# Patient Record
Sex: Female | Born: 1935
Health system: Southern US, Community
[De-identification: ages and names within clinical notes are randomized; demographics above are authoritative.]

## PROBLEM LIST (undated history)

## (undated) DIAGNOSIS — K589 Irritable bowel syndrome without diarrhea: Secondary | ICD-10-CM

## (undated) DIAGNOSIS — N3281 Overactive bladder: Secondary | ICD-10-CM

## (undated) DIAGNOSIS — E785 Hyperlipidemia, unspecified: Secondary | ICD-10-CM

## (undated) DIAGNOSIS — J449 Chronic obstructive pulmonary disease, unspecified: Secondary | ICD-10-CM

## (undated) DIAGNOSIS — M5440 Lumbago with sciatica, unspecified side: Secondary | ICD-10-CM

## (undated) DIAGNOSIS — J189 Pneumonia, unspecified organism: Secondary | ICD-10-CM

## (undated) DIAGNOSIS — F419 Anxiety disorder, unspecified: Secondary | ICD-10-CM

## (undated) DIAGNOSIS — J8410 Pulmonary fibrosis, unspecified: Secondary | ICD-10-CM

## (undated) DIAGNOSIS — K219 Gastro-esophageal reflux disease without esophagitis: Secondary | ICD-10-CM

## (undated) DIAGNOSIS — I5032 Chronic diastolic (congestive) heart failure: Secondary | ICD-10-CM

## (undated) DIAGNOSIS — I509 Heart failure, unspecified: Secondary | ICD-10-CM

## (undated) HISTORY — DX: Overactive bladder: N32.81

## (undated) HISTORY — DX: Hyperlipidemia, unspecified: E78.5

## (undated) HISTORY — DX: Chronic diastolic (congestive) heart failure: I50.32

## (undated) HISTORY — DX: Lumbago with sciatica, unspecified side: M54.40

## (undated) HISTORY — DX: Chronic obstructive pulmonary disease, unspecified: J44.9

---
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
## 1999-01-05 DIAGNOSIS — Z8701 Personal history of pneumonia (recurrent): Secondary | ICD-10-CM | POA: Insufficient documentation

## 1999-01-05 DIAGNOSIS — E782 Mixed hyperlipidemia: Secondary | ICD-10-CM | POA: Insufficient documentation

## 2003-03-22 ENCOUNTER — Emergency Department (HOSPITAL_COMMUNITY): Admission: EM | Admit: 2003-03-22 | Discharge: 2003-03-23 | Payer: Self-pay | Admitting: Emergency Medicine

## 2003-03-28 ENCOUNTER — Ambulatory Visit (HOSPITAL_COMMUNITY): Admission: RE | Admit: 2003-03-28 | Discharge: 2003-03-28 | Payer: Self-pay | Admitting: Urology

## 2003-07-09 ENCOUNTER — Ambulatory Visit (HOSPITAL_COMMUNITY): Admission: RE | Admit: 2003-07-09 | Discharge: 2003-07-09 | Payer: Self-pay | Admitting: Urology

## 2004-07-18 ENCOUNTER — Emergency Department (HOSPITAL_COMMUNITY): Admission: EM | Admit: 2004-07-18 | Discharge: 2004-07-19 | Payer: Self-pay | Admitting: *Deleted

## 2006-01-12 ENCOUNTER — Emergency Department (HOSPITAL_COMMUNITY): Admission: EM | Admit: 2006-01-12 | Discharge: 2006-01-12 | Payer: Self-pay | Admitting: Emergency Medicine

## 2007-09-15 ENCOUNTER — Emergency Department (HOSPITAL_COMMUNITY): Admission: EM | Admit: 2007-09-15 | Discharge: 2007-09-15 | Payer: Self-pay | Admitting: Emergency Medicine

## 2008-02-12 ENCOUNTER — Ambulatory Visit (HOSPITAL_COMMUNITY): Admission: RE | Admit: 2008-02-12 | Discharge: 2008-02-12 | Payer: Self-pay | Admitting: Urology

## 2008-07-14 ENCOUNTER — Inpatient Hospital Stay (HOSPITAL_COMMUNITY): Admission: EM | Admit: 2008-07-14 | Discharge: 2008-07-16 | Payer: Self-pay | Admitting: Emergency Medicine

## 2008-07-14 ENCOUNTER — Ambulatory Visit: Payer: Self-pay | Admitting: Cardiology

## 2008-07-15 ENCOUNTER — Encounter (INDEPENDENT_AMBULATORY_CARE_PROVIDER_SITE_OTHER): Payer: Self-pay | Admitting: Internal Medicine

## 2008-08-22 ENCOUNTER — Ambulatory Visit (HOSPITAL_COMMUNITY): Admission: RE | Admit: 2008-08-22 | Discharge: 2008-08-22 | Payer: Self-pay | Admitting: Urology

## 2008-09-02 ENCOUNTER — Ambulatory Visit (HOSPITAL_COMMUNITY): Admission: RE | Admit: 2008-09-02 | Discharge: 2008-09-02 | Payer: Self-pay | Admitting: Pulmonary Disease

## 2008-12-02 ENCOUNTER — Ambulatory Visit (HOSPITAL_COMMUNITY): Admission: RE | Admit: 2008-12-02 | Discharge: 2008-12-02 | Payer: Self-pay | Admitting: Pulmonary Disease

## 2009-02-28 ENCOUNTER — Emergency Department (HOSPITAL_COMMUNITY): Admission: EM | Admit: 2009-02-28 | Discharge: 2009-02-28 | Payer: Self-pay | Admitting: Emergency Medicine

## 2009-03-23 ENCOUNTER — Emergency Department (HOSPITAL_COMMUNITY): Admission: EM | Admit: 2009-03-23 | Discharge: 2009-03-23 | Payer: Self-pay | Admitting: Emergency Medicine

## 2009-04-07 ENCOUNTER — Ambulatory Visit (HOSPITAL_COMMUNITY): Admission: RE | Admit: 2009-04-07 | Discharge: 2009-04-07 | Payer: Self-pay | Admitting: Pulmonary Disease

## 2009-04-12 ENCOUNTER — Inpatient Hospital Stay (HOSPITAL_COMMUNITY): Admission: EM | Admit: 2009-04-12 | Discharge: 2009-04-14 | Payer: Self-pay | Admitting: Emergency Medicine

## 2009-05-28 ENCOUNTER — Emergency Department (HOSPITAL_COMMUNITY): Admission: EM | Admit: 2009-05-28 | Discharge: 2009-05-28 | Payer: Self-pay | Admitting: Emergency Medicine

## 2009-09-26 ENCOUNTER — Emergency Department (HOSPITAL_COMMUNITY): Admission: EM | Admit: 2009-09-26 | Discharge: 2009-09-26 | Payer: Self-pay | Admitting: Emergency Medicine

## 2009-10-04 HISTORY — PX: COLONOSCOPY: SHX174

## 2009-10-09 ENCOUNTER — Ambulatory Visit: Payer: Self-pay | Admitting: Internal Medicine

## 2009-10-09 ENCOUNTER — Encounter (INDEPENDENT_AMBULATORY_CARE_PROVIDER_SITE_OTHER): Payer: Self-pay

## 2009-10-09 DIAGNOSIS — J40 Bronchitis, not specified as acute or chronic: Secondary | ICD-10-CM | POA: Insufficient documentation

## 2009-10-09 DIAGNOSIS — E785 Hyperlipidemia, unspecified: Secondary | ICD-10-CM

## 2009-10-09 DIAGNOSIS — M810 Age-related osteoporosis without current pathological fracture: Secondary | ICD-10-CM | POA: Insufficient documentation

## 2009-10-14 ENCOUNTER — Encounter: Payer: Self-pay | Admitting: Internal Medicine

## 2009-10-16 DIAGNOSIS — R1032 Left lower quadrant pain: Secondary | ICD-10-CM | POA: Insufficient documentation

## 2009-11-03 ENCOUNTER — Ambulatory Visit: Payer: Self-pay | Admitting: Internal Medicine

## 2009-11-03 ENCOUNTER — Ambulatory Visit (HOSPITAL_COMMUNITY): Admission: RE | Admit: 2009-11-03 | Discharge: 2009-11-03 | Payer: Self-pay | Admitting: Internal Medicine

## 2009-11-08 ENCOUNTER — Encounter: Payer: Self-pay | Admitting: Internal Medicine

## 2009-12-08 ENCOUNTER — Ambulatory Visit (HOSPITAL_COMMUNITY)
Admission: RE | Admit: 2009-12-08 | Discharge: 2009-12-08 | Payer: Self-pay | Source: Home / Self Care | Admitting: Pulmonary Disease

## 2010-01-25 ENCOUNTER — Encounter: Payer: Self-pay | Admitting: Emergency Medicine

## 2010-02-03 NOTE — Assessment & Plan Note (Signed)
Summary: ABD PAIN,HEPATITIS,CIRRHOSIS/SS   Visit Type:  Initial Consult Primary Care Eldredge Veldhuizen:  hawkins  Chief Complaint:  abd pain.  History of Present Illness: 76 y/o female with recent  bout of L sided abdomional pain. Seen in ED . Fairly extensive evaluation including CT. Nothing to explain symptpms found. Diverticulosis on CT. Asymmetrical thickening of cecum on CT. Hx of colonic polyps.  She did have renal cysts and a large hiatal hernia.  Now back to baseline. Personal hx of colonic polyps and a markedd positive family history of CRC in 3 first degree relatives. Last TCS 2007 - polyps - Was to have a repeat in 2010 but apparently not done.  Preventive Screening-Counseling & Management  Alcohol-Tobacco     Smoking Status: never  Current Problems (verified): 1)  Hyperlipidemia  (ICD-272.4) 2)  Osteoporosis  (ICD-733.00) 3)  Bronchitis  (ICD-490)  Current Medications (verified): 1)  Prednisone 5 Mg Tabs (Prednisone) .... 2 Once Daily 2)  Tramadol Hcl 50 Mg Tabs (Tramadol Hcl) .... As Needed 3)  Cetirizine Hcl 10 Mg Tabs (Cetirizine Hcl) .... Once Daily 4)  Simvastatin 40 Mg Tabs (Simvastatin) .... At Bedtime  Allergies (verified): 1)  ! Sulfa 2)  ! Codeine  Past History:  Past Medical History: osteoporosis bronchitis hyperlipidemia  Past Surgical History: none  Family History: Father: deceased Mother: deceased Siblings: 1 brother, 3 sisters No FH of Colon Cancer:  Social History: Marital Status: Married Children: 3 Occupation: retired Patient has never smoked.  Alcohol Use - no Smoking Status:  never  Physical Exam  General:  Well developed, well nourished, no acute distress. Lungs:  Clear throughout to auscultation. Heart:  Regular rate and rhythm; no murmurs, rubs,  or bruits. Abdomen:  nondistended positive bowel sounds soft nontender without appreciable mass or organomegaly Rectal:  deferred until time of colonoscopy  Impression &  Recommendations: Impression:  Recent acute, apparently self-limitiing illness as described above. no evidence for diverticulitis on recent CT. She's now back to baseline. Abnormality in cecum on CT. Personal history of colonic adenoma and srikijg positive family history of CRC.  Recommendations:  Diagnostic colonoscopy in the near future. Discuss the risks, benefits, limitations, alternatives and imponderables. Her questions were answered. She is agreeable.  Further recommendations to follow.  Appended Document: Orders Update    Clinical Lists Changes  Problems: Added new problem of ABDOMINAL PAIN, LEFT LOWER QUADRANT (ICD-789.04) Orders: Added new Service order of New Patient Level IV ([address]) - Signed

## 2010-02-03 NOTE — Letter (Signed)
Summary: TRIAGE ORDER  TRIAGE ORDER   Imported By: Ave Filter 10/14/2009 16:24:16  _____________________________________________________________________  External Attachment:    Type:   Image     Comment:   External Document

## 2010-02-03 NOTE — Miscellaneous (Signed)
Summary: ct, cmp, chest xray from [facility]  Clinical Lists Changes CT Abd/Pelvis W CM - STATUS: Final  IMAGE                                     Perform Date: 23Sep11 13:14  Ordered ByDeretha Emory MD , SCOTT          Ordered Date: 23Sep11 09:06  Facility: [facility]                               Department: CT  Service Report Text  [facility] Accession Number: [id number]      Clinical Data: Left sided abdominal pain    CT ABDOMEN AND PELVIS WITH CONTRAST    Technique:  Multidetector CT imaging of the abdomen and pelvis was   performed following the standard protocol during bolus   administration of intravenous contrast.    Contrast: 100 ml of Omnipaque-300    Comparison: 04/12/2009    Findings: Increased reticular markings are seen in the lingula and   right middle lobes.  The lung bases are otherwise clear.  The heart   is normal in size.    There is a 3 mm hypoattenuating lesion in the right hepatic lobe   which is too small to characterize although, likely benign.  This   gallbladder, spleen, adrenal glands and pancreas are within normal   limits.  The kidneys enhance symmetrically and there are several   sub centimeter hypoattenuating lesions bilaterally.  The largest is   located in the lower pole right kidney measuring 7 mm and   compatible with a simple cyst.  The remainder too small to   characterize.  Again seen is a large hiatal hernia.  There is no   small bowel obstruction. The appendix is normal.  There is mild   asymmetric bowel wall thickening seen in the cecum at the base of   the appendix of uncertain significance. There is diverticulosis of   the sigmoid colon without diverticulitis.    The uterus and ovaries are within normal limits.  The bladder is   unremarkable.  No free fluid is seen in the abdomen or pelvis.   There is no lymphadenopathy.  The aorta major branch vessels are   patent.    The bones are osteopenic.  No aggressive lytic or blastic bone   lesions are  seen.    IMPRESSION:    1.  No acute findings in the abdomen or pelvis.  There is   diverticulosis of the sigmoid colon without radiographic evidence   of diverticulitis.   2.  Low attenuation lesions in the kidneys, bilaterally the largest   of which are compatible with simple cyst in the smallest are too   small to characterize.   3.  Large hiatal hernia.   4.  Asymmetric thickening of the cecum at the base of the appendix.   No inflammatory changes are seen in the appendix.  Correlation   should be made with colonoscopy as this may relate to peristalsis   but underlying malignancy cannot be excluded.    Read By:  Henrene Pastor,  M.D.   Released By:  Henrene Pastor,  M.D.  Additional Information  HL7 RESULT STATUS : F  External image : [id number]  External IF Update Timestamp : 2009-09-26:13:27:30.000000    DG  Chest 2 View - STATUS: Final  IMAGE                                     Perform Date: 23Sep11 11:19  Ordered ByDeretha Emory MD , SCOTT          Ordered Date: 23Sep11 09:06  Facility: [facility]                               Department: DG  Service Report Text  [facility] Accession Number: [id number]      Clinical Data: Left-sided chest pain.    CHEST - 2 VIEW    Comparison: 04/12/2009    Findings: Trachea is midline.  Heart size normal.  Large hiatal   hernia.  Biapical pleural parenchymal scarring.  Mild chronic   changes are seen in the lungs.  No air space consolidation or   pleural fluid.    IMPRESSION:    1.  No acute findings.   2.  Large hiatal hernia.    Read By:  Reyes Ivan.,  M.D.   Released By:  Reyes Ivan.,  M.D.  Additional Information  HL7 RESULT STATUS : F  External image : [id number]  External IF Update Timestamp : 2009-09-26:11:24:04.000000     L-CMP (Comprehensive Metabolic Pnl-CMET) - STATUS: Final                                            Perform Date: 23Sep11 09:05  Ordered ByDeretha Emory MD , SCOTT           Ordered Date: 23Sep11 09:06                                       Last Updated Date: 23Sep11 09:42  Facility: [facility]                               Department: GENL  Accession #: [id number]                    USN:       [id number]  Findings  Result Name                              Result     Abnl   Normal Range     Units      Perf. Loc.  Sodium (NA)                              140               135-145          mEq/L  Potassium (K)                            3.6               3.5-5.1          mEq/L  Chloride  104               96-112           mEq/L  CO2                                           28                19-32            mEq/L  Glucose                                     93                70-99            mg/dL  BUN                                           8                 6-23             mg/dL  Creatinine                                   0.69              0.4-1.2          mg/dL  GFR, Est Non African American            >60               >60              mL/min  GFR, Est African American                >60               >60              mL/min    Oversized comment, see footnote  1  Bilirubin, Total                            0.4               0.3-1.2          mg/dL  Alkaline Phosphatase                  67                39-117           U/L  SGOT (AST)                               30                0-37             U/L  SGPT (ALT)  19                0-35             U/L  Total  Protein                             6.7               6.0-8.3          g/dL  Albumin-Blood                           4.2               3.5-5.2          g/dL  Calcium                                    9.3               8.4-10.5         mg/dL  Footnotes  1. The eGFR has been calculated     using the MDRD equation.     This calculation has not been     validated in all clinical     situations.     eGFR's  persistently     <60 mL/min signify     possible Chronic Kidney Disease.  Additional Information  HL7 RESULT STATUS : F  External IF Update Timestamp : 2009-09-26:09:38:00.000000

## 2010-02-03 NOTE — Letter (Signed)
Summary: Patient Notice, Colon Biopsy Results  [facility]  [address]   [place], [place] [address]   Phone: [phone]  Fax: [phone]       November 08, 2009   [name] [address] [place], [place]  [address] [birth year]    Dear Ms. Ganus,  I am pleased to inform you that the biopsies taken during your recent colonoscopy did not show any evidence of cancer upon pathologic examination.  Additional information/recommendations:  No further action is needed at this time.  Please follow-up with your primary care physician for your other healthcare needs.  You should have a repeat colonoscopy examination  in 2 years.  Please call us if you are having persistent problems or have questions about your condition that have not been fully answered at this time.  Sincerely,    R. Roetta Sessions MD, FACP [facility] Ph: [phone]    Fax: [phone]   Appended Document: Patient Notice, Colon Biopsy Results letter mailed to pt  Appended Document: Patient Notice, Colon Biopsy Results reminder in computer

## 2010-03-19 LAB — COMPREHENSIVE METABOLIC PANEL
ALT: 19 U/L (ref 0–35)
AST: 30 U/L (ref 0–37)
CO2: 28 mEq/L (ref 19–32)
Calcium: 9.3 mg/dL (ref 8.4–10.5)
GFR calc Af Amer: >60 mL/min (ref >60)
GFR calc non Af Amer: >60 mL/min (ref >60)
Sodium: 140 mEq/L (ref 135–145)
Total Protein: 6.7 g/dL (ref 6.0–8.3)

## 2010-03-19 LAB — POCT CARDIAC MARKERS: Troponin i, poc: <0.05 ng/mL (ref 0.00–0.09)

## 2010-03-23 LAB — COMPREHENSIVE METABOLIC PANEL
ALT: 16 U/L (ref 0–35)
Alkaline Phosphatase: 104 U/L (ref 39–117)
CO2: 29 mEq/L (ref 19–32)
GFR calc non Af Amer: >60 mL/min (ref >60)
Glucose, Bld: 100 mg/dL — ABNORMAL HIGH (ref 70–99)
Potassium: 3.3 mEq/L — ABNORMAL LOW (ref 3.5–5.1)
Sodium: 138 mEq/L (ref 135–145)

## 2010-03-23 LAB — CBC
Hemoglobin: 13 g/dL (ref 12.0–15.0)
RBC: 4.41 MIL/uL (ref 3.87–5.11)
WBC: 9 10*3/uL (ref 4.0–10.5)

## 2010-03-23 LAB — URINALYSIS, ROUTINE W REFLEX MICROSCOPIC
Nitrite: NEGATIVE
Specific Gravity, Urine: 1.005 (ref 1.005–1.030)
pH: 6.5 (ref 5.0–8.0)

## 2010-03-23 LAB — DIFFERENTIAL
Basophils Relative: 1 % (ref 0–1)
Eosinophils Absolute: 0.1 10*3/uL (ref 0.0–0.7)
Eosinophils Relative: 1 % (ref 0–5)
Monocytes Relative: 13 % — ABNORMAL HIGH (ref 3–12)
Neutrophils Relative %: 61 % (ref 43–77)

## 2010-03-23 LAB — POCT CARDIAC MARKERS

## 2010-03-23 LAB — LIPASE, BLOOD: Lipase: 36 U/L (ref 11–59)

## 2010-03-25 LAB — CBC
HCT: 33.8 % — ABNORMAL LOW (ref 36.0–46.0)
HCT: 36.3 % (ref 36.0–46.0)
Hemoglobin: 12.6 g/dL (ref 12.0–15.0)
MCHC: 34.3 g/dL (ref 30.0–36.0)
MCV: 85.1 fL (ref 78.0–100.0)
MCV: 86.1 fL (ref 78.0–100.0)
Platelets: 173 10*3/uL (ref 150–400)
Platelets: 206 10*3/uL (ref 150–400)
RBC: 3.93 MIL/uL (ref 3.87–5.11)
RDW: 13.4 % (ref 11.5–15.5)
WBC: 13.6 10*3/uL — ABNORMAL HIGH (ref 4.0–10.5)
WBC: 14.4 10*3/uL — ABNORMAL HIGH (ref 4.0–10.5)

## 2010-03-25 LAB — DIFFERENTIAL
Basophils Absolute: 0 10*3/uL (ref 0.0–0.1)
Basophils Absolute: 0 10*3/uL (ref 0.0–0.1)
Basophils Absolute: 0 10*3/uL (ref 0.0–0.1)
Basophils Relative: 0 % (ref 0–1)
Basophils Relative: 0 % (ref 0–1)
Basophils Relative: 0 % (ref 0–1)
Eosinophils Absolute: 0 10*3/uL (ref 0.0–0.7)
Lymphocytes Relative: 14 % (ref 12–46)
Lymphocytes Relative: 8 % — ABNORMAL LOW (ref 12–46)
Monocytes Absolute: 0.8 10*3/uL (ref 0.1–1.0)
Monocytes Relative: 6 % (ref 3–12)
Neutro Abs: 12.3 10*3/uL — ABNORMAL HIGH (ref 1.7–7.7)
Neutro Abs: 9.3 10*3/uL — ABNORMAL HIGH (ref 1.7–7.7)
Neutrophils Relative %: 77 % (ref 43–77)
Neutrophils Relative %: 86 % — ABNORMAL HIGH (ref 43–77)
Neutrophils Relative %: 90 % — ABNORMAL HIGH (ref 43–77)

## 2010-03-25 LAB — URINE CULTURE

## 2010-03-25 LAB — URINALYSIS, ROUTINE W REFLEX MICROSCOPIC
Glucose, UA: NEGATIVE mg/dL
Specific Gravity, Urine: 1.015 (ref 1.005–1.030)

## 2010-03-25 LAB — COMPREHENSIVE METABOLIC PANEL
Albumin: 3.7 g/dL (ref 3.5–5.2)
Alkaline Phosphatase: 120 U/L — ABNORMAL HIGH (ref 39–117)
BUN: 14 mg/dL (ref 6–23)
CO2: 26 mEq/L (ref 19–32)
Chloride: 102 mEq/L (ref 96–112)
Creatinine, Ser: 0.79 mg/dL (ref 0.4–1.2)
GFR calc non Af Amer: >60 mL/min (ref >60)
Glucose, Bld: 143 mg/dL — ABNORMAL HIGH (ref 70–99)
Total Bilirubin: 0.6 mg/dL (ref 0.3–1.2)

## 2010-03-25 LAB — URINE MICROSCOPIC-ADD ON

## 2010-03-25 LAB — PROTIME-INR
INR: 1.03 (ref 0.00–1.49)
Prothrombin Time: 13.4 seconds (ref 11.6–15.2)

## 2010-03-25 LAB — APTT: aPTT: 24 seconds (ref 24–37)

## 2010-03-25 LAB — LIPASE, BLOOD: Lipase: 50 U/L (ref 11–59)

## 2010-03-25 LAB — LACTIC ACID, PLASMA: Lactic Acid, Venous: 2.6 mmol/L — ABNORMAL HIGH (ref 0.5–2.2)

## 2010-03-29 LAB — GLUCOSE, CAPILLARY: Glucose-Capillary: 123 mg/dL — ABNORMAL HIGH (ref 70–99)

## 2010-04-12 LAB — POCT CARDIAC MARKERS: Myoglobin, poc: 45.2 ng/mL (ref 12–200)

## 2010-04-12 LAB — CBC
HCT: 35.6 % — ABNORMAL LOW (ref 36.0–46.0)
MCHC: 34.6 g/dL (ref 30.0–36.0)
MCHC: 34.8 g/dL (ref 30.0–36.0)
MCV: 85.7 fL (ref 78.0–100.0)
Platelets: 234 10*3/uL (ref 150–400)
RBC: 4.29 MIL/uL (ref 3.87–5.11)
RDW: 13.8 % (ref 11.5–15.5)
RDW: 13.9 % (ref 11.5–15.5)
RDW: 14 % (ref 11.5–15.5)
WBC: 5.5 10*3/uL (ref 4.0–10.5)

## 2010-04-12 LAB — BASIC METABOLIC PANEL
BUN: 7 mg/dL (ref 6–23)
BUN: 7 mg/dL (ref 6–23)
CO2: 28 mEq/L (ref 19–32)
Calcium: 9.1 mg/dL (ref 8.4–10.5)
Chloride: 103 mEq/L (ref 96–112)
Creatinine, Ser: 0.63 mg/dL (ref 0.4–1.2)
Creatinine, Ser: 0.82 mg/dL (ref 0.4–1.2)
GFR calc Af Amer: >60 mL/min (ref >60)
GFR calc non Af Amer: >60 mL/min (ref >60)
Glucose, Bld: 115 mg/dL — ABNORMAL HIGH (ref 70–99)
Potassium: 4.1 mEq/L (ref 3.5–5.1)

## 2010-04-12 LAB — BLOOD GAS, ARTERIAL
FIO2: 0.21 %
O2 Saturation: 88.8 %
Patient temperature: 37

## 2010-04-12 LAB — GLUCOSE, CAPILLARY

## 2010-04-12 LAB — URINALYSIS, ROUTINE W REFLEX MICROSCOPIC
Ketones, ur: NEGATIVE mg/dL
Leukocytes, UA: NEGATIVE
Nitrite: POSITIVE — AB
Protein, ur: NEGATIVE mg/dL
Urobilinogen, UA: 0.2 mg/dL (ref 0.0–1.0)

## 2010-04-12 LAB — BRAIN NATRIURETIC PEPTIDE: Pro B Natriuretic peptide (BNP): <30 pg/mL (ref 0.0–100.0)

## 2010-04-12 LAB — DIFFERENTIAL
Basophils Absolute: 0 10*3/uL (ref 0.0–0.1)
Basophils Relative: 0 % (ref 0–1)
Eosinophils Absolute: 0.2 10*3/uL (ref 0.0–0.7)
Eosinophils Relative: 0 % (ref 0–5)
Eosinophils Relative: 2 % (ref 0–5)
Lymphocytes Relative: 12 % (ref 12–46)
Lymphocytes Relative: 16 % (ref 12–46)
Lymphs Abs: 0.7 10*3/uL (ref 0.7–4.0)
Lymphs Abs: 1.3 10*3/uL (ref 0.7–4.0)
Monocytes Absolute: 0.1 10*3/uL (ref 0.1–1.0)
Monocytes Relative: 1 % — ABNORMAL LOW (ref 3–12)
Monocytes Relative: 12 % (ref 3–12)
Neutro Abs: 4.7 10*3/uL (ref 1.7–7.7)
Neutro Abs: 5.4 10*3/uL (ref 1.7–7.7)
Neutrophils Relative %: 68 % (ref 43–77)

## 2010-04-12 LAB — VITAMIN B12: Vitamin B-12: 226 pg/mL (ref 211–911)

## 2010-04-12 LAB — C-REACTIVE PROTEIN: CRP: 1 mg/dL — ABNORMAL HIGH (ref <0.6)

## 2010-04-12 LAB — CULTURE, BLOOD (ROUTINE X 2)
Culture: NO GROWTH
Culture: NO GROWTH
Report Status: 7162010

## 2010-04-12 LAB — URINE CULTURE: Colony Count: >=100000

## 2010-04-12 LAB — COMPREHENSIVE METABOLIC PANEL
ALT: 12 U/L (ref 0–35)
AST: 25 U/L (ref 0–37)
Calcium: 8.7 mg/dL (ref 8.4–10.5)
GFR calc Af Amer: >60 mL/min (ref >60)
Glucose, Bld: 95 mg/dL (ref 70–99)
Sodium: 139 mEq/L (ref 135–145)
Total Protein: 6 g/dL (ref 6.0–8.3)

## 2010-04-12 LAB — IRON AND TIBC
Saturation Ratios: 11 % — ABNORMAL LOW (ref 20–55)
UIBC: 269 ug/dL

## 2010-04-12 LAB — RETICULOCYTES
RBC.: 4.13 MIL/uL (ref 3.87–5.11)
Retic Ct Pct: 0.8 % (ref 0.4–3.1)

## 2010-04-12 LAB — FOLATE: Folate: 8.9 ng/mL

## 2010-04-12 LAB — SEDIMENTATION RATE: Sed Rate: 36 mm/hr — ABNORMAL HIGH (ref 0–22)

## 2010-04-12 LAB — FERRITIN: Ferritin: 70 ng/mL (ref 10–291)

## 2010-05-19 NOTE — Group Therapy Note (Signed)
[name], [name]                 ACCOUNT NO.:  [id number]   MEDICAL RECORD NO.:  [id number]          PATIENT TYPE:  INP   LOCATION:  A315                          FACILITY:  [facility]   PHYSICIAN:  Osvaldo Shipper, MD     DATE OF BIRTH:  02-11-1934   DATE OF PROCEDURE:  07/15/2008  DATE OF DISCHARGE:                                 PROGRESS NOTE   SUBJECTIVE:  The patient is feeling better.  She still has a dry cough.  No chest pain, no nausea or vomiting.  Shortness of breath is improving.   OBJECTIVE:  Temperature 98.2, blood pressure 98/60, heart rate 87,  respiratory rate 20, saturation 95% on 2 liters, 88% on room air at  rest.  GENERAL:  This is a thin white female in no distress, pleasant,  talkative.  HEENT:  No pallor, no icterus.  Mucous membranes moist.  NECK:  Soft and supple.  No thyromegaly is appreciated.  No  lymphadenopathy is noted.  LUNGS:  Coarse crackles bilaterally half-way up the lung fields.  No  wheezing is appreciated.  CARDIOVASCULAR:  S1 and S2 is normal, regular.  No murmurs.  No rubs, no  bruits.  No pedal edema.  ABDOMEN:  Soft, nontender, nondistended.  Bowel sounds present.  No  masses or organomegaly are appreciated.   LABORATORY DATA:  Her white count is normal, hemoglobin 11.4, MCV 86,  platelet count is normal.  ESR is 36.  LFTs are normal.  Albumin is 2.8.  ABG done on room air yesterday showed a pH of 7.41, PCO2 is 36, PO2 is  55, bicarb is 23, saturation 88%.   IMAGING STUDIES:  CT of the chest is pending.  Pulmonary function tests  results are pending.   ASSESSMENT AND PLAN:  1. Shortness of breath secondary to pulmonary fibrosis/interstitial      lung disease.  The etiology for this is likely the nitrofurantoin      that she has been taking for the past 2 months.  She has been seen      by Dr. Juanetta Gosling.  She has been started on Solu-Medrol by Dr.      Juanetta Gosling.  A CT chest is pending.  Pulmonary function tests have      been done, but the  results are pending.  Room air sats at rest and      with ambulation will be checked, as it is quite likely that this      patient will need home O2.  2. History of bronchial asthma.  Continue with Flovent and Singulair      and nebulizer treatment every 6 hours.  3. She is on deep venous thrombosis prophylaxis.  4. Urinary tract infection.  Treat with Levaquin.  5. Presumed pneumonia.  Treat with Levaquin.  6. Cardiomegaly.  Echocardiogram is pending.   So we will await the results of all of these tests.  We will continue  with current treatment strategy.  I am hoping this patient will be able  to go home in the next day or two.  Osvaldo Shipper, MD  Electronically Signed     GK/MEDQ  D:  07/15/2008  T:  07/15/2008  Job:  [id number]   cc:   Ramon Dredge L. Juanetta Gosling, M.D.  Fax: [phone]   [facility]

## 2010-05-19 NOTE — Consult Note (Signed)
NAMEKINSLIE, HOVE                 ACCOUNT NO.:  [id number]   MEDICAL RECORD NO.:  [id number]          PATIENT TYPE:  INP   LOCATION:  A315                          [facility]ILITY[facility]  [facility]   PHYSICIAN:  Edward L. Juanetta Gosling, M.D.DATE OF BIRTH:  1934-09-08   DATE OF CONSULTATION:  DATE OF DISCHARGE:                                 CONSULTATION   Ms. [name] is admitted with a history of asthma.  She has become much  more short of breath in the last 2-3 days and eventually came to the  emergency room.  When she was seen in the emergency room, she was noted  to have abnormalities on her chest x-ray that are new from a previous  chest x-ray.  She had been started on nitrofurantoin about 2 months ago.  At home, she takes Zyrtec 10 mg daily.  She has been taking  nitrofurantoin, Singulair 10 mg daily, Flovent 110 twice daily, and then  Ventolin as needed.   PAST MEDICAL HISTORY:  She has had asthma, and that is, basically, her  only medical problem.   SOCIAL HISTORY:  She does not use alcohol or tobacco or illicit drugs.  She lives at home with family.   REVIEW OF SYSTEMS:  Except as mentioned, she has had some cough.   FAMILY HISTORY:  Not positive for any lung disease as far as she knows.   PHYSICAL EXAMINATION:  GENERAL:  She is a well-developed, well-nourished  female who does not appear to be in any acute distress.  VITAL SIGNS:  Temp is 98.2, pulse 87, respirations 24, blood pressure  94/65.  O2 sat is 95% on 2 L.  CHEST:  Clearer.  HEART:  Regular.  ABDOMEN:  Soft.  EXTREMITIES:  No edema.   LABORATORY DATA:  Comp metabolic profile shows albumin of 2.8, white  count 7,900.  Hemoglobin is 11.4, platelets 187.  Blood gas on room air,  pH 7.41, pCO2 of 36, and pO2 of 55.   Chest x-ray shows underlying interstitial lung disease, which is  different than an x-ray in 2008.   ASSESSMENT:  I think that Dr. 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  I agree with  plans for CT of the  chest.  He and I discussed steroids, and I think we should go ahead and  do that.     Edward L. Juanetta Gosling, M.D.  Electronically Signed    ELH/MEDQ  D:  07/15/2008  T:  07/16/2008  Job:  [id number]

## 2010-05-19 NOTE — Group Therapy Note (Signed)
NAMEZYRA, PARRILLO                 ACCOUNT NO.:  [id number]   MEDICAL RECORD NO.:  [id number]          PATIENT TYPE:  INP   LOCATION:  A315                          [facility]ACILITY:  [facility]   PHYSICIAN:  Edward L. Juanetta Gosling, M.D.DATE OF BIRTH:  [birth year]   DATE OF PROCEDURE:  DATE OF DISCHARGE:  07/16/2008                                 PROGRESS NOTE   A patient of the hospitalist.  Ms. Talamante has been admitted with what  appears to be Macrodantin lung injury.  She is on steroids.  I have  discussed all that with Dr. Rito Ehrlich, the hospitalist attending, and we  know that there is not a great deal of evidence about how well that  works, but it is available and she is early in the course of this, we  think, with being on the Macrodantin for about 2 months, so our hope is  that she will have a reversible situation.  Dr. Rito Ehrlich plans to send  her home today, and I have discussed that with him and my plan then  would be to have him put her on 40 mg of prednisone daily for about 2  weeks, then 30 for about 2 weeks, and then return and follow up in my  office if that is okay with her, at which point we will do another chest  x-ray and see if things look better.      Edward L. Juanetta Gosling, M.D.  Electronically Signed     ELH/MEDQ  D:  07/16/2008  T:  07/17/2008  Job:  [id number]

## 2010-05-19 NOTE — H&P (Signed)
NAMEMINDEL, FRISCIA                 ACCOUNT NO.:  [id number]   MEDICAL RECORD NO.:  [id number]          PATIENT TYPE:  INP   LOCATION:  A315                          [facility]   PHYSICIAN:  Osvaldo Shipper, MD     DATE OF BIRTH:  06-Jun-1934   DATE OF ADMISSION:  07/14/2008  DATE OF DISCHARGE:  LH                              HISTORY & PHYSICAL   PRIMARY CARE PHYSICIAN:  Dr. Shelva Majestic in [facility] [place]  [place].   She has never seen a pulmonologist before.   ADMISSION DIAGNOSES:  1. Hypoxia.  2. Likely interstitial lung disease/pulmonary fibrosis due to      nitrofurantoin.  3. Possible pneumonia.  4. Urinary tract infection.  5. History of bronchial asthma.   CHIEF COMPLAINT:  Shortness of breath since yesterday.   HISTORY OF PRESENT ILLNESS:  The patient is a 76 year old Caucasian  female who has a past medical history of bronchial asthma who says she  has been short of breath for many, many years, but over the last couple  of days she has been feeling more short of breath, especially with  exertion, although the shortness of breath does persist even at rest.  She has a dry cough.  Denies any chest pain, fever, or chills.  No  nausea or vomiting.  No sick contacts.  No recent travel.  No leg  swelling.  She says that the only new medication was the nitrofurantoin  that she started approximately 2 months ago.   HOME MEDICATIONS:  1. Cetirizine 10 mg daily.  2. Nitrofurantoin 100 mg at bedtime.  3. Singulair 10 mg daily.  4. Flovent 110 mcg twice daily.  5. Ventolin 2 puffs inhaled as needed.   ALLERGIES:  SULFA and CODEINE.   PAST MEDICAL HISTORY:  1. Bronchial asthma for many years.  2. Otherwise denies any history of hypertension, diabetes, heart      disease, or stroke.   PAST SURGICAL HISTORY:  Denies any surgeries in the past.   SOCIAL HISTORY:  Lives in [place] with her husband.  Denies alcohol  use.  Denies illicit  drug use.  Denies smoking.  Independent with daily  activities.   FAMILY HISTORY:  Unremarkable per the patient.   REVIEW OF SYSTEMS:  GENERAL:  Positive for weakness, malaise.  HEENT:  Unremarkable.  CARDIOVASCULAR:  Unremarkable.  RESPIRATORY:  As in HPI.  GI:  Unremarkable.  GU:  Unremarkable.  NEUROLOGIC:  Unremarkable.  PSYCHIATRIC:  Unremarkable.  MUSCULOSKELETAL:  Unremarkable.  Other  systems are unremarkable.   PHYSICAL EXAMINATION:  VITAL SIGNS:  Temperature 97.7, blood pressure  initially was 129/75 dropping to 98/53 with heart rate maintaining in  the 80s.  Respiratory rate 20.  Saturation 99% on 2 liters and 84% on  room air.  GENERAL:  Thin white female in no distress.  HEENT:  Head is normocephalic and atraumatic.  No pallor or icterus.  Oral mucous membranes are moist.  Poor oral dentition is noted.  Multiple teeth are missing.  NECK:  Soft and supple.  No  thyromegaly is appreciated.  No  lymphadenopathy is noted.  LUNGS:  Coarse crackles bilaterally halfway up the lung fields.  No  wheezing is present.  CARDIOVASCULAR:  S1, S2 normal, regular.  No murmur is appreciated.  No  S3, S4.  No rubs and no bruits.  No edema.  Peripheral pulses are  palpable.  ABDOMEN:  Soft, nontender, nondistended.  Bowel sounds are present.  No  masses or organomegaly appreciated.  GU:  Deferred.  RECTAL:  Deferred.  MUSCULOSKELETAL:  Unremarkable.  NEUROLOGIC:  She is alert and oriented x3.  No focal neurological  deficits are present.   LABORATORY DATA:  CBC unremarkable.  BMET unremarkable.  Cardiac enzymes  negative.  BNP less than 30.  UA shows positive nitrite, many bacteria,  3-6 WBCs.   Chest x-ray was read as showing cardiomegaly with underlying chronic  interstitial changes.  Bibasilar interstitial opacities are noted, left  greater than right, and a degree of basilar pneumonia could not be  excluded.  Interestingly she had a chest x-ray in January 2009 in which  the  lungs were clear.   EKG shows sinus rhythm at a rate of 83 with a normal axis.  Intervals  appear to be in the normal range.  No Q-waves are noted.  No ST or T-  wave changes of concern are noted.   ASSESSMENT:  This is a 76 year old Caucasian female who presents with  shortness of breath for a few days duration who is found to have  interstitial changes on her chest x-ray, especially in the bases.  There  is a possibility of pneumonia also.  However, with a normal white count  I think that possibility is quite low.  She also has a urinary tract  infection.  I think most of her shortness of breath is secondary to  interstitial changes in her lungs, and this is probably caused by the  nitrofurantoin that she had been taking for the past 2 months for  urinary issues.   PLAN:  1. Dyspnea is likely due to pulmonary fibrosis/interstitial lung      disease secondary to nitrofurantoin.  We will admit the patient,      put her on nebulizer treatments, continue with inhaled steroids.  I      think we need to go ahead and get a CT chest without and with      contrast.  Check arterial blood gases, do a pulmonary function      test, and consult Dr. Juanetta Gosling to take a look at her.  She may      benefit from steroids; however, I will defer to him on this issue.      She is stable at this time.  2. Presumed pneumonia.  Will treat with Levaquin which will also treat      the urinary tract infection.  3. History of bronchial asthma.  Continue with Singulair.  4. Cardiomegaly.  We will also check an echocardiogram.  Her beta-      natriuretic peptide, however, was normal.  5. Deep venous thrombosis prophylaxis will be initiated.   Further management decisions will depend on the results of further  testing and the patient's response to treatment.      Osvaldo Shipper, MD  Electronically Signed     GK/MEDQ  D:  07/14/2008  T:  07/14/2008  Job:  [id number]   cc:   Dr. Juanetta Gosling   Dr. Claris Che  [facility]

## 2010-05-19 NOTE — Discharge Summary (Signed)
[name], [name]                 ACCOUNT NO.:  [id number]   MEDICAL RECORD NO.:  [id number]          PATIENT TYPE:  INP   LOCATION:  A315                          FACILITY:  [facility]   PHYSICIAN:  Osvaldo Shipper, MD     DATE OF BIRTH:  1934-11-13   DATE OF ADMISSION:  07/14/2008  DATE OF DISCHARGE:  07/13/2010LH                               DISCHARGE SUMMARY   PRIMARY CARE PHYSICIAN:  Minerva Fester, MD, [facility]   HISTORY/PHYSICAL:  Please review H and P dictated at the time of  admission for details regarding the patient's presenting illness.   DISCHARGE DIAGNOSES:  1. Pulmonary fibrosis likely secondary to nitrofurantoin.  2. Hypoxia, requiring home oxygen.  3. Urinary tract infection with Klebsiella sensitive to Levaquin.  4. History of bronchial asthma.   HOSPITAL COURSE:  1. Briefly, this is a 76 year old Caucasian female who apparently has      issues with chronic UTIs and was started on nitrofurantoin about 2      months ago.  She presented to the hospital because of progressively      worsening shortness of breath.  She was found to be extremely      hypoxic in the ED especially with exertion and her sats would drop      to 83.  The patient was admitted for further evaluation.  X-ray was      read as showing chronic interstitial changes.  However, an x-ray      done in 2008 did not show any kind of interstitial lung disease.      It was felt that she had developed nitrofurantoin-induced pulmonary      toxicity.  So we admitted the patient to the hospital.  We started      her on IV steroids and consulted Dr. Juanetta Gosling who agreed with the      assessment.  A CT chest was done which showed patchy ground-glass      opacities favoring the lower lobes and peripherally with      interspersed interstitial thickening.  Mild mediastinal adenopathy      was noted which was thought to be nonspecific, moderate size hiatal      hernia was seen and distal esophageal wall  thickening was noted.      The patient was put on O2 as well.  She has been feeling better.      She was given nebulizer treatments as well.  ABG was checked which      showed a pH of 7.41, PCO2 was normal, PO2 was 55, saturation 88%      this was on room air while sitting.  ESR was 36.  Rest of the labs      were pretty unremarkable.  BNP was less than 30.  C. reactive      protein was 1.0.  2. Urinary tract infection.  She was found to have positive nitrite      and many bacteria in her urine.  UA was done which showed      Klebsiella greater than 100,000  colonies sensitive to Levaquin and      she will be prescribed this for 5 more days.  3. She otherwise has bronchial asthma for which she can continue with      Singulair, Flovent and Ventolin inhaler as needed.   PHYSICAL EXAMINATION:  GENERAL:  On the day of discharge, the patient is  feeling better.  She is still short of breath with ambulation.  Sats  yesterday when they were checked were 86% on room air.  VITAL SIGNS:  Otherwise all stable.  LUNGS:  Reveal crackles in the lower part of the lungs, but they sound  better compared to a couple of days ago.  No wheezing is present.  CARDIOVASCULAR:  S1 and S2 normal, regular.  No murmurs appreciated.  No  S3-S4, no rubs, no bruits.  ABDOMEN:  Soft, nontender, nondistended.  Bowel sounds were present.  No  masses or organomegaly is appreciated.  EXTREMITIES:  No pedal edema is noted.   OTHER PERTINENT STUDIES:  1. Include an echocardiogram which showed a normal EF 60-65% with no      wall motion abnormalities.  Mild focal hypertrophy of the basilar      septum was noted.  2. She also had pulmonary function tests which has not been officially      read yet, but preliminarily it does show evidence for severe      obstructive disease and also there was a restrictive pattern noted.  3. Other studies include an anemia panel which is pending at this      time.   DISCHARGE  MEDICATIONS:  1. Levaquin 500 mg daily for 5 days.  2. Prednisone 60 mg daily for 2 weeks; followed by 40 mg daily for 2      weeks and then 20 mg daily until seen by Dr. Juanetta Gosling.  3. Continue with cetirizine 10 mg daily; Singulair 10 mg daily;      Flovent inhaled twice daily; Ventolin inhaler as needed.  4. She needs to discontinue nitrofurantoin, I have told her not to      take it ever again.   Home oxygen will be arranged.   FOLLOW UP:  1. Follow up with Dr. Juanetta Gosling 4 weeks.  2. PMD within a week.  3. She also is followed by Dr. Kristian Covey for unclear reasons and she      will continue to do that.   DIET:  As before.   PHYSICAL ACTIVITY:  Increase activity slowly.  Home health PT will be  arranged.  Home O2 will be arranged.   DIAGNOSTICS:  CT chest and chest x-ray as described above.  Other  studies include 2-D echo as described above.   CONSULTATIONS:  Edward L. Juanetta Gosling, M.D.   Total time of this encounter 35 minutes.      Osvaldo Shipper, MD  Electronically Signed    GK/MEDQ  D:  07/16/2008  T:  07/16/2008  Job:  [id number]   cc:   Ramon Dredge L. Juanetta Gosling, M.D.  Fax: [phone]   [facility]

## 2010-05-22 NOTE — Procedures (Signed)
[name], [name]                 ACCOUNT NO.:  [id number]   MEDICAL RECORD NO.:  [id number]          PATIENT TYPE:  INP   LOCATION:  A315                          FACILITY:  [facility]   PHYSICIAN:  Edward L. Juanetta Gosling, M.D.DATE OF BIRTH:  [birth year]   DATE OF PROCEDURE:  DATE OF DISCHARGE:  07/16/2008                            PULMONARY FUNCTION TEST   1. Spirometry shows a severe ventilatory defect with airflow      obstruction.  2. Lung volumes show a separate restrictive change.  3. There is no significant bronchodilator improvement.      Edward L. Juanetta Gosling, M.D.  Electronically Signed     ELH/MEDQ  D:  07/18/2008  T:  07/18/2008  Job:  [id number]   cc:   Osvaldo Shipper, MD

## 2011-01-19 ENCOUNTER — Emergency Department (HOSPITAL_COMMUNITY)
Admission: EM | Admit: 2011-01-19 | Discharge: 2011-01-19 | Disposition: A | Discharge status: Home / Self Care | Payer: Medicare Other | Attending: Emergency Medicine | Admitting: Emergency Medicine

## 2011-01-19 ENCOUNTER — Encounter (HOSPITAL_COMMUNITY): Payer: Self-pay | Admitting: *Deleted

## 2011-01-19 ENCOUNTER — Emergency Department (HOSPITAL_COMMUNITY): Payer: Medicare Other

## 2011-01-19 DIAGNOSIS — J984 Other disorders of lung: Secondary | ICD-10-CM | POA: Insufficient documentation

## 2011-01-19 DIAGNOSIS — K449 Diaphragmatic hernia without obstruction or gangrene: Secondary | ICD-10-CM | POA: Diagnosis not present

## 2011-01-19 DIAGNOSIS — K5289 Other specified noninfective gastroenteritis and colitis: Secondary | ICD-10-CM | POA: Diagnosis not present

## 2011-01-19 DIAGNOSIS — R197 Diarrhea, unspecified: Secondary | ICD-10-CM | POA: Diagnosis not present

## 2011-01-19 DIAGNOSIS — K529 Noninfective gastroenteritis and colitis, unspecified: Secondary | ICD-10-CM

## 2011-01-19 LAB — COMPREHENSIVE METABOLIC PANEL
Albumin: 4 g/dL (ref 3.5–5.2)
BUN: 8 mg/dL (ref 6–23)
CO2: 32 mEq/L (ref 19–32)
Calcium: 10 mg/dL (ref 8.4–10.5)
Chloride: 102 mEq/L (ref 96–112)
Creatinine, Ser: 0.64 mg/dL (ref 0.50–1.10)
GFR calc non Af Amer: 86 mL/min — ABNORMAL LOW (ref >90)
Total Bilirubin: 0.4 mg/dL (ref 0.3–1.2)

## 2011-01-19 LAB — DIFFERENTIAL
Basophils Relative: 0 % (ref 0–1)
Eosinophils Relative: 0 % (ref 0–5)
Monocytes Absolute: 1.1 10*3/uL — ABNORMAL HIGH (ref 0.1–1.0)
Monocytes Relative: 14 % — ABNORMAL HIGH (ref 3–12)
Neutro Abs: 5.2 10*3/uL (ref 1.7–7.7)

## 2011-01-19 LAB — CBC
HCT: 35.7 % — ABNORMAL LOW (ref 36.0–46.0)
Hemoglobin: 12 g/dL (ref 12.0–15.0)
MCHC: 33.6 g/dL (ref 30.0–36.0)
MCV: 85.6 fL (ref 78.0–100.0)

## 2011-01-19 MED ORDER — KETOROLAC TROMETHAMINE 30 MG/ML IJ SOLN
15.0000 mg | Freq: Once | INTRAMUSCULAR | Status: AC
  Administered 2011-01-19: 15 mg via INTRAVENOUS
  Filled 2011-01-19: qty 1

## 2011-01-19 MED ORDER — ONDANSETRON 4 MG PO TBDP: 4.0000 mg | ORAL_TABLET | Freq: Three times a day (TID) | ORAL | Status: AC | PRN

## 2011-01-19 MED ORDER — ONDANSETRON HCL 4 MG/2ML IJ SOLN
4.0000 mg | Freq: Once | INTRAMUSCULAR | Status: AC
  Administered 2011-01-19: 4 mg via INTRAVENOUS
  Filled 2011-01-19: qty 2

## 2011-01-19 MED ORDER — SODIUM CHLORIDE 0.9 % IV BOLUS (SEPSIS)
1000.0000 mL | Freq: Once | INTRAVENOUS | Status: AC
  Administered 2011-01-19: 1000 mL via INTRAVENOUS

## 2011-01-19 MED ORDER — OXYCODONE-ACETAMINOPHEN 5-325 MG PO TABS
1.0000 | ORAL_TABLET | Freq: Once | ORAL | Status: AC
  Administered 2011-01-19: 1 via ORAL
  Filled 2011-01-19: qty 1

## 2011-01-19 NOTE — ED Notes (Signed)
C/o n/v/d, right-sided HA pain onset yesterday. A&ox4; answers questions appropriately.

## 2011-01-19 NOTE — ED Notes (Signed)
Left in c/o family for transport home.  A&ox4; in no distress.

## 2011-01-19 NOTE — ED Notes (Signed)
C/o chills, HA and N/V since yesterday, admits to recent sick contacts as well

## 2011-01-19 NOTE — ED Notes (Signed)
Patient transported to X-ray 

## 2011-01-19 NOTE — ED Provider Notes (Signed)
This chart was scribed for Benny Lennert, MD by Williemae Natter. The patient was seen in room [facility]09[facility]09 at 12:47 PM.  CSN: [id number]  Arrival date & time 01/19/11  1216   First MD Initiated Contact with Patient 01/19/11 1241      Chief Complaint  Patient presents with  . Nausea  . Chills  . Emesis    (Consider location/radiation/quality/duration/timing/severity/associated sxs/prior treatment) Patient is a 77 y.o. female presenting with vomiting. The history is provided by the patient. No language interpreter was used.  Emesis  This is a new problem. The current episode started yesterday. The problem has not changed since onset.The emesis has an appearance of stomach contents. There has been no fever. Pertinent negatives include no abdominal pain, no cough, no diarrhea and no headaches. Risk factors include ill contacts.   Pt has associated diarrhea, feels sick to stomach, has chills and is vomiting up clear liquid. Pt states that she has a throbbing pain in one side of her face. PCP-Dr. Juanetta Gosling No past medical history on file.  No past surgical history on file.  No family history on file.  History  Substance Use Topics  . Smoking status: Never Smoker   . Smokeless tobacco: Not on file  . Alcohol Use: No    OB History    Grav Para Term Preterm Abortions TAB SAB Ect Mult Living                  Review of Systems  Constitutional: Negative for fatigue.  HENT: Negative for congestion, sinus pressure and ear discharge.   Eyes: Negative for discharge.  Respiratory: Negative for cough.   Cardiovascular: Negative for chest pain.  Gastrointestinal: Positive for vomiting. Negative for abdominal pain and diarrhea.  Genitourinary: Negative for frequency and hematuria.  Musculoskeletal: Negative for back pain.  Skin: Negative for rash.  Neurological: Negative for seizures and headaches.  Hematological: Negative.   Psychiatric/Behavioral: Negative for hallucinations.     Allergies  Sulfonamide derivatives and Codeine  Home Medications   Current Outpatient Rx  Name Route Sig Dispense Refill  . AZITHROMYCIN 250 MG PO TABS Oral Take 250-500 mg by mouth daily. Take 2 tablets by mouth on day 1 then take one tablet by mouth for days 2-5    . CETIRIZINE HCL 10 MG PO TABS Oral Take 10 mg by mouth daily.    Marland Kitchen HYDROCODONE-ACETAMINOPHEN 5-500 MG PO TABS Oral Take 1 tablet by mouth every 6 (six) hours as needed. Pain    . SIMVASTATIN 40 MG PO TABS Oral Take 40 mg by mouth every evening.    Marland Kitchen ONDANSETRON 4 MG PO TBDP Oral Take 1 tablet (4 mg total) by mouth every 8 (eight) hours as needed for nausea. 20 tablet 0    BP 132/90  Pulse 82  Temp(Src) 97.5 F (36.4 C) (Oral)  Resp 20  Ht 5\' 9"  (1.753 m)  Wt 134 lb (60.782 kg)  BMI 19.79 kg/m2  SpO2 100%  Physical Exam  Nursing note and vitals reviewed. Constitutional: She is oriented to person, place, and time. She appears well-developed.  HENT:  Head: Normocephalic and atraumatic.       Dry mucous membranes  Eyes: Conjunctivae and EOM are normal. No scleral icterus.  Neck: Neck supple. No thyromegaly present.  Cardiovascular: Normal rate and regular rhythm.  Exam reveals no gallop and no friction rub.   No murmur heard. Pulmonary/Chest: No stridor. She has no wheezes. She has no rales. She exhibits  no tenderness.  Abdominal: She exhibits no distension. There is tenderness. There is no rebound.       Minimally tender in left upper quadrant  Musculoskeletal: Normal range of motion. She exhibits no edema.  Lymphadenopathy:    She has no cervical adenopathy.  Neurological: She is oriented to person, place, and time. Coordination normal.  Skin: No rash noted. No erythema.  Psychiatric: She has a normal mood and affect. Her behavior is normal.    ED Course  Procedures (including critical care time)  Labs Reviewed  CBC - Abnormal; Notable for the following:    HCT 35.7 (*)    All other components  within normal limits  DIFFERENTIAL - Abnormal; Notable for the following:    Monocytes Relative 14 (*)    Monocytes Absolute 1.1 (*)    All other components within normal limits  COMPREHENSIVE METABOLIC PANEL - Abnormal; Notable for the following:    Glucose, Bld 114 (*)    GFR calc non Af Amer 86 (*)    All other components within normal limits   Dg Abd Acute W/chest  01/19/2011  *RADIOLOGY REPORT*  Clinical Data: Diarrhea  ACUTE ABDOMEN SERIES (ABDOMEN 2 VIEW & CHEST 1 VIEW)  Comparison: Abdominal radiographs 05/28/2009, chest radiograph 12/08/2009  Findings: Upper-normal size of cardiac silhouette. Tortuous aorta. Moderate sized hiatal hernia. Mediastinal contours and pulmonary vascularity normal. Ovoid nodular density right upper lobe, 7 x 5 mm, not seen on the two most recent previous exams. Bronchitic changes without infiltrate or effusion. No pneumothorax. Bones severely demineralized.  Distended bladder. Pelvic phleboliths. Nonobstructive bowel gas pattern. No bowel dilatation, bowel wall thickening, or free intraperitoneal air. No urinary tract calcification.  IMPRESSION: Moderate sized hiatal hernia. Bronchitic changes. Questionable right upper lobe nodule 7 x 5 mm; recommend CT chest to exclude developing pulmonary nodule. No acute abdominal findings.  Original Report Authenticated By: Lollie Marrow, M.D.     1. Gastroenteritis     Pt improved with tx.  She stated dr. Juanetta Gosling is following her lung lesions  MDM    The chart was scribed for me under my direct supervision.  I personally performed the history, physical, and medical decision making and all procedures in the evaluation of this patient.Benny Lennert, MD 01/19/11 [phone]

## 2011-02-17 ENCOUNTER — Ambulatory Visit (HOSPITAL_COMMUNITY)
Admission: RE | Admit: 2011-02-17 | Discharge: 2011-02-17 | Disposition: A | Discharge status: Home / Self Care | Payer: Medicare Other | Source: Ambulatory Visit | Attending: Pulmonary Disease | Admitting: Pulmonary Disease

## 2011-02-17 ENCOUNTER — Other Ambulatory Visit (HOSPITAL_COMMUNITY): Payer: Self-pay | Admitting: Pulmonary Disease

## 2011-02-17 DIAGNOSIS — R911 Solitary pulmonary nodule: Secondary | ICD-10-CM | POA: Diagnosis not present

## 2011-02-17 DIAGNOSIS — J4489 Other specified chronic obstructive pulmonary disease: Secondary | ICD-10-CM | POA: Insufficient documentation

## 2011-02-17 DIAGNOSIS — K449 Diaphragmatic hernia without obstruction or gangrene: Secondary | ICD-10-CM | POA: Diagnosis not present

## 2011-02-17 DIAGNOSIS — J984 Other disorders of lung: Secondary | ICD-10-CM | POA: Diagnosis not present

## 2011-02-17 DIAGNOSIS — J449 Chronic obstructive pulmonary disease, unspecified: Secondary | ICD-10-CM | POA: Diagnosis not present

## 2011-02-17 DIAGNOSIS — G4700 Insomnia, unspecified: Secondary | ICD-10-CM | POA: Diagnosis not present

## 2011-03-04 DIAGNOSIS — M76899 Other specified enthesopathies of unspecified lower limb, excluding foot: Secondary | ICD-10-CM | POA: Diagnosis not present

## 2011-03-04 DIAGNOSIS — M545 Low back pain: Secondary | ICD-10-CM | POA: Diagnosis not present

## 2011-03-05 ENCOUNTER — Other Ambulatory Visit: Payer: Self-pay

## 2011-03-05 ENCOUNTER — Emergency Department (HOSPITAL_COMMUNITY): Payer: Medicare Other

## 2011-03-05 ENCOUNTER — Emergency Department (HOSPITAL_COMMUNITY)
Admission: EM | Admit: 2011-03-05 | Discharge: 2011-03-05 | Disposition: A | Discharge status: Home Health | Payer: Medicare Other | Attending: Emergency Medicine | Admitting: Emergency Medicine

## 2011-03-05 ENCOUNTER — Encounter (HOSPITAL_COMMUNITY): Payer: Self-pay | Admitting: Emergency Medicine

## 2011-03-05 DIAGNOSIS — Z79899 Other long term (current) drug therapy: Secondary | ICD-10-CM | POA: Diagnosis not present

## 2011-03-05 DIAGNOSIS — M810 Age-related osteoporosis without current pathological fracture: Secondary | ICD-10-CM | POA: Insufficient documentation

## 2011-03-05 DIAGNOSIS — K219 Gastro-esophageal reflux disease without esophagitis: Secondary | ICD-10-CM | POA: Insufficient documentation

## 2011-03-05 DIAGNOSIS — M545 Low back pain, unspecified: Secondary | ICD-10-CM | POA: Diagnosis not present

## 2011-03-05 DIAGNOSIS — N390 Urinary tract infection, site not specified: Secondary | ICD-10-CM | POA: Diagnosis not present

## 2011-03-05 DIAGNOSIS — J984 Other disorders of lung: Secondary | ICD-10-CM | POA: Diagnosis not present

## 2011-03-05 DIAGNOSIS — R1011 Right upper quadrant pain: Secondary | ICD-10-CM | POA: Diagnosis not present

## 2011-03-05 DIAGNOSIS — E7800 Pure hypercholesterolemia, unspecified: Secondary | ICD-10-CM | POA: Diagnosis not present

## 2011-03-05 DIAGNOSIS — J449 Chronic obstructive pulmonary disease, unspecified: Secondary | ICD-10-CM | POA: Diagnosis not present

## 2011-03-05 LAB — COMPREHENSIVE METABOLIC PANEL
ALT: 21 U/L (ref 0–35)
Alkaline Phosphatase: 94 U/L (ref 39–117)
CO2: 29 mEq/L (ref 19–32)
Chloride: 101 mEq/L (ref 96–112)
GFR calc Af Amer: >90 mL/min (ref >90)
GFR calc non Af Amer: 83 mL/min — ABNORMAL LOW (ref >90)
Glucose, Bld: 99 mg/dL (ref 70–99)
Potassium: 3.7 mEq/L (ref 3.5–5.1)
Sodium: 139 mEq/L (ref 135–145)

## 2011-03-05 LAB — DIFFERENTIAL
Lymphocytes Relative: 32 % (ref 12–46)
Lymphs Abs: 1.8 10*3/uL (ref 0.7–4.0)
Neutrophils Relative %: 52 % (ref 43–77)

## 2011-03-05 LAB — URINALYSIS, ROUTINE W REFLEX MICROSCOPIC
Bilirubin Urine: NEGATIVE
Hgb urine dipstick: NEGATIVE
Specific Gravity, Urine: 1.01 (ref 1.005–1.030)
Urobilinogen, UA: 0.2 mg/dL (ref 0.0–1.0)

## 2011-03-05 LAB — URINE MICROSCOPIC-ADD ON

## 2011-03-05 LAB — CBC
Platelets: 188 10*3/uL (ref 150–400)
RBC: 4.19 MIL/uL (ref 3.87–5.11)
WBC: 5.7 10*3/uL (ref 4.0–10.5)

## 2011-03-05 MED ORDER — CEFUROXIME AXETIL 500 MG PO TABS: 500.0000 mg | ORAL_TABLET | Freq: Two times a day (BID) | ORAL | Status: AC

## 2011-03-05 MED ORDER — CEFUROXIME AXETIL 250 MG PO TABS
500.0000 mg | ORAL_TABLET | Freq: Once | ORAL | Status: AC
  Administered 2011-03-05: 500 mg via ORAL
  Filled 2011-03-05: qty 1
  Filled 2011-03-05: qty 2

## 2011-03-05 MED ORDER — SODIUM CHLORIDE 0.9 % IV SOLN
Freq: Once | INTRAVENOUS | Status: AC
  Administered 2011-03-05: 1000 mL via INTRAVENOUS

## 2011-03-05 NOTE — ED Notes (Signed)
Patient transported to X-ray 

## 2011-03-05 NOTE — ED Provider Notes (Signed)
History     CSN: [id number]  Arrival date & time 03/05/11  4098   First MD Initiated Contact with Patient 03/05/11 [phone]      Chief Complaint  Patient presents with  . Abdominal Pain    ruq    (Consider location/radiation/quality/duration/timing/severity/associated sxs/prior treatment) HPI Comments: Pt denies fever.  She describes "SHARP" intermittent RUQ abdominal pain with radiation straight thru to back in R CVA region.  Pain no affected by eating or drinking.  It seems somewhat worse with sitting position.  No PT.  No cough or SOB.  H/o one kidney stone in past.  No urinary sxs.  She called dr. Juanetta Gosling office but "they are booked up".  No abdominal surgeries in past.  Patient is a 77 y.o. female presenting with abdominal pain. The history is provided by the patient and a friend. No language interpreter was used.  Abdominal Pain The primary symptoms of the illness include abdominal pain. The primary symptoms of the illness do not include fever, shortness of breath, nausea, vomiting, diarrhea, hematemesis, hematochezia, dysuria, vaginal discharge or vaginal bleeding. Episode onset: 5 days ago. The onset of the illness was gradual. The problem has not changed since onset. The patient states that she believes she is currently not pregnant. The patient has not had a change in bowel habit. Risk factors for an acute abdominal problem include being elderly. Symptoms associated with the illness do not include chills, anorexia, diaphoresis, heartburn, constipation, urgency, hematuria or frequency. Significant associated medical issues include GERD. Significant associated medical issues do not include inflammatory bowel disease, diabetes, sickle cell disease, gallstones, liver disease, substance abuse, diverticulitis, HIV or cardiac disease.    Past Medical History  Diagnosis Date  . High cholesterol   . Osteoporosis   . Acid reflux     History reviewed. No pertinent past surgical  history.  History reviewed. No pertinent family history.  History  Substance Use Topics  . Smoking status: Never Smoker   . Smokeless tobacco: Not on file  . Alcohol Use: No    OB History    Grav Para Term Preterm Abortions TAB SAB Ect Mult Living                  Review of Systems  Constitutional: Negative for fever, chills, diaphoresis, appetite change and unexpected weight change.  Respiratory: Negative for cough, chest tightness, shortness of breath, wheezing and stridor.   Cardiovascular: Negative for chest pain, palpitations and leg swelling.  Gastrointestinal: Positive for abdominal pain. Negative for heartburn, nausea, vomiting, diarrhea, constipation, hematochezia, abdominal distention, anal bleeding, rectal pain, anorexia and hematemesis.  Genitourinary: Negative for dysuria, urgency, frequency, hematuria, decreased urine volume, vaginal bleeding, vaginal discharge, vaginal pain and pelvic pain.  All other systems reviewed and are negative.    Allergies  Codeine; Levaquin; and Sulfonamide derivatives  Home Medications   Current Outpatient Rx  Name Route Sig Dispense Refill  . CETIRIZINE HCL 10 MG PO TABS Oral Take 10 mg by mouth daily.    Marland Kitchen HYDROCODONE-ACETAMINOPHEN 5-500 MG PO TABS Oral Take 1 tablet by mouth every 6 (six) hours as needed. For Pain    . IBUPROFEN 200 MG PO TABS Oral Take 400 mg by mouth every 6 (six) hours as needed. For pain    . PANTOPRAZOLE SODIUM 40 MG PO TBEC Oral Take 40 mg by mouth daily.    Marland Kitchen SIMVASTATIN 40 MG PO TABS Oral Take 40 mg by mouth every evening.    Marland Kitchen  TRAMADOL HCL 50 MG PO TABS Oral Take 50 mg by mouth every 6 (six) hours as needed. For pain    . AZITHROMYCIN 250 MG PO TABS Oral Take 250-500 mg by mouth daily. Take 2 tablets by mouth on day 1 then take one tablet by mouth for days 2-5    . CEFUROXIME AXETIL 500 MG PO TABS Oral Take 1 tablet (500 mg total) by mouth 2 (two) times daily. 10 tablet 0    BP 124/98  Pulse 74  Temp  98.3 F (36.8 C)  Resp 18  Ht 5\' 5"  (1.651 m)  Wt 144 lb (65.318 kg)  BMI 23.96 kg/m2  SpO2 100%  Physical Exam  Nursing note and vitals reviewed. Constitutional: She is oriented to person, place, and time. She appears well-developed and well-nourished. She is cooperative.  Non-toxic appearance. She does not have a sickly appearance. She does not appear ill. No distress.  HENT:  Head: Normocephalic and atraumatic.  Nose: Nose normal.  Mouth/Throat: Oropharynx is clear and moist. No oropharyngeal exudate.  Eyes: EOM are normal.  Neck: Normal range of motion. No JVD present.  Cardiovascular: Normal rate, regular rhythm, S1 normal, S2 normal and normal heart sounds.   No extrasystoles are present. PMI is not displaced.  Exam reveals no gallop and no friction rub.   No murmur heard. Pulmonary/Chest: Effort normal and breath sounds normal. No stridor. No respiratory distress. She has no wheezes. She has no rales.  Abdominal: Soft. Bowel sounds are normal. She exhibits no distension, no pulsatile liver, no abdominal bruit, no ascites and no mass. There is no hepatosplenomegaly. There is tenderness in the left lower quadrant. There is no rebound, no guarding, no CVA tenderness and negative Murphy's sign.    Musculoskeletal: Normal range of motion. She exhibits no tenderness.       Lumbar back: She exhibits pain. She exhibits normal range of motion, no tenderness, no bony tenderness, no swelling, no edema, no deformity, no laceration, no spasm and normal pulse.       Back:  Lymphadenopathy:    She has no cervical adenopathy.  Neurological: She is alert and oriented to person, place, and time. No cranial nerve deficit. Coordination normal.  Skin: Skin is warm and dry. She is not diaphoretic.  Psychiatric: She has a normal mood and affect. Judgment normal.    ED Course  Procedures (including critical care time)  Labs Reviewed  DIFFERENTIAL - Abnormal; Notable for the following:     Monocytes Relative 13 (*)    All other components within normal limits  COMPREHENSIVE METABOLIC PANEL - Abnormal; Notable for the following:    GFR calc non Af Amer 83 (*)    All other components within normal limits  URINALYSIS, ROUTINE W REFLEX MICROSCOPIC - Abnormal; Notable for the following:    Leukocytes, UA SMALL (*)    All other components within normal limits  URINE MICROSCOPIC-ADD ON - Abnormal; Notable for the following:    Bacteria, UA MANY (*)    All other components within normal limits  CBC  LIPASE, BLOOD  Results for [name], [name] (MRN [id number]) as of 03/05/2011 13:48  Ref. Range 03/05/2011 09:20 03/05/2011 09:45  Sodium Latest Range: 135-145 mEq/L  139  Potassium Latest Range: 3.5-5.1 mEq/L  3.7  Chloride Latest Range: 96-112 mEq/L  101  CO2 Latest Range: 19-32 mEq/L  29  BUN Latest Range: 6-23 mg/dL  13  Creat Latest Range: 0.50-1.10 mg/dL  1.91  Calcium Latest  Range: 8.4-10.5 mg/dL  9.8  GFR calc non Af Amer Latest Range: >90 mL/min  83 (L)  GFR calc Af Amer Latest Range: >90 mL/min  >90  Glucose Latest Range: 70-99 mg/dL  99  Alkaline Phosphatase Latest Range: 39-117 U/L  94  Albumin Latest Range: 3.5-5.2 g/dL  3.8  Lipase Latest Range: 11-59 U/L  34  AST Latest Range: 0-37 U/L  28  ALT Latest Range: 0-35 U/L  21  Total Protein Latest Range: 6.0-8.3 g/dL  6.9  Total Bilirubin Latest Range: 0.3-1.2 mg/dL  0.3  WBC Latest Range: 4.0-10.5 K/uL  5.7  RBC Latest Range: 3.87-5.11 MIL/uL  4.19  HGB Latest Range: 12.0-15.0 g/dL  45.4  HCT Latest Range: 36.0-46.0 %  36.4  MCV Latest Range: 78.0-100.0 fL  86.9  MCH Latest Range: 26.0-34.0 pg  28.6  MCHC Latest Range: 30.0-36.0 g/dL  09.8  RDW Latest Range: 11.5-15.5 %  13.0  Platelets Latest Range: 150-400 K/uL  188  Neutrophils Relative Latest Range: 43-77 %  52  Lymphocytes Relative Latest Range: 12-46 %  32  Monocytes Relative Latest Range: 3-12 %  13 (H)  Eosinophils Relative Latest Range: 0-5 %  3  Basophils  Relative Latest Range: 0-1 %  1  Neutrophils Absolute Latest Range: 1.7-7.7 K/uL  3.0  Lymphocytes Absolute Latest Range: 0.7-4.0 K/uL  1.8  Monocytes Absolute Latest Range: 0.1-1.0 K/uL  0.7  Eosinophils Absolute Latest Range: 0.0-0.7 K/uL  0.2  Basophils Absolute Latest Range: 0.0-0.1 K/uL  0.0  Color, Urine Latest Range: YELLOW  YELLOW   APPearance Latest Range: CLEAR  CLEAR   Specific Gravity, Urine Latest Range: 1.005-1.030  1.010   pH Latest Range: 5.0-8.0  6.0   Glucose, UA Latest Range: NEGATIVE mg/dL NEGATIVE   Bilirubin Urine Latest Range: NEGATIVE  NEGATIVE   Ketones, ur Latest Range: NEGATIVE mg/dL NEGATIVE   Protein Latest Range: NEGATIVE mg/dL NEGATIVE   Urobilinogen, UA Latest Range: 0.0-1.0 mg/dL 0.2   Nitrite Latest Range: NEGATIVE  NEGATIVE   Leukocytes, UA Latest Range: NEGATIVE  SMALL (A)   Hgb urine dipstick Latest Range: NEGATIVE  NEGATIVE   WBC, UA Latest Range: <3 WBC/hpf TOO NUMEROUS TO COUNT   Bacteria, UA Latest Range: RARE  MANY (A)    No results found.  Date: 03/05/2011  Rate: 81  Rhythm: normal sinus rhythm  QRS Axis: normal  Intervals: normal  ST/T Wave abnormalities: normal  Conduction Disutrbances:none  Narrative Interpretation:   Old EKG Reviewed: unchanged    1. UTI (urinary tract infection)       MDM  Discussed work up and lab results with pt.  Spoke with dr. Lynelle Doctor who recommended ceftin for OP tx and f/u with PCP.  Gallbladder ultrasound was cancelled. Also discussed ceftin dosing with the [facility] pharmacist.        Worthy Rancher, PA 03/05/11 1218  Worthy Rancher, [place] 03/07/11 [phone]

## 2011-03-05 NOTE — ED Provider Notes (Signed)
Pt with ruq pain for 5 days.  No vomiting or diarrhea.  On exam at this time, no ttp ruq. No rebound or guarding. Will check labs.  Doubt cardiac pulmonary etiology.  Suspect gastritis over  possible biliary etiology.  Medical screening examination/treatment/procedure(s) were conducted as a shared visit with non-physician practitioner(s) and myself.  I personally evaluated the patient during the encounter   Celene Kras, MD 03/05/11 1622

## 2011-03-05 NOTE — ED Notes (Signed)
Pt c/o ruq pain radiating into side/back and belching x 5 days. Denies n/v/d/constipation. nad noted.

## 2011-03-05 NOTE — Discharge Instructions (Signed)
Urinary Tract Infection Infections of the urinary tract can start in several places. A bladder infection (cystitis), a kidney infection (pyelonephritis), and a prostate infection (prostatitis) are different types of urinary tract infections (UTIs). They usually get better if treated with medicines (antibiotics) that kill germs. Take all the medicine until it is gone. You or your child may feel better in a few days, but TAKE ALL MEDICINE or the infection may not respond and may become more difficult to treat. HOME CARE INSTRUCTIONS   Drink enough water and fluids to keep the urine clear or pale yellow. Cranberry juice is especially recommended, in addition to large amounts of water.   Avoid caffeine, tea, and carbonated beverages. They tend to irritate the bladder.   Alcohol may irritate the prostate.   Only take over-the-counter or prescription medicines for pain, discomfort, or fever as directed by your caregiver.  To prevent further infections:  Empty the bladder often. Avoid holding urine for long periods of time.   After a bowel movement, women should cleanse from front to back. Use each tissue only once.   Empty the bladder before and after sexual intercourse.  FINDING OUT THE RESULTS OF YOUR TEST Not all test results are available during your visit. If your or your child's test results are not back during the visit, make an appointment with your caregiver to find out the results. Do not assume everything is normal if you have not heard from your caregiver or the medical facility. It is important for you to follow up on all test results. SEEK MEDICAL CARE IF:   There is back pain.   Your baby is older than 3 months with a rectal temperature of 100.5 F (38.1 C) or higher for more than 1 day.   Your or your child's problems (symptoms) are no better in 3 days. Return sooner if you or your child is getting worse.  SEEK IMMEDIATE MEDICAL CARE IF:   There is severe back pain or lower  abdominal pain.   You or your child develops chills.   You have a fever.   Your baby is older than 3 months with a rectal temperature of 102 F (38.9 C) or higher.   Your baby is 17 months old or younger with a rectal temperature of 100.4 F (38 C) or higher.   There is nausea or vomiting.   There is continued burning or discomfort with urination.  MAKE SURE YOU:   Understand these instructions.   Will watch your condition.   Will get help right away if you are not doing well or get worse.  Document Released: 09/30/2004 Document Revised: 09/02/2010 Document Reviewed: 05/05/2006 [facility] Patient Information 2012 [place], [place].    Take the antibiotic as directed until gone.  Drink plenty of fluids.  Call your MD and make an appt for follow up in the next few days.

## 2011-03-07 NOTE — ED Provider Notes (Signed)
Medical screening examination/treatment/procedure(s) were performed by non-physician practitioner and as supervising physician I was immediately available for consultation/collaboration.    Zarion Oliff R Aloysious Vangieson, MD 03/07/11 1102 

## 2011-03-09 ENCOUNTER — Other Ambulatory Visit (HOSPITAL_COMMUNITY): Payer: Self-pay | Admitting: Internal Medicine

## 2011-03-09 DIAGNOSIS — N390 Urinary tract infection, site not specified: Secondary | ICD-10-CM | POA: Diagnosis not present

## 2011-03-11 ENCOUNTER — Ambulatory Visit (HOSPITAL_COMMUNITY)
Admission: RE | Admit: 2011-03-11 | Discharge: 2011-03-11 | Disposition: A | Discharge status: Home / Self Care | Payer: Medicare Other | Source: Ambulatory Visit | Attending: Internal Medicine | Admitting: Internal Medicine

## 2011-03-11 DIAGNOSIS — R9389 Abnormal findings on diagnostic imaging of other specified body structures: Secondary | ICD-10-CM | POA: Diagnosis not present

## 2011-03-11 DIAGNOSIS — N281 Cyst of kidney, acquired: Secondary | ICD-10-CM | POA: Diagnosis not present

## 2011-03-11 DIAGNOSIS — R109 Unspecified abdominal pain: Secondary | ICD-10-CM | POA: Diagnosis not present

## 2011-03-11 DIAGNOSIS — Q619 Cystic kidney disease, unspecified: Secondary | ICD-10-CM | POA: Diagnosis not present

## 2011-03-15 DIAGNOSIS — N390 Urinary tract infection, site not specified: Secondary | ICD-10-CM | POA: Diagnosis not present

## 2011-03-15 DIAGNOSIS — J449 Chronic obstructive pulmonary disease, unspecified: Secondary | ICD-10-CM | POA: Diagnosis not present

## 2011-03-21 ENCOUNTER — Encounter (HOSPITAL_COMMUNITY): Payer: Self-pay | Admitting: *Deleted

## 2011-03-21 ENCOUNTER — Emergency Department (HOSPITAL_COMMUNITY)
Admission: EM | Admit: 2011-03-21 | Discharge: 2011-03-21 | Disposition: A | Discharge status: Home / Self Care | Payer: Medicare Other | Attending: Emergency Medicine | Admitting: Emergency Medicine

## 2011-03-21 DIAGNOSIS — R10816 Epigastric abdominal tenderness: Secondary | ICD-10-CM | POA: Diagnosis not present

## 2011-03-21 DIAGNOSIS — K219 Gastro-esophageal reflux disease without esophagitis: Secondary | ICD-10-CM | POA: Insufficient documentation

## 2011-03-21 DIAGNOSIS — R109 Unspecified abdominal pain: Secondary | ICD-10-CM | POA: Diagnosis not present

## 2011-03-21 DIAGNOSIS — L723 Sebaceous cyst: Secondary | ICD-10-CM | POA: Diagnosis not present

## 2011-03-21 DIAGNOSIS — E7800 Pure hypercholesterolemia, unspecified: Secondary | ICD-10-CM | POA: Diagnosis not present

## 2011-03-21 LAB — COMPREHENSIVE METABOLIC PANEL
Alkaline Phosphatase: 81 U/L (ref 39–117)
BUN: 9 mg/dL (ref 6–23)
Chloride: 103 mEq/L (ref 96–112)
Creatinine, Ser: 0.74 mg/dL (ref 0.50–1.10)
GFR calc Af Amer: >90 mL/min (ref >90)
GFR calc non Af Amer: 82 mL/min — ABNORMAL LOW (ref >90)
Glucose, Bld: 82 mg/dL (ref 70–99)
Potassium: 4.1 mEq/L (ref 3.5–5.1)
Total Bilirubin: 0.3 mg/dL (ref 0.3–1.2)

## 2011-03-21 LAB — CBC
HCT: 36.3 % (ref 36.0–46.0)
Hemoglobin: 12.2 g/dL (ref 12.0–15.0)
MCH: 28.6 pg (ref 26.0–34.0)
MCHC: 33.6 g/dL (ref 30.0–36.0)
MCV: 85 fL (ref 78.0–100.0)
RBC: 4.27 MIL/uL (ref 3.87–5.11)

## 2011-03-21 LAB — LIPASE, BLOOD: Lipase: 23 U/L (ref 11–59)

## 2011-03-21 LAB — DIFFERENTIAL
Lymphs Abs: 1.6 10*3/uL (ref 0.7–4.0)
Monocytes Absolute: 0.9 10*3/uL (ref 0.1–1.0)
Monocytes Relative: 19 % — ABNORMAL HIGH (ref 3–12)
Neutro Abs: 2 10*3/uL (ref 1.7–7.7)
Neutrophils Relative %: 44 % (ref 43–77)

## 2011-03-21 MED ORDER — GI COCKTAIL ~~LOC~~
30.0000 mL | Freq: Once | ORAL | Status: AC
  Administered 2011-03-21: 30 mL via ORAL
  Filled 2011-03-21: qty 30

## 2011-03-21 NOTE — ED Notes (Signed)
Family at bedside. Patient is waiting on discharge paperwork.

## 2011-03-21 NOTE — ED Notes (Signed)
Pt states she has been having epigastric pain described as burning for aprox a week. Pt states she has had pain similar to this before and treatet by pcp. Pt states she was seen last Monday by pcp with no relief.

## 2011-03-21 NOTE — ED Notes (Signed)
Pt c/o pain at iv site. Iv removed per pt request.

## 2011-03-21 NOTE — Discharge Instructions (Signed)
Increase protonix to two times a day. ; follow up with your md in one week.

## 2011-03-21 NOTE — ED Provider Notes (Signed)
This chart was scribed for Benny Lennert, MD by Wallis Mart. The patient was seen in room APA07/APA07 and the patient's care was started at 10:31 AM.   CSN: [id number]  Arrival date & time 03/21/11  0956   First MD Initiated Contact with Patient 03/21/11 1021      Chief Complaint  Patient presents with  . Abdominal Pain    (Consider location/radiation/quality/duration/timing/severity/associated sxs/prior treatment) Patient is a 77 y.o. female presenting with abdominal pain.  Abdominal Pain The primary symptoms of the illness include abdominal pain. The primary symptoms of the illness do not include fever, fatigue, nausea, vomiting or diarrhea. The current episode started more than 2 days ago. The onset of the illness was sudden. The problem has been gradually worsening.  Risk factors for an acute abdominal problem include being elderly. Symptoms associated with the illness do not include hematuria, frequency or back pain.   [name] is a 77 y.o. female who presents to the Emergency Department complaining of sudden onset, persistence of constant, gradually worsening, moderate epigastric, non-radiating abdominal pain onset one week ago. Pt describes pain as burning and has had similar sx before. Pt was seen in ER 2 weeks ago and no diagnosis was made. Pt was given an ultrasound last week  by PCP w/ nothing found. There are no other associated symptoms and no other alleviating or aggravating factors. Pt w/ h/o acid reflux.  PCP: Juanetta Gosling Past Medical History  Diagnosis Date  . High cholesterol   . Osteoporosis   . Acid reflux     History reviewed. No pertinent past surgical history.  History reviewed. No pertinent family history.  History  Substance Use Topics  . Smoking status: Never Smoker   . Smokeless tobacco: Not on file  . Alcohol Use: No    OB History    Grav Para Term Preterm Abortions TAB SAB Ect Mult Living                  Review of Systems    Constitutional: Negative for fever and fatigue.  HENT: Negative for congestion, sinus pressure and ear discharge.   Eyes: Negative for discharge and itching.  Respiratory: Negative for cough and wheezing.   Cardiovascular: Negative for chest pain and palpitations.  Gastrointestinal: Positive for abdominal pain. Negative for nausea, vomiting and diarrhea.  Genitourinary: Negative for frequency and hematuria.  Musculoskeletal: Negative for myalgias and back pain.  Skin: Negative for pallor and rash.  Neurological: Negative for seizures and headaches.  Hematological: Negative.   Psychiatric/Behavioral: Negative for hallucinations.    Allergies  Codeine; Levaquin; and Sulfonamide derivatives  Home Medications   Current Outpatient Rx  Name Route Sig Dispense Refill  . AZITHROMYCIN 250 MG PO TABS Oral Take 250-500 mg by mouth daily. Take 2 tablets by mouth on day 1 then take one tablet by mouth for days 2-5    . CETIRIZINE HCL 10 MG PO TABS Oral Take 10 mg by mouth daily.    Marland Kitchen HYDROCODONE-ACETAMINOPHEN 5-500 MG PO TABS Oral Take 1 tablet by mouth every 6 (six) hours as needed. For Pain    . IBUPROFEN 200 MG PO TABS Oral Take 400 mg by mouth every 6 (six) hours as needed. For pain    . PANTOPRAZOLE SODIUM 40 MG PO TBEC Oral Take 40 mg by mouth daily.    Marland Kitchen SIMVASTATIN 40 MG PO TABS Oral Take 40 mg by mouth every evening.    Marland Kitchen TRAMADOL HCL 50 MG  PO TABS Oral Take 50 mg by mouth every 6 (six) hours as needed. For pain      BP 147/89  Pulse 73  Temp(Src) 97.9 F (36.6 C) (Oral)  Resp 16  Ht 5\' 3"  (1.6 m)  Wt 140 lb (63.504 kg)  BMI 24.80 kg/m2  SpO2 100%  Physical Exam  Constitutional: She is oriented to person, place, and time. She appears well-developed.  HENT:  Head: Normocephalic and atraumatic.       Large cyst to left lateral posterior neck that is nontender  Eyes: Conjunctivae and EOM are normal. No scleral icterus.  Neck: Neck supple. No thyromegaly present.   Cardiovascular: Normal rate and regular rhythm.  Exam reveals no gallop and no friction rub.   No murmur heard. Pulmonary/Chest: No stridor. She has no wheezes. She has no rales. She exhibits no tenderness.  Abdominal: She exhibits no distension. There is tenderness. There is no rebound.       Epigastric tenderness  Musculoskeletal: Normal range of motion. She exhibits no edema.  Lymphadenopathy:    She has no cervical adenopathy.  Neurological: She is oriented to person, place, and time. Coordination normal.  Skin: No rash noted. No erythema.  Psychiatric: She has a normal mood and affect. Her behavior is normal.    ED Course  Procedures (including critical care time) DIAGNOSTIC STUDIES: Oxygen Saturation is 100% on room air, normal by my interpretation.    COORDINATION OF CARE:  1:45 PM: EDP at bedside, All results reviewed and discussed with pt, questions answered, pt agreeable with plan. Pt to be discharged.    Labs Reviewed  DIFFERENTIAL - Abnormal; Notable for the following:    Monocytes Relative 19 (*)    All other components within normal limits  COMPREHENSIVE METABOLIC PANEL - Abnormal; Notable for the following:    GFR calc non Af Amer 82 (*)    All other components within normal limits  CBC  LIPASE, BLOOD   No results found.   No diagnosis found.  Pt improved with protonix  MDM    The chart was scribed for me under my direct supervision.  I personally performed the history, physical, and medical decision making and all procedures in the evaluation of this patient.Benny Lennert, MD 03/21/11 1350

## 2011-03-24 ENCOUNTER — Encounter: Payer: Self-pay | Admitting: Gastroenterology

## 2011-03-24 ENCOUNTER — Ambulatory Visit (INDEPENDENT_AMBULATORY_CARE_PROVIDER_SITE_OTHER): Payer: Medicare Other | Admitting: Gastroenterology

## 2011-03-24 VITALS — BP 134/81 | HR 75 | Temp 97.6°F | Ht 68.0 in | Wt 140.2 lb

## 2011-03-24 DIAGNOSIS — D369 Benign neoplasm, unspecified site: Secondary | ICD-10-CM | POA: Diagnosis not present

## 2011-03-24 DIAGNOSIS — R1011 Right upper quadrant pain: Secondary | ICD-10-CM

## 2011-03-24 NOTE — Patient Instructions (Signed)
Continue taking Nexium.  We have set you up for an upper endoscopy with Dr. Jena Gauss in the near future. Depending on these results, we may need to proceed with further tests. We will know more after this procedure.

## 2011-03-24 NOTE — Progress Notes (Signed)
Referring Provider: Fredirick Maudlin, MD Primary Care Physician:  Fredirick Maudlin, MD, MD Primary Gastroenterologist: Dr. Jena Gauss   Chief Complaint  Patient presents with  . Abdominal Pain    HPI:   [name] presents today at the request of Dr. Juanetta Gosling due to RUQ abdominal pain. She is known to [place] previously, undergoing a colonoscopy in Oct 2011 with multiple tubulovillous adenomas. Due for surveillance Oct 2013.  She notes 2-3 week hx of RUQ pain, wrapping around to back. Denies epigastric pain. Described as burning, not worsened by eating/drinking. Notes intermittent, no N/V. Sitting makes worse. +belching, +reflux worsening.   Was on Protonix, then switched to Nexium. No dysphagia. No black tarry stools. Takes Motrin prn, once per day.  No fever or chills. No loss of appetite or unexplained wt loss. + early satiety.   She has been to the ED several times in March, diagnosed with UTI at one point. [place] of abdomen essentially benign, labs ok. No evidence of pancreatitis via labs. No CT on file.   [place] 03/09/11:  IMPRESSION:  CBD WNL, liver WNL Pancreas obscured.  Benign-appearing cyst in the right kidney.  Increased renal cortical echogenicity compatible with medical renal  parenchymal disease.  03/2011 labs CBC normal CMP normal Lipase 23, normal   Past Medical History  Diagnosis Date  . High cholesterol   . Osteoporosis   . Acid reflux     Past Surgical History  Procedure Date  . Colonoscopy 10/2009    sigmoid diverticula, multiple tubulovillous adneomas, needs surveillance Oct 2013    Current Outpatient Prescriptions  Medication Sig Dispense Refill  . ALPRAZolam (XANAX) 0.5 MG tablet Take 0.5 mg by mouth at bedtime as needed.       . cetirizine (ZYRTEC) 10 MG tablet Take 10 mg by mouth daily.      Marland Kitchen esomeprazole (NEXIUM) 40 MG capsule Take 40 mg by mouth daily before breakfast.      . HYDROcodone-acetaminophen (VICODIN) 5-500 MG per tablet Take 1 tablet by mouth  every 6 (six) hours as needed. For Pain      . simvastatin (ZOCOR) 40 MG tablet Take 40 mg by mouth every evening.      . traMADol (ULTRAM) 50 MG tablet Take 50 mg by mouth every 6 (six) hours as needed. For pain      . zolpidem (AMBIEN) 10 MG tablet Take 5 mg by mouth at bedtime as needed.       Marland Kitchen azithromycin (ZITHROMAX) 250 MG tablet Take 250-500 mg by mouth daily. Take 2 tablets by mouth on day 1 then take one tablet by mouth for days 2-5      . pantoprazole (PROTONIX) 40 MG tablet Take 40 mg by mouth daily.        Allergies as of 03/24/2011 - Review Complete 03/24/2011  Allergen Reaction Noted  . Codeine Shortness Of Breath and Rash   . Levaquin Anaphylaxis 03/05/2011  . Sulfonamide derivatives Hives and Shortness Of Breath     Family History  Problem Relation Age of Onset  . Colon cancer      pt unsure but thinks no    History   Social History  . Marital Status: Married    Spouse Name: N/A    Number of Children: N/A  . Years of Education: N/A   Social History Main Topics  . Smoking status: Never Smoker   . Smokeless tobacco: None  . Alcohol Use: No  . Drug Use: No  .  Sexually Active: None   Other Topics Concern  . None   Social History Narrative  . None    Review of Systems: Gen: Denies fever, chills, anorexia. Denies fatigue, weakness, weight loss.  CV: Denies chest pain, palpitations, syncope, peripheral edema, and claudication. Resp: Denies dyspnea at rest, cough, wheezing, coughing up blood, and pleurisy. GI: Denies vomiting blood, jaundice, and fecal incontinence.   Denies dysphagia or odynophagia. Derm: Denies rash, itching, dry skin Psych: Denies depression, anxiety, memory loss, confusion. No homicidal or suicidal ideation.  Heme: Denies bruising, bleeding, and enlarged lymph nodes.  Physical Exam: BP 134/81  Pulse 75  Temp(Src) 97.6 F (36.4 C) (Temporal)  Ht 5\' 8"  (1.727 m)  Wt 140 lb 3.2 oz (63.594 kg)  BMI 21.32 kg/m2 General:   Alert and  oriented. No distress noted. Pleasant and cooperative.  Head:  Normocephalic and atraumatic. Eyes:  Conjuctiva clear without scleral icterus. Mouth:  Oral mucosa pink and moist. Poor dentition, multiple teeth absent.  Neck:  Supple, without mass or thyromegaly. Left posterior neck with fatty lipoma, golf-ball sized, chronic.  Heart:  S1, S2 present without murmurs, rubs, or gallops. Regular rate and rhythm. Abdomen:  +BS, soft, non-tender and non-distended. No rebound or guarding. No HSM or masses noted. Msk:  Symmetrical without gross deformities. Normal posture. Extremities:  Without edema. Neurologic:  Alert and  oriented x4;  grossly normal neurologically. Skin:  Intact without significant lesions or rashes. Cervical Nodes:  No significant cervical adenopathy. Psych:  Alert and cooperative. Normal mood and affect.

## 2011-03-25 ENCOUNTER — Encounter: Payer: Self-pay | Admitting: Gastroenterology

## 2011-03-25 DIAGNOSIS — D369 Benign neoplasm, unspecified site: Secondary | ICD-10-CM | POA: Insufficient documentation

## 2011-03-25 DIAGNOSIS — R1011 Right upper quadrant pain: Secondary | ICD-10-CM | POA: Insufficient documentation

## 2011-03-25 NOTE — Progress Notes (Signed)
Faxed to PCP

## 2011-03-25 NOTE — Assessment & Plan Note (Signed)
Hx of multiple tubulovillous adenomas on TCS Oct 2011, due for surveillance in Oct 2013.

## 2011-03-25 NOTE — Assessment & Plan Note (Signed)
77 year old female with 2-3 week hx of RUQ abdominal pain, wrapping around to back, prompting several visits to ED without findings for etiology. [place] of abdomen as outlined above, no clinical evidence of pancreatitis, no bump in LFTs or anemia. Does report use of Motrin daily prn, + early satiety, worsening reflux and belching despite PPI daily. Needs evaluation with EGD to assess for possible gastritis, PUD, underlying malignancy less likely. Would consider CT abd/pelvis if EGD benign for further evaluation of pancreas/liver, etc.   Proceed with upper endoscopy in the near future with Dr. Jena Gauss. The risks, benefits, and alternatives have been discussed in detail with patient. They have stated understanding and desire to proceed.

## 2011-03-29 ENCOUNTER — Ambulatory Visit: Payer: Medicare Other | Admitting: Gastroenterology

## 2011-04-15 ENCOUNTER — Telehealth: Payer: Self-pay | Admitting: Gastroenterology

## 2011-04-15 MED ORDER — SODIUM CHLORIDE 0.45 % IV SOLN
Freq: Once | INTRAVENOUS | Status: AC
  Administered 2011-04-16: 09:00:00 via INTRAVENOUS

## 2011-04-15 NOTE — Telephone Encounter (Signed)
Called and informed pt of new arrival time of 9:00- and procedure time of 10- She is ok with this

## 2011-04-16 ENCOUNTER — Ambulatory Visit (HOSPITAL_COMMUNITY)
Admission: RE | Admit: 2011-04-16 | Discharge: 2011-04-16 | Disposition: A | Discharge status: Home Health | Payer: Medicare Other | Source: Ambulatory Visit | Attending: Internal Medicine | Admitting: Internal Medicine

## 2011-04-16 ENCOUNTER — Encounter (HOSPITAL_COMMUNITY): Payer: Self-pay | Admitting: *Deleted

## 2011-04-16 ENCOUNTER — Other Ambulatory Visit: Payer: Self-pay | Admitting: Gastroenterology

## 2011-04-16 ENCOUNTER — Encounter (HOSPITAL_COMMUNITY)
Admission: RE | Disposition: A | Discharge status: Home Health | Payer: Self-pay | Source: Ambulatory Visit | Attending: Internal Medicine

## 2011-04-16 DIAGNOSIS — K222 Esophageal obstruction: Secondary | ICD-10-CM | POA: Diagnosis not present

## 2011-04-16 DIAGNOSIS — K259 Gastric ulcer, unspecified as acute or chronic, without hemorrhage or perforation: Secondary | ICD-10-CM | POA: Diagnosis not present

## 2011-04-16 DIAGNOSIS — R933 Abnormal findings on diagnostic imaging of other parts of digestive tract: Secondary | ICD-10-CM | POA: Diagnosis not present

## 2011-04-16 DIAGNOSIS — K449 Diaphragmatic hernia without obstruction or gangrene: Secondary | ICD-10-CM | POA: Diagnosis not present

## 2011-04-16 DIAGNOSIS — R1013 Epigastric pain: Secondary | ICD-10-CM | POA: Insufficient documentation

## 2011-04-16 DIAGNOSIS — K2960 Other gastritis without bleeding: Secondary | ICD-10-CM | POA: Diagnosis not present

## 2011-04-16 DIAGNOSIS — K2990 Gastroduodenitis, unspecified, without bleeding: Secondary | ICD-10-CM | POA: Diagnosis not present

## 2011-04-16 DIAGNOSIS — R109 Unspecified abdominal pain: Secondary | ICD-10-CM

## 2011-04-16 DIAGNOSIS — D131 Benign neoplasm of stomach: Secondary | ICD-10-CM

## 2011-04-16 DIAGNOSIS — R1011 Right upper quadrant pain: Secondary | ICD-10-CM

## 2011-04-16 DIAGNOSIS — K2970 Gastritis, unspecified, without bleeding: Secondary | ICD-10-CM | POA: Diagnosis not present

## 2011-04-16 HISTORY — DX: Anxiety disorder, unspecified: F41.9

## 2011-04-16 HISTORY — PX: ESOPHAGOGASTRODUODENOSCOPY: SHX5428

## 2011-04-16 SURGERY — EGD (ESOPHAGOGASTRODUODENOSCOPY)
Anesthesia: Moderate Sedation

## 2011-04-16 MED ORDER — MEPERIDINE HCL 100 MG/ML IJ SOLN
INTRAMUSCULAR | Status: DC | PRN
  Administered 2011-04-16 (×2): 25 mg via INTRAVENOUS

## 2011-04-16 MED ORDER — MIDAZOLAM HCL 5 MG/5ML IJ SOLN
INTRAMUSCULAR | Status: AC
  Filled 2011-04-16: qty 10

## 2011-04-16 MED ORDER — MIDAZOLAM HCL 5 MG/5ML IJ SOLN
INTRAMUSCULAR | Status: DC | PRN
  Administered 2011-04-16: 2 mg via INTRAVENOUS
  Administered 2011-04-16 (×2): 1 mg via INTRAVENOUS

## 2011-04-16 MED ORDER — STERILE WATER FOR IRRIGATION IR SOLN
Status: DC | PRN
  Administered 2011-04-16: 10:00:00

## 2011-04-16 MED ORDER — BUTAMBEN-TETRACAINE-BENZOCAINE 2-2-14 % EX AERO
INHALATION_SPRAY | CUTANEOUS | Status: DC | PRN
  Administered 2011-04-16: 1 via TOPICAL

## 2011-04-16 MED ORDER — MEPERIDINE HCL 100 MG/ML IJ SOLN
INTRAMUSCULAR | Status: AC
  Filled 2011-04-16: qty 2

## 2011-04-16 NOTE — Op Note (Signed)
[facility] [address] [place], [place]  [address]  ENDOSCOPY PROCEDURE REPORT  PATIENT:  Lacey, [name]  MR#:  [id number] BIRTHDATE:  03-Aug-1934, 76 yrs. old  GENDER:  female  ENDOSCOPIST:  R. Roetta Sessions, MD Caleen Essex Referred by:  Kari Baars, M.D.  PROCEDURE DATE:  04/16/2011 PROCEDURE:  EGD with gastric and duodenal biopsy  INDICATIONS:   epigastric and right upper quadrant pain. Refractory "heartburn"  INFORMED CONSENT:   The risks, benefits, limitations, alternatives and imponderables have been discussed.  The potential for biopsy, esophogeal dilation, etc. have also been reviewed.  Questions have been answered.  All parties agreeable.  Please see the history and physical in the medical record for more information.  MEDICATIONS: Versed 4 mg IV and Demerol 50 mg IV in divided doses. Cetacaine spray.  DESCRIPTION OF PROCEDURE:   The [id number] ([id number]) and [id number] ([id number]) endoscope was introduced through the mouth and advanced to the second portion of the duodenum without difficulty or limitations.  The mucosal surfaces were surveyed very carefully during advancement of the scope and upon withdrawal.  Retroflexion view of the proximal stomach and esophagogastric junction was performed.  <<PROCEDUREIMAGES>>  FINDINGS:  Noncritical Schatzki's ring; otherwise normal esophagus. Stomach with slight amount of retained gastric contents. Large     hiatal hernia. Mild antral erosions. No ulcer or infiltrating process.  Patent pylorus. Prominent lymphoid appearing     aggregates in the duodenal bulb;   otherwise the D1 and D2 appeared unremarkable.  THERAPEUTIC / DIAGNOSTIC MANEUVERS PERFORMED:   Biopsies of the gastric mucosa and duodenal bulb taken for histologic study  COMPLICATIONS:   None  IMPRESSION:  Noncritical Schatzki's ring (not manipulated because no dysphagia). Normal esophagus otherwise;   large hilar hernia. Gastric polyp  - status post  biopsy. Gastric erosions -  status post biopsy. Abnormal bulb - status post biopsy  RECOMMENDATIONS:   Continue Nexium 40 mg orally daily. Followup on pathology. CT of the abdomen and pelvis with IV and oral contrast to evaluate other causes of abdominal pain. Gallbladder grossly normal on ultrasound. May ultimately come to a HIDA. ______________________________ R. Roetta Sessions, MD Caleen Essex  CC:  n. eSIGNED:   R. Roetta Sessions at 04/16/2011 10:51 AM  [name], [phone]

## 2011-04-16 NOTE — Interval H&P Note (Signed)
History and Physical Interval Note:  04/16/2011 9:45 AM  [name]  has presented today for surgery, with the diagnosis of RUQ pain and GERD  The various methods of treatment have been discussed with the patient and family. After consideration of risks, benefits and other options for treatment, the patient has consented to  Procedure(s) (LRB): ESOPHAGOGASTRODUODENOSCOPY (EGD) (N/A) as a surgical intervention .  The patients' history has been reviewed, patient examined, no change in status, stable for surgery.  I have reviewed the patients' chart and labs.  Questions were answered to the patient's satisfaction.     [name]

## 2011-04-16 NOTE — Discharge Instructions (Signed)
EGD Discharge instructions Please read the instructions outlined below and refer to this sheet in the next few weeks. These discharge instructions provide you with general information on caring for yourself after you leave the hospital. Your doctor may also give you specific instructions. While your treatment has been planned according to the most current medical practices available, unavoidable complications occasionally occur. If you have any problems or questions after discharge, please call your doctor. ACTIVITY  You may resume your regular activity but move at a slower pace for the next 24 hours.   Take frequent rest periods for the next 24 hours.   Walking will help expel (get rid of) the air and reduce the bloated feeling in your abdomen.   No driving for 24 hours (because of the anesthesia (medicine) used during the test).   You may shower.   Do not sign any important legal documents or operate any machinery for 24 hours (because of the anesthesia used during the test).  NUTRITION  Drink plenty of fluids.   You may resume your normal diet.   Begin with a light meal and progress to your normal diet.   Avoid alcoholic beverages for 24 hours or as instructed by your caregiver.  MEDICATIONS  You may resume your normal medications unless your caregiver tells you otherwise.  WHAT YOU CAN EXPECT TODAY  You may experience abdominal discomfort such as a feeling of fullness or "gas" pains.  FOLLOW-UP  Your doctor will discuss the results of your test with you.  SEEK IMMEDIATE MEDICAL ATTENTION IF ANY OF THE FOLLOWING OCCUR:  Excessive nausea (feeling sick to your stomach) and/or vomiting.   Severe abdominal pain and distention (swelling).   Trouble swallowing.   Temperature over 101 F (37.8 C).   Rectal bleeding or vomiting of blood.    CT scan of the abdomen and pelvis with IV and oral contrast to further evaluate abdominal pain. Negative EGD and gallbladder  ultrasound  Further recommendations to follow pending review of pathology report.

## 2011-04-16 NOTE — H&P (View-Only) (Signed)
Referring Provider: Fredirick Maudlin, MD Primary Care Physician:  Fredirick Maudlin, MD, MD Primary Gastroenterologist: Dr. Jena Gauss   Chief Complaint  Patient presents with  . Abdominal Pain    HPI:   Ms. Osoria presents today at the request of Dr. Juanetta Gosling due to RUQ abdominal pain. She is known to [place] previously, undergoing a colonoscopy in Oct 2011 with multiple tubulovillous adenomas. Due for surveillance Oct 2013.  She notes 2-3 week hx of RUQ pain, wrapping around to back. Denies epigastric pain. Described as burning, not worsened by eating/drinking. Notes intermittent, no N/V. Sitting makes worse. +belching, +reflux worsening.   Was on Protonix, then switched to Nexium. No dysphagia. No black tarry stools. Takes Motrin prn, once per day.  No fever or chills. No loss of appetite or unexplained wt loss. + early satiety.   She has been to the ED several times in March, diagnosed with UTI at one point. [place] of abdomen essentially benign, labs ok. No evidence of pancreatitis via labs. No CT on file.   [place] 03/09/11:  IMPRESSION:  CBD WNL, liver WNL Pancreas obscured.  Benign-appearing cyst in the right kidney.  Increased renal cortical echogenicity compatible with medical renal  parenchymal disease.  03/2011 labs CBC normal CMP normal Lipase 23, normal   Past Medical History  Diagnosis Date  . High cholesterol   . Osteoporosis   . Acid reflux     Past Surgical History  Procedure Date  . Colonoscopy 10/2009    sigmoid diverticula, multiple tubulovillous adneomas, needs surveillance Oct 2013    Current Outpatient Prescriptions  Medication Sig Dispense Refill  . ALPRAZolam (XANAX) 0.5 MG tablet Take 0.5 mg by mouth at bedtime as needed.       . cetirizine (ZYRTEC) 10 MG tablet Take 10 mg by mouth daily.      Marland Kitchen esomeprazole (NEXIUM) 40 MG capsule Take 40 mg by mouth daily before breakfast.      . HYDROcodone-acetaminophen (VICODIN) 5-500 MG per tablet Take 1 tablet by mouth  every 6 (six) hours as needed. For Pain      . simvastatin (ZOCOR) 40 MG tablet Take 40 mg by mouth every evening.      . traMADol (ULTRAM) 50 MG tablet Take 50 mg by mouth every 6 (six) hours as needed. For pain      . zolpidem (AMBIEN) 10 MG tablet Take 5 mg by mouth at bedtime as needed.       Marland Kitchen azithromycin (ZITHROMAX) 250 MG tablet Take 250-500 mg by mouth daily. Take 2 tablets by mouth on day 1 then take one tablet by mouth for days 2-5      . pantoprazole (PROTONIX) 40 MG tablet Take 40 mg by mouth daily.        Allergies as of 03/24/2011 - Review Complete 03/24/2011  Allergen Reaction Noted  . Codeine Shortness Of Breath and Rash   . Levaquin Anaphylaxis 03/05/2011  . Sulfonamide derivatives Hives and Shortness Of Breath     Family History  Problem Relation Age of Onset  . Colon cancer      pt unsure but thinks no    History   Social History  . Marital Status: Married    Spouse Name: N/A    Number of Children: N/A  . Years of Education: N/A   Social History Main Topics  . Smoking status: Never Smoker   . Smokeless tobacco: None  . Alcohol Use: No  . Drug Use: No  .  Sexually Active: None   Other Topics Concern  . None   Social History Narrative  . None    Review of Systems: Gen: Denies fever, chills, anorexia. Denies fatigue, weakness, weight loss.  CV: Denies chest pain, palpitations, syncope, peripheral edema, and claudication. Resp: Denies dyspnea at rest, cough, wheezing, coughing up blood, and pleurisy. GI: Denies vomiting blood, jaundice, and fecal incontinence.   Denies dysphagia or odynophagia. Derm: Denies rash, itching, dry skin Psych: Denies depression, anxiety, memory loss, confusion. No homicidal or suicidal ideation.  Heme: Denies bruising, bleeding, and enlarged lymph nodes.  Physical Exam: BP 134/81  Pulse 75  Temp(Src) 97.6 F (36.4 C) (Temporal)  Ht 5\' 8"  (1.727 m)  Wt 140 lb 3.2 oz (63.594 kg)  BMI 21.32 kg/m2 General:   Alert and  oriented. No distress noted. Pleasant and cooperative.  Head:  Normocephalic and atraumatic. Eyes:  Conjuctiva clear without scleral icterus. Mouth:  Oral mucosa pink and moist. Poor dentition, multiple teeth absent.  Neck:  Supple, without mass or thyromegaly. Left posterior neck with fatty lipoma, golf-ball sized, chronic.  Heart:  S1, S2 present without murmurs, rubs, or gallops. Regular rate and rhythm. Abdomen:  +BS, soft, non-tender and non-distended. No rebound or guarding. No HSM or masses noted. Msk:  Symmetrical without gross deformities. Normal posture. Extremities:  Without edema. Neurologic:  Alert and  oriented x4;  grossly normal neurologically. Skin:  Intact without significant lesions or rashes. Cervical Nodes:  No significant cervical adenopathy. Psych:  Alert and cooperative. Normal mood and affect.

## 2011-04-19 ENCOUNTER — Telehealth: Payer: Self-pay | Admitting: General Practice

## 2011-04-19 NOTE — Telephone Encounter (Signed)
Per Dr. Jena Gauss pt needs to have ct scan of the abd and pelvis with cm.  Pt is scheduled for 04/21/2011@ 3:00pm.  Pt was instructed to pick up contrast before Wednesday.     Creatinine was 0.74 on 03/21/2011

## 2011-04-21 ENCOUNTER — Encounter (HOSPITAL_COMMUNITY): Payer: Self-pay | Admitting: Internal Medicine

## 2011-04-21 ENCOUNTER — Ambulatory Visit (HOSPITAL_COMMUNITY)
Admission: RE | Admit: 2011-04-21 | Discharge: 2011-04-21 | Disposition: A | Discharge status: Home / Self Care | Payer: Medicare Other | Source: Ambulatory Visit | Attending: Internal Medicine | Admitting: Internal Medicine

## 2011-04-21 DIAGNOSIS — K449 Diaphragmatic hernia without obstruction or gangrene: Secondary | ICD-10-CM | POA: Diagnosis not present

## 2011-04-21 DIAGNOSIS — K5730 Diverticulosis of large intestine without perforation or abscess without bleeding: Secondary | ICD-10-CM | POA: Diagnosis not present

## 2011-04-21 DIAGNOSIS — R109 Unspecified abdominal pain: Secondary | ICD-10-CM | POA: Diagnosis not present

## 2011-04-21 MED ORDER — IOHEXOL 300 MG/ML  SOLN
100.0000 mL | Freq: Once | INTRAMUSCULAR | Status: AC | PRN
  Administered 2011-04-21: 100 mL via INTRAVENOUS

## 2011-04-22 ENCOUNTER — Telehealth: Payer: Self-pay | Admitting: Internal Medicine

## 2011-04-22 NOTE — Telephone Encounter (Signed)
Please call patient when her results are ready.

## 2011-04-22 NOTE — Telephone Encounter (Signed)
Routed to RMR 

## 2011-04-23 ENCOUNTER — Encounter: Payer: Self-pay | Admitting: Internal Medicine

## 2011-04-26 ENCOUNTER — Other Ambulatory Visit: Payer: Self-pay | Admitting: Gastroenterology

## 2011-04-26 ENCOUNTER — Encounter: Payer: Self-pay | Admitting: Internal Medicine

## 2011-04-26 NOTE — Progress Notes (Signed)
Quick Note:  Pt aware, ok to set up Hida scan.  Crystal, please schedule. ______

## 2011-04-27 ENCOUNTER — Other Ambulatory Visit: Payer: Self-pay | Admitting: Internal Medicine

## 2011-04-27 ENCOUNTER — Ambulatory Visit: Payer: Medicare Other | Admitting: Internal Medicine

## 2011-04-28 ENCOUNTER — Encounter (HOSPITAL_COMMUNITY): Payer: Self-pay

## 2011-04-28 ENCOUNTER — Emergency Department (HOSPITAL_COMMUNITY)
Admission: EM | Admit: 2011-04-28 | Discharge: 2011-04-28 | Disposition: A | Discharge status: Home / Self Care | Payer: Medicare Other | Attending: Emergency Medicine | Admitting: Emergency Medicine

## 2011-04-28 ENCOUNTER — Telehealth (INDEPENDENT_AMBULATORY_CARE_PROVIDER_SITE_OTHER): Payer: Self-pay | Admitting: Internal Medicine

## 2011-04-28 ENCOUNTER — Encounter (HOSPITAL_COMMUNITY): Payer: Self-pay | Admitting: Emergency Medicine

## 2011-04-28 ENCOUNTER — Encounter (HOSPITAL_COMMUNITY)
Admission: RE | Admit: 2011-04-28 | Discharge: 2011-04-28 | Disposition: A | Discharge status: Home / Self Care | Payer: Medicare Other | Source: Ambulatory Visit | Attending: Internal Medicine | Admitting: Internal Medicine

## 2011-04-28 DIAGNOSIS — R112 Nausea with vomiting, unspecified: Secondary | ICD-10-CM | POA: Insufficient documentation

## 2011-04-28 DIAGNOSIS — R197 Diarrhea, unspecified: Secondary | ICD-10-CM | POA: Diagnosis not present

## 2011-04-28 DIAGNOSIS — R109 Unspecified abdominal pain: Secondary | ICD-10-CM | POA: Insufficient documentation

## 2011-04-28 DIAGNOSIS — M810 Age-related osteoporosis without current pathological fracture: Secondary | ICD-10-CM | POA: Insufficient documentation

## 2011-04-28 DIAGNOSIS — E7800 Pure hypercholesterolemia, unspecified: Secondary | ICD-10-CM | POA: Diagnosis not present

## 2011-04-28 DIAGNOSIS — K921 Melena: Secondary | ICD-10-CM | POA: Diagnosis not present

## 2011-04-28 DIAGNOSIS — K5730 Diverticulosis of large intestine without perforation or abscess without bleeding: Secondary | ICD-10-CM | POA: Diagnosis not present

## 2011-04-28 DIAGNOSIS — K219 Gastro-esophageal reflux disease without esophagitis: Secondary | ICD-10-CM | POA: Diagnosis not present

## 2011-04-28 DIAGNOSIS — K449 Diaphragmatic hernia without obstruction or gangrene: Secondary | ICD-10-CM | POA: Insufficient documentation

## 2011-04-28 MED ORDER — ONDANSETRON HCL 4 MG/2ML IJ SOLN
4.0000 mg | Freq: Once | INTRAMUSCULAR | Status: AC
  Administered 2011-04-28: 4 mg via INTRAVENOUS
  Filled 2011-04-28: qty 2

## 2011-04-28 MED ORDER — SODIUM CHLORIDE 0.9 % IV BOLUS (SEPSIS)
1000.0000 mL | Freq: Once | INTRAVENOUS | Status: AC
  Administered 2011-04-28: 1000 mL via INTRAVENOUS

## 2011-04-28 MED ORDER — SINCALIDE 5 MCG IJ SOLR
INTRAMUSCULAR | Status: AC
  Administered 2011-04-28: 1.27 ug
  Filled 2011-04-28: qty 5

## 2011-04-28 MED ORDER — TECHNETIUM TC 99M MEBROFENIN IV KIT
5.0000 | PACK | Freq: Once | INTRAVENOUS | Status: AC | PRN
  Administered 2011-04-28: 5.5 via INTRAVENOUS

## 2011-04-28 MED ORDER — PANTOPRAZOLE SODIUM 40 MG IV SOLR
40.0000 mg | Freq: Once | INTRAVENOUS | Status: AC
  Administered 2011-04-28: 40 mg via INTRAVENOUS
  Filled 2011-04-28: qty 40

## 2011-04-28 NOTE — ED Provider Notes (Signed)
History     CSN: [id number]  Arrival date & time 04/28/11  4098   First MD Initiated Contact with Patient 04/28/11 1945      Chief Complaint  Patient presents with  . Nausea  . Emesis  . Diarrhea    (Consider location/radiation/quality/duration/timing/severity/associated sxs/prior treatment) HPI [name] is a 77 y.o. female with the history of recent multiple visits for abdominal pain who presents to the Emergency Department complaining of nausea, vomiting, and diarrhea which had blood in it. She is being worked up by Dr. Jena Gauss, GI for recurrent abdominal pain.she has had abdominal ultrasounds done on 3 01/07/2011 as well as 03/09/2011 both of which were normal. She has had a CT of the abdomen done on 04/16/2011 without any acute findings. Today she had a nuclear medicine gallbladder emptying study done which was also normal. She has antibiotics at home and took them after her vomiting episode tonight she continued to feel nauseated. She has had diarrhea off and on for 3 weeks. This is not the first time she has seen blood in the stool with diarrhea.  PCP Dr. Juanetta Gosling GI Dr. Jena Gauss  Past Medical History  Diagnosis Date  . High cholesterol   . Osteoporosis   . Acid reflux   . Anxiety     Past Surgical History  Procedure Date  . Colonoscopy 10/2009    sigmoid diverticula, multiple tubulovillous adneomas, needs surveillance Oct 2013  . Esophagogastroduodenoscopy 04/16/2011    Procedure: ESOPHAGOGASTRODUODENOSCOPY (EGD);  Surgeon: Corbin Ade, MD;  Location: [facility];  Service: Endoscopy;  Laterality: N/A;  9:30    Family History  Problem Relation Age of Onset  . Colon cancer Neg Hx     History  Substance Use Topics  . Smoking status: Never Smoker   . Smokeless tobacco: Not on file  . Alcohol Use: No    OB History    Grav Para Term Preterm Abortions TAB SAB Ect Mult Living                  Review of Systems  Constitutional: Negative for fever.       10  Systems reviewed and are negative for acute change except as noted in the HPI.  HENT: Negative for congestion.   Eyes: Negative for discharge and redness.  Respiratory: Negative for cough and shortness of breath.   Cardiovascular: Negative for chest pain.  Gastrointestinal: Positive for nausea, vomiting, abdominal pain, diarrhea and blood in stool.  Musculoskeletal: Negative for back pain.  Skin: Negative for rash.  Neurological: Negative for syncope, numbness and headaches.  Psychiatric/Behavioral:       No behavior change.    Allergies  Codeine; Levaquin; and Sulfonamide derivatives  Home Medications   Current Outpatient Rx  Name Route Sig Dispense Refill  . ALPRAZOLAM 0.5 MG PO TABS Oral Take 0.5 mg by mouth at bedtime as needed.     Marland Kitchen CETIRIZINE HCL 10 MG PO TABS Oral Take 10 mg by mouth daily.    Marland Kitchen DIAZEPAM 2 MG PO TABS Oral Take 2 mg by mouth Once daily as needed. For anxiety    . ESOMEPRAZOLE MAGNESIUM 40 MG PO CPDR Oral Take 40 mg by mouth daily before breakfast.    . HYDROCODONE-ACETAMINOPHEN 5-500 MG PO TABS Oral Take 1 tablet by mouth every 6 (six) hours as needed. For Pain    . PROMETHAZINE HCL 25 MG PO TABS Oral Take 25 mg by mouth Once daily as needed.  For nausea    . SIMVASTATIN 40 MG PO TABS Oral Take 40 mg by mouth every evening.    Marland Kitchen TEMAZEPAM 15 MG PO CAPS Oral Take 15 mg by mouth at bedtime.    . TRAMADOL HCL 50 MG PO TABS Oral Take 50 mg by mouth every 6 (six) hours as needed. For pain    . ZOLPIDEM TARTRATE 10 MG PO TABS Oral Take 5 mg by mouth at bedtime as needed.       BP 132/85  Pulse 87  Temp(Src) 97.9 F (36.6 C) (Oral)  Resp 18  Ht 5\' 7"  (1.702 m)  Wt 140 lb (63.504 kg)  BMI 21.93 kg/m2  SpO2 99%  Physical Exam  Nursing note and vitals reviewed. Constitutional:       Awake, alert, nontoxic appearance.  HENT:  Head: Atraumatic.  Eyes: Right eye exhibits no discharge. Left eye exhibits no discharge.  Neck: Neck supple.  Cardiovascular:  Normal rate, normal heart sounds and intact distal pulses.   Pulmonary/Chest: Effort normal. She exhibits no tenderness.  Abdominal: Soft. Bowel sounds are normal. She exhibits no distension. There is no tenderness. There is no rebound and no guarding.  Musculoskeletal: She exhibits no tenderness.       Baseline ROM, no obvious new focal weakness.  Neurological:       Mental status and motor strength appears baseline for patient and situation.  Skin: No rash noted.  Psychiatric: She has a normal mood and affect.    ED Course  Procedures (including critical care time) .ris Ct Abdomen Pelvis W Contrast  04/21/2011  *RADIOLOGY REPORT*  Clinical Data: Abdominal pain.  CT ABDOMEN AND PELVIS WITH CONTRAST  Technique:  Multidetector CT imaging of the abdomen and pelvis was performed following the standard protocol during bolus administration of intravenous contrast.  Contrast: OMNIPAQUE IOHEXOL 300 MG/ML  SOLN  Comparison: 09/26/2009  Findings: A large hiatal hernia is again demonstrated.  No evidence of gastric volvulus. A tiny sub-centimeter hepatic cyst is again seen but no liver masses are identified.  Gallbladder is unremarkable.  The spleen, pancreas, and adrenal glands are normal in appearance.  A few tiny bilateral renal cysts remains stable as well as mild chronic right renal pelvicaliectasis.  No soft tissue masses or lymphadenopathy identified within the abdomen or pelvis. Uterus and adnexa are unremarkable.  Diverticulosis is again seen involving the descending and sigmoid portions of the colon, however there is no evidence of diverticulitis.  No other inflammatory process or abnormal fluid collections are seen within the abdomen or pelvis.  No evidence of bowel wall thickening or dilatation.  IMPRESSION:  1.  No acute findings. 2.  Large hiatal hernia. 3.  Diverticulosis.  No radiographic evidence of diverticulitis.  Original Report Authenticated By: Danae Orleans, M.D.   Nm Hepato  W/eject Fract  04/28/2011  *RADIOLOGY REPORT*  Clinical Data:  Abdominal pain.  NUCLEAR MEDICINE HEPATOBILIARY IMAGING WITH GALLBLADDER EF  Technique:  Sequential images of the abdomen were obtained out to 60 minutes following intravenous administration of radiopharmaceutical.  After slow intravenous infusion of 1.27 micrograms Cholecystokinin, gallbladder ejection fraction was determined.  Radiopharmaceutical:  5.5 mCi Tc-25m Choletec  Comparison:  CT scan 04/21/2011.  Findings: There is symmetric uptake in the liver and prompt excretion into the biliary tree which is visualized by 10 minutes. The gallbladder is visualized at .  Activity is noted in the small bowel at if the minutes.  The patient received a protocoled infusion  of CCK and a gallbladder ejection fraction was estimated at 49%.  Normal is greater than 30%.  The patient did not experience symptoms during CCK infusion.  IMPRESSION:  Normal biliary patency study and gallbladder ejection fraction.  Original Report Authenticated By: P. Loralie Champagne, M.D.     MDM  Patient here with nausea, vomiting, diarrhea with blood in the stool after having a nuclear medicine hepatobiliary imaging study. Given IV fluids, antiemetics, PPI. Able to take by mouth fluids here in the emergency room. Vital signs are stable, patient ambulated in the hallway without difficulty, and has had no nausea, vomiting, or diarrhea since arrival. She has followup with her gastroenterologist, Dr.Rourk.  Pt feels improved after observation and/or treatment in ED.Pt stable in ED with no significant deterioration in condition.The patient appears reasonably screened and/or stabilized for discharge and I doubt any other medical condition or other [facility] requiring further screening, evaluation, or treatment in the ED at this time prior to discharge.  MDM Reviewed: previous chart, nursing note and vitals Reviewed previous: labs, x-ray, ultrasound and CT  scan           Nicoletta Dress. Colon Branch, MD 04/28/11 2117

## 2011-04-28 NOTE — ED Notes (Signed)
Pt had a procedure on her gallbladder today and now has N/V/D with blood in stool

## 2011-04-28 NOTE — Telephone Encounter (Signed)
[name] daughter Ms. [name] called stating that her mother was vomiting and also passing blood per rectum. She was also complaining of dizziness and lightheadedness on standing up. She had a HIDA scan earlier today which was normal. I recommended the patient be taken to the emergency room for evaluation

## 2011-04-28 NOTE — Discharge Instructions (Signed)
Continue your home medicines. Use a clear liquid diet for the next 8-10 hours then a BRAT diet to help with diarrhea. Followup with Dr. Jena Gauss.    Clear Liquid Diet The clear liquid dietconsists of foods that are liquid or will become liquid at room temperature.You should be able to see through the liquid and beverages. Examples of foods allowed on a clear liquid diet include fruit juice, broth or bouillon, gelatin, or frozen ice pops. The purpose of this diet is to provide necessary fluid, electrolytes such as sodium and potassium, and energy to keep the body functioning during times when you are not able to consume a regular diet.A clear liquid diet should not be continued for long periods of time as it is not nutritionally adequate.  REASONS FOR USING A CLEAR LIQUID DIET  In sudden onset (acute) conditions for a patient before or after surgery.   As the first step in oral feeding.   For fluid and electrolyte replacement in diarrheal diseases.   As a diet before certain medical tests are performed.  ADEQUACY The clear liquid diet is adequate only in ascorbic acid, according to the Recommended Dietary Allowances of the [employer]. CHOOSING FOODS Breads and Starches  Allowed:  None are allowed.   Avoid: All are avoided.  Vegetables  Allowed:  Strained tomato or vegetable juice.   Avoid: Any others.  Fruit  Allowed:  Strained fruit juices and fruit drinks. Include 1 serving of citrus or vitamin C-enriched fruit juice daily.   Avoid: Any others.  Meat and Meat Substitutes  Allowed:  None are allowed.   Avoid: All are avoided.  Milk  Allowed:  None are allowed.   Avoid: All are avoided.  Soups and Combination Foods  Allowed:  Clear bouillon, broth, or strained broth-based soups.   Avoid: Any others.  Desserts and Sweets  Allowed:  Sugar, honey. High protein gelatin. Flavored gelatin, ices, or frozen ice pops that do not contain milk.   Avoid: Any  others.  Fats and Oils  Allowed:  None are allowed.   Avoid: All are avoided.  Beverages  Allowed: Cereal beverages, coffee (regular or decaffeinated), tea, or soda at the discretion of your caregiver.   Avoid: Any others.  Condiments  Allowed:  Iodized salt.   Avoid: Any others, including pepper.  Supplements  Allowed:  Liquid nutrition beverages.   Avoid: Any others that contain lactose or fiber.  SAMPLE MEAL PLAN Breakfast  4 oz (120 mL) strained orange juice.    to 1 cup (125 to 250 mL) gelatin (plain or fortified).   1 cup (250 mL) beverage (coffee or tea).   Sugar, if desired.  Midmorning Snack   cup (125 mL) gelatin (plain or fortified).  Lunch  1 cup (250 mL) broth or consomm.   4 oz (120 mL) strained grapefruit juice.    cup (125 mL) gelatin (plain or fortified).   1 cup (250 mL) beverage (coffee or tea).   Sugar, if desired.  Midafternoon Snack   cup (125 mL) fruit ice.    cup (125 mL) strained fruit juice.  Dinner  1 cup (250 mL) broth or consomm.    cup (125 mL) cranberry juice.    cup (125 mL) flavored gelatin (plain or fortified).   1 cup (250 mL) beverage (coffee or tea).   Sugar, if desired.  Evening Snack  4 oz (120 mL) strained apple juice (vitamin C-fortified).    cup (125 mL) flavored gelatin (plain  or fortified).  Document Released: 12/21/2004 Document Revised: 12/10/2010 Document Reviewed: 03/20/2010 [facility] Patient Information 2012 [place], [place].   B.R.A.T. Diet Your doctor has recommended the B.R.A.T. diet for you or your child until the condition improves. This is often used to help control diarrhea and vomiting symptoms. If you or your child can tolerate clear liquids, you may have:  Bananas.   Rice.   Applesauce.   Toast (and other simple starches such as crackers, potatoes, noodles).  Be sure to avoid dairy products, meats, and fatty foods until symptoms are better. Fruit juices such as apple,  grape, and prune juice can make diarrhea worse. Avoid these. Continue this diet for 2 days or as instructed by your caregiver. Document Released: 12/21/2004 Document Revised: 12/10/2010 Document Reviewed: 06/09/2006 [facility] Patient Information 2012 [place], [place].

## 2011-04-29 ENCOUNTER — Encounter: Payer: Self-pay | Admitting: Urgent Care

## 2011-04-29 ENCOUNTER — Telehealth: Payer: Self-pay | Admitting: Internal Medicine

## 2011-04-29 ENCOUNTER — Encounter: Payer: Self-pay | Admitting: Internal Medicine

## 2011-04-29 ENCOUNTER — Ambulatory Visit (INDEPENDENT_AMBULATORY_CARE_PROVIDER_SITE_OTHER): Payer: Medicare Other | Admitting: Urgent Care

## 2011-04-29 VITALS — BP 125/85 | HR 85 | Temp 99.1°F | Ht 64.0 in | Wt 139.0 lb

## 2011-04-29 DIAGNOSIS — R1011 Right upper quadrant pain: Secondary | ICD-10-CM

## 2011-04-29 DIAGNOSIS — D369 Benign neoplasm, unspecified site: Secondary | ICD-10-CM

## 2011-04-29 DIAGNOSIS — R110 Nausea: Secondary | ICD-10-CM

## 2011-04-29 DIAGNOSIS — K649 Unspecified hemorrhoids: Secondary | ICD-10-CM

## 2011-04-29 DIAGNOSIS — R197 Diarrhea, unspecified: Secondary | ICD-10-CM | POA: Diagnosis not present

## 2011-04-29 DIAGNOSIS — K921 Melena: Secondary | ICD-10-CM

## 2011-04-29 MED ORDER — HYDROCORTISONE ACETATE 25 MG RE SUPP: 25.0000 mg | Freq: Two times a day (BID) | RECTAL | Status: DC

## 2011-04-29 NOTE — Patient Instructions (Addendum)
Go get your labs now Begin Anusol suppositories twice daily for 10 days If you continue to have diarrhea and bleeding you will need to call me back  Recent CT of abdomen and pelvis with w/ IV contrast showed large hiatal hernia & diverticulosis.  B.R.A.T. Diet Your doctor has recommended the B.R.A.T. diet for you or your child until the condition improves. This is often used to help control diarrhea and vomiting symptoms. If you or your child can tolerate clear liquids, you may have:  Bananas.   Rice.   Applesauce.   Toast (and other simple starches such as crackers, potatoes, noodles).  Be sure to avoid dairy products, meats, and fatty foods until symptoms are better. Fruit juices such as apple, grape, and prune juice can make diarrhea worse. Avoid these. Continue this diet for 2 days or as instructed by your caregiver. Document Released: 12/21/2004 Document Revised: 12/10/2010 Document Reviewed: 06/09/2006 [facility] Patient Information 2012 [place], [place].

## 2011-04-29 NOTE — Telephone Encounter (Signed)
Pt aware, please schedule asap appt.

## 2011-04-29 NOTE — Telephone Encounter (Signed)
Pt called to let us know that she had her gallbladder test done yesterday and now she is vomiting, has diarrhea and is wiping blood with she goes to the bathroom. Please advise and call patient at [phone]

## 2011-04-29 NOTE — Telephone Encounter (Signed)
Pt is aware of OV to come in today at 1pm to see KJ. Pt agreed

## 2011-04-29 NOTE — Telephone Encounter (Signed)
HIDA scan okay; needs office visit with extender between now and the first of next week

## 2011-04-29 NOTE — Progress Notes (Signed)
Primary Care Physician:  Fredirick Maudlin, MD, MD Primary Gastroenterologist: Dr. Jena Gauss   Chief Complaint  Patient presents with  . Diarrhea  . Rectal Bleeding  . Emesis    HPI: [name] is a 77 y.o. female  with hx RUQ pain.  She had a HIDA scan yesterday which was normal, however about an hour after arriving home, she began to have severe nausea and right upper quadrant pain. She denies any pain immediately after injection. She is having one large loose bloody stool. She describes it as "like a period". She doesn't she still felt sick with nausea all night long. History of GERD, was previously on Protonix & then switched to Nexium. No dysphagia. Denies any recent NSAIDs. Denies fever or chills.  Recent EGD 04/16/11 ulcerated gastric antral mucosa with foveolar hyperplasia on gastric biopsies, gastric polyp, noncritical Schatzki's ring.  Previous workup includes colonoscopy October 2001 with multiple tubulovillous adenomas. Due October 2013 for surveillance. Recent CT of abdomen and pelvis with w/ IV contrast showed large hiatal hernia & diverticulosis. [place] 03/09/11: Benign-appearing cyst in the right kidney. Increased renal cortical echogenicity compatible with medical renal parenchymal disease.  Past Medical History  Diagnosis Date  . High cholesterol   . Osteoporosis   . Acid reflux   . Anxiety     Past Surgical History  Procedure Date  . Colonoscopy 10/2009    sigmoid diverticula, multiple tubulovillous adneomas, needs surveillance Oct 2013  . Esophagogastroduodenoscopy 04/16/2011    Procedure: ESOPHAGOGASTRODUODENOSCOPY (EGD);  Surgeon: Corbin Ade, MD;  Location: [facility];  Service: Endoscopy;  Laterality: N/A;  9:30    Current Outpatient Prescriptions  Medication Sig Dispense Refill  . ALPRAZolam (XANAX) 0.5 MG tablet Take 0.5 mg by mouth at bedtime as needed.       . cetirizine (ZYRTEC) 10 MG tablet Take 10 mg by mouth daily.      . diazepam (VALIUM) 2 MG tablet  Take 2 mg by mouth Once daily as needed. For anxiety      . esomeprazole (NEXIUM) 40 MG capsule Take 40 mg by mouth daily before breakfast.      . HYDROcodone-acetaminophen (VICODIN) 5-500 MG per tablet Take 1 tablet by mouth every 6 (six) hours as needed. For Pain      . promethazine (PHENERGAN) 25 MG tablet Take 25 mg by mouth Once daily as needed. For nausea      . simvastatin (ZOCOR) 40 MG tablet Take 40 mg by mouth every evening.      . temazepam (RESTORIL) 15 MG capsule Take 15 mg by mouth at bedtime.      . traMADol (ULTRAM) 50 MG tablet Take 50 mg by mouth every 6 (six) hours as needed. For pain      . zolpidem (AMBIEN) 10 MG tablet Take 5 mg by mouth at bedtime as needed.       . hydrocortisone (ANUSOL-HC) 25 MG suppository Place 1 suppository (25 mg total) rectally 2 (two) times daily.  12 suppository  1    Allergies as of 04/29/2011 - Review Complete 04/29/2011  Allergen Reaction Noted  . Codeine Shortness Of Breath and Rash   . Levaquin Anaphylaxis 03/05/2011  . Sulfonamide derivatives Hives and Shortness Of Breath   Review of Systems: Gen: Not sleeping due to symptoms CV: Denies chest pain, angina, palpitations, syncope, orthopnea, PND, peripheral edema, and claudication. Resp: Denies dyspnea at rest, dyspnea with exercise, cough, sputum, wheezing, coughing up blood, and pleurisy. GI: Denies  vomiting blood, jaundice, and fecal incontinence.  . Derm: Denies rash, itching, dry skin, hives, moles, warts, or unhealing ulcers.  Psych: Denies depression, anxiety, memory loss, suicidal ideation, hallucinations, paranoia, and confusion. Heme: Denies bruising, bleeding, and enlarged lymph nodes.   Physical Exam: BP 125/85  Pulse 85  Temp(Src) 99.1 F (37.3 C) (Temporal)  Ht 5\' 4"  (1.626 m)  Wt 139 lb (63.05 kg)  BMI 23.86 kg/m2 General:   Alert,  Well-developed, well-nourished, pleasant and cooperative in NAD. Accompanied by granddaughter. Head:  Normocephalic and  atraumatic. Eyes:  Sclera clear, no icterus.   Conjunctiva pink. Mouth:  No deformity or lesions.  Oropharynx pink & moist. Neck:  Supple; no masses or thyromegaly. Heart:  Regular rate and rhythm; no murmurs, clicks, rubs,  or gallops. Abdomen:  Soft, nontender and nondistended. No masses, hepatosplenomegaly or hernias noted. Normal bowel sounds, without guarding, and without rebound.   Rectal: Small external hemorrhoid at 6:00. Stigmata of recent bleed. No internal masses palpated positive tenderness. Msk:  Symmetrical without gross deformities. Normal posture. Pulses:  Normal pulses noted. Extremities:  Without clubbing or edema. Neurologic:  Alert and  oriented x4;  grossly normal neurologically. Skin:  Intact without significant lesions or rashes. Cervical Nodes:  No significant cervical adenopathy. Psych:  Alert and cooperative. Normal mood and affect.

## 2011-04-30 ENCOUNTER — Telehealth: Payer: Self-pay | Admitting: Urgent Care

## 2011-04-30 ENCOUNTER — Encounter: Payer: Self-pay | Admitting: Urgent Care

## 2011-04-30 DIAGNOSIS — K921 Melena: Secondary | ICD-10-CM | POA: Insufficient documentation

## 2011-04-30 DIAGNOSIS — R110 Nausea: Secondary | ICD-10-CM | POA: Insufficient documentation

## 2011-04-30 LAB — CBC WITH DIFFERENTIAL/PLATELET
Basophils Absolute: 0 10*3/uL (ref 0.0–0.1)
Eosinophils Absolute: 0.1 10*3/uL (ref 0.0–0.7)
Eosinophils Relative: 1 % (ref 0–5)
HCT: 40.1 % (ref 36.0–46.0)
Lymphocytes Relative: 21 % (ref 12–46)
MCH: 27.8 pg (ref 26.0–34.0)
MCV: 87 fL (ref 78.0–100.0)
Monocytes Absolute: 0.9 10*3/uL (ref 0.1–1.0)
RDW: 13.1 % (ref 11.5–15.5)
WBC: 8.1 10*3/uL (ref 4.0–10.5)

## 2011-04-30 LAB — HEPATIC FUNCTION PANEL
ALT: 16 U/L (ref 0–35)
Albumin: 4.9 g/dL (ref 3.5–5.2)
Alkaline Phosphatase: 96 U/L (ref 39–117)
Indirect Bilirubin: 0.3 mg/dL (ref 0.0–0.9)
Total Protein: 6.9 g/dL (ref 6.0–8.3)

## 2011-04-30 MED ORDER — ONDANSETRON HCL 4 MG PO TABS: 4.0000 mg | ORAL_TABLET | Freq: Three times a day (TID) | ORAL | Status: DC | PRN

## 2011-04-30 NOTE — Telephone Encounter (Signed)
Spoke w/ pt No vomiting. Persistent nausea.  No headache or dizziness. On nexium 40mg  BID Will send Zofran to [facility]. Refuses to set up colonoscopy until October for rectal bleeding/hx adenomatous polyps Offered GES for nausea, but pt would like to hold off for now. Will call if she changes her mind.

## 2011-04-30 NOTE — Assessment & Plan Note (Signed)
Large volume bright red hematochezia most likely secondary to external hemorrhoid. Cannot rule out diverticular bleed or ischemia.  Also, given history of multiple adenomatous polyps, I offered colonoscopy now instead of waiting until October when she is due. She prefers to wait.  Check CBC. Anusol HC suppositories twice a day for 10 days

## 2011-04-30 NOTE — Progress Notes (Signed)
Faxed to PCP

## 2011-04-30 NOTE — Progress Notes (Signed)
Quick Note:  Please let pt know her blood count & liver tests are normal. How is she feeling today? ZO:[id number] L, MD   ______

## 2011-04-30 NOTE — Assessment & Plan Note (Addendum)
Persistent right upper quadrant pain.  Gastric erosions/ulceration on EGD.  Recent HIDA normal. Recent CT scan normal.  Chronic nausea.  Increase Nexium to 40 mg twice a day Check LFTs

## 2011-04-30 NOTE — Assessment & Plan Note (Signed)
Clear liquid diet, advance to brat as tolerated

## 2011-05-18 DIAGNOSIS — M545 Low back pain: Secondary | ICD-10-CM | POA: Diagnosis not present

## 2011-05-18 DIAGNOSIS — Z79899 Other long term (current) drug therapy: Secondary | ICD-10-CM | POA: Diagnosis not present

## 2011-05-20 DIAGNOSIS — J449 Chronic obstructive pulmonary disease, unspecified: Secondary | ICD-10-CM | POA: Diagnosis not present

## 2011-05-20 DIAGNOSIS — R1084 Generalized abdominal pain: Secondary | ICD-10-CM | POA: Diagnosis not present

## 2011-06-08 DIAGNOSIS — N390 Urinary tract infection, site not specified: Secondary | ICD-10-CM | POA: Diagnosis not present

## 2011-08-10 DIAGNOSIS — M545 Low back pain: Secondary | ICD-10-CM | POA: Diagnosis not present

## 2011-09-13 DIAGNOSIS — J449 Chronic obstructive pulmonary disease, unspecified: Secondary | ICD-10-CM | POA: Diagnosis not present

## 2011-09-13 DIAGNOSIS — F411 Generalized anxiety disorder: Secondary | ICD-10-CM | POA: Diagnosis not present

## 2011-09-13 DIAGNOSIS — F329 Major depressive disorder, single episode, unspecified: Secondary | ICD-10-CM | POA: Diagnosis not present

## 2011-10-11 DIAGNOSIS — R21 Rash and other nonspecific skin eruption: Secondary | ICD-10-CM | POA: Diagnosis not present

## 2011-10-11 DIAGNOSIS — N390 Urinary tract infection, site not specified: Secondary | ICD-10-CM | POA: Diagnosis not present

## 2011-10-11 DIAGNOSIS — Z23 Encounter for immunization: Secondary | ICD-10-CM | POA: Diagnosis not present

## 2011-10-11 DIAGNOSIS — J449 Chronic obstructive pulmonary disease, unspecified: Secondary | ICD-10-CM | POA: Diagnosis not present

## 2011-10-11 DIAGNOSIS — J8410 Pulmonary fibrosis, unspecified: Secondary | ICD-10-CM | POA: Diagnosis not present

## 2011-10-19 ENCOUNTER — Encounter: Payer: Self-pay | Admitting: Internal Medicine

## 2011-12-08 DIAGNOSIS — J449 Chronic obstructive pulmonary disease, unspecified: Secondary | ICD-10-CM | POA: Diagnosis not present

## 2011-12-08 DIAGNOSIS — G4700 Insomnia, unspecified: Secondary | ICD-10-CM | POA: Diagnosis not present

## 2011-12-08 DIAGNOSIS — F411 Generalized anxiety disorder: Secondary | ICD-10-CM | POA: Diagnosis not present

## 2011-12-08 DIAGNOSIS — N318 Other neuromuscular dysfunction of bladder: Secondary | ICD-10-CM | POA: Diagnosis not present

## 2012-03-01 ENCOUNTER — Emergency Department (HOSPITAL_COMMUNITY): Payer: Medicare Other

## 2012-03-01 ENCOUNTER — Encounter (HOSPITAL_COMMUNITY): Payer: Self-pay | Admitting: Emergency Medicine

## 2012-03-01 ENCOUNTER — Emergency Department (HOSPITAL_COMMUNITY)
Admission: EM | Admit: 2012-03-01 | Discharge: 2012-03-01 | Disposition: A | Discharge status: Home / Self Care | Payer: Medicare Other | Attending: Emergency Medicine | Admitting: Emergency Medicine

## 2012-03-01 DIAGNOSIS — E7800 Pure hypercholesterolemia, unspecified: Secondary | ICD-10-CM | POA: Insufficient documentation

## 2012-03-01 DIAGNOSIS — R1084 Generalized abdominal pain: Secondary | ICD-10-CM | POA: Diagnosis not present

## 2012-03-01 DIAGNOSIS — F411 Generalized anxiety disorder: Secondary | ICD-10-CM | POA: Insufficient documentation

## 2012-03-01 DIAGNOSIS — Z8739 Personal history of other diseases of the musculoskeletal system and connective tissue: Secondary | ICD-10-CM | POA: Diagnosis not present

## 2012-03-01 DIAGNOSIS — Z79899 Other long term (current) drug therapy: Secondary | ICD-10-CM | POA: Insufficient documentation

## 2012-03-01 DIAGNOSIS — N390 Urinary tract infection, site not specified: Secondary | ICD-10-CM | POA: Diagnosis not present

## 2012-03-01 DIAGNOSIS — K219 Gastro-esophageal reflux disease without esophagitis: Secondary | ICD-10-CM | POA: Insufficient documentation

## 2012-03-01 DIAGNOSIS — R1012 Left upper quadrant pain: Secondary | ICD-10-CM | POA: Diagnosis not present

## 2012-03-01 DIAGNOSIS — R1013 Epigastric pain: Secondary | ICD-10-CM | POA: Diagnosis not present

## 2012-03-01 LAB — COMPREHENSIVE METABOLIC PANEL
Albumin: 3.8 g/dL (ref 3.5–5.2)
BUN: 8 mg/dL (ref 6–23)
Creatinine, Ser: 0.72 mg/dL (ref 0.50–1.10)
Potassium: 3.6 mEq/L (ref 3.5–5.1)
Total Protein: 7 g/dL (ref 6.0–8.3)

## 2012-03-01 LAB — URINE MICROSCOPIC-ADD ON

## 2012-03-01 LAB — URINALYSIS, ROUTINE W REFLEX MICROSCOPIC
Bilirubin Urine: NEGATIVE
Hgb urine dipstick: NEGATIVE
Nitrite: NEGATIVE
Specific Gravity, Urine: 1.015 (ref 1.005–1.030)
pH: 6 (ref 5.0–8.0)

## 2012-03-01 LAB — CBC WITH DIFFERENTIAL/PLATELET
Eosinophils Relative: 1 % (ref 0–5)
HCT: 35.2 % — ABNORMAL LOW (ref 36.0–46.0)
Lymphocytes Relative: 19 % (ref 12–46)
Lymphs Abs: 1.2 10*3/uL (ref 0.7–4.0)
MCV: 86.7 fL (ref 78.0–100.0)
Monocytes Absolute: 1 10*3/uL (ref 0.1–1.0)
Monocytes Relative: 16 % — ABNORMAL HIGH (ref 3–12)
RBC: 4.06 MIL/uL (ref 3.87–5.11)
WBC: 6.3 10*3/uL (ref 4.0–10.5)

## 2012-03-01 MED ORDER — DEXTROSE 5 % IV SOLN
1.0000 g | Freq: Once | INTRAVENOUS | Status: AC
  Administered 2012-03-01: 1 g via INTRAVENOUS
  Filled 2012-03-01: qty 10

## 2012-03-01 MED ORDER — PANTOPRAZOLE SODIUM 40 MG IV SOLR
40.0000 mg | Freq: Once | INTRAVENOUS | Status: AC
  Administered 2012-03-01: 40 mg via INTRAVENOUS
  Filled 2012-03-01: qty 40

## 2012-03-01 MED ORDER — NITROFURANTOIN MONOHYD MACRO 100 MG PO CAPS: 100.0000 mg | ORAL_CAPSULE | Freq: Two times a day (BID) | ORAL | Status: DC

## 2012-03-01 MED ORDER — SODIUM CHLORIDE 0.9 % IV BOLUS (SEPSIS)
1000.0000 mL | Freq: Once | INTRAVENOUS | Status: AC
  Administered 2012-03-01: 1000 mL via INTRAVENOUS

## 2012-03-01 MED ORDER — ONDANSETRON HCL 4 MG/2ML IJ SOLN
4.0000 mg | Freq: Once | INTRAMUSCULAR | Status: AC
  Administered 2012-03-01: 4 mg via INTRAVENOUS
  Filled 2012-03-01: qty 2

## 2012-03-01 MED ORDER — FAMOTIDINE 20 MG PO TABS: 20.0000 mg | ORAL_TABLET | Freq: Two times a day (BID) | ORAL | Status: DC

## 2012-03-01 MED ORDER — FENTANYL CITRATE 0.05 MG/ML IJ SOLN
50.0000 ug | Freq: Once | INTRAMUSCULAR | Status: AC
  Administered 2012-03-01: 50 ug via INTRAVENOUS
  Filled 2012-03-01: qty 2

## 2012-03-01 NOTE — ED Provider Notes (Signed)
History     CSN: [id number]  Arrival date & time 03/01/12  1916   First MD Initiated Contact with Patient 03/01/12 1924      Chief Complaint  Patient presents with  . Abdominal Pain    (Consider location/radiation/quality/duration/timing/severity/associated sxs/prior treatment) HPI....epigastric pain with chills since last evening. No substernal chest pain, dyspnea, dysuria.  Symptoms are moderate. Nothing makes symptoms better or worse. She has been eating and urinating. She is ambulatory.  Past Medical History  Diagnosis Date  . High cholesterol   . Osteoporosis   . Acid reflux   . Anxiety     Past Surgical History  Procedure Laterality Date  . Colonoscopy  10/2009    sigmoid diverticula, multiple tubulovillous adneomas, needs surveillance Oct 2013  . Esophagogastroduodenoscopy  04/16/2011    Procedure: ESOPHAGOGASTRODUODENOSCOPY (EGD);  Surgeon: Corbin Ade, MD;  Location: [facility];  Service: Endoscopy;  Laterality: N/A;  9:30    Family History  Problem Relation Age of Onset  . Colon cancer Neg Hx     History  Substance Use Topics  . Smoking status: Never Smoker   . Smokeless tobacco: Not on file  . Alcohol Use: No    OB History   Grav Para Term Preterm Abortions TAB SAB Ect Mult Living                  Review of Systems  All other systems reviewed and are negative.    Allergies  Codeine; Levofloxacin; and Sulfonamide derivatives  Home Medications   Current Outpatient Rx  Name  Route  Sig  Dispense  Refill  . ALPRAZolam (XANAX) 0.5 MG tablet   Oral   Take 0.5 mg by mouth at bedtime as needed.          . cetirizine (ZYRTEC) 10 MG tablet   Oral   Take 10 mg by mouth daily.         . diazepam (VALIUM) 2 MG tablet   Oral   Take 2 mg by mouth Once daily as needed for anxiety. For anxiety         . esomeprazole (NEXIUM) 40 MG capsule   Oral   Take 40 mg by mouth daily before breakfast.         . HYDROcodone-acetaminophen  (VICODIN) 5-500 MG per tablet   Oral   Take 1 tablet by mouth every 6 (six) hours as needed. For Pain         . promethazine (PHENERGAN) 25 MG tablet   Oral   Take 25 mg by mouth Once daily as needed. For nausea         . simvastatin (ZOCOR) 40 MG tablet   Oral   Take 40 mg by mouth every evening.         . traMADol (ULTRAM) 50 MG tablet   Oral   Take 50 mg by mouth every 6 (six) hours as needed. For pain         . zolpidem (AMBIEN) 10 MG tablet   Oral   Take 5 mg by mouth at bedtime as needed.          . famotidine (PEPCID) 20 MG tablet   Oral   Take 1 tablet (20 mg total) by mouth 2 (two) times daily.   14 tablet   0   . nitrofurantoin, macrocrystal-monohydrate, (MACROBID) 100 MG capsule   Oral   Take 1 capsule (100 mg total) by mouth 2 (two) times daily.  X 7 days   14 capsule   0     BP 113/87  Pulse 79  Temp(Src) 97.5 F (36.4 C) (Oral)  Resp 18  Ht 5\' 5"  (1.651 m)  Wt 115 lb (52.164 kg)  BMI 19.14 kg/m2  SpO2 99%  Physical Exam  Nursing note and vitals reviewed. Constitutional: She is oriented to person, place, and time. She appears well-developed and well-nourished.  HENT:  Head: Normocephalic and atraumatic.  Eyes: Conjunctivae and EOM are normal. Pupils are equal, round, and reactive to light.  Neck: Normal range of motion. Neck supple.  Cardiovascular: Normal rate, regular rhythm and normal heart sounds.   Pulmonary/Chest: Effort normal and breath sounds normal.  Abdominal:  Slight epigastric tenderness  Musculoskeletal: Normal range of motion.  Neurological: She is alert and oriented to person, place, and time.  Skin: Skin is warm and dry.  Psychiatric: She has a normal mood and affect.    ED Course  Procedures (including critical care time)  Labs Reviewed  COMPREHENSIVE METABOLIC PANEL - Abnormal; Notable for the following:    GFR calc non Af Amer 82 (*)    All other components within normal limits  CBC WITH DIFFERENTIAL -  Abnormal; Notable for the following:    Hemoglobin 11.6 (*)    HCT 35.2 (*)    Monocytes Relative 16 (*)    All other components within normal limits  URINALYSIS, ROUTINE W REFLEX MICROSCOPIC - Abnormal; Notable for the following:    APPearance CLOUDY (*)    Leukocytes, UA LARGE (*)    All other components within normal limits  URINE MICROSCOPIC-ADD ON - Abnormal; Notable for the following:    Squamous Epithelial / LPF FEW (*)    Bacteria, UA MANY (*)    All other components within normal limits  URINE CULTURE  LIPASE, BLOOD   Dg Abd Acute W/chest  03/01/2012  *RADIOLOGY REPORT*  Clinical Data: Left upper abdominal pain.  ACUTE ABDOMEN SERIES (ABDOMEN 2 VIEW & CHEST 1 VIEW)  Comparison: Chest x-ray 03/05/2011.  Abdominal images 01/19/2011.  Findings: Large hiatal hernia.  Mild hyperinflation of the lungs. No confluent opacities or effusions.  Heart is normal size.  Moderate stool within the right side of the colon.  Nonobstructive bowel gas pattern.  No organomegaly.  No suspicious calcification. No acute bony abnormality.  IMPRESSION: No evidence of bowel obstruction or free air.  Moderate stool burden in the right colon.  Large hiatal hernia.  COPD.   Original Report Authenticated By: Charlett Nose, M.D.      1. Urinary tract infection       MDM  Patient's clinical presentation is nontoxic. She is alert and oriented x3. Workup reveals a significant urinary tract infection. IV Rocephin given in emergency department. Urine culture. Discharge home on Macrobid and Pepcid for epigastric discomfort        Donnetta Hutching, MD 03/03/12 1416

## 2012-03-01 NOTE — ED Notes (Signed)
Pt c/o upper left gi pain with nausea and denies any vomiting or diarrhea. Pt states the pain is a burning pain.

## 2012-03-03 DIAGNOSIS — R112 Nausea with vomiting, unspecified: Secondary | ICD-10-CM | POA: Diagnosis not present

## 2012-03-03 DIAGNOSIS — J449 Chronic obstructive pulmonary disease, unspecified: Secondary | ICD-10-CM | POA: Diagnosis not present

## 2012-03-03 DIAGNOSIS — N390 Urinary tract infection, site not specified: Secondary | ICD-10-CM | POA: Diagnosis not present

## 2012-03-03 DIAGNOSIS — F411 Generalized anxiety disorder: Secondary | ICD-10-CM | POA: Diagnosis not present

## 2012-03-04 LAB — URINE CULTURE: Colony Count: >=100000

## 2012-03-05 ENCOUNTER — Telehealth (HOSPITAL_COMMUNITY): Payer: Self-pay | Admitting: Emergency Medicine

## 2012-03-05 NOTE — ED Notes (Signed)
+  Urine. Patient treated with Macrobid. Sensitive to same. Per protocol MD. °

## 2012-03-05 NOTE — ED Notes (Signed)
Patient has +Urine culture. Checking to see if appropriately treated. °

## 2012-03-16 DIAGNOSIS — N390 Urinary tract infection, site not specified: Secondary | ICD-10-CM | POA: Diagnosis not present

## 2012-05-12 DIAGNOSIS — J449 Chronic obstructive pulmonary disease, unspecified: Secondary | ICD-10-CM | POA: Diagnosis not present

## 2012-05-12 DIAGNOSIS — N390 Urinary tract infection, site not specified: Secondary | ICD-10-CM | POA: Diagnosis not present

## 2012-05-12 DIAGNOSIS — F411 Generalized anxiety disorder: Secondary | ICD-10-CM | POA: Diagnosis not present

## 2012-09-25 ENCOUNTER — Encounter: Payer: Self-pay | Admitting: Pulmonary Disease

## 2012-09-25 DIAGNOSIS — R634 Abnormal weight loss: Secondary | ICD-10-CM | POA: Diagnosis not present

## 2012-09-25 DIAGNOSIS — Z23 Encounter for immunization: Secondary | ICD-10-CM | POA: Diagnosis not present

## 2012-09-25 DIAGNOSIS — N318 Other neuromuscular dysfunction of bladder: Secondary | ICD-10-CM | POA: Diagnosis not present

## 2012-09-25 DIAGNOSIS — J449 Chronic obstructive pulmonary disease, unspecified: Secondary | ICD-10-CM | POA: Diagnosis not present

## 2012-09-25 DIAGNOSIS — F411 Generalized anxiety disorder: Secondary | ICD-10-CM | POA: Diagnosis not present

## 2012-09-25 DIAGNOSIS — E785 Hyperlipidemia, unspecified: Secondary | ICD-10-CM | POA: Diagnosis not present

## 2012-09-25 DIAGNOSIS — J4489 Other specified chronic obstructive pulmonary disease: Secondary | ICD-10-CM | POA: Diagnosis not present

## 2012-09-28 DIAGNOSIS — E876 Hypokalemia: Secondary | ICD-10-CM | POA: Diagnosis not present

## 2012-09-28 DIAGNOSIS — R635 Abnormal weight gain: Secondary | ICD-10-CM | POA: Diagnosis not present

## 2012-09-28 DIAGNOSIS — Z79899 Other long term (current) drug therapy: Secondary | ICD-10-CM | POA: Diagnosis not present

## 2012-12-21 DIAGNOSIS — J449 Chronic obstructive pulmonary disease, unspecified: Secondary | ICD-10-CM | POA: Diagnosis not present

## 2012-12-21 DIAGNOSIS — N318 Other neuromuscular dysfunction of bladder: Secondary | ICD-10-CM | POA: Diagnosis not present

## 2012-12-27 DIAGNOSIS — N318 Other neuromuscular dysfunction of bladder: Secondary | ICD-10-CM | POA: Diagnosis not present

## 2012-12-27 DIAGNOSIS — J449 Chronic obstructive pulmonary disease, unspecified: Secondary | ICD-10-CM | POA: Diagnosis not present

## 2012-12-27 DIAGNOSIS — R109 Unspecified abdominal pain: Secondary | ICD-10-CM | POA: Diagnosis not present

## 2013-03-22 DIAGNOSIS — N318 Other neuromuscular dysfunction of bladder: Secondary | ICD-10-CM | POA: Diagnosis not present

## 2013-03-22 DIAGNOSIS — F411 Generalized anxiety disorder: Secondary | ICD-10-CM | POA: Diagnosis not present

## 2013-03-22 DIAGNOSIS — K2100 Gastro-esophageal reflux disease with esophagitis, without bleeding: Secondary | ICD-10-CM | POA: Diagnosis not present

## 2013-03-22 DIAGNOSIS — J8410 Pulmonary fibrosis, unspecified: Secondary | ICD-10-CM | POA: Diagnosis not present

## 2013-06-07 DIAGNOSIS — J449 Chronic obstructive pulmonary disease, unspecified: Secondary | ICD-10-CM | POA: Diagnosis not present

## 2013-06-07 DIAGNOSIS — Z79899 Other long term (current) drug therapy: Secondary | ICD-10-CM | POA: Diagnosis not present

## 2013-06-07 DIAGNOSIS — K223 Perforation of esophagus: Secondary | ICD-10-CM | POA: Diagnosis not present

## 2013-06-07 DIAGNOSIS — E785 Hyperlipidemia, unspecified: Secondary | ICD-10-CM | POA: Diagnosis not present

## 2013-06-07 DIAGNOSIS — Z5181 Encounter for therapeutic drug level monitoring: Secondary | ICD-10-CM | POA: Diagnosis not present

## 2013-06-11 ENCOUNTER — Encounter: Payer: Self-pay | Admitting: Pulmonary Disease

## 2013-06-25 DIAGNOSIS — K2100 Gastro-esophageal reflux disease with esophagitis, without bleeding: Secondary | ICD-10-CM | POA: Diagnosis not present

## 2013-06-25 DIAGNOSIS — M545 Low back pain, unspecified: Secondary | ICD-10-CM | POA: Diagnosis not present

## 2013-06-25 DIAGNOSIS — J8410 Pulmonary fibrosis, unspecified: Secondary | ICD-10-CM | POA: Diagnosis not present

## 2013-06-25 DIAGNOSIS — J449 Chronic obstructive pulmonary disease, unspecified: Secondary | ICD-10-CM | POA: Diagnosis not present

## 2013-07-04 DIAGNOSIS — K2100 Gastro-esophageal reflux disease with esophagitis, without bleeding: Secondary | ICD-10-CM | POA: Diagnosis not present

## 2013-07-04 DIAGNOSIS — Z5181 Encounter for therapeutic drug level monitoring: Secondary | ICD-10-CM | POA: Diagnosis not present

## 2013-07-04 DIAGNOSIS — E785 Hyperlipidemia, unspecified: Secondary | ICD-10-CM | POA: Diagnosis not present

## 2013-07-04 DIAGNOSIS — J449 Chronic obstructive pulmonary disease, unspecified: Secondary | ICD-10-CM | POA: Diagnosis not present

## 2013-07-09 ENCOUNTER — Encounter: Payer: Self-pay | Admitting: Pulmonary Disease

## 2013-10-07 ENCOUNTER — Emergency Department (HOSPITAL_COMMUNITY)
Admission: EM | Admit: 2013-10-07 | Discharge: 2013-10-07 | Disposition: A | Discharge status: Home / Self Care | Payer: Medicare Other | Attending: Emergency Medicine | Admitting: Emergency Medicine

## 2013-10-07 ENCOUNTER — Encounter (HOSPITAL_COMMUNITY): Payer: Self-pay | Admitting: Emergency Medicine

## 2013-10-07 DIAGNOSIS — F419 Anxiety disorder, unspecified: Secondary | ICD-10-CM | POA: Diagnosis not present

## 2013-10-07 DIAGNOSIS — H109 Unspecified conjunctivitis: Secondary | ICD-10-CM | POA: Diagnosis not present

## 2013-10-07 DIAGNOSIS — E780 Pure hypercholesterolemia: Secondary | ICD-10-CM | POA: Diagnosis not present

## 2013-10-07 DIAGNOSIS — K219 Gastro-esophageal reflux disease without esophagitis: Secondary | ICD-10-CM | POA: Diagnosis not present

## 2013-10-07 DIAGNOSIS — J449 Chronic obstructive pulmonary disease, unspecified: Secondary | ICD-10-CM | POA: Diagnosis not present

## 2013-10-07 DIAGNOSIS — L299 Pruritus, unspecified: Secondary | ICD-10-CM | POA: Diagnosis not present

## 2013-10-07 DIAGNOSIS — Z79899 Other long term (current) drug therapy: Secondary | ICD-10-CM | POA: Insufficient documentation

## 2013-10-07 DIAGNOSIS — H5712 Ocular pain, left eye: Secondary | ICD-10-CM | POA: Diagnosis present

## 2013-10-07 HISTORY — DX: Chronic obstructive pulmonary disease, unspecified: J44.9

## 2013-10-07 MED ORDER — ERYTHROMYCIN 5 MG/GM OP OINT
TOPICAL_OINTMENT | OPHTHALMIC | Status: AC
  Administered 2013-10-07: 07:00:00
  Filled 2013-10-07: qty 3.5

## 2013-10-07 MED ORDER — FLUORESCEIN SODIUM 1 MG OP STRP
ORAL_STRIP | OPHTHALMIC | Status: AC
  Administered 2013-10-07: 07:00:00
  Filled 2013-10-07: qty 1

## 2013-10-07 MED ORDER — TETRACAINE HCL 0.5 % OP SOLN
OPHTHALMIC | Status: AC
Start: 2013-10-07 — End: 2013-10-07
  Administered 2013-10-07: 07:00:00
  Filled 2013-10-07: qty 2

## 2013-10-07 NOTE — Discharge Instructions (Signed)

## 2013-10-07 NOTE — ED Notes (Signed)
Pt c/o pain, burning, and itching to both eyes,  Also has noted drainage from eyes worse at night

## 2013-10-07 NOTE — ED Provider Notes (Signed)
CSN: [id number]     Arrival date & time 10/07/13  0617 History   First MD Initiated Contact with Patient 10/07/13 [phone]     Chief Complaint  Patient presents with  . Eye Pain      Patient is a 79 y.o. female presenting with eye pain. The history is provided by the patient.  Eye Pain This is a new problem. The current episode started 2 days ago. The problem occurs daily. The problem has been gradually worsening. Nothing relieves the symptoms.  pt reports mild left eye pain, drainage and itching No trauma to eye No change in vision is reported She does not wear glasses or contacts lenses She denies any foreign body in her eye  Past Medical History  Diagnosis Date  . High cholesterol   . Osteoporosis   . Acid reflux   . Anxiety   . COPD (chronic obstructive pulmonary disease)    Past Surgical History  Procedure Laterality Date  . Colonoscopy  10/2009    sigmoid diverticula, multiple tubulovillous adneomas, needs surveillance Oct 2013  . Esophagogastroduodenoscopy  04/16/2011    Procedure: ESOPHAGOGASTRODUODENOSCOPY (EGD);  Surgeon: Daneil Dolin, MD;  Location: [facility];  Service: Endoscopy;  Laterality: N/A;  9:30   Family History  Problem Relation Age of Onset  . Colon cancer Neg Hx    History  Substance Use Topics  . Smoking status: Never Smoker   . Smokeless tobacco: Not on file  . Alcohol Use: No   OB History   Grav Para Term Preterm Abortions TAB SAB Ect Mult Living                 Review of Systems  Constitutional: Negative for fever.  Eyes: Positive for pain, discharge, redness and itching.      Allergies  Codeine; Levofloxacin; and Sulfonamide derivatives  Home Medications   Prior to Admission medications   Medication Sig Start Date End Date Taking? Authorizing Provider  ALPRAZolam Duanne Moron) 0.5 MG tablet Take 0.5 mg by mouth at bedtime as needed.  03/17/11  Yes Historical Provider, MD  cetirizine (ZYRTEC) 10 MG tablet Take 10 mg by mouth daily.    Yes Historical Provider, MD  esomeprazole (NEXIUM) 40 MG capsule Take 40 mg by mouth daily before breakfast.   Yes Historical Provider, MD  HYDROcodone-acetaminophen (VICODIN) 5-500 MG per tablet Take 1 tablet by mouth every 6 (six) hours as needed. For Pain   Yes Historical Provider, MD  promethazine (PHENERGAN) 25 MG tablet Take 25 mg by mouth Once daily as needed. For nausea 01/19/11  Yes Historical Provider, MD  simvastatin (ZOCOR) 40 MG tablet Take 40 mg by mouth every evening.   Yes Historical Provider, MD  traMADol (ULTRAM) 50 MG tablet Take 50 mg by mouth every 6 (six) hours as needed. For pain   Yes Historical Provider, MD  zolpidem (AMBIEN) 10 MG tablet Take 5 mg by mouth at bedtime as needed.  03/10/11  Yes Historical Provider, MD   BP 141/93  Pulse 77  Temp(Src) 98.5 F (36.9 C) (Oral)  Resp 16  SpO2 99% Physical Exam CONSTITUTIONAL: Well developed/well nourished HEAD: Normocephalic/atraumatic EYES: EOMI/PERRL. OS - mild conjunctival erythema.  No foreign body noted.  No corneal abrasion.  No corneal hazing.  No proptosis.  There is no periorbital edema.  No significant discharge noted ENMT: Mucous membranes moist, poor dentition NECK: supple no meningeal signs ABDOMEN: soft, nontender, no rebound or guarding NEURO: Pt is awake/alert, moves all  extremitiesx4 SKIN: warm, color normal PSYCH: no abnormalities of mood noted  ED Course  Procedures suspect mild conjunctivitis She is well appearing, no distress, denies any visual disturbance (though I advised her to f/u for eye checkup due to visual acuity)  MDM   Final diagnoses:  Conjunctivitis of left eye    Nursing notes including past medical history and social history reviewed and considered in documentation     Sharyon Cable, MD 10/07/13 [phone]

## 2013-10-08 DIAGNOSIS — M5440 Lumbago with sciatica, unspecified side: Secondary | ICD-10-CM | POA: Diagnosis not present

## 2013-10-08 DIAGNOSIS — Z23 Encounter for immunization: Secondary | ICD-10-CM | POA: Diagnosis not present

## 2013-10-08 DIAGNOSIS — J8410 Pulmonary fibrosis, unspecified: Secondary | ICD-10-CM | POA: Diagnosis not present

## 2013-10-08 DIAGNOSIS — J449 Chronic obstructive pulmonary disease, unspecified: Secondary | ICD-10-CM | POA: Diagnosis not present

## 2013-10-08 DIAGNOSIS — H1032 Unspecified acute conjunctivitis, left eye: Secondary | ICD-10-CM | POA: Diagnosis not present

## 2013-11-12 DIAGNOSIS — J449 Chronic obstructive pulmonary disease, unspecified: Secondary | ICD-10-CM | POA: Diagnosis not present

## 2013-11-12 DIAGNOSIS — I10 Essential (primary) hypertension: Secondary | ICD-10-CM | POA: Diagnosis not present

## 2013-11-12 DIAGNOSIS — J209 Acute bronchitis, unspecified: Secondary | ICD-10-CM | POA: Diagnosis not present

## 2013-11-15 ENCOUNTER — Other Ambulatory Visit (HOSPITAL_COMMUNITY): Payer: Self-pay | Admitting: Pulmonary Disease

## 2013-11-15 ENCOUNTER — Ambulatory Visit (HOSPITAL_COMMUNITY)
Admission: RE | Admit: 2013-11-15 | Discharge: 2013-11-15 | Disposition: A | Discharge status: Home / Self Care | Payer: Medicare Other | Source: Ambulatory Visit | Attending: Pulmonary Disease | Admitting: Pulmonary Disease

## 2013-11-15 DIAGNOSIS — R0602 Shortness of breath: Secondary | ICD-10-CM

## 2013-11-15 DIAGNOSIS — J449 Chronic obstructive pulmonary disease, unspecified: Secondary | ICD-10-CM | POA: Diagnosis not present

## 2013-11-15 DIAGNOSIS — R05 Cough: Secondary | ICD-10-CM | POA: Diagnosis present

## 2014-01-08 DIAGNOSIS — J8410 Pulmonary fibrosis, unspecified: Secondary | ICD-10-CM | POA: Diagnosis not present

## 2014-01-08 DIAGNOSIS — M5440 Lumbago with sciatica, unspecified side: Secondary | ICD-10-CM | POA: Diagnosis not present

## 2014-01-08 DIAGNOSIS — K219 Gastro-esophageal reflux disease without esophagitis: Secondary | ICD-10-CM | POA: Diagnosis not present

## 2014-01-08 DIAGNOSIS — J449 Chronic obstructive pulmonary disease, unspecified: Secondary | ICD-10-CM | POA: Diagnosis not present

## 2014-03-14 ENCOUNTER — Encounter: Payer: Self-pay | Admitting: Internal Medicine

## 2014-04-04 ENCOUNTER — Ambulatory Visit (INDEPENDENT_AMBULATORY_CARE_PROVIDER_SITE_OTHER): Payer: Medicare Other | Admitting: Gastroenterology

## 2014-04-04 ENCOUNTER — Other Ambulatory Visit: Payer: Self-pay

## 2014-04-04 ENCOUNTER — Encounter: Payer: Self-pay | Admitting: Gastroenterology

## 2014-04-04 VITALS — BP 146/88 | HR 79 | Temp 97.0°F | Ht 63.0 in | Wt 121.4 lb

## 2014-04-04 DIAGNOSIS — D369 Benign neoplasm, unspecified site: Secondary | ICD-10-CM | POA: Diagnosis not present

## 2014-04-04 DIAGNOSIS — R1013 Epigastric pain: Secondary | ICD-10-CM | POA: Diagnosis not present

## 2014-04-04 MED ORDER — OMEPRAZOLE 20 MG PO CPDR: 20.0000 mg | DELAYED_RELEASE_CAPSULE | Freq: Every day | ORAL | Status: DC

## 2014-04-04 NOTE — Patient Instructions (Signed)
Your insurance doesn't cover Protonix, so I have placed you on Prilosec once each morning, 30 minutes before breakfast. Stop Nexium.  We have scheduled you for an upper endoscopy with Dr. Gala Romney as soon as possible. I would like to see you back in about 6-8 weeks.   It is very important that you have a colonoscopy as soon as you can. You had pre-cancerous polyps during the last exam, and we need to make sure that we reevaluate your colon.

## 2014-04-04 NOTE — Progress Notes (Signed)
Primary Care Physician:  Alonza Bogus, MD Primary Gastroenterologist:  Dr. Gala Romney   Chief Complaint  Patient presents with  . Gastrophageal Reflux    HPI:   [name] is a 80 y.o. female presenting today at the request of Dr. Luan Pulling secondary to uncontrolled GERD. Last EGD in 2013 with non-critical Schatzki's ring, non-manipulated, large hiatal hernia, gastric polyp and erosions with path showing ulcerated gastric antral mucosa with foveolar hyperplasia, stomach polyp with foveolar hyperplasia and xanthoma. Colonoscopy last in 2011 with multiple tubulovillous adenomas. Overdue for surveillance. SHE IS NOT INTERESTED IN A COLONOSCOPY AT THIS TIME DESPITE THE RISKS AND BENEFITS DISCUSSED IN DETAIL.   Notes epigastric discomfort/burning. Intermittent. Sometimes flares with food, sometimes not. Nexium daily. No other medications tried. No dysphagia. Feels nauseated but no vomiting. Phenergan prn. Associated weight loss. 139 in April 2013, now 121. Not much of an appetite. "stressed". Some days diarrhea, some days normal. No rectal bleeding. Gallbladder remains in situ. No NSAIDs or aspirin powders. Only tylenol products. Pain for about a month.   Past Medical History  Diagnosis Date  . High cholesterol   . Osteoporosis   . Acid reflux   . Anxiety   . COPD (chronic obstructive pulmonary disease)     Past Surgical History  Procedure Laterality Date  . Colonoscopy  10/2009    sigmoid diverticula, multiple tubulovillous adneomas, needs surveillance Oct 2013  . Esophagogastroduodenoscopy  04/16/2011    Dr. Gala Romney: Noncritical Schatzkis ring( not manipulated because no dysphagia). Normal esophagus otherwise  large hiatal hernia. Gastric polyp-status post biopsy. Gastric erosions-staus post biopsy. Abnormal bulb-status post biopsy. benign small bowel, stomach biopsy with ulcerated gastric antral mucosa with foveolar hyperplasia and surface erosion, polyp with inflamed gastric antral  mucosa     Current Outpatient Prescriptions  Medication Sig Dispense Refill  . ALPRAZolam (XANAX) 0.5 MG tablet Take 0.5 mg by mouth at bedtime as needed.     . cetirizine (ZYRTEC) 10 MG tablet Take 10 mg by mouth daily.    Marland Kitchen esomeprazole (NEXIUM) 40 MG capsule Take 40 mg by mouth daily before breakfast.    . HYDROcodone-acetaminophen (VICODIN) 5-500 MG per tablet Take 1 tablet by mouth every 6 (six) hours as needed. For Pain    . promethazine (PHENERGAN) 25 MG tablet Take 25 mg by mouth Once daily as needed. For nausea    . traMADol (ULTRAM) 50 MG tablet Take 50 mg by mouth every 6 (six) hours as needed. For pain    . zolpidem (AMBIEN) 10 MG tablet Take 5 mg by mouth at bedtime as needed.     Marland Kitchen omeprazole (PRILOSEC) 20 MG capsule Take 1 capsule (20 mg total) by mouth daily. Take 30 minutes before breakfast daily. 30 capsule 3   No current facility-administered medications for this visit.    Allergies as of 04/04/2014 - Review Complete 04/04/2014  Allergen Reaction Noted  . Codeine Shortness Of Breath and Rash   . Levofloxacin Anaphylaxis 03/05/2011  . Sulfonamide derivatives Hives and Shortness Of Breath     Family History  Problem Relation Age of Onset  . Colon cancer Neg Hx     History   Social History  . Marital Status: Married    Spouse Name: N/A  . Number of Children: N/A  . Years of Education: N/A   Occupational History  . Not on file.   Social History Main Topics  . Smoking status: Never Smoker   . Smokeless tobacco:  Not on file  . Alcohol Use: No  . Drug Use: No  . Sexual Activity: Not on file   Other Topics Concern  . Not on file   Social History Narrative    Review of Systems: Gen: Denies any fever, chills, fatigue, weight loss, lack of appetite.  CV: Denies chest pain, heart palpitations, peripheral edema, syncope.  Resp: +DOE GI: see HPI GU : Denies urinary burning, urinary frequency, urinary hesitancy MS: Denies joint pain, muscle weakness,  cramps, or limitation of movement.  Derm: Denies rash, itching, dry skin Psych: +stress Heme: Denies bruising, bleeding, and enlarged lymph nodes.  Physical Exam: BP 146/88 mmHg  Pulse 79  Temp(Src) 97 F (36.1 C)  Ht 5\' 3"  (1.6 m)  Wt 121 lb 6.4 oz (55.067 kg)  BMI 21.51 kg/m2 General:   Alert and oriented. Pleasant and cooperative. Well-nourished and well-developed.  Head:  Normocephalic and atraumatic. Eyes:  Without icterus, sclera clear and conjunctiva pink.  Ears:  Normal auditory acuity. Nose:  No deformity, discharge,  or lesions. Mouth:  No deformity or lesions, poor dentition.  Neck:  Large lipoma left side of neck.  Lungs:  Clear to auscultation bilaterally. No wheezes, rales, or rhonchi. No distress.  Heart:  S1, S2 present without murmurs appreciated.  Abdomen:  +BS, soft, non-tender and non-distended. No HSM noted. No guarding or rebound. No masses appreciated.  Rectal:  Deferred  Msk:  kyphosis Extremities:  Without  edema. Neurologic:  Alert and  oriented x4;  grossly normal neurologically. Skin:  Intact without significant lesions or rashes. Psych:  Alert and cooperative. Normal mood and affect.

## 2014-04-09 DIAGNOSIS — F419 Anxiety disorder, unspecified: Secondary | ICD-10-CM | POA: Diagnosis not present

## 2014-04-09 DIAGNOSIS — J449 Chronic obstructive pulmonary disease, unspecified: Secondary | ICD-10-CM | POA: Diagnosis not present

## 2014-04-09 DIAGNOSIS — M545 Low back pain: Secondary | ICD-10-CM | POA: Diagnosis not present

## 2014-04-09 DIAGNOSIS — K210 Gastro-esophageal reflux disease with esophagitis: Secondary | ICD-10-CM | POA: Diagnosis not present

## 2014-04-12 ENCOUNTER — Encounter: Payer: Self-pay | Admitting: Gastroenterology

## 2014-04-12 NOTE — Assessment & Plan Note (Signed)
Tubulovillous adenomas in 2011, overdue for surveillance. No concerning lower GI symptoms. Discussed in detail the need for surveillance now, but the patient is stating she is unable to undergo this currently. I discussed the possibility of occult malignancy and need for surveillance as soon as possible. She would like to hold off on this for a few months.   Return in 6-8 weeks to discuss.

## 2014-04-12 NOTE — Assessment & Plan Note (Signed)
80 year old female with intermittent epigastric discomfort, nausea, and decreased appetite for approximately one month, with gallbladder remaining in situ. Last EGD 2013 as described above. No improvement on Nexium daily. Will trial Prilosec once daily, proceed with EGD to assess for gastritis, PUD. Consider updated US abdomen +/- HIDA as indicated.   Proceed with upper endoscopy in the near future with Dr. Gala Romney. The risks, benefits, and alternatives have been discussed in detail with patient. They have stated understanding and desire to proceed.  6-8 weeks return

## 2014-04-15 NOTE — Progress Notes (Signed)
cc'ed to pcp °

## 2014-04-29 ENCOUNTER — Other Ambulatory Visit: Payer: Self-pay

## 2014-04-29 ENCOUNTER — Ambulatory Visit (HOSPITAL_COMMUNITY)
Admission: RE | Admit: 2014-04-29 | Discharge: 2014-04-29 | Disposition: A | Discharge status: Home / Self Care | Payer: Medicare Other | Source: Ambulatory Visit | Attending: Internal Medicine | Admitting: Internal Medicine

## 2014-04-29 ENCOUNTER — Encounter (HOSPITAL_COMMUNITY): Payer: Self-pay | Admitting: *Deleted

## 2014-04-29 ENCOUNTER — Encounter (HOSPITAL_COMMUNITY)
Admission: RE | Disposition: A | Discharge status: Home / Self Care | Payer: Self-pay | Source: Ambulatory Visit | Attending: Internal Medicine

## 2014-04-29 DIAGNOSIS — Z9889 Other specified postprocedural states: Secondary | ICD-10-CM | POA: Insufficient documentation

## 2014-04-29 DIAGNOSIS — J449 Chronic obstructive pulmonary disease, unspecified: Secondary | ICD-10-CM | POA: Diagnosis not present

## 2014-04-29 DIAGNOSIS — Z79891 Long term (current) use of opiate analgesic: Secondary | ICD-10-CM | POA: Insufficient documentation

## 2014-04-29 DIAGNOSIS — K219 Gastro-esophageal reflux disease without esophagitis: Secondary | ICD-10-CM | POA: Insufficient documentation

## 2014-04-29 DIAGNOSIS — Z79899 Other long term (current) drug therapy: Secondary | ICD-10-CM | POA: Diagnosis not present

## 2014-04-29 DIAGNOSIS — F419 Anxiety disorder, unspecified: Secondary | ICD-10-CM | POA: Insufficient documentation

## 2014-04-29 DIAGNOSIS — K3189 Other diseases of stomach and duodenum: Secondary | ICD-10-CM

## 2014-04-29 DIAGNOSIS — K449 Diaphragmatic hernia without obstruction or gangrene: Secondary | ICD-10-CM | POA: Insufficient documentation

## 2014-04-29 DIAGNOSIS — R1013 Epigastric pain: Secondary | ICD-10-CM

## 2014-04-29 DIAGNOSIS — K222 Esophageal obstruction: Secondary | ICD-10-CM | POA: Insufficient documentation

## 2014-04-29 DIAGNOSIS — M810 Age-related osteoporosis without current pathological fracture: Secondary | ICD-10-CM | POA: Insufficient documentation

## 2014-04-29 HISTORY — PX: ESOPHAGOGASTRODUODENOSCOPY: SHX5428

## 2014-04-29 SURGERY — EGD (ESOPHAGOGASTRODUODENOSCOPY)
Anesthesia: Moderate Sedation

## 2014-04-29 MED ORDER — LIDOCAINE VISCOUS 2 % MT SOLN
OROMUCOSAL | Status: AC
  Filled 2014-04-29: qty 15

## 2014-04-29 MED ORDER — MEPERIDINE HCL 100 MG/ML IJ SOLN
INTRAMUSCULAR | Status: AC
  Filled 2014-04-29: qty 2

## 2014-04-29 MED ORDER — MEPERIDINE HCL 100 MG/ML IJ SOLN
INTRAMUSCULAR | Status: DC | PRN
  Administered 2014-04-29: 50 mg via INTRAVENOUS
  Administered 2014-04-29: 25 mg via INTRAVENOUS

## 2014-04-29 MED ORDER — ONDANSETRON HCL 4 MG/2ML IJ SOLN
INTRAMUSCULAR | Status: AC
  Filled 2014-04-29: qty 2

## 2014-04-29 MED ORDER — MIDAZOLAM HCL 5 MG/5ML IJ SOLN
INTRAMUSCULAR | Status: DC | PRN
  Administered 2014-04-29: 2 mg via INTRAVENOUS
  Administered 2014-04-29: 1 mg via INTRAVENOUS

## 2014-04-29 MED ORDER — MIDAZOLAM HCL 5 MG/5ML IJ SOLN
INTRAMUSCULAR | Status: AC
  Filled 2014-04-29: qty 10

## 2014-04-29 MED ORDER — LIDOCAINE VISCOUS 2 % MT SOLN
OROMUCOSAL | Status: DC | PRN
Start: 2014-04-29 — End: 2014-04-29
  Administered 2014-04-29: 3 mL via OROMUCOSAL

## 2014-04-29 MED ORDER — STERILE WATER FOR IRRIGATION IR SOLN
Status: DC | PRN
  Administered 2014-04-29: 10:00:00

## 2014-04-29 MED ORDER — ONDANSETRON HCL 4 MG/2ML IJ SOLN
INTRAMUSCULAR | Status: DC | PRN
  Administered 2014-04-29: 4 mg via INTRAVENOUS

## 2014-04-29 MED ORDER — SODIUM CHLORIDE 0.9 % IV SOLN
INTRAVENOUS | Status: DC
  Administered 2014-04-29: 10:00:00 via INTRAVENOUS

## 2014-04-29 NOTE — Op Note (Signed)
[facility] [address] [place], [address]   ENDOSCOPY PROCEDURE REPORT  PATIENT: Lacey, [name]  MR#: [id number] BIRTHDATE: 05/01/1934 , 64  yrs. old GENDER: female ENDOSCOPIST: R.  Garfield Cornea, MD FACP FACG REFERRED BY:  [name], M.D. PROCEDURE DATE:  2014-05-17 PROCEDURE:  EGD, diagnostic INDICATIONS:  Dyspepsia?"recent symptoms completely resolved with switching her PPI from Nexium to Prilosec 20 mg daily.Marland Kitchen MEDICATIONS: Versed 3 mg IV and Demerol 75 mg IV in divided doses. Xylocaine gel orally.  Zofran 4 mg IV. ASA CLASS:      Class II  CONSENT: The risks, benefits, limitations, alternatives and imponderables have been discussed.  The potential for biopsy, esophogeal dilation, etc. have also been reviewed.  Questions have been answered.  All parties agreeable.  Please see the history and physical in the medical record for more information.  DESCRIPTION OF PROCEDURE: After the risks benefits and alternatives of the procedure were thoroughly explained, informed consent was obtained.  The EG-[id number] ([id number]) endoscope was introduced through the mouth and advanced to the second portion of the duodenum , limited by Without limitations. The instrument was slowly withdrawn as the mucosa was fully examined.    Normal-appearing, patent tubular esophagus aside from a noncritical Schatzki's ring.  Gastric cavity with quite a bit of retained food which precluded complete examination of all the gastric mucosa.  6 cm hiatal hernia.  Patent pylorus.  Normal first and second portion of the duodenum.  Gastric mucosa that was seen appeared normal. Retroflexed views revealed as previously described.     The scope was then withdrawn from the patient and the procedure completed (last intake of solid food reported to be 16 hours prior to this examination)  COMPLICATIONS: There were no immediate complications.  ENDOSCOPIC IMPRESSION: Noncritical Schatzki's ring.  Large hiatal hernia. Retained gastric contents.  RECOMMENDATIONS: Proceed with a solid-phase gastric emptying study. Continue Prilosec 20 mg daily. Office visit with [place] in 6 weeks.  REPEAT EXAM:  eSigned:  R. Garfield Cornea, MD Rosalita Chessman [facility] 05-17-2014 10:49 AM    CC:  CPT CODES: ICD CODES:  The ICD and CPT codes recommended by this software are interpretations from the data that the clinical staff has captured with the software.  The verification of the translation of this report to the ICD and CPT codes and modifiers is the sole responsibility of the health care institution and practicing physician where this report was generated.  [employer]. will not be held responsible for the validity of the ICD and CPT codes included on this report.  AMA assumes no liability for data contained or not contained herein. CPT is a Designer, television/film set of the [employer].

## 2014-04-29 NOTE — Discharge Instructions (Signed)
EGD Discharge instructions Please read the instructions outlined below and refer to this sheet in the next few weeks. These discharge instructions provide you with general information on caring for yourself after you leave the hospital. Your doctor may also give you specific instructions. While your treatment has been planned according to the most current medical practices available, unavoidable complications occasionally occur. If you have any problems or questions after discharge, please call your doctor. ACTIVITY  You may resume your regular activity but move at a slower pace for the next 24 hours.   Take frequent rest periods for the next 24 hours.   Walking will help expel (get rid of) the air and reduce the bloated feeling in your abdomen.   No driving for 24 hours (because of the anesthesia (medicine) used during the test).   You may shower.   Do not sign any important legal documents or operate any machinery for 24 hours (because of the anesthesia used during the test).  NUTRITION  Drink plenty of fluids.   You may resume your normal diet.   Begin with a light meal and progress to your normal diet.   Avoid alcoholic beverages for 24 hours or as instructed by your caregiver.  MEDICATIONS  You may resume your normal medications unless your caregiver tells you otherwise.  WHAT YOU CAN EXPECT TODAY  You may experience abdominal discomfort such as a feeling of fullness or gas pains.  FOLLOW-UP  Your doctor will discuss the results of your test with you.  SEEK IMMEDIATE MEDICAL ATTENTION IF ANY OF THE FOLLOWING OCCUR:  Excessive nausea (feeling sick to your stomach) and/or vomiting.   Severe abdominal pain and distention (swelling).   Trouble swallowing.   Temperature over 101 F (37.8 C).   Rectal bleeding or vomiting of blood.     Schedule solid-phase gastric emptying study-retained gastric contents at EGD  Continue Prilosec 20 mg daily  Office visit with [place]  in 6 weeks

## 2014-04-29 NOTE — Interval H&P Note (Signed)
History and Physical Interval Note:  04/29/2014 10:19 AM  [name]  has presented today for surgery, with the diagnosis of DYSPEPSIA  The various methods of treatment have been discussed with the patient and family. After consideration of risks, benefits and other options for treatment, the patient has consented to  Procedure(s) with comments: ESOPHAGOGASTRODUODENOSCOPY (EGD) (N/A) - 1030  as a surgical intervention .  The patient's history has been reviewed, patient examined, no change in status, stable for surgery.  I have reviewed the patient's chart and labs.  Questions were answered to the patient's satisfaction.     [name]   Patient states upper GI symptoms have totally resolved since being switched from Nexium to omeprazole.   No dysphagia. Diagnostic EGD today per plan.The risks, benefits, limitations, alternatives and imponderables have been reviewed with the patient. Potential for esophageal dilation, biopsy, etc. have also been reviewed.  Questions have been answered. All parties agreeable.

## 2014-04-29 NOTE — H&P (View-Only) (Signed)
Primary Care Physician:  Alonza Bogus, MD Primary Gastroenterologist:  Dr. Gala Romney   Chief Complaint  Patient presents with  . Gastrophageal Reflux    HPI:   [name] is a 80 y.o. female presenting today at the request of Dr. Luan Pulling secondary to uncontrolled GERD. Last EGD in 2013 with non-critical Schatzki's ring, non-manipulated, large hiatal hernia, gastric polyp and erosions with path showing ulcerated gastric antral mucosa with foveolar hyperplasia, stomach polyp with foveolar hyperplasia and xanthoma. Colonoscopy last in 2011 with multiple tubulovillous adenomas. Overdue for surveillance. SHE IS NOT INTERESTED IN A COLONOSCOPY AT THIS TIME DESPITE THE RISKS AND BENEFITS DISCUSSED IN DETAIL.   Notes epigastric discomfort/burning. Intermittent. Sometimes flares with food, sometimes not. Nexium daily. No other medications tried. No dysphagia. Feels nauseated but no vomiting. Phenergan prn. Associated weight loss. 139 in April 2013, now 121. Not much of an appetite. "stressed". Some days diarrhea, some days normal. No rectal bleeding. Gallbladder remains in situ. No NSAIDs or aspirin powders. Only tylenol products. Pain for about a month.   Past Medical History  Diagnosis Date  . High cholesterol   . Osteoporosis   . Acid reflux   . Anxiety   . COPD (chronic obstructive pulmonary disease)     Past Surgical History  Procedure Laterality Date  . Colonoscopy  10/2009    sigmoid diverticula, multiple tubulovillous adneomas, needs surveillance Oct 2013  . Esophagogastroduodenoscopy  04/16/2011    Dr. Gala Romney: Noncritical Schatzkis ring( not manipulated because no dysphagia). Normal esophagus otherwise  large hiatal hernia. Gastric polyp-status post biopsy. Gastric erosions-staus post biopsy. Abnormal bulb-status post biopsy. benign small bowel, stomach biopsy with ulcerated gastric antral mucosa with foveolar hyperplasia and surface erosion, polyp with inflamed gastric antral  mucosa     Current Outpatient Prescriptions  Medication Sig Dispense Refill  . ALPRAZolam (XANAX) 0.5 MG tablet Take 0.5 mg by mouth at bedtime as needed.     . cetirizine (ZYRTEC) 10 MG tablet Take 10 mg by mouth daily.    Marland Kitchen esomeprazole (NEXIUM) 40 MG capsule Take 40 mg by mouth daily before breakfast.    . HYDROcodone-acetaminophen (VICODIN) 5-500 MG per tablet Take 1 tablet by mouth every 6 (six) hours as needed. For Pain    . promethazine (PHENERGAN) 25 MG tablet Take 25 mg by mouth Once daily as needed. For nausea    . traMADol (ULTRAM) 50 MG tablet Take 50 mg by mouth every 6 (six) hours as needed. For pain    . zolpidem (AMBIEN) 10 MG tablet Take 5 mg by mouth at bedtime as needed.     Marland Kitchen omeprazole (PRILOSEC) 20 MG capsule Take 1 capsule (20 mg total) by mouth daily. Take 30 minutes before breakfast daily. 30 capsule 3   No current facility-administered medications for this visit.    Allergies as of 04/04/2014 - Review Complete 04/04/2014  Allergen Reaction Noted  . Codeine Shortness Of Breath and Rash   . Levofloxacin Anaphylaxis 03/05/2011  . Sulfonamide derivatives Hives and Shortness Of Breath     Family History  Problem Relation Age of Onset  . Colon cancer Neg Hx     History   Social History  . Marital Status: Married    Spouse Name: N/A  . Number of Children: N/A  . Years of Education: N/A   Occupational History  . Not on file.   Social History Main Topics  . Smoking status: Never Smoker   . Smokeless tobacco:  Not on file  . Alcohol Use: No  . Drug Use: No  . Sexual Activity: Not on file   Other Topics Concern  . Not on file   Social History Narrative    Review of Systems: Gen: Denies any fever, chills, fatigue, weight loss, lack of appetite.  CV: Denies chest pain, heart palpitations, peripheral edema, syncope.  Resp: +DOE GI: see HPI GU : Denies urinary burning, urinary frequency, urinary hesitancy MS: Denies joint pain, muscle weakness,  cramps, or limitation of movement.  Derm: Denies rash, itching, dry skin Psych: +stress Heme: Denies bruising, bleeding, and enlarged lymph nodes.  Physical Exam: BP 146/88 mmHg  Pulse 79  Temp(Src) 97 F (36.1 C)  Ht 5\' 3"  (1.6 m)  Wt 121 lb 6.4 oz (55.067 kg)  BMI 21.51 kg/m2 General:   Alert and oriented. Pleasant and cooperative. Well-nourished and well-developed.  Head:  Normocephalic and atraumatic. Eyes:  Without icterus, sclera clear and conjunctiva pink.  Ears:  Normal auditory acuity. Nose:  No deformity, discharge,  or lesions. Mouth:  No deformity or lesions, poor dentition.  Neck:  Large lipoma left side of neck.  Lungs:  Clear to auscultation bilaterally. No wheezes, rales, or rhonchi. No distress.  Heart:  S1, S2 present without murmurs appreciated.  Abdomen:  +BS, soft, non-tender and non-distended. No HSM noted. No guarding or rebound. No masses appreciated.  Rectal:  Deferred  Msk:  kyphosis Extremities:  Without  edema. Neurologic:  Alert and  oriented x4;  grossly normal neurologically. Skin:  Intact without significant lesions or rashes. Psych:  Alert and cooperative. Normal mood and affect.

## 2014-04-30 ENCOUNTER — Encounter (HOSPITAL_COMMUNITY): Payer: Self-pay | Admitting: Internal Medicine

## 2014-05-03 ENCOUNTER — Encounter (HOSPITAL_COMMUNITY)
Admission: RE | Admit: 2014-05-03 | Discharge: 2014-05-03 | Disposition: A | Discharge status: Home / Self Care | Payer: Medicare Other | Source: Ambulatory Visit | Attending: Internal Medicine | Admitting: Internal Medicine

## 2014-05-03 ENCOUNTER — Encounter (HOSPITAL_COMMUNITY): Payer: Self-pay

## 2014-05-03 DIAGNOSIS — K30 Functional dyspepsia: Secondary | ICD-10-CM | POA: Insufficient documentation

## 2014-05-03 DIAGNOSIS — K449 Diaphragmatic hernia without obstruction or gangrene: Secondary | ICD-10-CM | POA: Diagnosis not present

## 2014-05-03 DIAGNOSIS — K3189 Other diseases of stomach and duodenum: Secondary | ICD-10-CM

## 2014-05-03 MED ORDER — TECHNETIUM TC 99M SULFUR COLLOID
2.0000 | Freq: Once | INTRAVENOUS | Status: AC | PRN
  Administered 2014-05-03: 2 via ORAL

## 2014-05-06 ENCOUNTER — Other Ambulatory Visit: Payer: Self-pay | Admitting: Internal Medicine

## 2014-05-06 ENCOUNTER — Telehealth: Payer: Self-pay | Admitting: Internal Medicine

## 2014-05-06 MED ORDER — OMEPRAZOLE 20 MG PO CPDR: 20.0000 mg | DELAYED_RELEASE_CAPSULE | Freq: Two times a day (BID) | ORAL | Status: DC

## 2014-05-06 NOTE — Telephone Encounter (Signed)
[phone]  IS AN ALTERNATE NUMBER TO CALL PATIENT REGARDING RESULTS

## 2014-05-06 NOTE — Telephone Encounter (Signed)
Dr.Rourk, Do you have GES results?

## 2014-05-06 NOTE — Telephone Encounter (Signed)
See GES results. Went over results and discharge information with the pt.

## 2014-05-06 NOTE — Telephone Encounter (Signed)
Pt called asking if her procedure and lab results were available yet. I told her probably not this soon, but I would let nurse be aware that she had called. Pt said the day of her procedure her sister was with her and she doesn't remember much of what RMR told her and would like for nurse to call and tell her what her discharge instructions were. [phone]

## 2014-05-07 ENCOUNTER — Encounter: Payer: Self-pay | Admitting: Internal Medicine

## 2014-05-07 NOTE — Progress Notes (Signed)
APPOINTMENT MADE AND LETTER SENT °

## 2014-05-12 ENCOUNTER — Encounter (HOSPITAL_COMMUNITY): Payer: Self-pay | Admitting: Emergency Medicine

## 2014-05-12 ENCOUNTER — Emergency Department (HOSPITAL_COMMUNITY)
Admission: EM | Admit: 2014-05-12 | Discharge: 2014-05-12 | Disposition: A | Discharge status: Home / Self Care | Payer: Medicare Other | Attending: Emergency Medicine | Admitting: Emergency Medicine

## 2014-05-12 ENCOUNTER — Emergency Department (HOSPITAL_COMMUNITY): Payer: Medicare Other

## 2014-05-12 DIAGNOSIS — Z79899 Other long term (current) drug therapy: Secondary | ICD-10-CM | POA: Diagnosis not present

## 2014-05-12 DIAGNOSIS — M8448XA Pathological fracture, other site, initial encounter for fracture: Secondary | ICD-10-CM | POA: Diagnosis not present

## 2014-05-12 DIAGNOSIS — S22000A Wedge compression fracture of unspecified thoracic vertebra, initial encounter for closed fracture: Secondary | ICD-10-CM | POA: Diagnosis not present

## 2014-05-12 DIAGNOSIS — K219 Gastro-esophageal reflux disease without esophagitis: Secondary | ICD-10-CM | POA: Diagnosis not present

## 2014-05-12 DIAGNOSIS — R0789 Other chest pain: Secondary | ICD-10-CM | POA: Diagnosis not present

## 2014-05-12 DIAGNOSIS — R0602 Shortness of breath: Secondary | ICD-10-CM | POA: Insufficient documentation

## 2014-05-12 DIAGNOSIS — M5414 Radiculopathy, thoracic region: Secondary | ICD-10-CM | POA: Insufficient documentation

## 2014-05-12 DIAGNOSIS — M546 Pain in thoracic spine: Secondary | ICD-10-CM | POA: Diagnosis not present

## 2014-05-12 DIAGNOSIS — R079 Chest pain, unspecified: Secondary | ICD-10-CM | POA: Insufficient documentation

## 2014-05-12 DIAGNOSIS — M4854XA Collapsed vertebra, not elsewhere classified, thoracic region, initial encounter for fracture: Secondary | ICD-10-CM

## 2014-05-12 DIAGNOSIS — R0781 Pleurodynia: Secondary | ICD-10-CM | POA: Diagnosis present

## 2014-05-12 HISTORY — DX: Gastro-esophageal reflux disease without esophagitis: K21.9

## 2014-05-12 HISTORY — DX: Irritable bowel syndrome, unspecified: K58.9

## 2014-05-12 MED ORDER — HYDROCODONE-ACETAMINOPHEN 5-325 MG PO TABS: 1.0000 | ORAL_TABLET | Freq: Four times a day (QID) | ORAL | Status: DC | PRN

## 2014-05-12 MED ORDER — HYDROCODONE-ACETAMINOPHEN 5-325 MG PO TABS
1.0000 | ORAL_TABLET | Freq: Once | ORAL | Status: AC
  Administered 2014-05-12: 1 via ORAL
  Filled 2014-05-12: qty 1

## 2014-05-12 NOTE — ED Provider Notes (Signed)
CSN: [id number]     Arrival date & time 05/12/14  7741 History   First MD Initiated Contact with Patient 05/12/14 [phone]     Chief Complaint  Patient presents with  . Rib pain      (Consider location/radiation/quality/duration/timing/severity/associated sxs/prior Treatment) The history is provided by the patient.   [name] is a 80 y.o. female with a history of severe osteoporis, GERD and IBS presenting with an approximate 2 week history of right sided back pain radiating around to her right anterior chest beneath her breast.  Her pain started in her midline thoracic back but is now more persistent around the right rib cage. The pain is sharp, intermittent and worsened with movement and deep inspiration.  She endorses shortness of breath the past several days, but only when the pain is severe.  She was placed on a z pack by her pcp, finished one week ago, for uri sx including nasal congestion, rhinorrhea and nonproductive cough which has improved.  Denies fevers, rash, palpitations. She does endorse nausea which is a chronic sx associated with IBS and does not feel is related to todays complaint.  She denies abdominal pain or vomiting.  Of note, patient has recently been placed on an antiemetic and a medicine for IBS which she cannot name, prescribed by Dr. Gala Romney after undergoing a recent upper endoscopy.  She also takes tramadol q 6 hours for prn pain which is no longer relieving her orthopedic pain.  Denies any other medicines.  Has taken hydrocodone in the past with no adverse sx.   Past Medical History  Diagnosis Date  . Osteoporosis   . IBS (irritable bowel syndrome)   . GERD (gastroesophageal reflux disease)    History reviewed. No pertinent past surgical history. Family History  Problem Relation Age of Onset  . Diabetes Mother    History  Substance Use Topics  . Smoking status: Never Smoker   . Smokeless tobacco: Current User    Types: Snuff  . Alcohol Use: No   OB History     Gravida Para Term Preterm AB TAB SAB Ectopic Multiple Living   3 3 3       3      Review of Systems  Constitutional: Negative.  Negative for fever and chills.  HENT: Negative.  Negative for congestion and sore throat.   Eyes: Negative.   Respiratory: Positive for shortness of breath. Negative for chest tightness.   Cardiovascular: Positive for chest pain. Negative for palpitations.  Gastrointestinal: Positive for nausea. Negative for vomiting and abdominal pain.  Genitourinary: Negative.   Musculoskeletal: Negative for joint swelling, arthralgias and neck pain.  Skin: Negative.  Negative for rash and wound.  Neurological: Negative for dizziness, weakness, light-headedness, numbness and headaches.  Psychiatric/Behavioral: Negative.       Allergies  Codeine and Sulfa antibiotics  Home Medications   Prior to Admission medications   Medication Sig Start Date End Date Taking? Authorizing Provider  ALPRAZolam Duanne Moron) 1 MG tablet Take 1 tablet by mouth 3 (three) times daily. 04/24/14  Yes Historical Provider, MD  loratadine (CLARITIN) 10 MG tablet Take 10 mg by mouth daily.   Yes Historical Provider, MD  omeprazole (PRILOSEC) 20 MG capsule Take 1 capsule by mouth 2 (two) times daily. 05/06/14  Yes Historical Provider, MD  traMADol (ULTRAM) 50 MG tablet Take 2 tablets by mouth 4 (four) times daily as needed for moderate pain.  04/22/14  Yes Historical Provider, MD  zolpidem (AMBIEN) 10 MG tablet  Take 10 mg by mouth at bedtime as needed for sleep.   Yes Historical Provider, MD  azithromycin (ZITHROMAX) 250 MG tablet Take 250 tablets by mouth daily. 05/07/14   Historical Provider, MD  HYDROcodone-acetaminophen (NORCO/VICODIN) 5-325 MG per tablet Take 1 tablet by mouth every 6 (six) hours as needed for moderate pain. 05/12/14   Evalee Jefferson, PA-C   BP 108/85 mmHg  Pulse 68  Temp(Src) 98 F (36.7 C) (Oral)  Resp 16  Ht 5\' 4"  (1.626 m)  Wt 120 lb (54.432 kg)  BMI 20.59 kg/m2  SpO2 99% Physical  Exam  Constitutional: She appears well-developed.  Pulmonary/Chest: No respiratory distress. She has rales in the left lower field.  Musculoskeletal:       Thoracic back: She exhibits deformity.  Severe thoracic kyphosis.  TTP right lateral mid rib cage, no palpable deformity, no edema.    ED Course  Procedures (including critical care time) Labs Review Labs Reviewed - No data to display  Imaging Review Dg Ribs Unilateral W/chest Right  05/12/2014   CLINICAL DATA:  Pain for 3 1/2 weeks, no injury, pain started in mid t-spine area moved to right lateral, hx of osteoporosis  EXAM: RIGHT RIBS AND CHEST - 3+ VIEW  COMPARISON:  None.  FINDINGS: No rib fracture or rib lesion. Cardiac silhouette is mildly enlarged. Aorta is tortuous. No mediastinal or hilar masses. Probable small hiatal hernia. No lung consolidation or edema. No pleural effusion or pneumothorax.  Bony structures are diffusely demineralized.  IMPRESSION: 1. No rib fracture or rib lesion. 2. No acute cardiopulmonary disease.   Electronically Signed   By: Lajean Manes M.D.   On: 05/12/2014 09:17   Dg Thoracic Spine 2 View  05/12/2014   CLINICAL DATA:  Pain for 3 1/2 weeks, no injury, pain started in mid t-spine area moved to right lateral, hx of osteoporosis  EXAM: THORACIC SPINE - 2 VIEW  COMPARISON:  None.  FINDINGS: Compression fracture deformities at 3 contiguous levels in the mid thoracic spine approximately T5-T7. Exaggerated thoracic kyphosis.  IMPRESSION: 1. Mid thoracic compression fracture deformities, age indeterminate.   Electronically Signed   By: Lucrezia Europe M.D.   On: 05/12/2014 09:18     EKG Interpretation None      MDM   Final diagnoses:  Compression fracture of thoracic vertebra, non-traumatic, initial encounter  Radiculopathy of thoracic region    Patients labs and/or radiological studies were reviewed and considered during the medical decision making and disposition process.  Results were also discussed with  patient. Pt was also seen by Dr. Jeneen Rinks during this visit.  Hydrocodone prescribed - to take in place of tramadol.  F/u with pcp for recheck and consideration for kyphoplasty if good candidate if sx not improving.    Evalee Jefferson, PA-C 05/12/14 [place], MD 05/20/14 [phone]

## 2014-05-12 NOTE — ED Notes (Signed)
Patient c/o right rib pain that radiates under right breast x2 weeks. Patient states "I have osteoporosis and family concerned I cracked a rib."  Denies any known injury or coughing. Per patient sharp stabbing pain with deep breath. Denies any cardiac hx.

## 2014-05-12 NOTE — Discharge Instructions (Signed)
Call your doctor for a recheck within the next 2 weeks if your pain is not improving.  You may be a candidate for kyphoplasty which can help relieve the pain of collapses vertebrae and you should discuss this with Dr. Luan Pulling if your symptoms persist.  Do not drive within 4 hours of taking hydrocodone as this medication will make you drowsy.   Vertebral Fracture You have a fracture of one or more vertebra. These are the bony parts that form the spine. Minor vertebral fractures happen when people fall. Osteoporosis is associated with many of these fractures. Hospital care may not be necessary for minor compression fractures that are stable. However, multiple fractures of the spine or unstable injuries can cause severe pain and even damage the spinal cord. A spinal cord injury may cause paralysis, numbness, or loss of normal bowel and bladder control.  Normally there is pain and stiffness in the back for 3 to 6 weeks after a vertebral fracture. Bed rest for several days, pain medicine, and a slow return to activity is often the only treatment that is needed depending on the location of the fracture. Neck and back braces may be helpful in reducing pain and increasing mobility. When your pain allows, you should begin walking or swimming to help maintain your endurance. Exercises to improve motion and to strengthen the back may also be useful after the initial pain improves. Treatment for osteoporosis may be essential for full recovery. This will help reduce your risk of vertebral fractures with a future fall. During the first few days after a spine fracture you may feel nauseated or vomit. If this is severe, hospital care with IV fluids will be needed.  Arrange for follow-up care as recommended to assure proper long-term care and prevention of further spine injury.  SEEK IMMEDIATE MEDICAL CARE IF:  You have increasing pain, vomiting, or are unable to move around at all.  You develop numbness, tingling,  weakness, or paralysis of any part of your body.  You develop a loss of normal bowel or bladder control.  You have difficulty breathing, cough, fever, chest or abdominal pain. MAKE SURE YOU:   Understand these instructions.  Will watch your condition.  Will get help right away if you are not doing well or get worse. Document Released: 01/29/2004 Document Revised: 03/15/2011 Document Reviewed: 08/13/2008 [facility] Patient Information 2015 [place], [place]. This information is not intended to replace advice given to you by your health care provider. Make sure you discuss any questions you have with your health care provider.

## 2014-05-12 NOTE — ED Provider Notes (Signed)
Pt seen and evaluated.  DW Evalee Jefferson PA.  Pt reports 3 weeks of symptoms. With midthoracic back pain radiating to her right axilla, down to the anterior chest at the xiphoid. No shortness of breath, no fever, not hypoxemic. Pain worse with activity prolonged standing etc. X-ray show midthoracic compression fractures. Long discussion with patient and her daughter. Plan will be rest, avoiding activity and heavy lifting of the upper extremities, pain control. I did discuss with them the possibility of kyphoplasty if symptoms do not resolve most conservative treatment. I asked him to follow-up with her primary care physician regarding this.  Tanna Furry, MD 05/12/14 1000

## 2014-05-12 NOTE — ED Notes (Signed)
Patient with no complaints at this time. Respirations even and unlabored. Skin warm/dry. Discharge instructions reviewed with patient at this time. Patient given opportunity to voice concerns/ask questions. Patient discharged at this time and left Emergency Department with steady gait.   

## 2014-05-13 ENCOUNTER — Telehealth: Payer: Self-pay

## 2014-05-13 NOTE — Telephone Encounter (Signed)
Pt is calling because her medication BID is not working. She is wanting to know what she can do to help with the heartburn.

## 2014-05-14 MED ORDER — PANTOPRAZOLE SODIUM 40 MG PO TBEC: 40.0000 mg | DELAYED_RELEASE_TABLET | Freq: Two times a day (BID) | ORAL | Status: DC

## 2014-05-14 NOTE — Telephone Encounter (Signed)
I changed to Protonix BID.

## 2014-05-14 NOTE — Telephone Encounter (Signed)
Pt is aware.  

## 2014-05-15 NOTE — Telephone Encounter (Signed)
Received a fax from the pharmacy asking for a PA for protonix. PA was completed. Insurance sent a fax stating it has been denied. Do you want to do an appeal?

## 2014-05-16 ENCOUNTER — Telehealth: Payer: Self-pay | Admitting: Internal Medicine

## 2014-05-16 NOTE — Telephone Encounter (Signed)
We are working on an appeal.

## 2014-05-16 NOTE — Telephone Encounter (Signed)
We will need an appeal letter.

## 2014-05-16 NOTE — Telephone Encounter (Signed)
Patient insurance will not pay for her reflux medication   Please call [phone]

## 2014-05-16 NOTE — Telephone Encounter (Signed)
Yes. Need an appeal.

## 2014-05-17 NOTE — Telephone Encounter (Signed)
Pt is aware that we are working on an appeal.

## 2014-05-17 NOTE — Telephone Encounter (Signed)
Pt is aware that we are working on the appeal for her medication.

## 2014-05-21 NOTE — Telephone Encounter (Signed)
Vicente Males, are you doing an appeal letter for her?

## 2014-05-24 ENCOUNTER — Encounter: Payer: Self-pay | Admitting: Gastroenterology

## 2014-05-24 NOTE — Telephone Encounter (Signed)
Letter completed.

## 2014-05-27 ENCOUNTER — Encounter (HOSPITAL_COMMUNITY): Payer: Self-pay

## 2014-05-27 NOTE — Telephone Encounter (Signed)
Letter has been faxed to the [employer] appeals dept.

## 2014-05-30 ENCOUNTER — Ambulatory Visit (INDEPENDENT_AMBULATORY_CARE_PROVIDER_SITE_OTHER): Payer: Medicare Other | Admitting: Gastroenterology

## 2014-05-30 ENCOUNTER — Encounter: Payer: Self-pay | Admitting: Gastroenterology

## 2014-05-30 VITALS — BP 137/88 | HR 75 | Temp 97.3°F | Ht 62.0 in | Wt 118.0 lb

## 2014-05-30 DIAGNOSIS — D369 Benign neoplasm, unspecified site: Secondary | ICD-10-CM

## 2014-05-30 DIAGNOSIS — R1013 Epigastric pain: Secondary | ICD-10-CM

## 2014-05-30 MED ORDER — PANTOPRAZOLE SODIUM 40 MG PO TBEC: 40.0000 mg | DELAYED_RELEASE_TABLET | Freq: Two times a day (BID) | ORAL | Status: DC

## 2014-05-30 NOTE — Patient Instructions (Signed)
Stop Prilosec. Pick up the Protonix to take twice a day. This is at your pharmacy. Take it 30 minutes before breakfast and dinner.   Please call me in 2 weeks with how you are doing. IF you aren't better, we need to do a CT scan.   I would like to see you back in 2 months.

## 2014-05-30 NOTE — Progress Notes (Signed)
Referring Provider: [name], MD Primary Care Physician:  Alonza Bogus, MD  Primary GI: Dr. Gala Romney   Chief Complaint  Patient presents with  . Follow-up    HPI:   [name] is a 80 y.o. female presenting today with a history of dyspepsia and tubulovillous adenomas in 2011, needing surveillance but would like to hold off currently despite discussion of risks and benefits. Here in follow-up after EGD.   EGD April 2016. Dr. Gala Romney: Noncritical Schatzki's ring large hiatal hernia. Retained gastric contents. GES noted with slight delay in gastric emptying. Prilosec BID had not helped, so Protonix was started BID. We have had to appeal this. Now has been approved through Feb 2017.   Weight loss noted. 2013 weighed in 140s. 2014 was 115-120s. Appetite not good. Nausea rare, once in awhile, improved. Some upper abdominal discomfort, intermittent, not related to eating. No dysphagia. No appetite. When she goes to a family member's house she eats better.   Past Medical History  Diagnosis Date  . High cholesterol   . Osteoporosis   . Acid reflux   . Anxiety   . COPD (chronic obstructive pulmonary disease)   . Osteoporosis   . IBS (irritable bowel syndrome)   . GERD (gastroesophageal reflux disease)     Past Surgical History  Procedure Laterality Date  . Colonoscopy  10/2009    sigmoid diverticula, multiple tubulovillous adneomas, needs surveillance Oct 2013  . Esophagogastroduodenoscopy  04/16/2011    Dr. Gala Romney: Noncritical Schatzkis ring( not manipulated because no dysphagia). Normal esophagus otherwise  large hiatal hernia. Gastric polyp-status post biopsy. Gastric erosions-staus post biopsy. Abnormal bulb-status post biopsy. benign small bowel, stomach biopsy with ulcerated gastric antral mucosa with foveolar hyperplasia and surface erosion, polyp with inflamed gastric antral mucosa   . Esophagogastroduodenoscopy N/A 04/29/2014    Dr. Gala Romney: Noncritical Schatzki's ring  large hiatal hernia. Retained gastric contents.GES with slight delay    Current Outpatient Prescriptions  Medication Sig Dispense Refill  . ALPRAZolam (XANAX) 1 MG tablet Take 1 mg by mouth 2 (two) times daily as needed for anxiety.     . ALPRAZolam (XANAX) 1 MG tablet Take 1 tablet by mouth 3 (three) times daily.    Marland Kitchen azithromycin (ZITHROMAX) 250 MG tablet Take 250 tablets by mouth daily.    . cetirizine (ZYRTEC) 10 MG tablet Take 10 mg by mouth daily.    Marland Kitchen HYDROcodone-acetaminophen (NORCO/VICODIN) 5-325 MG per tablet Take 1 tablet by mouth every 6 (six) hours as needed for moderate pain. 20 tablet 0  . HYDROcodone-acetaminophen (VICODIN) 5-500 MG per tablet Take 1 tablet by mouth every 6 (six) hours as needed. For Pain    . loratadine (CLARITIN) 10 MG tablet Take 10 mg by mouth daily.    Marland Kitchen omeprazole (PRILOSEC) 20 MG capsule Take 1 capsule (20 mg total) by mouth daily. Take 30 minutes before breakfast daily. 30 capsule 3  . omeprazole (PRILOSEC) 20 MG capsule Take 1 capsule (20 mg total) by mouth 2 (two) times daily before a meal. 60 capsule 3  . omeprazole (PRILOSEC) 20 MG capsule Take 1 capsule by mouth 2 (two) times daily.    . ondansetron (ZOFRAN) 4 MG tablet Take 4 mg by mouth 4 (four) times daily as needed for nausea or vomiting.    . pantoprazole (PROTONIX) 40 MG tablet Take 1 tablet (40 mg total) by mouth 2 (two) times daily before a meal. 60 tablet 3  . traMADol (ULTRAM) 50 MG  tablet Take 50 mg by mouth every 6 (six) hours as needed. For pain    . traMADol (ULTRAM) 50 MG tablet Take 2 tablets by mouth 4 (four) times daily as needed for moderate pain.     Marland Kitchen zolpidem (AMBIEN) 10 MG tablet Take 5 mg by mouth at bedtime as needed.     . zolpidem (AMBIEN) 10 MG tablet Take 10 mg by mouth at bedtime as needed for sleep.     No current facility-administered medications for this visit.    Allergies as of 05/30/2014 - Review Complete 05/30/2014  Allergen Reaction Noted  . Codeine  Shortness Of Breath and Rash   . Codeine Anaphylaxis 05/12/2014  . Levofloxacin Anaphylaxis 03/05/2011  . Sulfonamide derivatives Hives and Shortness Of Breath   . Sulfa antibiotics Hives 05/12/2014    Family History  Problem Relation Age of Onset  . Colon cancer Neg Hx   . Stroke Mother   . Heart disease Father   . Diabetes Mother     History   Social History  . Marital Status: Married    Spouse Name: N/A  . Number of Children: N/A  . Years of Education: N/A   Social History Main Topics  . Smoking status: Never Smoker   . Smokeless tobacco: Current User    Types: Snuff  . Alcohol Use: No  . Drug Use: No  . Sexual Activity: No   Other Topics Concern  . None   Social History Narrative   ** Merged History Encounter **        Review of Systems: As mentioned in HPI  Physical Exam: BP 137/88 mmHg  Pulse 75  Temp(Src) 97.3 F (36.3 C)  Ht 5\' 2"  (1.575 m)  Wt 118 lb (53.524 kg)  BMI 21.58 kg/m2 General:   Alert and oriented. No distress noted. Pleasant and cooperative.  Head:  Normocephalic and atraumatic. Abdomen:  +BS, soft, non-tender and non-distended. No rebound or guarding. No HSM or masses noted. Msk: kyphosis Extremities:  Without edema. Neurologic:  Alert and  oriented x4;  grossly normal neurologically. Psych:  Alert and cooperative. Normal mood and affect.

## 2014-05-31 ENCOUNTER — Other Ambulatory Visit (HOSPITAL_COMMUNITY): Payer: Self-pay | Admitting: Pulmonary Disease

## 2014-05-31 DIAGNOSIS — M4850XD Collapsed vertebra, not elsewhere classified, site unspecified, subsequent encounter for fracture with routine healing: Principal | ICD-10-CM

## 2014-05-31 DIAGNOSIS — Z780 Asymptomatic menopausal state: Secondary | ICD-10-CM

## 2014-05-31 DIAGNOSIS — M5440 Lumbago with sciatica, unspecified side: Secondary | ICD-10-CM

## 2014-05-31 DIAGNOSIS — M818 Other osteoporosis without current pathological fracture: Secondary | ICD-10-CM | POA: Diagnosis not present

## 2014-05-31 DIAGNOSIS — J449 Chronic obstructive pulmonary disease, unspecified: Secondary | ICD-10-CM | POA: Diagnosis not present

## 2014-05-31 DIAGNOSIS — IMO0001 Reserved for inherently not codable concepts without codable children: Secondary | ICD-10-CM

## 2014-05-31 DIAGNOSIS — K219 Gastro-esophageal reflux disease without esophagitis: Secondary | ICD-10-CM | POA: Diagnosis not present

## 2014-06-07 ENCOUNTER — Ambulatory Visit (HOSPITAL_COMMUNITY)
Admission: RE | Admit: 2014-06-07 | Discharge: 2014-06-07 | Disposition: A | Discharge status: Home / Self Care | Payer: Medicare Other | Source: Ambulatory Visit | Attending: Pulmonary Disease | Admitting: Pulmonary Disease

## 2014-06-07 DIAGNOSIS — M5440 Lumbago with sciatica, unspecified side: Secondary | ICD-10-CM | POA: Insufficient documentation

## 2014-06-07 DIAGNOSIS — Z780 Asymptomatic menopausal state: Secondary | ICD-10-CM | POA: Insufficient documentation

## 2014-06-07 DIAGNOSIS — IMO0001 Reserved for inherently not codable concepts without codable children: Secondary | ICD-10-CM

## 2014-06-07 DIAGNOSIS — M810 Age-related osteoporosis without current pathological fracture: Secondary | ICD-10-CM | POA: Insufficient documentation

## 2014-06-07 DIAGNOSIS — M4850XD Collapsed vertebra, not elsewhere classified, site unspecified, subsequent encounter for fracture with routine healing: Secondary | ICD-10-CM | POA: Insufficient documentation

## 2014-06-07 DIAGNOSIS — M8588 Other specified disorders of bone density and structure, other site: Secondary | ICD-10-CM | POA: Diagnosis not present

## 2014-06-07 LAB — HM DEXA SCAN

## 2014-06-10 ENCOUNTER — Encounter: Payer: Self-pay | Admitting: Gastroenterology

## 2014-06-10 NOTE — Assessment & Plan Note (Signed)
80 year old female with negative EGD in 2016, delayed stomach emptying on GES, no improvement on Prilosec BID. Fortunately, we have been able to appeal Protonix, which is now covered BID for the next year. Weight loss of around 20 pounds since 2013 noted. Appetite is fair at home, but she eats well when others prepare for her or going to a family member's home. Upper abdominal discomfort is intermittent without relation to eating. I would like to trial Protonix BID for 14 days and proceed with CTA if no improvement. Weight loss is concerning but likely multifactorial. Gallbladder remains in situ but this does not clinically appear to be a biliary etiology.

## 2014-06-10 NOTE — Assessment & Plan Note (Signed)
Tubulovillous adenomas in 2011. Due for surveillance now. Patient is declining. Return in 2 months to discuss again. She feels she may be in a better position to undergo a colonoscopy at that time (multiple family members in her home currently).

## 2014-06-12 ENCOUNTER — Telehealth: Payer: Self-pay

## 2014-06-12 NOTE — Progress Notes (Signed)
CC'ED TO PCP 

## 2014-06-12 NOTE — Telephone Encounter (Signed)
States that the protonix is helping

## 2014-06-12 NOTE — Telephone Encounter (Signed)
Tried to call pt for PR on how she is doing since her OV on 05/30/2014 with Laban Emperor, NP.  Many rings and no answer.

## 2014-06-12 NOTE — Telephone Encounter (Signed)
Routing to Laban Emperor, NP.

## 2014-06-12 NOTE — Telephone Encounter (Signed)
Pt is aware.  

## 2014-06-12 NOTE — Telephone Encounter (Signed)
Great! Keep appt for July. If recurrent abdominal pain, needs CTA.

## 2014-06-27 DIAGNOSIS — M818 Other osteoporosis without current pathological fracture: Secondary | ICD-10-CM | POA: Diagnosis not present

## 2014-07-08 ENCOUNTER — Encounter (HOSPITAL_COMMUNITY): Payer: Self-pay | Admitting: Emergency Medicine

## 2014-07-08 ENCOUNTER — Emergency Department (HOSPITAL_COMMUNITY): Payer: Medicare Other

## 2014-07-08 ENCOUNTER — Emergency Department (HOSPITAL_COMMUNITY)
Admission: EM | Admit: 2014-07-08 | Discharge: 2014-07-08 | Disposition: A | Discharge status: Home / Self Care | Payer: Medicare Other | Attending: Emergency Medicine | Admitting: Emergency Medicine

## 2014-07-08 DIAGNOSIS — F419 Anxiety disorder, unspecified: Secondary | ICD-10-CM | POA: Diagnosis not present

## 2014-07-08 DIAGNOSIS — Y998 Other external cause status: Secondary | ICD-10-CM | POA: Diagnosis not present

## 2014-07-08 DIAGNOSIS — Y92009 Unspecified place in unspecified non-institutional (private) residence as the place of occurrence of the external cause: Secondary | ICD-10-CM | POA: Diagnosis not present

## 2014-07-08 DIAGNOSIS — S4991XA Unspecified injury of right shoulder and upper arm, initial encounter: Secondary | ICD-10-CM | POA: Diagnosis present

## 2014-07-08 DIAGNOSIS — J449 Chronic obstructive pulmonary disease, unspecified: Secondary | ICD-10-CM | POA: Diagnosis not present

## 2014-07-08 DIAGNOSIS — S42211A Unspecified displaced fracture of surgical neck of right humerus, initial encounter for closed fracture: Secondary | ICD-10-CM | POA: Diagnosis not present

## 2014-07-08 DIAGNOSIS — S42201A Unspecified fracture of upper end of right humerus, initial encounter for closed fracture: Secondary | ICD-10-CM

## 2014-07-08 DIAGNOSIS — Z792 Long term (current) use of antibiotics: Secondary | ICD-10-CM | POA: Diagnosis not present

## 2014-07-08 DIAGNOSIS — S42251A Displaced fracture of greater tuberosity of right humerus, initial encounter for closed fracture: Secondary | ICD-10-CM | POA: Diagnosis not present

## 2014-07-08 DIAGNOSIS — K219 Gastro-esophageal reflux disease without esophagitis: Secondary | ICD-10-CM | POA: Diagnosis not present

## 2014-07-08 DIAGNOSIS — S42291A Other displaced fracture of upper end of right humerus, initial encounter for closed fracture: Secondary | ICD-10-CM | POA: Insufficient documentation

## 2014-07-08 DIAGNOSIS — Z8639 Personal history of other endocrine, nutritional and metabolic disease: Secondary | ICD-10-CM | POA: Insufficient documentation

## 2014-07-08 DIAGNOSIS — W010XXA Fall on same level from slipping, tripping and stumbling without subsequent striking against object, initial encounter: Secondary | ICD-10-CM | POA: Insufficient documentation

## 2014-07-08 DIAGNOSIS — M810 Age-related osteoporosis without current pathological fracture: Secondary | ICD-10-CM | POA: Diagnosis not present

## 2014-07-08 DIAGNOSIS — Z79899 Other long term (current) drug therapy: Secondary | ICD-10-CM | POA: Insufficient documentation

## 2014-07-08 DIAGNOSIS — Y9301 Activity, walking, marching and hiking: Secondary | ICD-10-CM | POA: Insufficient documentation

## 2014-07-08 MED ORDER — MORPHINE SULFATE 4 MG/ML IJ SOLN
4.0000 mg | Freq: Once | INTRAMUSCULAR | Status: AC
  Administered 2014-07-08: 4 mg via INTRAVENOUS
  Filled 2014-07-08: qty 1

## 2014-07-08 MED ORDER — HYDROCODONE-ACETAMINOPHEN 5-325 MG PO TABS: 1.0000 | ORAL_TABLET | Freq: Four times a day (QID) | ORAL | Status: DC | PRN

## 2014-07-08 MED ORDER — ONDANSETRON HCL 4 MG/2ML IJ SOLN
4.0000 mg | Freq: Once | INTRAMUSCULAR | Status: AC
  Administered 2014-07-08: 4 mg via INTRAVENOUS
  Filled 2014-07-08: qty 2

## 2014-07-08 NOTE — Discharge Instructions (Signed)
Wear arm sling as applied until followed up by orthopedics.  Hydrocodone as prescribed as needed for pain.  Apply ice for 20 minutes every 2 hours while awake for the next 2 days  Follow-up with Dr. Aline Brochure this week for a recheck. His contact information has been provided in this discharge summary. Please call tomorrow to arrange this appointment for later this week.   Humerus Fracture, Treated with Immobilization The humerus is the large bone in your upper arm. You have a broken (fractured) humerus. These fractures are easily diagnosed with X-rays. TREATMENT  Simple fractures which will heal without disability are treated with simple immobilization. Immobilization means you will wear a cast, splint, or sling. You have a fracture which will do well with immobilization. The fracture will heal well simply by being held in a good position until it is stable enough to begin range of motion exercises. Do not take part in activities which would further injure your arm.  HOME CARE INSTRUCTIONS   Put ice on the injured area.  Put ice in a plastic bag.  Place a towel between your skin and the bag.  Leave the ice on for 15-20 minutes, 03-04 times a day.  If you have a cast:  Do not scratch the skin under the cast using sharp or pointed objects.  Check the skin around the cast every day. You may put lotion on any red or sore areas.  Keep your cast dry and clean.  If you have a splint:  Wear the splint as directed.  Keep your splint dry and clean.  You may loosen the elastic around the splint if your fingers become numb, tingle, or turn cold or blue.  If you have a sling:  Wear the sling as directed.  Do not put pressure on any part of your cast or splint until it is fully hardened.  Your cast or splint can be protected during bathing with a plastic bag. Do not lower the cast or splint into water.  Only take over-the-counter or prescription medicines for pain, discomfort, or  fever as directed by your caregiver.  Do range of motion exercises as instructed by your caregiver.  Follow up as directed by your caregiver. This is very important in order to avoid permanent injury or disability and chronic pain. SEEK IMMEDIATE MEDICAL CARE IF:   Your skin or nails in the injured arm turn blue or gray.  Your arm feels cold or numb.  You develop severe pain in the injured arm.  You are having problems with the medicines you were given. MAKE SURE YOU:   Understand these instructions.  Will watch your condition.  Will get help right away if you are not doing well or get worse. Document Released: 03/29/2000 Document Revised: 03/15/2011 Document Reviewed: 02/04/2010 [facility] Patient Information 2015 [place], [place]. This information is not intended to replace advice given to you by your health care provider. Make sure you discuss any questions you have with your health care provider.

## 2014-07-08 NOTE — ED Provider Notes (Signed)
CSN: [id number]     Arrival date & time 07/08/14  3903 History  This chart was scribed for Lacey Speak, MD by Evelene Croon, ED Scribe. This patient was seen in room APA09/APA09 and the patient's care was started 10:19 AM.    Chief Complaint  Patient presents with  . Fall    Patient is a 80 y.o. female presenting with fall. The history is provided by the patient. No language interpreter was used.  Fall This is a new problem. The current episode started 1 to 2 hours ago. Pertinent negatives include no chest pain, no abdominal pain, no headaches and no shortness of breath.    HPI Comments:  [name] is a 80 y.o. female who presents to the Emergency Department s/p fall this am complaining of constant 8/10  right shoulder pain following the incident. Pt states she slipped in the wet grass this morning and is unsure how she landed, no LOC. No CP. SOB, dizziness prior to fall. No alleviating factors noted.   Past Medical History  Diagnosis Date  . High cholesterol   . Osteoporosis   . Acid reflux   . Anxiety   . COPD (chronic obstructive pulmonary disease)   . Osteoporosis   . IBS (irritable bowel syndrome)   . GERD (gastroesophageal reflux disease)    Past Surgical History  Procedure Laterality Date  . Colonoscopy  10/2009    sigmoid diverticula, multiple tubulovillous adneomas, needs surveillance Oct 2013  . Esophagogastroduodenoscopy  04/16/2011    Dr. Gala Romney: Noncritical Schatzkis ring( not manipulated because no dysphagia). Normal esophagus otherwise  large hiatal hernia. Gastric polyp-status post biopsy. Gastric erosions-staus post biopsy. Abnormal bulb-status post biopsy. benign small bowel, stomach biopsy with ulcerated gastric antral mucosa with foveolar hyperplasia and surface erosion, polyp with inflamed gastric antral mucosa   . Esophagogastroduodenoscopy N/A 04/29/2014    Dr. Gala Romney: Noncritical Schatzki's ring large hiatal hernia. Retained gastric contents.GES with slight  delay   Family History  Problem Relation Age of Onset  . Colon cancer Neg Hx   . Stroke Mother   . Heart disease Father   . Diabetes Mother    History  Substance Use Topics  . Smoking status: Never Smoker   . Smokeless tobacco: Current User    Types: Snuff  . Alcohol Use: No   OB History    Gravida Para Term Preterm AB TAB SAB Ectopic Multiple Living   3 3 3  0 0 0 0 0       Review of Systems  Respiratory: Negative for shortness of breath.   Cardiovascular: Negative for chest pain.  Gastrointestinal: Negative for abdominal pain.  Musculoskeletal: Positive for myalgias and arthralgias.  Neurological: Negative for headaches.  All other systems reviewed and are negative.     Allergies  Codeine; Codeine; Levofloxacin; Sulfonamide derivatives; and Sulfa antibiotics  Home Medications   Prior to Admission medications   Medication Sig Start Date End Date Taking? Authorizing Provider  ALPRAZolam Duanne Moron) 1 MG tablet Take 1 mg by mouth 2 (two) times daily as needed for anxiety.  03/27/14  Yes Historical Provider, MD  cetirizine (ZYRTEC) 10 MG tablet Take 10 mg by mouth daily.   Yes Historical Provider, MD  ciprofloxacin (CIPRO) 250 MG tablet Take 250 mg by mouth 2 (two) times daily. Started 06/25/14 will take last tablet today 06/25/14  Yes Historical Provider, MD  HYDROcodone-acetaminophen (NORCO/VICODIN) 5-325 MG per tablet Take 1 tablet by mouth every 6 (six) hours as needed for  moderate pain. 05/12/14  Yes Evalee Jefferson, PA-C  omeprazole (PRILOSEC) 20 MG capsule Take 1 capsule (20 mg total) by mouth 2 (two) times daily before a meal. 05/06/14  Yes Daneil Dolin, MD  zolpidem (AMBIEN) 10 MG tablet Take 10 mg by mouth at bedtime as needed for sleep.   Yes Historical Provider, MD  omeprazole (PRILOSEC) 20 MG capsule Take 1 capsule (20 mg total) by mouth daily. Take 30 minutes before breakfast daily. Patient not taking: Reported on 07/08/2014 04/04/14   Orvil Feil, NP  ondansetron (ZOFRAN) 4  MG tablet Take 4 mg by mouth 4 (four) times daily as needed for nausea or vomiting.    Historical Provider, MD  pantoprazole (PROTONIX) 40 MG tablet Take 1 tablet (40 mg total) by mouth 2 (two) times daily before a meal. Patient not taking: Reported on 07/08/2014 05/30/14   Orvil Feil, NP  PROLIA 60 MG/ML SOLN injection 60 mg every 6 (six) months. 06/26/14   Historical Provider, MD  traMADol (ULTRAM) 50 MG tablet Take 50 mg by mouth every 6 (six) hours as needed. For pain    Historical Provider, MD  VOLTAREN 1 % GEL Apply 1 g topically daily as needed. pain 07/01/14   Historical Provider, MD   BP 140/110 mmHg  Pulse 84  Temp(Src) 97.6 F (36.4 C) (Oral)  Resp 22  Ht 5\' 4"  (1.626 m)  Wt 117 lb (53.071 kg)  BMI 20.07 kg/m2  SpO2 100% Physical Exam  Constitutional: She is oriented to person, place, and time. She appears well-developed and well-nourished. No distress.  HENT:  Head: Normocephalic and atraumatic.  Eyes: EOM are normal.  Neck: Normal range of motion.  Cardiovascular: Normal rate, regular rhythm and normal heart sounds.   Pulmonary/Chest: Effort normal and breath sounds normal.  Abdominal: Soft. She exhibits no distension. There is no tenderness.  Musculoskeletal: She exhibits tenderness.  TTP over the lateral right shoulder. Pain with any ROM of the RUE Ulnar and radial pulses are easily palpable Able to flex/extend all fingers. Sensation intact to entire right hand  Neurological: She is alert and oriented to person, place, and time.  Skin: Skin is warm and dry.  Psychiatric: She has a normal mood and affect. Judgment normal.  Nursing note and vitals reviewed.   ED Course  Procedures   DIAGNOSTIC STUDIES:  Oxygen Saturation is 100% on RA, Normal by my interpretation.    COORDINATION OF CARE:  10:22 AM Pt updated with XR results. Will place pt in sling and discharge with ortho referral.  Discussed treatment plan with pt at bedside and pt agreed to plan.  Labs  Review Labs Reviewed - No data to display  Imaging Review Dg Shoulder Right  07/08/2014   CLINICAL DATA:  Fall at home today.  Initial encounter.  EXAM: RIGHT SHOULDER - 2+ VIEW  COMPARISON:  None.  FINDINGS: Frontal and axillary views of the RIGHT shoulder is submitted for interpretation. There is inferior subluxation of the RIGHT shoulder. Nondisplaced fracture of the proximal humerus is present through the posterior head and extending into the surgical neck. The anatomy is distorted on the frontal projection by internal rotation. Projection distorts the scapular Y-view. Given the abnormalities on the projection, a noncontrast CT could be considered for further assessment.  IMPRESSION: Nondisplaced proximal RIGHT humerus fracture and inferior subluxation of the RIGHT humeral head.   Electronically Signed   By: Dereck Ligas M.D.   On: 07/08/2014 10:18  EKG Interpretation None      MDM   Final diagnoses:  None    Patient presents after a fall from slipping on wet grass. She is complaining of right shoulder pain. X-ray shows a proximal humerus fracture with question of posterior subluxation of the humeral head. This was followed up by CT which reveals anatomic alignment of the glenohumeral joint. She will be placed in a arm sling, prescribed pain medication, advised to ice and rest, and follow-up with Dr. Aline Brochure from orthopedics within the next week.  I personally performed the services described in this documentation, which was scribed in my presence. The recorded information has been reviewed and is accurate.      Lacey Speak, MD 07/08/14 [phone]

## 2014-07-08 NOTE — ED Notes (Signed)
Dr. Delo at bedside. 

## 2014-07-08 NOTE — ED Notes (Signed)
Patient with no complaints at this time. Respirations even and unlabored. Skin warm/dry. Discharge instructions reviewed with patient at this time. Patient given opportunity to voice concerns/ask questions. IV removed per policy and band-aid applied to site. Patient discharged at this time and left Emergency Department with steady gait.  

## 2014-07-08 NOTE — ED Notes (Signed)
Fall today at home. Pt was walking in wet grass in bedroom shoes and fell, hitting right shoulder/arm in ground. C/o right shoulder pain. CMS intact.

## 2014-07-09 ENCOUNTER — Telehealth: Payer: Self-pay | Admitting: Orthopedic Surgery

## 2014-07-09 NOTE — Telephone Encounter (Signed)
Patients daughter Kalman Shan is calling to schedule an [facility] ER F/U from 06/08/14 appt for RT shoulder Fx, please advise scheduling information, thanks

## 2014-07-09 NOTE — Telephone Encounter (Signed)
NOTED patient scheduled

## 2014-07-09 NOTE — Telephone Encounter (Signed)
No surgery needed   3 pm this Wednesday

## 2014-07-10 ENCOUNTER — Encounter: Payer: Self-pay | Admitting: Orthopedic Surgery

## 2014-07-10 ENCOUNTER — Ambulatory Visit (INDEPENDENT_AMBULATORY_CARE_PROVIDER_SITE_OTHER): Payer: Medicare Other | Admitting: Orthopedic Surgery

## 2014-07-10 VITALS — BP 125/76 | Ht 64.0 in | Wt 117.0 lb

## 2014-07-10 DIAGNOSIS — S42201A Unspecified fracture of upper end of right humerus, initial encounter for closed fracture: Secondary | ICD-10-CM | POA: Diagnosis not present

## 2014-07-10 MED ORDER — HYDROCODONE-ACETAMINOPHEN 10-325 MG PO TABS
1.0000 | ORAL_TABLET | ORAL | Status: DC | PRN
Start: 2014-07-10 — End: 2014-09-03

## 2014-07-10 NOTE — Patient Instructions (Signed)
Sling x 2 weeks

## 2014-07-10 NOTE — Progress Notes (Signed)
Patient ID: [name], female   DOB: [birth year], 80 y.o.   MRN: [id number]  Chief Complaint  Patient presents with  . Shoulder Injury    er follow up right proximal humerus fx, DOI 06/08/14     Data Reviewed X-rays done at the hospital show a nondisplaced right proximal humerus fracture. A CAT scan was done to confirm no dislocation. Pseudosubluxation as noted can occur on x-ray the proximal humerus fracture  Diagnosis Encounter Diagnosis  Name Primary?  . Proximal humeral fracture, right, closed, initial encounter Yes      Management Sling 2 weeks then x-ray. If no displacement start occupational therapy/physical therapy. Patient prefers at home as she does not drive    [name] is a 80 y.o. female.   HPI 80 years old fell on July 4. Pain is located over the right proximal humerus. She describes a dull ache moderate severity 2 days. Denies numbness or tingling reports no wrist hand dysfunction and reports that her other extremities are not hurting or and have not been injured Review of Systems See hpi  Past Medical History  Diagnosis Date  . High cholesterol   . Osteoporosis   . Acid reflux   . Anxiety   . COPD (chronic obstructive pulmonary disease)   . Osteoporosis   . IBS (irritable bowel syndrome)   . GERD (gastroesophageal reflux disease)     Past Surgical History  Procedure Laterality Date  . Colonoscopy  10/2009    sigmoid diverticula, multiple tubulovillous adneomas, needs surveillance Oct 2013  . Esophagogastroduodenoscopy  04/16/2011    Dr. Gala Romney: Noncritical Schatzkis ring( not manipulated because no dysphagia). Normal esophagus otherwise  large hiatal hernia. Gastric polyp-status post biopsy. Gastric erosions-staus post biopsy. Abnormal bulb-status post biopsy. benign small bowel, stomach biopsy with ulcerated gastric antral mucosa with foveolar hyperplasia and surface erosion, polyp with inflamed gastric antral mucosa   . Esophagogastroduodenoscopy  N/A 04/29/2014    Dr. Gala Romney: Noncritical Schatzki's ring large hiatal hernia. Retained gastric contents.GES with slight delay    Family History  Problem Relation Age of Onset  . Colon cancer Neg Hx   . Stroke Mother   . Heart disease Father   . Diabetes Mother     Social History History  Substance Use Topics  . Smoking status: Never Smoker   . Smokeless tobacco: Current User    Types: Snuff  . Alcohol Use: No    Allergies  Allergen Reactions  . Codeine Shortness Of Breath and Rash  . Codeine Anaphylaxis  . Levofloxacin Anaphylaxis    Patient was admitted to hospital with lung problems due to this medication  . Sulfonamide Derivatives Hives and Shortness Of Breath  . Sulfa Antibiotics Hives    Current Outpatient Prescriptions  Medication Sig Dispense Refill  . ALPRAZolam (XANAX) 1 MG tablet Take 1 mg by mouth 2 (two) times daily as needed for anxiety.     . Loratadine (CLARITIN PO) Take by mouth.    . traMADol (ULTRAM) 50 MG tablet Take 50 mg by mouth every 6 (six) hours as needed. For pain    . cetirizine (ZYRTEC) 10 MG tablet Take 10 mg by mouth daily.    . ciprofloxacin (CIPRO) 250 MG tablet Take 250 mg by mouth 2 (two) times daily. Started 06/25/14 will take last tablet today    . HYDROcodone-acetaminophen (NORCO) 10-325 MG per tablet Take 1 tablet by mouth every 4 (four) hours as needed. 84 tablet 0  . omeprazole (PRILOSEC)  20 MG capsule Take 1 capsule (20 mg total) by mouth daily. Take 30 minutes before breakfast daily. (Patient not taking: Reported on 07/08/2014) 30 capsule 3  . omeprazole (PRILOSEC) 20 MG capsule Take 1 capsule (20 mg total) by mouth 2 (two) times daily before a meal. 60 capsule 3  . ondansetron (ZOFRAN) 4 MG tablet Take 4 mg by mouth 4 (four) times daily as needed for nausea or vomiting.    . pantoprazole (PROTONIX) 40 MG tablet Take 1 tablet (40 mg total) by mouth 2 (two) times daily before a meal. (Patient not taking: Reported on 07/08/2014) 60 tablet 3   . PROLIA 60 MG/ML SOLN injection 60 mg every 6 (six) months.    . VOLTAREN 1 % GEL Apply 1 g topically daily as needed. pain    . zolpidem (AMBIEN) 10 MG tablet Take 10 mg by mouth at bedtime as needed for sleep.     No current facility-administered medications for this visit.       Physical Exam Blood pressure 125/76, height 5\' 4"  (1.626 m), weight 117 lb (53.071 kg). Physical Exam The patient is well developed well nourished and well groomed. Orientation to person place and time is normal  Mood is pleasant. Ambulatory status she is ambulatory does not require assistive device. She has a large mass over the inferior angle of her jaw which she says has been there since she was a child and is of no consequence or related to this injury  Right shoulder is tender in the proximal aspect she has no active motion there and we did not attempt passive motion but elbow wrist and hand range of motion are normal. We declined any stability testing muscle tone was normal she had normal skin sensation proximally and distally. She had normal pulses and color and perfusion. And the skin was ecchymotic but intact

## 2014-07-11 DIAGNOSIS — S62101A Fracture of unspecified carpal bone, right wrist, initial encounter for closed fracture: Secondary | ICD-10-CM | POA: Diagnosis not present

## 2014-07-11 DIAGNOSIS — J449 Chronic obstructive pulmonary disease, unspecified: Secondary | ICD-10-CM | POA: Diagnosis not present

## 2014-07-11 DIAGNOSIS — F419 Anxiety disorder, unspecified: Secondary | ICD-10-CM | POA: Diagnosis not present

## 2014-07-22 ENCOUNTER — Ambulatory Visit: Payer: Self-pay | Admitting: Gastroenterology

## 2014-07-25 ENCOUNTER — Encounter: Payer: Self-pay | Admitting: Orthopedic Surgery

## 2014-07-25 ENCOUNTER — Ambulatory Visit (INDEPENDENT_AMBULATORY_CARE_PROVIDER_SITE_OTHER): Payer: Medicare Other

## 2014-07-25 ENCOUNTER — Ambulatory Visit (INDEPENDENT_AMBULATORY_CARE_PROVIDER_SITE_OTHER): Payer: Self-pay | Admitting: Orthopedic Surgery

## 2014-07-25 VITALS — BP 119/76 | Ht 64.0 in | Wt 117.0 lb

## 2014-07-25 DIAGNOSIS — S42201D Unspecified fracture of upper end of right humerus, subsequent encounter for fracture with routine healing: Secondary | ICD-10-CM

## 2014-07-25 DIAGNOSIS — S42201S Unspecified fracture of upper end of right humerus, sequela: Secondary | ICD-10-CM

## 2014-07-25 MED ORDER — HYDROCODONE-ACETAMINOPHEN 7.5-325 MG PO TABS: 1.0000 | ORAL_TABLET | Freq: Four times a day (QID) | ORAL | Status: DC | PRN

## 2014-07-25 NOTE — Progress Notes (Signed)
Patient ID: [name], female   DOB: 1934-06-07, 80 y.o.   MRN: [id number]  Patient ID: [name], female   DOB: 06/03/1934, 80 y.o.   MRN: [id number]  Follow up visit  Chief Complaint  Patient presents with  . Follow-up    2 week follow up + xray right proximal humerus fracture, DOI 07/08/14    BP 119/76 mmHg  Ht 5\' 4"  (1.626 m)  Wt 117 lb (53.071 kg)  BMI 20.07 kg/m2  Encounter Diagnosis  Name Primary?  . Proximal humerus fracture, right, with routine healing, subsequent encounter Yes    Two-week follow-up status post fracture proximal humerus today's x-ray shows no change in position of the fracture  The patient is neurovascularly intact on exam.  We can start physical therapy/occupational therapy if done at the hospital outpatient facility  We will try to get home therapy because the patient cannot drive.  She has minimal family support as her family works during the day  Follow-up 6 weeks  Refill Norco for pain  Meds ordered this encounter  Medications  . HYDROcodone-acetaminophen (NORCO) 7.5-325 MG per tablet    Sig: Take 1 tablet by mouth every 6 (six) hours as needed for moderate pain.    Dispense:  56 tablet    Refill:  0

## 2014-07-26 NOTE — Addendum Note (Signed)
Addended by: Christia Reading on: 07/26/2014 08:48 AM   Modules accepted: Orders

## 2014-07-29 DIAGNOSIS — M159 Polyosteoarthritis, unspecified: Secondary | ICD-10-CM | POA: Diagnosis not present

## 2014-07-29 DIAGNOSIS — S42201D Unspecified fracture of upper end of right humerus, subsequent encounter for fracture with routine healing: Secondary | ICD-10-CM | POA: Diagnosis not present

## 2014-07-29 DIAGNOSIS — M818 Other osteoporosis without current pathological fracture: Secondary | ICD-10-CM | POA: Diagnosis not present

## 2014-07-29 DIAGNOSIS — K219 Gastro-esophageal reflux disease without esophagitis: Secondary | ICD-10-CM | POA: Diagnosis not present

## 2014-07-29 DIAGNOSIS — Z9181 History of falling: Secondary | ICD-10-CM | POA: Diagnosis not present

## 2014-08-02 DIAGNOSIS — M818 Other osteoporosis without current pathological fracture: Secondary | ICD-10-CM | POA: Diagnosis not present

## 2014-08-02 DIAGNOSIS — K219 Gastro-esophageal reflux disease without esophagitis: Secondary | ICD-10-CM | POA: Diagnosis not present

## 2014-08-02 DIAGNOSIS — S42201D Unspecified fracture of upper end of right humerus, subsequent encounter for fracture with routine healing: Secondary | ICD-10-CM | POA: Diagnosis not present

## 2014-08-02 DIAGNOSIS — M159 Polyosteoarthritis, unspecified: Secondary | ICD-10-CM | POA: Diagnosis not present

## 2014-08-02 DIAGNOSIS — Z9181 History of falling: Secondary | ICD-10-CM | POA: Diagnosis not present

## 2014-08-05 DIAGNOSIS — M818 Other osteoporosis without current pathological fracture: Secondary | ICD-10-CM | POA: Diagnosis not present

## 2014-08-05 DIAGNOSIS — Z9181 History of falling: Secondary | ICD-10-CM | POA: Diagnosis not present

## 2014-08-05 DIAGNOSIS — K219 Gastro-esophageal reflux disease without esophagitis: Secondary | ICD-10-CM | POA: Diagnosis not present

## 2014-08-05 DIAGNOSIS — S42201D Unspecified fracture of upper end of right humerus, subsequent encounter for fracture with routine healing: Secondary | ICD-10-CM | POA: Diagnosis not present

## 2014-08-05 DIAGNOSIS — M159 Polyosteoarthritis, unspecified: Secondary | ICD-10-CM | POA: Diagnosis not present

## 2014-08-06 ENCOUNTER — Telehealth: Payer: Self-pay | Admitting: Orthopedic Surgery

## 2014-08-06 NOTE — Telephone Encounter (Signed)
Received physician order in fax from physical therapist at [facility]; please advise regarding question per home therapist - main [phone].  Patient stopped in office today with daughter to ask if she is to be seen prior to her regularly scheduled appointment on 09/19/14 - mentioning an area on her arm that therapist had a question about.  Also states that the therapist who had seen her yesterday, 08/05/14, will be out for the next several days, and another therapist will be doing the home visits.  Please advise.

## 2014-08-08 DIAGNOSIS — Z9181 History of falling: Secondary | ICD-10-CM | POA: Diagnosis not present

## 2014-08-08 DIAGNOSIS — M818 Other osteoporosis without current pathological fracture: Secondary | ICD-10-CM | POA: Diagnosis not present

## 2014-08-08 DIAGNOSIS — M159 Polyosteoarthritis, unspecified: Secondary | ICD-10-CM | POA: Diagnosis not present

## 2014-08-08 DIAGNOSIS — K219 Gastro-esophageal reflux disease without esophagitis: Secondary | ICD-10-CM | POA: Diagnosis not present

## 2014-08-08 DIAGNOSIS — S42201D Unspecified fracture of upper end of right humerus, subsequent encounter for fracture with routine healing: Secondary | ICD-10-CM | POA: Diagnosis not present

## 2014-08-12 DIAGNOSIS — S42201D Unspecified fracture of upper end of right humerus, subsequent encounter for fracture with routine healing: Secondary | ICD-10-CM | POA: Diagnosis not present

## 2014-08-12 DIAGNOSIS — K219 Gastro-esophageal reflux disease without esophagitis: Secondary | ICD-10-CM | POA: Diagnosis not present

## 2014-08-12 DIAGNOSIS — M159 Polyosteoarthritis, unspecified: Secondary | ICD-10-CM | POA: Diagnosis not present

## 2014-08-12 DIAGNOSIS — M818 Other osteoporosis without current pathological fracture: Secondary | ICD-10-CM | POA: Diagnosis not present

## 2014-08-12 DIAGNOSIS — Z9181 History of falling: Secondary | ICD-10-CM | POA: Diagnosis not present

## 2014-08-14 ENCOUNTER — Telehealth: Payer: Self-pay | Admitting: Orthopedic Surgery

## 2014-08-14 DIAGNOSIS — M818 Other osteoporosis without current pathological fracture: Secondary | ICD-10-CM | POA: Diagnosis not present

## 2014-08-14 DIAGNOSIS — K219 Gastro-esophageal reflux disease without esophagitis: Secondary | ICD-10-CM | POA: Diagnosis not present

## 2014-08-14 DIAGNOSIS — S42201D Unspecified fracture of upper end of right humerus, subsequent encounter for fracture with routine healing: Secondary | ICD-10-CM | POA: Diagnosis not present

## 2014-08-14 DIAGNOSIS — M159 Polyosteoarthritis, unspecified: Secondary | ICD-10-CM | POA: Diagnosis not present

## 2014-08-14 DIAGNOSIS — Z9181 History of falling: Secondary | ICD-10-CM | POA: Diagnosis not present

## 2014-08-14 NOTE — Telephone Encounter (Signed)
Patient states has been doing home care therapy; and said has run out of her pain medication - requests refill on: HYDROcodone-acetaminophen (NORCO) 7.5-325 MG per tablet [[id number]]  Please advise.  Her ph# is [phone]

## 2014-08-15 ENCOUNTER — Other Ambulatory Visit: Payer: Self-pay | Admitting: *Deleted

## 2014-08-15 MED ORDER — HYDROCODONE-ACETAMINOPHEN 5-325 MG PO TABS: 1.0000 | ORAL_TABLET | Freq: Four times a day (QID) | ORAL | Status: DC | PRN

## 2014-08-15 NOTE — Telephone Encounter (Signed)
norco 5 q 6 prn # [phone]

## 2014-08-15 NOTE — Telephone Encounter (Signed)
Prescription available, patient aware  

## 2014-08-16 DIAGNOSIS — M818 Other osteoporosis without current pathological fracture: Secondary | ICD-10-CM | POA: Diagnosis not present

## 2014-08-16 DIAGNOSIS — M159 Polyosteoarthritis, unspecified: Secondary | ICD-10-CM | POA: Diagnosis not present

## 2014-08-16 DIAGNOSIS — S42201D Unspecified fracture of upper end of right humerus, subsequent encounter for fracture with routine healing: Secondary | ICD-10-CM | POA: Diagnosis not present

## 2014-08-16 DIAGNOSIS — Z9181 History of falling: Secondary | ICD-10-CM | POA: Diagnosis not present

## 2014-08-16 DIAGNOSIS — K219 Gastro-esophageal reflux disease without esophagitis: Secondary | ICD-10-CM | POA: Diagnosis not present

## 2014-08-19 DIAGNOSIS — Z9181 History of falling: Secondary | ICD-10-CM | POA: Diagnosis not present

## 2014-08-19 DIAGNOSIS — M818 Other osteoporosis without current pathological fracture: Secondary | ICD-10-CM | POA: Diagnosis not present

## 2014-08-19 DIAGNOSIS — S42201D Unspecified fracture of upper end of right humerus, subsequent encounter for fracture with routine healing: Secondary | ICD-10-CM | POA: Diagnosis not present

## 2014-08-19 DIAGNOSIS — M159 Polyosteoarthritis, unspecified: Secondary | ICD-10-CM | POA: Diagnosis not present

## 2014-08-19 DIAGNOSIS — K219 Gastro-esophageal reflux disease without esophagitis: Secondary | ICD-10-CM | POA: Diagnosis not present

## 2014-08-19 NOTE — Telephone Encounter (Signed)
Patient picked up Rx

## 2014-08-19 NOTE — Telephone Encounter (Signed)
Dr. Aline Brochure, please call Jonell (pharmacisit) at [facility] [phone], she has questions regarding Ms. Scheier' Rx for Hydrocodone-acetaminophen(NORCO) 7.5-325MG  PER TABLET, She is also getting #240 Tramadol from Dr. Luan Pulling that she gets filled at [facility]. Please advise?

## 2014-08-29 ENCOUNTER — Emergency Department (HOSPITAL_COMMUNITY): Payer: Medicare Other

## 2014-08-29 ENCOUNTER — Encounter (HOSPITAL_COMMUNITY): Payer: Self-pay | Admitting: Emergency Medicine

## 2014-08-29 ENCOUNTER — Emergency Department (HOSPITAL_COMMUNITY)
Admission: EM | Admit: 2014-08-29 | Discharge: 2014-08-29 | Disposition: A | Discharge status: Home / Self Care | Payer: Medicare Other | Attending: Emergency Medicine | Admitting: Emergency Medicine

## 2014-08-29 DIAGNOSIS — Y9289 Other specified places as the place of occurrence of the external cause: Secondary | ICD-10-CM | POA: Insufficient documentation

## 2014-08-29 DIAGNOSIS — W19XXXA Unspecified fall, initial encounter: Secondary | ICD-10-CM

## 2014-08-29 DIAGNOSIS — Z791 Long term (current) use of non-steroidal anti-inflammatories (NSAID): Secondary | ICD-10-CM | POA: Insufficient documentation

## 2014-08-29 DIAGNOSIS — Y998 Other external cause status: Secondary | ICD-10-CM | POA: Insufficient documentation

## 2014-08-29 DIAGNOSIS — M810 Age-related osteoporosis without current pathological fracture: Secondary | ICD-10-CM | POA: Insufficient documentation

## 2014-08-29 DIAGNOSIS — Z87828 Personal history of other (healed) physical injury and trauma: Secondary | ICD-10-CM | POA: Insufficient documentation

## 2014-08-29 DIAGNOSIS — Z8639 Personal history of other endocrine, nutritional and metabolic disease: Secondary | ICD-10-CM | POA: Diagnosis not present

## 2014-08-29 DIAGNOSIS — S52502A Unspecified fracture of the lower end of left radius, initial encounter for closed fracture: Secondary | ICD-10-CM | POA: Diagnosis not present

## 2014-08-29 DIAGNOSIS — S6992XA Unspecified injury of left wrist, hand and finger(s), initial encounter: Secondary | ICD-10-CM | POA: Diagnosis present

## 2014-08-29 DIAGNOSIS — H018 Other specified inflammations of eyelid: Secondary | ICD-10-CM | POA: Insufficient documentation

## 2014-08-29 DIAGNOSIS — J449 Chronic obstructive pulmonary disease, unspecified: Secondary | ICD-10-CM | POA: Diagnosis not present

## 2014-08-29 DIAGNOSIS — Z79899 Other long term (current) drug therapy: Secondary | ICD-10-CM | POA: Insufficient documentation

## 2014-08-29 DIAGNOSIS — F419 Anxiety disorder, unspecified: Secondary | ICD-10-CM | POA: Insufficient documentation

## 2014-08-29 DIAGNOSIS — K219 Gastro-esophageal reflux disease without esophagitis: Secondary | ICD-10-CM | POA: Diagnosis not present

## 2014-08-29 DIAGNOSIS — Y9389 Activity, other specified: Secondary | ICD-10-CM | POA: Diagnosis not present

## 2014-08-29 DIAGNOSIS — W1839XA Other fall on same level, initial encounter: Secondary | ICD-10-CM | POA: Diagnosis not present

## 2014-08-29 DIAGNOSIS — S52592A Other fractures of lower end of left radius, initial encounter for closed fracture: Secondary | ICD-10-CM | POA: Diagnosis not present

## 2014-08-29 DIAGNOSIS — S42291A Other displaced fracture of upper end of right humerus, initial encounter for closed fracture: Secondary | ICD-10-CM | POA: Diagnosis not present

## 2014-08-29 DIAGNOSIS — S42211A Unspecified displaced fracture of surgical neck of right humerus, initial encounter for closed fracture: Secondary | ICD-10-CM | POA: Diagnosis not present

## 2014-08-29 MED ORDER — BUPIVACAINE HCL (PF) 0.5 % IJ SOLN
INTRAMUSCULAR | Status: AC
  Filled 2014-08-29: qty 30

## 2014-08-29 MED ORDER — LIDOCAINE HCL (PF) 1 % IJ SOLN
INTRAMUSCULAR | Status: AC
  Filled 2014-08-29: qty 10

## 2014-08-29 NOTE — Discharge Instructions (Signed)
Radial Fracture You have a broken bone (fracture) of the forearm. This is the part of your arm between the elbow and your wrist. Your forearm is made up of two bones. These are the radius and ulna. Your fracture is in the radial shaft. This is the bone in your forearm located on the thumb side. A cast or splint is used to protect and keep your injured bone from moving. The cast or splint will be on generally for about 5 to 6 weeks, with individual variations. HOME CARE INSTRUCTIONS   Keep the injured part elevated while sitting or lying down. Keep the injury above the level of your heart (the center of the chest). This will decrease swelling and pain.  Apply ice to the injury for 15-20 minutes, 03-04 times per day while awake, for 2 days. Put the ice in a plastic bag and place a towel between the bag of ice and your cast or splint.  Move your fingers to avoid stiffness and minimize swelling.  If you have a plaster or fiberglass cast:  Do not try to scratch the skin under the cast using sharp or pointed objects.  Check the skin around the cast every day. You may put lotion on any red or sore areas.  Keep your cast dry and clean.  If you have a plaster splint:  Wear the splint as directed.  You may loosen the elastic around the splint if your fingers become numb, tingle, or turn cold or blue.  Do not put pressure on any part of your cast or splint. It may break. Rest your cast only on a pillow for the first 24 hours until it is fully hardened.  Your cast or splint can be protected during bathing with a plastic bag. Do not lower the cast or splint into water.  Only take over-the-counter or prescription medicines for pain, discomfort, or fever as directed by your caregiver. SEEK IMMEDIATE MEDICAL CARE IF:   Your cast gets damaged or breaks.  You have more severe pain or swelling than you did before getting the cast.  You have severe pain when stretching your fingers.  There is a bad  smell, new stains and/or pus-like (purulent) drainage coming from under the cast.  Your fingers or hand turn pale or blue and become cold or your loose feeling. Document Released: 06/03/2005 Document Revised: 03/15/2011 Document Reviewed: 08/30/2005 [facility] Patient Information 2015 [place], [place]. This information is not intended to replace advice given to you by your health care provider. Make sure you discuss any questions you have with your health care provider.

## 2014-08-29 NOTE — ED Notes (Signed)
[facility] [address] this am.  Injury to left wrist.  Rates pain 4/10.  Abrasion to lateral right eye.

## 2014-08-29 NOTE — ED Provider Notes (Signed)
CSN: [id number]     Arrival date & time 08/29/14  0932 History  This chart was scribe for No att. providers found by Judithann Sauger, ED Scribe. The patient was seen in room APA12/APA12 and the patient's care was started at 9:54 AM.      Chief Complaint  Patient presents with  . Wrist Pain    left  . Fall   The history is provided by the patient. No language interpreter was used.   HPI Comments: [name] is a 80 y.o. female who presents to the Emergency Department status post fall that occurred this am. She reports associated left wrist pain and an abrasion to the side of the right eye. She states that she had a previous injury to the right shoulder and adds that she has had discomfort since the fall. No alleviating factors noted.   Past Medical History  Diagnosis Date  . High cholesterol   . Osteoporosis   . Acid reflux   . Anxiety   . COPD (chronic obstructive pulmonary disease)   . Osteoporosis   . IBS (irritable bowel syndrome)   . GERD (gastroesophageal reflux disease)    Past Surgical History  Procedure Laterality Date  . Colonoscopy  10/2009    sigmoid diverticula, multiple tubulovillous adneomas, needs surveillance Oct 2013  . Esophagogastroduodenoscopy  04/16/2011    Dr. Gala Romney: Noncritical Schatzkis ring( not manipulated because no dysphagia). Normal esophagus otherwise  large hiatal hernia. Gastric polyp-status post biopsy. Gastric erosions-staus post biopsy. Abnormal bulb-status post biopsy. benign small bowel, stomach biopsy with ulcerated gastric antral mucosa with foveolar hyperplasia and surface erosion, polyp with inflamed gastric antral mucosa   . Esophagogastroduodenoscopy N/A 04/29/2014    Dr. Gala Romney: Noncritical Schatzki's ring large hiatal hernia. Retained gastric contents.GES with slight delay   Family History  Problem Relation Age of Onset  . Colon cancer Neg Hx   . Stroke Mother   . Heart disease Father   . Diabetes Mother    Social History   Substance Use Topics  . Smoking status: Never Smoker   . Smokeless tobacco: Current User    Types: Snuff  . Alcohol Use: No   OB History    Gravida Para Term Preterm AB TAB SAB Ectopic Multiple Living   3 3 3  0 0 0 0 0       Review of Systems  Constitutional: Negative for fever.  Musculoskeletal: Positive for arthralgias.  Skin: Positive for wound.  Neurological: Negative for dizziness, light-headedness, numbness and headaches.      Allergies  Codeine; Codeine; Levofloxacin; Sulfonamide derivatives; and Sulfa antibiotics  Home Medications   Prior to Admission medications   Medication Sig Start Date End Date Taking? Authorizing Provider  ALPRAZolam Duanne Moron) 1 MG tablet Take 1 mg by mouth 2 (two) times daily as needed for anxiety.  03/27/14  Yes Historical Provider, MD  cetirizine (ZYRTEC) 10 MG tablet Take 10 mg by mouth daily.   Yes Historical Provider, MD  HYDROcodone-acetaminophen (NORCO/VICODIN) 5-325 MG per tablet Take 1 tablet by mouth every 6 (six) hours as needed for moderate pain. 08/15/14  Yes Carole Civil, MD  omeprazole (PRILOSEC) 20 MG capsule Take 1 capsule (20 mg total) by mouth 2 (two) times daily before a meal. 05/06/14  Yes Daneil Dolin, MD  PROLIA 60 MG/ML SOLN injection 60 mg every 6 (six) months. 06/26/14  Yes Historical Provider, MD  traMADol (ULTRAM) 50 MG tablet Take 50 mg by mouth every 6 (six)  hours as needed. For pain   Yes Historical Provider, MD  VOLTAREN 1 % GEL Apply 1 g topically daily as needed. pain 07/01/14  Yes Historical Provider, MD  zolpidem (AMBIEN) 10 MG tablet Take 10 mg by mouth at bedtime as needed for sleep.   Yes Historical Provider, MD  HYDROcodone-acetaminophen (NORCO) 10-325 MG per tablet Take 1 tablet by mouth every 4 (four) hours as needed. Patient not taking: Reported on 08/29/2014 07/10/14   Carole Civil, MD  HYDROcodone-acetaminophen [facility]) 7.5-325 MG per tablet Take 1 tablet by mouth every 6 (six) hours as needed for  moderate pain. Patient not taking: Reported on 08/29/2014 07/25/14   Carole Civil, MD  omeprazole (PRILOSEC) 20 MG capsule Take 1 capsule (20 mg total) by mouth daily. Take 30 minutes before breakfast daily. Patient not taking: Reported on 07/08/2014 04/04/14   Orvil Feil, NP  pantoprazole (PROTONIX) 40 MG tablet Take 1 tablet (40 mg total) by mouth 2 (two) times daily before a meal. Patient not taking: Reported on 07/08/2014 05/30/14   Orvil Feil, NP   BP 152/110 mmHg  Pulse 76  Temp(Src) 98 F (36.7 C) (Oral)  Resp 20  Ht 5\' 3"  (1.6 m)  Wt 115 lb (52.164 kg)  BMI 20.38 kg/m2  SpO2 100% Physical Exam  Constitutional: She is oriented to person, place, and time. She appears well-developed and well-nourished. No distress.  HENT:  Head: Normocephalic and atraumatic.  Eyes: Conjunctivae and EOM are normal.  Neck: Neck supple. No tracheal deviation present.  Cardiovascular: Normal rate.   Pulmonary/Chest: Effort normal and breath sounds normal. No respiratory distress. She exhibits no tenderness.  Abdominal: There is no tenderness. There is no rebound and no guarding.  Musculoskeletal: Normal range of motion. She exhibits tenderness.  Chest poor dentition Tenderness over right proximal humerus posteriorly, some deformities, no ecchymosis. Some deformities and tenderness over left wrist.   Good capillary refill and radial pulse  Neurological: She is alert and oriented to person, place, and time.  Skin: Skin is warm and dry.  88mm ulcer on right peri-orbital area No underlining bony tenderness No scalp tenderness Skin intact  Psychiatric: She has a normal mood and affect. Her behavior is normal.  Nursing note and vitals reviewed.   ED Course  ORTHOPEDIC INJURY TREATMENT Date/Time: 08/29/2014 12:00 PM Performed by: Davonna Belling Authorized by: Davonna Belling Consent: Verbal consent obtained. Written consent not obtained. Risks and benefits: risks, benefits and alternatives  were discussed Consent given by: patient Patient understanding: patient states understanding of the procedure being performed Imaging studies: imaging studies available Required items: required blood products, implants, devices, and special equipment available Patient identity confirmed: verbally with patient Time out: Immediately prior to procedure a "time out" was called to verify the correct patient, procedure, equipment, support staff and site/side marked as required. Injury location: wrist Location details: left wrist Injury type: fracture Fracture type: distal radius Pre-procedure neurovascular assessment: neurovascularly intact Pre-procedure distal perfusion: normal Pre-procedure neurological function: normal Pre-procedure range of motion: reduced Local anesthesia used: yes Anesthesia: hematoma block Local anesthetic: lidocaine 1% without epinephrine and bupivacaine 0.5% with epinephrine Anesthetic total: 3 ml Patient sedated: no Manipulation performed: yes Skeletal traction used: yes Reduction successful: yes X-ray confirmed reduction: yes Immobilization: splint Splint type: sugar tong Post-procedure neurovascular assessment: post-procedure neurovascularly intact Post-procedure distal perfusion: normal Post-procedure neurological function: normal Post-procedure range of motion comment: splinted Patient tolerance: Patient tolerated the procedure well with no immediate complications   (including  critical care time) DIAGNOSTIC STUDIES: Oxygen Saturation is 100% on RA, normal by my interpretation.    COORDINATION OF CARE: 9:56 AM- Pt advised of plan for treatment and pt agrees.    Labs Review Labs Reviewed - No data to display  Imaging Review Dg Shoulder Right  08/29/2014   CLINICAL DATA:  Fall today with acute right shoulder injury and pain. Known fracture of the right humerus on 07/08/2014.  EXAM: RIGHT SHOULDER - 2+ VIEW  COMPARISON:  07/08/2014 CT and 07/25/2014  radiograph.  FINDINGS: A mildly comminuted fracture of the right humeral head and neck appears unchanged since recent studies. There is no evidence of subluxation or dislocation.  No new fracture is identified.  IMPRESSION: Unchanged appearance of the right shoulder with mildly comminuted fracture of the right humeral head and neck. No new abnormalities identified.   Electronically Signed   By: Margarette Canada M.D.   On: 08/29/2014 10:28   Dg Wrist Complete Left  08/29/2014   CLINICAL DATA:  Left wrist fracture-post reduction.  EXAM: LEFT WRIST - COMPLETE 3+ VIEW  COMPARISON:  08/29/2014  FINDINGS: A plaster cast is now noted limiting fine detail.  A comminuted intra-articular distal radial fracture is noted in near-anatomic alignment and position.  There is no evidence subluxation or dislocation.  IMPRESSION: Distal radial fracture, now in near anatomic alignment and position.   Electronically Signed   By: Margarette Canada M.D.   On: 08/29/2014 11:42   Dg Wrist Complete Left  08/29/2014   CLINICAL DATA:  Left wrist pain since a fall this morning. Initial encounter.  EXAM: LEFT WRIST - COMPLETE 3+ VIEW  COMPARISON:  None.  FINDINGS: The patient has a transverse fracture through the metaphysis of the distal radius. There is approximately 0.7 cm dorsal displacement and dorsal angulation of approximately 40 degrees. The fracture does not definitely reach the articular surface. No other fracture is identified. Ulnar positive variance secondary to the fracture is noted. There is soft tissue swelling about the wrist.  IMPRESSION: Mildly impacted and dorsally angulated fracture of the distal radial metaphysis with associated soft tissue swelling.   Electronically Signed   By: Inge Rise M.D.   On: 08/29/2014 10:24   I have personally reviewed and evaluated these images and lab results as part of my medical decision-making.   EKG Interpretation None      MDM   Final diagnoses:  Distal radius fracture, left,  closed, initial encounter    Patient with fall. Wrist fracture on left. Reduced by myself and splinted. Improved alignment. Will have follow-up with orthopedic surgery. Also has pain in her right shoulder with the recent fracture. X-ray stable. Patient states she is fine to manage at home with the help that she has that.  I personally performed the services described in this documentation, which was scribed in my presence. The recorded information has been reviewed and is accurate.    Davonna Belling, MD 08/29/14 [phone]

## 2014-08-29 NOTE — ED Notes (Signed)
Following triage, pt c/o discomfort to right shoulder from previous injury (July 4th 2016).

## 2014-09-03 ENCOUNTER — Ambulatory Visit: Payer: Medicare Other | Admitting: Orthopedic Surgery

## 2014-09-03 ENCOUNTER — Ambulatory Visit (INDEPENDENT_AMBULATORY_CARE_PROVIDER_SITE_OTHER): Payer: Medicare Other | Admitting: Orthopedic Surgery

## 2014-09-03 ENCOUNTER — Ambulatory Visit (INDEPENDENT_AMBULATORY_CARE_PROVIDER_SITE_OTHER): Payer: Medicare Other

## 2014-09-03 VITALS — BP 180/115 | Ht 63.0 in | Wt 115.0 lb

## 2014-09-03 DIAGNOSIS — S62102D Fracture of unspecified carpal bone, left wrist, subsequent encounter for fracture with routine healing: Secondary | ICD-10-CM | POA: Diagnosis not present

## 2014-09-03 DIAGNOSIS — S62102A Fracture of unspecified carpal bone, left wrist, initial encounter for closed fracture: Secondary | ICD-10-CM

## 2014-09-03 MED ORDER — HYDROCODONE-ACETAMINOPHEN 10-325 MG PO TABS: 1.0000 | ORAL_TABLET | ORAL | Status: DC | PRN

## 2014-09-03 NOTE — Progress Notes (Signed)
Patient ID: [name], female   DOB: 05-05-1934, 80 y.o.   MRN: [id number] Chief Complaint  Patient presents with  . Wrist Injury    er follow up, left wrist fracture, DOI 08/29/14    History this is a 80 year old female show proximal humerus fracture we are in process of treating her she still on a sling she fell again on the 25th injured her left wrist. She had closed reduction with good result. Comes in approximately a week later for evaluation and management. She complains of left wrist discomfort pain no elbow pain or shoulder pain on the left mild discomfort on the right from the other fracture which was also x-ray. There was no change in the fracture on the right.  Pain in her left wrist is mild dull ache.  System review as described no new symptoms  Past Medical History  Diagnosis Date  . High cholesterol   . Osteoporosis   . Acid reflux   . Anxiety   . COPD (chronic obstructive pulmonary disease)   . Osteoporosis   . IBS (irritable bowel syndrome)   . GERD (gastroesophageal reflux disease)    BP 180/115 mmHg  Ht 5\' 3"  (1.6 m)  Wt 115 lb (52.164 kg)  BMI 20.38 kg/m2 Appearance is normal frame is small oriented 3 mood is normal gait is normal  Left wrist has no deformity neurovascular exam is intact elbow and shoulder range of motion is normal and those joints are stable motor exam muscle tone normal left upper extremity skin intact pulses good sensation normal lymph nodes negative  X-ray was repeated fracture is maintained alignment patient placed in a short arm well molded cast to come back in a week for repeat x-ray  No orders of the defined types were placed in this encounter.   Norco 10 mg every 4 #42

## 2014-09-05 ENCOUNTER — Ambulatory Visit: Payer: Medicare Other | Admitting: Orthopedic Surgery

## 2014-09-12 ENCOUNTER — Ambulatory Visit (INDEPENDENT_AMBULATORY_CARE_PROVIDER_SITE_OTHER): Payer: Medicare Other

## 2014-09-12 ENCOUNTER — Ambulatory Visit (INDEPENDENT_AMBULATORY_CARE_PROVIDER_SITE_OTHER): Payer: Medicare Other | Admitting: Orthopedic Surgery

## 2014-09-12 ENCOUNTER — Encounter: Payer: Self-pay | Admitting: Orthopedic Surgery

## 2014-09-12 VITALS — BP 119/71 | Ht 63.0 in | Wt 115.0 lb

## 2014-09-12 DIAGNOSIS — M4850XD Collapsed vertebra, not elsewhere classified, site unspecified, subsequent encounter for fracture with routine healing: Secondary | ICD-10-CM | POA: Diagnosis not present

## 2014-09-12 DIAGNOSIS — J449 Chronic obstructive pulmonary disease, unspecified: Secondary | ICD-10-CM | POA: Diagnosis not present

## 2014-09-12 DIAGNOSIS — S62102D Fracture of unspecified carpal bone, left wrist, subsequent encounter for fracture with routine healing: Secondary | ICD-10-CM

## 2014-09-12 DIAGNOSIS — F419 Anxiety disorder, unspecified: Secondary | ICD-10-CM | POA: Diagnosis not present

## 2014-09-12 DIAGNOSIS — S42201D Unspecified fracture of upper end of right humerus, subsequent encounter for fracture with routine healing: Secondary | ICD-10-CM

## 2014-09-12 DIAGNOSIS — Z23 Encounter for immunization: Secondary | ICD-10-CM | POA: Diagnosis not present

## 2014-09-12 NOTE — Progress Notes (Signed)
Patient ID: KEON BENSCOTER, female   DOB: [birth year], 80 y.o.   MRN: [id number]  Follow up visit  Chief Complaint  Patient presents with  . Follow-up    1 week follow up + xray left wrist frature, DOI 08/29/14    BP 119/71 mmHg  Ht 5\' 3"  (1.6 m)  Wt 115 lb (52.164 kg)  BMI 20.38 kg/m2  Encounter Diagnoses  Name Primary?  . Wrist fracture, left, with routine healing, subsequent encounter Yes  . Proximal humerus fracture, right, with routine healing, subsequent encounter     He comes in for reevaluation of her left wrist fracture which was placed in a cast date of injury August 25. No problems cast intact fitting well. X-rays show maintenance of fracture position recommend continue cast x-ray out of plaster 1 week  In the interim of her proximal humerus fracture she of course has fractured his left wrist. She is asking whether or not she can come out of her right shoulder sling. She did reinjure the shoulder with a contusion when she fell and injured the wrist. She says she got 3 visits of physical therapy and should've gotten 6. Although she has no tenderness in the right shoulder she has some bruising and ecchymosis over the anterior deltoid and her elevation is only 100. She would benefit from further physical therapy.  We will order home therapy as she does not have a ride

## 2014-09-12 NOTE — Patient Instructions (Signed)
WE WILL REORDER HOME HEALTH PHYSICAL THERAPY

## 2014-09-13 ENCOUNTER — Other Ambulatory Visit: Payer: Self-pay | Admitting: *Deleted

## 2014-09-13 NOTE — Addendum Note (Signed)
Addended by: Baldomero Lamy B on: 09/13/2014 09:51 AM   Modules accepted: Orders

## 2014-09-19 ENCOUNTER — Ambulatory Visit (INDEPENDENT_AMBULATORY_CARE_PROVIDER_SITE_OTHER): Payer: Medicare Other

## 2014-09-19 ENCOUNTER — Ambulatory Visit (INDEPENDENT_AMBULATORY_CARE_PROVIDER_SITE_OTHER): Payer: Medicare Other | Admitting: Orthopedic Surgery

## 2014-09-19 VITALS — BP 116/81 | Ht 63.0 in | Wt 115.0 lb

## 2014-09-19 DIAGNOSIS — S42201D Unspecified fracture of upper end of right humerus, subsequent encounter for fracture with routine healing: Secondary | ICD-10-CM | POA: Diagnosis not present

## 2014-09-19 DIAGNOSIS — S62102D Fracture of unspecified carpal bone, left wrist, subsequent encounter for fracture with routine healing: Secondary | ICD-10-CM

## 2014-09-19 DIAGNOSIS — S62102A Fracture of unspecified carpal bone, left wrist, initial encounter for closed fracture: Secondary | ICD-10-CM

## 2014-09-19 MED ORDER — HYDROCODONE-ACETAMINOPHEN 10-325 MG PO TABS: 1.0000 | ORAL_TABLET | Freq: Four times a day (QID) | ORAL | Status: DC | PRN

## 2014-09-19 NOTE — Progress Notes (Signed)
Patient ID: [name], female   DOB: 1934/11/07, 80 y.o.   MRN: [id number]  Follow up visit  Chief Complaint  Patient presents with  . Follow-up    8 week follow up Right proximal humurus fx, DOI 07/08/14    BP 116/81 mmHg  Ht 5\' 3"  (1.6 m)  Wt 115 lb (52.164 kg)  BMI 20.38 kg/m2  Encounter Diagnoses  Name Primary?  . Left wrist fracture, with routine healing, subsequent encounter Yes  . Left wrist fracture, closed, initial encounter   Right shoulder proximal humerus fracture  Right proximal humerus fracture 8 week follow-up from the last visit initial injury July 4. She finished whatever therapy she had we tried to reorder it still having gotten that yet complains of shoulder pain. Her passive range of motion is actually very good she has some pain on forward elevation after 120 of flexion she probably has some mild bursitis and impingement  Recommend subacromial injection right shoulder     Procedure note the subacromial injection shoulder RIGHT  Verbal consent was obtained to inject the  RIGHT   Shoulder  Timeout was completed to confirm the injection site is a subacromial space of the  RIGHT  shoulder   Medication used Depo-Medrol 40 mg and lidocaine 1% 3 cc  Anesthesia was provided by ethyl chloride  The injection was performed in the RIGHT  posterior subacromial space. After pinning the skin with alcohol and anesthetized the skin with ethyl chloride the subacromial space was injected using a 20-gauge needle. There were no complications  Sterile dressing was applied.  She is also following up for her left wrist fracture this is a three-week in plaster x-ray  That x-ray I have reviewed and detailed the following: Comminuted fracture stable configuration mild displacement and impaction  Recommend x-ray 3 weeks out of plaster

## 2014-09-21 DIAGNOSIS — S62002D Unspecified fracture of navicular [scaphoid] bone of left wrist, subsequent encounter for fracture with routine healing: Secondary | ICD-10-CM | POA: Diagnosis not present

## 2014-09-21 DIAGNOSIS — S42201D Unspecified fracture of upper end of right humerus, subsequent encounter for fracture with routine healing: Secondary | ICD-10-CM | POA: Diagnosis not present

## 2014-09-21 DIAGNOSIS — M130 Polyarthritis, unspecified: Secondary | ICD-10-CM | POA: Diagnosis not present

## 2014-09-21 DIAGNOSIS — Z9181 History of falling: Secondary | ICD-10-CM | POA: Diagnosis not present

## 2014-09-25 DIAGNOSIS — S42201D Unspecified fracture of upper end of right humerus, subsequent encounter for fracture with routine healing: Secondary | ICD-10-CM | POA: Diagnosis not present

## 2014-09-25 DIAGNOSIS — S62002D Unspecified fracture of navicular [scaphoid] bone of left wrist, subsequent encounter for fracture with routine healing: Secondary | ICD-10-CM | POA: Diagnosis not present

## 2014-09-25 DIAGNOSIS — Z9181 History of falling: Secondary | ICD-10-CM | POA: Diagnosis not present

## 2014-09-25 DIAGNOSIS — M130 Polyarthritis, unspecified: Secondary | ICD-10-CM | POA: Diagnosis not present

## 2014-09-26 DIAGNOSIS — Z9181 History of falling: Secondary | ICD-10-CM | POA: Diagnosis not present

## 2014-09-26 DIAGNOSIS — S42201D Unspecified fracture of upper end of right humerus, subsequent encounter for fracture with routine healing: Secondary | ICD-10-CM | POA: Diagnosis not present

## 2014-09-26 DIAGNOSIS — M130 Polyarthritis, unspecified: Secondary | ICD-10-CM | POA: Diagnosis not present

## 2014-09-26 DIAGNOSIS — S62002D Unspecified fracture of navicular [scaphoid] bone of left wrist, subsequent encounter for fracture with routine healing: Secondary | ICD-10-CM | POA: Diagnosis not present

## 2014-09-30 ENCOUNTER — Telehealth: Payer: Self-pay | Admitting: Orthopedic Surgery

## 2014-09-30 DIAGNOSIS — M130 Polyarthritis, unspecified: Secondary | ICD-10-CM | POA: Diagnosis not present

## 2014-09-30 DIAGNOSIS — S62002D Unspecified fracture of navicular [scaphoid] bone of left wrist, subsequent encounter for fracture with routine healing: Secondary | ICD-10-CM | POA: Diagnosis not present

## 2014-09-30 DIAGNOSIS — Z9181 History of falling: Secondary | ICD-10-CM | POA: Diagnosis not present

## 2014-09-30 DIAGNOSIS — S42201D Unspecified fracture of upper end of right humerus, subsequent encounter for fracture with routine healing: Secondary | ICD-10-CM | POA: Diagnosis not present

## 2014-09-30 NOTE — Telephone Encounter (Signed)
Call/voice message received from Anna, physical therapist for [facility] inquiring about specific information as patient's type of wrist fracture.  Please advise - his ph# is [phone].

## 2014-10-01 NOTE — Telephone Encounter (Signed)
Returned call, no answer, left vm advising that therapy ordered on proximal humerus fracture only

## 2014-10-01 NOTE — Telephone Encounter (Signed)
Relayed; physical therapist Suezanne Jacquet also requests copy of order (including page w/ICD-10 diagnosis information) to be faxed to Attention: Fanny Skates, RN, [facility]Fax [phone]. Done.

## 2014-10-03 DIAGNOSIS — S42201D Unspecified fracture of upper end of right humerus, subsequent encounter for fracture with routine healing: Secondary | ICD-10-CM | POA: Diagnosis not present

## 2014-10-03 DIAGNOSIS — M130 Polyarthritis, unspecified: Secondary | ICD-10-CM | POA: Diagnosis not present

## 2014-10-03 DIAGNOSIS — Z9181 History of falling: Secondary | ICD-10-CM | POA: Diagnosis not present

## 2014-10-03 DIAGNOSIS — S62002D Unspecified fracture of navicular [scaphoid] bone of left wrist, subsequent encounter for fracture with routine healing: Secondary | ICD-10-CM | POA: Diagnosis not present

## 2014-10-08 DIAGNOSIS — Z9181 History of falling: Secondary | ICD-10-CM | POA: Diagnosis not present

## 2014-10-08 DIAGNOSIS — S62002D Unspecified fracture of navicular [scaphoid] bone of left wrist, subsequent encounter for fracture with routine healing: Secondary | ICD-10-CM | POA: Diagnosis not present

## 2014-10-08 DIAGNOSIS — M130 Polyarthritis, unspecified: Secondary | ICD-10-CM | POA: Diagnosis not present

## 2014-10-08 DIAGNOSIS — S42201D Unspecified fracture of upper end of right humerus, subsequent encounter for fracture with routine healing: Secondary | ICD-10-CM | POA: Diagnosis not present

## 2014-10-10 ENCOUNTER — Ambulatory Visit (INDEPENDENT_AMBULATORY_CARE_PROVIDER_SITE_OTHER): Payer: Medicare Other

## 2014-10-10 ENCOUNTER — Ambulatory Visit (INDEPENDENT_AMBULATORY_CARE_PROVIDER_SITE_OTHER): Payer: Medicare Other | Admitting: Orthopedic Surgery

## 2014-10-10 ENCOUNTER — Encounter: Payer: Self-pay | Admitting: Orthopedic Surgery

## 2014-10-10 VITALS — BP 94/72 | Ht 63.0 in | Wt 115.0 lb

## 2014-10-10 DIAGNOSIS — S62102D Fracture of unspecified carpal bone, left wrist, subsequent encounter for fracture with routine healing: Secondary | ICD-10-CM

## 2014-10-10 MED ORDER — HYDROCODONE-ACETAMINOPHEN 7.5-325 MG PO TABS: 1.0000 | ORAL_TABLET | Freq: Three times a day (TID) | ORAL | Status: DC | PRN

## 2014-10-10 NOTE — Progress Notes (Signed)
Patient ID: [name], female   DOB: 1934/04/06, 80 y.o.   MRN: [id number]  Follow up visit  Chief Complaint  Patient presents with  . Follow-up    3 week recheck on left wrist fracture with xray, DOI 8.25.2016    BP 94/72 mmHg  Ht 5\' 3"  (1.6 m)  Wt 115 lb (52.164 kg)  BMI 20.38 kg/m2  Encounter Diagnosis  Name Primary?  . Wrist fracture, left, with routine healing, subsequent encounter Yes    6 weeks post injury to the wrist on the left (July injury was for the shoulder on the right)  She's here for x-ray of her left wrist she also is a proximal humerus fracture which is being treated with physical therapy and she got a subacromial injection on her last visit  Mild tenderness over the distal radius so I'll splinted for 6 weeks  Meds ordered this encounter  Medications  . HYDROcodone-acetaminophen (NORCO) 7.5-325 MG tablet    Sig: Take 1 tablet by mouth every 8 (eight) hours as needed for moderate pain.    Dispense:  60 tablet    Refill:  0    Today's films we see what is apparently a healed distal radius fracture with slight volar tilt slight shortening  Recommend bracing and follow-up in 6 weeks. No repeat x-rays needed unless symptomatic

## 2014-10-11 DIAGNOSIS — S42201D Unspecified fracture of upper end of right humerus, subsequent encounter for fracture with routine healing: Secondary | ICD-10-CM | POA: Diagnosis not present

## 2014-10-11 DIAGNOSIS — Z9181 History of falling: Secondary | ICD-10-CM | POA: Diagnosis not present

## 2014-10-11 DIAGNOSIS — M130 Polyarthritis, unspecified: Secondary | ICD-10-CM | POA: Diagnosis not present

## 2014-10-11 DIAGNOSIS — S62002D Unspecified fracture of navicular [scaphoid] bone of left wrist, subsequent encounter for fracture with routine healing: Secondary | ICD-10-CM | POA: Diagnosis not present

## 2014-10-14 DIAGNOSIS — S42201D Unspecified fracture of upper end of right humerus, subsequent encounter for fracture with routine healing: Secondary | ICD-10-CM | POA: Diagnosis not present

## 2014-10-14 DIAGNOSIS — M130 Polyarthritis, unspecified: Secondary | ICD-10-CM | POA: Diagnosis not present

## 2014-10-14 DIAGNOSIS — Z9181 History of falling: Secondary | ICD-10-CM | POA: Diagnosis not present

## 2014-10-14 DIAGNOSIS — S62002D Unspecified fracture of navicular [scaphoid] bone of left wrist, subsequent encounter for fracture with routine healing: Secondary | ICD-10-CM | POA: Diagnosis not present

## 2014-10-15 DIAGNOSIS — B370 Candidal stomatitis: Secondary | ICD-10-CM | POA: Diagnosis not present

## 2014-10-15 DIAGNOSIS — M810 Age-related osteoporosis without current pathological fracture: Secondary | ICD-10-CM | POA: Diagnosis not present

## 2014-10-15 DIAGNOSIS — J449 Chronic obstructive pulmonary disease, unspecified: Secondary | ICD-10-CM | POA: Diagnosis not present

## 2014-10-15 DIAGNOSIS — J8410 Pulmonary fibrosis, unspecified: Secondary | ICD-10-CM | POA: Diagnosis not present

## 2014-10-16 DIAGNOSIS — S42201D Unspecified fracture of upper end of right humerus, subsequent encounter for fracture with routine healing: Secondary | ICD-10-CM | POA: Diagnosis not present

## 2014-10-16 DIAGNOSIS — Z9181 History of falling: Secondary | ICD-10-CM | POA: Diagnosis not present

## 2014-10-16 DIAGNOSIS — S62002D Unspecified fracture of navicular [scaphoid] bone of left wrist, subsequent encounter for fracture with routine healing: Secondary | ICD-10-CM | POA: Diagnosis not present

## 2014-10-16 DIAGNOSIS — M130 Polyarthritis, unspecified: Secondary | ICD-10-CM | POA: Diagnosis not present

## 2014-10-28 ENCOUNTER — Encounter: Payer: Self-pay | Admitting: Orthopedic Surgery

## 2014-10-28 ENCOUNTER — Ambulatory Visit (INDEPENDENT_AMBULATORY_CARE_PROVIDER_SITE_OTHER): Payer: Medicare Other | Admitting: Orthopedic Surgery

## 2014-10-28 VITALS — BP 161/93 | Ht 63.0 in | Wt 115.0 lb

## 2014-10-28 DIAGNOSIS — M75101 Unspecified rotator cuff tear or rupture of right shoulder, not specified as traumatic: Secondary | ICD-10-CM

## 2014-10-28 NOTE — Patient Instructions (Signed)
Joint Injection  Care After  Refer to this sheet in the next few days. These instructions provide you with information on caring for yourself after you have had a joint injection. Your caregiver also may give you more specific instructions. Your treatment has been planned according to current medical practices, but problems sometimes occur. Call your caregiver if you have any problems or questions after your procedure.  After any type of joint injection, it is not uncommon to experience:  · Soreness, swelling, or bruising around the injection site.  · Mild numbness, tingling, or weakness around the injection site caused by the numbing medicine used before or with the injection.  It also is possible to experience the following effects associated with the specific agent after injection:  · Iodine-based contrast agents:  ¨ Allergic reaction (itching, hives, widespread redness, and swelling beyond the injection site).  · Corticosteroids (These effects are rare.):  ¨ Allergic reaction.  ¨ Increased blood sugar levels (If you have diabetes and you notice that your blood sugar levels have increased, notify your caregiver).  ¨ Increased blood pressure levels.  ¨ Mood swings.  · Hyaluronic acid in the use of viscosupplementation.  ¨ Temporary heat or redness.  ¨ Temporary rash and itching.  ¨ Increased fluid accumulation in the injected joint.  These effects all should resolve within a day after your procedure.   HOME CARE INSTRUCTIONS  · Limit yourself to light activity the day of your procedure. Avoid lifting heavy objects, bending, stooping, or twisting.  · Take prescription or over-the-counter pain medication as directed by your caregiver.  · You may apply ice to your injection site to reduce pain and swelling the day of your procedure. Ice may be applied 03-04 times:  ¨ Put ice in a plastic bag.  ¨ Place a towel between your skin and the bag.  ¨ Leave the ice on for no longer than 15-20 minutes each time.  SEEK  IMMEDIATE MEDICAL CARE IF:   · Pain and swelling get worse rather than better or extend beyond the injection site.  · Numbness does not go away.  · Blood or fluid continues to leak from the injection site.  · You have chest pain.  · You have swelling of your face or tongue.  · You have trouble breathing or you become dizzy.  · You develop a fever, chills, or severe tenderness at the injection site that last longer than 1 day.  MAKE SURE YOU:  · Understand these instructions.  · Watch your condition.  · Get help right away if you are not doing well or if you get worse.  Document Released: 09/03/2010 Document Revised: 03/15/2011 Document Reviewed: 09/03/2010  Exit[employer]® Patient Information ©2015 [employer]. This information is not intended to replace advice given to you by your health care provider. Make sure you discuss any questions you have with your health care provider.

## 2014-10-28 NOTE — Progress Notes (Signed)
Separate problem in the fracture care.  Pain right shoulder  Patient proximal humerus fracture right treated with closed immobilization and therapy.  Presents with right shoulder pain status post one injection  Patient has decreased range of motion stiffness and pain in the right shoulder over the anterior humerus and anterior glenohumeral joint  Review of systems denies numbness or tingling. Still has some discomfort left wrist.  BP 161/93 mmHg  Ht 5\' 3"  (1.6 m)  Wt 115 lb (52.164 kg)  BMI 20.38 kg/m2 She is awake alert and oriented 3 she has major deformity in her thoracic spine chronic she has a large mass over the left cervical area which is also chronic and she says his already been worked up.  She has decreased range of motion and decreased strength in her left shoulder primarily rotator cuff supraspinatus. No instability neurovascular exam intact she has tenderness in the. Acromial region especially over the anterior shoulder joint and rotator interval.  I do not see or palpate any axillary or supraclavicular lymph nodes  Impression rotator cuff syndrome post proximal humerus fracture  Subacromial injection may repeat in 4 weeks if needed patient will call if still having pain  Already taking hydrocodone at night and tramadol during the day.  Procedure note the subacromial injection shoulder left   Verbal consent was obtained to inject the  Left   Shoulder  Timeout was completed to confirm the injection site is a subacromial space of the  left  shoulder  Medication used Depo-Medrol 40 mg and lidocaine 1% 3 cc  Anesthesia was provided by ethyl chloride  The injection was performed in the left  posterior subacromial space. After pinning the skin with alcohol and anesthetized the skin with ethyl chloride the subacromial space was injected using a 20-gauge needle. There were no complications  Sterile dressing was applied.

## 2014-11-21 ENCOUNTER — Ambulatory Visit: Payer: Medicare Other | Admitting: Orthopedic Surgery

## 2014-11-25 ENCOUNTER — Ambulatory Visit (INDEPENDENT_AMBULATORY_CARE_PROVIDER_SITE_OTHER): Payer: Medicare Other | Admitting: Orthopedic Surgery

## 2014-11-25 VITALS — BP 165/101 | Ht 63.0 in | Wt 115.0 lb

## 2014-11-25 DIAGNOSIS — S62102D Fracture of unspecified carpal bone, left wrist, subsequent encounter for fracture with routine healing: Secondary | ICD-10-CM

## 2014-11-25 DIAGNOSIS — M75101 Unspecified rotator cuff tear or rupture of right shoulder, not specified as traumatic: Secondary | ICD-10-CM | POA: Diagnosis not present

## 2014-11-25 NOTE — Patient Instructions (Addendum)
May remove wrist brace

## 2014-11-25 NOTE — Progress Notes (Signed)
Patient ID: [name], female   DOB: 1934-02-05, 80 y.o.   MRN: [id number]  Follow up visit  Chief Complaint  Patient presents with  . Follow-up    6 week recheck on left wrist fracture, DOI 08-29-14.    BP 165/101 mmHg  Ht 5\' 3"  (1.6 m)  Wt 115 lb (52.164 kg)  BMI 20.38 kg/m2  Left wrist is doing very well brace has been removed by me today range of motion no pain or tenderness  The patient complains of continued ongoing pain in the right shoulder. She also had a right proximal humerus fracture which we treated her for with closed treatment in physical therapy.

## 2014-11-25 NOTE — Progress Notes (Addendum)
Right shoulder pain within the postoperative post fracture care. Of the left wrist  Patient complains of pain in the right shoulder after for right proximal humerus fracture in physical therapy. She requests injection  June 2016 date of shoulder fracture  Right shoulder subacromial space injected   Procedure note the subacromial injection shoulder RIGHT  Verbal consent was obtained to inject the  RIGHT   Shoulder  Timeout was completed to confirm the injection site is a subacromial space of the  RIGHT  shoulder   Medication used Depo-Medrol 40 mg and lidocaine 1% 3 cc  Anesthesia was provided by ethyl chloride  The injection was performed in the RIGHT  posterior subacromial space. After pinning the skin with alcohol and anesthetized the skin with ethyl chloride the subacromial space was injected using a 20-gauge needle. There were no complications  Sterile dressing was applied.

## 2014-12-12 DIAGNOSIS — K219 Gastro-esophageal reflux disease without esophagitis: Secondary | ICD-10-CM | POA: Diagnosis not present

## 2014-12-12 DIAGNOSIS — M545 Low back pain: Secondary | ICD-10-CM | POA: Diagnosis not present

## 2014-12-12 DIAGNOSIS — J449 Chronic obstructive pulmonary disease, unspecified: Secondary | ICD-10-CM | POA: Diagnosis not present

## 2014-12-12 DIAGNOSIS — M810 Age-related osteoporosis without current pathological fracture: Secondary | ICD-10-CM | POA: Diagnosis not present

## 2015-01-14 ENCOUNTER — Telehealth: Payer: Self-pay | Admitting: *Deleted

## 2015-01-14 NOTE — Telephone Encounter (Signed)
Routing to Dr Harrison 

## 2015-01-14 NOTE — Telephone Encounter (Signed)
Patient called stating her left shoulder is really hurting her, and she would like something for it. Please advise [phone]. Patient uses [facility] in [place].

## 2015-01-14 NOTE — Telephone Encounter (Signed)
Use advil or tylenol   advil 800 mg tid or tylenol 500mg  2 tabs q 8h   Try this for 3 weeks before calling back

## 2015-01-15 NOTE — Telephone Encounter (Signed)
Patient called and I made her aware of Dr. Ruthe Mannan instructions

## 2015-03-13 DIAGNOSIS — J449 Chronic obstructive pulmonary disease, unspecified: Secondary | ICD-10-CM | POA: Diagnosis not present

## 2015-03-13 DIAGNOSIS — J8410 Pulmonary fibrosis, unspecified: Secondary | ICD-10-CM | POA: Diagnosis not present

## 2015-03-13 DIAGNOSIS — E46 Unspecified protein-calorie malnutrition: Secondary | ICD-10-CM | POA: Diagnosis not present

## 2015-03-13 DIAGNOSIS — M5440 Lumbago with sciatica, unspecified side: Secondary | ICD-10-CM | POA: Diagnosis not present

## 2015-06-13 DIAGNOSIS — N3281 Overactive bladder: Secondary | ICD-10-CM | POA: Diagnosis not present

## 2015-06-13 DIAGNOSIS — J8410 Pulmonary fibrosis, unspecified: Secondary | ICD-10-CM | POA: Diagnosis not present

## 2015-06-13 DIAGNOSIS — M5440 Lumbago with sciatica, unspecified side: Secondary | ICD-10-CM | POA: Diagnosis not present

## 2015-06-13 DIAGNOSIS — J449 Chronic obstructive pulmonary disease, unspecified: Secondary | ICD-10-CM | POA: Diagnosis not present

## 2015-07-23 DIAGNOSIS — J309 Allergic rhinitis, unspecified: Secondary | ICD-10-CM | POA: Diagnosis not present

## 2015-08-06 ENCOUNTER — Other Ambulatory Visit: Payer: Self-pay | Admitting: Gastroenterology

## 2015-10-15 DIAGNOSIS — E46 Unspecified protein-calorie malnutrition: Secondary | ICD-10-CM | POA: Diagnosis not present

## 2015-10-15 DIAGNOSIS — M545 Low back pain: Secondary | ICD-10-CM | POA: Diagnosis not present

## 2015-10-15 DIAGNOSIS — J449 Chronic obstructive pulmonary disease, unspecified: Secondary | ICD-10-CM | POA: Diagnosis not present

## 2015-10-15 DIAGNOSIS — Z23 Encounter for immunization: Secondary | ICD-10-CM | POA: Diagnosis not present

## 2015-10-15 DIAGNOSIS — M328 Other forms of systemic lupus erythematosus: Secondary | ICD-10-CM | POA: Diagnosis not present

## 2015-10-28 DIAGNOSIS — M818 Other osteoporosis without current pathological fracture: Secondary | ICD-10-CM | POA: Diagnosis not present

## 2015-12-25 DIAGNOSIS — F419 Anxiety disorder, unspecified: Secondary | ICD-10-CM | POA: Diagnosis not present

## 2015-12-25 DIAGNOSIS — M545 Low back pain: Secondary | ICD-10-CM | POA: Diagnosis not present

## 2015-12-25 DIAGNOSIS — E46 Unspecified protein-calorie malnutrition: Secondary | ICD-10-CM | POA: Diagnosis not present

## 2015-12-25 DIAGNOSIS — J449 Chronic obstructive pulmonary disease, unspecified: Secondary | ICD-10-CM | POA: Diagnosis not present

## 2016-02-05 DIAGNOSIS — J8410 Pulmonary fibrosis, unspecified: Secondary | ICD-10-CM | POA: Diagnosis not present

## 2016-02-05 DIAGNOSIS — M545 Low back pain: Secondary | ICD-10-CM | POA: Diagnosis not present

## 2016-02-05 DIAGNOSIS — E46 Unspecified protein-calorie malnutrition: Secondary | ICD-10-CM | POA: Diagnosis not present

## 2016-02-05 DIAGNOSIS — K210 Gastro-esophageal reflux disease with esophagitis: Secondary | ICD-10-CM | POA: Diagnosis not present

## 2016-02-07 ENCOUNTER — Other Ambulatory Visit: Payer: Self-pay | Admitting: Gastroenterology

## 2016-06-03 DIAGNOSIS — F419 Anxiety disorder, unspecified: Secondary | ICD-10-CM | POA: Diagnosis not present

## 2016-06-03 DIAGNOSIS — J449 Chronic obstructive pulmonary disease, unspecified: Secondary | ICD-10-CM | POA: Diagnosis not present

## 2016-06-03 DIAGNOSIS — J8410 Pulmonary fibrosis, unspecified: Secondary | ICD-10-CM | POA: Diagnosis not present

## 2016-06-03 DIAGNOSIS — E46 Unspecified protein-calorie malnutrition: Secondary | ICD-10-CM | POA: Diagnosis not present

## 2016-10-04 DIAGNOSIS — Z23 Encounter for immunization: Secondary | ICD-10-CM | POA: Diagnosis not present

## 2016-11-02 DIAGNOSIS — M545 Low back pain: Secondary | ICD-10-CM | POA: Diagnosis not present

## 2016-11-02 DIAGNOSIS — J449 Chronic obstructive pulmonary disease, unspecified: Secondary | ICD-10-CM | POA: Diagnosis not present

## 2016-11-02 DIAGNOSIS — K210 Gastro-esophageal reflux disease with esophagitis: Secondary | ICD-10-CM | POA: Diagnosis not present

## 2016-11-02 DIAGNOSIS — F419 Anxiety disorder, unspecified: Secondary | ICD-10-CM | POA: Diagnosis not present

## 2016-11-18 ENCOUNTER — Other Ambulatory Visit (HOSPITAL_COMMUNITY): Payer: Self-pay | Admitting: Pulmonary Disease

## 2016-11-18 ENCOUNTER — Ambulatory Visit (HOSPITAL_COMMUNITY)
Admission: RE | Admit: 2016-11-18 | Discharge: 2016-11-18 | Disposition: A | Discharge status: Home / Self Care | Payer: Medicare Other | Source: Ambulatory Visit | Attending: Pulmonary Disease | Admitting: Pulmonary Disease

## 2016-11-18 DIAGNOSIS — M545 Low back pain: Secondary | ICD-10-CM | POA: Insufficient documentation

## 2016-12-22 ENCOUNTER — Other Ambulatory Visit: Payer: Self-pay | Admitting: Gastroenterology

## 2016-12-22 NOTE — Telephone Encounter (Signed)
Noted  

## 2016-12-22 NOTE — Telephone Encounter (Signed)
Patient last seen in 05/2014.   She needs ov for future refills. Refilled for 60 days only.

## 2017-03-11 ENCOUNTER — Ambulatory Visit (HOSPITAL_COMMUNITY)
Admission: RE | Admit: 2017-03-11 | Discharge: 2017-03-11 | Disposition: A | Discharge status: Home / Self Care | Payer: Medicare Other | Source: Ambulatory Visit | Attending: Pulmonary Disease | Admitting: Pulmonary Disease

## 2017-03-11 ENCOUNTER — Other Ambulatory Visit (HOSPITAL_COMMUNITY): Payer: Self-pay | Admitting: Pulmonary Disease

## 2017-03-11 DIAGNOSIS — M25521 Pain in right elbow: Secondary | ICD-10-CM | POA: Insufficient documentation

## 2017-03-11 DIAGNOSIS — M549 Dorsalgia, unspecified: Secondary | ICD-10-CM

## 2017-03-11 DIAGNOSIS — J449 Chronic obstructive pulmonary disease, unspecified: Secondary | ICD-10-CM | POA: Diagnosis not present

## 2017-03-11 DIAGNOSIS — E46 Unspecified protein-calorie malnutrition: Secondary | ICD-10-CM | POA: Diagnosis not present

## 2017-03-11 DIAGNOSIS — M4854XA Collapsed vertebra, not elsewhere classified, thoracic region, initial encounter for fracture: Secondary | ICD-10-CM | POA: Diagnosis not present

## 2017-03-11 DIAGNOSIS — M545 Low back pain: Secondary | ICD-10-CM | POA: Diagnosis not present

## 2017-03-11 DIAGNOSIS — S22060A Wedge compression fracture of T7-T8 vertebra, initial encounter for closed fracture: Secondary | ICD-10-CM | POA: Diagnosis not present

## 2017-03-17 ENCOUNTER — Encounter: Payer: Self-pay | Admitting: Pulmonary Disease

## 2017-03-17 DIAGNOSIS — Z79891 Long term (current) use of opiate analgesic: Secondary | ICD-10-CM | POA: Diagnosis not present

## 2017-03-29 ENCOUNTER — Other Ambulatory Visit: Payer: Self-pay | Admitting: Gastroenterology

## 2017-04-13 DIAGNOSIS — M549 Dorsalgia, unspecified: Secondary | ICD-10-CM | POA: Diagnosis not present

## 2017-04-13 DIAGNOSIS — J449 Chronic obstructive pulmonary disease, unspecified: Secondary | ICD-10-CM | POA: Diagnosis not present

## 2017-04-13 DIAGNOSIS — R079 Chest pain, unspecified: Secondary | ICD-10-CM | POA: Diagnosis not present

## 2017-04-13 DIAGNOSIS — M4850XD Collapsed vertebra, not elsewhere classified, site unspecified, subsequent encounter for fracture with routine healing: Secondary | ICD-10-CM | POA: Diagnosis not present

## 2017-04-15 ENCOUNTER — Other Ambulatory Visit (HOSPITAL_COMMUNITY): Payer: Self-pay | Admitting: Pulmonary Disease

## 2017-04-15 ENCOUNTER — Ambulatory Visit (HOSPITAL_COMMUNITY)
Admission: RE | Admit: 2017-04-15 | Discharge: 2017-04-15 | Disposition: A | Discharge status: Home / Self Care | Payer: Medicare Other | Source: Ambulatory Visit | Attending: Pulmonary Disease | Admitting: Pulmonary Disease

## 2017-04-15 DIAGNOSIS — Q2546 Tortuous aortic arch: Secondary | ICD-10-CM | POA: Diagnosis not present

## 2017-04-15 DIAGNOSIS — R079 Chest pain, unspecified: Secondary | ICD-10-CM

## 2017-05-16 ENCOUNTER — Encounter: Payer: Self-pay | Admitting: Pulmonary Disease

## 2017-05-16 DIAGNOSIS — K219 Gastro-esophageal reflux disease without esophagitis: Secondary | ICD-10-CM | POA: Diagnosis not present

## 2017-05-16 DIAGNOSIS — E46 Unspecified protein-calorie malnutrition: Secondary | ICD-10-CM | POA: Diagnosis not present

## 2017-05-16 DIAGNOSIS — J449 Chronic obstructive pulmonary disease, unspecified: Secondary | ICD-10-CM | POA: Diagnosis not present

## 2017-05-16 DIAGNOSIS — M4850XD Collapsed vertebra, not elsewhere classified, site unspecified, subsequent encounter for fracture with routine healing: Secondary | ICD-10-CM | POA: Diagnosis not present

## 2017-05-16 LAB — BASIC METABOLIC PANEL
BUN: 9 (ref 4–21)
Creatinine: 0.8 (ref <=1.1)
Glucose: 109
Sodium: 137 (ref 137–147)

## 2017-05-16 LAB — TSH: TSH: 3.26 (ref <=5.90)

## 2017-05-16 LAB — LIPID PANEL
Cholesterol: 261 — AB (ref 0–200)
HDL: 90 — AB (ref 35–70)
LDL Cholesterol: 150
Triglycerides: 101 (ref 40–160)

## 2017-05-20 ENCOUNTER — Encounter: Payer: Self-pay | Admitting: Cardiology

## 2017-06-24 DIAGNOSIS — R079 Chest pain, unspecified: Secondary | ICD-10-CM | POA: Diagnosis not present

## 2017-06-24 DIAGNOSIS — J449 Chronic obstructive pulmonary disease, unspecified: Secondary | ICD-10-CM | POA: Diagnosis not present

## 2017-06-24 DIAGNOSIS — M5440 Lumbago with sciatica, unspecified side: Secondary | ICD-10-CM | POA: Diagnosis not present

## 2017-06-24 DIAGNOSIS — M810 Age-related osteoporosis without current pathological fracture: Secondary | ICD-10-CM | POA: Diagnosis not present

## 2017-07-01 ENCOUNTER — Encounter

## 2017-07-01 ENCOUNTER — Encounter: Payer: Self-pay | Admitting: *Deleted

## 2017-07-01 ENCOUNTER — Ambulatory Visit: Payer: Medicare Other | Admitting: Cardiovascular Disease

## 2017-08-08 ENCOUNTER — Other Ambulatory Visit (HOSPITAL_COMMUNITY): Payer: Self-pay | Admitting: Pulmonary Disease

## 2017-08-08 DIAGNOSIS — R609 Edema, unspecified: Secondary | ICD-10-CM

## 2017-08-08 DIAGNOSIS — F419 Anxiety disorder, unspecified: Secondary | ICD-10-CM | POA: Diagnosis not present

## 2017-08-08 DIAGNOSIS — J449 Chronic obstructive pulmonary disease, unspecified: Secondary | ICD-10-CM | POA: Diagnosis not present

## 2017-08-08 DIAGNOSIS — M5440 Lumbago with sciatica, unspecified side: Secondary | ICD-10-CM | POA: Diagnosis not present

## 2017-08-10 DIAGNOSIS — H2513 Age-related nuclear cataract, bilateral: Secondary | ICD-10-CM | POA: Diagnosis not present

## 2017-08-11 ENCOUNTER — Ambulatory Visit (HOSPITAL_COMMUNITY)
Admission: RE | Admit: 2017-08-11 | Discharge: 2017-08-11 | Disposition: A | Discharge status: Home / Self Care | Payer: Medicare Other | Source: Ambulatory Visit | Attending: Pulmonary Disease | Admitting: Pulmonary Disease

## 2017-08-11 DIAGNOSIS — E785 Hyperlipidemia, unspecified: Secondary | ICD-10-CM | POA: Insufficient documentation

## 2017-08-11 DIAGNOSIS — R609 Edema, unspecified: Secondary | ICD-10-CM | POA: Diagnosis not present

## 2017-08-11 DIAGNOSIS — I371 Nonrheumatic pulmonary valve insufficiency: Secondary | ICD-10-CM | POA: Insufficient documentation

## 2017-08-11 NOTE — Progress Notes (Signed)
*  PRELIMINARY RESULTS* Echocardiogram 2D Echocardiogram has been performed.  Samuel Germany 08/11/2017, 11:09 AM

## 2017-08-19 ENCOUNTER — Ambulatory Visit: Payer: Medicare Other | Admitting: Cardiology

## 2017-08-22 ENCOUNTER — Encounter: Payer: Self-pay | Admitting: Cardiology

## 2017-08-22 NOTE — Progress Notes (Signed)
Cardiology Office Note  Date: 08/23/2017   ID: Lacey [name] [name], DOB 1934/11/19, MRN [id number]  PCP: Lacey [name] Du, MD  Consulting Cardiologist: Lacey [name] Lesches, MD   Chief Complaint  [name] presents with  . Shortness of Breath    History of Present Illness: Lacey [name] [name] is an 83 y.o. female referred for cardiology consultation by Dr. Luan [name] for Lacey [name] evaluation of shortness of breath.  She is here with her daughter who works as a Lacey [name] [name].  Over Lacey [name] last year Lacey [name] [name] has experienced intermittently more pronounced dyspnea on exertion and sometimes swelling in her feet and lower legs.  Her husband passed away about 1 year ago, this has also impacted her significantly.  She does not report any chest pain with exertion or palpitations, some days she says that she has "good days" and is not significantly limited by shortness of breath.  She also mentions that nebulizer treatments have been helpful.  She had a recent echocardiogram obtained by Dr. Luan [name] which demonstrated LVEF 60 to 65%, PASP 32 mmHg.  I personally reviewed her ECG today which shows a sinus rhythm with R' in lead V1 and V2.  Today we discussed Lacey [name] results of her recent ECG and echocardiogram, both reassuring.  We also went over other testing possibilities for investigation of potential underlying ischemic heart disease.  After talking with Lacey [name] [name] and her daughter in detail, plan is to continue with observation for now.   Past Medical History:  Diagnosis Date  . Anxiety   . COPD (chronic obstructive pulmonary disease) ([place])   . GERD (gastroesophageal reflux disease)   . Hyperlipidemia   . IBS (irritable bowel syndrome)   . Osteoporosis     Past Surgical History:  Procedure Laterality Date  . COLONOSCOPY  10/2009   sigmoid diverticula, multiple tubulovillous adneomas, needs surveillance Oct 2013  . ESOPHAGOGASTRODUODENOSCOPY  04/16/2011   Dr. Gala Romney: Noncritical Schatzkis ring( not manipulated because no  dysphagia). Normal esophagus otherwise  large hiatal hernia. Gastric polyp-status post biopsy. Gastric erosions-staus post biopsy. Abnormal bulb-status post biopsy. benign small bowel, stomach biopsy with ulcerated gastric antral mucosa with foveolar hyperplasia and surface erosion, polyp with inflamed gastric antral mucosa   . ESOPHAGOGASTRODUODENOSCOPY N/A 04/29/2014   Dr. Gala Romney: Noncritical Schatzki's ring large hiatal hernia. Retained gastric contents.GES with slight delay    Current Outpatient Medications  Medication Sig Dispense Refill  . albuterol (PROVENTIL) (2.5 MG/3ML) 0.083% nebulizer solution Take 2.5 mg by nebulization every 6 (six) hours as needed for wheezing or shortness of breath.    . Albuterol Sulfate (PROAIR HFA IN) Inhale 2 puffs into Lacey [name] lungs every 4 (four) hours as needed (4-6 hours).    . ALPRAZolam (XANAX) 1 MG tablet Take 1 mg by mouth 3 (three) times daily as needed for anxiety.     . cetirizine (ZYRTEC) 10 MG tablet Take 10 mg by mouth daily.    . furosemide (LASIX) 40 MG tablet Take 40 mg by mouth daily.    . pantoprazole (PROTONIX) 40 MG tablet TAKE (1) TABLET BY MOUTH TWICE A DAY WITH MEALS (BREAKFAST AND SUPPER) 60 tablet 3  . potassium chloride (K-DUR) 10 MEQ tablet Take 10 mEq by mouth daily.    . VOLTAREN 1 % GEL Apply 1 g topically daily as needed. pain    . zolpidem (AMBIEN) 10 MG tablet Take 10 mg by mouth at bedtime as needed for sleep.     No current facility-administered medications for this visit.  Allergies:  Codeine; Codeine; Levofloxacin; Sulfonamide derivatives; and Sulfa antibiotics   Social History: Lacey [name] [name]  reports that she has never smoked. Her smokeless tobacco use includes snuff. She reports that she does not drink alcohol or use drugs.   Family History: Lacey [name] [name]'s family history includes Diabetes in her mother; Heart disease in her father; Stroke in her mother.   ROS:  Please see Lacey [name] history of present illness. Otherwise, complete  review of systems is positive for hearing loss, arthritic stiffness in her hands.  All other systems are reviewed and negative.   Physical Exam: VS:  BP 121/82   Pulse 94   Ht 5\' 3"  (1.6 m)   Wt 113 lb 6.4 oz (51.4 kg)   SpO2 97%   BMI 20.09 kg/m , BMI Body mass index is 20.09 kg/m.  Wt Readings from Last 3 Encounters:  08/23/17 113 lb 6.4 oz (51.4 kg)  11/25/14 115 lb (52.2 kg)  10/28/14 115 lb (52.2 kg)    General: Frail appearing elderly woman, no distress. HEENT: Conjunctiva and lids normal, oropharynx clear with poor dentition. Neck: Supple, no elevated JVP or carotid bruits, no thyromegaly. Lungs: Decreased breath sounds without wheezing, nonlabored breathing at rest. Cardiac: Regular rate and rhythm, no S3 or significant systolic murmur. Abdomen: Soft, nontender, bowel sounds present. Extremities: Trace ankle edema, distal pulses 2+. Skin: Warm and dry. Musculoskeletal: No kyphosis. Neuropsychiatric: Alert and oriented x3, affect grossly appropriate.  ECG: I personally reviewed Lacey [name] tracing from 05/28/2009 which showed sinus rhythm.  Recent Labwork:  May 2019: Cholesterol 261, HDL 90, triglycerides 101, LDL 150, BUN 9, creatinine 0.79, potassium 4.9, AST 18, ALT 10, TSH 3.26, hemoglobin 12.3, platelets 212  Other Studies Reviewed Today:  Echocardiogram 08/11/2017: Study Conclusions  - Left ventricle: Proximal septal thickening with sigmoid septum.   Lacey [name] cavity size was normal. Wall thickness was normal. Systolic   function was normal. Lacey [name] estimated ejection fraction was in Lacey [name]   range of 60% to 65%. Lacey [name] study is not technically sufficient to   allow evaluation of LV diastolic function. - Pulmonary arteries: PA peak pressure: 32 mm Hg (S).  Chest x-ray 04/15/2017: FINDINGS: There is no edema or consolidation. Lacey [name] heart size and pulmonary vascularity are normal. Aorta is tortuous but stable. There is degenerative change in Lacey [name] thoracic spine. There is anterior  wedging of several midthoracic vertebral bodies with increase in kyphosis. There is old trauma involving Lacey [name] lateral right clavicle.  IMPRESSION: No edema or consolidation. Stable aortic tortuosity. Heart size within normal limits.  Assessment and Plan:  1.  Dyspnea on exertion, intermittent ankle and lower leg swelling.  LVEF is normal at 60 to 65%.  This has been going on to varying degrees over Lacey [name] last year.  She does not report any exertional chest pain, and states that some days she does quite well in terms of her breathing. She also mentions improvement in symptoms with nebulizer treatments.  Suspect that symptoms are multifactorial particularly in light of COPD.  She could certainly have underlying ischemic heart disease, and we did discuss Lacey [name] possibility of further noninvasive evaluation such as a Lexiscan Myoview to obtain further information.  After discussion today with Lacey [name] [name] and her daughter, plan is to hold off and continue with observation for now unless symptoms progress.  2.  COPD with history of hypoxic respiratory failure.  Continues to follow with Dr. Luan [name].  3.  Mixed hyperlipidemia by history.   Current medicines were reviewed with Lacey [name]  [name] today.   Orders Placed This Encounter  Procedures  . EKG 12-Lead    Disposition: Follow-up in 6 months in Lacey [name] [place] office.  Signed, Satira Sark, MD, [facility] 08/23/2017 1:51 PM    [facility], [place], [place] [address] Phone: [phone]; Fax: [phone]

## 2017-08-23 ENCOUNTER — Encounter: Payer: Self-pay | Admitting: Cardiology

## 2017-08-23 ENCOUNTER — Ambulatory Visit (INDEPENDENT_AMBULATORY_CARE_PROVIDER_SITE_OTHER): Payer: Medicare Other | Admitting: Cardiology

## 2017-08-23 VITALS — BP 121/82 | HR 94 | Ht 63.0 in | Wt 113.4 lb

## 2017-08-23 DIAGNOSIS — R0602 Shortness of breath: Secondary | ICD-10-CM | POA: Diagnosis not present

## 2017-08-23 DIAGNOSIS — J449 Chronic obstructive pulmonary disease, unspecified: Secondary | ICD-10-CM

## 2017-08-23 DIAGNOSIS — E782 Mixed hyperlipidemia: Secondary | ICD-10-CM | POA: Diagnosis not present

## 2017-08-23 NOTE — Patient Instructions (Signed)

## 2017-09-21 DIAGNOSIS — M545 Low back pain: Secondary | ICD-10-CM | POA: Diagnosis not present

## 2017-09-21 DIAGNOSIS — Z23 Encounter for immunization: Secondary | ICD-10-CM | POA: Diagnosis not present

## 2017-09-21 DIAGNOSIS — J449 Chronic obstructive pulmonary disease, unspecified: Secondary | ICD-10-CM | POA: Diagnosis not present

## 2017-09-21 DIAGNOSIS — F419 Anxiety disorder, unspecified: Secondary | ICD-10-CM | POA: Diagnosis not present

## 2017-09-21 DIAGNOSIS — M79643 Pain in unspecified hand: Secondary | ICD-10-CM | POA: Diagnosis not present

## 2017-10-03 ENCOUNTER — Other Ambulatory Visit: Payer: Self-pay | Admitting: Gastroenterology

## 2017-10-07 ENCOUNTER — Ambulatory Visit: Payer: Medicare Other | Admitting: Cardiology

## 2017-12-21 DIAGNOSIS — F419 Anxiety disorder, unspecified: Secondary | ICD-10-CM | POA: Diagnosis not present

## 2017-12-21 DIAGNOSIS — J449 Chronic obstructive pulmonary disease, unspecified: Secondary | ICD-10-CM | POA: Diagnosis not present

## 2017-12-21 DIAGNOSIS — M545 Low back pain: Secondary | ICD-10-CM | POA: Diagnosis not present

## 2017-12-21 DIAGNOSIS — R079 Chest pain, unspecified: Secondary | ICD-10-CM | POA: Diagnosis not present

## 2018-02-17 ENCOUNTER — Other Ambulatory Visit: Payer: Self-pay

## 2018-02-17 ENCOUNTER — Emergency Department (HOSPITAL_COMMUNITY)
Admission: EM | Admit: 2018-02-17 | Discharge: 2018-02-17 | Disposition: A | Discharge status: Home / Self Care | Payer: Medicare Other | Attending: Emergency Medicine | Admitting: Emergency Medicine

## 2018-02-17 ENCOUNTER — Emergency Department (HOSPITAL_COMMUNITY): Payer: Medicare Other

## 2018-02-17 ENCOUNTER — Encounter (HOSPITAL_COMMUNITY): Payer: Self-pay | Admitting: Emergency Medicine

## 2018-02-17 DIAGNOSIS — Z79899 Other long term (current) drug therapy: Secondary | ICD-10-CM | POA: Insufficient documentation

## 2018-02-17 DIAGNOSIS — R918 Other nonspecific abnormal finding of lung field: Secondary | ICD-10-CM | POA: Diagnosis not present

## 2018-02-17 DIAGNOSIS — R0609 Other forms of dyspnea: Secondary | ICD-10-CM | POA: Diagnosis not present

## 2018-02-17 DIAGNOSIS — J80 Acute respiratory distress syndrome: Secondary | ICD-10-CM | POA: Diagnosis not present

## 2018-02-17 DIAGNOSIS — J441 Chronic obstructive pulmonary disease with (acute) exacerbation: Secondary | ICD-10-CM | POA: Insufficient documentation

## 2018-02-17 DIAGNOSIS — R0602 Shortness of breath: Secondary | ICD-10-CM | POA: Diagnosis not present

## 2018-02-17 LAB — CBC WITH DIFFERENTIAL/PLATELET
Abs Immature Granulocytes: 0.02 10*3/uL (ref 0.00–0.07)
BASOS PCT: 0 %
Basophils Absolute: 0 10*3/uL (ref 0.0–0.1)
Eosinophils Absolute: 0.2 10*3/uL (ref 0.0–0.5)
Eosinophils Relative: 2 %
HCT: 37.6 % (ref 36.0–46.0)
Hemoglobin: 11.2 g/dL — ABNORMAL LOW (ref 12.0–15.0)
Immature Granulocytes: 0 %
Lymphocytes Relative: 10 %
Lymphs Abs: 1 10*3/uL (ref 0.7–4.0)
MCH: 26.2 pg (ref 26.0–34.0)
MCHC: 29.8 g/dL — AB (ref 30.0–36.0)
MCV: 88.1 fL (ref 80.0–100.0)
MONO ABS: 0.6 10*3/uL (ref 0.1–1.0)
Monocytes Relative: 6 %
Neutro Abs: 8.1 10*3/uL — ABNORMAL HIGH (ref 1.7–7.7)
Neutrophils Relative %: 82 %
Platelets: 293 10*3/uL (ref 150–400)
RBC: 4.27 MIL/uL (ref 3.87–5.11)
RDW: 13.8 % (ref 11.5–15.5)
WBC: 9.5 10*3/uL (ref 4.0–10.5)
nRBC: 0 % (ref 0.0–0.2)

## 2018-02-17 LAB — COMPREHENSIVE METABOLIC PANEL
ALT: 14 U/L (ref 0–44)
AST: 20 U/L (ref 15–41)
Albumin: 3.6 g/dL (ref 3.5–5.0)
Alkaline Phosphatase: 78 U/L (ref 38–126)
Anion gap: 9 (ref 5–15)
BUN: 12 mg/dL (ref 8–23)
CO2: 24 mmol/L (ref 22–32)
Calcium: 8.9 mg/dL (ref 8.9–10.3)
Chloride: 105 mmol/L (ref 98–111)
Creatinine, Ser: 0.77 mg/dL (ref 0.44–1.00)
GFR calc Af Amer: >60 mL/min (ref >60)
Glucose, Bld: 121 mg/dL — ABNORMAL HIGH (ref 70–99)
Potassium: 4.4 mmol/L (ref 3.5–5.1)
Sodium: 138 mmol/L (ref 135–145)
Total Bilirubin: 0.5 mg/dL (ref 0.3–1.2)
Total Protein: 7.3 g/dL (ref 6.5–8.1)

## 2018-02-17 LAB — TROPONIN I
Troponin I: <0.03 ng/mL (ref <0.03)
Troponin I: <0.03 ng/mL (ref <0.03)

## 2018-02-17 LAB — D-DIMER, QUANTITATIVE: D-Dimer, Quant: 4.05 ug/mL-FEU — ABNORMAL HIGH (ref 0.00–0.50)

## 2018-02-17 MED ORDER — PREDNISONE 10 MG PO TABS: 20.0000 mg | ORAL_TABLET | Freq: Every day | ORAL | 0 refills | Status: DC

## 2018-02-17 MED ORDER — IPRATROPIUM BROMIDE HFA 17 MCG/ACT IN AERS: 2.0000 | INHALATION_SPRAY | RESPIRATORY_TRACT | 12 refills | Status: DC | PRN

## 2018-02-17 MED ORDER — IOPAMIDOL (ISOVUE-370) INJECTION 76%
100.0000 mL | Freq: Once | INTRAVENOUS | Status: AC | PRN
  Administered 2018-02-17: 100 mL via INTRAVENOUS

## 2018-02-17 MED ORDER — PREDNISONE 50 MG PO TABS
60.0000 mg | ORAL_TABLET | Freq: Once | ORAL | Status: AC
  Administered 2018-02-17: 60 mg via ORAL
  Filled 2018-02-17: qty 1

## 2018-02-17 NOTE — ED Notes (Signed)
Patient ambulated with no complaints of shortness of breath. Patient sats on room air were 98 to 100 percent.

## 2018-02-17 NOTE — Discharge Instructions (Addendum)
Follow-up with Dr. Luan Pulling next week

## 2018-02-17 NOTE — ED Triage Notes (Signed)
Comes in by EMS, SOB x 4 days, talking to Dr Luan Pulling over the phone for treatment, called in antibiotic and steroids. States she can not even walk in the kitchen without being SOB. Lives with son.

## 2018-02-17 NOTE — ED Notes (Signed)
Patient transported to CT 

## 2018-02-17 NOTE — ED Notes (Signed)
Patient given scrub pants for discharge due to her clothes are wet.

## 2018-02-17 NOTE — ED Notes (Signed)
Purewick placed on pt. 

## 2018-02-17 NOTE — ED Provider Notes (Signed)
[facility] EMERGENCY DEPARTMENT Provider Note   CSN: [id number] Arrival date & time: 02/17/18  1627     History   Chief Complaint Chief Complaint  Patient presents with  . Shortness of Breath    HPI [name] is a 84 y.o. female.  Patient brought in for shortness of breath when she exerts herself.  She has COPD  The history is provided by the patient. No language interpreter was used.  Shortness of Breath  Severity:  Moderate Onset quality:  Sudden Timing:  Constant Progression:  Worsening Chronicity:  Recurrent Context: activity   Relieved by:  Nothing Worsened by:  Nothing Ineffective treatments:  None tried Associated symptoms: no abdominal pain, no chest pain, no cough, no headaches and no rash     Past Medical History:  Diagnosis Date  . Anxiety   . COPD (chronic obstructive pulmonary disease) ([place])   . GERD (gastroesophageal reflux disease)   . Hyperlipidemia   . IBS (irritable bowel syndrome)   . Osteoporosis     Patient Active Problem List   Diagnosis Date Noted  . Hiatal hernia   . Dyspepsia 04/04/2014  . Nausea 04/30/2011  . Hematochezia 04/30/2011  . RUQ pain 03/25/2011  . Adenomatous polyps 03/25/2011  . ABDOMINAL PAIN, LEFT LOWER QUADRANT 10/16/2009  . HYPERLIPIDEMIA 10/09/2009  . BRONCHITIS 10/09/2009  . OSTEOPOROSIS 10/09/2009    Past Surgical History:  Procedure Laterality Date  . COLONOSCOPY  10/2009   sigmoid diverticula, multiple tubulovillous adneomas, needs surveillance Oct 2013  . ESOPHAGOGASTRODUODENOSCOPY  04/16/2011   Dr. Gala Romney: Noncritical Schatzkis ring( not manipulated because no dysphagia). Normal esophagus otherwise  large hiatal hernia. Gastric polyp-status post biopsy. Gastric erosions-staus post biopsy. Abnormal bulb-status post biopsy. benign small bowel, stomach biopsy with ulcerated gastric antral mucosa with foveolar hyperplasia and surface erosion, polyp with inflamed gastric antral mucosa   .  ESOPHAGOGASTRODUODENOSCOPY N/A 04/29/2014   Dr. Gala Romney: Noncritical Schatzki's ring large hiatal hernia. Retained gastric contents.GES with slight delay     OB History    Gravida  3   Para  3   Term  3   Preterm  0   AB  0   Living        SAB  0   TAB  0   Ectopic  0   Multiple      Live Births               Home Medications    Prior to Admission medications   Medication Sig Start Date End Date Taking? Authorizing Provider  albuterol (PROVENTIL) (2.5 MG/3ML) 0.083% nebulizer solution Take 2.5 mg by nebulization every 6 (six) hours as needed for wheezing or shortness of breath.   Yes [provider]  Albuterol Sulfate (PROAIR HFA IN) Inhale 2 puffs into the lungs every 4 (four) hours as needed (4-6 hours).   Yes [provider]  ALPRAZolam Duanne Moron) 1 MG tablet Take 1 mg by mouth 3 (three) times daily as needed for anxiety.  03/27/14  Yes [provider]  cetirizine (ZYRTEC) 10 MG tablet Take 10 mg by mouth daily.   Yes [provider]  furosemide (LASIX) 40 MG tablet Take 40 mg by mouth daily.   Yes [provider]  pantoprazole (PROTONIX) 40 MG tablet TAKE (1) TABLET BY MOUTH TWICE A DAY WITH MEALS (BREAKFAST AND SUPPER) 10/04/17  Yes Annitta Needs, NP  VOLTAREN 1 % GEL Apply 1 g topically daily as needed. pain 07/01/14  Yes [provider]  zolpidem (AMBIEN) 10 MG tablet Take 10 mg by mouth at bedtime as needed for sleep.   Yes [provider]  ipratropium (ATROVENT HFA) 17 MCG/ACT inhaler Inhale 2 puffs into the lungs every 4 (four) hours as needed for wheezing. 02/17/18   Milton Ferguson, MD  potassium chloride (K-DUR) 10 MEQ tablet Take 10 mEq by mouth daily.    [provider]  predniSONE (DELTASONE) 10 MG tablet Take 2 tablets (20 mg total) by mouth daily. 02/17/18   Milton Ferguson, MD    Family History Family History  Problem Relation Age of Onset  . Stroke Mother   . Diabetes Mother   .  Heart disease Father   . Colon cancer Neg Hx     Social History Social History   Tobacco Use  . Smoking status: Never Smoker  . Smokeless tobacco: Current User    Types: Snuff  Substance Use Topics  . Alcohol use: No  . Drug use: No     Allergies   Codeine; Codeine; Levofloxacin; Sulfonamide derivatives; and Sulfa antibiotics   Review of Systems Review of Systems  Constitutional: Negative for appetite change and fatigue.  HENT: Negative for congestion, ear discharge and sinus pressure.   Eyes: Negative for discharge.  Respiratory: Positive for shortness of breath. Negative for cough.   Cardiovascular: Negative for chest pain.  Gastrointestinal: Negative for abdominal pain and diarrhea.  Genitourinary: Negative for frequency and hematuria.  Musculoskeletal: Negative for back pain.  Skin: Negative for rash.  Neurological: Negative for seizures and headaches.  Psychiatric/Behavioral: Negative for hallucinations.     Physical Exam Updated Vital Signs BP 136/87   Pulse 77   Temp 97.9 F (36.6 C) (Oral)   Resp 19   Ht 5\' 5"  (1.651 m)   Wt 51.3 kg   SpO2 100%   BMI 18.80 kg/m   Physical Exam Vitals signs and nursing note reviewed.  Constitutional:      Appearance: She is well-developed.  HENT:     Head: Normocephalic.     Nose: Nose normal.  Eyes:     General: No scleral icterus.    Conjunctiva/sclera: Conjunctivae normal.  Neck:     Musculoskeletal: Neck supple.     Thyroid: No thyromegaly.  Cardiovascular:     Rate and Rhythm: Normal rate and regular rhythm.     Heart sounds: No murmur. No friction rub. No gallop.   Pulmonary:     Breath sounds: No stridor. No wheezing or rales.  Chest:     Chest wall: No tenderness.  Abdominal:     General: There is no distension.     Tenderness: There is no abdominal tenderness. There is no rebound.  Musculoskeletal: Normal range of motion.  Lymphadenopathy:     Cervical: No cervical adenopathy.  Skin:     Findings: No erythema or rash.  Neurological:     Mental Status: She is alert and oriented to person, place, and time.     Motor: No abnormal muscle tone.     Coordination: Coordination normal.  Psychiatric:        Behavior: Behavior normal.      ED Treatments / Results  Labs (all labs ordered are listed, but only abnormal results are displayed) Labs Reviewed  CBC WITH DIFFERENTIAL/PLATELET - Abnormal; Notable for the following components:      Result Value   Hemoglobin 11.2 (*)    MCHC 29.8 (*)    Neutro Abs 8.1 (*)  All other components within normal limits  COMPREHENSIVE METABOLIC PANEL - Abnormal; Notable for the following components:   Glucose, Bld 121 (*)    All other components within normal limits  D-DIMER, QUANTITATIVE (NOT AT [facility]) - Abnormal; Notable for the following components:   D-Dimer, Quant 4.05 (*)    All other components within normal limits  TROPONIN I  TROPONIN I    EKG EKG Interpretation  Date/Time:  Friday February 17 2018 16:33:03 EST Ventricular Rate:  74 PR Interval:    QRS Duration: 100 QT Interval:  414 QTC Calculation: 460 R Axis:   70 Text Interpretation:  Sinus rhythm Abnormal R-wave progression, early transition Baseline wander in lead(s) V3 Confirmed by Milton Ferguson [phone]) on 02/17/2018 8:15:46 PM   Radiology Dg Chest 2 View  Result Date: 02/17/2018 CLINICAL DATA:  Dyspnea x4 days with nonproductive cough, worse with exertion. EXAM: CHEST - 2 VIEW COMPARISON:  04/15/2017 FINDINGS: Stable cardiomegaly with tortuous atherosclerotic aorta. Moderate-sized hiatal hernia superimposed upon the cardiac silhouette is redemonstrated. Emphysematous hyperinflation of the lungs with attenuated pulmonary vasculature is again noted. Slightly more confluent opacities in the right mid lung may represent areas of atelectasis and/or pneumonia. Superimposed costochondral calcifications may also account for some of this appearance. No significant  effusion. No pneumothorax. Healed fracture deformity of the distal right clavicle. Osteoarthritis of the acromioclavicular and glenohumeral joints. Severe kyphosis of the lower thoracic spine attributable to multilevel disc space narrowing and compression fractures. IMPRESSION: COPD.  Tortuous atherosclerotic aorta. Right mid lung opacities are felt secondary to overlapping costochondral calcifications. Minimal areas of pneumonia and/or atelectasis are among some differential possibilities. Stable midthoracic kyphosis. Electronically Signed   By: Ashley Royalty M.D.   On: 02/17/2018 18:08   Ct Angio Chest Pe W And/or Wo Contrast  Result Date: 02/17/2018 CLINICAL DATA:  Increased dyspnea with exertion EXAM: CT ANGIOGRAPHY CHEST WITH CONTRAST TECHNIQUE: Multidetector CT imaging of the chest was performed using the standard protocol during bolus administration of intravenous contrast. Multiplanar CT image reconstructions and MIPs were obtained to evaluate the vascular anatomy. CONTRAST:  136mL ISOVUE-370 IOPAMIDOL (ISOVUE-370) INJECTION 76% COMPARISON:  Same day chest radiograph FINDINGS: Cardiovascular: Normal heart size without pericardial effusion. Conventional branch pattern of the great vessels without aneurysm or dissection. Nonaneurysmal ectatic thoracic aorta. No acute pulmonary embolus. Coronary arteriosclerosis is noted. Mediastinum/Nodes: No enlarged mediastinal, hilar, or axillary lymph nodes. Thyroid gland, trachea, and esophagus demonstrate no significant findings. Moderate-sized hiatal hernia is seen. Lungs/Pleura: Subpleural fibrosis is noted within the right middle lobe, lingula and both lower lobes. No effusion or pneumothorax. No dominant mass or pulmonary consolidations. Biapical pleuroparenchymal scarring is noted. Upper Abdomen: No acute abnormality. Parapelvic renal cysts are suggested of the included left kidney. Musculoskeletal: Midthoracic kyphosis attributable to multilevel anterior  compression fractures. Aggressive or suspicious osseous abnormalities. Review of the MIP images confirms the above findings. IMPRESSION: 1. No acute pulmonary embolus, aortic aneurysm or dissection. 2. Moderate-sized hiatal hernia. 3. Subpleural fibrosis in the right middle lobe, lingula and both lower lobes. 4. Kyphosis of the midthoracic spine attributable to chronic anterior compression fractures. Aortic Atherosclerosis (ICD10-I70.0). Electronically Signed   By: Ashley Royalty M.D.   On: 02/17/2018 21:13    Procedures Procedures (including critical care time)  Medications Ordered in ED Medications  predniSONE (DELTASONE) tablet 60 mg (has no administration in time range)  iopamidol (ISOVUE-370) 76 % injection 100 mL (100 mLs Intravenous Contrast Given 02/17/18 2045)     Initial Impression / Assessment  and Plan / ED Course  I have reviewed the triage vital signs and the nursing notes.  Pertinent labs & imaging results that were available during my care of the patient were reviewed by me and considered in my medical decision making (see chart for details).     With severe COPD.  She has no wheezing and no shortness of breath in the emergency department.  She is already taking albuterol 4-6 times a day and is on prednisone and Levaquin.  We will add Atrovent to her medicines and continue the prednisone.  She is to follow-up with her pulmonologist next week Final Clinical Impressions(s) / ED Diagnoses   Final diagnoses:  COPD exacerbation [facility])    ED Discharge Orders         Ordered    predniSONE (DELTASONE) 10 MG tablet  Daily     02/17/18 2231    ipratropium (ATROVENT HFA) 17 MCG/ACT inhaler  Every 4 hours PRN     02/17/18 2231           Milton Ferguson, MD 02/17/18 2232

## 2018-02-20 DIAGNOSIS — K210 Gastro-esophageal reflux disease with esophagitis: Secondary | ICD-10-CM | POA: Diagnosis not present

## 2018-02-20 DIAGNOSIS — M545 Low back pain: Secondary | ICD-10-CM | POA: Diagnosis not present

## 2018-02-20 DIAGNOSIS — J441 Chronic obstructive pulmonary disease with (acute) exacerbation: Secondary | ICD-10-CM | POA: Diagnosis not present

## 2018-02-20 DIAGNOSIS — F419 Anxiety disorder, unspecified: Secondary | ICD-10-CM | POA: Diagnosis not present

## 2018-03-22 DIAGNOSIS — J441 Chronic obstructive pulmonary disease with (acute) exacerbation: Secondary | ICD-10-CM | POA: Diagnosis not present

## 2018-03-22 DIAGNOSIS — M545 Low back pain: Secondary | ICD-10-CM | POA: Diagnosis not present

## 2018-03-22 DIAGNOSIS — E46 Unspecified protein-calorie malnutrition: Secondary | ICD-10-CM | POA: Diagnosis not present

## 2018-03-22 DIAGNOSIS — M810 Age-related osteoporosis without current pathological fracture: Secondary | ICD-10-CM | POA: Diagnosis not present

## 2018-04-03 ENCOUNTER — Emergency Department (HOSPITAL_COMMUNITY)
Admission: EM | Admit: 2018-04-03 | Discharge: 2018-04-03 | Disposition: A | Discharge status: Home / Self Care | Payer: Medicare Other | Source: Home / Self Care | Attending: Emergency Medicine | Admitting: Emergency Medicine

## 2018-04-03 ENCOUNTER — Encounter (HOSPITAL_COMMUNITY): Payer: Self-pay

## 2018-04-03 ENCOUNTER — Emergency Department (HOSPITAL_COMMUNITY): Payer: Medicare Other

## 2018-04-03 ENCOUNTER — Other Ambulatory Visit: Payer: Self-pay

## 2018-04-03 DIAGNOSIS — F17290 Nicotine dependence, other tobacco product, uncomplicated: Secondary | ICD-10-CM | POA: Insufficient documentation

## 2018-04-03 DIAGNOSIS — Z79899 Other long term (current) drug therapy: Secondary | ICD-10-CM | POA: Insufficient documentation

## 2018-04-03 DIAGNOSIS — Z01818 Encounter for other preprocedural examination: Secondary | ICD-10-CM | POA: Diagnosis not present

## 2018-04-03 DIAGNOSIS — R339 Retention of urine, unspecified: Secondary | ICD-10-CM | POA: Diagnosis not present

## 2018-04-03 DIAGNOSIS — K449 Diaphragmatic hernia without obstruction or gangrene: Secondary | ICD-10-CM | POA: Diagnosis not present

## 2018-04-03 DIAGNOSIS — R140 Abdominal distension (gaseous): Secondary | ICD-10-CM | POA: Diagnosis not present

## 2018-04-03 DIAGNOSIS — M25551 Pain in right hip: Secondary | ICD-10-CM | POA: Diagnosis not present

## 2018-04-03 DIAGNOSIS — R2241 Localized swelling, mass and lump, right lower limb: Secondary | ICD-10-CM

## 2018-04-03 DIAGNOSIS — E785 Hyperlipidemia, unspecified: Secondary | ICD-10-CM | POA: Diagnosis not present

## 2018-04-03 DIAGNOSIS — J449 Chronic obstructive pulmonary disease, unspecified: Secondary | ICD-10-CM

## 2018-04-03 DIAGNOSIS — M810 Age-related osteoporosis without current pathological fracture: Secondary | ICD-10-CM | POA: Diagnosis not present

## 2018-04-03 DIAGNOSIS — R410 Disorientation, unspecified: Secondary | ICD-10-CM | POA: Diagnosis not present

## 2018-04-03 DIAGNOSIS — K219 Gastro-esophageal reflux disease without esophagitis: Secondary | ICD-10-CM | POA: Diagnosis not present

## 2018-04-03 DIAGNOSIS — R338 Other retention of urine: Secondary | ICD-10-CM

## 2018-04-03 DIAGNOSIS — S72011A Unspecified intracapsular fracture of right femur, initial encounter for closed fracture: Secondary | ICD-10-CM | POA: Diagnosis not present

## 2018-04-03 DIAGNOSIS — F419 Anxiety disorder, unspecified: Secondary | ICD-10-CM | POA: Diagnosis not present

## 2018-04-03 DIAGNOSIS — M4126 Other idiopathic scoliosis, lumbar region: Secondary | ICD-10-CM | POA: Diagnosis not present

## 2018-04-03 DIAGNOSIS — S72044A Nondisplaced fracture of base of neck of right femur, initial encounter for closed fracture: Secondary | ICD-10-CM | POA: Diagnosis not present

## 2018-04-03 DIAGNOSIS — S72144A Nondisplaced intertrochanteric fracture of right femur, initial encounter for closed fracture: Secondary | ICD-10-CM | POA: Diagnosis not present

## 2018-04-03 LAB — URINALYSIS, ROUTINE W REFLEX MICROSCOPIC
Bacteria, UA: NONE SEEN
Bilirubin Urine: NEGATIVE
Glucose, UA: NEGATIVE mg/dL
Hgb urine dipstick: NEGATIVE
Ketones, ur: NEGATIVE mg/dL
Nitrite: NEGATIVE
Protein, ur: NEGATIVE mg/dL
Specific Gravity, Urine: 1.013 (ref 1.005–1.030)
pH: 5 (ref 5.0–8.0)

## 2018-04-03 LAB — COMPREHENSIVE METABOLIC PANEL
ALT: 9 U/L (ref 0–44)
AST: 17 U/L (ref 15–41)
Albumin: 3.2 g/dL — ABNORMAL LOW (ref 3.5–5.0)
Alkaline Phosphatase: 91 U/L (ref 38–126)
Anion gap: 9 (ref 5–15)
BUN: 22 mg/dL (ref 8–23)
CO2: 29 mmol/L (ref 22–32)
CREATININE: 0.8 mg/dL (ref 0.44–1.00)
Calcium: 9 mg/dL (ref 8.9–10.3)
Chloride: 94 mmol/L — ABNORMAL LOW (ref 98–111)
GFR calc Af Amer: >60 mL/min (ref >60)
GFR calc non Af Amer: >60 mL/min (ref >60)
Glucose, Bld: 114 mg/dL — ABNORMAL HIGH (ref 70–99)
Potassium: 4.3 mmol/L (ref 3.5–5.1)
Sodium: 132 mmol/L — ABNORMAL LOW (ref 135–145)
TOTAL PROTEIN: 7.1 g/dL (ref 6.5–8.1)
Total Bilirubin: 0.7 mg/dL (ref 0.3–1.2)

## 2018-04-03 LAB — CBC WITH DIFFERENTIAL/PLATELET
Abs Immature Granulocytes: 0.05 10*3/uL (ref 0.00–0.07)
Basophils Absolute: 0.1 10*3/uL (ref 0.0–0.1)
Basophils Relative: 1 %
EOS PCT: 2 %
Eosinophils Absolute: 0.2 10*3/uL (ref 0.0–0.5)
HCT: 33.9 % — ABNORMAL LOW (ref 36.0–46.0)
Hemoglobin: 10.5 g/dL — ABNORMAL LOW (ref 12.0–15.0)
Immature Granulocytes: 1 %
Lymphocytes Relative: 7 %
Lymphs Abs: 0.7 10*3/uL (ref 0.7–4.0)
MCH: 26.8 pg (ref 26.0–34.0)
MCHC: 31 g/dL (ref 30.0–36.0)
MCV: 86.5 fL (ref 80.0–100.0)
Monocytes Absolute: 1.3 10*3/uL — ABNORMAL HIGH (ref 0.1–1.0)
Monocytes Relative: 13 %
Neutro Abs: 7.9 10*3/uL — ABNORMAL HIGH (ref 1.7–7.7)
Neutrophils Relative %: 76 %
PLATELETS: 416 10*3/uL — AB (ref 150–400)
RBC: 3.92 MIL/uL (ref 3.87–5.11)
RDW: 13.6 % (ref 11.5–15.5)
WBC: 10.2 10*3/uL (ref 4.0–10.5)
nRBC: 0 % (ref 0.0–0.2)

## 2018-04-03 LAB — LACTIC ACID, PLASMA
Lactic Acid, Venous: 1.1 mmol/L (ref 0.5–1.9)
Lactic Acid, Venous: 0.9 mmol/L (ref 0.5–1.9)

## 2018-04-03 MED ORDER — SODIUM CHLORIDE 0.9 % IV BOLUS
500.0000 mL | Freq: Once | INTRAVENOUS | Status: AC
  Administered 2018-04-03: 500 mL via INTRAVENOUS

## 2018-04-03 MED ORDER — IOHEXOL 300 MG/ML  SOLN
75.0000 mL | Freq: Once | INTRAMUSCULAR | Status: AC | PRN
  Administered 2018-04-03: 75 mL via INTRAVENOUS

## 2018-04-03 MED ORDER — IOHEXOL 300 MG/ML  SOLN
30.0000 mL | Freq: Once | INTRAMUSCULAR | Status: AC | PRN
  Administered 2018-04-03: 30 mL via ORAL

## 2018-04-03 NOTE — Discharge Instructions (Addendum)
Continue her usual medications.  Be careful of the Foley catheter, and watch for problems.

## 2018-04-03 NOTE — ED Triage Notes (Addendum)
Sent by Dr Luan Pulling officePts daughter reports that mothers lasix was increased to 40 mg last Wednesday due to increase fluid retention. Pt has not been voiding well. Confused and frequently falling. Dr Luan Pulling decreased nerve med dosage to xanax and [place] as well

## 2018-04-03 NOTE — ED Notes (Signed)
Patient transported to CT 

## 2018-04-03 NOTE — ED Provider Notes (Signed)
[facility] EMERGENCY DEPARTMENT Provider Note   CSN: [id number] Arrival date & time: 04/03/18  4098    History   Chief Complaint Chief Complaint  Patient presents with   Urinary Retention   Altered Mental Status    HPI [name] is a 84 y.o. female.     HPI   Patient here for evaluation of possible urosepsis, sent from her 89 office.  I spoke with Dr. Luan Pulling, about her, prior to the patient's arrival.  Patient was recently placed on increased dose of diuretic without improvement in urinary output.  Today the doctor is concerned that she might have an infection, causing her discomfort.  The patient is unable to give much history.  She does recall seeing the doctor this morning but cannot give additional history.  Level 5 caveat-poor historian  Past Medical History:  Diagnosis Date   Anxiety    COPD (chronic obstructive pulmonary disease) ([place])    GERD (gastroesophageal reflux disease)    Hyperlipidemia    IBS (irritable bowel syndrome)    Osteoporosis     Patient Active Problem List   Diagnosis Date Noted   Hiatal hernia    Dyspepsia 04/04/2014   Nausea 04/30/2011   Hematochezia 04/30/2011   RUQ pain 03/25/2011   Adenomatous polyps 03/25/2011   ABDOMINAL PAIN, LEFT LOWER QUADRANT 10/16/2009   HYPERLIPIDEMIA 10/09/2009   BRONCHITIS 10/09/2009   OSTEOPOROSIS 10/09/2009    Past Surgical History:  Procedure Laterality Date   COLONOSCOPY  10/2009   sigmoid diverticula, multiple tubulovillous adneomas, needs surveillance Oct 2013   ESOPHAGOGASTRODUODENOSCOPY  04/16/2011   Dr. Gala Romney: Noncritical Schatzkis ring( not manipulated because no dysphagia). Normal esophagus otherwise  large hiatal hernia. Gastric polyp-status post biopsy. Gastric erosions-staus post biopsy. Abnormal bulb-status post biopsy. benign small bowel, stomach biopsy with ulcerated gastric antral mucosa with foveolar hyperplasia and surface erosion, polyp with inflamed  gastric antral mucosa    ESOPHAGOGASTRODUODENOSCOPY N/A 04/29/2014   Dr. Gala Romney: Noncritical Schatzki's ring large hiatal hernia. Retained gastric contents.GES with slight delay     OB History    Gravida  3   Para  3   Term  3   Preterm  0   AB  0   Living        SAB  0   TAB  0   Ectopic  0   Multiple      Live Births               Home Medications    Prior to Admission medications   Medication Sig Start Date End Date Taking? Authorizing Provider  albuterol (PROVENTIL) (2.5 MG/3ML) 0.083% nebulizer solution Take 2.5 mg by nebulization every 6 (six) hours as needed for wheezing or shortness of breath.   Yes [provider]  Albuterol Sulfate (PROAIR HFA IN) Inhale 2 puffs into the lungs every 4 (four) hours as needed (4-6 hours).   Yes [provider]  ALPRAZolam Duanne Moron) 1 MG tablet Take 0.5 mg by mouth 3 (three) times daily as needed for anxiety.  03/27/14  Yes [provider]  cetirizine (ZYRTEC) 10 MG tablet Take 10 mg by mouth daily.   Yes [provider]  furosemide (LASIX) 40 MG tablet Take 40 mg by mouth daily.   Yes [provider]  pantoprazole (PROTONIX) 40 MG tablet TAKE (1) TABLET BY MOUTH TWICE A DAY WITH MEALS (BREAKFAST AND SUPPER) Patient taking differently: Take 40 mg by mouth daily as needed.  10/04/17  Yes Annitta Needs, NP  potassium chloride (K-DUR) 10 MEQ tablet Take 10 mEq by mouth daily.   Yes [provider]  traMADol (ULTRAM) 50 MG tablet Take 100 mg by mouth every 4 (four) hours as needed.   Yes [provider]  umeclidinium-vilanterol (ANORO ELLIPTA) 62.5-25 MCG/INH AEPB Inhale 1 puff into the lungs daily.   Yes [provider]  VOLTAREN 1 % GEL Apply 1 g topically daily as needed. pain 07/01/14  Yes [provider]  zolpidem (AMBIEN) 10 MG tablet Take 5 mg by mouth at bedtime as needed for sleep.    Yes [provider]  ipratropium (ATROVENT HFA) 17  MCG/ACT inhaler Inhale 2 puffs into the lungs every 4 (four) hours as needed for wheezing. Patient not taking: Reported on 04/03/2018 02/17/18   Milton Ferguson, MD  predniSONE (DELTASONE) 10 MG tablet Take 2 tablets (20 mg total) by mouth daily. Patient not taking: Reported on 04/03/2018 02/17/18   Milton Ferguson, MD    Family History Family History  Problem Relation Age of Onset   Stroke Mother    Diabetes Mother    Heart disease Father    Colon cancer Neg Hx     Social History Social History   Tobacco Use   Smoking status: Never Smoker   Smokeless tobacco: Current User    Types: Snuff  Substance Use Topics   Alcohol use: No   Drug use: No     Allergies   Codeine; Codeine; Levofloxacin; Sulfonamide derivatives; and Sulfa antibiotics   Review of Systems Review of Systems  Unable to perform ROS: Mental status change     Physical Exam Updated Vital Signs BP 114/79    Pulse 85    Temp (!) 97.3 F (36.3 C) (Rectal)    Resp 14    Wt 50.8 kg    SpO2 96%    BMI 18.64 kg/m   Physical Exam Vitals signs and nursing note reviewed.  Constitutional:      General: She is not in acute distress.    Appearance: She is well-developed. She is ill-appearing. She is not toxic-appearing or diaphoretic.  HENT:     Head: Normocephalic and atraumatic.     Right Ear: External ear normal.     Left Ear: External ear normal.     Nose: Nose normal. No congestion or rhinorrhea.     Mouth/Throat:     Mouth: Mucous membranes are dry.  Eyes:     Conjunctiva/sclera: Conjunctivae normal.     Pupils: Pupils are equal, round, and reactive to light.  Neck:     Musculoskeletal: Normal range of motion and neck supple.     Trachea: Phonation normal.  Cardiovascular:     Rate and Rhythm: Normal rate and regular rhythm.     Heart sounds: Normal heart sounds.  Pulmonary:     Effort: Pulmonary effort is normal.     Breath sounds: Normal breath sounds.  Abdominal:     General: There is no  distension.     Palpations: Abdomen is soft.     Tenderness: There is no abdominal tenderness.     Hernia: No hernia is present.  Musculoskeletal:     Comments: Unilateral right lower leg swelling, mild with slight tenderness.  Nonspecific appearance.  Neurovascular intact distally in both feet.  Skin:    General: Skin is warm and dry.     Coloration: Skin is not jaundiced or pale.  Neurological:  Mental Status: She is alert and oriented to person, place, and time.     Cranial Nerves: No cranial nerve deficit.     Sensory: No sensory deficit.     Motor: No abnormal muscle tone.     Coordination: Coordination normal.  Psychiatric:        Mood and Affect: Mood normal.        Behavior: Behavior normal.        Thought Content: Thought content normal.        Judgment: Judgment normal.      ED Treatments / Results  Labs (all labs ordered are listed, but only abnormal results are displayed) Labs Reviewed  COMPREHENSIVE METABOLIC PANEL - Abnormal; Notable for the following components:      Result Value   Sodium 132 (*)    Chloride 94 (*)    Glucose, Bld 114 (*)    Albumin 3.2 (*)    All other components within normal limits  CBC WITH DIFFERENTIAL/PLATELET - Abnormal; Notable for the following components:   Hemoglobin 10.5 (*)    HCT 33.9 (*)    Platelets 416 (*)    Neutro Abs 7.9 (*)    Monocytes Absolute 1.3 (*)    All other components within normal limits  URINALYSIS, ROUTINE W REFLEX MICROSCOPIC - Abnormal; Notable for the following components:   Leukocytes,Ua TRACE (*)    All other components within normal limits  CULTURE, BLOOD (ROUTINE X 2)  CULTURE, BLOOD (ROUTINE X 2)  LACTIC ACID, PLASMA  LACTIC ACID, PLASMA    EKG None  Radiology Ct Head Wo Contrast  Result Date: 04/03/2018 CLINICAL DATA:  Confusion, acute, unexplained. EXAM: CT HEAD WITHOUT CONTRAST TECHNIQUE: Contiguous axial images were obtained from the base of the skull through the vertex without  intravenous contrast. COMPARISON:  None. FINDINGS: Brain: Advanced atrophy and diffuse white matter disease is present. No acute cortical infarct is present. Basal ganglia are intact. Brainstem and cerebellum are within normal limits. The ventricles are of proportionate to the degree of atrophy. No significant extraaxial fluid collection is present. Vascular: Atherosclerotic changes are present within the cavernous internal carotid arteries. There is no asymmetric hyperdense vessel. Skull: Calvarium is intact. No focal lytic or blastic lesions are present. Sinuses/Orbits: The paranasal sinuses and mastoid air cells are clear. The globes and orbits are within normal limits. IMPRESSION: 1. No acute intracranial abnormality. 2. Extensive white matter disease. This likely reflects the sequela of chronic microvascular ischemia. Electronically Signed   By: San Morelle M.D.   On: 04/03/2018 16:50   Ct Abdomen Pelvis W Contrast  Result Date: 04/03/2018 CLINICAL DATA:  Abdominal infection. EXAM: CT ABDOMEN AND PELVIS WITH CONTRAST TECHNIQUE: Multidetector CT imaging of the abdomen and pelvis was performed using the standard protocol following bolus administration of intravenous contrast. CONTRAST:  13mL OMNIPAQUE IOHEXOL 300 MG/ML SOLN orally, 65mL OMNIPAQUE IOHEXOL 300 MG/ML SOLN intravenously. COMPARISON:  CT scan of April 21, 2011. FINDINGS: Lower chest: Large sliding-type hiatal hernia is noted. Visualized lung bases are unremarkable. Hepatobiliary: No focal liver abnormality is seen. No gallstones, gallbladder wall thickening, or biliary dilatation. Pancreas: Unremarkable. No pancreatic ductal dilatation or surrounding inflammatory changes. Spleen: Normal in size without focal abnormality. Adrenals/Urinary Tract: Adrenal glands appear normal. Nonobstructive left renal calculus is noted. No hydronephrosis or renal obstruction is noted. Foley catheter is noted in urinary bladder. Stomach/Bowel: There is no  evidence of bowel obstruction or inflammation. The appendix is unremarkable. Vascular/Lymphatic: Aortic atherosclerosis. No enlarged abdominal  or pelvic lymph nodes. Reproductive: Uterus and bilateral adnexa are unremarkable. Other: No abdominal wall hernia or abnormality. No abdominopelvic ascites. Musculoskeletal: No acute or significant osseous findings. IMPRESSION: Large sliding-type hiatal hernia. Nonobstructive left renal calculus. No hydronephrosis or renal obstruction is noted. No other acute abnormality seen in the abdomen or pelvis. Aortic Atherosclerosis (ICD10-I70.0). Electronically Signed   By: Marijo Conception, M.D.   On: 04/03/2018 14:13    Procedures Procedures (including critical care time)  Medications Ordered in ED Medications  sodium chloride 0.9 % bolus 500 mL ( Intravenous Stopped 04/03/18 1205)  iohexol (OMNIPAQUE) 300 MG/ML solution 30 mL (30 mLs Oral Contrast Given 04/03/18 1111)  iohexol (OMNIPAQUE) 300 MG/ML solution 75 mL (75 mLs Intravenous Contrast Given 04/03/18 1330)     Initial Impression / Assessment and Plan / ED Course  I have reviewed the triage vital signs and the nursing notes.  Pertinent labs & imaging results that were available during my care of the patient were reviewed by me and considered in my medical decision making (see chart for details).  Clinical Course as of Apr 02 1716  Mon Apr 03, 2018  1049 Bladder scan indicates urinary retention with greater than 300 cc of urine in the urinary bladder.  Therefore, a CT of the abdomen pelvis is ordered to evaluate for causes of bladder outlet obstruction.   [EW]  1550 Normal  Lactic acid, plasma [EW]  1550 Normal except sodium low, chloride low, glucose high, albumin low  Comprehensive metabolic panel(!) [EW]  4825 Normal  Urinalysis, Routine w reflex microscopic(!) [EW]  1550 Normal except hemoglobin low, platelets elevated  CBC with Differential(!) [EW]  1550 Normal  Urinalysis, Routine w reflex  microscopic(!) [EW]  1551 CT Abdomen Pelvis W Contrast [EW]  1551 No acute intra-abdominal abnormalities, images reviewed by me  CT Abdomen Pelvis W Contrast [EW]  1558 Case results discussed with patient's daughter, [name].  Her phone number is [phone].  She reports that the patient has been confused for 2 weeks and worsening over the last 2 days.  She is worried that the patient has had a stroke.  She wants imaging done and a CT of the head was ordered.  [name] is also concerned about a skin lesion on the back of her mother's neck.  It has been there for a long time and the patient tries to "hide it."   [EW]  1694 Patient has a small mole on the left lower neck region, which appears fairly normal and does not have signs concerning for cancer.   [EW]  1716 No CVA or mass, images reviewed by me  CT Head Wo Contrast [EW]    Clinical Course User Index [EW] Daleen Bo, MD        Patient Vitals for the past 24 hrs:  BP Temp Temp src Pulse Resp SpO2 Weight  04/03/18 1600 114/79 -- -- 85 14 96 % --  04/03/18 1545 -- -- -- 89 16 99 % --  04/03/18 1530 124/81 -- -- 85 11 94 % --  04/03/18 1445 -- -- -- 84 10 95 % --  04/03/18 1430 119/79 -- -- 82 13 96 % --  04/03/18 1415 -- -- -- 82 13 96 % --  04/03/18 1400 116/82 -- -- 83 16 96 % --  04/03/18 1330 117/82 -- -- 83 15 96 % --  04/03/18 1300 119/82 -- -- 86 12 94 % --  04/03/18 1230 132/82 -- -- 85 13  94 % --  04/03/18 1200 112/78 -- -- 82 17 94 % --  04/03/18 1136 -- (!) 97.3 F (36.3 C) Rectal -- -- -- --  04/03/18 1130 109/79 -- -- 84 14 97 % --  04/03/18 1124 -- -- -- 82 11 95 % --  04/03/18 1100 128/77 -- -- 85 15 99 % --  04/03/18 1012 120/79 -- -- -- -- -- --  04/03/18 1010 -- (!) 97.5 F (36.4 C) Oral 92 20 94 % --  04/03/18 1005 -- -- -- -- -- -- 50.8 kg    3:52 PM Reevaluation with update and discussion. After initial assessment and treatment, an updated evaluation reveals comfortable. She wants to go home.  Findings discussed with daughter [name]). She is a CNA, and can help with with the foley.  All questions answered. Daleen Bo   Medical Decision Making: Urinary retention, cause not clear.  Screening with CT imaging done without findings of abnormality.  Suspect infection, UTI, metabolic instability impending vascular collapse.  CRITICAL CARE-no Performed by: Daleen Bo  Nursing Notes Reviewed/ Care Coordinated Applicable Imaging Reviewed Interpretation of Laboratory Data incorporated into ED treatment  The patient appears reasonably screened and/or stabilized for discharge and I doubt any other medical condition or other [facility] requiring further screening, evaluation, or treatment in the ED at this time prior to discharge.  Plan: Home Medications-continue usual; Home Treatments-catheter care at home; return here if the recommended treatment, does not improve the symptoms; Recommended follow up-urology follow-up 1 week for catheter removal and voiding trial   Final Clinical Impressions(s) / ED Diagnoses   Final diagnoses:  Acute urinary retention    ED Discharge Orders    None       Daleen Bo, MD 04/03/18 1729

## 2018-04-03 NOTE — ED Notes (Signed)
Pt returned from CT °

## 2018-04-05 ENCOUNTER — Emergency Department (HOSPITAL_COMMUNITY): Payer: Medicare Other

## 2018-04-05 ENCOUNTER — Other Ambulatory Visit: Payer: Self-pay

## 2018-04-05 ENCOUNTER — Inpatient Hospital Stay (HOSPITAL_COMMUNITY)
Admission: EM | Admit: 2018-04-05 | Discharge: 2018-04-09 | DRG: 482 | Disposition: A | Discharge status: Home Health | Payer: Medicare Other | Attending: Pulmonary Disease | Admitting: Pulmonary Disease

## 2018-04-05 ENCOUNTER — Inpatient Hospital Stay (HOSPITAL_COMMUNITY): Payer: Medicare Other

## 2018-04-05 ENCOUNTER — Encounter (HOSPITAL_COMMUNITY): Payer: Self-pay | Admitting: Emergency Medicine

## 2018-04-05 DIAGNOSIS — J449 Chronic obstructive pulmonary disease, unspecified: Secondary | ICD-10-CM | POA: Diagnosis not present

## 2018-04-05 DIAGNOSIS — S72144A Nondisplaced intertrochanteric fracture of right femur, initial encounter for closed fracture: Secondary | ICD-10-CM | POA: Diagnosis not present

## 2018-04-05 DIAGNOSIS — M810 Age-related osteoporosis without current pathological fracture: Secondary | ICD-10-CM | POA: Diagnosis present

## 2018-04-05 DIAGNOSIS — Y92012 Bathroom of single-family (private) house as the place of occurrence of the external cause: Secondary | ICD-10-CM

## 2018-04-05 DIAGNOSIS — W010XXA Fall on same level from slipping, tripping and stumbling without subsequent striking against object, initial encounter: Secondary | ICD-10-CM | POA: Diagnosis present

## 2018-04-05 DIAGNOSIS — K5909 Other constipation: Secondary | ICD-10-CM

## 2018-04-05 DIAGNOSIS — M25551 Pain in right hip: Secondary | ICD-10-CM | POA: Diagnosis not present

## 2018-04-05 DIAGNOSIS — R52 Pain, unspecified: Secondary | ICD-10-CM

## 2018-04-05 DIAGNOSIS — K219 Gastro-esophageal reflux disease without esophagitis: Secondary | ICD-10-CM | POA: Diagnosis present

## 2018-04-05 DIAGNOSIS — S72011A Unspecified intracapsular fracture of right femur, initial encounter for closed fracture: Secondary | ICD-10-CM | POA: Diagnosis not present

## 2018-04-05 DIAGNOSIS — F419 Anxiety disorder, unspecified: Secondary | ICD-10-CM | POA: Diagnosis present

## 2018-04-05 DIAGNOSIS — F17220 Nicotine dependence, chewing tobacco, uncomplicated: Secondary | ICD-10-CM | POA: Diagnosis present

## 2018-04-05 DIAGNOSIS — Z9181 History of falling: Secondary | ICD-10-CM | POA: Diagnosis not present

## 2018-04-05 DIAGNOSIS — M84459A Pathological fracture, hip, unspecified, initial encounter for fracture: Secondary | ICD-10-CM | POA: Diagnosis not present

## 2018-04-05 DIAGNOSIS — K5900 Constipation, unspecified: Secondary | ICD-10-CM | POA: Diagnosis not present

## 2018-04-05 DIAGNOSIS — R404 Transient alteration of awareness: Secondary | ICD-10-CM | POA: Diagnosis not present

## 2018-04-05 DIAGNOSIS — S72001D Fracture of unspecified part of neck of right femur, subsequent encounter for closed fracture with routine healing: Secondary | ICD-10-CM | POA: Diagnosis not present

## 2018-04-05 DIAGNOSIS — W19XXXA Unspecified fall, initial encounter: Secondary | ICD-10-CM | POA: Diagnosis not present

## 2018-04-05 DIAGNOSIS — T148XXA Other injury of unspecified body region, initial encounter: Secondary | ICD-10-CM

## 2018-04-05 DIAGNOSIS — S72044A Nondisplaced fracture of base of neck of right femur, initial encounter for closed fracture: Secondary | ICD-10-CM | POA: Diagnosis not present

## 2018-04-05 DIAGNOSIS — S72001A Fracture of unspecified part of neck of right femur, initial encounter for closed fracture: Secondary | ICD-10-CM | POA: Diagnosis not present

## 2018-04-05 DIAGNOSIS — Z791 Long term (current) use of non-steroidal anti-inflammatories (NSAID): Secondary | ICD-10-CM | POA: Diagnosis not present

## 2018-04-05 DIAGNOSIS — M80051D Age-related osteoporosis with current pathological fracture, right femur, subsequent encounter for fracture with routine healing: Secondary | ICD-10-CM | POA: Diagnosis not present

## 2018-04-05 DIAGNOSIS — G8929 Other chronic pain: Secondary | ICD-10-CM | POA: Diagnosis present

## 2018-04-05 DIAGNOSIS — R339 Retention of urine, unspecified: Secondary | ICD-10-CM | POA: Diagnosis present

## 2018-04-05 DIAGNOSIS — K449 Diaphragmatic hernia without obstruction or gangrene: Secondary | ICD-10-CM | POA: Diagnosis not present

## 2018-04-05 DIAGNOSIS — Z79899 Other long term (current) drug therapy: Secondary | ICD-10-CM | POA: Diagnosis not present

## 2018-04-05 DIAGNOSIS — M4126 Other idiopathic scoliosis, lumbar region: Secondary | ICD-10-CM | POA: Diagnosis not present

## 2018-04-05 DIAGNOSIS — Z01818 Encounter for other preprocedural examination: Secondary | ICD-10-CM

## 2018-04-05 DIAGNOSIS — E785 Hyperlipidemia, unspecified: Secondary | ICD-10-CM | POA: Diagnosis not present

## 2018-04-05 DIAGNOSIS — J441 Chronic obstructive pulmonary disease with (acute) exacerbation: Secondary | ICD-10-CM | POA: Diagnosis present

## 2018-04-05 DIAGNOSIS — R1013 Epigastric pain: Secondary | ICD-10-CM | POA: Diagnosis present

## 2018-04-05 DIAGNOSIS — R531 Weakness: Secondary | ICD-10-CM | POA: Diagnosis not present

## 2018-04-05 DIAGNOSIS — Z466 Encounter for fitting and adjustment of urinary device: Secondary | ICD-10-CM | POA: Diagnosis not present

## 2018-04-05 DIAGNOSIS — S72091A Other fracture of head and neck of right femur, initial encounter for closed fracture: Secondary | ICD-10-CM | POA: Diagnosis not present

## 2018-04-05 LAB — CBC WITH DIFFERENTIAL/PLATELET
Abs Immature Granulocytes: 0.08 10*3/uL — ABNORMAL HIGH (ref 0.00–0.07)
Basophils Absolute: 0.1 10*3/uL (ref 0.0–0.1)
Basophils Relative: 1 %
Eosinophils Absolute: 0.1 10*3/uL (ref 0.0–0.5)
Eosinophils Relative: 1 %
HCT: 36.6 % (ref 36.0–46.0)
Hemoglobin: 11.5 g/dL — ABNORMAL LOW (ref 12.0–15.0)
Immature Granulocytes: 1 %
Lymphocytes Relative: 6 %
Lymphs Abs: 0.8 10*3/uL (ref 0.7–4.0)
MCH: 26.7 pg (ref 26.0–34.0)
MCHC: 31.4 g/dL (ref 30.0–36.0)
MCV: 85.1 fL (ref 80.0–100.0)
Monocytes Absolute: 1.3 10*3/uL — ABNORMAL HIGH (ref 0.1–1.0)
Monocytes Relative: 10 %
Neutro Abs: 10.8 10*3/uL — ABNORMAL HIGH (ref 1.7–7.7)
Neutrophils Relative %: 81 %
Platelets: 427 10*3/uL — ABNORMAL HIGH (ref 150–400)
RBC: 4.3 MIL/uL (ref 3.87–5.11)
RDW: 13.9 % (ref 11.5–15.5)
WBC: 13.2 10*3/uL — ABNORMAL HIGH (ref 4.0–10.5)
nRBC: 0 % (ref 0.0–0.2)

## 2018-04-05 LAB — BASIC METABOLIC PANEL
Anion gap: 12 (ref 5–15)
BUN: 16 mg/dL (ref 8–23)
CO2: 30 mmol/L (ref 22–32)
Calcium: 8.8 mg/dL — ABNORMAL LOW (ref 8.9–10.3)
Chloride: 91 mmol/L — ABNORMAL LOW (ref 98–111)
Creatinine, Ser: 0.83 mg/dL (ref 0.44–1.00)
GFR calc Af Amer: >60 mL/min (ref >60)
GFR calc non Af Amer: >60 mL/min (ref >60)
Glucose, Bld: 117 mg/dL — ABNORMAL HIGH (ref 70–99)
Potassium: 4.3 mmol/L (ref 3.5–5.1)
Sodium: 133 mmol/L — ABNORMAL LOW (ref 135–145)

## 2018-04-05 MED ORDER — ZOLPIDEM TARTRATE 5 MG PO TABS: 5.0000 mg | ORAL_TABLET | Freq: Every evening | ORAL | Status: DC | PRN

## 2018-04-05 MED ORDER — IPRATROPIUM-ALBUTEROL 0.5-2.5 (3) MG/3ML IN SOLN
3.0000 mL | Freq: Four times a day (QID) | RESPIRATORY_TRACT | Status: DC
  Administered 2018-04-06 – 2018-04-08 (×9): 3 mL via RESPIRATORY_TRACT
  Filled 2018-04-05 (×11): qty 3

## 2018-04-05 MED ORDER — METHOCARBAMOL 500 MG PO TABS
500.0000 mg | ORAL_TABLET | Freq: Four times a day (QID) | ORAL | Status: DC | PRN
  Administered 2018-04-06 – 2018-04-08 (×2): 500 mg via ORAL
  Filled 2018-04-05 (×2): qty 1

## 2018-04-05 MED ORDER — ALPRAZOLAM 0.5 MG PO TABS
0.5000 mg | ORAL_TABLET | Freq: Three times a day (TID) | ORAL | Status: DC | PRN
  Administered 2018-04-06 – 2018-04-08 (×3): 0.5 mg via ORAL
  Filled 2018-04-05 (×3): qty 1

## 2018-04-05 MED ORDER — CALCIUM CARBONATE-VITAMIN D 500-200 MG-UNIT PO TABS
1.0000 | ORAL_TABLET | Freq: Three times a day (TID) | ORAL | Status: DC
  Administered 2018-04-05 – 2018-04-09 (×10): 1 via ORAL
  Filled 2018-04-05 (×11): qty 1

## 2018-04-05 MED ORDER — PANTOPRAZOLE SODIUM 40 MG PO TBEC
40.0000 mg | DELAYED_RELEASE_TABLET | Freq: Every day | ORAL | Status: DC
  Administered 2018-04-06 – 2018-04-09 (×4): 40 mg via ORAL
  Filled 2018-04-05 (×4): qty 1

## 2018-04-05 MED ORDER — DOCUSATE SODIUM 100 MG PO CAPS
100.0000 mg | ORAL_CAPSULE | Freq: Two times a day (BID) | ORAL | Status: DC
  Administered 2018-04-05: 100 mg via ORAL
  Filled 2018-04-05 (×2): qty 1

## 2018-04-05 MED ORDER — UMECLIDINIUM-VILANTEROL 62.5-25 MCG/INH IN AEPB
1.0000 | INHALATION_SPRAY | Freq: Every day | RESPIRATORY_TRACT | Status: DC
  Administered 2018-04-07 – 2018-04-08 (×2): 1 via RESPIRATORY_TRACT
  Filled 2018-04-05: qty 14

## 2018-04-05 MED ORDER — ALBUTEROL SULFATE (2.5 MG/3ML) 0.083% IN NEBU: 2.5000 mg | INHALATION_SOLUTION | Freq: Four times a day (QID) | RESPIRATORY_TRACT | Status: DC | PRN

## 2018-04-05 MED ORDER — FENTANYL CITRATE (PF) 100 MCG/2ML IJ SOLN
50.0000 ug | Freq: Once | INTRAMUSCULAR | Status: AC
  Administered 2018-04-05: 13:00:00 50 ug via INTRAMUSCULAR
  Filled 2018-04-05: qty 2

## 2018-04-05 MED ORDER — CHLORHEXIDINE GLUCONATE 4 % EX LIQD
60.0000 mL | Freq: Once | CUTANEOUS | Status: AC
  Administered 2018-04-06: 4 via TOPICAL
  Filled 2018-04-05: qty 60

## 2018-04-05 MED ORDER — IPRATROPIUM-ALBUTEROL 0.5-2.5 (3) MG/3ML IN SOLN
3.0000 mL | Freq: Four times a day (QID) | RESPIRATORY_TRACT | Status: DC
  Administered 2018-04-05: 3 mL via RESPIRATORY_TRACT
  Filled 2018-04-05: qty 3

## 2018-04-05 MED ORDER — METHOCARBAMOL 1000 MG/10ML IJ SOLN
500.0000 mg | Freq: Four times a day (QID) | INTRAVENOUS | Status: DC | PRN
  Filled 2018-04-05: qty 5

## 2018-04-05 MED ORDER — HYDROMORPHONE HCL 1 MG/ML IJ SOLN
0.5000 mg | Freq: Once | INTRAMUSCULAR | Status: AC
  Administered 2018-04-05: 0.5 mg via INTRAVENOUS
  Filled 2018-04-05: qty 1

## 2018-04-05 MED ORDER — HYDROMORPHONE HCL 1 MG/ML IJ SOLN
0.2500 mg | INTRAMUSCULAR | Status: DC | PRN
  Administered 2018-04-05 – 2018-04-06 (×2): 0.5 mg via INTRAVENOUS
  Filled 2018-04-05 (×2): qty 0.5

## 2018-04-05 NOTE — ED Triage Notes (Signed)
Pt fell at home, denies hitting her head. C/o of left hip pain.

## 2018-04-05 NOTE — ED Provider Notes (Signed)
[facility] EMERGENCY DEPARTMENT Provider Note   CSN: [id number] Arrival date & time: 04/05/18  1245    History   Chief Complaint Chief Complaint  Patient presents with  . Fall    HPI [name] is a 84 y.o. female.     HPI   83yF with R hip pain. Had mechanical last night when ambulating from the bathroom. Severe and persistent R hip pain since then. Doesn't think hit head. No LOC. No acute HA, neck or back pain. No blood thinners. Just recently seen in the ED for urinary retention and has foley in place.   Past Medical History:  Diagnosis Date  . Anxiety   . COPD (chronic obstructive pulmonary disease) ([place])   . GERD (gastroesophageal reflux disease)   . Hyperlipidemia   . IBS (irritable bowel syndrome)   . Osteoporosis     Patient Active Problem List   Diagnosis Date Noted  . Hiatal hernia   . Dyspepsia 04/04/2014  . Nausea 04/30/2011  . Hematochezia 04/30/2011  . RUQ pain 03/25/2011  . Adenomatous polyps 03/25/2011  . ABDOMINAL PAIN, LEFT LOWER QUADRANT 10/16/2009  . HYPERLIPIDEMIA 10/09/2009  . BRONCHITIS 10/09/2009  . OSTEOPOROSIS 10/09/2009    Past Surgical History:  Procedure Laterality Date  . COLONOSCOPY  10/2009   sigmoid diverticula, multiple tubulovillous adneomas, needs surveillance Oct 2013  . ESOPHAGOGASTRODUODENOSCOPY  04/16/2011   Dr. Gala Romney: Noncritical Schatzkis ring( not manipulated because no dysphagia). Normal esophagus otherwise  large hiatal hernia. Gastric polyp-status post biopsy. Gastric erosions-staus post biopsy. Abnormal bulb-status post biopsy. benign small bowel, stomach biopsy with ulcerated gastric antral mucosa with foveolar hyperplasia and surface erosion, polyp with inflamed gastric antral mucosa   . ESOPHAGOGASTRODUODENOSCOPY N/A 04/29/2014   Dr. Gala Romney: Noncritical Schatzki's ring large hiatal hernia. Retained gastric contents.GES with slight delay     OB History    Gravida  3   Para  3   Term  3   Preterm  0   AB  0   Living        SAB  0   TAB  0   Ectopic  0   Multiple      Live Births               Home Medications    Prior to Admission medications   Medication Sig Start Date End Date Taking? Authorizing Provider  albuterol (PROVENTIL) (2.5 MG/3ML) 0.083% nebulizer solution Take 2.5 mg by nebulization every 6 (six) hours as needed for wheezing or shortness of breath.   Yes [provider]  Albuterol Sulfate (PROAIR HFA IN) Inhale 2 puffs into the lungs every 4 (four) hours as needed (4-6 hours).   Yes [provider]  ALPRAZolam Duanne Moron) 1 MG tablet Take 1 mg by mouth 3 (three) times daily as needed for anxiety.  03/27/14  Yes [provider]  cetirizine (ZYRTEC) 10 MG tablet Take 10 mg by mouth daily.   Yes [provider]  furosemide (LASIX) 40 MG tablet Take 40 mg by mouth daily.   Yes [provider]  pantoprazole (PROTONIX) 40 MG tablet TAKE (1) TABLET BY MOUTH TWICE A DAY WITH MEALS (BREAKFAST AND SUPPER) Patient taking differently: Take 40 mg by mouth daily as needed.  10/04/17  Yes Annitta Needs, NP  potassium chloride (K-DUR) 10 MEQ tablet Take 10 mEq by mouth daily.   Yes [provider]  traMADol (ULTRAM) 50 MG tablet Take 100 mg by mouth every  4 (four) hours as needed.   Yes [provider]  umeclidinium-vilanterol (ANORO ELLIPTA) 62.5-25 MCG/INH AEPB Inhale 1 puff into the lungs daily.   Yes [provider]  VOLTAREN 1 % GEL Apply 1 g topically daily as needed. pain 07/01/14  Yes [provider]  zolpidem (AMBIEN) 10 MG tablet Take 5 mg by mouth at bedtime as needed for sleep.    Yes [provider]  ipratropium (ATROVENT HFA) 17 MCG/ACT inhaler Inhale 2 puffs into the lungs every 4 (four) hours as needed for wheezing. Patient not taking: Reported on 04/03/2018 02/17/18   Milton Ferguson, MD  predniSONE (DELTASONE) 10 MG tablet Take 2 tablets (20 mg total) by mouth daily. Patient not  taking: Reported on 04/03/2018 02/17/18   Milton Ferguson, MD    Family History Family History  Problem Relation Age of Onset  . Stroke Mother   . Diabetes Mother   . Heart disease Father   . Colon cancer Neg Hx     Social History Social History   Tobacco Use  . Smoking status: Never Smoker  . Smokeless tobacco: Current User    Types: Snuff  Substance Use Topics  . Alcohol use: No  . Drug use: No     Allergies   Codeine; Codeine; Levofloxacin; Sulfonamide derivatives; and Sulfa antibiotics   Review of Systems Review of Systems  All systems reviewed and negative, other than as noted in HPI.  Physical Exam Updated Vital Signs BP 124/82   Pulse 87   Temp 97.8 F (36.6 C) (Oral)   Resp 12   Ht 5\' 2"  (1.575 m)   Wt 50.8 kg   SpO2 91%   BMI 20.49 kg/m   Physical Exam Vitals signs and nursing note reviewed.  Constitutional:      General: She is not in acute distress.    Appearance: She is well-developed.     Comments: Frail/chronically ill appearing. Non-toxic.   HENT:     Head: Normocephalic and atraumatic.  Eyes:     General:        Right eye: No discharge.        Left eye: No discharge.     Conjunctiva/sclera: Conjunctivae normal.  Neck:     Musculoskeletal: Neck supple.  Cardiovascular:     Rate and Rhythm: Normal rate and regular rhythm.     Heart sounds: Normal heart sounds. No murmur. No friction rub. No gallop.   Pulmonary:     Effort: Pulmonary effort is normal. No respiratory distress.     Breath sounds: Normal breath sounds.  Abdominal:     General: There is no distension.     Palpations: Abdomen is soft.     Tenderness: There is no abdominal tenderness.  Musculoskeletal:        General: Tenderness present.     Comments: RLE externally rotated. Perhaps mildly shortened. Severe pain with attempted ROM of R hip. Closed injury. NVI distally. No midline spinal tenderness.   Skin:    General: Skin is warm and dry.  Neurological:     Mental  Status: She is alert.  Psychiatric:        Behavior: Behavior normal.        Thought Content: Thought content normal.      ED Treatments / Results  Labs (all labs ordered are listed, but only abnormal results are displayed) Labs Reviewed  CBC WITH DIFFERENTIAL/PLATELET - Abnormal; Notable for the following components:      Result  Value   WBC 13.2 (*)    Hemoglobin 11.5 (*)    Platelets 427 (*)    Neutro Abs 10.8 (*)    Monocytes Absolute 1.3 (*)    Abs Immature Granulocytes 0.08 (*)    All other components within normal limits  BASIC METABOLIC PANEL - Abnormal; Notable for the following components:   Sodium 133 (*)    Chloride 91 (*)    Glucose, Bld 117 (*)    Calcium 8.8 (*)    All other components within normal limits    EKG None  Radiology Dg Lumbar Spine Complete  Result Date: 04/05/2018 CLINICAL DATA:  84 year old female with right hip and lower back pain after falling at home earlier today EXAM: LUMBAR SPINE - COMPLETE 4+ VIEW COMPARISON:  Prior CT scan of the abdomen and pelvis 04/03/2018 FINDINGS: Rotary and dextroconvex scoliosis centered at L3. The bones are diffusely osteopenic in appearance. Radiographs are extremely limited secondary to low bone density. Visualized bowel gas pattern is unremarkable. IMPRESSION: The bones are significantly demineralized which severely limits the diagnostic accuracy of this examination, particularly in light of underlying scoliosis. If there is persistent clinical concern for lumbar spinal fracture, recommend further evaluation with MRI. Rotary and dextroconvex scoliosis centered at L3. Electronically Signed   By: Jacqulynn Cadet M.D.   On: 04/05/2018 14:04   Ct Head Wo Contrast  Result Date: 04/03/2018 CLINICAL DATA:  Confusion, acute, unexplained. EXAM: CT HEAD WITHOUT CONTRAST TECHNIQUE: Contiguous axial images were obtained from the base of the skull through the vertex without intravenous contrast. COMPARISON:  None. FINDINGS:  Brain: Advanced atrophy and diffuse white matter disease is present. No acute cortical infarct is present. Basal ganglia are intact. Brainstem and cerebellum are within normal limits. The ventricles are of proportionate to the degree of atrophy. No significant extraaxial fluid collection is present. Vascular: Atherosclerotic changes are present within the cavernous internal carotid arteries. There is no asymmetric hyperdense vessel. Skull: Calvarium is intact. No focal lytic or blastic lesions are present. Sinuses/Orbits: The paranasal sinuses and mastoid air cells are clear. The globes and orbits are within normal limits. IMPRESSION: 1. No acute intracranial abnormality. 2. Extensive white matter disease. This likely reflects the sequela of chronic microvascular ischemia. Electronically Signed   By: San Morelle M.D.   On: 04/03/2018 16:50   Dg Chest Port 1 View  Result Date: 04/05/2018 CLINICAL DATA:  Preop for hip surgery. EXAM: PORTABLE CHEST 1 VIEW COMPARISON:  02/17/2018 FINDINGS: Cardiac silhouette is normal in size. Moderate hiatal hernia. No mediastinal or hilar masses. Prominent bronchovascular and interstitial markings bilaterally similar to prior exams. Additional left lung base opacity is most likely atelectasis. No convincing pneumonia or pulmonary edema. No visualized pleural effusion and no pneumothorax. Skeletal structures are demineralized but grossly intact IMPRESSION: No acute cardiopulmonary disease. Electronically Signed   By: Lajean Manes M.D.   On: 04/05/2018 14:28   Dg Hip Unilat W Or Wo Pelvis 2-3 Views Right  Result Date: 04/05/2018 CLINICAL DATA:  Pt states she fell at home onset today and is c/o right hip pain and lower back pain. Pt denies any surgery to lower back or right hip. EXAM: DG HIP (WITH OR WITHOUT PELVIS) 2-3V RIGHT COMPARISON:  None. FINDINGS: There is a nondisplaced, non comminuted subcapital fracture of the right femoral neck with mild valgus angulation. No  other fractures.  No bone lesions. Hip joints, SI joints and pubic symphysis are normally spaced and aligned. Skeletal structures are demineralized.  Mild subcutaneous soft tissue edema lateral to the proximal right femur. IMPRESSION: 1. Nondisplaced, non comminuted subcapital fracture of the right femoral neck with mild valgus angulation. Electronically Signed   By: Lajean Manes M.D.   On: 04/05/2018 14:02    Procedures Procedures (including critical care time)  Medications Ordered in ED Medications  fentaNYL (SUBLIMAZE) injection 50 mcg (50 mcg Intramuscular Given 04/05/18 1322)  HYDROmorphone (DILAUDID) injection 0.5 mg (0.5 mg Intravenous Given 04/05/18 1435)     Initial Impression / Assessment and Plan / ED Course  I have reviewed the triage vital signs and the nursing notes.  Pertinent labs & imaging results that were available during my care of the patient were reviewed by me and considered in my medical decision making (see chart for details).        83yF with R hip pain. Mechanical fall. Discussed With Dr Aline Brochure, orthopedic surgery. Pre-op testing obtained. Discussed with medical service for admission. [name] updated via phone.   Final Clinical Impressions(s) / ED Diagnoses   Final diagnoses:  Closed nondisplaced intertrochanteric fracture of right femur, initial encounter [facility])    ED Discharge Orders    None       Virgel Manifold, MD 04/07/18 1204

## 2018-04-05 NOTE — H&P (Signed)
TRH H&P   Patient Demographics:    [name], is a 84 y.o. female  MRN: [id number]   DOB - 1934/11/01  Admit Date - 04/05/2018  Outpatient Primary MD for the patient is [name], MD  Referring MD/NP/PA: Dr Wilson Singer  Outpatient Specialists: PCP: Dr Luan Pulling  Patient coming from: home  Chief Complaint  Patient presents with   Fall      HPI:    [name]  is a 84 y.o. female, has medical history of COPD, osteoporosis, GERD, patient presents to ED status post fall with right hip pain, patient reports she was going from her bedroom to the bathroom overnight, reports she did trip, fall in the right hip, denies any head trauma, she denies any dizziness, lightheadedness, loss of consciousness preceding this fall, patient was in ED 1 day before secondary to urinary retention, where she had Foley catheter inserted, patient reports history of COPD, but she denies any dyspnea, chest pain. - in ED hip x-ray significant for right hip fracture, CT head with no acute findings, she had UA was negative yesterday during ED visit, her chest x-ray with no acute findings, ED consulted orthopedic, who requested hospitalist to admit to evaluate for surgical repair.   Review of systems:    In addition to the HPI above, No Fever-chills, No Headache, No changes with Vision or hearing, No problems swallowing food or Liquids, No Chest pain, Cough or Shortness of Breath, No Abdominal pain, No Nausea or Vommitting, Bowel movements are regular, No Blood in stool or Urine, No dysuria, No new skin rashes or bruises, No new joints pains-aches,  No new weakness, tingling, numbness in any extremity, No recent weight gain or loss, No polyuria, polydypsia or polyphagia, No significant Mental Stressors.  A full 10 point Review of Systems was done, except as stated above, all other Review of Systems were  negative.   With Past History of the following :    Past Medical History:  Diagnosis Date   Anxiety    COPD (chronic obstructive pulmonary disease) ([place])    GERD (gastroesophageal reflux disease)    Hyperlipidemia    IBS (irritable bowel syndrome)    Osteoporosis       Past Surgical History:  Procedure Laterality Date   COLONOSCOPY  10/2009   sigmoid diverticula, multiple tubulovillous adneomas, needs surveillance Oct 2013   ESOPHAGOGASTRODUODENOSCOPY  04/16/2011   Dr. Gala Romney: Noncritical Schatzkis ring( not manipulated because no dysphagia). Normal esophagus otherwise  large hiatal hernia. Gastric polyp-status post biopsy. Gastric erosions-staus post biopsy. Abnormal bulb-status post biopsy. benign small bowel, stomach biopsy with ulcerated gastric antral mucosa with foveolar hyperplasia and surface erosion, polyp with inflamed gastric antral mucosa    ESOPHAGOGASTRODUODENOSCOPY N/A 04/29/2014   Dr. Gala Romney: Noncritical Schatzki's ring large hiatal hernia. Retained gastric contents.GES with slight delay      Social History:  Social History   Tobacco Use   Smoking status: Never Smoker   Smokeless tobacco: Current User    Types: Snuff  Substance Use Topics   Alcohol use: No     Lives - AT HOME     Family History :     Family History  Problem Relation Age of Onset   Stroke Mother    Diabetes Mother    Heart disease Father    Colon cancer Neg Hx      Home Medications:   Prior to Admission medications   Medication Sig Start Date End Date Taking? Authorizing Provider  albuterol (PROVENTIL) (2.5 MG/3ML) 0.083% nebulizer solution Take 2.5 mg by nebulization every 6 (six) hours as needed for wheezing or shortness of breath.   Yes [provider]  Albuterol Sulfate (PROAIR HFA IN) Inhale 2 puffs into the lungs every 4 (four) hours as needed (4-6 hours).   Yes [provider]  ALPRAZolam Duanne Moron) 1 MG tablet Take 1 mg by mouth 3  (three) times daily as needed for anxiety.  03/27/14  Yes [provider]  cetirizine (ZYRTEC) 10 MG tablet Take 10 mg by mouth daily.   Yes [provider]  furosemide (LASIX) 40 MG tablet Take 40 mg by mouth daily.   Yes [provider]  pantoprazole (PROTONIX) 40 MG tablet TAKE (1) TABLET BY MOUTH TWICE A DAY WITH MEALS (BREAKFAST AND SUPPER) Patient taking differently: Take 40 mg by mouth daily as needed.  10/04/17  Yes Annitta Needs, NP  potassium chloride (K-DUR) 10 MEQ tablet Take 10 mEq by mouth daily.   Yes [provider]  traMADol (ULTRAM) 50 MG tablet Take 100 mg by mouth every 4 (four) hours as needed.   Yes [provider]  umeclidinium-vilanterol (ANORO ELLIPTA) 62.5-25 MCG/INH AEPB Inhale 1 puff into the lungs daily.   Yes [provider]  VOLTAREN 1 % GEL Apply 1 g topically daily as needed. pain 07/01/14  Yes [provider]  zolpidem (AMBIEN) 10 MG tablet Take 5 mg by mouth at bedtime as needed for sleep.    Yes [provider]  ipratropium (ATROVENT HFA) 17 MCG/ACT inhaler Inhale 2 puffs into the lungs every 4 (four) hours as needed for wheezing. Patient not taking: Reported on 04/03/2018 02/17/18   Milton Ferguson, MD  predniSONE (DELTASONE) 10 MG tablet Take 2 tablets (20 mg total) by mouth daily. Patient not taking: Reported on 04/03/2018 02/17/18   Milton Ferguson, MD     Allergies:     Allergies  Allergen Reactions   Codeine Shortness Of Breath and Rash   Codeine Anaphylaxis   Levofloxacin Anaphylaxis    Patient was admitted to hospital with lung problems due to this medication   Sulfonamide Derivatives Hives and Shortness Of Breath   Sulfa Antibiotics Hives     Physical Exam:   Vitals  Blood pressure 124/82, pulse 87, temperature 97.8 F (36.6 C), temperature source Oral, resp. rate 12, height 5\' 2"  (1.575 m), weight 50.8 kg, SpO2 91 %.   1. General frail elderly female laying in bed in  no apparent distress  2. Normal affect and insight, Not Suicidal or Homicidal, Awake Alert, Oriented X 2.  3. No F.N deficits, ALL C.Nerves Intact, Strength 5/5 all 4 extremities, Sensation intact all 4 extremities, Plantars down going.  4. Ears and Eyes appear Normal, Conjunctivae clear, PERRLA. Moist Oral Mucosa.  5. Supple Neck, No JVD, No cervical lymphadenopathy appriciated, No Carotid Bruits.  6. Symmetrical Chest wall movement, Good air movement bilaterally, CTAB.  7. RRR, No Gallops, Rubs or Murmurs, No Parasternal Heave.  8. Positive Bowel Sounds, Abdomen Soft, No tenderness, No organomegaly appriciated,No rebound -guarding or rigidity.  9.  No Cyanosis, Normal Skin Turgor, No Skin Rash or Bruise.  10. Good muscle tone,  joints appear normal , no effusions, Normal ROM.  11. No Palpable Lymph Nodes in Neck or Axillae     Data Review:    CBC Recent Labs  Lab 04/03/18 1130 04/05/18 1439  WBC 10.2 13.2*  HGB 10.5* 11.5*  HCT 33.9* 36.6  PLT 416* 427*  MCV 86.5 85.1  MCH 26.8 26.7  MCHC 31.0 31.4  RDW 13.6 13.9  LYMPHSABS 0.7 0.8  MONOABS 1.3* 1.3*  EOSABS 0.2 0.1  BASOSABS 0.1 0.1   ------------------------------------------------------------------------------------------------------------------  Chemistries  Recent Labs  Lab 04/03/18 1130 04/05/18 1439  NA 132* 133*  K 4.3 4.3  CL 94* 91*  CO2 29 30  GLUCOSE 114* 117*  BUN 22 16  CREATININE 0.80 0.83  CALCIUM 9.0 8.8*  AST 17  --   ALT 9  --   ALKPHOS 91  --   BILITOT 0.7  --    ------------------------------------------------------------------------------------------------------------------ estimated creatinine clearance is 42 mL/min (by C-G formula based on SCr of 0.83 mg/dL). ------------------------------------------------------------------------------------------------------------------ No results for input(s): TSH, T4TOTAL, T3FREE, THYROIDAB in the last 72 hours.  Invalid input(s):  FREET3  Coagulation profile No results for input(s): INR, PROTIME in the last 168 hours. ------------------------------------------------------------------------------------------------------------------- No results for input(s): DDIMER in the last 72 hours. -------------------------------------------------------------------------------------------------------------------  Cardiac Enzymes No results for input(s): CKMB, TROPONINI, MYOGLOBIN in the last 168 hours.  Invalid input(s): CK ------------------------------------------------------------------------------------------------------------------ No results found for: BNP   ---------------------------------------------------------------------------------------------------------------  Urinalysis    Component Value Date/Time   COLORURINE YELLOW 04/03/2018 1107   APPEARANCEUR CLEAR 04/03/2018 1107   LABSPEC 1.013 04/03/2018 1107   PHURINE 5.0 04/03/2018 1107   GLUCOSEU NEGATIVE 04/03/2018 1107   HGBUR NEGATIVE 04/03/2018 1107   BILIRUBINUR NEGATIVE 04/03/2018 1107   KETONESUR NEGATIVE 04/03/2018 1107   PROTEINUR NEGATIVE 04/03/2018 1107   UROBILINOGEN 0.2 03/01/2012 2011   NITRITE NEGATIVE 04/03/2018 1107   LEUKOCYTESUR TRACE (A) 04/03/2018 1107    ----------------------------------------------------------------------------------------------------------------   Imaging Results:    Dg Lumbar Spine Complete  Result Date: 04/05/2018 CLINICAL DATA:  84 year old female with right hip and lower back pain after falling at home earlier today EXAM: LUMBAR SPINE - COMPLETE 4+ VIEW COMPARISON:  Prior CT scan of the abdomen and pelvis 04/03/2018 FINDINGS: Rotary and dextroconvex scoliosis centered at L3. The bones are diffusely osteopenic in appearance. Radiographs are extremely limited secondary to low bone density. Visualized bowel gas pattern is unremarkable. IMPRESSION: The bones are significantly demineralized which severely limits the  diagnostic accuracy of this examination, particularly in light of underlying scoliosis. If there is persistent clinical concern for lumbar spinal fracture, recommend further evaluation with MRI. Rotary and dextroconvex scoliosis centered at L3. Electronically Signed   By: Jacqulynn Cadet M.D.   On: 04/05/2018 14:04   Ct Head Wo Contrast  Result Date: 04/03/2018 CLINICAL DATA:  Confusion, acute, unexplained. EXAM: CT HEAD WITHOUT CONTRAST TECHNIQUE: Contiguous axial images were obtained from the base of the skull through the vertex without intravenous contrast. COMPARISON:  None. FINDINGS: Brain: Advanced atrophy and diffuse white matter disease is present. No acute cortical infarct is present. Basal ganglia are intact. Brainstem and cerebellum are within normal limits. The ventricles are of proportionate  to the degree of atrophy. No significant extraaxial fluid collection is present. Vascular: Atherosclerotic changes are present within the cavernous internal carotid arteries. There is no asymmetric hyperdense vessel. Skull: Calvarium is intact. No focal lytic or blastic lesions are present. Sinuses/Orbits: The paranasal sinuses and mastoid air cells are clear. The globes and orbits are within normal limits. IMPRESSION: 1. No acute intracranial abnormality. 2. Extensive white matter disease. This likely reflects the sequela of chronic microvascular ischemia. Electronically Signed   By: San Morelle M.D.   On: 04/03/2018 16:50   Dg Chest Port 1 View  Result Date: 04/05/2018 CLINICAL DATA:  Preop for hip surgery. EXAM: PORTABLE CHEST 1 VIEW COMPARISON:  02/17/2018 FINDINGS: Cardiac silhouette is normal in size. Moderate hiatal hernia. No mediastinal or hilar masses. Prominent bronchovascular and interstitial markings bilaterally similar to prior exams. Additional left lung base opacity is most likely atelectasis. No convincing pneumonia or pulmonary edema. No visualized pleural effusion and no  pneumothorax. Skeletal structures are demineralized but grossly intact IMPRESSION: No acute cardiopulmonary disease. Electronically Signed   By: Lajean Manes M.D.   On: 04/05/2018 14:28   Dg Hip Unilat W Or Wo Pelvis 2-3 Views Right  Result Date: 04/05/2018 CLINICAL DATA:  Pt states she fell at home onset today and is c/o right hip pain and lower back pain. Pt denies any surgery to lower back or right hip. EXAM: DG HIP (WITH OR WITHOUT PELVIS) 2-3V RIGHT COMPARISON:  None. FINDINGS: There is a nondisplaced, non comminuted subcapital fracture of the right femoral neck with mild valgus angulation. No other fractures.  No bone lesions. Hip joints, SI joints and pubic symphysis are normally spaced and aligned. Skeletal structures are demineralized. Mild subcutaneous soft tissue edema lateral to the proximal right femur. IMPRESSION: 1. Nondisplaced, non comminuted subcapital fracture of the right femoral neck with mild valgus angulation. Electronically Signed   By: Lajean Manes M.D.   On: 04/05/2018 14:02    My personal review of EKG: Rhythm NSR, Rate  90 /min, QTc 453 , no Acute ST changes   Assessment & Plan:    Active Problems:   Osteoporosis   Dyspepsia   Closed right hip fracture (HCC)   GERD (gastroesophageal reflux disease)   COPD (chronic obstructive pulmonary disease) (HCC)   Right Hip fracture: -Patient reports mechanical fall, denies any loss of consciousness, dizziness or lightheadedness with her fall, -She sustained right hip fracture, await further recommendation from orthopedic, no acute findings on EKG, no evidence of volume overload, no further work-up indicated before surgery. -Patient with known history of COPD, but no active wheezing, will encourage use incentive spirometry and flutter valve perioperatively, will continue with the scheduled duo nebs, will try to ambulate early after surgery, -Given her allergy will keep on PRN Dilaudid for pain, will keep on SCD for DVT  prophylaxis, will start on Lovenox after surgery.  COPD -Continue with home meds including Brio Ellipta, PRN albuterol, and scheduled duo nebs, will encourage to use incentive spirometry  Osteoporosis -As evident by her history, and imaging, will start on calcium with vitamin D  Urinary retention -Noted through ED visit yesterday, continue with Foley catheter, voiding trial during hospital stay  GERD -Continue with PPI  Anxiety -Continue with PRN Xanax  Patient appears to be on Lasix, which I will hold currently given 2D echo August 2019, with a preserved EF, and she does appear to be euvolemic today  DVT Prophylaxis  AM Labs Ordered, also please review Full  Orders  Family Communication: Admission, patients condition and plan of care including tests being ordered have been discussed with the patient and daughter  who indicate understanding and agree with the plan and Code Status.  Code Status Full  Likely DC to  SNF  Condition GUARDED    Consults called:  Ortho by ED  Admission status: Inpatient  Time spent in minutes : 55 minutes   Phillips Climes M.D on 04/05/2018 at 4:34 PM  Between 7am to 7pm - Pager - [phone]. After 7pm go to [email or web address] - password [facility]  [facility] - Office  [phone]

## 2018-04-05 NOTE — Progress Notes (Signed)
Applied o2 @ 2lpm cann after treatment due to decreased spo2 before treatment of 85-87% on room air

## 2018-04-05 NOTE — H&P (View-Only) (Signed)
HOSPITAL CONSULT (HIP FRACTURE )    Patient ID: [name], female   DOB: [birth year], 84 y.o.   MRN: [id number]  New patient  Requested by: Dr Lum Keas  Reason for: pain right hip  Chief Complaint  Patient presents with  . Fall     [name] is a 84 y.o. female.    HPI  I fell last night  Pain  Location right hip Duration12 hrs  Severity 10 Quality sharp Modified by worse with movement   Review of Systems (all) Review of Systems  Unable to perform ROS: Acuity of condition    Past Medical History:  Diagnosis Date  . Anxiety   . COPD (chronic obstructive pulmonary disease) ([place])   . GERD (gastroesophageal reflux disease)   . Hyperlipidemia   . IBS (irritable bowel syndrome)   . Osteoporosis     Past Surgical History:  Procedure Laterality Date  . COLONOSCOPY  10/2009   sigmoid diverticula, multiple tubulovillous adneomas, needs surveillance Oct 2013  . ESOPHAGOGASTRODUODENOSCOPY  04/16/2011   Dr. Gala Romney: Noncritical Schatzkis ring( not manipulated because no dysphagia). Normal esophagus otherwise  large hiatal hernia. Gastric polyp-status post biopsy. Gastric erosions-staus post biopsy. Abnormal bulb-status post biopsy. benign small bowel, stomach biopsy with ulcerated gastric antral mucosa with foveolar hyperplasia and surface erosion, polyp with inflamed gastric antral mucosa   . ESOPHAGOGASTRODUODENOSCOPY N/A 04/29/2014   Dr. Gala Romney: Noncritical Schatzki's ring large hiatal hernia. Retained gastric contents.GES with slight delay    Family History  Problem Relation Age of Onset  . Stroke Mother   . Diabetes Mother   . Heart disease Father   . Colon cancer Neg Hx    Social History Social History   Tobacco Use  . Smoking status: Never Smoker  . Smokeless tobacco: Current User    Types: Snuff  Substance Use Topics  . Alcohol use: No  . Drug use: No     Allergies  Allergen Reactions  . Codeine Shortness Of Breath and Rash  . Codeine Anaphylaxis   . Levofloxacin Anaphylaxis    Patient was admitted to hospital with lung problems due to this medication  . Sulfonamide Derivatives Hives and Shortness Of Breath  . Sulfa Antibiotics Hives    Current Facility-Administered Medications  Medication Dose Route Frequency Provider Last Rate Last Dose  . albuterol (PROVENTIL) (2.5 MG/3ML) 0.083% nebulizer solution 2.5 mg  2.5 mg Nebulization Q6H PRN Elgergawy, Silver Huguenin, MD      . ALPRAZolam Duanne Moron) tablet 0.5 mg  0.5 mg Oral TID PRN Elgergawy, Silver Huguenin, MD      . calcium-vitamin D (OSCAL WITH D) 500-200 MG-UNIT per tablet 1 tablet  1 tablet Oral TID Elgergawy, Silver Huguenin, MD      . docusate sodium (COLACE) capsule 100 mg  100 mg Oral BID Elgergawy, Silver Huguenin, MD      . HYDROmorphone (DILAUDID) injection 0.25-0.5 mg  0.25-0.5 mg Intravenous Q4H PRN Elgergawy, Silver Huguenin, MD      . ipratropium-albuterol (DUONEB) 0.5-2.5 (3) MG/3ML nebulizer solution 3 mL  3 mL Nebulization Q6H Elgergawy, Silver Huguenin, MD   3 mL at 04/05/18 1947  . methocarbamol (ROBAXIN) tablet 500 mg  500 mg Oral Q6H PRN Elgergawy, Silver Huguenin, MD       Or  . methocarbamol (ROBAXIN) 500 mg in dextrose 5 % 50 mL IVPB  500 mg Intravenous Q6H PRN Elgergawy, Silver Huguenin, MD      . pantoprazole (PROTONIX) EC tablet 40 mg  40 mg Oral Daily Elgergawy, Silver Huguenin, MD      . Derrill Memo ON 04/06/2018] umeclidinium-vilanterol (ANORO ELLIPTA) 62.5-25 MCG/INH 1 puff  1 puff Inhalation Daily Elgergawy, Silver Huguenin, MD      . zolpidem (AMBIEN) tablet 5 mg  5 mg Oral QHS PRN Elgergawy, Silver Huguenin, MD         Physical Exam(=30) BP 116/85 (BP Location: Left Arm)   Pulse (!) 104   Temp 98.1 F (36.7 C) (Oral)   Resp 20   Ht 5\' 3"  (1.6 m)   Wt 49.7 kg   SpO2 98%   BMI 19.41 kg/m   Gen. Appearance small frame  Peripheral vascular system normal no edema  Lymph nodes none in the groin  Gait can not walk   Left Upper extremity  Inspection revealed no malalignment or asymmetry  Assessment of range of motion: Full  range of motion was recorded  Assessment of stability: Elbow wrist and hand and shoulder were stable  Assessment of muscle strength and tone revealed grade 5 muscle strength and normal muscle tone  Skin was normal without rash lesion or ulceration  Right upper extremity  Inspection revealed no malalignment or asymmetry  Assessment of range of motion: Full range of motion was recorded  Assessment of stability: Elbow wrist and hand and shoulder were stable  Assessment of muscle strength and tone revealed grade 5 muscle strength and normal muscle tone  Skin was normal without rash lesion or ulceration  Right Lower extremity  Inspection revealed no malalignment or asymmetry  Assessment of range of motion: pain with  range of motion was recorded  Assessment of stability: Ankle, knee  were stable  Assessment of muscle strength and tone revealed grade 5 muscle strength foot and ankle and normal muscle tone entire leg   Skin was normal without rash lesion or ulceration  Left lower extremity  Inspection revealed no malalignment or asymmetry  Assessment of range of motion: Full range of motion was recorded  Assessment of stability: Ankle, knee and hip were stable  Assessment of muscle strength and tone revealed grade 5 muscle strength and normal muscle tone  Skin was normal without rash lesion or ulceration   Coordination was tested by finger-to-nose nose and was normal Deep tendon reflexes were 2+ in the upper extremities  Examination of sensation by touch was normal  Mental status  Oriented to  person and place normal, time disoriented (medicated)   Mood and affect normal without depression anxiety or agitation  Dx:   Data Reviewed  I reviewed the following images and the reports and my independent interpretation is subcapital fracture right hip    Assessment  Right hip garden I/II right hip fracture   Plan  OTIF RIGHT HIP FRACTURE CANNULATED SCREWS    Carole Civil MD

## 2018-04-05 NOTE — Consult Note (Signed)
HOSPITAL CONSULT (HIP FRACTURE )    Patient ID: [name], female   DOB: Dec 27, 1934, 84 y.o.   MRN: [id number]  New patient  Requested by: Dr Lum Keas  Reason for: pain right hip  Chief Complaint  Patient presents with  . Fall     [name] is a 84 y.o. female.    HPI  I fell last night  Pain  Location right hip Duration12 hrs  Severity 10 Quality sharp Modified by worse with movement   Review of Systems (all) Review of Systems  Unable to perform ROS: Acuity of condition    Past Medical History:  Diagnosis Date  . Anxiety   . COPD (chronic obstructive pulmonary disease) ([place])   . GERD (gastroesophageal reflux disease)   . Hyperlipidemia   . IBS (irritable bowel syndrome)   . Osteoporosis     Past Surgical History:  Procedure Laterality Date  . COLONOSCOPY  10/2009   sigmoid diverticula, multiple tubulovillous adneomas, needs surveillance Oct 2013  . ESOPHAGOGASTRODUODENOSCOPY  04/16/2011   Dr. Gala Romney: Noncritical Schatzkis ring( not manipulated because no dysphagia). Normal esophagus otherwise  large hiatal hernia. Gastric polyp-status post biopsy. Gastric erosions-staus post biopsy. Abnormal bulb-status post biopsy. benign small bowel, stomach biopsy with ulcerated gastric antral mucosa with foveolar hyperplasia and surface erosion, polyp with inflamed gastric antral mucosa   . ESOPHAGOGASTRODUODENOSCOPY N/A 04/29/2014   Dr. Gala Romney: Noncritical Schatzki's ring large hiatal hernia. Retained gastric contents.GES with slight delay    Family History  Problem Relation Age of Onset  . Stroke Mother   . Diabetes Mother   . Heart disease Father   . Colon cancer Neg Hx    Social History Social History   Tobacco Use  . Smoking status: Never Smoker  . Smokeless tobacco: Current User    Types: Snuff  Substance Use Topics  . Alcohol use: No  . Drug use: No     Allergies  Allergen Reactions  . Codeine Shortness Of Breath and Rash  . Codeine Anaphylaxis   . Levofloxacin Anaphylaxis    Patient was admitted to hospital with lung problems due to this medication  . Sulfonamide Derivatives Hives and Shortness Of Breath  . Sulfa Antibiotics Hives    Current Facility-Administered Medications  Medication Dose Route Frequency Provider Last Rate Last Dose  . albuterol (PROVENTIL) (2.5 MG/3ML) 0.083% nebulizer solution 2.5 mg  2.5 mg Nebulization Q6H PRN Elgergawy, Silver Huguenin, MD      . ALPRAZolam Duanne Moron) tablet 0.5 mg  0.5 mg Oral TID PRN Elgergawy, Silver Huguenin, MD      . calcium-vitamin D (OSCAL WITH D) 500-200 MG-UNIT per tablet 1 tablet  1 tablet Oral TID Elgergawy, Silver Huguenin, MD      . docusate sodium (COLACE) capsule 100 mg  100 mg Oral BID Elgergawy, Silver Huguenin, MD      . HYDROmorphone (DILAUDID) injection 0.25-0.5 mg  0.25-0.5 mg Intravenous Q4H PRN Elgergawy, Silver Huguenin, MD      . ipratropium-albuterol (DUONEB) 0.5-2.5 (3) MG/3ML nebulizer solution 3 mL  3 mL Nebulization Q6H Elgergawy, Silver Huguenin, MD   3 mL at 04/05/18 1947  . methocarbamol (ROBAXIN) tablet 500 mg  500 mg Oral Q6H PRN Elgergawy, Silver Huguenin, MD       Or  . methocarbamol (ROBAXIN) 500 mg in dextrose 5 % 50 mL IVPB  500 mg Intravenous Q6H PRN Elgergawy, Silver Huguenin, MD      . pantoprazole (PROTONIX) EC tablet 40 mg  40 mg Oral Daily Elgergawy, Silver Huguenin, MD      . Derrill Memo ON 04/06/2018] umeclidinium-vilanterol (ANORO ELLIPTA) 62.5-25 MCG/INH 1 puff  1 puff Inhalation Daily Elgergawy, Silver Huguenin, MD      . zolpidem (AMBIEN) tablet 5 mg  5 mg Oral QHS PRN Elgergawy, Silver Huguenin, MD         Physical Exam(=30) BP 116/85 (BP Location: Left Arm)   Pulse (!) 104   Temp 98.1 F (36.7 C) (Oral)   Resp 20   Ht 5\' 3"  (1.6 m)   Wt 49.7 kg   SpO2 98%   BMI 19.41 kg/m   Gen. Appearance small frame  Peripheral vascular system normal no edema  Lymph nodes none in the groin  Gait can not walk   Left Upper extremity  Inspection revealed no malalignment or asymmetry  Assessment of range of motion: Full  range of motion was recorded  Assessment of stability: Elbow wrist and hand and shoulder were stable  Assessment of muscle strength and tone revealed grade 5 muscle strength and normal muscle tone  Skin was normal without rash lesion or ulceration  Right upper extremity  Inspection revealed no malalignment or asymmetry  Assessment of range of motion: Full range of motion was recorded  Assessment of stability: Elbow wrist and hand and shoulder were stable  Assessment of muscle strength and tone revealed grade 5 muscle strength and normal muscle tone  Skin was normal without rash lesion or ulceration  Right Lower extremity  Inspection revealed no malalignment or asymmetry  Assessment of range of motion: pain with  range of motion was recorded  Assessment of stability: Ankle, knee  were stable  Assessment of muscle strength and tone revealed grade 5 muscle strength foot and ankle and normal muscle tone entire leg   Skin was normal without rash lesion or ulceration  Left lower extremity  Inspection revealed no malalignment or asymmetry  Assessment of range of motion: Full range of motion was recorded  Assessment of stability: Ankle, knee and hip were stable  Assessment of muscle strength and tone revealed grade 5 muscle strength and normal muscle tone  Skin was normal without rash lesion or ulceration   Coordination was tested by finger-to-nose nose and was normal Deep tendon reflexes were 2+ in the upper extremities  Examination of sensation by touch was normal  Mental status  Oriented to  person and place normal, time disoriented (medicated)   Mood and affect normal without depression anxiety or agitation  Dx:   Data Reviewed  I reviewed the following images and the reports and my independent interpretation is subcapital fracture right hip    Assessment  Right hip garden I/II right hip fracture   Plan  OTIF RIGHT HIP FRACTURE CANNULATED SCREWS    Carole Civil MD

## 2018-04-05 NOTE — ED Notes (Signed)
ED TO INPATIENT HANDOFF REPORT  ED Nurse Name and Phone #: Jamey Ripa [phone]  S Name/Age/Gender [name] 84 y.o. female Room/Bed: APA09/APA09  Code Status   Code Status: Full Code  Home/SNF/Other Home Patient oriented to: self Is this baseline? Yes   Triage Complete: Triage complete  Chief Complaint hip pain  Triage Note Pt fell at home, denies hitting her head. C/o of left hip pain.    Allergies Allergies  Allergen Reactions  . Codeine Shortness Of Breath and Rash  . Codeine Anaphylaxis  . Levofloxacin Anaphylaxis    Patient was admitted to hospital with lung problems due to this medication  . Sulfonamide Derivatives Hives and Shortness Of Breath  . Sulfa Antibiotics Hives    Level of Care/Admitting Diagnosis ED Disposition    ED Disposition Condition [facility] Area: [facility] [[id number]]  Level of Care: Med-Surg [16]  Diagnosis: Closed right hip fracture [facility]) [[id number]]  Admitting Physician: Manfred Shirts  Attending Physician: Waldron Labs, DAWOOD S [[id number]]  Estimated length of stay: 3 - 4 days  Certification:: I certify this patient will need inpatient services for at least 2 midnights  PT Class (Do Not Modify): Inpatient [101]  PT Acc Code (Do Not Modify): Private [1]       B Medical/Surgery History Past Medical History:  Diagnosis Date  . Anxiety   . COPD (chronic obstructive pulmonary disease) ([place])   . GERD (gastroesophageal reflux disease)   . Hyperlipidemia   . IBS (irritable bowel syndrome)   . Osteoporosis    Past Surgical History:  Procedure Laterality Date  . COLONOSCOPY  10/2009   sigmoid diverticula, multiple tubulovillous adneomas, needs surveillance Oct 2013  . ESOPHAGOGASTRODUODENOSCOPY  04/16/2011   Dr. Gala Romney: Noncritical Schatzkis ring( not manipulated because no dysphagia). Normal esophagus otherwise  large hiatal hernia. Gastric polyp-status post biopsy. Gastric erosions-staus post biopsy.  Abnormal bulb-status post biopsy. benign small bowel, stomach biopsy with ulcerated gastric antral mucosa with foveolar hyperplasia and surface erosion, polyp with inflamed gastric antral mucosa   . ESOPHAGOGASTRODUODENOSCOPY N/A 04/29/2014   Dr. Gala Romney: Noncritical Schatzki's ring large hiatal hernia. Retained gastric contents.GES with slight delay     A IV Location/Drains/Wounds Patient Lines/Drains/Airways Status   Active Line/Drains/Airways    Name:   Placement date:   Placement time:   Site:   Days:   Peripheral IV 04/05/18 Right Forearm   04/05/18    1434    Forearm   less than 1   Urethral Catheter Mosie Lukes NT Latex 14 Fr.   04/03/18    1107    Latex   2          Intake/Output Last 24 hours No intake or output data in the 24 hours ending 04/05/18 1648  Labs/Imaging Results for orders placed or performed during the hospital encounter of 04/05/18 (from the past 48 hour(s))  CBC with Differential     Status: Abnormal   Collection Time: 04/05/18  2:39 PM  Result Value Ref Range   WBC 13.2 (H) 4.0 - 10.5 K/uL   RBC 4.30 3.87 - 5.11 MIL/uL   Hemoglobin 11.5 (L) 12.0 - 15.0 g/dL   HCT 36.6 36.0 - 46.0 %   MCV 85.1 80.0 - 100.0 fL   MCH 26.7 26.0 - 34.0 pg   MCHC 31.4 30.0 - 36.0 g/dL   RDW 13.9 11.5 - 15.5 %   Platelets 427 (H) 150 - 400 K/uL   nRBC  0.0 0.0 - 0.2 %   Neutrophils Relative % 81 %   Neutro Abs 10.8 (H) 1.7 - 7.7 K/uL   Lymphocytes Relative 6 %   Lymphs Abs 0.8 0.7 - 4.0 K/uL   Monocytes Relative 10 %   Monocytes Absolute 1.3 (H) 0.1 - 1.0 K/uL   Eosinophils Relative 1 %   Eosinophils Absolute 0.1 0.0 - 0.5 K/uL   Basophils Relative 1 %   Basophils Absolute 0.1 0.0 - 0.1 K/uL   Immature Granulocytes 1 %   Abs Immature Granulocytes 0.08 (H) 0.00 - 0.07 K/uL    Comment: Performed at [facility], [address], [place], [place] [address]  Basic metabolic panel     Status: Abnormal   Collection Time: 04/05/18  2:39 PM  Result Value Ref Range   Sodium 133  (L) 135 - 145 mmol/L   Potassium 4.3 3.5 - 5.1 mmol/L   Chloride 91 (L) 98 - 111 mmol/L   CO2 30 22 - 32 mmol/L   Glucose, Bld 117 (H) 70 - 99 mg/dL   BUN 16 8 - 23 mg/dL   Creatinine, Ser 0.83 0.44 - 1.00 mg/dL   Calcium 8.8 (L) 8.9 - 10.3 mg/dL   GFR calc non Af Amer >60 >60 mL/min   GFR calc Af Amer >60 >60 mL/min   Anion gap 12 5 - 15    Comment: Performed at [facility], [name], [place], [place] [address]   Dg Lumbar Spine Complete  Result Date: 04/05/2018 CLINICAL DATA:  84 year old female with right hip and lower back pain after falling at home earlier today EXAM: LUMBAR SPINE - COMPLETE 4+ VIEW COMPARISON:  Prior CT scan of the abdomen and pelvis 04/03/2018 FINDINGS: Rotary and dextroconvex scoliosis centered at L3. The bones are diffusely osteopenic in appearance. Radiographs are extremely limited secondary to low bone density. Visualized bowel gas pattern is unremarkable. IMPRESSION: The bones are significantly demineralized which severely limits the diagnostic accuracy of this examination, particularly in light of underlying scoliosis. If there is persistent clinical concern for lumbar spinal fracture, recommend further evaluation with MRI. Rotary and dextroconvex scoliosis centered at L3. Electronically Signed   By: Jacqulynn Cadet M.D.   On: 04/05/2018 14:04   Dg Chest Port 1 View  Result Date: 04/05/2018 CLINICAL DATA:  Preop for hip surgery. EXAM: PORTABLE CHEST 1 VIEW COMPARISON:  02/17/2018 FINDINGS: Cardiac silhouette is normal in size. Moderate hiatal hernia. No mediastinal or hilar masses. Prominent bronchovascular and interstitial markings bilaterally similar to prior exams. Additional left lung base opacity is most likely atelectasis. No convincing pneumonia or pulmonary edema. No visualized pleural effusion and no pneumothorax. Skeletal structures are demineralized but grossly intact IMPRESSION: No acute cardiopulmonary disease. Electronically Signed   By: Lajean Manes M.D.   On: 04/05/2018 14:28   Dg Hip Unilat W Or Wo Pelvis 2-3 Views Right  Result Date: 04/05/2018 CLINICAL DATA:  Pt states she fell at home onset today and is c/o right hip pain and lower back pain. Pt denies any surgery to lower back or right hip. EXAM: DG HIP (WITH OR WITHOUT PELVIS) 2-3V RIGHT COMPARISON:  None. FINDINGS: There is a nondisplaced, non comminuted subcapital fracture of the right femoral neck with mild valgus angulation. No other fractures.  No bone lesions. Hip joints, SI joints and pubic symphysis are normally spaced and aligned. Skeletal structures are demineralized. Mild subcutaneous soft tissue edema lateral to the proximal right femur. IMPRESSION: 1. Nondisplaced, non comminuted  subcapital fracture of the right femoral neck with mild valgus angulation. Electronically Signed   By: Lajean Manes M.D.   On: 04/05/2018 14:02    Pending Labs Unresulted Labs (From admission, onward)    Start     Ordered   04/06/18 0500  CBC  Tomorrow morning,   R     04/05/18 1618   04/06/18 7939  Basic metabolic panel  Tomorrow morning,   R     04/05/18 1618          Vitals/Pain Today's Vitals   04/05/18 1400 04/05/18 1430 04/05/18 1500 04/05/18 1530  BP: 120/78 130/84 113/76 124/82  Pulse: 87     Resp:   16 12  Temp:      TempSrc:      SpO2: 91%     Weight:      Height:      PainSc:        Isolation Precautions No active isolations  Medications Medications  albuterol (PROVENTIL) (2.5 MG/3ML) 0.083% nebulizer solution 2.5 mg (has no administration in time range)  ALPRAZolam (XANAX) tablet 0.5 mg (has no administration in time range)  pantoprazole (PROTONIX) EC tablet 40 mg (has no administration in time range)  umeclidinium-vilanterol (ANORO ELLIPTA) 62.5-25 MCG/INH 1 puff (has no administration in time range)  zolpidem (AMBIEN) tablet 5 mg (has no administration in time range)  ipratropium-albuterol (DUONEB) 0.5-2.5 (3) MG/3ML nebulizer solution 3 mL (has no  administration in time range)  calcium-vitamin D (OSCAL WITH D) 500-200 MG-UNIT per tablet 1 tablet (has no administration in time range)  fentaNYL (SUBLIMAZE) injection 50 mcg (50 mcg Intramuscular Given 04/05/18 1322)  HYDROmorphone (DILAUDID) injection 0.5 mg (0.5 mg Intravenous Given 04/05/18 1435)    Mobility non-ambulatory High fall risk   Focused Assessments    R Recommendations: See Admitting Provider Note  Report given to:   Additional Notes:

## 2018-04-05 NOTE — Progress Notes (Signed)
Patient ID: [name], female   DOB: Jan 10, 1934, 84 y.o.   MRN: [id number] Chief Complaint  Patient presents with  . Fall   Right hip  CBC Latest Ref Rng & Units 04/05/2018 04/03/2018 02/17/2018  WBC 4.0 - 10.5 K/uL 13.2(H) 10.2 9.5  Hemoglobin 12.0 - 15.0 g/dL 11.5(L) 10.5(L) 11.2(L)  Hematocrit 36.0 - 46.0 % 36.6 33.9(L) 37.6  Platelets 150 - 400 K/uL 427(H) 416(H) 293    BMP Latest Ref Rng & Units 04/03/2018 02/17/2018 03/01/2012  Glucose 70 - 99 mg/dL 114(H) 121(H) 84  BUN 8 - 23 mg/dL 22 12 8   Creatinine 0.44 - 1.00 mg/dL 0.80 0.77 0.72  Sodium 135 - 145 mmol/L 132(L) 138 137  Potassium 3.5 - 5.1 mmol/L 4.3 4.4 3.6  Chloride 98 - 111 mmol/L 94(L) 105 100  CO2 22 - 32 mmol/L 29 24 30   Calcium 8.9 - 10.3 mg/dL 9.0 8.9 9.4    Right hip

## 2018-04-06 ENCOUNTER — Inpatient Hospital Stay (HOSPITAL_COMMUNITY): Payer: Medicare Other

## 2018-04-06 ENCOUNTER — Inpatient Hospital Stay (HOSPITAL_COMMUNITY): Payer: Medicare Other | Admitting: Anesthesiology

## 2018-04-06 ENCOUNTER — Encounter (HOSPITAL_COMMUNITY)
Admission: EM | Disposition: A | Discharge status: Home Health | Payer: Self-pay | Source: Home / Self Care | Attending: Pulmonary Disease

## 2018-04-06 ENCOUNTER — Encounter (HOSPITAL_COMMUNITY): Payer: Self-pay | Admitting: *Deleted

## 2018-04-06 HISTORY — PX: HIP PINNING,CANNULATED: SHX1758

## 2018-04-06 LAB — CBC
HCT: 31 % — ABNORMAL LOW (ref 36.0–46.0)
Hemoglobin: 9.7 g/dL — ABNORMAL LOW (ref 12.0–15.0)
MCH: 26.7 pg (ref 26.0–34.0)
MCHC: 31.3 g/dL (ref 30.0–36.0)
MCV: 85.4 fL (ref 80.0–100.0)
Platelets: 382 10*3/uL (ref 150–400)
RBC: 3.63 MIL/uL — ABNORMAL LOW (ref 3.87–5.11)
RDW: 13.9 % (ref 11.5–15.5)
WBC: 13.6 10*3/uL — ABNORMAL HIGH (ref 4.0–10.5)
nRBC: 0 % (ref 0.0–0.2)

## 2018-04-06 LAB — BASIC METABOLIC PANEL
Anion gap: 12 (ref 5–15)
BUN: 18 mg/dL (ref 8–23)
CO2: 29 mmol/L (ref 22–32)
Calcium: 8.5 mg/dL — ABNORMAL LOW (ref 8.9–10.3)
Chloride: 94 mmol/L — ABNORMAL LOW (ref 98–111)
Creatinine, Ser: 0.85 mg/dL (ref 0.44–1.00)
GFR calc Af Amer: >60 mL/min (ref >60)
GFR calc non Af Amer: >60 mL/min (ref >60)
Glucose, Bld: 109 mg/dL — ABNORMAL HIGH (ref 70–99)
Potassium: 4.1 mmol/L (ref 3.5–5.1)
Sodium: 135 mmol/L (ref 135–145)

## 2018-04-06 LAB — SURGICAL PCR SCREEN
MRSA, PCR: NEGATIVE
Staphylococcus aureus: NEGATIVE

## 2018-04-06 SURGERY — FIXATION, FEMUR, NECK, PERCUTANEOUS, USING SCREW
Anesthesia: General | Site: Hip | Laterality: Right

## 2018-04-06 MED ORDER — LACTATED RINGERS IV SOLN
INTRAVENOUS | Status: DC | PRN
  Administered 2018-04-06: 08:00:00 via INTRAVENOUS

## 2018-04-06 MED ORDER — BUPIVACAINE-EPINEPHRINE (PF) 0.5% -1:200000 IJ SOLN
INTRAMUSCULAR | Status: AC
  Filled 2018-04-06: qty 60

## 2018-04-06 MED ORDER — SODIUM CHLORIDE 0.9 % IV SOLN
INTRAVENOUS | Status: DC
  Administered 2018-04-06: 11:00:00 via INTRAVENOUS

## 2018-04-06 MED ORDER — CEFAZOLIN SODIUM-DEXTROSE 2-4 GM/100ML-% IV SOLN
2.0000 g | INTRAVENOUS | Status: AC
  Administered 2018-04-06: 08:00:00 2 g via INTRAVENOUS

## 2018-04-06 MED ORDER — PHENOL 1.4 % MT LIQD: 1.0000 | OROMUCOSAL | Status: DC | PRN

## 2018-04-06 MED ORDER — TRAMADOL HCL 50 MG PO TABS
50.0000 mg | ORAL_TABLET | Freq: Once | ORAL | Status: AC
  Administered 2018-04-06: 10:00:00 50 mg via ORAL

## 2018-04-06 MED ORDER — GLYCOPYRROLATE PF 0.2 MG/ML IJ SOSY
PREFILLED_SYRINGE | INTRAMUSCULAR | Status: AC
  Filled 2018-04-06: qty 1

## 2018-04-06 MED ORDER — ACETAMINOPHEN 10 MG/ML IV SOLN
750.0000 mg | Freq: Once | INTRAVENOUS | Status: AC
  Administered 2018-04-06: 10:00:00 750 mg via INTRAVENOUS

## 2018-04-06 MED ORDER — ACETAMINOPHEN 10 MG/ML IV SOLN
750.0000 mg | Freq: Four times a day (QID) | INTRAVENOUS | Status: AC
  Administered 2018-04-06 – 2018-04-07 (×3): 750 mg via INTRAVENOUS
  Filled 2018-04-06 (×3): qty 75

## 2018-04-06 MED ORDER — PROPOFOL 10 MG/ML IV BOLUS
INTRAVENOUS | Status: AC
  Filled 2018-04-06: qty 40

## 2018-04-06 MED ORDER — FENTANYL CITRATE (PF) 100 MCG/2ML IJ SOLN
INTRAMUSCULAR | Status: AC
  Filled 2018-04-06: qty 2

## 2018-04-06 MED ORDER — LACTATED RINGERS IV SOLN: INTRAVENOUS | Status: DC

## 2018-04-06 MED ORDER — LIDOCAINE 2% (20 MG/ML) 5 ML SYRINGE
INTRAMUSCULAR | Status: AC
  Filled 2018-04-06: qty 5

## 2018-04-06 MED ORDER — 0.9 % SODIUM CHLORIDE (POUR BTL) OPTIME
TOPICAL | Status: DC | PRN
  Administered 2018-04-06: 1000 mL

## 2018-04-06 MED ORDER — ONDANSETRON HCL 4 MG/2ML IJ SOLN: 4.0000 mg | Freq: Four times a day (QID) | INTRAMUSCULAR | Status: DC | PRN

## 2018-04-06 MED ORDER — SUGAMMADEX SODIUM 200 MG/2ML IV SOLN
INTRAVENOUS | Status: DC | PRN
  Administered 2018-04-06: 99.4 mg via INTRAVENOUS

## 2018-04-06 MED ORDER — ONDANSETRON HCL 4 MG/2ML IJ SOLN
INTRAMUSCULAR | Status: DC | PRN
  Administered 2018-04-06: 4 mg via INTRAVENOUS

## 2018-04-06 MED ORDER — CEFAZOLIN SODIUM-DEXTROSE 2-4 GM/100ML-% IV SOLN
2.0000 g | Freq: Four times a day (QID) | INTRAVENOUS | Status: AC
  Administered 2018-04-06 (×2): 2 g via INTRAVENOUS
  Filled 2018-04-06 (×2): qty 100

## 2018-04-06 MED ORDER — HYDROCODONE-ACETAMINOPHEN 7.5-325 MG PO TABS: 1.0000 | ORAL_TABLET | Freq: Once | ORAL | Status: DC | PRN

## 2018-04-06 MED ORDER — ROCURONIUM BROMIDE 50 MG/5ML IV SOSY
PREFILLED_SYRINGE | INTRAVENOUS | Status: DC | PRN
  Administered 2018-04-06: 30 mg via INTRAVENOUS

## 2018-04-06 MED ORDER — ROCURONIUM BROMIDE 10 MG/ML (PF) SYRINGE
PREFILLED_SYRINGE | INTRAVENOUS | Status: AC
  Filled 2018-04-06: qty 10

## 2018-04-06 MED ORDER — ACETAMINOPHEN 10 MG/ML IV SOLN: 1000.0000 mg | Freq: Four times a day (QID) | INTRAVENOUS | Status: DC

## 2018-04-06 MED ORDER — ASPIRIN EC 325 MG PO TBEC
325.0000 mg | DELAYED_RELEASE_TABLET | Freq: Every day | ORAL | Status: DC
  Administered 2018-04-07 – 2018-04-09 (×3): 325 mg via ORAL
  Filled 2018-04-06 (×3): qty 1

## 2018-04-06 MED ORDER — ENSURE ENLIVE PO LIQD
237.0000 mL | Freq: Two times a day (BID) | ORAL | Status: DC
  Administered 2018-04-06 – 2018-04-09 (×5): 237 mL via ORAL

## 2018-04-06 MED ORDER — TRAMADOL HCL 50 MG PO TABS
ORAL_TABLET | ORAL | Status: AC
  Filled 2018-04-06: qty 1

## 2018-04-06 MED ORDER — BUPIVACAINE-EPINEPHRINE (PF) 0.5% -1:200000 IJ SOLN
INTRAMUSCULAR | Status: DC | PRN
  Administered 2018-04-06: 30 mL

## 2018-04-06 MED ORDER — PHENYLEPHRINE HCL 10 MG/ML IJ SOLN
INTRAMUSCULAR | Status: DC | PRN
  Administered 2018-04-06 (×2): 80 ug via INTRAVENOUS

## 2018-04-06 MED ORDER — CEFAZOLIN SODIUM-DEXTROSE 2-4 GM/100ML-% IV SOLN
INTRAVENOUS | Status: AC
  Filled 2018-04-06: qty 100

## 2018-04-06 MED ORDER — PROMETHAZINE HCL 25 MG/ML IJ SOLN: 6.2500 mg | INTRAMUSCULAR | Status: DC | PRN

## 2018-04-06 MED ORDER — SUCCINYLCHOLINE CHLORIDE 20 MG/ML IJ SOLN
INTRAMUSCULAR | Status: DC | PRN
  Administered 2018-04-06: 100 mg via INTRAVENOUS

## 2018-04-06 MED ORDER — DOCUSATE SODIUM 100 MG PO CAPS
100.0000 mg | ORAL_CAPSULE | Freq: Two times a day (BID) | ORAL | Status: DC
  Administered 2018-04-06 – 2018-04-09 (×7): 100 mg via ORAL
  Filled 2018-04-06 (×6): qty 1

## 2018-04-06 MED ORDER — ALUM & MAG HYDROXIDE-SIMETH 200-200-20 MG/5ML PO SUSP: 30.0000 mL | ORAL | Status: DC | PRN

## 2018-04-06 MED ORDER — SUCCINYLCHOLINE CHLORIDE 200 MG/10ML IV SOSY
PREFILLED_SYRINGE | INTRAVENOUS | Status: AC
  Filled 2018-04-06: qty 10

## 2018-04-06 MED ORDER — METOCLOPRAMIDE HCL 5 MG/ML IJ SOLN: 5.0000 mg | Freq: Three times a day (TID) | INTRAMUSCULAR | Status: DC | PRN

## 2018-04-06 MED ORDER — PROPOFOL 10 MG/ML IV BOLUS
INTRAVENOUS | Status: DC | PRN
  Administered 2018-04-06: 60 mg via INTRAVENOUS

## 2018-04-06 MED ORDER — ONDANSETRON HCL 4 MG PO TABS
4.0000 mg | ORAL_TABLET | Freq: Four times a day (QID) | ORAL | Status: DC | PRN
  Filled 2018-04-06: qty 1

## 2018-04-06 MED ORDER — FENTANYL CITRATE (PF) 100 MCG/2ML IJ SOLN
INTRAMUSCULAR | Status: DC | PRN
  Administered 2018-04-06 (×2): 25 ug via INTRAVENOUS
  Administered 2018-04-06: 50 ug via INTRAVENOUS

## 2018-04-06 MED ORDER — TRAMADOL HCL 50 MG PO TABS
50.0000 mg | ORAL_TABLET | Freq: Four times a day (QID) | ORAL | Status: DC
  Administered 2018-04-06 – 2018-04-08 (×7): 50 mg via ORAL
  Filled 2018-04-06 (×7): qty 1

## 2018-04-06 MED ORDER — METOCLOPRAMIDE HCL 5 MG PO TABS
5.0000 mg | ORAL_TABLET | Freq: Three times a day (TID) | ORAL | Status: DC | PRN
  Filled 2018-04-06: qty 2

## 2018-04-06 MED ORDER — MENTHOL 3 MG MT LOZG: 1.0000 | LOZENGE | OROMUCOSAL | Status: DC | PRN

## 2018-04-06 MED ORDER — POVIDONE-IODINE 10 % EX SWAB: 2.0000 "application " | Freq: Once | CUTANEOUS | Status: DC

## 2018-04-06 MED ORDER — ACETAMINOPHEN 10 MG/ML IV SOLN
INTRAVENOUS | Status: AC
  Filled 2018-04-06: qty 100

## 2018-04-06 MED ORDER — PHENYLEPHRINE 40 MCG/ML (10ML) SYRINGE FOR IV PUSH (FOR BLOOD PRESSURE SUPPORT)
PREFILLED_SYRINGE | INTRAVENOUS | Status: AC
  Filled 2018-04-06: qty 10

## 2018-04-06 SURGICAL SUPPLY — 46 items
APL PRP STRL LF DISP 70% ISPRP (MISCELLANEOUS) ×1
BIT DRILL 5 ACE CANN QC (BIT) ×2 IMPLANT
BLADE HEX COATED 2.75 (ELECTRODE) ×3 IMPLANT
BNDG GAUZE ELAST 4 BULKY (GAUZE/BANDAGES/DRESSINGS) ×3 IMPLANT
CHLORAPREP W/TINT 26 (MISCELLANEOUS) ×2 IMPLANT
CLOTH BEACON ORANGE TIMEOUT ST (SAFETY) ×3 IMPLANT
COVER LIGHT HANDLE STERIS (MISCELLANEOUS) ×8 IMPLANT
COVER WAND RF STERILE (DRAPES) ×2 IMPLANT
DECANTER SPIKE VIAL GLASS SM (MISCELLANEOUS) ×4 IMPLANT
DRESSING MEPILEX BORDER 6X8 (GAUZE/BANDAGES/DRESSINGS) ×1 IMPLANT
DRSG MEPILEX BORDER 6X8 (GAUZE/BANDAGES/DRESSINGS) ×3
GLOVE BIOGEL PI IND STRL 7.0 (GLOVE) ×1 IMPLANT
GLOVE BIOGEL PI INDICATOR 7.0 (GLOVE) ×6
GLOVE SKINSENSE NS SZ8.0 LF (GLOVE) ×4
GLOVE SKINSENSE STRL SZ8.0 LF (GLOVE) ×1 IMPLANT
GLOVE SS N UNI LF 8.5 STRL (GLOVE) ×3 IMPLANT
GOWN STRL REUS W/TWL LRG LVL3 (GOWN DISPOSABLE) ×8 IMPLANT
GOWN STRL REUS W/TWL XL LVL3 (GOWN DISPOSABLE) ×3 IMPLANT
INST SET MAJOR BONE (KITS) ×3 IMPLANT
KIT BLADEGUARD II DBL (SET/KITS/TRAYS/PACK) ×3 IMPLANT
KIT TURNOVER KIT A (KITS) ×3 IMPLANT
MANIFOLD NEPTUNE II (INSTRUMENTS) ×3 IMPLANT
MARKER SKIN DUAL TIP RULER LAB (MISCELLANEOUS) ×3 IMPLANT
NDL HYPO 21X1.5 SAFETY (NEEDLE) ×1 IMPLANT
NDL SPNL 18GX3.5 QUINCKE PK (NEEDLE) ×1 IMPLANT
NEEDLE HYPO 21X1.5 SAFETY (NEEDLE) ×3 IMPLANT
NEEDLE SPNL 18GX3.5 QUINCKE PK (NEEDLE) ×3 IMPLANT
NS IRRIG 1000ML POUR BTL (IV SOLUTION) ×3 IMPLANT
PACK BASIC III (CUSTOM PROCEDURE TRAY) ×3
PACK SRG BSC III STRL LF ECLPS (CUSTOM PROCEDURE TRAY) ×1 IMPLANT
PAD ABD 5X9 TENDERSORB (GAUZE/BANDAGES/DRESSINGS) ×3 IMPLANT
PENCIL HANDSWITCHING (ELECTRODE) ×3 IMPLANT
PIN THREADED GUIDE ACE (PIN) ×9 IMPLANT
SCREW CANN 6.5 75MM (Screw) ×6 IMPLANT
SCREW CANN 6.5 85MM (Screw) ×3 IMPLANT
SCREW CANN LG 6.5 FLT 75X22 (Screw) IMPLANT
SCREW CANN LG 6.5 FLT 85X22 (Screw) IMPLANT
SET BASIN LINEN APH (SET/KITS/TRAYS/PACK) ×3 IMPLANT
SPONGE LAP 18X18 RF (DISPOSABLE) ×3 IMPLANT
STAPLER VISISTAT 35W (STAPLE) ×3 IMPLANT
SUT MON AB 0 CT1 (SUTURE) ×4 IMPLANT
SYR 30ML LL (SYRINGE) ×3 IMPLANT
SYR BULB IRRIGATION 50ML (SYRINGE) ×6 IMPLANT
TOWEL OR 17X26 4PK STRL BLUE (TOWEL DISPOSABLE) ×3 IMPLANT
WASHER ACECAN 6.5 (Washer) ×6 IMPLANT
YANKAUER SUCT BULB TIP 10FT TU (MISCELLANEOUS) ×3 IMPLANT

## 2018-04-06 NOTE — Transfer of Care (Signed)
Immediate Anesthesia Transfer of Care Note  Patient: [name]  Procedure(s) Performed: CANNULATED HIP PINNING (Right Hip)  Patient Location: PACU  Anesthesia Type:General  Level of Consciousness: awake, oriented and patient cooperative  Airway & Oxygen Therapy: Patient Spontanous Breathing and Patient connected to face mask oxygen  Post-op Assessment: Report given to RN and Post -op Vital signs reviewed and stable  Post vital signs: Reviewed and stable  Last Vitals:  Vitals Value Taken Time  BP 124/72 04/06/2018  9:45 AM  Temp 36.9 C 04/06/2018  9:41 AM  Pulse 91 04/06/2018  9:48 AM  Resp 11 04/06/2018  9:48 AM  SpO2 100 % 04/06/2018  9:48 AM  Vitals shown include unvalidated device data.  Last Pain:  Vitals:   04/06/18 0712  TempSrc: Oral  PainSc: 7       Patients Stated Pain Goal: 8 (76/72/09 4709)  Complications: No apparent anesthesia complications

## 2018-04-06 NOTE — Anesthesia Procedure Notes (Signed)
Procedure Name: Intubation Date/Time: 04/06/2018 8:13 AM Performed by: Andree Elk, Eain Mullendore A, CRNA Pre-anesthesia Checklist: Patient identified, Patient being monitored, Timeout performed, Emergency Drugs available and Suction available Patient Re-evaluated:Patient Re-evaluated prior to induction Oxygen Delivery Method: Circle system utilized Preoxygenation: Pre-oxygenation with 100% oxygen Induction Type: IV induction Ventilation: Mask ventilation without difficulty Laryngoscope Size: Glidescope and 3 Grade View: Grade I Tube type: Oral Tube size: 7.0 mm Number of attempts: 1 Airway Equipment and Method: Stylet Placement Confirmation: ETT inserted through vocal cords under direct vision,  positive ETCO2 and breath sounds checked- equal and bilateral Secured at: 21 cm Tube secured with: Tape Dental Injury: Teeth and Oropharynx as per pre-operative assessment

## 2018-04-06 NOTE — Progress Notes (Signed)
Patient being transported to OR for right hip surgery by short stay pre op nurse.

## 2018-04-06 NOTE — Anesthesia Postprocedure Evaluation (Signed)
Anesthesia Post Note  Patient: [name]  Procedure(s) Performed: CANNULATED HIP PINNING (Right Hip)  Patient location during evaluation: PACU Anesthesia Type: General Level of consciousness: awake and alert Pain management: pain level controlled Vital Signs Assessment: post-procedure vital signs reviewed and stable Respiratory status: spontaneous breathing Cardiovascular status: stable Postop Assessment: no apparent nausea or vomiting Anesthetic complications: no     Last Vitals:  Vitals:   04/06/18 0945 04/06/18 1000  BP: 124/72 123/73  Pulse: 94 90  Resp: 16 13  Temp:    SpO2: 100% 100%    Last Pain:  Vitals:   04/06/18 0941  TempSrc:   PainSc: 6                  Latish Toutant A

## 2018-04-06 NOTE — NC FL2 (Signed)
[place] LEVEL OF CARE SCREENING TOOL     IDENTIFICATION  Patient Name: [name] Birthdate: 18-Dec-1934 Sex: female Admission Date (Current Location): 04/05/2018  [facility] and [place] Number:  [employer] and Address:  [facility] [address], [place]      Provider Number: [id number]  Attending Physician Name and Address:  [name], MD  Relative Name and Phone Number:  Rebecka Apley [phone]    Current Level of Care: Hospital Recommended Level of Care: [place] Prior Approval Number:    Date Approved/Denied:   PASRR Number:    Discharge Plan: SNF    Current Diagnoses: Patient Active Problem List   Diagnosis Date Noted  . Closed right hip fracture ([place]) 04/05/2018  . GERD (gastroesophageal reflux disease) 04/05/2018  . COPD (chronic obstructive pulmonary disease) ([place]) 04/05/2018  . Hiatal hernia   . Dyspepsia 04/04/2014  . Hematochezia 04/30/2011  . RUQ pain 03/25/2011  . Adenomatous polyps 03/25/2011  . ABDOMINAL PAIN, LEFT LOWER QUADRANT 10/16/2009  . HYPERLIPIDEMIA 10/09/2009  . BRONCHITIS 10/09/2009  . Osteoporosis 10/09/2009    Orientation RESPIRATION BLADDER Height & Weight     Self, Time, Situation, Place  Normal Continent Weight: 49.7 kg Height:  5\' 3"  (160 cm)  BEHAVIORAL SYMPTOMS/MOOD NEUROLOGICAL BOWEL NUTRITION STATUS      Continent Diet(regular diet)  AMBULATORY STATUS COMMUNICATION OF NEEDS Skin   Limited Assist Verbally Surgical wounds, Bruising(surgical scar to Right hip; bruising to arms and legs bilaterally)                       Personal Care Assistance Level of Assistance  Bathing, Feeding, Dressing Bathing Assistance: Limited assistance Feeding assistance: Independent Dressing Assistance: Maximum assistance     Functional Limitations Info  Sight, Hearing, Speech Sight Info: Adequate Hearing Info: Adequate Speech Info: Adequate    SPECIAL  CARE FACTORS FREQUENCY  PT (By licensed PT)     PT Frequency: 5 days/week              Contractures Contractures Info: Not present    Additional Factors Info  Code Status, Allergies, Psychotropic Code Status Info: full Allergies Info: codeine, levofloxacin, sulfa and sulfa dirivative Psychotropic Info: xanax         Current Medications (04/06/2018):  This is the current hospital active medication list Current Facility-Administered Medications  Medication Dose Route Frequency Provider Last Rate Last Dose  . 0.9 %  sodium chloride infusion   Intravenous Continuous Carole Civil, MD 50 mL/hr at 04/06/18 1119    . acetaminophen (OFIRMEV) IV 750 mg  750 mg Intravenous Q6H [name], MD      . albuterol (PROVENTIL) (2.5 MG/3ML) 0.083% nebulizer solution 2.5 mg  2.5 mg Nebulization Q6H PRN Carole Civil, MD      . ALPRAZolam Duanne Moron) tablet 0.5 mg  0.5 mg Oral TID PRN Carole Civil, MD      . alum & mag hydroxide-simeth (MAALOX/MYLANTA) 200-200-20 MG/5ML suspension 30 mL  30 mL Oral Q4H PRN Carole Civil, MD      . Derrill Memo ON 04/07/2018] aspirin EC tablet 325 mg  325 mg Oral Q breakfast Carole Civil, MD      . calcium-vitamin D (OSCAL WITH D) 500-200 MG-UNIT per tablet 1 tablet  1 tablet Oral TID Carole Civil, MD   1 tablet at 04/05/18 2227  . ceFAZolin (ANCEF) IVPB 2g/100 mL premix  2 g Intravenous Q6H Carole Civil, MD 200 mL/hr at 04/06/18 1122 2 g at 04/06/18 1122  . docusate sodium (COLACE) capsule 100 mg  100 mg Oral BID Carole Civil, MD   100 mg at 04/06/18 1119  . feeding supplement (ENSURE ENLIVE) (ENSURE ENLIVE) liquid 237 mL  237 mL Oral BID BM [name], MD      . ipratropium-albuterol (DUONEB) 0.5-2.5 (3) MG/3ML nebulizer solution 3 mL  3 mL Nebulization QID Carole Civil, MD      . menthol-cetylpyridinium (CEPACOL) lozenge 3 mg  1 lozenge Oral PRN Carole Civil, MD       Or  . phenol (CHLORASEPTIC) mouth  spray 1 spray  1 spray Mouth/Throat PRN Carole Civil, MD      . methocarbamol (ROBAXIN) tablet 500 mg  500 mg Oral Q6H PRN Carole Civil, MD   500 mg at 04/06/18 1118   Or  . methocarbamol (ROBAXIN) 500 mg in dextrose 5 % 50 mL IVPB  500 mg Intravenous Q6H PRN Carole Civil, MD      . metoCLOPramide (REGLAN) tablet 5-10 mg  5-10 mg Oral Q8H PRN Carole Civil, MD       Or  . metoCLOPramide (REGLAN) injection 5-10 mg  5-10 mg Intravenous Q8H PRN Carole Civil, MD      . ondansetron [facility]) tablet 4 mg  4 mg Oral Q6H PRN Carole Civil, MD       Or  . ondansetron [facility]) injection 4 mg  4 mg Intravenous Q6H PRN Carole Civil, MD      . pantoprazole (PROTONIX) EC tablet 40 mg  40 mg Oral Daily Carole Civil, MD   40 mg at 04/06/18 1119  . traMADol (ULTRAM) tablet 50 mg  50 mg Oral Q6H Carole Civil, MD      . umeclidinium-vilanterol [facility] ELLIPTA) 62.5-25 MCG/INH 1 puff  1 puff Inhalation Daily Carole Civil, MD      . zolpidem [facility]) tablet 5 mg  5 mg Oral QHS PRN Carole Civil, MD         Discharge Medications: Please see discharge summary for a list of discharge medications.  Relevant Imaging Results:  Relevant Lab Results:   Additional Information SS# [id number]  Sherald Barge, RN

## 2018-04-06 NOTE — Evaluation (Signed)
Clinical/Bedside Swallow Evaluation Patient Details  Name: [name] MRN: [id number] Date of Birth: [birth year]  Today's Date: 04/06/2018 Time: SLP Start Time (ACUTE ONLY): 83 SLP Stop Time (ACUTE ONLY): 1528 SLP Time Calculation (min) (ACUTE ONLY): 26 min  Past Medical History:  Past Medical History:  Diagnosis Date  . Anxiety   . COPD (chronic obstructive pulmonary disease) ([place])   . GERD (gastroesophageal reflux disease)   . Hyperlipidemia   . IBS (irritable bowel syndrome)   . Osteoporosis    Past Surgical History:  Past Surgical History:  Procedure Laterality Date  . COLONOSCOPY  10/2009   sigmoid diverticula, multiple tubulovillous adneomas, needs surveillance Oct 2013  . ESOPHAGOGASTRODUODENOSCOPY  04/16/2011   Dr. Gala Romney: Noncritical Schatzkis ring( not manipulated because no dysphagia). Normal esophagus otherwise  large hiatal hernia. Gastric polyp-status post biopsy. Gastric erosions-staus post biopsy. Abnormal bulb-status post biopsy. benign small bowel, stomach biopsy with ulcerated gastric antral mucosa with foveolar hyperplasia and surface erosion, polyp with inflamed gastric antral mucosa   . ESOPHAGOGASTRODUODENOSCOPY N/A 04/29/2014   Dr. Gala Romney: Noncritical Schatzki's ring large hiatal hernia. Retained gastric contents.GES with slight delay   HPI:  [name]  is a 84 y.o. female, has medical history of COPD, osteoporosis, GERD, patient presents to ED status post fall with right hip pain, patient reports she was going from her bedroom to the bathroom overnight, reports she did trip, fall in the right hip, denies any head trauma, she denies any dizziness, lightheadedness, loss of consciousness preceding this fall, patient was in ED 1 day before secondary to urinary retention, where she had Foley catheter inserted, patient reports history of COPD, but she denies any dyspnea, chest pain. In ED hip x-ray significant for right hip fracture, CT head with no acute findings, she  had UA was negative yesterday during ED visit, her chest x-ray with no acute findings, ED consulted orthopedic, who requested hospitalist to admit to evaluate for surgical repair. Pt underwent repair earlier today with Dr. Aline Brochure. BSE requested.   Assessment / Plan / Recommendation Clinical Impression  Clinical swallow evaluation completed at bedside. Pt was alert, cooperative, and able to follow basic directions during visit. She c/o "scratchiness" in her throat and oral cavity appears dry with mild white patches on the tip of her tongue. Oral care completed. Pt only has a few teeth and had difficulty masticating solid meats on her lunch tray. Pt without overt signs of symptoms of aspiration over course of trials, however did demonstrate some difficulty masticating solids so will change to D3/mech soft and continue thin liquids with standard aspiration and reflux precautions. No further SLP services indicated at this time. Above to RN, reconsult if indicated.   SLP Visit Diagnosis: Dysphagia, unspecified (R13.10)    Aspiration Risk  No limitations    Diet Recommendation Dysphagia 3 (Mech soft);Thin liquid(due to sparse dentition)   Liquid Administration via: Cup;Straw Medication Administration: Whole meds with liquid Supervision: Patient able to self feed;Intermittent supervision to cue for compensatory strategies Postural Changes: Seated upright at 90 degrees;Remain upright for at least 30 minutes after po intake    Other  Recommendations Oral Care Recommendations: Oral care BID;Staff/trained caregiver to provide oral care Other Recommendations: Clarify dietary restrictions   Follow up Recommendations None      Frequency and Duration  N/A         Prognosis Prognosis for Safe Diet Advancement: Good      Swallow Study   General Date of Onset: 04/05/18 HPI:  [name]  is a 84 y.o. female, has medical history of COPD, osteoporosis, GERD, patient presents to ED status post fall  with right hip pain, patient reports she was going from her bedroom to the bathroom overnight, reports she did trip, fall in the right hip, denies any head trauma, she denies any dizziness, lightheadedness, loss of consciousness preceding this fall, patient was in ED 1 day before secondary to urinary retention, where she had Foley catheter inserted, patient reports history of COPD, but she denies any dyspnea, chest pain. In ED hip x-ray significant for right hip fracture, CT head with no acute findings, she had UA was negative yesterday during ED visit, her chest x-ray with no acute findings, ED consulted orthopedic, who requested hospitalist to admit to evaluate for surgical repair. Pt underwent repair earlier today with Dr. Aline Brochure. BSE requested. Type of Study: Bedside Swallow Evaluation Previous Swallow Assessment: None on record Diet Prior to this Study: Regular;Thin liquids Temperature Spikes Noted: No Respiratory Status: Nasal cannula History of Recent Intubation: (for hip surgery only) Behavior/Cognition: Alert;Cooperative;Pleasant mood Oral Cavity Assessment: Within Functional Limits;Dry(mild white patches on tip of tongue) Oral Care Completed by SLP: Yes Oral Cavity - Dentition: Missing dentition;Poor condition Vision: Functional for self-feeding Self-Feeding Abilities: Able to feed self;Needs set up Patient Positioning: Upright in bed Baseline Vocal Quality: Normal Volitional Cough: Strong Volitional Swallow: Able to elicit    Oral/Motor/Sensory Function Overall Oral Motor/Sensory Function: Within functional limits   Ice Chips Ice chips: Within functional limits Presentation: Spoon   Thin Liquid Thin Liquid: Within functional limits Presentation: Cup;Self Fed;Straw    Nectar Thick Nectar Thick Liquid: Not tested   Honey Thick Honey Thick Liquid: Not tested   Puree Puree: Within functional limits Presentation: Spoon;Self Fed   Solid     Solid: Impaired Presentation:  Spoon Oral Phase Impairments: Impaired mastication Oral Phase Functional Implications: Oral residue     Thank you,  Genene Churn, [place]  PORTER,DABNEY 04/06/2018,4:25 PM

## 2018-04-06 NOTE — Progress Notes (Signed)
Nutrition Brief Note  Nutrition Screen performed as part of Hip fracture order set.   Pt presented after she tripped at home and landed on hip. From chart review, there is no mention of any recent issues with appetite or weight. Her MST was 1- noting unintentional weight loss. Her current weight is a couple lbs down from her outpatient weights, however noted that her diuretic had recently been increased.   Will add oral supplement for post op healing and monitor oral intake  . Burtis Junes RD, LDN, CNSC Clinical Nutrition Available Tues-Sat via Pager: [phone] 04/06/2018 2:01 PM

## 2018-04-06 NOTE — Op Note (Signed)
OPERATIVE REPORT  Date: 04/06/2018  Time 0937  Preop dx RT hip fracture - femoral neck   Post op dx same   Procedure otif  RIGHT hip   Implants asnis titanium cannulated screws x 3   Surgeon Aline Brochure   Anesthesia spinal  Findings at surgery non displaced [facility] femoral neck fracture   Details of procedure  The patient was identified in the preoperative area, the chart was reviewed. The x-rays were reviewed. The surgical site was confirmed and marked.  The patient was then taken to the operating room where she was given a spinal anesthetic and placed on the fracture table. The injured leg was placed in a traction device and the well leg was abducted flexed and externally rotated at the hip placed in a padded leg holder.  Preliminary timeout to confirm x-ray gown and badges was performed and the C-arm was brought in and radiographs were taken in multiple planes confirming the fracture to be reduced and adequate radiographs could be obtained.  The RIGHT leg was then prepped and draped appropriately with ChloraPrep   Timeout was completed surgical site was confirmed. Implants had been checked prior to bringing the patient back to the room.  The incision was made over the RIGHT greater trochanter extended distally; subcutaneous tissue was divided in line with the skin incision. The vastus lateralis fascia was split and any bleeding vessels were coagulated.  Subperiosteal dissection continued until the proximal femur was exposed. A multi hole pin guide was placed on the bone and to threaded guidewires were placed in the superior portion of the femoral head and neck. These were adjusted until there were in the appropriate position. A third pin was placed an inverted triangle configuration at the base of the femoral neck.  Each pin was independently measured followed by drilling of the near cortex and insertion of appropriate screws with washers.  Each screw was checked with AP and  lateral x-rays using oblique x-rays were needed.  The wound was irrigated with copious amounts of saline and then closure was performed  Closure was performed with 0 Monocryl suture in 3 layers. Marcaine with epinephrine was injected in the subfascial layers time 30 mL.  The skin was reapproximated with staples  The postoperative plan is for immediate weightbearing as tolerated  Radiographs at 2, 6 and 12 weeks.  DVT prophylaxis will be provided as well for one month. ASPIRIN  CPT code [phone]

## 2018-04-06 NOTE — Interval H&P Note (Signed)
History and Physical Interval Note:  04/06/2018 7:55 AM  [name]  has presented today for surgery, with the diagnosis of Right hip subcapital fracture.  The various methods of treatment have been discussed with the patient and family. After consideration of risks, benefits and other options for treatment, the patient has consented to  Procedure(s): CANNULATED HIP PINNING (Right) as a surgical intervention.  The patient's history has been reviewed, patient examined, no change in status, stable for surgery.  I have reviewed the patient's chart and labs.  Questions were answered to the patient's satisfaction.     Arther Abbott

## 2018-04-06 NOTE — Progress Notes (Signed)
Patient given food.  Patient is alert and awake, eating meal tray provided. Patient has talked to daughter and grandson, both who have given password to speak to patient.

## 2018-04-06 NOTE — Brief Op Note (Signed)
04/06/2018  9:33 AM  PATIENT:  [name]  84 y.o. female  PRE-OPERATIVE DIAGNOSIS:  Right hip subcapital fracture  POST-OPERATIVE DIAGNOSIS:  Right hip subcapital fracture  PROCEDURE:  Procedure(s): CANNULATED HIP PINNING (Right)  Titanium screws x 3 with washers Ace system  SURGEON:  Surgeon(s) and Role:    Carole Civil, MD - Primary  PHYSICIAN ASSISTANT:   ASSISTANTS: none   ANESTHESIA:   general  EBL:  minimum   BLOOD ADMINISTERED:none  DRAINS: none   LOCAL MEDICATIONS USED:  MARCAINE and Amount: 30 ml  SPECIMEN:  No Specimen  DISPOSITION OF SPECIMEN:  N/A  COUNTS:  YES  TOURNIQUET:  * No tourniquets in log *  DICTATION: .Dragon Dictation  PLAN OF CARE: Admit to inpatient   PATIENT DISPOSITION:  PACU - hemodynamically stable.   Delay start of Pharmacological VTE agent (>24hrs) due to surgical blood loss or risk of bleeding: no

## 2018-04-06 NOTE — Anesthesia Preprocedure Evaluation (Signed)
Anesthesia Evaluation  Patient identified by MRN, date of birth, ID band Patient awake    Reviewed: Allergy & Precautions, NPO status , Patient's Chart, lab work & pertinent test results  Airway Mallampati: II       Dental  (+) Poor Dentition, Missing, Loose   Pulmonary COPD,  COPD inhaler,     + decreased breath sounds      Cardiovascular Exercise Tolerance: Good negative cardio ROS  I Rhythm:Regular Rate:Normal     Neuro/Psych Anxiety negative neurological ROS  negative psych ROS   GI/Hepatic Neg liver ROS, hiatal hernia, GERD  Medicated and Controlled,  Endo/Other  negative endocrine ROS  Renal/GU negative Renal ROS  negative genitourinary   Musculoskeletal negative musculoskeletal ROS (+)   Abdominal   Peds negative pediatric ROS (+)  Hematology negative hematology ROS (+)   Anesthesia Other Findings   Reproductive/Obstetrics negative OB ROS                             Anesthesia Physical Anesthesia Plan  ASA: III and emergent  Anesthesia Plan: General   Post-op Pain Management:    Induction: Intravenous  PONV Risk Score and Plan:   Airway Management Planned: Oral ETT  Additional Equipment:   Intra-op Plan:   Post-operative Plan: Extubation in OR  Informed Consent: I have reviewed the patients History and Physical, chart, labs and discussed the procedure including the risks, benefits and alternatives for the proposed anesthesia with the patient or authorized representative who has indicated his/her understanding and acceptance.     Dental advisory given  Plan Discussed with: CRNA  Anesthesia Plan Comments: (Deemed Emergent by Dr. Lemmie Evens. Plan full PPE use for anesth staff)        Anesthesia Quick Evaluation

## 2018-04-06 NOTE — Addendum Note (Signed)
Addendum  created 04/06/18 1041 by Mickel Baas, CRNA   Review and Sign - Signed

## 2018-04-06 NOTE — Progress Notes (Signed)
Subjective: This is an 84 year old who has multiple medical problems and who had been sick for the last several days and eventually fell at home and has a right hip subcapital fracture.  At baseline she has COPD some element of diastolic heart failure osteoporosis previous history of pulmonary fibrosis related to chronic Macrobid use anxiety, chronic pain.  Objective: Vital signs in last 24 hours: Temp:  [97.8 F (36.6 C)-99.3 F (37.4 C)] 99 F (37.2 C) (04/02 0712) Pulse Rate:  [87-105] 93 (04/02 0712) Resp:  [11-22] 22 (04/02 0712) BP: (104-142)/(67-94) 104/67 (04/02 0712) SpO2:  [86 %-100 %] 96 % (04/02 0712) Weight:  [49.7 kg-50.8 kg] 49.7 kg (04/01 2033) Weight change:  Last BM Date: 04/04/18  Intake/Output from previous day: 04/01 0701 - 04/02 0700 In: -  Out: 850 [Urine:850]  PHYSICAL EXAM General appearance: alert, cooperative and mild distress Resp: rhonchi bilaterally Cardio: regular rate and rhythm, S1, S2 normal, no murmur, click, rub or gallop GI: soft, non-tender; bowel sounds normal; no masses,  no organomegaly Extremities: Not examined except to see that she does have intact pulses  Lab Results:  Results for orders placed or performed during the hospital encounter of 04/05/18 (from the past 48 hour(s))  CBC with Differential     Status: Abnormal   Collection Time: 04/05/18  2:39 PM  Result Value Ref Range   WBC 13.2 (H) 4.0 - 10.5 K/uL   RBC 4.30 3.87 - 5.11 MIL/uL   Hemoglobin 11.5 (L) 12.0 - 15.0 g/dL   HCT 36.6 36.0 - 46.0 %   MCV 85.1 80.0 - 100.0 fL   MCH 26.7 26.0 - 34.0 pg   MCHC 31.4 30.0 - 36.0 g/dL   RDW 13.9 11.5 - 15.5 %   Platelets 427 (H) 150 - 400 K/uL   nRBC 0.0 0.0 - 0.2 %   Neutrophils Relative % 81 %   Neutro Abs 10.8 (H) 1.7 - 7.7 K/uL   Lymphocytes Relative 6 %   Lymphs Abs 0.8 0.7 - 4.0 K/uL   Monocytes Relative 10 %   Monocytes Absolute 1.3 (H) 0.1 - 1.0 K/uL   Eosinophils Relative 1 %   Eosinophils Absolute 0.1 0.0 - 0.5  K/uL   Basophils Relative 1 %   Basophils Absolute 0.1 0.0 - 0.1 K/uL   Immature Granulocytes 1 %   Abs Immature Granulocytes 0.08 (H) 0.00 - 0.07 K/uL    Comment: Performed at [facility], [address], [place], [place] [address]  Basic metabolic panel     Status: Abnormal   Collection Time: 04/05/18  2:39 PM  Result Value Ref Range   Sodium 133 (L) 135 - 145 mmol/L   Potassium 4.3 3.5 - 5.1 mmol/L   Chloride 91 (L) 98 - 111 mmol/L   CO2 30 22 - 32 mmol/L   Glucose, Bld 117 (H) 70 - 99 mg/dL   BUN 16 8 - 23 mg/dL   Creatinine, Ser 0.83 0.44 - 1.00 mg/dL   Calcium 8.8 (L) 8.9 - 10.3 mg/dL   GFR calc non Af Amer >60 >60 mL/min   GFR calc Af Amer >60 >60 mL/min   Anion gap 12 5 - 15    Comment: Performed at [facility], [address], [place], [place] [address]  Surgical pcr screen     Status: None   Collection Time: 04/06/18  1:18 AM  Result Value Ref Range   MRSA, PCR NEGATIVE NEGATIVE   Staphylococcus aureus NEGATIVE NEGATIVE  Comment: (NOTE) The Xpert SA Assay (FDA approved for NASAL specimens in patients 68 years of age and older), is one component of a comprehensive surveillance program. It is not intended to diagnose infection nor to guide or monitor treatment. Performed at [facility], [address] [place], [place] [address]   CBC     Status: Abnormal   Collection Time: 04/06/18  4:41 AM  Result Value Ref Range   WBC 13.6 (H) 4.0 - 10.5 K/uL   RBC 3.63 (L) 3.87 - 5.11 MIL/uL   Hemoglobin 9.7 (L) 12.0 - 15.0 g/dL   HCT 31.0 (L) 36.0 - 46.0 %   MCV 85.4 80.0 - 100.0 fL   MCH 26.7 26.0 - 34.0 pg   MCHC 31.3 30.0 - 36.0 g/dL   RDW 13.9 11.5 - 15.5 %   Platelets 382 150 - 400 K/uL   nRBC 0.0 0.0 - 0.2 %    Comment: Performed at [facility], [address], [place], [place] [address]  Basic metabolic panel     Status: Abnormal   Collection Time: 04/06/18  4:41 AM  Result Value Ref Range   Sodium 135 135 - 145 mmol/L   Potassium 4.1 3.5 - 5.1 mmol/L    Chloride 94 (L) 98 - 111 mmol/L   CO2 29 22 - 32 mmol/L   Glucose, Bld 109 (H) 70 - 99 mg/dL   BUN 18 8 - 23 mg/dL   Creatinine, Ser 0.85 0.44 - 1.00 mg/dL   Calcium 8.5 (L) 8.9 - 10.3 mg/dL   GFR calc non Af Amer >60 >60 mL/min   GFR calc Af Amer >60 >60 mL/min   Anion gap 12 5 - 15    Comment: Performed at [facility], [address], [place], [place] [address]    ABGS No results for input(s): PHART, PO2ART, TCO2, HCO3 in the last 72 hours.  Invalid input(s): PCO2 CULTURES Recent Results (from the past 240 hour(s))  Culture, blood (routine x 2)     Status: None (Preliminary result)   Collection Time: 04/03/18 11:36 AM  Result Value Ref Range Status   Specimen Description BLOOD RIGHT ARM  Final   Special Requests   Final    BOTTLES DRAWN AEROBIC AND ANAEROBIC Blood Culture adequate volume   Culture   Final    NO GROWTH 3 DAYS Performed at [facility], [address] [place], [place] [address]    Report Status PENDING  Incomplete  Culture, blood (routine x 2)     Status: None (Preliminary result)   Collection Time: 04/03/18 11:42 AM  Result Value Ref Range Status   Specimen Description BLOOD RIGHT HAND  Final   Special Requests   Final    BOTTLES DRAWN AEROBIC AND ANAEROBIC Blood Culture adequate volume   Culture   Final    NO GROWTH 3 DAYS Performed at [facility], [address], [place], [place] [address]    Report Status PENDING  Incomplete  Surgical pcr screen     Status: None   Collection Time: 04/06/18  1:18 AM  Result Value Ref Range Status   MRSA, PCR NEGATIVE NEGATIVE Final   Staphylococcus aureus NEGATIVE NEGATIVE Final    Comment: (NOTE) The Xpert SA Assay (FDA approved for NASAL specimens in patients 65 years of age and older), is one component of a comprehensive surveillance program. It is not intended to diagnose infection nor to guide or monitor treatment. Performed at [facility], [address], [place],  [place] [address]    Studies/Results: Dg  Lumbar Spine Complete  Result Date: 04/05/2018 CLINICAL DATA:  84 year old female with right hip and lower back pain after falling at home earlier today EXAM: LUMBAR SPINE - COMPLETE 4+ VIEW COMPARISON:  Prior CT scan of the abdomen and pelvis 04/03/2018 FINDINGS: Rotary and dextroconvex scoliosis centered at L3. The bones are diffusely osteopenic in appearance. Radiographs are extremely limited secondary to low bone density. Visualized bowel gas pattern is unremarkable. IMPRESSION: The bones are significantly demineralized which severely limits the diagnostic accuracy of this examination, particularly in light of underlying scoliosis. If there is persistent clinical concern for lumbar spinal fracture, recommend further evaluation with MRI. Rotary and dextroconvex scoliosis centered at L3. Electronically Signed   By: Jacqulynn Cadet M.D.   On: 04/05/2018 14:04   Dg Chest Port 1 View  Result Date: 04/05/2018 CLINICAL DATA:  Preop for hip surgery. EXAM: PORTABLE CHEST 1 VIEW COMPARISON:  02/17/2018 FINDINGS: Cardiac silhouette is normal in size. Moderate hiatal hernia. No mediastinal or hilar masses. Prominent bronchovascular and interstitial markings bilaterally similar to prior exams. Additional left lung base opacity is most likely atelectasis. No convincing pneumonia or pulmonary edema. No visualized pleural effusion and no pneumothorax. Skeletal structures are demineralized but grossly intact IMPRESSION: No acute cardiopulmonary disease. Electronically Signed   By: Lajean Manes M.D.   On: 04/05/2018 14:28   Dg Hip Unilat W Or Wo Pelvis 2-3 Views Right  Result Date: 04/05/2018 CLINICAL DATA:  Pt states she fell at home onset today and is c/o right hip pain and lower back pain. Pt denies any surgery to lower back or right hip. EXAM: DG HIP (WITH OR WITHOUT PELVIS) 2-3V RIGHT COMPARISON:  None. FINDINGS: There is a nondisplaced, non comminuted subcapital fracture of the right femoral neck with mild valgus  angulation. No other fractures.  No bone lesions. Hip joints, SI joints and pubic symphysis are normally spaced and aligned. Skeletal structures are demineralized. Mild subcutaneous soft tissue edema lateral to the proximal right femur. IMPRESSION: 1. Nondisplaced, non comminuted subcapital fracture of the right femoral neck with mild valgus angulation. Electronically Signed   By: Lajean Manes M.D.   On: 04/05/2018 14:02    Medications:  I have reviewed the patient's current medications. Prior to Admission:  Medications Prior to Admission  Medication Sig Dispense Refill Last Dose  . albuterol (PROVENTIL) (2.5 MG/3ML) 0.083% nebulizer solution Take 2.5 mg by nebulization every 6 (six) hours as needed for wheezing or shortness of breath.   04/05/2018 at Unknown time  . Albuterol Sulfate (PROAIR HFA IN) Inhale 2 puffs into the lungs every 4 (four) hours as needed (4-6 hours).   04/03/2018 at Unknown time  . ALPRAZolam (XANAX) 1 MG tablet Take 1 mg by mouth 3 (three) times daily as needed for anxiety.    04/04/2018 at Unknown time  . cetirizine (ZYRTEC) 10 MG tablet Take 10 mg by mouth daily.   04/04/2018 at Unknown time  . furosemide (LASIX) 40 MG tablet Take 40 mg by mouth daily.   04/05/2018 at Unknown time  . pantoprazole (PROTONIX) 40 MG tablet TAKE (1) TABLET BY MOUTH TWICE A DAY WITH MEALS (BREAKFAST AND SUPPER) (Patient taking differently: Take 40 mg by mouth daily as needed. ) 60 tablet 3 04/04/2018 at Unknown time  . potassium chloride (K-DUR) 10 MEQ tablet Take 10 mEq by mouth daily.   04/05/2018 at Unknown time  . traMADol (ULTRAM) 50 MG tablet Take 100 mg by  mouth every 4 (four) hours as needed.   04/05/2018 at Unknown time  . umeclidinium-vilanterol (ANORO ELLIPTA) 62.5-25 MCG/INH AEPB Inhale 1 puff into the lungs daily.   04/02/2018 at Unknown time  . VOLTAREN 1 % GEL Apply 1 g topically daily as needed. pain   04/02/2018 at Unknown time  . zolpidem (AMBIEN) 10 MG tablet Take 5 mg by mouth at bedtime  as needed for sleep.    04/04/2018 at Unknown time  . ipratropium (ATROVENT HFA) 17 MCG/ACT inhaler Inhale 2 puffs into the lungs every 4 (four) hours as needed for wheezing. (Patient not taking: Reported on 04/03/2018) 1 Inhaler 12 Not Taking at Unknown time  . predniSONE (DELTASONE) 10 MG tablet Take 2 tablets (20 mg total) by mouth daily. (Patient not taking: Reported on 04/03/2018) 14 tablet 0 Not Taking at Unknown time   Scheduled: . [MAR Hold] calcium-vitamin D  1 tablet Oral TID  . [MAR Hold] docusate sodium  100 mg Oral BID  . [MAR Hold] ipratropium-albuterol  3 mL Nebulization QID  . [MAR Hold] pantoprazole  40 mg Oral Daily  . povidone-iodine  2 application Topical Once  . [MAR Hold] umeclidinium-vilanterol  1 puff Inhalation Daily   Continuous: .  ceFAZolin (ANCEF) IV    . [MAR Hold] methocarbamol (ROBAXIN) IV     PRN:[MAR Hold] albuterol, [MAR Hold] ALPRAZolam, [MAR Hold]  HYDROmorphone (DILAUDID) injection, [MAR Hold] methocarbamol **OR** [MAR Hold] methocarbamol (ROBAXIN) IV, [MAR Hold] zolpidem  Assesment: She has right hip fracture.  She has had compression fractures in her spine and has osteoporosis.  She attempted to take alendronate but was unable to tolerate it because of reflux.  She has been referred to start Prolia injections but I am not sure if that is been started or not  She has COPD which is at least moderately severe.  She is recently had problems with fluid retention and she is on Lasix for that and she had more trouble recently and ended up going to the emergency room.  At that time she was found to have urinary retention and Foley catheter was placed.  She has pretty severe reflux at baseline  She has chronic pain in her back  She has significant anxiety Active Problems:   Osteoporosis   Dyspepsia   Closed right hip fracture (HCC)   GERD (gastroesophageal reflux disease)   COPD (chronic obstructive pulmonary disease) ([place])    Plan: She is having  surgery today.  She will be higher than average risk because of her COPD.    LOS: 1 day   [name] 04/06/2018, 8:28 AM

## 2018-04-07 ENCOUNTER — Encounter (HOSPITAL_COMMUNITY): Payer: Self-pay | Admitting: Orthopedic Surgery

## 2018-04-07 LAB — BASIC METABOLIC PANEL
Anion gap: 10 (ref 5–15)
BUN: 17 mg/dL (ref 8–23)
CO2: 28 mmol/L (ref 22–32)
Calcium: 8 mg/dL — ABNORMAL LOW (ref 8.9–10.3)
Chloride: 98 mmol/L (ref 98–111)
Creatinine, Ser: 0.81 mg/dL (ref 0.44–1.00)
GFR calc Af Amer: >60 mL/min (ref >60)
GFR calc non Af Amer: >60 mL/min (ref >60)
Glucose, Bld: 106 mg/dL — ABNORMAL HIGH (ref 70–99)
Potassium: 3.8 mmol/L (ref 3.5–5.1)
Sodium: 136 mmol/L (ref 135–145)

## 2018-04-07 LAB — CBC
HCT: 28.5 % — ABNORMAL LOW (ref 36.0–46.0)
Hemoglobin: 8.6 g/dL — ABNORMAL LOW (ref 12.0–15.0)
MCH: 26.4 pg (ref 26.0–34.0)
MCHC: 30.2 g/dL (ref 30.0–36.0)
MCV: 87.4 fL (ref 80.0–100.0)
Platelets: 349 10*3/uL (ref 150–400)
RBC: 3.26 MIL/uL — ABNORMAL LOW (ref 3.87–5.11)
RDW: 13.8 % (ref 11.5–15.5)
WBC: 13.4 10*3/uL — ABNORMAL HIGH (ref 4.0–10.5)
nRBC: 0 % (ref 0.0–0.2)

## 2018-04-07 MED ORDER — MAGIC MOUTHWASH
5.0000 mL | Freq: Four times a day (QID) | ORAL | Status: DC
  Administered 2018-04-07 – 2018-04-09 (×7): 5 mL via ORAL
  Filled 2018-04-07 (×7): qty 5

## 2018-04-07 NOTE — Progress Notes (Signed)
Patient ID: [name], female   DOB: 15-Oct-1934, 84 y.o.   MRN: [id number] BP 111/73 (BP Location: Right Arm)   Pulse 83   Temp 98.4 F (36.9 C) (Oral)   Resp 16   Ht 5\' 3"  (1.6 m)   Wt 49.7 kg   SpO2 98%   BMI 19.41 kg/m   Stop day #1 open treatment internal fixation right hip with cannulated screws  CBC Latest Ref Rng & Units 04/07/2018 04/06/2018 04/05/2018  WBC 4.0 - 10.5 K/uL 13.4(H) 13.6(H) 13.2(H)  Hemoglobin 12.0 - 15.0 g/dL 8.6(L) 9.7(L) 11.5(L)  Hematocrit 36.0 - 46.0 % 28.5(L) 31.0(L) 36.6  Platelets 150 - 400 K/uL 349 382 427(H)   BMP Latest Ref Rng & Units 04/07/2018 04/06/2018 04/05/2018  Glucose 70 - 99 mg/dL 106(H) 109(H) 117(H)  BUN 8 - 23 mg/dL 17 18 16   Creatinine 0.44 - 1.00 mg/dL 0.81 0.85 0.83  Sodium 135 - 145 mmol/L 136 135 133(L)  Potassium 3.5 - 5.1 mmol/L 3.8 4.1 4.3  Chloride 98 - 111 mmol/L 98 94(L) 91(L)  CO2 22 - 32 mmol/L 28 29 30   Calcium 8.9 - 10.3 mg/dL 8.0(L) 8.5(L) 8.8(L)    Hemoglobin 8.6 monitor replace fluids first  I reviewed her course today with her nurse who says she walked with physical therapy well she is doing well in terms of pain control with tramadol every 6 hours.  The patient should be able to be discharged home tomorrow with home health and physical therapy

## 2018-04-07 NOTE — Plan of Care (Signed)

## 2018-04-07 NOTE — Evaluation (Signed)
Occupational Therapy Evaluation Patient Details Name: [name] MRN: [id number] DOB: 1934/12/03 Today's Date: 04/07/2018    History of Present Illness s/p right cannulated hip pinning   Clinical Impression   Pt received supine in bed, awake and alert and agreeable to OT evaluation. PTA pt requiring occasional assistance with ADLs-LB tasks and bathing, daughter assisting. Pt unable to complete tasks in standing today due to RLE pain and weakness, able to complete seated UB tasks with set-up. Assistance provided for LB tasks. Pt reporting occasional pain during session, mostly during mobility, once seated or static standing reports no pain. Recommend SNF on discharge to improve pt's safety and independence during ADLs and functional mobility tasks.     Follow Up Recommendations    SNF   Equipment Recommendations  Other (comment)(RW)       Precautions / Restrictions Precautions Precautions: Fall Restrictions Weight Bearing Restrictions: Yes RLE Weight Bearing: Weight bearing as tolerated      Mobility Bed Mobility Overal bed mobility: Needs Assistance Bed Mobility: Supine to Sit     Supine to sit: Min assist     General bed mobility comments: cuing for hand placement, slow labored movement  Transfers Overall transfer level: Needs assistance Equipment used: Rolling walker (2 wheeled) Transfers: Sit to/from [facility] Sit to Stand: Min assist Stand pivot transfers: Min guard       General transfer comment: cuing for hand placement        ADL either performed or assessed with clinical judgement   ADL Overall ADL's : Needs assistance/impaired     Grooming: Set up;Sitting               Lower Body Dressing: Total assistance;Bed level Lower Body Dressing Details (indicate cue type and reason): assist for donning socks Toilet Transfer: Min Geophysical data processor Details (indicate cue type and reason): simulated with bed to  chair transfer           General ADL Comments: cuing for sequencing and hand placement     Vision Baseline Vision/History: No visual deficits Patient Visual Report: No change from baseline Vision Assessment?: No apparent visual deficits            Pertinent Vitals/Pain Pain Assessment: Faces Faces Pain Scale: Hurts little more(with movement) Pain Location: right hip Pain Descriptors / Indicators: Grimacing;Guarding Pain Intervention(s): Limited activity within patient's tolerance;Monitored during session;Repositioned     Hand Dominance Right   Extremity/Trunk Assessment Upper Extremity Assessment Upper Extremity Assessment: Generalized weakness(grossly 4-/5 in BUE)   Lower Extremity Assessment Lower Extremity Assessment: Defer to PT evaluation   Cervical / Trunk Assessment Cervical / Trunk Assessment: Kyphotic   Communication Communication Communication: HOH   Cognition Arousal/Alertness: Awake/alert Behavior During Therapy: WFL for tasks assessed/performed Overall Cognitive Status: Within Functional Limits for tasks assessed                                                Home Living Family/patient expects to be discharged to:: Skilled nursing facility Living Arrangements: Children(daughter and grandson; additional family close by) Available Help at Discharge: Family;Available 24 hours/day Type of Home: Mobile home Home Access: Stairs to enter Entrance Stairs-Number of Steps: 5 Entrance Stairs-Rails: Left;Right Home Layout: One level     Bathroom Shower/Tub: Teacher, early years/pre: Standard     Home Equipment: Environmental consultant - 4  wheels;Bedside commode;Wheelchair - manual;Hospital bed;Cane - single point          Prior Functioning/Environment Level of Independence: Needs assistance  Gait / Transfers Assistance Needed: Pt using rollator for functional mobility ADL's / Homemaking Assistance Needed: daughter assisting with LB  dressing, supervising bathing, and assisting with housework and meal preparation            OT Problem List: Decreased strength;Decreased activity tolerance;Impaired balance (sitting and/or standing);Decreased safety awareness;Decreased knowledge of use of DME or AE;Pain       End of Session Equipment Utilized During Treatment: Gait belt;Rolling walker Nurse Communication: Mobility status  Activity Tolerance: Patient tolerated treatment well Patient left: in chair;with call bell/phone within reach;with chair alarm set;Other (comment)(PT in room)  OT Visit Diagnosis: Repeated falls (R29.6);Pain Pain - Right/Left: Right Pain - part of body: Hip                Time: 8185-9093 OT Time Calculation (min): 26 min Charges:  OT General Charges $OT Visit: 1 Visit OT Evaluation $OT Eval Low Complexity: [place], OTR/L  [phone] 04/07/2018, 8:16 AM

## 2018-04-07 NOTE — Progress Notes (Signed)
Patient has been unable to void since foley was removed prior to shift change this morning.  Dr Luan Pulling contacted, verbal order obtained to reinsert foley and patient is to be discharged home with foley.

## 2018-04-07 NOTE — TOC Initial Note (Signed)
Transition of Care [facility]) - Initial/Assessment Note    Patient Details  Name: [name] MRN: [id number] Date of Birth: 25-Jan-1934  [facility]) CM/SW Contact:    Sherald Barge, RN Phone Number: 04/07/2018, 9:33 AM  Clinical Narrative:   CM discussed DC plan with pt and daughter. Daughter adement she wants mom at home, she is a Quarry manager and will provide 24/7 care. Does not feel she needs any DME at this time. No preference of [place] Provider, CMS list reviewed via phone. Referral given to [facility] as they serve the [employer]. Pt will be going to [place], [place].                  Expected Discharge Plan: [place]     Patient Goals and CMS Choice Patient states their goals for this hospitalization and ongoing recovery are:: Keep mom at home CMS Medicare.gov Compare Post Acute Care list provided to:: Patient Represenative (must comment) Choice offered to / list presented to : Adult Children  Expected Discharge Plan and Services Expected Discharge Plan: [place] Choice: [place] arrangements for the past 2 months: Single Family Home                     HH Arranged: RN, PT [place] Agency: [place]  Prior Living Arrangements/Services Living arrangements for the past 2 months: Single Family Home Lives with:: Adult Children Patient language and need for interpreter reviewed:: Yes        Need for Family Participation in Patient Care: Yes (Comment) Care giver support system in place?: Yes (comment) Current home services: DME(walker, Wheelchair, bcs, shower stool) Criminal Activity/Legal Involvement Pertinent to Current Situation/Hospitalization: No - Comment as needed  Activities of Daily Living Home Assistive Devices/Equipment: Environmental consultant (specify type), Cane (specify quad or straight) ADL Screening (condition at time of admission) Patient's cognitive ability  adequate to safely complete daily activities?: Yes Is the patient deaf or have difficulty hearing?: No Does the patient have difficulty seeing, even when wearing glasses/contacts?: No Does the patient have difficulty concentrating, remembering, or making decisions?: No Patient able to express need for assistance with ADLs?: Yes Does the patient have difficulty dressing or bathing?: No Independently performs ADLs?: No Communication: Independent Dressing (OT): Independent Grooming: Independent Feeding: Independent Bathing: Independent Toileting: Independent with device (comment), Independent Is this a change from baseline?: Pre-admission baseline In/Out Bed: Independent Walks in Home: Independent with device (comment)(Walker or cane) Does the patient have difficulty walking or climbing stairs?: Yes Weakness of Legs: Both Weakness of Arms/Hands: None  Permission Sought/Granted Permission sought to share information with : Facility Sport and exercise psychologist, Family Supports Permission granted to share information with : Yes, Verbal Permission Granted  Share Information with NAME: Kalman Shan  Permission granted to share info w AGENCY: [facility]        Emotional Assessment Appearance:: Appears older than stated age   Affect (typically observed): Appropriate, Pleasant Orientation: : Oriented to Self, Oriented to Place, Oriented to  Time, Oriented to Situation      Admission diagnosis:  Pre-op evaluation [Z01.818] Patient Active Problem List   Diagnosis Date Noted  . Closed right hip fracture ([place]) 04/05/2018  . GERD (gastroesophageal reflux disease) 04/05/2018  . COPD (chronic obstructive pulmonary disease) ([place]) 04/05/2018  . Hiatal hernia   . Dyspepsia 04/04/2014  . Hematochezia 04/30/2011  .  RUQ pain 03/25/2011  . Adenomatous polyps 03/25/2011  . ABDOMINAL PAIN, LEFT LOWER QUADRANT 10/16/2009  . HYPERLIPIDEMIA 10/09/2009  . BRONCHITIS 10/09/2009  . Osteoporosis 10/09/2009    PCP:  [name], MD Pharmacy:   [facility] [place] - [address] [place] [address] Phone: [phone] Fax: [phone]

## 2018-04-07 NOTE — Evaluation (Signed)
Physical Therapy Evaluation Patient Details Name: [name] MRN: [id number] DOB: 1935-01-03 Today's Date: 04/07/2018   History of Present Illness  [name]  is a 84 y.o. female, s/p ORIF Right hip fracture 04/06/18, has medical history of COPD, osteoporosis, GERD, patient presents to ED status post fall with right hip pain, patient reports she was going from her bedroom to the bathroom overnight, reports she did trip, fall in the right hip, denies any head trauma, she denies any dizziness, lightheadedness, loss of consciousness preceding this fall, patient was in ED 1 day before secondary to urinary retention, where she had Foley catheter inserted, patient reports history of COPD, but she denies any dyspnea, chest pain.    Clinical Impression  Patient has difficulty moving RLE during bed mobility requiring assistance to lift onto bed, labored movement during sit to stands and transfers, fair/good return for right heel to toe stepping during ambulation without loss of balance, limited mostly due to c/o fatigue and increasing right hip pain with activity.  Patient tolerated sitting up in chair after therapy.  Patient will benefit from continued physical therapy in hospital and recommended venue below to increase strength, balance, endurance for safe ADLs and gait.    Follow Up Recommendations SNF;Supervision for mobility/OOB;Supervision/Assistance - 24 hour    Equipment Recommendations  Rolling walker with 5" wheels    Recommendations for Other Services       Precautions / Restrictions Precautions Precautions: Fall Restrictions Weight Bearing Restrictions: Yes RLE Weight Bearing: Weight bearing as tolerated      Mobility  Bed Mobility Overal bed mobility: Needs Assistance Bed Mobility: Supine to Sit;Sit to Supine     Supine to sit: Min guard;Min assist Sit to supine: Min assist   General bed mobility comments: requires assistance to move RLE due to  pain/weakness  Transfers Overall transfer level: Needs assistance Equipment used: Rolling walker (2 wheeled) Transfers: Sit to/from [facility] Sit to Stand: Min assist Stand pivot transfers: Min guard       General transfer comment: slow labored movement  Ambulation/Gait Ambulation/Gait assistance: Herbalist (Feet): 55 Feet Assistive device: Rolling walker (2 wheeled) Gait Pattern/deviations: Decreased step length - right;Decreased step length - left;Decreased stance time - right;Decreased stride length Gait velocity: decreased   General Gait Details: slightly labored slow cadence with fair/good return for right heel to toe stepping without loss of balance, limited mostly due to fatigue and right hip pain  Stairs            Wheelchair Mobility    Modified Rankin (Stroke Patients Only)       Balance Overall balance assessment: Needs assistance Sitting-balance support: Feet supported Sitting balance-Leahy Scale: Good     Standing balance support: Bilateral upper extremity supported;During functional activity Standing balance-Leahy Scale: Fair Standing balance comment: using RW                             Pertinent Vitals/Pain Pain Assessment: Faces Faces Pain Scale: Hurts little more Pain Location: right hip Pain Descriptors / Indicators: Grimacing;Guarding;Sore Pain Intervention(s): Limited activity within patient's tolerance;Monitored during session;Premedicated before session    Home Living Family/patient expects to be discharged to:: Private residence Living Arrangements: Children Available Help at Discharge: Family;Available 24 hours/day Type of Home: Mobile home Home Access: Stairs to enter Entrance Stairs-Rails: Left;Right;Can reach both Entrance Stairs-Number of Steps: 5 Home Layout: One level Home Equipment: Walker - 4 wheels;Bedside commode;Wheelchair -  manual;Hospital bed;Cane - single point       Prior Function Level of Independence: Needs assistance   Gait / Transfers Assistance Needed: household ambulator using Rollator  ADL's / Homemaking Assistance Needed: daughter assisting with LB dressing, supervising bathing, and assisting with housework and meal preparation        Hand Dominance   Dominant Hand: Right    Extremity/Trunk Assessment   Upper Extremity Assessment Upper Extremity Assessment: Defer to OT evaluation    Lower Extremity Assessment Lower Extremity Assessment: Generalized weakness;RLE deficits/detail;LLE deficits/detail RLE Deficits / Details: grossly 3+/5 LLE Deficits / Details: grossly 5/5    Cervical / Trunk Assessment Cervical / Trunk Assessment: Kyphotic  Communication   Communication: HOH  Cognition Arousal/Alertness: Awake/alert Behavior During Therapy: WFL for tasks assessed/performed Overall Cognitive Status: Within Functional Limits for tasks assessed                                        General Comments      Exercises     Assessment/Plan    PT Assessment Patient needs continued PT services  PT Problem List Decreased strength;Decreased activity tolerance;Decreased balance;Decreased mobility;Pain       PT Treatment Interventions Gait training;Stair training;Functional mobility training;Therapeutic activities;Patient/family education;Therapeutic exercise    PT Goals (Current goals can be found in the Care Plan section)  Acute Rehab PT Goals Patient Stated Goal: return home with family to assist PT Goal Formulation: With patient Time For Goal Achievement: 04/21/18 Potential to Achieve Goals: Good    Frequency 7X/week   Barriers to discharge        Co-evaluation               AM-PAC PT "6 Clicks" Mobility  Outcome Measure Help needed turning from your back to your side while in a flat bed without using bedrails?: A Little Help needed moving from lying on your back to sitting on the side of a  flat bed without using bedrails?: A Little Help needed moving to and from a bed to a chair (including a wheelchair)?: A Little Help needed standing up from a chair using your arms (e.g., wheelchair or bedside chair)?: A Little Help needed to walk in hospital room?: A Little Help needed climbing 3-5 steps with a railing? : A Lot 6 Click Score: 17    End of Session Equipment Utilized During Treatment: Gait belt Activity Tolerance: Patient tolerated treatment well;Patient limited by fatigue Patient left: in chair;with call bell/phone within reach;with chair alarm set Nurse Communication: Mobility status PT Visit Diagnosis: Unsteadiness on feet (R26.81);Other abnormalities of gait and mobility (R26.89);Muscle weakness (generalized) (M62.81)    Time: 8657-8469 PT Time Calculation (min) (ACUTE ONLY): 33 min   Charges:   PT Evaluation $PT Eval Moderate Complexity: 1 Mod PT Treatments $Therapeutic Activity: 23-37 mins        9:07 AM, 04/07/18 Lonell Grandchild, MPT Physical Therapist with [facility] [phone] office [phone] mobile phone

## 2018-04-07 NOTE — Progress Notes (Signed)
Subjective: She feels well and really has no complaints.  She is already done therapy and said she felt like she did well.  Her breathing is good.  No chest pain.  No other complaints.  Objective: Vital signs in last 24 hours: Temp:  [98 F (36.7 C)-99 F (37.2 C)] 98.4 F (36.9 C) (04/03 0459) Pulse Rate:  [81-94] 83 (04/03 0459) Resp:  [11-16] 16 (04/03 0459) BP: (93-124)/(58-76) 111/73 (04/03 0459) SpO2:  [95 %-100 %] 98 % (04/03 0730) Weight change:  Last BM Date: 04/04/18  Intake/Output from previous day: 04/02 0701 - 04/03 0700 In: 2410.7 [P.O.:300; I.V.:1684.2; IV Piggyback:426.5] Out: 9892 [Urine:1340; Blood:75]  PHYSICAL EXAM General appearance: alert, cooperative and no distress Resp: clear to auscultation bilaterally Cardio: regular rate and rhythm, S1, S2 normal, no murmur, click, rub or gallop GI: soft, non-tender; bowel sounds normal; no masses,  no organomegaly Extremities: I did not examine her operative site  Lab Results:  Results for orders placed or performed during the hospital encounter of 04/05/18 (from the past 48 hour(s))  CBC with Differential     Status: Abnormal   Collection Time: 04/05/18  2:39 PM  Result Value Ref Range   WBC 13.2 (H) 4.0 - 10.5 K/uL   RBC 4.30 3.87 - 5.11 MIL/uL   Hemoglobin 11.5 (L) 12.0 - 15.0 g/dL   HCT 36.6 36.0 - 46.0 %   MCV 85.1 80.0 - 100.0 fL   MCH 26.7 26.0 - 34.0 pg   MCHC 31.4 30.0 - 36.0 g/dL   RDW 13.9 11.5 - 15.5 %   Platelets 427 (H) 150 - 400 K/uL   nRBC 0.0 0.0 - 0.2 %   Neutrophils Relative % 81 %   Neutro Abs 10.8 (H) 1.7 - 7.7 K/uL   Lymphocytes Relative 6 %   Lymphs Abs 0.8 0.7 - 4.0 K/uL   Monocytes Relative 10 %   Monocytes Absolute 1.3 (H) 0.1 - 1.0 K/uL   Eosinophils Relative 1 %   Eosinophils Absolute 0.1 0.0 - 0.5 K/uL   Basophils Relative 1 %   Basophils Absolute 0.1 0.0 - 0.1 K/uL   Immature Granulocytes 1 %   Abs Immature Granulocytes 0.08 (H) 0.00 - 0.07 K/uL    Comment: Performed at  [facility], [address] [place], [place] [address]  Basic metabolic panel     Status: Abnormal   Collection Time: 04/05/18  2:39 PM  Result Value Ref Range   Sodium 133 (L) 135 - 145 mmol/L   Potassium 4.3 3.5 - 5.1 mmol/L   Chloride 91 (L) 98 - 111 mmol/L   CO2 30 22 - 32 mmol/L   Glucose, Bld 117 (H) 70 - 99 mg/dL   BUN 16 8 - 23 mg/dL   Creatinine, Ser 0.83 0.44 - 1.00 mg/dL   Calcium 8.8 (L) 8.9 - 10.3 mg/dL   GFR calc non Af Amer >60 >60 mL/min   GFR calc Af Amer >60 >60 mL/min   Anion gap 12 5 - 15    Comment: Performed at [facility], [address] [place], [place] [address]  Surgical pcr screen     Status: None   Collection Time: 04/06/18  1:18 AM  Result Value Ref Range   MRSA, PCR NEGATIVE NEGATIVE   Staphylococcus aureus NEGATIVE NEGATIVE    Comment: (NOTE) The Xpert SA Assay (FDA approved for NASAL specimens in patients 17 years of age and older), is one component of a comprehensive surveillance program. It  is not intended to diagnose infection nor to guide or monitor treatment. Performed at [facility], [address] [place], [place] [address]   CBC     Status: Abnormal   Collection Time: 04/06/18  4:41 AM  Result Value Ref Range   WBC 13.6 (H) 4.0 - 10.5 K/uL   RBC 3.63 (L) 3.87 - 5.11 MIL/uL   Hemoglobin 9.7 (L) 12.0 - 15.0 g/dL   HCT 31.0 (L) 36.0 - 46.0 %   MCV 85.4 80.0 - 100.0 fL   MCH 26.7 26.0 - 34.0 pg   MCHC 31.3 30.0 - 36.0 g/dL   RDW 13.9 11.5 - 15.5 %   Platelets 382 150 - 400 K/uL   nRBC 0.0 0.0 - 0.2 %    Comment: Performed at [facility], [address], [place], [place] [address]  Basic metabolic panel     Status: Abnormal   Collection Time: 04/06/18  4:41 AM  Result Value Ref Range   Sodium 135 135 - 145 mmol/L   Potassium 4.1 3.5 - 5.1 mmol/L   Chloride 94 (L) 98 - 111 mmol/L   CO2 29 22 - 32 mmol/L   Glucose, Bld 109 (H) 70 - 99 mg/dL   BUN 18 8 - 23 mg/dL   Creatinine, Ser 0.85 0.44 - 1.00 mg/dL   Calcium 8.5 (L) 8.9 -  10.3 mg/dL   GFR calc non Af Amer >60 >60 mL/min   GFR calc Af Amer >60 >60 mL/min   Anion gap 12 5 - 15    Comment: Performed at [facility], [address] [place], [place] [address]  CBC     Status: Abnormal   Collection Time: 04/07/18  4:19 AM  Result Value Ref Range   WBC 13.4 (H) 4.0 - 10.5 K/uL   RBC 3.26 (L) 3.87 - 5.11 MIL/uL   Hemoglobin 8.6 (L) 12.0 - 15.0 g/dL   HCT 28.5 (L) 36.0 - 46.0 %   MCV 87.4 80.0 - 100.0 fL   MCH 26.4 26.0 - 34.0 pg   MCHC 30.2 30.0 - 36.0 g/dL   RDW 13.8 11.5 - 15.5 %   Platelets 349 150 - 400 K/uL   nRBC 0.0 0.0 - 0.2 %    Comment: Performed at [facility], [address] [place], [place] [address]  Basic metabolic panel     Status: Abnormal   Collection Time: 04/07/18  4:19 AM  Result Value Ref Range   Sodium 136 135 - 145 mmol/L   Potassium 3.8 3.5 - 5.1 mmol/L   Chloride 98 98 - 111 mmol/L   CO2 28 22 - 32 mmol/L   Glucose, Bld 106 (H) 70 - 99 mg/dL   BUN 17 8 - 23 mg/dL   Creatinine, Ser 0.81 0.44 - 1.00 mg/dL   Calcium 8.0 (L) 8.9 - 10.3 mg/dL   GFR calc non Af Amer >60 >60 mL/min   GFR calc Af Amer >60 >60 mL/min   Anion gap 10 5 - 15    Comment: Performed at [facility], [address] [place], [place] [address]    ABGS No results for input(s): PHART, PO2ART, TCO2, HCO3 in the last 72 hours.  Invalid input(s): PCO2 CULTURES Recent Results (from the past 240 hour(s))  Culture, blood (routine x 2)     Status: None (Preliminary result)   Collection Time: 04/03/18 11:36 AM  Result Value Ref Range Status   Specimen Description BLOOD RIGHT ARM  Final   Special  Requests   Final    BOTTLES DRAWN AEROBIC AND ANAEROBIC Blood Culture adequate volume   Culture   Final    NO GROWTH 4 DAYS Performed at [facility], [address] [place], [place] [address]    Report Status PENDING  Incomplete  Culture, blood (routine x 2)     Status: None (Preliminary result)   Collection Time: 04/03/18 11:42 AM  Result Value Ref Range Status    Specimen Description BLOOD RIGHT HAND  Final   Special Requests   Final    BOTTLES DRAWN AEROBIC AND ANAEROBIC Blood Culture adequate volume   Culture   Final    NO GROWTH 4 DAYS Performed at [facility], [address], [place], [place] [address]    Report Status PENDING  Incomplete  Surgical pcr screen     Status: None   Collection Time: 04/06/18  1:18 AM  Result Value Ref Range Status   MRSA, PCR NEGATIVE NEGATIVE Final   Staphylococcus aureus NEGATIVE NEGATIVE Final    Comment: (NOTE) The Xpert SA Assay (FDA approved for NASAL specimens in patients 23 years of age and older), is one component of a comprehensive surveillance program. It is not intended to diagnose infection nor to guide or monitor treatment. Performed at [facility], [address] [place], [place] [address]    Studies/Results: Dg Lumbar Spine Complete  Result Date: 04/05/2018 CLINICAL DATA:  84 year old female with right hip and lower back pain after falling at home earlier today EXAM: LUMBAR SPINE - COMPLETE 4+ VIEW COMPARISON:  Prior CT scan of the abdomen and pelvis 04/03/2018 FINDINGS: Rotary and dextroconvex scoliosis centered at L3. The bones are diffusely osteopenic in appearance. Radiographs are extremely limited secondary to low bone density. Visualized bowel gas pattern is unremarkable. IMPRESSION: The bones are significantly demineralized which severely limits the diagnostic accuracy of this examination, particularly in light of underlying scoliosis. If there is persistent clinical concern for lumbar spinal fracture, recommend further evaluation with MRI. Rotary and dextroconvex scoliosis centered at L3. Electronically Signed   By: Jacqulynn Cadet M.D.   On: 04/05/2018 14:04   Dg Chest Port 1 View  Result Date: 04/05/2018 CLINICAL DATA:  Preop for hip surgery. EXAM: PORTABLE CHEST 1 VIEW COMPARISON:  02/17/2018 FINDINGS: Cardiac silhouette is normal in size. Moderate hiatal hernia. No mediastinal or  hilar masses. Prominent bronchovascular and interstitial markings bilaterally similar to prior exams. Additional left lung base opacity is most likely atelectasis. No convincing pneumonia or pulmonary edema. No visualized pleural effusion and no pneumothorax. Skeletal structures are demineralized but grossly intact IMPRESSION: No acute cardiopulmonary disease. Electronically Signed   By: Lajean Manes M.D.   On: 04/05/2018 14:28   Dg C-arm 1-60 Min  Result Date: 04/06/2018 CLINICAL DATA:  Fracture EXAM: OPERATIVE RIGHT HIP (WITH PELVIS IF PERFORMED) 7 VIEWS TECHNIQUE: Fluoroscopic spot image(s) were submitted for interpretation post-operatively. COMPARISON:  04/05/2018 FINDINGS: Multiple intraoperative spot images demonstrate placement of 3 screws across the right femoral neck fracture. No hardware complicating feature. Anatomic alignment. IMPRESSION: Internal fixation across the right femoral neck fracture. No complicating feature. Electronically Signed   By: Rolm Baptise M.D.   On: 04/06/2018 11:05   Dg Hip Operative Unilat With Pelvis Right  Result Date: 04/06/2018 CLINICAL DATA:  Fracture EXAM: OPERATIVE RIGHT HIP (WITH PELVIS IF PERFORMED) 7 VIEWS TECHNIQUE: Fluoroscopic spot image(s) were submitted for interpretation post-operatively. COMPARISON:  04/05/2018 FINDINGS: Multiple intraoperative spot images demonstrate placement of 3 screws across the right femoral  neck fracture. No hardware complicating feature. Anatomic alignment. IMPRESSION: Internal fixation across the right femoral neck fracture. No complicating feature. Electronically Signed   By: Rolm Baptise M.D.   On: 04/06/2018 11:05   Dg Hip Unilat W Or Wo Pelvis 2-3 Views Right  Result Date: 04/05/2018 CLINICAL DATA:  Pt states she fell at home onset today and is c/o right hip pain and lower back pain. Pt denies any surgery to lower back or right hip. EXAM: DG HIP (WITH OR WITHOUT PELVIS) 2-3V RIGHT COMPARISON:  None. FINDINGS: There is a  nondisplaced, non comminuted subcapital fracture of the right femoral neck with mild valgus angulation. No other fractures.  No bone lesions. Hip joints, SI joints and pubic symphysis are normally spaced and aligned. Skeletal structures are demineralized. Mild subcutaneous soft tissue edema lateral to the proximal right femur. IMPRESSION: 1. Nondisplaced, non comminuted subcapital fracture of the right femoral neck with mild valgus angulation. Electronically Signed   By: Lajean Manes M.D.   On: 04/05/2018 14:02    Medications:  Prior to Admission:  Medications Prior to Admission  Medication Sig Dispense Refill Last Dose  . albuterol (PROVENTIL) (2.5 MG/3ML) 0.083% nebulizer solution Take 2.5 mg by nebulization every 6 (six) hours as needed for wheezing or shortness of breath.   04/05/2018 at Unknown time  . Albuterol Sulfate (PROAIR HFA IN) Inhale 2 puffs into the lungs every 4 (four) hours as needed (4-6 hours).   04/03/2018 at Unknown time  . ALPRAZolam (XANAX) 1 MG tablet Take 1 mg by mouth 3 (three) times daily as needed for anxiety.    04/04/2018 at Unknown time  . cetirizine (ZYRTEC) 10 MG tablet Take 10 mg by mouth daily.   04/04/2018 at Unknown time  . furosemide (LASIX) 40 MG tablet Take 40 mg by mouth daily.   04/05/2018 at Unknown time  . pantoprazole (PROTONIX) 40 MG tablet TAKE (1) TABLET BY MOUTH TWICE A DAY WITH MEALS (BREAKFAST AND SUPPER) (Patient taking differently: Take 40 mg by mouth daily as needed. ) 60 tablet 3 04/04/2018 at Unknown time  . potassium chloride (K-DUR) 10 MEQ tablet Take 10 mEq by mouth daily.   04/05/2018 at Unknown time  . traMADol (ULTRAM) 50 MG tablet Take 100 mg by mouth every 4 (four) hours as needed.   04/05/2018 at Unknown time  . umeclidinium-vilanterol (ANORO ELLIPTA) 62.5-25 MCG/INH AEPB Inhale 1 puff into the lungs daily.   04/02/2018 at Unknown time  . VOLTAREN 1 % GEL Apply 1 g topically daily as needed. pain   04/02/2018 at Unknown time  . zolpidem (AMBIEN) 10  MG tablet Take 5 mg by mouth at bedtime as needed for sleep.    04/04/2018 at Unknown time  . ipratropium (ATROVENT HFA) 17 MCG/ACT inhaler Inhale 2 puffs into the lungs every 4 (four) hours as needed for wheezing. (Patient not taking: Reported on 04/03/2018) 1 Inhaler 12 Not Taking at Unknown time  . predniSONE (DELTASONE) 10 MG tablet Take 2 tablets (20 mg total) by mouth daily. (Patient not taking: Reported on 04/03/2018) 14 tablet 0 Not Taking at Unknown time   Scheduled: . aspirin EC  325 mg Oral Q breakfast  . calcium-vitamin D  1 tablet Oral TID  . docusate sodium  100 mg Oral BID  . feeding supplement (ENSURE ENLIVE)  237 mL Oral BID BM  . ipratropium-albuterol  3 mL Nebulization QID  . pantoprazole  40 mg Oral Daily  . traMADol  50 mg  Oral Q6H  . umeclidinium-vilanterol  1 puff Inhalation Daily   Continuous: . sodium chloride 50 mL/hr at 04/06/18 1119  . methocarbamol (ROBAXIN) IV     GGE:ZMOQHUTML, ALPRAZolam, alum & mag hydroxide-simeth, menthol-cetylpyridinium **OR** phenol, methocarbamol **OR** methocarbamol (ROBAXIN) IV, metoCLOPramide **OR** metoCLOPramide (REGLAN) injection, ondansetron **OR** ondansetron (ZOFRAN) IV, zolpidem  Assesment: She has closed right hip fracture and she had surgery yesterday and is already doing therapy and doing well apparently.  Initially it was felt that she was going to need skilled care facility but she tells me today that she is planning to go home probably tomorrow.  She will need equipment and will obviously need home health.  She has stable COPD  She has osteoporosis and she did not tolerate Fosamax.  She will need alternative treatment Active Problems:   Osteoporosis   Dyspepsia   Closed right hip fracture (HCC)   GERD (gastroesophageal reflux disease)   COPD (chronic obstructive pulmonary disease) ([place])    Plan: Continue current treatments continue PT etc.    LOS: 2 days   Alonza Bogus 04/07/2018, 9:05 AM

## 2018-04-07 NOTE — Care Management Important Message (Signed)
Important Message  Patient Details  Name: GLANDA SPANBAUER MRN: [id number] Date of Birth: 1934-10-17   Medicare Important Message Given:  Yes    Sherald Barge, RN 04/07/2018, 2:45 PM

## 2018-04-07 NOTE — TOC Progression Note (Signed)
Transition of Care [facility]) - Progression Note    Patient Details  Name: [name] MRN: [id number] Date of Birth: 25-May-1934  Transition of Care [facility] CM/SW Contact  Roda Shutters Margretta Sidle, RN Phone Number: 04/07/2018, 1:55 PM  Clinical Narrative:   Pt needs RW as she only has rollator at home. Referral given to [employer] who will deliver walker to pt room today in prep for DC tomorrow.     Expected Discharge Plan: [place]    Expected Discharge Plan and Services Expected Discharge Plan: [place] Choice: [place] arrangements for the past 2 months: Single Family Home                 DME Arranged: Walker rolling DME Agency: [employer] HH Arranged: RN, PT [place] Agency: [place]   Social Determinants of Health (SDOH) Interventions    Readmission Risk Interventions Readmission Risk Prevention Plan 04/07/2018  Transportation Screening Complete  PCP or Specialist Appt within 5-7 Days Complete  Home Care Screening Complete  Medication Review (RN CM) Complete  Some recent data might be hidden

## 2018-04-07 NOTE — Plan of Care (Signed)
  Problem: Acute Rehab PT Goals(only PT should resolve) Goal: Pt Will Go Supine/Side To Sit Outcome: Progressing Flowsheets (Taken 04/07/2018 0909) Pt will go Supine/Side to Sit: with min guard assist Goal: Patient Will Transfer Sit To/From Stand Outcome: Progressing Flowsheets (Taken 04/07/2018 0909) Patient will transfer sit to/from stand: with min guard assist Goal: Pt Will Transfer Bed To Chair/Chair To Bed Outcome: Progressing Flowsheets (Taken 04/07/2018 0909) Pt will Transfer Bed to Chair/Chair to Bed: min guard assist Goal: Pt Will Ambulate Outcome: Progressing Flowsheets (Taken 04/07/2018 0909) Pt will Ambulate: 75 feet; with min guard assist; with rolling walker   9:11 AM, 04/07/18 Lonell Grandchild, MPT Physical Therapist with [facility] [phone] office [phone] mobile phone

## 2018-04-08 LAB — BASIC METABOLIC PANEL
Anion gap: 8 (ref 5–15)
BUN: 12 mg/dL (ref 8–23)
CO2: 28 mmol/L (ref 22–32)
Calcium: 8.2 mg/dL — ABNORMAL LOW (ref 8.9–10.3)
Chloride: 95 mmol/L — ABNORMAL LOW (ref 98–111)
Creatinine, Ser: 0.63 mg/dL (ref 0.44–1.00)
GFR calc Af Amer: >60 mL/min (ref >60)
GFR calc non Af Amer: >60 mL/min (ref >60)
Glucose, Bld: 107 mg/dL — ABNORMAL HIGH (ref 70–99)
Potassium: 4.2 mmol/L (ref 3.5–5.1)
Sodium: 131 mmol/L — ABNORMAL LOW (ref 135–145)

## 2018-04-08 LAB — CBC
HCT: 28.9 % — ABNORMAL LOW (ref 36.0–46.0)
Hemoglobin: 8.8 g/dL — ABNORMAL LOW (ref 12.0–15.0)
MCH: 26.2 pg (ref 26.0–34.0)
MCHC: 30.4 g/dL (ref 30.0–36.0)
MCV: 86 fL (ref 80.0–100.0)
Platelets: 377 10*3/uL (ref 150–400)
RBC: 3.36 MIL/uL — ABNORMAL LOW (ref 3.87–5.11)
RDW: 13.8 % (ref 11.5–15.5)
WBC: 11.8 10*3/uL — ABNORMAL HIGH (ref 4.0–10.5)
nRBC: 0 % (ref 0.0–0.2)

## 2018-04-08 LAB — CULTURE, BLOOD (ROUTINE X 2)
Culture: NO GROWTH
Culture: NO GROWTH
Special Requests: ADEQUATE
Special Requests: ADEQUATE

## 2018-04-08 MED ORDER — TRAMADOL HCL 50 MG PO TABS
100.0000 mg | ORAL_TABLET | Freq: Four times a day (QID) | ORAL | Status: DC
  Administered 2018-04-08 – 2018-04-09 (×4): 100 mg via ORAL
  Filled 2018-04-08 (×4): qty 2

## 2018-04-08 NOTE — Progress Notes (Signed)
Subjective: She says she feels okay.  She has no new complaints.  She is having some trouble with what may be thrush in her mouth and that is being treated.  She had urinary retention and Foley catheter has been replaced.  Her Foley with that was taken out per protocol after surgery but she had previously had urinary retention so she is going to go home with Foley catheter for now.  She is still working with physical therapy.  Objective: Vital signs in last 24 hours: Temp:  [98.3 F (36.8 C)-98.5 F (36.9 C)] 98.3 F (36.8 C) (04/04 0528) Pulse Rate:  [86-94] 88 (04/04 0528) Resp:  [16] 16 (04/04 0528) BP: (109-122)/(70-74) 122/73 (04/04 0528) SpO2:  [93 %-98 %] 93 % (04/04 0723) Weight:  [52.9 kg] 52.9 kg (04/03 2025) Weight change:  Last BM Date: 04/04/18  Intake/Output from previous day: 04/03 0701 - 04/04 0700 In: 960 [P.O.:960] Out: 150 [Urine:150]  PHYSICAL EXAM General appearance: alert, cooperative and mild distress Resp: clear to auscultation bilaterally Cardio: regular rate and rhythm, S1, S2 normal, no murmur, click, rub or gallop GI: soft, non-tender; bowel sounds normal; no masses,  no organomegaly Extremities: Surgical site looks okay  Lab Results:  Results for orders placed or performed during the hospital encounter of 04/05/18 (from the past 48 hour(s))  CBC     Status: Abnormal   Collection Time: 04/07/18  4:19 AM  Result Value Ref Range   WBC 13.4 (H) 4.0 - 10.5 K/uL   RBC 3.26 (L) 3.87 - 5.11 MIL/uL   Hemoglobin 8.6 (L) 12.0 - 15.0 g/dL   HCT 28.5 (L) 36.0 - 46.0 %   MCV 87.4 80.0 - 100.0 fL   MCH 26.4 26.0 - 34.0 pg   MCHC 30.2 30.0 - 36.0 g/dL   RDW 13.8 11.5 - 15.5 %   Platelets 349 150 - 400 K/uL   nRBC 0.0 0.0 - 0.2 %    Comment: Performed at [facility], [address] [place], [place] [address]  Basic metabolic panel     Status: Abnormal   Collection Time: 04/07/18  4:19 AM  Result Value Ref Range   Sodium 136 135 - 145 mmol/L   Potassium  3.8 3.5 - 5.1 mmol/L   Chloride 98 98 - 111 mmol/L   CO2 28 22 - 32 mmol/L   Glucose, Bld 106 (H) 70 - 99 mg/dL   BUN 17 8 - 23 mg/dL   Creatinine, Ser 0.81 0.44 - 1.00 mg/dL   Calcium 8.0 (L) 8.9 - 10.3 mg/dL   GFR calc non Af Amer >60 >60 mL/min   GFR calc Af Amer >60 >60 mL/min   Anion gap 10 5 - 15    Comment: Performed at [facility], [address], [place], [place] [address]  CBC     Status: Abnormal   Collection Time: 04/08/18  6:04 AM  Result Value Ref Range   WBC 11.8 (H) 4.0 - 10.5 K/uL   RBC 3.36 (L) 3.87 - 5.11 MIL/uL   Hemoglobin 8.8 (L) 12.0 - 15.0 g/dL   HCT 28.9 (L) 36.0 - 46.0 %   MCV 86.0 80.0 - 100.0 fL   MCH 26.2 26.0 - 34.0 pg   MCHC 30.4 30.0 - 36.0 g/dL   RDW 13.8 11.5 - 15.5 %   Platelets 377 150 - 400 K/uL   nRBC 0.0 0.0 - 0.2 %    Comment: Performed at [facility], [address] [place],  [place] [address]  Basic metabolic panel     Status: Abnormal   Collection Time: 04/08/18  6:04 AM  Result Value Ref Range   Sodium 131 (L) 135 - 145 mmol/L   Potassium 4.2 3.5 - 5.1 mmol/L   Chloride 95 (L) 98 - 111 mmol/L   CO2 28 22 - 32 mmol/L   Glucose, Bld 107 (H) 70 - 99 mg/dL   BUN 12 8 - 23 mg/dL   Creatinine, Ser 0.63 0.44 - 1.00 mg/dL   Calcium 8.2 (L) 8.9 - 10.3 mg/dL   GFR calc non Af Amer >60 >60 mL/min   GFR calc Af Amer >60 >60 mL/min   Anion gap 8 5 - 15    Comment: Performed at [facility], [address], [place], [place] [address]    ABGS No results for input(s): PHART, PO2ART, TCO2, HCO3 in the last 72 hours.  Invalid input(s): PCO2 CULTURES Recent Results (from the past 240 hour(s))  Culture, blood (routine x 2)     Status: None   Collection Time: 04/03/18 11:36 AM  Result Value Ref Range Status   Specimen Description BLOOD RIGHT ARM  Final   Special Requests   Final    BOTTLES DRAWN AEROBIC AND ANAEROBIC Blood Culture adequate volume   Culture   Final    NO GROWTH 5 DAYS Performed at [facility], [address],  [place], [place] [address]    Report Status 04/08/2018 FINAL  Final  Culture, blood (routine x 2)     Status: None   Collection Time: 04/03/18 11:42 AM  Result Value Ref Range Status   Specimen Description BLOOD RIGHT HAND  Final   Special Requests   Final    BOTTLES DRAWN AEROBIC AND ANAEROBIC Blood Culture adequate volume   Culture   Final    NO GROWTH 5 DAYS Performed at [facility], [address], [place], [place] [address]    Report Status 04/08/2018 FINAL  Final  Surgical pcr screen     Status: None   Collection Time: 04/06/18  1:18 AM  Result Value Ref Range Status   MRSA, PCR NEGATIVE NEGATIVE Final   Staphylococcus aureus NEGATIVE NEGATIVE Final    Comment: (NOTE) The Xpert SA Assay (FDA approved for NASAL specimens in patients 71 years of age and older), is one component of a comprehensive surveillance program. It is not intended to diagnose infection nor to guide or monitor treatment. Performed at [facility], [address], [place], [place] [address]    Studies/Results: Dg C-arm 1-60 Min  Result Date: 04/06/2018 CLINICAL DATA:  Fracture EXAM: OPERATIVE RIGHT HIP (WITH PELVIS IF PERFORMED) 7 VIEWS TECHNIQUE: Fluoroscopic spot image(s) were submitted for interpretation post-operatively. COMPARISON:  04/05/2018 FINDINGS: Multiple intraoperative spot images demonstrate placement of 3 screws across the right femoral neck fracture. No hardware complicating feature. Anatomic alignment. IMPRESSION: Internal fixation across the right femoral neck fracture. No complicating feature. Electronically Signed   By: Rolm Baptise M.D.   On: 04/06/2018 11:05   Dg Hip Operative Unilat With Pelvis Right  Result Date: 04/06/2018 CLINICAL DATA:  Fracture EXAM: OPERATIVE RIGHT HIP (WITH PELVIS IF PERFORMED) 7 VIEWS TECHNIQUE: Fluoroscopic spot image(s) were submitted for interpretation post-operatively. COMPARISON:  04/05/2018 FINDINGS: Multiple intraoperative spot images demonstrate placement of 3  screws across the right femoral neck fracture. No hardware complicating feature. Anatomic alignment. IMPRESSION: Internal fixation across the right femoral neck fracture. No complicating feature. Electronically Signed   By: Rolm Baptise M.D.  On: 04/06/2018 11:05    Medications:  Prior to Admission:  Medications Prior to Admission  Medication Sig Dispense Refill Last Dose  . albuterol (PROVENTIL) (2.5 MG/3ML) 0.083% nebulizer solution Take 2.5 mg by nebulization every 6 (six) hours as needed for wheezing or shortness of breath.   04/05/2018 at Unknown time  . Albuterol Sulfate (PROAIR HFA IN) Inhale 2 puffs into the lungs every 4 (four) hours as needed (4-6 hours).   04/03/2018 at Unknown time  . ALPRAZolam (XANAX) 1 MG tablet Take 1 mg by mouth 3 (three) times daily as needed for anxiety.    04/04/2018 at Unknown time  . cetirizine (ZYRTEC) 10 MG tablet Take 10 mg by mouth daily.   04/04/2018 at Unknown time  . furosemide (LASIX) 40 MG tablet Take 40 mg by mouth daily.   04/05/2018 at Unknown time  . pantoprazole (PROTONIX) 40 MG tablet TAKE (1) TABLET BY MOUTH TWICE A DAY WITH MEALS (BREAKFAST AND SUPPER) (Patient taking differently: Take 40 mg by mouth daily as needed. ) 60 tablet 3 04/04/2018 at Unknown time  . potassium chloride (K-DUR) 10 MEQ tablet Take 10 mEq by mouth daily.   04/05/2018 at Unknown time  . traMADol (ULTRAM) 50 MG tablet Take 100 mg by mouth every 4 (four) hours as needed.   04/05/2018 at Unknown time  . umeclidinium-vilanterol (ANORO ELLIPTA) 62.5-25 MCG/INH AEPB Inhale 1 puff into the lungs daily.   04/02/2018 at Unknown time  . VOLTAREN 1 % GEL Apply 1 g topically daily as needed. pain   04/02/2018 at Unknown time  . zolpidem (AMBIEN) 10 MG tablet Take 5 mg by mouth at bedtime as needed for sleep.    04/04/2018 at Unknown time  . ipratropium (ATROVENT HFA) 17 MCG/ACT inhaler Inhale 2 puffs into the lungs every 4 (four) hours as needed for wheezing. (Patient not taking: Reported on  04/03/2018) 1 Inhaler 12 Not Taking at Unknown time  . predniSONE (DELTASONE) 10 MG tablet Take 2 tablets (20 mg total) by mouth daily. (Patient not taking: Reported on 04/03/2018) 14 tablet 0 Not Taking at Unknown time   Scheduled: . aspirin EC  325 mg Oral Q breakfast  . calcium-vitamin D  1 tablet Oral TID  . docusate sodium  100 mg Oral BID  . feeding supplement (ENSURE ENLIVE)  237 mL Oral BID BM  . ipratropium-albuterol  3 mL Nebulization QID  . magic mouthwash  5 mL Oral QID  . pantoprazole  40 mg Oral Daily  . traMADol  100 mg Oral Q6H  . umeclidinium-vilanterol  1 puff Inhalation Daily   Continuous: . sodium chloride 50 mL/hr at 04/06/18 1119  . methocarbamol (ROBAXIN) IV     JOI:NOMVEHMCN, ALPRAZolam, alum & mag hydroxide-simeth, menthol-cetylpyridinium **OR** phenol, methocarbamol **OR** methocarbamol (ROBAXIN) IV, metoCLOPramide **OR** metoCLOPramide (REGLAN) injection, ondansetron **OR** ondansetron (ZOFRAN) IV, zolpidem  Assesment: She was admitted with right hip fracture which is been repaired.  Original plan was for her to go home today but there is going to be some delay because of difficulty with her family getting everything prepared.  She can continue with physical therapy which is getting ready to start again as I was leaving her room.  She has a little more trouble with pain so I have increased her tramadol to 100 mg.  She will use Magic mouthwash for her mouth problems Active Problems:   Osteoporosis   Dyspepsia   Closed right hip fracture (HCC)   GERD (gastroesophageal reflux disease)  COPD (chronic obstructive pulmonary disease) ([place])    Plan: Discharge home with home health services tomorrow    LOS: 3 days   [name] 04/08/2018, 9:30 AM

## 2018-04-08 NOTE — Progress Notes (Addendum)
Physical Therapy Treatment Patient Details Name: [name] MRN: [id number] DOB: [birth year] Today's Date: 04/08/2018    Gait: 53 feet Min A with RW    History of Present Illness [name]  is a 84 y.o. female, s/p ORIF Right hip fracture 04/06/18, has medical history of COPD, osteoporosis, GERD, patient presents to ED status post fall with right hip pain, patient reports she was going from her bedroom to the bathroom overnight, reports she did trip, fall in the right hip, denies any head trauma, she denies any dizziness, lightheadedness, loss of consciousness preceding this fall, patient was in ED 1 day before secondary to urinary retention, where she had Foley catheter inserted, patient reports history of COPD, but she denies any dyspnea, chest pain.    PT Comments    Patient was found awake and alert in bed with MD and RN in room. Therapist waited for nurse to provide medication and then proceeded with treatment session. Patient reported feeling nauseated once sitting at the EOB. Patient required assistance for transfers and to ambulate 60 feet with intermittent pauses due to fatigue. Therapist recommended and educated the patient on the benefits of sitting up in the chair, however patient requested to return to bed to take a nap as she stated she had not slept well the night before. Patient would continue to benefit from skilled physical therapy in order to continue progressing with gait, transfers, strength, and overall functional mobility.    Follow Up Recommendations  SNF;Supervision for mobility/OOB;Supervision/Assistance - 24 hour     Equipment Recommendations  Rolling walker with 5" wheels    Recommendations for Other Services       Precautions / Restrictions Precautions Precautions: Fall Restrictions Weight Bearing Restrictions: Yes RLE Weight Bearing: Weight bearing as tolerated    Mobility  Bed Mobility Overal bed mobility: Needs Assistance Bed Mobility: Supine to  Sit;Sit to Supine     Supine to sit: Min guard;Min assist Sit to supine: Min assist   General bed mobility comments: requires assistance to move RLE due to pain/weakness, assistance to scoot toward EOB  Transfers Overall transfer level: Needs assistance Equipment used: Rolling walker (2 wheeled) Transfers: Sit to/from Stand Sit to Stand: Min assist         General transfer comment: slow labored movement; verbal cues for hand placement on the bed with transfer  Ambulation/Gait Ambulation/Gait assistance: Min assist Gait Distance (Feet): 60 Feet Assistive device: Rolling walker (2 wheeled) Gait Pattern/deviations: Decreased step length - right;Decreased step length - left;Decreased stance time - right;Decreased stride length Gait velocity: decreased   General Gait Details: slightly labored slow cadence with fair/good return for right heel to toe stepping without loss of balance, limited mostly due to fatigue and reported feeling nauseated    Stairs             Wheelchair Mobility    Modified Rankin (Stroke Patients Only)       Balance Overall balance assessment: Needs assistance Sitting-balance support: Feet supported Sitting balance-Leahy Scale: Good     Standing balance support: Bilateral upper extremity supported;During functional activity Standing balance-Leahy Scale: Fair Standing balance comment: using RW                            Cognition Arousal/Alertness: Awake/alert Behavior During Therapy: WFL for tasks assessed/performed Overall Cognitive Status: Within Functional Limits for tasks assessed  Exercises      General Comments        Pertinent Vitals/Pain Pain Assessment: No/denies pain Pain Descriptors / Indicators: Other (Comment)(Patient reported feeling nauseated during therapy session) Pain Intervention(s): Monitored during session;Limited activity within patient's  tolerance    Home Living                      Prior Function            PT Goals (current goals can now be found in the care plan section) Acute Rehab PT Goals Patient Stated Goal: return home with family to assist PT Goal Formulation: With patient Time For Goal Achievement: 04/21/18 Potential to Achieve Goals: Good Progress towards PT goals: Progressing toward goals    Frequency    7X/week      PT Plan Current plan remains appropriate    Co-evaluation              AM-PAC PT "6 Clicks" Mobility   Outcome Measure  Help needed turning from your back to your side while in a flat bed without using bedrails?: A Little Help needed moving from lying on your back to sitting on the side of a flat bed without using bedrails?: A Little Help needed moving to and from a bed to a chair (including a wheelchair)?: A Little Help needed standing up from a chair using your arms (e.g., wheelchair or bedside chair)?: A Little Help needed to walk in hospital room?: A Little Help needed climbing 3-5 steps with a railing? : A Lot 6 Click Score: 17    End of Session Equipment Utilized During Treatment: Gait belt Activity Tolerance: Patient tolerated treatment well;Patient limited by fatigue Patient left: with call bell/phone within reach;in bed;with bed alarm set Nurse Communication: Mobility status;Other (comment)(Patient reported feeling nauseated) PT Visit Diagnosis: Unsteadiness on feet (R26.81);Other abnormalities of gait and mobility (R26.89);Muscle weakness (generalized) (M62.81)     Time: 3149-7026 PT Time Calculation (min) (ACUTE ONLY): 31 min  Charges:  $Therapeutic Activity: 23-37 mins              (Time RN was providing medication, was included in charges)       Clarene Critchley PT, DPT 9:48 AM, 04/08/18 [phone]

## 2018-04-08 NOTE — Progress Notes (Signed)
Assist of two with walker to chair.  Dressing to hip dry and intact.  Sacral dressing applied for protection.  Alert and oriented with some forgetfulness.  Did not remember talking to Dr. Luan Pulling this morning.

## 2018-04-08 NOTE — Progress Notes (Signed)
No BM since Wednesday.  Getting colace daily.  Epaged Dr. Luan Pulling twice with this information.

## 2018-04-08 NOTE — Progress Notes (Signed)
Patient ID: [name], female   DOB: 03/11/1934, 84 y.o.   MRN: [id number] BP 122/73 (BP Location: Left Arm)   Pulse 88   Temp 98.3 F (36.8 C) (Oral)   Resp 16   Ht 5\' 3"  (1.6 m)   Wt 52.9 kg   SpO2 93%   BMI 20.67 kg/m   CBC    Component Value Date/Time   WBC 11.8 (H) 04/08/2018 0604   RBC 3.36 (L) 04/08/2018 0604   HGB 8.8 (L) 04/08/2018 0604   HCT 28.9 (L) 04/08/2018 0604   PLT 377 04/08/2018 0604   MCV 86.0 04/08/2018 0604   MCH 26.2 04/08/2018 0604   MCHC 30.4 04/08/2018 0604   RDW 13.8 04/08/2018 0604   LYMPHSABS 0.8 04/05/2018 1439   MONOABS 1.3 (H) 04/05/2018 1439   EOSABS 0.1 04/05/2018 1439   BASOSABS 0.1 04/05/2018 1439    BMP Latest Ref Rng & Units 04/08/2018 04/07/2018 04/06/2018  Glucose 70 - 99 mg/dL 107(H) 106(H) 109(H)  BUN 8 - 23 mg/dL 12 17 18   Creatinine 0.44 - 1.00 mg/dL 0.63 0.81 0.85  Sodium 135 - 145 mmol/L 131(L) 136 135  Potassium 3.5 - 5.1 mmol/L 4.2 3.8 4.1  Chloride 98 - 111 mmol/L 95(L) 98 94(L)  CO2 22 - 32 mmol/L 28 28 29   Calcium 8.9 - 10.3 mg/dL 8.2(L) 8.0(L) 8.5(L)    WBAT DVT- ASPIRIN 28 DAYS  STAPLES OUT POD 12-14 FU 1 MONTH

## 2018-04-09 DIAGNOSIS — Z466 Encounter for fitting and adjustment of urinary device: Secondary | ICD-10-CM | POA: Diagnosis not present

## 2018-04-09 DIAGNOSIS — J449 Chronic obstructive pulmonary disease, unspecified: Secondary | ICD-10-CM | POA: Diagnosis not present

## 2018-04-09 DIAGNOSIS — M80051D Age-related osteoporosis with current pathological fracture, right femur, subsequent encounter for fracture with routine healing: Secondary | ICD-10-CM | POA: Diagnosis not present

## 2018-04-09 DIAGNOSIS — Z9181 History of falling: Secondary | ICD-10-CM | POA: Diagnosis not present

## 2018-04-09 DIAGNOSIS — K449 Diaphragmatic hernia without obstruction or gangrene: Secondary | ICD-10-CM | POA: Diagnosis not present

## 2018-04-09 DIAGNOSIS — K5900 Constipation, unspecified: Secondary | ICD-10-CM

## 2018-04-09 DIAGNOSIS — R339 Retention of urine, unspecified: Secondary | ICD-10-CM | POA: Diagnosis present

## 2018-04-09 DIAGNOSIS — E785 Hyperlipidemia, unspecified: Secondary | ICD-10-CM | POA: Diagnosis not present

## 2018-04-09 DIAGNOSIS — K5909 Other constipation: Secondary | ICD-10-CM

## 2018-04-09 LAB — CBC
HCT: 29.3 % — ABNORMAL LOW (ref 36.0–46.0)
Hemoglobin: 9.1 g/dL — ABNORMAL LOW (ref 12.0–15.0)
MCH: 26.6 pg (ref 26.0–34.0)
MCHC: 31.1 g/dL (ref 30.0–36.0)
MCV: 85.7 fL (ref 80.0–100.0)
Platelets: 404 10*3/uL — ABNORMAL HIGH (ref 150–400)
RBC: 3.42 MIL/uL — ABNORMAL LOW (ref 3.87–5.11)
RDW: 13.9 % (ref 11.5–15.5)
WBC: 11.7 10*3/uL — ABNORMAL HIGH (ref 4.0–10.5)
nRBC: 0 % (ref 0.0–0.2)

## 2018-04-09 MED ORDER — DOCUSATE SODIUM 100 MG PO CAPS: 100.0000 mg | ORAL_CAPSULE | Freq: Two times a day (BID) | ORAL | 0 refills | Status: DC

## 2018-04-09 MED ORDER — ALPRAZOLAM 1 MG PO TABS: 0.5000 mg | ORAL_TABLET | Freq: Three times a day (TID) | ORAL | 0 refills | Status: DC | PRN

## 2018-04-09 NOTE — Plan of Care (Signed)
Patient plan of care is progressing and is adequate for discharge at this time.

## 2018-04-09 NOTE — Discharge Summary (Signed)
Physician Discharge Summary  Patient ID: [name] MRN: [id number] DOB/AGE: Jul 08, 1934 84 y.o. Primary Care Physician:Prentice Sackrider, Percell Miller, MD Admit date: 04/05/2018 Discharge date: 04/09/2018    Discharge Diagnoses:   Active Problems:   Osteoporosis   Dyspepsia   Closed right hip fracture (HCC)   GERD (gastroesophageal reflux disease)   COPD (chronic obstructive pulmonary disease) (HCC)   Constipation   Urinary retention   Allergies as of 04/09/2018      Reactions   Codeine Shortness Of Breath, Rash   Codeine Anaphylaxis   Levofloxacin Anaphylaxis   Patient was admitted to hospital with lung problems due to this medication   Sulfonamide Derivatives Hives, Shortness Of Breath   Sulfa Antibiotics Hives      Medication List    STOP taking these medications   predniSONE 10 MG tablet Commonly known as:  DELTASONE     TAKE these medications   albuterol (2.5 MG/3ML) 0.083% nebulizer solution Commonly known as:  PROVENTIL Take 2.5 mg by nebulization every 6 (six) hours as needed for wheezing or shortness of breath.   PROAIR HFA IN Inhale 2 puffs into the lungs every 4 (four) hours as needed (4-6 hours).   ALPRAZolam 1 MG tablet Commonly known as:  XANAX Take 0.5 tablets (0.5 mg total) by mouth 3 (three) times daily as needed for anxiety. What changed:  how much to take   cetirizine 10 MG tablet Commonly known as:  ZYRTEC Take 10 mg by mouth daily.   docusate sodium 100 MG capsule Commonly known as:  COLACE Take 1 capsule (100 mg total) by mouth 2 (two) times daily.   furosemide 40 MG tablet Commonly known as:  LASIX Take 40 mg by mouth daily.   ipratropium 17 MCG/ACT inhaler Commonly known as:  Atrovent HFA Inhale 2 puffs into the lungs every 4 (four) hours as needed for wheezing.   pantoprazole 40 MG tablet Commonly known as:  PROTONIX TAKE (1) TABLET BY MOUTH TWICE A DAY WITH MEALS (BREAKFAST AND SUPPER) What changed:  See the new instructions.   potassium  chloride 10 MEQ tablet Commonly known as:  K-DUR Take 10 mEq by mouth daily.   traMADol 50 MG tablet Commonly known as:  ULTRAM Take 100 mg by mouth every 4 (four) hours as needed.   umeclidinium-vilanterol 62.5-25 MCG/INH Aepb Commonly known as:  ANORO ELLIPTA Inhale 1 puff into the lungs daily.   Voltaren 1 % Gel Generic drug:  diclofenac sodium Apply 1 g topically daily as needed. pain   zolpidem 10 MG tablet Commonly known as:  AMBIEN Take 5 mg by mouth at bedtime as needed for sleep.            Durable Medical Equipment  (From admission, onward)         Start     Ordered   04/07/18 1231  For home use only DME Walker  Once    Comments:  Rolling  Question:  Patient needs a walker to treat with the following condition  Answer:  Hip fracture requiring operative repair, right, closed, with routine healing, subsequent encounter   04/07/18 1231   04/07/18 1212  For home use only DME Walker rolling  Once    Question:  Patient needs a walker to treat with the following condition  Answer:  Hip fracture ([place])   04/07/18 1212          Discharged Condition: Improved    Consults: Orthopedics, Dr. Aline Brochure  Significant Diagnostic Studies: Dg  Lumbar Spine Complete  Result Date: 04/05/2018 CLINICAL DATA:  84 year old female with right hip and lower back pain after falling at home earlier today EXAM: LUMBAR SPINE - COMPLETE 4+ VIEW COMPARISON:  Prior CT scan of the abdomen and pelvis 04/03/2018 FINDINGS: Rotary and dextroconvex scoliosis centered at L3. The bones are diffusely osteopenic in appearance. Radiographs are extremely limited secondary to low bone density. Visualized bowel gas pattern is unremarkable. IMPRESSION: The bones are significantly demineralized which severely limits the diagnostic accuracy of this examination, particularly in light of underlying scoliosis. If there is persistent clinical concern for lumbar spinal fracture, recommend further evaluation with  MRI. Rotary and dextroconvex scoliosis centered at L3. Electronically Signed   By: Jacqulynn Cadet M.D.   On: 04/05/2018 14:04   Ct Head Wo Contrast  Result Date: 04/03/2018 CLINICAL DATA:  Confusion, acute, unexplained. EXAM: CT HEAD WITHOUT CONTRAST TECHNIQUE: Contiguous axial images were obtained from the base of the skull through the vertex without intravenous contrast. COMPARISON:  None. FINDINGS: Brain: Advanced atrophy and diffuse white matter disease is present. No acute cortical infarct is present. Basal ganglia are intact. Brainstem and cerebellum are within normal limits. The ventricles are of proportionate to the degree of atrophy. No significant extraaxial fluid collection is present. Vascular: Atherosclerotic changes are present within the cavernous internal carotid arteries. There is no asymmetric hyperdense vessel. Skull: Calvarium is intact. No focal lytic or blastic lesions are present. Sinuses/Orbits: The paranasal sinuses and mastoid air cells are clear. The globes and orbits are within normal limits. IMPRESSION: 1. No acute intracranial abnormality. 2. Extensive white matter disease. This likely reflects the sequela of chronic microvascular ischemia. Electronically Signed   By: San Morelle M.D.   On: 04/03/2018 16:50   Ct Abdomen Pelvis W Contrast  Result Date: 04/03/2018 CLINICAL DATA:  Abdominal infection. EXAM: CT ABDOMEN AND PELVIS WITH CONTRAST TECHNIQUE: Multidetector CT imaging of the abdomen and pelvis was performed using the standard protocol following bolus administration of intravenous contrast. CONTRAST:  67mL OMNIPAQUE IOHEXOL 300 MG/ML SOLN orally, 27mL OMNIPAQUE IOHEXOL 300 MG/ML SOLN intravenously. COMPARISON:  CT scan of April 21, 2011. FINDINGS: Lower chest: Large sliding-type hiatal hernia is noted. Visualized lung bases are unremarkable. Hepatobiliary: No focal liver abnormality is seen. No gallstones, gallbladder wall thickening, or biliary dilatation.  Pancreas: Unremarkable. No pancreatic ductal dilatation or surrounding inflammatory changes. Spleen: Normal in size without focal abnormality. Adrenals/Urinary Tract: Adrenal glands appear normal. Nonobstructive left renal calculus is noted. No hydronephrosis or renal obstruction is noted. Foley catheter is noted in urinary bladder. Stomach/Bowel: There is no evidence of bowel obstruction or inflammation. The appendix is unremarkable. Vascular/Lymphatic: Aortic atherosclerosis. No enlarged abdominal or pelvic lymph nodes. Reproductive: Uterus and bilateral adnexa are unremarkable. Other: No abdominal wall hernia or abnormality. No abdominopelvic ascites. Musculoskeletal: No acute or significant osseous findings. IMPRESSION: Large sliding-type hiatal hernia. Nonobstructive left renal calculus. No hydronephrosis or renal obstruction is noted. No other acute abnormality seen in the abdomen or pelvis. Aortic Atherosclerosis (ICD10-I70.0). Electronically Signed   By: Marijo Conception, M.D.   On: 04/03/2018 14:13   Dg Chest Port 1 View  Result Date: 04/05/2018 CLINICAL DATA:  Preop for hip surgery. EXAM: PORTABLE CHEST 1 VIEW COMPARISON:  02/17/2018 FINDINGS: Cardiac silhouette is normal in size. Moderate hiatal hernia. No mediastinal or hilar masses. Prominent bronchovascular and interstitial markings bilaterally similar to prior exams. Additional left lung base opacity is most likely atelectasis. No convincing pneumonia or pulmonary edema. No visualized  pleural effusion and no pneumothorax. Skeletal structures are demineralized but grossly intact IMPRESSION: No acute cardiopulmonary disease. Electronically Signed   By: Lajean Manes M.D.   On: 04/05/2018 14:28   Dg C-arm 1-60 Min  Result Date: 04/06/2018 CLINICAL DATA:  Fracture EXAM: OPERATIVE RIGHT HIP (WITH PELVIS IF PERFORMED) 7 VIEWS TECHNIQUE: Fluoroscopic spot image(s) were submitted for interpretation post-operatively. COMPARISON:  04/05/2018 FINDINGS:  Multiple intraoperative spot images demonstrate placement of 3 screws across the right femoral neck fracture. No hardware complicating feature. Anatomic alignment. IMPRESSION: Internal fixation across the right femoral neck fracture. No complicating feature. Electronically Signed   By: Rolm Baptise M.D.   On: 04/06/2018 11:05   Dg Hip Operative Unilat With Pelvis Right  Result Date: 04/06/2018 CLINICAL DATA:  Fracture EXAM: OPERATIVE RIGHT HIP (WITH PELVIS IF PERFORMED) 7 VIEWS TECHNIQUE: Fluoroscopic spot image(s) were submitted for interpretation post-operatively. COMPARISON:  04/05/2018 FINDINGS: Multiple intraoperative spot images demonstrate placement of 3 screws across the right femoral neck fracture. No hardware complicating feature. Anatomic alignment. IMPRESSION: Internal fixation across the right femoral neck fracture. No complicating feature. Electronically Signed   By: Rolm Baptise M.D.   On: 04/06/2018 11:05   Dg Hip Unilat W Or Wo Pelvis 2-3 Views Right  Result Date: 04/05/2018 CLINICAL DATA:  Pt states she fell at home onset today and is c/o right hip pain and lower back pain. Pt denies any surgery to lower back or right hip. EXAM: DG HIP (WITH OR WITHOUT PELVIS) 2-3V RIGHT COMPARISON:  None. FINDINGS: There is a nondisplaced, non comminuted subcapital fracture of the right femoral neck with mild valgus angulation. No other fractures.  No bone lesions. Hip joints, SI joints and pubic symphysis are normally spaced and aligned. Skeletal structures are demineralized. Mild subcutaneous soft tissue edema lateral to the proximal right femur. IMPRESSION: 1. Nondisplaced, non comminuted subcapital fracture of the right femoral neck with mild valgus angulation. Electronically Signed   By: Lajean Manes M.D.   On: 04/05/2018 14:02    Lab Results: Basic Metabolic Panel: Recent Labs    04/07/18 0419 04/08/18 0604  NA 136 131*  K 3.8 4.2  CL 98 95*  CO2 28 28  GLUCOSE 106* 107*  BUN 17 12   CREATININE 0.81 0.63  CALCIUM 8.0* 8.2*   Liver Function Tests: No results for input(s): AST, ALT, ALKPHOS, BILITOT, PROT, ALBUMIN in the last 72 hours.   CBC: Recent Labs    04/08/18 0604 04/09/18 0620  WBC 11.8* 11.7*  HGB 8.8* 9.1*  HCT 28.9* 29.3*  MCV 86.0 85.7  PLT 377 404*    Recent Results (from the past 240 hour(s))  Culture, blood (routine x 2)     Status: None   Collection Time: 04/03/18 11:36 AM  Result Value Ref Range Status   Specimen Description BLOOD RIGHT ARM  Final   Special Requests   Final    BOTTLES DRAWN AEROBIC AND ANAEROBIC Blood Culture adequate volume   Culture   Final    NO GROWTH 5 DAYS Performed at [facility], [address], [place], [place] [address]    Report Status 04/08/2018 FINAL  Final  Culture, blood (routine x 2)     Status: None   Collection Time: 04/03/18 11:42 AM  Result Value Ref Range Status   Specimen Description BLOOD RIGHT HAND  Final   Special Requests   Final    BOTTLES DRAWN AEROBIC AND ANAEROBIC Blood Culture adequate volume   Culture  Final    NO GROWTH 5 DAYS Performed at [facility], [address], [place], [place] [address]    Report Status 04/08/2018 FINAL  Final  Surgical pcr screen     Status: None   Collection Time: 04/06/18  1:18 AM  Result Value Ref Range Status   MRSA, PCR NEGATIVE NEGATIVE Final   Staphylococcus aureus NEGATIVE NEGATIVE Final    Comment: (NOTE) The Xpert SA Assay (FDA approved for NASAL specimens in patients 72 years of age and older), is one component of a comprehensive surveillance program. It is not intended to diagnose infection nor to guide or monitor treatment. Performed at [facility], [address] [place], [place] [address]      Hospital Course: This is an 85 year old who fell at home and suffered right hip pain.  She came to the emergency department and was found to have a fracture.  She underwent surgery for the hip fracture the next day and was doing well.  She  participated with physical therapy.  It was recommended that she go to a skilled care facility but she refused and is going to go home with home health services.  She says she already has all of the medications including tramadol alprazolam and zolpidem.  Her pain has been well controlled.  She had trouble with constipation but that has resolved.  Discharge Exam: Blood pressure 116/70, pulse 94, temperature 100 F (37.8 C), temperature source Oral, resp. rate 18, height 5\' 3"  (1.6 m), weight 52.9 kg, SpO2 (!) 88 %. She is awake and alert.  No distress.  Hard of hearing.  Chest is clear.  Disposition: Home with home health services      Signed: Alonza Bogus   04/09/2018, 9:17 AM

## 2018-04-09 NOTE — TOC Transition Note (Signed)
Transition of Care [facility]) - CM/SW Discharge Note   Patient Details  Name: DAN DISSINGER MRN: [id number] Date of Birth: 1934/01/13  Transition of Care [facility]) CM/SW Contact:  Zettie Pho, LCSW Phone Number: 04/09/2018, 9:28 AM   Clinical Narrative:   The CSW updated Malachy Mood at [facility] that the patient will discharge home today with HHPT orders. The patient has a rollator at home; however, the [facility] staff had ordered a rolling walker to be delivered; the patient's attending RN confirmed that it was delivered. The patient will transport home by family transport. The [facility] team is signing off. Please consult should needs arise.    Final next level of care: [place] Barriers to Discharge: No Barriers Identified   Patient Goals and CMS Choice Patient states their goals for this hospitalization and ongoing recovery are:: Keep mom at home CMS Medicare.gov Compare Post Acute Care list provided to:: Patient Represenative (must comment)(Patient's daughter) Choice offered to / list presented to : [facility] POA / Guardian, Patient  Discharge Placement                       Discharge Plan and Services     Post Acute Care Choice: Home Health          DME Arranged: Walker rolling DME Agency: [facility] HH Arranged: PT [place] Agency: [facility]   Social Determinants of Health (SDOH) Interventions     Readmission Risk Interventions Readmission Risk Prevention Plan 04/07/2018  Transportation Screening Complete  PCP or Specialist Appt within 5-7 Days Complete  Home Care Screening Complete  Medication Review (RN CM) Complete  Some recent data might be hidden

## 2018-04-09 NOTE — Progress Notes (Signed)
Subjective: She says she feels okay.  She wants to go home.  She has no new complaints.  She will go home with a Foley catheter because of her urinary retention.  Her constipation resolved last night.  I was paged but never received any sort of message and I was unsuccessful in trying to find out who paged me.  Objective: Vital signs in last 24 hours: Temp:  [97.9 F (36.6 C)-100 F (37.8 C)] 100 F (37.8 C) (04/05 0604) Pulse Rate:  [87-94] 94 (04/05 0604) Resp:  [18] 18 (04/05 0604) BP: (116-131)/(70-83) 116/70 (04/05 0604) SpO2:  [88 %-98 %] 88 % (04/05 0604) Weight change:  Last BM Date: 04/05/18  Intake/Output from previous day: 04/04 0701 - 04/05 0700 In: -  Out: 1000 [Urine:1000]  PHYSICAL EXAM General appearance: alert, cooperative, no distress and Hard of hearing Resp: clear to auscultation bilaterally Cardio: regular rate and rhythm, S1, S2 normal, no murmur, click, rub or gallop GI: soft, non-tender; bowel sounds normal; no masses,  no organomegaly Extremities: extremities normal, atraumatic, no cyanosis or edema  Lab Results:  Results for orders placed or performed during the hospital encounter of 04/05/18 (from the past 48 hour(s))  CBC     Status: Abnormal   Collection Time: 04/08/18  6:04 AM  Result Value Ref Range   WBC 11.8 (H) 4.0 - 10.5 K/uL   RBC 3.36 (L) 3.87 - 5.11 MIL/uL   Hemoglobin 8.8 (L) 12.0 - 15.0 g/dL   HCT 28.9 (L) 36.0 - 46.0 %   MCV 86.0 80.0 - 100.0 fL   MCH 26.2 26.0 - 34.0 pg   MCHC 30.4 30.0 - 36.0 g/dL   RDW 13.8 11.5 - 15.5 %   Platelets 377 150 - 400 K/uL   nRBC 0.0 0.0 - 0.2 %    Comment: Performed at [facility], [address], [place], [place] [address]  Basic metabolic panel     Status: Abnormal   Collection Time: 04/08/18  6:04 AM  Result Value Ref Range   Sodium 131 (L) 135 - 145 mmol/L   Potassium 4.2 3.5 - 5.1 mmol/L   Chloride 95 (L) 98 - 111 mmol/L   CO2 28 22 - 32 mmol/L   Glucose, Bld 107 (H) 70 - 99 mg/dL   BUN  12 8 - 23 mg/dL   Creatinine, Ser 0.63 0.44 - 1.00 mg/dL   Calcium 8.2 (L) 8.9 - 10.3 mg/dL   GFR calc non Af Amer >60 >60 mL/min   GFR calc Af Amer >60 >60 mL/min   Anion gap 8 5 - 15    Comment: Performed at [facility], [address], [place], [place] [address]  CBC     Status: Abnormal   Collection Time: 04/09/18  6:20 AM  Result Value Ref Range   WBC 11.7 (H) 4.0 - 10.5 K/uL   RBC 3.42 (L) 3.87 - 5.11 MIL/uL   Hemoglobin 9.1 (L) 12.0 - 15.0 g/dL   HCT 29.3 (L) 36.0 - 46.0 %   MCV 85.7 80.0 - 100.0 fL   MCH 26.6 26.0 - 34.0 pg   MCHC 31.1 30.0 - 36.0 g/dL   RDW 13.9 11.5 - 15.5 %   Platelets 404 (H) 150 - 400 K/uL   nRBC 0.0 0.0 - 0.2 %    Comment: Performed at [facility], [address], [place], [place] [address]    ABGS No results for input(s): PHART, PO2ART, TCO2, HCO3 in the last 72 hours.  Invalid input(s): PCO2 CULTURES Recent Results (from the past 240 hour(s))  Culture, blood (routine x 2)     Status: None   Collection Time: 04/03/18 11:36 AM  Result Value Ref Range Status   Specimen Description BLOOD RIGHT ARM  Final   Special Requests   Final    BOTTLES DRAWN AEROBIC AND ANAEROBIC Blood Culture adequate volume   Culture   Final    NO GROWTH 5 DAYS Performed at [facility], [address], [place], [place] [address]    Report Status 04/08/2018 FINAL  Final  Culture, blood (routine x 2)     Status: None   Collection Time: 04/03/18 11:42 AM  Result Value Ref Range Status   Specimen Description BLOOD RIGHT HAND  Final   Special Requests   Final    BOTTLES DRAWN AEROBIC AND ANAEROBIC Blood Culture adequate volume   Culture   Final    NO GROWTH 5 DAYS Performed at [facility], [address], [place], [place] [address]    Report Status 04/08/2018 FINAL  Final  Surgical pcr screen     Status: None   Collection Time: 04/06/18  1:18 AM  Result Value Ref Range Status   MRSA, PCR NEGATIVE NEGATIVE Final   Staphylococcus aureus NEGATIVE NEGATIVE Final     Comment: (NOTE) The Xpert SA Assay (FDA approved for NASAL specimens in patients 31 years of age and older), is one component of a comprehensive surveillance program. It is not intended to diagnose infection nor to guide or monitor treatment. Performed at [facility], [address], [place], [place] [address]    Studies/Results: No results found.  Medications:  Prior to Admission:  Medications Prior to Admission  Medication Sig Dispense Refill Last Dose  . albuterol (PROVENTIL) (2.5 MG/3ML) 0.083% nebulizer solution Take 2.5 mg by nebulization every 6 (six) hours as needed for wheezing or shortness of breath.   04/05/2018 at Unknown time  . Albuterol Sulfate (PROAIR HFA IN) Inhale 2 puffs into the lungs every 4 (four) hours as needed (4-6 hours).   04/03/2018 at Unknown time  . cetirizine (ZYRTEC) 10 MG tablet Take 10 mg by mouth daily.   04/04/2018 at Unknown time  . furosemide (LASIX) 40 MG tablet Take 40 mg by mouth daily.   04/05/2018 at Unknown time  . pantoprazole (PROTONIX) 40 MG tablet TAKE (1) TABLET BY MOUTH TWICE A DAY WITH MEALS (BREAKFAST AND SUPPER) (Patient taking differently: Take 40 mg by mouth daily as needed. ) 60 tablet 3 04/04/2018 at Unknown time  . potassium chloride (K-DUR) 10 MEQ tablet Take 10 mEq by mouth daily.   04/05/2018 at Unknown time  . traMADol (ULTRAM) 50 MG tablet Take 100 mg by mouth every 4 (four) hours as needed.   04/05/2018 at Unknown time  . umeclidinium-vilanterol (ANORO ELLIPTA) 62.5-25 MCG/INH AEPB Inhale 1 puff into the lungs daily.   04/02/2018 at Unknown time  . VOLTAREN 1 % GEL Apply 1 g topically daily as needed. pain   04/02/2018 at Unknown time  . zolpidem (AMBIEN) 10 MG tablet Take 5 mg by mouth at bedtime as needed for sleep.    04/04/2018 at Unknown time  . [DISCONTINUED] ALPRAZolam (XANAX) 1 MG tablet Take 1 mg by mouth 3 (three) times daily as needed for anxiety.    04/04/2018 at Unknown time  . ipratropium (ATROVENT HFA) 17 MCG/ACT inhaler Inhale  2 puffs into the lungs every 4 (four) hours as needed for wheezing. (Patient not  taking: Reported on 04/03/2018) 1 Inhaler 12 Not Taking at Unknown time  . predniSONE (DELTASONE) 10 MG tablet Take 2 tablets (20 mg total) by mouth daily. (Patient not taking: Reported on 04/03/2018) 14 tablet 0 Not Taking at Unknown time   Scheduled: . aspirin EC  325 mg Oral Q breakfast  . calcium-vitamin D  1 tablet Oral TID  . docusate sodium  100 mg Oral BID  . feeding supplement (ENSURE ENLIVE)  237 mL Oral BID BM  . ipratropium-albuterol  3 mL Nebulization QID  . magic mouthwash  5 mL Oral QID  . pantoprazole  40 mg Oral Daily  . traMADol  100 mg Oral Q6H  . umeclidinium-vilanterol  1 puff Inhalation Daily   Continuous: . sodium chloride 50 mL/hr at 04/06/18 1119  . methocarbamol (ROBAXIN) IV     BDZ:[id number], ALPRAZolam, alum & mag hydroxide-simeth, menthol-cetylpyridinium **OR** phenol, methocarbamol **OR** methocarbamol (ROBAXIN) IV, metoCLOPramide **OR** metoCLOPramide (REGLAN) injection, ondansetron **OR** ondansetron (ZOFRAN) IV, zolpidem  Assesment: She was admitted with closed right hip fracture.  She had been in the emergency department earlier this week with urinary retention.  Her Foley catheter was removed per protocol but she still had urinary retention so she will have to go home with a catheter.  As far as her fracture is doing she is doing well.  She is participating with physical therapy.  This will continue as an outpatient.  It had been recommended that she go to skilled care facility but she has refused that  She has COPD at baseline which is pretty stable  She has osteoporosis and she has been on treatment but did not tolerate alendronate  She has anxiety which is ongoing  He has constipation which has resolved Active Problems:   Osteoporosis   Dyspepsia   Closed right hip fracture (HCC)   GERD (gastroesophageal reflux disease)   COPD (chronic obstructive pulmonary disease)  ([place])   Constipation   Urinary retention    Plan: Discharge home today    LOS: 4 days   [name] 04/09/2018, 9:14 AM

## 2018-04-09 NOTE — Progress Notes (Signed)
Physical Therapy Treatment Patient Details Name: [name] MRN: [id number] DOB: 08/31/1934 Today's Date: 04/09/2018    History of Present Illness [name]  is a 84 y.o. female, s/p ORIF Right hip fracture 04/06/18, has medical history of COPD, osteoporosis, GERD, patient presents to ED status post fall with right hip pain, patient reports she was going from her bedroom to the bathroom overnight, reports she did trip, fall in the right hip, denies any head trauma, she denies any dizziness, lightheadedness, loss of consciousness preceding this fall, patient was in ED 1 day before secondary to urinary retention, where she had Foley catheter inserted, patient reports history of COPD, but she denies any dyspnea, chest pain.    PT Comments    Patient was found awake and alert in the bed upon entering. Patient consented to transfer from the bed to the bedside chair, but did not wish to ambulate further. Patient was agreeable to perform chair exercises to improve AROM and strength with therapist providing some assistance for full knee extension with LAQ on the right side. Then patient requested to use the commode. Patient required cueing to maintain body close to the walker with ambulation, but responded well to verbal cues and commands. Patient refused ambulating further following toileting. Patient required mod A with transfer from commode back to bedside chair. Patient would benefit from continued skilled physical therapy in order to continue progressing, strength, balance, ambulation, and overall functional mobility. Recommend patient either got to SNF or home with 24 hour assistance or supervision with caregiver able to provide assistance.   Follow Up Recommendations  SNF;Supervision for mobility/OOB;Supervision/Assistance - 24 hour     Equipment Recommendations  Rolling walker with 5" wheels    Recommendations for Other Services       Precautions / Restrictions Precautions Precautions:  Fall Restrictions Weight Bearing Restrictions: Yes RLE Weight Bearing: Weight bearing as tolerated    Mobility  Bed Mobility Overal bed mobility: Needs Assistance Bed Mobility: Supine to Sit     Supine to sit: Mod assist     General bed mobility comments: requires assistance to move RLE due to pain/weakness, assistance to scoot toward EOB mod A this session  Transfers Overall transfer level: Needs assistance Equipment used: Rolling walker (2 wheeled) Transfers: Sit to/from Stand Sit to Stand: Min assist;Mod assist         General transfer comment: slow labored movement; verbal cues for hand placement  Ambulation/Gait Ambulation/Gait assistance: Min assist Gait Distance (Feet): 20 Feet Assistive device: Rolling walker (2 wheeled) Gait Pattern/deviations: Decreased step length - right;Decreased step length - left;Decreased stance time - right;Decreased stride length Gait velocity: decreased   General Gait Details: slightly labored slow cadence. Patient participated in ambulation and transfer from bed to chair and from the chair to and from the commode, but refused any further ambulation. Patient required frequent verbal cues and tactile cues to stay close to the walker with ambulation and transfers.    Stairs             Wheelchair Mobility    Modified Rankin (Stroke Patients Only)       Balance Overall balance assessment: Needs assistance Sitting-balance support: Feet supported Sitting balance-Leahy Scale: Good     Standing balance support: Bilateral upper extremity supported;During functional activity Standing balance-Leahy Scale: Fair Standing balance comment: using RW  Cognition Arousal/Alertness: Awake/alert Behavior During Therapy: WFL for tasks assessed/performed;Agitated Overall Cognitive Status: Within Functional Limits for tasks assessed                                 General Comments:  Patient was agitated this session, and less willing to participate.       Exercises General Exercises - Lower Extremity Long Arc Quad: AAROM;AROM;Strengthening;Right;Left;10 reps;Seated Hip Flexion/Marching: AROM;Strengthening;Right;Left;10 reps Toe Raises: AROM;Strengthening;Both;Seated;10 reps Heel Raises: AROM;Strengthening;Both;10 reps;Seated    General Comments        Pertinent Vitals/Pain Faces Pain Scale: Hurts little more Pain Location: right hip Pain Descriptors / Indicators: Other (Comment)(Patient reported feeling nauseated during therapy session) Pain Intervention(s): Monitored during session;Limited activity within patient's tolerance    Home Living                      Prior Function            PT Goals (current goals can now be found in the care plan section) Acute Rehab PT Goals Patient Stated Goal: return home with family to assist PT Goal Formulation: With patient Time For Goal Achievement: 04/21/18 Potential to Achieve Goals: Good Progress towards PT goals: Progressing toward goals    Frequency    7X/week      PT Plan Current plan remains appropriate    Co-evaluation              AM-PAC PT "6 Clicks" Mobility   Outcome Measure  Help needed turning from your back to your side while in a flat bed without using bedrails?: A Little Help needed moving from lying on your back to sitting on the side of a flat bed without using bedrails?: A Lot Help needed moving to and from a bed to a chair (including a wheelchair)?: A Little Help needed standing up from a chair using your arms (e.g., wheelchair or bedside chair)?: A Little Help needed to walk in hospital room?: A Little Help needed climbing 3-5 steps with a railing? : A Lot 6 Click Score: 16    End of Session Equipment Utilized During Treatment: Gait belt Activity Tolerance: Patient tolerated treatment well;Patient limited by fatigue;Treatment limited secondary to  agitation Patient left: in chair;with call bell/phone within reach;with chair alarm set;with nursing/sitter in room Nurse Communication: Mobility status;Other (comment)(Patient reported feeling nauseated; patient agitated) PT Visit Diagnosis: Unsteadiness on feet (R26.81);Other abnormalities of gait and mobility (R26.89);Muscle weakness (generalized) (M62.81)     Time: 4888-9169 PT Time Calculation (min) (ACUTE ONLY): 40 min  Charges:  $Therapeutic Exercise: 8-22 mins $Therapeutic Activity: 23-37 mins                    Clarene Critchley PT, DPT 9:31 AM, 04/09/18 [phone]

## 2018-04-09 NOTE — Progress Notes (Signed)
Patient discharged home today per MD orders. Patient vital signs WDL. IV removed and site WDL. Discharge Instructions including follow up appointments, medications, and education reviewed with patient. Patient verbalizes understanding. Patient is transported out via wheelchair.  

## 2018-04-10 ENCOUNTER — Inpatient Hospital Stay (HOSPITAL_COMMUNITY)
Admission: EM | Admit: 2018-04-10 | Discharge: 2018-04-15 | DRG: 698 | Disposition: A | Discharge status: Home / Self Care | Payer: Medicare Other | Attending: Pulmonary Disease | Admitting: Pulmonary Disease

## 2018-04-10 ENCOUNTER — Emergency Department (HOSPITAL_COMMUNITY): Payer: Medicare Other

## 2018-04-10 ENCOUNTER — Encounter (HOSPITAL_COMMUNITY): Payer: Self-pay | Admitting: Emergency Medicine

## 2018-04-10 ENCOUNTER — Other Ambulatory Visit: Payer: Self-pay

## 2018-04-10 DIAGNOSIS — T83511A Infection and inflammatory reaction due to indwelling urethral catheter, initial encounter: Principal | ICD-10-CM | POA: Diagnosis present

## 2018-04-10 DIAGNOSIS — K219 Gastro-esophageal reflux disease without esophagitis: Secondary | ICD-10-CM | POA: Diagnosis present

## 2018-04-10 DIAGNOSIS — S72001S Fracture of unspecified part of neck of right femur, sequela: Secondary | ICD-10-CM

## 2018-04-10 DIAGNOSIS — F419 Anxiety disorder, unspecified: Secondary | ICD-10-CM

## 2018-04-10 DIAGNOSIS — R4182 Altered mental status, unspecified: Secondary | ICD-10-CM | POA: Diagnosis present

## 2018-04-10 DIAGNOSIS — Z881 Allergy status to other antibiotic agents status: Secondary | ICD-10-CM

## 2018-04-10 DIAGNOSIS — Y846 Urinary catheterization as the cause of abnormal reaction of the patient, or of later complication, without mention of misadventure at the time of the procedure: Secondary | ICD-10-CM | POA: Diagnosis present

## 2018-04-10 DIAGNOSIS — J449 Chronic obstructive pulmonary disease, unspecified: Secondary | ICD-10-CM | POA: Diagnosis present

## 2018-04-10 DIAGNOSIS — N390 Urinary tract infection, site not specified: Secondary | ICD-10-CM | POA: Diagnosis not present

## 2018-04-10 DIAGNOSIS — E785 Hyperlipidemia, unspecified: Secondary | ICD-10-CM | POA: Diagnosis present

## 2018-04-10 DIAGNOSIS — J440 Chronic obstructive pulmonary disease with acute lower respiratory infection: Secondary | ICD-10-CM | POA: Diagnosis present

## 2018-04-10 DIAGNOSIS — Y95 Nosocomial condition: Secondary | ICD-10-CM | POA: Diagnosis present

## 2018-04-10 DIAGNOSIS — Z79891 Long term (current) use of opiate analgesic: Secondary | ICD-10-CM

## 2018-04-10 DIAGNOSIS — I951 Orthostatic hypotension: Secondary | ICD-10-CM | POA: Diagnosis present

## 2018-04-10 DIAGNOSIS — R410 Disorientation, unspecified: Secondary | ICD-10-CM

## 2018-04-10 DIAGNOSIS — J189 Pneumonia, unspecified organism: Secondary | ICD-10-CM

## 2018-04-10 DIAGNOSIS — Z885 Allergy status to narcotic agent status: Secondary | ICD-10-CM

## 2018-04-10 DIAGNOSIS — R531 Weakness: Secondary | ICD-10-CM | POA: Diagnosis not present

## 2018-04-10 DIAGNOSIS — E860 Dehydration: Secondary | ICD-10-CM

## 2018-04-10 DIAGNOSIS — E871 Hypo-osmolality and hyponatremia: Secondary | ICD-10-CM | POA: Diagnosis not present

## 2018-04-10 DIAGNOSIS — Z79899 Other long term (current) drug therapy: Secondary | ICD-10-CM

## 2018-04-10 DIAGNOSIS — G9341 Metabolic encephalopathy: Secondary | ICD-10-CM | POA: Diagnosis present

## 2018-04-10 DIAGNOSIS — Z882 Allergy status to sulfonamides status: Secondary | ICD-10-CM

## 2018-04-10 DIAGNOSIS — J441 Chronic obstructive pulmonary disease with (acute) exacerbation: Secondary | ICD-10-CM | POA: Diagnosis present

## 2018-04-10 DIAGNOSIS — M810 Age-related osteoporosis without current pathological fracture: Secondary | ICD-10-CM | POA: Diagnosis present

## 2018-04-10 DIAGNOSIS — Z888 Allergy status to other drugs, medicaments and biological substances status: Secondary | ICD-10-CM

## 2018-04-10 DIAGNOSIS — B965 Pseudomonas (aeruginosa) (mallei) (pseudomallei) as the cause of diseases classified elsewhere: Secondary | ICD-10-CM | POA: Diagnosis present

## 2018-04-10 DIAGNOSIS — K589 Irritable bowel syndrome without diarrhea: Secondary | ICD-10-CM | POA: Diagnosis present

## 2018-04-10 DIAGNOSIS — R918 Other nonspecific abnormal finding of lung field: Secondary | ICD-10-CM | POA: Diagnosis not present

## 2018-04-10 DIAGNOSIS — R339 Retention of urine, unspecified: Secondary | ICD-10-CM | POA: Diagnosis present

## 2018-04-10 DIAGNOSIS — S72001A Fracture of unspecified part of neck of right femur, initial encounter for closed fracture: Secondary | ICD-10-CM | POA: Diagnosis present

## 2018-04-10 DIAGNOSIS — S72011D Unspecified intracapsular fracture of right femur, subsequent encounter for closed fracture with routine healing: Secondary | ICD-10-CM

## 2018-04-10 LAB — CBC
HCT: 31.1 % — ABNORMAL LOW (ref 36.0–46.0)
Hemoglobin: 9.6 g/dL — ABNORMAL LOW (ref 12.0–15.0)
MCH: 26.2 pg (ref 26.0–34.0)
MCHC: 30.9 g/dL (ref 30.0–36.0)
MCV: 85 fL (ref 80.0–100.0)
Platelets: 461 10*3/uL — ABNORMAL HIGH (ref 150–400)
RBC: 3.66 MIL/uL — ABNORMAL LOW (ref 3.87–5.11)
RDW: 14.1 % (ref 11.5–15.5)
WBC: 13.1 10*3/uL — ABNORMAL HIGH (ref 4.0–10.5)
nRBC: 0 % (ref 0.0–0.2)

## 2018-04-10 LAB — URINALYSIS, ROUTINE W REFLEX MICROSCOPIC
Bilirubin Urine: NEGATIVE
Glucose, UA: NEGATIVE mg/dL
Ketones, ur: NEGATIVE mg/dL
Nitrite: POSITIVE — AB
Protein, ur: NEGATIVE mg/dL
Specific Gravity, Urine: 1.016 (ref 1.005–1.030)
WBC, UA: >50 WBC/hpf — ABNORMAL HIGH (ref 0–5)
pH: 6 (ref 5.0–8.0)

## 2018-04-10 LAB — DIFFERENTIAL
Basophils Absolute: 0.1 10*3/uL (ref 0.0–0.1)
Basophils Relative: 1 %
Eosinophils Absolute: 0.3 10*3/uL (ref 0.0–0.5)
Eosinophils Relative: 3 %
Lymphocytes Relative: 9 %
Lymphs Abs: 1.2 10*3/uL (ref 0.7–4.0)
Monocytes Absolute: 1.6 10*3/uL — ABNORMAL HIGH (ref 0.1–1.0)
Monocytes Relative: 13 %
Neutro Abs: 9.6 10*3/uL — ABNORMAL HIGH (ref 1.7–7.7)
Neutrophils Relative %: 74 %

## 2018-04-10 LAB — BASIC METABOLIC PANEL
Anion gap: 10 (ref 5–15)
BUN: 17 mg/dL (ref 8–23)
CO2: 24 mmol/L (ref 22–32)
Calcium: 8.5 mg/dL — ABNORMAL LOW (ref 8.9–10.3)
Chloride: 96 mmol/L — ABNORMAL LOW (ref 98–111)
Creatinine, Ser: 0.69 mg/dL (ref 0.44–1.00)
GFR calc Af Amer: >60 mL/min (ref >60)
GFR calc non Af Amer: >60 mL/min (ref >60)
Glucose, Bld: 103 mg/dL — ABNORMAL HIGH (ref 70–99)
Potassium: 4.4 mmol/L (ref 3.5–5.1)
Sodium: 130 mmol/L — ABNORMAL LOW (ref 135–145)

## 2018-04-10 LAB — RAPID URINE DRUG SCREEN, HOSP PERFORMED
Amphetamines: NOT DETECTED
Barbiturates: NOT DETECTED
Benzodiazepines: POSITIVE — AB
Cocaine: NOT DETECTED
Opiates: NOT DETECTED
Tetrahydrocannabinol: NOT DETECTED

## 2018-04-10 LAB — TROPONIN I: Troponin I: <0.03 ng/mL (ref <0.03)

## 2018-04-10 LAB — BRAIN NATRIURETIC PEPTIDE: B Natriuretic Peptide: 73 pg/mL (ref 0.0–100.0)

## 2018-04-10 LAB — LACTIC ACID, PLASMA
Lactic Acid, Venous: 1 mmol/L (ref 0.5–1.9)
Lactic Acid, Venous: 1.1 mmol/L (ref 0.5–1.9)

## 2018-04-10 MED ORDER — ENOXAPARIN SODIUM 40 MG/0.4ML ~~LOC~~ SOLN
40.0000 mg | SUBCUTANEOUS | Status: DC
  Administered 2018-04-10 – 2018-04-14 (×5): 40 mg via SUBCUTANEOUS
  Filled 2018-04-10 (×5): qty 0.4

## 2018-04-10 MED ORDER — SODIUM CHLORIDE 0.9 % IV SOLN
1.0000 g | INTRAVENOUS | Status: DC
  Administered 2018-04-10 – 2018-04-12 (×3): 1 g via INTRAVENOUS
  Filled 2018-04-10 (×3): qty 10

## 2018-04-10 MED ORDER — UMECLIDINIUM-VILANTEROL 62.5-25 MCG/INH IN AEPB
1.0000 | INHALATION_SPRAY | Freq: Every day | RESPIRATORY_TRACT | Status: DC
  Administered 2018-04-12 – 2018-04-15 (×4): 1 via RESPIRATORY_TRACT
  Filled 2018-04-10: qty 14

## 2018-04-10 MED ORDER — PANTOPRAZOLE SODIUM 40 MG PO TBEC: 40.0000 mg | DELAYED_RELEASE_TABLET | Freq: Every day | ORAL | Status: DC | PRN

## 2018-04-10 MED ORDER — SODIUM CHLORIDE 0.9 % IV SOLN
INTRAVENOUS | Status: AC
  Administered 2018-04-10: 16:00:00 via INTRAVENOUS

## 2018-04-10 MED ORDER — ACETAMINOPHEN 325 MG PO TABS
650.0000 mg | ORAL_TABLET | Freq: Four times a day (QID) | ORAL | Status: DC | PRN
  Administered 2018-04-15: 650 mg via ORAL
  Filled 2018-04-10: qty 2

## 2018-04-10 MED ORDER — ALPRAZOLAM 0.5 MG PO TABS
0.5000 mg | ORAL_TABLET | Freq: Three times a day (TID) | ORAL | Status: DC | PRN
  Administered 2018-04-12 – 2018-04-14 (×4): 0.5 mg via ORAL
  Filled 2018-04-10 (×4): qty 1

## 2018-04-10 MED ORDER — VANCOMYCIN HCL 1.25 G IV SOLR
1250.0000 mg | Freq: Once | INTRAVENOUS | Status: DC
  Filled 2018-04-10: qty 1250

## 2018-04-10 MED ORDER — ALBUTEROL SULFATE (2.5 MG/3ML) 0.083% IN NEBU: 2.5000 mg | INHALATION_SOLUTION | Freq: Four times a day (QID) | RESPIRATORY_TRACT | Status: DC | PRN

## 2018-04-10 MED ORDER — SODIUM CHLORIDE 0.9 % IV BOLUS
250.0000 mL | Freq: Once | INTRAVENOUS | Status: AC
  Administered 2018-04-10: 250 mL via INTRAVENOUS

## 2018-04-10 MED ORDER — TRAMADOL HCL 50 MG PO TABS
100.0000 mg | ORAL_TABLET | Freq: Four times a day (QID) | ORAL | Status: DC | PRN
  Administered 2018-04-10 – 2018-04-14 (×6): 100 mg via ORAL
  Filled 2018-04-10 (×7): qty 2

## 2018-04-10 MED ORDER — SODIUM CHLORIDE 0.9 % IV SOLN
1.0000 g | Freq: Three times a day (TID) | INTRAVENOUS | Status: DC
  Administered 2018-04-10: 1 g via INTRAVENOUS
  Filled 2018-04-10: qty 1

## 2018-04-10 MED ORDER — DICLOFENAC SODIUM 1 % TD GEL
4.0000 g | Freq: Four times a day (QID) | TRANSDERMAL | Status: DC
  Administered 2018-04-11 – 2018-04-15 (×16): 4 g via TOPICAL
  Filled 2018-04-10 (×2): qty 100

## 2018-04-10 MED ORDER — TRAMADOL HCL 50 MG PO TABS: 100.0000 mg | ORAL_TABLET | ORAL | Status: DC | PRN

## 2018-04-10 MED ORDER — VANCOMYCIN HCL 10 G IV SOLR
1250.0000 mg | Freq: Once | INTRAVENOUS | Status: DC
  Filled 2018-04-10: qty 1250

## 2018-04-10 MED ORDER — ACETAMINOPHEN 650 MG RE SUPP: 650.0000 mg | Freq: Four times a day (QID) | RECTAL | Status: DC | PRN

## 2018-04-10 MED ORDER — VANCOMYCIN HCL IN DEXTROSE 750-5 MG/150ML-% IV SOLN: 750.0000 mg | INTRAVENOUS | Status: DC

## 2018-04-10 NOTE — ED Notes (Signed)
Pharmacy contacted for Vancomycin.

## 2018-04-10 NOTE — ED Notes (Addendum)
See triage notes. Pt states daughter and son told her she was sleeping too much. Pt is upset she is here and did not want to come. Pt denies feeling any sudden weakness

## 2018-04-10 NOTE — ED Notes (Signed)
Changed out the leg bag to obtain a new urine sample.

## 2018-04-10 NOTE — H&P (Signed)
History and Physical    [name] HCW:[id number] DOB: [birth year] DOA: 04/10/2018  PCP: [name], MD  Patient coming from: Home  I have personally briefly reviewed patient's old medical records in [place]  Chief Complaint: Altered mental status  HPI: [name] is a 84 y.o. female with medical history significant for COPD, GERD, osteoporosis, and anxiety who was recently admitted from 04/05/2018-04/09/2018 with a right hip fracture.  She underwent cannulated hip pinning on 04/06/2018.  It was recommended she be discharged to a skilled nursing facility however she refused and was discharged to home on 04/09/2018 with a Foley catheter in place due to urinary retention.  She returns to the ED today, 04/10/2018, with family reported increased confusion, generalized weakness, lethargy, and poor oral intake.  She denies any chest pain, dyspnea, abdominal pain, dysuria, nausea, vomiting, diarrhea, or peripheral edema.  She has not been out of bed due to her right hip pain.  She denies any loss of consciousness.  ED Course:  Initial vitals in the ED showed BP 107/69, pulse 85, RR 19, temp 98.0 Fahrenheit, SPO2 94% on room air.  Orthostatic vitals were positive when going from sitting to standing position.  Labs are notable for WBC 13.1, hemoglobin 9.6, platelets 461, sodium 130, potassium 4.4, bicarb 24, BUN 17, creatinine 8.69, serum glucose 103, BNP 73, troponin I <0.03, lactic acid 1.0.  Urinalysis showed positive nitrites, large leukocytes, rare bacteria, 6-10 RBCs on microscopy.  CT head without contrast showed stable atrophy with supratentorial small vessel disease, no evidence of acute infarct, mass, or hemorrhage.  2 view chest x-ray showed patchy infiltrates in the perihilar regions and lower lobes with volume loss.  Patient was given 250 mL normal saline and ordered to receive IV vancomycin and cefepime.  The hospitalist service was consulted to admit for further evaluation and  management.  Review of Systems: As per HPI otherwise 10 point review of systems negative.    Past Medical History:  Diagnosis Date   Anxiety    COPD (chronic obstructive pulmonary disease) (HCC)    GERD (gastroesophageal reflux disease)    Hyperlipidemia    IBS (irritable bowel syndrome)    Osteoporosis     Past Surgical History:  Procedure Laterality Date   COLONOSCOPY  10/2009   sigmoid diverticula, multiple tubulovillous adneomas, needs surveillance Oct 2013   ESOPHAGOGASTRODUODENOSCOPY  04/16/2011   Dr. Gala Romney: Noncritical Schatzkis ring( not manipulated because no dysphagia). Normal esophagus otherwise  large hiatal hernia. Gastric polyp-status post biopsy. Gastric erosions-staus post biopsy. Abnormal bulb-status post biopsy. benign small bowel, stomach biopsy with ulcerated gastric antral mucosa with foveolar hyperplasia and surface erosion, polyp with inflamed gastric antral mucosa    ESOPHAGOGASTRODUODENOSCOPY N/A 04/29/2014   Dr. Gala Romney: Noncritical Schatzki's ring large hiatal hernia. Retained gastric contents.GES with slight delay   HIP PINNING,CANNULATED Right 04/06/2018   Procedure: CANNULATED HIP PINNING;  Surgeon: Carole Civil, MD;  Location: [facility];  Service: Orthopedics;  Laterality: Right;     reports that she has never smoked. Her smokeless tobacco use includes snuff. She reports that she does not drink alcohol or use drugs.  Allergies  Allergen Reactions   Codeine Shortness Of Breath and Rash   Codeine Anaphylaxis   Levofloxacin Anaphylaxis    Patient was admitted to hospital with lung problems due to this medication   Sulfonamide Derivatives Hives and Shortness Of Breath   Sulfa Antibiotics Hives    Family History  Problem Relation Age  of Onset   Stroke Mother    Diabetes Mother    Heart disease Father    Colon cancer Neg Hx      Prior to Admission medications   Medication Sig Start Date End Date Taking? Authorizing Provider    albuterol (PROVENTIL) (2.5 MG/3ML) 0.083% nebulizer solution Take 2.5 mg by nebulization every 6 (six) hours as needed for wheezing or shortness of breath.    [provider]  Albuterol Sulfate (PROAIR HFA IN) Inhale 2 puffs into the lungs every 4 (four) hours as needed (4-6 hours).    [provider]  ALPRAZolam Duanne Moron) 1 MG tablet Take 0.5 tablets (0.5 mg total) by mouth 3 (three) times daily as needed for anxiety. 04/09/18   [name], MD  cetirizine (ZYRTEC) 10 MG tablet Take 10 mg by mouth daily.    [provider]  docusate sodium (COLACE) 100 MG capsule Take 1 capsule (100 mg total) by mouth 2 (two) times daily. 04/09/18   [name], MD  furosemide (LASIX) 40 MG tablet Take 40 mg by mouth daily.    [provider]  ipratropium (ATROVENT HFA) 17 MCG/ACT inhaler Inhale 2 puffs into the lungs every 4 (four) hours as needed for wheezing. Patient not taking: Reported on 04/03/2018 02/17/18   Milton Ferguson, MD  pantoprazole (PROTONIX) 40 MG tablet TAKE (1) TABLET BY MOUTH TWICE A DAY WITH MEALS (BREAKFAST AND SUPPER) Patient taking differently: Take 40 mg by mouth daily as needed.  10/04/17   Annitta Needs, NP  potassium chloride (K-DUR) 10 MEQ tablet Take 10 mEq by mouth daily.    [provider]  traMADol (ULTRAM) 50 MG tablet Take 100 mg by mouth every 4 (four) hours as needed.    [provider]  umeclidinium-vilanterol (ANORO ELLIPTA) 62.5-25 MCG/INH AEPB Inhale 1 puff into the lungs daily.    [provider]  VOLTAREN 1 % GEL Apply 1 g topically daily as needed. pain 07/01/14   [provider]  zolpidem (AMBIEN) 10 MG tablet Take 5 mg by mouth at bedtime as needed for sleep.     [provider]    Physical Exam: Vitals:   04/10/18 1600 04/10/18 1615 04/10/18 1630 04/10/18 1700  BP: (!) 114/100  132/75 119/82  Pulse: 82 81 81 83  Resp: 15 12 12 12   Temp:      TempSrc:      SpO2: 92% 95% 94% 97%   Weight:      Height:        Constitutional: Thin elderly woman resting supine in bed, NAD, calm, comfortable Eyes: PERRL, lids and conjunctivae normal ENMT: Mucous membranes are dry. Posterior pharynx clear of any exudate or lesions.Normal dentition.  Neck: normal, supple, no masses. Respiratory: clear to auscultation anteriorly, no wheezing, no crackles. Normal respiratory effort. No accessory muscle use.  Cardiovascular: Regular rate and rhythm, no murmurs / rubs / gallops. No extremity edema.  Abdomen: no tenderness, no masses palpated. No hepatosplenomegaly. Bowel sounds positive.  GU: Foley catheter in place Musculoskeletal: no clubbing / cyanosis. No joint deformity upper and lower extremities.  Right foot everted, range of motion RLE limited due to right hip pain/weakness. Skin: no rashes, lesions, ulcers. No induration Neurologic: CN 2-12 grossly intact. Sensation intact, Strength 5/5 in all 4 extremities except RLE due to recent right hip fracture.Marland Kitchen  Psychiatric: Alert and oriented to person, place, and year.      Labs on Admission: I have personally reviewed following  labs and imaging studies  CBC: Recent Labs  Lab 04/05/18 1439 04/06/18 0441 04/07/18 0419 04/08/18 0604 04/09/18 0620 04/10/18 1400  WBC 13.2* 13.6* 13.4* 11.8* 11.7* 13.1*  NEUTROABS 10.8*  --   --   --   --  9.6*  HGB 11.5* 9.7* 8.6* 8.8* 9.1* 9.6*  HCT 36.6 31.0* 28.5* 28.9* 29.3* 31.1*  MCV 85.1 85.4 87.4 86.0 85.7 85.0  PLT 427* 382 349 377 404* 681*   Basic Metabolic Panel: Recent Labs  Lab 04/05/18 1439 04/06/18 0441 04/07/18 0419 04/08/18 0604 04/10/18 1400  NA 133* 135 136 131* 130*  K 4.3 4.1 3.8 4.2 4.4  CL 91* 94* 98 95* 96*  CO2 30 29 28 28 24   GLUCOSE 117* 109* 106* 107* 103*  BUN 16 18 17 12 17   CREATININE 0.83 0.85 0.81 0.63 0.69  CALCIUM 8.8* 8.5* 8.0* 8.2* 8.5*   GFR: Estimated Creatinine Clearance: 45.6 mL/min (by C-G formula based on SCr of 0.69 mg/dL). Liver  Function Tests: No results for input(s): AST, ALT, ALKPHOS, BILITOT, PROT, ALBUMIN in the last 168 hours. No results for input(s): LIPASE, AMYLASE in the last 168 hours. No results for input(s): AMMONIA in the last 168 hours. Coagulation Profile: No results for input(s): INR, PROTIME in the last 168 hours. Cardiac Enzymes: Recent Labs  Lab 04/10/18 1455  TROPONINI <0.03   BNP (last 3 results) No results for input(s): PROBNP in the last 8760 hours. HbA1C: No results for input(s): HGBA1C in the last 72 hours. CBG: No results for input(s): GLUCAP in the last 168 hours. Lipid Profile: No results for input(s): CHOL, HDL, LDLCALC, TRIG, CHOLHDL, LDLDIRECT in the last 72 hours. Thyroid Function Tests: No results for input(s): TSH, T4TOTAL, FREET4, T3FREE, THYROIDAB in the last 72 hours. Anemia Panel: No results for input(s): VITAMINB12, FOLATE, FERRITIN, TIBC, IRON, RETICCTPCT in the last 72 hours. Urine analysis:    Component Value Date/Time   COLORURINE YELLOW 04/10/2018 1444   APPEARANCEUR HAZY (A) 04/10/2018 1444   LABSPEC 1.016 04/10/2018 1444   PHURINE 6.0 04/10/2018 1444   GLUCOSEU NEGATIVE 04/10/2018 1444   HGBUR MODERATE (A) 04/10/2018 1444   BILIRUBINUR NEGATIVE 04/10/2018 1444   KETONESUR NEGATIVE 04/10/2018 1444   PROTEINUR NEGATIVE 04/10/2018 1444   UROBILINOGEN 0.2 03/01/2012 2011   NITRITE POSITIVE (A) 04/10/2018 1444   LEUKOCYTESUR LARGE (A) 04/10/2018 1444    Radiological Exams on Admission: Dg Chest 2 View  Result Date: 04/10/2018 CLINICAL DATA:  Dehydration.  Confusion. EXAM: CHEST - 2 VIEW COMPARISON:  04/05/2018 FINDINGS: Patient is rotated towards the right. Hiatal hernia is again seen. There are new bilateral patchy pulmonary infiltrates with volume loss in the lower lobes. Findings are worrisome for bilateral lower lobe pneumonia. Heart failure could have a similar appearance. IMPRESSION: New patchy infiltrates in the perihilar regions and lower lobes with  volume loss. Pneumonia most likely. Some possibility this could be a manifestation of congestive heart failure. Electronically Signed   By: Nelson Chimes M.D.   On: 04/10/2018 15:27   Ct Head Wo Contrast  Result Date: 04/10/2018 CLINICAL DATA:  Confusion/altered mental status EXAM: CT HEAD WITHOUT CONTRAST TECHNIQUE: Contiguous axial images were obtained from the base of the skull through the vertex without intravenous contrast. COMPARISON:  April 03, 2018 FINDINGS: Brain: There is stable diffuse atrophy. There is no intracranial mass, hemorrhage, extra-axial fluid collection, or midline shift. There is patchy small vessel disease in the centra semiovale bilaterally, stable. There is small vessel disease  in each external capsule, more on the left than on the right, stable. No acute appearing infarct is evident. Vascular: There is no hyperdense vessel. There is calcification in each distal vertebral artery and in the carotid siphon regions bilaterally. Skull: The bony calvarium appears intact. Sinuses/Orbits: There is mucosal thickening in several ethmoid air cells. There is also mild mucosal thickening in the inferior right maxillary antrum. Orbits appear symmetric bilaterally. Other: Mastoid air cells are clear. There is debris in the right external auditory canal. IMPRESSION: Stable atrophy with supratentorial small vessel disease. No acute infarct evident. No mass or hemorrhage. Foci of arterial vascular calcification noted. Areas of paranasal sinus disease noted. Probable cerumen in the right external auditory canal. Electronically Signed   By: Lowella Grip III M.D.   On: 04/10/2018 15:42    EKG: Independently reviewed. Sinus tachycardia with PAC and nonspecific ST changes inferior leads.  Not significantly changed from prior.  Assessment/Plan Principal Problem:   Altered mental status Active Problems:   Closed right hip fracture (HCC)   GERD (gastroesophageal reflux disease)   COPD (chronic  obstructive pulmonary disease) (HCC)   Urinary retention   Anxiety   Hyponatremia   UTI (urinary tract infection) due to urinary indwelling catheter (HCC)  [name] is a 84 y.o. female with medical history significant for COPD, GERD, osteoporosis, and anxiety who was recently admitted from 04/05/2018-04/09/2018 with a right hip fracture who returns with AMS suspected secondary to UTI.   Altered mental status secondary to UTI: Suspect AMS and poor p.o. intake multifactorial due to suspected catheter associated UTI and deconditioning from immobility after right hip fracture and hospitalization.  Symptomology not consistent with pneumonia, suspect chest x-ray findings may be postop atelectasis. -DC Vanco and cefepime, start IV ceftriaxone -Follow-up urine culture -Continue gentle IV fluids overnight -Remove Foley, bladder scan and in and out cath as needed  Hyponatremia: Mild on admission.  Likely from volume depletion which is supported by orthostatic hypotension on admission. -Continue gentle IV fluids overnight and recheck labs in a.m.  Closed right hip fracture s/p cannulated hip pinning 04/06/2018: Declined transfer to skilled nursing facility on recent admission. -PT/OT eval -Voltaren gel, Tylenol, and tramadol PRN for pain with hold parameters -Incentive spirometry  COPD: Currently stable without acute exacerbation. -Continue Anoro Ellipta and as needed albuterol  GERD: -Continue Protonix  Anxiety: -Continue home Xanax as needed with hold parameters   DVT prophylaxis: Lovenox Code Status: Full code, confirmed with patient and daughter Family Communication: Discussed with daughter by phone Disposition Plan: Pending clinical progress Consults called: None Admission status: Observation   Zada Finders MD [facility] Pager [phone]  If 7PM-7AM, please contact night-coverage [email or web address]  04/10/2018, 5:52 PM

## 2018-04-10 NOTE — ED Notes (Signed)
Patient transported to CT 

## 2018-04-10 NOTE — ED Provider Notes (Signed)
[facility] EMERGENCY DEPARTMENT Provider Note   CSN: [id number] Arrival date & time: 04/10/18  1346    History   Chief Complaint Chief Complaint  Patient presents with   Weakness    HPI [name] is a 84 y.o. female.     The history is provided by the patient, a relative and a caregiver (Pt's PMD). History limited by: confusion.  Weakness   Pt was seen at 1420. Per pt's family, pt's PMD and pt report: Pt's family called PMD today to c/o new confusion, increased sleeping, generalized weakness, decreased PO intake. Pt's PMD states pt's baseline is A&O and she was discharged from the hospital yesterday for s/p ORIF right hip fracture. Pt herself states she does not know why she is here. No reported fevers, cough, vomiting/diarrhea, focal motor weakness. Pt herself denies CP, SOB, abd pain.     Past Medical History:  Diagnosis Date   Anxiety    COPD (chronic obstructive pulmonary disease) ([place])    GERD (gastroesophageal reflux disease)    Hyperlipidemia    IBS (irritable bowel syndrome)    Osteoporosis     Patient Active Problem List   Diagnosis Date Noted   Constipation 04/09/2018   Urinary retention 04/09/2018   Closed right hip fracture ([place]) 04/05/2018   GERD (gastroesophageal reflux disease) 04/05/2018   COPD (chronic obstructive pulmonary disease) ([place]) 04/05/2018   Hiatal hernia    Dyspepsia 04/04/2014   Hematochezia 04/30/2011   RUQ pain 03/25/2011   Adenomatous polyps 03/25/2011   ABDOMINAL PAIN, LEFT LOWER QUADRANT 10/16/2009   HYPERLIPIDEMIA 10/09/2009   BRONCHITIS 10/09/2009   Osteoporosis 10/09/2009    Past Surgical History:  Procedure Laterality Date   COLONOSCOPY  10/2009   sigmoid diverticula, multiple tubulovillous adneomas, needs surveillance Oct 2013   ESOPHAGOGASTRODUODENOSCOPY  04/16/2011   Dr. Gala Romney: Noncritical Schatzkis ring( not manipulated because no dysphagia). Normal esophagus otherwise  large hiatal hernia.  Gastric polyp-status post biopsy. Gastric erosions-staus post biopsy. Abnormal bulb-status post biopsy. benign small bowel, stomach biopsy with ulcerated gastric antral mucosa with foveolar hyperplasia and surface erosion, polyp with inflamed gastric antral mucosa    ESOPHAGOGASTRODUODENOSCOPY N/A 04/29/2014   Dr. Gala Romney: Noncritical Schatzki's ring large hiatal hernia. Retained gastric contents.GES with slight delay   HIP PINNING,CANNULATED Right 04/06/2018   Procedure: CANNULATED HIP PINNING;  Surgeon: Carole Civil, MD;  Location: [facility];  Service: Orthopedics;  Laterality: Right;     OB History    Gravida  3   Para  3   Term  3   Preterm  0   AB  0   Living        SAB  0   TAB  0   Ectopic  0   Multiple      Live Births               Home Medications    Prior to Admission medications   Medication Sig Start Date End Date Taking? Authorizing Provider  albuterol (PROVENTIL) (2.5 MG/3ML) 0.083% nebulizer solution Take 2.5 mg by nebulization every 6 (six) hours as needed for wheezing or shortness of breath.    [provider]  Albuterol Sulfate (PROAIR HFA IN) Inhale 2 puffs into the lungs every 4 (four) hours as needed (4-6 hours).    [provider]  ALPRAZolam Duanne Moron) 1 MG tablet Take 0.5 tablets (0.5 mg total) by mouth 3 (three) times daily as needed for anxiety. 04/09/18   [name], MD  cetirizine (ZYRTEC) 10 MG tablet Take 10 mg by mouth daily.    [provider]  docusate sodium (COLACE) 100 MG capsule Take 1 capsule (100 mg total) by mouth 2 (two) times daily. 04/09/18   [name], MD  furosemide (LASIX) 40 MG tablet Take 40 mg by mouth daily.    [provider]  ipratropium (ATROVENT HFA) 17 MCG/ACT inhaler Inhale 2 puffs into the lungs every 4 (four) hours as needed for wheezing. Patient not taking: Reported on 04/03/2018 02/17/18   Milton Ferguson, MD  pantoprazole (PROTONIX) 40 MG tablet TAKE (1) TABLET BY MOUTH  TWICE A DAY WITH MEALS (BREAKFAST AND SUPPER) Patient taking differently: Take 40 mg by mouth daily as needed.  10/04/17   Annitta Needs, NP  potassium chloride (K-DUR) 10 MEQ tablet Take 10 mEq by mouth daily.    [provider]  traMADol (ULTRAM) 50 MG tablet Take 100 mg by mouth every 4 (four) hours as needed.    [provider]  umeclidinium-vilanterol (ANORO ELLIPTA) 62.5-25 MCG/INH AEPB Inhale 1 puff into the lungs daily.    [provider]  VOLTAREN 1 % GEL Apply 1 g topically daily as needed. pain 07/01/14   [provider]  zolpidem (AMBIEN) 10 MG tablet Take 5 mg by mouth at bedtime as needed for sleep.     [provider]    Family History Family History  Problem Relation Age of Onset   Stroke Mother    Diabetes Mother    Heart disease Father    Colon cancer Neg Hx     Social History Social History   Tobacco Use   Smoking status: Never Smoker   Smokeless tobacco: Current User    Types: Snuff  Substance Use Topics   Alcohol use: No   Drug use: No     Allergies   Codeine; Codeine; Levofloxacin; Sulfonamide derivatives; and Sulfa antibiotics   Review of Systems Review of Systems  Unable to perform ROS: Mental status change  Neurological: Positive for weakness.     Physical Exam Updated Vital Signs BP 103/78    Pulse 83    Temp 98 F (36.7 C) (Oral)    Resp 19    Ht 5\' 3"  (1.6 m)    Wt 52.9 kg    SpO2 94%    BMI 20.67 kg/m    Patient Vitals for the past 24 hrs:  BP Temp Temp src Pulse Resp SpO2 Height Weight  04/10/18 1500 103/78 -- -- 83 19 94 % -- --  04/10/18 1430 116/72 -- -- 84 12 93 % -- --  04/10/18 1400 107/69 -- -- 85 (!) 9 94 % -- --  04/10/18 1352 91/67 98 F (36.7 C) Oral 86 12 94 % -- --  04/10/18 1348 -- -- -- -- -- -- 5\' 3"  (1.6 m) 52.9 kg    15:38:49 Orthostatic Vital Signs BO  Orthostatic Lying   BP- Lying: 117/71  Pulse- Lying: 84      Orthostatic Sitting  BP- Sitting: 124/79   Pulse- Sitting: 84      Orthostatic Standing at 0 minutes  BP- Standing at 0 minutes: 100/73  Pulse- Standing at 0 minutes: 92     Physical Exam 1425: Physical examination:  Nursing notes reviewed; Vital signs and O2 SAT reviewed;  Constitutional: Well developed, Well nourished, In no acute distress; Head:  Normocephalic, atraumatic; Eyes: EOMI, PERRL, No scleral icterus; ENMT: Mouth and pharynx normal, Mucous membranes dry;  Neck: Supple, Full range of motion, No lymphadenopathy; Cardiovascular: Regular rate and rhythm, No gallop; Respiratory: Breath sounds clear & equal bilaterally, No wheezes.  Speaking full sentences with ease, Normal respiratory effort/excursion; Chest: Nontender, Movement normal; Abdomen: Soft, Nontender, Nondistended, Normal bowel sounds; Genitourinary: No CVA tenderness; Extremities: Peripheral pulses normal, +DSD right hip. No tenderness, No edema, No calf edema or asymmetry.; Neuro: Awake, alert, confused re: time, events. No facial droop. Speech clear. Grips equal. Pt s/p right hip surgery. Moves all extremities spontaneously and to command without apparent gross focal motor deficits.; Skin: Color normal, Warm, Dry.    ED Treatments / Results  Labs (all labs ordered are listed, but only abnormal results are displayed)   EKG EKG Interpretation  Date/Time:  Monday April 10 2018 13:54:18 EDT Ventricular Rate:  85 PR Interval:    QRS Duration: 102 QT Interval:  382 QTC Calculation: 455 R Axis:   58 Text Interpretation:  Sinus rhythm Atrial premature complex Minimal ST elevation, inferior leads Baseline wander When compared with ECG of 04/05/2018 No significant change was found Confirmed by Francine Graven [phone]) on 04/10/2018 3:03:19 PM   Radiology   Procedures Procedures (including critical care time)  Medications Ordered in ED Medications  0.9 %  sodium chloride infusion ( Intravenous New Bag/Given 04/10/18 1537)  sodium chloride 0.9 % bolus 250 mL  (250 mLs Intravenous New Bag/Given 04/10/18 1535)     Initial Impression / Assessment and Plan / ED Course  I have reviewed the triage vital signs and the nursing notes.  Pertinent labs & imaging results that were available during my care of the patient were reviewed by me and considered in my medical decision making (see chart for details).     MDM Reviewed: previous chart, nursing note and vitals Reviewed previous: labs and ECG Interpretation: labs, ECG, x-ray and CT scan Total time providing critical care: 30-74 minutes. This excludes time spent performing separately reportable procedures and services. Consults: admitting MD    CRITICAL CARE Performed by: Francine Graven Total critical care time: 35 minutes Critical care time was exclusive of separately billable procedures and treating other patients. Critical care was necessary to treat or prevent imminent or life-threatening deterioration. Critical care was time spent personally by me on the following activities: development of treatment plan with patient and/or surrogate as well as nursing, discussions with consultants, evaluation of patient's response to treatment, examination of patient, obtaining history from patient or surrogate, ordering and performing treatments and interventions, ordering and review of laboratory studies, ordering and review of radiographic studies, pulse oximetry and re-evaluation of patient's condition.   Results for orders placed or performed during the hospital encounter of 84/69/62  Basic metabolic panel  Result Value Ref Range   Sodium 130 (L) 135 - 145 mmol/L   Potassium 4.4 3.5 - 5.1 mmol/L   Chloride 96 (L) 98 - 111 mmol/L   CO2 24 22 - 32 mmol/L   Glucose, Bld 103 (H) 70 - 99 mg/dL   BUN 17 8 - 23 mg/dL   Creatinine, Ser 0.69 0.44 - 1.00 mg/dL   Calcium 8.5 (L) 8.9 - 10.3 mg/dL   GFR calc non Af Amer >60 >60 mL/min   GFR calc Af Amer >60 >60 mL/min   Anion gap 10 5 - 15  CBC  Result  Value Ref Range   WBC 13.1 (H) 4.0 - 10.5 K/uL   RBC 3.66 (L) 3.87 - 5.11 MIL/uL   Hemoglobin 9.6 (L) 12.0 - 15.0  g/dL   HCT 31.1 (L) 36.0 - 46.0 %   MCV 85.0 80.0 - 100.0 fL   MCH 26.2 26.0 - 34.0 pg   MCHC 30.9 30.0 - 36.0 g/dL   RDW 14.1 11.5 - 15.5 %   Platelets 461 (H) 150 - 400 K/uL   nRBC 0.0 0.0 - 0.2 %  Urinalysis, Routine w reflex microscopic  Result Value Ref Range   Color, Urine YELLOW YELLOW   APPearance HAZY (A) CLEAR   Specific Gravity, Urine 1.016 1.005 - 1.030   pH 6.0 5.0 - 8.0   Glucose, UA NEGATIVE NEGATIVE mg/dL   Hgb urine dipstick MODERATE (A) NEGATIVE   Bilirubin Urine NEGATIVE NEGATIVE   Ketones, ur NEGATIVE NEGATIVE mg/dL   Protein, ur NEGATIVE NEGATIVE mg/dL   Nitrite POSITIVE (A) NEGATIVE   Leukocytes,Ua LARGE (A) NEGATIVE   RBC / HPF 6-10 0 - 5 RBC/hpf   WBC, UA >50 (H) 0 - 5 WBC/hpf   Bacteria, UA RARE (A) NONE SEEN   Mucus PRESENT   Troponin I - Once  Result Value Ref Range   Troponin I <0.03 <0.03 ng/mL  Lactic acid, plasma  Result Value Ref Range   Lactic Acid, Venous 1.0 0.5 - 1.9 mmol/L  Differential  Result Value Ref Range   Neutrophils Relative % 74 %   Neutro Abs 9.6 (H) 1.7 - 7.7 K/uL   Lymphocytes Relative 9 %   Lymphs Abs 1.2 0.7 - 4.0 K/uL   Monocytes Relative 13 %   Monocytes Absolute 1.6 (H) 0.1 - 1.0 K/uL   Eosinophils Relative 3 %   Eosinophils Absolute 0.3 0.0 - 0.5 K/uL   Basophils Relative 1 %   Basophils Absolute 0.1 0.0 - 0.1 K/uL  Brain natriuretic peptide  Result Value Ref Range   B Natriuretic Peptide 73.0 0.0 - 100.0 pg/mL   Dg Chest 2 View Result Date: 04/10/2018 CLINICAL DATA:  Dehydration.  Confusion. EXAM: CHEST - 2 VIEW COMPARISON:  04/05/2018 FINDINGS: Patient is rotated towards the right. Hiatal hernia is again seen. There are new bilateral patchy pulmonary infiltrates with volume loss in the lower lobes. Findings are worrisome for bilateral lower lobe pneumonia. Heart failure could have a similar  appearance. IMPRESSION: New patchy infiltrates in the perihilar regions and lower lobes with volume loss. Pneumonia most likely. Some possibility this could be a manifestation of congestive heart failure. Electronically Signed   By: Nelson Chimes M.D.   On: 04/10/2018 15:27    Ct Head Wo Contrast Result Date: 04/10/2018 CLINICAL DATA:  Confusion/altered mental status EXAM: CT HEAD WITHOUT CONTRAST TECHNIQUE: Contiguous axial images were obtained from the base of the skull through the vertex without intravenous contrast. COMPARISON:  April 03, 2018 FINDINGS: Brain: There is stable diffuse atrophy. There is no intracranial mass, hemorrhage, extra-axial fluid collection, or midline shift. There is patchy small vessel disease in the centra semiovale bilaterally, stable. There is small vessel disease in each external capsule, more on the left than on the right, stable. No acute appearing infarct is evident. Vascular: There is no hyperdense vessel. There is calcification in each distal vertebral artery and in the carotid siphon regions bilaterally. Skull: The bony calvarium appears intact. Sinuses/Orbits: There is mucosal thickening in several ethmoid air cells. There is also mild mucosal thickening in the inferior right maxillary antrum. Orbits appear symmetric bilaterally. Other: Mastoid air cells are clear. There is debris in the right external auditory canal. IMPRESSION: Stable atrophy with supratentorial small vessel disease.  No acute infarct evident. No mass or hemorrhage. Foci of arterial vascular calcification noted. Areas of paranasal sinus disease noted. Probable cerumen in the right external auditory canal. Electronically Signed   By: Lowella Grip III M.D.   On: 04/10/2018 15:42   Results for AMBREEN, TUFTE (MRN [id number]) as of 04/10/2018 16:53  Ref. Range 04/06/2018 04:41 04/07/2018 04:19 04/08/2018 06:04 04/09/2018 06:20 04/10/2018 14:00  Hemoglobin Latest Ref Range: 12.0 - 15.0 g/dL 9.7 (L) 8.6 (L) 8.8 (L)  9.1 (L) 9.6 (L)  HCT Latest Ref Range: 36.0 - 46.0 % 31.0 (L) 28.5 (L) 28.9 (L) 29.3 (L) 31.1 (L)   Results for ASHYRA, CANTIN (MRN [id number]) as of 04/10/2018 16:53  Ref. Range 04/05/2018 14:39 04/06/2018 04:41 04/07/2018 04:19 04/08/2018 06:04 04/10/2018 14:00  Sodium Latest Ref Range: 135 - 145 mmol/L 133 (L) 135 136 131 (L) 130 (L)     1645:  No fever while in the ED. Pt given judicious IVF for she appears clinically dehydrated, is orthostatic on VS and has new hyponatremia on labs.  IV abx started for HCAP and UTI. T/C returned from [facility] Dr. Posey Pronto, case discussed, including:  HPI, pertinent PM/SHx, VS/PE, dx testing, ED course and treatment:  Agreeable to admit.        Final Clinical Impressions(s) / ED Diagnoses   Final diagnoses:  None    ED Discharge Orders    None       Francine Graven, DO 04/13/18 0938

## 2018-04-10 NOTE — ED Triage Notes (Signed)
Patient brought in by daughter for evaluation of dehydration.  Per patient I not hurting and don't know why the brought me her.

## 2018-04-11 DIAGNOSIS — R5381 Other malaise: Secondary | ICD-10-CM | POA: Diagnosis not present

## 2018-04-11 DIAGNOSIS — S72011D Unspecified intracapsular fracture of right femur, subsequent encounter for closed fracture with routine healing: Secondary | ICD-10-CM | POA: Diagnosis not present

## 2018-04-11 DIAGNOSIS — F419 Anxiety disorder, unspecified: Secondary | ICD-10-CM | POA: Diagnosis present

## 2018-04-11 DIAGNOSIS — I951 Orthostatic hypotension: Secondary | ICD-10-CM | POA: Diagnosis present

## 2018-04-11 DIAGNOSIS — K589 Irritable bowel syndrome without diarrhea: Secondary | ICD-10-CM | POA: Diagnosis present

## 2018-04-11 DIAGNOSIS — Z79899 Other long term (current) drug therapy: Secondary | ICD-10-CM | POA: Diagnosis not present

## 2018-04-11 DIAGNOSIS — N390 Urinary tract infection, site not specified: Secondary | ICD-10-CM | POA: Diagnosis present

## 2018-04-11 DIAGNOSIS — Z7401 Bed confinement status: Secondary | ICD-10-CM | POA: Diagnosis not present

## 2018-04-11 DIAGNOSIS — Z882 Allergy status to sulfonamides status: Secondary | ICD-10-CM | POA: Diagnosis not present

## 2018-04-11 DIAGNOSIS — Z79891 Long term (current) use of opiate analgesic: Secondary | ICD-10-CM | POA: Diagnosis not present

## 2018-04-11 DIAGNOSIS — E860 Dehydration: Secondary | ICD-10-CM | POA: Diagnosis present

## 2018-04-11 DIAGNOSIS — Y846 Urinary catheterization as the cause of abnormal reaction of the patient, or of later complication, without mention of misadventure at the time of the procedure: Secondary | ICD-10-CM | POA: Diagnosis present

## 2018-04-11 DIAGNOSIS — T83511A Infection and inflammatory reaction due to indwelling urethral catheter, initial encounter: Secondary | ICD-10-CM | POA: Diagnosis present

## 2018-04-11 DIAGNOSIS — Z881 Allergy status to other antibiotic agents status: Secondary | ICD-10-CM | POA: Diagnosis not present

## 2018-04-11 DIAGNOSIS — E871 Hypo-osmolality and hyponatremia: Secondary | ICD-10-CM | POA: Diagnosis present

## 2018-04-11 DIAGNOSIS — Y95 Nosocomial condition: Secondary | ICD-10-CM | POA: Diagnosis present

## 2018-04-11 DIAGNOSIS — R339 Retention of urine, unspecified: Secondary | ICD-10-CM | POA: Diagnosis present

## 2018-04-11 DIAGNOSIS — B965 Pseudomonas (aeruginosa) (mallei) (pseudomallei) as the cause of diseases classified elsewhere: Secondary | ICD-10-CM | POA: Diagnosis present

## 2018-04-11 DIAGNOSIS — J440 Chronic obstructive pulmonary disease with acute lower respiratory infection: Secondary | ICD-10-CM | POA: Diagnosis present

## 2018-04-11 DIAGNOSIS — R4182 Altered mental status, unspecified: Secondary | ICD-10-CM | POA: Diagnosis not present

## 2018-04-11 DIAGNOSIS — K219 Gastro-esophageal reflux disease without esophagitis: Secondary | ICD-10-CM | POA: Diagnosis present

## 2018-04-11 DIAGNOSIS — Z885 Allergy status to narcotic agent status: Secondary | ICD-10-CM | POA: Diagnosis not present

## 2018-04-11 DIAGNOSIS — G9341 Metabolic encephalopathy: Secondary | ICD-10-CM | POA: Diagnosis present

## 2018-04-11 DIAGNOSIS — Z888 Allergy status to other drugs, medicaments and biological substances status: Secondary | ICD-10-CM | POA: Diagnosis not present

## 2018-04-11 DIAGNOSIS — E785 Hyperlipidemia, unspecified: Secondary | ICD-10-CM | POA: Diagnosis present

## 2018-04-11 DIAGNOSIS — M810 Age-related osteoporosis without current pathological fracture: Secondary | ICD-10-CM | POA: Diagnosis present

## 2018-04-11 DIAGNOSIS — J189 Pneumonia, unspecified organism: Secondary | ICD-10-CM | POA: Diagnosis present

## 2018-04-11 LAB — CBC
HCT: 28.3 % — ABNORMAL LOW (ref 36.0–46.0)
Hemoglobin: 8.7 g/dL — ABNORMAL LOW (ref 12.0–15.0)
MCH: 26.3 pg (ref 26.0–34.0)
MCHC: 30.7 g/dL (ref 30.0–36.0)
MCV: 85.5 fL (ref 80.0–100.0)
Platelets: 401 10*3/uL — ABNORMAL HIGH (ref 150–400)
RBC: 3.31 MIL/uL — ABNORMAL LOW (ref 3.87–5.11)
RDW: 13.6 % (ref 11.5–15.5)
WBC: 9.4 10*3/uL (ref 4.0–10.5)
nRBC: 0 % (ref 0.0–0.2)

## 2018-04-11 LAB — BASIC METABOLIC PANEL
Anion gap: 8 (ref 5–15)
BUN: 13 mg/dL (ref 8–23)
CO2: 26 mmol/L (ref 22–32)
Calcium: 8.1 mg/dL — ABNORMAL LOW (ref 8.9–10.3)
Chloride: 98 mmol/L (ref 98–111)
Creatinine, Ser: 0.62 mg/dL (ref 0.44–1.00)
GFR calc Af Amer: >60 mL/min (ref >60)
GFR calc non Af Amer: >60 mL/min (ref >60)
Glucose, Bld: 96 mg/dL (ref 70–99)
Potassium: 4.1 mmol/L (ref 3.5–5.1)
Sodium: 132 mmol/L — ABNORMAL LOW (ref 135–145)

## 2018-04-11 MED ORDER — SODIUM CHLORIDE 0.9 % IV SOLN
INTRAVENOUS | Status: DC
  Administered 2018-04-11: 12:00:00 via INTRAVENOUS

## 2018-04-11 NOTE — Evaluation (Signed)
Physical Therapy Evaluation Patient Details Name: [name] MRN: [id number] DOB: Jul 12, 1934 Today's Date: 04/11/2018   History of Present Illness  [name] is a 84 y.o. female with medical history significant for COPD, GERD, osteoporosis, and anxiety who was recently admitted from 04/05/2018-04/09/2018 with a right hip fracture.  She underwent cannulated hip pinning on 04/06/2018.  It was recommended she be discharged to a skilled nursing facility however she refused and was discharged to home on 04/09/2018 with a Foley catheter in place due to urinary retention.  She returns to the ED today, 04/10/2018, with family reported increased confusion, generalized weakness, lethargy, and poor oral intake.  She denies any chest pain, dyspnea, abdominal pain, dysuria, nausea, vomiting, diarrhea, or peripheral edema.  She has not been out of bed due to her right hip pain.  She denies any loss of consciousness.    Clinical Impression  Patient limited for functional mobility as stated below secondary to BLE weakness, fatigue, right hip pain and poor standing balance.  Patient required much time and assistance to sit up at bedside and limited to a few steps at bedside mostly due to c/o fatigue/generalized weakness and tolerated sitting up in chair after therapy.  Patient will benefit from continued physical therapy in hospital and recommended venue below to increase strength, balance, endurance for safe ADLs and gait.     Follow Up Recommendations SNF;Supervision/Assistance - 24 hour;Supervision for mobility/OOB    Equipment Recommendations  None recommended by PT    Recommendations for Other Services       Precautions / Restrictions Precautions Precautions: Fall Restrictions Weight Bearing Restrictions: Yes RLE Weight Bearing: Weight bearing as tolerated      Mobility  Bed Mobility Overal bed mobility: Needs Assistance Bed Mobility: Supine to Sit     Supine to sit: Mod assist     General bed  mobility comments: slow labored movement with frequent rest breaks due to c/o fatigue, right hip soreness  Transfers Overall transfer level: Needs assistance Equipment used: Rolling walker (2 wheeled) Transfers: Sit to/from [facility] Sit to Stand: Min assist Stand pivot transfers: Min assist;Mod assist       General transfer comment: increased time, labored movement  Ambulation/Gait Ambulation/Gait assistance: Min assist;Mod assist Gait Distance (Feet): 5 Feet Assistive device: Rolling walker (2 wheeled) Gait Pattern/deviations: Decreased step length - right;Decreased step length - left;Decreased stride length Gait velocity: decreased   General Gait Details: limited to 5-6 slow labored steps at bedside due to c/o fatigue  Stairs            Wheelchair Mobility    Modified Rankin (Stroke Patients Only)       Balance Overall balance assessment: Needs assistance Sitting-balance support: Feet supported Sitting balance-Leahy Scale: Good     Standing balance support: Bilateral upper extremity supported;During functional activity Standing balance-Leahy Scale: Fair Standing balance comment: using RW                             Pertinent Vitals/Pain Pain Assessment: Faces Faces Pain Scale: Hurts little more Pain Location: mostly right hip Pain Descriptors / Indicators: Grimacing;Guarding Pain Intervention(s): Limited activity within patient's tolerance;Monitored during session    Home Living Family/patient expects to be discharged to:: Private residence Living Arrangements: Children Available Help at Discharge: Family;Available 24 hours/day Type of Home: Mobile home Home Access: Stairs to enter Entrance Stairs-Rails: Left;Right;Can reach both Entrance Stairs-Number of Steps: 5 Home Layout:  One level Home Equipment: [place] - 4 wheels;Bedside commode;Wheelchair - manual;Hospital bed;Cane - single point      Prior Function Level of  Independence: Needs assistance   Gait / Transfers Assistance Needed: On discharge on 4/5 pt using RW for functional mobility  ADL's / Homemaking Assistance Needed: On 4/5 discharge pt requiring set-up to min assist with UB tasks, max to total assist with LB tasks        Hand Dominance   Dominant Hand: Right    Extremity/Trunk Assessment   Upper Extremity Assessment Upper Extremity Assessment: Defer to OT evaluation    Lower Extremity Assessment Lower Extremity Assessment: Generalized weakness;RLE deficits/detail;LLE deficits/detail RLE Deficits / Details: grossly -4/5 LLE Deficits / Details: grossly 4+/5    Cervical / Trunk Assessment Cervical / Trunk Assessment: Kyphotic  Communication   Communication: HOH  Cognition Arousal/Alertness: Awake/alert Behavior During Therapy: WFL for tasks assessed/performed;Agitated Overall Cognitive Status: Impaired/Different from baseline Area of Impairment: Orientation;Following commands                 Orientation Level: Disoriented to;Time;Situation     Following Commands: Follows one step commands inconsistently       General Comments: requires increased time to follow directions      General Comments      Exercises     Assessment/Plan    PT Assessment Patient needs continued PT services  PT Problem List Decreased strength;Decreased activity tolerance;Decreased balance;Decreased mobility;Pain       PT Treatment Interventions Therapeutic exercise;Gait training;Stair training;Functional mobility training;Therapeutic activities;Patient/family education    PT Goals (Current goals can be found in the Care Plan section)  Acute Rehab PT Goals Patient Stated Goal: return home with family to assist PT Goal Formulation: With patient Time For Goal Achievement: 04/25/18 Potential to Achieve Goals: Good    Frequency Min 4X/week   Barriers to discharge        Co-evaluation               AM-PAC PT "6  Clicks" Mobility  Outcome Measure Help needed turning from your back to your side while in a flat bed without using bedrails?: A Lot Help needed moving from lying on your back to sitting on the side of a flat bed without using bedrails?: A Lot Help needed moving to and from a bed to a chair (including a wheelchair)?: A Lot Help needed standing up from a chair using your arms (e.g., wheelchair or bedside chair)?: A Lot Help needed to walk in hospital room?: A Lot Help needed climbing 3-5 steps with a railing? : A Lot 6 Click Score: 12    End of Session   Activity Tolerance: Patient tolerated treatment well;Patient limited by fatigue Patient left: in chair;with call bell/phone within reach Nurse Communication: Mobility status PT Visit Diagnosis: Unsteadiness on feet (R26.81);Other abnormalities of gait and mobility (R26.89);Muscle weakness (generalized) (M62.81)    Time: 4098-1191 PT Time Calculation (min) (ACUTE ONLY): 33 min   Charges:   PT Evaluation $PT Eval Moderate Complexity: 1 Mod PT Treatments $Therapeutic Activity: 23-37 mins        11:25 AM, 04/11/18 Lonell Grandchild, MPT Physical Therapist with [facility] [phone] office [phone] mobile phone

## 2018-04-11 NOTE — Progress Notes (Signed)
Subjective: She was readmitted yesterday.  She was discharged home on the fifth but almost immediately after she got home she stopped eating and drinking.  She had poor urinary output.  She became confused.  When she left the hospital she was awake and alert oriented and doing well with intake.  I told her family to bring her back to the emergency department which they did.  She was found to be dehydrated have a probable urinary tract infection and have possible pneumonia.  She says she feels better.  She says she wants to go home but she also says that every time she moves everything hurts.  She is on IV antibiotics.  Her Foley catheter was removed but she has been having trouble with urinary retention so she is going to require a Foley catheter for the foreseeable future until she is able to get in with urology.  Objective: Vital signs in last 24 hours: Temp:  [97.9 F (36.6 C)-98.7 F (37.1 C)] 98.4 F (36.9 C) (04/07 0623) Pulse Rate:  [80-86] 84 (04/07 0623) Resp:  [9-19] 18 (04/07 0623) BP: (91-134)/(67-100) 134/73 (04/07 0623) SpO2:  [91 %-97 %] 94 % (04/07 0623) Weight:  [52.2 kg-54.1 kg] 54.1 kg (04/07 0615) Weight change:  Last BM Date: 04/10/18  Intake/Output from previous day: 04/06 0701 - 04/07 0700 In: 1479.9 [I.V.:853.8; IV Piggyback:626.2] Out: 401 [Urine:400; Stool:1]  PHYSICAL EXAM General appearance: alert, cooperative and mild distress Resp: rhonchi bilaterally Cardio: regular rate and rhythm, S1, S2 normal, no murmur, click, rub or gallop GI: soft, non-tender; bowel sounds normal; no masses,  no organomegaly Extremities: Incision site looks okay  Lab Results:  Results for orders placed or performed during the hospital encounter of 04/10/18 (from the past 48 hour(s))  Basic metabolic panel     Status: Abnormal   Collection Time: 04/10/18  2:00 PM  Result Value Ref Range   Sodium 130 (L) 135 - 145 mmol/L   Potassium 4.4 3.5 - 5.1 mmol/L   Chloride 96 (L) 98 -  111 mmol/L   CO2 24 22 - 32 mmol/L   Glucose, Bld 103 (H) 70 - 99 mg/dL   BUN 17 8 - 23 mg/dL   Creatinine, Ser 0.69 0.44 - 1.00 mg/dL   Calcium 8.5 (L) 8.9 - 10.3 mg/dL   GFR calc non Af Amer >60 >60 mL/min   GFR calc Af Amer >60 >60 mL/min   Anion gap 10 5 - 15    Comment: Performed at [facility], [address], [place], [place] [address]  CBC     Status: Abnormal   Collection Time: 04/10/18  2:00 PM  Result Value Ref Range   WBC 13.1 (H) 4.0 - 10.5 K/uL   RBC 3.66 (L) 3.87 - 5.11 MIL/uL   Hemoglobin 9.6 (L) 12.0 - 15.0 g/dL   HCT 31.1 (L) 36.0 - 46.0 %   MCV 85.0 80.0 - 100.0 fL   MCH 26.2 26.0 - 34.0 pg   MCHC 30.9 30.0 - 36.0 g/dL   RDW 14.1 11.5 - 15.5 %   Platelets 461 (H) 150 - 400 K/uL   nRBC 0.0 0.0 - 0.2 %    Comment: Performed at [facility], [address] [place], [place] [address]  Differential     Status: Abnormal   Collection Time: 04/10/18  2:00 PM  Result Value Ref Range   Neutrophils Relative % 74 %   Neutro Abs 9.6 (H) 1.7 - 7.7 K/uL   Lymphocytes  Relative 9 %   Lymphs Abs 1.2 0.7 - 4.0 K/uL   Monocytes Relative 13 %   Monocytes Absolute 1.6 (H) 0.1 - 1.0 K/uL   Eosinophils Relative 3 %   Eosinophils Absolute 0.3 0.0 - 0.5 K/uL   Basophils Relative 1 %   Basophils Absolute 0.1 0.0 - 0.1 K/uL    Comment: Performed at [facility], [address] [place], [place] [address]  Urine rapid drug screen (hosp performed)     Status: Abnormal   Collection Time: 04/10/18  2:00 PM  Result Value Ref Range   Opiates NONE DETECTED NONE DETECTED   Cocaine NONE DETECTED NONE DETECTED   Benzodiazepines POSITIVE (A) NONE DETECTED   Amphetamines NONE DETECTED NONE DETECTED   Tetrahydrocannabinol NONE DETECTED NONE DETECTED   Barbiturates NONE DETECTED NONE DETECTED    Comment: (NOTE) DRUG SCREEN FOR MEDICAL PURPOSES ONLY.  IF CONFIRMATION IS NEEDED FOR ANY PURPOSE, NOTIFY LAB WITHIN 5 DAYS. LOWEST DETECTABLE LIMITS FOR URINE DRUG SCREEN Drug Class                      Cutoff (ng/mL) Amphetamine and metabolites    1000 Barbiturate and metabolites    200 Benzodiazepine                 341 Tricyclics and metabolites     300 Opiates and metabolites        300 Cocaine and metabolites        300 THC                            50 Performed at [facility], [address] [place], [place] [address]   Urinalysis, Routine w reflex microscopic     Status: Abnormal   Collection Time: 04/10/18  2:44 PM  Result Value Ref Range   Color, Urine YELLOW YELLOW   APPearance HAZY (A) CLEAR   Specific Gravity, Urine 1.016 1.005 - 1.030   pH 6.0 5.0 - 8.0   Glucose, UA NEGATIVE NEGATIVE mg/dL   Hgb urine dipstick MODERATE (A) NEGATIVE   Bilirubin Urine NEGATIVE NEGATIVE   Ketones, ur NEGATIVE NEGATIVE mg/dL   Protein, ur NEGATIVE NEGATIVE mg/dL   Nitrite POSITIVE (A) NEGATIVE   Leukocytes,Ua LARGE (A) NEGATIVE   RBC / HPF 6-10 0 - 5 RBC/hpf   WBC, UA >50 (H) 0 - 5 WBC/hpf   Bacteria, UA RARE (A) NONE SEEN   Mucus PRESENT     Comment: Performed at [facility], [address], [place], [place] [address]  Brain natriuretic peptide     Status: None   Collection Time: 04/10/18  2:45 PM  Result Value Ref Range   B Natriuretic Peptide 73.0 0.0 - 100.0 pg/mL    Comment: Performed at [facility], [address], [place], [place] [address]  Troponin I - Once     Status: None   Collection Time: 04/10/18  2:55 PM  Result Value Ref Range   Troponin I <0.03 <0.03 ng/mL    Comment: Performed at [facility], [address] [place], [place] [address]  Lactic acid, plasma     Status: None   Collection Time: 04/10/18  2:55 PM  Result Value Ref Range   Lactic Acid, Venous 1.0 0.5 - 1.9 mmol/L    Comment: Performed at [facility], [address] [place],  [address]  Lactic acid, plasma     Status: None  Collection Time: 04/10/18  4:48 PM  Result Value Ref Range   Lactic Acid, Venous 1.1 0.5 - 1.9 mmol/L    Comment: Performed at [facility], [address], [place], [place] [address]  Basic metabolic panel     Status: Abnormal   Collection Time: 04/11/18  5:01 AM  Result Value Ref Range   Sodium 132 (L) 135 - 145 mmol/L   Potassium 4.1 3.5 - 5.1 mmol/L   Chloride 98 98 - 111 mmol/L   CO2 26 22 - 32 mmol/L   Glucose, Bld 96 70 - 99 mg/dL   BUN 13 8 - 23 mg/dL   Creatinine, Ser 0.62 0.44 - 1.00 mg/dL   Calcium 8.1 (L) 8.9 - 10.3 mg/dL   GFR calc non Af Amer >60 >60 mL/min   GFR calc Af Amer >60 >60 mL/min   Anion gap 8 5 - 15    Comment: Performed at [facility], [address] [place], [place] [address]  CBC     Status: Abnormal   Collection Time: 04/11/18  5:01 AM  Result Value Ref Range   WBC 9.4 4.0 - 10.5 K/uL   RBC 3.31 (L) 3.87 - 5.11 MIL/uL   Hemoglobin 8.7 (L) 12.0 - 15.0 g/dL   HCT 28.3 (L) 36.0 - 46.0 %   MCV 85.5 80.0 - 100.0 fL   MCH 26.3 26.0 - 34.0 pg   MCHC 30.7 30.0 - 36.0 g/dL   RDW 13.6 11.5 - 15.5 %   Platelets 401 (H) 150 - 400 K/uL   nRBC 0.0 0.0 - 0.2 %    Comment: Performed at [facility], [address] [place], [place] [address]    ABGS No results for input(s): PHART, PO2ART, TCO2, HCO3 in the last 72 hours.  Invalid input(s): PCO2 CULTURES Recent Results (from the past 240 hour(s))  Culture, blood (routine x 2)     Status: None   Collection Time: 04/03/18 11:36 AM  Result Value Ref Range Status   Specimen Description BLOOD RIGHT ARM  Final   Special Requests   Final    BOTTLES DRAWN AEROBIC AND ANAEROBIC Blood Culture adequate volume   Culture   Final    NO GROWTH 5 DAYS Performed at [facility], [address] [place], [place] [address]    Report Status 04/08/2018 FINAL  Final  Culture, blood (routine x 2)     Status: None   Collection Time: 04/03/18 11:42 AM  Result Value Ref Range Status   Specimen Description BLOOD RIGHT HAND  Final   Special Requests   Final    BOTTLES DRAWN AEROBIC AND ANAEROBIC Blood Culture adequate volume   Culture   Final    NO GROWTH 5 DAYS Performed at [facility], [address] [place], [place] [address]    Report Status 04/08/2018 FINAL  Final  Surgical pcr screen     Status: None   Collection Time: 04/06/18  1:18 AM  Result Value Ref Range Status   MRSA, PCR NEGATIVE NEGATIVE Final   Staphylococcus aureus NEGATIVE NEGATIVE Final    Comment: (NOTE) The Xpert SA Assay (FDA approved for NASAL specimens in patients 5 years of age and older), is one component of a comprehensive surveillance program. It is not intended to diagnose infection nor to guide or monitor treatment. Performed at [facility], [address], [place], [place] [address]    Studies/Results: Dg Chest 2 View  Result Date: 04/10/2018 CLINICAL DATA:  Dehydration.  Confusion. EXAM: CHEST - 2 VIEW COMPARISON:  04/05/2018 FINDINGS: Patient is rotated towards the right. Hiatal hernia is again seen. There are new bilateral patchy pulmonary infiltrates with volume loss in the lower lobes. Findings are worrisome for bilateral lower lobe pneumonia. Heart failure could have a similar appearance. IMPRESSION: New patchy infiltrates in the perihilar regions and lower lobes with volume loss. Pneumonia most likely. Some possibility this could be a manifestation of congestive heart failure. Electronically Signed   By: Nelson Chimes M.D.   On: 04/10/2018 15:27   Ct Head Wo Contrast  Result Date: 04/10/2018 CLINICAL DATA:  Confusion/altered mental status EXAM: CT HEAD WITHOUT CONTRAST TECHNIQUE: Contiguous axial images were obtained from the base of the skull through the vertex without intravenous contrast. COMPARISON:  April 03, 2018 FINDINGS: Brain: There is stable diffuse atrophy. There is no intracranial mass, hemorrhage, extra-axial fluid collection, or midline shift. There is patchy small vessel disease in the centra semiovale bilaterally, stable. There is small vessel disease in each external capsule, more on the left than on the right, stable. No acute appearing infarct is evident. Vascular:  There is no hyperdense vessel. There is calcification in each distal vertebral artery and in the carotid siphon regions bilaterally. Skull: The bony calvarium appears intact. Sinuses/Orbits: There is mucosal thickening in several ethmoid air cells. There is also mild mucosal thickening in the inferior right maxillary antrum. Orbits appear symmetric bilaterally. Other: Mastoid air cells are clear. There is debris in the right external auditory canal. IMPRESSION: Stable atrophy with supratentorial small vessel disease. No acute infarct evident. No mass or hemorrhage. Foci of arterial vascular calcification noted. Areas of paranasal sinus disease noted. Probable cerumen in the right external auditory canal. Electronically Signed   By: Lowella Grip III M.D.   On: 04/10/2018 15:42    Medications:  Prior to Admission:  Medications Prior to Admission  Medication Sig Dispense Refill Last Dose  . albuterol (PROAIR HFA) 108 (90 Base) MCG/ACT inhaler Inhale 2 puffs into the lungs every 4 (four) hours as needed (4-6 hours).    Past Week at Unknown time  . albuterol (PROVENTIL) (2.5 MG/3ML) 0.083% nebulizer solution Take 2.5 mg by nebulization every 6 (six) hours as needed for wheezing or shortness of breath.   Past Week at Unknown time  . ALPRAZolam (XANAX) 1 MG tablet Take 0.5 tablets (0.5 mg total) by mouth 3 (three) times daily as needed for anxiety. (Patient taking differently: Take 1 mg by mouth 3 (three) times daily as needed for anxiety. ) 30 tablet 0 04/10/2018 at Unknown time  . cetirizine (ZYRTEC) 10 MG tablet Take 10 mg by mouth daily.   Past Week at Unknown time  . docusate sodium (COLACE) 100 MG capsule Take 1 capsule (100 mg total) by mouth 2 (two) times daily. 10 capsule 0 Past Week at Unknown time  . furosemide (LASIX) 40 MG tablet Take 40 mg by mouth daily.   Past Week at Unknown time  . pantoprazole (PROTONIX) 40 MG tablet TAKE (1) TABLET BY MOUTH TWICE A DAY WITH MEALS (BREAKFAST AND SUPPER)  (Patient taking differently: Take 40 mg by mouth daily as needed. ) 60 tablet 3 Past Week at Unknown time  . potassium chloride (K-DUR) 10 MEQ tablet Take 10 mEq by mouth daily.   Past Week at Unknown time  . traMADol (ULTRAM) 50 MG tablet Take 100 mg by mouth every 4 (four) hours as needed for moderate pain or severe pain.  04/10/2018 at Unknown time  . umeclidinium-vilanterol (ANORO ELLIPTA) 62.5-25 MCG/INH AEPB Inhale 1 puff into the lungs daily.   Past Week at Unknown time  . VOLTAREN 1 % GEL Apply 1 g topically daily as needed. pain   Past Week at Unknown time  . zolpidem (AMBIEN) 5 MG tablet Take 5 mg by mouth at bedtime.    Past Week at Unknown time   Scheduled: . diclofenac sodium  4 g Topical QID  . enoxaparin (LOVENOX) injection  40 mg Subcutaneous Q24H  . umeclidinium-vilanterol  1 puff Inhalation Daily   Continuous: . cefTRIAXone (ROCEPHIN)  IV 1 g (04/10/18 2112)   GQB:[id number] **OR** acetaminophen, albuterol, ALPRAZolam, pantoprazole, traMADol  Assesment: She has altered mental status likely related to the UTI.  She had previous hip fracture and was discharged from the hospital after surgery for that on the fifth.  She has had trouble with urinary retention and she looks like she is got a UTI potentially related to the catheter but she cannot urinate without the catheter so I am going to have a Foley put back in.  She has COPD at baseline with significant shortness of breath and suggestion of pneumonia on chest x-ray  She had poor oral intake and I think she is dehydrated Principal Problem:   Altered mental status Active Problems:   Closed right hip fracture (HCC)   GERD (gastroesophageal reflux disease)   COPD (chronic obstructive pulmonary disease) (HCC)   Urinary retention   Anxiety   Hyponatremia   UTI (urinary tract infection) due to urinary indwelling catheter ([place])    Plan: Continue treatments.  Full admission.    LOS: 0 days   Alonza Bogus 04/11/2018, 8:44 AM

## 2018-04-11 NOTE — Evaluation (Signed)
Occupational Therapy Evaluation Patient Details Name: [name] MRN: [id number] DOB: 1934-02-08 Today's Date: 04/11/2018    History of Present Illness PERI KREFT is a 84 y.o. female with medical history significant for COPD, GERD, osteoporosis, and anxiety who was recently admitted from 04/05/2018-04/09/2018 with a right hip fracture.  She underwent cannulated hip pinning on 04/06/2018.  It was recommended she be discharged to a skilled nursing facility however she refused and was discharged to home on 04/09/2018 with a Foley catheter in place due to urinary retention.  She returns to the ED today, 04/10/2018, with family reported increased confusion, generalized weakness, lethargy, and poor oral intake.  She denies any chest pain, dyspnea, abdominal pain, dysuria, nausea, vomiting, diarrhea, or peripheral edema.  She has not been out of bed due to her right hip pain.  She denies any loss of consciousness.   Clinical Impression   Pt received supine in bed, agreeable to OT evaluation. Pt with change in cognition compared to OT evaluation during previous admission on 04/07/18. Pt unable/unwilling to follow simple one-step commands today, would agree to task then never follow through. Pt reporting pain with any movement-arms, legs, hands, etc. Pt guarding LUE, would not actively move or allow passive movement. Pt did complete elbow flexion/extension and allow passive stretching of RUE. Pt also using RUE to eat. Pt becoming more alert and communicative during breakfast, will attempt further evaluation tomorrow. At this time pt is not safe to return home, recommend SNF on discharge to improve safety and independence in ADL and functional mobility tasks.     Follow Up Recommendations  SNF    Equipment Recommendations  None recommended by OT       Precautions / Restrictions Precautions Precautions: Fall Restrictions Weight Bearing Restrictions: Yes RLE Weight Bearing: Weight bearing as tolerated       Mobility Bed Mobility               General bed mobility comments: Pt initially agreeable to bed mobility, however would not follow through with task. Refusing with OT assist and reporting pain with any minimal touch however unable to specify where  Transfers                 General transfer comment: Refused to complete        ADL either performed or assessed with clinical judgement   ADL Overall ADL's : Needs assistance/impaired Eating/Feeding: Set up;Bed level Eating/Feeding Details (indicate cue type and reason): Pt required set-up of meal tray, did use RUE to eat and drink. Would not use LUE to assist                                    General ADL Comments: Pt refusing all other ADLs this am, reporting pain "all over" with passive movement.      Vision Baseline Vision/History: No visual deficits Patient Visual Report: No change from baseline Vision Assessment?: No apparent visual deficits            Pertinent Vitals/Pain Pain Assessment: Faces Faces Pain Scale: Hurts little more Pain Location: all joints upon movement Pain Descriptors / Indicators: Grimacing;Guarding Pain Intervention(s): Limited activity within patient's tolerance;Monitored during session;Repositioned     Hand Dominance Right   Extremity/Trunk Assessment Upper Extremity Assessment Upper Extremity Assessment: Generalized weakness(pt did not move LUE at all, used RUE for feeding)   Lower Extremity Assessment Lower Extremity Assessment:  Defer to PT evaluation       Communication Communication Communication: HOH   Cognition Arousal/Alertness: Awake/alert Behavior During Therapy: WFL for tasks assessed/performed;Agitated Overall Cognitive Status: Impaired/Different from baseline Area of Impairment: Orientation;Following commands                 Orientation Level: Disoriented to;Time;Situation     Following Commands: Follows one step commands  inconsistently       General Comments: Pt lethargic during session, agreeable to participate however not following through with commands and confused when OT provided verbal cuing              Home Living   Living Arrangements: Children Available Help at Discharge: Family;Available 24 hours/day Type of Home: Mobile home Home Access: Stairs to enter Entrance Stairs-Number of Steps: 5 Entrance Stairs-Rails: Left;Right;Can reach both Home Layout: One level     Bathroom Shower/Tub: Teacher, early years/pre: Standard     Home Equipment: Environmental consultant - 4 wheels;Bedside commode;Wheelchair - manual;Hospital bed;Cane - single point          Prior Functioning/Environment Level of Independence: Needs assistance  Gait / Transfers Assistance Needed: On discharge on 4/5 pt using RW for functional mobility ADL's / Homemaking Assistance Needed: On 4/5 discharge pt requiring set-up to min assist with UB tasks, max to total assist with LB tasks            OT Problem List: Decreased strength;Decreased range of motion;Decreased activity tolerance;Impaired balance (sitting and/or standing);Decreased cognition;Decreased safety awareness;Decreased knowledge of use of DME or AE;Decreased coordination;Impaired UE functional use;Pain      OT Treatment/Interventions: Self-care/ADL training;Therapeutic exercise;Neuromuscular education;DME and/or AE instruction;Therapeutic activities;Patient/family education    OT Goals(Current goals can be found in the care plan section) Acute Rehab OT Goals Patient Stated Goal: return home with family to assist OT Goal Formulation: With patient Time For Goal Achievement: 04/25/18 Potential to Achieve Goals: Good  OT Frequency: Min 2X/week   Barriers to D/C: Inaccessible home environment;Decreased caregiver support             AM-PAC OT "6 Clicks" Daily Activity     Outcome Measure Help from another person eating meals?: None Help from another  person taking care of personal grooming?: A Little Help from another person toileting, which includes using toliet, bedpan, or urinal?: A Lot Help from another person bathing (including washing, rinsing, drying)?: A Lot Help from another person to put on and taking off regular upper body clothing?: A Lot Help from another person to put on and taking off regular lower body clothing?: A Lot 6 Click Score: 15   End of Session    Activity Tolerance: Patient limited by lethargy;Patient limited by fatigue Patient left: in bed;with call bell/phone within reach;with bed alarm set  OT Visit Diagnosis: Muscle weakness (generalized) (M62.81)                Time: 6761-9509 OT Time Calculation (min): 32 min Charges:  OT General Charges $OT Visit: 1 Visit OT Evaluation $OT Eval Moderate Complexity: [place], OTR/L  [phone] 04/11/2018, 8:50 AM

## 2018-04-11 NOTE — Progress Notes (Signed)
Patient has had no urine output this shift. Bladder scan reading 350 ml's. In and out catheter performed. 400 ml output of dark, yellow urine. Patient denies discomfort at this time.

## 2018-04-11 NOTE — Plan of Care (Signed)
  Problem: Acute Rehab OT Goals (only OT should resolve) Goal: Pt. Will Perform Eating Flowsheets (Taken 04/11/2018 0854) Pt Will Perform Eating: with modified independence; sitting Goal: Pt. Will Perform Grooming Flowsheets (Taken 04/11/2018 0854) Pt Will Perform Grooming: with set-up; sitting Goal: Pt. Will Perform Upper Body Dressing Flowsheets (Taken 04/11/2018 0854) Pt Will Perform Upper Body Dressing: with modified independence; sitting Goal: Pt. Will Transfer To Toilet Flowsheets (Taken 04/11/2018 [phone]) Pt Will Transfer to Toilet: with min guard assist; stand pivot transfer; ambulating; bedside commode; regular height toilet Goal: Pt. Will Perform Toileting-Clothing Manipulation Flowsheets (Taken 04/11/2018 0854) Pt Will Perform Toileting - Clothing Manipulation and hygiene: with min guard assist; sitting/lateral leans; sit to/from stand Goal: Pt/Caregiver Will Perform Home Exercise Program Flowsheets (Taken 04/11/2018 [phone]) Pt/caregiver will Perform Home Exercise Program: Increased strength; Both right and left upper extremity; Independently; With written HEP provided

## 2018-04-11 NOTE — Plan of Care (Signed)
  Problem: Acute Rehab PT Goals(only PT should resolve) Goal: Pt Will Go Supine/Side To Sit Outcome: Progressing Flowsheets (Taken 04/11/2018 1127) Pt will go Supine/Side to Sit: with minimal assist Goal: Patient Will Transfer Sit To/From Stand Outcome: Progressing Flowsheets (Taken 04/11/2018 1127) Patient will transfer sit to/from stand: with min guard assist; with minimal assist Goal: Pt Will Transfer Bed To Chair/Chair To Bed Outcome: Progressing Flowsheets (Taken 04/11/2018 1127) Pt will Transfer Bed to Chair/Chair to Bed: with min assist Goal: Pt Will Ambulate Outcome: Progressing Flowsheets (Taken 04/11/2018 1127) Pt will Ambulate: 50 feet; with minimal assist; with rolling walker   11:27 AM, 04/11/18 Lonell Grandchild, MPT Physical Therapist with [facility] [phone] office [phone] mobile phone

## 2018-04-12 LAB — CBC WITH DIFFERENTIAL/PLATELET
Abs Immature Granulocytes: 0.16 10*3/uL — ABNORMAL HIGH (ref 0.00–0.07)
Basophils Absolute: 0.1 10*3/uL (ref 0.0–0.1)
Basophils Relative: 1 %
Eosinophils Absolute: 0.4 10*3/uL (ref 0.0–0.5)
Eosinophils Relative: 4 %
HCT: 27.1 % — ABNORMAL LOW (ref 36.0–46.0)
Hemoglobin: 8.3 g/dL — ABNORMAL LOW (ref 12.0–15.0)
Immature Granulocytes: 2 %
Lymphocytes Relative: 12 %
Lymphs Abs: 1.1 10*3/uL (ref 0.7–4.0)
MCH: 26 pg (ref 26.0–34.0)
MCHC: 30.6 g/dL (ref 30.0–36.0)
MCV: 85 fL (ref 80.0–100.0)
Monocytes Absolute: 1.2 10*3/uL — ABNORMAL HIGH (ref 0.1–1.0)
Monocytes Relative: 13 %
Neutro Abs: 6.3 10*3/uL (ref 1.7–7.7)
Neutrophils Relative %: 68 %
Platelets: 392 10*3/uL (ref 150–400)
RBC: 3.19 MIL/uL — ABNORMAL LOW (ref 3.87–5.11)
RDW: 13.8 % (ref 11.5–15.5)
WBC: 9.1 10*3/uL (ref 4.0–10.5)
nRBC: 0 % (ref 0.0–0.2)

## 2018-04-12 LAB — COMPREHENSIVE METABOLIC PANEL
ALT: 8 U/L (ref 0–44)
AST: 15 U/L (ref 15–41)
Albumin: 2.1 g/dL — ABNORMAL LOW (ref 3.5–5.0)
Alkaline Phosphatase: 74 U/L (ref 38–126)
Anion gap: 7 (ref 5–15)
BUN: 12 mg/dL (ref 8–23)
CO2: 25 mmol/L (ref 22–32)
Calcium: 8.3 mg/dL — ABNORMAL LOW (ref 8.9–10.3)
Chloride: 103 mmol/L (ref 98–111)
Creatinine, Ser: 0.5 mg/dL (ref 0.44–1.00)
GFR calc Af Amer: >60 mL/min (ref >60)
GFR calc non Af Amer: >60 mL/min (ref >60)
Glucose, Bld: 104 mg/dL — ABNORMAL HIGH (ref 70–99)
Potassium: 3.8 mmol/L (ref 3.5–5.1)
Sodium: 135 mmol/L (ref 135–145)
Total Bilirubin: 0.2 mg/dL — ABNORMAL LOW (ref 0.3–1.2)
Total Protein: 5.4 g/dL — ABNORMAL LOW (ref 6.5–8.1)

## 2018-04-12 NOTE — Progress Notes (Signed)
[name] called with concerns about pt not having iv fluids infusing. [name] is concerned that the pt will become dehydrated again once discontinued from hospital. Spoke with daughter about encouraging pt to drink fluids.

## 2018-04-12 NOTE — Care Management Important Message (Signed)
Important Message  Patient Details  Name: [name] MRN: [id number] Date of Birth: 1934/08/03   Medicare Important Message Given:  Yes    Tommy Medal 04/12/2018, 11:10 AM

## 2018-04-12 NOTE — Progress Notes (Signed)
Subjective: She says she feels well.  She has no new complaints.  She had significant problem with hurting all over yesterday and she says that is better.  She is sitting up in a chair eating breakfast.  Objective: Vital signs in last 24 hours: Temp:  [98.2 F (36.8 C)-98.3 F (36.8 C)] 98.2 F (36.8 C) (04/08 0514) Pulse Rate:  [73-84] 73 (04/08 0514) Resp:  [18] 18 (04/07 2135) BP: (107-116)/(67-69) 116/67 (04/08 0514) SpO2:  [93 %-99 %] 93 % (04/08 0744) Weight:  [54.8 kg] 54.8 kg (04/08 0620) Weight change: 1.865 kg Last BM Date: 04/10/18  Intake/Output from previous day: 04/07 0701 - 04/08 0700 In: 1373.7 [P.O.:600; I.V.:673.7; IV Piggyback:100] Out: 2050 [Urine:2050]  PHYSICAL EXAM General appearance: alert, cooperative and no distress Resp: clear to auscultation bilaterally Cardio: regular rate and rhythm, S1, S2 normal, no murmur, click, rub or gallop GI: soft, non-tender; bowel sounds normal; no masses,  no organomegaly Extremities: Surgical site okay  Lab Results:  Results for orders placed or performed during the hospital encounter of 04/10/18 (from the past 48 hour(s))  Basic metabolic panel     Status: Abnormal   Collection Time: 04/10/18  2:00 PM  Result Value Ref Range   Sodium 130 (L) 135 - 145 mmol/L   Potassium 4.4 3.5 - 5.1 mmol/L   Chloride 96 (L) 98 - 111 mmol/L   CO2 24 22 - 32 mmol/L   Glucose, Bld 103 (H) 70 - 99 mg/dL   BUN 17 8 - 23 mg/dL   Creatinine, Ser 0.69 0.44 - 1.00 mg/dL   Calcium 8.5 (L) 8.9 - 10.3 mg/dL   GFR calc non Af Amer >60 >60 mL/min   GFR calc Af Amer >60 >60 mL/min   Anion gap 10 5 - 15    Comment: Performed at [facility], [address] [place], [place] [address]  CBC     Status: Abnormal   Collection Time: 04/10/18  2:00 PM  Result Value Ref Range   WBC 13.1 (H) 4.0 - 10.5 K/uL   RBC 3.66 (L) 3.87 - 5.11 MIL/uL   Hemoglobin 9.6 (L) 12.0 - 15.0 g/dL   HCT 31.1 (L) 36.0 - 46.0 %   MCV 85.0 80.0 - 100.0 fL   MCH 26.2  26.0 - 34.0 pg   MCHC 30.9 30.0 - 36.0 g/dL   RDW 14.1 11.5 - 15.5 %   Platelets 461 (H) 150 - 400 K/uL   nRBC 0.0 0.0 - 0.2 %    Comment: Performed at [facility], [address] [place], [place] [address]  Differential     Status: Abnormal   Collection Time: 04/10/18  2:00 PM  Result Value Ref Range   Neutrophils Relative % 74 %   Neutro Abs 9.6 (H) 1.7 - 7.7 K/uL   Lymphocytes Relative 9 %   Lymphs Abs 1.2 0.7 - 4.0 K/uL   Monocytes Relative 13 %   Monocytes Absolute 1.6 (H) 0.1 - 1.0 K/uL   Eosinophils Relative 3 %   Eosinophils Absolute 0.3 0.0 - 0.5 K/uL   Basophils Relative 1 %   Basophils Absolute 0.1 0.0 - 0.1 K/uL    Comment: Performed at [facility], [address], [place], [place] [address]  Urine rapid drug screen (hosp performed)     Status: Abnormal   Collection Time: 04/10/18  2:00 PM  Result Value Ref Range   Opiates NONE DETECTED NONE DETECTED   Cocaine NONE DETECTED NONE DETECTED  Benzodiazepines POSITIVE (A) NONE DETECTED   Amphetamines NONE DETECTED NONE DETECTED   Tetrahydrocannabinol NONE DETECTED NONE DETECTED   Barbiturates NONE DETECTED NONE DETECTED    Comment: (NOTE) DRUG SCREEN FOR MEDICAL PURPOSES ONLY.  IF CONFIRMATION IS NEEDED FOR ANY PURPOSE, NOTIFY LAB WITHIN 5 DAYS. LOWEST DETECTABLE LIMITS FOR URINE DRUG SCREEN Drug Class                     Cutoff (ng/mL) Amphetamine and metabolites    1000 Barbiturate and metabolites    200 Benzodiazepine                 371 Tricyclics and metabolites     300 Opiates and metabolites        300 Cocaine and metabolites        300 THC                            50 Performed at [facility], [address] [place], [place] [address]   Urinalysis, Routine w reflex microscopic     Status: Abnormal   Collection Time: 04/10/18  2:44 PM  Result Value Ref Range   Color, Urine YELLOW YELLOW   APPearance HAZY (A) CLEAR   Specific Gravity, Urine 1.016 1.005 - 1.030   pH 6.0 5.0 - 8.0   Glucose, UA  NEGATIVE NEGATIVE mg/dL   Hgb urine dipstick MODERATE (A) NEGATIVE   Bilirubin Urine NEGATIVE NEGATIVE   Ketones, ur NEGATIVE NEGATIVE mg/dL   Protein, ur NEGATIVE NEGATIVE mg/dL   Nitrite POSITIVE (A) NEGATIVE   Leukocytes,Ua LARGE (A) NEGATIVE   RBC / HPF 6-10 0 - 5 RBC/hpf   WBC, UA >50 (H) 0 - 5 WBC/hpf   Bacteria, UA RARE (A) NONE SEEN   Mucus PRESENT     Comment: Performed at [facility], [address] [place], [place] [address]  Urine culture     Status: Abnormal (Preliminary result)   Collection Time: 04/10/18  2:44 PM  Result Value Ref Range   Specimen Description      URINE, CLEAN CATCH Performed at [facility], [address] [place], [place] [address]    Special Requests      NONE Performed at [facility], [address] [place], [place] [address]    Culture (A)     >=100,000 COLONIES/mL PSEUDOMONAS AERUGINOSA SUSCEPTIBILITIES TO FOLLOW Performed at [place] [address] [place], [place] [address]    Report Status PENDING   Brain natriuretic peptide     Status: None   Collection Time: 04/10/18  2:45 PM  Result Value Ref Range   B Natriuretic Peptide 73.0 0.0 - 100.0 pg/mL    Comment: Performed at [facility], [address] [place], [place] [address]  Troponin I - Once     Status: None   Collection Time: 04/10/18  2:55 PM  Result Value Ref Range   Troponin I <0.03 <0.03 ng/mL    Comment: Performed at [facility], [address], [place], [place] [address]  Lactic acid, plasma     Status: None   Collection Time: 04/10/18  2:55 PM  Result Value Ref Range   Lactic Acid, Venous 1.0 0.5 - 1.9 mmol/L    Comment: Performed at [facility], [address] [place], [place] [address]  Lactic acid, plasma     Status: None   Collection Time: 04/10/18  4:48 PM  Result  Value Ref Range   Lactic Acid, Venous 1.1 0.5 - 1.9 mmol/L    Comment: Performed at [facility], [address] [place], [place] [address]  Basic metabolic panel     Status: Abnormal    Collection Time: 04/11/18  5:01 AM  Result Value Ref Range   Sodium 132 (L) 135 - 145 mmol/L   Potassium 4.1 3.5 - 5.1 mmol/L   Chloride 98 98 - 111 mmol/L   CO2 26 22 - 32 mmol/L   Glucose, Bld 96 70 - 99 mg/dL   BUN 13 8 - 23 mg/dL   Creatinine, Ser 0.62 0.44 - 1.00 mg/dL   Calcium 8.1 (L) 8.9 - 10.3 mg/dL   GFR calc non Af Amer >60 >60 mL/min   GFR calc Af Amer >60 >60 mL/min   Anion gap 8 5 - 15    Comment: Performed at [facility], [address] [place], [place] [address]  CBC     Status: Abnormal   Collection Time: 04/11/18  5:01 AM  Result Value Ref Range   WBC 9.4 4.0 - 10.5 K/uL   RBC 3.31 (L) 3.87 - 5.11 MIL/uL   Hemoglobin 8.7 (L) 12.0 - 15.0 g/dL   HCT 28.3 (L) 36.0 - 46.0 %   MCV 85.5 80.0 - 100.0 fL   MCH 26.3 26.0 - 34.0 pg   MCHC 30.7 30.0 - 36.0 g/dL   RDW 13.6 11.5 - 15.5 %   Platelets 401 (H) 150 - 400 K/uL   nRBC 0.0 0.0 - 0.2 %    Comment: Performed at [facility], [address] [place], [place] [address]  CBC with Differential/Platelet     Status: Abnormal   Collection Time: 04/12/18  5:54 AM  Result Value Ref Range   WBC 9.1 4.0 - 10.5 K/uL   RBC 3.19 (L) 3.87 - 5.11 MIL/uL   Hemoglobin 8.3 (L) 12.0 - 15.0 g/dL   HCT 27.1 (L) 36.0 - 46.0 %   MCV 85.0 80.0 - 100.0 fL   MCH 26.0 26.0 - 34.0 pg   MCHC 30.6 30.0 - 36.0 g/dL   RDW 13.8 11.5 - 15.5 %   Platelets 392 150 - 400 K/uL   nRBC 0.0 0.0 - 0.2 %   Neutrophils Relative % 68 %   Neutro Abs 6.3 1.7 - 7.7 K/uL   Lymphocytes Relative 12 %   Lymphs Abs 1.1 0.7 - 4.0 K/uL   Monocytes Relative 13 %   Monocytes Absolute 1.2 (H) 0.1 - 1.0 K/uL   Eosinophils Relative 4 %   Eosinophils Absolute 0.4 0.0 - 0.5 K/uL   Basophils Relative 1 %   Basophils Absolute 0.1 0.0 - 0.1 K/uL   Immature Granulocytes 2 %   Abs Immature Granulocytes 0.16 (H) 0.00 - 0.07 K/uL    Comment: Performed at [facility], [address], [place],  [address]  Comprehensive metabolic panel     Status: Abnormal    Collection Time: 04/12/18  5:54 AM  Result Value Ref Range   Sodium 135 135 - 145 mmol/L   Potassium 3.8 3.5 - 5.1 mmol/L   Chloride 103 98 - 111 mmol/L   CO2 25 22 - 32 mmol/L   Glucose, Bld 104 (H) 70 - 99 mg/dL   BUN 12 8 - 23 mg/dL   Creatinine, Ser 0.50 0.44 - 1.00 mg/dL   Calcium 8.3 (L) 8.9 - 10.3 mg/dL   Total Protein 5.4 (L) 6.5 - 8.1 g/dL  Albumin 2.1 (L) 3.5 - 5.0 g/dL   AST 15 15 - 41 U/L   ALT 8 0 - 44 U/L   Alkaline Phosphatase 74 38 - 126 U/L   Total Bilirubin 0.2 (L) 0.3 - 1.2 mg/dL   GFR calc non Af Amer >60 >60 mL/min   GFR calc Af Amer >60 >60 mL/min   Anion gap 7 5 - 15    Comment: Performed at [facility], [address] [place], [place] [address]    ABGS No results for input(s): PHART, PO2ART, TCO2, HCO3 in the last 72 hours.  Invalid input(s): PCO2 CULTURES Recent Results (from the past 240 hour(s))  Culture, blood (routine x 2)     Status: None   Collection Time: 04/03/18 11:36 AM  Result Value Ref Range Status   Specimen Description BLOOD RIGHT ARM  Final   Special Requests   Final    BOTTLES DRAWN AEROBIC AND ANAEROBIC Blood Culture adequate volume   Culture   Final    NO GROWTH 5 DAYS Performed at [facility], [address] [place], [place] [address]    Report Status 04/08/2018 FINAL  Final  Culture, blood (routine x 2)     Status: None   Collection Time: 04/03/18 11:42 AM  Result Value Ref Range Status   Specimen Description BLOOD RIGHT HAND  Final   Special Requests   Final    BOTTLES DRAWN AEROBIC AND ANAEROBIC Blood Culture adequate volume   Culture   Final    NO GROWTH 5 DAYS Performed at [facility], [address], [place], [place] [address]    Report Status 04/08/2018 FINAL  Final  Surgical pcr screen     Status: None   Collection Time: 04/06/18  1:18 AM  Result Value Ref Range Status   MRSA, PCR NEGATIVE NEGATIVE Final   Staphylococcus aureus NEGATIVE NEGATIVE Final    Comment: (NOTE) The Xpert SA Assay (FDA approved for  NASAL specimens in patients 36 years of age and older), is one component of a comprehensive surveillance program. It is not intended to diagnose infection nor to guide or monitor treatment. Performed at [facility], [address], [place], [place] [address]   Urine culture     Status: Abnormal (Preliminary result)   Collection Time: 04/10/18  2:44 PM  Result Value Ref Range Status   Specimen Description   Final    URINE, CLEAN CATCH Performed at [facility], [address], [place], [place] [address]    Special Requests   Final    NONE Performed at [facility], [address], [place], [place] [address]    Culture (A)  Final    >=100,000 COLONIES/mL PSEUDOMONAS AERUGINOSA SUSCEPTIBILITIES TO FOLLOW Performed at [place] [address], [place], [place] [address]    Report Status PENDING  Incomplete   Studies/Results: Dg Chest 2 View  Result Date: 04/10/2018 CLINICAL DATA:  Dehydration.  Confusion. EXAM: CHEST - 2 VIEW COMPARISON:  04/05/2018 FINDINGS: Patient is rotated towards the right. Hiatal hernia is again seen. There are new bilateral patchy pulmonary infiltrates with volume loss in the lower lobes. Findings are worrisome for bilateral lower lobe pneumonia. Heart failure could have a similar appearance. IMPRESSION: New patchy infiltrates in the perihilar regions and lower lobes with volume loss. Pneumonia most likely. Some possibility this could be a manifestation of congestive heart failure. Electronically Signed   By: Nelson Chimes M.D.   On: 04/10/2018 15:27   Ct  Head Wo Contrast  Result Date: 04/10/2018 CLINICAL DATA:  Confusion/altered mental status EXAM: CT HEAD WITHOUT CONTRAST TECHNIQUE: Contiguous axial images were obtained from the base of the skull through the vertex without intravenous contrast. COMPARISON:  April 03, 2018 FINDINGS: Brain: There is stable diffuse atrophy. There is no intracranial mass, hemorrhage, extra-axial fluid collection, or midline  shift. There is patchy small vessel disease in the centra semiovale bilaterally, stable. There is small vessel disease in each external capsule, more on the left than on the right, stable. No acute appearing infarct is evident. Vascular: There is no hyperdense vessel. There is calcification in each distal vertebral artery and in the carotid siphon regions bilaterally. Skull: The bony calvarium appears intact. Sinuses/Orbits: There is mucosal thickening in several ethmoid air cells. There is also mild mucosal thickening in the inferior right maxillary antrum. Orbits appear symmetric bilaterally. Other: Mastoid air cells are clear. There is debris in the right external auditory canal. IMPRESSION: Stable atrophy with supratentorial small vessel disease. No acute infarct evident. No mass or hemorrhage. Foci of arterial vascular calcification noted. Areas of paranasal sinus disease noted. Probable cerumen in the right external auditory canal. Electronically Signed   By: Lowella Grip III M.D.   On: 04/10/2018 15:42    Medications:  Prior to Admission:  Medications Prior to Admission  Medication Sig Dispense Refill Last Dose  . albuterol (PROAIR HFA) 108 (90 Base) MCG/ACT inhaler Inhale 2 puffs into the lungs every 4 (four) hours as needed (4-6 hours).    Past Week at Unknown time  . albuterol (PROVENTIL) (2.5 MG/3ML) 0.083% nebulizer solution Take 2.5 mg by nebulization every 6 (six) hours as needed for wheezing or shortness of breath.   Past Week at Unknown time  . ALPRAZolam (XANAX) 1 MG tablet Take 0.5 tablets (0.5 mg total) by mouth 3 (three) times daily as needed for anxiety. (Patient taking differently: Take 1 mg by mouth 3 (three) times daily as needed for anxiety. ) 30 tablet 0 04/10/2018 at Unknown time  . cetirizine (ZYRTEC) 10 MG tablet Take 10 mg by mouth daily.   Past Week at Unknown time  . docusate sodium (COLACE) 100 MG capsule Take 1 capsule (100 mg total) by mouth 2 (two) times daily. 10  capsule 0 Past Week at Unknown time  . furosemide (LASIX) 40 MG tablet Take 40 mg by mouth daily.   Past Week at Unknown time  . pantoprazole (PROTONIX) 40 MG tablet TAKE (1) TABLET BY MOUTH TWICE A DAY WITH MEALS (BREAKFAST AND SUPPER) (Patient taking differently: Take 40 mg by mouth daily as needed. ) 60 tablet 3 Past Week at Unknown time  . potassium chloride (K-DUR) 10 MEQ tablet Take 10 mEq by mouth daily.   Past Week at Unknown time  . traMADol (ULTRAM) 50 MG tablet Take 100 mg by mouth every 4 (four) hours as needed for moderate pain or severe pain.    04/10/2018 at Unknown time  . umeclidinium-vilanterol (ANORO ELLIPTA) 62.5-25 MCG/INH AEPB Inhale 1 puff into the lungs daily.   Past Week at Unknown time  . VOLTAREN 1 % GEL Apply 1 g topically daily as needed. pain   Past Week at Unknown time  . zolpidem (AMBIEN) 5 MG tablet Take 5 mg by mouth at bedtime.    Past Week at Unknown time   Scheduled: . diclofenac sodium  4 g Topical QID  . enoxaparin (LOVENOX) injection  40 mg Subcutaneous Q24H  . umeclidinium-vilanterol  1  puff Inhalation Daily   Continuous: . cefTRIAXone (ROCEPHIN)  IV 1 g (04/11/18 2151)   XMI:[id number] **OR** acetaminophen, albuterol, ALPRAZolam, pantoprazole, traMADol  Assesment: She was admitted with altered mental status presumably related to urinary tract infection.  She has had urinary retention and has an indwelling Foley catheter.  She is growing Pseudomonas in the urine but sensitivities are not back yet.  She was dehydrated on admission and seems better so I am going to discontinue IV fluids.  She had very rapid decline when she went home so I am going to try to make sure that she is eating and drinking adequately.  She was doing well when she was discharged but became much worse very rapidly presumably from the urinary tract infection Principal Problem:   Altered mental status Active Problems:   Closed right hip fracture (HCC)   GERD (gastroesophageal  reflux disease)   COPD (chronic obstructive pulmonary disease) (HCC)   Urinary retention   Anxiety   Hyponatremia   UTI (urinary tract infection) due to urinary indwelling catheter ([place])   Urinary tract infection associated with catheterization of urinary tract (HCC)    Plan: Discontinue IV fluids.  Continue current antibiotic pending sensitivities.    LOS: 1 day   Alonza Bogus 04/12/2018, 9:03 AM

## 2018-04-12 NOTE — Care Management (Signed)
Pt active with [facility]. [place] Mall, [facility] rep, aware of admission.

## 2018-04-12 NOTE — Progress Notes (Signed)
Occupational Therapy Treatment Patient Details Name: [name] MRN: [id number] DOB: 01/06/1934 Today's Date: 04/12/2018    History of present illness [name] is a 84 y.o. female with medical history significant for COPD, GERD, osteoporosis, and anxiety who was recently admitted from 04/05/2018-04/09/2018 with a right hip fracture.  She underwent cannulated hip pinning on 04/06/2018.  It was recommended she be discharged to a skilled nursing facility however she refused and was discharged to home on 04/09/2018 with a Foley catheter in place due to urinary retention.  She returns to the ED today, 04/10/2018, with family reported increased confusion, generalized weakness, lethargy, and poor oral intake.  She denies any chest pain, dyspnea, abdominal pain, dysuria, nausea, vomiting, diarrhea, or peripheral edema.  She has not been out of bed due to her right hip pain.  She denies any loss of consciousness.   OT comments  Pt in bed with breakfast tray upon therapy arrival. Patient requested to sit up to eat breakfast as she had not been up at all yesterday. Patient required VC for safety due to environment and equipment management. Once sitting, patient required container to be opened on breakfast tray. She was able to cut and prepare her biscuit independently. Patient is progressing towards goals. Chair alarm on and call light within reach at end of session.            Precautions / Restrictions Precautions Precautions: Fall Restrictions Weight Bearing Restrictions: Yes RLE Weight Bearing: Weight bearing as tolerated       Mobility Bed Mobility Overal bed mobility: Needs Assistance Bed Mobility: Supine to Sit     Supine to sit: Min guard;HOB elevated        Transfers Overall transfer level: Needs assistance Equipment used: Rolling walker (2 wheeled) Transfers: Sit to/from [facility] Sit to Stand: Min assist Stand pivot transfers: Min assist                ADL  either performed or assessed with clinical judgement   ADL Overall ADL's : Needs assistance/impaired Eating/Feeding: Set up;Sitting               Upper Body Dressing : Minimal assistance;Sitting Upper Body Dressing Details (indicate cue type and reason): donning/doffing hospital gown                 Functional mobility during ADLs: Minimal assistance;Rolling walker;Cueing for sequencing;Cueing for safety                 Cognition Arousal/Alertness: Awake/alert Behavior During Therapy: WFL for tasks assessed/performed;Agitated Overall Cognitive Status: Within Functional Limits for tasks assessed                       Pertinent Vitals/ Pain       Pain Assessment: No/denies pain Pain Score: 0-No pain      Progress Toward Goals  OT Goals(current goals can now be found in the care plan section)  Progress towards OT goals: Progressing toward goals     Plan Discharge plan remains appropriate;Frequency remains appropriate       AM-PAC OT "6 Clicks" Daily Activity     Outcome Measure   Help from another person eating meals?: A Little Help from another person taking care of personal grooming?: A Little Help from another person toileting, which includes using toliet, bedpan, or urinal?: A Lot Help from another person bathing (including washing, rinsing, drying)?: A Lot Help from another person to put  on and taking off regular upper body clothing?: A Lot Help from another person to put on and taking off regular lower body clothing?: A Lot 6 Click Score: 14    End of Session Equipment Utilized During Treatment: Gait belt;Rolling walker  OT Visit Diagnosis: Muscle weakness (generalized) (M62.81)   Activity Tolerance Patient tolerated treatment well   Patient Left in chair;with call bell/phone within reach;with chair alarm set   Nurse Communication Other (comment)(Nurse Tech made aware of broken brakes on recliner. Recliner was switched out prior to  transfer for a properly working one. )        Time: 7915-0569 OT Time Calculation (min): 22 min  Charges: OT General Charges $OT Visit: 1 Visit OT Treatments $Self Care/Home Management : 8-22 mins  Ailene Ravel, OTR/L,CBIS  [phone]    Nadalee Neiswender, Clarene Duke 04/12/2018, 8:38 AM

## 2018-04-12 NOTE — Clinical Social Work Note (Signed)
Patient lives with her grandson and granddaughter. She was adamant that she did not want her family contacted about her discharge plan. Patient stated that her family would want her to go to SNF and she has been hospitalized and away from home long enough.  She stated that she is independent at baseline and ambulates with a two wheeled rolling walker.  She also has a bedside commode, shower chair and hospital bed in the home.  Patient is active with [facility] home [facility]. She stated that she plans on returning home at discharge and resuming her [facility] services with PT and OT.  Attending advised of patient's plan.  Santiago Glad at [employer] was advised of patient's plan.    Marvell Stavola, Clydene Pugh, LCSW

## 2018-04-12 NOTE — Progress Notes (Signed)
Physical Therapy Treatment Patient Details Name: [name] MRN: [id number] DOB: 03-10-1934 Today's Date: 04/12/2018    History of Present Illness [name] is a 84 y.o. female with medical history significant for COPD, GERD, osteoporosis, and anxiety who was recently admitted from 04/05/2018-04/09/2018 with a right hip fracture.  She underwent cannulated hip pinning on 04/06/2018.  It was recommended she be discharged to a skilled nursing facility however she refused and was discharged to home on 04/09/2018 with a Foley catheter in place due to urinary retention.  She returns to the ED today, 04/10/2018, with family reported increased confusion, generalized weakness, lethargy, and poor oral intake.  She denies any chest pain, dyspnea, abdominal pain, dysuria, nausea, vomiting, diarrhea, or peripheral edema.  She has not been out of bed due to her right hip pain.  She denies any loss of consciousness.    PT Comments    Pt sitting in chair and willing to particpate.  Pt limited by LBP, RN notified for assistance and applied ointment on lower back for pain control. Pt making vast improvements with decreased assistance required for mobility and able to increase distance with gait.  Pt continues to require cueing for assistance including hand placement and to scoot to edge of chair for increased ease with STS, slow labored movements.  Min A with transfer training and able to ambulate 60 ft with RW, slow movements no LOB episodes.  EOS pt left in chair with call bell wihtin reach and chair alarm set.  No reports of pain through session.     Follow Up Recommendations  SNF;Supervision/Assistance - 24 hour;Supervision for mobility/OOB     Equipment Recommendations  None recommended by PT    Recommendations for Other Services       Precautions / Restrictions Precautions Precautions: Fall Restrictions Weight Bearing Restrictions: Yes RLE Weight Bearing: Weight bearing as tolerated    Mobility  Bed  Mobility Overal bed mobility: Needs Assistance Bed Mobility: Supine to Sit     Supine to sit: Min guard;HOB elevated     General bed mobility comments: pt sitting in chair upon therapist entrance  Transfers Overall transfer level: Modified independent Equipment used: Ambulation equipment used Transfers: Sit to/from Stand Sit to Stand: Min assist Stand pivot transfers: Min assist       General transfer comment: cueing for hand placement to assist wiht scooting to edge of chair and STS, increased time to complete.  labored movement  Ambulation/Gait Ambulation/Gait assistance: Min assist Gait Distance (Feet): 60 Feet Assistive device: Rolling walker (2 wheeled) Gait Pattern/deviations: Decreased step length - right;Decreased step length - left;Decreased stride length Gait velocity: decreased   General Gait Details: limited by fatigue   Stairs             Wheelchair Mobility    Modified Rankin (Stroke Patients Only)       Balance                                            Cognition Arousal/Alertness: Awake/alert Behavior During Therapy: WFL for tasks assessed/performed Overall Cognitive Status: Within Functional Limits for tasks assessed Area of Impairment: (Able to recall name, DOB, aware of current location)                       Following Commands: Follows multi-step commands inconsistently  General Comments: requires increased time to follow directions      Exercises General Exercises - Lower Extremity Long Arc Quad: AROM;Both;10 reps;Seated Hip Flexion/Marching: AROM;Strengthening;Right;Left;10 reps Toe Raises: AROM;Strengthening;Both;Seated;10 reps Heel Raises: AROM;Strengthening;Both;10 reps;Seated    General Comments        Pertinent Vitals/Pain Pain Assessment: 0-10 Pain Score: 4  Pain Location: c/o LBP, ointment applied by RN prior gait Pain Descriptors / Indicators: Grimacing;Guarding Pain  Intervention(s): Patient requesting pain meds-RN notified;Premedicated before session;Monitored during session;Limited activity within patient's tolerance(RN applied ointment on LB for pain control prior tx)    Home Living                      Prior Function            PT Goals (current goals can now be found in the care plan section)      Frequency    Min 4X/week      PT Plan Current plan remains appropriate    Co-evaluation              AM-PAC PT "6 Clicks" Mobility   Outcome Measure  Help needed turning from your back to your side while in a flat bed without using bedrails?: A Lot Help needed moving from lying on your back to sitting on the side of a flat bed without using bedrails?: A Lot Help needed moving to and from a bed to a chair (including a wheelchair)?: A Little Help needed standing up from a chair using your arms (e.g., wheelchair or bedside chair)?: A Little Help needed to walk in hospital room?: A Little Help needed climbing 3-5 steps with a railing? : A Lot 6 Click Score: 15    End of Session Equipment Utilized During Treatment: Gait belt Activity Tolerance: Patient tolerated treatment well;Patient limited by fatigue Patient left: in chair;with call bell/phone within reach;with chair alarm set(RN aware of status) Nurse Communication: Mobility status PT Visit Diagnosis: Unsteadiness on feet (R26.81);Other abnormalities of gait and mobility (R26.89);Muscle weakness (generalized) (M62.81)     Time: 1610-9604 PT Time Calculation (min) (ACUTE ONLY): 42 min  Charges:  $Gait Training: 8-22 mins $Therapeutic Exercise: 8-22 mins $Therapeutic Activity: 8-22 mins                     [address], LPTA; [place]  Aldona Lento 04/12/2018, 9:44 AM

## 2018-04-13 LAB — URINE CULTURE: Culture: >=100000 — AB

## 2018-04-13 MED ORDER — CEFEPIME IV (FOR PTA / DISCHARGE USE ONLY): 2.0000 g | Freq: Two times a day (BID) | INTRAVENOUS | 0 refills | Status: DC

## 2018-04-13 MED ORDER — SODIUM CHLORIDE 0.9 % IV SOLN
2.0000 g | Freq: Two times a day (BID) | INTRAVENOUS | Status: DC
  Administered 2018-04-13 – 2018-04-15 (×5): 2 g via INTRAVENOUS
  Filled 2018-04-13 (×5): qty 2

## 2018-04-13 NOTE — Progress Notes (Signed)
Occupational Therapy Treatment Patient Details Name: [name] MRN: [id number] DOB: August 28, 1934 Today's Date: 04/13/2018    History of present illness [name] is a 84 y.o. female with medical history significant for COPD, GERD, osteoporosis, and anxiety who was recently admitted from 04/05/2018-04/09/2018 with a right hip fracture.  She underwent cannulated hip pinning on 04/06/2018.  It was recommended she be discharged to a skilled nursing facility however she refused and was discharged to home on 04/09/2018 with a Foley catheter in place due to urinary retention.  She returns to the ED today, 04/10/2018, with family reported increased confusion, generalized weakness, lethargy, and poor oral intake.  She denies any chest pain, dyspnea, abdominal pain, dysuria, nausea, vomiting, diarrhea, or peripheral edema.  She has not been out of bed due to her right hip pain.  She denies any loss of consciousness.   OT comments  Pt with much improvement in cognition today, able to follow directions without difficulty. Pt complaining of hand and elbow pain this am, discussed importance of moving around, stretching, and completing exercises to prevent joint pain and stiffness. Pt reports completing toileting earlier this am, transferred to chair to completing sitting ADLs. Educated pt on UB exercises focusing on ROM and strength required for UB assist during mobility tasks. Pt is progressing towards goals, discharge to SNF remains appropriate although pt reporting she prefers to go home.    Follow Up Recommendations  SNF    Equipment Recommendations  None recommended by OT       Precautions / Restrictions Precautions Precautions: Fall Restrictions Weight Bearing Restrictions: Yes RLE Weight Bearing: Weight bearing as tolerated       Mobility Bed Mobility Overal bed mobility: Needs Assistance Bed Mobility: Supine to Sit     Supine to sit: Min assist;HOB elevated     General bed mobility comments:  Assist to push up from elbows into sitting  Transfers Overall transfer level: Needs assistance Equipment used: Rolling walker (2 wheeled) Transfers: Sit to/from [facility] Sit to Stand: Min assist;Mod assist Stand pivot transfers: Min assist       General transfer comment: cueing for hand placement to assist wiht scooting to edge of chair and STS, increased time to complete.  labored movement        ADL either performed or assessed with clinical judgement   ADL Overall ADL's : Needs assistance/impaired Eating/Feeding: Set up;Sitting Eating/Feeding Details (indicate cue type and reason): Pt required assist with opening sugar and coffee. Able to mix salt in eggs and feed herself without difficulty.  Grooming: Set up;Sitting Grooming Details (indicate cue type and reason): pt washed face with set-up                 Toilet Transfer: Min Geophysical data processor Details (indicate cue type and reason): simulated with bed to chair transfer         Functional mobility during ADLs: Minimal assistance;Rolling walker;Cueing for sequencing;Cueing for safety General ADL Comments: Pt refusing to atttempt ADLs at sink, declined toileting               Cognition Arousal/Alertness: Awake/alert Behavior During Therapy: WFL for tasks assessed/performed Overall Cognitive Status: Within Functional Limits for tasks assessed                                          Exercises Exercises: General Upper Extremity;Other  exercises General Exercises - Upper Extremity Shoulder Flexion: AROM;10 reps Shoulder Extension: AROM;10 reps Shoulder Horizontal ABduction: AROM;10 reps Shoulder Horizontal ADduction: AROM;10 reps Elbow Flexion: AROM;10 reps Elbow Extension: AROM;10 reps Digit Composite Flexion: AROM;10 reps Composite Extension: AROM;10 reps Other Exercises Other Exercises: shoulder protraction, A/ROM, 10X           Pertinent  Vitals/ Pain       Pain Assessment: No/denies pain     Prior Functioning/Environment              Frequency  Min 2X/week        Progress Toward Goals  OT Goals(current goals can now be found in the care plan section)  Progress towards OT goals: Progressing toward goals  Acute Rehab OT Goals Patient Stated Goal: return home with family to assist OT Goal Formulation: With patient Time For Goal Achievement: 04/25/18 Potential to Achieve Goals: Good ADL Goals Pt Will Perform Eating: with modified independence;sitting Pt Will Perform Grooming: with set-up;sitting Pt Will Perform Upper Body Dressing: with modified independence;sitting Pt Will Transfer to Toilet: with min guard assist;stand pivot transfer;ambulating;bedside commode;regular height toilet Pt Will Perform Toileting - Clothing Manipulation and hygiene: with min guard assist;sitting/lateral leans;sit to/from stand Pt/caregiver will Perform Home Exercise Program: Increased strength;Both right and left upper extremity;Independently;With written HEP provided  Plan Discharge plan remains appropriate;Frequency remains appropriate          End of Session Equipment Utilized During Treatment: Gait belt;Rolling walker  OT Visit Diagnosis: Muscle weakness (generalized) (M62.81) Pain - Right/Left: Right Pain - part of body: Hip   Activity Tolerance Patient tolerated treatment well   Patient Left in chair;with call bell/phone within reach;with chair alarm set   Nurse Communication          Time: [phone] OT Time Calculation (min): 31 min  Charges: OT General Charges $OT Visit: 1 Visit OT Treatments $Self Care/Home Management : 8-22 mins $Therapeutic Exercise: 8-22 mins   Guadelupe Sabin, OTR/L  [phone] 04/13/2018, 8:27 AM

## 2018-04-13 NOTE — Progress Notes (Signed)
PHARMACY CONSULT NOTE FOR:  OUTPATIENT  PARENTERAL ANTIBIOTIC THERAPY (OPAT)  Indication: UTI  Regimen: Cefepime 2gm iv q12h  End date: 04/17/18  IV antibiotic discharge orders are pended. To discharging provider:  please sign these orders via discharge navigator,  Select New Orders & click on the button choice - Manage This Unsigned Work.     Thank you for allowing pharmacy to be a part of this patient's care.  [name] 04/13/2018, 2:42 PM

## 2018-04-13 NOTE — Clinical Social Work Note (Signed)
Patient's daughter, Kalman Shan, is agreeable to do the IV antibiotics.    Roena Malady at [facility] is agreeable to do the IV infusion.  Santiago Glad at [facility] was advised of patient's need for IV antibiotics.

## 2018-04-13 NOTE — Progress Notes (Signed)
Subjective: She says she feels okay.  No new complaints.  She is still weak.  She is working with physical therapy.  Her Pseudomonas in her urine sensitivities have returned.  Discussed with Donna Christen from pharmacy and plans are to start her on cefepime.  Unfortunately it does not look like there is anything orally.  Objective: Vital signs in last 24 hours: Temp:  [97.6 F (36.4 C)-98.3 F (36.8 C)] 97.6 F (36.4 C) (04/08 2142) Pulse Rate:  [70-82] 70 (04/09 0506) Resp:  [17-20] 20 (04/09 0506) BP: (104-117)/(62-80) 117/70 (04/09 0506) SpO2:  [93 %-100 %] 93 % (04/09 0758) Weight:  [54.7 kg] 54.7 kg (04/09 0500) Weight change: -0.1 kg Last BM Date: 04/12/18  Intake/Output from previous day: 04/08 0701 - 04/09 0700 In: 840 [P.O.:840] Out: 1550 [Urine:1550]  PHYSICAL EXAM General appearance: alert, cooperative and no distress Resp: clear to auscultation bilaterally Cardio: regular rate and rhythm, S1, S2 normal, no murmur, click, rub or gallop GI: soft, non-tender; bowel sounds normal; no masses,  no organomegaly Extremities: extremities normal, atraumatic, no cyanosis or edema  Lab Results:  Results for orders placed or performed during the hospital encounter of 04/10/18 (from the past 48 hour(s))  CBC with Differential/Platelet     Status: Abnormal   Collection Time: 04/12/18  5:54 AM  Result Value Ref Range   WBC 9.1 4.0 - 10.5 K/uL   RBC 3.19 (L) 3.87 - 5.11 MIL/uL   Hemoglobin 8.3 (L) 12.0 - 15.0 g/dL   HCT 27.1 (L) 36.0 - 46.0 %   MCV 85.0 80.0 - 100.0 fL   MCH 26.0 26.0 - 34.0 pg   MCHC 30.6 30.0 - 36.0 g/dL   RDW 13.8 11.5 - 15.5 %   Platelets 392 150 - 400 K/uL   nRBC 0.0 0.0 - 0.2 %   Neutrophils Relative % 68 %   Neutro Abs 6.3 1.7 - 7.7 K/uL   Lymphocytes Relative 12 %   Lymphs Abs 1.1 0.7 - 4.0 K/uL   Monocytes Relative 13 %   Monocytes Absolute 1.2 (H) 0.1 - 1.0 K/uL   Eosinophils Relative 4 %   Eosinophils Absolute 0.4 0.0 - 0.5 K/uL   Basophils  Relative 1 %   Basophils Absolute 0.1 0.0 - 0.1 K/uL   Immature Granulocytes 2 %   Abs Immature Granulocytes 0.16 (H) 0.00 - 0.07 K/uL    Comment: Performed at [facility], [address], [place], [place] [address]  Comprehensive metabolic panel     Status: Abnormal   Collection Time: 04/12/18  5:54 AM  Result Value Ref Range   Sodium 135 135 - 145 mmol/L   Potassium 3.8 3.5 - 5.1 mmol/L   Chloride 103 98 - 111 mmol/L   CO2 25 22 - 32 mmol/L   Glucose, Bld 104 (H) 70 - 99 mg/dL   BUN 12 8 - 23 mg/dL   Creatinine, Ser 0.50 0.44 - 1.00 mg/dL   Calcium 8.3 (L) 8.9 - 10.3 mg/dL   Total Protein 5.4 (L) 6.5 - 8.1 g/dL   Albumin 2.1 (L) 3.5 - 5.0 g/dL   AST 15 15 - 41 U/L   ALT 8 0 - 44 U/L   Alkaline Phosphatase 74 38 - 126 U/L   Total Bilirubin 0.2 (L) 0.3 - 1.2 mg/dL   GFR calc non Af Amer >60 >60 mL/min   GFR calc Af Amer >60 >60 mL/min   Anion gap 7 5 - 15    Comment: Performed  at [facility], [address], [place], [place] [address]    ABGS No results for input(s): PHART, PO2ART, TCO2, HCO3 in the last 72 hours.  Invalid input(s): PCO2 CULTURES Recent Results (from the past 240 hour(s))  Culture, blood (routine x 2)     Status: None   Collection Time: 04/03/18 11:36 AM  Result Value Ref Range Status   Specimen Description BLOOD RIGHT ARM  Final   Special Requests   Final    BOTTLES DRAWN AEROBIC AND ANAEROBIC Blood Culture adequate volume   Culture   Final    NO GROWTH 5 DAYS Performed at [facility], [address] [place], [place] [address]    Report Status 04/08/2018 FINAL  Final  Culture, blood (routine x 2)     Status: None   Collection Time: 04/03/18 11:42 AM  Result Value Ref Range Status   Specimen Description BLOOD RIGHT HAND  Final   Special Requests   Final    BOTTLES DRAWN AEROBIC AND ANAEROBIC Blood Culture adequate volume   Culture   Final    NO GROWTH 5 DAYS Performed at [facility], [address] [place], [place] [address]    Report Status  04/08/2018 FINAL  Final  Surgical pcr screen     Status: None   Collection Time: 04/06/18  1:18 AM  Result Value Ref Range Status   MRSA, PCR NEGATIVE NEGATIVE Final   Staphylococcus aureus NEGATIVE NEGATIVE Final    Comment: (NOTE) The Xpert SA Assay (FDA approved for NASAL specimens in patients 63 years of age and older), is one component of a comprehensive surveillance program. It is not intended to diagnose infection nor to guide or monitor treatment. Performed at [facility], [address] [place], [place] [address]   Urine culture     Status: Abnormal   Collection Time: 04/10/18  2:44 PM  Result Value Ref Range Status   Specimen Description   Final    URINE, CLEAN CATCH Performed at [facility], [address] [place], [place] [address]    Special Requests   Final    NONE Performed at [facility], [address], [place], [place] [address]    Culture >=100,000 COLONIES/mL PSEUDOMONAS AERUGINOSA (A)  Final   Report Status 04/13/2018 FINAL  Final   Organism ID, Bacteria PSEUDOMONAS AERUGINOSA (A)  Final      Susceptibility   Pseudomonas aeruginosa - MIC*    CEFTAZIDIME <=1 SENSITIVE Sensitive     CIPROFLOXACIN <=0.25 SENSITIVE Sensitive     GENTAMICIN <=1 SENSITIVE Sensitive     IMIPENEM 2 SENSITIVE Sensitive     PIP/TAZO <=4 SENSITIVE Sensitive     CEFEPIME <=1 SENSITIVE Sensitive     * >=100,000 COLONIES/mL PSEUDOMONAS AERUGINOSA   Studies/Results: No results found.  Medications:  Prior to Admission:  Medications Prior to Admission  Medication Sig Dispense Refill Last Dose  . albuterol (PROAIR HFA) 108 (90 Base) MCG/ACT inhaler Inhale 2 puffs into the lungs every 4 (four) hours as needed (4-6 hours).    Past Week at Unknown time  . albuterol (PROVENTIL) (2.5 MG/3ML) 0.083% nebulizer solution Take 2.5 mg by nebulization every 6 (six) hours as needed for wheezing or shortness of breath.   Past Week at Unknown time  . ALPRAZolam (XANAX) 1 MG tablet Take 0.5 tablets  (0.5 mg total) by mouth 3 (three) times daily as needed for anxiety. (Patient taking differently: Take 1 mg by mouth 3 (three) times daily as needed for  anxiety. ) 30 tablet 0 04/10/2018 at Unknown time  . cetirizine (ZYRTEC) 10 MG tablet Take 10 mg by mouth daily.   Past Week at Unknown time  . docusate sodium (COLACE) 100 MG capsule Take 1 capsule (100 mg total) by mouth 2 (two) times daily. 10 capsule 0 Past Week at Unknown time  . furosemide (LASIX) 40 MG tablet Take 40 mg by mouth daily.   Past Week at Unknown time  . pantoprazole (PROTONIX) 40 MG tablet TAKE (1) TABLET BY MOUTH TWICE A DAY WITH MEALS (BREAKFAST AND SUPPER) (Patient taking differently: Take 40 mg by mouth daily as needed. ) 60 tablet 3 Past Week at Unknown time  . potassium chloride (K-DUR) 10 MEQ tablet Take 10 mEq by mouth daily.   Past Week at Unknown time  . traMADol (ULTRAM) 50 MG tablet Take 100 mg by mouth every 4 (four) hours as needed for moderate pain or severe pain.    04/10/2018 at Unknown time  . umeclidinium-vilanterol (ANORO ELLIPTA) 62.5-25 MCG/INH AEPB Inhale 1 puff into the lungs daily.   Past Week at Unknown time  . VOLTAREN 1 % GEL Apply 1 g topically daily as needed. pain   Past Week at Unknown time  . zolpidem (AMBIEN) 5 MG tablet Take 5 mg by mouth at bedtime.    Past Week at Unknown time   Scheduled: . diclofenac sodium  4 g Topical QID  . enoxaparin (LOVENOX) injection  40 mg Subcutaneous Q24H  . umeclidinium-vilanterol  1 puff Inhalation Daily   Continuous: . ceFEPime (MAXIPIME) IV     WPY:[id number] **OR** acetaminophen, albuterol, ALPRAZolam, pantoprazole, traMADol  Assesment: She was admitted with altered mental status related to a urinary tract infection.  She has had an indwelling Foley catheter for about 2 weeks because of urinary retention which is an ongoing problem.  She is growing Pseudomonas in her urine.  It appears that this is only going to be able to be treated by IV route considering  her allergies.  She is being switched to cefepime this morning.  She will need 5 days.  I discussed with her daughter Kalman Shan and she is concerned that she is not going to be able to take care of an IV at home.  She has COPD which is stable.  She has what appears to be a patch of pneumonia and should be covered well by cefepime. Principal Problem:   Altered mental status Active Problems:   Closed right hip fracture (HCC)   GERD (gastroesophageal reflux disease)   COPD (chronic obstructive pulmonary disease) (HCC)   Urinary retention   Anxiety   Hyponatremia   UTI (urinary tract infection) due to urinary indwelling catheter ([place])   Urinary tract infection associated with catheterization of urinary tract (HCC)    Plan: Switch to IV cefepime.  I am not sure if this is going to be able to be finished up at home or if she is going to need to be in the hospital for the entire 5 days.  Will discuss with case management    LOS: 2 days   Alonza Bogus 04/13/2018, 8:27 AM

## 2018-04-13 NOTE — Progress Notes (Signed)
Physical Therapy Treatment Patient Details Name: [name] MRN: [id number] DOB: 1934-06-24 Today's Date: 04/13/2018    History of Present Illness [name] is a 84 y.o. female with medical history significant for COPD, GERD, osteoporosis, and anxiety who was recently admitted from 04/05/2018-04/09/2018 with a right hip fracture.  She underwent cannulated hip pinning on 04/06/2018.  It was recommended she be discharged to a skilled nursing facility however she refused and was discharged to home on 04/09/2018 with a Foley catheter in place due to urinary retention.  She returns to the ED today, 04/10/2018, with family reported increased confusion, generalized weakness, lethargy, and poor oral intake.  She denies any chest pain, dyspnea, abdominal pain, dysuria, nausea, vomiting, diarrhea, or peripheral edema.  She has not been out of bed due to her right hip pain.  She denies any loss of consciousness.    PT Comments    Pt sitting in chair upon therapist entrance and willing to participate.  No reports of pain today.  Pt does report some concern with edema present Rt hand.  Pt progressing well towards therapy with min A with transfer and gait training.  Able to increase distance with gait today.  Pt required mulimodal cueing for proper hand placement to assist with scooting to edge of chair and proper placement to assist with standing.  Pt with tendency to drift during gait requiring cueing to improve mechanics.  EOS pt left in chair with call bell wihtin reach and chair alarm set.  RN aware of status.  No reports of pain through session, was limited by fatigue with activity.      Follow Up Recommendations  SNF;Supervision/Assistance - 24 hour;Supervision for mobility/OOB     Equipment Recommendations  None recommended by PT    Recommendations for Other Services       Precautions / Restrictions Precautions Precautions: Fall Restrictions Weight Bearing Restrictions: Yes RLE Weight Bearing:  Weight bearing as tolerated    Mobility  Bed Mobility Overal bed mobility: Needs Assistance Bed Mobility: Supine to Sit     Supine to sit: Min assist;HOB elevated     General bed mobility comments: Pt sitting in chair upon therapist entrance  Transfers Overall transfer level: Modified independent Equipment used: Rolling walker (2 wheeled) Transfers: Sit to/from Stand Sit to Stand: Min assist Stand pivot transfers: Min assist       General transfer comment: cueing for hand placement to assist wiht scooting to edge of chair and STS, increased time to complete.  labored movement  Ambulation/Gait Ambulation/Gait assistance: Min assist Gait Distance (Feet): 85 Feet Assistive device: Rolling walker (2 wheeled) Gait Pattern/deviations: Decreased step length - right;Decreased step length - left;Decreased stride length Gait velocity: decreased   General Gait Details: limited by fatigue   Stairs             Wheelchair Mobility    Modified Rankin (Stroke Patients Only)       Balance                                            Cognition Arousal/Alertness: Awake/alert Behavior During Therapy: WFL for tasks assessed/performed Overall Cognitive Status: Within Functional Limits for tasks assessed  Exercises Total Joint Exercises Ankle Circles/Pumps: Both;10 reps Long Arc Quad: Both;10 reps Marching in Standing: Both;10 reps;Seated General Exercises - Upper Extremity Shoulder Flexion: AROM;10 reps Shoulder Extension: AROM;10 reps Shoulder Horizontal ABduction: AROM;10 reps Shoulder Horizontal ADduction: AROM;10 reps Elbow Flexion: AROM;10 reps Elbow Extension: AROM;10 reps Digit Composite Flexion: AROM;10 reps Composite Extension: AROM;10 reps Other Exercises Other Exercises: shoulder protraction, A/ROM, 10X    General Comments        Pertinent Vitals/Pain Pain Assessment: No/denies  pain    Home Living                      Prior Function            PT Goals (current goals can now be found in the care plan section) Acute Rehab PT Goals Patient Stated Goal: return home with family to assist    Frequency    Min 4X/week      PT Plan Current plan remains appropriate    Co-evaluation              AM-PAC PT "6 Clicks" Mobility   Outcome Measure  Help needed turning from your back to your side while in a flat bed without using bedrails?: A Lot Help needed moving from lying on your back to sitting on the side of a flat bed without using bedrails?: A Lot Help needed moving to and from a bed to a chair (including a wheelchair)?: A Little Help needed standing up from a chair using your arms (e.g., wheelchair or bedside chair)?: A Little Help needed to walk in hospital room?: A Little Help needed climbing 3-5 steps with a railing? : A Lot 6 Click Score: 15    End of Session Equipment Utilized During Treatment: Gait belt Activity Tolerance: Patient tolerated treatment well;Patient limited by fatigue Patient left: in chair;with call bell/phone within reach;with chair alarm set Nurse Communication: Mobility status PT Visit Diagnosis: Unsteadiness on feet (R26.81);Other abnormalities of gait and mobility (R26.89);Muscle weakness (generalized) (M62.81)     Time: 5009-3818 PT Time Calculation (min) (ACUTE ONLY): 27 min  Charges:  $Gait Training: 8-22 mins $Therapeutic Exercise: 8-22 mins                     [address], LPTA; [place]  Aldona Lento 04/13/2018, 8:56 AM

## 2018-04-14 MED ORDER — SODIUM CHLORIDE 0.9% FLUSH
10.0000 mL | Freq: Two times a day (BID) | INTRAVENOUS | Status: DC
  Administered 2018-04-14: 10 mL

## 2018-04-14 MED ORDER — CEFEPIME IV (FOR PTA / DISCHARGE USE ONLY): 2.0000 g | Freq: Two times a day (BID) | INTRAVENOUS | 0 refills | Status: DC

## 2018-04-14 MED ORDER — SODIUM CHLORIDE 0.9% FLUSH: 10.0000 mL | INTRAVENOUS | Status: DC | PRN

## 2018-04-14 NOTE — Discharge Summary (Signed)
Physician Discharge Summary  Patient ID: [name] MRN: [id number] DOB/AGE: [id number] y.o. Primary Care Physician:Zafar Debrosse, Percell Miller, MD Admit date: 04/10/2018 Discharge date: 04/14/2018    Discharge Diagnoses:   Principal Problem:   Altered mental status Active Problems:   Closed right hip fracture (HCC)   GERD (gastroesophageal reflux disease)   COPD (chronic obstructive pulmonary disease) (HCC)   Urinary retention   Anxiety   Hyponatremia   UTI (urinary tract infection) due to urinary indwelling catheter (HCC)   Urinary tract infection associated with catheterization of urinary tract (HCC) Possible pneumonia Dehydration Allergies as of 04/14/2018      Reactions   Codeine Shortness Of Breath, Rash   Codeine Anaphylaxis   Levofloxacin Anaphylaxis   Patient was admitted to hospital with lung problems due to this medication   Sulfonamide Derivatives Hives, Shortness Of Breath   Sulfa Antibiotics Hives      Medication List    TAKE these medications   albuterol (2.5 MG/3ML) 0.083% nebulizer solution Commonly known as:  PROVENTIL Take 2.5 mg by nebulization every 6 (six) hours as needed for wheezing or shortness of breath.   ProAir HFA 108 (90 Base) MCG/ACT inhaler Generic drug:  albuterol Inhale 2 puffs into the lungs every 4 (four) hours as needed (4-6 hours).   ALPRAZolam 1 MG tablet Commonly known as:  XANAX Take 0.5 tablets (0.5 mg total) by mouth 3 (three) times daily as needed for anxiety. What changed:  how much to take   ceFEPime  IVPB Commonly known as:  MAXIPIME Inject 2 g into the vein every 12 (twelve) hours. Indication:  UTI Last Day of Therapy:  4/13 Labs - Once weekly:  CBC/D and BMP, Labs - Every other week:  ESR and CRP   ceFEPime  IVPB Commonly known as:  MAXIPIME Inject 2 g into the vein every 12 (twelve) hours. Indication:  UTI Last Day of Therapy:  4/13 Labs - Once weekly:  CBC/D and BMP, Labs - Every other week:  ESR and CRP    cetirizine 10 MG tablet Commonly known as:  ZYRTEC Take 10 mg by mouth daily.   docusate sodium 100 MG capsule Commonly known as:  COLACE Take 1 capsule (100 mg total) by mouth 2 (two) times daily.   furosemide 40 MG tablet Commonly known as:  LASIX Take 40 mg by mouth daily.   pantoprazole 40 MG tablet Commonly known as:  PROTONIX TAKE (1) TABLET BY MOUTH TWICE A DAY WITH MEALS (BREAKFAST AND SUPPER) What changed:  See the new instructions.   potassium chloride 10 MEQ tablet Commonly known as:  K-DUR Take 10 mEq by mouth daily.   traMADol 50 MG tablet Commonly known as:  ULTRAM Take 100 mg by mouth every 4 (four) hours as needed for moderate pain or severe pain.   umeclidinium-vilanterol 62.5-25 MCG/INH Aepb Commonly known as:  ANORO ELLIPTA Inhale 1 puff into the lungs daily.   Voltaren 1 % Gel Generic drug:  diclofenac sodium Apply 1 g topically daily as needed. pain   zolpidem 5 MG tablet Commonly known as:  AMBIEN Take 5 mg by mouth at bedtime.            Home Infusion Instuctions  (From admission, onward)         Start     Ordered   04/14/18 0000  Home infusion instructions [facility] May follow [place] Dosing Protocol; May administer Cathflo as needed to maintain patency of vascular access  device.; Flushing of vascular access device: per [facility] Protocol: 0.9% NaCl pre/post medica...    Question Answer Comment  Instructions May follow [place] Dosing Protocol   Instructions May administer Cathflo as needed to maintain patency of vascular access device.   Instructions Flushing of vascular access device: per [facility] Protocol: 0.9% NaCl pre/post medication administration and prn patency; Heparin 100 u/ml, 68m for implanted ports and Heparin 10u/ml, 5260mfor all other central venous catheters.   Instructions May follow [facility] Anaphylaxis Protocol for First Dose Administration in the home: 0.9% NaCl at 25-50 ml/hr to maintain IV access for protocol meds.  Epinephrine 0.3 ml IV/IM PRN and Benadryl 25-50 IV/IM PRN s/s of anaphylaxis.   Instructions [facility] Infusion Coordinator (RN) to assist per patient IV care needs in the home PRN.      04/14/18 0841   04/13/18 0000  Home infusion instructions [facility] May follow [facility] Protocol; May administer Cathflo as needed to maintain patency of vascular access device.; Flushing of vascular access device: [facility]: 0.9% NaCl pre/post medica...    Question Answer Comment  Instructions May follow [facility]osing Protocol   Instructions May administer Cathflo as needed to maintain patency of vascular access device.   Instructions Flushing of vascular access device: per [facility]: 0.9% NaCl pre/post medication administration and prn patency; Heparin 100 u/ml, 60m76mor implanted ports and Heparin 10u/ml, 60ml6mr all other central venous catheters.   Instructions May follow [facility] Anaphylaxis Protocol for First Dose Administration in the home: 0.9% NaCl at 25-50 ml/hr to maintain IV access for protocol meds. Epinephrine 0.3 ml IV/IM PRN and Benadryl 25-50 IV/IM PRN s/s of anaphylaxis.   Instructions [facility] Infusion Coordinator (RN) to assist per patient IV care needs in the home PRN.      04/13/18 1439          Discharged Condition: Improved    Consults: None  Significant Diagnostic Studies: Dg Chest 2 View  Result Date: 04/10/2018 CLINICAL DATA:  Dehydration.  Confusion. EXAM: CHEST - 2 VIEW COMPARISON:  04/05/2018 FINDINGS: Patient is rotated towards the right. Hiatal hernia is again seen. There are new bilateral patchy pulmonary infiltrates with volume loss in the lower lobes. Findings are worrisome for bilateral lower lobe pneumonia. Heart failure could have a similar appearance. IMPRESSION: New patchy infiltrates in the perihilar regions and lower lobes with volume loss. Pneumonia most likely. Some possibility this could be a manifestation of  congestive heart failure. Electronically Signed   By: MarkNelson Chimes.   On: 04/10/2018 15:27   Dg Lumbar Spine Complete  Result Date: 04/05/2018 CLINICAL DATA:  83 y61r old female with right hip and lower back pain after falling at home earlier today EXAM: LUMBAR SPINE - COMPLETE 4+ VIEW COMPARISON:  Prior CT scan of the abdomen and pelvis 04/03/2018 FINDINGS: Rotary and dextroconvex scoliosis centered at L3. The bones are diffusely osteopenic in appearance. Radiographs are extremely limited secondary to low bone density. Visualized bowel gas pattern is unremarkable. IMPRESSION: The bones are significantly demineralized which severely limits the diagnostic accuracy of this examination, particularly in light of underlying scoliosis. If there is persistent clinical concern for lumbar spinal fracture, recommend further evaluation with MRI. Rotary and dextroconvex scoliosis centered at L3. Electronically Signed   By: HeatJacqulynn Cadet.   On: 04/05/2018 14:04   Ct Head Wo Contrast  Result Date: 04/10/2018 CLINICAL DATA:  Confusion/altered mental status EXAM: CT HEAD WITHOUT CONTRAST  TECHNIQUE: Contiguous axial images were obtained from the base of the skull through the vertex without intravenous contrast. COMPARISON:  April 03, 2018 FINDINGS: Brain: There is stable diffuse atrophy. There is no intracranial mass, hemorrhage, extra-axial fluid collection, or midline shift. There is patchy small vessel disease in the centra semiovale bilaterally, stable. There is small vessel disease in each external capsule, more on the left than on the right, stable. No acute appearing infarct is evident. Vascular: There is no hyperdense vessel. There is calcification in each distal vertebral artery and in the carotid siphon regions bilaterally. Skull: The bony calvarium appears intact. Sinuses/Orbits: There is mucosal thickening in several ethmoid air cells. There is also mild mucosal thickening in the inferior right  maxillary antrum. Orbits appear symmetric bilaterally. Other: Mastoid air cells are clear. There is debris in the right external auditory canal. IMPRESSION: Stable atrophy with supratentorial small vessel disease. No acute infarct evident. No mass or hemorrhage. Foci of arterial vascular calcification noted. Areas of paranasal sinus disease noted. Probable cerumen in the right external auditory canal. Electronically Signed   By: Lowella Grip III M.D.   On: 04/10/2018 15:42   Ct Head Wo Contrast  Result Date: 04/03/2018 CLINICAL DATA:  Confusion, acute, unexplained. EXAM: CT HEAD WITHOUT CONTRAST TECHNIQUE: Contiguous axial images were obtained from the base of the skull through the vertex without intravenous contrast. COMPARISON:  None. FINDINGS: Brain: Advanced atrophy and diffuse white matter disease is present. No acute cortical infarct is present. Basal ganglia are intact. Brainstem and cerebellum are within normal limits. The ventricles are of proportionate to the degree of atrophy. No significant extraaxial fluid collection is present. Vascular: Atherosclerotic changes are present within the cavernous internal carotid arteries. There is no asymmetric hyperdense vessel. Skull: Calvarium is intact. No focal lytic or blastic lesions are present. Sinuses/Orbits: The paranasal sinuses and mastoid air cells are clear. The globes and orbits are within normal limits. IMPRESSION: 1. No acute intracranial abnormality. 2. Extensive white matter disease. This likely reflects the sequela of chronic microvascular ischemia. Electronically Signed   By: San Morelle M.D.   On: 04/03/2018 16:50   Ct Abdomen Pelvis W Contrast  Result Date: 04/03/2018 CLINICAL DATA:  Abdominal infection. EXAM: CT ABDOMEN AND PELVIS WITH CONTRAST TECHNIQUE: Multidetector CT imaging of the abdomen and pelvis was performed using the standard protocol following bolus administration of intravenous contrast. CONTRAST:  37m  OMNIPAQUE IOHEXOL 300 MG/ML SOLN orally, 73mOMNIPAQUE IOHEXOL 300 MG/ML SOLN intravenously. COMPARISON:  CT scan of April 21, 2011. FINDINGS: Lower chest: Large sliding-type hiatal hernia is noted. Visualized lung bases are unremarkable. Hepatobiliary: No focal liver abnormality is seen. No gallstones, gallbladder wall thickening, or biliary dilatation. Pancreas: Unremarkable. No pancreatic ductal dilatation or surrounding inflammatory changes. Spleen: Normal in size without focal abnormality. Adrenals/Urinary Tract: Adrenal glands appear normal. Nonobstructive left renal calculus is noted. No hydronephrosis or renal obstruction is noted. Foley catheter is noted in urinary bladder. Stomach/Bowel: There is no evidence of bowel obstruction or inflammation. The appendix is unremarkable. Vascular/Lymphatic: Aortic atherosclerosis. No enlarged abdominal or pelvic lymph nodes. Reproductive: Uterus and bilateral adnexa are unremarkable. Other: No abdominal wall hernia or abnormality. No abdominopelvic ascites. Musculoskeletal: No acute or significant osseous findings. IMPRESSION: Large sliding-type hiatal hernia. Nonobstructive left renal calculus. No hydronephrosis or renal obstruction is noted. No other acute abnormality seen in the abdomen or pelvis. Aortic Atherosclerosis (ICD10-I70.0). Electronically Signed   By: JaMarijo ConceptionM.D.   On: 04/03/2018 14:13  Dg Chest Port 1 View  Result Date: 04/05/2018 CLINICAL DATA:  Preop for hip surgery. EXAM: PORTABLE CHEST 1 VIEW COMPARISON:  02/17/2018 FINDINGS: Cardiac silhouette is normal in size. Moderate hiatal hernia. No mediastinal or hilar masses. Prominent bronchovascular and interstitial markings bilaterally similar to prior exams. Additional left lung base opacity is most likely atelectasis. No convincing pneumonia or pulmonary edema. No visualized pleural effusion and no pneumothorax. Skeletal structures are demineralized but grossly intact IMPRESSION: No acute  cardiopulmonary disease. Electronically Signed   By: Lajean Manes M.D.   On: 04/05/2018 14:28   Dg C-arm 1-60 Min  Result Date: 04/06/2018 CLINICAL DATA:  Fracture EXAM: OPERATIVE RIGHT HIP (WITH PELVIS IF PERFORMED) 7 VIEWS TECHNIQUE: Fluoroscopic spot image(s) were submitted for interpretation post-operatively. COMPARISON:  04/05/2018 FINDINGS: Multiple intraoperative spot images demonstrate placement of 3 screws across the right femoral neck fracture. No hardware complicating feature. Anatomic alignment. IMPRESSION: Internal fixation across the right femoral neck fracture. No complicating feature. Electronically Signed   By: Rolm Baptise M.D.   On: 04/06/2018 11:05   Dg Hip Operative Unilat With Pelvis Right  Result Date: 04/06/2018 CLINICAL DATA:  Fracture EXAM: OPERATIVE RIGHT HIP (WITH PELVIS IF PERFORMED) 7 VIEWS TECHNIQUE: Fluoroscopic spot image(s) were submitted for interpretation post-operatively. COMPARISON:  04/05/2018 FINDINGS: Multiple intraoperative spot images demonstrate placement of 3 screws across the right femoral neck fracture. No hardware complicating feature. Anatomic alignment. IMPRESSION: Internal fixation across the right femoral neck fracture. No complicating feature. Electronically Signed   By: Rolm Baptise M.D.   On: 04/06/2018 11:05   Dg Hip Unilat W Or Wo Pelvis 2-3 Views Right  Result Date: 04/05/2018 CLINICAL DATA:  Pt states she fell at home onset today and is c/o right hip pain and lower back pain. Pt denies any surgery to lower back or right hip. EXAM: DG HIP (WITH OR WITHOUT PELVIS) 2-3V RIGHT COMPARISON:  None. FINDINGS: There is a nondisplaced, non comminuted subcapital fracture of the right femoral neck with mild valgus angulation. No other fractures.  No bone lesions. Hip joints, SI joints and pubic symphysis are normally spaced and aligned. Skeletal structures are demineralized. Mild subcutaneous soft tissue edema lateral to the proximal right femur. IMPRESSION:  1. Nondisplaced, non comminuted subcapital fracture of the right femoral neck with mild valgus angulation. Electronically Signed   By: Lajean Manes M.D.   On: 04/05/2018 14:02    Lab Results: Basic Metabolic Panel: Recent Labs    04/12/18 0554  NA 135  K 3.8  CL 103  CO2 25  GLUCOSE 104*  BUN 12  CREATININE 0.50  CALCIUM 8.3*   Liver Function Tests: Recent Labs    04/12/18 0554  AST 15  ALT 8  ALKPHOS 74  BILITOT 0.2*  PROT 5.4*  ALBUMIN 2.1*     CBC: Recent Labs    04/12/18 0554  WBC 9.1  NEUTROABS 6.3  HGB 8.3*  HCT 27.1*  MCV 85.0  PLT 392    Recent Results (from the past 240 hour(s))  Surgical pcr screen     Status: None   Collection Time: 04/06/18  1:18 AM  Result Value Ref Range Status   MRSA, PCR NEGATIVE NEGATIVE Final   Staphylococcus aureus NEGATIVE NEGATIVE Final    Comment: (NOTE) The Xpert SA Assay (FDA approved for NASAL specimens in patients 38 years of age and older), is one component of a comprehensive surveillance program. It is not intended to diagnose infection nor to guide or monitor  treatment. Performed at [facility], [address] [place], [place] [address]   Urine culture     Status: Abnormal   Collection Time: 04/10/18  2:44 PM  Result Value Ref Range Status   Specimen Description   Final    URINE, CLEAN CATCH Performed at [facility], [address] [place], [place] [address]    Special Requests   Final    NONE Performed at [facility], [address] [place],  [address]    Culture >=100,000 COLONIES/mL PSEUDOMONAS AERUGINOSA (A)  Final   Report Status 04/13/2018 FINAL  Final   Organism ID, Bacteria PSEUDOMONAS AERUGINOSA (A)  Final      Susceptibility   Pseudomonas aeruginosa - MIC*    CEFTAZIDIME <=1 SENSITIVE Sensitive     CIPROFLOXACIN <=0.25 SENSITIVE Sensitive     GENTAMICIN <=1 SENSITIVE Sensitive     IMIPENEM 2 SENSITIVE Sensitive     PIP/TAZO <=4 SENSITIVE Sensitive     CEFEPIME <=1 SENSITIVE  Sensitive     * >=100,000 COLONIES/mL PSEUDOMONAS AERUGINOSA     Hospital Course: This is an 84 year old who had been in the hospital with a hip fracture.  She had done well getting over the hip fracture was eating and drinking feeling well.  It had been recommended that she go to skilled care facility which she and her family refused.  Arrangements were being made for her to have home health services with PT and RN.  However almost immediately after she got home she stopped eating and drinking and became confused.  Family called my office the next morning and she would came to the emergency department where she was found to have what appeared to be a urinary tract infection.  She has had trouble with urinary retention over the last 3 weeks or so and we have not been able to get the catheter out.  Her urinary tract infection is associated with indwelling Foley catheter.  We are trying to get her to urology for follow-up but that is been made more difficult by the covid 19 pandemic and by her hip fracture.  She also appeared to be dehydrated.  She was given IV fluids started on antibiotics and improved.  She grew Pseudomonas in her urine and sensitivities and her allergy panel showed that she was going to have to be treated with IV antibiotics.  It was recommended that she have 5 days of IV cefepime.  Initially family did not think they could manage it at home later felt like they probably could and arrangements are being made for home health services and home health IV antibiotics.  She is eating and drinking well and participating with physical therapy.  She had midline catheter placed for continued IV treatment.  Discharge Exam: Blood pressure 111/67, pulse 73, temperature 98.3 F (36.8 C), temperature source Oral, resp. rate 16, height _0  (1.6 m), weight 55.3 kg, SpO2 91 %. She is thin.  She is awake and alert.  She does not appear to be in any acute distress.  Disposition: Home with home health  services  Discharge Instructions    Face-to-face encounter (required for Medicare/Medicaid patients)   Complete by:  As directed    I Alonza Bogus certify that this patient is under my care and that I, or a nurse practitioner or physician's assistant working with me, had a face-to-face encounter that meets the physician face-to-face encounter requirements with this patient on 04/14/2018. The encounter with the patient was in  whole, or in part for the following medical condition(s) which is the primary reason for home health care (List medical condition): Altered mental status/urinary tract infection/recent hip fracture   The encounter with the patient was in whole, or in part, for the following medical condition, which is the primary reason for home health care:  Altered mental status/urinary tract infection/recent hip fracture   I certify that, based on my findings, the following services are medically necessary home health services:   Nursing Physical therapy     Reason for Medically Necessary Home Health Services:  Skilled Nursing- Change/Decline in Patient Status   My clinical findings support the need for the above services:  Unable to leave home safely without assistance and/or assistive device   Further, I certify that my clinical findings support that this patient is homebound due to:  Unable to leave home safely without assistance   Home Health   Complete by:  As directed    To provide the following care/treatments:   PT RN     She needs Foley catheter care.  She needs IV maintenance.  She will be on cefepime 2 g IV every 12 hours last dose will be on 04/17/2018.  Laboratory work, IV care etc. per agency protocol   Home infusion instructions [facility] May follow [place] Dosing Protocol; May administer Cathflo as needed to maintain patency of vascular access device.; Flushing of vascular access device: per [facility] Protocol: 0.9% NaCl pre/post medica...   Complete by:  As  directed    Instructions:  May follow [place] Dosing Protocol   Instructions:  May administer Cathflo as needed to maintain patency of vascular access device.   Instructions:  Flushing of vascular access device: per [facility] Protocol: 0.9% NaCl pre/post medication administration and prn patency; Heparin 100 u/ml, 68m for implanted ports and Heparin 10u/ml, 526mfor all other central venous catheters.   Instructions:  May follow [facility] Anaphylaxis Protocol for First Dose Administration in the home: 0.9% NaCl at 25-50 ml/hr to maintain IV access for protocol meds. Epinephrine 0.3 ml IV/IM PRN and Benadryl 25-50 IV/IM PRN s/s of anaphylaxis.   Instructions:  AdHartlynfusion Coordinator (RN) to assist per patient IV care needs in the home PRN.   Home infusion instructions [facility] May follow [facility] Protocol; May administer Cathflo as needed to maintain patency of vascular access device.; Flushing of vascular access device: [facility]: 0.9% NaCl pre/post medica...   Complete by:  As directed    Instructions:  May follow ACFort Jenningsosing Protocol   Instructions:  May administer Cathflo as needed to maintain patency of vascular access device.   Instructions:  Flushing of vascular access device: [facility]: 0.9% NaCl pre/post medication administration and prn patency; Heparin 100 u/ml, 47m21mor implanted ports and Heparin 10u/ml, 47ml46mr all other central venous catheters.   Instructions:  May follow [facility] Anaphylaxis Protocol for First Dose Administration in the home: 0.9% NaCl at 25-50 ml/hr to maintain IV access for protocol meds. Epinephrine 0.3 ml IV/IM PRN and Benadryl 25-50 IV/IM PRN s/s of anaphylaxis.   Instructions:  AdvaCasparusion Coordinator (RN) to assist per patient IV care needs in the home PRN.     Greater than 30 minutes were spent on this discharge activity   Signed: EdwaAlonza Bogus/10/2018, 8:41 AM

## 2018-04-14 NOTE — Progress Notes (Signed)
Occupational Therapy Treatment Patient Details Name: [name] MRN: [id number] DOB: [birth year] Today's Date: 04/14/2018    History of present illness [name] is a 84 y.o. female with medical history significant for COPD, GERD, osteoporosis, and anxiety who was recently admitted from 04/05/2018-04/09/2018 with a right hip fracture.  She underwent cannulated hip pinning on 04/06/2018.  It was recommended she be discharged to a skilled nursing facility however she refused and was discharged to home on 04/09/2018 with a Foley catheter in place due to urinary retention.  She returns to the ED today, 04/10/2018, with family reported increased confusion, generalized weakness, lethargy, and poor oral intake.  She denies any chest pain, dyspnea, abdominal pain, dysuria, nausea, vomiting, diarrhea, or peripheral edema.  She has not been out of bed due to her right hip pain.  She denies any loss of consciousness.   OT comments  Patient limited with shoulder exercises due to reports of pain and history of right shoulder fracture. Patient was able to complete exercises at shoulder level or under. Required physical placement of extended arm at shoulder level prior to completion of some exercises. VC for form and technique were completed. Discharge plan was updated as patient is planning to return home with Home health services.Patient requested to return to bed at end of session. Patient was left with nursing in room.   Follow Up Recommendations  Home health OT    Equipment Recommendations  None recommended by OT       Precautions / Restrictions Precautions Precautions: Fall Restrictions Weight Bearing Restrictions: Yes RLE Weight Bearing: Weight bearing as tolerated       Mobility Bed Mobility Overal bed mobility: Needs Assistance Bed Mobility: Sit to Supine     Supine to sit: Min assist;HOB elevated Sit to supine: Min assist   General bed mobility comments: Pt required assistance lifting her  right LE completely to clear the top of the mattress. Pt was able to slightly scoot her hips to the right once supine towards the middle of the bed.   Transfers Overall transfer level: Needs assistance Equipment used: Rolling walker (2 wheeled) Transfers: Sit to/from [facility] Sit to Stand: Min assist Stand pivot transfers: Min assist       General transfer comment: Cueing for hand placement prior to sitting. Patient required very light min assist to guide her shoulders forward over her knees in order to allow her to lift her buttocks off the chair and stand. Lifting assistance required.         ADL either performed or assessed with clinical judgement   ADL Overall ADL's : Needs assistance/impaired                     Lower Body Dressing: Total assistance(sitting) Lower Body Dressing Details (indicate cue type and reason): doffing hospital socks while seated on EOB                               Cognition Arousal/Alertness: Awake/alert Behavior During Therapy: WFL for tasks assessed/performed Overall Cognitive Status: Within Functional Limits for tasks assessed        Exercises General Exercises - Upper Extremity Shoulder Flexion: AROM;Both;10 reps Shoulder Horizontal ABduction: AROM;Both;Seated(Pt completed 4 sets of 3 repetitions due to fatigue) Shoulder Horizontal ADduction: AROM;Both;Seated(Pt completed 4 sets of 3 repetitions due to fatigue)  Shoulder Exercises Shoulder External Rotation: AROM;Both;Seated(12X) Elbow Flexion: AROM;Both;10 reps;Seated Elbow Extension: AROM;Both;10  reps;Seated Other Exercises Other Exercises: shoulder protraction, A/ROM, 10X, seated Other Exercises: Shoulder elevation, A/ROM, 10X, seated Other Exercises: Scapular retraction; A/ROM, 10X, seated           Pertinent Vitals/ Pain       Pain Assessment: 0-10 Pain Score: 4  Pain Location: bilateral shoulder and elbows Pain Descriptors / Indicators:  Aching Pain Intervention(s): Limited activity within patient's tolerance;Monitored during session         Frequency  Min 2X/week        Progress Toward Goals  OT Goals(current goals can now be found in the care plan section)  Progress towards OT goals: Progressing toward goals     Plan Discharge plan needs to be updated;Frequency remains appropriate       AM-PAC OT "6 Clicks" Daily Activity     Outcome Measure   Help from another person eating meals?: A Little Help from another person taking care of personal grooming?: A Little Help from another person toileting, which includes using toliet, bedpan, or urinal?: A Lot Help from another person bathing (including washing, rinsing, drying)?: A Lot Help from another person to put on and taking off regular upper body clothing?: A Lot Help from another person to put on and taking off regular lower body clothing?: A Lot 6 Click Score: 14    End of Session Equipment Utilized During Treatment: Rolling walker  OT Visit Diagnosis: Muscle weakness (generalized) (M62.81)   Activity Tolerance Patient tolerated treatment well   Patient Left in bed;with call bell/phone within reach;with bed alarm set;with nursing/sitter in room   Nurse Communication          Time: 9643-8381 OT Time Calculation (min): 21 min  Charges: OT General Charges $OT Visit: 1 Visit OT Treatments $Therapeutic Exercise: 8-22 mins  Ailene Ravel, OTR/L,CBIS  [phone]    , Clarene Duke 04/14/2018, 10:05 AM

## 2018-04-14 NOTE — Clinical Social Work Note (Signed)
Plan is for patient to discharge tomorrow 04/15/2018 after her 6:00 a.m. first dose of antibiotic is complete. [facility] will have RN to complete teaching with patient's daughter for her evening dose.  Patient's antibiotic will be supplied by [facility]. They are aware of the need for the evening dose and will have it in patient's home for her evening dose.   Patient will need EMS transport for discharge due to PICC line and recovery for recent hip repair surgery.     Modesty Rudy, Clydene Pugh, LCSW

## 2018-04-14 NOTE — Progress Notes (Signed)
Patient family member Kalman Shan came to pick up patients belongings. Left patients personal gown and slippers.

## 2018-04-14 NOTE — Progress Notes (Signed)
Dr. Luan Pulling called nurse and stated he wanted maxipime 2g in sodium chloride 133ml IV antibiotic given at 0600 instead of 1000.

## 2018-04-14 NOTE — Progress Notes (Signed)
Physical Therapy Treatment Patient Details Name: [name] MRN: [id number] DOB: 04-14-1934 Today's Date: 04/14/2018    History of Present Illness [name] is a 84 y.o. female with medical history significant for COPD, GERD, osteoporosis, and anxiety who was recently admitted from 04/05/2018-04/09/2018 with a right hip fracture.  She underwent cannulated hip pinning on 04/06/2018.  It was recommended she be discharged to a skilled nursing facility however she refused and was discharged to home on 04/09/2018 with a Foley catheter in place due to urinary retention.  She returns to the ED today, 04/10/2018, with family reported increased confusion, generalized weakness, lethargy, and poor oral intake.  She denies any chest pain, dyspnea, abdominal pain, dysuria, nausea, vomiting, diarrhea, or peripheral edema.  She has not been out of bed due to her right hip pain.  She denies any loss of consciousness.    PT Comments    Pt supine in bed and willing to participate with therapy today.  Min A with bed mobility, transfer training and gait today.  Pt required education on hand and foot placement for increased ease and safety with scooting and STS.  Able to increased distance with gait training using RW. No LOB episodes during gait, cueing to reduce drifting from Rt to Lt.  Seated therex complete for LE strenghtening with cueing for full range and controlled movements.  EOS pt left in chair with call bell within reach and chair alarm set.  No reports of pain through session, did c/o Rt hip "grabbing" during gait.    Follow Up Recommendations  SNF;Supervision/Assistance - 24 hour;Supervision for mobility/OOB     Equipment Recommendations  None recommended by PT    Recommendations for Other Services       Precautions / Restrictions Precautions Precautions: Fall Restrictions Weight Bearing Restrictions: Yes RLE Weight Bearing: Weight bearing as tolerated    Mobility  Bed Mobility Overal bed  mobility: Modified Independent Bed Mobility: Supine to Sit     Supine to sit: Min assist;HOB elevated     General bed mobility comments: Cueing for handplacment to assist with scooting to EOB  Transfers Overall transfer level: Modified independent Equipment used: Rolling walker (2 wheeled) Transfers: Sit to/from Stand Sit to Stand: Min assist         General transfer comment: cueing for hand placement to assist wiht scooting to edge of chair and STS, increased time to complete.  labored movement  Ambulation/Gait Ambulation/Gait assistance: Min assist Gait Distance (Feet): 95 Feet Assistive device: Rolling walker (2 wheeled) Gait Pattern/deviations: Decreased step length - right;Decreased step length - left;Decreased stride length Gait velocity: decreased   General Gait Details: limited by fatigue   Stairs             Wheelchair Mobility    Modified Rankin (Stroke Patients Only)       Balance                                            Cognition   Behavior During Therapy: WFL for tasks assessed/performed Overall Cognitive Status: Within Functional Limits for tasks assessed                                        Exercises General Exercises - Lower Extremity Long Arc  Quad: AROM;Both;10 reps;Seated Hip Flexion/Marching: AROM;Strengthening;Right;Left;10 reps Toe Raises: AROM;Strengthening;Both;Seated;10 reps Heel Raises: AROM;Strengthening;Both;10 reps;Seated    General Comments        Pertinent Vitals/Pain Pain Assessment: No/denies pain Pain Location: no c/o pain through session.  Did reports Rt hip "grabbing" some during gait, not painful per pt.   Pain Descriptors / Indicators: (grabbing during gait)    Home Living                      Prior Function            PT Goals (current goals can now be found in the care plan section)      Frequency    Min 4X/week      PT Plan Current plan  remains appropriate    Co-evaluation              AM-PAC PT "6 Clicks" Mobility   Outcome Measure  Help needed turning from your back to your side while in a flat bed without using bedrails?: A Lot Help needed moving from lying on your back to sitting on the side of a flat bed without using bedrails?: A Lot Help needed moving to and from a bed to a chair (including a wheelchair)?: A Little Help needed standing up from a chair using your arms (e.g., wheelchair or bedside chair)?: A Little Help needed to walk in hospital room?: A Little Help needed climbing 3-5 steps with a railing? : A Lot 6 Click Score: 15    End of Session Equipment Utilized During Treatment: Gait belt Activity Tolerance: Patient tolerated treatment well;Patient limited by fatigue Patient left: in chair;with call bell/phone within reach;with chair alarm set Nurse Communication: Mobility status PT Visit Diagnosis: Unsteadiness on feet (R26.81);Other abnormalities of gait and mobility (R26.89);Muscle weakness (generalized) (M62.81)     Time: 0630-1601 PT Time Calculation (min) (ACUTE ONLY): 25 min  Charges:  $Gait Training: 8-22 mins $Therapeutic Exercise: 8-22 mins                     [address] LPTA; [place]  Aldona Lento 04/14/2018, 9:37 AM

## 2018-04-14 NOTE — Progress Notes (Signed)
Patient has been up in the chair for breakfast and then walked with physical therapy. Patient is in the bed and has had lunch. Patient voices no complaints and verbalizes understanding of receiving the midline for IV antibiotics. Daughter Kalman Shan has called and has been updated on patients progress and plan.

## 2018-04-14 NOTE — Plan of Care (Signed)
  Problem: Acute Rehab OT Goals (only OT should resolve) Goal: Pt. Will Perform Eating Outcome: Progressing Goal: Pt. Will Perform Grooming Outcome: Progressing Goal: Pt. Will Perform Upper Body Dressing Outcome: Progressing Goal: Pt. Will Transfer To Toilet Outcome: Progressing Goal: Pt. Will Perform Toileting-Clothing Manipulation Outcome: Progressing Goal: Pt/Caregiver Will Perform Home Exercise Program Outcome: Progressing

## 2018-04-14 NOTE — Progress Notes (Signed)
Subjective: She says she feels okay.  Her urine is clearing up.  She is not having any shortness of breath.  She is still working with physical therapy.  Objective: Vital signs in last 24 hours: Temp:  [97.7 F (36.5 C)-98.3 F (36.8 C)] 98.3 F (36.8 C) (04/10 0551) Pulse Rate:  [73] 73 (04/10 0551) Resp:  [16-19] 16 (04/10 0551) BP: (97-111)/(63-67) 111/67 (04/10 0551) SpO2:  [91 %-99 %] 91 % (04/10 0739) Weight:  [55.3 kg] 55.3 kg (04/10 0551) Weight change: 0.6 kg Last BM Date: 04/12/18  Intake/Output from previous day: 04/09 0701 - 04/10 0700 In: 1280 [P.O.:1080; IV Piggyback:200] Out: 1900 [Urine:1900]  PHYSICAL EXAM General appearance: alert, cooperative and no distress Resp: clear to auscultation bilaterally Cardio: regular rate and rhythm, S1, S2 normal, no murmur, click, rub or gallop GI: soft, non-tender; bowel sounds normal; no masses,  no organomegaly Extremities: The area on her surgical site looks okay  Lab Results:  No results found for this or any previous visit (from the past 48 hour(s)).  ABGS No results for input(s): PHART, PO2ART, TCO2, HCO3 in the last 72 hours.  Invalid input(s): PCO2 CULTURES Recent Results (from the past 240 hour(s))  Surgical pcr screen     Status: None   Collection Time: 04/06/18  1:18 AM  Result Value Ref Range Status   MRSA, PCR NEGATIVE NEGATIVE Final   Staphylococcus aureus NEGATIVE NEGATIVE Final    Comment: (NOTE) The Xpert SA Assay (FDA approved for NASAL specimens in patients 61 years of age and older), is one component of a comprehensive surveillance program. It is not intended to diagnose infection nor to guide or monitor treatment. Performed at [facility], [address], [place], [place] [address]   Urine culture     Status: Abnormal   Collection Time: 04/10/18  2:44 PM  Result Value Ref Range Status   Specimen Description   Final    URINE, CLEAN CATCH Performed at [facility], [address]  [place], [place] [address]    Special Requests   Final    NONE Performed at [facility], [address], [place], [place] [address]    Culture >=100,000 COLONIES/mL PSEUDOMONAS AERUGINOSA (A)  Final   Report Status 04/13/2018 FINAL  Final   Organism ID, Bacteria PSEUDOMONAS AERUGINOSA (A)  Final      Susceptibility   Pseudomonas aeruginosa - MIC*    CEFTAZIDIME <=1 SENSITIVE Sensitive     CIPROFLOXACIN <=0.25 SENSITIVE Sensitive     GENTAMICIN <=1 SENSITIVE Sensitive     IMIPENEM 2 SENSITIVE Sensitive     PIP/TAZO <=4 SENSITIVE Sensitive     CEFEPIME <=1 SENSITIVE Sensitive     * >=100,000 COLONIES/mL PSEUDOMONAS AERUGINOSA   Studies/Results: No results found.  Medications:  Prior to Admission:  Medications Prior to Admission  Medication Sig Dispense Refill Last Dose  . albuterol (PROAIR HFA) 108 (90 Base) MCG/ACT inhaler Inhale 2 puffs into the lungs every 4 (four) hours as needed (4-6 hours).    Past Week at Unknown time  . albuterol (PROVENTIL) (2.5 MG/3ML) 0.083% nebulizer solution Take 2.5 mg by nebulization every 6 (six) hours as needed for wheezing or shortness of breath.   Past Week at Unknown time  . ALPRAZolam (XANAX) 1 MG tablet Take 0.5 tablets (0.5 mg total) by mouth 3 (three) times daily as needed for anxiety. (Patient taking differently: Take 1 mg by mouth 3 (three) times daily as needed for anxiety. ) 30 tablet 0  04/10/2018 at Unknown time  . cetirizine (ZYRTEC) 10 MG tablet Take 10 mg by mouth daily.   Past Week at Unknown time  . docusate sodium (COLACE) 100 MG capsule Take 1 capsule (100 mg total) by mouth 2 (two) times daily. 10 capsule 0 Past Week at Unknown time  . furosemide (LASIX) 40 MG tablet Take 40 mg by mouth daily.   Past Week at Unknown time  . pantoprazole (PROTONIX) 40 MG tablet TAKE (1) TABLET BY MOUTH TWICE A DAY WITH MEALS (BREAKFAST AND SUPPER) (Patient taking differently: Take 40 mg by mouth daily as needed. ) 60 tablet 3 Past Week at Unknown time  .  potassium chloride (K-DUR) 10 MEQ tablet Take 10 mEq by mouth daily.   Past Week at Unknown time  . traMADol (ULTRAM) 50 MG tablet Take 100 mg by mouth every 4 (four) hours as needed for moderate pain or severe pain.    04/10/2018 at Unknown time  . umeclidinium-vilanterol (ANORO ELLIPTA) 62.5-25 MCG/INH AEPB Inhale 1 puff into the lungs daily.   Past Week at Unknown time  . VOLTAREN 1 % GEL Apply 1 g topically daily as needed. pain   Past Week at Unknown time  . zolpidem (AMBIEN) 5 MG tablet Take 5 mg by mouth at bedtime.    Past Week at Unknown time   Scheduled: . diclofenac sodium  4 g Topical QID  . enoxaparin (LOVENOX) injection  40 mg Subcutaneous Q24H  . umeclidinium-vilanterol  1 puff Inhalation Daily   Continuous: . ceFEPime (MAXIPIME) IV 2 g (04/14/18 9563)   OVF:IEPPIRJJOACZY **OR** acetaminophen, albuterol, ALPRAZolam, pantoprazole, traMADol  Assesment: She was admitted with altered mental status which seems to be related to a Pseudomonas urinary tract infection.  She has had an indwelling Foley catheter for about 3 weeks because of urinary retention and she is awaiting follow-up with urology.  She still had urinary retention this admission so she has Foley catheter again and the plan will be for her to go home with that at least for now.  She has COPD and has what looks like a patch of pneumonia on chest x-ray which is really pretty asymptomatic.  Her current Zosyn will cover that as well as her Pseudomonas urinary tract infection  She had mild hyponatremia and that is better  She had a recent hip fracture had been recommended to go to a skilled care facility but has elected to go home.  She was dehydrated which is better.  She seems to be maintaining hydration orally now Principal Problem:   Altered mental status Active Problems:   Closed right hip fracture (HCC)   GERD (gastroesophageal reflux disease)   COPD (chronic obstructive pulmonary disease) (HCC)   Urinary  retention   Anxiety   Hyponatremia   UTI (urinary tract infection) due to urinary indwelling catheter ([place])   Urinary tract infection associated with catheterization of urinary tract ([place])    Plan: She will need a total of at least 5 days of IV antibiotics.  Her daughter initially said that she did not think she could manage that at home but apparently she has changed her mind and thinks that she can now.  I am going to have midline catheter placed and go ahead and start making arrangements for discharge in the next 24 hours    LOS: 3 days   Alonza Bogus 04/14/2018, 8:30 AM

## 2018-04-15 MED ORDER — HEPARIN SOD (PORK) LOCK FLUSH 100 UNIT/ML IV SOLN
250.0000 [IU] | INTRAVENOUS | Status: AC | PRN
  Administered 2018-04-15: 250 [IU]
  Filled 2018-04-15: qty 5

## 2018-04-15 MED ORDER — CEFEPIME IV (FOR PTA / DISCHARGE USE ONLY): 2.0000 g | INTRAVENOUS | 0 refills | Status: DC

## 2018-04-15 NOTE — Progress Notes (Signed)
PHARMACY CONSULT NOTE FOR:  OUTPATIENT  PARENTERAL ANTIBIOTIC THERAPY (OPAT)  Indication: UTI Pseudomonas Regimen: Cefepime 2gm iv q24h  End date: 04/19/18  IV antibiotic discharge orders updated and discussed with Dr. Luan Pulling  CrCl 49mls/min  Thank you for allowing pharmacy to be a part of this patient's care.  Isac Sarna, BS [place], BCPS Clinical Pharmacist Pager [phone] 04/15/2018, 11:31 AM

## 2018-04-15 NOTE — Progress Notes (Addendum)
IV removed and discharge instructions reviewed with daughter.  Script for antibiotic in packet and spoke with Education officer, museum, Josh, concerning changing abx to daily from BID.  He contacted home health concerning this.  PICC hep locked and noted.  EMS called and are here now to get patient and take her home. Called [name] to let her know EMS is here  Left message. Dressing to right hip dry and intact

## 2018-04-15 NOTE — Progress Notes (Signed)
Subjective: She says she feels well and wants to go home.  She has no new complaints.  Her breathing is good.  She is eating and drinking well.  She is making urine.  Objective: Vital signs in last 24 hours: Temp:  [97.7 F (36.5 C)-98.1 F (36.7 C)] 97.7 F (36.5 C) (04/11 0604) Pulse Rate:  [69-84] 69 (04/11 0604) Resp:  [16-18] 16 (04/11 0604) BP: (103-134)/(71-88) 103/88 (04/11 0604) SpO2:  [87 %-97 %] 94 % (04/11 0739) Weight:  [54.3 kg] 54.3 kg (04/11 0604) Weight change: -1 kg Last BM Date: 04/12/18  Intake/Output from previous day: 04/10 0701 - 04/11 0700 In: 1080 [P.O.:1080] Out: 850 [Urine:850]  PHYSICAL EXAM General appearance: alert, cooperative and no distress Resp: clear to auscultation bilaterally Cardio: regular rate and rhythm, S1, S2 normal, no murmur, click, rub or gallop GI: soft, non-tender; bowel sounds normal; no masses,  no organomegaly Extremities: extremities normal, atraumatic, no cyanosis or edema  Lab Results:  No results found for this or any previous visit (from the past 48 hour(s)).  ABGS No results for input(s): PHART, PO2ART, TCO2, HCO3 in the last 72 hours.  Invalid input(s): PCO2 CULTURES Recent Results (from the past 240 hour(s))  Surgical pcr screen     Status: None   Collection Time: 04/06/18  1:18 AM  Result Value Ref Range Status   MRSA, PCR NEGATIVE NEGATIVE Final   Staphylococcus aureus NEGATIVE NEGATIVE Final    Comment: (NOTE) The Xpert SA Assay (FDA approved for NASAL specimens in patients 15 years of age and older), is one component of a comprehensive surveillance program. It is not intended to diagnose infection nor to guide or monitor treatment. Performed at [facility], [address], [place], [place] [address]   Urine culture     Status: Abnormal   Collection Time: 04/10/18  2:44 PM  Result Value Ref Range Status   Specimen Description   Final    URINE, CLEAN CATCH Performed at [facility], [address], [place], [place] [address]    Special Requests   Final    NONE Performed at [facility], [address] [place], [place] [address]    Culture >=100,000 COLONIES/mL PSEUDOMONAS AERUGINOSA (A)  Final   Report Status 04/13/2018 FINAL  Final   Organism ID, Bacteria PSEUDOMONAS AERUGINOSA (A)  Final      Susceptibility   Pseudomonas aeruginosa - MIC*    CEFTAZIDIME <=1 SENSITIVE Sensitive     CIPROFLOXACIN <=0.25 SENSITIVE Sensitive     GENTAMICIN <=1 SENSITIVE Sensitive     IMIPENEM 2 SENSITIVE Sensitive     PIP/TAZO <=4 SENSITIVE Sensitive     CEFEPIME <=1 SENSITIVE Sensitive     * >=100,000 COLONIES/mL PSEUDOMONAS AERUGINOSA   Studies/Results: No results found.  Medications:  Prior to Admission:  Medications Prior to Admission  Medication Sig Dispense Refill Last Dose  . albuterol (PROAIR HFA) 108 (90 Base) MCG/ACT inhaler Inhale 2 puffs into the lungs every 4 (four) hours as needed (4-6 hours).    Past Week at Unknown time  . albuterol (PROVENTIL) (2.5 MG/3ML) 0.083% nebulizer solution Take 2.5 mg by nebulization every 6 (six) hours as needed for wheezing or shortness of breath.   Past Week at Unknown time  . ALPRAZolam (XANAX) 1 MG tablet Take 0.5 tablets (0.5 mg total) by mouth 3 (three) times daily as needed for anxiety. (Patient taking differently: Take 1 mg by mouth 3 (three) times daily as needed for anxiety. ) 30  tablet 0 04/10/2018 at Unknown time  . cetirizine (ZYRTEC) 10 MG tablet Take 10 mg by mouth daily.   Past Week at Unknown time  . docusate sodium (COLACE) 100 MG capsule Take 1 capsule (100 mg total) by mouth 2 (two) times daily. 10 capsule 0 Past Week at Unknown time  . furosemide (LASIX) 40 MG tablet Take 40 mg by mouth daily.   Past Week at Unknown time  . pantoprazole (PROTONIX) 40 MG tablet TAKE (1) TABLET BY MOUTH TWICE A DAY WITH MEALS (BREAKFAST AND SUPPER) (Patient taking differently: Take 40 mg by mouth daily as needed. ) 60 tablet 3 Past Week at Unknown time   . potassium chloride (K-DUR) 10 MEQ tablet Take 10 mEq by mouth daily.   Past Week at Unknown time  . traMADol (ULTRAM) 50 MG tablet Take 100 mg by mouth every 4 (four) hours as needed for moderate pain or severe pain.    04/10/2018 at Unknown time  . umeclidinium-vilanterol (ANORO ELLIPTA) 62.5-25 MCG/INH AEPB Inhale 1 puff into the lungs daily.   Past Week at Unknown time  . VOLTAREN 1 % GEL Apply 1 g topically daily as needed. pain   Past Week at Unknown time  . zolpidem (AMBIEN) 5 MG tablet Take 5 mg by mouth at bedtime.    Past Week at Unknown time   Scheduled: . diclofenac sodium  4 g Topical QID  . enoxaparin (LOVENOX) injection  40 mg Subcutaneous Q24H  . sodium chloride flush  10-40 mL Intracatheter Q12H  . umeclidinium-vilanterol  1 puff Inhalation Daily   Continuous: . ceFEPime (MAXIPIME) IV 2 g (04/15/18 6222)   LNL:[id number] **OR** acetaminophen, albuterol, ALPRAZolam, heparin lock flush, pantoprazole, sodium chloride flush, traMADol  Assesment: She was admitted with altered mental status which is related to urinary tract infection.  Her urinary tract infection is related to indwelling Foley catheter that she has because of urinary retention.  She is on IV treatments and will need to finish that at home.  She had midline catheter placed yesterday.  She had recent hip fracture and has been recommended that she go to skilled care facility but she refuses.  She will go home with home health services to finish IV antibiotics Principal Problem:   Altered mental status Active Problems:   Closed right hip fracture (HCC)   GERD (gastroesophageal reflux disease)   COPD (chronic obstructive pulmonary disease) (HCC)   Urinary retention   Anxiety   Hyponatremia   UTI (urinary tract infection) due to urinary indwelling catheter ([place])   Urinary tract infection associated with catheterization of urinary tract ([place])    Plan: Discharge home today    LOS: 4 days   [name] 04/15/2018, 9:52 AM

## 2018-04-15 NOTE — TOC Transition Note (Addendum)
Transition of Care [facility]) - CM/SW Discharge Note   Patient Details  Name: SAMARIA ANES MRN: [id number] Date of Birth: Jun 11, 1934  [facility] CM/SW Contact:  Latanya Maudlin, RN Phone Number: 04/15/2018, 10:58 AM   Clinical Narrative:   Patient to be discharged per MD order. Orders in place for home health services. Patient was previously set up with home health via [facility]. Notified Malachy Mood of pending discharge. Patient is set to discharge on Iv abx. Per RN schedule has changed to once daily dose. Notified Malachy Mood of [facility] and start of care is planned for tomorrow to begin IV abx administration. PICC line in place.Patient has all other needed DME from recent hip surgery.        Patient Goals and CMS Choice        Discharge Placement                       Discharge Plan and Services                          Social Determinants of Health (SDOH) Interventions     Readmission Risk Interventions Readmission Risk Prevention Plan 04/15/2018 04/07/2018  Transportation Screening Complete Complete  PCP or Specialist Appt within 5-7 Days Complete Complete  Home Care Screening Complete Complete  Medication Review (RN CM) Complete Complete  Some recent data might be hidden

## 2018-04-17 DIAGNOSIS — E785 Hyperlipidemia, unspecified: Secondary | ICD-10-CM | POA: Diagnosis not present

## 2018-04-17 DIAGNOSIS — Z9181 History of falling: Secondary | ICD-10-CM | POA: Diagnosis not present

## 2018-04-17 DIAGNOSIS — J449 Chronic obstructive pulmonary disease, unspecified: Secondary | ICD-10-CM | POA: Diagnosis not present

## 2018-04-17 DIAGNOSIS — M80051D Age-related osteoporosis with current pathological fracture, right femur, subsequent encounter for fracture with routine healing: Secondary | ICD-10-CM | POA: Diagnosis not present

## 2018-04-17 DIAGNOSIS — Z466 Encounter for fitting and adjustment of urinary device: Secondary | ICD-10-CM | POA: Diagnosis not present

## 2018-04-17 DIAGNOSIS — K449 Diaphragmatic hernia without obstruction or gangrene: Secondary | ICD-10-CM | POA: Diagnosis not present

## 2018-04-18 DIAGNOSIS — Z9181 History of falling: Secondary | ICD-10-CM | POA: Diagnosis not present

## 2018-04-18 DIAGNOSIS — Z466 Encounter for fitting and adjustment of urinary device: Secondary | ICD-10-CM | POA: Diagnosis not present

## 2018-04-18 DIAGNOSIS — K449 Diaphragmatic hernia without obstruction or gangrene: Secondary | ICD-10-CM | POA: Diagnosis not present

## 2018-04-18 DIAGNOSIS — E785 Hyperlipidemia, unspecified: Secondary | ICD-10-CM | POA: Diagnosis not present

## 2018-04-18 DIAGNOSIS — M80051D Age-related osteoporosis with current pathological fracture, right femur, subsequent encounter for fracture with routine healing: Secondary | ICD-10-CM | POA: Diagnosis not present

## 2018-04-18 DIAGNOSIS — J449 Chronic obstructive pulmonary disease, unspecified: Secondary | ICD-10-CM | POA: Diagnosis not present

## 2018-04-19 DIAGNOSIS — Z466 Encounter for fitting and adjustment of urinary device: Secondary | ICD-10-CM | POA: Diagnosis not present

## 2018-04-19 DIAGNOSIS — E785 Hyperlipidemia, unspecified: Secondary | ICD-10-CM | POA: Diagnosis not present

## 2018-04-19 DIAGNOSIS — K449 Diaphragmatic hernia without obstruction or gangrene: Secondary | ICD-10-CM | POA: Diagnosis not present

## 2018-04-19 DIAGNOSIS — Z9181 History of falling: Secondary | ICD-10-CM | POA: Diagnosis not present

## 2018-04-19 DIAGNOSIS — M80051D Age-related osteoporosis with current pathological fracture, right femur, subsequent encounter for fracture with routine healing: Secondary | ICD-10-CM | POA: Diagnosis not present

## 2018-04-19 DIAGNOSIS — J449 Chronic obstructive pulmonary disease, unspecified: Secondary | ICD-10-CM | POA: Diagnosis not present

## 2018-04-20 DIAGNOSIS — E785 Hyperlipidemia, unspecified: Secondary | ICD-10-CM | POA: Diagnosis not present

## 2018-04-20 DIAGNOSIS — M80051D Age-related osteoporosis with current pathological fracture, right femur, subsequent encounter for fracture with routine healing: Secondary | ICD-10-CM | POA: Diagnosis not present

## 2018-04-20 DIAGNOSIS — Z466 Encounter for fitting and adjustment of urinary device: Secondary | ICD-10-CM | POA: Diagnosis not present

## 2018-04-20 DIAGNOSIS — Z9181 History of falling: Secondary | ICD-10-CM | POA: Diagnosis not present

## 2018-04-20 DIAGNOSIS — K449 Diaphragmatic hernia without obstruction or gangrene: Secondary | ICD-10-CM | POA: Diagnosis not present

## 2018-04-20 DIAGNOSIS — J449 Chronic obstructive pulmonary disease, unspecified: Secondary | ICD-10-CM | POA: Diagnosis not present

## 2018-04-21 DIAGNOSIS — R3914 Feeling of incomplete bladder emptying: Secondary | ICD-10-CM | POA: Diagnosis not present

## 2018-04-25 DIAGNOSIS — M80051D Age-related osteoporosis with current pathological fracture, right femur, subsequent encounter for fracture with routine healing: Secondary | ICD-10-CM | POA: Diagnosis not present

## 2018-04-25 DIAGNOSIS — Z466 Encounter for fitting and adjustment of urinary device: Secondary | ICD-10-CM | POA: Diagnosis not present

## 2018-04-25 DIAGNOSIS — E785 Hyperlipidemia, unspecified: Secondary | ICD-10-CM | POA: Diagnosis not present

## 2018-04-25 DIAGNOSIS — K449 Diaphragmatic hernia without obstruction or gangrene: Secondary | ICD-10-CM | POA: Diagnosis not present

## 2018-04-25 DIAGNOSIS — J449 Chronic obstructive pulmonary disease, unspecified: Secondary | ICD-10-CM | POA: Diagnosis not present

## 2018-04-25 DIAGNOSIS — Z9181 History of falling: Secondary | ICD-10-CM | POA: Diagnosis not present

## 2018-04-27 DIAGNOSIS — Z9181 History of falling: Secondary | ICD-10-CM | POA: Diagnosis not present

## 2018-04-27 DIAGNOSIS — Z466 Encounter for fitting and adjustment of urinary device: Secondary | ICD-10-CM | POA: Diagnosis not present

## 2018-04-27 DIAGNOSIS — K449 Diaphragmatic hernia without obstruction or gangrene: Secondary | ICD-10-CM | POA: Diagnosis not present

## 2018-04-27 DIAGNOSIS — E785 Hyperlipidemia, unspecified: Secondary | ICD-10-CM | POA: Diagnosis not present

## 2018-04-27 DIAGNOSIS — Z8781 Personal history of (healed) traumatic fracture: Principal | ICD-10-CM

## 2018-04-27 DIAGNOSIS — M80051D Age-related osteoporosis with current pathological fracture, right femur, subsequent encounter for fracture with routine healing: Secondary | ICD-10-CM | POA: Diagnosis not present

## 2018-04-27 DIAGNOSIS — Z9889 Other specified postprocedural states: Secondary | ICD-10-CM | POA: Insufficient documentation

## 2018-04-27 DIAGNOSIS — J449 Chronic obstructive pulmonary disease, unspecified: Secondary | ICD-10-CM | POA: Diagnosis not present

## 2018-04-28 ENCOUNTER — Ambulatory Visit (INDEPENDENT_AMBULATORY_CARE_PROVIDER_SITE_OTHER): Payer: Medicare Other

## 2018-04-28 ENCOUNTER — Telehealth: Payer: Self-pay | Admitting: Radiology

## 2018-04-28 ENCOUNTER — Encounter: Payer: Self-pay | Admitting: Orthopedic Surgery

## 2018-04-28 ENCOUNTER — Ambulatory Visit (INDEPENDENT_AMBULATORY_CARE_PROVIDER_SITE_OTHER): Payer: Medicare Other | Admitting: Orthopedic Surgery

## 2018-04-28 ENCOUNTER — Other Ambulatory Visit: Payer: Self-pay

## 2018-04-28 VITALS — BP 86/55 | HR 91 | Temp 97.6°F

## 2018-04-28 DIAGNOSIS — Z9889 Other specified postprocedural states: Secondary | ICD-10-CM

## 2018-04-28 DIAGNOSIS — Z8781 Personal history of (healed) traumatic fracture: Secondary | ICD-10-CM | POA: Diagnosis not present

## 2018-04-28 DIAGNOSIS — N390 Urinary tract infection, site not specified: Secondary | ICD-10-CM | POA: Diagnosis not present

## 2018-04-28 DIAGNOSIS — R3914 Feeling of incomplete bladder emptying: Secondary | ICD-10-CM | POA: Diagnosis not present

## 2018-04-28 NOTE — Addendum Note (Signed)
Addended byCandice Camp on: 04/28/2018 12:05 PM   Modules accepted: Orders

## 2018-04-28 NOTE — Progress Notes (Signed)
Chief Complaint  Patient presents with  . Follow-up    Recheck on right hip, DOS 04-06-18.    84 years old 22 days after internal fixation left hip with cannulated screws Her postop course was complicated by inability to void without a Foley catheter in place which was reinserted. Her therapy at home has been active by COVID-19 with decreased amount of visits Her daughter tells me she is stand and pivot with assist and ambulates with assist with a walker  X-ray was obtained today and the x-ray shows that the fracture is stabilized and there are no hardware complications  The incision is clean dry and intact the staples were extracted  Postoperative plan is for continue weightbearing as tolerated x-rays in 6 weeks  Encounter Diagnosis  Name Primary?  . S/P internal fixation right hip fracture cannulated screw placement 04/06/18 Yes     OPERATIVE REPORT   Date: 04/06/2018     Preop dx RT hip fracture - femoral neck  Post op dx same  Procedure otif  RIGHT hip  Implants asnis titanium cannulated screws x 3  Surgeon Aline Brochure   Anesthesia spinal Findings at surgery non displaced [facility] femoral neck fracture    The postoperative plan is for immediate weightbearing as tolerated   Radiographs at 2, 6 and 12 weeks.   DVT prophylaxis will be provided as well for one month. ASPIRIN   CPT code [phone]

## 2018-04-28 NOTE — Telephone Encounter (Signed)
Patient wants to dc care with [facility] and have [facility] take over care. I called patients daughter to advise.

## 2018-04-29 ENCOUNTER — Other Ambulatory Visit: Payer: Self-pay | Admitting: Gastroenterology

## 2018-05-01 DIAGNOSIS — N952 Postmenopausal atrophic vaginitis: Secondary | ICD-10-CM | POA: Diagnosis not present

## 2018-05-01 DIAGNOSIS — R3914 Feeling of incomplete bladder emptying: Secondary | ICD-10-CM | POA: Diagnosis not present

## 2018-05-01 NOTE — Telephone Encounter (Signed)
Call received from Leda Quail at [facility], ph# [phone]; states Dr Aline Brochure is aware of patient's request to switch from [facility] to [facility]; asking if orders for nurse care can be added. States patient has appointment with urologist today, 05/01/18, to have foley catheter removed.  Please advise.

## 2018-05-01 NOTE — Telephone Encounter (Signed)
Spoke with pts daughter Kalman Shan. Pt is taking it twice a day.

## 2018-05-01 NOTE — Telephone Encounter (Signed)
Prescription is unclear: is this BID or once daily?

## 2018-05-02 ENCOUNTER — Telehealth: Payer: Self-pay | Admitting: Orthopedic Surgery

## 2018-05-02 DIAGNOSIS — S72011D Unspecified intracapsular fracture of right femur, subsequent encounter for closed fracture with routine healing: Secondary | ICD-10-CM | POA: Diagnosis not present

## 2018-05-02 DIAGNOSIS — W19XXXD Unspecified fall, subsequent encounter: Secondary | ICD-10-CM | POA: Diagnosis not present

## 2018-05-02 DIAGNOSIS — L989 Disorder of the skin and subcutaneous tissue, unspecified: Secondary | ICD-10-CM | POA: Diagnosis not present

## 2018-05-02 NOTE — Telephone Encounter (Signed)
Duplicate, se others.

## 2018-05-02 NOTE — Telephone Encounter (Signed)
What are nursing orders for, we can not help with foley, I called for more information. Left message for Vaughan Basta to call me back.

## 2018-05-02 NOTE — Telephone Encounter (Signed)
[name] returned your call stating that this patient's PT will start today. Also, stated that if they need a nurse for her they will call her PCP.  [phone]

## 2018-05-02 NOTE — Telephone Encounter (Signed)
Thank you. I told them we could not give nursing orders related to the foley catheter, since she has a urologist, to you [facility]

## 2018-05-03 DIAGNOSIS — W19XXXD Unspecified fall, subsequent encounter: Secondary | ICD-10-CM | POA: Diagnosis not present

## 2018-05-03 DIAGNOSIS — S72011D Unspecified intracapsular fracture of right femur, subsequent encounter for closed fracture with routine healing: Secondary | ICD-10-CM | POA: Diagnosis not present

## 2018-05-03 DIAGNOSIS — L989 Disorder of the skin and subcutaneous tissue, unspecified: Secondary | ICD-10-CM | POA: Diagnosis not present

## 2018-05-04 DIAGNOSIS — W19XXXD Unspecified fall, subsequent encounter: Secondary | ICD-10-CM | POA: Diagnosis not present

## 2018-05-04 DIAGNOSIS — L989 Disorder of the skin and subcutaneous tissue, unspecified: Secondary | ICD-10-CM | POA: Diagnosis not present

## 2018-05-04 DIAGNOSIS — S72011D Unspecified intracapsular fracture of right femur, subsequent encounter for closed fracture with routine healing: Secondary | ICD-10-CM | POA: Diagnosis not present

## 2018-05-05 DIAGNOSIS — L989 Disorder of the skin and subcutaneous tissue, unspecified: Secondary | ICD-10-CM | POA: Diagnosis not present

## 2018-05-05 DIAGNOSIS — S72011D Unspecified intracapsular fracture of right femur, subsequent encounter for closed fracture with routine healing: Secondary | ICD-10-CM | POA: Diagnosis not present

## 2018-05-05 DIAGNOSIS — W19XXXD Unspecified fall, subsequent encounter: Secondary | ICD-10-CM | POA: Diagnosis not present

## 2018-05-06 DIAGNOSIS — S72011D Unspecified intracapsular fracture of right femur, subsequent encounter for closed fracture with routine healing: Secondary | ICD-10-CM | POA: Diagnosis not present

## 2018-05-06 DIAGNOSIS — W19XXXD Unspecified fall, subsequent encounter: Secondary | ICD-10-CM | POA: Diagnosis not present

## 2018-05-06 DIAGNOSIS — L989 Disorder of the skin and subcutaneous tissue, unspecified: Secondary | ICD-10-CM | POA: Diagnosis not present

## 2018-05-08 DIAGNOSIS — L989 Disorder of the skin and subcutaneous tissue, unspecified: Secondary | ICD-10-CM | POA: Diagnosis not present

## 2018-05-08 DIAGNOSIS — S72011D Unspecified intracapsular fracture of right femur, subsequent encounter for closed fracture with routine healing: Secondary | ICD-10-CM | POA: Diagnosis not present

## 2018-05-08 DIAGNOSIS — W19XXXD Unspecified fall, subsequent encounter: Secondary | ICD-10-CM | POA: Diagnosis not present

## 2018-05-09 DIAGNOSIS — L989 Disorder of the skin and subcutaneous tissue, unspecified: Secondary | ICD-10-CM | POA: Diagnosis not present

## 2018-05-09 DIAGNOSIS — W19XXXD Unspecified fall, subsequent encounter: Secondary | ICD-10-CM | POA: Diagnosis not present

## 2018-05-09 DIAGNOSIS — S72011D Unspecified intracapsular fracture of right femur, subsequent encounter for closed fracture with routine healing: Secondary | ICD-10-CM | POA: Diagnosis not present

## 2018-05-11 DIAGNOSIS — L989 Disorder of the skin and subcutaneous tissue, unspecified: Secondary | ICD-10-CM | POA: Diagnosis not present

## 2018-05-11 DIAGNOSIS — S72011D Unspecified intracapsular fracture of right femur, subsequent encounter for closed fracture with routine healing: Secondary | ICD-10-CM | POA: Diagnosis not present

## 2018-05-11 DIAGNOSIS — W19XXXD Unspecified fall, subsequent encounter: Secondary | ICD-10-CM | POA: Diagnosis not present

## 2018-05-12 DIAGNOSIS — W19XXXD Unspecified fall, subsequent encounter: Secondary | ICD-10-CM | POA: Diagnosis not present

## 2018-05-12 DIAGNOSIS — L989 Disorder of the skin and subcutaneous tissue, unspecified: Secondary | ICD-10-CM | POA: Diagnosis not present

## 2018-05-12 DIAGNOSIS — S72011D Unspecified intracapsular fracture of right femur, subsequent encounter for closed fracture with routine healing: Secondary | ICD-10-CM | POA: Diagnosis not present

## 2018-05-15 DIAGNOSIS — R3914 Feeling of incomplete bladder emptying: Secondary | ICD-10-CM | POA: Diagnosis not present

## 2018-05-15 DIAGNOSIS — N952 Postmenopausal atrophic vaginitis: Secondary | ICD-10-CM | POA: Diagnosis not present

## 2018-05-16 DIAGNOSIS — L989 Disorder of the skin and subcutaneous tissue, unspecified: Secondary | ICD-10-CM | POA: Diagnosis not present

## 2018-05-16 DIAGNOSIS — S72011D Unspecified intracapsular fracture of right femur, subsequent encounter for closed fracture with routine healing: Secondary | ICD-10-CM | POA: Diagnosis not present

## 2018-05-16 DIAGNOSIS — W19XXXD Unspecified fall, subsequent encounter: Secondary | ICD-10-CM | POA: Diagnosis not present

## 2018-05-19 DIAGNOSIS — Z79899 Other long term (current) drug therapy: Secondary | ICD-10-CM | POA: Diagnosis not present

## 2018-05-19 DIAGNOSIS — R4182 Altered mental status, unspecified: Secondary | ICD-10-CM | POA: Diagnosis not present

## 2018-05-19 DIAGNOSIS — S72011D Unspecified intracapsular fracture of right femur, subsequent encounter for closed fracture with routine healing: Secondary | ICD-10-CM | POA: Diagnosis not present

## 2018-05-19 DIAGNOSIS — M1990 Unspecified osteoarthritis, unspecified site: Secondary | ICD-10-CM | POA: Diagnosis not present

## 2018-05-19 DIAGNOSIS — F419 Anxiety disorder, unspecified: Secondary | ICD-10-CM | POA: Diagnosis not present

## 2018-05-19 DIAGNOSIS — F0390 Unspecified dementia without behavioral disturbance: Secondary | ICD-10-CM | POA: Diagnosis not present

## 2018-05-19 DIAGNOSIS — W19XXXD Unspecified fall, subsequent encounter: Secondary | ICD-10-CM | POA: Diagnosis not present

## 2018-05-19 DIAGNOSIS — Z79891 Long term (current) use of opiate analgesic: Secondary | ICD-10-CM | POA: Diagnosis not present

## 2018-05-19 DIAGNOSIS — K5641 Fecal impaction: Secondary | ICD-10-CM | POA: Diagnosis not present

## 2018-05-19 DIAGNOSIS — L989 Disorder of the skin and subcutaneous tissue, unspecified: Secondary | ICD-10-CM | POA: Diagnosis not present

## 2018-05-19 DIAGNOSIS — S90822A Blister (nonthermal), left foot, initial encounter: Secondary | ICD-10-CM | POA: Diagnosis not present

## 2018-05-23 DIAGNOSIS — S72011D Unspecified intracapsular fracture of right femur, subsequent encounter for closed fracture with routine healing: Secondary | ICD-10-CM | POA: Diagnosis not present

## 2018-05-23 DIAGNOSIS — L989 Disorder of the skin and subcutaneous tissue, unspecified: Secondary | ICD-10-CM | POA: Diagnosis not present

## 2018-05-23 DIAGNOSIS — W19XXXD Unspecified fall, subsequent encounter: Secondary | ICD-10-CM | POA: Diagnosis not present

## 2018-05-24 DIAGNOSIS — W19XXXD Unspecified fall, subsequent encounter: Secondary | ICD-10-CM | POA: Diagnosis not present

## 2018-05-24 DIAGNOSIS — L989 Disorder of the skin and subcutaneous tissue, unspecified: Secondary | ICD-10-CM | POA: Diagnosis not present

## 2018-05-24 DIAGNOSIS — S72011D Unspecified intracapsular fracture of right femur, subsequent encounter for closed fracture with routine healing: Secondary | ICD-10-CM | POA: Diagnosis not present

## 2018-05-29 DIAGNOSIS — I5033 Acute on chronic diastolic (congestive) heart failure: Secondary | ICD-10-CM | POA: Diagnosis not present

## 2018-05-29 DIAGNOSIS — L8990 Pressure ulcer of unspecified site, unspecified stage: Secondary | ICD-10-CM | POA: Diagnosis not present

## 2018-05-29 DIAGNOSIS — E46 Unspecified protein-calorie malnutrition: Secondary | ICD-10-CM | POA: Diagnosis not present

## 2018-05-29 DIAGNOSIS — J449 Chronic obstructive pulmonary disease, unspecified: Secondary | ICD-10-CM | POA: Diagnosis not present

## 2018-05-30 DIAGNOSIS — S72011D Unspecified intracapsular fracture of right femur, subsequent encounter for closed fracture with routine healing: Secondary | ICD-10-CM | POA: Diagnosis not present

## 2018-05-30 DIAGNOSIS — W19XXXD Unspecified fall, subsequent encounter: Secondary | ICD-10-CM | POA: Diagnosis not present

## 2018-05-30 DIAGNOSIS — L989 Disorder of the skin and subcutaneous tissue, unspecified: Secondary | ICD-10-CM | POA: Diagnosis not present

## 2018-05-31 DIAGNOSIS — W19XXXD Unspecified fall, subsequent encounter: Secondary | ICD-10-CM | POA: Diagnosis not present

## 2018-05-31 DIAGNOSIS — S72011D Unspecified intracapsular fracture of right femur, subsequent encounter for closed fracture with routine healing: Secondary | ICD-10-CM | POA: Diagnosis not present

## 2018-05-31 DIAGNOSIS — L989 Disorder of the skin and subcutaneous tissue, unspecified: Secondary | ICD-10-CM | POA: Diagnosis not present

## 2018-06-01 DIAGNOSIS — L989 Disorder of the skin and subcutaneous tissue, unspecified: Secondary | ICD-10-CM | POA: Diagnosis not present

## 2018-06-01 DIAGNOSIS — S72011D Unspecified intracapsular fracture of right femur, subsequent encounter for closed fracture with routine healing: Secondary | ICD-10-CM | POA: Diagnosis not present

## 2018-06-01 DIAGNOSIS — W19XXXD Unspecified fall, subsequent encounter: Secondary | ICD-10-CM | POA: Diagnosis not present

## 2018-06-03 ENCOUNTER — Emergency Department (HOSPITAL_COMMUNITY)
Admission: EM | Admit: 2018-06-03 | Discharge: 2018-06-03 | Disposition: A | Discharge status: Home / Self Care | Payer: Medicare Other | Attending: Emergency Medicine | Admitting: Emergency Medicine

## 2018-06-03 ENCOUNTER — Other Ambulatory Visit: Payer: Self-pay

## 2018-06-03 ENCOUNTER — Encounter (HOSPITAL_COMMUNITY): Payer: Self-pay

## 2018-06-03 ENCOUNTER — Emergency Department (HOSPITAL_COMMUNITY): Payer: Medicare Other

## 2018-06-03 DIAGNOSIS — R0602 Shortness of breath: Secondary | ICD-10-CM | POA: Insufficient documentation

## 2018-06-03 DIAGNOSIS — F17220 Nicotine dependence, chewing tobacco, uncomplicated: Secondary | ICD-10-CM | POA: Insufficient documentation

## 2018-06-03 DIAGNOSIS — M7989 Other specified soft tissue disorders: Secondary | ICD-10-CM | POA: Diagnosis not present

## 2018-06-03 DIAGNOSIS — L89891 Pressure ulcer of other site, stage 1: Secondary | ICD-10-CM | POA: Insufficient documentation

## 2018-06-03 DIAGNOSIS — J449 Chronic obstructive pulmonary disease, unspecified: Secondary | ICD-10-CM | POA: Insufficient documentation

## 2018-06-03 DIAGNOSIS — R600 Localized edema: Secondary | ICD-10-CM | POA: Insufficient documentation

## 2018-06-03 DIAGNOSIS — R609 Edema, unspecified: Secondary | ICD-10-CM | POA: Diagnosis not present

## 2018-06-03 DIAGNOSIS — L89621 Pressure ulcer of left heel, stage 1: Secondary | ICD-10-CM | POA: Diagnosis not present

## 2018-06-03 DIAGNOSIS — R404 Transient alteration of awareness: Secondary | ICD-10-CM | POA: Diagnosis not present

## 2018-06-03 DIAGNOSIS — R2243 Localized swelling, mass and lump, lower limb, bilateral: Secondary | ICD-10-CM | POA: Diagnosis present

## 2018-06-03 LAB — CBC WITH DIFFERENTIAL/PLATELET
Abs Immature Granulocytes: 0.06 10*3/uL (ref 0.00–0.07)
Basophils Absolute: 0 10*3/uL (ref 0.0–0.1)
Basophils Relative: 0 %
Eosinophils Absolute: 0.6 10*3/uL — ABNORMAL HIGH (ref 0.0–0.5)
Eosinophils Relative: 5 %
HCT: 28.6 % — ABNORMAL LOW (ref 36.0–46.0)
Hemoglobin: 8.5 g/dL — ABNORMAL LOW (ref 12.0–15.0)
Immature Granulocytes: 1 %
Lymphocytes Relative: 11 %
Lymphs Abs: 1.3 10*3/uL (ref 0.7–4.0)
MCH: 24.9 pg — ABNORMAL LOW (ref 26.0–34.0)
MCHC: 29.7 g/dL — ABNORMAL LOW (ref 30.0–36.0)
MCV: 83.6 fL (ref 80.0–100.0)
Monocytes Absolute: 1 10*3/uL (ref 0.1–1.0)
Monocytes Relative: 8 %
Neutro Abs: 9.4 10*3/uL — ABNORMAL HIGH (ref 1.7–7.7)
Neutrophils Relative %: 75 %
Platelets: 572 10*3/uL — ABNORMAL HIGH (ref 150–400)
RBC: 3.42 MIL/uL — ABNORMAL LOW (ref 3.87–5.11)
RDW: 14.7 % (ref 11.5–15.5)
WBC: 12.5 10*3/uL — ABNORMAL HIGH (ref 4.0–10.5)
nRBC: 0 % (ref 0.0–0.2)

## 2018-06-03 LAB — URINALYSIS, ROUTINE W REFLEX MICROSCOPIC
Bilirubin Urine: NEGATIVE
Glucose, UA: NEGATIVE mg/dL
Hgb urine dipstick: NEGATIVE
Ketones, ur: NEGATIVE mg/dL
Leukocytes,Ua: NEGATIVE
Nitrite: NEGATIVE
Protein, ur: NEGATIVE mg/dL
Specific Gravity, Urine: 1.005 (ref 1.005–1.030)
pH: 5 (ref 5.0–8.0)

## 2018-06-03 LAB — COMPREHENSIVE METABOLIC PANEL
ALT: 14 U/L (ref 0–44)
AST: 15 U/L (ref 15–41)
Albumin: 2.1 g/dL — ABNORMAL LOW (ref 3.5–5.0)
Alkaline Phosphatase: 145 U/L — ABNORMAL HIGH (ref 38–126)
Anion gap: 10 (ref 5–15)
BUN: 17 mg/dL (ref 8–23)
CO2: 28 mmol/L (ref 22–32)
Calcium: 8 mg/dL — ABNORMAL LOW (ref 8.9–10.3)
Chloride: 96 mmol/L — ABNORMAL LOW (ref 98–111)
Creatinine, Ser: 0.62 mg/dL (ref 0.44–1.00)
GFR calc Af Amer: >60 mL/min (ref >60)
GFR calc non Af Amer: >60 mL/min (ref >60)
Glucose, Bld: 93 mg/dL (ref 70–99)
Potassium: 4.2 mmol/L (ref 3.5–5.1)
Sodium: 134 mmol/L — ABNORMAL LOW (ref 135–145)
Total Bilirubin: 0.2 mg/dL — ABNORMAL LOW (ref 0.3–1.2)
Total Protein: 6.4 g/dL — ABNORMAL LOW (ref 6.5–8.1)

## 2018-06-03 LAB — BRAIN NATRIURETIC PEPTIDE: B Natriuretic Peptide: 126 pg/mL — ABNORMAL HIGH (ref 0.0–100.0)

## 2018-06-03 NOTE — ED Notes (Addendum)
Pt unable to to void in bed pain . Placed on purewick

## 2018-06-03 NOTE — Discharge Instructions (Addendum)
We have treated pressure sores of the back and left foot, with a bandage that is intended to stay on for 4 or 5 days.  After that take the bandages off, clean the wounds and arrange for further care of the wounds with her PCP.  We have supplied phone heel protectors to use intermittently, when she is lying down to help keep pressure off her heels.  To treat the leg swelling, apply compression stockings intermittently throughout the day for several hours at a time.  Also make sure her feet are elevated above her heart is much as possible.  Make sure that you are staying on a low-salt diet to help prevent swelling.  Continue taking your current prescribed medications including furosemide for swelling.  Return here, if needed, for problems.

## 2018-06-03 NOTE — ED Notes (Signed)
Called [facility] for mepilex and heel protector

## 2018-06-03 NOTE — ED Notes (Signed)
Patient transported to X-ray 

## 2018-06-03 NOTE — ED Notes (Signed)
Pt heels placed on heel protector boots. rn also applied mepilex boarder dressing on each heel, pointy area of spine, and sacrum for added protection from pressure ulcers

## 2018-06-03 NOTE — ED Provider Notes (Signed)
[facility] EMERGENCY DEPARTMENT Provider Note   CSN: [id number] Arrival date & time: 06/03/18  1755    History   Chief Complaint Chief Complaint  Patient presents with   Leg Swelling   Pressure Ulcer    HPI [name] is a 84 y.o. female.     HPI   She presents for evaluation of intermittent but worsening lower leg swelling, for several weeks.  She states her PCP gave her a diuretic which helped initially but now is not working.  She has not tried compression stockings.  She is also concerned about sores on both heels.  They have last week.  She denies shortness of breath, chest pain, nausea, vomiting, weakness or dizziness.  There are no other known modifying factors.  Past Medical History:  Diagnosis Date   Anxiety    COPD (chronic obstructive pulmonary disease) (HCC)    GERD (gastroesophageal reflux disease)    Hyperlipidemia    IBS (irritable bowel syndrome)    Osteoporosis     Patient Active Problem List   Diagnosis Date Noted   S/P internal fixation right hip fracture cannulated screw placement 04/06/18 04/27/2018   Urinary tract infection associated with catheterization of urinary tract ([place]) 04/11/2018   Altered mental status 04/10/2018   Anxiety 04/10/2018   Hyponatremia 04/10/2018   UTI (urinary tract infection) due to urinary indwelling catheter ([place]) 04/10/2018   Constipation 04/09/2018   Urinary retention 04/09/2018   Closed right hip fracture ([place]) 04/05/2018   GERD (gastroesophageal reflux disease) 04/05/2018   COPD (chronic obstructive pulmonary disease) ([place]) 04/05/2018   Hiatal hernia    Dyspepsia 04/04/2014   Hematochezia 04/30/2011   RUQ pain 03/25/2011   Adenomatous polyps 03/25/2011   ABDOMINAL PAIN, LEFT LOWER QUADRANT 10/16/2009   HYPERLIPIDEMIA 10/09/2009   BRONCHITIS 10/09/2009   Osteoporosis 10/09/2009    Past Surgical History:  Procedure Laterality Date   COLONOSCOPY  10/2009   sigmoid  diverticula, multiple tubulovillous adneomas, needs surveillance Oct 2013   ESOPHAGOGASTRODUODENOSCOPY  04/16/2011   Dr. Gala Romney: Noncritical Schatzkis ring( not manipulated because no dysphagia). Normal esophagus otherwise  large hiatal hernia. Gastric polyp-status post biopsy. Gastric erosions-staus post biopsy. Abnormal bulb-status post biopsy. benign small bowel, stomach biopsy with ulcerated gastric antral mucosa with foveolar hyperplasia and surface erosion, polyp with inflamed gastric antral mucosa    ESOPHAGOGASTRODUODENOSCOPY N/A 04/29/2014   Dr. Gala Romney: Noncritical Schatzki's ring large hiatal hernia. Retained gastric contents.GES with slight delay   HIP PINNING,CANNULATED Right 04/06/2018   Procedure: CANNULATED HIP PINNING;  Surgeon: Carole Civil, MD;  Location: [facility];  Service: Orthopedics;  Laterality: Right;     OB History    Gravida  3   Para  3   Term  3   Preterm  0   AB  0   Living        SAB  0   TAB  0   Ectopic  0   Multiple      Live Births               Home Medications    Prior to Admission medications   Medication Sig Start Date End Date Taking? Authorizing Provider  albuterol (PROAIR HFA) 108 (90 Base) MCG/ACT inhaler Inhale 2 puffs into the lungs every 4 (four) hours as needed (4-6 hours).     [provider]  albuterol (PROVENTIL) (2.5 MG/3ML) 0.083% nebulizer solution Take 2.5 mg by nebulization every 6 (six) hours as needed for wheezing  or shortness of breath.    [provider]  ALPRAZolam Duanne Moron) 1 MG tablet Take 0.5 tablets (0.5 mg total) by mouth 3 (three) times daily as needed for anxiety. Patient taking differently: Take 1 mg by mouth 3 (three) times daily as needed for anxiety.  04/09/18   [name], MD  ceFEPime (MAXIPIME) IVPB Inject 2 g into the vein daily. Indication:  UTI Last Day of Therapy:  4/15 Labs - Once weekly:  CBC/D and BMP, Labs - Every other week:  ESR and CRP 04/15/18   [name],  MD  cetirizine (ZYRTEC) 10 MG tablet Take 10 mg by mouth daily.    [provider]  docusate sodium (COLACE) 100 MG capsule Take 1 capsule (100 mg total) by mouth 2 (two) times daily. 04/09/18   [name], MD  furosemide (LASIX) 40 MG tablet Take 40 mg by mouth daily.    [provider]  pantoprazole (PROTONIX) 40 MG tablet Take 1 tablet (40 mg total) by mouth 2 (two) times daily before a meal. 05/03/18   Annitta Needs, NP  potassium chloride (K-DUR) 10 MEQ tablet Take 10 mEq by mouth daily.    [provider]  traMADol (ULTRAM) 50 MG tablet Take 100 mg by mouth every 4 (four) hours as needed for moderate pain or severe pain.     [provider]  umeclidinium-vilanterol (ANORO ELLIPTA) 62.5-25 MCG/INH AEPB Inhale 1 puff into the lungs daily.    [provider]  VOLTAREN 1 % GEL Apply 1 g topically daily as needed. pain 07/01/14   [provider]  zolpidem (AMBIEN) 5 MG tablet Take 5 mg by mouth at bedtime.     [provider]    Family History Family History  Problem Relation Age of Onset   Stroke Mother    Diabetes Mother    Heart disease Father    Colon cancer Neg Hx     Social History Social History   Tobacco Use   Smoking status: Never Smoker   Smokeless tobacco: Current User    Types: Snuff  Substance Use Topics   Alcohol use: No   Drug use: No     Allergies   Codeine; Codeine; Levofloxacin; Sulfonamide derivatives; and Sulfa antibiotics   Review of Systems Review of Systems  All other systems reviewed and are negative.    Physical Exam Updated Vital Signs BP 102/81    Pulse 85    Temp 97.8 F (36.6 C) (Oral)    Resp 14    Wt 59.4 kg    SpO2 100%    BMI 23.21 kg/m   Physical Exam Vitals signs and nursing note reviewed.  Constitutional:      General: She is not in acute distress.    Appearance: She is well-developed and normal weight. She is not ill-appearing, toxic-appearing or  diaphoretic.     Comments: Elderly, frail  HENT:     Head: Normocephalic and atraumatic.     Right Ear: External ear normal.     Left Ear: External ear normal.  Eyes:     Conjunctiva/sclera: Conjunctivae normal.     Pupils: Pupils are equal, round, and reactive to light.  Neck:     Musculoskeletal: Normal range of motion and neck supple.     Trachea: Phonation normal.  Cardiovascular:     Rate and Rhythm: Normal rate and regular rhythm.     Heart sounds: Normal heart sounds.  Pulmonary:  Effort: Pulmonary effort is normal.     Breath sounds: Normal breath sounds.  Abdominal:     Palpations: Abdomen is soft.     Tenderness: There is no abdominal tenderness.  Musculoskeletal:     Comments: Kyphotic thorax.  Nontender spine.  Fair mobility both arms and legs somewhat limited right hip secondary to recent surgery.  Bilateral lower leg swelling, 2-3+.  This extends to the feet.  Skin:    General: Skin is warm and dry.     Comments: Left heel with 1/2 cm flat area of superficial eschar, consistent with stage I pressure sore.  There is no associated fluctuance, drainage or bleeding.  Neurological:     Mental Status: She is alert and oriented to person, place, and time.     Cranial Nerves: No cranial nerve deficit.     Sensory: No sensory deficit.     Motor: No abnormal muscle tone.     Coordination: Coordination normal.  Psychiatric:        Mood and Affect: Mood normal.        Behavior: Behavior normal.        Thought Content: Thought content normal.        Judgment: Judgment normal.      ED Treatments / Results  Labs (all labs ordered are listed, but only abnormal results are displayed) Labs Reviewed  COMPREHENSIVE METABOLIC PANEL - Abnormal; Notable for the following components:      Result Value   Sodium 134 (*)    Chloride 96 (*)    Calcium 8.0 (*)    Total Protein 6.4 (*)    Albumin 2.1 (*)    Alkaline Phosphatase 145 (*)    Total Bilirubin 0.2 (*)    All other  components within normal limits  CBC WITH DIFFERENTIAL/PLATELET - Abnormal; Notable for the following components:   WBC 12.5 (*)    RBC 3.42 (*)    Hemoglobin 8.5 (*)    HCT 28.6 (*)    MCH 24.9 (*)    MCHC 29.7 (*)    Platelets 572 (*)    Neutro Abs 9.4 (*)    Eosinophils Absolute 0.6 (*)    All other components within normal limits  BRAIN NATRIURETIC PEPTIDE - Abnormal; Notable for the following components:   B Natriuretic Peptide 126.0 (*)    All other components within normal limits  URINALYSIS, ROUTINE W REFLEX MICROSCOPIC - Abnormal; Notable for the following components:   Color, Urine STRAW (*)    All other components within normal limits    EKG None  Radiology Dg Chest 2 View  Result Date: 06/03/2018 CLINICAL DATA:  Lower extremity edema.  Shortness of breath. EXAM: CHEST - 2 VIEW COMPARISON:  04/10/2018 FINDINGS: There is hyperinflation of the lungs compatible with COPD. Cardiomegaly with vascular congestion and small effusions. No overt edema or confluent opacity. No acute bony abnormality. Stable mild compression deformities in the midthoracic spine. IMPRESSION: COPD. Cardiomegaly with vascular congestion and small effusions. Electronically Signed   By: Rolm Baptise M.D.   On: 06/03/2018 18:53    Procedures Procedures (including critical care time)  Medications Ordered in ED Medications - No data to display   Initial Impression / Assessment and Plan / ED Course  I have reviewed the triage vital signs and the nursing notes.  Pertinent labs & imaging results that were available during my care of the patient were reviewed by me and considered in my medical decision making (see chart  for details).  Clinical Course as of Jun 03 2154  Sat Jun 03, 2018  1901 I was able to talk to the patient's daughter [name], who gave additional information.  She states that Dr. Luan Pulling has been working on pressure sores on the patient's back and left heel, with observation, and  antibiotics.  Apparently, the home health nurse felt that she should stop using compression stockings about a week ago because of the sore on her left heel.  Dr. Luan Pulling agreed with this measure.  Also, he recently increased her daily Lasix dose from 40 to 80 mg but she still has swelling which is worsening according to the daughter.   [EW]  2149 Normal  Urinalysis, Routine w reflex microscopic(!) [EW]  2149 Normal except white count high, hemoglobin low, platelets high  CBC with Differential(!) [EW]  2150 Normal except sodium low, chloride low, calcium low, total protein low, albumin low, alkaline phosphatase high, total bilirubin low  Comprehensive metabolic panel(!) [EW]  1696 Mild elevation  Brain natriuretic peptide(!) [EW]  2150 No CHF or infiltrate, images reviewed by me  DG Chest 2 View [EW]    Clinical Course User Index [EW] Daleen Bo, MD        Patient Vitals for the past 24 hrs:  BP Temp Temp src Pulse Resp SpO2 Weight  06/03/18 2100 102/81 -- -- -- 14 -- --  06/03/18 2030 (!) 102/57 -- -- -- 11 -- --  06/03/18 2015 -- -- -- -- 12 -- --  06/03/18 2000 104/62 -- -- -- -- -- --  06/03/18 1930 100/74 -- -- -- -- -- --  06/03/18 1915 -- -- -- -- 13 -- --  06/03/18 1900 117/72 -- -- -- 15 -- --  06/03/18 1845 -- -- -- -- 17 -- --  06/03/18 1830 104/64 -- -- 85 18 100 % --  06/03/18 1815 -- -- -- 83 16 99 % --  06/03/18 1805 (!) 105/57 97.8 F (36.6 C) Oral 87 16 99 % --  06/03/18 1803 -- -- -- -- -- -- 59.4 kg    9:56 PM Reevaluation with update and discussion. After initial assessment and treatment, an updated evaluation reveals she is comfortable, eating, and desires to go home.  I had a second conversation with the patient's daughter, [name].  She understands that the patient is stable for discharge and she will work on getting compression stockings back on the patient.  We discussed wound care.  All questions answered. Daleen Bo   Medical Decision Making:  Peripheral edema, with mild pressure sores, mid back, and left heel.  Pressure sores were treated in the emergency department with dressings.  These dressings are intended to stay on for several days, then be reapplied as needed.  Patient was also given heel protector foam pads to wear, as needed for comfort and to help elevate the lower legs.  There is no sign of acute congestive heart failure, metabolic instability or suggestion for bacterial infection.  There is no indication for hospitalization at this time  CRITICAL CARE-no Performed by: Daleen Bo  Nursing Notes Reviewed/ Care Coordinated Applicable Imaging Reviewed Interpretation of Laboratory Data incorporated into ED treatment  The patient appears reasonably screened and/or stabilized for discharge and I doubt any other medical condition or other [facility] requiring further screening, evaluation, or treatment in the ED at this time prior to discharge.  Plan: Home Medications-continue current; Home Treatments-elevate legs as much as possible, use compression stockings and heel protectors; return  here if the recommended treatment, does not improve the symptoms; Recommended follow up-PCP follow-up as soon as possible for further care and management.  Consider referral to wound management regarding stage I pressure sores of back, and left heel.    Final Clinical Impressions(s) / ED Diagnoses   Final diagnoses:  Peripheral edema  Pressure injury of left foot, stage 1    ED Discharge Orders    None       Daleen Bo, MD 06/03/18 2156

## 2018-06-03 NOTE — ED Notes (Signed)
Pt stated she "addicentally soaked the bed" before the purewick was placed.

## 2018-06-03 NOTE — ED Notes (Signed)
Pt noted to have Kyphosis of spine. Band aid noted to right mid back. Redness noted . Also open blister like area to left medial heel

## 2018-06-03 NOTE — ED Triage Notes (Signed)
Pt brought in by [facility] due to bilateral pitting edema in lower ext., intermittent SOB and sores to left heel and back. Pt fell one month ago and broke right hip and since family reports increase swelling

## 2018-06-03 NOTE — ED Notes (Signed)
Attempt to call [facility] for pressure ulcer drsg. Will try to call again.

## 2018-06-05 DIAGNOSIS — J449 Chronic obstructive pulmonary disease, unspecified: Secondary | ICD-10-CM | POA: Diagnosis not present

## 2018-06-05 DIAGNOSIS — F419 Anxiety disorder, unspecified: Secondary | ICD-10-CM | POA: Diagnosis not present

## 2018-06-05 DIAGNOSIS — R609 Edema, unspecified: Secondary | ICD-10-CM | POA: Diagnosis not present

## 2018-06-08 DIAGNOSIS — S72011D Unspecified intracapsular fracture of right femur, subsequent encounter for closed fracture with routine healing: Secondary | ICD-10-CM | POA: Diagnosis not present

## 2018-06-08 DIAGNOSIS — W19XXXD Unspecified fall, subsequent encounter: Secondary | ICD-10-CM | POA: Diagnosis not present

## 2018-06-08 DIAGNOSIS — L989 Disorder of the skin and subcutaneous tissue, unspecified: Secondary | ICD-10-CM | POA: Diagnosis not present

## 2018-06-09 ENCOUNTER — Ambulatory Visit (INDEPENDENT_AMBULATORY_CARE_PROVIDER_SITE_OTHER): Payer: Medicare Other

## 2018-06-09 ENCOUNTER — Encounter: Payer: Self-pay | Admitting: Orthopedic Surgery

## 2018-06-09 ENCOUNTER — Other Ambulatory Visit: Payer: Self-pay

## 2018-06-09 ENCOUNTER — Ambulatory Visit (INDEPENDENT_AMBULATORY_CARE_PROVIDER_SITE_OTHER): Payer: Medicare Other | Admitting: Orthopedic Surgery

## 2018-06-09 VITALS — Temp 97.8°F | Ht 63.0 in | Wt 131.0 lb

## 2018-06-09 DIAGNOSIS — L989 Disorder of the skin and subcutaneous tissue, unspecified: Secondary | ICD-10-CM | POA: Diagnosis not present

## 2018-06-09 DIAGNOSIS — Z8781 Personal history of (healed) traumatic fracture: Secondary | ICD-10-CM

## 2018-06-09 DIAGNOSIS — W19XXXD Unspecified fall, subsequent encounter: Secondary | ICD-10-CM | POA: Diagnosis not present

## 2018-06-09 DIAGNOSIS — M7711 Lateral epicondylitis, right elbow: Secondary | ICD-10-CM | POA: Diagnosis not present

## 2018-06-09 DIAGNOSIS — S72011D Unspecified intracapsular fracture of right femur, subsequent encounter for closed fracture with routine healing: Secondary | ICD-10-CM | POA: Diagnosis not present

## 2018-06-09 DIAGNOSIS — Z9889 Other specified postprocedural states: Secondary | ICD-10-CM | POA: Diagnosis not present

## 2018-06-09 MED ORDER — HYDROCODONE-ACETAMINOPHEN 5-325 MG PO TABS: 1.0000 | ORAL_TABLET | Freq: Four times a day (QID) | ORAL | 0 refills | Status: DC | PRN

## 2018-06-09 NOTE — Progress Notes (Signed)
Chief Complaint  Patient presents with  . Routine Post Op//  DOS 04/06/2018; ORIF RT HIP    Cannulated screw fixation right hip  [name] complains of lower back pain radiating to the right hip and right elbow pain lateral aspect new onset after discharge from the hospital.  Hospital course complicated by UTI E. coli infection  No problems with the hip at this time  She is having pain in her lower back right elbow she says she has been trying to switch from tramadol to Tylenol unsuccessfully  She has a codeine allergy which she says she gets anaphylaxis and shortness of breath but her daughter does not think she is allergic to hydrocodone.  So we discussed this and decided to try hydrocodone with Benadryl available on standby  Review of Systems  Constitutional: Positive for fever.  Skin:       Right heel ulcer     Physical Exam Musculoskeletal:       Arms:    Encounter Diagnoses  Name Primary?  . S/P internal fixation right hip fracture cannulated screw placement 04/06/18   . Right tennis elbow Yes    Right hip fracture x-ray in 4 weeks  Right tennis elbow injection Procedure note injection for right tennis elbow   Diagnosis right tennis elbow  Anesthesia ethyl chloride was used Alcohol use is clean the skin  After we obtained verbal consent and timeout a 25-gauge needle was used to inject 40 mg of Depo-Medrol and 3 cc of 1% lidocaine just distal to the insertion of the ECRB  There were no complications and a sterile bandage was applied.   Meds ordered this encounter  Medications  . HYDROcodone-acetaminophen (NORCO/VICODIN) 5-325 MG tablet    Sig: Take 1 tablet by mouth every 6 (six) hours as needed for up to 7 days for moderate pain.    Dispense:  28 tablet    Refill:  0

## 2018-06-11 DIAGNOSIS — Z1159 Encounter for screening for other viral diseases: Secondary | ICD-10-CM | POA: Diagnosis not present

## 2018-06-14 DIAGNOSIS — W19XXXD Unspecified fall, subsequent encounter: Secondary | ICD-10-CM | POA: Diagnosis not present

## 2018-06-14 DIAGNOSIS — L989 Disorder of the skin and subcutaneous tissue, unspecified: Secondary | ICD-10-CM | POA: Diagnosis not present

## 2018-06-14 DIAGNOSIS — S72011D Unspecified intracapsular fracture of right femur, subsequent encounter for closed fracture with routine healing: Secondary | ICD-10-CM | POA: Diagnosis not present

## 2018-06-16 ENCOUNTER — Non-Acute Institutional Stay (SKILLED_NURSING_FACILITY): Payer: Medicare Other | Admitting: Adult Health

## 2018-06-16 ENCOUNTER — Inpatient Hospital Stay
Admission: RE | Admit: 2018-06-16 | Discharge: 2018-06-25 | Disposition: A | Discharge status: Home / Self Care | Payer: Medicare Other | Source: Ambulatory Visit | Attending: Internal Medicine | Admitting: Internal Medicine

## 2018-06-16 ENCOUNTER — Encounter: Payer: Self-pay | Admitting: Adult Health

## 2018-06-16 ENCOUNTER — Other Ambulatory Visit: Payer: Self-pay | Admitting: Adult Health

## 2018-06-16 DIAGNOSIS — R41841 Cognitive communication deficit: Secondary | ICD-10-CM | POA: Diagnosis not present

## 2018-06-16 DIAGNOSIS — S72001S Fracture of unspecified part of neck of right femur, sequela: Secondary | ICD-10-CM | POA: Diagnosis not present

## 2018-06-16 DIAGNOSIS — D72829 Elevated white blood cell count, unspecified: Secondary | ICD-10-CM | POA: Diagnosis not present

## 2018-06-16 DIAGNOSIS — S72001D Fracture of unspecified part of neck of right femur, subsequent encounter for closed fracture with routine healing: Secondary | ICD-10-CM | POA: Diagnosis not present

## 2018-06-16 DIAGNOSIS — J90 Pleural effusion, not elsewhere classified: Secondary | ICD-10-CM | POA: Diagnosis not present

## 2018-06-16 DIAGNOSIS — K219 Gastro-esophageal reflux disease without esophagitis: Secondary | ICD-10-CM

## 2018-06-16 DIAGNOSIS — J189 Pneumonia, unspecified organism: Secondary | ICD-10-CM | POA: Diagnosis not present

## 2018-06-16 DIAGNOSIS — F0390 Unspecified dementia without behavioral disturbance: Secondary | ICD-10-CM | POA: Diagnosis not present

## 2018-06-16 DIAGNOSIS — Z8781 Personal history of (healed) traumatic fracture: Secondary | ICD-10-CM

## 2018-06-16 DIAGNOSIS — R339 Retention of urine, unspecified: Secondary | ICD-10-CM

## 2018-06-16 DIAGNOSIS — D649 Anemia, unspecified: Secondary | ICD-10-CM | POA: Diagnosis not present

## 2018-06-16 DIAGNOSIS — Z741 Need for assistance with personal care: Secondary | ICD-10-CM | POA: Diagnosis not present

## 2018-06-16 DIAGNOSIS — Z9889 Other specified postprocedural states: Secondary | ICD-10-CM | POA: Diagnosis not present

## 2018-06-16 DIAGNOSIS — E43 Unspecified severe protein-calorie malnutrition: Secondary | ICD-10-CM

## 2018-06-16 DIAGNOSIS — R05 Cough: Secondary | ICD-10-CM | POA: Diagnosis not present

## 2018-06-16 DIAGNOSIS — R600 Localized edema: Secondary | ICD-10-CM

## 2018-06-16 DIAGNOSIS — F419 Anxiety disorder, unspecified: Secondary | ICD-10-CM | POA: Diagnosis not present

## 2018-06-16 DIAGNOSIS — J42 Unspecified chronic bronchitis: Secondary | ICD-10-CM

## 2018-06-16 DIAGNOSIS — M6281 Muscle weakness (generalized): Secondary | ICD-10-CM | POA: Diagnosis not present

## 2018-06-16 DIAGNOSIS — R262 Difficulty in walking, not elsewhere classified: Secondary | ICD-10-CM | POA: Diagnosis not present

## 2018-06-16 DIAGNOSIS — J40 Bronchitis, not specified as acute or chronic: Principal | ICD-10-CM

## 2018-06-16 DIAGNOSIS — R918 Other nonspecific abnormal finding of lung field: Secondary | ICD-10-CM | POA: Diagnosis not present

## 2018-06-16 DIAGNOSIS — K5909 Other constipation: Secondary | ICD-10-CM | POA: Diagnosis not present

## 2018-06-16 DIAGNOSIS — M810 Age-related osteoporosis without current pathological fracture: Secondary | ICD-10-CM | POA: Diagnosis not present

## 2018-06-16 DIAGNOSIS — Z4789 Encounter for other orthopedic aftercare: Secondary | ICD-10-CM | POA: Diagnosis not present

## 2018-06-16 DIAGNOSIS — J449 Chronic obstructive pulmonary disease, unspecified: Secondary | ICD-10-CM

## 2018-06-16 DIAGNOSIS — Z9181 History of falling: Secondary | ICD-10-CM | POA: Diagnosis not present

## 2018-06-16 DIAGNOSIS — R4189 Other symptoms and signs involving cognitive functions and awareness: Secondary | ICD-10-CM | POA: Diagnosis not present

## 2018-06-16 DIAGNOSIS — F329 Major depressive disorder, single episode, unspecified: Secondary | ICD-10-CM | POA: Diagnosis not present

## 2018-06-16 DIAGNOSIS — F0150 Vascular dementia without behavioral disturbance: Secondary | ICD-10-CM | POA: Diagnosis not present

## 2018-06-16 MED ORDER — ALPRAZOLAM 0.5 MG PO TABS: 0.5000 mg | ORAL_TABLET | Freq: Three times a day (TID) | ORAL | 0 refills | Status: DC

## 2018-06-16 NOTE — Progress Notes (Signed)
Location:   [place] Room Number: 154 D Place of Service:  SNF (31)   CODE STATUS: Full Code  Allergies  Allergen Reactions  . Codeine Shortness Of Breath and Rash  . Codeine Anaphylaxis  . Levofloxacin Anaphylaxis    Patient was admitted to hospital with lung problems due to this medication  . Sulfonamide Derivatives Hives and Shortness Of Breath  . Sulfa Antibiotics Hives    Chief Complaint  Patient presents with  . Acute Visit    Transfer In    HPI:  She is a 84 year old woman who suffered a right hip fracture in April of this year. She did go home after her hospitalization. She did receive home health therapy. She is unable to meet her adl needs at this time. She is here for short term rehab with her goal to return back home. She denies any uncontrolled pain. She denies any insomnia; her appetite is good; she does have right lower extremity weakness present. She will continue to be followed for her chronic illnesses: copd; constipation gerd.   Past Medical History:  Diagnosis Date  . Anxiety   . COPD (chronic obstructive pulmonary disease) ([place])   . GERD (gastroesophageal reflux disease)   . Hyperlipidemia   . IBS (irritable bowel syndrome)   . Osteoporosis     Past Surgical History:  Procedure Laterality Date  . COLONOSCOPY  10/2009   sigmoid diverticula, multiple tubulovillous adneomas, needs surveillance Oct 2013  . ESOPHAGOGASTRODUODENOSCOPY  04/16/2011   Dr. Gala Romney: Noncritical Schatzkis ring( not manipulated because no dysphagia). Normal esophagus otherwise  large hiatal hernia. Gastric polyp-status post biopsy. Gastric erosions-staus post biopsy. Abnormal bulb-status post biopsy. benign small bowel, stomach biopsy with ulcerated gastric antral mucosa with foveolar hyperplasia and surface erosion, polyp with inflamed gastric antral mucosa   . ESOPHAGOGASTRODUODENOSCOPY N/A 04/29/2014   Dr. Gala Romney: Noncritical Schatzki's ring large  hiatal hernia. Retained gastric contents.GES with slight delay  . HIP PINNING,CANNULATED Right 04/06/2018   Procedure: CANNULATED HIP PINNING;  Surgeon: Carole Civil, MD;  Location: [facility];  Service: Orthopedics;  Laterality: Right;    Social History   Socioeconomic History  . Marital status: Married    Spouse name: Not on file  . Number of children: Not on file  . Years of education: Not on file  . Highest education level: Not on file  Occupational History  . Not on file  Social Needs  . Financial resource strain: Not on file  . Food insecurity    Worry: Not on file    Inability: Not on file  . Transportation needs    Medical: Not on file    Non-medical: Not on file  Tobacco Use  . Smoking status: Never Smoker  . Smokeless tobacco: Current User    Types: Snuff  Substance and Sexual Activity  . Alcohol use: No  . Drug use: No  . Sexual activity: Never  Lifestyle  . Physical activity    Days per week: Not on file    Minutes per session: Not on file  . Stress: Not on file  Relationships  . Social Herbalist on phone: Not on file    Gets together: Not on file    Attends religious service: Not on file    Active member of club or organization: Not on file    Attends meetings of clubs or organizations: Not on file    Relationship status: Not on  file  . Intimate partner violence    Fear of current or ex partner: Not on file    Emotionally abused: Not on file    Physically abused: Not on file    Forced sexual activity: Not on file  Other Topics Concern  . Not on file  Social History Narrative   ** Merged History Encounter **       Family History  Problem Relation Age of Onset  . Stroke Mother   . Diabetes Mother   . Heart disease Father   . Colon cancer Neg Hx       VITAL SIGNS BP 120/64   Pulse 68   Temp (!) 97.5 F (36.4 C)   Resp 18   Ht 5' 3"  (1.6 m)   Wt 125 lb (56.7 kg)   BMI 22.14 kg/m   Outpatient Encounter Medications as of  06/16/2018  Medication Sig   Xanax 0.5 mg  Three times daily    ambien 5 mg  Hs prn for 14 days   . albuterol (PROAIR HFA) 108 (90 Base) MCG/ACT inhaler Inhale 2 puffs into the lungs every 4 (four) hours as needed (4-6 hours).   Marland Kitchen albuterol (PROVENTIL) (2.5 MG/3ML) 0.083% nebulizer solution Take 2.5 mg by nebulization every 6 (six) hours as needed for wheezing or shortness of breath.  . furosemide (LASIX) 40 MG tablet Take 40 mg by mouth daily  . linaclotide (LINZESS) 145 MCG CAPS capsule Take 145 mcg by mouth daily as needed.  . pantoprazole (PROTONIX) 40 MG tablet Take 1 tablet (40 mg total) by mouth 2 (two) times daily before a meal.  . potassium chloride (K-DUR) 10 MEQ tablet Take 10 mEq by mouth daily  . tamsulosin (FLOMAX) 0.4 MG CAPS capsule Take 0.4 mg by mouth daily.  . traMADol (ULTRAM) 50 MG tablet Take 100 mg every 4 hours as needed   . umeclidinium-vilanterol (ANORO ELLIPTA) 62.5-25 MCG/INH AEPB Inhale 1 puff into the lungs daily.   No facility-administered encounter medications on file as of 06/16/2018.      SIGNIFICANT DIAGNOSTIC EXAMS  LABS REVIEWED TODAY:   06-03-18: wbc 12.5; hgb 8.5; hct 28.6; mcv 83.6; plt 572; glucose 93; bun 1; creat 0.62; k+ 4.2; na++ 134; ca 8.0; alk phos 145; albumin 2.1; BNP 126.0   Review of Systems  Constitutional: Negative for malaise/fatigue.  Respiratory: Negative for cough and shortness of breath.   Cardiovascular: Negative for chest pain, palpitations and leg swelling.  Gastrointestinal: Negative for abdominal pain, constipation and heartburn.  Musculoskeletal: Negative for back pain, joint pain and myalgias.  Skin: Negative.   Neurological: Negative for dizziness.  Psychiatric/Behavioral: The patient is not nervous/anxious.     Physical Exam Constitutional:      General: She is not in acute distress.    Appearance: She is well-developed. She is not diaphoretic.     Comments: thin  HENT:     Head:     Comments: HOH Neck:      Musculoskeletal: Neck supple.     Thyroid: No thyromegaly.  Cardiovascular:     Rate and Rhythm: Normal rate and regular rhythm.     Pulses: Normal pulses.     Heart sounds: Normal heart sounds.  Pulmonary:     Effort: Pulmonary effort is normal. No respiratory distress.     Breath sounds: Normal breath sounds.  Abdominal:     General: Bowel sounds are normal. There is no distension.     Palpations: Abdomen is soft.  Tenderness: There is no abdominal tenderness.  Musculoskeletal:     Right lower leg: No edema.     Left lower leg: No edema.     Comments: Is abe to move all extremities Is status post right hip cannulated nailing: 04-06-18  Lymphadenopathy:     Cervical: No cervical adenopathy.  Skin:    General: Skin is warm and dry.  Neurological:     Mental Status: She is alert and oriented to person, place, and time.  Psychiatric:        Mood and Affect: Mood normal.        ASSESSMENT/ PLAN:  TODAY;   1. COPD unspecified COPD type is stable will continue anoro 62.5/25 mcg 1 puff daily; albuterol 2 puffs every 4 hours as needed or neb every 6 hours as needed  2. Urinary retention: is stable will continue flomax 0.4 mg daily   3. GERD without esophagitis: is stable will continue protonix 40 mg twice daily   4. Chronic constipation: is stable will continue linzess 145 mcg daily   5. Bilateral lower extremity edema: is stable will continue lasix 40 mg daily with k+ 10 meq daily   6. Anxiety: is stable will continue xanax 0.5 mg three times daily and will continue ambien 5 mg nightly as needed for 14 days will monitor   7. Closed fracture of right hip sequela: is status post internal fixation right hip fracture cannulated screw placement: she is without change: will continue therapy as directed to improve her level of independence with her adls; will continue ultram 100 mg every 4 hours as needed   8. Protein calorie malnutrition severe: albumin 2.1 will being prostat  three times daily          MD is aware of resident's narcotic use and is in agreement with current plan of care. We will attempt to wean resident as apropriate   Ok Edwards NP [facility]  Contact [phone] Monday through Friday 8am- 5pm  After hours call [phone]

## 2018-06-18 ENCOUNTER — Encounter: Payer: Self-pay | Admitting: Adult Health

## 2018-06-18 DIAGNOSIS — E43 Unspecified severe protein-calorie malnutrition: Secondary | ICD-10-CM | POA: Insufficient documentation

## 2018-06-18 DIAGNOSIS — R600 Localized edema: Secondary | ICD-10-CM | POA: Insufficient documentation

## 2018-06-19 ENCOUNTER — Other Ambulatory Visit: Payer: Self-pay | Admitting: Adult Health

## 2018-06-19 ENCOUNTER — Non-Acute Institutional Stay (SKILLED_NURSING_FACILITY): Payer: Medicare Other | Admitting: Internal Medicine

## 2018-06-19 ENCOUNTER — Encounter: Payer: Self-pay | Admitting: Adult Health

## 2018-06-19 ENCOUNTER — Encounter: Payer: Self-pay | Admitting: Internal Medicine

## 2018-06-19 DIAGNOSIS — F0390 Unspecified dementia without behavioral disturbance: Secondary | ICD-10-CM | POA: Insufficient documentation

## 2018-06-19 DIAGNOSIS — R4189 Other symptoms and signs involving cognitive functions and awareness: Secondary | ICD-10-CM

## 2018-06-19 DIAGNOSIS — K219 Gastro-esophageal reflux disease without esophagitis: Secondary | ICD-10-CM | POA: Diagnosis not present

## 2018-06-19 DIAGNOSIS — Z8781 Personal history of (healed) traumatic fracture: Secondary | ICD-10-CM | POA: Diagnosis not present

## 2018-06-19 DIAGNOSIS — J449 Chronic obstructive pulmonary disease, unspecified: Secondary | ICD-10-CM | POA: Diagnosis not present

## 2018-06-19 DIAGNOSIS — F419 Anxiety disorder, unspecified: Secondary | ICD-10-CM

## 2018-06-19 DIAGNOSIS — R600 Localized edema: Secondary | ICD-10-CM | POA: Diagnosis not present

## 2018-06-19 DIAGNOSIS — Z9889 Other specified postprocedural states: Secondary | ICD-10-CM | POA: Diagnosis not present

## 2018-06-19 DIAGNOSIS — R339 Retention of urine, unspecified: Secondary | ICD-10-CM

## 2018-06-19 DIAGNOSIS — K5909 Other constipation: Secondary | ICD-10-CM

## 2018-06-19 NOTE — Progress Notes (Signed)
Entered in error

## 2018-06-19 NOTE — Progress Notes (Addendum)
Provider:  Veleta Miners, MD Location:  [place] Room Number: 154 D Place of Service:  SNF (31)  PCP: [name], MD Patient Care Team: [name], MD as PCP - General (Pulmonary Disease) [name], MD as PCP - Cardiology (Cardiology) Lacey [place] Cristopher Estimable, MD (Gastroenterology) [name], MD (Internal Medicine)  Extended Emergency Contact Information Primary Emergency Contact: [facility] Address: [address]          [place], [place] [address] [name] of [place] Phone: [phone] Relation: Daughter Secondary Emergency Contact: pulliam, frank Mobile Phone: [phone] Relation: None  Code Status: Full Code Goals of Care: Advanced Directive information Advanced Directives 06/19/2018  Does Patient Have a Medical Advance Directive? No  Would patient like information on creating a medical advance directive? No - Patient declined  Pre-existing out of facility DNR order (yellow form or pink MOST form) -      Chief Complaint  Patient presents with  . New Admit To SNF    Admission    HPI: Patient is a 84 y.o. female seen today for admission to SNF for therapy Patient was admitted from home for Short Term Rehab Patient has history of COPD, and GERD, hyperlipidemia status post right hip fracture in 4/20, by urinary retention and UTI  After her hip fracture in 4/20 patient refused to go for short-term rehab.  Since then she has been in the ED twice and her family has been unable to take care of her at home. She is here for a short-term rehab and plans to go back with her daughter. Patient denied any complaints today.  She wanted to know if she can use her nebs as needed for her COPD. She denied any chest pain, cough or fever Patient does want to go home.  She lives with her daughter.  According to the patient was using walker at home.  Was independent in her AD per Patient   Past Medical History:  Diagnosis Date  .  Anxiety   . COPD (chronic obstructive pulmonary disease) ([place])   . GERD (gastroesophageal reflux disease)   . Hyperlipidemia   . IBS (irritable bowel syndrome)   . Osteoporosis    Past Surgical History:  Procedure Laterality Date  . COLONOSCOPY  10/2009   sigmoid diverticula, multiple tubulovillous adneomas, needs surveillance Oct 2013  . ESOPHAGOGASTRODUODENOSCOPY  04/16/2011   Dr. Gala [place]: Noncritical Schatzkis ring( not manipulated because no dysphagia). Normal esophagus otherwise  large hiatal hernia. Gastric polyp-status post biopsy. Gastric erosions-staus post biopsy. Abnormal bulb-status post biopsy. benign small bowel, stomach biopsy with ulcerated gastric antral mucosa with foveolar hyperplasia and surface erosion, polyp with inflamed gastric antral mucosa   . ESOPHAGOGASTRODUODENOSCOPY N/A 04/29/2014   Dr. Gala [place]: Noncritical Schatzki's ring large hiatal hernia. Retained gastric contents.GES with slight delay  . HIP PINNING,CANNULATED Right 04/06/2018   Procedure: CANNULATED HIP PINNING;  Surgeon: Carole Civil, MD;  Location: [facility];  Service: Orthopedics;  Laterality: Right;    reports that she has never smoked. Her smokeless tobacco use includes snuff. She reports that she does not drink alcohol or use drugs. Social History   Socioeconomic History  . Marital status: Married    Spouse name: Not on file  . Number of children: Not on file  . Years of education: Not on file  . Highest education level: Not on file  Occupational History  . Not on file  Social Needs  . Financial resource strain: Not on file  .  Food insecurity    Worry: Not on file    Inability: Not on file  . Transportation needs    Medical: Not on file    Non-medical: Not on file  Tobacco Use  . Smoking status: Never Smoker  . Smokeless tobacco: Current User    Types: Snuff  Substance and Sexual Activity  . Alcohol use: No  . Drug use: No  . Sexual activity: Never  Lifestyle  . Physical activity     Days per week: Not on file    Minutes per session: Not on file  . Stress: Not on file  Relationships  . Social Herbalist on phone: Not on file    Gets together: Not on file    Attends religious service: Not on file    Active member of club or organization: Not on file    Attends meetings of clubs or organizations: Not on file    Relationship status: Not on file  . Intimate partner violence    Fear of current or ex partner: Not on file    Emotionally abused: Not on file    Physically abused: Not on file    Forced sexual activity: Not on file  Other Topics Concern  . Not on file  Social History Narrative   ** Merged History Encounter **        Functional Status Survey:    Family History  Problem Relation Age of Onset  . Stroke Mother   . Diabetes Mother   . Heart disease Father   . Colon cancer Neg Hx     Health Maintenance  Topic Date Due  . PNA vac Low Risk Adult (1 of 2 - PCV13) 07/16/2018 (Originally 10/12/2001)  . TETANUS/TDAP  07/17/2018 (Originally 10/13/1953)  . INFLUENZA VACCINE  08/05/2018  . DEXA SCAN  Completed    Allergies  Allergen Reactions  . Codeine Shortness Of Breath and Rash  . Codeine Anaphylaxis  . Levofloxacin Anaphylaxis    Patient was admitted to hospital with lung problems due to this medication  . Sulfonamide Derivatives Hives and Shortness Of Breath  . Sulfa Antibiotics Hives    Outpatient Encounter Medications as of 06/19/2018  Medication Sig  . albuterol (PROAIR HFA) 108 (90 Base) MCG/ACT inhaler Inhale 2 puffs into the lungs every 4 (four) hours as needed (4-6 hours).   Marland Kitchen albuterol (PROVENTIL) (2.5 MG/3ML) 0.083% nebulizer solution Take 2.5 mg by nebulization every 6 (six) hours as needed for wheezing or shortness of breath.  . ALPRAZolam (XANAX) 0.5 MG tablet Take 1 tablet (0.5 mg total) by mouth 3 (three) times daily.  . Amino Acids-Protein Hydrolys (FEEDING SUPPLEMENT, PRO-STAT SUGAR FREE 64,) LIQD Take 30 mLs by mouth  3 (three) times daily with meals.  . cetirizine (ZYRTEC) 10 MG tablet Take 10 mg by mouth daily as needed for allergies.  . furosemide (LASIX) 40 MG tablet Take 40 mg by mouth daily as needed.   . linaclotide (LINZESS) 145 MCG CAPS capsule Take 145 mcg by mouth daily as needed.  . pantoprazole (PROTONIX) 40 MG tablet Take 1 tablet (40 mg total) by mouth 2 (two) times daily before a meal.  . potassium chloride (K-DUR) 10 MEQ tablet Take 10 mEq by mouth daily as needed.   . tamsulosin (FLOMAX) 0.4 MG CAPS capsule Take 0.4 mg by mouth daily.  . traMADol (ULTRAM) 50 MG tablet Take 100 mg by mouth every 4 (four) hours as needed for moderate pain or  severe pain.   Marland Kitchen umeclidinium-vilanterol (ANORO ELLIPTA) 62.5-25 MCG/INH AEPB Inhale 1 puff into the lungs daily.  Marland Kitchen zolpidem (AMBIEN) 5 MG tablet Take 5 mg by mouth at bedtime as needed for sleep. X 14 days   No facility-administered encounter medications on file as of 06/19/2018.     Review of Systems  Constitutional: Negative.   HENT: Negative.   Respiratory: Negative.   Cardiovascular: Positive for leg swelling.  Gastrointestinal: Negative.   Genitourinary: Negative.   Musculoskeletal: Positive for gait problem.  Neurological: Positive for weakness.  Psychiatric/Behavioral: Positive for confusion.    Vitals:   06/19/18 1046  BP: 116/62  Pulse: 84  Resp: 20  Temp: (!) 96.6 F (35.9 C)  Weight: 125 lb 1.6 oz (56.7 kg)  Height: 5\' 3"  (1.6 m)   Body mass index is 22.16 kg/m. Physical Exam Vitals signs reviewed.  Constitutional:      Appearance: Normal appearance.     Comments: Has Kyphosis  HENT:     Head: Normocephalic.     Nose: Nose normal.     Mouth/Throat:     Mouth: Mucous membranes are moist.     Pharynx: Oropharynx is clear.  Eyes:     Pupils: Pupils are equal, round, and reactive to light.  Neck:     Musculoskeletal: Neck supple.  Cardiovascular:     Rate and Rhythm: Normal rate and regular rhythm.     Pulses:  Normal pulses.     Heart sounds: Normal heart sounds.  Pulmonary:     Effort: Pulmonary effort is normal. No respiratory distress.     Breath sounds: Normal breath sounds. No wheezing.  Abdominal:     General: Abdomen is flat. There is no distension.     Palpations: Abdomen is soft.     Tenderness: There is no abdominal tenderness. There is no guarding.  Musculoskeletal:     Comments: Has Mild Swelling Bilateral  Skin:    General: Skin is warm and dry.  Neurological:     General: No focal deficit present.     Mental Status: She is alert.     Comments: Got Confused about year. Knew his Age Did not know City   Psychiatric:        Mood and Affect: Mood normal.        Thought Content: Thought content normal.        Judgment: Judgment normal.     Labs reviewed: Basic Metabolic Panel: Recent Labs    04/11/18 0501 04/12/18 0554 06/03/18 1810  NA 132* 135 134*  K 4.1 3.8 4.2  CL 98 103 96*  CO2 26 25 28   GLUCOSE 96 104* 93  BUN 13 12 17   CREATININE 0.62 0.50 0.62  CALCIUM 8.1* 8.3* 8.0*   Liver Function Tests: Recent Labs    04/03/18 1130 04/12/18 0554 06/03/18 1810  AST 17 15 15   ALT 9 8 14   ALKPHOS 91 74 145*  BILITOT 0.7 0.2* 0.2*  PROT 7.1 5.4* 6.4*  ALBUMIN 3.2* 2.1* 2.1*   No results for input(s): LIPASE, AMYLASE in the last 8760 hours. No results for input(s): AMMONIA in the last 8760 hours. CBC: Recent Labs    04/10/18 1400 04/11/18 0501 04/12/18 0554 06/03/18 1810  WBC 13.1* 9.4 9.1 12.5*  NEUTROABS 9.6*  --  6.3 9.4*  HGB 9.6* 8.7* 8.3* 8.5*  HCT 31.1* 28.3* 27.1* 28.6*  MCV 85.0 85.5 85.0 83.6  PLT 461* 401* 392 572*   Cardiac Enzymes:  Recent Labs    02/17/18 1725 02/17/18 2033 04/10/18 1455  TROPONINI <0.03 <0.03 <0.03   BNP: Invalid input(s): POCBNP No results found for: HGBA1C  Lab Results  Component Value Date   VITAMINB12 226 07/16/2008   Lab Results  Component Value Date   FOLATE  07/16/2008    8.9 (NOTE)  Reference  Ranges        Deficient:       0.4 - 3.3 ng/mL        Indeterminate:   3.4 - 5.4 ng/mL        Normal:              > 5.4 ng/mL   Lab Results  Component Value Date   IRON 34 (L) 07/16/2008   TIBC 303 07/16/2008   FERRITIN 70 07/16/2008    Imaging and Procedures obtained prior to SNF admission: No results found.  Assessment/Plan Chronic obstructive pulmonary disease, unspecified COPD type ([place]) - Plan:  Not wheezing  On Nebs and Anoro  Chronic constipation - Plan:  Continue Linzess  GERD Cotinue Protonix  S/P internal fixation right hip fracture cannulated screw placement 04/06/18 - Plan:  WBAT Pain Seems Controlled Working with therapy  Bilateral lower extremity edema - Plan:  On lasix Get Ted hoses Follow Weights Repeat BMP  Anxiety - Plan:  On Xanax and Ambien PRN per her PCP  Cognitive Impairment Wants to go home Not sure How safe it is for Discharge as she has already failed once Will get Speech to evaluate  Urinary Retention Doing well now On Flomax Anemia Seems Post surgery Repeat CBC  Family/ staff Communication:   Labs/tests ordered: CBC, CMP Total time spent in this patient care encounter was  45_  minutes; greater than 50% of the visit spent counseling patient and staff, reviewing records , Labs and coordinating care for problems addressed at this encounter.

## 2018-06-20 ENCOUNTER — Non-Acute Institutional Stay (SKILLED_NURSING_FACILITY): Payer: Medicare Other | Admitting: Adult Health

## 2018-06-20 ENCOUNTER — Other Ambulatory Visit: Payer: Self-pay | Admitting: *Deleted

## 2018-06-20 ENCOUNTER — Other Ambulatory Visit (HOSPITAL_COMMUNITY)
Admission: RE | Admit: 2018-06-20 | Discharge: 2018-06-20 | Disposition: A | Discharge status: Home / Self Care | Payer: Medicare Other | Source: Ambulatory Visit | Attending: Internal Medicine | Admitting: Internal Medicine

## 2018-06-20 ENCOUNTER — Ambulatory Visit (HOSPITAL_COMMUNITY)
Admission: RE | Admit: 2018-06-20 | Discharge: 2018-06-20 | Disposition: A | Discharge status: Home / Self Care | Payer: Medicare Other | Source: Ambulatory Visit | Attending: Internal Medicine | Admitting: Internal Medicine

## 2018-06-20 ENCOUNTER — Encounter: Payer: Self-pay | Admitting: Adult Health

## 2018-06-20 DIAGNOSIS — J449 Chronic obstructive pulmonary disease, unspecified: Secondary | ICD-10-CM | POA: Insufficient documentation

## 2018-06-20 DIAGNOSIS — D72829 Elevated white blood cell count, unspecified: Secondary | ICD-10-CM | POA: Insufficient documentation

## 2018-06-20 DIAGNOSIS — J90 Pleural effusion, not elsewhere classified: Secondary | ICD-10-CM | POA: Insufficient documentation

## 2018-06-20 DIAGNOSIS — R918 Other nonspecific abnormal finding of lung field: Secondary | ICD-10-CM | POA: Insufficient documentation

## 2018-06-20 DIAGNOSIS — D649 Anemia, unspecified: Secondary | ICD-10-CM | POA: Insufficient documentation

## 2018-06-20 DIAGNOSIS — R05 Cough: Secondary | ICD-10-CM | POA: Diagnosis not present

## 2018-06-20 LAB — CBC
HCT: 26.7 % — ABNORMAL LOW (ref 36.0–46.0)
Hemoglobin: 8.1 g/dL — ABNORMAL LOW (ref 12.0–15.0)
MCH: 24.8 pg — ABNORMAL LOW (ref 26.0–34.0)
MCHC: 30.3 g/dL (ref 30.0–36.0)
MCV: 81.7 fL (ref 80.0–100.0)
Platelets: 472 10*3/uL — ABNORMAL HIGH (ref 150–400)
RBC: 3.27 MIL/uL — ABNORMAL LOW (ref 3.87–5.11)
RDW: 15.2 % (ref 11.5–15.5)
WBC: 15 10*3/uL — ABNORMAL HIGH (ref 4.0–10.5)
nRBC: 0 % (ref 0.0–0.2)

## 2018-06-20 LAB — COMPREHENSIVE METABOLIC PANEL
ALT: 13 U/L (ref 0–44)
AST: 20 U/L (ref 15–41)
Albumin: 2 g/dL — ABNORMAL LOW (ref 3.5–5.0)
Alkaline Phosphatase: 111 U/L (ref 38–126)
Anion gap: 12 (ref 5–15)
BUN: 20 mg/dL (ref 8–23)
CO2: 25 mmol/L (ref 22–32)
Calcium: 7.9 mg/dL — ABNORMAL LOW (ref 8.9–10.3)
Chloride: 98 mmol/L (ref 98–111)
Creatinine, Ser: 0.79 mg/dL (ref 0.44–1.00)
GFR calc Af Amer: >60 mL/min (ref >60)
GFR calc non Af Amer: >60 mL/min (ref >60)
Glucose, Bld: 85 mg/dL (ref 70–99)
Potassium: 4.2 mmol/L (ref 3.5–5.1)
Sodium: 135 mmol/L (ref 135–145)
Total Bilirubin: 0.3 mg/dL (ref 0.3–1.2)
Total Protein: 5.7 g/dL — ABNORMAL LOW (ref 6.5–8.1)

## 2018-06-20 NOTE — Patient Outreach (Addendum)
Member discussed in telephonic IDT meeting with [facility] and [facility].  [name] is currently at [facility] SNF receiving rehab therapy. Member screened for potential [facility] Care Management services as a benefit of her [employer] Medicare insurance.  Member lives with family and has a supportive daughter per discharge planner. Facility reports patient admitted to [facility] from home. Active with [facility].    Will continue to follow for disposition plans, progression, and for potential [facility] Care Management needs.   Will continue to collaborate with [facility] UM team and facility on member.    Marthenia Rolling, MSN-Ed, RN,BSN [place] Acute Care Coordinator [phone]

## 2018-06-20 NOTE — Progress Notes (Addendum)
Location:   [place] Room Number: 154 D Place of Service:  SNF (31)   CODE STATUS: Full Code  Allergies  Allergen Reactions  . Codeine Shortness Of Breath and Rash  . Codeine Anaphylaxis  . Levofloxacin Anaphylaxis    Patient was admitted to hospital with lung problems due to this medication  . Sulfonamide Derivatives Hives and Shortness Of Breath  . Sulfa Antibiotics Hives    Chief Complaint  Patient presents with  . Acute Visit    72 Hour Care Plan Meeting    HPI:  We have come together for her 56 hour care plan meeting. Her goal is to return back home. The family members she was living with was not able to meet her adl needs. More than likely she will need assisted living for her long term placement. She has all needed dme. She does have a history of being combative at times per family She has not ran a fever however her wbc is 15.0. her plt count is elevated. She denies any cough or shortness of breath; no dysuria; no changes in appetite; no nausea or vomiting.   Past Medical History:  Diagnosis Date  . Anxiety   . COPD (chronic obstructive pulmonary disease) ([place])   . GERD (gastroesophageal reflux disease)   . Hyperlipidemia   . IBS (irritable bowel syndrome)   . Osteoporosis     Past Surgical History:  Procedure Laterality Date  . COLONOSCOPY  10/2009   sigmoid diverticula, multiple tubulovillous adneomas, needs surveillance Oct 2013  . ESOPHAGOGASTRODUODENOSCOPY  04/16/2011   Dr. Gala [place]: Noncritical Schatzkis ring( not manipulated because no dysphagia). Normal esophagus otherwise  large hiatal hernia. Gastric polyp-status post biopsy. Gastric erosions-staus post biopsy. Abnormal bulb-status post biopsy. benign small bowel, stomach biopsy with ulcerated gastric antral mucosa with foveolar hyperplasia and surface erosion, polyp with inflamed gastric antral mucosa   . ESOPHAGOGASTRODUODENOSCOPY N/A 04/29/2014   Dr. Gala [place]: Noncritical  Schatzki's ring large hiatal hernia. Retained gastric contents.GES with slight delay  . HIP PINNING,CANNULATED Right 04/06/2018   Procedure: CANNULATED HIP PINNING;  Surgeon: Carole Civil, MD;  Location: [facility];  Service: Orthopedics;  Laterality: Right;    Social History   Socioeconomic History  . Marital status: Married    Spouse name: Not on file  . Number of children: Not on file  . Years of education: Not on file  . Highest education level: Not on file  Occupational History  . Not on file  Social Needs  . Financial resource strain: Not on file  . Food insecurity    Worry: Not on file    Inability: Not on file  . Transportation needs    Medical: Not on file    Non-medical: Not on file  Tobacco Use  . Smoking status: Never Smoker  . Smokeless tobacco: Current User    Types: Snuff  Substance and Sexual Activity  . Alcohol use: No  . Drug use: No  . Sexual activity: Never  Lifestyle  . Physical activity    Days per week: Not on file    Minutes per session: Not on file  . Stress: Not on file  Relationships  . Social Herbalist on phone: Not on file    Gets together: Not on file    Attends religious service: Not on file    Active member of club or organization: Not on file    Attends meetings of clubs or organizations:  Not on file    Relationship status: Not on file  . Intimate partner violence    Fear of current or ex partner: Not on file    Emotionally abused: Not on file    Physically abused: Not on file    Forced sexual activity: Not on file  Other Topics Concern  . Not on file  Social History Narrative   ** Merged History Encounter **       Family History  Problem Relation Age of Onset  . Stroke Mother   . Diabetes Mother   . Heart disease Father   . Colon cancer Neg Hx       VITAL SIGNS BP 125/66   Pulse 71   Temp (!) 97.4 F (36.3 C)   Resp 20   Ht _0  (1.6 m)   Wt 125 lb 9.6 oz (57 kg)   BMI 22.25 kg/m   Outpatient  Encounter Medications as of 06/20/2018  Medication Sig  . albuterol (PROAIR HFA) 108 (90 Base) MCG/ACT inhaler Inhale 2 puffs into the lungs every 4 (four) hours as needed.   Marland Kitchen albuterol (PROVENTIL) (2.5 MG/3ML) 0.083% nebulizer solution Take 2.5 mg by nebulization every 6 (six) hours as needed for wheezing or shortness of breath.  . ALPRAZolam (XANAX) 0.5 MG tablet Take 1 tablet (0.5 mg total) by mouth 3 (three) times daily.  . Amino Acids-Protein Hydrolys (FEEDING SUPPLEMENT, PRO-STAT SUGAR FREE 64,) LIQD Take 30 mLs by mouth 3 (three) times daily with meals.  . cetirizine (ZYRTEC) 10 MG tablet Take 10 mg by mouth daily as needed for allergies.  . furosemide (LASIX) 40 MG tablet Take 40 mg by mouth daily as needed.   . linaclotide (LINZESS) 145 MCG CAPS capsule Take 145 mcg by mouth daily as needed.  . NON FORMULARY Diet Type:  Mechanical soft,regular diet  . pantoprazole (PROTONIX) 40 MG tablet Take 1 tablet (40 mg total) by mouth 2 (two) times daily before a meal.  . potassium chloride (K-DUR) 10 MEQ tablet Take 10 mEq by mouth daily as needed.   . tamsulosin (FLOMAX) 0.4 MG CAPS capsule Take 0.4 mg by mouth daily.  . traMADol (ULTRAM) 50 MG tablet Take 100 mg by mouth every 4 (four) hours as needed for moderate pain or severe pain.   Marland Kitchen umeclidinium-vilanterol (ANORO ELLIPTA) 62.5-25 MCG/INH AEPB Inhale 1 puff into the lungs daily.  Marland Kitchen zolpidem (AMBIEN) 5 MG tablet Take 5 mg by mouth at bedtime as needed for sleep. X 14 days   No facility-administered encounter medications on file as of 06/20/2018.      SIGNIFICANT DIAGNOSTIC EXAMS  LABS REVIEWED PREVIOUS:   06-03-18: wbc 12.5; hgb 8.5; hct 28.6; mcv 83.6; plt 572; glucose 93; bun 1; creat 0.62; k+ 4.2; na++ 134; ca 8.0; alk phos 145; albumin 2.1; BNP 126.0   TODAY;  06-20-18: wbc 15.9; hgb 8.1; hct 26.7; mcv 81.7; plt 472; glucose 85; bun 20; creat 0.79; k+ 4.2; na++ 135; ca 7.9; liver normal albumin 2.0   Review of Systems   Constitutional: Positive for malaise/fatigue. Negative for fever.  Respiratory: Negative for cough and shortness of breath.   Cardiovascular: Negative for chest pain, palpitations and leg swelling.  Gastrointestinal: Negative for abdominal pain, constipation and heartburn.  Musculoskeletal: Negative for back pain, joint pain and myalgias.  Skin: Negative.   Neurological: Negative for dizziness.  Psychiatric/Behavioral: The patient is not nervous/anxious.       Physical Exam Constitutional:  General: She is not in acute distress.    Appearance: She is well-developed. She is not diaphoretic.     Comments: THIN  HENT:     Ears:     Comments:  HOH Neck:     Musculoskeletal: Neck supple.     Thyroid: No thyromegaly.  Cardiovascular:     Rate and Rhythm: Normal rate and regular rhythm.     Heart sounds: Normal heart sounds.  Pulmonary:     Effort: Pulmonary effort is normal. No respiratory distress.     Breath sounds: Normal breath sounds.  Abdominal:     General: Bowel sounds are normal. There is no distension.     Palpations: Abdomen is soft.     Tenderness: There is no abdominal tenderness.  Musculoskeletal:     Right lower leg: No edema.     Left lower leg: No edema.     Comments: Is abe to move all extremities Kyphosis  Is status post right hip cannulated nailing: 04-06-18   Lymphadenopathy:     Cervical: No cervical adenopathy.  Skin:    General: Skin is warm and dry.     Comments: Left heel: unstagable: 2 x 3 x 0.2 cm without signs of infection present   Neurological:     Mental Status: She is alert and oriented to person, place, and time.  Psychiatric:        Mood and Affect: Mood normal.      ASSESSMENT/ PLAN:  TODAY;   1. COPD unspecified COPD  2. Leukocytosis 3. Anemia   Is worse Will repeat cbc with diff in the AM Will get blood culture X 2 sites; urine culture chest x-ray hemoccult stool X 2   Will monitor her status and will treat as indicated.   More than likely her discharge plan is to go to assisted living.   MD is aware of resident's narcotic use and is in agreement with current plan of care. We will attempt to wean resident as apropriate   Ok Edwards NP [facility] Adult Medicine  Contact [phone] Monday through Friday 8am- 5pm  After hours call [phone]

## 2018-06-21 ENCOUNTER — Other Ambulatory Visit (HOSPITAL_COMMUNITY)
Admission: RE | Admit: 2018-06-21 | Discharge: 2018-06-21 | Disposition: A | Discharge status: Home / Self Care | Payer: Medicare Other | Source: Ambulatory Visit | Attending: Adult Health | Admitting: Adult Health

## 2018-06-21 ENCOUNTER — Encounter: Payer: Self-pay | Admitting: Adult Health

## 2018-06-21 ENCOUNTER — Non-Acute Institutional Stay (SKILLED_NURSING_FACILITY): Payer: Medicare Other | Admitting: Adult Health

## 2018-06-21 DIAGNOSIS — R4182 Altered mental status, unspecified: Secondary | ICD-10-CM | POA: Insufficient documentation

## 2018-06-21 DIAGNOSIS — J189 Pneumonia, unspecified organism: Secondary | ICD-10-CM

## 2018-06-21 DIAGNOSIS — J90 Pleural effusion, not elsewhere classified: Secondary | ICD-10-CM

## 2018-06-21 DIAGNOSIS — Z9181 History of falling: Secondary | ICD-10-CM | POA: Insufficient documentation

## 2018-06-21 DIAGNOSIS — S72001D Fracture of unspecified part of neck of right femur, subsequent encounter for closed fracture with routine healing: Secondary | ICD-10-CM | POA: Insufficient documentation

## 2018-06-21 LAB — URINE CULTURE: Culture: NO GROWTH

## 2018-06-21 NOTE — Progress Notes (Signed)
Location:   [facility] Room Number: 154 D Place of Service:  SNF (31)   CODE STATUS: Full Code  Allergies  Allergen Reactions  . Codeine Shortness Of Breath and Rash  . Codeine Anaphylaxis  . Levofloxacin Anaphylaxis    Patient was admitted to hospital with lung problems due to this medication  . Sulfonamide Derivatives Hives and Shortness Of Breath  . Sulfa Antibiotics Hives    Chief Complaint  Patient presents with  . Acute Visit    Pneumonia    HPI:  She has had a workup done for her elevated wbc. Her wbc is elevated at 15.9. There are no reports of fevers; she denies any increased cough of shortness of breath. Her chest x-ray demonstrated pneumonia with effusions.   Past Medical History:  Diagnosis Date  . Anxiety   . COPD (chronic obstructive pulmonary disease) ([place])   . GERD (gastroesophageal reflux disease)   . Hyperlipidemia   . IBS (irritable bowel syndrome)   . Osteoporosis     Past Surgical History:  Procedure Laterality Date  . COLONOSCOPY  10/2009   sigmoid diverticula, multiple tubulovillous adneomas, needs surveillance Oct 2013  . ESOPHAGOGASTRODUODENOSCOPY  04/16/2011   Dr. Gala Romney: Noncritical Schatzkis ring( not manipulated because no dysphagia). Normal esophagus otherwise  large hiatal hernia. Gastric polyp-status post biopsy. Gastric erosions-staus post biopsy. Abnormal bulb-status post biopsy. benign small bowel, stomach biopsy with ulcerated gastric antral mucosa with foveolar hyperplasia and surface erosion, polyp with inflamed gastric antral mucosa   . ESOPHAGOGASTRODUODENOSCOPY N/A 04/29/2014   Dr. Gala Romney: Noncritical Schatzki's ring large hiatal hernia. Retained gastric contents.GES with slight delay  . HIP PINNING,CANNULATED Right 04/06/2018   Procedure: CANNULATED HIP PINNING;  Surgeon: Carole Civil, MD;  Location: [facility];  Service: Orthopedics;  Laterality: Right;    Social History   Socioeconomic History   . Marital status: Married    Spouse name: Not on file  . Number of children: Not on file  . Years of education: Not on file  . Highest education level: Not on file  Occupational History  . Not on file  Social Needs  . Financial resource strain: Not on file  . Food insecurity    Worry: Not on file    Inability: Not on file  . Transportation needs    Medical: Not on file    Non-medical: Not on file  Tobacco Use  . Smoking status: Never Smoker  . Smokeless tobacco: Current User    Types: Snuff  Substance and Sexual Activity  . Alcohol use: No  . Drug use: No  . Sexual activity: Never  Lifestyle  . Physical activity    Days per week: Not on file    Minutes per session: Not on file  . Stress: Not on file  Relationships  . Social Herbalist on phone: Not on file    Gets together: Not on file    Attends religious service: Not on file    Active member of club or organization: Not on file    Attends meetings of clubs or organizations: Not on file    Relationship status: Not on file  . Intimate partner violence    Fear of current or ex partner: Not on file    Emotionally abused: Not on file    Physically abused: Not on file    Forced sexual activity: Not on file  Other Topics Concern  . Not on file  Social History Narrative   ** Merged History Encounter **       Family History  Problem Relation Age of Onset  . Stroke Mother   . Diabetes Mother   . Heart disease Father   . Colon cancer Neg Hx       VITAL SIGNS BP 130/71   Pulse 66   Temp 98 F (36.7 C)   Resp 20   Ht 5' 3"  (1.6 m)   Wt 125 lb 10.1 oz (57 kg)   BMI 22.25 kg/m   Outpatient Encounter Medications as of 06/21/2018  Medication Sig  . albuterol (PROAIR HFA) 108 (90 Base) MCG/ACT inhaler Inhale 2 puffs into the lungs every 4 (four) hours as needed.   Marland Kitchen albuterol (PROVENTIL) (2.5 MG/3ML) 0.083% nebulizer solution Take 2.5 mg by nebulization every 6 (six) hours as needed for wheezing or  shortness of breath.  . ALPRAZolam (XANAX) 0.5 MG tablet Take 1 tablet (0.5 mg total) by mouth 3 (three) times daily.  . Amino Acids-Protein Hydrolys (FEEDING SUPPLEMENT, PRO-STAT SUGAR FREE 64,) LIQD Take 30 mLs by mouth 3 (three) times daily with meals.  . cetirizine (ZYRTEC) 10 MG tablet Take 10 mg by mouth daily as needed for allergies.  . furosemide (LASIX) 40 MG tablet Take 40 mg by mouth daily as needed.   . linaclotide (LINZESS) 145 MCG CAPS capsule Take 145 mcg by mouth daily as needed.  . NON FORMULARY Diet Type:  Mechanical soft,regular diet  . pantoprazole (PROTONIX) 40 MG tablet Take 1 tablet (40 mg total) by mouth 2 (two) times daily before a meal.  . potassium chloride (K-DUR) 10 MEQ tablet Take 10 mEq by mouth daily as needed.   . tamsulosin (FLOMAX) 0.4 MG CAPS capsule Take 0.4 mg by mouth daily.  . traMADol (ULTRAM) 50 MG tablet Take 100 mg by mouth every 4 (four) hours as needed for moderate pain or severe pain.   Marland Kitchen umeclidinium-vilanterol (ANORO ELLIPTA) 62.5-25 MCG/INH AEPB Inhale 1 puff into the lungs daily.  Marland Kitchen UNABLE TO FIND Silver Alginate - Clean wound left medial heel with N / S, apply sliver alginate and allevyn foam dressing,  Change daily and as needed  . Wound Dressings (ALLEVYN ADHESIVE EX) Allevyn Foam Dressing:   Apply to kyphotic bony prominence of spine and to lesion area right upper back and change every 3 days and as needed  . zolpidem (AMBIEN) 5 MG tablet Take 5 mg by mouth at bedtime as needed for sleep. X 14 days   No facility-administered encounter medications on file as of 06/21/2018.      SIGNIFICANT DIAGNOSTIC EXAMS  TODAY:   06-20-18: chest x-ray: New bilateral infiltrates and persistent effusions.   LABS REVIEWED PREVIOUS:   06-03-18: wbc 12.5; hgb 8.5; hct 28.6; mcv 83.6; plt 572; glucose 93; bun 1; creat 0.62; k+ 4.2; na++ 134; ca 8.0; alk phos 145; albumin 2.1; BNP 126.0  06-20-18: wbc 15.9; hgb 8.1; hct 26.7; mcv 81.7; plt 472; glucose 85;  bun 20; creat 0.79; k+ 4.2; na++ 135; ca 7.9; liver normal albumin 2.0   NO NEW LABS.    Review of Systems  Constitutional: Positive for malaise/fatigue.  Respiratory: Negative for cough and shortness of breath.   Cardiovascular: Negative for chest pain, palpitations and leg swelling.  Gastrointestinal: Negative for abdominal pain, constipation and heartburn.  Musculoskeletal: Negative for back pain, joint pain and myalgias.  Skin: Negative.   Neurological: Negative for dizziness.  Psychiatric/Behavioral: The patient is not  nervous/anxious.      Physical Exam Constitutional:      General: She is not in acute distress.    Appearance: She is well-developed. She is not diaphoretic.     Comments: thin  HENT:     Ears:     Comments: HOH Neck:     Musculoskeletal: Neck supple.     Thyroid: No thyromegaly.  Cardiovascular:     Rate and Rhythm: Normal rate and regular rhythm.     Pulses: Normal pulses.     Heart sounds: Normal heart sounds.  Pulmonary:     Effort: Pulmonary effort is normal. No respiratory distress.     Breath sounds: Normal breath sounds.  Abdominal:     General: Bowel sounds are normal. There is no distension.     Palpations: Abdomen is soft.     Tenderness: There is no abdominal tenderness.  Musculoskeletal:     Right lower leg: No edema.     Left lower leg: No edema.     Comments: Is abe to move all extremities Kyphosis  Is status post right hip cannulated nailing: 04-06-18    Lymphadenopathy:     Cervical: No cervical adenopathy.  Skin:    General: Skin is warm and dry.     Comments: Left heel: unstagable: 2 x 3 x 0.2 cm without signs of infection present    Neurological:     Mental Status: She is alert.  Psychiatric:        Mood and Affect: Mood normal.     ASSESSMENT/ PLAN:  TODAY;   1. Community acquired pneumonia 2. Bilateral pleural effusions  Will begin augmentin 875/125 mg twice daily through 07-01-18 Will change lasix to 40 mg twice  daily through 06-25-18 then return to 40 mg daily on 06-26-18.  Will monitor her status.    MD is aware of resident's narcotic use and is in agreement with current plan of care. We will attempt to wean resident as apropriate   Ok Edwards NP [facility]  Contact [phone] Monday through Friday 8am- 5pm  After hours call [phone]

## 2018-06-22 ENCOUNTER — Non-Acute Institutional Stay (SKILLED_NURSING_FACILITY): Payer: Medicare Other | Admitting: Adult Health

## 2018-06-22 ENCOUNTER — Other Ambulatory Visit: Payer: Self-pay | Admitting: Adult Health

## 2018-06-22 DIAGNOSIS — F0150 Vascular dementia without behavioral disturbance: Secondary | ICD-10-CM | POA: Diagnosis not present

## 2018-06-22 MED ORDER — TRAMADOL HCL 50 MG PO TABS: 50.0000 mg | ORAL_TABLET | Freq: Three times a day (TID) | ORAL | 0 refills | Status: DC | PRN

## 2018-06-23 ENCOUNTER — Encounter: Payer: Self-pay | Admitting: Adult Health

## 2018-06-23 ENCOUNTER — Non-Acute Institutional Stay (SKILLED_NURSING_FACILITY): Payer: Medicare Other | Admitting: Adult Health

## 2018-06-23 ENCOUNTER — Other Ambulatory Visit: Payer: Self-pay | Admitting: Adult Health

## 2018-06-23 DIAGNOSIS — Z8781 Personal history of (healed) traumatic fracture: Secondary | ICD-10-CM | POA: Diagnosis not present

## 2018-06-23 DIAGNOSIS — J189 Pneumonia, unspecified organism: Secondary | ICD-10-CM | POA: Insufficient documentation

## 2018-06-23 DIAGNOSIS — J90 Pleural effusion, not elsewhere classified: Secondary | ICD-10-CM | POA: Insufficient documentation

## 2018-06-23 DIAGNOSIS — F0150 Vascular dementia without behavioral disturbance: Secondary | ICD-10-CM

## 2018-06-23 DIAGNOSIS — E43 Unspecified severe protein-calorie malnutrition: Secondary | ICD-10-CM | POA: Diagnosis not present

## 2018-06-23 DIAGNOSIS — S72001S Fracture of unspecified part of neck of right femur, sequela: Secondary | ICD-10-CM

## 2018-06-23 DIAGNOSIS — Z9889 Other specified postprocedural states: Secondary | ICD-10-CM

## 2018-06-23 MED ORDER — AMOXICILLIN-POT CLAVULANATE 875-125 MG PO TABS: 1.0000 | ORAL_TABLET | Freq: Two times a day (BID) | ORAL | 0 refills | Status: DC

## 2018-06-23 MED ORDER — ALPRAZOLAM 0.5 MG PO TABS: 0.5000 mg | ORAL_TABLET | Freq: Three times a day (TID) | ORAL | 0 refills | Status: DC

## 2018-06-23 MED ORDER — FUROSEMIDE 40 MG PO TABS: 40.0000 mg | ORAL_TABLET | Freq: Every day | ORAL | 0 refills | Status: DC

## 2018-06-23 MED ORDER — UMECLIDINIUM-VILANTEROL 62.5-25 MCG/INH IN AEPB: 1.0000 | INHALATION_SPRAY | Freq: Every day | RESPIRATORY_TRACT | 0 refills | Status: DC

## 2018-06-23 MED ORDER — ALBUTEROL SULFATE (2.5 MG/3ML) 0.083% IN NEBU: 2.5000 mg | INHALATION_SOLUTION | Freq: Four times a day (QID) | RESPIRATORY_TRACT | 0 refills | Status: DC | PRN

## 2018-06-23 MED ORDER — LINACLOTIDE 145 MCG PO CAPS: 145.0000 ug | ORAL_CAPSULE | Freq: Every day | ORAL | 0 refills | Status: DC | PRN

## 2018-06-23 MED ORDER — TAMSULOSIN HCL 0.4 MG PO CAPS: 0.4000 mg | ORAL_CAPSULE | Freq: Every day | ORAL | 0 refills | Status: DC

## 2018-06-23 MED ORDER — TRAMADOL HCL 50 MG PO TABS: 100.0000 mg | ORAL_TABLET | ORAL | 0 refills | Status: DC | PRN

## 2018-06-23 MED ORDER — PANTOPRAZOLE SODIUM 40 MG PO TBEC: 40.0000 mg | DELAYED_RELEASE_TABLET | Freq: Two times a day (BID) | ORAL | 0 refills | Status: DC

## 2018-06-23 MED ORDER — POTASSIUM CHLORIDE ER 10 MEQ PO TBCR: 10.0000 meq | EXTENDED_RELEASE_TABLET | Freq: Every day | ORAL | 0 refills | Status: DC

## 2018-06-23 MED ORDER — ALBUTEROL SULFATE HFA 108 (90 BASE) MCG/ACT IN AERS: 2.0000 | INHALATION_SPRAY | RESPIRATORY_TRACT | 0 refills | Status: DC | PRN

## 2018-06-23 MED ORDER — NAMENDA XR TITRATION PACK 7 & 14 & 21 &28 MG PO CP24: 6.0000 mg | ORAL_CAPSULE | Freq: Every day | ORAL | 0 refills | Status: DC

## 2018-06-23 MED ORDER — ZOLPIDEM TARTRATE 5 MG PO TABS: 5.0000 mg | ORAL_TABLET | Freq: Every evening | ORAL | 0 refills | Status: DC | PRN

## 2018-06-23 NOTE — Progress Notes (Signed)
Location:   [facility] Room Number: 154 D Place of Service:  SNF (31)   CODE STATUS: Full Code  Allergies  Allergen Reactions  . Codeine Shortness Of Breath and Rash  . Codeine Anaphylaxis  . Levofloxacin Anaphylaxis    Patient was admitted to hospital with lung problems due to this medication  . Sulfonamide Derivatives Hives and Shortness Of Breath  . Sulfa Antibiotics Hives    Chief Complaint  Patient presents with  . Acute Visit    Status    HPI:  Speech therapy reports that she has done poorly on her [place] testing; which demonstrates deficits in working memory; attention; orientation. The results show possible dementia. She denies any pain; no cough; no shortness of breath. She tells me that her appetite is good; she denies any anxiety. There are no reports of fevers present.   Past Medical History:  Diagnosis Date  . Anxiety   . COPD (chronic obstructive pulmonary disease) ([place])   . GERD (gastroesophageal reflux disease)   . Hyperlipidemia   . IBS (irritable bowel syndrome)   . Osteoporosis     Past Surgical History:  Procedure Laterality Date  . COLONOSCOPY  10/2009   sigmoid diverticula, multiple tubulovillous adneomas, needs surveillance Oct 2013  . ESOPHAGOGASTRODUODENOSCOPY  04/16/2011   Dr. Gala Romney: Noncritical Schatzkis ring( not manipulated because no dysphagia). Normal esophagus otherwise  large hiatal hernia. Gastric polyp-status post biopsy. Gastric erosions-staus post biopsy. Abnormal bulb-status post biopsy. benign small bowel, stomach biopsy with ulcerated gastric antral mucosa with foveolar hyperplasia and surface erosion, polyp with inflamed gastric antral mucosa   . ESOPHAGOGASTRODUODENOSCOPY N/A 04/29/2014   Dr. Gala Romney: Noncritical Schatzki's ring large hiatal hernia. Retained gastric contents.GES with slight delay  . HIP PINNING,CANNULATED Right 04/06/2018   Procedure: CANNULATED HIP PINNING;  Surgeon: Carole Civil,  MD;  Location: [facility];  Service: Orthopedics;  Laterality: Right;    Social History   Socioeconomic History  . Marital status: Married    Spouse name: Not on file  . Number of children: Not on file  . Years of education: Not on file  . Highest education level: Not on file  Occupational History  . Not on file  Social Needs  . Financial resource strain: Not on file  . Food insecurity    Worry: Not on file    Inability: Not on file  . Transportation needs    Medical: Not on file    Non-medical: Not on file  Tobacco Use  . Smoking status: Never Smoker  . Smokeless tobacco: Current User    Types: Snuff  Substance and Sexual Activity  . Alcohol use: No  . Drug use: No  . Sexual activity: Never  Lifestyle  . Physical activity    Days per week: Not on file    Minutes per session: Not on file  . Stress: Not on file  Relationships  . Social Herbalist on phone: Not on file    Gets together: Not on file    Attends religious service: Not on file    Active member of club or organization: Not on file    Attends meetings of clubs or organizations: Not on file    Relationship status: Not on file  . Intimate partner violence    Fear of current or ex partner: Not on file    Emotionally abused: Not on file    Physically abused: Not on file  Forced sexual activity: Not on file  Other Topics Concern  . Not on file  Social History Narrative   ** Merged History Encounter **       Family History  Problem Relation Age of Onset  . Stroke Mother   . Diabetes Mother   . Heart disease Father   . Colon cancer Neg Hx       VITAL SIGNS BP 92/74   Pulse 69   Temp (!) 96.7 F (35.9 C)   Resp 20   Ht _0  (1.6 m)   Wt 125 lb 9.6 oz (57 kg)   BMI 22.25 kg/m   Outpatient Encounter Medications as of 06/22/2018  Medication Sig  . albuterol (PROAIR HFA) 108 (90 Base) MCG/ACT inhaler Inhale 2 puffs into the lungs every 4 (four) hours as needed.   Marland Kitchen albuterol  (PROVENTIL) (2.5 MG/3ML) 0.083% nebulizer solution Take 2.5 mg by nebulization every 6 (six) hours as needed for wheezing or shortness of breath.  . ALPRAZolam (XANAX) 0.5 MG tablet Take 1 tablet (0.5 mg total) by mouth 3 (three) times daily.  . Amino Acids-Protein Hydrolys (FEEDING SUPPLEMENT, PRO-STAT SUGAR FREE 64,) LIQD Take 30 mLs by mouth 3 (three) times daily with meals.  Marland Kitchen amoxicillin-clavulanate (AUGMENTIN) 875-125 MG tablet Take 1 tablet by mouth 2 (two) times daily. X 10 days  . cetirizine (ZYRTEC) 10 MG tablet Take 10 mg by mouth daily as needed for allergies.  . furosemide (LASIX) 40 MG tablet Take 40 mg by mouth 2 (two) times daily.   Derrill Memo ON 06/26/2018] furosemide (LASIX) 40 MG tablet Take 40 mg by mouth daily. Beginning 06/26/18  . linaclotide (LINZESS) 145 MCG CAPS capsule Take 145 mcg by mouth daily as needed.  . Memantine HCl ER (NAMENDA XR TITRATION PACK) 7 & 14 & 21 &28 MG CP24 Take by mouth. 06/23/2018 - 06/30/2018 = 7 mg daily 07/01/2018 - 07/08/2018 = 14 mg daily 07/09/2018 - 07/16/2018 - 21 mg 07/17/19 - Begin 28 mg daily  . NON FORMULARY Diet Type:  Mechanical soft,regular diet  . pantoprazole (PROTONIX) 40 MG tablet Take 1 tablet (40 mg total) by mouth 2 (two) times daily before a meal.  . potassium chloride (K-DUR) 10 MEQ tablet Take 10 mEq by mouth daily as needed.   . Probiotic Product (RISA-BID PROBIOTIC) TABS Take 1 tablet by mouth 2 (two) times a day.  . tamsulosin (FLOMAX) 0.4 MG CAPS capsule Take 0.4 mg by mouth daily.  . traMADol (ULTRAM) 50 MG tablet Take 100 mg by mouth every 4 (four) hours as needed. For 8-10/10 pain  . umeclidinium-vilanterol (ANORO ELLIPTA) 62.5-25 MCG/INH AEPB Inhale 1 puff into the lungs daily.  Marland Kitchen UNABLE TO FIND Silver Alginate - Clean wound left medial heel with N / S, apply sliver alginate and allevyn foam dressing,  Change daily and as needed  . Wound Dressings (ALLEVYN ADHESIVE EX) Allevyn Foam Dressing:   Apply to kyphotic bony prominence  of spine and to lesion area right upper back and change every 3 days and as needed  . zolpidem (AMBIEN) 5 MG tablet Take 5 mg by mouth at bedtime as needed for sleep. X 14 days  . [DISCONTINUED] traMADol (ULTRAM) 50 MG tablet Take 1 tablet (50 mg total) by mouth every 8 (eight) hours as needed for up to 4 days. (Patient not taking: Reported on 06/23/2018)   No facility-administered encounter medications on file as of 06/22/2018.      SIGNIFICANT DIAGNOSTIC EXAMS  LABS REVIEWED PREVIOUS:   06-03-18: wbc 12.5; hgb 8.5; hct 28.6; mcv 83.6; plt 572; glucose 93; bun 1; creat 0.62; k+ 4.2; na++ 134; ca 8.0; alk phos 145; albumin 2.1; BNP 126.0  06-20-18: wbc 15.9; hgb 8.1; hct 26.7; mcv 81.7; plt 472; glucose 85; bun 20; creat 0.79; k+ 4.2; na++ 135; ca 7.9; liver normal albumin 2.0   NO NEW LABS.   Review of Systems  Constitutional: Negative for malaise/fatigue.  Respiratory: Negative for cough and shortness of breath.   Cardiovascular: Negative for chest pain and leg swelling.  Gastrointestinal: Negative for abdominal pain and constipation.  Musculoskeletal: Negative for myalgias.  Skin: Negative.   Neurological: Negative for dizziness.  Psychiatric/Behavioral: The patient is not nervous/anxious.      Physical Exam Constitutional:      General: She is not in acute distress.    Appearance: She is well-developed. She is not diaphoretic.     Comments: thin  HENT:     Ears:     Comments: HOH Neck:     Musculoskeletal: Neck supple.     Thyroid: No thyromegaly.  Cardiovascular:     Rate and Rhythm: Normal rate and regular rhythm.     Pulses: Normal pulses.     Heart sounds: Normal heart sounds.  Pulmonary:     Effort: Pulmonary effort is normal. No respiratory distress.     Breath sounds: Normal breath sounds.  Abdominal:     General: Bowel sounds are normal. There is no distension.     Palpations: Abdomen is soft.     Tenderness: There is no abdominal tenderness.   Musculoskeletal:     Right lower leg: No edema.     Left lower leg: No edema.     Comments:  Is abe to move all extremities Kyphosis  Is status post right hip cannulated nailing: 04-06-18    Lymphadenopathy:     Cervical: No cervical adenopathy.  Skin:    General: Skin is warm and dry.     Comments:  Left heel: unstagable: 2 x 3 x 0.2 cm without signs of infection present     Neurological:     Mental Status: She is alert. Mental status is at baseline.  Psychiatric:        Mood and Affect: Mood normal.      ASSESSMENT/ PLAN:  TODAY;   1. Vascular dementia without behavioral disturbance: will begin her on namenda xl titration. Will monitor her status.   MD is aware of resident's narcotic use and is in agreement with current plan of care. We will attempt to wean resident as apropriate   Ok Edwards NP [facility]  Contact [phone] Monday through Friday 8am- 5pm  After hours call [phone]

## 2018-06-23 NOTE — Progress Notes (Signed)
Location:   [facility] Room Number: 154 D Place of Service:  SNF (31)    CODE STATUS: Full Code  Allergies  Allergen Reactions  . Codeine Shortness Of Breath and Rash  . Codeine Anaphylaxis  . Levofloxacin Anaphylaxis    Patient was admitted to hospital with lung problems due to this medication  . Sulfonamide Derivatives Hives and Shortness Of Breath  . Sulfa Antibiotics Hives    Chief Complaint  Patient presents with  . Discharge Note    Discharging to home on 06/25/2018    HPI:   She is being discharged to home with home health for pt/ot/rn/cna/st/sw. She will not need any dme. She will need her prescriptions written and will need to follow up with her medical provider. She had been admitted to this facility for short term rehab from her home. She had a right hip fracture in April of this year; had not been doing well at home. She is now ready to complete therapy on a home health basis.    Past Medical History:  Diagnosis Date  . Anxiety   . COPD (chronic obstructive pulmonary disease) ([place])   . GERD (gastroesophageal reflux disease)   . Hyperlipidemia   . IBS (irritable bowel syndrome)   . Osteoporosis     Past Surgical History:  Procedure Laterality Date  . COLONOSCOPY  10/2009   sigmoid diverticula, multiple tubulovillous adneomas, needs surveillance Oct 2013  . ESOPHAGOGASTRODUODENOSCOPY  04/16/2011   Dr. Gala Romney: Noncritical Schatzkis ring( not manipulated because no dysphagia). Normal esophagus otherwise  large hiatal hernia. Gastric polyp-status post biopsy. Gastric erosions-staus post biopsy. Abnormal bulb-status post biopsy. benign small bowel, stomach biopsy with ulcerated gastric antral mucosa with foveolar hyperplasia and surface erosion, polyp with inflamed gastric antral mucosa   . ESOPHAGOGASTRODUODENOSCOPY N/A 04/29/2014   Dr. Gala Romney: Noncritical Schatzki's ring large hiatal hernia. Retained gastric contents.GES with slight  delay  . HIP PINNING,CANNULATED Right 04/06/2018   Procedure: CANNULATED HIP PINNING;  Surgeon: Carole Civil, MD;  Location: [facility];  Service: Orthopedics;  Laterality: Right;    Social History   Socioeconomic History  . Marital status: Married    Spouse name: Not on file  . Number of children: Not on file  . Years of education: Not on file  . Highest education level: Not on file  Occupational History  . Not on file  Social Needs  . Financial resource strain: Not on file  . Food insecurity    Worry: Not on file    Inability: Not on file  . Transportation needs    Medical: Not on file    Non-medical: Not on file  Tobacco Use  . Smoking status: Never Smoker  . Smokeless tobacco: Current User    Types: Snuff  Substance and Sexual Activity  . Alcohol use: No  . Drug use: No  . Sexual activity: Never  Lifestyle  . Physical activity    Days per week: Not on file    Minutes per session: Not on file  . Stress: Not on file  Relationships  . Social Herbalist on phone: Not on file    Gets together: Not on file    Attends religious service: Not on file    Active member of club or organization: Not on file    Attends meetings of clubs or organizations: Not on file    Relationship status: Not on file  . Intimate partner violence  Fear of current or ex partner: Not on file    Emotionally abused: Not on file    Physically abused: Not on file    Forced sexual activity: Not on file  Other Topics Concern  . Not on file  Social History Narrative   ** Merged History Encounter **       Family History  Problem Relation Age of Onset  . Stroke Mother   . Diabetes Mother   . Heart disease Father   . Colon cancer Neg Hx     VITAL SIGNS BP (!) 90/58   Pulse 87   Temp 98.4 F (36.9 C)   Resp 20   Ht '5\' 3"'$  (1.6 m)   Wt 125 lb 9.6 oz (57 kg)   BMI 22.25 kg/m   Patient's Medications  New Prescriptions   No medications on file  Previous Medications    ALBUTEROL (PROAIR HFA) 108 (90 BASE) MCG/ACT INHALER    Inhale 2 puffs into the lungs every 4 (four) hours as needed.    ALBUTEROL (PROVENTIL) (2.5 MG/3ML) 0.083% NEBULIZER SOLUTION    Take 2.5 mg by nebulization every 6 (six) hours as needed for wheezing or shortness of breath.   ALPRAZOLAM (XANAX) 0.5 MG TABLET    Take 1 tablet (0.5 mg total) by mouth 3 (three) times daily.   AMINO ACIDS-PROTEIN HYDROLYS (FEEDING SUPPLEMENT, PRO-STAT SUGAR FREE 64,) LIQD    Take 30 mLs by mouth 3 (three) times daily with meals.   AMOXICILLIN-CLAVULANATE (AUGMENTIN) 875-125 MG TABLET    Take 1 tablet by mouth 2 (two) times daily. X 10 days   CETIRIZINE (ZYRTEC) 10 MG TABLET    Take 10 mg by mouth daily as needed for allergies.   FUROSEMIDE (LASIX) 40 MG TABLET    Take 40 mg by mouth 2 (two) times daily.    FUROSEMIDE (LASIX) 40 MG TABLET    Take 40 mg by mouth daily. Beginning 06/26/18   LINACLOTIDE (LINZESS) 145 MCG CAPS CAPSULE    Take 145 mcg by mouth daily as needed.   MEMANTINE HCL ER (NAMENDA XR TITRATION PACK) 7 & 14 & 21 &28 MG CP24    Take by mouth. 06/23/2018 - 06/30/2018 = 7 mg daily 07/01/2018 - 07/08/2018 = 14 mg daily 07/09/2018 - 07/16/2018 - 21 mg 07/17/19 - Begin 28 mg daily   NON FORMULARY    Diet Type:  Mechanical soft,regular diet   PANTOPRAZOLE (PROTONIX) 40 MG TABLET    Take 1 tablet (40 mg total) by mouth 2 (two) times daily before a meal.   POTASSIUM CHLORIDE (K-DUR) 10 MEQ TABLET    Take 10 mEq by mouth daily as needed.    PROBIOTIC PRODUCT (RISA-BID PROBIOTIC) TABS    Take 1 tablet by mouth 2 (two) times a day.   TAMSULOSIN (FLOMAX) 0.4 MG CAPS CAPSULE    Take 0.4 mg by mouth daily.   TRAMADOL (ULTRAM) 50 MG TABLET    Take 100 mg by mouth every 4 (four) hours as needed. For 8-10/10 pain   UMECLIDINIUM-VILANTEROL (ANORO ELLIPTA) 62.5-25 MCG/INH AEPB    Inhale 1 puff into the lungs daily.   UNABLE TO FIND    Silver Alginate - Clean wound left medial heel with N / S, apply sliver alginate and  allevyn foam dressing,  Change daily and as needed   WOUND DRESSINGS (ALLEVYN ADHESIVE EX)    Allevyn Foam Dressing:   Apply to kyphotic bony prominence of spine and to lesion area  right upper back and change every 3 days and as needed   ZOLPIDEM (AMBIEN) 5 MG TABLET    Take 5 mg by mouth at bedtime as needed for sleep. X 14 days  Modified Medications   No medications on file  Discontinued Medications   No medications on file     SIGNIFICANT DIAGNOSTIC EXAMS   LABS REVIEWED PREVIOUS:   06-03-18: wbc 12.5; hgb 8.5; hct 28.6; mcv 83.6; plt 572; glucose 93; bun 1; creat 0.62; k+ 4.2; na++ 134; ca 8.0; alk phos 145; albumin 2.1; BNP 126.0  06-20-18: wbc 15.9; hgb 8.1; hct 26.7; mcv 81.7; plt 472; glucose 85; bun 20; creat 0.79; k+ 4.2; na++ 135; ca 7.9; liver normal albumin 2.0   NO NEW LABS.    Review of Systems  Constitutional: Negative for malaise/fatigue.  Respiratory: Negative for cough and shortness of breath.   Cardiovascular: Negative for chest pain, palpitations and leg swelling.  Gastrointestinal: Negative for abdominal pain, constipation and heartburn.  Musculoskeletal: Negative for back pain, joint pain and myalgias.  Skin: Negative.   Neurological: Negative for dizziness.  Psychiatric/Behavioral: The patient is not nervous/anxious.      Physical Exam Constitutional:      General: She is not in acute distress.    Appearance: She is well-developed. She is not diaphoretic.  HENT:     Ears:     Comments: HOH Neck:     Musculoskeletal: Neck supple.     Thyroid: No thyromegaly.  Cardiovascular:     Rate and Rhythm: Normal rate and regular rhythm.     Pulses: Normal pulses.     Heart sounds: Normal heart sounds.  Pulmonary:     Effort: Pulmonary effort is normal. No respiratory distress.     Breath sounds: Normal breath sounds.  Abdominal:     General: Bowel sounds are normal. There is no distension.     Palpations: Abdomen is soft.     Tenderness: There is no  abdominal tenderness.  Musculoskeletal:     Right lower leg: No edema.     Left lower leg: No edema.     Comments: Is abe to move all extremities Kyphosis  Is status post right hip cannulated nailing: 04-06-18     Lymphadenopathy:     Cervical: No cervical adenopathy.  Skin:    General: Skin is warm and dry.  Neurological:     Mental Status: She is alert. Mental status is at baseline.  Psychiatric:        Mood and Affect: Mood normal.       ASSESSMENT/ PLAN:   Patient is being discharged with the following home health services:  Pt/ot/rn/cna/st/sw: to evaluate and treat as indicated for gait balance strength adl training medication management; adl care; cognition community resources.   Patient is being discharged with the following durable medical equipment:  None needed   Patient has been advised to f/u with their PCP in 1-2 weeks to bring them up to date on their rehab stay.  Social services at facility was responsible for arranging this appointment.  Pt was provided with a 30 day supply of prescriptions for medications and refills must be obtained from their PCP.  For controlled substances, a more limited supply may be provided adequate until PCP appointment only.    A 30 day supply of her prescription medications have been sent to [facility]   Time spent with patient: 35 minutes: home health needs; medication; and ensuring dme at home.  Ok Edwards NP [facility]  Contact [phone] Monday through Friday 8am- 5pm  After hours call [phone]

## 2018-06-26 ENCOUNTER — Other Ambulatory Visit: Payer: Self-pay | Admitting: *Deleted

## 2018-06-26 DIAGNOSIS — F0150 Vascular dementia without behavioral disturbance: Secondary | ICD-10-CM | POA: Insufficient documentation

## 2018-06-26 DIAGNOSIS — S72011D Unspecified intracapsular fracture of right femur, subsequent encounter for closed fracture with routine healing: Secondary | ICD-10-CM | POA: Diagnosis not present

## 2018-06-26 DIAGNOSIS — W19XXXD Unspecified fall, subsequent encounter: Secondary | ICD-10-CM | POA: Diagnosis not present

## 2018-06-26 DIAGNOSIS — L989 Disorder of the skin and subcutaneous tissue, unspecified: Secondary | ICD-10-CM | POA: Diagnosis not present

## 2018-06-26 NOTE — Patient Outreach (Signed)
Member assessed for potential [facility] Care Management needs as a benefit of her [place] [employer].  Verified in PatientPing that Mrs. Courtwright discharged home on 06/25/2018 from [facility].  Telephone call made to [facility] home number at [phone]. When writer attempted to verify patient identifiers with Mrs. Kimbrough, she hung up the phone.   Writer to sign off as member would not engage in telephone conversation related to [facility] Management services post SNF discharge.   Marthenia Rolling, MSN-Ed, RN,BSN [facility] Acute Care Coordinator [phone] [facility]) [phone]  (Toll free office)

## 2018-06-27 DIAGNOSIS — S72011D Unspecified intracapsular fracture of right femur, subsequent encounter for closed fracture with routine healing: Secondary | ICD-10-CM | POA: Diagnosis not present

## 2018-06-27 DIAGNOSIS — W19XXXD Unspecified fall, subsequent encounter: Secondary | ICD-10-CM | POA: Diagnosis not present

## 2018-06-27 DIAGNOSIS — L989 Disorder of the skin and subcutaneous tissue, unspecified: Secondary | ICD-10-CM | POA: Diagnosis not present

## 2018-06-27 LAB — CULTURE, BLOOD (ROUTINE X 2)
Culture: NO GROWTH
Culture: NO GROWTH

## 2018-06-29 ENCOUNTER — Other Ambulatory Visit: Payer: Self-pay | Admitting: *Deleted

## 2018-06-29 ENCOUNTER — Observation Stay (HOSPITAL_COMMUNITY): Payer: Medicare Other

## 2018-06-29 ENCOUNTER — Inpatient Hospital Stay (HOSPITAL_COMMUNITY)
Admission: EM | Admit: 2018-06-29 | Discharge: 2018-07-04 | DRG: 291 | Disposition: A | Discharge status: Skilled Nursing Facility | Payer: Medicare Other | Attending: Pulmonary Disease | Admitting: Pulmonary Disease

## 2018-06-29 ENCOUNTER — Other Ambulatory Visit: Payer: Self-pay

## 2018-06-29 ENCOUNTER — Encounter (HOSPITAL_COMMUNITY): Payer: Self-pay | Admitting: Student

## 2018-06-29 ENCOUNTER — Emergency Department (HOSPITAL_COMMUNITY): Payer: Medicare Other

## 2018-06-29 DIAGNOSIS — K219 Gastro-esophageal reflux disease without esophagitis: Secondary | ICD-10-CM | POA: Diagnosis present

## 2018-06-29 DIAGNOSIS — R069 Unspecified abnormalities of breathing: Secondary | ICD-10-CM | POA: Diagnosis not present

## 2018-06-29 DIAGNOSIS — Z823 Family history of stroke: Secondary | ICD-10-CM

## 2018-06-29 DIAGNOSIS — J449 Chronic obstructive pulmonary disease, unspecified: Secondary | ICD-10-CM | POA: Diagnosis present

## 2018-06-29 DIAGNOSIS — K449 Diaphragmatic hernia without obstruction or gangrene: Secondary | ICD-10-CM | POA: Diagnosis present

## 2018-06-29 DIAGNOSIS — D649 Anemia, unspecified: Secondary | ICD-10-CM | POA: Diagnosis present

## 2018-06-29 DIAGNOSIS — Z20828 Contact with and (suspected) exposure to other viral communicable diseases: Secondary | ICD-10-CM | POA: Diagnosis not present

## 2018-06-29 DIAGNOSIS — H9190 Unspecified hearing loss, unspecified ear: Secondary | ICD-10-CM | POA: Diagnosis present

## 2018-06-29 DIAGNOSIS — J189 Pneumonia, unspecified organism: Secondary | ICD-10-CM | POA: Diagnosis not present

## 2018-06-29 DIAGNOSIS — E871 Hypo-osmolality and hyponatremia: Secondary | ICD-10-CM | POA: Diagnosis present

## 2018-06-29 DIAGNOSIS — E43 Unspecified severe protein-calorie malnutrition: Secondary | ICD-10-CM | POA: Diagnosis not present

## 2018-06-29 DIAGNOSIS — I110 Hypertensive heart disease with heart failure: Secondary | ICD-10-CM | POA: Diagnosis not present

## 2018-06-29 DIAGNOSIS — E878 Other disorders of electrolyte and fluid balance, not elsewhere classified: Secondary | ICD-10-CM | POA: Diagnosis present

## 2018-06-29 DIAGNOSIS — F419 Anxiety disorder, unspecified: Secondary | ICD-10-CM | POA: Diagnosis present

## 2018-06-29 DIAGNOSIS — L89623 Pressure ulcer of left heel, stage 3: Secondary | ICD-10-CM | POA: Diagnosis present

## 2018-06-29 DIAGNOSIS — I509 Heart failure, unspecified: Secondary | ICD-10-CM | POA: Diagnosis not present

## 2018-06-29 DIAGNOSIS — Z885 Allergy status to narcotic agent status: Secondary | ICD-10-CM

## 2018-06-29 DIAGNOSIS — J9621 Acute and chronic respiratory failure with hypoxia: Secondary | ICD-10-CM | POA: Diagnosis present

## 2018-06-29 DIAGNOSIS — F17290 Nicotine dependence, other tobacco product, uncomplicated: Secondary | ICD-10-CM | POA: Diagnosis present

## 2018-06-29 DIAGNOSIS — R0602 Shortness of breath: Secondary | ICD-10-CM | POA: Diagnosis not present

## 2018-06-29 DIAGNOSIS — Z8249 Family history of ischemic heart disease and other diseases of the circulatory system: Secondary | ICD-10-CM

## 2018-06-29 DIAGNOSIS — M810 Age-related osteoporosis without current pathological fracture: Secondary | ICD-10-CM | POA: Diagnosis present

## 2018-06-29 DIAGNOSIS — Z882 Allergy status to sulfonamides status: Secondary | ICD-10-CM

## 2018-06-29 DIAGNOSIS — Y95 Nosocomial condition: Secondary | ICD-10-CM | POA: Diagnosis present

## 2018-06-29 DIAGNOSIS — R079 Chest pain, unspecified: Secondary | ICD-10-CM | POA: Diagnosis not present

## 2018-06-29 DIAGNOSIS — I5031 Acute diastolic (congestive) heart failure: Secondary | ICD-10-CM

## 2018-06-29 DIAGNOSIS — J440 Chronic obstructive pulmonary disease with acute lower respiratory infection: Secondary | ICD-10-CM | POA: Diagnosis present

## 2018-06-29 DIAGNOSIS — Z79899 Other long term (current) drug therapy: Secondary | ICD-10-CM

## 2018-06-29 DIAGNOSIS — Z9889 Other specified postprocedural states: Secondary | ICD-10-CM

## 2018-06-29 DIAGNOSIS — J441 Chronic obstructive pulmonary disease with (acute) exacerbation: Secondary | ICD-10-CM | POA: Diagnosis present

## 2018-06-29 DIAGNOSIS — Z881 Allergy status to other antibiotic agents status: Secondary | ICD-10-CM

## 2018-06-29 DIAGNOSIS — I959 Hypotension, unspecified: Secondary | ICD-10-CM | POA: Diagnosis present

## 2018-06-29 DIAGNOSIS — D473 Essential (hemorrhagic) thrombocythemia: Secondary | ICD-10-CM | POA: Diagnosis present

## 2018-06-29 DIAGNOSIS — I5033 Acute on chronic diastolic (congestive) heart failure: Secondary | ICD-10-CM | POA: Diagnosis not present

## 2018-06-29 DIAGNOSIS — K581 Irritable bowel syndrome with constipation: Secondary | ICD-10-CM | POA: Diagnosis present

## 2018-06-29 DIAGNOSIS — Z8701 Personal history of pneumonia (recurrent): Secondary | ICD-10-CM

## 2018-06-29 DIAGNOSIS — Z8781 Personal history of (healed) traumatic fracture: Secondary | ICD-10-CM

## 2018-06-29 DIAGNOSIS — F0150 Vascular dementia without behavioral disturbance: Secondary | ICD-10-CM | POA: Diagnosis present

## 2018-06-29 LAB — COMPREHENSIVE METABOLIC PANEL
ALT: 10 U/L (ref 0–44)
AST: 12 U/L — ABNORMAL LOW (ref 15–41)
Albumin: 2.3 g/dL — ABNORMAL LOW (ref 3.5–5.0)
Alkaline Phosphatase: 105 U/L (ref 38–126)
Anion gap: 11 (ref 5–15)
BUN: 14 mg/dL (ref 8–23)
CO2: 23 mmol/L (ref 22–32)
Calcium: 8.3 mg/dL — ABNORMAL LOW (ref 8.9–10.3)
Chloride: 94 mmol/L — ABNORMAL LOW (ref 98–111)
Creatinine, Ser: 0.59 mg/dL (ref 0.44–1.00)
GFR calc Af Amer: >60 mL/min (ref >60)
GFR calc non Af Amer: >60 mL/min (ref >60)
Glucose, Bld: 129 mg/dL — ABNORMAL HIGH (ref 70–99)
Potassium: 4.6 mmol/L (ref 3.5–5.1)
Sodium: 128 mmol/L — ABNORMAL LOW (ref 135–145)
Total Bilirubin: 0.3 mg/dL (ref 0.3–1.2)
Total Protein: 6.5 g/dL (ref 6.5–8.1)

## 2018-06-29 LAB — CBC WITH DIFFERENTIAL/PLATELET
Abs Immature Granulocytes: 0.14 10*3/uL — ABNORMAL HIGH (ref 0.00–0.07)
Basophils Absolute: 0 10*3/uL (ref 0.0–0.1)
Basophils Relative: 0 %
Eosinophils Absolute: 0.1 10*3/uL (ref 0.0–0.5)
Eosinophils Relative: 0 %
HCT: 29.8 % — ABNORMAL LOW (ref 36.0–46.0)
Hemoglobin: 8.8 g/dL — ABNORMAL LOW (ref 12.0–15.0)
Immature Granulocytes: 1 %
Lymphocytes Relative: 6 %
Lymphs Abs: 1 10*3/uL (ref 0.7–4.0)
MCH: 23.9 pg — ABNORMAL LOW (ref 26.0–34.0)
MCHC: 29.5 g/dL — ABNORMAL LOW (ref 30.0–36.0)
MCV: 81 fL (ref 80.0–100.0)
Monocytes Absolute: 1.1 10*3/uL — ABNORMAL HIGH (ref 0.1–1.0)
Monocytes Relative: 6 %
Neutro Abs: 15.5 10*3/uL — ABNORMAL HIGH (ref 1.7–7.7)
Neutrophils Relative %: 87 %
Platelets: 616 10*3/uL — ABNORMAL HIGH (ref 150–400)
RBC: 3.68 MIL/uL — ABNORMAL LOW (ref 3.87–5.11)
RDW: 14.6 % (ref 11.5–15.5)
WBC: 17.8 10*3/uL — ABNORMAL HIGH (ref 4.0–10.5)
nRBC: 0 % (ref 0.0–0.2)

## 2018-06-29 LAB — TROPONIN I (HIGH SENSITIVITY)
Troponin I (High Sensitivity): 3 ng/L (ref <18)
Troponin I (High Sensitivity): 3 ng/L (ref <18)

## 2018-06-29 LAB — SARS CORONAVIRUS 2 BY RT PCR (HOSPITAL ORDER, PERFORMED IN ~~LOC~~ HOSPITAL LAB): SARS Coronavirus 2: NEGATIVE

## 2018-06-29 LAB — BRAIN NATRIURETIC PEPTIDE: B Natriuretic Peptide: 172 pg/mL — ABNORMAL HIGH (ref 0.0–100.0)

## 2018-06-29 MED ORDER — IOHEXOL 350 MG/ML SOLN
100.0000 mL | Freq: Once | INTRAVENOUS | Status: AC | PRN
  Administered 2018-06-29: 20:00:00 100 mL via INTRAVENOUS

## 2018-06-29 MED ORDER — ACETAMINOPHEN 325 MG PO TABS
650.0000 mg | ORAL_TABLET | Freq: Four times a day (QID) | ORAL | Status: DC | PRN
  Administered 2018-07-01 – 2018-07-04 (×5): 650 mg via ORAL
  Filled 2018-06-29 (×6): qty 2

## 2018-06-29 MED ORDER — ONDANSETRON HCL 4 MG/2ML IJ SOLN
4.0000 mg | Freq: Four times a day (QID) | INTRAMUSCULAR | Status: DC | PRN
  Administered 2018-07-03: 4 mg via INTRAVENOUS
  Filled 2018-06-29: qty 2

## 2018-06-29 MED ORDER — POTASSIUM CHLORIDE CRYS ER 20 MEQ PO TBCR
20.0000 meq | EXTENDED_RELEASE_TABLET | Freq: Once | ORAL | Status: AC
  Administered 2018-06-29: 20 meq via ORAL
  Filled 2018-06-29: qty 1

## 2018-06-29 MED ORDER — FUROSEMIDE 10 MG/ML IJ SOLN
20.0000 mg | Freq: Once | INTRAMUSCULAR | Status: AC
  Administered 2018-06-29: 20 mg via INTRAVENOUS
  Filled 2018-06-29: qty 2

## 2018-06-29 MED ORDER — SODIUM CHLORIDE 0.9 % IV SOLN
1.0000 g | Freq: Once | INTRAVENOUS | Status: AC
  Administered 2018-06-29: 1 g via INTRAVENOUS
  Filled 2018-06-29: qty 1

## 2018-06-29 MED ORDER — VANCOMYCIN HCL IN DEXTROSE 1-5 GM/200ML-% IV SOLN
1000.0000 mg | Freq: Once | INTRAVENOUS | Status: AC
  Administered 2018-06-29: 1000 mg via INTRAVENOUS
  Filled 2018-06-29: qty 200

## 2018-06-29 MED ORDER — ENOXAPARIN SODIUM 40 MG/0.4ML ~~LOC~~ SOLN
40.0000 mg | SUBCUTANEOUS | Status: DC
  Administered 2018-06-29 – 2018-07-03 (×5): 40 mg via SUBCUTANEOUS
  Filled 2018-06-29 (×5): qty 0.4

## 2018-06-29 MED ORDER — ACETAMINOPHEN 650 MG RE SUPP: 650.0000 mg | Freq: Four times a day (QID) | RECTAL | Status: DC | PRN

## 2018-06-29 MED ORDER — ALPRAZOLAM 0.5 MG PO TABS
0.5000 mg | ORAL_TABLET | Freq: Three times a day (TID) | ORAL | Status: DC | PRN
  Administered 2018-07-02 – 2018-07-04 (×3): 0.5 mg via ORAL
  Filled 2018-06-29 (×3): qty 1

## 2018-06-29 MED ORDER — ONDANSETRON HCL 4 MG PO TABS: 4.0000 mg | ORAL_TABLET | Freq: Four times a day (QID) | ORAL | Status: DC | PRN

## 2018-06-29 MED ORDER — FENTANYL CITRATE (PF) 100 MCG/2ML IJ SOLN
12.5000 ug | Freq: Once | INTRAMUSCULAR | Status: AC
  Administered 2018-06-29: 12.5 ug via INTRAVENOUS
  Filled 2018-06-29: qty 2

## 2018-06-29 MED ORDER — ZOLPIDEM TARTRATE 5 MG PO TABS
5.0000 mg | ORAL_TABLET | Freq: Every evening | ORAL | Status: DC | PRN
  Administered 2018-07-01: 5 mg via ORAL
  Filled 2018-06-29: qty 1

## 2018-06-29 MED ORDER — ALBUTEROL SULFATE (2.5 MG/3ML) 0.083% IN NEBU: 2.5000 mg | INHALATION_SOLUTION | Freq: Four times a day (QID) | RESPIRATORY_TRACT | Status: DC | PRN

## 2018-06-29 MED ORDER — POLYETHYLENE GLYCOL 3350 17 G PO PACK: 17.0000 g | PACK | Freq: Every day | ORAL | Status: DC | PRN

## 2018-06-29 MED ORDER — LINACLOTIDE 145 MCG PO CAPS
145.0000 ug | ORAL_CAPSULE | Freq: Every day | ORAL | Status: DC | PRN
  Filled 2018-06-29: qty 1

## 2018-06-29 MED ORDER — POTASSIUM CHLORIDE ER 10 MEQ PO TBCR
10.0000 meq | EXTENDED_RELEASE_TABLET | Freq: Every day | ORAL | Status: DC
  Administered 2018-06-29 – 2018-07-04 (×6): 10 meq via ORAL
  Filled 2018-06-29 (×13): qty 1

## 2018-06-29 MED ORDER — SODIUM CHLORIDE 0.9 % IV SOLN
2.0000 g | Freq: Two times a day (BID) | INTRAVENOUS | Status: DC
  Administered 2018-06-29 – 2018-07-02 (×7): 2 g via INTRAVENOUS
  Filled 2018-06-29 (×8): qty 2

## 2018-06-29 MED ORDER — VANCOMYCIN HCL IN DEXTROSE 750-5 MG/150ML-% IV SOLN
750.0000 mg | INTRAVENOUS | Status: DC
  Administered 2018-06-30: 750 mg via INTRAVENOUS
  Filled 2018-06-29: qty 150

## 2018-06-29 MED ORDER — FUROSEMIDE 10 MG/ML IJ SOLN
20.0000 mg | Freq: Two times a day (BID) | INTRAMUSCULAR | Status: DC
  Administered 2018-06-30 – 2018-07-04 (×9): 20 mg via INTRAVENOUS
  Filled 2018-06-29 (×9): qty 2

## 2018-06-29 NOTE — ED Notes (Signed)
Unable to obtain blood work. Notified lab.

## 2018-06-29 NOTE — ED Notes (Signed)
Patient states she does not understand why she has to be admitted. Told pt the provider would explain to her the reasonings.

## 2018-06-29 NOTE — Patient Outreach (Signed)
Made aware by [facility] Director that Mrs. [name] is currently in the ED at [facility].   [name] recently discharged from [facility] on 06/25/18 upon daughter's request.   Of note, writer contacted member on 06/26/18 to discuss [name] follow up. However, [name] abruptly hung up the phone on Probation officer.   Voicemail message left for [name] ED LCSW to make aware that [name] recently discharged from [facility] on 06/25/18. Questioning if member does not warrant inpatient admission, would it be possible for her to return to [facility].    Lacey Rolling, MSN-Ed, RN,BSN [facility] Acute Care Coordinator [phone] [facility]) [phone]  (Toll free office)

## 2018-06-29 NOTE — ED Provider Notes (Signed)
[facility] EMERGENCY DEPARTMENT Provider Note   CSN: [id number] Arrival date & time: 06/29/18  1301     History   Chief Complaint Chief Complaint  Patient presents with  . Shortness of Breath    HPI [name] is a 84 y.o. female with a hx of COPD, hyperlipidemia, IBS, GERD, & vascular dementia who presents to the ED via EMS w/ complaints of dyspnea x 2 days & chest pain that began last evening. Patient states she has felt fairly dyspneic w/ exertion w/ BLE swelling over past couple of days, last night woke from sleep w/ worse sxs & substernal chest pain described as pressure/tightness. States sxs constant since onset. Worse w/ exertion, some alleviating w/ rest. Has had dry cough. Recent discharged home from the [facility] after stay 06/12-06/21 for pneumonia, has a few doses of amoxicillin left at this time, has been taking as prescribed. States she was feeling better than worsened a couple of days ago. Denies fever, chills, n/v/d, abdominal pain, unilateral leg pain, dizziness, lightheadedness, or syncope. Notes chronic upper back pain which is unchanged.   Per EMS vitals WNL. Placed on 2L via [place] for comfort.      HPI  Past Medical History:  Diagnosis Date  . Anxiety   . COPD (chronic obstructive pulmonary disease) ([place])   . GERD (gastroesophageal reflux disease)   . Hyperlipidemia   . IBS (irritable bowel syndrome)   . Osteoporosis     Patient Active Problem List   Diagnosis Date Noted  . Vascular dementia without behavioral disturbance ([place]) 06/26/2018  . CAP (community acquired pneumonia) 06/23/2018  . Bilateral pleural effusion 06/23/2018  . Leukocytosis 06/20/2018  . Chronic anemia 06/20/2018  . Cognitive impairment 06/19/2018  . Bilateral lower extremity edema 06/18/2018  . Protein-calorie malnutrition, severe ([place]) 06/18/2018  . S/P internal fixation right hip fracture cannulated screw placement 04/06/18 04/27/2018  . Urinary tract infection  associated with catheterization of urinary tract ([place]) 04/11/2018  . Altered mental status 04/10/2018  . Anxiety 04/10/2018  . Hyponatremia 04/10/2018  . UTI (urinary tract infection) due to urinary indwelling catheter ([place]) 04/10/2018  . Chronic constipation 04/09/2018  . Urinary retention 04/09/2018  . Closed right hip fracture ([place]) 04/05/2018  . GERD (gastroesophageal reflux disease) 04/05/2018  . COPD (chronic obstructive pulmonary disease) ([place]) 04/05/2018  . Hiatal hernia   . Dyspepsia 04/04/2014  . Hematochezia 04/30/2011  . RUQ pain 03/25/2011  . Adenomatous polyps 03/25/2011  . ABDOMINAL PAIN, LEFT LOWER QUADRANT 10/16/2009  . HYPERLIPIDEMIA 10/09/2009  . BRONCHITIS 10/09/2009  . Osteoporosis 10/09/2009    Past Surgical History:  Procedure Laterality Date  . COLONOSCOPY  10/2009   sigmoid diverticula, multiple tubulovillous adneomas, needs surveillance Oct 2013  . ESOPHAGOGASTRODUODENOSCOPY  04/16/2011   Dr. Gala Romney: Noncritical Schatzkis ring( not manipulated because no dysphagia). Normal esophagus otherwise  large hiatal hernia. Gastric polyp-status post biopsy. Gastric erosions-staus post biopsy. Abnormal bulb-status post biopsy. benign small bowel, stomach biopsy with ulcerated gastric antral mucosa with foveolar hyperplasia and surface erosion, polyp with inflamed gastric antral mucosa   . ESOPHAGOGASTRODUODENOSCOPY N/A 04/29/2014   Dr. Gala Romney: Noncritical Schatzki's ring large hiatal hernia. Retained gastric contents.GES with slight delay  . HIP PINNING,CANNULATED Right 04/06/2018   Procedure: CANNULATED HIP PINNING;  Surgeon: Carole Civil, MD;  Location: [facility];  Service: Orthopedics;  Laterality: Right;     OB History    Gravida  3   Para  3   Term  3   Preterm  0   AB  0   Living        SAB  0   TAB  0   Ectopic  0   Multiple      Live Births               Home Medications    Prior to Admission medications   Medication Sig Start  Date End Date Taking? Authorizing Provider  albuterol (PROAIR HFA) 108 (90 Base) MCG/ACT inhaler Inhale 2 puffs into the lungs every 4 (four) hours as needed. 06/23/18   Gerlene Fee, NP  albuterol (PROVENTIL) (2.5 MG/3ML) 0.083% nebulizer solution Take 3 mLs (2.5 mg total) by nebulization every 6 (six) hours as needed for wheezing or shortness of breath. 06/23/18   Gerlene Fee, NP  ALPRAZolam Duanne Moron) 0.5 MG tablet Take 1 tablet (0.5 mg total) by mouth 3 (three) times daily. 06/23/18   Gerlene Fee, NP  Amino Acids-Protein Hydrolys (FEEDING SUPPLEMENT, PRO-STAT SUGAR FREE 64,) LIQD Take 30 mLs by mouth 3 (three) times daily with meals. 06/16/18   [provider]  amoxicillin-clavulanate (AUGMENTIN) 875-125 MG tablet Take 1 tablet by mouth 2 (two) times daily for 6 days. X 10 days 06/25/18 07/01/18  Gerlene Fee, NP  cetirizine (ZYRTEC) 10 MG tablet Take 10 mg by mouth daily as needed for allergies. 06/16/18   [provider]  furosemide (LASIX) 40 MG tablet Take 40 mg by mouth 2 (two) times daily.  06/21/18 06/25/18  [provider]  furosemide (LASIX) 40 MG tablet Take 1 tablet (40 mg total) by mouth daily. Beginning 06/26/18 06/25/18   Gerlene Fee, NP  linaclotide Rolan Lipa) 145 MCG CAPS capsule Take 1 capsule (145 mcg total) by mouth daily as needed. 06/23/18   Gerlene Fee, NP  Memantine HCl ER (NAMENDA XR TITRATION PACK) 7 & 14 & 21 &28 MG CP24 Take 6-28 mg by mouth daily. 6/19 - 06/30/2018 = 7 mg daily 6/27 - 07/08/2018 = 14 mg daily 7/5 - 07/16/2018 - 21 mg 07/17/19 - Begin 28 mg daily 06/23/18   Gerlene Fee, NP  NON FORMULARY Diet Type:  Mechanical soft,regular diet 06/16/18   [provider]  pantoprazole (PROTONIX) 40 MG tablet Take 1 tablet (40 mg total) by mouth 2 (two) times daily before a meal. 06/23/18   Nyoka Cowden, Phylis Bougie, NP  potassium chloride (K-DUR) 10 MEQ tablet Take 1 tablet (10 mEq total) by mouth daily. 06/23/18   Gerlene Fee, NP   Probiotic Product (RISA-BID PROBIOTIC) TABS Take 1 tablet by mouth 2 (two) times a day. 06/21/18 07/04/18  [provider]  tamsulosin (FLOMAX) 0.4 MG CAPS capsule Take 1 capsule (0.4 mg total) by mouth daily. 06/23/18   Gerlene Fee, NP  traMADol (ULTRAM) 50 MG tablet Take 2 tablets (100 mg total) by mouth every 4 (four) hours as needed for up to 6 days. For 8-10/10 pain 06/23/18 06/29/18  Gerlene Fee, NP  umeclidinium-vilanterol (ANORO ELLIPTA) 62.5-25 MCG/INH AEPB Inhale 1 puff into the lungs daily. 06/23/18   Gerlene Fee, NP  UNABLE TO FIND Silver Alginate - Clean wound left medial heel with N / S, apply sliver alginate and allevyn foam dressing,  Change daily and as needed 06/16/18   [provider]  Wound Dressings (ALLEVYN ADHESIVE EX) Allevyn Foam Dressing:   Apply to kyphotic bony prominence of spine and to lesion area right upper back and  change every 3 days and as needed 06/16/18   [provider]  zolpidem (AMBIEN) 5 MG tablet Take 1 tablet (5 mg total) by mouth at bedtime as needed for up to 13 days for sleep. X 14 days 06/23/18 07/06/18  Gerlene Fee, NP    Family History Family History  Problem Relation Age of Onset  . Stroke Mother   . Diabetes Mother   . Heart disease Father   . Colon cancer Neg Hx     Social History Social History   Tobacco Use  . Smoking status: Never Smoker  . Smokeless tobacco: Current User    Types: Snuff  Substance Use Topics  . Alcohol use: No  . Drug use: No     Allergies   Codeine, Codeine, Levofloxacin, Sulfonamide derivatives, and Sulfa antibiotics   Review of Systems Review of Systems  Constitutional: Negative for chills and fever.  Respiratory: Positive for cough and shortness of breath.   Cardiovascular: Positive for chest pain and leg swelling.  Gastrointestinal: Negative for abdominal pain, diarrhea, nausea and vomiting.  Musculoskeletal: Positive for back pain (chronic unchanged).   Neurological: Negative for dizziness, syncope and light-headedness.  All other systems reviewed and are negative.    Physical Exam Updated Vital Signs BP 107/66 (BP Location: Right Arm)   Pulse (!) 104   Temp 99.3 F (37.4 C) (Oral)   Resp 17   Ht 5\' 1"  (1.549 m)   Wt 54.4 kg   SpO2 96%   BMI 22.67 kg/m   Physical Exam Vitals signs and nursing note reviewed.  Constitutional:      General: She is not in acute distress.    Appearance: She is not toxic-appearing.  HENT:     Head: Normocephalic and atraumatic.  Eyes:     General:        Right eye: No discharge.        Left eye: No discharge.     Conjunctiva/sclera: Conjunctivae normal.  Neck:     Musculoskeletal: Neck supple.  Cardiovascular:     Rate and Rhythm: Normal rate and regular rhythm.  Pulmonary:     Effort: Pulmonary effort is normal. No respiratory distress.     Comments: SpO2 96% on RA, placed on 2L for comfort as she states this makes her feel better. Course breath sounds throughout, worse at the bases.  Abdominal:     General: There is no distension.     Palpations: Abdomen is soft.     Tenderness: There is no abdominal tenderness.  Musculoskeletal:     Comments: 1+ symmetric pitting edema to the lower legs w/o overlying erythema/warmth of palpable calf tenderness.  Thoracic kyphosis noted. Small sore without significant erythema, purulent drainage, of fluctuance to the right upper thoracic area, some tenderness to palpation.   Skin:    General: Skin is warm and dry.     Findings: No rash.  Neurological:     Mental Status: She is alert.     Comments: Clear speech.   Psychiatric:        Behavior: Behavior normal.    ED Treatments / Results  Labs (all labs ordered are listed, but only abnormal results are displayed) Labs Reviewed  CBC WITH DIFFERENTIAL/PLATELET - Abnormal; Notable for the following components:      Result Value   WBC 17.8 (*)    RBC 3.68 (*)    Hemoglobin 8.8 (*)    HCT 29.8  (*)    MCH 23.9 (*)  MCHC 29.5 (*)    Platelets 616 (*)    Neutro Abs 15.5 (*)    Monocytes Absolute 1.1 (*)    Abs Immature Granulocytes 0.14 (*)    All other components within normal limits  COMPREHENSIVE METABOLIC PANEL - Abnormal; Notable for the following components:   Sodium 128 (*)    Chloride 94 (*)    Glucose, Bld 129 (*)    Calcium 8.3 (*)    Albumin 2.3 (*)    AST 12 (*)    All other components within normal limits  BRAIN NATRIURETIC PEPTIDE - Abnormal; Notable for the following components:   B Natriuretic Peptide 172.0 (*)    All other components within normal limits  SARS CORONAVIRUS 2 (HOSPITAL ORDER, [place] [facility])  MRSA PCR SCREENING  TROPONIN I (HIGH SENSITIVITY)  TROPONIN I (HIGH SENSITIVITY)    EKG   EKG Interpretation  Date/Time:  Thursday June 29 2018 13:09:34 EDT Ventricular Rate:  100 PR Interval:    QRS Duration: 106 QT Interval:  351 QTC Calculation: 453 R Axis:   72 Text Interpretation:  Sinus tachycardia Atrial premature complexes Probable left atrial enlargement RSR' in V1 or V2, probably normal variant Baseline wander in lead(s) V1 V2 Confirmed by Elnora Morrison [phone]) on 06/29/2018 1:31:25 PM   Radiology Dg Chest Portable 1 View  Result Date: 06/29/2018 CLINICAL DATA:  Chest pain and shortness of breath. EXAM: PORTABLE CHEST 1 VIEW COMPARISON:  06/20/2018 FINDINGS: The heart is mildly enlarged but stable. Fairly significant central vascular congestion and interstitial pulmonary edema with bilateral pleural effusions and bibasilar atelectasis. Large hiatal hernia. IMPRESSION: Findings consistent with CHF. Electronically Signed   By: Marijo Sanes M.D.   On: 06/29/2018 14:17    Procedures Procedures (including critical care time)  Medications Ordered in ED Medications - No data to display   Initial Impression / Assessment and Plan / ED Course  I have reviewed the triage vital signs and the nursing notes.   Pertinent labs & imaging results that were available during my care of the patient were reviewed by me and considered in my medical decision making (see chart for details).   Patient presents to the ED via EMS w/ complaints of dyspnea, BLE swelling, & chest pain. Nontoxic appearing, vitals without significant abnormality, HR ranging 90-104 on my exam, orla temp 99.3- will obtain rectal temp. Placed on O2 via [place] by EMS for comfort, not hypoxic on RA at rest. Lung sounds are course throughout, worse @ the bases. 1+ symmetric pitting edema to the lower legs w/o signs of infection or calf tenderness. DDX: CHF, ACS, COPD, PE, pneumonia, covid 53, dissection, pneumothorax. Very low dose of fentanyl ordered given pressures are on the soft side somewhat similar to prior visits. Labs, EKG, & CXR ordered.   CBC: Leukocytosis @ 17.8 w/ L shift mildly increased from prior. Anemia similar to prior. Thrombocytosis similar to prior.  CMP: Mild electrolyte abnormalities including hyponatremia, hypochloremia, hypocalcemia. No AKI. Potassium WNL.  BNP: Elevation @ 172.0.  Troponin: WNL EKG: Sinus tach, PACs, no STEMI.  CXR: The heart is mildly enlarged but stable. Fairly significant central vascular congestion and interstitial pulmonary edema with bilateral pleural effusions and bibasilar atelectasis. Large hiatal hernia- I have personally reviewed imaging.   Patient does have up-trending leukocytosis w/ low grade oral temp here, being treated for pneumonia (CXR 06/20/18 w/ new bilateral infiltrates & persistent effusions) w/ amoxicillin at SNF & continued at home, will cover  for health care associated pneumonia w/ abx in the ED - defer to hospitalist regarding continuing abx, overall presentation does seem more CHF in nature w/ CXR findings as well as signs of fluid overload on exam. Prior echocardiogram 97/6734 w/o systolic dysfunction, limited diastolic evaluation, no formal dx of CHF in patient's chart.   Will  provide low dose of lasix to start given soft pressures.  Plan for admission for diuresis & close monitoring.  This is a shared visit with supervising physician Dr. Reather Converse who has independently evaluated patient & provided guidance in evaluation/management/disposition, in agreement with care   16:21: CONSULT: Discussed w/ hospitalist Dr. Denton Brick who accepts admission.   Covid negative.   MACIEL KEGG was evaluated in Emergency Department on 06/29/2018 for the symptoms described in the history of present illness. He/she was evaluated in the context of the global COVID-19 pandemic, which necessitated consideration that the patient might be at risk for infection with the SARS-CoV-2 virus that causes COVID-19. Institutional protocols and algorithms that pertain to the evaluation of patients at risk for COVID-19 are in a state of rapid change based on information released by regulatory bodies including the CDC and federal and state organizations. These policies and algorithms were followed during the patient's care in the ED.   Final Clinical Impressions(s) / ED Diagnoses   Final diagnoses:  Acute congestive heart failure, unspecified heart failure type [facility])    ED Discharge Orders    None       Amaryllis Dyke, PA-C 06/29/18 1636    Elnora Morrison, MD 07/01/18 [phone]

## 2018-06-29 NOTE — ED Triage Notes (Signed)
Patient with SOB, chest pain since last night.  Patient refused transport last night.  Symptoms continued until today whereas daughter convinced patient to be evaluated.  Patient finishing up antibiotics for recent pneumonia. No fever reported.

## 2018-06-29 NOTE — Progress Notes (Addendum)
Pharmacy Antibiotic Note  [name] is a 84 y.o. female admitted on 06/29/2018 with pneumonia.  Pharmacy has been consulted for Vancomycin dosing.  Plan: Vancomycin 1000 mg IV x 1 dose. Vancomycin 750 mg IV every 24 hours.  Goal trough 15-20 mcg/mL.  Cefepime 2000 mg IV every 12 hours. Monitor labs, c/s, and vanco levels as indicated.  Height: 5\' 1"  (154.9 cm) Weight: 120 lb (54.4 kg) IBW/kg (Calculated) : 47.8  Temp (24hrs), Avg:99.3 F (37.4 C), Min:99.3 F (37.4 C), Max:99.3 F (37.4 C)  Recent Labs  Lab 06/29/18 1459  WBC 17.8*  CREATININE 0.59    Estimated Creatinine Clearance: 41.6 mL/min (by C-G formula based on SCr of 0.59 mg/dL).    Allergies  Allergen Reactions  . Codeine Shortness Of Breath and Rash  . Codeine Anaphylaxis  . Levofloxacin Anaphylaxis    Patient was admitted to hospital with lung problems due to this medication  . Sulfonamide Derivatives Hives and Shortness Of Breath  . Sulfa Antibiotics Hives    Antimicrobials this admission: Vanco 6/25 >>  Cefepime 6/25 >>   Dose adjustments this admission: Vanco/Cefepime  Microbiology results: MRSA PCR: pending  Thank you for allowing pharmacy to be a part of this patient's care.  Ramond Craver 06/29/2018 4:03 PM

## 2018-06-29 NOTE — H&P (Addendum)
History and Physical    [name]:[id number] DOB: February 20, 1934 DOA: 06/29/2018  PCP: [name], MD   Patient coming from: [facility]  I have personally briefly reviewed patient's old medical records in [place]  Chief Complaint: SOB, Chest Pain  HPI: [name] is a 84 y.o. female with medical history significant for COPD, hyponatremia, dementia, osteoporosis who was brought to the ED with complaints of difficulty breathing over the past 2 days, present with exertion and at rest, dry cough, also chest pain yesterday-worse with exertion improved with rest.  Per chart review chest pain has been continuous, patient tells me at this time she has no chest pain.  Unable to get full details from patient as she is mildly agitated requesting to be transferred upstairs as her bed is not comfortable. It was recently diagnosed with pneumonia at her nursing home, chest x-ray 6/16-new bilateral infiltrates and persistent effusions. She was placed on amoxicillin.  Also recent hospitalization 4/6 to 4/10 for altered mental status secondary to Pseudomonas UTI.  Also recent hip fracture 04/05/18- requiring hospitalization and surgery.  ED Course: O2 sats 96% on room air, placed on nasal cannula for comfort.  Temp 99.3, blood pressure systolic 95-621, sodium 308 (Recent baseline about 134 - 135), BNP-mildly elevated at 172, prior BNP 73 - 126.  Leukocytosis 17.8.  Stable chronic anemia at 8.8.  SARS-CoV-2 test negative.  Chest x-ray shows fairly significant central vascular congestion and interstitial pulmonary edema with bilateral pleural effusions and bibasilar atelectasis.  With hypotensive to soft blood pressure, 2 mg IV Lasix was given.  Also was a concern for lingering pneumonia considering leukocytosis and low-grade temp, IV vancomycin and cefepime was started.  Review of Systems: As per HPI all other systems reviewed and negative.  Past Medical History:  Diagnosis Date     Anxiety    COPD (chronic obstructive pulmonary disease) (HCC)    GERD (gastroesophageal reflux disease)    Hyperlipidemia    IBS (irritable bowel syndrome)    Osteoporosis     Past Surgical History:  Procedure Laterality Date   COLONOSCOPY  10/2009   sigmoid diverticula, multiple tubulovillous adneomas, needs surveillance Oct 2013   ESOPHAGOGASTRODUODENOSCOPY  04/16/2011   Dr. Gala Romney: Noncritical Schatzkis ring( not manipulated because no dysphagia). Normal esophagus otherwise  large hiatal hernia. Gastric polyp-status post biopsy. Gastric erosions-staus post biopsy. Abnormal bulb-status post biopsy. benign small bowel, stomach biopsy with ulcerated gastric antral mucosa with foveolar hyperplasia and surface erosion, polyp with inflamed gastric antral mucosa    ESOPHAGOGASTRODUODENOSCOPY N/A 04/29/2014   Dr. Gala Romney: Noncritical Schatzki's ring large hiatal hernia. Retained gastric contents.GES with slight delay   HIP PINNING,CANNULATED Right 04/06/2018   Procedure: CANNULATED HIP PINNING;  Surgeon: Carole Civil, MD;  Location: [facility];  Service: Orthopedics;  Laterality: Right;     reports that she has never smoked. Her smokeless tobacco use includes snuff. She reports that she does not drink alcohol or use drugs.  Allergies  Allergen Reactions   Codeine Shortness Of Breath and Rash   Codeine Anaphylaxis   Levofloxacin Anaphylaxis    Patient was admitted to hospital with lung problems due to this medication   Sulfonamide Derivatives Hives and Shortness Of Breath   Sulfa Antibiotics Hives    Family History  Problem Relation Age of Onset   Stroke Mother    Diabetes Mother    Heart disease Father    Colon cancer Neg Hx  Prior to Admission medications   Medication Sig Start Date End Date Taking? Authorizing Provider  albuterol (PROAIR HFA) 108 (90 Base) MCG/ACT inhaler Inhale 2 puffs into the lungs every 4 (four) hours as needed. 06/23/18   Gerlene Fee, NP  albuterol (PROVENTIL) (2.5 MG/3ML) 0.083% nebulizer solution Take 3 mLs (2.5 mg total) by nebulization every 6 (six) hours as needed for wheezing or shortness of breath. 06/23/18   Gerlene Fee, NP  ALPRAZolam Duanne Moron) 0.5 MG tablet Take 1 tablet (0.5 mg total) by mouth 3 (three) times daily. 06/23/18   Gerlene Fee, NP  Amino Acids-Protein Hydrolys (FEEDING SUPPLEMENT, PRO-STAT SUGAR FREE 64,) LIQD Take 30 mLs by mouth 3 (three) times daily with meals. 06/16/18   [provider]  amoxicillin-clavulanate (AUGMENTIN) 875-125 MG tablet Take 1 tablet by mouth 2 (two) times daily for 6 days. X 10 days 06/25/18 07/01/18  Gerlene Fee, NP  cetirizine (ZYRTEC) 10 MG tablet Take 10 mg by mouth daily as needed for allergies. 06/16/18   [provider]  furosemide (LASIX) 40 MG tablet Take 40 mg by mouth 2 (two) times daily.  06/21/18 06/25/18  [provider]  furosemide (LASIX) 40 MG tablet Take 1 tablet (40 mg total) by mouth daily. Beginning 06/26/18 06/25/18   Gerlene Fee, NP  linaclotide Rolan Lipa) 145 MCG CAPS capsule Take 1 capsule (145 mcg total) by mouth daily as needed. 06/23/18   Gerlene Fee, NP  Memantine HCl ER (NAMENDA XR TITRATION PACK) 7 & 14 & 21 &28 MG CP24 Take 6-28 mg by mouth daily. 6/19 - 06/30/2018 = 7 mg daily 6/27 - 07/08/2018 = 14 mg daily 7/5 - 07/16/2018 - 21 mg 07/17/19 - Begin 28 mg daily 06/23/18   Gerlene Fee, NP  NON FORMULARY Diet Type:  Mechanical soft,regular diet 06/16/18   [provider]  pantoprazole (PROTONIX) 40 MG tablet Take 1 tablet (40 mg total) by mouth 2 (two) times daily before a meal. 06/23/18   Nyoka Cowden, Phylis Bougie, NP  potassium chloride (K-DUR) 10 MEQ tablet Take 1 tablet (10 mEq total) by mouth daily. 06/23/18   Gerlene Fee, NP  Probiotic Product (RISA-BID PROBIOTIC) TABS Take 1 tablet by mouth 2 (two) times a day. 06/21/18 07/04/18  [provider]  tamsulosin (FLOMAX) 0.4 MG CAPS capsule Take 1  capsule (0.4 mg total) by mouth daily. 06/23/18   Gerlene Fee, NP  traMADol (ULTRAM) 50 MG tablet Take 2 tablets (100 mg total) by mouth every 4 (four) hours as needed for up to 6 days. For 8-10/10 pain 06/23/18 06/29/18  Gerlene Fee, NP  umeclidinium-vilanterol (ANORO ELLIPTA) 62.5-25 MCG/INH AEPB Inhale 1 puff into the lungs daily. 06/23/18   Gerlene Fee, NP  UNABLE TO FIND Silver Alginate - Clean wound left medial heel with N / S, apply sliver alginate and allevyn foam dressing,  Change daily and as needed 06/16/18   [provider]  Wound Dressings (ALLEVYN ADHESIVE EX) Allevyn Foam Dressing:   Apply to kyphotic bony prominence of spine and to lesion area right upper back and change every 3 days and as needed 06/16/18   [provider]  zolpidem (AMBIEN) 5 MG tablet Take 1 tablet (5 mg total) by mouth at bedtime as needed for up to 13 days for sleep. X 14 days 06/23/18 07/06/18  Gerlene Fee, NP    Physical Exam: Vitals:   06/29/18 1530 06/29/18 1615 06/29/18 1626 06/29/18  1630  BP: 97/65   114/73  Pulse: 89     Resp: 14   15  Temp:  99 F (37.2 C) 99 F (37.2 C)   TempSrc:  Rectal Rectal   SpO2: 100%   100%  Weight:      Height:        Constitutional: NAD, calm, comfortable Vitals:   06/29/18 1530 06/29/18 1615 06/29/18 1626 06/29/18 1630  BP: 97/65   114/73  Pulse: 89     Resp: 14   15  Temp:  99 F (37.2 C) 99 F (37.2 C)   TempSrc:  Rectal Rectal   SpO2: 100%   100%  Weight:      Height:       Eyes: PERRL, lids and conjunctivae normal ENMT: Mucous membranes are moist. Posterior pharynx clear of any exudate or lesions. Neck: normal, supple, no masses, no thyromegaly Respiratory: clear to auscultation bilaterally, no wheezing, no crackles. Mild Increased work of breathing, no accessory muscle use.  Cardiovascular: Regular rate and rhythm, no murmurs / rubs / gallops.  Trace bilateral pitting pedal edema to lower leg,  2+ pedal pulses.    Abdomen: no tenderness, no masses palpated. No hepatosplenomegaly. Bowel sounds positive.  Musculoskeletal: no clubbing / cyanosis. No joint deformity upper and lower extremities. Good ROM, no contractures. Normal muscle tone.  Skin: Chronic left heel ulcer measuring ~2 by 2cm, minimal drainage, without surrounding erythema or tenderness.  Neurologic: CN 2-12 grossly intact.  Strength 5/5 in all 4.  Psychiatric: Normal judgment and insight. Alert and oriented x 3. Normal mood.   Labs on Admission: I have personally reviewed following labs and imaging studies  CBC: Recent Labs  Lab 06/29/18 1459  WBC 17.8*  NEUTROABS 15.5*  HGB 8.8*  HCT 29.8*  MCV 81.0  PLT 671*   Basic Metabolic Panel: Recent Labs  Lab 06/29/18 1459  NA 128*  K 4.6  CL 94*  CO2 23  GLUCOSE 129*  BUN 14  CREATININE 0.59  CALCIUM 8.3*   Liver Function Tests: Recent Labs  Lab 06/29/18 1459  AST 12*  ALT 10  ALKPHOS 105  BILITOT 0.3  PROT 6.5  ALBUMIN 2.3*    Radiological Exams on Admission: Dg Chest Portable 1 View  Result Date: 06/29/2018 CLINICAL DATA:  Chest pain and shortness of breath. EXAM: PORTABLE CHEST 1 VIEW COMPARISON:  06/20/2018 FINDINGS: The heart is mildly enlarged but stable. Fairly significant central vascular congestion and interstitial pulmonary edema with bilateral pleural effusions and bibasilar atelectasis. Large hiatal hernia. IMPRESSION: Findings consistent with CHF. Electronically Signed   By: Marijo Sanes M.D.   On: 06/29/2018 14:17    EKG: Independently reviewed.  Sinus rhythm, QTC 453.  No significant ST or T wave abnormalities compared to prior EKG.  Assessment/Plan Active Problems:   Acute CHF (congestive heart failure) (HCC)    Shortness of breath-with chest pain, mild hypotension, leukocytosis 17.8, T-max 99.3.  Recent pneumonia diagnosis.  Hip surgery 04/2018.  High-sensitivity troponin x2.  EKG without significant ST or T wave abnormalities compared to prior.   Peripheral sign of volume overload, port Chest x-ray suggests CHF. BNP mildly elevated 172. No documented history of CHF in the past.  Last echo 08/2017- Ef  60- 65%, unable to evaluate diastolic function, PA pressure 32.  Home meds lists 40 mg Lasix twice daily.  IV 20 mg Lasix given in the ED for soft blood pressure. -CTA chest rule out PE and pneumonia. -Continue  IV Lasix 20 mg twice daily -Strict Input-output Daily weights -Hold Home tamsulosin to allow for diuresis - Addendum - Chest CTA shows- Negative for PE, Small bilateral pleural effusions with partial atelectasis at the lower lobes. Multifocal ground-glass densities within the left perihilar region, upper lobes and superior segments of the lower lobes, possible multifocal pneumonia. Diffuse bilateral septal thickening, suggesting underlying interstitial edema.  -Will continue broad-spectrum antibiotics with IV Vanco and cefepime.  Recent pneumonia-dry cough, shortness of breath.  Completing course of amoxicillin.  Started on broad-spectrum antibiotics for possible HCAP in the ED. WBC 17, T max 99.3.  -CTA- shows multifocal pneumonia and interstitial edema, will continue broad-spectrum antibiotics.  Chronic left heel ulcer-normal drainage, without any surrounding tenderness or erythema fluctuance. -Wound care consult  COPD- appears stable, no wheezing. -PRN Albuterol  Hyponatremia-likely hypovolemic , sodium 128,. -IV Lasix 20 mg twice daily - BMP a.m  Dementia, osteoporosis, IBS-appears stable. -Continue home PRN Linzess, patient not taking her memantine.   DVT prophylaxis: Lovenox Code Status: Full Family Communication: None at bedside Disposition Plan: Per rounding team Consults called: None Admission status: Obs, Tele    Bethena Roys MD [facility]  06/29/2018, 6:40 PM

## 2018-06-29 NOTE — ED Notes (Signed)
Pt daughter states that the staff on 300 were rude to her and the pt. Requested pt go to the second floor. Told daughter that pt does not meet criteria to be placed in ICU. She stated she understood.

## 2018-06-29 NOTE — ED Notes (Signed)
Attempted report to 300, unable to take report at this time.

## 2018-06-30 DIAGNOSIS — I509 Heart failure, unspecified: Secondary | ICD-10-CM | POA: Diagnosis not present

## 2018-06-30 DIAGNOSIS — D649 Anemia, unspecified: Secondary | ICD-10-CM | POA: Diagnosis present

## 2018-06-30 DIAGNOSIS — E878 Other disorders of electrolyte and fluid balance, not elsewhere classified: Secondary | ICD-10-CM | POA: Diagnosis present

## 2018-06-30 DIAGNOSIS — F419 Anxiety disorder, unspecified: Secondary | ICD-10-CM | POA: Diagnosis present

## 2018-06-30 DIAGNOSIS — Z20828 Contact with and (suspected) exposure to other viral communicable diseases: Secondary | ICD-10-CM | POA: Diagnosis present

## 2018-06-30 DIAGNOSIS — R2681 Unsteadiness on feet: Secondary | ICD-10-CM | POA: Diagnosis present

## 2018-06-30 DIAGNOSIS — I959 Hypotension, unspecified: Secondary | ICD-10-CM | POA: Diagnosis present

## 2018-06-30 DIAGNOSIS — M810 Age-related osteoporosis without current pathological fracture: Secondary | ICD-10-CM | POA: Diagnosis present

## 2018-06-30 DIAGNOSIS — Z209 Contact with and (suspected) exposure to unspecified communicable disease: Secondary | ICD-10-CM | POA: Diagnosis not present

## 2018-06-30 DIAGNOSIS — L89623 Pressure ulcer of left heel, stage 3: Secondary | ICD-10-CM | POA: Diagnosis present

## 2018-06-30 DIAGNOSIS — I5033 Acute on chronic diastolic (congestive) heart failure: Secondary | ICD-10-CM | POA: Diagnosis present

## 2018-06-30 DIAGNOSIS — R5381 Other malaise: Secondary | ICD-10-CM | POA: Diagnosis not present

## 2018-06-30 DIAGNOSIS — R69 Illness, unspecified: Secondary | ICD-10-CM | POA: Diagnosis not present

## 2018-06-30 DIAGNOSIS — F0150 Vascular dementia without behavioral disturbance: Secondary | ICD-10-CM | POA: Diagnosis present

## 2018-06-30 DIAGNOSIS — K581 Irritable bowel syndrome with constipation: Secondary | ICD-10-CM | POA: Diagnosis present

## 2018-06-30 DIAGNOSIS — Z8701 Personal history of pneumonia (recurrent): Secondary | ICD-10-CM | POA: Diagnosis not present

## 2018-06-30 DIAGNOSIS — R0602 Shortness of breath: Secondary | ICD-10-CM | POA: Diagnosis not present

## 2018-06-30 DIAGNOSIS — K219 Gastro-esophageal reflux disease without esophagitis: Secondary | ICD-10-CM | POA: Diagnosis present

## 2018-06-30 DIAGNOSIS — J189 Pneumonia, unspecified organism: Secondary | ICD-10-CM | POA: Diagnosis present

## 2018-06-30 DIAGNOSIS — J440 Chronic obstructive pulmonary disease with acute lower respiratory infection: Secondary | ICD-10-CM | POA: Diagnosis present

## 2018-06-30 DIAGNOSIS — R1311 Dysphagia, oral phase: Secondary | ICD-10-CM | POA: Diagnosis present

## 2018-06-30 DIAGNOSIS — Y95 Nosocomial condition: Secondary | ICD-10-CM | POA: Diagnosis present

## 2018-06-30 DIAGNOSIS — E43 Unspecified severe protein-calorie malnutrition: Secondary | ICD-10-CM | POA: Diagnosis present

## 2018-06-30 DIAGNOSIS — R41841 Cognitive communication deficit: Secondary | ICD-10-CM | POA: Diagnosis present

## 2018-06-30 DIAGNOSIS — H9190 Unspecified hearing loss, unspecified ear: Secondary | ICD-10-CM | POA: Diagnosis present

## 2018-06-30 DIAGNOSIS — Z823 Family history of stroke: Secondary | ICD-10-CM | POA: Diagnosis not present

## 2018-06-30 DIAGNOSIS — I5022 Chronic systolic (congestive) heart failure: Secondary | ICD-10-CM | POA: Diagnosis present

## 2018-06-30 DIAGNOSIS — I5023 Acute on chronic systolic (congestive) heart failure: Secondary | ICD-10-CM | POA: Diagnosis present

## 2018-06-30 DIAGNOSIS — M6281 Muscle weakness (generalized): Secondary | ICD-10-CM | POA: Diagnosis present

## 2018-06-30 DIAGNOSIS — Z8249 Family history of ischemic heart disease and other diseases of the circulatory system: Secondary | ICD-10-CM | POA: Diagnosis not present

## 2018-06-30 DIAGNOSIS — J9621 Acute and chronic respiratory failure with hypoxia: Secondary | ICD-10-CM | POA: Diagnosis present

## 2018-06-30 DIAGNOSIS — D473 Essential (hemorrhagic) thrombocythemia: Secondary | ICD-10-CM | POA: Diagnosis present

## 2018-06-30 DIAGNOSIS — Z7401 Bed confinement status: Secondary | ICD-10-CM | POA: Diagnosis not present

## 2018-06-30 DIAGNOSIS — K449 Diaphragmatic hernia without obstruction or gangrene: Secondary | ICD-10-CM | POA: Diagnosis present

## 2018-06-30 DIAGNOSIS — F17290 Nicotine dependence, other tobacco product, uncomplicated: Secondary | ICD-10-CM | POA: Diagnosis present

## 2018-06-30 DIAGNOSIS — E871 Hypo-osmolality and hyponatremia: Secondary | ICD-10-CM | POA: Diagnosis present

## 2018-06-30 LAB — BASIC METABOLIC PANEL
Anion gap: 10 (ref 5–15)
BUN: 12 mg/dL (ref 8–23)
CO2: 25 mmol/L (ref 22–32)
Calcium: 8.2 mg/dL — ABNORMAL LOW (ref 8.9–10.3)
Chloride: 97 mmol/L — ABNORMAL LOW (ref 98–111)
Creatinine, Ser: 0.56 mg/dL (ref 0.44–1.00)
GFR calc Af Amer: >60 mL/min (ref >60)
GFR calc non Af Amer: >60 mL/min (ref >60)
Glucose, Bld: 99 mg/dL (ref 70–99)
Potassium: 4.7 mmol/L (ref 3.5–5.1)
Sodium: 132 mmol/L — ABNORMAL LOW (ref 135–145)

## 2018-06-30 LAB — MRSA PCR SCREENING: MRSA by PCR: NEGATIVE

## 2018-06-30 MED ORDER — MUPIROCIN 2 % EX OINT
TOPICAL_OINTMENT | Freq: Every day | CUTANEOUS | Status: DC
  Administered 2018-06-30 – 2018-07-04 (×5): via TOPICAL
  Filled 2018-06-30 (×3): qty 22

## 2018-06-30 NOTE — TOC Initial Note (Signed)
Transition of Care [facility]) - Initial/Assessment Note    Patient Details  Name: [name] MRN: [id number] Date of Birth: 1934/06/29  Transition of [facility]) CM/SW Contact:    Shade Flood, LCSW Phone Number: 06/30/2018, 10:49 AM  Clinical Narrative:    Pt admitted from home. She is high readmission risk. Pt is confused. Spoke with pt's daughter, Kalman Shan, by phone to assess. Pt and [name] live together. [name] assists with ADL needs. Pt has a walker, wheelchair, hospital bed, and bedside commode for DME. Per [name], the only difficulty they really have is getting pt into the house as they have a 6-7 step entry into the home.   PT recommending SNF rehab. Pt was recently at [facility] and wasn't able to accomplish much with therapy as she was confused. [name] states that they sent her home from [facility] with [facility] from [place]. Pt is currently active with [facility]. [name] does not think pt should go to SNF. Plan to follow up Monday to further assess and assist with TOC needs.                Expected Discharge Plan: [place] Barriers to Discharge: Continued Medical Work up   Patient Goals and CMS Choice        Expected Discharge Plan and Services Expected Discharge Plan: [place] In-house Referral: Clinical Social Work     Living arrangements for the past 2 months: [place]                                      Prior Living Arrangements/Services Living arrangements for the past 2 months: [name] Lives with:: Adult Children Patient language and need for interpreter reviewed:: Yes Do you feel safe going back to the place where you live?: Yes      Need for Family Participation in Patient Care: Yes (Comment) Care giver support system in place?: Yes (comment) Current home services: DME, Home PT, Home RN Criminal Activity/Legal Involvement Pertinent to Current Situation/Hospitalization: No - Comment as  needed  Activities of Daily Living Home Assistive Devices/Equipment: Environmental consultant (specify type), Bedside commode/3-in-1, Hospital bed, Wheelchair ADL Screening (condition at time of admission) Patient's cognitive ability adequate to safely complete daily activities?: Yes Is the patient deaf or have difficulty hearing?: Yes Does the patient have difficulty seeing, even when wearing glasses/contacts?: Yes Does the patient have difficulty concentrating, remembering, or making decisions?: Yes Patient able to express need for assistance with ADLs?: Yes Does the patient have difficulty dressing or bathing?: Yes Independently performs ADLs?: No Communication: Independent Dressing (OT): Needs assistance Is this a change from baseline?: Pre-admission baseline Grooming: Needs assistance Is this a change from baseline?: Pre-admission baseline Feeding: Independent Bathing: Needs assistance Is this a change from baseline?: Pre-admission baseline Toileting: Needs assistance Is this a change from baseline?: Pre-admission baseline In/Out Bed: Needs assistance Is this a change from baseline?: Pre-admission baseline Walks in Home: Needs assistance Is this a change from baseline?: Pre-admission baseline Does the patient have difficulty walking or climbing stairs?: Yes Weakness of Legs: Both Weakness of Arms/Hands: Both  Permission Sought/Granted                  Emotional Assessment Appearance:: Appears stated age Attitude/Demeanor/Rapport: Unable to Assess Affect (typically observed): Unable to Assess Orientation: : Oriented to Self Alcohol / Substance Use: Not  Applicable Psych Involvement: No (comment)  Admission diagnosis:  Acute congestive heart failure, unspecified heart failure type [facility]) [I50.9] Patient Active Problem List   Diagnosis Date Noted  . Pneumonia 06/30/2018  . Acute CHF (congestive heart failure) ([place]) 06/29/2018  . Vascular dementia without behavioral disturbance ([place])  06/26/2018  . CAP (community acquired pneumonia) 06/23/2018  . Bilateral pleural effusion 06/23/2018  . Leukocytosis 06/20/2018  . Chronic anemia 06/20/2018  . Cognitive impairment 06/19/2018  . Bilateral lower extremity edema 06/18/2018  . Protein-calorie malnutrition, severe ([place]) 06/18/2018  . S/P internal fixation right hip fracture cannulated screw placement 04/06/18 04/27/2018  . Urinary tract infection associated with catheterization of urinary tract ([place]) 04/11/2018  . Altered mental status 04/10/2018  . Anxiety 04/10/2018  . Hyponatremia 04/10/2018  . UTI (urinary tract infection) due to urinary indwelling catheter ([place]) 04/10/2018  . Chronic constipation 04/09/2018  . Urinary retention 04/09/2018  . Closed right hip fracture ([place]) 04/05/2018  . GERD (gastroesophageal reflux disease) 04/05/2018  . COPD (chronic obstructive pulmonary disease) ([place]) 04/05/2018  . Hiatal hernia   . Dyspepsia 04/04/2014  . Hematochezia 04/30/2011  . RUQ pain 03/25/2011  . Adenomatous polyps 03/25/2011  . ABDOMINAL PAIN, LEFT LOWER QUADRANT 10/16/2009  . HYPERLIPIDEMIA 10/09/2009  . BRONCHITIS 10/09/2009  . Osteoporosis 10/09/2009   PCP:  [name], MD Pharmacy:   [facility], [place] [address] [place] [address] Phone: [phone] Fax: [phone] [place], [place] [address] [place] [address] Phone: [phone] Fax: [phone]     Social Determinants of Health (SDOH) Interventions    Readmission Risk Interventions Readmission Risk Prevention Plan 04/15/2018 04/07/2018  Transportation Screening Complete Complete  PCP or Specialist Appt within 5-7 Days Complete Complete  Home Care Screening Complete Complete  Medication Review (RN CM) Complete Complete  Some recent data might be hidden

## 2018-06-30 NOTE — Progress Notes (Signed)
Subjective: She was admitted last night with pneumonia/CHF.  She is confused this morning.  She had chest pain but EKG and troponin are reassuring.  No complaints this morning but she is confused  Objective: Vital signs in last 24 hours: Temp:  [98.1 F (36.7 C)-99.3 F (37.4 C)] 98.1 F (36.7 C) (06/26 0548) Pulse Rate:  [85-104] 93 (06/26 0548) Resp:  [14-19] 17 (06/26 0548) BP: (90-114)/(56-73) 114/68 (06/26 0548) SpO2:  [94 %-100 %] 94 % (06/26 0816) Weight:  [54.4 kg-55.8 kg] 55.8 kg (06/26 0500) Weight change:  Last BM Date: 06/28/18  Intake/Output from previous day: 06/25 0701 - 06/26 0700 In: 300 [IV Piggyback:300] Out: -   PHYSICAL EXAM General appearance: alert and Confused confused Resp: clear to auscultation bilaterally Cardio: regular rate and rhythm, S1, S2 normal, no murmur, click, rub or gallop GI: soft, non-tender; bowel sounds normal; no masses,  no organomegaly Extremities: extremities normal, atraumatic, no cyanosis or edema  Lab Results:  Results for orders placed or performed during the hospital encounter of 06/29/18 (from the past 48 hour(s))  SARS Coronavirus 2 (CEPHEID- Performed in [place] hospital lab), Hosp Order     Status: None   Collection Time: 06/29/18  1:34 PM   Specimen: Nasopharyngeal Swab  Result Value Ref Range   SARS Coronavirus 2 NEGATIVE NEGATIVE    Comment: (NOTE) If result is NEGATIVE SARS-CoV-2 target nucleic acids are NOT DETECTED. The SARS-CoV-2 RNA is generally detectable in upper and lower  respiratory specimens during the acute phase of infection. The lowest  concentration of SARS-CoV-2 viral copies this assay can detect is 250  copies / mL. A negative result does not preclude SARS-CoV-2 infection  and should not be used as the sole basis for treatment or other  patient management decisions.  A negative result may occur with  improper specimen collection / handling, submission of specimen other  than nasopharyngeal  swab, presence of viral mutation(s) within the  areas targeted by this assay, and inadequate number of viral copies  (<250 copies / mL). A negative result must be combined with clinical  observations, patient history, and epidemiological information. If result is POSITIVE SARS-CoV-2 target nucleic acids are DETECTED. The SARS-CoV-2 RNA is generally detectable in upper and lower  respiratory specimens dur ing the acute phase of infection.  Positive  results are indicative of active infection with SARS-CoV-2.  Clinical  correlation with patient history and other diagnostic information is  necessary to determine patient infection status.  Positive results do  not rule out bacterial infection or co-infection with other viruses. If result is PRESUMPTIVE POSTIVE SARS-CoV-2 nucleic acids MAY BE PRESENT.   A presumptive positive result was obtained on the submitted specimen  and confirmed on repeat testing.  While 2019 novel coronavirus  (SARS-CoV-2) nucleic acids may be present in the submitted sample  additional confirmatory testing may be necessary for epidemiological  and / or clinical management purposes  to differentiate between  SARS-CoV-2 and other Sarbecovirus currently known to infect humans.  If clinically indicated additional testing with an alternate test  methodology [phone]) is advised. The SARS-CoV-2 RNA is generally  detectable in upper and lower respiratory sp ecimens during the acute  phase of infection. The expected result is Negative. Fact Sheet for Patients:  StrictlyIdeas.no Fact Sheet for Healthcare Providers: BankingDealers.co.za This test is not yet approved or cleared by the [place] FDA and has been authorized for detection and/or diagnosis of SARS-CoV-2 by FDA under an Emergency Use  Authorization (EUA).  This EUA will remain in effect (meaning this test can be used) for the duration of the COVID-19 declaration  under Section 564(b)(1) of the Act, 21 U.S.C. section 360bbb-3(b)(1), unless the authorization is terminated or revoked sooner. Performed at [facility], [address], [place], [place] [address]   CBC with Differential     Status: Abnormal   Collection Time: 06/29/18  2:59 PM  Result Value Ref Range   WBC 17.8 (H) 4.0 - 10.5 K/uL   RBC 3.68 (L) 3.87 - 5.11 MIL/uL   Hemoglobin 8.8 (L) 12.0 - 15.0 g/dL   HCT 29.8 (L) 36.0 - 46.0 %   MCV 81.0 80.0 - 100.0 fL   MCH 23.9 (L) 26.0 - 34.0 pg   MCHC 29.5 (L) 30.0 - 36.0 g/dL   RDW 14.6 11.5 - 15.5 %   Platelets 616 (H) 150 - 400 K/uL   nRBC 0.0 0.0 - 0.2 %   Neutrophils Relative % 87 %   Neutro Abs 15.5 (H) 1.7 - 7.7 K/uL   Lymphocytes Relative 6 %   Lymphs Abs 1.0 0.7 - 4.0 K/uL   Monocytes Relative 6 %   Monocytes Absolute 1.1 (H) 0.1 - 1.0 K/uL   Eosinophils Relative 0 %   Eosinophils Absolute 0.1 0.0 - 0.5 K/uL   Basophils Relative 0 %   Basophils Absolute 0.0 0.0 - 0.1 K/uL   Immature Granulocytes 1 %   Abs Immature Granulocytes 0.14 (H) 0.00 - 0.07 K/uL    Comment: Performed at [facility], [address], [place], [place] [address]  Comprehensive metabolic panel     Status: Abnormal   Collection Time: 06/29/18  2:59 PM  Result Value Ref Range   Sodium 128 (L) 135 - 145 mmol/L   Potassium 4.6 3.5 - 5.1 mmol/L   Chloride 94 (L) 98 - 111 mmol/L   CO2 23 22 - 32 mmol/L   Glucose, Bld 129 (H) 70 - 99 mg/dL   BUN 14 8 - 23 mg/dL   Creatinine, Ser 0.59 0.44 - 1.00 mg/dL   Calcium 8.3 (L) 8.9 - 10.3 mg/dL   Total Protein 6.5 6.5 - 8.1 g/dL   Albumin 2.3 (L) 3.5 - 5.0 g/dL   AST 12 (L) 15 - 41 U/L   ALT 10 0 - 44 U/L   Alkaline Phosphatase 105 38 - 126 U/L   Total Bilirubin 0.3 0.3 - 1.2 mg/dL   GFR calc non Af Amer >60 >60 mL/min   GFR calc Af Amer >60 >60 mL/min   Anion gap 11 5 - 15    Comment: Performed at [facility], [address], [place], [place] [address]  Brain natriuretic peptide     Status: Abnormal    Collection Time: 06/29/18  2:59 PM  Result Value Ref Range   B Natriuretic Peptide 172.0 (H) 0.0 - 100.0 pg/mL    Comment: Performed at [facility], [address] [place], [place] [address]  Troponin I (High Sensitivity)     Status: None   Collection Time: 06/29/18  2:59 PM  Result Value Ref Range   Troponin I (High Sensitivity) 3.00 <18 ng/L    Comment: (NOTE) Elevated high sensitivity troponin I (hsTnI) values and significant  changes across serial measurements may suggest ACS but many other  chronic and acute conditions are known to elevate hsTnI results.  Refer to the "Links" section for chest pain algorithms and additional  guidance. Performed at [facility], [id number]  [address] [place], [place] [address]   Troponin I (High Sensitivity)     Status: None   Collection Time: 06/29/18  5:04 PM  Result Value Ref Range   Troponin I (High Sensitivity) 3.00 <18 ng/L    Comment: (NOTE) Elevated high sensitivity troponin I (hsTnI) values and significant  changes across serial measurements may suggest ACS but many other  chronic and acute conditions are known to elevate hsTnI results.  Refer to the "Links" section for chest pain algorithms and additional  guidance. Performed at [facility], [address] [place], [place] [address]   Basic metabolic panel     Status: Abnormal   Collection Time: 06/30/18  4:47 AM  Result Value Ref Range   Sodium 132 (L) 135 - 145 mmol/L   Potassium 4.7 3.5 - 5.1 mmol/L   Chloride 97 (L) 98 - 111 mmol/L   CO2 25 22 - 32 mmol/L   Glucose, Bld 99 70 - 99 mg/dL   BUN 12 8 - 23 mg/dL   Creatinine, Ser 0.56 0.44 - 1.00 mg/dL   Calcium 8.2 (L) 8.9 - 10.3 mg/dL   GFR calc non Af Amer >60 >60 mL/min   GFR calc Af Amer >60 >60 mL/min   Anion gap 10 5 - 15    Comment: Performed at [facility], [address], [place], [place] [address]  MRSA PCR Screening     Status: None   Collection Time: 06/30/18  5:48 AM   Specimen: Nasopharyngeal  Result Value Ref  Range   MRSA by PCR NEGATIVE NEGATIVE    Comment:        The GeneXpert MRSA Assay (FDA approved for NASAL specimens only), is one component of a comprehensive MRSA colonization surveillance program. It is not intended to diagnose MRSA infection nor to guide or monitor treatment for MRSA infections. Performed at [facility] [place], [address], [place], [place] [address]     ABGS No results for input(s): PHART, PO2ART, TCO2, HCO3 in the last 72 hours.  Invalid input(s): PCO2 CULTURES Recent Results (from the past 240 hour(s))  Culture, Urine     Status: None   Collection Time: 06/20/18  3:31 PM   Specimen: Urine, Catheterized  Result Value Ref Range Status   Specimen Description   Final    URINE, CATHETERIZED Performed at [facility], [address], [place], [place] [address]    Special Requests   Final    NONE Performed at [facility], [address], [place], [place] [address]    Culture   Final    NO GROWTH Performed at [facility], [place] [address] [place], [place] [address]    Report Status 06/21/2018 FINAL  Final  Culture, blood (Routine X 2) w Reflex to ID Panel     Status: None   Collection Time: 06/21/18 11:50 AM   Specimen: BLOOD RIGHT ARM  Result Value Ref Range Status   Specimen Description BLOOD RIGHT ARM DRAWN BY RN  Final   Special Requests   Final    BOTTLES DRAWN AEROBIC ONLY Blood Culture results may not be optimal due to an inadequate volume of blood received in culture bottles   Culture   Final    NO GROWTH 6 DAYS Performed at [facility], [address] [place], [place] [address]    Report Status 06/27/2018 FINAL  Final  Culture, blood (Routine X 2) w Reflex to ID Panel     Status: None   Collection  Time: 06/21/18 11:50 AM   Specimen: BLOOD LEFT ARM  Result Value Ref Range Status   Specimen Description BLOOD LEFT ARM DRAWN BY RN  Final   Special Requests ANAEROBIC BOTTLE ONLY  Final   Culture   Final    NO GROWTH 6 DAYS Performed at  [facility], [address], [place], [place] [address]    Report Status 06/27/2018 FINAL  Final  SARS Coronavirus 2 (CEPHEID- Performed in [place] hospital lab), Hosp Order     Status: None   Collection Time: 06/29/18  1:34 PM   Specimen: Nasopharyngeal Swab  Result Value Ref Range Status   SARS Coronavirus 2 NEGATIVE NEGATIVE Final    Comment: (NOTE) If result is NEGATIVE SARS-CoV-2 target nucleic acids are NOT DETECTED. The SARS-CoV-2 RNA is generally detectable in upper and lower  respiratory specimens during the acute phase of infection. The lowest  concentration of SARS-CoV-2 viral copies this assay can detect is 250  copies / mL. A negative result does not preclude SARS-CoV-2 infection  and should not be used as the sole basis for treatment or other  patient management decisions.  A negative result may occur with  improper specimen collection / handling, submission of specimen other  than nasopharyngeal swab, presence of viral mutation(s) within the  areas targeted by this assay, and inadequate number of viral copies  (<250 copies / mL). A negative result must be combined with clinical  observations, patient history, and epidemiological information. If result is POSITIVE SARS-CoV-2 target nucleic acids are DETECTED. The SARS-CoV-2 RNA is generally detectable in upper and lower  respiratory specimens dur ing the acute phase of infection.  Positive  results are indicative of active infection with SARS-CoV-2.  Clinical  correlation with patient history and other diagnostic information is  necessary to determine patient infection status.  Positive results do  not rule out bacterial infection or co-infection with other viruses. If result is PRESUMPTIVE POSTIVE SARS-CoV-2 nucleic acids MAY BE PRESENT.   A presumptive positive result was obtained on the submitted specimen  and confirmed on repeat testing.  While 2019 novel coronavirus  (SARS-CoV-2) nucleic acids may be present  in the submitted sample  additional confirmatory testing may be necessary for epidemiological  and / or clinical management purposes  to differentiate between  SARS-CoV-2 and other Sarbecovirus currently known to infect humans.  If clinically indicated additional testing with an alternate test  methodology [phone]) is advised. The SARS-CoV-2 RNA is generally  detectable in upper and lower respiratory sp ecimens during the acute  phase of infection. The expected result is Negative. Fact Sheet for Patients:  StrictlyIdeas.no Fact Sheet for Healthcare Providers: BankingDealers.co.za This test is not yet approved or cleared by the [place] FDA and has been authorized for detection and/or diagnosis of SARS-CoV-2 by FDA under an Emergency Use Authorization (EUA).  This EUA will remain in effect (meaning this test can be used) for the duration of the COVID-19 declaration under Section 564(b)(1) of the Act, 21 U.S.C. section 360bbb-3(b)(1), unless the authorization is terminated or revoked sooner. Performed at [facility], [address] [place], [place] [address]   MRSA PCR Screening     Status: None   Collection Time: 06/30/18  5:48 AM   Specimen: Nasopharyngeal  Result Value Ref Range Status   MRSA by PCR NEGATIVE NEGATIVE Final    Comment:        The GeneXpert MRSA Assay (FDA approved for NASAL specimens only), is one component  of a comprehensive MRSA colonization surveillance program. It is not intended to diagnose MRSA infection nor to guide or monitor treatment for MRSA infections. Performed at [facility], [address], [place], [place] [address]    Studies/Results: Ct Angio Chest Pe W Or Wo Contrast  Result Date: 06/29/2018 CLINICAL DATA:  Shortness of breath and chest pain EXAM: CT ANGIOGRAPHY CHEST WITH CONTRAST TECHNIQUE: Multidetector CT imaging of the chest was performed using the standard protocol during bolus  administration of intravenous contrast. Multiplanar CT image reconstructions and MIPs were obtained to evaluate the vascular anatomy. CONTRAST:  128mL OMNIPAQUE IOHEXOL 350 MG/ML SOLN COMPARISON:  Radiograph 06/29/2018, CT 02/17/2018 FINDINGS: Cardiovascular: Satisfactory opacification of the pulmonary arteries to the segmental level. No evidence of pulmonary embolism. Nonaneurysmal aorta. No dissection is seen. Mild to moderate aortic atherosclerosis. Tortuous aorta. Coronary vascular calcifications. Heart size upper normal. No pericardial effusion. Mediastinum/Nodes: Midline trachea. No suspicious thyroid mass. No significant adenopathy. Thickened appearance of the distal esophagus. Large hiatal hernia containing most of the stomach, mesenteric fat, and some of the colon. This is increased compared to prior CT. Lungs/Pleura: Small bilateral pleural effusions which are new. Partial atelectasis within the lower lobes. Mild subpleural fibrosis within the right middle lobe lingula and lower lobes. Peribronchial ground-glass density in the left upper lobe and superior segment of left lower lobe with additional scattered foci of ground-glass density in the right upper lobe and superior segment right lower lobe. Increased septal thickening, suggesting mild underlying edema. Upper Abdomen: No acute abnormality. Musculoskeletal: Kyphosis of the spine. Multiple chronic compression fractures T6 through T9 and at T11 with increased loss of height of T11 vertebral body since 02/17/2018 CT. Review of the MIP images confirms the above findings. IMPRESSION: 1. Negative for acute pulmonary embolus or aortic dissection. 2. Small bilateral pleural effusions with partial atelectasis at the lower lobes. Multifocal ground-glass densities within the left perihilar region, upper lobes and superior segments of the lower lobes, possible multifocal pneumonia. Diffuse bilateral septal thickening, suggesting underlying interstitial edema. 3.  Large hiatal hernia containing stomach, mesentery and colon. This appears slightly increased compared to prior CT Aortic aneurysm NOS (ICD10-I71.9). Electronically Signed   By: Donavan Foil M.D.   On: 06/29/2018 20:45   Dg Chest Portable 1 View  Result Date: 06/29/2018 CLINICAL DATA:  Chest pain and shortness of breath. EXAM: PORTABLE CHEST 1 VIEW COMPARISON:  06/20/2018 FINDINGS: The heart is mildly enlarged but stable. Fairly significant central vascular congestion and interstitial pulmonary edema with bilateral pleural effusions and bibasilar atelectasis. Large hiatal hernia. IMPRESSION: Findings consistent with CHF. Electronically Signed   By: Marijo Sanes M.D.   On: 06/29/2018 14:17    Medications:  Prior to Admission:  Medications Prior to Admission  Medication Sig Dispense Refill Last Dose  . albuterol (PROAIR HFA) 108 (90 Base) MCG/ACT inhaler Inhale 2 puffs into the lungs every 4 (four) hours as needed. 8 g 0 06/29/2018 at Unknown time  . albuterol (PROVENTIL) (2.5 MG/3ML) 0.083% nebulizer solution Take 3 mLs (2.5 mg total) by nebulization every 6 (six) hours as needed for wheezing or shortness of breath. 75 mL 0 06/29/2018 at Unknown time  . ALPRAZolam (XANAX) 0.5 MG tablet Take 1 tablet (0.5 mg total) by mouth 3 (three) times daily. 45 tablet 0 06/28/2018 at Unknown time  . amoxicillin-clavulanate (AUGMENTIN) 875-125 MG tablet Take 1 tablet by mouth 2 (two) times daily for 6 days. X 10 days 12 tablet 0 06/29/2018 at Unknown time  .  cetirizine (ZYRTEC) 10 MG tablet Take 10 mg by mouth daily as needed for allergies.   06/29/2018 at Unknown time  . furosemide (LASIX) 40 MG tablet Take 1 tablet (40 mg total) by mouth daily. Beginning 06/26/18 30 tablet 0 06/29/2018 at Unknown time  . linaclotide (LINZESS) 145 MCG CAPS capsule Take 1 capsule (145 mcg total) by mouth daily as needed. 30 capsule 0 06/28/2018 at Unknown time  . mupirocin ointment (BACTROBAN) 2 % Apply 1 application topically 2 (two)  times daily.    06/28/2018 at Unknown time  . potassium chloride (K-DUR) 10 MEQ tablet Take 1 tablet (10 mEq total) by mouth daily. 30 tablet 0 06/28/2018 at Unknown time  . Probiotic Product (RISA-BID PROBIOTIC) TABS Take 1 tablet by mouth 2 (two) times a day.   06/28/2018 at Unknown time  . tamsulosin (FLOMAX) 0.4 MG CAPS capsule Take 1 capsule (0.4 mg total) by mouth daily. 30 capsule 0 06/28/2018 at Unknown time  . [EXPIRED] traMADol (ULTRAM) 50 MG tablet Take 2 tablets (100 mg total) by mouth every 4 (four) hours as needed for up to 6 days. For 8-10/10 pain 14 tablet 0 06/29/2018 at Unknown time  . zolpidem (AMBIEN) 5 MG tablet Take 1 tablet (5 mg total) by mouth at bedtime as needed for up to 13 days for sleep. X 14 days 5 tablet 0 06/28/2018 at Unknown time  . Amino Acids-Protein Hydrolys (FEEDING SUPPLEMENT, PRO-STAT SUGAR FREE 64,) LIQD Take 30 mLs by mouth 3 (three) times daily with meals.   Not Taking at Unknown time  . furosemide (LASIX) 40 MG tablet Take 40 mg by mouth 2 (two) times daily.      . Memantine HCl ER (NAMENDA XR TITRATION PACK) 7 & 14 & 21 &28 MG CP24 Take 6-28 mg by mouth daily. 6/19 - 06/30/2018 = 7 mg daily 6/27 - 07/08/2018 = 14 mg daily 7/5 - 07/16/2018 - 21 mg 07/17/19 - Begin 28 mg daily (Patient not taking: Reported on 06/29/2018) 30 capsule 0 Not Taking at Unknown time  . NON FORMULARY Diet Type:  Mechanical soft,regular diet   Not Taking at Unknown time  . pantoprazole (PROTONIX) 40 MG tablet Take 1 tablet (40 mg total) by mouth 2 (two) times daily before a meal. (Patient not taking: Reported on 06/29/2018) 60 tablet 0 Not Taking at Unknown time  . umeclidinium-vilanterol (ANORO ELLIPTA) 62.5-25 MCG/INH AEPB Inhale 1 puff into the lungs daily. (Patient not taking: Reported on 06/29/2018) 60 each 0 Not Taking at Unknown time  . UNABLE TO FIND Silver Alginate - Clean wound left medial heel with N / S, apply sliver alginate and allevyn foam dressing,  Change daily and as needed   Not  Taking at Unknown time  . Wound Dressings (ALLEVYN ADHESIVE EX) Allevyn Foam Dressing:   Apply to kyphotic bony prominence of spine and to lesion area right upper back and change every 3 days and as needed      Scheduled: . enoxaparin (LOVENOX) injection  40 mg Subcutaneous Q24H  . furosemide  20 mg Intravenous Q12H  . potassium chloride  10 mEq Oral Daily   Continuous: . ceFEPime (MAXIPIME) IV 2 g (06/29/18 2233)  . vancomycin     MCN:[id number] **OR** acetaminophen, albuterol, ALPRAZolam, linaclotide, ondansetron **OR** ondansetron (ZOFRAN) IV, polyethylene glycol, zolpidem  Assesment: She was admitted with heart failure and probably some element of pneumonia.  She is being treated appropriately.  She did have chest pain but EKG and troponin  are okay.  She has not done well for the last several months.  She had a hip fracture recently went to the [name] was confused and unable to participate in rehab so she was discharged. Active Problems:   Acute CHF (congestive heart failure) (HCC)    Plan: Continue gentle diuresis.  Her blood pressure was low.  Continue antibiotics.  PT consult.  Full admission    LOS: 0 days   Alonza Bogus 06/30/2018, 8:38 AM

## 2018-06-30 NOTE — Evaluation (Signed)
Physical Therapy Evaluation Patient Details Name: [name] MRN: [id number] DOB: 05/24/1934 Today's Date: 06/30/2018   History of Present Illness  [name] is a 84 y.o. female with medical history significant for COPD, hyponatremia, dementia, osteoporosis who was brought to the ED with complaints of difficulty breathing over the past 2 days, present with exertion and at rest, dry cough, also chest pain yesterday-worse with exertion improved with rest.  Per chart review chest pain has been continuous, patient tells me at this time she has no chest pain.  Unable to get full details from patient as she is mildly agitated requesting to be transferred upstairs as her bed is not comfortable.It was recently diagnosed with pneumonia at her nursing home, chest x-ray 6/16-new bilateral infiltrates and persistent effusions. She was placed on amoxicillin.    Clinical Impression  Patient limited for functional mobility as stated below secondary to BLE weakness, fatigue and poor standing balance.  Patient transferred to [facility] with unsteady movement having to lean on nearby objects for support, incontinent of urine upon standing, limited for ambulation mostly due to c/o fatigue and difficulty breathing although SpO2 at 93-95% while on room air, patient put back on 2 LPM O2 - RN notified.  Patient will benefit from continued physical therapy in hospital and recommended venue below to increase strength, balance, endurance for safe ADLs and gait.     Follow Up Recommendations SNF;Supervision for mobility/OOB;Supervision - Intermittent    Equipment Recommendations  None recommended by PT    Recommendations for Other Services       Precautions / Restrictions Precautions Precautions: Fall Restrictions Weight Bearing Restrictions: No      Mobility  Bed Mobility Overal bed mobility: Needs Assistance Bed Mobility: Supine to Sit     Supine to sit: Min assist;Min guard     General bed mobility  comments: increased time, labored movement  Transfers Overall transfer level: Needs assistance Equipment used: Rolling walker (2 wheeled) Transfers: Sit to/from [facility] Sit to Stand: Min assist Stand pivot transfers: Min assist       General transfer comment: slow labored unsteady movement to transfr to [facility] an chair  Ambulation/Gait Ambulation/Gait assistance: Min assist;Min guard Gait Distance (Feet): 30 Feet Assistive device: Rolling walker (2 wheeled) Gait Pattern/deviations: Decreased step length - right;Decreased step length - left;Decreased stride length Gait velocity: decreased   General Gait Details: slow labored cadence without loss of balance, limited mostly due to c/o fatigue and mild difficulty breathing, on room air with SpO2 between 93-95%  Stairs            Wheelchair Mobility    Modified Rankin (Stroke Patients Only)       Balance Overall balance assessment: Needs assistance Sitting-balance support: Feet supported;No upper extremity supported Sitting balance-Leahy Scale: Good     Standing balance support: During functional activity;No upper extremity supported Standing balance-Leahy Scale: Poor Standing balance comment: fair using RW                             Pertinent Vitals/Pain Pain Assessment: No/denies pain    Home Living Family/patient expects to be discharged to:: Private residence Living Arrangements: Children Available Help at Discharge: Family;Available 24 hours/day Type of Home: Mobile home Home Access: Stairs to enter Entrance Stairs-Rails: Left;Right;Can reach both Entrance Stairs-Number of Steps: 5 Home Layout: One level Home Equipment: Walker - 4 wheels;Bedside commode;Wheelchair - manual;Hospital bed;Cane - single point  Prior Function Level of Independence: Needs assistance   Gait / Transfers Assistance Needed: household ambulator with RW  ADL's / Homemaking Assistance Needed:  assisted by family        Hand Dominance   Dominant Hand: Right    Extremity/Trunk Assessment   Upper Extremity Assessment Upper Extremity Assessment: Generalized weakness    Lower Extremity Assessment Lower Extremity Assessment: Generalized weakness    Cervical / Trunk Assessment Cervical / Trunk Assessment: Kyphotic  Communication   Communication: HOH  Cognition Arousal/Alertness: Awake/alert Behavior During Therapy: WFL for tasks assessed/performed Overall Cognitive Status: History of cognitive impairments - at baseline                                        General Comments      Exercises     Assessment/Plan    PT Assessment Patient needs continued PT services  PT Problem List Decreased strength;Decreased activity tolerance;Decreased balance;Decreased mobility       PT Treatment Interventions Balance training;Gait training;Stair training;Functional mobility training;Therapeutic activities;Therapeutic exercise;Patient/family education    PT Goals (Current goals can be found in the Care Plan section)  Acute Rehab PT Goals Patient Stated Goal: return home PT Goal Formulation: With patient Time For Goal Achievement: 07/14/18 Potential to Achieve Goals: Good    Frequency Min 3X/week   Barriers to discharge        Co-evaluation               AM-PAC PT "6 Clicks" Mobility  Outcome Measure Help needed turning from your back to your side while in a flat bed without using bedrails?: A Little Help needed moving from lying on your back to sitting on the side of a flat bed without using bedrails?: A Little Help needed moving to and from a bed to a chair (including a wheelchair)?: A Little Help needed standing up from a chair using your arms (e.g., wheelchair or bedside chair)?: A Little Help needed to walk in hospital room?: A Little Help needed climbing 3-5 steps with a railing? : A Lot 6 Click Score: 17    End of Session Equipment  Utilized During Treatment: Oxygen Activity Tolerance: Patient tolerated treatment well;Patient limited by fatigue Patient left: in chair;with call bell/phone within reach;with chair alarm set Nurse Communication: Mobility status PT Visit Diagnosis: Unsteadiness on feet (R26.81);Other abnormalities of gait and mobility (R26.89);Muscle weakness (generalized) (M62.81)    Time: 6659-9357 PT Time Calculation (min) (ACUTE ONLY): 32 min   Charges:   PT Evaluation $PT Eval Moderate Complexity: 1 Mod PT Treatments $Therapeutic Activity: 23-37 mins        3:10 PM, 06/30/18 Lonell Grandchild, MPT Physical Therapist with [facility] [phone] office [phone] mobile phone

## 2018-06-30 NOTE — Plan of Care (Signed)
  Problem: Acute Rehab PT Goals(only PT should resolve) Goal: Pt Will Go Supine/Side To Sit Outcome: Progressing Flowsheets (Taken 06/30/2018 1512) Pt will go Supine/Side to Sit: with supervision Goal: Patient Will Transfer Sit To/From Stand Outcome: Progressing Flowsheets (Taken 06/30/2018 1512) Patient will transfer sit to/from stand: with min guard assist Goal: Pt Will Transfer Bed To Chair/Chair To Bed Outcome: Progressing Flowsheets (Taken 06/30/2018 1512) Pt will Transfer Bed to Chair/Chair to Bed: min guard assist Goal: Pt Will Ambulate Outcome: Progressing Flowsheets (Taken 06/30/2018 1512) Pt will Ambulate:  50 feet  with min guard assist  with rolling walker   3:12 PM, 06/30/18 Lonell Grandchild, MPT Physical Therapist with [facility] [phone] office [phone] mobile phone

## 2018-06-30 NOTE — Consult Note (Signed)
[facility] Nurse wound consult note Reason for Consult: Stage 3 pressure injury to left heel.   Wound type:stage 3 pressure injury Pressure Injury POA: Yes Measurement: 2 cm x 2 cm x 0.2 cm  Wound WLN:LGXQ and nongranulating Drainage (amount, consistency, odor) minimal serosanguinous  No odor Periwound:intact  No erythema or fluctuance Dressing procedure/placement/frequency:Cleanse left heel wound with NS and pat dry. Apply mupirocin ointment daily and cover with dry dressing. Change daily.  Bilateral Prevalon boots to offload pressure.  Will not follow at this time.  Please re-consult if needed.  Domenic Moras MSN, RN, FNP-BC CWON Wound, Ostomy, Continence Nurse Pager [phone]

## 2018-07-01 ENCOUNTER — Other Ambulatory Visit: Payer: Self-pay

## 2018-07-01 MED ORDER — KETOROLAC TROMETHAMINE 30 MG/ML IJ SOLN
30.0000 mg | Freq: Once | INTRAMUSCULAR | Status: AC
  Administered 2018-07-01: 30 mg via INTRAVENOUS
  Filled 2018-07-01: qty 1

## 2018-07-01 NOTE — Progress Notes (Signed)
Subjective: She is much better today.  More alert.  Very hard of hearing which makes communication difficult.  No complaints.  She wants to know when she can go home.  Objective: Vital signs in last 24 hours: Temp:  [98.1 F (36.7 C)-98.2 F (36.8 C)] 98.1 F (36.7 C) (06/27 0516) Pulse Rate:  [83-89] 83 (06/27 0518) Resp:  [16-17] 16 (06/27 0516) BP: (100-104)/(62-69) 104/69 (06/27 0518) SpO2:  [96 %-100 %] 96 % (06/27 0839) Weight:  [55.7 kg] 55.7 kg (06/27 0500) Weight change: 1.268 kg Last BM Date: 06/28/18  Intake/Output from previous day: 06/26 0701 - 06/27 0700 In: 182.3 [IV Piggyback:182.3] Out: 1200 [Urine:1200]  PHYSICAL EXAM General appearance: alert, cooperative and no distress Resp: rhonchi bilaterally Cardio: regular rate and rhythm, S1, S2 normal, no murmur, click, rub or gallop GI: soft, non-tender; bowel sounds normal; no masses,  no organomegaly Extremities: Trace edema  Lab Results:  Results for orders placed or performed during the hospital encounter of 06/29/18 (from the past 48 hour(s))  SARS Coronavirus 2 (CEPHEID- Performed in [place] hospital lab), Hosp Order     Status: None   Collection Time: 06/29/18  1:34 PM   Specimen: Nasopharyngeal Swab  Result Value Ref Range   SARS Coronavirus 2 NEGATIVE NEGATIVE    Comment: (NOTE) If result is NEGATIVE SARS-CoV-2 target nucleic acids are NOT DETECTED. The SARS-CoV-2 RNA is generally detectable in upper and lower  respiratory specimens during the acute phase of infection. The lowest  concentration of SARS-CoV-2 viral copies this assay can detect is 250  copies / mL. A negative result does not preclude SARS-CoV-2 infection  and should not be used as the sole basis for treatment or other  patient management decisions.  A negative result may occur with  improper specimen collection / handling, submission of specimen other  than nasopharyngeal swab, presence of viral mutation(s) within the  areas  targeted by this assay, and inadequate number of viral copies  (<250 copies / mL). A negative result must be combined with clinical  observations, patient history, and epidemiological information. If result is POSITIVE SARS-CoV-2 target nucleic acids are DETECTED. The SARS-CoV-2 RNA is generally detectable in upper and lower  respiratory specimens dur ing the acute phase of infection.  Positive  results are indicative of active infection with SARS-CoV-2.  Clinical  correlation with patient history and other diagnostic information is  necessary to determine patient infection status.  Positive results do  not rule out bacterial infection or co-infection with other viruses. If result is PRESUMPTIVE POSTIVE SARS-CoV-2 nucleic acids MAY BE PRESENT.   A presumptive positive result was obtained on the submitted specimen  and confirmed on repeat testing.  While 2019 novel coronavirus  (SARS-CoV-2) nucleic acids may be present in the submitted sample  additional confirmatory testing may be necessary for epidemiological  and / or clinical management purposes  to differentiate between  SARS-CoV-2 and other Sarbecovirus currently known to infect humans.  If clinically indicated additional testing with an alternate test  methodology [phone]) is advised. The SARS-CoV-2 RNA is generally  detectable in upper and lower respiratory sp ecimens during the acute  phase of infection. The expected result is Negative. Fact Sheet for Patients:  StrictlyIdeas.no Fact Sheet for Healthcare Providers: BankingDealers.co.za This test is not yet approved or cleared by the [place] FDA and has been authorized for detection and/or diagnosis of SARS-CoV-2 by FDA under an Emergency Use Authorization (EUA).  This EUA will remain in effect (  meaning this test can be used) for the duration of the COVID-19 declaration under Section 564(b)(1) of the Act, 21 U.S.C. section  360bbb-3(b)(1), unless the authorization is terminated or revoked sooner. Performed at [facility], [address] [place], [place] [address]   CBC with Differential     Status: Abnormal   Collection Time: 06/29/18  2:59 PM  Result Value Ref Range   WBC 17.8 (H) 4.0 - 10.5 K/uL   RBC 3.68 (L) 3.87 - 5.11 MIL/uL   Hemoglobin 8.8 (L) 12.0 - 15.0 g/dL   HCT 29.8 (L) 36.0 - 46.0 %   MCV 81.0 80.0 - 100.0 fL   MCH 23.9 (L) 26.0 - 34.0 pg   MCHC 29.5 (L) 30.0 - 36.0 g/dL   RDW 14.6 11.5 - 15.5 %   Platelets 616 (H) 150 - 400 K/uL   nRBC 0.0 0.0 - 0.2 %   Neutrophils Relative % 87 %   Neutro Abs 15.5 (H) 1.7 - 7.7 K/uL   Lymphocytes Relative 6 %   Lymphs Abs 1.0 0.7 - 4.0 K/uL   Monocytes Relative 6 %   Monocytes Absolute 1.1 (H) 0.1 - 1.0 K/uL   Eosinophils Relative 0 %   Eosinophils Absolute 0.1 0.0 - 0.5 K/uL   Basophils Relative 0 %   Basophils Absolute 0.0 0.0 - 0.1 K/uL   Immature Granulocytes 1 %   Abs Immature Granulocytes 0.14 (H) 0.00 - 0.07 K/uL    Comment: Performed at [facility], [address] [place], [place] [address]  Comprehensive metabolic panel     Status: Abnormal   Collection Time: 06/29/18  2:59 PM  Result Value Ref Range   Sodium 128 (L) 135 - 145 mmol/L   Potassium 4.6 3.5 - 5.1 mmol/L   Chloride 94 (L) 98 - 111 mmol/L   CO2 23 22 - 32 mmol/L   Glucose, Bld 129 (H) 70 - 99 mg/dL   BUN 14 8 - 23 mg/dL   Creatinine, Ser 0.59 0.44 - 1.00 mg/dL   Calcium 8.3 (L) 8.9 - 10.3 mg/dL   Total Protein 6.5 6.5 - 8.1 g/dL   Albumin 2.3 (L) 3.5 - 5.0 g/dL   AST 12 (L) 15 - 41 U/L   ALT 10 0 - 44 U/L   Alkaline Phosphatase 105 38 - 126 U/L   Total Bilirubin 0.3 0.3 - 1.2 mg/dL   GFR calc non Af Amer >60 >60 mL/min   GFR calc Af Amer >60 >60 mL/min   Anion gap 11 5 - 15    Comment: Performed at [facility], [address] [place], [place] [address]  Brain natriuretic peptide     Status: Abnormal   Collection Time: 06/29/18  2:59 PM  Result Value Ref Range    B Natriuretic Peptide 172.0 (H) 0.0 - 100.0 pg/mL    Comment: Performed at [facility], [address], [place], [place] [address]  Troponin I (High Sensitivity)     Status: None   Collection Time: 06/29/18  2:59 PM  Result Value Ref Range   Troponin I (High Sensitivity) 3.00 <18 ng/L    Comment: (NOTE) Elevated high sensitivity troponin I (hsTnI) values and significant  changes across serial measurements may suggest ACS but many other  chronic and acute conditions are known to elevate hsTnI results.  Refer to the "Links" section for chest pain algorithms and additional  guidance. Performed at [facility], [address], [place], [place] [address]   Troponin I (  High Sensitivity)     Status: None   Collection Time: 06/29/18  5:04 PM  Result Value Ref Range   Troponin I (High Sensitivity) 3.00 <18 ng/L    Comment: (NOTE) Elevated high sensitivity troponin I (hsTnI) values and significant  changes across serial measurements may suggest ACS but many other  chronic and acute conditions are known to elevate hsTnI results.  Refer to the "Links" section for chest pain algorithms and additional  guidance. Performed at [facility], [address] [place], [place] [address]   Basic metabolic panel     Status: Abnormal   Collection Time: 06/30/18  4:47 AM  Result Value Ref Range   Sodium 132 (L) 135 - 145 mmol/L   Potassium 4.7 3.5 - 5.1 mmol/L   Chloride 97 (L) 98 - 111 mmol/L   CO2 25 22 - 32 mmol/L   Glucose, Bld 99 70 - 99 mg/dL   BUN 12 8 - 23 mg/dL   Creatinine, Ser 0.56 0.44 - 1.00 mg/dL   Calcium 8.2 (L) 8.9 - 10.3 mg/dL   GFR calc non Af Amer >60 >60 mL/min   GFR calc Af Amer >60 >60 mL/min   Anion gap 10 5 - 15    Comment: Performed at [facility], [address], [place], [place] [address]  MRSA PCR Screening     Status: None   Collection Time: 06/30/18  5:48 AM   Specimen: Nasopharyngeal  Result Value Ref Range   MRSA by PCR NEGATIVE NEGATIVE    Comment:        The  GeneXpert MRSA Assay (FDA approved for NASAL specimens only), is one component of a comprehensive MRSA colonization surveillance program. It is not intended to diagnose MRSA infection nor to guide or monitor treatment for MRSA infections. Performed at [facility], [address] [place], [place] [address]     ABGS No results for input(s): PHART, PO2ART, TCO2, HCO3 in the last 72 hours.  Invalid input(s): PCO2 CULTURES Recent Results (from the past 240 hour(s))  Culture, blood (Routine X 2) w Reflex to ID Panel     Status: None   Collection Time: 06/21/18 11:50 AM   Specimen: BLOOD RIGHT ARM  Result Value Ref Range Status   Specimen Description BLOOD RIGHT ARM DRAWN BY RN  Final   Special Requests   Final    BOTTLES DRAWN AEROBIC ONLY Blood Culture results may not be optimal due to an inadequate volume of blood received in culture bottles   Culture   Final    NO GROWTH 6 DAYS Performed at [facility], [address] [place], [place] [address]    Report Status 06/27/2018 FINAL  Final  Culture, blood (Routine X 2) w Reflex to ID Panel     Status: None   Collection Time: 06/21/18 11:50 AM   Specimen: BLOOD LEFT ARM  Result Value Ref Range Status   Specimen Description BLOOD LEFT ARM DRAWN BY RN  Final   Special Requests ANAEROBIC BOTTLE ONLY  Final   Culture   Final    NO GROWTH 6 DAYS Performed at [facility], [address] [place], [place] [address]    Report Status 06/27/2018 FINAL  Final  SARS Coronavirus 2 (CEPHEID- Performed in [place] hospital lab), Hosp Order     Status: None   Collection Time: 06/29/18  1:34 PM   Specimen: Nasopharyngeal Swab  Result Value Ref Range Status   SARS Coronavirus 2 NEGATIVE NEGATIVE Final  Comment: (NOTE) If result is NEGATIVE SARS-CoV-2 target nucleic acids are NOT DETECTED. The SARS-CoV-2 RNA is generally detectable in upper and lower  respiratory specimens during the acute phase of infection. The lowest  concentration  of SARS-CoV-2 viral copies this assay can detect is 250  copies / mL. A negative result does not preclude SARS-CoV-2 infection  and should not be used as the sole basis for treatment or other  patient management decisions.  A negative result may occur with  improper specimen collection / handling, submission of specimen other  than nasopharyngeal swab, presence of viral mutation(s) within the  areas targeted by this assay, and inadequate number of viral copies  (<250 copies / mL). A negative result must be combined with clinical  observations, patient history, and epidemiological information. If result is POSITIVE SARS-CoV-2 target nucleic acids are DETECTED. The SARS-CoV-2 RNA is generally detectable in upper and lower  respiratory specimens dur ing the acute phase of infection.  Positive  results are indicative of active infection with SARS-CoV-2.  Clinical  correlation with patient history and other diagnostic information is  necessary to determine patient infection status.  Positive results do  not rule out bacterial infection or co-infection with other viruses. If result is PRESUMPTIVE POSTIVE SARS-CoV-2 nucleic acids MAY BE PRESENT.   A presumptive positive result was obtained on the submitted specimen  and confirmed on repeat testing.  While 2019 novel coronavirus  (SARS-CoV-2) nucleic acids may be present in the submitted sample  additional confirmatory testing may be necessary for epidemiological  and / or clinical management purposes  to differentiate between  SARS-CoV-2 and other Sarbecovirus currently known to infect humans.  If clinically indicated additional testing with an alternate test  methodology [phone]) is advised. The SARS-CoV-2 RNA is generally  detectable in upper and lower respiratory sp ecimens during the acute  phase of infection. The expected result is Negative. Fact Sheet for Patients:  StrictlyIdeas.no Fact Sheet for Healthcare  Providers: BankingDealers.co.za This test is not yet approved or cleared by the [place] FDA and has been authorized for detection and/or diagnosis of SARS-CoV-2 by FDA under an Emergency Use Authorization (EUA).  This EUA will remain in effect (meaning this test can be used) for the duration of the COVID-19 declaration under Section 564(b)(1) of the Act, 21 U.S.C. section 360bbb-3(b)(1), unless the authorization is terminated or revoked sooner. Performed at [facility], [address], [place], [place] [address]   MRSA PCR Screening     Status: None   Collection Time: 06/30/18  5:48 AM   Specimen: Nasopharyngeal  Result Value Ref Range Status   MRSA by PCR NEGATIVE NEGATIVE Final    Comment:        The GeneXpert MRSA Assay (FDA approved for NASAL specimens only), is one component of a comprehensive MRSA colonization surveillance program. It is not intended to diagnose MRSA infection nor to guide or monitor treatment for MRSA infections. Performed at [facility], [address] [place], [place] [address]    Studies/Results: Ct Angio Chest Pe W Or Wo Contrast  Result Date: 06/29/2018 CLINICAL DATA:  Shortness of breath and chest pain EXAM: CT ANGIOGRAPHY CHEST WITH CONTRAST TECHNIQUE: Multidetector CT imaging of the chest was performed using the standard protocol during bolus administration of intravenous contrast. Multiplanar CT image reconstructions and MIPs were obtained to evaluate the vascular anatomy. CONTRAST:  169mL OMNIPAQUE IOHEXOL 350 MG/ML SOLN COMPARISON:  Radiograph 06/29/2018, CT 02/17/2018 FINDINGS: Cardiovascular: Satisfactory opacification of the pulmonary  arteries to the segmental level. No evidence of pulmonary embolism. Nonaneurysmal aorta. No dissection is seen. Mild to moderate aortic atherosclerosis. Tortuous aorta. Coronary vascular calcifications. Heart size upper normal. No pericardial effusion. Mediastinum/Nodes: Midline trachea.  No suspicious thyroid mass. No significant adenopathy. Thickened appearance of the distal esophagus. Large hiatal hernia containing most of the stomach, mesenteric fat, and some of the colon. This is increased compared to prior CT. Lungs/Pleura: Small bilateral pleural effusions which are new. Partial atelectasis within the lower lobes. Mild subpleural fibrosis within the right middle lobe lingula and lower lobes. Peribronchial ground-glass density in the left upper lobe and superior segment of left lower lobe with additional scattered foci of ground-glass density in the right upper lobe and superior segment right lower lobe. Increased septal thickening, suggesting mild underlying edema. Upper Abdomen: No acute abnormality. Musculoskeletal: Kyphosis of the spine. Multiple chronic compression fractures T6 through T9 and at T11 with increased loss of height of T11 vertebral body since 02/17/2018 CT. Review of the MIP images confirms the above findings. IMPRESSION: 1. Negative for acute pulmonary embolus or aortic dissection. 2. Small bilateral pleural effusions with partial atelectasis at the lower lobes. Multifocal ground-glass densities within the left perihilar region, upper lobes and superior segments of the lower lobes, possible multifocal pneumonia. Diffuse bilateral septal thickening, suggesting underlying interstitial edema. 3. Large hiatal hernia containing stomach, mesentery and colon. This appears slightly increased compared to prior CT Aortic aneurysm NOS (ICD10-I71.9). Electronically Signed   By: Donavan Foil M.D.   On: 06/29/2018 20:45   Dg Chest Portable 1 View  Result Date: 06/29/2018 CLINICAL DATA:  Chest pain and shortness of breath. EXAM: PORTABLE CHEST 1 VIEW COMPARISON:  06/20/2018 FINDINGS: The heart is mildly enlarged but stable. Fairly significant central vascular congestion and interstitial pulmonary edema with bilateral pleural effusions and bibasilar atelectasis. Large hiatal hernia.  IMPRESSION: Findings consistent with CHF. Electronically Signed   By: Marijo Sanes M.D.   On: 06/29/2018 14:17    Medications:  Prior to Admission:  Medications Prior to Admission  Medication Sig Dispense Refill Last Dose  . albuterol (PROAIR HFA) 108 (90 Base) MCG/ACT inhaler Inhale 2 puffs into the lungs every 4 (four) hours as needed. 8 g 0 06/29/2018 at Unknown time  . albuterol (PROVENTIL) (2.5 MG/3ML) 0.083% nebulizer solution Take 3 mLs (2.5 mg total) by nebulization every 6 (six) hours as needed for wheezing or shortness of breath. 75 mL 0 06/29/2018 at Unknown time  . ALPRAZolam (XANAX) 0.5 MG tablet Take 1 tablet (0.5 mg total) by mouth 3 (three) times daily. 45 tablet 0 06/28/2018 at Unknown time  . amoxicillin-clavulanate (AUGMENTIN) 875-125 MG tablet Take 1 tablet by mouth 2 (two) times daily for 6 days. X 10 days 12 tablet 0 06/29/2018 at Unknown time  . cetirizine (ZYRTEC) 10 MG tablet Take 10 mg by mouth daily as needed for allergies.   06/29/2018 at Unknown time  . furosemide (LASIX) 40 MG tablet Take 1 tablet (40 mg total) by mouth daily. Beginning 06/26/18 30 tablet 0 06/29/2018 at Unknown time  . linaclotide (LINZESS) 145 MCG CAPS capsule Take 1 capsule (145 mcg total) by mouth daily as needed. 30 capsule 0 06/28/2018 at Unknown time  . mupirocin ointment (BACTROBAN) 2 % Apply 1 application topically 2 (two) times daily.    06/28/2018 at Unknown time  . potassium chloride (K-DUR) 10 MEQ tablet Take 1 tablet (10 mEq total) by mouth daily. 30 tablet 0 06/28/2018 at Unknown time  .  Probiotic Product (RISA-BID PROBIOTIC) TABS Take 1 tablet by mouth 2 (two) times a day.   06/28/2018 at Unknown time  . tamsulosin (FLOMAX) 0.4 MG CAPS capsule Take 1 capsule (0.4 mg total) by mouth daily. 30 capsule 0 06/28/2018 at Unknown time  . [EXPIRED] traMADol (ULTRAM) 50 MG tablet Take 2 tablets (100 mg total) by mouth every 4 (four) hours as needed for up to 6 days. For 8-10/10 pain 14 tablet 0 06/29/2018  at Unknown time  . zolpidem (AMBIEN) 5 MG tablet Take 1 tablet (5 mg total) by mouth at bedtime as needed for up to 13 days for sleep. X 14 days 5 tablet 0 06/28/2018 at Unknown time  . Amino Acids-Protein Hydrolys (FEEDING SUPPLEMENT, PRO-STAT SUGAR FREE 64,) LIQD Take 30 mLs by mouth 3 (three) times daily with meals.   Not Taking at Unknown time  . furosemide (LASIX) 40 MG tablet Take 40 mg by mouth 2 (two) times daily.      . Memantine HCl ER (NAMENDA XR TITRATION PACK) 7 & 14 & 21 &28 MG CP24 Take 6-28 mg by mouth daily. 6/19 - 06/30/2018 = 7 mg daily 6/27 - 07/08/2018 = 14 mg daily 7/5 - 07/16/2018 - 21 mg 07/17/19 - Begin 28 mg daily (Patient not taking: Reported on 06/29/2018) 30 capsule 0 Not Taking at Unknown time  . NON FORMULARY Diet Type:  Mechanical soft,regular diet   Not Taking at Unknown time  . pantoprazole (PROTONIX) 40 MG tablet Take 1 tablet (40 mg total) by mouth 2 (two) times daily before a meal. (Patient not taking: Reported on 06/29/2018) 60 tablet 0 Not Taking at Unknown time  . umeclidinium-vilanterol (ANORO ELLIPTA) 62.5-25 MCG/INH AEPB Inhale 1 puff into the lungs daily. (Patient not taking: Reported on 06/29/2018) 60 each 0 Not Taking at Unknown time  . UNABLE TO FIND Silver Alginate - Clean wound left medial heel with N / S, apply sliver alginate and allevyn foam dressing,  Change daily and as needed   Not Taking at Unknown time  . Wound Dressings (ALLEVYN ADHESIVE EX) Allevyn Foam Dressing:   Apply to kyphotic bony prominence of spine and to lesion area right upper back and change every 3 days and as needed      Scheduled: . enoxaparin (LOVENOX) injection  40 mg Subcutaneous Q24H  . furosemide  20 mg Intravenous Q12H  . mupirocin ointment   Topical Daily  . potassium chloride  10 mEq Oral Daily   Continuous: . ceFEPime (MAXIPIME) IV 2 g (07/01/18 3785)   YIF:OYDXAJOINOMVE **OR** acetaminophen, albuterol, ALPRAZolam, linaclotide, ondansetron **OR** ondansetron (ZOFRAN) IV,  polyethylene glycol, zolpidem  Assesment: She was admitted with a combination of heart failure and pneumonia.  She has acute on chronic hypoxic respiratory failure.  She is severely deconditioned.  She has had multiple hospitalizations in the last several months.  She is much better now.  Intake and output -700 Active Problems:   Acute CHF (congestive heart failure) (HCC)   Pneumonia    Plan: Continue antibiotics.  Discontinue vancomycin.  Continue diuresis no other changes recheck labs in a.m.    LOS: 1 day   Alonza Bogus 07/01/2018, 9:26 AM

## 2018-07-02 LAB — CBC WITH DIFFERENTIAL/PLATELET
Abs Immature Granulocytes: 0.05 10*3/uL (ref 0.00–0.07)
Basophils Absolute: 0 10*3/uL (ref 0.0–0.1)
Basophils Relative: 0 %
Eosinophils Absolute: 0.5 10*3/uL (ref 0.0–0.5)
Eosinophils Relative: 5 %
HCT: 28.9 % — ABNORMAL LOW (ref 36.0–46.0)
Hemoglobin: 8.5 g/dL — ABNORMAL LOW (ref 12.0–15.0)
Immature Granulocytes: 1 %
Lymphocytes Relative: 12 %
Lymphs Abs: 1.2 10*3/uL (ref 0.7–4.0)
MCH: 23.6 pg — ABNORMAL LOW (ref 26.0–34.0)
MCHC: 29.4 g/dL — ABNORMAL LOW (ref 30.0–36.0)
MCV: 80.3 fL (ref 80.0–100.0)
Monocytes Absolute: 0.9 10*3/uL (ref 0.1–1.0)
Monocytes Relative: 9 %
Neutro Abs: 7.2 10*3/uL (ref 1.7–7.7)
Neutrophils Relative %: 73 %
Platelets: 602 10*3/uL — ABNORMAL HIGH (ref 150–400)
RBC: 3.6 MIL/uL — ABNORMAL LOW (ref 3.87–5.11)
RDW: 14.6 % (ref 11.5–15.5)
WBC: 9.8 10*3/uL (ref 4.0–10.5)
nRBC: 0 % (ref 0.0–0.2)

## 2018-07-02 LAB — COMPREHENSIVE METABOLIC PANEL
ALT: 10 U/L (ref 0–44)
AST: 13 U/L — ABNORMAL LOW (ref 15–41)
Albumin: 2.1 g/dL — ABNORMAL LOW (ref 3.5–5.0)
Alkaline Phosphatase: 99 U/L (ref 38–126)
Anion gap: 8 (ref 5–15)
BUN: 19 mg/dL (ref 8–23)
CO2: 28 mmol/L (ref 22–32)
Calcium: 8.2 mg/dL — ABNORMAL LOW (ref 8.9–10.3)
Chloride: 96 mmol/L — ABNORMAL LOW (ref 98–111)
Creatinine, Ser: 0.73 mg/dL (ref 0.44–1.00)
GFR calc Af Amer: >60 mL/min (ref >60)
GFR calc non Af Amer: >60 mL/min (ref >60)
Glucose, Bld: 100 mg/dL — ABNORMAL HIGH (ref 70–99)
Potassium: 3.8 mmol/L (ref 3.5–5.1)
Sodium: 132 mmol/L — ABNORMAL LOW (ref 135–145)
Total Bilirubin: 0.3 mg/dL (ref 0.3–1.2)
Total Protein: 6.2 g/dL — ABNORMAL LOW (ref 6.5–8.1)

## 2018-07-02 MED ORDER — TRAMADOL HCL 50 MG PO TABS
50.0000 mg | ORAL_TABLET | Freq: Four times a day (QID) | ORAL | Status: DC | PRN
  Administered 2018-07-02 – 2018-07-04 (×4): 50 mg via ORAL
  Filled 2018-07-02 (×4): qty 1

## 2018-07-02 NOTE — Progress Notes (Signed)
Pharmacy Antibiotic Note  [name] is a 84 y.o. female admitted on 06/29/2018 with pneumonia.  Pharmacy has been consulted for Cefepime dosing. Patient improving, feels better. Afebrile, WBC improved  Plan: Continue Cefepime 2000 mg IV every 12 hours. Monitor labs, c/s F/U LOT  Height: 5\' 1"  (154.9 cm) Weight: 115 lb 11.9 oz (52.5 kg) IBW/kg (Calculated) : 47.8  Temp (24hrs), Avg:97.8 F (36.6 C), Min:97.5 F (36.4 C), Max:98.2 F (36.8 C)  Recent Labs  Lab 06/29/18 1459 06/30/18 0447 07/02/18 0637  WBC 17.8*  --  9.8  CREATININE 0.59 0.56 0.73    Estimated Creatinine Clearance: 41.6 mL/min (by C-G formula based on SCr of 0.73 mg/dL).    Allergies  Allergen Reactions  . Codeine Shortness Of Breath and Rash  . Codeine Anaphylaxis  . Levofloxacin Anaphylaxis    Patient was admitted to hospital with lung problems due to this medication  . Sulfonamide Derivatives Hives and Shortness Of Breath  . Sulfa Antibiotics Hives    Antimicrobials this admission: Vanco 6/25 >> 6/27 Cefepime 6/25 >>   Microbiology results: 6/26 MRSA PCR: neg   Thank you for allowing pharmacy to be a part of this patient's care.  Isac Sarna, BS Pharm D, [place] Clinical Pharmacist Pager [phone] 07/02/2018 10:05 AM

## 2018-07-02 NOTE — Progress Notes (Signed)
Subjective: She says she feels better.  She is less confused.  Still very hard of hearing.  She says her breathing is okay but she feels like she needs the oxygen.  Objective: Vital signs in last 24 hours: Temp:  [97.5 F (36.4 C)-98.2 F (36.8 C)] 97.5 F (36.4 C) (06/28 0532) Pulse Rate:  [79-82] 82 (06/28 0532) Resp:  [16-18] 18 (06/28 0532) BP: (94-101)/(58-64) 101/58 (06/28 0532) SpO2:  [94 %-100 %] 94 % (06/28 0819) Weight:  [52.5 kg] 52.5 kg (06/28 0532) Weight change: -3.2 kg Last BM Date: 07/01/18  Intake/Output from previous day: 06/27 0701 - 06/28 0700 In: 1553 [P.O.:1440; IV Piggyback:113] Out: 2400 [Urine:2400]  PHYSICAL EXAM General appearance: alert, cooperative and Very hard of hearing Resp: clear to auscultation bilaterally Cardio: regular rate and rhythm, S1, S2 normal, no murmur, click, rub or gallop GI: soft, non-tender; bowel sounds normal; no masses,  no organomegaly Extremities: Trace edema  Lab Results:  Results for orders placed or performed during the hospital encounter of 06/29/18 (from the past 48 hour(s))  CBC with Differential/Platelet     Status: Abnormal   Collection Time: 07/02/18  6:37 AM  Result Value Ref Range   WBC 9.8 4.0 - 10.5 K/uL   RBC 3.60 (L) 3.87 - 5.11 MIL/uL   Hemoglobin 8.5 (L) 12.0 - 15.0 g/dL   HCT 28.9 (L) 36.0 - 46.0 %   MCV 80.3 80.0 - 100.0 fL   MCH 23.6 (L) 26.0 - 34.0 pg   MCHC 29.4 (L) 30.0 - 36.0 g/dL   RDW 14.6 11.5 - 15.5 %   Platelets 602 (H) 150 - 400 K/uL   nRBC 0.0 0.0 - 0.2 %   Neutrophils Relative % 73 %   Neutro Abs 7.2 1.7 - 7.7 K/uL   Lymphocytes Relative 12 %   Lymphs Abs 1.2 0.7 - 4.0 K/uL   Monocytes Relative 9 %   Monocytes Absolute 0.9 0.1 - 1.0 K/uL   Eosinophils Relative 5 %   Eosinophils Absolute 0.5 0.0 - 0.5 K/uL   Basophils Relative 0 %   Basophils Absolute 0.0 0.0 - 0.1 K/uL   Immature Granulocytes 1 %   Abs Immature Granulocytes 0.05 0.00 - 0.07 K/uL    Comment: Performed at  [facility], [address], [place], [place] [address]  Comprehensive metabolic panel     Status: Abnormal   Collection Time: 07/02/18  6:37 AM  Result Value Ref Range   Sodium 132 (L) 135 - 145 mmol/L   Potassium 3.8 3.5 - 5.1 mmol/L   Chloride 96 (L) 98 - 111 mmol/L   CO2 28 22 - 32 mmol/L   Glucose, Bld 100 (H) 70 - 99 mg/dL   BUN 19 8 - 23 mg/dL   Creatinine, Ser 0.73 0.44 - 1.00 mg/dL   Calcium 8.2 (L) 8.9 - 10.3 mg/dL   Total Protein 6.2 (L) 6.5 - 8.1 g/dL   Albumin 2.1 (L) 3.5 - 5.0 g/dL   AST 13 (L) 15 - 41 U/L   ALT 10 0 - 44 U/L   Alkaline Phosphatase 99 38 - 126 U/L   Total Bilirubin 0.3 0.3 - 1.2 mg/dL   GFR calc non Af Amer >60 >60 mL/min   GFR calc Af Amer >60 >60 mL/min   Anion gap 8 5 - 15    Comment: Performed at [facility], [address], [place], [place] [address]    ABGS No results for input(s): PHART, PO2ART, TCO2, HCO3  in the last 72 hours.  Invalid input(s): PCO2 CULTURES Recent Results (from the past 240 hour(s))  SARS Coronavirus 2 (CEPHEID- Performed in [facility] hospital lab), Hosp Order     Status: None   Collection Time: 06/29/18  1:34 PM   Specimen: Nasopharyngeal Swab  Result Value Ref Range Status   SARS Coronavirus 2 NEGATIVE NEGATIVE Final    Comment: (NOTE) If result is NEGATIVE SARS-CoV-2 target nucleic acids are NOT DETECTED. The SARS-CoV-2 RNA is generally detectable in upper and lower  respiratory specimens during the acute phase of infection. The lowest  concentration of SARS-CoV-2 viral copies this assay can detect is 250  copies / mL. A negative result does not preclude SARS-CoV-2 infection  and should not be used as the sole basis for treatment or other  patient management decisions.  A negative result may occur with  improper specimen collection / handling, submission of specimen other  than nasopharyngeal swab, presence of viral mutation(s) within the  areas targeted by this assay, and inadequate number of viral copies   (<250 copies / mL). A negative result must be combined with clinical  observations, patient history, and epidemiological information. If result is POSITIVE SARS-CoV-2 target nucleic acids are DETECTED. The SARS-CoV-2 RNA is generally detectable in upper and lower  respiratory specimens dur ing the acute phase of infection.  Positive  results are indicative of active infection with SARS-CoV-2.  Clinical  correlation with patient history and other diagnostic information is  necessary to determine patient infection status.  Positive results do  not rule out bacterial infection or co-infection with other viruses. If result is PRESUMPTIVE POSTIVE SARS-CoV-2 nucleic acids MAY BE PRESENT.   A presumptive positive result was obtained on the submitted specimen  and confirmed on repeat testing.  While 2019 novel coronavirus  (SARS-CoV-2) nucleic acids may be present in the submitted sample  additional confirmatory testing may be necessary for epidemiological  and / or clinical management purposes  to differentiate between  SARS-CoV-2 and other Sarbecovirus currently known to infect humans.  If clinically indicated additional testing with an alternate test  methodology [phone]) is advised. The SARS-CoV-2 RNA is generally  detectable in upper and lower respiratory sp ecimens during the acute  phase of infection. The expected result is Negative. Fact Sheet for Patients:  StrictlyIdeas.no Fact Sheet for Healthcare Providers: BankingDealers.co.za This test is not yet approved or cleared by the [place] FDA and has been authorized for detection and/or diagnosis of SARS-CoV-2 by FDA under an Emergency Use Authorization (EUA).  This EUA will remain in effect (meaning this test can be used) for the duration of the COVID-19 declaration under Section 564(b)(1) of the Act, 21 U.S.C. section 360bbb-3(b)(1), unless the authorization is terminated  or revoked sooner. Performed at [facility], [address], [place], [place] [address]   MRSA PCR Screening     Status: None   Collection Time: 06/30/18  5:48 AM   Specimen: Nasopharyngeal  Result Value Ref Range Status   MRSA by PCR NEGATIVE NEGATIVE Final    Comment:        The GeneXpert MRSA Assay (FDA approved for NASAL specimens only), is one component of a comprehensive MRSA colonization surveillance program. It is not intended to diagnose MRSA infection nor to guide or monitor treatment for MRSA infections. Performed at [facility], [address], [place], [place] [address]    Studies/Results: No results found.  Medications:  Prior to Admission:  Medications Prior to Admission  Medication Sig Dispense Refill Last Dose  . albuterol (PROAIR HFA) 108 (90 Base) MCG/ACT inhaler Inhale 2 puffs into the lungs every 4 (four) hours as needed. 8 g 0 06/29/2018 at Unknown time  . albuterol (PROVENTIL) (2.5 MG/3ML) 0.083% nebulizer solution Take 3 mLs (2.5 mg total) by nebulization every 6 (six) hours as needed for wheezing or shortness of breath. 75 mL 0 06/29/2018 at Unknown time  . ALPRAZolam (XANAX) 0.5 MG tablet Take 1 tablet (0.5 mg total) by mouth 3 (three) times daily. 45 tablet 0 06/28/2018 at Unknown time  . [EXPIRED] amoxicillin-clavulanate (AUGMENTIN) 875-125 MG tablet Take 1 tablet by mouth 2 (two) times daily for 6 days. X 10 days 12 tablet 0 06/29/2018 at Unknown time  . cetirizine (ZYRTEC) 10 MG tablet Take 10 mg by mouth daily as needed for allergies.   06/29/2018 at Unknown time  . furosemide (LASIX) 40 MG tablet Take 1 tablet (40 mg total) by mouth daily. Beginning 06/26/18 30 tablet 0 06/29/2018 at Unknown time  . linaclotide (LINZESS) 145 MCG CAPS capsule Take 1 capsule (145 mcg total) by mouth daily as needed. 30 capsule 0 06/28/2018 at Unknown time  . mupirocin ointment (BACTROBAN) 2 % Apply 1 application topically 2 (two) times daily.    06/28/2018 at Unknown time  .  potassium chloride (K-DUR) 10 MEQ tablet Take 1 tablet (10 mEq total) by mouth daily. 30 tablet 0 06/28/2018 at Unknown time  . Probiotic Product (RISA-BID PROBIOTIC) TABS Take 1 tablet by mouth 2 (two) times a day.   06/28/2018 at Unknown time  . tamsulosin (FLOMAX) 0.4 MG CAPS capsule Take 1 capsule (0.4 mg total) by mouth daily. 30 capsule 0 06/28/2018 at Unknown time  . [EXPIRED] traMADol (ULTRAM) 50 MG tablet Take 2 tablets (100 mg total) by mouth every 4 (four) hours as needed for up to 6 days. For 8-10/10 pain 14 tablet 0 06/29/2018 at Unknown time  . zolpidem (AMBIEN) 5 MG tablet Take 1 tablet (5 mg total) by mouth at bedtime as needed for up to 13 days for sleep. X 14 days 5 tablet 0 06/28/2018 at Unknown time  . Amino Acids-Protein Hydrolys (FEEDING SUPPLEMENT, PRO-STAT SUGAR FREE 64,) LIQD Take 30 mLs by mouth 3 (three) times daily with meals.   Not Taking at Unknown time  . furosemide (LASIX) 40 MG tablet Take 40 mg by mouth 2 (two) times daily.      . Memantine HCl ER (NAMENDA XR TITRATION PACK) 7 & 14 & 21 &28 MG CP24 Take 6-28 mg by mouth daily. 6/19 - 06/30/2018 = 7 mg daily 6/27 - 07/08/2018 = 14 mg daily 7/5 - 07/16/2018 - 21 mg 07/17/19 - Begin 28 mg daily (Patient not taking: Reported on 06/29/2018) 30 capsule 0 Not Taking at Unknown time  . NON FORMULARY Diet Type:  Mechanical soft,regular diet   Not Taking at Unknown time  . pantoprazole (PROTONIX) 40 MG tablet Take 1 tablet (40 mg total) by mouth 2 (two) times daily before a meal. (Patient not taking: Reported on 06/29/2018) 60 tablet 0 Not Taking at Unknown time  . umeclidinium-vilanterol (ANORO ELLIPTA) 62.5-25 MCG/INH AEPB Inhale 1 puff into the lungs daily. (Patient not taking: Reported on 06/29/2018) 60 each 0 Not Taking at Unknown time  . UNABLE TO FIND Silver Alginate - Clean wound left medial heel with N / S, apply sliver alginate and allevyn foam dressing,  Change daily and as needed   Not Taking at Unknown time  .  Wound Dressings  (ALLEVYN ADHESIVE EX) Allevyn Foam Dressing:   Apply to kyphotic bony prominence of spine and to lesion area right upper back and change every 3 days and as needed      Scheduled: . enoxaparin (LOVENOX) injection  40 mg Subcutaneous Q24H  . furosemide  20 mg Intravenous Q12H  . mupirocin ointment   Topical Daily  . potassium chloride  10 mEq Oral Daily   Continuous: . ceFEPime (MAXIPIME) IV 2 g (07/02/18 0800)   CHE:NIDPOEUMPNTIR **OR** acetaminophen, albuterol, ALPRAZolam, linaclotide, ondansetron **OR** ondansetron (ZOFRAN) IV, polyethylene glycol, zolpidem  Assesment: She was admitted with acute congestive heart failure and pneumonia.  She is improving.  She has had trouble with urinary retention but she is doing okay with that now.  She has had trouble with confusion but that seems much better.  She had a recent hip fracture and went to a skilled care facility but she was too confused to participate.  I think she might be able to participate now and it has been suggested that she go to a skilled care facility by physical therapy. Active Problems:   Acute CHF (congestive heart failure) ([place])   Pneumonia    Plan: Continue treatments.  Continue diuresis.  Potential discharge to skilled care facility tomorrow    LOS: 2 days   Alonza Bogus 07/02/2018, 8:29 AM

## 2018-07-03 LAB — CREATININE, SERUM
Creatinine, Ser: 0.67 mg/dL (ref 0.44–1.00)
GFR calc Af Amer: >60 mL/min (ref >60)
GFR calc non Af Amer: >60 mL/min (ref >60)

## 2018-07-03 MED ORDER — TORSEMIDE 20 MG PO TABS
20.0000 mg | ORAL_TABLET | Freq: Two times a day (BID) | ORAL | Status: DC
  Administered 2018-07-04: 20 mg via ORAL
  Filled 2018-07-03 (×2): qty 1

## 2018-07-03 NOTE — TOC Progression Note (Signed)
Transition of Care [facility]) - Progression Note    Patient Details  Name: LETHA MIRABAL MRN: [id number] Date of Birth: [birth year]  Transition [facility]) CM/SW [place], LCSW Phone Number: 07/03/2018, 5:13 PM  Clinical Narrative:   CSW in contact with [facility] to discuss Bed offer. This writter was notified by person on the phone that there are currently no female beds available until possibly Friday.   CSW in contact with [name] to discuss future placement options. CSW verbally provided Rebecka Apley, [name], with Medicare Choice list of SNF in surrounding areas. Family stated that their top 3 options for placement were [facility], [facility], and [facility].  CSW will send out referral to the above listed facilities. CSW will continue to follow pt for any discharge needs.   H&P and FL2 uploaded onto [facility] website     Expected Discharge Plan: [place] Barriers to Discharge: No SNF bed, Awaiting State Approval (PASRR)  Expected Discharge Plan and Services Expected Discharge Plan: [place] In-house Referral: Clinical Social Work   Post Acute Care Choice: [place] Living arrangements for the past 2 months: Single Family Home                                       Social Determinants of Health (SDOH) Interventions    Readmission Risk Interventions Readmission Risk Prevention Plan 07/03/2018 06/30/2018 04/15/2018  Transportation Screening - Complete Complete  PCP or Specialist Appt within 5-7 Days - - Complete  Home Care Screening - - Complete  Medication Review (RN CM) - - Complete  Medication Review (RN Care Manager) Complete - -  PCP or Specialist appointment within 3-5 days of discharge Not Complete - -  PCP/Specialist Appt Not Complete comments Pt going to SNF rehab. SNF MD will follow. - -  HRI or Home Care Consult Not Complete Complete -  HRI or Home Care Consult Pt  Refusal Comments Pt going to SNF rehab - -  SW Recovery Care/Counseling Consult - Complete -  Palliative Care Screening Not Applicable - -  Skilled Nursing Facility Complete - -  Some recent data might be hidden

## 2018-07-03 NOTE — TOC Progression Note (Signed)
Transition of Care [facility]) - Progression Note    Patient Details  Name: DESERAI CANSLER MRN: [id number] Date of Birth: [birth year]  [facility]) CM/SW Contact  Shade Flood, LCSW Phone Number: 07/03/2018, 12:16 PM  Clinical Narrative:     TOC following. Per MD, pt is now agreeable to SNF rehab at dc. Spoke with pt to discuss. Per pt, she would like referral to [facility]. Updated pt's daughter at pt request. Per MD, pt will likely be stable for dc tomorrow. TOC will follow.  Expected Discharge Plan: [place] Barriers to Discharge: Continued Medical Work up  Expected Discharge Plan and Services Expected Discharge Plan: [place] In-house Referral: Clinical Social Work     Living arrangements for the past 2 months: Single Family Home                                       Social Determinants of Health (SDOH) Interventions    Readmission Risk Interventions Readmission Risk Prevention Plan 07/03/2018 06/30/2018 04/15/2018  Transportation Screening - Complete Complete  PCP or Specialist Appt within 5-7 Days - - Complete  Home Care Screening - - Complete  Medication Review (RN CM) - - Complete  Medication Review (RN Care Manager) Complete - -  PCP or Specialist appointment within 3-5 days of discharge Not Complete - -  PCP/Specialist Appt Not Complete comments Pt going to SNF rehab. SNF MD will follow. - -  HRI or Home Care Consult Not Complete Complete -  HRI or Home Care Consult Pt Refusal Comments Pt going to SNF rehab - -  SW Recovery Care/Counseling Consult - Complete -  Palliative Care Screening Not Applicable - -  Skilled Nursing Facility Complete - -  Some recent data might be hidden

## 2018-07-03 NOTE — Progress Notes (Signed)
Subjective: She says she feels okay.  She reluctantly agrees to rehab stay.  She does not want to go back to [facility] and would prefer [facility] in [place].  She says her breathing is better.  She is still using oxygen.  Objective: Vital signs in last 24 hours: Temp:  [97.1 F (36.2 C)-98.2 F (36.8 C)] 98.2 F (36.8 C) (06/29 0530) Pulse Rate:  [73-81] 81 (06/29 0530) Resp:  [16-20] 20 (06/29 0530) BP: (87-97)/(58-63) 94/58 (06/29 0530) SpO2:  [95 %-100 %] 100 % (06/29 0530) Weight:  [53.1 kg] 53.1 kg (06/29 0530) Weight change: 0.6 kg Last BM Date: 07/02/18  Intake/Output from previous day: 06/28 0701 - 06/29 0700 In: 1180 [P.O.:1080; IV Piggyback:100] Out: 1800 [Urine:1800]  PHYSICAL EXAM General appearance: alert, cooperative and Very hard of hearing Resp: rhonchi bilaterally Cardio: regular rate and rhythm, S1, S2 normal, no murmur, click, rub or gallop GI: soft, non-tender; bowel sounds normal; no masses,  no organomegaly Extremities: Trace edema  Lab Results:  Results for orders placed or performed during the hospital encounter of 06/29/18 (from the past 48 hour(s))  CBC with Differential/Platelet     Status: Abnormal   Collection Time: 07/02/18  6:37 AM  Result Value Ref Range   WBC 9.8 4.0 - 10.5 K/uL   RBC 3.60 (L) 3.87 - 5.11 MIL/uL   Hemoglobin 8.5 (L) 12.0 - 15.0 g/dL   HCT 28.9 (L) 36.0 - 46.0 %   MCV 80.3 80.0 - 100.0 fL   MCH 23.6 (L) 26.0 - 34.0 pg   MCHC 29.4 (L) 30.0 - 36.0 g/dL   RDW 14.6 11.5 - 15.5 %   Platelets 602 (H) 150 - 400 K/uL   nRBC 0.0 0.0 - 0.2 %   Neutrophils Relative % 73 %   Neutro Abs 7.2 1.7 - 7.7 K/uL   Lymphocytes Relative 12 %   Lymphs Abs 1.2 0.7 - 4.0 K/uL   Monocytes Relative 9 %   Monocytes Absolute 0.9 0.1 - 1.0 K/uL   Eosinophils Relative 5 %   Eosinophils Absolute 0.5 0.0 - 0.5 K/uL   Basophils Relative 0 %   Basophils Absolute 0.0 0.0 - 0.1 K/uL   Immature Granulocytes 1 %   Abs Immature Granulocytes  0.05 0.00 - 0.07 K/uL    Comment: Performed at [facility], [address], [place], [place] [address]  Comprehensive metabolic panel     Status: Abnormal   Collection Time: 07/02/18  6:37 AM  Result Value Ref Range   Sodium 132 (L) 135 - 145 mmol/L   Potassium 3.8 3.5 - 5.1 mmol/L   Chloride 96 (L) 98 - 111 mmol/L   CO2 28 22 - 32 mmol/L   Glucose, Bld 100 (H) 70 - 99 mg/dL   BUN 19 8 - 23 mg/dL   Creatinine, Ser 0.73 0.44 - 1.00 mg/dL   Calcium 8.2 (L) 8.9 - 10.3 mg/dL   Total Protein 6.2 (L) 6.5 - 8.1 g/dL   Albumin 2.1 (L) 3.5 - 5.0 g/dL   AST 13 (L) 15 - 41 U/L   ALT 10 0 - 44 U/L   Alkaline Phosphatase 99 38 - 126 U/L   Total Bilirubin 0.3 0.3 - 1.2 mg/dL   GFR calc non Af Amer >60 >60 mL/min   GFR calc Af Amer >60 >60 mL/min   Anion gap 8 5 - 15    Comment: Performed at [facility], [address], [place], [place] [address]  Creatinine,  serum     Status: None   Collection Time: 07/03/18  4:57 AM  Result Value Ref Range   Creatinine, Ser 0.67 0.44 - 1.00 mg/dL   GFR calc non Af Amer >60 >60 mL/min   GFR calc Af Amer >60 >60 mL/min    Comment: Performed at [facility], [address], [place], [place] [address]    ABGS No results for input(s): PHART, PO2ART, TCO2, HCO3 in the last 72 hours.  Invalid input(s): PCO2 CULTURES Recent Results (from the past 240 hour(s))  SARS Coronavirus 2 (CEPHEID- Performed in [facility] hospital lab), Hosp Order     Status: None   Collection Time: 06/29/18  1:34 PM   Specimen: Nasopharyngeal Swab  Result Value Ref Range Status   SARS Coronavirus 2 NEGATIVE NEGATIVE Final    Comment: (NOTE) If result is NEGATIVE SARS-CoV-2 target nucleic acids are NOT DETECTED. The SARS-CoV-2 RNA is generally detectable in upper and lower  respiratory specimens during the acute phase of infection. The lowest  concentration of SARS-CoV-2 viral copies this assay can detect is 250  copies / mL. A negative result does not preclude SARS-CoV-2 infection   and should not be used as the sole basis for treatment or other  patient management decisions.  A negative result may occur with  improper specimen collection / handling, submission of specimen other  than nasopharyngeal swab, presence of viral mutation(s) within the  areas targeted by this assay, and inadequate number of viral copies  (<250 copies / mL). A negative result must be combined with clinical  observations, patient history, and epidemiological information. If result is POSITIVE SARS-CoV-2 target nucleic acids are DETECTED. The SARS-CoV-2 RNA is generally detectable in upper and lower  respiratory specimens dur ing the acute phase of infection.  Positive  results are indicative of active infection with SARS-CoV-2.  Clinical  correlation with patient history and other diagnostic information is  necessary to determine patient infection status.  Positive results do  not rule out bacterial infection or co-infection with other viruses. If result is PRESUMPTIVE POSTIVE SARS-CoV-2 nucleic acids MAY BE PRESENT.   A presumptive positive result was obtained on the submitted specimen  and confirmed on repeat testing.  While 2019 novel coronavirus  (SARS-CoV-2) nucleic acids may be present in the submitted sample  additional confirmatory testing may be necessary for epidemiological  and / or clinical management purposes  to differentiate between  SARS-CoV-2 and other Sarbecovirus currently known to infect humans.  If clinically indicated additional testing with an alternate test  methodology [phone]) is advised. The SARS-CoV-2 RNA is generally  detectable in upper and lower respiratory sp ecimens during the acute  phase of infection. The expected result is Negative. Fact Sheet for Patients:  StrictlyIdeas.no Fact Sheet for Healthcare Providers: BankingDealers.co.za This test is not yet approved or cleared by the [place] FDA  and has been authorized for detection and/or diagnosis of SARS-CoV-2 by FDA under an Emergency Use Authorization (EUA).  This EUA will remain in effect (meaning this test can be used) for the duration of the COVID-19 declaration under Section 564(b)(1) of the Act, 21 U.S.C. section 360bbb-3(b)(1), unless the authorization is terminated or revoked sooner. Performed at [facility], [address], [place], [place] [address]   MRSA PCR Screening     Status: None   Collection Time: 06/30/18  5:48 AM   Specimen: Nasopharyngeal  Result Value Ref Range Status   MRSA by PCR NEGATIVE NEGATIVE Final  Comment:        The GeneXpert MRSA Assay (FDA approved for NASAL specimens only), is one component of a comprehensive MRSA colonization surveillance program. It is not intended to diagnose MRSA infection nor to guide or monitor treatment for MRSA infections. Performed at [facility], [address], [place], [place] [address]    Studies/Results: No results found.  Medications:  Prior to Admission:  Medications Prior to Admission  Medication Sig Dispense Refill Last Dose  . albuterol (PROAIR HFA) 108 (90 Base) MCG/ACT inhaler Inhale 2 puffs into the lungs every 4 (four) hours as needed. 8 g 0 06/29/2018 at Unknown time  . albuterol (PROVENTIL) (2.5 MG/3ML) 0.083% nebulizer solution Take 3 mLs (2.5 mg total) by nebulization every 6 (six) hours as needed for wheezing or shortness of breath. 75 mL 0 06/29/2018 at Unknown time  . ALPRAZolam (XANAX) 0.5 MG tablet Take 1 tablet (0.5 mg total) by mouth 3 (three) times daily. 45 tablet 0 06/28/2018 at Unknown time  . [EXPIRED] amoxicillin-clavulanate (AUGMENTIN) 875-125 MG tablet Take 1 tablet by mouth 2 (two) times daily for 6 days. X 10 days 12 tablet 0 06/29/2018 at Unknown time  . cetirizine (ZYRTEC) 10 MG tablet Take 10 mg by mouth daily as needed for allergies.   06/29/2018 at Unknown time  . furosemide (LASIX) 40 MG tablet Take 1 tablet (40 mg  total) by mouth daily. Beginning 06/26/18 30 tablet 0 06/29/2018 at Unknown time  . linaclotide (LINZESS) 145 MCG CAPS capsule Take 1 capsule (145 mcg total) by mouth daily as needed. 30 capsule 0 06/28/2018 at Unknown time  . mupirocin ointment (BACTROBAN) 2 % Apply 1 application topically 2 (two) times daily.    06/28/2018 at Unknown time  . potassium chloride (K-DUR) 10 MEQ tablet Take 1 tablet (10 mEq total) by mouth daily. 30 tablet 0 06/28/2018 at Unknown time  . Probiotic Product (RISA-BID PROBIOTIC) TABS Take 1 tablet by mouth 2 (two) times a day.   06/28/2018 at Unknown time  . tamsulosin (FLOMAX) 0.4 MG CAPS capsule Take 1 capsule (0.4 mg total) by mouth daily. 30 capsule 0 06/28/2018 at Unknown time  . [EXPIRED] traMADol (ULTRAM) 50 MG tablet Take 2 tablets (100 mg total) by mouth every 4 (four) hours as needed for up to 6 days. For 8-10/10 pain 14 tablet 0 06/29/2018 at Unknown time  . zolpidem (AMBIEN) 5 MG tablet Take 1 tablet (5 mg total) by mouth at bedtime as needed for up to 13 days for sleep. X 14 days 5 tablet 0 06/28/2018 at Unknown time  . Amino Acids-Protein Hydrolys (FEEDING SUPPLEMENT, PRO-STAT SUGAR FREE 64,) LIQD Take 30 mLs by mouth 3 (three) times daily with meals.   Not Taking at Unknown time  . furosemide (LASIX) 40 MG tablet Take 40 mg by mouth 2 (two) times daily.      . Memantine HCl ER (NAMENDA XR TITRATION PACK) 7 & 14 & 21 &28 MG CP24 Take 6-28 mg by mouth daily. 6/19 - 06/30/2018 = 7 mg daily 6/27 - 07/08/2018 = 14 mg daily 7/5 - 07/16/2018 - 21 mg 07/17/19 - Begin 28 mg daily (Patient not taking: Reported on 06/29/2018) 30 capsule 0 Not Taking at Unknown time  . NON FORMULARY Diet Type:  Mechanical soft,regular diet   Not Taking at Unknown time  . pantoprazole (PROTONIX) 40 MG tablet Take 1 tablet (40 mg total) by mouth 2 (two) times daily before a meal. (Patient not taking: Reported on  06/29/2018) 60 tablet 0 Not Taking at Unknown time  . umeclidinium-vilanterol (ANORO ELLIPTA)  62.5-25 MCG/INH AEPB Inhale 1 puff into the lungs daily. (Patient not taking: Reported on 06/29/2018) 60 each 0 Not Taking at Unknown time  . UNABLE TO FIND Silver Alginate - Clean wound left medial heel with N / S, apply sliver alginate and allevyn foam dressing,  Change daily and as needed   Not Taking at Unknown time  . Wound Dressings (ALLEVYN ADHESIVE EX) Allevyn Foam Dressing:   Apply to kyphotic bony prominence of spine and to lesion area right upper back and change every 3 days and as needed      Scheduled: . enoxaparin (LOVENOX) injection  40 mg Subcutaneous Q24H  . furosemide  20 mg Intravenous Q12H  . mupirocin ointment   Topical Daily  . potassium chloride  10 mEq Oral Daily   Continuous: . ceFEPime (MAXIPIME) IV 2 g (07/02/18 2130)   YKD:[id number] **OR** acetaminophen, albuterol, ALPRAZolam, linaclotide, ondansetron **OR** ondansetron (ZOFRAN) IV, polyethylene glycol, traMADol, zolpidem  Assesment: She was admitted with heart failure and pneumonia.  She is improving.  She has been difficult to manage at home.  She has a recent hip fracture and it is recommended that she go to skilled care facility for rehab.  I am going to switch her to oral diuretics and oral antibiotics today and she should be ready for transfer to skilled care facility tomorrow. Active Problems:   Acute CHF (congestive heart failure) ([place])   Pneumonia    Plan: As above    LOS: 3 days   Alonza Bogus 07/03/2018, 8:34 AM

## 2018-07-03 NOTE — NC FL2 (Signed)
[place] LEVEL OF CARE SCREENING TOOL     IDENTIFICATION  Patient Name: [name] Birthdate: February 28, 1934 Sex: female Admission Date (Current Location): 06/29/2018  [facility] and [place] Number:  [employer] and Address:  [facility] [address], [place]      Provider Number: [id number]  Attending Physician Name and Address:  [name], MD  Relative Name and Phone Number:  Rebecka Apley [phone]    Current Level of Care: Hospital Recommended Level of Care: [place] Prior Approval Number:    Date Approved/Denied:   PASRR Number:    Discharge Plan: SNF    Current Diagnoses: Patient Active Problem List   Diagnosis Date Noted  . Pneumonia 06/30/2018  . Acute CHF (congestive heart failure) ([place]) 06/29/2018  . Vascular dementia without behavioral disturbance ([place]) 06/26/2018  . CAP (community acquired pneumonia) 06/23/2018  . Bilateral pleural effusion 06/23/2018  . Leukocytosis 06/20/2018  . Chronic anemia 06/20/2018  . Cognitive impairment 06/19/2018  . Bilateral lower extremity edema 06/18/2018  . Protein-calorie malnutrition, severe ([place]) 06/18/2018  . S/P internal fixation right hip fracture cannulated screw placement 04/06/18 04/27/2018  . Urinary tract infection associated with catheterization of urinary tract ([place]) 04/11/2018  . Altered mental status 04/10/2018  . Anxiety 04/10/2018  . Hyponatremia 04/10/2018  . UTI (urinary tract infection) due to urinary indwelling catheter ([place]) 04/10/2018  . Chronic constipation 04/09/2018  . Urinary retention 04/09/2018  . Closed right hip fracture ([place]) 04/05/2018  . GERD (gastroesophageal reflux disease) 04/05/2018  . COPD (chronic obstructive pulmonary disease) ([place]) 04/05/2018  . Hiatal hernia   . Dyspepsia 04/04/2014  . Hematochezia 04/30/2011  . RUQ pain 03/25/2011  . Adenomatous polyps 03/25/2011  . ABDOMINAL PAIN, LEFT LOWER QUADRANT  10/16/2009  . HYPERLIPIDEMIA 10/09/2009  . BRONCHITIS 10/09/2009  . Osteoporosis 10/09/2009    Orientation RESPIRATION BLADDER Height & Weight     Self, Situation, Place, Time  Normal Continent Weight: 117 lb 1 oz (53.1 kg) Height:  5\' 1"  (154.9 cm)  BEHAVIORAL SYMPTOMS/MOOD NEUROLOGICAL BOWEL NUTRITION STATUS      Continent Diet(see dc summary)  AMBULATORY STATUS COMMUNICATION OF NEEDS Skin   Extensive Assist Verbally Normal                       Personal Care Assistance Level of Assistance  Bathing, Feeding, Dressing Bathing Assistance: Limited assistance Feeding assistance: Independent Dressing Assistance: Limited assistance     Functional Limitations Info  Sight, Hearing, Speech Sight Info: Adequate Hearing Info: Adequate Speech Info: Adequate    SPECIAL CARE FACTORS FREQUENCY  PT (By licensed PT), OT (By licensed OT)     PT Frequency: 5 times week OT Frequency: 3 times week            Contractures Contractures Info: Not present    Additional Factors Info  Code Status, Allergies, Psychotropic Code Status Info: full Allergies Info: Codeine, Levofloxacin, Sulfonamide, Sulfa Antibiotics Psychotropic Info: Xanax         Current Medications (07/03/2018):  This is the current hospital active medication list Current Facility-Administered Medications  Medication Dose Route Frequency Provider Last Rate Last Dose  . acetaminophen (TYLENOL) tablet 650 mg  650 mg Oral Q6H PRN Emokpae, Ejiroghene E, MD   650 mg at 07/02/18 1637   Or  . acetaminophen (TYLENOL) suppository 650 mg  650 mg Rectal Q6H PRN Emokpae, Ejiroghene E, MD      .  albuterol (PROVENTIL) (2.5 MG/3ML) 0.083% nebulizer solution 2.5 mg  2.5 mg Nebulization Q6H PRN Emokpae, Ejiroghene E, MD      . ALPRAZolam Duanne Moron) tablet 0.5 mg  0.5 mg Oral TID PRN Emokpae, Ejiroghene E, MD   0.5 mg at 07/02/18 2124  . enoxaparin (LOVENOX) injection 40 mg  40 mg Subcutaneous Q24H Emokpae, Ejiroghene E, MD   40 mg  at 07/02/18 2124  . furosemide (LASIX) injection 20 mg  20 mg Intravenous Emmit Pomfret, MD   20 mg at 07/03/18 0950  . linaclotide (LINZESS) capsule 145 mcg  145 mcg Oral Daily PRN Emokpae, Ejiroghene E, MD      . mupirocin ointment (BACTROBAN) 2 %   Topical Daily [name], MD      . ondansetron [facility]) tablet 4 mg  4 mg Oral Q6H PRN Emokpae, Ejiroghene E, MD       Or  . ondansetron (ZOFRAN) injection 4 mg  4 mg Intravenous Q6H PRN Emokpae, Ejiroghene E, MD   4 mg at 07/03/18 0947  . polyethylene glycol (MIRALAX / GLYCOLAX) packet 17 g  17 g Oral Daily PRN Emokpae, Ejiroghene E, MD      . potassium chloride (K-DUR) CR tablet 10 mEq  10 mEq Oral Daily Emokpae, Ejiroghene E, MD   10 mEq at 07/03/18 0954  . torsemide (DEMADEX) tablet 20 mg  20 mg Oral BID [name], MD      . traMADol Veatrice Bourbon) tablet 50 mg  50 mg Oral Q6H PRN [name], MD   50 mg at 07/02/18 2134  . zolpidem (AMBIEN) tablet 5 mg  5 mg Oral QHS PRN Emokpae, Ejiroghene E, MD   5 mg at 07/01/18 2312     Discharge Medications: Please see discharge summary for a list of discharge medications.  Relevant Imaging Results:  Relevant Lab Results:   Additional Information SSN: [id number]  Shade Flood, LCSW

## 2018-07-03 NOTE — Care Management Important Message (Signed)
Important Message  Patient Details  Name: [name] MRN: [id number] Date of Birth: [birth year]   Medicare Important Message Given:  Yes     Tommy Medal 07/03/2018, 12:26 PM

## 2018-07-04 DIAGNOSIS — Z7409 Other reduced mobility: Secondary | ICD-10-CM | POA: Diagnosis not present

## 2018-07-04 DIAGNOSIS — R5381 Other malaise: Secondary | ICD-10-CM | POA: Diagnosis not present

## 2018-07-04 DIAGNOSIS — F0390 Unspecified dementia without behavioral disturbance: Secondary | ICD-10-CM | POA: Diagnosis not present

## 2018-07-04 DIAGNOSIS — Z9889 Other specified postprocedural states: Secondary | ICD-10-CM | POA: Diagnosis not present

## 2018-07-04 DIAGNOSIS — J811 Chronic pulmonary edema: Secondary | ICD-10-CM | POA: Diagnosis not present

## 2018-07-04 DIAGNOSIS — M7552 Bursitis of left shoulder: Secondary | ICD-10-CM | POA: Diagnosis not present

## 2018-07-04 DIAGNOSIS — S72001D Fracture of unspecified part of neck of right femur, subsequent encounter for closed fracture with routine healing: Secondary | ICD-10-CM | POA: Diagnosis not present

## 2018-07-04 DIAGNOSIS — R41841 Cognitive communication deficit: Secondary | ICD-10-CM | POA: Diagnosis present

## 2018-07-04 DIAGNOSIS — J449 Chronic obstructive pulmonary disease, unspecified: Secondary | ICD-10-CM | POA: Diagnosis not present

## 2018-07-04 DIAGNOSIS — E871 Hypo-osmolality and hyponatremia: Secondary | ICD-10-CM | POA: Diagnosis not present

## 2018-07-04 DIAGNOSIS — I5023 Acute on chronic systolic (congestive) heart failure: Secondary | ICD-10-CM | POA: Diagnosis present

## 2018-07-04 DIAGNOSIS — I5022 Chronic systolic (congestive) heart failure: Secondary | ICD-10-CM | POA: Diagnosis present

## 2018-07-04 DIAGNOSIS — S30810D Abrasion of lower back and pelvis, subsequent encounter: Secondary | ICD-10-CM | POA: Diagnosis not present

## 2018-07-04 DIAGNOSIS — M6281 Muscle weakness (generalized): Secondary | ICD-10-CM | POA: Diagnosis present

## 2018-07-04 DIAGNOSIS — R69 Illness, unspecified: Secondary | ICD-10-CM | POA: Diagnosis not present

## 2018-07-04 DIAGNOSIS — D649 Anemia, unspecified: Secondary | ICD-10-CM | POA: Diagnosis not present

## 2018-07-04 DIAGNOSIS — I509 Heart failure, unspecified: Secondary | ICD-10-CM | POA: Diagnosis not present

## 2018-07-04 DIAGNOSIS — M810 Age-related osteoporosis without current pathological fracture: Secondary | ICD-10-CM | POA: Diagnosis not present

## 2018-07-04 DIAGNOSIS — R1311 Dysphagia, oral phase: Secondary | ICD-10-CM | POA: Diagnosis present

## 2018-07-04 DIAGNOSIS — R2681 Unsteadiness on feet: Secondary | ICD-10-CM | POA: Diagnosis present

## 2018-07-04 DIAGNOSIS — I517 Cardiomegaly: Secondary | ICD-10-CM | POA: Diagnosis not present

## 2018-07-04 DIAGNOSIS — L89620 Pressure ulcer of left heel, unstageable: Secondary | ICD-10-CM | POA: Diagnosis not present

## 2018-07-04 DIAGNOSIS — Z7401 Bed confinement status: Secondary | ICD-10-CM | POA: Diagnosis not present

## 2018-07-04 DIAGNOSIS — E875 Hyperkalemia: Secondary | ICD-10-CM | POA: Diagnosis not present

## 2018-07-04 DIAGNOSIS — Z209 Contact with and (suspected) exposure to unspecified communicable disease: Secondary | ICD-10-CM | POA: Diagnosis not present

## 2018-07-04 DIAGNOSIS — Z8781 Personal history of (healed) traumatic fracture: Secondary | ICD-10-CM | POA: Diagnosis not present

## 2018-07-04 DIAGNOSIS — J189 Pneumonia, unspecified organism: Secondary | ICD-10-CM | POA: Diagnosis present

## 2018-07-04 LAB — BASIC METABOLIC PANEL
Anion gap: 10 (ref 5–15)
BUN: 21 mg/dL (ref 8–23)
CO2: 26 mmol/L (ref 22–32)
Calcium: 8.1 mg/dL — ABNORMAL LOW (ref 8.9–10.3)
Chloride: 95 mmol/L — ABNORMAL LOW (ref 98–111)
Creatinine, Ser: 0.66 mg/dL (ref 0.44–1.00)
GFR calc Af Amer: >60 mL/min (ref >60)
GFR calc non Af Amer: >60 mL/min (ref >60)
Glucose, Bld: 97 mg/dL (ref 70–99)
Potassium: 4.5 mmol/L (ref 3.5–5.1)
Sodium: 131 mmol/L — ABNORMAL LOW (ref 135–145)

## 2018-07-04 LAB — CBC WITH DIFFERENTIAL/PLATELET
Abs Immature Granulocytes: 0.09 10*3/uL — ABNORMAL HIGH (ref 0.00–0.07)
Basophils Absolute: 0 10*3/uL (ref 0.0–0.1)
Basophils Relative: 0 %
Eosinophils Absolute: 0.6 10*3/uL — ABNORMAL HIGH (ref 0.0–0.5)
Eosinophils Relative: 6 %
HCT: 27.1 % — ABNORMAL LOW (ref 36.0–46.0)
Hemoglobin: 8 g/dL — ABNORMAL LOW (ref 12.0–15.0)
Immature Granulocytes: 1 %
Lymphocytes Relative: 14 %
Lymphs Abs: 1.4 10*3/uL (ref 0.7–4.0)
MCH: 23.8 pg — ABNORMAL LOW (ref 26.0–34.0)
MCHC: 29.5 g/dL — ABNORMAL LOW (ref 30.0–36.0)
MCV: 80.7 fL (ref 80.0–100.0)
Monocytes Absolute: 1.2 10*3/uL — ABNORMAL HIGH (ref 0.1–1.0)
Monocytes Relative: 12 %
Neutro Abs: 7.2 10*3/uL (ref 1.7–7.7)
Neutrophils Relative %: 67 %
Platelets: 570 10*3/uL — ABNORMAL HIGH (ref 150–400)
RBC: 3.36 MIL/uL — ABNORMAL LOW (ref 3.87–5.11)
RDW: 14.4 % (ref 11.5–15.5)
WBC: 10.6 10*3/uL — ABNORMAL HIGH (ref 4.0–10.5)
nRBC: 0 % (ref 0.0–0.2)

## 2018-07-04 MED ORDER — TRAMADOL HCL 50 MG PO TABS: 50.0000 mg | ORAL_TABLET | Freq: Four times a day (QID) | ORAL | 0 refills | Status: DC | PRN

## 2018-07-04 MED ORDER — AMOXICILLIN-POT CLAVULANATE 875-125 MG PO TABS: 1.0000 | ORAL_TABLET | Freq: Two times a day (BID) | ORAL | 0 refills | Status: AC

## 2018-07-04 MED ORDER — TORSEMIDE 20 MG PO TABS: 20.0000 mg | ORAL_TABLET | Freq: Two times a day (BID) | ORAL | Status: DC

## 2018-07-04 NOTE — Discharge Summary (Signed)
Physician Discharge Summary  Patient ID: [name] MRN: [id number] DOB/AGE: 03-12-1934 84 y.o. Primary Care Physician:Eriberto Felch, Percell Miller, MD Admit date: 06/29/2018 Discharge date: 07/04/2018    Discharge Diagnoses:   Active Problems:   GERD (gastroesophageal reflux disease)   COPD (chronic obstructive pulmonary disease) (HCC)   Anxiety   S/P internal fixation right hip fracture cannulated screw placement 04/06/18   Protein-calorie malnutrition, severe (HCC)   Vascular dementia without behavioral disturbance (HCC)   Acute CHF (congestive heart failure) (HCC)   Pneumonia   Allergies as of 07/04/2018      Reactions   Codeine Shortness Of Breath, Rash   Codeine Anaphylaxis   Levofloxacin Anaphylaxis   Patient was admitted to hospital with lung problems due to this medication   Sulfonamide Derivatives Hives, Shortness Of Breath   Sulfa Antibiotics Hives      Medication List    STOP taking these medications   furosemide 40 MG tablet Commonly known as: LASIX   Namenda XR Titration Pack 7 & 14 & 21 &28 MG Cp24 Generic drug: Memantine HCl ER   pantoprazole 40 MG tablet Commonly known as: PROTONIX     TAKE these medications   albuterol 108 (90 Base) MCG/ACT inhaler Commonly known as: ProAir HFA Inhale 2 puffs into the lungs every 4 (four) hours as needed.   albuterol (2.5 MG/3ML) 0.083% nebulizer solution Commonly known as: PROVENTIL Take 3 mLs (2.5 mg total) by nebulization every 6 (six) hours as needed for wheezing or shortness of breath.   ALLEVYN ADHESIVE EX Allevyn Foam Dressing:   Apply to kyphotic bony prominence of spine and to lesion area right upper back and change every 3 days and as needed   ALPRAZolam 0.5 MG tablet Commonly known as: XANAX Take 1 tablet (0.5 mg total) by mouth 3 (three) times daily.   amoxicillin-clavulanate 875-125 MG tablet Commonly known as: Augmentin Take 1 tablet by mouth 2 (two) times daily for 5 days. What changed: additional  instructions   cetirizine 10 MG tablet Commonly known as: ZYRTEC Take 10 mg by mouth daily as needed for allergies.   feeding supplement (PRO-STAT SUGAR FREE 64) Liqd Take 30 mLs by mouth 3 (three) times daily with meals.   linaclotide 145 MCG Caps capsule Commonly known as: LINZESS Take 1 capsule (145 mcg total) by mouth daily as needed.   mupirocin ointment 2 % Commonly known as: BACTROBAN Apply 1 application topically 2 (two) times daily.   NON FORMULARY Diet Type:  Mechanical soft,regular diet   potassium chloride 10 MEQ tablet Commonly known as: K-DUR Take 1 tablet (10 mEq total) by mouth daily.   Risa-Bid Probiotic Tabs Take 1 tablet by mouth 2 (two) times a day.   tamsulosin 0.4 MG Caps capsule Commonly known as: FLOMAX Take 1 capsule (0.4 mg total) by mouth daily.   torsemide 20 MG tablet Commonly known as: DEMADEX Take 1 tablet (20 mg total) by mouth 2 (two) times daily.   traMADol 50 MG tablet Commonly known as: ULTRAM Take 1 tablet (50 mg total) by mouth every 6 (six) hours as needed (pain). What changed:   how much to take  when to take this  reasons to take this  additional instructions   umeclidinium-vilanterol 62.5-25 MCG/INH Aepb Commonly known as: ANORO ELLIPTA Inhale 1 puff into the lungs daily.   UNABLE TO FIND Silver Alginate - Clean wound left medial heel with N / S, apply sliver alginate and allevyn foam dressing,  Change daily and  as needed   zolpidem 5 MG tablet Commonly known as: AMBIEN Take 1 tablet (5 mg total) by mouth at bedtime as needed for up to 13 days for sleep. X 14 days       Discharged Condition: Improved    Consults: None  Significant Diagnostic Studies: Dg Chest 2 View  Result Date: 06/20/2018 CLINICAL DATA:  Cough and congestion. EXAM: CHEST - 2 VIEW COMPARISON:  06/03/2018 FINDINGS: The heart is enlarged but stable. There is tortuosity and calcification of the thoracic aorta. Underlying chronic lung  disease with emphysema. New bilateral infiltrates and persistent effusions. Stable large hiatal hernia. The bony thorax is intact. IMPRESSION: New bilateral infiltrates and persistent effusions. Electronically Signed   By: Marijo Sanes M.D.   On: 06/20/2018 12:59   Ct Angio Chest Pe W Or Wo Contrast  Result Date: 06/29/2018 CLINICAL DATA:  Shortness of breath and chest pain EXAM: CT ANGIOGRAPHY CHEST WITH CONTRAST TECHNIQUE: Multidetector CT imaging of the chest was performed using the standard protocol during bolus administration of intravenous contrast. Multiplanar CT image reconstructions and MIPs were obtained to evaluate the vascular anatomy. CONTRAST:  152mL OMNIPAQUE IOHEXOL 350 MG/ML SOLN COMPARISON:  Radiograph 06/29/2018, CT 02/17/2018 FINDINGS: Cardiovascular: Satisfactory opacification of the pulmonary arteries to the segmental level. No evidence of pulmonary embolism. Nonaneurysmal aorta. No dissection is seen. Mild to moderate aortic atherosclerosis. Tortuous aorta. Coronary vascular calcifications. Heart size upper normal. No pericardial effusion. Mediastinum/Nodes: Midline trachea. No suspicious thyroid mass. No significant adenopathy. Thickened appearance of the distal esophagus. Large hiatal hernia containing most of the stomach, mesenteric fat, and some of the colon. This is increased compared to prior CT. Lungs/Pleura: Small bilateral pleural effusions which are new. Partial atelectasis within the lower lobes. Mild subpleural fibrosis within the right middle lobe lingula and lower lobes. Peribronchial ground-glass density in the left upper lobe and superior segment of left lower lobe with additional scattered foci of ground-glass density in the right upper lobe and superior segment right lower lobe. Increased septal thickening, suggesting mild underlying edema. Upper Abdomen: No acute abnormality. Musculoskeletal: Kyphosis of the spine. Multiple chronic compression fractures T6 through T9  and at T11 with increased loss of height of T11 vertebral body since 02/17/2018 CT. Review of the MIP images confirms the above findings. IMPRESSION: 1. Negative for acute pulmonary embolus or aortic dissection. 2. Small bilateral pleural effusions with partial atelectasis at the lower lobes. Multifocal ground-glass densities within the left perihilar region, upper lobes and superior segments of the lower lobes, possible multifocal pneumonia. Diffuse bilateral septal thickening, suggesting underlying interstitial edema. 3. Large hiatal hernia containing stomach, mesentery and colon. This appears slightly increased compared to prior CT Aortic aneurysm NOS (ICD10-I71.9). Electronically Signed   By: Donavan Foil M.D.   On: 06/29/2018 20:45   Dg Chest Portable 1 View  Result Date: 06/29/2018 CLINICAL DATA:  Chest pain and shortness of breath. EXAM: PORTABLE CHEST 1 VIEW COMPARISON:  06/20/2018 FINDINGS: The heart is mildly enlarged but stable. Fairly significant central vascular congestion and interstitial pulmonary edema with bilateral pleural effusions and bibasilar atelectasis. Large hiatal hernia. IMPRESSION: Findings consistent with CHF. Electronically Signed   By: Marijo Sanes M.D.   On: 06/29/2018 14:17   Dg Hip Unilat With Pelvis 2-3 Views Right  Result Date: 06/09/2018 Hip unilateral with or without pelvis 2-3 views right Fracture right hip internal fixation follow-up Internal fixation on April 2 3 screws 3 washers femoral neck fracture with excellent screw position and healing  no evidence of avascular necrosis or nonunion Impression stable internal fixation right hip adequate healing   Lab Results: Basic Metabolic Panel: Recent Labs    07/02/18 0637 07/03/18 0457 07/04/18 0426  NA 132*  --  131*  K 3.8  --  4.5  CL 96*  --  95*  CO2 28  --  26  GLUCOSE 100*  --  97  BUN 19  --  21  CREATININE 0.73 0.67 0.66  CALCIUM 8.2*  --  8.1*   Liver Function Tests: Recent Labs     07/02/18 0637  AST 13*  ALT 10  ALKPHOS 99  BILITOT 0.3  PROT 6.2*  ALBUMIN 2.1*     CBC: Recent Labs    07/02/18 0637 07/04/18 0426  WBC 9.8 10.6*  NEUTROABS 7.2 7.2  HGB 8.5* 8.0*  HCT 28.9* 27.1*  MCV 80.3 80.7  PLT 602* 570*    Recent Results (from the past 240 hour(s))  SARS Coronavirus 2 (CEPHEID- Performed in [facility] hospital lab), Hosp Order     Status: None   Collection Time: 06/29/18  1:34 PM   Specimen: Nasopharyngeal Swab  Result Value Ref Range Status   SARS Coronavirus 2 NEGATIVE NEGATIVE Final    Comment: (NOTE) If result is NEGATIVE SARS-CoV-2 target nucleic acids are NOT DETECTED. The SARS-CoV-2 RNA is generally detectable in upper and lower  respiratory specimens during the acute phase of infection. The lowest  concentration of SARS-CoV-2 viral copies this assay can detect is 250  copies / mL. A negative result does not preclude SARS-CoV-2 infection  and should not be used as the sole basis for treatment or other  patient management decisions.  A negative result may occur with  improper specimen collection / handling, submission of specimen other  than nasopharyngeal swab, presence of viral mutation(s) within the  areas targeted by this assay, and inadequate number of viral copies  (<250 copies / mL). A negative result must be combined with clinical  observations, patient history, and epidemiological information. If result is POSITIVE SARS-CoV-2 target nucleic acids are DETECTED. The SARS-CoV-2 RNA is generally detectable in upper and lower  respiratory specimens dur ing the acute phase of infection.  Positive  results are indicative of active infection with SARS-CoV-2.  Clinical  correlation with patient history and other diagnostic information is  necessary to determine patient infection status.  Positive results do  not rule out bacterial infection or co-infection with other viruses. If result is PRESUMPTIVE POSTIVE SARS-CoV-2 nucleic  acids MAY BE PRESENT.   A presumptive positive result was obtained on the submitted specimen  and confirmed on repeat testing.  While 2019 novel coronavirus  (SARS-CoV-2) nucleic acids may be present in the submitted sample  additional confirmatory testing may be necessary for epidemiological  and / or clinical management purposes  to differentiate between  SARS-CoV-2 and other Sarbecovirus currently known to infect humans.  If clinically indicated additional testing with an alternate test  methodology [phone]) is advised. The SARS-CoV-2 RNA is generally  detectable in upper and lower respiratory sp ecimens during the acute  phase of infection. The expected result is Negative. Fact Sheet for Patients:  StrictlyIdeas.no Fact Sheet for Healthcare Providers: BankingDealers.co.za This test is not yet approved or cleared by the [place] FDA and has been authorized for detection and/or diagnosis of SARS-CoV-2 by FDA under an Emergency Use Authorization (EUA).  This EUA will remain in effect (meaning this test can be used) for the  duration of the COVID-19 declaration under Section 564(b)(1) of the Act, 21 U.S.C. section 360bbb-3(b)(1), unless the authorization is terminated or revoked sooner. Performed at [facility], [address] [place], [place] [address]   MRSA PCR Screening     Status: None   Collection Time: 06/30/18  5:48 AM   Specimen: Nasopharyngeal  Result Value Ref Range Status   MRSA by PCR NEGATIVE NEGATIVE Final    Comment:        The GeneXpert MRSA Assay (FDA approved for NASAL specimens only), is one component of a comprehensive MRSA colonization surveillance program. It is not intended to diagnose MRSA infection nor to guide or monitor treatment for MRSA infections. Performed at [facility], [address], [place], [place] [address]      Hospital Course: This is an 84 year old whose had increasing problems  with multiple hospitalizations in the last 6 months.  She had a recent hip fracture was sent to a skilled care facility but became very confused and one was unable to participate in care.  She was discharged home but developed increasing problems with shortness of breath.  She came to the hospital with what appeared to be heart failure plus pneumonia.  She had already started antibiotics before she was admitted.  She was treated with diuresis and antibiotics and improved.  It was recommended again that she go to skilled care facility and she reluctantly agrees.  Discharge Exam: Blood pressure 105/68, pulse 81, temperature 98.5 F (36.9 C), temperature source Oral, resp. rate 20, height 5\' 1"  (1.549 m), weight 53 kg, SpO2 97 %. She is awake and alert.  Chest is clear now.  Minimal edema.  Disposition: To skilled care facility when beds available      Signed: Alonza Bogus   07/04/2018, 9:02 AM

## 2018-07-04 NOTE — Clinical Social Work Note (Signed)
Patient Information  Patient Name  [name], [name] ([id number]) Sex  Female DOB  [birth year] SSN [id number]  Room Bed  A311 A311-01  Patient Demographics  Address (Confidential)  [address]  [place] [address] Phone (Confidential)  [phone] (Home)  [phone] (Mobile)  Patient Ethnicity & Race  Ethnic Group Patient Race  Not Hispanic or Latino White or Caucasian  Emergency Contact(s)  Name Relation Home Work Mobile  [place] Daughter   [phone]  pulliam, frank    [phone]  Ferd Glassing Granddaughter   [phone]  Documents on File   Status Date Received Description  Documents for the Patient  EMR Medication Summary Not Received    EMR Problem Summary Not Received    EMR Patient Summary Not Received    Driver's License Not Received    Historic Radiology Documentation Not Received    [place] Received 01/19/11   [place] E-Signature HIPAA Notice of Privacy Received 02/17/11   [place] E-Signature HIPAA Notice of Privacy Spanish Not Received    Insurance Card Received 03/24/11   Advance Directives/Living Will/HCPOA/POA Not Received    Financial Application Not Received    [place] HIPAA NOTICE OF PRIVACY - Scanned Not Received    Insurance Card Received 04/04/14   HIM ROI Authorization Not Received    Other Photo ID Not Received    HIM ROI Authorization  04/15/14   [place] HIPAA NOTICE OF PRIVACY - Scanned Not Received    [place] E-Signature HIPAA Notice of Privacy Signed 32/67/12   Driver's License Not Received    Insurance Card Not Received    Advance Directives/Living Will/HCPOA/POA Not Received    Other Photo ID Not Received    Insurance Card     HIM ROI Authorization  06/12/14   AMB Correspondence  05/27/14 NOTICE OF APPROVAL SILVERSCRIPT  Insurance Card   MEDICARE A&B/[place] MEDICAID/ROSM  AMB HH/NH/Hospice  07/29/14 POC [facility]  AMB HH/NH/Hospice  08/02/14 ORDER  [facility]  AMB HH/NH/Hospice  08/09/14 MISSED VISIT REPORT [facility]  AMB Correspondence  08/15/14 RX ORDER [facility] [facility]IC[facility]  Insurance Card   MEDICARE A&B + [place] MEDICAID/ROSM/C  AMB HH/NH/Hospice  45/80/99 CERTIFICATION & POC [facility]  AMB Provider Completed Forms  10/16/14 PRESCRIBER RESPONSE SILVER SCRIPT  AMB Provider Completed Forms  10/16/14 PRESCRIBER RESPONSE FORM SILVER SCRIPT  Advanced Beneficiary Notice (ABN) Not Received    E-Signature AOB Spanish Not Received    Insurance Card Received 11/25/14 ROSM  AMB Correspondence  03/20/15 NOTICE OF DISMISSAL SILVERSCRIPTS  Insurance Card     Release of Information Received 08/24/17 2019 [facility] DPR  AMB Correspondence Received 08/28/17 05/19-04/19 CLINICAL NOTE HAWKINS MD, E.  [place] Received 04/05/18 unable to sign due to medical condition-weakness  [place] E-Signature HIPAA Notice of Privacy Unable to Obtain 83/38/25   Driver's License (Deleted) 05/39/76   AMB Correspondence (Deleted) 10/16/14 PRESCRIBER RESPONSE SILVER SCRIPT  AMB Correspondence Received (Deleted) 05/16/17 CLINICAL NOTE HAWKINS MD, E.  Documents for the Encounter  AOB (Assignment of Insurance Benefits) Received 06/29/18 verbal consent due to national emergency  E-signature AOB     MEDICARE RIGHTS Received 06/29/18 verbal consent due to national emergency  E-signature Medicare Rights     Cardiac Monitoring Strip Received 06/29/18   Cardiac Monitoring Strip Shift Summary Received 06/30/18   ED Patient Billing Extract   ED PB  Billing Extract  HIM Release of Information Output   [id number]  EKG Received (Deleted) 06/30/18   EKG Received 06/30/18   Admission Information  Current Information  Attending Provider Admitting Provider Admission Type Admission Status  [name], MD Bethena Roys, MD Emergency Admission (Confirmed)        Admission Date/Time Discharge Date Hospital Service Auth/Cert Status  86/76/19 01:01 PM  Internal Medicine [place] Unit Room/Bed   [facility] AP-DEPT 300 A311/A311-01        Admission  [facility] Account  Name Acct ID Class Status Primary Coverage  [name], [name] [id number] Inpatient Open MEDICARE - MEDICARE PART A AND B      Guarantor Account (for Hospital Account [id number])  Name Relation to Pt Service Area Active? Acct Type  [name] Self CHSA Yes Personal/Family  Address Phone    [address]  [place], [place] [address] [phone])        Coverage Information (for Hospital Account [id number])  1. [place] PART A AND B  F/O Payor/Plan Precert #  MEDICARE/MEDICARE PART A AND B   Subscriber Subscriber #  [place], [place] [id number]  Address Phone  [address] [place], [place] [address]   2. MEDICAID [place]/MEDICAID OF [place]  F/O Payor/Plan Precert #  MEDICAID [place]/MEDICAID OF [place]   Subscriber Subscriber #  [name], [name] [id number] L  Address Phone  [address] [address]  [place], [place] [address] [phone]       Care Everywhere ID:  [id number]   Patient Information  Patient Name  [name], [name] ([id number]) Sex  Female DOB  1934/03/23  Room Bed  A311 A311-01  Patient Demographics  Address (Confidential)  [address]  [place] [address] Phone (Confidential)  [phone] (Home)  [phone] (Mobile)  Patient Ethnicity & Race  Ethnic Group Patient Race  Not Hispanic or Latino White or Caucasian  Emergency Contact(s)  Name Relation Home Work Mobile  [place] Daughter   [phone]  pulliam, frank    [phone]  Ferd Glassing Granddaughter   [phone]  Documents on File   Status Date Received Description  Documents for the Patient  EMR Medication Summary Not Received    EMR Problem Summary Not Received    EMR Patient Summary Not Received    Driver's License Not Received     Historic Radiology Documentation Not Received    [place] Received 01/19/11   [place] E-Signature HIPAA Notice of Privacy Received 02/17/11   [place] E-Signature HIPAA Notice of Privacy Spanish Not Received    Insurance Card Received 03/24/11   Advance Directives/Living Will/HCPOA/POA Not Received    Financial Application Not Received    [place] HIPAA NOTICE OF PRIVACY - Scanned Not Received    Insurance Card Received 04/04/14   HIM ROI Authorization Not Received    Other Photo ID Not Received    HIM ROI Authorization  04/15/14   [place] HIPAA NOTICE OF PRIVACY - Scanned Not Received    [place] E-Signature HIPAA Notice of Privacy Signed 00/17/49   Driver's License Not Received    Insurance Card Not Received    Advance Directives/Living Will/HCPOA/POA Not Received    Other Photo ID Not Received    Insurance Card     HIM ROI Authorization  06/12/14   AMB Correspondence  05/27/14 NOTICE OF APPROVAL [employer] Card  MEDICARE A&B/[place] MEDICAID/ROSM  AMB HH/NH/Hospice  07/29/14 [facility]OC [facility]  AMB HH/NH/Hospice  08/02/14 ORDER [facility] [place]  AMB HH/NH/Hospice  08/09/14 MISSED VISIT REPORT [facility]  AMB Correspondence  08/15/14 RX ORDER [place] [facility] MEDICINE  Insurance Card   MEDICARE A&B + [place] MEDICAID/ROSM/C  AMB HH/NH/Hospice  35/45/62 CERTIFICATION & POC [facility]  AMB Provider Completed Forms  10/16/14 PRESCRIBER RESPONSE SILVER SCRIPT  AMB Provider Completed Forms  10/16/14 PRESCRIBER RESPONSE FORM SILVER SCRIPT  Advanced Beneficiary Notice (ABN) Not Received    E-Signature AOB Spanish Not Received    Insurance Card Received 11/25/14 ROSM  AMB Correspondence  03/20/15 NOTICE OF DISMISSAL SILVERSCRIPTS  Insurance Card     Release of Information Received 08/24/17 2019 [facility] DPR  AMB Correspondence Received 08/28/17 05/19-04/19 CLINICAL NOTE HAWKINS MD, E.   [place] Received 04/05/18 unable to sign due to medical condition-weakness   E-Signature HIPAA Notice of Privacy Unable to Obtain 56/38/93   Driver's License (Deleted) 73/42/87   AMB Correspondence (Deleted) 10/16/14 PRESCRIBER RESPONSE SILVER SCRIPT  AMB Correspondence Received (Deleted) 05/16/17 CLINICAL NOTE HAWKINS MD, E.  Documents for the Encounter  AOB (Assignment of Insurance Benefits) Received 06/29/18 verbal consent due to national emergency  E-signature AOB     MEDICARE RIGHTS Received 06/29/18 verbal consent due to national emergency  E-signature Medicare Rights     Cardiac Monitoring Strip Received 06/29/18   Cardiac Monitoring Strip Shift Summary Received 06/30/18   ED Patient Billing Extract   ED PB Billing Extract  HIM Release of Information Output   [id number]  EKG Received (Deleted) 06/30/18   EKG Received 06/30/18   Admission Information  Current Information  Attending Provider Admitting Provider Admission Type Admission Status  [name], MD Bethena Roys, MD Emergency Admission (Confirmed)       Admission Date/Time Discharge Date Hospital Service Auth/Cert Status  68/11/57 01:01 PM  Internal Medicine [place] Unit Room/Bed   [facility] AP-DEPT 300 A311/A311-01        Admission  [facility] Account  Name Acct ID Class Status Primary Coverage  [name], [name] [id number] Inpatient Open MEDICARE - MEDICARE PART A AND B      Guarantor Account (for Hospital Account [id number])  Name Relation to Pt Service Area Active? Acct Type  [name] Self CHSA Yes Personal/Family  Address Phone    [address]  [place], [place] [address] [phone])        Coverage Information (for Hospital Account [id number])  1. [place] PART A AND B  F/O Payor/Plan Precert #  MEDICARE/MEDICARE PART A AND B   Subscriber Subscriber #   [name], [name] [id number]  Address Phone  [address] [place], [place] [address]   2. MEDICAID [place]/MEDICAID OF [place]  F/O Payor/Plan Precert #  MEDICAID [place]/MEDICAID OF [place]   Subscriber Subscriber #  [name], [name] [id number] L  Address Phone  [address] [address]  [place], [place] [address] [phone]       Care Everywhere ID:  [id number]

## 2018-07-04 NOTE — Progress Notes (Signed)
Physical Therapy Treatment Patient Details Name: [name] MRN: [id number] DOB: [birth year] Today's Date: 07/04/2018    History of Present Illness [name] is a 84 y.o. female with medical history significant for COPD, hyponatremia, dementia, osteoporosis who was brought to the ED with complaints of difficulty breathing over the past 2 days, present with exertion and at rest, dry cough, also chest pain yesterday-worse with exertion improved with rest.  Per chart review chest pain has been continuous, patient tells me at this time she has no chest pain.  Unable to get full details from patient as she is mildly agitated requesting to be transferred upstairs as her bed is not comfortable.It was recently diagnosed with pneumonia at her nursing home, chest x-ray 6/16-new bilateral infiltrates and persistent effusions. She was placed on amoxicillin.    PT Comments    Patient demonstrates increased endurance/distance for gait training without loss of balance, slightly labored movement and limited mostly due to c/o fatigue, on room air with SpO2 between 93-95%.  Patient had difficulty with stand to sits due to not reaching behind for armrest resulting in flopping into chair and tolerated sitting up to eat lunch.  Patient will benefit from continued physical therapy in hospital and recommended venue below to increase strength, balance, endurance for safe ADLs and gait.    Follow Up Recommendations  SNF;Supervision for mobility/OOB;Supervision - Intermittent     Equipment Recommendations  None recommended by PT    Recommendations for Other Services       Precautions / Restrictions Precautions Precautions: Fall Restrictions Weight Bearing Restrictions: No    Mobility  Bed Mobility Overal bed mobility: Needs Assistance Bed Mobility: Supine to Sit     Supine to sit: Supervision     General bed mobility comments: increased time, labored movement  Transfers Overall transfer level:  Needs assistance Equipment used: Rolling walker (2 wheeled) Transfers: Sit to/from [facility] Sit to Stand: Min guard Stand pivot transfers: Min guard;Min assist       General transfer comment: had diffiuclty with stand to sits after ambulation due to fatigue (dropped into chair without reaching behind)  Ambulation/Gait Ambulation/Gait assistance: Min guard;Min assist Gait Distance (Feet): 60 Feet Assistive device: Rolling walker (2 wheeled) Gait Pattern/deviations: Decreased step length - right;Decreased step length - left;Decreased stride length Gait velocity: decreased   General Gait Details: increased enduranc/distance for ambulation with slightly labored cadence without loss of balance   Stairs             Wheelchair Mobility    Modified Rankin (Stroke Patients Only)       Balance Overall balance assessment: Needs assistance Sitting-balance support: Feet supported;No upper extremity supported Sitting balance-Leahy Scale: Good     Standing balance support: Bilateral upper extremity supported;During functional activity Standing balance-Leahy Scale: Fair Standing balance comment: fair using RW                            Cognition Arousal/Alertness: Awake/alert Behavior During Therapy: WFL for tasks assessed/performed Overall Cognitive Status: History of cognitive impairments - at baseline                                        Exercises General Exercises - Lower Extremity Long Arc Quad: Seated;AROM;Strengthening;Both;10 reps Hip Flexion/Marching: Seated;AROM;Strengthening;Both;10 reps Toe Raises: Seated;AROM;Strengthening;Both;10 reps Heel Raises: Seated;AROM;Both;10 reps;Strengthening  General Comments        Pertinent Vitals/Pain Pain Assessment: No/denies pain    Home Living                      Prior Function            PT Goals (current goals can now be found in the care plan  section) Acute Rehab PT Goals Patient Stated Goal: return home PT Goal Formulation: With patient Time For Goal Achievement: 07/14/18 Potential to Achieve Goals: Good Progress towards PT goals: Progressing toward goals    Frequency    Min 3X/week      PT Plan Current plan remains appropriate    Co-evaluation              AM-PAC PT "6 Clicks" Mobility   Outcome Measure  Help needed turning from your back to your side while in a flat bed without using bedrails?: None Help needed moving from lying on your back to sitting on the side of a flat bed without using bedrails?: None Help needed moving to and from a bed to a chair (including a wheelchair)?: A Little Help needed standing up from a chair using your arms (e.g., wheelchair or bedside chair)?: A Little Help needed to walk in hospital room?: A Little Help needed climbing 3-5 steps with a railing? : A Lot 6 Click Score: 19    End of Session   Activity Tolerance: Patient tolerated treatment well Patient left: in chair;with call bell/phone within reach;with chair alarm set Nurse Communication: Mobility status PT Visit Diagnosis: Unsteadiness on feet (R26.81);Other abnormalities of gait and mobility (R26.89);Muscle weakness (generalized) (M62.81)     Time: 0947-0962 PT Time Calculation (min) (ACUTE ONLY): 28 min  Charges:  $Gait Training: 8-22 mins $Therapeutic Exercise: 8-22 mins                     12:42 PM, 07/04/18 Lonell Grandchild, MPT Physical Therapist with [facility] [phone] office [phone] mobile phone

## 2018-07-04 NOTE — Progress Notes (Signed)
Subjective: She says she feels okay.  Discussed nursing home placement again.  Objective: Vital signs in last 24 hours: Temp:  [98 F (36.7 C)-98.5 F (36.9 C)] 98.5 F (36.9 C) (06/30 0551) Pulse Rate:  [81-82] 81 (06/30 0551) Resp:  [18-20] 20 (06/30 0551) BP: (95-105)/(53-68) 105/68 (06/30 0551) SpO2:  [94 %-100 %] 97 % (06/30 0551) Weight:  [53 kg] 53 kg (06/30 0551) Weight change: -0.1 kg Last BM Date: 07/02/18  Intake/Output from previous day: 06/29 0701 - 06/30 0700 In: 600 [P.O.:600] Out: 700 [Urine:700]  PHYSICAL EXAM General appearance: alert, cooperative, no distress and Mildly confused Resp: clear to auscultation bilaterally Cardio: regular rate and rhythm, S1, S2 normal, no murmur, click, rub or gallop GI: soft, non-tender; bowel sounds normal; no masses,  no organomegaly Extremities: extremities normal, atraumatic, no cyanosis or edema  Lab Results:  Results for orders placed or performed during the hospital encounter of 06/29/18 (from the past 48 hour(s))  Creatinine, serum     Status: None   Collection Time: 07/03/18  4:57 AM  Result Value Ref Range   Creatinine, Ser 0.67 0.44 - 1.00 mg/dL   GFR calc non Af Amer >60 >60 mL/min   GFR calc Af Amer >60 >60 mL/min    Comment: Performed at [facility], [address], [place], [place] [address]  CBC with Differential/Platelet     Status: Abnormal   Collection Time: 07/04/18  4:26 AM  Result Value Ref Range   WBC 10.6 (H) 4.0 - 10.5 K/uL   RBC 3.36 (L) 3.87 - 5.11 MIL/uL   Hemoglobin 8.0 (L) 12.0 - 15.0 g/dL   HCT 27.1 (L) 36.0 - 46.0 %   MCV 80.7 80.0 - 100.0 fL   MCH 23.8 (L) 26.0 - 34.0 pg   MCHC 29.5 (L) 30.0 - 36.0 g/dL   RDW 14.4 11.5 - 15.5 %   Platelets 570 (H) 150 - 400 K/uL   nRBC 0.0 0.0 - 0.2 %   Neutrophils Relative % 67 %   Neutro Abs 7.2 1.7 - 7.7 K/uL   Lymphocytes Relative 14 %   Lymphs Abs 1.4 0.7 - 4.0 K/uL   Monocytes Relative 12 %   Monocytes Absolute 1.2 (H) 0.1 - 1.0 K/uL   Eosinophils Relative 6 %   Eosinophils Absolute 0.6 (H) 0.0 - 0.5 K/uL   Basophils Relative 0 %   Basophils Absolute 0.0 0.0 - 0.1 K/uL   Immature Granulocytes 1 %   Abs Immature Granulocytes 0.09 (H) 0.00 - 0.07 K/uL    Comment: Performed at [facility], [address] [place], [place] [address]  Basic metabolic panel     Status: Abnormal   Collection Time: 07/04/18  4:26 AM  Result Value Ref Range   Sodium 131 (L) 135 - 145 mmol/L   Potassium 4.5 3.5 - 5.1 mmol/L   Chloride 95 (L) 98 - 111 mmol/L   CO2 26 22 - 32 mmol/L   Glucose, Bld 97 70 - 99 mg/dL   BUN 21 8 - 23 mg/dL   Creatinine, Ser 0.66 0.44 - 1.00 mg/dL   Calcium 8.1 (L) 8.9 - 10.3 mg/dL   GFR calc non Af Amer >60 >60 mL/min   GFR calc Af Amer >60 >60 mL/min   Anion gap 10 5 - 15    Comment: Performed at [facility], [address], [place], [place] [address]    ABGS No results for input(s): PHART, PO2ART, TCO2, HCO3 in the last 72  hours.  Invalid input(s): PCO2 CULTURES Recent Results (from the past 240 hour(s))  SARS Coronavirus 2 (CEPHEID- Performed in [facility] hospital lab), Hosp Order     Status: None   Collection Time: 06/29/18  1:34 PM   Specimen: Nasopharyngeal Swab  Result Value Ref Range Status   SARS Coronavirus 2 NEGATIVE NEGATIVE Final    Comment: (NOTE) If result is NEGATIVE SARS-CoV-2 target nucleic acids are NOT DETECTED. The SARS-CoV-2 RNA is generally detectable in upper and lower  respiratory specimens during the acute phase of infection. The lowest  concentration of SARS-CoV-2 viral copies this assay can detect is 250  copies / mL. A negative result does not preclude SARS-CoV-2 infection  and should not be used as the sole basis for treatment or other  patient management decisions.  A negative result may occur with  improper specimen collection / handling, submission of specimen other  than nasopharyngeal swab, presence of viral mutation(s) within the  areas targeted by this assay, and  inadequate number of viral copies  (<250 copies / mL). A negative result must be combined with clinical  observations, patient history, and epidemiological information. If result is POSITIVE SARS-CoV-2 target nucleic acids are DETECTED. The SARS-CoV-2 RNA is generally detectable in upper and lower  respiratory specimens dur ing the acute phase of infection.  Positive  results are indicative of active infection with SARS-CoV-2.  Clinical  correlation with patient history and other diagnostic information is  necessary to determine patient infection status.  Positive results do  not rule out bacterial infection or co-infection with other viruses. If result is PRESUMPTIVE POSTIVE SARS-CoV-2 nucleic acids MAY BE PRESENT.   A presumptive positive result was obtained on the submitted specimen  and confirmed on repeat testing.  While 2019 novel coronavirus  (SARS-CoV-2) nucleic acids may be present in the submitted sample  additional confirmatory testing may be necessary for epidemiological  and / or clinical management purposes  to differentiate between  SARS-CoV-2 and other Sarbecovirus currently known to infect humans.  If clinically indicated additional testing with an alternate test  methodology [id number]) is advised. The SARS-CoV-2 RNA is generally  detectable in upper and lower respiratory sp ecimens during the acute  phase of infection. The expected result is Negative. Fact Sheet for Patients:  StrictlyIdeas.no Fact Sheet for Healthcare Providers: BankingDealers.co.za This test is not yet approved or cleared by the [place] FDA and has been authorized for detection and/or diagnosis of SARS-CoV-2 by FDA under an Emergency Use Authorization (EUA).  This EUA will remain in effect (meaning this test can be used) for the duration of the COVID-19 declaration under Section 564(b)(1) of the Act, 21 U.S.C. section 360bbb-3(b)(1), unless the  authorization is terminated or revoked sooner. Performed at [facility], [address], [place], [place] [address]   MRSA PCR Screening     Status: None   Collection Time: 06/30/18  5:48 AM   Specimen: Nasopharyngeal  Result Value Ref Range Status   MRSA by PCR NEGATIVE NEGATIVE Final    Comment:        The GeneXpert MRSA Assay (FDA approved for NASAL specimens only), is one component of a comprehensive MRSA colonization surveillance program. It is not intended to diagnose MRSA infection nor to guide or monitor treatment for MRSA infections. Performed at [facility], [address] [place], [place] [address]    Studies/Results: No results found.  Medications:  Prior to Admission:  Medications Prior to Admission  Medication Sig Dispense  Refill Last Dose  . albuterol (PROAIR HFA) 108 (90 Base) MCG/ACT inhaler Inhale 2 puffs into the lungs every 4 (four) hours as needed. 8 g 0 06/29/2018 at Unknown time  . albuterol (PROVENTIL) (2.5 MG/3ML) 0.083% nebulizer solution Take 3 mLs (2.5 mg total) by nebulization every 6 (six) hours as needed for wheezing or shortness of breath. 75 mL 0 06/29/2018 at Unknown time  . ALPRAZolam (XANAX) 0.5 MG tablet Take 1 tablet (0.5 mg total) by mouth 3 (three) times daily. 45 tablet 0 06/28/2018 at Unknown time  . [EXPIRED] amoxicillin-clavulanate (AUGMENTIN) 875-125 MG tablet Take 1 tablet by mouth 2 (two) times daily for 6 days. X 10 days 12 tablet 0 06/29/2018 at Unknown time  . cetirizine (ZYRTEC) 10 MG tablet Take 10 mg by mouth daily as needed for allergies.   06/29/2018 at Unknown time  . furosemide (LASIX) 40 MG tablet Take 1 tablet (40 mg total) by mouth daily. Beginning 06/26/18 30 tablet 0 06/29/2018 at Unknown time  . linaclotide (LINZESS) 145 MCG CAPS capsule Take 1 capsule (145 mcg total) by mouth daily as needed. 30 capsule 0 06/28/2018 at Unknown time  . mupirocin ointment (BACTROBAN) 2 % Apply 1 application topically 2 (two) times daily.     06/28/2018 at Unknown time  . potassium chloride (K-DUR) 10 MEQ tablet Take 1 tablet (10 mEq total) by mouth daily. 30 tablet 0 06/28/2018 at Unknown time  . Probiotic Product (RISA-BID PROBIOTIC) TABS Take 1 tablet by mouth 2 (two) times a day.   06/28/2018 at Unknown time  . tamsulosin (FLOMAX) 0.4 MG CAPS capsule Take 1 capsule (0.4 mg total) by mouth daily. 30 capsule 0 06/28/2018 at Unknown time  . [EXPIRED] traMADol (ULTRAM) 50 MG tablet Take 2 tablets (100 mg total) by mouth every 4 (four) hours as needed for up to 6 days. For 8-10/10 pain 14 tablet 0 06/29/2018 at Unknown time  . zolpidem (AMBIEN) 5 MG tablet Take 1 tablet (5 mg total) by mouth at bedtime as needed for up to 13 days for sleep. X 14 days 5 tablet 0 06/28/2018 at Unknown time  . Amino Acids-Protein Hydrolys (FEEDING SUPPLEMENT, PRO-STAT SUGAR FREE 64,) LIQD Take 30 mLs by mouth 3 (three) times daily with meals.   Not Taking at Unknown time  . furosemide (LASIX) 40 MG tablet Take 40 mg by mouth 2 (two) times daily.      . Memantine HCl ER (NAMENDA XR TITRATION PACK) 7 & 14 & 21 &28 MG CP24 Take 6-28 mg by mouth daily. 6/19 - 06/30/2018 = 7 mg daily 6/27 - 07/08/2018 = 14 mg daily 7/5 - 07/16/2018 - 21 mg 07/17/19 - Begin 28 mg daily (Patient not taking: Reported on 06/29/2018) 30 capsule 0 Not Taking at Unknown time  . NON FORMULARY Diet Type:  Mechanical soft,regular diet   Not Taking at Unknown time  . pantoprazole (PROTONIX) 40 MG tablet Take 1 tablet (40 mg total) by mouth 2 (two) times daily before a meal. (Patient not taking: Reported on 06/29/2018) 60 tablet 0 Not Taking at Unknown time  . umeclidinium-vilanterol (ANORO ELLIPTA) 62.5-25 MCG/INH AEPB Inhale 1 puff into the lungs daily. (Patient not taking: Reported on 06/29/2018) 60 each 0 Not Taking at Unknown time  . UNABLE TO FIND Silver Alginate - Clean wound left medial heel with N / S, apply sliver alginate and allevyn foam dressing,  Change daily and as needed   Not Taking at Unknown  time  .  Wound Dressings (ALLEVYN ADHESIVE EX) Allevyn Foam Dressing:   Apply to kyphotic bony prominence of spine and to lesion area right upper back and change every 3 days and as needed      Scheduled: . enoxaparin (LOVENOX) injection  40 mg Subcutaneous Q24H  . furosemide  20 mg Intravenous Q12H  . mupirocin ointment   Topical Daily  . potassium chloride  10 mEq Oral Daily  . torsemide  20 mg Oral BID   Continuous:  XJO:[id number] **OR** acetaminophen, albuterol, ALPRAZolam, linaclotide, ondansetron **OR** ondansetron (ZOFRAN) IV, polyethylene glycol, traMADol, zolpidem  Assesment: She was admitted with heart failure and pneumonia.  She is doing much better and is breathing well.  She has no new complaints.  She does have some skin lesions.  It has been recommended that she go to skilled care facility for rehab and that is being arranged.  She is on oral medications now. Active Problems:   Acute CHF (congestive heart failure) ([place])   Pneumonia    Plan: Discharge to skilled care facility when bed available    LOS: 4 days   Alonza Bogus 07/04/2018, 8:55 AM

## 2018-07-04 NOTE — Progress Notes (Signed)
Report called to [facility] and given to [place].

## 2018-07-04 NOTE — TOC Transition Note (Signed)
Transition of Care [facility]) - CM/SW Discharge Note   Patient Details  Name: [name] MRN: [id number] Date of Birth: [birth year]  [facility] CM/SW Contact:  Ihor Gully, LCSW Phone Number: 07/04/2018, 4:08 PM   Clinical Narrative:    Patient is discharging to [facility] for short term rehab. Her PASARR number was assigned today [phone]. Daughter notified of discharge. EMS transport arranged by Biomedical engineer.  LCSW signing off.    Final next level of care: Skilled Nursing Facility Barriers to Discharge: No Barriers Identified   Patient Goals and CMS Choice Patient states their goals for this hospitalization and ongoing recovery are:: Goal is for pt to discharge to SNF for short term rehab CMS Medicare.gov Compare Post Acute Care list provided to:: Other (Comment Required) Choice offered to / list presented to : Adult Children  Discharge Placement PASRR number recieved: 07/04/18([phone])            Patient chooses bed at: [facility] Patient to be transferred to facility by: [facility] Name of family member notified: daughter Patient and family notified of of transfer: 07/04/18  Discharge Plan and Services In-house Referral: Clinical Social Work   Post Acute Care Choice: [place]                               Social Determinants of Health (SDOH) Interventions     Readmission Risk Interventions Readmission Risk Prevention Plan 07/03/2018 06/30/2018 04/15/2018  Transportation Screening - Complete Complete  PCP or Specialist Appt within 5-7 Days - - Complete  Home Care Screening - - Complete  Medication Review (RN CM) - - Complete  Medication Review (RN Care Manager) Complete - -  PCP or Specialist appointment within 3-5 days of discharge Not Complete - -  PCP/Specialist Appt Not Complete comments Pt going to SNF rehab. SNF MD will follow. - -  HRI or Home Care Consult Not Complete Complete -   HRI or Home Care Consult Pt Refusal Comments Pt going to SNF rehab - -  SW Recovery Care/Counseling Consult - Complete -  Palliative Care Screening Not Applicable - -  Skilled Nursing Facility Complete - -  Some recent data might be hidden

## 2018-07-05 DIAGNOSIS — J449 Chronic obstructive pulmonary disease, unspecified: Secondary | ICD-10-CM | POA: Diagnosis not present

## 2018-07-05 DIAGNOSIS — F0390 Unspecified dementia without behavioral disturbance: Secondary | ICD-10-CM | POA: Diagnosis not present

## 2018-07-05 DIAGNOSIS — I509 Heart failure, unspecified: Secondary | ICD-10-CM | POA: Diagnosis not present

## 2018-07-05 DIAGNOSIS — M810 Age-related osteoporosis without current pathological fracture: Secondary | ICD-10-CM | POA: Diagnosis not present

## 2018-07-10 ENCOUNTER — Encounter: Payer: Self-pay | Admitting: Orthopedic Surgery

## 2018-07-10 ENCOUNTER — Other Ambulatory Visit: Payer: Self-pay

## 2018-07-10 ENCOUNTER — Ambulatory Visit (INDEPENDENT_AMBULATORY_CARE_PROVIDER_SITE_OTHER): Payer: Medicare Other | Admitting: Orthopedic Surgery

## 2018-07-10 ENCOUNTER — Ambulatory Visit (INDEPENDENT_AMBULATORY_CARE_PROVIDER_SITE_OTHER): Payer: Medicare Other

## 2018-07-10 VITALS — BP 99/59 | HR 98 | Temp 97.8°F | Ht 61.0 in | Wt 116.0 lb

## 2018-07-10 DIAGNOSIS — Z9889 Other specified postprocedural states: Secondary | ICD-10-CM

## 2018-07-10 DIAGNOSIS — Z8781 Personal history of (healed) traumatic fracture: Secondary | ICD-10-CM

## 2018-07-10 DIAGNOSIS — M7552 Bursitis of left shoulder: Secondary | ICD-10-CM | POA: Diagnosis not present

## 2018-07-10 NOTE — Progress Notes (Signed)
Patient ID: [name], female   DOB: [birth year], 84 y.o.   MRN: [id number]  FRACTURE CARE   Chief Complaint  Patient presents with  . Fracture    Rt hip fracture DOS 04/06/18    Encounter Diagnosis  Name Primary?  . S/P internal fixation right hip fracture cannulated screw placement 04/06/18 Yes    CURRENT TREATMENT : Later screws weight-bear as tolerated  POST INJURY DAY: 3 months post injury  [place] yes  New problem Chief complaint acute pain right shoulder  History 84 year old female status post open treatment internal fixation right hip with cannulated screws presents with 1 day history of right shoulder pain woke up this morning with pain in the shoulder no history of trauma.  Pain is located in the posterior lateral acromion and is present with lying on her left shoulder  Review of systems no tingling in the left arm  BP (!) 99/59   Pulse 98   Ht 5\' 1"  (1.549 m)   Wt 116 lb (52.6 kg)   BMI 21.92 kg/m   She is awake alert and oriented she is in a wheelchair but she gets gets out of the chair very easily  Both shoulders have limited range of motion with only 90 degrees of flexion in the scapular plane  Passive range of motion pain with abduction internal rotation no pain with abduction external rotation shoulder was stable  Neurovascular exam is intact  Hawkins impingement positive   Acute pain left shoulder possible bursitis  Left shoulder injection  Procedure note the subacromial injection shoulder left   Verbal consent was obtained to inject the  Left   Shoulder  Timeout was completed to confirm the injection site is a subacromial space of the  left  shoulder  Medication used Depo-Medrol 40 mg and lidocaine 1% 3 cc  Anesthesia was provided by ethyl chloride  The injection was performed in the left  posterior subacromial space. After pinning the skin with alcohol and anesthetized the skin with ethyl chloride the subacromial space was injected  using a 20-gauge needle. There were no complications  Sterile dressing was applied.   Follow as needed for the shoulder as well as the hip the hip fracture is healed  Encounter Diagnoses  Name Primary?  . S/P internal fixation right hip fracture cannulated screw placement 04/06/18 Yes  . Bursitis of left shoulder     .injs

## 2018-07-12 DIAGNOSIS — S30810D Abrasion of lower back and pelvis, subsequent encounter: Secondary | ICD-10-CM | POA: Diagnosis not present

## 2018-07-12 DIAGNOSIS — Z7409 Other reduced mobility: Secondary | ICD-10-CM | POA: Diagnosis not present

## 2018-07-12 DIAGNOSIS — L89620 Pressure ulcer of left heel, unstageable: Secondary | ICD-10-CM | POA: Diagnosis not present

## 2018-07-12 DIAGNOSIS — M6281 Muscle weakness (generalized): Secondary | ICD-10-CM | POA: Diagnosis not present

## 2018-07-13 DIAGNOSIS — I509 Heart failure, unspecified: Secondary | ICD-10-CM | POA: Diagnosis not present

## 2018-07-13 DIAGNOSIS — J449 Chronic obstructive pulmonary disease, unspecified: Secondary | ICD-10-CM | POA: Diagnosis not present

## 2018-07-13 DIAGNOSIS — J189 Pneumonia, unspecified organism: Secondary | ICD-10-CM | POA: Diagnosis not present

## 2018-07-13 DIAGNOSIS — S72001D Fracture of unspecified part of neck of right femur, subsequent encounter for closed fracture with routine healing: Secondary | ICD-10-CM | POA: Diagnosis not present

## 2018-07-17 DIAGNOSIS — M810 Age-related osteoporosis without current pathological fracture: Secondary | ICD-10-CM | POA: Diagnosis not present

## 2018-07-17 DIAGNOSIS — J189 Pneumonia, unspecified organism: Secondary | ICD-10-CM | POA: Diagnosis not present

## 2018-07-17 DIAGNOSIS — J449 Chronic obstructive pulmonary disease, unspecified: Secondary | ICD-10-CM | POA: Diagnosis not present

## 2018-07-17 DIAGNOSIS — D649 Anemia, unspecified: Secondary | ICD-10-CM | POA: Diagnosis not present

## 2018-07-19 DIAGNOSIS — L89620 Pressure ulcer of left heel, unstageable: Secondary | ICD-10-CM | POA: Diagnosis not present

## 2018-07-19 DIAGNOSIS — S30810D Abrasion of lower back and pelvis, subsequent encounter: Secondary | ICD-10-CM | POA: Diagnosis not present

## 2018-07-19 DIAGNOSIS — Z7409 Other reduced mobility: Secondary | ICD-10-CM | POA: Diagnosis not present

## 2018-07-19 DIAGNOSIS — M6281 Muscle weakness (generalized): Secondary | ICD-10-CM | POA: Diagnosis not present

## 2018-07-20 DIAGNOSIS — D649 Anemia, unspecified: Secondary | ICD-10-CM | POA: Diagnosis not present

## 2018-07-20 DIAGNOSIS — E871 Hypo-osmolality and hyponatremia: Secondary | ICD-10-CM | POA: Diagnosis not present

## 2018-07-20 DIAGNOSIS — J189 Pneumonia, unspecified organism: Secondary | ICD-10-CM | POA: Diagnosis not present

## 2018-07-20 DIAGNOSIS — E875 Hyperkalemia: Secondary | ICD-10-CM | POA: Diagnosis not present

## 2018-07-24 DIAGNOSIS — J449 Chronic obstructive pulmonary disease, unspecified: Secondary | ICD-10-CM | POA: Diagnosis not present

## 2018-07-24 DIAGNOSIS — I509 Heart failure, unspecified: Secondary | ICD-10-CM | POA: Diagnosis not present

## 2018-07-24 DIAGNOSIS — D649 Anemia, unspecified: Secondary | ICD-10-CM | POA: Diagnosis not present

## 2018-07-24 DIAGNOSIS — J189 Pneumonia, unspecified organism: Secondary | ICD-10-CM | POA: Diagnosis not present

## 2018-07-26 DIAGNOSIS — Z7409 Other reduced mobility: Secondary | ICD-10-CM | POA: Diagnosis not present

## 2018-07-26 DIAGNOSIS — L89620 Pressure ulcer of left heel, unstageable: Secondary | ICD-10-CM | POA: Diagnosis not present

## 2018-07-26 DIAGNOSIS — M6281 Muscle weakness (generalized): Secondary | ICD-10-CM | POA: Diagnosis not present

## 2018-07-26 DIAGNOSIS — S30810D Abrasion of lower back and pelvis, subsequent encounter: Secondary | ICD-10-CM | POA: Diagnosis not present

## 2018-07-27 DIAGNOSIS — F0150 Vascular dementia without behavioral disturbance: Secondary | ICD-10-CM | POA: Diagnosis not present

## 2018-07-27 DIAGNOSIS — L89623 Pressure ulcer of left heel, stage 3: Secondary | ICD-10-CM | POA: Diagnosis not present

## 2018-07-27 DIAGNOSIS — S72001D Fracture of unspecified part of neck of right femur, subsequent encounter for closed fracture with routine healing: Secondary | ICD-10-CM | POA: Diagnosis not present

## 2018-07-27 DIAGNOSIS — E43 Unspecified severe protein-calorie malnutrition: Secondary | ICD-10-CM | POA: Diagnosis not present

## 2018-07-27 DIAGNOSIS — Z4800 Encounter for change or removal of nonsurgical wound dressing: Secondary | ICD-10-CM | POA: Diagnosis not present

## 2018-07-27 DIAGNOSIS — J449 Chronic obstructive pulmonary disease, unspecified: Secondary | ICD-10-CM | POA: Diagnosis not present

## 2018-07-27 DIAGNOSIS — I5022 Chronic systolic (congestive) heart failure: Secondary | ICD-10-CM | POA: Diagnosis not present

## 2018-07-27 DIAGNOSIS — S20411D Abrasion of right back wall of thorax, subsequent encounter: Secondary | ICD-10-CM | POA: Diagnosis not present

## 2018-07-27 DIAGNOSIS — R1311 Dysphagia, oral phase: Secondary | ICD-10-CM | POA: Diagnosis not present

## 2018-07-27 DIAGNOSIS — M6281 Muscle weakness (generalized): Secondary | ICD-10-CM | POA: Diagnosis not present

## 2018-07-28 DIAGNOSIS — L89623 Pressure ulcer of left heel, stage 3: Secondary | ICD-10-CM | POA: Diagnosis not present

## 2018-07-28 DIAGNOSIS — F0150 Vascular dementia without behavioral disturbance: Secondary | ICD-10-CM | POA: Diagnosis not present

## 2018-07-28 DIAGNOSIS — S20411D Abrasion of right back wall of thorax, subsequent encounter: Secondary | ICD-10-CM | POA: Diagnosis not present

## 2018-07-28 DIAGNOSIS — Z4800 Encounter for change or removal of nonsurgical wound dressing: Secondary | ICD-10-CM | POA: Diagnosis not present

## 2018-07-28 DIAGNOSIS — J449 Chronic obstructive pulmonary disease, unspecified: Secondary | ICD-10-CM | POA: Diagnosis not present

## 2018-07-28 DIAGNOSIS — I5022 Chronic systolic (congestive) heart failure: Secondary | ICD-10-CM | POA: Diagnosis not present

## 2018-07-31 DIAGNOSIS — Z4800 Encounter for change or removal of nonsurgical wound dressing: Secondary | ICD-10-CM | POA: Diagnosis not present

## 2018-07-31 DIAGNOSIS — S20411D Abrasion of right back wall of thorax, subsequent encounter: Secondary | ICD-10-CM | POA: Diagnosis not present

## 2018-07-31 DIAGNOSIS — L89623 Pressure ulcer of left heel, stage 3: Secondary | ICD-10-CM | POA: Diagnosis not present

## 2018-07-31 DIAGNOSIS — I5022 Chronic systolic (congestive) heart failure: Secondary | ICD-10-CM | POA: Diagnosis not present

## 2018-07-31 DIAGNOSIS — J449 Chronic obstructive pulmonary disease, unspecified: Secondary | ICD-10-CM | POA: Diagnosis not present

## 2018-07-31 DIAGNOSIS — F0150 Vascular dementia without behavioral disturbance: Secondary | ICD-10-CM | POA: Diagnosis not present

## 2018-08-01 DIAGNOSIS — L89623 Pressure ulcer of left heel, stage 3: Secondary | ICD-10-CM | POA: Diagnosis not present

## 2018-08-01 DIAGNOSIS — J449 Chronic obstructive pulmonary disease, unspecified: Secondary | ICD-10-CM | POA: Diagnosis not present

## 2018-08-01 DIAGNOSIS — I5022 Chronic systolic (congestive) heart failure: Secondary | ICD-10-CM | POA: Diagnosis not present

## 2018-08-01 DIAGNOSIS — Z4800 Encounter for change or removal of nonsurgical wound dressing: Secondary | ICD-10-CM | POA: Diagnosis not present

## 2018-08-01 DIAGNOSIS — S20411D Abrasion of right back wall of thorax, subsequent encounter: Secondary | ICD-10-CM | POA: Diagnosis not present

## 2018-08-01 DIAGNOSIS — F0150 Vascular dementia without behavioral disturbance: Secondary | ICD-10-CM | POA: Diagnosis not present

## 2018-08-02 DIAGNOSIS — L89623 Pressure ulcer of left heel, stage 3: Secondary | ICD-10-CM | POA: Diagnosis not present

## 2018-08-02 DIAGNOSIS — S20411D Abrasion of right back wall of thorax, subsequent encounter: Secondary | ICD-10-CM | POA: Diagnosis not present

## 2018-08-02 DIAGNOSIS — J449 Chronic obstructive pulmonary disease, unspecified: Secondary | ICD-10-CM | POA: Diagnosis not present

## 2018-08-02 DIAGNOSIS — Z4800 Encounter for change or removal of nonsurgical wound dressing: Secondary | ICD-10-CM | POA: Diagnosis not present

## 2018-08-02 DIAGNOSIS — F0150 Vascular dementia without behavioral disturbance: Secondary | ICD-10-CM | POA: Diagnosis not present

## 2018-08-02 DIAGNOSIS — I5022 Chronic systolic (congestive) heart failure: Secondary | ICD-10-CM | POA: Diagnosis not present

## 2018-08-03 DIAGNOSIS — J449 Chronic obstructive pulmonary disease, unspecified: Secondary | ICD-10-CM | POA: Diagnosis not present

## 2018-08-03 DIAGNOSIS — L89623 Pressure ulcer of left heel, stage 3: Secondary | ICD-10-CM | POA: Diagnosis not present

## 2018-08-03 DIAGNOSIS — S20411D Abrasion of right back wall of thorax, subsequent encounter: Secondary | ICD-10-CM | POA: Diagnosis not present

## 2018-08-03 DIAGNOSIS — F0150 Vascular dementia without behavioral disturbance: Secondary | ICD-10-CM | POA: Diagnosis not present

## 2018-08-03 DIAGNOSIS — Z4800 Encounter for change or removal of nonsurgical wound dressing: Secondary | ICD-10-CM | POA: Diagnosis not present

## 2018-08-03 DIAGNOSIS — I5022 Chronic systolic (congestive) heart failure: Secondary | ICD-10-CM | POA: Diagnosis not present

## 2018-08-04 DIAGNOSIS — Z4800 Encounter for change or removal of nonsurgical wound dressing: Secondary | ICD-10-CM | POA: Diagnosis not present

## 2018-08-04 DIAGNOSIS — I5022 Chronic systolic (congestive) heart failure: Secondary | ICD-10-CM | POA: Diagnosis not present

## 2018-08-04 DIAGNOSIS — F0150 Vascular dementia without behavioral disturbance: Secondary | ICD-10-CM | POA: Diagnosis not present

## 2018-08-04 DIAGNOSIS — S20411D Abrasion of right back wall of thorax, subsequent encounter: Secondary | ICD-10-CM | POA: Diagnosis not present

## 2018-08-04 DIAGNOSIS — J449 Chronic obstructive pulmonary disease, unspecified: Secondary | ICD-10-CM | POA: Diagnosis not present

## 2018-08-04 DIAGNOSIS — L89623 Pressure ulcer of left heel, stage 3: Secondary | ICD-10-CM | POA: Diagnosis not present

## 2018-08-07 DIAGNOSIS — L8990 Pressure ulcer of unspecified site, unspecified stage: Secondary | ICD-10-CM | POA: Diagnosis not present

## 2018-08-07 DIAGNOSIS — J449 Chronic obstructive pulmonary disease, unspecified: Secondary | ICD-10-CM | POA: Diagnosis not present

## 2018-08-07 DIAGNOSIS — M810 Age-related osteoporosis without current pathological fracture: Secondary | ICD-10-CM | POA: Diagnosis not present

## 2018-08-07 DIAGNOSIS — I5032 Chronic diastolic (congestive) heart failure: Secondary | ICD-10-CM | POA: Diagnosis not present

## 2018-08-08 DIAGNOSIS — J449 Chronic obstructive pulmonary disease, unspecified: Secondary | ICD-10-CM | POA: Diagnosis not present

## 2018-08-08 DIAGNOSIS — I5022 Chronic systolic (congestive) heart failure: Secondary | ICD-10-CM | POA: Diagnosis not present

## 2018-08-08 DIAGNOSIS — Z4800 Encounter for change or removal of nonsurgical wound dressing: Secondary | ICD-10-CM | POA: Diagnosis not present

## 2018-08-08 DIAGNOSIS — F0150 Vascular dementia without behavioral disturbance: Secondary | ICD-10-CM | POA: Diagnosis not present

## 2018-08-08 DIAGNOSIS — L89623 Pressure ulcer of left heel, stage 3: Secondary | ICD-10-CM | POA: Diagnosis not present

## 2018-08-08 DIAGNOSIS — S20411D Abrasion of right back wall of thorax, subsequent encounter: Secondary | ICD-10-CM | POA: Diagnosis not present

## 2018-08-09 DIAGNOSIS — S20411D Abrasion of right back wall of thorax, subsequent encounter: Secondary | ICD-10-CM | POA: Diagnosis not present

## 2018-08-09 DIAGNOSIS — I5022 Chronic systolic (congestive) heart failure: Secondary | ICD-10-CM | POA: Diagnosis not present

## 2018-08-09 DIAGNOSIS — L89623 Pressure ulcer of left heel, stage 3: Secondary | ICD-10-CM | POA: Diagnosis not present

## 2018-08-09 DIAGNOSIS — J449 Chronic obstructive pulmonary disease, unspecified: Secondary | ICD-10-CM | POA: Diagnosis not present

## 2018-08-09 DIAGNOSIS — Z4800 Encounter for change or removal of nonsurgical wound dressing: Secondary | ICD-10-CM | POA: Diagnosis not present

## 2018-08-09 DIAGNOSIS — F0150 Vascular dementia without behavioral disturbance: Secondary | ICD-10-CM | POA: Diagnosis not present

## 2018-08-10 DIAGNOSIS — Z4800 Encounter for change or removal of nonsurgical wound dressing: Secondary | ICD-10-CM | POA: Diagnosis not present

## 2018-08-10 DIAGNOSIS — I5022 Chronic systolic (congestive) heart failure: Secondary | ICD-10-CM | POA: Diagnosis not present

## 2018-08-10 DIAGNOSIS — F0150 Vascular dementia without behavioral disturbance: Secondary | ICD-10-CM | POA: Diagnosis not present

## 2018-08-10 DIAGNOSIS — S20411D Abrasion of right back wall of thorax, subsequent encounter: Secondary | ICD-10-CM | POA: Diagnosis not present

## 2018-08-10 DIAGNOSIS — L89623 Pressure ulcer of left heel, stage 3: Secondary | ICD-10-CM | POA: Diagnosis not present

## 2018-08-10 DIAGNOSIS — J449 Chronic obstructive pulmonary disease, unspecified: Secondary | ICD-10-CM | POA: Diagnosis not present

## 2018-08-14 DIAGNOSIS — Z4800 Encounter for change or removal of nonsurgical wound dressing: Secondary | ICD-10-CM | POA: Diagnosis not present

## 2018-08-14 DIAGNOSIS — J449 Chronic obstructive pulmonary disease, unspecified: Secondary | ICD-10-CM | POA: Diagnosis not present

## 2018-08-14 DIAGNOSIS — S20411D Abrasion of right back wall of thorax, subsequent encounter: Secondary | ICD-10-CM | POA: Diagnosis not present

## 2018-08-14 DIAGNOSIS — F0150 Vascular dementia without behavioral disturbance: Secondary | ICD-10-CM | POA: Diagnosis not present

## 2018-08-14 DIAGNOSIS — L89623 Pressure ulcer of left heel, stage 3: Secondary | ICD-10-CM | POA: Diagnosis not present

## 2018-08-14 DIAGNOSIS — I5022 Chronic systolic (congestive) heart failure: Secondary | ICD-10-CM | POA: Diagnosis not present

## 2018-08-16 DIAGNOSIS — J449 Chronic obstructive pulmonary disease, unspecified: Secondary | ICD-10-CM | POA: Diagnosis not present

## 2018-08-16 DIAGNOSIS — S20411D Abrasion of right back wall of thorax, subsequent encounter: Secondary | ICD-10-CM | POA: Diagnosis not present

## 2018-08-16 DIAGNOSIS — L89623 Pressure ulcer of left heel, stage 3: Secondary | ICD-10-CM | POA: Diagnosis not present

## 2018-08-16 DIAGNOSIS — F0150 Vascular dementia without behavioral disturbance: Secondary | ICD-10-CM | POA: Diagnosis not present

## 2018-08-16 DIAGNOSIS — I5022 Chronic systolic (congestive) heart failure: Secondary | ICD-10-CM | POA: Diagnosis not present

## 2018-08-16 DIAGNOSIS — Z4800 Encounter for change or removal of nonsurgical wound dressing: Secondary | ICD-10-CM | POA: Diagnosis not present

## 2018-08-17 DIAGNOSIS — Z4800 Encounter for change or removal of nonsurgical wound dressing: Secondary | ICD-10-CM | POA: Diagnosis not present

## 2018-08-17 DIAGNOSIS — J449 Chronic obstructive pulmonary disease, unspecified: Secondary | ICD-10-CM | POA: Diagnosis not present

## 2018-08-17 DIAGNOSIS — I5022 Chronic systolic (congestive) heart failure: Secondary | ICD-10-CM | POA: Diagnosis not present

## 2018-08-17 DIAGNOSIS — F0150 Vascular dementia without behavioral disturbance: Secondary | ICD-10-CM | POA: Diagnosis not present

## 2018-08-17 DIAGNOSIS — L89623 Pressure ulcer of left heel, stage 3: Secondary | ICD-10-CM | POA: Diagnosis not present

## 2018-08-17 DIAGNOSIS — S20411D Abrasion of right back wall of thorax, subsequent encounter: Secondary | ICD-10-CM | POA: Diagnosis not present

## 2018-08-21 DIAGNOSIS — F0150 Vascular dementia without behavioral disturbance: Secondary | ICD-10-CM | POA: Diagnosis not present

## 2018-08-21 DIAGNOSIS — I5022 Chronic systolic (congestive) heart failure: Secondary | ICD-10-CM | POA: Diagnosis not present

## 2018-08-21 DIAGNOSIS — Z4800 Encounter for change or removal of nonsurgical wound dressing: Secondary | ICD-10-CM | POA: Diagnosis not present

## 2018-08-21 DIAGNOSIS — J449 Chronic obstructive pulmonary disease, unspecified: Secondary | ICD-10-CM | POA: Diagnosis not present

## 2018-08-21 DIAGNOSIS — L89623 Pressure ulcer of left heel, stage 3: Secondary | ICD-10-CM | POA: Diagnosis not present

## 2018-08-21 DIAGNOSIS — S20411D Abrasion of right back wall of thorax, subsequent encounter: Secondary | ICD-10-CM | POA: Diagnosis not present

## 2018-08-22 DIAGNOSIS — L89623 Pressure ulcer of left heel, stage 3: Secondary | ICD-10-CM | POA: Diagnosis not present

## 2018-08-22 DIAGNOSIS — S20411D Abrasion of right back wall of thorax, subsequent encounter: Secondary | ICD-10-CM | POA: Diagnosis not present

## 2018-08-22 DIAGNOSIS — F0150 Vascular dementia without behavioral disturbance: Secondary | ICD-10-CM | POA: Diagnosis not present

## 2018-08-22 DIAGNOSIS — I5022 Chronic systolic (congestive) heart failure: Secondary | ICD-10-CM | POA: Diagnosis not present

## 2018-08-22 DIAGNOSIS — J449 Chronic obstructive pulmonary disease, unspecified: Secondary | ICD-10-CM | POA: Diagnosis not present

## 2018-08-22 DIAGNOSIS — Z4800 Encounter for change or removal of nonsurgical wound dressing: Secondary | ICD-10-CM | POA: Diagnosis not present

## 2018-08-23 DIAGNOSIS — Z4800 Encounter for change or removal of nonsurgical wound dressing: Secondary | ICD-10-CM | POA: Diagnosis not present

## 2018-08-23 DIAGNOSIS — I5022 Chronic systolic (congestive) heart failure: Secondary | ICD-10-CM | POA: Diagnosis not present

## 2018-08-23 DIAGNOSIS — J449 Chronic obstructive pulmonary disease, unspecified: Secondary | ICD-10-CM | POA: Diagnosis not present

## 2018-08-23 DIAGNOSIS — L89623 Pressure ulcer of left heel, stage 3: Secondary | ICD-10-CM | POA: Diagnosis not present

## 2018-08-23 DIAGNOSIS — F0150 Vascular dementia without behavioral disturbance: Secondary | ICD-10-CM | POA: Diagnosis not present

## 2018-08-23 DIAGNOSIS — S20411D Abrasion of right back wall of thorax, subsequent encounter: Secondary | ICD-10-CM | POA: Diagnosis not present

## 2018-08-26 DIAGNOSIS — S72001D Fracture of unspecified part of neck of right femur, subsequent encounter for closed fracture with routine healing: Secondary | ICD-10-CM | POA: Diagnosis not present

## 2018-08-26 DIAGNOSIS — J449 Chronic obstructive pulmonary disease, unspecified: Secondary | ICD-10-CM | POA: Diagnosis not present

## 2018-08-26 DIAGNOSIS — I5022 Chronic systolic (congestive) heart failure: Secondary | ICD-10-CM | POA: Diagnosis not present

## 2018-08-26 DIAGNOSIS — R1311 Dysphagia, oral phase: Secondary | ICD-10-CM | POA: Diagnosis not present

## 2018-08-26 DIAGNOSIS — E43 Unspecified severe protein-calorie malnutrition: Secondary | ICD-10-CM | POA: Diagnosis not present

## 2018-08-26 DIAGNOSIS — S20411D Abrasion of right back wall of thorax, subsequent encounter: Secondary | ICD-10-CM | POA: Diagnosis not present

## 2018-08-26 DIAGNOSIS — L89623 Pressure ulcer of left heel, stage 3: Secondary | ICD-10-CM | POA: Diagnosis not present

## 2018-08-26 DIAGNOSIS — M6281 Muscle weakness (generalized): Secondary | ICD-10-CM | POA: Diagnosis not present

## 2018-08-26 DIAGNOSIS — Z4800 Encounter for change or removal of nonsurgical wound dressing: Secondary | ICD-10-CM | POA: Diagnosis not present

## 2018-08-26 DIAGNOSIS — F0150 Vascular dementia without behavioral disturbance: Secondary | ICD-10-CM | POA: Diagnosis not present

## 2018-08-28 DIAGNOSIS — S20411D Abrasion of right back wall of thorax, subsequent encounter: Secondary | ICD-10-CM | POA: Diagnosis not present

## 2018-08-28 DIAGNOSIS — F0150 Vascular dementia without behavioral disturbance: Secondary | ICD-10-CM | POA: Diagnosis not present

## 2018-08-28 DIAGNOSIS — I5022 Chronic systolic (congestive) heart failure: Secondary | ICD-10-CM | POA: Diagnosis not present

## 2018-08-28 DIAGNOSIS — L89623 Pressure ulcer of left heel, stage 3: Secondary | ICD-10-CM | POA: Diagnosis not present

## 2018-08-28 DIAGNOSIS — Z4800 Encounter for change or removal of nonsurgical wound dressing: Secondary | ICD-10-CM | POA: Diagnosis not present

## 2018-08-28 DIAGNOSIS — J449 Chronic obstructive pulmonary disease, unspecified: Secondary | ICD-10-CM | POA: Diagnosis not present

## 2018-08-29 DIAGNOSIS — Z4800 Encounter for change or removal of nonsurgical wound dressing: Secondary | ICD-10-CM | POA: Diagnosis not present

## 2018-08-29 DIAGNOSIS — I5022 Chronic systolic (congestive) heart failure: Secondary | ICD-10-CM | POA: Diagnosis not present

## 2018-08-29 DIAGNOSIS — F0150 Vascular dementia without behavioral disturbance: Secondary | ICD-10-CM | POA: Diagnosis not present

## 2018-08-29 DIAGNOSIS — J449 Chronic obstructive pulmonary disease, unspecified: Secondary | ICD-10-CM | POA: Diagnosis not present

## 2018-08-29 DIAGNOSIS — L89623 Pressure ulcer of left heel, stage 3: Secondary | ICD-10-CM | POA: Diagnosis not present

## 2018-08-29 DIAGNOSIS — S20411D Abrasion of right back wall of thorax, subsequent encounter: Secondary | ICD-10-CM | POA: Diagnosis not present

## 2018-08-31 DIAGNOSIS — J449 Chronic obstructive pulmonary disease, unspecified: Secondary | ICD-10-CM | POA: Diagnosis not present

## 2018-08-31 DIAGNOSIS — F0150 Vascular dementia without behavioral disturbance: Secondary | ICD-10-CM | POA: Diagnosis not present

## 2018-08-31 DIAGNOSIS — S20411D Abrasion of right back wall of thorax, subsequent encounter: Secondary | ICD-10-CM | POA: Diagnosis not present

## 2018-08-31 DIAGNOSIS — L89623 Pressure ulcer of left heel, stage 3: Secondary | ICD-10-CM | POA: Diagnosis not present

## 2018-08-31 DIAGNOSIS — Z4800 Encounter for change or removal of nonsurgical wound dressing: Secondary | ICD-10-CM | POA: Diagnosis not present

## 2018-08-31 DIAGNOSIS — I5022 Chronic systolic (congestive) heart failure: Secondary | ICD-10-CM | POA: Diagnosis not present

## 2018-09-05 DIAGNOSIS — J449 Chronic obstructive pulmonary disease, unspecified: Secondary | ICD-10-CM | POA: Diagnosis not present

## 2018-09-05 DIAGNOSIS — I5022 Chronic systolic (congestive) heart failure: Secondary | ICD-10-CM | POA: Diagnosis not present

## 2018-09-05 DIAGNOSIS — F0150 Vascular dementia without behavioral disturbance: Secondary | ICD-10-CM | POA: Diagnosis not present

## 2018-09-05 DIAGNOSIS — L89623 Pressure ulcer of left heel, stage 3: Secondary | ICD-10-CM | POA: Diagnosis not present

## 2018-09-05 DIAGNOSIS — Z4800 Encounter for change or removal of nonsurgical wound dressing: Secondary | ICD-10-CM | POA: Diagnosis not present

## 2018-09-05 DIAGNOSIS — S20411D Abrasion of right back wall of thorax, subsequent encounter: Secondary | ICD-10-CM | POA: Diagnosis not present

## 2018-09-09 DIAGNOSIS — L89623 Pressure ulcer of left heel, stage 3: Secondary | ICD-10-CM | POA: Diagnosis not present

## 2018-09-09 DIAGNOSIS — Z4800 Encounter for change or removal of nonsurgical wound dressing: Secondary | ICD-10-CM | POA: Diagnosis not present

## 2018-09-09 DIAGNOSIS — F0150 Vascular dementia without behavioral disturbance: Secondary | ICD-10-CM | POA: Diagnosis not present

## 2018-09-09 DIAGNOSIS — I5022 Chronic systolic (congestive) heart failure: Secondary | ICD-10-CM | POA: Diagnosis not present

## 2018-09-09 DIAGNOSIS — S20411D Abrasion of right back wall of thorax, subsequent encounter: Secondary | ICD-10-CM | POA: Diagnosis not present

## 2018-09-09 DIAGNOSIS — J449 Chronic obstructive pulmonary disease, unspecified: Secondary | ICD-10-CM | POA: Diagnosis not present

## 2018-09-14 DIAGNOSIS — F0150 Vascular dementia without behavioral disturbance: Secondary | ICD-10-CM | POA: Diagnosis not present

## 2018-09-14 DIAGNOSIS — J449 Chronic obstructive pulmonary disease, unspecified: Secondary | ICD-10-CM | POA: Diagnosis not present

## 2018-09-14 DIAGNOSIS — L89623 Pressure ulcer of left heel, stage 3: Secondary | ICD-10-CM | POA: Diagnosis not present

## 2018-09-14 DIAGNOSIS — N390 Urinary tract infection, site not specified: Secondary | ICD-10-CM | POA: Diagnosis not present

## 2018-09-14 DIAGNOSIS — I5022 Chronic systolic (congestive) heart failure: Secondary | ICD-10-CM | POA: Diagnosis not present

## 2018-09-14 DIAGNOSIS — Z4800 Encounter for change or removal of nonsurgical wound dressing: Secondary | ICD-10-CM | POA: Diagnosis not present

## 2018-09-14 DIAGNOSIS — S20411D Abrasion of right back wall of thorax, subsequent encounter: Secondary | ICD-10-CM | POA: Diagnosis not present

## 2018-09-18 DIAGNOSIS — Z23 Encounter for immunization: Secondary | ICD-10-CM | POA: Diagnosis not present

## 2018-09-18 DIAGNOSIS — I5032 Chronic diastolic (congestive) heart failure: Secondary | ICD-10-CM | POA: Diagnosis not present

## 2018-09-18 DIAGNOSIS — M5440 Lumbago with sciatica, unspecified side: Secondary | ICD-10-CM | POA: Diagnosis not present

## 2018-09-18 DIAGNOSIS — F419 Anxiety disorder, unspecified: Secondary | ICD-10-CM | POA: Diagnosis not present

## 2018-09-18 DIAGNOSIS — J449 Chronic obstructive pulmonary disease, unspecified: Secondary | ICD-10-CM | POA: Diagnosis not present

## 2018-09-25 DIAGNOSIS — J449 Chronic obstructive pulmonary disease, unspecified: Secondary | ICD-10-CM | POA: Diagnosis not present

## 2018-09-25 DIAGNOSIS — Z9181 History of falling: Secondary | ICD-10-CM | POA: Diagnosis not present

## 2018-09-25 DIAGNOSIS — K589 Irritable bowel syndrome without diarrhea: Secondary | ICD-10-CM | POA: Diagnosis not present

## 2018-09-25 DIAGNOSIS — I5022 Chronic systolic (congestive) heart failure: Secondary | ICD-10-CM | POA: Diagnosis not present

## 2018-09-25 DIAGNOSIS — K219 Gastro-esophageal reflux disease without esophagitis: Secondary | ICD-10-CM | POA: Diagnosis not present

## 2018-09-25 DIAGNOSIS — G4700 Insomnia, unspecified: Secondary | ICD-10-CM | POA: Diagnosis not present

## 2018-09-25 DIAGNOSIS — L89623 Pressure ulcer of left heel, stage 3: Secondary | ICD-10-CM | POA: Diagnosis not present

## 2018-09-25 DIAGNOSIS — F0150 Vascular dementia without behavioral disturbance: Secondary | ICD-10-CM | POA: Diagnosis not present

## 2018-09-25 DIAGNOSIS — Z7722 Contact with and (suspected) exposure to environmental tobacco smoke (acute) (chronic): Secondary | ICD-10-CM | POA: Diagnosis not present

## 2018-09-25 DIAGNOSIS — F329 Major depressive disorder, single episode, unspecified: Secondary | ICD-10-CM | POA: Diagnosis not present

## 2018-09-25 DIAGNOSIS — F419 Anxiety disorder, unspecified: Secondary | ICD-10-CM | POA: Diagnosis not present

## 2018-09-25 DIAGNOSIS — R339 Retention of urine, unspecified: Secondary | ICD-10-CM | POA: Diagnosis not present

## 2018-09-25 DIAGNOSIS — Z8701 Personal history of pneumonia (recurrent): Secondary | ICD-10-CM | POA: Diagnosis not present

## 2018-09-25 DIAGNOSIS — M810 Age-related osteoporosis without current pathological fracture: Secondary | ICD-10-CM | POA: Diagnosis not present

## 2018-09-25 DIAGNOSIS — N390 Urinary tract infection, site not specified: Secondary | ICD-10-CM | POA: Diagnosis not present

## 2018-09-28 DIAGNOSIS — R0602 Shortness of breath: Secondary | ICD-10-CM | POA: Diagnosis not present

## 2018-09-29 ENCOUNTER — Inpatient Hospital Stay (HOSPITAL_COMMUNITY)
Admission: EM | Admit: 2018-09-29 | Discharge: 2018-10-02 | DRG: 291 | Disposition: A | Discharge status: Home Health | Payer: Medicare Other | Attending: Pulmonary Disease | Admitting: Pulmonary Disease

## 2018-09-29 ENCOUNTER — Other Ambulatory Visit: Payer: Self-pay

## 2018-09-29 ENCOUNTER — Encounter (HOSPITAL_COMMUNITY): Payer: Self-pay | Admitting: *Deleted

## 2018-09-29 ENCOUNTER — Emergency Department (HOSPITAL_COMMUNITY): Payer: Medicare Other

## 2018-09-29 DIAGNOSIS — Z833 Family history of diabetes mellitus: Secondary | ICD-10-CM

## 2018-09-29 DIAGNOSIS — E43 Unspecified severe protein-calorie malnutrition: Secondary | ICD-10-CM | POA: Diagnosis present

## 2018-09-29 DIAGNOSIS — J440 Chronic obstructive pulmonary disease with acute lower respiratory infection: Secondary | ICD-10-CM | POA: Diagnosis present

## 2018-09-29 DIAGNOSIS — R05 Cough: Secondary | ICD-10-CM | POA: Diagnosis not present

## 2018-09-29 DIAGNOSIS — I5031 Acute diastolic (congestive) heart failure: Secondary | ICD-10-CM | POA: Diagnosis present

## 2018-09-29 DIAGNOSIS — Z79899 Other long term (current) drug therapy: Secondary | ICD-10-CM

## 2018-09-29 DIAGNOSIS — D649 Anemia, unspecified: Secondary | ICD-10-CM | POA: Diagnosis present

## 2018-09-29 DIAGNOSIS — F419 Anxiety disorder, unspecified: Secondary | ICD-10-CM | POA: Diagnosis present

## 2018-09-29 DIAGNOSIS — Z8711 Personal history of peptic ulcer disease: Secondary | ICD-10-CM

## 2018-09-29 DIAGNOSIS — Z20828 Contact with and (suspected) exposure to other viral communicable diseases: Secondary | ICD-10-CM | POA: Diagnosis not present

## 2018-09-29 DIAGNOSIS — Z881 Allergy status to other antibiotic agents status: Secondary | ICD-10-CM

## 2018-09-29 DIAGNOSIS — I5033 Acute on chronic diastolic (congestive) heart failure: Principal | ICD-10-CM | POA: Diagnosis present

## 2018-09-29 DIAGNOSIS — Z681 Body mass index (BMI) 19 or less, adult: Secondary | ICD-10-CM

## 2018-09-29 DIAGNOSIS — I509 Heart failure, unspecified: Secondary | ICD-10-CM

## 2018-09-29 DIAGNOSIS — J189 Pneumonia, unspecified organism: Secondary | ICD-10-CM | POA: Diagnosis not present

## 2018-09-29 DIAGNOSIS — Z823 Family history of stroke: Secondary | ICD-10-CM

## 2018-09-29 DIAGNOSIS — K581 Irritable bowel syndrome with constipation: Secondary | ICD-10-CM | POA: Diagnosis present

## 2018-09-29 DIAGNOSIS — Z8781 Personal history of (healed) traumatic fracture: Secondary | ICD-10-CM

## 2018-09-29 DIAGNOSIS — E785 Hyperlipidemia, unspecified: Secondary | ICD-10-CM | POA: Diagnosis present

## 2018-09-29 DIAGNOSIS — J449 Chronic obstructive pulmonary disease, unspecified: Secondary | ICD-10-CM | POA: Diagnosis present

## 2018-09-29 DIAGNOSIS — J9601 Acute respiratory failure with hypoxia: Secondary | ICD-10-CM | POA: Diagnosis present

## 2018-09-29 DIAGNOSIS — M810 Age-related osteoporosis without current pathological fracture: Secondary | ICD-10-CM | POA: Diagnosis present

## 2018-09-29 DIAGNOSIS — K219 Gastro-esophageal reflux disease without esophagitis: Secondary | ICD-10-CM | POA: Diagnosis present

## 2018-09-29 DIAGNOSIS — J441 Chronic obstructive pulmonary disease with (acute) exacerbation: Secondary | ICD-10-CM | POA: Diagnosis not present

## 2018-09-29 DIAGNOSIS — H9190 Unspecified hearing loss, unspecified ear: Secondary | ICD-10-CM | POA: Diagnosis present

## 2018-09-29 DIAGNOSIS — R069 Unspecified abnormalities of breathing: Secondary | ICD-10-CM | POA: Diagnosis not present

## 2018-09-29 DIAGNOSIS — R0602 Shortness of breath: Secondary | ICD-10-CM | POA: Diagnosis not present

## 2018-09-29 DIAGNOSIS — Z8249 Family history of ischemic heart disease and other diseases of the circulatory system: Secondary | ICD-10-CM

## 2018-09-29 DIAGNOSIS — R0600 Dyspnea, unspecified: Secondary | ICD-10-CM | POA: Diagnosis not present

## 2018-09-29 DIAGNOSIS — Z7722 Contact with and (suspected) exposure to environmental tobacco smoke (acute) (chronic): Secondary | ICD-10-CM | POA: Diagnosis present

## 2018-09-29 DIAGNOSIS — R339 Retention of urine, unspecified: Secondary | ICD-10-CM | POA: Diagnosis present

## 2018-09-29 DIAGNOSIS — Z9889 Other specified postprocedural states: Secondary | ICD-10-CM

## 2018-09-29 DIAGNOSIS — Z882 Allergy status to sulfonamides status: Secondary | ICD-10-CM

## 2018-09-29 DIAGNOSIS — Z885 Allergy status to narcotic agent status: Secondary | ICD-10-CM

## 2018-09-29 LAB — BASIC METABOLIC PANEL
Anion gap: 15 (ref 5–15)
BUN: 23 mg/dL (ref 8–23)
CO2: 19 mmol/L — ABNORMAL LOW (ref 22–32)
Calcium: 9 mg/dL (ref 8.9–10.3)
Chloride: 105 mmol/L (ref 98–111)
Creatinine, Ser: 0.84 mg/dL (ref 0.44–1.00)
GFR calc Af Amer: >60 mL/min (ref >60)
GFR calc non Af Amer: >60 mL/min (ref >60)
Glucose, Bld: 85 mg/dL (ref 70–99)
Potassium: 4.3 mmol/L (ref 3.5–5.1)
Sodium: 139 mmol/L (ref 135–145)

## 2018-09-29 LAB — HEPATIC FUNCTION PANEL
ALT: 12 U/L (ref 0–44)
AST: 22 U/L (ref 15–41)
Albumin: 2.6 g/dL — ABNORMAL LOW (ref 3.5–5.0)
Alkaline Phosphatase: 86 U/L (ref 38–126)
Bilirubin, Direct: <0.1 mg/dL (ref 0.0–0.2)
Total Bilirubin: 0.4 mg/dL (ref 0.3–1.2)
Total Protein: 7.2 g/dL (ref 6.5–8.1)

## 2018-09-29 LAB — CBC
HCT: 31.5 % — ABNORMAL LOW (ref 36.0–46.0)
Hemoglobin: 9 g/dL — ABNORMAL LOW (ref 12.0–15.0)
MCH: 23 pg — ABNORMAL LOW (ref 26.0–34.0)
MCHC: 28.6 g/dL — ABNORMAL LOW (ref 30.0–36.0)
MCV: 80.4 fL (ref 80.0–100.0)
Platelets: 556 10*3/uL — ABNORMAL HIGH (ref 150–400)
RBC: 3.92 MIL/uL (ref 3.87–5.11)
RDW: 16.2 % — ABNORMAL HIGH (ref 11.5–15.5)
WBC: 10.9 10*3/uL — ABNORMAL HIGH (ref 4.0–10.5)
nRBC: 0 % (ref 0.0–0.2)

## 2018-09-29 LAB — TROPONIN I (HIGH SENSITIVITY)
Troponin I (High Sensitivity): 7 ng/L (ref <18)
Troponin I (High Sensitivity): 7 ng/L (ref <18)

## 2018-09-29 LAB — BRAIN NATRIURETIC PEPTIDE: B Natriuretic Peptide: 138 pg/mL — ABNORMAL HIGH (ref 0.0–100.0)

## 2018-09-29 LAB — SARS CORONAVIRUS 2 BY RT PCR (HOSPITAL ORDER, PERFORMED IN ~~LOC~~ HOSPITAL LAB): SARS Coronavirus 2: NEGATIVE

## 2018-09-29 MED ORDER — ALBUTEROL SULFATE HFA 108 (90 BASE) MCG/ACT IN AERS
4.0000 | INHALATION_SPRAY | RESPIRATORY_TRACT | Status: DC | PRN
  Administered 2018-09-30: 01:00:00 4 via RESPIRATORY_TRACT
  Filled 2018-09-29: qty 6.7

## 2018-09-29 MED ORDER — FUROSEMIDE 10 MG/ML IJ SOLN
20.0000 mg | Freq: Once | INTRAMUSCULAR | Status: AC
  Administered 2018-09-29: 20 mg via INTRAVENOUS
  Filled 2018-09-29: qty 2

## 2018-09-29 MED ORDER — MAGNESIUM SULFATE 2 GM/50ML IV SOLN
2.0000 g | Freq: Once | INTRAVENOUS | Status: AC
  Administered 2018-09-29: 2 g via INTRAVENOUS
  Filled 2018-09-29: qty 50

## 2018-09-29 MED ORDER — METHYLPREDNISOLONE SODIUM SUCC 125 MG IJ SOLR
125.0000 mg | Freq: Once | INTRAMUSCULAR | Status: AC
  Administered 2018-09-29: 125 mg via INTRAVENOUS
  Filled 2018-09-29: qty 2

## 2018-09-29 NOTE — ED Provider Notes (Signed)
[facility] EMERGENCY DEPARTMENT Provider Note   CSN: [id number] Arrival date & time: 09/29/18  1642     History   Chief Complaint Chief Complaint  Patient presents with  . Shortness of Breath    HPI ONEDIA NIEMEIER is a 84 y.o. female.     Patient complains of shortness of breath.  Patient's daughter states that the patient's oxygen saturation drops down to 88 when she walks.  Patient has a history of COPD and congestive heart failure  The history is provided by the patient, medical records and a relative. No language interpreter was used.  Shortness of Breath Severity:  Moderate Onset quality:  Sudden Timing:  Constant Chronicity:  Recurrent Context: activity   Relieved by:  Nothing Worsened by:  Nothing Ineffective treatments:  None tried Associated symptoms: no abdominal pain, no chest pain, no cough, no headaches and no rash     Past Medical History:  Diagnosis Date  . Anxiety   . CHF (congestive heart failure) ([place])   . COPD (chronic obstructive pulmonary disease) ([place])   . GERD (gastroesophageal reflux disease)   . Hyperlipidemia   . IBS (irritable bowel syndrome)   . Osteoporosis     Patient Active Problem List   Diagnosis Date Noted  . Pneumonia 06/30/2018  . Acute CHF (congestive heart failure) ([place]) 06/29/2018  . Vascular dementia without behavioral disturbance ([place]) 06/26/2018  . CAP (community acquired pneumonia) 06/23/2018  . Bilateral pleural effusion 06/23/2018  . Leukocytosis 06/20/2018  . Chronic anemia 06/20/2018  . Cognitive impairment 06/19/2018  . Bilateral lower extremity edema 06/18/2018  . Protein-calorie malnutrition, severe ([place]) 06/18/2018  . S/P internal fixation right hip fracture cannulated screw placement 04/06/18 04/27/2018  . Urinary tract infection associated with catheterization of urinary tract ([place]) 04/11/2018  . Altered mental status 04/10/2018  . Anxiety 04/10/2018  . Hyponatremia 04/10/2018  . UTI (urinary tract  infection) due to urinary indwelling catheter ([place]) 04/10/2018  . Chronic constipation 04/09/2018  . Urinary retention 04/09/2018  . Closed right hip fracture ([place]) 04/05/2018  . GERD (gastroesophageal reflux disease) 04/05/2018  . COPD (chronic obstructive pulmonary disease) ([place]) 04/05/2018  . Hiatal hernia   . Dyspepsia 04/04/2014  . Hematochezia 04/30/2011  . RUQ pain 03/25/2011  . Adenomatous polyps 03/25/2011  . ABDOMINAL PAIN, LEFT LOWER QUADRANT 10/16/2009  . HYPERLIPIDEMIA 10/09/2009  . BRONCHITIS 10/09/2009  . Osteoporosis 10/09/2009    Past Surgical History:  Procedure Laterality Date  . COLONOSCOPY  10/2009   sigmoid diverticula, multiple tubulovillous adneomas, needs surveillance Oct 2013  . ESOPHAGOGASTRODUODENOSCOPY  04/16/2011   Dr. Gala Romney: Noncritical Schatzkis ring( not manipulated because no dysphagia). Normal esophagus otherwise  large hiatal hernia. Gastric polyp-status post biopsy. Gastric erosions-staus post biopsy. Abnormal bulb-status post biopsy. benign small bowel, stomach biopsy with ulcerated gastric antral mucosa with foveolar hyperplasia and surface erosion, polyp with inflamed gastric antral mucosa   . ESOPHAGOGASTRODUODENOSCOPY N/A 04/29/2014   Dr. Gala Romney: Noncritical Schatzki's ring large hiatal hernia. Retained gastric contents.GES with slight delay  . HIP PINNING,CANNULATED Right 04/06/2018   Procedure: CANNULATED HIP PINNING;  Surgeon: Carole Civil, MD;  Location: [facility];  Service: Orthopedics;  Laterality: Right;     OB History    Gravida  3   Para  3   Term  3   Preterm  0   AB  0   Living        SAB  0   TAB  0   Ectopic  0   Multiple      Live Births               Home Medications    Prior to Admission medications   Medication Sig Start Date End Date Taking? Authorizing Provider  albuterol (PROAIR HFA) 108 (90 Base) MCG/ACT inhaler Inhale 2 puffs into the lungs every 4 (four) hours as needed. 06/23/18   Gerlene Fee, NP  albuterol (PROVENTIL) (2.5 MG/3ML) 0.083% nebulizer solution Take 3 mLs (2.5 mg total) by nebulization every 6 (six) hours as needed for wheezing or shortness of breath. 06/23/18   Gerlene Fee, NP  ALPRAZolam Duanne Moron) 0.5 MG tablet Take 1 tablet (0.5 mg total) by mouth 3 (three) times daily. 06/23/18   Gerlene Fee, NP  Amino Acids-Protein Hydrolys (FEEDING SUPPLEMENT, PRO-STAT SUGAR FREE 64,) LIQD Take 30 mLs by mouth 3 (three) times daily with meals. 06/16/18   [provider]  cetirizine (ZYRTEC) 10 MG tablet Take 10 mg by mouth daily as needed for allergies. 06/16/18   [provider]  linaclotide (LINZESS) 145 MCG CAPS capsule Take 1 capsule (145 mcg total) by mouth daily as needed. 06/23/18   Gerlene Fee, NP  mupirocin ointment (BACTROBAN) 2 % Apply 1 application topically 2 (two) times daily.  05/22/18   [provider]  NON FORMULARY Diet Type:  Mechanical soft,regular diet 06/16/18   [provider]  potassium chloride (K-DUR) 10 MEQ tablet Take 1 tablet (10 mEq total) by mouth daily. 06/23/18   Gerlene Fee, NP  tamsulosin (FLOMAX) 0.4 MG CAPS capsule Take 1 capsule (0.4 mg total) by mouth daily. 06/23/18   Gerlene Fee, NP  torsemide (DEMADEX) 20 MG tablet Take 1 tablet (20 mg total) by mouth 2 (two) times daily. 07/04/18   [name], MD  traMADol (ULTRAM) 50 MG tablet Take 1 tablet (50 mg total) by mouth every 6 (six) hours as needed (pain). 07/04/18   [name], MD  umeclidinium-vilanterol (ANORO ELLIPTA) 62.5-25 MCG/INH AEPB Inhale 1 puff into the lungs daily. Patient not taking: Reported on 06/29/2018 06/23/18   Gerlene Fee, NP  UNABLE TO FIND Silver Alginate - Clean wound left medial heel with N / S, apply sliver alginate and allevyn foam dressing,  Change daily and as needed 06/16/18   [provider]  Wound Dressings (ALLEVYN ADHESIVE EX) Allevyn Foam Dressing:   Apply to kyphotic bony prominence of  spine and to lesion area right upper back and change every 3 days and as needed 06/16/18   [provider]  zolpidem (AMBIEN) 5 MG tablet Take 1 tablet (5 mg total) by mouth at bedtime as needed for up to 13 days for sleep. X 14 days 06/23/18 07/06/18  Gerlene Fee, NP    Family History Family History  Problem Relation Age of Onset  . Stroke Mother   . Diabetes Mother   . Heart disease Father   . Colon cancer Neg Hx     Social History Social History   Tobacco Use  . Smoking status: Passive Smoke Exposure - Never Smoker  . Smokeless tobacco: Current User    Types: Snuff  Substance Use Topics  . Alcohol use: No  . Drug use: No     Allergies   Codeine, Codeine, Levofloxacin, Sulfonamide derivatives, and Sulfa antibiotics   Review of Systems Review of Systems  Constitutional: Negative for appetite change and fatigue.  HENT: Negative for congestion, ear discharge and  sinus pressure.   Eyes: Negative for discharge.  Respiratory: Positive for shortness of breath. Negative for cough.   Cardiovascular: Negative for chest pain.  Gastrointestinal: Negative for abdominal pain and diarrhea.  Genitourinary: Negative for frequency and hematuria.  Musculoskeletal: Negative for back pain.  Skin: Negative for rash.  Neurological: Negative for seizures and headaches.  Psychiatric/Behavioral: Negative for hallucinations.     Physical Exam Updated Vital Signs BP 118/73   Pulse 96   Temp 97.9 F (36.6 C) (Oral)   Resp 18   Ht 5\' 7"  (1.702 m)   Wt 54.4 kg   SpO2 94%   BMI 18.79 kg/m   Physical Exam Vitals signs and nursing note reviewed.  Constitutional:      Appearance: She is well-developed.  HENT:     Head: Normocephalic.     Nose: Nose normal.  Eyes:     General: No scleral icterus.    Conjunctiva/sclera: Conjunctivae normal.  Neck:     Musculoskeletal: Neck supple.     Thyroid: No thyromegaly.  Cardiovascular:     Rate and Rhythm: Normal rate and  regular rhythm.     Heart sounds: No murmur. No friction rub. No gallop.   Pulmonary:     Breath sounds: No stridor. No wheezing or rales.  Chest:     Chest wall: No tenderness.  Abdominal:     General: There is no distension.     Tenderness: There is no abdominal tenderness. There is no rebound.  Musculoskeletal: Normal range of motion.  Lymphadenopathy:     Cervical: No cervical adenopathy.  Skin:    Findings: No erythema or rash.  Neurological:     Mental Status: She is alert and oriented to person, place, and time.     Motor: No abnormal muscle tone.     Coordination: Coordination normal.  Psychiatric:        Behavior: Behavior normal.      ED Treatments / Results  Labs (all labs ordered are listed, but only abnormal results are displayed) Labs Reviewed  BASIC METABOLIC PANEL - Abnormal; Notable for the following components:      Result Value   CO2 19 (*)    All other components within normal limits  CBC - Abnormal; Notable for the following components:   WBC 10.9 (*)    Hemoglobin 9.0 (*)    HCT 31.5 (*)    MCH 23.0 (*)    MCHC 28.6 (*)    RDW 16.2 (*)    Platelets 556 (*)    All other components within normal limits  HEPATIC FUNCTION PANEL - Abnormal; Notable for the following components:   Albumin 2.6 (*)    All other components within normal limits  BRAIN NATRIURETIC PEPTIDE - Abnormal; Notable for the following components:   B Natriuretic Peptide 138.0 (*)    All other components within normal limits  SARS CORONAVIRUS 2 (HOSPITAL ORDER, [facility])  TROPONIN I (HIGH SENSITIVITY)  TROPONIN I (HIGH SENSITIVITY)    EKG EKG Interpretation  Date/Time:  Friday September 29 2018 17:19:59 EDT Ventricular Rate:  98 PR Interval:  200 QRS Duration: 92 QT Interval:  360 QTC Calculation: 459 R Axis:   57 Text Interpretation:  Normal sinus rhythm Normal ECG Confirmed by Milton Ferguson [phone]) on 09/29/2018 10:16:04 PM   Radiology  Dg Chest Portable 1 View  Result Date: 09/29/2018 CLINICAL DATA:  Shortness of breath EXAM: PORTABLE CHEST 1 VIEW COMPARISON:  06/29/2018  FINDINGS: Cardiomegaly. Diffuse interstitial and alveolar airspace opacities are again noted. Findings similar to prior study. Possible small effusions. No acute bony abnormality. IMPRESSION: Cardiomegaly with diffuse bilateral interstitial and airspace opacities, favor edema/CHF. Suspect small layering effusions. Electronically Signed   By: Rolm Baptise M.D.   On: 09/29/2018 20:48    Procedures Procedures (including critical care time)  Medications Ordered in ED Medications  albuterol (VENTOLIN HFA) 108 (90 Base) MCG/ACT inhaler 4 puff (has no administration in time range)  furosemide (LASIX) injection 20 mg (has no administration in time range)  methylPREDNISolone sodium succinate (SOLU-MEDROL) 125 mg/2 mL injection 125 mg (125 mg Intravenous Given 09/29/18 2019)  magnesium sulfate IVPB 2 g 50 mL (2 g Intravenous New Bag/Given 09/29/18 2019)     Initial Impression / Assessment and Plan / ED Course  I have reviewed the triage vital signs and the nursing notes.  Pertinent labs & imaging results that were available during my care of the patient were reviewed by me and considered in my medical decision making (see chart for details).        Chest x-ray suggest congestive heart failure.  Patient will be given some Lasix and admitted to medicine  Final Clinical Impressions(s) / ED Diagnoses   Final diagnoses:  None    ED Discharge Orders    None       Milton Ferguson, MD 09/29/18 2223

## 2018-09-29 NOTE — ED Notes (Signed)
Family in room  

## 2018-09-29 NOTE — ED Notes (Addendum)
Pt eating [employer] brought by family

## 2018-09-29 NOTE — ED Triage Notes (Signed)
Pt's family member reports pt starting having SOB, cough, dyspnea with exertion, confusion x 4 days. Pt had portable CXR done yesterday at home and was told by home health nurse that the doctor said it possibly showed PNA. O2 sats drop to 88% on RA when ambulating at home per daughter. Pt was also admitted recently for E. Coli in her urine and has been on 2 different antibiotics, finished them yesterday.

## 2018-09-30 ENCOUNTER — Inpatient Hospital Stay (HOSPITAL_COMMUNITY): Payer: Medicare Other

## 2018-09-30 DIAGNOSIS — Z823 Family history of stroke: Secondary | ICD-10-CM | POA: Diagnosis not present

## 2018-09-30 DIAGNOSIS — R339 Retention of urine, unspecified: Secondary | ICD-10-CM | POA: Diagnosis present

## 2018-09-30 DIAGNOSIS — E43 Unspecified severe protein-calorie malnutrition: Secondary | ICD-10-CM | POA: Diagnosis present

## 2018-09-30 DIAGNOSIS — I5031 Acute diastolic (congestive) heart failure: Secondary | ICD-10-CM | POA: Diagnosis present

## 2018-09-30 DIAGNOSIS — I5033 Acute on chronic diastolic (congestive) heart failure: Secondary | ICD-10-CM | POA: Diagnosis present

## 2018-09-30 DIAGNOSIS — Z681 Body mass index (BMI) 19 or less, adult: Secondary | ICD-10-CM | POA: Diagnosis not present

## 2018-09-30 DIAGNOSIS — I361 Nonrheumatic tricuspid (valve) insufficiency: Secondary | ICD-10-CM

## 2018-09-30 DIAGNOSIS — I509 Heart failure, unspecified: Secondary | ICD-10-CM | POA: Diagnosis not present

## 2018-09-30 DIAGNOSIS — J441 Chronic obstructive pulmonary disease with (acute) exacerbation: Secondary | ICD-10-CM | POA: Diagnosis present

## 2018-09-30 DIAGNOSIS — H9190 Unspecified hearing loss, unspecified ear: Secondary | ICD-10-CM | POA: Diagnosis present

## 2018-09-30 DIAGNOSIS — J9601 Acute respiratory failure with hypoxia: Secondary | ICD-10-CM | POA: Diagnosis present

## 2018-09-30 DIAGNOSIS — Z79899 Other long term (current) drug therapy: Secondary | ICD-10-CM | POA: Diagnosis not present

## 2018-09-30 DIAGNOSIS — Z885 Allergy status to narcotic agent status: Secondary | ICD-10-CM | POA: Diagnosis not present

## 2018-09-30 DIAGNOSIS — J449 Chronic obstructive pulmonary disease, unspecified: Secondary | ICD-10-CM | POA: Diagnosis not present

## 2018-09-30 DIAGNOSIS — Z20828 Contact with and (suspected) exposure to other viral communicable diseases: Secondary | ICD-10-CM | POA: Diagnosis present

## 2018-09-30 DIAGNOSIS — Z881 Allergy status to other antibiotic agents status: Secondary | ICD-10-CM | POA: Diagnosis not present

## 2018-09-30 DIAGNOSIS — M810 Age-related osteoporosis without current pathological fracture: Secondary | ICD-10-CM | POA: Diagnosis present

## 2018-09-30 DIAGNOSIS — J440 Chronic obstructive pulmonary disease with acute lower respiratory infection: Secondary | ICD-10-CM | POA: Diagnosis present

## 2018-09-30 DIAGNOSIS — K219 Gastro-esophageal reflux disease without esophagitis: Secondary | ICD-10-CM | POA: Diagnosis present

## 2018-09-30 DIAGNOSIS — E785 Hyperlipidemia, unspecified: Secondary | ICD-10-CM | POA: Diagnosis present

## 2018-09-30 DIAGNOSIS — Z7722 Contact with and (suspected) exposure to environmental tobacco smoke (acute) (chronic): Secondary | ICD-10-CM | POA: Diagnosis present

## 2018-09-30 DIAGNOSIS — Z882 Allergy status to sulfonamides status: Secondary | ICD-10-CM | POA: Diagnosis not present

## 2018-09-30 DIAGNOSIS — F419 Anxiety disorder, unspecified: Secondary | ICD-10-CM | POA: Diagnosis present

## 2018-09-30 DIAGNOSIS — D649 Anemia, unspecified: Secondary | ICD-10-CM | POA: Diagnosis present

## 2018-09-30 DIAGNOSIS — Z8249 Family history of ischemic heart disease and other diseases of the circulatory system: Secondary | ICD-10-CM | POA: Diagnosis not present

## 2018-09-30 DIAGNOSIS — J189 Pneumonia, unspecified organism: Secondary | ICD-10-CM | POA: Diagnosis present

## 2018-09-30 DIAGNOSIS — Z833 Family history of diabetes mellitus: Secondary | ICD-10-CM | POA: Diagnosis not present

## 2018-09-30 DIAGNOSIS — K581 Irritable bowel syndrome with constipation: Secondary | ICD-10-CM | POA: Diagnosis present

## 2018-09-30 LAB — CBC
HCT: 28.9 % — ABNORMAL LOW (ref 36.0–46.0)
Hemoglobin: 8.6 g/dL — ABNORMAL LOW (ref 12.0–15.0)
MCH: 23.5 pg — ABNORMAL LOW (ref 26.0–34.0)
MCHC: 29.8 g/dL — ABNORMAL LOW (ref 30.0–36.0)
MCV: 79 fL — ABNORMAL LOW (ref 80.0–100.0)
Platelets: 554 10*3/uL — ABNORMAL HIGH (ref 150–400)
RBC: 3.66 MIL/uL — ABNORMAL LOW (ref 3.87–5.11)
RDW: 15.9 % — ABNORMAL HIGH (ref 11.5–15.5)
WBC: 8.1 10*3/uL (ref 4.0–10.5)
nRBC: 0 % (ref 0.0–0.2)

## 2018-09-30 LAB — COMPREHENSIVE METABOLIC PANEL
ALT: 12 U/L (ref 0–44)
AST: 20 U/L (ref 15–41)
Albumin: 2.4 g/dL — ABNORMAL LOW (ref 3.5–5.0)
Alkaline Phosphatase: 77 U/L (ref 38–126)
Anion gap: 8 (ref 5–15)
BUN: 24 mg/dL — ABNORMAL HIGH (ref 8–23)
CO2: 25 mmol/L (ref 22–32)
Calcium: 8.8 mg/dL — ABNORMAL LOW (ref 8.9–10.3)
Chloride: 104 mmol/L (ref 98–111)
Creatinine, Ser: 0.77 mg/dL (ref 0.44–1.00)
GFR calc Af Amer: >60 mL/min (ref >60)
GFR calc non Af Amer: >60 mL/min (ref >60)
Glucose, Bld: 131 mg/dL — ABNORMAL HIGH (ref 70–99)
Potassium: 4.9 mmol/L (ref 3.5–5.1)
Sodium: 137 mmol/L (ref 135–145)
Total Bilirubin: 0.4 mg/dL (ref 0.3–1.2)
Total Protein: 6.8 g/dL (ref 6.5–8.1)

## 2018-09-30 LAB — TSH: TSH: 1.346 u[IU]/mL (ref 0.350–4.500)

## 2018-09-30 LAB — ECHOCARDIOGRAM COMPLETE
Height: 67 in
Weight: 1920 oz

## 2018-09-30 MED ORDER — FUROSEMIDE 10 MG/ML IJ SOLN
40.0000 mg | Freq: Every day | INTRAMUSCULAR | Status: DC
  Administered 2018-09-30 – 2018-10-02 (×3): 40 mg via INTRAVENOUS
  Filled 2018-09-30 (×3): qty 4

## 2018-09-30 MED ORDER — UMECLIDINIUM-VILANTEROL 62.5-25 MCG/INH IN AEPB
1.0000 | INHALATION_SPRAY | Freq: Every day | RESPIRATORY_TRACT | Status: DC
  Administered 2018-09-30 – 2018-10-02 (×3): 1 via RESPIRATORY_TRACT
  Filled 2018-09-30: qty 14

## 2018-09-30 MED ORDER — LINACLOTIDE 145 MCG PO CAPS
145.0000 ug | ORAL_CAPSULE | Freq: Every day | ORAL | Status: DC | PRN
  Filled 2018-09-30: qty 1

## 2018-09-30 MED ORDER — SODIUM CHLORIDE 0.9 % IV SOLN: 250.0000 mL | INTRAVENOUS | Status: DC | PRN

## 2018-09-30 MED ORDER — METHYLPREDNISOLONE SODIUM SUCC 125 MG IJ SOLR
80.0000 mg | Freq: Three times a day (TID) | INTRAMUSCULAR | Status: DC
  Administered 2018-09-30 – 2018-10-02 (×7): 80 mg via INTRAVENOUS
  Filled 2018-09-30 (×7): qty 2

## 2018-09-30 MED ORDER — SODIUM CHLORIDE 0.9% FLUSH: 3.0000 mL | INTRAVENOUS | Status: DC | PRN

## 2018-09-30 MED ORDER — SODIUM CHLORIDE 0.9 % IV SOLN
500.0000 mg | INTRAVENOUS | Status: DC
  Administered 2018-09-30 – 2018-10-02 (×3): 500 mg via INTRAVENOUS
  Filled 2018-09-30 (×3): qty 500

## 2018-09-30 MED ORDER — ENOXAPARIN SODIUM 40 MG/0.4ML ~~LOC~~ SOLN
40.0000 mg | SUBCUTANEOUS | Status: DC
  Administered 2018-09-30 – 2018-10-02 (×3): 40 mg via SUBCUTANEOUS
  Filled 2018-09-30 (×3): qty 0.4

## 2018-09-30 MED ORDER — ALBUTEROL SULFATE HFA 108 (90 BASE) MCG/ACT IN AERS
2.0000 | INHALATION_SPRAY | RESPIRATORY_TRACT | Status: DC | PRN
  Filled 2018-09-30: qty 6.7

## 2018-09-30 MED ORDER — ALBUTEROL SULFATE (2.5 MG/3ML) 0.083% IN NEBU
2.5000 mg | INHALATION_SOLUTION | RESPIRATORY_TRACT | Status: DC | PRN
  Administered 2018-09-30: 2.5 mg via RESPIRATORY_TRACT

## 2018-09-30 MED ORDER — ALBUTEROL SULFATE (2.5 MG/3ML) 0.083% IN NEBU
3.0000 mL | INHALATION_SOLUTION | Freq: Three times a day (TID) | RESPIRATORY_TRACT | Status: DC
  Administered 2018-09-30 – 2018-10-02 (×7): 3 mL via RESPIRATORY_TRACT
  Filled 2018-09-30 (×8): qty 3

## 2018-09-30 MED ORDER — ACETAMINOPHEN 325 MG PO TABS
650.0000 mg | ORAL_TABLET | Freq: Four times a day (QID) | ORAL | Status: DC | PRN
  Administered 2018-09-30 – 2018-10-01 (×2): 650 mg via ORAL
  Filled 2018-09-30 (×2): qty 2

## 2018-09-30 MED ORDER — ACETAMINOPHEN 650 MG RE SUPP: 650.0000 mg | Freq: Four times a day (QID) | RECTAL | Status: DC | PRN

## 2018-09-30 MED ORDER — POTASSIUM CHLORIDE ER 10 MEQ PO TBCR
10.0000 meq | EXTENDED_RELEASE_TABLET | Freq: Every day | ORAL | Status: DC
  Administered 2018-09-30 – 2018-10-02 (×3): 10 meq via ORAL
  Filled 2018-09-30 (×7): qty 1

## 2018-09-30 MED ORDER — PRO-STAT SUGAR FREE PO LIQD
30.0000 mL | Freq: Three times a day (TID) | ORAL | Status: DC
  Administered 2018-09-30 – 2018-10-02 (×6): 30 mL via ORAL
  Filled 2018-09-30 (×14): qty 30

## 2018-09-30 MED ORDER — ALPRAZOLAM 0.5 MG PO TABS
0.5000 mg | ORAL_TABLET | Freq: Three times a day (TID) | ORAL | Status: DC
  Administered 2018-09-30 – 2018-10-02 (×6): 0.5 mg via ORAL
  Filled 2018-09-30 (×7): qty 1

## 2018-09-30 MED ORDER — TRAMADOL HCL 50 MG PO TABS: 50.0000 mg | ORAL_TABLET | Freq: Four times a day (QID) | ORAL | Status: DC | PRN

## 2018-09-30 MED ORDER — LORATADINE 10 MG PO TABS
10.0000 mg | ORAL_TABLET | Freq: Every day | ORAL | Status: DC
  Administered 2018-09-30 – 2018-10-02 (×3): 10 mg via ORAL
  Filled 2018-09-30 (×3): qty 1

## 2018-09-30 MED ORDER — MUPIROCIN 2 % EX OINT
1.0000 "application " | TOPICAL_OINTMENT | Freq: Two times a day (BID) | CUTANEOUS | Status: DC
  Administered 2018-09-30 – 2018-10-01 (×5): 1 via TOPICAL
  Filled 2018-09-30: qty 22

## 2018-09-30 MED ORDER — ZOLPIDEM TARTRATE 5 MG PO TABS
5.0000 mg | ORAL_TABLET | Freq: Every evening | ORAL | Status: DC | PRN
  Administered 2018-09-30: 02:00:00 5 mg via ORAL
  Filled 2018-09-30: qty 1

## 2018-09-30 MED ORDER — TAMSULOSIN HCL 0.4 MG PO CAPS
0.4000 mg | ORAL_CAPSULE | Freq: Every day | ORAL | Status: DC
  Administered 2018-09-30 – 2018-10-02 (×3): 0.4 mg via ORAL
  Filled 2018-09-30 (×3): qty 1

## 2018-09-30 NOTE — H&P (Addendum)
TRH H&P    Patient Demographics:    [name], is a 84 y.o. female  MRN: [id number]  DOB - 07-Aug-1934  Admit Date - 09/29/2018  Referring MD/NP/PA:  Denton Meek  Outpatient Primary MD for the patient is [name], MD  Patient coming from: home  Chief complaint- dyspnea   HPI:    [name]  is a 84 y.o. female,  w Hyperlipidemia, CHF (EF 60-65%), Copd, presents with c/o dyspnea x 5 days.  Pt notes cough w white sputum.  Pt denies fever, chills, cp, palp, orthopnea, pnd, n/v, abd pain, diarrhea, brbpr, black stool, lower ext edema.   In ED,  T 97.9, P 96 R 14, Bp 122/81 pox 96% on RA Wt 54.4kg  CXR IMPRESSION: Cardiomegaly with diffuse bilateral interstitial and airspace opacities, favor edema/CHF. Suspect small layering effusions.  Na 139, K 4.3, Bun 23, Creatinine 0.84 Wbc 10.9, Hgb 9.0, Plt 556 Ast 22, Alt 12, Alk phos 86, T. Bili 0.4 Alb 2.6 Trop 7 BNP 138   covid -19 negative  Pt will be admitted for dyspnea secondary to Copd exacerbation and mild acute CHF.          Review of systems:    In addition to the HPI above,  No Fever-chills, No Headache, No changes with Vision or hearing, No problems swallowing food or Liquids, No Chest pain, No Abdominal pain, No Nausea or Vomiting, bowel movements are regular, No Blood in stool or Urine, No dysuria, No new skin rashes or bruises, No new joints pains-aches,  No new weakness, tingling, numbness in any extremity, No recent weight gain or loss, No polyuria, polydypsia or polyphagia, No significant Mental Stressors.  All other systems reviewed and are negative.    Past History of the following :    Past Medical History:  Diagnosis Date  . Anxiety   . CHF (congestive heart failure) ([place])   . COPD (chronic obstructive pulmonary disease) ([place])   . GERD (gastroesophageal reflux disease)   . Hyperlipidemia   .  IBS (irritable bowel syndrome)   . Osteoporosis       Past Surgical History:  Procedure Laterality Date  . COLONOSCOPY  10/2009   sigmoid diverticula, multiple tubulovillous adneomas, needs surveillance Oct 2013  . ESOPHAGOGASTRODUODENOSCOPY  04/16/2011   Dr. Gala Romney: Noncritical Schatzkis ring( not manipulated because no dysphagia). Normal esophagus otherwise  large hiatal hernia. Gastric polyp-status post biopsy. Gastric erosions-staus post biopsy. Abnormal bulb-status post biopsy. benign small bowel, stomach biopsy with ulcerated gastric antral mucosa with foveolar hyperplasia and surface erosion, polyp with inflamed gastric antral mucosa   . ESOPHAGOGASTRODUODENOSCOPY N/A 04/29/2014   Dr. Gala Romney: Noncritical Schatzki's ring large hiatal hernia. Retained gastric contents.GES with slight delay  . HIP PINNING,CANNULATED Right 04/06/2018   Procedure: CANNULATED HIP PINNING;  Surgeon: Carole Civil, MD;  Location: [facility];  Service: Orthopedics;  Laterality: Right;      Social History:      Social History   Tobacco Use  . Smoking status: Passive Smoke Exposure - Never  Smoker  . Smokeless tobacco: Current User    Types: Snuff  Substance Use Topics  . Alcohol use: No       Family History :     Family History  Problem Relation Age of Onset  . Stroke Mother   . Diabetes Mother   . Heart disease Father   . Colon cancer Neg Hx        Home Medications:   Prior to Admission medications   Medication Sig Start Date End Date Taking? Authorizing Provider  albuterol (PROAIR HFA) 108 (90 Base) MCG/ACT inhaler Inhale 2 puffs into the lungs every 4 (four) hours as needed. 06/23/18   Gerlene Fee, NP  albuterol (PROVENTIL) (2.5 MG/3ML) 0.083% nebulizer solution Take 3 mLs (2.5 mg total) by nebulization every 6 (six) hours as needed for wheezing or shortness of breath. 06/23/18   Gerlene Fee, NP  ALPRAZolam Duanne Moron) 0.5 MG tablet Take 1 tablet (0.5 mg total) by mouth 3 (three)  times daily. 06/23/18   Gerlene Fee, NP  Amino Acids-Protein Hydrolys (FEEDING SUPPLEMENT, PRO-STAT SUGAR FREE 64,) LIQD Take 30 mLs by mouth 3 (three) times daily with meals. 06/16/18   [provider]  cetirizine (ZYRTEC) 10 MG tablet Take 10 mg by mouth daily as needed for allergies. 06/16/18   [provider]  linaclotide (LINZESS) 145 MCG CAPS capsule Take 1 capsule (145 mcg total) by mouth daily as needed. 06/23/18   Gerlene Fee, NP  mupirocin ointment (BACTROBAN) 2 % Apply 1 application topically 2 (two) times daily.  05/22/18   [provider]  NON FORMULARY Diet Type:  Mechanical soft,regular diet 06/16/18   [provider]  potassium chloride (K-DUR) 10 MEQ tablet Take 1 tablet (10 mEq total) by mouth daily. 06/23/18   Gerlene Fee, NP  tamsulosin (FLOMAX) 0.4 MG CAPS capsule Take 1 capsule (0.4 mg total) by mouth daily. 06/23/18   Gerlene Fee, NP  torsemide (DEMADEX) 20 MG tablet Take 1 tablet (20 mg total) by mouth 2 (two) times daily. 07/04/18   [name], MD  traMADol (ULTRAM) 50 MG tablet Take 1 tablet (50 mg total) by mouth every 6 (six) hours as needed (pain). 07/04/18   [name], MD  umeclidinium-vilanterol (ANORO ELLIPTA) 62.5-25 MCG/INH AEPB Inhale 1 puff into the lungs daily. Patient not taking: Reported on 06/29/2018 06/23/18   Gerlene Fee, NP  UNABLE TO FIND Silver Alginate - Clean wound left medial heel with N / S, apply sliver alginate and allevyn foam dressing,  Change daily and as needed 06/16/18   [provider]  Wound Dressings (ALLEVYN ADHESIVE EX) Allevyn Foam Dressing:   Apply to kyphotic bony prominence of spine and to lesion area right upper back and change every 3 days and as needed 06/16/18   [provider]  zolpidem (AMBIEN) 5 MG tablet Take 1 tablet (5 mg total) by mouth at bedtime as needed for up to 13 days for sleep. X 14 days 06/23/18 07/06/18  Gerlene Fee, NP     Allergies:      Allergies  Allergen Reactions  . Codeine Shortness Of Breath and Rash  . Codeine Anaphylaxis  . Levofloxacin Anaphylaxis    Patient was admitted to hospital with lung problems due to this medication  . Sulfonamide Derivatives Hives and Shortness Of Breath  . Sulfa Antibiotics Hives     Physical Exam:   Vitals  Blood pressure 115/77, pulse 92, temperature 97.9 F (  36.6 C), temperature source Oral, resp. rate 15, height 5' 7"  (1.702 m), weight 54.4 kg, SpO2 94 %.  1.  General: axoxo3  2. Psychiatric: euthymic  3. Neurologic: cn2-12 intact, reflexes 2+ symmetric, diffuse with no clonus, motor 5/5 in all 4 ext  4. HEENMT:  Anicteric, pupils 1.21m symmetric, direct, consensual, near intact Neck: no jvd  5. Respiratory : Bilateral basilar crackles, mild exp wheezing  6. Cardiovascular : rrr s1, s2,   7. Gastrointestinal:  Abd: soft, nt, nd, +bs  8. Skin:  Ext: no c/c/e,  No rash 0.8cm skin ulcer on the left heel area  9.Musculoskeletal:  Good ROM    Data Review:    CBC Recent Labs  Lab 09/29/18 1831  WBC 10.9*  HGB 9.0*  HCT 31.5*  PLT 556*  MCV 80.4  MCH 23.0*  MCHC 28.6*  RDW 16.2*   ------------------------------------------------------------------------------------------------------------------  Results for orders placed or performed during the hospital encounter of 09/29/18 (from the past 48 hour(s))  Basic metabolic panel     Status: Abnormal   Collection Time: 09/29/18  6:31 PM  Result Value Ref Range   Sodium 139 135 - 145 mmol/L   Potassium 4.3 3.5 - 5.1 mmol/L   Chloride 105 98 - 111 mmol/L   CO2 19 (L) 22 - 32 mmol/L   Glucose, Bld 85 70 - 99 mg/dL   BUN 23 8 - 23 mg/dL   Creatinine, Ser 0.84 0.44 - 1.00 mg/dL   Calcium 9.0 8.9 - 10.3 mg/dL   GFR calc non Af Amer >60 >60 mL/min   GFR calc Af Amer >60 >60 mL/min   Anion gap 15 5 - 15    Comment: [facility] [address] [place] [address] CBC     Status:  Abnormal   Collection Time: 09/29/18  6:31 PM  Result Value Ref Range   WBC 10.9 (H) 4.0 - 10.5 K/uL   RBC 3.92 3.87 - 5.11 MIL/uL   Hemoglobin 9.0 (L) 12.0 - 15.0 g/dL   HCT 31.5 (L) 36.0 - 46.0 %   MCV 80.4 80.0 - 100.0 fL   MCH 23.0 (L) 26.0 - 34.0 pg   MCHC 28.6 (L) 30.0 - 36.0 g/dL   RDW 16.2 (H) 11.5 - 15.5 %   Platelets 556 (H) 150 - 400 K/uL   nRBC 0.0 0.0 - 0.2 %    Comment: [facility]formed at [facility] [address] [place] [id number] Hepatic function panel     Status: Abnormal   Collection Time: 09/29/18  6:31 PM  Result Value Ref Range   Total Protein 7.2 6.5 - 8.1 g/dL   Albumin 2.6 (L) 3.5 - 5.0 g/dL   AST 22 15 - 41 U/L   ALT 12 0 - 44 U/L   Alkaline Phosphatase 86 38 - 126 U/L   Total Bilirubin 0.4 0.3 - 1.2 mg/dL   Bilirubin, Direct <0.1 0.0 - 0.2 mg/dL   Indirect Bilirubin NOT CALCULATED 0.3 - 0.9 mg/dL    Comment: [facility] [address] [place] [address] Troponin I (High Sensitivity)     Status: None   Collection Time: 09/29/18  6:31 PM  Result Value Ref Range   Troponin I (High Sensitivity) 7 <18 ng/L    Comment: (NOTE) Elevated high sensitivity troponin I (hsTnI) values and significant  changes across serial measurements may suggest ACS but many other  chronic and acute conditions are  known to elevate hsTnI results.  Refer to the Links section for chest pain algorithms and additional  guidance. Performed at [facility], [address], [place], [place] [address]   Brain natriuretic peptide     Status: Abnormal   Collection Time: 09/29/18  6:31 PM  Result Value Ref Range   B Natriuretic Peptide 138.0 (H) 0.0 - 100.0 pg/mL    Comment: Performed at [facility], [address], [place], [place] [address]  SARS Coronavirus 2 [facility] order, Performed in [facility] hospital lab) Nasopharyngeal Nasopharyngeal Swab     Status: None   Collection Time: 09/29/18  8:24 PM   Specimen: Nasopharyngeal Swab  Result Value Ref  Range   SARS Coronavirus 2 NEGATIVE NEGATIVE    Comment: (NOTE) If result is NEGATIVE SARS-CoV-2 target nucleic acids are NOT DETECTED. The SARS-CoV-2 RNA is generally detectable in upper and lower  respiratory specimens during the acute phase of infection. The lowest  concentration of SARS-CoV-2 viral copies this assay can detect is 250  copies / mL. A negative result does not preclude SARS-CoV-2 infection  and should not be used as the sole basis for treatment or other  patient management decisions.  A negative result may occur with  improper specimen collection / handling, submission of specimen other  than nasopharyngeal swab, presence of viral mutation(s) within the  areas targeted by this assay, and inadequate number of viral copies  (<250 copies / mL). A negative result must be combined with clinical  observations, patient history, and epidemiological information. If result is POSITIVE SARS-CoV-2 target nucleic acids are DETECTED. The SARS-CoV-2 RNA is generally detectable in upper and lower  respiratory specimens dur ing the acute phase of infection.  Positive  results are indicative of active infection with SARS-CoV-2.  Clinical  correlation with patient history and other diagnostic information is  necessary to determine patient infection status.  Positive results do  not rule out bacterial infection or co-infection with other viruses. If result is PRESUMPTIVE POSTIVE SARS-CoV-2 nucleic acids MAY BE PRESENT.   A presumptive positive result was obtained on the submitted specimen  and confirmed on repeat testing.  While 2019 novel coronavirus  (SARS-CoV-2) nucleic acids may be present in the submitted sample  additional confirmatory testing may be necessary for epidemiological  and / or clinical management purposes  to differentiate between  SARS-CoV-2 and other Sarbecovirus currently known to infect humans.  If clinically indicated additional testing with an alternate test   methodology [id number]) is advised. The SARS-CoV-2 RNA is generally  detectable in upper and lower respiratory sp ecimens during the acute  phase of infection. The expected result is Negative. Fact Sheet for Patients:  StrictlyIdeas.no Fact Sheet for Healthcare Providers: BankingDealers.co.za This test is not yet approved or cleared by the [place] FDA and has been authorized for detection and/or diagnosis of SARS-CoV-2 by FDA under an Emergency Use Authorization (EUA).  This EUA will remain in effect (meaning this test can be used) for the duration of the COVID-19 declaration under Section 564(b)(1) of the Act, 21 U.S.C. section 360bbb-3(b)(1), unless the authorization is terminated or revoked sooner. Performed at [facility], [address] [place], [place] [address]   Troponin I (High Sensitivity)     Status: None   Collection Time: 09/29/18  9:49 PM  Result Value Ref Range   Troponin I (High Sensitivity) 7 <18 ng/L    Comment: (NOTE) Elevated high sensitivity troponin I (hsTnI) values and significant  changes across  serial measurements may suggest ACS but many other  chronic and acute conditions are known to elevate hsTnI results.  Refer to the "Links" section for chest pain algorithms and additional  guidance. Performed at [facility], [address] [place], [place] [address]     Chemistries  Recent Labs  Lab 09/29/18 1831  NA 139  K 4.3  CL 105  CO2 19*  GLUCOSE 85  BUN 23  CREATININE 0.84  CALCIUM 9.0  AST 22  ALT 12  ALKPHOS 86  BILITOT 0.4   ------------------------------------------------------------------------------------------------------------------  ------------------------------------------------------------------------------------------------------------------ GFR: Estimated Creatinine Clearance: 45.1 mL/min (by C-G formula based on SCr of 0.84 mg/dL). Liver Function Tests: Recent Labs  Lab  09/29/18 1831  AST 22  ALT 12  ALKPHOS 86  BILITOT 0.4  PROT 7.2  ALBUMIN 2.6*   No results for input(s): LIPASE, AMYLASE in the last 168 hours. No results for input(s): AMMONIA in the last 168 hours. Coagulation Profile: No results for input(s): INR, PROTIME in the last 168 hours. Cardiac Enzymes: No results for input(s): CKTOTAL, CKMB, CKMBINDEX, TROPONINI in the last 168 hours. BNP (last 3 results) No results for input(s): PROBNP in the last 8760 hours. HbA1C: No results for input(s): HGBA1C in the last 72 hours. CBG: No results for input(s): GLUCAP in the last 168 hours. Lipid Profile: No results for input(s): CHOL, HDL, LDLCALC, TRIG, CHOLHDL, LDLDIRECT in the last 72 hours. Thyroid Function Tests: No results for input(s): TSH, T4TOTAL, FREET4, T3FREE, THYROIDAB in the last 72 hours. Anemia Panel: No results for input(s): VITAMINB12, FOLATE, FERRITIN, TIBC, IRON, RETICCTPCT in the last 72 hours.  --------------------------------------------------------------------------------------------------------------- Urine analysis:    Component Value Date/Time   COLORURINE STRAW (A) 06/03/2018 2055   APPEARANCEUR CLEAR 06/03/2018 2055   LABSPEC 1.005 06/03/2018 2055   PHURINE 5.0 06/03/2018 2055   GLUCOSEU NEGATIVE 06/03/2018 2055   HGBUR NEGATIVE 06/03/2018 2055   BILIRUBINUR NEGATIVE 06/03/2018 2055   KETONESUR NEGATIVE 06/03/2018 2055   PROTEINUR NEGATIVE 06/03/2018 2055   UROBILINOGEN 0.2 03/01/2012 2011   NITRITE NEGATIVE 06/03/2018 2055   LEUKOCYTESUR NEGATIVE 06/03/2018 2055      Imaging Results:    Dg Chest Portable 1 View  Result Date: 09/29/2018 CLINICAL DATA:  Shortness of breath EXAM: PORTABLE CHEST 1 VIEW COMPARISON:  06/29/2018 FINDINGS: Cardiomegaly. Diffuse interstitial and alveolar airspace opacities are again noted. Findings similar to prior study. Possible small effusions. No acute bony abnormality. IMPRESSION: Cardiomegaly with diffuse bilateral  interstitial and airspace opacities, favor edema/CHF. Suspect small layering effusions. Electronically Signed   By: Rolm Baptise M.D.   On: 09/29/2018 20:48    ekg nsr at 100, nl axis, nl int, no st-t changes c/w ischemia   Assessment & Plan:    Active Problems:   Dyspnea  Dyspnea secondary to acute on chronicdiastolic CHF,  As well as Copd exacerbation  Acute diastolic chf Tele Trop I  Check tsh Check cardiac echo Lasix 85m iv qday  Copd exacerbation Solumedrol 876miv q8h zithromax 50060mv qday Anoro 1puff qday Albuterol HFA 2puff tid and q6h prn  Constipation Cont Linzess  Severe protein calorie malnutrition prostat 30 mL po bid  Skin ulcer Wound care   DVT Prophylaxis-   Lovenox - SCDs   AM Labs Ordered, also please review Full Orders  Family Communication: Admission, patients condition and plan of care including tests being ordered have been discussed with the patient  who indicate understanding and agree with the plan and Code Status.  Code  Status:  FULL CODE per patient, daughter present at bedside  Admission status: Observation: Based on patients clinical presentation and evaluation of above clinical data, I have made determination that patient meets observationcriteria at this time.   Time spent in minutes : 55   Jani Gravel M.D on 09/30/2018 at 12:32 AM

## 2018-09-30 NOTE — Progress Notes (Signed)
Pt arrives to room via stretcher from ED. Denies c/o. Dressing to left heel and r) mid back intact.  IV site removed due to accidentally pulled out by pt upon movement into bed.

## 2018-09-30 NOTE — Progress Notes (Signed)
As of 1019 patient's Anoro has not been received in ER, unable to administer at this time.

## 2018-09-30 NOTE — Progress Notes (Signed)
*  PRELIMINARY RESULTS* Echocardiogram 2D Echocardiogram has been performed.  [name] 09/30/2018, 3:10 PM

## 2018-09-30 NOTE — Progress Notes (Signed)
Subjective: This is an 84 year old who has multiple medical problems and who had her daughter called my office yesterday with concerns about her shortness of breath.  She is being followed by home health services and had a chest x-ray from the home health agency but I have not had a report on that yet.  Her daughter told me that the home nurse was concerned that [name] had pneumonia and considering her COPD heart failure and multiple recent admissions I told him I thought they should bring her to the emergency department for evaluation and treatment.  Chest x-ray in the emergency department showed bilateral interstitial and airspace opacities suggestive of CHF.  She would seem to be having COPD exacerbation as well.  She says she feels better this morning.  She is still coughing.  She is still short of breath but not as badly.  No chest pain.  Objective: Vital signs in last 24 hours: Temp:  [97.9 F (36.6 C)] 97.9 F (36.6 C) (09/25 1716) Pulse Rate:  [78-96] 78 (09/26 0700) Resp:  [14-19] 16 (09/26 0700) BP: (90-122)/(58-81) 93/58 (09/26 0700) SpO2:  [94 %-98 %] 95 % (09/26 0700) Weight:  [54.4 kg] 54.4 kg (09/25 1713) Weight change:     Intake/Output from previous day: 09/25 0701 - 09/26 0700 In: 250 [IV Piggyback:250] Out: -   PHYSICAL EXAM General appearance: alert, cooperative and Very hard of hearing Resp: rales bilaterally and rhonchi bilaterally Cardio: regular rate and rhythm, S1, S2 normal, no murmur, click, rub or gallop GI: soft, non-tender; bowel sounds normal; no masses,  no organomegaly Extremities: Trace to 1+ edema  Lab Results:  Results for orders placed or performed during the hospital encounter of 09/29/18 (from the past 48 hour(s))  Basic metabolic panel     Status: Abnormal   Collection Time: 09/29/18  6:31 PM  Result Value Ref Range   Sodium 139 135 - 145 mmol/L   Potassium 4.3 3.5 - 5.1 mmol/L   Chloride 105 98 - 111 mmol/L   CO2 19 (L) 22 - 32 mmol/L    Glucose, Bld 85 70 - 99 mg/dL   BUN 23 8 - 23 mg/dL   Creatinine, Ser 0.84 0.44 - 1.00 mg/dL   Calcium 9.0 8.9 - 10.3 mg/dL   GFR calc non Af Amer >60 >60 mL/min   GFR calc Af Amer >60 >60 mL/min   Anion gap 15 5 - 15    Comment: Performed at [facility], [address] [place], [place] [address]  CBC     Status: Abnormal   Collection Time: 09/29/18  6:31 PM  Result Value Ref Range   WBC 10.9 (H) 4.0 - 10.5 K/uL   RBC 3.92 3.87 - 5.11 MIL/uL   Hemoglobin 9.0 (L) 12.0 - 15.0 g/dL   HCT 31.5 (L) 36.0 - 46.0 %   MCV 80.4 80.0 - 100.0 fL   MCH 23.0 (L) 26.0 - 34.0 pg   MCHC 28.6 (L) 30.0 - 36.0 g/dL   RDW 16.2 (H) 11.5 - 15.5 %   Platelets 556 (H) 150 - 400 K/uL   nRBC 0.0 0.0 - 0.2 %    Comment: Performed at [facility], [address], [place], [place] [address]  Hepatic function panel     Status: Abnormal   Collection Time: 09/29/18  6:31 PM  Result Value Ref Range   Total Protein 7.2 6.5 - 8.1 g/dL   Albumin 2.6 (L) 3.5 - 5.0 g/dL   AST 22  15 - 41 U/L   ALT 12 0 - 44 U/L   Alkaline Phosphatase 86 38 - 126 U/L   Total Bilirubin 0.4 0.3 - 1.2 mg/dL   Bilirubin, Direct <0.1 0.0 - 0.2 mg/dL   Indirect Bilirubin NOT CALCULATED 0.3 - 0.9 mg/dL    Comment: Performed at [facility], [address] [place], [place] [address]  Troponin I (High Sensitivity)     Status: None   Collection Time: 09/29/18  6:31 PM  Result Value Ref Range   Troponin I (High Sensitivity) 7 <18 ng/L    Comment: (NOTE) Elevated high sensitivity troponin I (hsTnI) values and significant  changes across serial measurements may suggest ACS but many other  chronic and acute conditions are known to elevate hsTnI results.  Refer to the Links section for chest pain algorithms and additional  guidance. Performed at [facility], [address], [place], [place] [address]   Brain natriuretic peptide     Status: Abnormal   Collection Time: 09/29/18  6:31 PM  Result Value Ref Range   B Natriuretic Peptide  138.0 (H) 0.0 - 100.0 pg/mL    Comment: Performed at [facility], [address], [place], [place] [address]  SARS Coronavirus 2 [facility] order, Performed in [facility] hospital lab) Nasopharyngeal Nasopharyngeal Swab     Status: None   Collection Time: 09/29/18  8:24 PM   Specimen: Nasopharyngeal Swab  Result Value Ref Range   SARS Coronavirus 2 NEGATIVE NEGATIVE    Comment: (NOTE) If result is NEGATIVE SARS-CoV-2 target nucleic acids are NOT DETECTED. The SARS-CoV-2 RNA is generally detectable in upper and lower  respiratory specimens during the acute phase of infection. The lowest  concentration of SARS-CoV-2 viral copies this assay can detect is 250  copies / mL. A negative result does not preclude SARS-CoV-2 infection  and should not be used as the sole basis for treatment or other  patient management decisions.  A negative result may occur with  improper specimen collection / handling, submission of specimen other  than nasopharyngeal swab, presence of viral mutation(s) within the  areas targeted by this assay, and inadequate number of viral copies  (<250 copies / mL). A negative result must be combined with clinical  observations, patient history, and epidemiological information. If result is POSITIVE SARS-CoV-2 target nucleic acids are DETECTED. The SARS-CoV-2 RNA is generally detectable in upper and lower  respiratory specimens dur ing the acute phase of infection.  Positive  results are indicative of active infection with SARS-CoV-2.  Clinical  correlation with patient history and other diagnostic information is  necessary to determine patient infection status.  Positive results do  not rule out bacterial infection or co-infection with other viruses. If result is PRESUMPTIVE POSTIVE SARS-CoV-2 nucleic acids MAY BE PRESENT.   A presumptive positive result was obtained on the submitted specimen  and confirmed on repeat testing.  While 2019 novel coronavirus  (SARS-CoV-2)  nucleic acids may be present in the submitted sample  additional confirmatory testing may be necessary for epidemiological  and / or clinical management purposes  to differentiate between  SARS-CoV-2 and other Sarbecovirus currently known to infect humans.  If clinically indicated additional testing with an alternate test  methodology [phone]) is advised. The SARS-CoV-2 RNA is generally  detectable in upper and lower respiratory sp ecimens during the acute  phase of infection. The expected result is Negative. Fact Sheet for Patients:  StrictlyIdeas.no Fact Sheet for Healthcare Providers: BankingDealers.co.za This test is  not yet approved or cleared by the [place] and has been authorized for detection and/or diagnosis of SARS-CoV-2 by FDA under an Emergency Use Authorization (EUA).  This EUA will remain in effect (meaning this test can be used) for the duration of the COVID-19 declaration under Section 564(b)(1) of the Act, 21 U.S.C. section 360bbb-3(b)(1), unless the authorization is terminated or revoked sooner. Performed at [facility], [address] [place], [place] [address]   Troponin I (High Sensitivity)     Status: None   Collection Time: 09/29/18  9:49 PM  Result Value Ref Range   Troponin I (High Sensitivity) 7 <18 ng/L    Comment: (NOTE) Elevated high sensitivity troponin I (hsTnI) values and significant  changes across serial measurements may suggest ACS but many other  chronic and acute conditions are known to elevate hsTnI results.  Refer to the "Links" section for chest pain algorithms and additional  guidance. Performed at [facility], [address] [place], [place] [address]   Comprehensive metabolic panel     Status: Abnormal   Collection Time: 09/30/18  6:22 AM  Result Value Ref Range   Sodium 137 135 - 145 mmol/L   Potassium 4.9 3.5 - 5.1 mmol/L   Chloride 104 98 - 111 mmol/L   CO2 25 22 - 32  mmol/L   Glucose, Bld 131 (H) 70 - 99 mg/dL   BUN 24 (H) 8 - 23 mg/dL   Creatinine, Ser 0.77 0.44 - 1.00 mg/dL   Calcium 8.8 (L) 8.9 - 10.3 mg/dL   Total Protein 6.8 6.5 - 8.1 g/dL   Albumin 2.4 (L) 3.5 - 5.0 g/dL   AST 20 15 - 41 U/L   ALT 12 0 - 44 U/L   Alkaline Phosphatase 77 38 - 126 U/L   Total Bilirubin 0.4 0.3 - 1.2 mg/dL   GFR calc non Af Amer >60 >60 mL/min   GFR calc Af Amer >60 >60 mL/min   Anion gap 8 5 - 15    Comment: Performed at [facility], [address], [place], [place] [address]  CBC     Status: Abnormal   Collection Time: 09/30/18  6:22 AM  Result Value Ref Range   WBC 8.1 4.0 - 10.5 K/uL   RBC 3.66 (L) 3.87 - 5.11 MIL/uL   Hemoglobin 8.6 (L) 12.0 - 15.0 g/dL   HCT 28.9 (L) 36.0 - 46.0 %   MCV 79.0 (L) 80.0 - 100.0 fL   MCH 23.5 (L) 26.0 - 34.0 pg   MCHC 29.8 (L) 30.0 - 36.0 g/dL   RDW 15.9 (H) 11.5 - 15.5 %   Platelets 554 (H) 150 - 400 K/uL   nRBC 0.0 0.0 - 0.2 %    Comment: Performed at [facility], [address], [place], [place] [address]  TSH     Status: None   Collection Time: 09/30/18  6:22 AM  Result Value Ref Range   TSH 1.346 0.350 - 4.500 uIU/mL    Comment: Performed by a 3rd Generation assay with a functional sensitivity of <=0.01 uIU/mL. Performed at [facility], [address], [place], [place] [address]     ABGS No results for input(s): PHART, PO2ART, TCO2, HCO3 in the last 72 hours.  Invalid input(s): PCO2 CULTURES Recent Results (from the past 240 hour(s))  SARS Coronavirus 2 [facility] order, Performed in [facility] hospital lab) Nasopharyngeal Nasopharyngeal Swab     Status: None   Collection Time: 09/29/18  8:24 PM  Specimen: Nasopharyngeal Swab  Result Value Ref Range Status   SARS Coronavirus 2 NEGATIVE NEGATIVE Final    Comment: (NOTE) If result is NEGATIVE SARS-CoV-2 target nucleic acids are NOT DETECTED. The SARS-CoV-2 RNA is generally detectable in upper and lower  respiratory specimens during the acute phase of  infection. The lowest  concentration of SARS-CoV-2 viral copies this assay can detect is 250  copies / mL. A negative result does not preclude SARS-CoV-2 infection  and should not be used as the sole basis for treatment or other  patient management decisions.  A negative result may occur with  improper specimen collection / handling, submission of specimen other  than nasopharyngeal swab, presence of viral mutation(s) within the  areas targeted by this assay, and inadequate number of viral copies  (<250 copies / mL). A negative result must be combined with clinical  observations, patient history, and epidemiological information. If result is POSITIVE SARS-CoV-2 target nucleic acids are DETECTED. The SARS-CoV-2 RNA is generally detectable in upper and lower  respiratory specimens dur ing the acute phase of infection.  Positive  results are indicative of active infection with SARS-CoV-2.  Clinical  correlation with patient history and other diagnostic information is  necessary to determine patient infection status.  Positive results do  not rule out bacterial infection or co-infection with other viruses. If result is PRESUMPTIVE POSTIVE SARS-CoV-2 nucleic acids MAY BE PRESENT.   A presumptive positive result was obtained on the submitted specimen  and confirmed on repeat testing.  While 2019 novel coronavirus  (SARS-CoV-2) nucleic acids may be present in the submitted sample  additional confirmatory testing may be necessary for epidemiological  and / or clinical management purposes  to differentiate between  SARS-CoV-2 and other Sarbecovirus currently known to infect humans.  If clinically indicated additional testing with an alternate test  methodology [phone]) is advised. The SARS-CoV-2 RNA is generally  detectable in upper and lower respiratory sp ecimens during the acute  phase of infection. The expected result is Negative. Fact Sheet for Patients:   StrictlyIdeas.no Fact Sheet for Healthcare Providers: BankingDealers.co.za This test is not yet approved or cleared by the [place] FDA and has been authorized for detection and/or diagnosis of SARS-CoV-2 by FDA under an Emergency Use Authorization (EUA).  This EUA will remain in effect (meaning this test can be used) for the duration of the COVID-19 declaration under Section 564(b)(1) of the Act, 21 U.S.C. section 360bbb-3(b)(1), unless the authorization is terminated or revoked sooner. Performed at [facility], [address], [place], [place] [address]    Studies/Results: Dg Chest Portable 1 View  Result Date: 09/29/2018 CLINICAL DATA:  Shortness of breath EXAM: PORTABLE CHEST 1 VIEW COMPARISON:  06/29/2018 FINDINGS: Cardiomegaly. Diffuse interstitial and alveolar airspace opacities are again noted. Findings similar to prior study. Possible small effusions. No acute bony abnormality. IMPRESSION: Cardiomegaly with diffuse bilateral interstitial and airspace opacities, favor edema/CHF. Suspect small layering effusions. Electronically Signed   By: Rolm Baptise M.D.   On: 09/29/2018 20:48    Medications:  Prior to Admission: (Not in a hospital admission)  Scheduled: . albuterol  3 mL Inhalation TID  . ALPRAZolam  0.5 mg Oral TID  . enoxaparin (LOVENOX) injection  40 mg Subcutaneous Q24H  . feeding supplement (PRO-STAT SUGAR FREE 64)  30 mL Oral TID WC  . furosemide  40 mg Intravenous Daily  . loratadine  10 mg Oral Daily  . methylPREDNISolone (SOLU-MEDROL) injection  80 mg Intravenous Q8H  .  mupirocin ointment  1 application Topical BID  . potassium chloride  10 mEq Oral Daily  . tamsulosin  0.4 mg Oral Daily  . umeclidinium-vilanterol  1 puff Inhalation Daily   Continuous: . sodium chloride    . azithromycin Stopped (09/30/18 0245)   [id number] chloride, acetaminophen **OR** acetaminophen, albuterol, linaclotide, sodium  chloride flush, traMADol, zolpidem  Assesment: She was admitted with acute shortness of breath that appears to be a combination of heart failure and COPD exacerbation.  She is being treated and has improved but is not back to baseline  She has protein calorie malnutrition at baseline.  She is very hard of hearing so communication is difficult Active Problems:   COPD with acute exacerbation ([place])   Protein-calorie malnutrition, severe ([place])   Chronic anemia   Acute CHF (congestive heart failure) ([place])   Dyspnea    Plan: Continue current treatments.    LOS: 0 days   [name] 09/30/2018, 8:45 AM

## 2018-10-01 LAB — BASIC METABOLIC PANEL
Anion gap: 9 (ref 5–15)
BUN: 43 mg/dL — ABNORMAL HIGH (ref 8–23)
CO2: 25 mmol/L (ref 22–32)
Calcium: 8.8 mg/dL — ABNORMAL LOW (ref 8.9–10.3)
Chloride: 104 mmol/L (ref 98–111)
Creatinine, Ser: 0.84 mg/dL (ref 0.44–1.00)
GFR calc Af Amer: >60 mL/min (ref >60)
GFR calc non Af Amer: >60 mL/min (ref >60)
Glucose, Bld: 144 mg/dL — ABNORMAL HIGH (ref 70–99)
Potassium: 4.4 mmol/L (ref 3.5–5.1)
Sodium: 138 mmol/L (ref 135–145)

## 2018-10-01 LAB — TROPONIN I (HIGH SENSITIVITY): Troponin I (High Sensitivity): 5 ng/L (ref <18)

## 2018-10-01 MED ORDER — SODIUM CHLORIDE 0.9% FLUSH
3.0000 mL | Freq: Two times a day (BID) | INTRAVENOUS | Status: DC
  Administered 2018-10-01 (×3): 3 mL via INTRAVENOUS

## 2018-10-01 NOTE — [place] Notes (Signed)
Subjective: She says she feels better.  She is still coughing nonproductively.  Objective: Vital signs in last 24 hours: Temp:  [97.7 F (36.5 C)-98.5 F (36.9 C)] 98.5 F (36.9 C) (09/27 0557) Pulse Rate:  [79-92] 79 (09/27 0557) Resp:  [16-30] 16 (09/27 0557) BP: (96-118)/(59-80) 96/61 (09/27 0557) SpO2:  [94 %-100 %] 97 % (09/27 0800) Weight:  [55.5 kg] 55.5 kg (09/27 0557) Weight change: 1.068 kg Last BM Date: 09/30/18  Intake/Output from previous day: 09/26 0701 - 09/27 0700 In: 62 [P.O.:240; IV Piggyback:250] Out: 800 [Urine:800]  PHYSICAL EXAM General appearance: alert, cooperative and Very hard of hearing Resp: wheezes bilaterally Cardio: regular rate and rhythm, S1, S2 normal, no murmur, click, rub or gallop GI: soft, non-tender; bowel sounds normal; no masses,  no organomegaly Extremities: extremities normal, atraumatic, no cyanosis or edema  Lab Results:  Results for orders placed or performed during the hospital encounter of 09/29/18 (from the past 48 hour(s))  Basic metabolic panel     Status: Abnormal   Collection Time: 09/29/18  6:31 PM  Result Value Ref Range   Sodium 139 135 - 145 mmol/L   Potassium 4.3 3.5 - 5.1 mmol/L   Chloride 105 98 - 111 mmol/L   CO2 19 (L) 22 - 32 mmol/L   Glucose, Bld 85 70 - 99 mg/dL   BUN 23 8 - 23 mg/dL   Creatinine, Ser 0.84 0.44 - 1.00 mg/dL   Calcium 9.0 8.9 - 10.3 mg/dL   GFR calc non Af Amer >60 >60 mL/min   GFR calc Af Amer >60 >60 mL/min   Anion gap 15 5 - 15    Comment: Performed at [facility], [address], [place], [place] [address]  CBC     Status: Abnormal   Collection Time: 09/29/18  6:31 PM  Result Value Ref Range   WBC 10.9 (H) 4.0 - 10.5 K/uL   RBC 3.92 3.87 - 5.11 MIL/uL   Hemoglobin 9.0 (L) 12.0 - 15.0 g/dL   HCT 31.5 (L) 36.0 - 46.0 %   MCV 80.4 80.0 - 100.0 fL   MCH 23.0 (L) 26.0 - 34.0 pg   MCHC 28.6 (L) 30.0 - 36.0 g/dL   RDW 16.2 (H) 11.5 - 15.5 %   Platelets 556 (H) 150 - 400 K/uL    nRBC 0.0 0.0 - 0.2 %    Comment: Performed at [facility], [address], [place], [place] [address]  Hepatic function panel     Status: Abnormal   Collection Time: 09/29/18  6:31 PM  Result Value Ref Range   Total Protein 7.2 6.5 - 8.1 g/dL   Albumin 2.6 (L) 3.5 - 5.0 g/dL   AST 22 15 - 41 U/L   ALT 12 0 - 44 U/L   Alkaline Phosphatase 86 38 - 126 U/L   Total Bilirubin 0.4 0.3 - 1.2 mg/dL   Bilirubin, Direct <0.1 0.0 - 0.2 mg/dL   Indirect Bilirubin NOT CALCULATED 0.3 - 0.9 mg/dL    Comment: Performed at [facility], [address], [place], [place] [address]  Troponin I (High Sensitivity)     Status: None   Collection Time: 09/29/18  6:31 PM  Result Value Ref Range   Troponin I (High Sensitivity) 7 <18 ng/L    Comment: (NOTE) Elevated high sensitivity troponin I (hsTnI) values and significant  changes across serial measurements may suggest ACS but many other  chronic and acute conditions are known to elevate hsTnI results.  Refer to the Links section for chest pain algorithms and additional  guidance. Performed at [facility], [place], [address], [place], [place] [address]   Brain natriuretic peptide     Status: Abnormal   Collection Time: 09/29/18  6:31 PM  Result Value Ref Range   B Natriuretic Peptide 138.0 (H) 0.0 - 100.0 pg/mL    Comment: Performed at [facility], [address] [place], [place] [address]  SARS Coronavirus 2 [facility] order, Performed in [facility] hospital lab) Nasopharyngeal Nasopharyngeal Swab     Status: None   Collection Time: 09/29/18  8:24 PM   Specimen: Nasopharyngeal Swab  Result Value Ref Range   SARS Coronavirus 2 NEGATIVE NEGATIVE    Comment: (NOTE) If result is NEGATIVE SARS-CoV-2 target nucleic acids are NOT DETECTED. The SARS-CoV-2 RNA is generally detectable in upper and lower  respiratory specimens during the acute phase of infection. The lowest  concentration of SARS-CoV-2 viral copies this assay can detect is 250  copies / mL. A  negative result does not preclude SARS-CoV-2 infection  and should not be used as the sole basis for treatment or other  patient management decisions.  A negative result may occur with  improper specimen collection / handling, submission of specimen other  than nasopharyngeal swab, presence of viral mutation(s) within the  areas targeted by this assay, and inadequate number of viral copies  (<250 copies / mL). A negative result must be combined with clinical  observations, patient history, and epidemiological information. If result is POSITIVE SARS-CoV-2 target nucleic acids are DETECTED. The SARS-CoV-2 RNA is generally detectable in upper and lower  respiratory specimens dur ing the acute phase of infection.  Positive  results are indicative of active infection with SARS-CoV-2.  Clinical  correlation with patient history and other diagnostic information is  necessary to determine patient infection status.  Positive results do  not rule out bacterial infection or co-infection with other viruses. If result is PRESUMPTIVE POSTIVE SARS-CoV-2 nucleic acids MAY BE PRESENT.   A presumptive positive result was obtained on the submitted specimen  and confirmed on repeat testing.  While 2019 novel coronavirus  (SARS-CoV-2) nucleic acids may be present in the submitted sample  additional confirmatory testing may be necessary for epidemiological  and / or clinical management purposes  to differentiate between  SARS-CoV-2 and other Sarbecovirus currently known to infect humans.  If clinically indicated additional testing with an alternate test  methodology [phone]) is advised. The SARS-CoV-2 RNA is generally  detectable in upper and lower respiratory sp ecimens during the acute  phase of infection. The expected result is Negative. Fact Sheet for Patients:  StrictlyIdeas.no Fact Sheet for Healthcare Providers: BankingDealers.co.za This test is not  yet approved or cleared by the [place] FDA and has been authorized for detection and/or diagnosis of SARS-CoV-2 by FDA under an Emergency Use Authorization (EUA).  This EUA will remain in effect (meaning this test can be used) for the duration of the COVID-19 declaration under Section 564(b)(1) of the Act, 21 U.S.C. section 360bbb-3(b)(1), unless the authorization is terminated or revoked sooner. Performed at [facility], [address], [place], [place] [address]   Troponin I (High Sensitivity)     Status: None   Collection Time: 09/29/18  9:49 PM  Result Value Ref Range   Troponin I (High Sensitivity) 7 <18 ng/L    Comment: (NOTE) Elevated high sensitivity troponin I (hsTnI) values and significant  changes across serial measurements may suggest ACS but  many other  chronic and acute conditions are known to elevate hsTnI results.  Refer to the "Links" section for chest pain algorithms and additional  guidance. Performed at [facility], [address], [place], [place] [address]   Comprehensive metabolic panel     Status: Abnormal   Collection Time: 09/30/18  6:22 AM  Result Value Ref Range   Sodium 137 135 - 145 mmol/L   Potassium 4.9 3.5 - 5.1 mmol/L   Chloride 104 98 - 111 mmol/L   CO2 25 22 - 32 mmol/L   Glucose, Bld 131 (H) 70 - 99 mg/dL   BUN 24 (H) 8 - 23 mg/dL   Creatinine, Ser 0.77 0.44 - 1.00 mg/dL   Calcium 8.8 (L) 8.9 - 10.3 mg/dL   Total Protein 6.8 6.5 - 8.1 g/dL   Albumin 2.4 (L) 3.5 - 5.0 g/dL   AST 20 15 - 41 U/L   ALT 12 0 - 44 U/L   Alkaline Phosphatase 77 38 - 126 U/L   Total Bilirubin 0.4 0.3 - 1.2 mg/dL   GFR calc non Af Amer >60 >60 mL/min   GFR calc Af Amer >60 >60 mL/min   Anion gap 8 5 - 15    Comment: Performed at [facility], [address] [place], [place] [address]  CBC     Status: Abnormal   Collection Time: 09/30/18  6:22 AM  Result Value Ref Range   WBC 8.1 4.0 - 10.5 K/uL   RBC 3.66 (L) 3.87 - 5.11 MIL/uL   Hemoglobin 8.6 (L) 12.0  - 15.0 g/dL   HCT 28.9 (L) 36.0 - 46.0 %   MCV 79.0 (L) 80.0 - 100.0 fL   MCH 23.5 (L) 26.0 - 34.0 pg   MCHC 29.8 (L) 30.0 - 36.0 g/dL   RDW 15.9 (H) 11.5 - 15.5 %   Platelets 554 (H) 150 - 400 K/uL   nRBC 0.0 0.0 - 0.2 %    Comment: Performed at [facility], [address] [place], [place] [address]  TSH     Status: None   Collection Time: 09/30/18  6:22 AM  Result Value Ref Range   TSH 1.346 0.350 - 4.500 uIU/mL    Comment: Performed by a 3rd Generation assay with a functional sensitivity of <=0.01 uIU/mL. Performed at [facility], [address] [place], [place] [id number]   Basic metabolic panel     Status: Abnormal   Collection Time: 10/01/18  7:07 AM  Result Value Ref Range   Sodium 138 135 - 145 mmol/L   Potassium 4.4 3.5 - 5.1 mmol/L   Chloride 104 98 - 111 mmol/L   CO2 25 22 - 32 mmol/L   Glucose, Bld 144 (H) 70 - 99 mg/dL   BUN 43 (H) 8 - 23 mg/dL   Creatinine, Ser 0.84 0.44 - 1.00 mg/dL   Calcium 8.8 (L) 8.9 - 10.3 mg/dL   GFR calc non Af Amer >60 >60 mL/min   GFR calc Af Amer >60 >60 mL/min   Anion gap 9 5 - 15    Comment: Performed at [facility], [address], [place], [place] [address]  Troponin I (High Sensitivity)     Status: None   Collection Time: 10/01/18  7:07 AM  Result Value Ref Range   Troponin I (High Sensitivity) 5 <18 ng/L    Comment: (NOTE) Elevated high sensitivity troponin I (hsTnI) values and significant  changes across serial measurements may suggest ACS but many other  chronic and acute conditions are known to elevate hsTnI results.  Refer to the "Links" section for chest pain algorithms and additional  guidance. Performed at [facility], [address], [place], [place] [address]     ABGS No results for input(s): PHART, PO2ART, TCO2, HCO3 in the last 72 hours.  Invalid input(s): PCO2 CULTURES Recent Results (from the past 240 hour(s))  SARS Coronavirus 2 [facility] order, Performed in [facility] hospital lab) Nasopharyngeal  Nasopharyngeal Swab     Status: None   Collection Time: 09/29/18  8:24 PM   Specimen: Nasopharyngeal Swab  Result Value Ref Range Status   SARS Coronavirus 2 NEGATIVE NEGATIVE Final    Comment: (NOTE) If result is NEGATIVE SARS-CoV-2 target nucleic acids are NOT DETECTED. The SARS-CoV-2 RNA is generally detectable in upper and lower  respiratory specimens during the acute phase of infection. The lowest  concentration of SARS-CoV-2 viral copies this assay can detect is 250  copies / mL. A negative result does not preclude SARS-CoV-2 infection  and should not be used as the sole basis for treatment or other  patient management decisions.  A negative result may occur with  improper specimen collection / handling, submission of specimen other  than nasopharyngeal swab, presence of viral mutation(s) within the  areas targeted by this assay, and inadequate number of viral copies  (<250 copies / mL). A negative result must be combined with clinical  observations, patient history, and epidemiological information. If result is POSITIVE SARS-CoV-2 target nucleic acids are DETECTED. The SARS-CoV-2 RNA is generally detectable in upper and lower  respiratory specimens dur ing the acute phase of infection.  Positive  results are indicative of active infection with SARS-CoV-2.  Clinical  correlation with patient history and other diagnostic information is  necessary to determine patient infection status.  Positive results do  not rule out bacterial infection or co-infection with other viruses. If result is PRESUMPTIVE POSTIVE SARS-CoV-2 nucleic acids MAY BE PRESENT.   A presumptive positive result was obtained on the submitted specimen  and confirmed on repeat testing.  While 2019 novel coronavirus  (SARS-CoV-2) nucleic acids may be present in the submitted sample  additional confirmatory testing may be necessary for epidemiological  and / or clinical management purposes  to differentiate between   SARS-CoV-2 and other Sarbecovirus currently known to infect humans.  If clinically indicated additional testing with an alternate test  methodology [phone]) is advised. The SARS-CoV-2 RNA is generally  detectable in upper and lower respiratory sp ecimens during the acute  phase of infection. The expected result is Negative. Fact Sheet for Patients:  StrictlyIdeas.no Fact Sheet for Healthcare Providers: BankingDealers.co.za This test is not yet approved or cleared by the [place] FDA and has been authorized for detection and/or diagnosis of SARS-CoV-2 by FDA under an Emergency Use Authorization (EUA).  This EUA will remain in effect (meaning this test can be used) for the duration of the COVID-19 declaration under Section 564(b)(1) of the Act, 21 U.S.C. section 360bbb-3(b)(1), unless the authorization is terminated or revoked sooner. Performed at [facility], [address] [place], [place] [address]    Studies/Results: Dg Chest Portable 1 View  Result Date: 09/29/2018 CLINICAL DATA:  Shortness of breath EXAM: PORTABLE CHEST 1 VIEW COMPARISON:  06/29/2018 FINDINGS: Cardiomegaly. Diffuse interstitial and alveolar airspace opacities are again noted. Findings similar to prior study. Possible small effusions. No acute bony abnormality. IMPRESSION: Cardiomegaly with diffuse bilateral interstitial and airspace opacities, favor edema/CHF. Suspect small layering  effusions. Electronically Signed   By: Rolm Baptise M.D.   On: 09/29/2018 20:48    Medications:  Prior to Admission:  Medications Prior to Admission  Medication Sig Dispense Refill Last Dose  . acetaminophen (TYLENOL) 500 MG tablet Take 500 mg by mouth daily.   09/29/2018 at Unknown time  . albuterol (PROAIR HFA) 108 (90 Base) MCG/ACT inhaler Inhale 2 puffs into the lungs every 4 (four) hours as needed. 8 g 0 09/29/2018 at Unknown time  . albuterol (PROVENTIL) (2.5 MG/3ML) 0.083%  nebulizer solution Take 3 mLs (2.5 mg total) by nebulization every 6 (six) hours as needed for wheezing or shortness of breath. 75 mL 0 09/29/2018 at Unknown time  . ALPRAZolam (XANAX) 0.5 MG tablet Take 1 tablet (0.5 mg total) by mouth 3 (three) times daily. (Patient taking differently: Take 0.5 mg by mouth 2 (two) times daily. ) 45 tablet 0 09/29/2018 at Unknown time  . cetirizine (ZYRTEC) 10 MG tablet Take 10 mg by mouth daily as needed for allergies.   09/29/2018 at Unknown time  . diclofenac sodium (VOLTAREN) 1 % GEL Apply 2 g topically 2 (two) times daily.   09/29/2018 at Unknown time  . furosemide (LASIX) 40 MG tablet Take 40 mg by mouth daily.   09/29/2018 at Unknown time  . linaclotide (LINZESS) 145 MCG CAPS capsule Take 1 capsule (145 mcg total) by mouth daily as needed. 30 capsule 0 09/29/2018 at Unknown time  . Melatonin 1 MG TABS Take 5 mg by mouth daily as needed (sleep).   09/27/2018 at Unknown time  . Multiple Vitamin (MULTIVITAMIN WITH MINERALS) TABS tablet Take 1 tablet by mouth daily.   09/29/2018 at Unknown time  . pantoprazole (PROTONIX) 40 MG tablet Take 40 mg by mouth 3 (three) times daily after meals.   09/29/2018 at Unknown time  . potassium chloride (K-DUR) 10 MEQ tablet Take 1 tablet (10 mEq total) by mouth daily. 30 tablet 0 09/29/2018 at Unknown time  . tamsulosin (FLOMAX) 0.4 MG CAPS capsule Take 1 capsule (0.4 mg total) by mouth daily. 30 capsule 0 09/29/2018 at Unknown time  . traMADol (ULTRAM) 50 MG tablet Take 1 tablet (50 mg total) by mouth every 6 (six) hours as needed (pain). (Patient taking differently: Take 50 mg by mouth 2 (two) times daily. ) 30 tablet 0 09/29/2018 at Unknown time  . umeclidinium-vilanterol (ANORO ELLIPTA) 62.5-25 MCG/INH AEPB Inhale 1 puff into the lungs daily. 60 each 0 09/29/2018 at Unknown time  . zolpidem (AMBIEN) 5 MG tablet Take 1 tablet (5 mg total) by mouth at bedtime as needed for up to 13 days for sleep. X 14 days 5 tablet 0 09/29/2018 at Unknown  time   Scheduled: . albuterol  3 mL Inhalation TID  . ALPRAZolam  0.5 mg Oral TID  . enoxaparin (LOVENOX) injection  40 mg Subcutaneous Q24H  . feeding supplement (PRO-STAT SUGAR FREE 64)  30 mL Oral TID WC  . furosemide  40 mg Intravenous Daily  . loratadine  10 mg Oral Daily  . methylPREDNISolone (SOLU-MEDROL) injection  80 mg Intravenous Q8H  . mupirocin ointment  1 application Topical BID  . potassium chloride  10 mEq Oral Daily  . sodium chloride flush  3 mL Intravenous Q12H  . tamsulosin  0.4 mg Oral Daily  . umeclidinium-vilanterol  1 puff Inhalation Daily   Continuous: . sodium chloride    . azithromycin 500 mg (10/01/18 0057)   [id number] chloride, acetaminophen **OR** acetaminophen, albuterol, linaclotide,  sodium chloride flush, traMADol, zolpidem  Assesment: She is admitted with acute hypoxic respiratory failure on the basis of COPD exacerbation and acute diastolic heart failure.  She is substantially improved.  She is still coughing.  No other new complaints.  She has COPD at baseline which is pretty severe. Active Problems:   COPD with acute exacerbation (HCC)   Protein-calorie malnutrition, severe (HCC)   Chronic anemia   Acute CHF (congestive heart failure) (HCC)   Dyspnea   Acute diastolic heart failure ([place])    Plan: Add flutter valve.  Add incentive spirometry.  Continue other treatments.    LOS: 1 day   Alonza Bogus 10/01/2018, 8:49 AM

## 2018-10-02 MED ORDER — PREDNISONE 10 MG (21) PO TBPK: ORAL_TABLET | ORAL | 1 refills | Status: DC

## 2018-10-02 MED ORDER — AZITHROMYCIN 250 MG PO TABS
250.0000 mg | ORAL_TABLET | Freq: Every day | ORAL | Status: DC
  Administered 2018-10-02: 250 mg via ORAL
  Filled 2018-10-02: qty 1

## 2018-10-02 MED ORDER — AZITHROMYCIN 250 MG PO TABS: ORAL_TABLET | ORAL | 0 refills | Status: DC

## 2018-10-02 MED ORDER — PREDNISONE 20 MG PO TABS
40.0000 mg | ORAL_TABLET | Freq: Every day | ORAL | Status: DC
  Administered 2018-10-02: 40 mg via ORAL
  Filled 2018-10-02: qty 2

## 2018-10-02 NOTE — [place] Notes (Signed)
Pulse ox on RA -93% with  no c/o SOB. Dr. Luan Pulling notified with no new orders.

## 2018-10-02 NOTE — Discharge Summary (Signed)
Physician Discharge Summary  Patient ID: [name] MRN: [id number] DOB/AGE: October 20, 1934 84 y.o. Primary Care Physician:Starnisha Batrez, Percell Miller, MD Admit date: 09/29/2018 Discharge date: 10/02/2018    Discharge Diagnoses:   Active Problems:   Osteoporosis   GERD (gastroesophageal reflux disease)   COPD with acute exacerbation (HCC)   Anxiety   S/P internal fixation right hip fracture cannulated screw placement 04/06/18   Protein-calorie malnutrition, severe (HCC)   Chronic anemia   Acute CHF (congestive heart failure) (HCC)   Pneumonia   Dyspnea   Acute diastolic heart failure (HCC)   Allergies as of 10/02/2018      Reactions   Codeine Shortness Of Breath, Rash   Codeine Anaphylaxis   Levofloxacin Anaphylaxis   Patient was admitted to hospital with lung problems due to this medication   Sulfonamide Derivatives Hives, Shortness Of Breath   Sulfa Antibiotics Hives      Medication List    TAKE these medications   albuterol 108 (90 Base) MCG/ACT inhaler Commonly known as: ProAir HFA Inhale 2 puffs into the lungs every 4 (four) hours as needed.   albuterol (2.5 MG/3ML) 0.083% nebulizer solution Commonly known as: PROVENTIL Take 3 mLs (2.5 mg total) by nebulization every 6 (six) hours as needed for wheezing or shortness of breath.   ALPRAZolam 0.5 MG tablet Commonly known as: XANAX Take 1 tablet (0.5 mg total) by mouth 3 (three) times daily. What changed: when to take this   azithromycin 250 MG tablet Commonly known as: Zithromax Z-Pak Take by package instructions   cetirizine 10 MG tablet Commonly known as: ZYRTEC Take 10 mg by mouth daily as needed for allergies.   diclofenac sodium 1 % Gel Commonly known as: VOLTAREN Apply 2 g topically 2 (two) times daily.   furosemide 40 MG tablet Commonly known as: LASIX Take 40 mg by mouth daily.   linaclotide 145 MCG Caps capsule Commonly known as: LINZESS Take 1 capsule (145 mcg total) by mouth daily as needed.    Melatonin 1 MG Tabs Take 5 mg by mouth daily as needed (sleep).   multivitamin with minerals Tabs tablet Take 1 tablet by mouth daily.   pantoprazole 40 MG tablet Commonly known as: PROTONIX Take 40 mg by mouth 3 (three) times daily after meals.   potassium chloride 10 MEQ tablet Commonly known as: K-DUR Take 1 tablet (10 mEq total) by mouth daily.   predniSONE 10 MG (21) Tbpk tablet Commonly known as: STERAPRED UNI-PAK 21 TAB Take by package instructions   tamsulosin 0.4 MG Caps capsule Commonly known as: FLOMAX Take 1 capsule (0.4 mg total) by mouth daily.   traMADol 50 MG tablet Commonly known as: ULTRAM Take 1 tablet (50 mg total) by mouth every 6 (six) hours as needed (pain). What changed: when to take this   TYLENOL 500 MG tablet Generic drug: acetaminophen Take 500 mg by mouth daily.   umeclidinium-vilanterol 62.5-25 MCG/INH Aepb Commonly known as: ANORO ELLIPTA Inhale 1 puff into the lungs daily.   zolpidem 5 MG tablet Commonly known as: AMBIEN Take 1 tablet (5 mg total) by mouth at bedtime as needed for up to 13 days for sleep. X 14 days       Discharged Condition: Improved    Consults: None  Significant Diagnostic Studies: Dg Chest Portable 1 View  Result Date: 09/29/2018 CLINICAL DATA:  Shortness of breath EXAM: PORTABLE CHEST 1 VIEW COMPARISON:  06/29/2018 FINDINGS: Cardiomegaly. Diffuse interstitial and alveolar airspace opacities are again noted. Findings similar  to prior study. Possible small effusions. No acute bony abnormality. IMPRESSION: Cardiomegaly with diffuse bilateral interstitial and airspace opacities, favor edema/CHF. Suspect small layering effusions. Electronically Signed   By: Rolm Baptise M.D.   On: 09/29/2018 20:48    Lab Results: Basic Metabolic Panel: Recent Labs    09/30/18 0622 10/01/18 0707  NA 137 138  K 4.9 4.4  CL 104 104  CO2 25 25  GLUCOSE 131* 144*  BUN 24* 43*  CREATININE 0.77 0.84  CALCIUM 8.8* 8.8*    Liver Function Tests: Recent Labs    09/29/18 1831 09/30/18 0622  AST 22 20  ALT 12 12  ALKPHOS 86 77  BILITOT 0.4 0.4  PROT 7.2 6.8  ALBUMIN 2.6* 2.4*     CBC: Recent Labs    09/29/18 1831 09/30/18 0622  WBC 10.9* 8.1  HGB 9.0* 8.6*  HCT 31.5* 28.9*  MCV 80.4 79.0*  PLT 556* 554*    Recent Results (from the past 240 hour(s))  SARS Coronavirus 2 [facility] order, Performed in [facility] hospital lab) Nasopharyngeal Nasopharyngeal Swab     Status: None   Collection Time: 09/29/18  8:24 PM   Specimen: Nasopharyngeal Swab  Result Value Ref Range Status   SARS Coronavirus 2 NEGATIVE NEGATIVE Final    Comment: (NOTE) If result is NEGATIVE SARS-CoV-2 target nucleic acids are NOT DETECTED. The SARS-CoV-2 RNA is generally detectable in upper and lower  respiratory specimens during the acute phase of infection. The lowest  concentration of SARS-CoV-2 viral copies this assay can detect is 250  copies / mL. A negative result does not preclude SARS-CoV-2 infection  and should not be used as the sole basis for treatment or other  patient management decisions.  A negative result may occur with  improper specimen collection / handling, submission of specimen other  than nasopharyngeal swab, presence of viral mutation(s) within the  areas targeted by this assay, and inadequate number of viral copies  (<250 copies / mL). A negative result must be combined with clinical  observations, patient history, and epidemiological information. If result is POSITIVE SARS-CoV-2 target nucleic acids are DETECTED. The SARS-CoV-2 RNA is generally detectable in upper and lower  respiratory specimens dur ing the acute phase of infection.  Positive  results are indicative of active infection with SARS-CoV-2.  Clinical  correlation with patient history and other diagnostic information is  necessary to determine patient infection status.  Positive results do  not rule out bacterial infection or  co-infection with other viruses. If result is PRESUMPTIVE POSTIVE SARS-CoV-2 nucleic acids MAY BE PRESENT.   A presumptive positive result was obtained on the submitted specimen  and confirmed on repeat testing.  While 2019 novel coronavirus  (SARS-CoV-2) nucleic acids may be present in the submitted sample  additional confirmatory testing may be necessary for epidemiological  and / or clinical management purposes  to differentiate between  SARS-CoV-2 and other Sarbecovirus currently known to infect humans.  If clinically indicated additional testing with an alternate test  methodology [phone]) is advised. The SARS-CoV-2 RNA is generally  detectable in upper and lower respiratory sp ecimens during the acute  phase of infection. The expected result is Negative. Fact Sheet for Patients:  StrictlyIdeas.no Fact Sheet for Healthcare Providers: BankingDealers.co.za This test is not yet approved or cleared by the [place] FDA and has been authorized for detection and/or diagnosis of SARS-CoV-2 by FDA under an Emergency Use Authorization (EUA).  This EUA will remain in effect (meaning  this test can be used) for the duration of the COVID-19 declaration under Section 564(b)(1) of the Act, 21 U.S.C. section 360bbb-3(b)(1), unless the authorization is terminated or revoked sooner. Performed at [facility], [address] [place], [place] [address]      Hospital Course: This is an 84 year old who came to the emergency department because of increasing shortness of breath.  She had a chest x-ray done by her home health agency but the results of that were not available.  Her daughter called my office and said that her mother was much worse so I directed them to come to the emergency department.  She was found to have acute heart failure and some evidence of pneumonia.  She was treated for both and improved markedly pretty rapidly.  By the time of  discharge her lungs were clear she did not have any edema and she felt like she was back to her baseline and ready to go home  Discharge Exam: Blood pressure 105/68, pulse 75, temperature (!) 97.3 F (36.3 C), temperature source Oral, resp. rate 16, height 5\' 7"  (1.702 m), weight 55.5 kg, SpO2 94 %. She is awake and alert.  She is thin.  Chest is clear.  Heart is regular.  No edema.  Disposition: Home she will resume home health services  Discharge Instructions    Face-to-face encounter (required for Medicare/Medicaid patients)   Complete by: As directed    I Alonza Bogus certify that this patient is under my care and that I, or a nurse practitioner or physician's assistant working with me, had a face-to-face encounter that meets the physician face-to-face encounter requirements with this patient on 10/02/2018. The encounter with the patient was in whole, or in part for the following medical condition(s) which is the primary reason for home health care (List medical condition): Pneumonia/CHF   The encounter with the patient was in whole, or in part, for the following medical condition, which is the primary reason for home health care: Pneumonia/CHF   I certify that, based on my findings, the following services are medically necessary home health services:  Nursing Physical therapy     Reason for Medically Necessary Home Health Services: Skilled Nursing- Change/Decline in Patient Status   My clinical findings support the need for the above services: Unable to leave home safely without assistance and/or assistive device   Further, I certify that my clinical findings support that this patient is homebound due to: Unable to leave home safely without assistance   Home Health   Complete by: As directed    To provide the following care/treatments:  [place]          Signed: Alonza Bogus   10/02/2018, 8:26 AM

## 2018-10-02 NOTE — TOC Initial Note (Signed)
Transition of Care [facility]) - Initial/Assessment Note    Patient Details  Name: [name] MRN: [id number] Date of Birth: [birth year]  Transition of Care [facility]) CM/SW Contact:    Trish Mage, LCSW Phone Number: 10/02/2018, 8:58 AM  Clinical Narrative:     Patient to d/c today.  Daughter states she will pick patient up after 2 as she has a medical appointment early afternoon.  [facility] is active with patient; Vaughan Basta with [facility] alerted of patient's return.  Orders seen and appreciated.  Hospital follow up appointment made with PCP.  TOC sign off.              Expected Discharge Plan: [place] Barriers to Discharge: No Barriers Identified   Patient Goals and CMS Choice Patient states their goals for this hospitalization and ongoing recovery are:: "The doctor said I get to go home today."      Expected Discharge Plan and Services Expected Discharge Plan: [place]   Discharge Planning Services: CM Consult Post Acute Care Choice: Durable Medical Equipment Living arrangements for the past 2 months: Single Family Home Expected Discharge Date: 10/02/18                         HH Arranged: RN, PT [place] Agency: [facility] ([place]) Date [place]: 10/02/18 Time [place]: [phone] Representative spoke with at [place]: Vaughan Basta  Prior Living Arrangements/Services Living arrangements for the past 2 months: Door Lives with:: Relatives Patient language and need for interpreter reviewed:: Yes Do you feel safe going back to the place where you live?: Yes      Need for Family Participation in Patient Care: Yes (Comment) Care giver support system in place?: Yes (comment) Current home services: DME, Home PT, Home RN Criminal Activity/Legal Involvement Pertinent to Current Situation/Hospitalization: No - Comment as needed  Activities of Daily Living Home Assistive Devices/Equipment: Nebulizer, Bedside  commode/3-in-1, Walker (specify type) ADL Screening (condition at time of admission) Patient's cognitive ability adequate to safely complete daily activities?: Yes Is the patient deaf or have difficulty hearing?: Yes Does the patient have difficulty seeing, even when wearing glasses/contacts?: No Does the patient have difficulty concentrating, remembering, or making decisions?: No Patient able to express need for assistance with ADLs?: Yes Does the patient have difficulty dressing or bathing?: Yes Independently performs ADLs?: Yes (appropriate for developmental age) Does the patient have difficulty walking or climbing stairs?: Yes Weakness of Legs: Both Weakness of Arms/Hands: Both  Permission Sought/Granted Permission sought to share information with : Family Supports Permission granted to share information with : Yes, Verbal Permission Granted  Share Information with NAME: Rebecka Apley     Permission granted to share info w Relationship: daughter  Permission granted to share info w Contact Information: [phone]    Alcohol / Substance Use: Not Applicable Psych Involvement: No (comment)  Admission diagnosis:  Acute diastolic heart failure ([place]) [I50.31] Patient Active Problem List   Diagnosis Date Noted  . Acute diastolic heart failure ([place]) 09/30/2018  . Dyspnea 09/29/2018  . Pneumonia 06/30/2018  . Acute CHF (congestive heart failure) ([place]) 06/29/2018  . Vascular dementia without behavioral disturbance ([place]) 06/26/2018  . CAP (community acquired pneumonia) 06/23/2018  . Bilateral pleural effusion 06/23/2018  . Leukocytosis 06/20/2018  . Chronic anemia 06/20/2018  . Cognitive impairment 06/19/2018  . Bilateral lower extremity edema 06/18/2018  . Protein-calorie malnutrition, severe ([place])  06/18/2018  . S/P internal fixation right hip fracture cannulated screw placement 04/06/18 04/27/2018  . Urinary tract infection associated with catheterization of urinary tract ([place])  04/11/2018  . Altered mental status 04/10/2018  . Anxiety 04/10/2018  . Hyponatremia 04/10/2018  . UTI (urinary tract infection) due to urinary indwelling catheter ([place]) 04/10/2018  . Chronic constipation 04/09/2018  . Urinary retention 04/09/2018  . Closed right hip fracture ([place]) 04/05/2018  . GERD (gastroesophageal reflux disease) 04/05/2018  . COPD with acute exacerbation ([place]) 04/05/2018  . Hiatal hernia   . Dyspepsia 04/04/2014  . Hematochezia 04/30/2011  . RUQ pain 03/25/2011  . Adenomatous polyps 03/25/2011  . ABDOMINAL PAIN, LEFT LOWER QUADRANT 10/16/2009  . HYPERLIPIDEMIA 10/09/2009  . BRONCHITIS 10/09/2009  . Osteoporosis 10/09/2009   PCP:  [name], MD Pharmacy:   [place], [place] - [address] [place] [address] Phone: [phone] Fax: [phone]  [facility] [address] [facility] [place], [place] [address] [place]  [address] Phone: [phone] Fax: [phone]     Social Determinants of Health (SDOH) Interventions    Readmission Risk Interventions Readmission Risk Prevention Plan 07/03/2018 06/30/2018 04/15/2018  Transportation Screening - Complete Complete  PCP or Specialist Appt within 5-7 Days - - Complete  Home Care Screening - - Complete  Medication Review (RN CM) - - Complete  Medication Review (RN Care Manager) Complete - -  PCP or Specialist appointment within 3-5 days of discharge Not Complete - -  PCP/Specialist Appt Not Complete comments Pt going to SNF rehab. SNF MD will follow. - -  HRI or Home Care Consult Not Complete Complete -  HRI or Home Care Consult Pt Refusal Comments Pt going to SNF rehab - -  SW Recovery Care/Counseling Consult - Complete -  Palliative Care Screening Not Applicable - -  Skilled Nursing Facility Complete - -  Some recent data might be hidden

## 2018-10-02 NOTE — Care Management Important Message (Signed)
Important Message  Patient Details  Name: [name] MRN: [id number] Date of Birth: 12-May-1934   Medicare Important Message Given:  Yes     Tommy Medal 10/02/2018, 2:19 PM

## 2018-10-02 NOTE — Consult Note (Signed)
[place] nursing team did not perform consult, patient is to discharge to home today. HHRN in place.   [place], [place], [place]

## 2018-10-02 NOTE — Progress Notes (Signed)
Subjective: She feels much better.  Her cough is better.  Her breathing is back to baseline.  No chest pain.  No symptoms of heart failure.  Objective: Vital signs in last 24 hours: Temp:  [97.3 F (36.3 C)-98.1 F (36.7 C)] 97.3 F (36.3 C) (09/28 0639) Pulse Rate:  [75-89] 75 (09/28 0639) Resp:  [16] 16 (09/28 0639) BP: (90-105)/(53-69) 105/68 (09/28 0639) SpO2:  [94 %-99 %] 94 % (09/28 0801) FiO2 (%):  [2 %] 2 % (09/28 0639) Weight change:  Last BM Date: 09/30/18  Intake/Output from previous day: 09/27 0701 - 09/28 0700 In: 923 [P.O.:720; I.V.:3; IV Piggyback:200] Out: 700 [Urine:700]  PHYSICAL EXAM General appearance: alert, cooperative and no distress Resp: clear to auscultation bilaterally Cardio: regular rate and rhythm, S1, S2 normal, no murmur, click, rub or gallop GI: soft, non-tender; bowel sounds normal; no masses,  no organomegaly Extremities: extremities normal, atraumatic, no cyanosis or edema  Lab Results:  Results for orders placed or performed during the hospital encounter of 09/29/18 (from the past 48 hour(s))  Basic metabolic panel     Status: Abnormal   Collection Time: 10/01/18  7:07 AM  Result Value Ref Range   Sodium 138 135 - 145 mmol/L   Potassium 4.4 3.5 - 5.1 mmol/L   Chloride 104 98 - 111 mmol/L   CO2 25 22 - 32 mmol/L   Glucose, Bld 144 (H) 70 - 99 mg/dL   BUN 43 (H) 8 - 23 mg/dL   Creatinine, Ser 0.84 0.44 - 1.00 mg/dL   Calcium 8.8 (L) 8.9 - 10.3 mg/dL   GFR calc non Af Amer >60 >60 mL/min   GFR calc Af Amer >60 >60 mL/min   Anion gap 9 5 - 15    Comment: Performed at [facility], [address] [place], [place] [address]  Troponin I (High Sensitivity)     Status: None   Collection Time: 10/01/18  7:07 AM  Result Value Ref Range   Troponin I (High Sensitivity) 5 <18 ng/L    Comment: (NOTE) Elevated high sensitivity troponin I (hsTnI) values and significant  changes across serial measurements may suggest ACS but many other  chronic  and acute conditions are known to elevate hsTnI results.  Refer to the "Links" section for chest pain algorithms and additional  guidance. Performed at [facility], [address], [place], [place] [address]     ABGS No results for input(s): PHART, PO2ART, TCO2, HCO3 in the last 72 hours.  Invalid input(s): PCO2 CULTURES Recent Results (from the past 240 hour(s))  SARS Coronavirus 2 [facility] order, Performed in [facility] hospital lab) Nasopharyngeal Nasopharyngeal Swab     Status: None   Collection Time: 09/29/18  8:24 PM   Specimen: Nasopharyngeal Swab  Result Value Ref Range Status   SARS Coronavirus 2 NEGATIVE NEGATIVE Final    Comment: (NOTE) If result is NEGATIVE SARS-CoV-2 target nucleic acids are NOT DETECTED. The SARS-CoV-2 RNA is generally detectable in upper and lower  respiratory specimens during the acute phase of infection. The lowest  concentration of SARS-CoV-2 viral copies this assay can detect is 250  copies / mL. A negative result does not preclude SARS-CoV-2 infection  and should not be used as the sole basis for treatment or other  patient management decisions.  A negative result may occur with  improper specimen collection / handling, submission of specimen other  than nasopharyngeal swab, presence of viral mutation(s) within the  areas targeted by this assay,  and inadequate number of viral copies  (<250 copies / mL). A negative result must be combined with clinical  observations, patient history, and epidemiological information. If result is POSITIVE SARS-CoV-2 target nucleic acids are DETECTED. The SARS-CoV-2 RNA is generally detectable in upper and lower  respiratory specimens dur ing the acute phase of infection.  Positive  results are indicative of active infection with SARS-CoV-2.  Clinical  correlation with patient history and other diagnostic information is  necessary to determine patient infection status.  Positive results do  not rule out  bacterial infection or co-infection with other viruses. If result is PRESUMPTIVE POSTIVE SARS-CoV-2 nucleic acids MAY BE PRESENT.   A presumptive positive result was obtained on the submitted specimen  and confirmed on repeat testing.  While 2019 novel coronavirus  (SARS-CoV-2) nucleic acids may be present in the submitted sample  additional confirmatory testing may be necessary for epidemiological  and / or clinical management purposes  to differentiate between  SARS-CoV-2 and other Sarbecovirus currently known to infect humans.  If clinically indicated additional testing with an alternate test  methodology [phone]) is advised. The SARS-CoV-2 RNA is generally  detectable in upper and lower respiratory sp ecimens during the acute  phase of infection. The expected result is Negative. Fact Sheet for Patients:  StrictlyIdeas.no Fact Sheet for Healthcare Providers: BankingDealers.co.za This test is not yet approved or cleared by the [place] FDA and has been authorized for detection and/or diagnosis of SARS-CoV-2 by FDA under an Emergency Use Authorization (EUA).  This EUA will remain in effect (meaning this test can be used) for the duration of the COVID-19 declaration under Section 564(b)(1) of the Act, 21 U.S.C. section 360bbb-3(b)(1), unless the authorization is terminated or revoked sooner. Performed at [facility], [address], [place], [place] [address]    Studies/Results: No results found.  Medications:  Prior to Admission:  Medications Prior to Admission  Medication Sig Dispense Refill Last Dose  . acetaminophen (TYLENOL) 500 MG tablet Take 500 mg by mouth daily.   09/29/2018 at Unknown time  . albuterol (PROAIR HFA) 108 (90 Base) MCG/ACT inhaler Inhale 2 puffs into the lungs every 4 (four) hours as needed. 8 g 0 09/29/2018 at Unknown time  . albuterol (PROVENTIL) (2.5 MG/3ML) 0.083% nebulizer solution Take 3 mLs (2.5 mg  total) by nebulization every 6 (six) hours as needed for wheezing or shortness of breath. 75 mL 0 09/29/2018 at Unknown time  . ALPRAZolam (XANAX) 0.5 MG tablet Take 1 tablet (0.5 mg total) by mouth 3 (three) times daily. (Patient taking differently: Take 0.5 mg by mouth 2 (two) times daily. ) 45 tablet 0 09/29/2018 at Unknown time  . cetirizine (ZYRTEC) 10 MG tablet Take 10 mg by mouth daily as needed for allergies.   09/29/2018 at Unknown time  . diclofenac sodium (VOLTAREN) 1 % GEL Apply 2 g topically 2 (two) times daily.   09/29/2018 at Unknown time  . furosemide (LASIX) 40 MG tablet Take 40 mg by mouth daily.   09/29/2018 at Unknown time  . linaclotide (LINZESS) 145 MCG CAPS capsule Take 1 capsule (145 mcg total) by mouth daily as needed. 30 capsule 0 09/29/2018 at Unknown time  . Melatonin 1 MG TABS Take 5 mg by mouth daily as needed (sleep).   09/27/2018 at Unknown time  . Multiple Vitamin (MULTIVITAMIN WITH MINERALS) TABS tablet Take 1 tablet by mouth daily.   09/29/2018 at Unknown time  . pantoprazole (PROTONIX) 40 MG tablet Take 40  mg by mouth 3 (three) times daily after meals.   09/29/2018 at Unknown time  . potassium chloride (K-DUR) 10 MEQ tablet Take 1 tablet (10 mEq total) by mouth daily. 30 tablet 0 09/29/2018 at Unknown time  . tamsulosin (FLOMAX) 0.4 MG CAPS capsule Take 1 capsule (0.4 mg total) by mouth daily. 30 capsule 0 09/29/2018 at Unknown time  . traMADol (ULTRAM) 50 MG tablet Take 1 tablet (50 mg total) by mouth every 6 (six) hours as needed (pain). (Patient taking differently: Take 50 mg by mouth 2 (two) times daily. ) 30 tablet 0 09/29/2018 at Unknown time  . umeclidinium-vilanterol (ANORO ELLIPTA) 62.5-25 MCG/INH AEPB Inhale 1 puff into the lungs daily. 60 each 0 09/29/2018 at Unknown time  . zolpidem (AMBIEN) 5 MG tablet Take 1 tablet (5 mg total) by mouth at bedtime as needed for up to 13 days for sleep. X 14 days 5 tablet 0 09/29/2018 at Unknown time   Scheduled: . albuterol  3 mL  Inhalation TID  . ALPRAZolam  0.5 mg Oral TID  . azithromycin  250 mg Oral Daily  . enoxaparin (LOVENOX) injection  40 mg Subcutaneous Q24H  . feeding supplement (PRO-STAT SUGAR FREE 64)  30 mL Oral TID WC  . furosemide  40 mg Intravenous Daily  . loratadine  10 mg Oral Daily  . mupirocin ointment  1 application Topical BID  . potassium chloride  10 mEq Oral Daily  . predniSONE  40 mg Oral Q breakfast  . sodium chloride flush  3 mL Intravenous Q12H  . tamsulosin  0.4 mg Oral Daily  . umeclidinium-vilanterol  1 puff Inhalation Daily   Continuous: . sodium chloride     [id number] chloride, acetaminophen **OR** acetaminophen, albuterol, linaclotide, sodium chloride flush, traMADol, zolpidem  Assesment: She has COPD with acute exacerbation with some element of pneumonia.  She had acute diastolic heart failure.  She is markedly better from both.  Her lungs are clear now.  She feels well and wants to go home.  She has protein calorie malnutrition which is stable. Active Problems:   COPD with acute exacerbation (HCC)   Protein-calorie malnutrition, severe (HCC)   Chronic anemia   Acute CHF (congestive heart failure) (HCC)   Dyspnea   Acute diastolic heart failure ([place])    Plan: Discharge home today.  Discussed with her daughter    LOS: 2 days   [name] 10/02/2018, 8:22 AM

## 2018-10-02 NOTE — Plan of Care (Signed)

## 2018-10-03 DIAGNOSIS — M810 Age-related osteoporosis without current pathological fracture: Secondary | ICD-10-CM | POA: Diagnosis not present

## 2018-10-03 DIAGNOSIS — I5022 Chronic systolic (congestive) heart failure: Secondary | ICD-10-CM | POA: Diagnosis not present

## 2018-10-03 DIAGNOSIS — F0150 Vascular dementia without behavioral disturbance: Secondary | ICD-10-CM | POA: Diagnosis not present

## 2018-10-03 DIAGNOSIS — J449 Chronic obstructive pulmonary disease, unspecified: Secondary | ICD-10-CM | POA: Diagnosis not present

## 2018-10-03 DIAGNOSIS — L89623 Pressure ulcer of left heel, stage 3: Secondary | ICD-10-CM | POA: Diagnosis not present

## 2018-10-03 DIAGNOSIS — N390 Urinary tract infection, site not specified: Secondary | ICD-10-CM | POA: Diagnosis not present

## 2018-10-04 DIAGNOSIS — M810 Age-related osteoporosis without current pathological fracture: Secondary | ICD-10-CM | POA: Diagnosis not present

## 2018-10-04 DIAGNOSIS — L89623 Pressure ulcer of left heel, stage 3: Secondary | ICD-10-CM | POA: Diagnosis not present

## 2018-10-04 DIAGNOSIS — J449 Chronic obstructive pulmonary disease, unspecified: Secondary | ICD-10-CM | POA: Diagnosis not present

## 2018-10-04 DIAGNOSIS — I5022 Chronic systolic (congestive) heart failure: Secondary | ICD-10-CM | POA: Diagnosis not present

## 2018-10-04 DIAGNOSIS — F0150 Vascular dementia without behavioral disturbance: Secondary | ICD-10-CM | POA: Diagnosis not present

## 2018-10-04 DIAGNOSIS — N390 Urinary tract infection, site not specified: Secondary | ICD-10-CM | POA: Diagnosis not present

## 2018-10-05 DIAGNOSIS — F0150 Vascular dementia without behavioral disturbance: Secondary | ICD-10-CM | POA: Diagnosis not present

## 2018-10-05 DIAGNOSIS — L89623 Pressure ulcer of left heel, stage 3: Secondary | ICD-10-CM | POA: Diagnosis not present

## 2018-10-05 DIAGNOSIS — J449 Chronic obstructive pulmonary disease, unspecified: Secondary | ICD-10-CM | POA: Diagnosis not present

## 2018-10-05 DIAGNOSIS — N390 Urinary tract infection, site not specified: Secondary | ICD-10-CM | POA: Diagnosis not present

## 2018-10-05 DIAGNOSIS — M810 Age-related osteoporosis without current pathological fracture: Secondary | ICD-10-CM | POA: Diagnosis not present

## 2018-10-05 DIAGNOSIS — I5022 Chronic systolic (congestive) heart failure: Secondary | ICD-10-CM | POA: Diagnosis not present

## 2018-10-06 DIAGNOSIS — N390 Urinary tract infection, site not specified: Secondary | ICD-10-CM | POA: Diagnosis not present

## 2018-10-06 DIAGNOSIS — M810 Age-related osteoporosis without current pathological fracture: Secondary | ICD-10-CM | POA: Diagnosis not present

## 2018-10-06 DIAGNOSIS — F0150 Vascular dementia without behavioral disturbance: Secondary | ICD-10-CM | POA: Diagnosis not present

## 2018-10-06 DIAGNOSIS — L89623 Pressure ulcer of left heel, stage 3: Secondary | ICD-10-CM | POA: Diagnosis not present

## 2018-10-06 DIAGNOSIS — I5022 Chronic systolic (congestive) heart failure: Secondary | ICD-10-CM | POA: Diagnosis not present

## 2018-10-06 DIAGNOSIS — J449 Chronic obstructive pulmonary disease, unspecified: Secondary | ICD-10-CM | POA: Diagnosis not present

## 2018-10-09 DIAGNOSIS — F0150 Vascular dementia without behavioral disturbance: Secondary | ICD-10-CM | POA: Diagnosis not present

## 2018-10-09 DIAGNOSIS — L89623 Pressure ulcer of left heel, stage 3: Secondary | ICD-10-CM | POA: Diagnosis not present

## 2018-10-09 DIAGNOSIS — N390 Urinary tract infection, site not specified: Secondary | ICD-10-CM | POA: Diagnosis not present

## 2018-10-09 DIAGNOSIS — I5022 Chronic systolic (congestive) heart failure: Secondary | ICD-10-CM | POA: Diagnosis not present

## 2018-10-09 DIAGNOSIS — M810 Age-related osteoporosis without current pathological fracture: Secondary | ICD-10-CM | POA: Diagnosis not present

## 2018-10-09 DIAGNOSIS — J449 Chronic obstructive pulmonary disease, unspecified: Secondary | ICD-10-CM | POA: Diagnosis not present

## 2018-10-11 DIAGNOSIS — L89623 Pressure ulcer of left heel, stage 3: Secondary | ICD-10-CM | POA: Diagnosis not present

## 2018-10-11 DIAGNOSIS — F0150 Vascular dementia without behavioral disturbance: Secondary | ICD-10-CM | POA: Diagnosis not present

## 2018-10-11 DIAGNOSIS — M810 Age-related osteoporosis without current pathological fracture: Secondary | ICD-10-CM | POA: Diagnosis not present

## 2018-10-11 DIAGNOSIS — I5022 Chronic systolic (congestive) heart failure: Secondary | ICD-10-CM | POA: Diagnosis not present

## 2018-10-11 DIAGNOSIS — N390 Urinary tract infection, site not specified: Secondary | ICD-10-CM | POA: Diagnosis not present

## 2018-10-11 DIAGNOSIS — J449 Chronic obstructive pulmonary disease, unspecified: Secondary | ICD-10-CM | POA: Diagnosis not present

## 2018-10-12 DIAGNOSIS — F0150 Vascular dementia without behavioral disturbance: Secondary | ICD-10-CM | POA: Diagnosis not present

## 2018-10-12 DIAGNOSIS — N390 Urinary tract infection, site not specified: Secondary | ICD-10-CM | POA: Diagnosis not present

## 2018-10-12 DIAGNOSIS — M810 Age-related osteoporosis without current pathological fracture: Secondary | ICD-10-CM | POA: Diagnosis not present

## 2018-10-12 DIAGNOSIS — L89623 Pressure ulcer of left heel, stage 3: Secondary | ICD-10-CM | POA: Diagnosis not present

## 2018-10-12 DIAGNOSIS — J449 Chronic obstructive pulmonary disease, unspecified: Secondary | ICD-10-CM | POA: Diagnosis not present

## 2018-10-12 DIAGNOSIS — I5022 Chronic systolic (congestive) heart failure: Secondary | ICD-10-CM | POA: Diagnosis not present

## 2018-10-16 DIAGNOSIS — N390 Urinary tract infection, site not specified: Secondary | ICD-10-CM | POA: Diagnosis not present

## 2018-10-16 DIAGNOSIS — L89623 Pressure ulcer of left heel, stage 3: Secondary | ICD-10-CM | POA: Diagnosis not present

## 2018-10-16 DIAGNOSIS — J449 Chronic obstructive pulmonary disease, unspecified: Secondary | ICD-10-CM | POA: Diagnosis not present

## 2018-10-16 DIAGNOSIS — I5022 Chronic systolic (congestive) heart failure: Secondary | ICD-10-CM | POA: Diagnosis not present

## 2018-10-16 DIAGNOSIS — F0150 Vascular dementia without behavioral disturbance: Secondary | ICD-10-CM | POA: Diagnosis not present

## 2018-10-16 DIAGNOSIS — M810 Age-related osteoporosis without current pathological fracture: Secondary | ICD-10-CM | POA: Diagnosis not present

## 2018-10-17 DIAGNOSIS — J449 Chronic obstructive pulmonary disease, unspecified: Secondary | ICD-10-CM | POA: Diagnosis not present

## 2018-10-17 DIAGNOSIS — L89623 Pressure ulcer of left heel, stage 3: Secondary | ICD-10-CM | POA: Diagnosis not present

## 2018-10-17 DIAGNOSIS — F0150 Vascular dementia without behavioral disturbance: Secondary | ICD-10-CM | POA: Diagnosis not present

## 2018-10-17 DIAGNOSIS — M810 Age-related osteoporosis without current pathological fracture: Secondary | ICD-10-CM | POA: Diagnosis not present

## 2018-10-17 DIAGNOSIS — I5022 Chronic systolic (congestive) heart failure: Secondary | ICD-10-CM | POA: Diagnosis not present

## 2018-10-17 DIAGNOSIS — N390 Urinary tract infection, site not specified: Secondary | ICD-10-CM | POA: Diagnosis not present

## 2018-10-18 ENCOUNTER — Other Ambulatory Visit: Payer: Self-pay

## 2018-10-18 ENCOUNTER — Emergency Department (HOSPITAL_COMMUNITY): Payer: Medicare Other

## 2018-10-18 ENCOUNTER — Emergency Department (HOSPITAL_COMMUNITY)
Admission: EM | Admit: 2018-10-18 | Discharge: 2018-10-18 | Disposition: A | Discharge status: Home / Self Care | Payer: Medicare Other | Attending: Emergency Medicine | Admitting: Emergency Medicine

## 2018-10-18 ENCOUNTER — Encounter (HOSPITAL_COMMUNITY): Payer: Self-pay | Admitting: Emergency Medicine

## 2018-10-18 DIAGNOSIS — Z20828 Contact with and (suspected) exposure to other viral communicable diseases: Secondary | ICD-10-CM | POA: Insufficient documentation

## 2018-10-18 DIAGNOSIS — R069 Unspecified abnormalities of breathing: Secondary | ICD-10-CM | POA: Diagnosis not present

## 2018-10-18 DIAGNOSIS — I5020 Unspecified systolic (congestive) heart failure: Secondary | ICD-10-CM | POA: Insufficient documentation

## 2018-10-18 DIAGNOSIS — R0602 Shortness of breath: Secondary | ICD-10-CM | POA: Diagnosis not present

## 2018-10-18 DIAGNOSIS — R0902 Hypoxemia: Secondary | ICD-10-CM | POA: Diagnosis not present

## 2018-10-18 DIAGNOSIS — Z79899 Other long term (current) drug therapy: Secondary | ICD-10-CM | POA: Insufficient documentation

## 2018-10-18 DIAGNOSIS — R918 Other nonspecific abnormal finding of lung field: Secondary | ICD-10-CM | POA: Diagnosis not present

## 2018-10-18 DIAGNOSIS — J449 Chronic obstructive pulmonary disease, unspecified: Secondary | ICD-10-CM | POA: Insufficient documentation

## 2018-10-18 DIAGNOSIS — F17220 Nicotine dependence, chewing tobacco, uncomplicated: Secondary | ICD-10-CM | POA: Insufficient documentation

## 2018-10-18 DIAGNOSIS — I5022 Chronic systolic (congestive) heart failure: Secondary | ICD-10-CM | POA: Diagnosis not present

## 2018-10-18 DIAGNOSIS — Z7722 Contact with and (suspected) exposure to environmental tobacco smoke (acute) (chronic): Secondary | ICD-10-CM | POA: Diagnosis not present

## 2018-10-18 DIAGNOSIS — Z209 Contact with and (suspected) exposure to unspecified communicable disease: Secondary | ICD-10-CM | POA: Diagnosis not present

## 2018-10-18 DIAGNOSIS — F0150 Vascular dementia without behavioral disturbance: Secondary | ICD-10-CM | POA: Diagnosis not present

## 2018-10-18 DIAGNOSIS — L89623 Pressure ulcer of left heel, stage 3: Secondary | ICD-10-CM | POA: Diagnosis not present

## 2018-10-18 DIAGNOSIS — N390 Urinary tract infection, site not specified: Secondary | ICD-10-CM | POA: Diagnosis not present

## 2018-10-18 DIAGNOSIS — J189 Pneumonia, unspecified organism: Secondary | ICD-10-CM | POA: Diagnosis not present

## 2018-10-18 DIAGNOSIS — M810 Age-related osteoporosis without current pathological fracture: Secondary | ICD-10-CM | POA: Diagnosis not present

## 2018-10-18 DIAGNOSIS — I509 Heart failure, unspecified: Secondary | ICD-10-CM | POA: Diagnosis not present

## 2018-10-18 HISTORY — DX: Pneumonia, unspecified organism: J18.9

## 2018-10-18 LAB — CBC WITH DIFFERENTIAL/PLATELET
Abs Immature Granulocytes: 0.02 10*3/uL (ref 0.00–0.07)
Basophils Absolute: 0 10*3/uL (ref 0.0–0.1)
Basophils Relative: 1 %
Eosinophils Absolute: 0.3 10*3/uL (ref 0.0–0.5)
Eosinophils Relative: 4 %
HCT: 32.4 % — ABNORMAL LOW (ref 36.0–46.0)
Hemoglobin: 9.1 g/dL — ABNORMAL LOW (ref 12.0–15.0)
Immature Granulocytes: 0 %
Lymphocytes Relative: 16 %
Lymphs Abs: 1.4 10*3/uL (ref 0.7–4.0)
MCH: 23.5 pg — ABNORMAL LOW (ref 26.0–34.0)
MCHC: 28.1 g/dL — ABNORMAL LOW (ref 30.0–36.0)
MCV: 83.5 fL (ref 80.0–100.0)
Monocytes Absolute: 0.9 10*3/uL (ref 0.1–1.0)
Monocytes Relative: 11 %
Neutro Abs: 6.1 10*3/uL (ref 1.7–7.7)
Neutrophils Relative %: 68 %
Platelets: 372 10*3/uL (ref 150–400)
RBC: 3.88 MIL/uL (ref 3.87–5.11)
RDW: 17.4 % — ABNORMAL HIGH (ref 11.5–15.5)
WBC: 8.7 10*3/uL (ref 4.0–10.5)
nRBC: 0 % (ref 0.0–0.2)

## 2018-10-18 LAB — COMPREHENSIVE METABOLIC PANEL
ALT: 14 U/L (ref 0–44)
AST: 20 U/L (ref 15–41)
Albumin: 2.9 g/dL — ABNORMAL LOW (ref 3.5–5.0)
Alkaline Phosphatase: 83 U/L (ref 38–126)
Anion gap: 12 (ref 5–15)
BUN: 19 mg/dL (ref 8–23)
CO2: 26 mmol/L (ref 22–32)
Calcium: 8.5 mg/dL — ABNORMAL LOW (ref 8.9–10.3)
Chloride: 100 mmol/L (ref 98–111)
Creatinine, Ser: 0.98 mg/dL (ref 0.44–1.00)
GFR calc Af Amer: >60 mL/min (ref >60)
GFR calc non Af Amer: 54 mL/min — ABNORMAL LOW (ref >60)
Glucose, Bld: 90 mg/dL (ref 70–99)
Potassium: 4 mmol/L (ref 3.5–5.1)
Sodium: 138 mmol/L (ref 135–145)
Total Bilirubin: 0.2 mg/dL — ABNORMAL LOW (ref 0.3–1.2)
Total Protein: 6.7 g/dL (ref 6.5–8.1)

## 2018-10-18 LAB — NOVEL CORONAVIRUS, NAA: SARS-CoV-2, NAA: NOT DETECTED

## 2018-10-18 LAB — BRAIN NATRIURETIC PEPTIDE: B Natriuretic Peptide: 48 pg/mL (ref 0.0–100.0)

## 2018-10-18 MED ORDER — HYDROCODONE-ACETAMINOPHEN 5-325 MG PO TABS
1.0000 | ORAL_TABLET | Freq: Once | ORAL | Status: AC
  Administered 2018-10-18: 1 via ORAL
  Filled 2018-10-18: qty 1

## 2018-10-18 MED ORDER — FUROSEMIDE 40 MG PO TABS
40.0000 mg | ORAL_TABLET | Freq: Once | ORAL | Status: AC
  Administered 2018-10-18: 23:00:00 40 mg via ORAL
  Filled 2018-10-18: qty 1

## 2018-10-18 NOTE — ED Notes (Signed)
Pt IV questionably infiltrated. Kenyon Ana, SWOT nurse to restart IV.

## 2018-10-18 NOTE — ED Triage Notes (Signed)
Pt with c/o shortness of breath after being diagnosed today with PNA. Pt's O2 sats were in upper 80"s per EMS and pt had some scattered wheezes throughout. Pt was given neb @ home and placed on O2 @2l . Pt was also noted to be hypotensive with SBP in 80's per EMS.

## 2018-10-18 NOTE — ED Provider Notes (Signed)
[facility] EMERGENCY DEPARTMENT Provider Note   CSN: [id number] Arrival date & time: 10/18/18  1944     History   Chief Complaint Chief Complaint  Patient presents with  . Shortness of Breath    HPI [name] is a 84 y.o. female.     Patient was sent to the emergency department because of saturation levels at home per reading very low and her chest x-ray showed questionable pneumonia when patient arrived in the emergency department her O2 sats on room air was in the 90s  The history is provided by the patient. No language interpreter was used.  Shortness of Breath Severity:  Mild Onset quality:  Unable to specify Progression:  Waxing and waning Chronicity:  Chronic Context: not known allergens   Relieved by:  Nothing Worsened by:  Nothing Ineffective treatments:  None tried Associated symptoms: no abdominal pain, no chest pain, no cough, no headaches and no rash     Past Medical History:  Diagnosis Date  . Anxiety   . CHF (congestive heart failure) ([place])   . COPD (chronic obstructive pulmonary disease) ([place])   . GERD (gastroesophageal reflux disease)   . Hyperlipidemia   . IBS (irritable bowel syndrome)   . Osteoporosis   . Pneumonia     Patient Active Problem List   Diagnosis Date Noted  . Acute diastolic heart failure ([place]) 09/30/2018  . Dyspnea 09/29/2018  . Pneumonia 06/30/2018  . Acute CHF (congestive heart failure) ([place]) 06/29/2018  . Vascular dementia without behavioral disturbance ([place]) 06/26/2018  . CAP (community acquired pneumonia) 06/23/2018  . Bilateral pleural effusion 06/23/2018  . Leukocytosis 06/20/2018  . Chronic anemia 06/20/2018  . Cognitive impairment 06/19/2018  . Bilateral lower extremity edema 06/18/2018  . Protein-calorie malnutrition, severe ([place]) 06/18/2018  . S/P internal fixation right hip fracture cannulated screw placement 04/06/18 04/27/2018  . Urinary tract infection associated with catheterization of urinary tract  ([place]) 04/11/2018  . Altered mental status 04/10/2018  . Anxiety 04/10/2018  . Hyponatremia 04/10/2018  . UTI (urinary tract infection) due to urinary indwelling catheter ([place]) 04/10/2018  . Chronic constipation 04/09/2018  . Urinary retention 04/09/2018  . Closed right hip fracture ([place]) 04/05/2018  . GERD (gastroesophageal reflux disease) 04/05/2018  . COPD with acute exacerbation ([place]) 04/05/2018  . Hiatal hernia   . Dyspepsia 04/04/2014  . Hematochezia 04/30/2011  . RUQ pain 03/25/2011  . Adenomatous polyps 03/25/2011  . ABDOMINAL PAIN, LEFT LOWER QUADRANT 10/16/2009  . HYPERLIPIDEMIA 10/09/2009  . BRONCHITIS 10/09/2009  . Osteoporosis 10/09/2009    Past Surgical History:  Procedure Laterality Date  . COLONOSCOPY  10/2009   sigmoid diverticula, multiple tubulovillous adneomas, needs surveillance Oct 2013  . ESOPHAGOGASTRODUODENOSCOPY  04/16/2011   Dr. Gala Romney: Noncritical Schatzkis ring( not manipulated because no dysphagia). Normal esophagus otherwise  large hiatal hernia. Gastric polyp-status post biopsy. Gastric erosions-staus post biopsy. Abnormal bulb-status post biopsy. benign small bowel, stomach biopsy with ulcerated gastric antral mucosa with foveolar hyperplasia and surface erosion, polyp with inflamed gastric antral mucosa   . ESOPHAGOGASTRODUODENOSCOPY N/A 04/29/2014   Dr. Gala Romney: Noncritical Schatzki's ring large hiatal hernia. Retained gastric contents.GES with slight delay  . HIP PINNING,CANNULATED Right 04/06/2018   Procedure: CANNULATED HIP PINNING;  Surgeon: Carole Civil, MD;  Location: [facility];  Service: Orthopedics;  Laterality: Right;     OB History    Gravida  3   Para  3   Term  3   Preterm  0   AB  0   Living        SAB  0   TAB  0   Ectopic  0   Multiple      Live Births               Home Medications    Prior to Admission medications   Medication Sig Start Date End Date Taking? Authorizing Provider  acetaminophen (TYLENOL)  500 MG tablet Take 500 mg by mouth daily.   Yes [provider]  albuterol (PROAIR HFA) 108 (90 Base) MCG/ACT inhaler Inhale 2 puffs into the lungs every 4 (four) hours as needed. 06/23/18  Yes Gerlene Fee, NP  albuterol (PROVENTIL) (2.5 MG/3ML) 0.083% nebulizer solution Take 3 mLs (2.5 mg total) by nebulization every 6 (six) hours as needed for wheezing or shortness of breath. 06/23/18  Yes Gerlene Fee, NP  ALPRAZolam Duanne Moron) 0.5 MG tablet Take 1 tablet (0.5 mg total) by mouth 3 (three) times daily. Patient taking differently: Take 0.5 mg by mouth 2 (two) times daily.  06/23/18  Yes Gerlene Fee, NP  cetirizine (ZYRTEC) 10 MG tablet Take 10 mg by mouth daily as needed for allergies. 06/16/18  Yes [provider]  diclofenac sodium (VOLTAREN) 1 % GEL Apply 2 g topically 2 (two) times daily.   Yes [provider]  furosemide (LASIX) 40 MG tablet Take 40 mg by mouth 2 (two) times daily.    Yes [provider]  linaclotide (LINZESS) 145 MCG CAPS capsule Take 1 capsule (145 mcg total) by mouth daily as needed. Patient taking differently: Take 145 mcg by mouth daily as needed (for constipation).  06/23/18  Yes Gerlene Fee, NP  Melatonin 1 MG TABS Take 5 mg by mouth daily as needed (sleep).   Yes [provider]  Multiple Vitamin (MULTIVITAMIN WITH MINERALS) TABS tablet Take 1 tablet by mouth daily.   Yes [provider]  pantoprazole (PROTONIX) 40 MG tablet Take 40 mg by mouth 2 (two) times daily.    Yes [provider]  potassium chloride (K-DUR) 10 MEQ tablet Take 1 tablet (10 mEq total) by mouth daily. 06/23/18  Yes Gerlene Fee, NP  tamsulosin (FLOMAX) 0.4 MG CAPS capsule Take 1 capsule (0.4 mg total) by mouth daily. 06/23/18  Yes Gerlene Fee, NP  traMADol (ULTRAM) 50 MG tablet Take 1 tablet (50 mg total) by mouth every 6 (six) hours as needed (pain). Patient taking differently: Take 50 mg by mouth 2 (two) times daily.   07/04/18  Yes [name], MD  umeclidinium-vilanterol [facility] ELLIPTA) 62.5-25 MCG/INH AEPB Inhale 1 puff into the lungs daily. 06/23/18  Yes Gerlene Fee, NP  zolpidem (AMBIEN) 5 MG tablet Take 1 tablet (5 mg total) by mouth at bedtime as needed for up to 13 days for sleep. X 14 days Patient taking differently: Take 5 mg by mouth at bedtime.  06/23/18 10/18/18 Yes Gerlene Fee, NP  azithromycin (ZITHROMAX Z-PAK) 250 MG tablet Take by package instructions Patient not taking: Reported on 10/18/2018 10/02/18   [name], MD  predniSONE (STERAPRED UNI-PAK 21 TAB) 10 MG (21) TBPK tablet Take by package instructions Patient not taking: Reported on 10/18/2018 10/02/18   [name], MD    Family History Family History  Problem Relation Age of Onset  . Stroke Mother   . Diabetes Mother   . Heart disease Father   . Colon cancer Neg Hx     Social History Social  History   Tobacco Use  . Smoking status: Passive Smoke Exposure - Never Smoker  . Smokeless tobacco: Current User    Types: Snuff  Substance Use Topics  . Alcohol use: No  . Drug use: No     Allergies   Codeine, Codeine, Levofloxacin, Sulfonamide derivatives, and Sulfa antibiotics   Review of Systems Review of Systems  Constitutional: Negative for appetite change and fatigue.  HENT: Negative for congestion, ear discharge and sinus pressure.   Eyes: Negative for discharge.  Respiratory: Positive for shortness of breath. Negative for cough.   Cardiovascular: Negative for chest pain.  Gastrointestinal: Negative for abdominal pain and diarrhea.  Genitourinary: Negative for frequency and hematuria.  Musculoskeletal: Negative for back pain.  Skin: Negative for rash.  Neurological: Negative for seizures and headaches.  Psychiatric/Behavioral: Negative for hallucinations.     Physical Exam Updated Vital Signs BP 111/72 (BP Location: Right Arm)   Pulse 88   Temp 97.7 F (36.5 C) (Oral)   Resp 18   Ht  5\' 7"  (1.702 m)   Wt 55.5 kg   SpO2 95%   BMI 19.16 kg/m   Physical Exam Vitals signs and nursing note reviewed.  Constitutional:      Appearance: She is well-developed.  HENT:     Head: Normocephalic.     Nose: Nose normal.  Eyes:     General: No scleral icterus.    Conjunctiva/sclera: Conjunctivae normal.  Neck:     Musculoskeletal: Neck supple.     Thyroid: No thyromegaly.  Cardiovascular:     Rate and Rhythm: Normal rate and regular rhythm.     Heart sounds: No murmur. No friction rub. No gallop.   Pulmonary:     Breath sounds: No stridor. No wheezing or rales.  Chest:     Chest wall: No tenderness.  Abdominal:     General: There is no distension.     Tenderness: There is no abdominal tenderness. There is no rebound.  Musculoskeletal: Normal range of motion.  Lymphadenopathy:     Cervical: No cervical adenopathy.  Skin:    Findings: No erythema or rash.  Neurological:     Mental Status: She is alert and oriented to person, place, and time.     Motor: No abnormal muscle tone.     Coordination: Coordination normal.  Psychiatric:        Behavior: Behavior normal.      ED Treatments / Results  Labs (all labs ordered are listed, but only abnormal results are displayed) Labs Reviewed  CBC WITH DIFFERENTIAL/PLATELET - Abnormal; Notable for the following components:      Result Value   Hemoglobin 9.1 (*)    HCT 32.4 (*)    MCH 23.5 (*)    MCHC 28.1 (*)    RDW 17.4 (*)    All other components within normal limits  COMPREHENSIVE METABOLIC PANEL - Abnormal; Notable for the following components:   Calcium 8.5 (*)    Albumin 2.9 (*)    Total Bilirubin 0.2 (*)    GFR calc non Af Amer 54 (*)    All other components within normal limits  SARS CORONAVIRUS 2 (TAT 6-24 HRS)  BRAIN NATRIURETIC PEPTIDE    EKG None  Radiology Dg Chest Portable 1 View  Result Date: 10/18/2018 CLINICAL DATA:  Dyspnea EXAM: PORTABLE CHEST 1 VIEW COMPARISON:  09/29/2018 chest  radiograph. FINDINGS: Stable cardiomediastinal silhouette with mild cardiomegaly and large hiatal hernia. No pneumothorax. No pleural effusion. Diffuse parahilar  hazy and linear opacities. IMPRESSION: 1. Stable mild cardiomegaly. Diffuse parahilar hazy and linear opacities, favor pulmonary edema due to congestive heart failure. 2. Large hiatal hernia. Electronically Signed   By: Ilona Sorrel M.D.   On: 10/18/2018 21:18    Procedures Procedures (including critical care time)  Medications Ordered in ED Medications  furosemide (LASIX) tablet 40 mg (has no administration in time range)  HYDROcodone-acetaminophen (NORCO/VICODIN) 5-325 MG per tablet 1 tablet (1 tablet Oral Given 10/18/18 2134)     Initial Impression / Assessment and Plan / ED Course  I have reviewed the triage vital signs and the nursing notes.  Pertinent labs & imaging results that were available during my care of the patient were reviewed by me and considered in my medical decision making (see chart for details).       Chest x-ray shows no pneumonia.  Patient has congestive heart failure.  Sats normal patient not dyspneic.  We will give her an extra Lasix and she will follow-up with her doctor  Final Clinical Impressions(s) / ED Diagnoses   Final diagnoses:  Systolic congestive heart failure, unspecified HF chronicity [facility])    ED Discharge Orders    None       Milton Ferguson, MD 10/18/18 2215

## 2018-10-18 NOTE — Discharge Instructions (Addendum)
Follow-up with your doctor next week.  You do not have pneumonia

## 2018-10-18 NOTE — ED Notes (Signed)
[name] daughter states pt lives with her and does not want pt sister, vickey bray, to have pt's info

## 2018-10-19 DIAGNOSIS — M545 Low back pain: Secondary | ICD-10-CM | POA: Diagnosis not present

## 2018-10-19 DIAGNOSIS — F419 Anxiety disorder, unspecified: Secondary | ICD-10-CM | POA: Diagnosis not present

## 2018-10-19 DIAGNOSIS — I5033 Acute on chronic diastolic (congestive) heart failure: Secondary | ICD-10-CM | POA: Diagnosis not present

## 2018-10-19 DIAGNOSIS — J441 Chronic obstructive pulmonary disease with (acute) exacerbation: Secondary | ICD-10-CM | POA: Diagnosis not present

## 2018-10-19 LAB — SARS CORONAVIRUS 2 (TAT 6-24 HRS): SARS Coronavirus 2: NEGATIVE

## 2018-10-23 DIAGNOSIS — N390 Urinary tract infection, site not specified: Secondary | ICD-10-CM | POA: Diagnosis not present

## 2018-10-23 DIAGNOSIS — I5022 Chronic systolic (congestive) heart failure: Secondary | ICD-10-CM | POA: Diagnosis not present

## 2018-10-23 DIAGNOSIS — F0150 Vascular dementia without behavioral disturbance: Secondary | ICD-10-CM | POA: Diagnosis not present

## 2018-10-23 DIAGNOSIS — M810 Age-related osteoporosis without current pathological fracture: Secondary | ICD-10-CM | POA: Diagnosis not present

## 2018-10-23 DIAGNOSIS — L89623 Pressure ulcer of left heel, stage 3: Secondary | ICD-10-CM | POA: Diagnosis not present

## 2018-10-23 DIAGNOSIS — J449 Chronic obstructive pulmonary disease, unspecified: Secondary | ICD-10-CM | POA: Diagnosis not present

## 2018-10-25 DIAGNOSIS — Z79891 Long term (current) use of opiate analgesic: Secondary | ICD-10-CM | POA: Diagnosis not present

## 2018-10-25 DIAGNOSIS — E785 Hyperlipidemia, unspecified: Secondary | ICD-10-CM | POA: Diagnosis not present

## 2018-10-25 DIAGNOSIS — I5022 Chronic systolic (congestive) heart failure: Secondary | ICD-10-CM | POA: Diagnosis not present

## 2018-10-25 DIAGNOSIS — Z7722 Contact with and (suspected) exposure to environmental tobacco smoke (acute) (chronic): Secondary | ICD-10-CM | POA: Diagnosis not present

## 2018-10-25 DIAGNOSIS — K589 Irritable bowel syndrome without diarrhea: Secondary | ICD-10-CM | POA: Diagnosis not present

## 2018-10-25 DIAGNOSIS — J189 Pneumonia, unspecified organism: Secondary | ICD-10-CM | POA: Diagnosis not present

## 2018-10-25 DIAGNOSIS — L89623 Pressure ulcer of left heel, stage 3: Secondary | ICD-10-CM | POA: Diagnosis not present

## 2018-10-25 DIAGNOSIS — K219 Gastro-esophageal reflux disease without esophagitis: Secondary | ICD-10-CM | POA: Diagnosis not present

## 2018-10-25 DIAGNOSIS — D649 Anemia, unspecified: Secondary | ICD-10-CM | POA: Diagnosis not present

## 2018-10-25 DIAGNOSIS — F0150 Vascular dementia without behavioral disturbance: Secondary | ICD-10-CM | POA: Diagnosis not present

## 2018-10-25 DIAGNOSIS — E43 Unspecified severe protein-calorie malnutrition: Secondary | ICD-10-CM | POA: Diagnosis not present

## 2018-10-25 DIAGNOSIS — J441 Chronic obstructive pulmonary disease with (acute) exacerbation: Secondary | ICD-10-CM | POA: Diagnosis not present

## 2018-10-25 DIAGNOSIS — Z7952 Long term (current) use of systemic steroids: Secondary | ICD-10-CM | POA: Diagnosis not present

## 2018-10-25 DIAGNOSIS — F419 Anxiety disorder, unspecified: Secondary | ICD-10-CM | POA: Diagnosis not present

## 2018-10-25 DIAGNOSIS — Z9181 History of falling: Secondary | ICD-10-CM | POA: Diagnosis not present

## 2018-10-25 DIAGNOSIS — R339 Retention of urine, unspecified: Secondary | ICD-10-CM | POA: Diagnosis not present

## 2018-10-25 DIAGNOSIS — I5033 Acute on chronic diastolic (congestive) heart failure: Secondary | ICD-10-CM | POA: Diagnosis not present

## 2018-10-25 DIAGNOSIS — F329 Major depressive disorder, single episode, unspecified: Secondary | ICD-10-CM | POA: Diagnosis not present

## 2018-10-25 DIAGNOSIS — J440 Chronic obstructive pulmonary disease with acute lower respiratory infection: Secondary | ICD-10-CM | POA: Diagnosis not present

## 2018-10-25 DIAGNOSIS — M810 Age-related osteoporosis without current pathological fracture: Secondary | ICD-10-CM | POA: Diagnosis not present

## 2018-10-26 DIAGNOSIS — I5033 Acute on chronic diastolic (congestive) heart failure: Secondary | ICD-10-CM | POA: Diagnosis not present

## 2018-10-26 DIAGNOSIS — L89623 Pressure ulcer of left heel, stage 3: Secondary | ICD-10-CM | POA: Diagnosis not present

## 2018-10-26 DIAGNOSIS — J440 Chronic obstructive pulmonary disease with acute lower respiratory infection: Secondary | ICD-10-CM | POA: Diagnosis not present

## 2018-10-26 DIAGNOSIS — J189 Pneumonia, unspecified organism: Secondary | ICD-10-CM | POA: Diagnosis not present

## 2018-10-26 DIAGNOSIS — J441 Chronic obstructive pulmonary disease with (acute) exacerbation: Secondary | ICD-10-CM | POA: Diagnosis not present

## 2018-10-26 DIAGNOSIS — I5022 Chronic systolic (congestive) heart failure: Secondary | ICD-10-CM | POA: Diagnosis not present

## 2018-10-31 DIAGNOSIS — J189 Pneumonia, unspecified organism: Secondary | ICD-10-CM | POA: Diagnosis not present

## 2018-10-31 DIAGNOSIS — J441 Chronic obstructive pulmonary disease with (acute) exacerbation: Secondary | ICD-10-CM | POA: Diagnosis not present

## 2018-10-31 DIAGNOSIS — J440 Chronic obstructive pulmonary disease with acute lower respiratory infection: Secondary | ICD-10-CM | POA: Diagnosis not present

## 2018-10-31 DIAGNOSIS — L89623 Pressure ulcer of left heel, stage 3: Secondary | ICD-10-CM | POA: Diagnosis not present

## 2018-10-31 DIAGNOSIS — I5022 Chronic systolic (congestive) heart failure: Secondary | ICD-10-CM | POA: Diagnosis not present

## 2018-10-31 DIAGNOSIS — I5033 Acute on chronic diastolic (congestive) heart failure: Secondary | ICD-10-CM | POA: Diagnosis not present

## 2018-11-06 DIAGNOSIS — J440 Chronic obstructive pulmonary disease with acute lower respiratory infection: Secondary | ICD-10-CM | POA: Diagnosis not present

## 2018-11-06 DIAGNOSIS — I5033 Acute on chronic diastolic (congestive) heart failure: Secondary | ICD-10-CM | POA: Diagnosis not present

## 2018-11-06 DIAGNOSIS — J441 Chronic obstructive pulmonary disease with (acute) exacerbation: Secondary | ICD-10-CM | POA: Diagnosis not present

## 2018-11-06 DIAGNOSIS — L89623 Pressure ulcer of left heel, stage 3: Secondary | ICD-10-CM | POA: Diagnosis not present

## 2018-11-06 DIAGNOSIS — I5022 Chronic systolic (congestive) heart failure: Secondary | ICD-10-CM | POA: Diagnosis not present

## 2018-11-06 DIAGNOSIS — J189 Pneumonia, unspecified organism: Secondary | ICD-10-CM | POA: Diagnosis not present

## 2018-11-14 DIAGNOSIS — L89623 Pressure ulcer of left heel, stage 3: Secondary | ICD-10-CM | POA: Diagnosis not present

## 2018-11-14 DIAGNOSIS — I5022 Chronic systolic (congestive) heart failure: Secondary | ICD-10-CM | POA: Diagnosis not present

## 2018-11-14 DIAGNOSIS — J440 Chronic obstructive pulmonary disease with acute lower respiratory infection: Secondary | ICD-10-CM | POA: Diagnosis not present

## 2018-11-14 DIAGNOSIS — I5033 Acute on chronic diastolic (congestive) heart failure: Secondary | ICD-10-CM | POA: Diagnosis not present

## 2018-11-14 DIAGNOSIS — J189 Pneumonia, unspecified organism: Secondary | ICD-10-CM | POA: Diagnosis not present

## 2018-11-14 DIAGNOSIS — J441 Chronic obstructive pulmonary disease with (acute) exacerbation: Secondary | ICD-10-CM | POA: Diagnosis not present

## 2018-11-16 DIAGNOSIS — R079 Chest pain, unspecified: Secondary | ICD-10-CM | POA: Diagnosis not present

## 2018-11-21 DIAGNOSIS — I5033 Acute on chronic diastolic (congestive) heart failure: Secondary | ICD-10-CM | POA: Diagnosis not present

## 2018-11-21 DIAGNOSIS — J189 Pneumonia, unspecified organism: Secondary | ICD-10-CM | POA: Diagnosis not present

## 2018-11-21 DIAGNOSIS — I5022 Chronic systolic (congestive) heart failure: Secondary | ICD-10-CM | POA: Diagnosis not present

## 2018-11-21 DIAGNOSIS — J440 Chronic obstructive pulmonary disease with acute lower respiratory infection: Secondary | ICD-10-CM | POA: Diagnosis not present

## 2018-11-21 DIAGNOSIS — J441 Chronic obstructive pulmonary disease with (acute) exacerbation: Secondary | ICD-10-CM | POA: Diagnosis not present

## 2018-11-21 DIAGNOSIS — L89623 Pressure ulcer of left heel, stage 3: Secondary | ICD-10-CM | POA: Diagnosis not present

## 2018-11-23 DIAGNOSIS — J449 Chronic obstructive pulmonary disease, unspecified: Secondary | ICD-10-CM | POA: Diagnosis not present

## 2018-11-23 DIAGNOSIS — G4700 Insomnia, unspecified: Secondary | ICD-10-CM | POA: Diagnosis not present

## 2018-11-23 DIAGNOSIS — I5033 Acute on chronic diastolic (congestive) heart failure: Secondary | ICD-10-CM | POA: Diagnosis not present

## 2018-11-23 DIAGNOSIS — F419 Anxiety disorder, unspecified: Secondary | ICD-10-CM | POA: Diagnosis not present

## 2018-11-24 DIAGNOSIS — Z8701 Personal history of pneumonia (recurrent): Secondary | ICD-10-CM | POA: Diagnosis not present

## 2018-11-24 DIAGNOSIS — R339 Retention of urine, unspecified: Secondary | ICD-10-CM | POA: Diagnosis not present

## 2018-11-24 DIAGNOSIS — L89623 Pressure ulcer of left heel, stage 3: Secondary | ICD-10-CM | POA: Diagnosis not present

## 2018-11-24 DIAGNOSIS — K219 Gastro-esophageal reflux disease without esophagitis: Secondary | ICD-10-CM | POA: Diagnosis not present

## 2018-11-24 DIAGNOSIS — E43 Unspecified severe protein-calorie malnutrition: Secondary | ICD-10-CM | POA: Diagnosis not present

## 2018-11-24 DIAGNOSIS — Z7722 Contact with and (suspected) exposure to environmental tobacco smoke (acute) (chronic): Secondary | ICD-10-CM | POA: Diagnosis not present

## 2018-11-24 DIAGNOSIS — F329 Major depressive disorder, single episode, unspecified: Secondary | ICD-10-CM | POA: Diagnosis not present

## 2018-11-24 DIAGNOSIS — Z87311 Personal history of (healed) other pathological fracture: Secondary | ICD-10-CM | POA: Diagnosis not present

## 2018-11-24 DIAGNOSIS — F0150 Vascular dementia without behavioral disturbance: Secondary | ICD-10-CM | POA: Diagnosis not present

## 2018-11-24 DIAGNOSIS — F419 Anxiety disorder, unspecified: Secondary | ICD-10-CM | POA: Diagnosis not present

## 2018-11-24 DIAGNOSIS — I5033 Acute on chronic diastolic (congestive) heart failure: Secondary | ICD-10-CM | POA: Diagnosis not present

## 2018-11-24 DIAGNOSIS — E785 Hyperlipidemia, unspecified: Secondary | ICD-10-CM | POA: Diagnosis not present

## 2018-11-24 DIAGNOSIS — D649 Anemia, unspecified: Secondary | ICD-10-CM | POA: Diagnosis not present

## 2018-11-24 DIAGNOSIS — M810 Age-related osteoporosis without current pathological fracture: Secondary | ICD-10-CM | POA: Diagnosis not present

## 2018-11-24 DIAGNOSIS — K589 Irritable bowel syndrome without diarrhea: Secondary | ICD-10-CM | POA: Diagnosis not present

## 2018-11-24 DIAGNOSIS — Z9181 History of falling: Secondary | ICD-10-CM | POA: Diagnosis not present

## 2018-11-24 DIAGNOSIS — Z79891 Long term (current) use of opiate analgesic: Secondary | ICD-10-CM | POA: Diagnosis not present

## 2018-11-24 DIAGNOSIS — I5022 Chronic systolic (congestive) heart failure: Secondary | ICD-10-CM | POA: Diagnosis not present

## 2018-11-24 DIAGNOSIS — J441 Chronic obstructive pulmonary disease with (acute) exacerbation: Secondary | ICD-10-CM | POA: Diagnosis not present

## 2018-11-28 DIAGNOSIS — I5022 Chronic systolic (congestive) heart failure: Secondary | ICD-10-CM | POA: Diagnosis not present

## 2018-11-28 DIAGNOSIS — I5033 Acute on chronic diastolic (congestive) heart failure: Secondary | ICD-10-CM | POA: Diagnosis not present

## 2018-11-28 DIAGNOSIS — F0150 Vascular dementia without behavioral disturbance: Secondary | ICD-10-CM | POA: Diagnosis not present

## 2018-11-28 DIAGNOSIS — L89623 Pressure ulcer of left heel, stage 3: Secondary | ICD-10-CM | POA: Diagnosis not present

## 2018-11-28 DIAGNOSIS — J441 Chronic obstructive pulmonary disease with (acute) exacerbation: Secondary | ICD-10-CM | POA: Diagnosis not present

## 2018-11-28 DIAGNOSIS — M810 Age-related osteoporosis without current pathological fracture: Secondary | ICD-10-CM | POA: Diagnosis not present

## 2018-12-04 DIAGNOSIS — J441 Chronic obstructive pulmonary disease with (acute) exacerbation: Secondary | ICD-10-CM | POA: Diagnosis not present

## 2018-12-04 DIAGNOSIS — I5033 Acute on chronic diastolic (congestive) heart failure: Secondary | ICD-10-CM | POA: Diagnosis not present

## 2018-12-04 DIAGNOSIS — M810 Age-related osteoporosis without current pathological fracture: Secondary | ICD-10-CM | POA: Diagnosis not present

## 2018-12-04 DIAGNOSIS — L89623 Pressure ulcer of left heel, stage 3: Secondary | ICD-10-CM | POA: Diagnosis not present

## 2018-12-04 DIAGNOSIS — I5022 Chronic systolic (congestive) heart failure: Secondary | ICD-10-CM | POA: Diagnosis not present

## 2018-12-04 DIAGNOSIS — F0150 Vascular dementia without behavioral disturbance: Secondary | ICD-10-CM | POA: Diagnosis not present

## 2018-12-11 ENCOUNTER — Ambulatory Visit: Payer: Medicare Other

## 2018-12-11 ENCOUNTER — Other Ambulatory Visit: Payer: Self-pay

## 2018-12-11 ENCOUNTER — Ambulatory Visit (INDEPENDENT_AMBULATORY_CARE_PROVIDER_SITE_OTHER): Payer: Medicare Other | Admitting: Orthopedic Surgery

## 2018-12-11 VITALS — BP 145/95 | HR 113 | Temp 97.1°F | Ht 63.0 in | Wt 116.0 lb

## 2018-12-11 DIAGNOSIS — M545 Low back pain, unspecified: Secondary | ICD-10-CM

## 2018-12-11 DIAGNOSIS — M25561 Pain in right knee: Secondary | ICD-10-CM | POA: Diagnosis not present

## 2018-12-11 DIAGNOSIS — I5033 Acute on chronic diastolic (congestive) heart failure: Secondary | ICD-10-CM | POA: Diagnosis not present

## 2018-12-11 DIAGNOSIS — L89623 Pressure ulcer of left heel, stage 3: Secondary | ICD-10-CM | POA: Diagnosis not present

## 2018-12-11 DIAGNOSIS — G8929 Other chronic pain: Secondary | ICD-10-CM | POA: Diagnosis not present

## 2018-12-11 DIAGNOSIS — M810 Age-related osteoporosis without current pathological fracture: Secondary | ICD-10-CM | POA: Diagnosis not present

## 2018-12-11 DIAGNOSIS — J441 Chronic obstructive pulmonary disease with (acute) exacerbation: Secondary | ICD-10-CM | POA: Diagnosis not present

## 2018-12-11 DIAGNOSIS — F0150 Vascular dementia without behavioral disturbance: Secondary | ICD-10-CM | POA: Diagnosis not present

## 2018-12-11 DIAGNOSIS — I5022 Chronic systolic (congestive) heart failure: Secondary | ICD-10-CM | POA: Diagnosis not present

## 2018-12-11 MED ORDER — MELOXICAM 7.5 MG PO TABS: 7.5000 mg | ORAL_TABLET | Freq: Every day | ORAL | 5 refills | Status: DC

## 2018-12-11 NOTE — Patient Instructions (Addendum)
Back Pain : Secondary to arthritis and osteoporosis : you need to be on Vit D  5000 iu per day/  Unfortunately this is a medical problem not surgical. Your Dr can put you on the proper medication, continue the Prolia and start meloxicam once a day for back pain arthritis  Knee pain:  You have received an injection of steroids into the joint. 15% of patients will have increased pain within the 24 hours postinjection.   This is transient and will go away.   We recommend that you use ice packs on the injection site for 20 minutes every 2 hours and extra strength Tylenol 2 tablets every 8 as needed until the pain resolves.  If you continue to have pain after taking the Tylenol and using the ice please call the office for further instructions.

## 2018-12-11 NOTE — Progress Notes (Signed)
[name]  12/11/2018  Body mass index is 20.55 kg/m.   HISTORY SECTION :  Chief Complaint  Patient presents with  . Back Pain    Back pain  . Knee Pain    right    84 year old female presents with back pain and right knee pain  She has a history of osteoporosis she has been on Prolia as late as 2017 she presents complaining that her bones hurt and she is also having dull achy pain moderate right knee  No prior treatment other than some tramadol    Review of Systems  Respiratory: Positive for wheezing.   Cardiovascular: Positive for leg swelling.  Gastrointestinal: Positive for heartburn.  Musculoskeletal: Positive for back pain, joint pain and myalgias.  All other systems reviewed and are negative.    has a past medical history of Anxiety, CHF (congestive heart failure) ([place]), COPD (chronic obstructive pulmonary disease) ([place]), GERD (gastroesophageal reflux disease), Hyperlipidemia, IBS (irritable bowel syndrome), Osteoporosis, and Pneumonia.   Past Surgical History:  Procedure Laterality Date  . COLONOSCOPY  10/2009   sigmoid diverticula, multiple tubulovillous adneomas, needs surveillance Oct 2013  . ESOPHAGOGASTRODUODENOSCOPY  04/16/2011   Dr. Gala Romney: Noncritical Schatzkis ring( not manipulated because no dysphagia). Normal esophagus otherwise  large hiatal hernia. Gastric polyp-status post biopsy. Gastric erosions-staus post biopsy. Abnormal bulb-status post biopsy. benign small bowel, stomach biopsy with ulcerated gastric antral mucosa with foveolar hyperplasia and surface erosion, polyp with inflamed gastric antral mucosa   . ESOPHAGOGASTRODUODENOSCOPY N/A 04/29/2014   Dr. Gala Romney: Noncritical Schatzki's ring large hiatal hernia. Retained gastric contents.GES with slight delay  . HIP PINNING,CANNULATED Right 04/06/2018   Procedure: CANNULATED HIP PINNING;  Surgeon: Carole Civil, MD;  Location: [facility];  Service: Orthopedics;  Laterality: Right;    Body mass  index is 20.55 kg/m.   Allergies  Allergen Reactions  . Codeine Shortness Of Breath and Rash  . Codeine Anaphylaxis  . Levofloxacin Anaphylaxis    Patient was admitted to hospital with lung problems due to this medication  . Sulfonamide Derivatives Hives and Shortness Of Breath  . Sulfa Antibiotics Hives     Current Outpatient Medications:  .  acetaminophen (TYLENOL) 500 MG tablet, Take 500 mg by mouth daily., Disp: , Rfl:  .  albuterol (PROAIR HFA) 108 (90 Base) MCG/ACT inhaler, Inhale 2 puffs into the lungs every 4 (four) hours as needed., Disp: 8 g, Rfl: 0 .  albuterol (PROVENTIL) (2.5 MG/3ML) 0.083% nebulizer solution, Take 3 mLs (2.5 mg total) by nebulization every 6 (six) hours as needed for wheezing or shortness of breath., Disp: 75 mL, Rfl: 0 .  ALPRAZolam (XANAX) 0.5 MG tablet, Take 1 tablet (0.5 mg total) by mouth 3 (three) times daily. (Patient taking differently: Take 0.5 mg by mouth 2 (two) times daily. ), Disp: 45 tablet, Rfl: 0 .  cetirizine (ZYRTEC) 10 MG tablet, Take 10 mg by mouth daily as needed for allergies., Disp: , Rfl:  .  denosumab (PROLIA) 60 MG/ML SOSY injection, Inject 60 mg into the skin every 6 (six) months., Disp: , Rfl:  .  diclofenac sodium (VOLTAREN) 1 % GEL, Apply 2 g topically 2 (two) times daily., Disp: , Rfl:  .  furosemide (LASIX) 40 MG tablet, Take 40 mg by mouth 2 (two) times daily. , Disp: , Rfl:  .  linaclotide (LINZESS) 145 MCG CAPS capsule, Take 1 capsule (145 mcg total) by mouth daily as needed. (Patient taking differently: Take 145 mcg by mouth daily as  needed (for constipation). ), Disp: 30 capsule, Rfl: 0 .  Melatonin 1 MG TABS, Take 5 mg by mouth daily as needed (sleep)., Disp: , Rfl:  .  Multiple Vitamin (MULTIVITAMIN WITH MINERALS) TABS tablet, Take 1 tablet by mouth daily., Disp: , Rfl:  .  pantoprazole (PROTONIX) 40 MG tablet, Take 40 mg by mouth 2 (two) times daily. , Disp: , Rfl:  .  potassium chloride (K-DUR) 10 MEQ tablet, Take 1  tablet (10 mEq total) by mouth daily., Disp: 30 tablet, Rfl: 0 .  tamsulosin (FLOMAX) 0.4 MG CAPS capsule, Take 1 capsule (0.4 mg total) by mouth daily., Disp: 30 capsule, Rfl: 0 .  traMADol (ULTRAM) 50 MG tablet, Take 1 tablet (50 mg total) by mouth every 6 (six) hours as needed (pain). (Patient taking differently: Take 50 mg by mouth 2 (two) times daily. ), Disp: 30 tablet, Rfl: 0 .  umeclidinium-vilanterol (ANORO ELLIPTA) 62.5-25 MCG/INH AEPB, Inhale 1 puff into the lungs daily., Disp: 60 each, Rfl: 0 .  meloxicam (MOBIC) 7.5 MG tablet, Take 1 tablet (7.5 mg total) by mouth daily., Disp: 30 tablet, Rfl: 5 .  zolpidem (AMBIEN) 5 MG tablet, Take 1 tablet (5 mg total) by mouth at bedtime as needed for up to 13 days for sleep. X 14 days (Patient taking differently: Take 5 mg by mouth at bedtime. ), Disp: 5 tablet, Rfl: 0   PHYSICAL EXAM SECTION: 1) BP (!) 145/95   Pulse (!) 113   Temp (!) 97.1 F (36.2 C)   Ht 5\' 3"  (1.6 m)   Wt 116 lb (52.6 kg)   BMI 20.55 kg/m   Body mass index is 20.55 kg/m. General appearance: Well-developed well-nourished no gross deformities  2) Cardiovascular normal pulse and perfusion in the lower  extremities normal color without edema  3) Neurologically deep tendon reflexes are equal and normal, no sensation loss or deficits no pathologic reflexes  4) Psychological: Awake alert and oriented x3 mood and affect normal  5) Skin no lacerations or ulcerations no nodularity no palpable masses, no erythema or nodularity  6) Musculoskeletal: back  Severe upper thoracic kyphosis  Lower back pain and tenderness  Right and left lower extremity Motor function 5 no atrophy No reflex changes Negative straight leg raises  Right knee pain with tenderness normal alignment Effusion none  Tenderness healed joint line  Range of motion 120 degrees Ligaments stable  Muscle tone strength normal   MEDICAL DECISION SECTION:  Encounter Diagnoses  Name Primary?  .  Lumbar pain Yes  . Chronic pain of right knee     Imaging Knee  See report  Lumbar see report  Summation right knee had mild arthritis of the medial compartment with no secondary bone changes lumbar spine showed severe increased lordosis at L5 5 S1 with severe kyphosis this is also confirmed on her prior chest film taken in June  Plan:  (Rx., Inj., surg., Frx, MRI/CT, XR:2)   Inject right knee  Procedure note right knee injection   verbal consent was obtained to inject right knee joint  Timeout was completed to confirm the site of injection  The medications used were 40 mg of Depo-Medrol and 1% lidocaine 3 cc  Anesthesia was provided by ethyl chloride and the skin was prepped with alcohol.  After cleaning the skin with alcohol a 20-gauge needle was used to inject the right knee joint. There were no complications. A sterile bandage was applied.   Recommend medical management for osteoporosis  Meds ordered this encounter  Medications  . meloxicam (MOBIC) 7.5 MG tablet    Sig: Take 1 tablet (7.5 mg total) by mouth daily.    Dispense:  30 tablet    Refill:  5    Follow-up as needed  2:56 PM Arther Abbott, MD  12/11/2018

## 2018-12-19 DIAGNOSIS — I5033 Acute on chronic diastolic (congestive) heart failure: Secondary | ICD-10-CM | POA: Diagnosis not present

## 2018-12-19 DIAGNOSIS — I5022 Chronic systolic (congestive) heart failure: Secondary | ICD-10-CM | POA: Diagnosis not present

## 2018-12-19 DIAGNOSIS — F0150 Vascular dementia without behavioral disturbance: Secondary | ICD-10-CM | POA: Diagnosis not present

## 2018-12-19 DIAGNOSIS — L89623 Pressure ulcer of left heel, stage 3: Secondary | ICD-10-CM | POA: Diagnosis not present

## 2018-12-19 DIAGNOSIS — M810 Age-related osteoporosis without current pathological fracture: Secondary | ICD-10-CM | POA: Diagnosis not present

## 2018-12-19 DIAGNOSIS — J441 Chronic obstructive pulmonary disease with (acute) exacerbation: Secondary | ICD-10-CM | POA: Diagnosis not present

## 2018-12-24 DIAGNOSIS — Z8701 Personal history of pneumonia (recurrent): Secondary | ICD-10-CM | POA: Diagnosis not present

## 2018-12-24 DIAGNOSIS — F0150 Vascular dementia without behavioral disturbance: Secondary | ICD-10-CM | POA: Diagnosis not present

## 2018-12-24 DIAGNOSIS — K589 Irritable bowel syndrome without diarrhea: Secondary | ICD-10-CM | POA: Diagnosis not present

## 2018-12-24 DIAGNOSIS — J441 Chronic obstructive pulmonary disease with (acute) exacerbation: Secondary | ICD-10-CM | POA: Diagnosis not present

## 2018-12-24 DIAGNOSIS — Z87311 Personal history of (healed) other pathological fracture: Secondary | ICD-10-CM | POA: Diagnosis not present

## 2018-12-24 DIAGNOSIS — L89623 Pressure ulcer of left heel, stage 3: Secondary | ICD-10-CM | POA: Diagnosis not present

## 2018-12-24 DIAGNOSIS — M810 Age-related osteoporosis without current pathological fracture: Secondary | ICD-10-CM | POA: Diagnosis not present

## 2018-12-24 DIAGNOSIS — D649 Anemia, unspecified: Secondary | ICD-10-CM | POA: Diagnosis not present

## 2018-12-24 DIAGNOSIS — E43 Unspecified severe protein-calorie malnutrition: Secondary | ICD-10-CM | POA: Diagnosis not present

## 2018-12-24 DIAGNOSIS — I5022 Chronic systolic (congestive) heart failure: Secondary | ICD-10-CM | POA: Diagnosis not present

## 2018-12-24 DIAGNOSIS — F329 Major depressive disorder, single episode, unspecified: Secondary | ICD-10-CM | POA: Diagnosis not present

## 2018-12-24 DIAGNOSIS — F419 Anxiety disorder, unspecified: Secondary | ICD-10-CM | POA: Diagnosis not present

## 2018-12-24 DIAGNOSIS — Z79891 Long term (current) use of opiate analgesic: Secondary | ICD-10-CM | POA: Diagnosis not present

## 2018-12-24 DIAGNOSIS — Z7722 Contact with and (suspected) exposure to environmental tobacco smoke (acute) (chronic): Secondary | ICD-10-CM | POA: Diagnosis not present

## 2018-12-24 DIAGNOSIS — I5033 Acute on chronic diastolic (congestive) heart failure: Secondary | ICD-10-CM | POA: Diagnosis not present

## 2018-12-24 DIAGNOSIS — E785 Hyperlipidemia, unspecified: Secondary | ICD-10-CM | POA: Diagnosis not present

## 2018-12-24 DIAGNOSIS — R339 Retention of urine, unspecified: Secondary | ICD-10-CM | POA: Diagnosis not present

## 2018-12-24 DIAGNOSIS — Z9181 History of falling: Secondary | ICD-10-CM | POA: Diagnosis not present

## 2018-12-24 DIAGNOSIS — K219 Gastro-esophageal reflux disease without esophagitis: Secondary | ICD-10-CM | POA: Diagnosis not present

## 2018-12-25 DIAGNOSIS — L89623 Pressure ulcer of left heel, stage 3: Secondary | ICD-10-CM | POA: Diagnosis not present

## 2018-12-25 DIAGNOSIS — F0150 Vascular dementia without behavioral disturbance: Secondary | ICD-10-CM | POA: Diagnosis not present

## 2018-12-25 DIAGNOSIS — M810 Age-related osteoporosis without current pathological fracture: Secondary | ICD-10-CM | POA: Diagnosis not present

## 2018-12-25 DIAGNOSIS — I5033 Acute on chronic diastolic (congestive) heart failure: Secondary | ICD-10-CM | POA: Diagnosis not present

## 2018-12-25 DIAGNOSIS — J441 Chronic obstructive pulmonary disease with (acute) exacerbation: Secondary | ICD-10-CM | POA: Diagnosis not present

## 2018-12-25 DIAGNOSIS — I5022 Chronic systolic (congestive) heart failure: Secondary | ICD-10-CM | POA: Diagnosis not present

## 2019-01-01 DIAGNOSIS — I5032 Chronic diastolic (congestive) heart failure: Secondary | ICD-10-CM | POA: Insufficient documentation

## 2019-01-01 DIAGNOSIS — M79643 Pain in unspecified hand: Secondary | ICD-10-CM | POA: Insufficient documentation

## 2019-01-01 DIAGNOSIS — F419 Anxiety disorder, unspecified: Secondary | ICD-10-CM

## 2019-01-01 DIAGNOSIS — J449 Chronic obstructive pulmonary disease, unspecified: Secondary | ICD-10-CM | POA: Insufficient documentation

## 2019-01-01 DIAGNOSIS — M810 Age-related osteoporosis without current pathological fracture: Secondary | ICD-10-CM | POA: Insufficient documentation

## 2019-01-01 DIAGNOSIS — R079 Chest pain, unspecified: Secondary | ICD-10-CM | POA: Insufficient documentation

## 2019-01-01 DIAGNOSIS — M4850XD Collapsed vertebra, not elsewhere classified, site unspecified, subsequent encounter for fracture with routine healing: Secondary | ICD-10-CM

## 2019-01-01 DIAGNOSIS — J8410 Pulmonary fibrosis, unspecified: Secondary | ICD-10-CM

## 2019-01-01 DIAGNOSIS — L8990 Pressure ulcer of unspecified site, unspecified stage: Secondary | ICD-10-CM

## 2019-01-01 DIAGNOSIS — N3281 Overactive bladder: Secondary | ICD-10-CM | POA: Insufficient documentation

## 2019-01-01 DIAGNOSIS — M5440 Lumbago with sciatica, unspecified side: Secondary | ICD-10-CM | POA: Insufficient documentation

## 2019-01-01 DIAGNOSIS — K219 Gastro-esophageal reflux disease without esophagitis: Secondary | ICD-10-CM

## 2019-01-01 DIAGNOSIS — I5033 Acute on chronic diastolic (congestive) heart failure: Secondary | ICD-10-CM | POA: Insufficient documentation

## 2019-01-01 DIAGNOSIS — E46 Unspecified protein-calorie malnutrition: Secondary | ICD-10-CM

## 2019-01-01 DIAGNOSIS — E785 Hyperlipidemia, unspecified: Secondary | ICD-10-CM

## 2019-01-01 DIAGNOSIS — R609 Edema, unspecified: Secondary | ICD-10-CM | POA: Insufficient documentation

## 2019-01-02 DIAGNOSIS — J441 Chronic obstructive pulmonary disease with (acute) exacerbation: Secondary | ICD-10-CM | POA: Diagnosis not present

## 2019-01-02 DIAGNOSIS — L89623 Pressure ulcer of left heel, stage 3: Secondary | ICD-10-CM | POA: Diagnosis not present

## 2019-01-02 DIAGNOSIS — I5022 Chronic systolic (congestive) heart failure: Secondary | ICD-10-CM | POA: Diagnosis not present

## 2019-01-02 DIAGNOSIS — M810 Age-related osteoporosis without current pathological fracture: Secondary | ICD-10-CM | POA: Diagnosis not present

## 2019-01-02 DIAGNOSIS — I5033 Acute on chronic diastolic (congestive) heart failure: Secondary | ICD-10-CM | POA: Diagnosis not present

## 2019-01-02 DIAGNOSIS — F0150 Vascular dementia without behavioral disturbance: Secondary | ICD-10-CM | POA: Diagnosis not present

## 2019-01-08 DIAGNOSIS — F0150 Vascular dementia without behavioral disturbance: Secondary | ICD-10-CM | POA: Diagnosis not present

## 2019-01-08 DIAGNOSIS — M810 Age-related osteoporosis without current pathological fracture: Secondary | ICD-10-CM | POA: Diagnosis not present

## 2019-01-08 DIAGNOSIS — I5033 Acute on chronic diastolic (congestive) heart failure: Secondary | ICD-10-CM | POA: Diagnosis not present

## 2019-01-08 DIAGNOSIS — L89623 Pressure ulcer of left heel, stage 3: Secondary | ICD-10-CM | POA: Diagnosis not present

## 2019-01-08 DIAGNOSIS — J441 Chronic obstructive pulmonary disease with (acute) exacerbation: Secondary | ICD-10-CM | POA: Diagnosis not present

## 2019-01-08 DIAGNOSIS — I5022 Chronic systolic (congestive) heart failure: Secondary | ICD-10-CM | POA: Diagnosis not present

## 2019-01-16 DIAGNOSIS — M810 Age-related osteoporosis without current pathological fracture: Secondary | ICD-10-CM | POA: Diagnosis not present

## 2019-01-16 DIAGNOSIS — L89623 Pressure ulcer of left heel, stage 3: Secondary | ICD-10-CM | POA: Diagnosis not present

## 2019-01-16 DIAGNOSIS — J441 Chronic obstructive pulmonary disease with (acute) exacerbation: Secondary | ICD-10-CM | POA: Diagnosis not present

## 2019-01-16 DIAGNOSIS — I5033 Acute on chronic diastolic (congestive) heart failure: Secondary | ICD-10-CM | POA: Diagnosis not present

## 2019-01-16 DIAGNOSIS — I5022 Chronic systolic (congestive) heart failure: Secondary | ICD-10-CM | POA: Diagnosis not present

## 2019-01-16 DIAGNOSIS — F0150 Vascular dementia without behavioral disturbance: Secondary | ICD-10-CM | POA: Diagnosis not present

## 2019-01-17 DIAGNOSIS — F419 Anxiety disorder, unspecified: Secondary | ICD-10-CM | POA: Diagnosis not present

## 2019-01-17 DIAGNOSIS — E785 Hyperlipidemia, unspecified: Secondary | ICD-10-CM | POA: Diagnosis not present

## 2019-01-17 DIAGNOSIS — K219 Gastro-esophageal reflux disease without esophagitis: Secondary | ICD-10-CM | POA: Diagnosis not present

## 2019-01-17 DIAGNOSIS — M25562 Pain in left knee: Secondary | ICD-10-CM | POA: Diagnosis not present

## 2019-01-17 DIAGNOSIS — M25561 Pain in right knee: Secondary | ICD-10-CM | POA: Diagnosis not present

## 2019-01-17 DIAGNOSIS — L89629 Pressure ulcer of left heel, unspecified stage: Secondary | ICD-10-CM | POA: Diagnosis not present

## 2019-01-17 DIAGNOSIS — M545 Low back pain: Secondary | ICD-10-CM | POA: Diagnosis not present

## 2019-01-17 DIAGNOSIS — G4700 Insomnia, unspecified: Secondary | ICD-10-CM | POA: Diagnosis not present

## 2019-01-17 DIAGNOSIS — J8410 Pulmonary fibrosis, unspecified: Secondary | ICD-10-CM | POA: Diagnosis not present

## 2019-01-17 DIAGNOSIS — J449 Chronic obstructive pulmonary disease, unspecified: Secondary | ICD-10-CM | POA: Diagnosis not present

## 2019-01-17 DIAGNOSIS — M810 Age-related osteoporosis without current pathological fracture: Secondary | ICD-10-CM | POA: Diagnosis not present

## 2019-01-17 DIAGNOSIS — I5032 Chronic diastolic (congestive) heart failure: Secondary | ICD-10-CM | POA: Diagnosis not present

## 2019-01-18 DIAGNOSIS — Z79899 Other long term (current) drug therapy: Secondary | ICD-10-CM | POA: Diagnosis not present

## 2019-01-22 DIAGNOSIS — I5022 Chronic systolic (congestive) heart failure: Secondary | ICD-10-CM | POA: Diagnosis not present

## 2019-01-22 DIAGNOSIS — M810 Age-related osteoporosis without current pathological fracture: Secondary | ICD-10-CM | POA: Diagnosis not present

## 2019-01-22 DIAGNOSIS — L89623 Pressure ulcer of left heel, stage 3: Secondary | ICD-10-CM | POA: Diagnosis not present

## 2019-01-22 DIAGNOSIS — J441 Chronic obstructive pulmonary disease with (acute) exacerbation: Secondary | ICD-10-CM | POA: Diagnosis not present

## 2019-01-22 DIAGNOSIS — I5033 Acute on chronic diastolic (congestive) heart failure: Secondary | ICD-10-CM | POA: Diagnosis not present

## 2019-01-22 DIAGNOSIS — F0150 Vascular dementia without behavioral disturbance: Secondary | ICD-10-CM | POA: Diagnosis not present

## 2019-01-23 DIAGNOSIS — F0150 Vascular dementia without behavioral disturbance: Secondary | ICD-10-CM | POA: Diagnosis not present

## 2019-01-23 DIAGNOSIS — Z7722 Contact with and (suspected) exposure to environmental tobacco smoke (acute) (chronic): Secondary | ICD-10-CM | POA: Diagnosis not present

## 2019-01-23 DIAGNOSIS — E43 Unspecified severe protein-calorie malnutrition: Secondary | ICD-10-CM | POA: Diagnosis not present

## 2019-01-23 DIAGNOSIS — Z79891 Long term (current) use of opiate analgesic: Secondary | ICD-10-CM | POA: Diagnosis not present

## 2019-01-23 DIAGNOSIS — M810 Age-related osteoporosis without current pathological fracture: Secondary | ICD-10-CM | POA: Diagnosis not present

## 2019-01-23 DIAGNOSIS — R339 Retention of urine, unspecified: Secondary | ICD-10-CM | POA: Diagnosis not present

## 2019-01-23 DIAGNOSIS — D649 Anemia, unspecified: Secondary | ICD-10-CM | POA: Diagnosis not present

## 2019-01-23 DIAGNOSIS — K219 Gastro-esophageal reflux disease without esophagitis: Secondary | ICD-10-CM | POA: Diagnosis not present

## 2019-01-23 DIAGNOSIS — I5033 Acute on chronic diastolic (congestive) heart failure: Secondary | ICD-10-CM | POA: Diagnosis not present

## 2019-01-23 DIAGNOSIS — Z8701 Personal history of pneumonia (recurrent): Secondary | ICD-10-CM | POA: Diagnosis not present

## 2019-01-23 DIAGNOSIS — F419 Anxiety disorder, unspecified: Secondary | ICD-10-CM | POA: Diagnosis not present

## 2019-01-23 DIAGNOSIS — J441 Chronic obstructive pulmonary disease with (acute) exacerbation: Secondary | ICD-10-CM | POA: Diagnosis not present

## 2019-01-23 DIAGNOSIS — E785 Hyperlipidemia, unspecified: Secondary | ICD-10-CM | POA: Diagnosis not present

## 2019-01-23 DIAGNOSIS — L89623 Pressure ulcer of left heel, stage 3: Secondary | ICD-10-CM | POA: Diagnosis not present

## 2019-01-23 DIAGNOSIS — F329 Major depressive disorder, single episode, unspecified: Secondary | ICD-10-CM | POA: Diagnosis not present

## 2019-01-23 DIAGNOSIS — Z87311 Personal history of (healed) other pathological fracture: Secondary | ICD-10-CM | POA: Diagnosis not present

## 2019-01-23 DIAGNOSIS — K589 Irritable bowel syndrome without diarrhea: Secondary | ICD-10-CM | POA: Diagnosis not present

## 2019-01-23 DIAGNOSIS — I5022 Chronic systolic (congestive) heart failure: Secondary | ICD-10-CM | POA: Diagnosis not present

## 2019-01-23 DIAGNOSIS — Z9181 History of falling: Secondary | ICD-10-CM | POA: Diagnosis not present

## 2019-01-24 ENCOUNTER — Encounter: Payer: Self-pay | Admitting: Cardiology

## 2019-01-24 NOTE — Progress Notes (Signed)
Virtual Visit via Telephone Note   This visit type was conducted due to national recommendations for restrictions regarding the COVID-19 Pandemic (e.g. social distancing) in an effort to limit this patient's exposure and mitigate transmission in our community.  Due to her co-morbid illnesses, this patient is at least at moderate risk for complications without adequate follow up.  This format is felt to be most appropriate for this patient at this time.  The patient did not have access to video technology/had technical difficulties with video requiring transitioning to audio format only (telephone).  All issues noted in this document were discussed and addressed.  No physical exam could be performed with this format.  Please refer to the patient's chart for her  consent to telehealth for [facility].   Date:  01/25/2019   ID:  [name], DOB [birth year], MRN [id number]  Patient Location: Home Provider Location: Office  PCP:  [name], MD  Cardiologist:  Rozann Lesches, MD Electrophysiologist:  None   Evaluation Performed:  Follow-Up Visit  Chief Complaint:  Cardiac follow-up  History of Present Illness:    [name] is an 85 y.o. female last seen in August 2019.  I spoke with her daughter by phone today.  She tells me that her mother has very limited hearing and that she would communicate with her for me.  Records indicate hospitalization in September 2020 with acute on chronic diastolic heart failure associated with pneumonia.  She had an updated echocardiogram done at that time, results outlined below.  She has been home since that time, her weight is down through combination of diuretics and also limiting salt in the diet.  Her daughter sounds like she has been very attentive and helping her mother and watching her medications.  Appetite reported to be stable.  Her daughter does indicate concerns that her mother has developed dementia.  She has transitioned to the  [facility] clinic following Dr. Luan Pulling' retirement.  Current cardiac regimen includes Lasix at 40 mg daily along with potassium supplements.   Past Medical History:  Diagnosis Date  . Anxiety   . Chronic diastolic (congestive) heart failure ([place])   . Chronic obstructive pulmonary disease, unspecified ([place])   . COPD (chronic obstructive pulmonary disease) ([place])   . GERD (gastroesophageal reflux disease)   . Hyperlipidemia   . IBS (irritable bowel syndrome)   . Lumbago with sciatica   . Osteoporosis   . Overactive bladder   . Pneumonia    Past Surgical History:  Procedure Laterality Date  . COLONOSCOPY  10/2009   sigmoid diverticula, multiple tubulovillous adneomas, needs surveillance Oct 2013  . ESOPHAGOGASTRODUODENOSCOPY  04/16/2011   Dr. Gala Romney: Noncritical Schatzkis ring( not manipulated because no dysphagia). Normal esophagus otherwise  large hiatal hernia. Gastric polyp-status post biopsy. Gastric erosions-staus post biopsy. Abnormal bulb-status post biopsy. benign small bowel, stomach biopsy with ulcerated gastric antral mucosa with foveolar hyperplasia and surface erosion, polyp with inflamed gastric antral mucosa   . ESOPHAGOGASTRODUODENOSCOPY N/A 04/29/2014   Dr. Gala Romney: Noncritical Schatzki's ring large hiatal hernia. Retained gastric contents.GES with slight delay  . HIP PINNING,CANNULATED Right 04/06/2018   Procedure: CANNULATED HIP PINNING;  Surgeon: Carole Civil, MD;  Location: [facility];  Service: Orthopedics;  Laterality: Right;     Current Meds  Medication Sig  . acetaminophen (TYLENOL) 500 MG tablet Take 500 mg by mouth daily.  Marland Kitchen albuterol (PROAIR HFA) 108 (90 Base) MCG/ACT inhaler Inhale 2 puffs into the lungs every 4 (four)  hours as needed.  Marland Kitchen albuterol (PROVENTIL) (2.5 MG/3ML) 0.083% nebulizer solution Take 3 mLs (2.5 mg total) by nebulization every 6 (six) hours as needed for wheezing or shortness of breath.  . ALPRAZolam (XANAX) 0.5 MG tablet Take 0.5 mg by mouth  2 (two) times daily.  . cetirizine (ZYRTEC) 10 MG tablet Take 10 mg by mouth daily as needed for allergies.  Marland Kitchen diclofenac sodium (VOLTAREN) 1 % GEL Apply 2 g topically 2 (two) times daily.  . furosemide (LASIX) 40 MG tablet Take 40 mg by mouth daily.  Marland Kitchen linaclotide (LINZESS) 145 MCG CAPS capsule Take 1 capsule (145 mcg total) by mouth daily as needed. (Patient taking differently: Take 145 mcg by mouth daily as needed (for constipation). )  . Melatonin 1 MG TABS Take 5 mg by mouth daily as needed (sleep).  . Multiple Vitamin (MULTIVITAMIN WITH MINERALS) TABS tablet Take 1 tablet by mouth daily.  . pantoprazole (PROTONIX) 40 MG tablet Take 40 mg by mouth 2 (two) times daily.   . potassium chloride (K-DUR) 10 MEQ tablet Take 1 tablet (10 mEq total) by mouth daily.  . tamsulosin (FLOMAX) 0.4 MG CAPS capsule Take 1 capsule (0.4 mg total) by mouth daily.  . traMADol (ULTRAM) 50 MG tablet Take 1 tablet (50 mg total) by mouth every 6 (six) hours as needed (pain). (Patient taking differently: Take 50 mg by mouth 2 (two) times daily. )  . umeclidinium-vilanterol (ANORO ELLIPTA) 62.5-25 MCG/INH AEPB Inhale 1 puff into the lungs daily.  Marland Kitchen zolpidem (AMBIEN) 5 MG tablet Take 1 tablet (5 mg total) by mouth at bedtime as needed for up to 13 days for sleep. X 14 days (Patient taking differently: Take 5 mg by mouth at bedtime. )  . [DISCONTINUED] meloxicam (MOBIC) 7.5 MG tablet Take 1 tablet (7.5 mg total) by mouth daily.     Allergies:   Codeine, Codeine, Levofloxacin, Sulfonamide derivatives, and Sulfa antibiotics   Social History   Tobacco Use  . Smoking status: Passive Smoke Exposure - Never Smoker  . Smokeless tobacco: Current User    Types: Snuff  Substance Use Topics  . Alcohol use: No  . Drug use: No     Family Hx: The patient's family history includes Diabetes in her mother; Heart disease in her father; Stroke in her mother. There is no history of Colon cancer.  ROS:   Please see the history  of present illness.    Did not directly communicate with the patient.  Prior CV studies:   The following studies were reviewed today:  Echocardiogram 09/30/2018:  1. Left ventricular ejection fraction, by visual estimation, is 60 to 65%. The left ventricle has normal function. Normal left ventricular size. Normal left ventricular posterior wall thickness. There is no left ventricular hypertrophy.  2. Left ventricular diastolic Doppler parameters are indeterminate pattern of LV diastolic filling.  3. There is mild basal septal LV hypertrophy.  4. Global right ventricle has normal systolic function.The right ventricular size is normal. No increase in right ventricular wall thickness.  5. Left atrial size was moderately dilated.  6. Right atrial size was mildly dilated.  7. The mitral valve is normal in structure. Mild mitral valve regurgitation. No evidence of mitral stenosis.  8. The tricuspid valve is normal in structure. Tricuspid valve regurgitation moderate.  9. The aortic valve is tricuspid Aortic valve regurgitation was not visualized by color flow Doppler. Mild aortic valve sclerosis without stenosis. 10. The pulmonic valve was not well visualized. Pulmonic  valve regurgitation is not visualized by color flow Doppler. 11. Moderately elevated pulmonary artery systolic pressure. 12. Pulmonary HTN is moderate, PASP is 43 mmHg. 13. The inferior vena cava is normal in size with greater than 50% respiratory variability, suggesting right atrial pressure of 3 mmHg.  Chest CTA 06/29/2018: FINDINGS: Cardiovascular: Satisfactory opacification of the pulmonary arteries to the segmental level. No evidence of pulmonary embolism. Nonaneurysmal aorta. No dissection is seen. Mild to moderate aortic atherosclerosis. Tortuous aorta. Coronary vascular calcifications. Heart size upper normal. No pericardial effusion.  Mediastinum/Nodes: Midline trachea. No suspicious thyroid mass. No significant  adenopathy. Thickened appearance of the distal esophagus. Large hiatal hernia containing most of the stomach, mesenteric fat, and some of the colon. This is increased compared to prior CT.  Lungs/Pleura: Small bilateral pleural effusions which are new. Partial atelectasis within the lower lobes. Mild subpleural fibrosis within the right middle lobe lingula and lower lobes. Peribronchial ground-glass density in the left upper lobe and superior segment of left lower lobe with additional scattered foci of ground-glass density in the right upper lobe and superior segment right lower lobe. Increased septal thickening, suggesting mild underlying edema.  Upper Abdomen: No acute abnormality.  Musculoskeletal: Kyphosis of the spine. Multiple chronic compression fractures T6 through T9 and at T11 with increased loss of height of T11 vertebral body since 02/17/2018 CT.  Review of the MIP images confirms the above findings.  IMPRESSION: 1. Negative for acute pulmonary embolus or aortic dissection. 2. Small bilateral pleural effusions with partial atelectasis at the lower lobes. Multifocal ground-glass densities within the left perihilar region, upper lobes and superior segments of the lower lobes, possible multifocal pneumonia. Diffuse bilateral septal thickening, suggesting underlying interstitial edema. 3. Large hiatal hernia containing stomach, mesentery and colon. This appears slightly increased compared to prior CT   Labs/Other Tests and Data Reviewed:    EKG:  An ECG dated 09/29/2018 was personally reviewed today and demonstrated:  Normal sinus rhythm.  Recent Labs: 09/30/2018: TSH 1.346 10/18/2018: ALT 14; B Natriuretic Peptide 48.0; BUN 19; Creatinine, Ser 0.98; Hemoglobin 9.1; Platelets 372; Potassium 4.0; Sodium 138   Recent Lipid Panel Lab Results  Component Value Date/Time   CHOL 261 (A) 05/16/2017 12:00 AM   TRIG 101 05/16/2017 12:00 AM   HDL 90 (A) 05/16/2017 12:00  AM   LDLCALC 150 05/16/2017 12:00 AM    Wt Readings from Last 3 Encounters:  01/25/19 107 lb (48.5 kg)  12/11/18 116 lb (52.6 kg)  10/18/18 122 lb 5.7 oz (55.5 kg)     Objective:    Vital Signs:  BP 106/64   Pulse 82   Ht 5\' 7"  (1.702 m)   Wt 107 lb (48.5 kg)   SpO2 96%   BMI 16.76 kg/m    Spoke exclusively with the patient's daughter.  ASSESSMENT & PLAN:    1.  Chronic diastolic heart failure.  LVEF 60 to 65% by most recent echocardiogram.  I reviewed records regarding her hospital stay late last year.  Her weight has come down consistently on diuretics, also by limiting salt in the diet.  Plan to continue Lasix at 40 mg daily with potassium supplement unless there are concerns about volume contraction or worsening renal function.  She has now established at the [place] clinic.  Lab work from October 2020 showed normal creatinine and potassium.  2.  COPD with chronic hypoxic respiratory failure.  She continues with regular nebulizer treatments.  COVID-19 Education: The signs and symptoms of COVID-19 were  discussed with the patient and how to seek care for testing (follow up with PCP or arrange E-visit).  The importance of social distancing was discussed today.  Time:   Today, I have spent 9 minutes with the patient with telehealth technology discussing the above problems.     Medication Adjustments/Labs and Tests Ordered: Current medicines are reviewed at length with the patient today.  Concerns regarding medicines are outlined above.   Tests Ordered: No orders of the defined types were placed in this encounter.   Medication Changes: No orders of the defined types were placed in this encounter.   Follow Up:  Either In Person or Virtual 6 months.  Signed, Rozann Lesches, MD  01/25/2019 9:15 AM    [place]

## 2019-01-25 ENCOUNTER — Telehealth (INDEPENDENT_AMBULATORY_CARE_PROVIDER_SITE_OTHER): Payer: Medicare Other | Admitting: Cardiology

## 2019-01-25 ENCOUNTER — Encounter: Payer: Self-pay | Admitting: Cardiology

## 2019-01-25 VITALS — BP 106/64 | HR 82 | Ht 67.0 in | Wt 107.0 lb

## 2019-01-25 DIAGNOSIS — G4700 Insomnia, unspecified: Secondary | ICD-10-CM | POA: Diagnosis not present

## 2019-01-25 DIAGNOSIS — M25561 Pain in right knee: Secondary | ICD-10-CM | POA: Diagnosis not present

## 2019-01-25 DIAGNOSIS — M25562 Pain in left knee: Secondary | ICD-10-CM | POA: Diagnosis not present

## 2019-01-25 DIAGNOSIS — F419 Anxiety disorder, unspecified: Secondary | ICD-10-CM | POA: Diagnosis not present

## 2019-01-25 DIAGNOSIS — R413 Other amnesia: Secondary | ICD-10-CM | POA: Diagnosis not present

## 2019-01-25 DIAGNOSIS — M545 Low back pain: Secondary | ICD-10-CM | POA: Diagnosis not present

## 2019-01-25 DIAGNOSIS — I5032 Chronic diastolic (congestive) heart failure: Secondary | ICD-10-CM | POA: Diagnosis not present

## 2019-01-25 DIAGNOSIS — J449 Chronic obstructive pulmonary disease, unspecified: Secondary | ICD-10-CM | POA: Diagnosis not present

## 2019-01-25 DIAGNOSIS — M810 Age-related osteoporosis without current pathological fracture: Secondary | ICD-10-CM | POA: Diagnosis not present

## 2019-01-25 NOTE — Patient Instructions (Signed)
Medication Instructions:  Your physician recommends that you continue on your current medications as directed. Please refer to the Current Medication list given to you today.  *If you need a refill on your cardiac medications before your next appointment, please call your pharmacy*  Lab Work: NONE If you have labs (blood work) drawn today and your tests are completely normal, you will receive your results only by: Marland Kitchen MyChart Message (if you have MyChart) OR . A paper copy in the mail If you have any lab test that is abnormal or we need to change your treatment, we will call you to review the results.  Testing/Procedures: NONE  Follow-Up: At [facility], you and your health needs are our priority.  As part of our continuing mission to provide you with exceptional heart care, we have created designated Provider Care Teams.  These Care Teams include your primary Cardiologist (physician) and Advanced Practice Providers (APPs -  Physician Assistants and Nurse Practitioners) who all work together to provide you with the care you need, when you need it.  Your next appointment:   6 month(s)  The format for your next appointment:   Virtual Visit   Provider:   Rozann Lesches, MD  Other Instructions NONE       Thank you for choosing [facility] !

## 2019-01-26 ENCOUNTER — Telehealth: Payer: Self-pay | Admitting: Orthopedic Surgery

## 2019-01-26 ENCOUNTER — Other Ambulatory Visit: Payer: Self-pay | Admitting: Orthopedic Surgery

## 2019-01-26 DIAGNOSIS — M545 Low back pain, unspecified: Secondary | ICD-10-CM

## 2019-01-26 MED ORDER — MELOXICAM 7.5 MG PO TABS: 7.5000 mg | ORAL_TABLET | Freq: Two times a day (BID) | ORAL | 5 refills | Status: DC

## 2019-01-26 NOTE — Telephone Encounter (Signed)
Q1 no   Take meloxicam twice a day and tylenol 500 mg evry 6 hrs

## 2019-01-26 NOTE — Telephone Encounter (Signed)
Patient's daughter/designated contact Rebecka Apley, [phone], called with question about patient having finished the medication, and not feeling much better from the injection and medication. Asking if another medication may be prescribed - states cannot tolerate any 'strong,high-powered medication. [facility] is her pharmacy.  States patient is a fall risk.  If needs another appointment, can it be a virtual visit?

## 2019-01-26 NOTE — Telephone Encounter (Signed)
meloxicam (MOBIC) 7.5 MG tablet     Sig: Take 1 tablet (7.5 mg total) by mouth daily.    Dispense:  30 tablet    Refill:  5

## 2019-01-29 DIAGNOSIS — M810 Age-related osteoporosis without current pathological fracture: Secondary | ICD-10-CM | POA: Diagnosis not present

## 2019-01-29 DIAGNOSIS — I5033 Acute on chronic diastolic (congestive) heart failure: Secondary | ICD-10-CM | POA: Diagnosis not present

## 2019-01-29 DIAGNOSIS — F0150 Vascular dementia without behavioral disturbance: Secondary | ICD-10-CM | POA: Diagnosis not present

## 2019-01-29 DIAGNOSIS — J441 Chronic obstructive pulmonary disease with (acute) exacerbation: Secondary | ICD-10-CM | POA: Diagnosis not present

## 2019-01-29 DIAGNOSIS — I5022 Chronic systolic (congestive) heart failure: Secondary | ICD-10-CM | POA: Diagnosis not present

## 2019-01-29 DIAGNOSIS — L89623 Pressure ulcer of left heel, stage 3: Secondary | ICD-10-CM | POA: Diagnosis not present

## 2019-01-29 NOTE — Telephone Encounter (Signed)
I called to advise to increase the Meloxicam to bid and to use tylenol also.   Left message for [name] to call back

## 2019-02-01 NOTE — Telephone Encounter (Signed)
Tried again, unable to reach

## 2019-02-05 DIAGNOSIS — L89623 Pressure ulcer of left heel, stage 3: Secondary | ICD-10-CM | POA: Diagnosis not present

## 2019-02-05 DIAGNOSIS — M810 Age-related osteoporosis without current pathological fracture: Secondary | ICD-10-CM | POA: Diagnosis not present

## 2019-02-05 DIAGNOSIS — I5033 Acute on chronic diastolic (congestive) heart failure: Secondary | ICD-10-CM | POA: Diagnosis not present

## 2019-02-05 DIAGNOSIS — I5022 Chronic systolic (congestive) heart failure: Secondary | ICD-10-CM | POA: Diagnosis not present

## 2019-02-05 DIAGNOSIS — F0150 Vascular dementia without behavioral disturbance: Secondary | ICD-10-CM | POA: Diagnosis not present

## 2019-02-05 DIAGNOSIS — J441 Chronic obstructive pulmonary disease with (acute) exacerbation: Secondary | ICD-10-CM | POA: Diagnosis not present

## 2019-02-12 DIAGNOSIS — L89623 Pressure ulcer of left heel, stage 3: Secondary | ICD-10-CM | POA: Diagnosis not present

## 2019-02-12 DIAGNOSIS — J441 Chronic obstructive pulmonary disease with (acute) exacerbation: Secondary | ICD-10-CM | POA: Diagnosis not present

## 2019-02-12 DIAGNOSIS — M810 Age-related osteoporosis without current pathological fracture: Secondary | ICD-10-CM | POA: Diagnosis not present

## 2019-02-12 DIAGNOSIS — F0150 Vascular dementia without behavioral disturbance: Secondary | ICD-10-CM | POA: Diagnosis not present

## 2019-02-12 DIAGNOSIS — I5033 Acute on chronic diastolic (congestive) heart failure: Secondary | ICD-10-CM | POA: Diagnosis not present

## 2019-02-12 DIAGNOSIS — I5022 Chronic systolic (congestive) heart failure: Secondary | ICD-10-CM | POA: Diagnosis not present

## 2019-02-19 DIAGNOSIS — J441 Chronic obstructive pulmonary disease with (acute) exacerbation: Secondary | ICD-10-CM | POA: Diagnosis not present

## 2019-02-19 DIAGNOSIS — I5033 Acute on chronic diastolic (congestive) heart failure: Secondary | ICD-10-CM | POA: Diagnosis not present

## 2019-02-19 DIAGNOSIS — F0150 Vascular dementia without behavioral disturbance: Secondary | ICD-10-CM | POA: Diagnosis not present

## 2019-02-19 DIAGNOSIS — M810 Age-related osteoporosis without current pathological fracture: Secondary | ICD-10-CM | POA: Diagnosis not present

## 2019-02-19 DIAGNOSIS — I5022 Chronic systolic (congestive) heart failure: Secondary | ICD-10-CM | POA: Diagnosis not present

## 2019-02-19 DIAGNOSIS — L89623 Pressure ulcer of left heel, stage 3: Secondary | ICD-10-CM | POA: Diagnosis not present

## 2019-02-22 DIAGNOSIS — F0150 Vascular dementia without behavioral disturbance: Secondary | ICD-10-CM | POA: Diagnosis not present

## 2019-02-22 DIAGNOSIS — Z9181 History of falling: Secondary | ICD-10-CM | POA: Diagnosis not present

## 2019-02-22 DIAGNOSIS — M810 Age-related osteoporosis without current pathological fracture: Secondary | ICD-10-CM | POA: Diagnosis not present

## 2019-02-22 DIAGNOSIS — E43 Unspecified severe protein-calorie malnutrition: Secondary | ICD-10-CM | POA: Diagnosis not present

## 2019-02-22 DIAGNOSIS — I5033 Acute on chronic diastolic (congestive) heart failure: Secondary | ICD-10-CM | POA: Diagnosis not present

## 2019-02-22 DIAGNOSIS — E785 Hyperlipidemia, unspecified: Secondary | ICD-10-CM | POA: Diagnosis not present

## 2019-02-22 DIAGNOSIS — F329 Major depressive disorder, single episode, unspecified: Secondary | ICD-10-CM | POA: Diagnosis not present

## 2019-02-22 DIAGNOSIS — I5022 Chronic systolic (congestive) heart failure: Secondary | ICD-10-CM | POA: Diagnosis not present

## 2019-02-22 DIAGNOSIS — L89623 Pressure ulcer of left heel, stage 3: Secondary | ICD-10-CM | POA: Diagnosis not present

## 2019-02-22 DIAGNOSIS — Z87311 Personal history of (healed) other pathological fracture: Secondary | ICD-10-CM | POA: Diagnosis not present

## 2019-02-22 DIAGNOSIS — Z79891 Long term (current) use of opiate analgesic: Secondary | ICD-10-CM | POA: Diagnosis not present

## 2019-02-22 DIAGNOSIS — R339 Retention of urine, unspecified: Secondary | ICD-10-CM | POA: Diagnosis not present

## 2019-02-22 DIAGNOSIS — Z7722 Contact with and (suspected) exposure to environmental tobacco smoke (acute) (chronic): Secondary | ICD-10-CM | POA: Diagnosis not present

## 2019-02-22 DIAGNOSIS — J441 Chronic obstructive pulmonary disease with (acute) exacerbation: Secondary | ICD-10-CM | POA: Diagnosis not present

## 2019-02-22 DIAGNOSIS — K219 Gastro-esophageal reflux disease without esophagitis: Secondary | ICD-10-CM | POA: Diagnosis not present

## 2019-02-22 DIAGNOSIS — D649 Anemia, unspecified: Secondary | ICD-10-CM | POA: Diagnosis not present

## 2019-02-22 DIAGNOSIS — K589 Irritable bowel syndrome without diarrhea: Secondary | ICD-10-CM | POA: Diagnosis not present

## 2019-02-22 DIAGNOSIS — Z8701 Personal history of pneumonia (recurrent): Secondary | ICD-10-CM | POA: Diagnosis not present

## 2019-02-22 DIAGNOSIS — F419 Anxiety disorder, unspecified: Secondary | ICD-10-CM | POA: Diagnosis not present

## 2019-02-26 DIAGNOSIS — J441 Chronic obstructive pulmonary disease with (acute) exacerbation: Secondary | ICD-10-CM | POA: Diagnosis not present

## 2019-02-26 DIAGNOSIS — I5022 Chronic systolic (congestive) heart failure: Secondary | ICD-10-CM | POA: Diagnosis not present

## 2019-02-26 DIAGNOSIS — L89623 Pressure ulcer of left heel, stage 3: Secondary | ICD-10-CM | POA: Diagnosis not present

## 2019-02-26 DIAGNOSIS — Z5181 Encounter for therapeutic drug level monitoring: Secondary | ICD-10-CM | POA: Diagnosis not present

## 2019-02-26 DIAGNOSIS — I5033 Acute on chronic diastolic (congestive) heart failure: Secondary | ICD-10-CM | POA: Diagnosis not present

## 2019-02-26 DIAGNOSIS — M810 Age-related osteoporosis without current pathological fracture: Secondary | ICD-10-CM | POA: Diagnosis not present

## 2019-02-26 DIAGNOSIS — F0150 Vascular dementia without behavioral disturbance: Secondary | ICD-10-CM | POA: Diagnosis not present

## 2019-02-26 DIAGNOSIS — F419 Anxiety disorder, unspecified: Secondary | ICD-10-CM | POA: Diagnosis not present

## 2019-02-27 DIAGNOSIS — R251 Tremor, unspecified: Secondary | ICD-10-CM | POA: Diagnosis not present

## 2019-02-27 DIAGNOSIS — G301 Alzheimer's disease with late onset: Secondary | ICD-10-CM | POA: Diagnosis not present

## 2019-02-27 DIAGNOSIS — F482 Pseudobulbar affect: Secondary | ICD-10-CM | POA: Diagnosis not present

## 2019-02-27 DIAGNOSIS — R2681 Unsteadiness on feet: Secondary | ICD-10-CM | POA: Diagnosis not present

## 2019-02-28 DIAGNOSIS — M810 Age-related osteoporosis without current pathological fracture: Secondary | ICD-10-CM | POA: Diagnosis not present

## 2019-02-28 DIAGNOSIS — J441 Chronic obstructive pulmonary disease with (acute) exacerbation: Secondary | ICD-10-CM | POA: Diagnosis not present

## 2019-02-28 DIAGNOSIS — F0150 Vascular dementia without behavioral disturbance: Secondary | ICD-10-CM | POA: Diagnosis not present

## 2019-02-28 DIAGNOSIS — I5020 Unspecified systolic (congestive) heart failure: Secondary | ICD-10-CM | POA: Diagnosis not present

## 2019-02-28 DIAGNOSIS — I5022 Chronic systolic (congestive) heart failure: Secondary | ICD-10-CM | POA: Diagnosis not present

## 2019-02-28 DIAGNOSIS — I5033 Acute on chronic diastolic (congestive) heart failure: Secondary | ICD-10-CM | POA: Diagnosis not present

## 2019-02-28 DIAGNOSIS — L89623 Pressure ulcer of left heel, stage 3: Secondary | ICD-10-CM | POA: Diagnosis not present

## 2019-03-06 DIAGNOSIS — D649 Anemia, unspecified: Secondary | ICD-10-CM | POA: Diagnosis not present

## 2019-03-06 DIAGNOSIS — G4700 Insomnia, unspecified: Secondary | ICD-10-CM | POA: Diagnosis not present

## 2019-03-06 DIAGNOSIS — Z6823 Body mass index (BMI) 23.0-23.9, adult: Secondary | ICD-10-CM | POA: Diagnosis not present

## 2019-03-06 DIAGNOSIS — E785 Hyperlipidemia, unspecified: Secondary | ICD-10-CM | POA: Diagnosis not present

## 2019-03-06 DIAGNOSIS — I5032 Chronic diastolic (congestive) heart failure: Secondary | ICD-10-CM | POA: Diagnosis not present

## 2019-03-06 DIAGNOSIS — M545 Low back pain: Secondary | ICD-10-CM | POA: Diagnosis not present

## 2019-03-06 DIAGNOSIS — K219 Gastro-esophageal reflux disease without esophagitis: Secondary | ICD-10-CM | POA: Diagnosis not present

## 2019-03-06 DIAGNOSIS — F0150 Vascular dementia without behavioral disturbance: Secondary | ICD-10-CM | POA: Diagnosis not present

## 2019-03-06 DIAGNOSIS — I5022 Chronic systolic (congestive) heart failure: Secondary | ICD-10-CM | POA: Diagnosis not present

## 2019-03-06 DIAGNOSIS — J441 Chronic obstructive pulmonary disease with (acute) exacerbation: Secondary | ICD-10-CM | POA: Diagnosis not present

## 2019-03-06 DIAGNOSIS — I5033 Acute on chronic diastolic (congestive) heart failure: Secondary | ICD-10-CM | POA: Diagnosis not present

## 2019-03-06 DIAGNOSIS — Z79899 Other long term (current) drug therapy: Secondary | ICD-10-CM | POA: Diagnosis not present

## 2019-03-06 DIAGNOSIS — Z5181 Encounter for therapeutic drug level monitoring: Secondary | ICD-10-CM | POA: Diagnosis not present

## 2019-03-06 DIAGNOSIS — F419 Anxiety disorder, unspecified: Secondary | ICD-10-CM | POA: Diagnosis not present

## 2019-03-06 DIAGNOSIS — J449 Chronic obstructive pulmonary disease, unspecified: Secondary | ICD-10-CM | POA: Diagnosis not present

## 2019-03-06 DIAGNOSIS — Z0001 Encounter for general adult medical examination with abnormal findings: Secondary | ICD-10-CM | POA: Diagnosis not present

## 2019-03-06 DIAGNOSIS — L89623 Pressure ulcer of left heel, stage 3: Secondary | ICD-10-CM | POA: Diagnosis not present

## 2019-03-06 DIAGNOSIS — M25562 Pain in left knee: Secondary | ICD-10-CM | POA: Diagnosis not present

## 2019-03-06 DIAGNOSIS — M810 Age-related osteoporosis without current pathological fracture: Secondary | ICD-10-CM | POA: Diagnosis not present

## 2019-03-12 DIAGNOSIS — M810 Age-related osteoporosis without current pathological fracture: Secondary | ICD-10-CM | POA: Diagnosis not present

## 2019-03-12 DIAGNOSIS — L89623 Pressure ulcer of left heel, stage 3: Secondary | ICD-10-CM | POA: Diagnosis not present

## 2019-03-12 DIAGNOSIS — I5022 Chronic systolic (congestive) heart failure: Secondary | ICD-10-CM | POA: Diagnosis not present

## 2019-03-12 DIAGNOSIS — F0150 Vascular dementia without behavioral disturbance: Secondary | ICD-10-CM | POA: Diagnosis not present

## 2019-03-12 DIAGNOSIS — I5033 Acute on chronic diastolic (congestive) heart failure: Secondary | ICD-10-CM | POA: Diagnosis not present

## 2019-03-12 DIAGNOSIS — J441 Chronic obstructive pulmonary disease with (acute) exacerbation: Secondary | ICD-10-CM | POA: Diagnosis not present

## 2019-03-20 DIAGNOSIS — L89623 Pressure ulcer of left heel, stage 3: Secondary | ICD-10-CM | POA: Diagnosis not present

## 2019-03-20 DIAGNOSIS — J441 Chronic obstructive pulmonary disease with (acute) exacerbation: Secondary | ICD-10-CM | POA: Diagnosis not present

## 2019-03-20 DIAGNOSIS — M810 Age-related osteoporosis without current pathological fracture: Secondary | ICD-10-CM | POA: Diagnosis not present

## 2019-03-20 DIAGNOSIS — F0150 Vascular dementia without behavioral disturbance: Secondary | ICD-10-CM | POA: Diagnosis not present

## 2019-03-20 DIAGNOSIS — I5022 Chronic systolic (congestive) heart failure: Secondary | ICD-10-CM | POA: Diagnosis not present

## 2019-03-20 DIAGNOSIS — I5033 Acute on chronic diastolic (congestive) heart failure: Secondary | ICD-10-CM | POA: Diagnosis not present

## 2019-03-28 ENCOUNTER — Other Ambulatory Visit: Payer: Self-pay | Admitting: Gastroenterology

## 2019-03-28 NOTE — Telephone Encounter (Signed)
Please tell the patient she'll need a follow-up OV for more refill s(hasn't been seen in about 5 years)

## 2019-04-23 DIAGNOSIS — R251 Tremor, unspecified: Secondary | ICD-10-CM | POA: Insufficient documentation

## 2019-04-23 DIAGNOSIS — M130 Polyarthritis, unspecified: Secondary | ICD-10-CM | POA: Insufficient documentation

## 2019-04-23 DIAGNOSIS — R2681 Unsteadiness on feet: Secondary | ICD-10-CM | POA: Insufficient documentation

## 2019-04-23 DIAGNOSIS — G8929 Other chronic pain: Secondary | ICD-10-CM | POA: Insufficient documentation

## 2019-04-23 DIAGNOSIS — F482 Pseudobulbar affect: Secondary | ICD-10-CM | POA: Insufficient documentation

## 2019-04-26 ENCOUNTER — Other Ambulatory Visit: Payer: Self-pay | Admitting: Gastroenterology

## 2019-04-27 NOTE — Telephone Encounter (Signed)
Please tell the patient she hasn't been seen in 5 years. She'll need an OV for further refills or she can contact her PCP and see if they want to take over management of Protonix

## 2019-05-24 ENCOUNTER — Other Ambulatory Visit: Payer: Self-pay | Admitting: Gastroenterology

## 2019-05-29 ENCOUNTER — Other Ambulatory Visit: Payer: Self-pay | Admitting: Gastroenterology

## 2019-05-31 DIAGNOSIS — D649 Anemia, unspecified: Secondary | ICD-10-CM | POA: Diagnosis not present

## 2019-05-31 DIAGNOSIS — Z79899 Other long term (current) drug therapy: Secondary | ICD-10-CM | POA: Diagnosis not present

## 2019-05-31 DIAGNOSIS — D474 Osteomyelofibrosis: Secondary | ICD-10-CM | POA: Diagnosis not present

## 2019-05-31 DIAGNOSIS — E785 Hyperlipidemia, unspecified: Secondary | ICD-10-CM | POA: Diagnosis not present

## 2019-06-06 DIAGNOSIS — G309 Alzheimer's disease, unspecified: Secondary | ICD-10-CM | POA: Diagnosis not present

## 2019-06-06 DIAGNOSIS — K219 Gastro-esophageal reflux disease without esophagitis: Secondary | ICD-10-CM | POA: Diagnosis not present

## 2019-06-06 DIAGNOSIS — R251 Tremor, unspecified: Secondary | ICD-10-CM | POA: Diagnosis not present

## 2019-06-06 DIAGNOSIS — F419 Anxiety disorder, unspecified: Secondary | ICD-10-CM | POA: Diagnosis not present

## 2019-06-06 DIAGNOSIS — F482 Pseudobulbar affect: Secondary | ICD-10-CM | POA: Diagnosis not present

## 2019-06-06 DIAGNOSIS — M25562 Pain in left knee: Secondary | ICD-10-CM | POA: Diagnosis not present

## 2019-06-06 DIAGNOSIS — F0151 Vascular dementia with behavioral disturbance: Secondary | ICD-10-CM | POA: Diagnosis not present

## 2019-06-06 DIAGNOSIS — M545 Low back pain: Secondary | ICD-10-CM | POA: Diagnosis not present

## 2019-06-06 DIAGNOSIS — M25561 Pain in right knee: Secondary | ICD-10-CM | POA: Diagnosis not present

## 2019-06-06 DIAGNOSIS — M130 Polyarthritis, unspecified: Secondary | ICD-10-CM | POA: Diagnosis not present

## 2019-06-06 DIAGNOSIS — R278 Other lack of coordination: Secondary | ICD-10-CM | POA: Diagnosis not present

## 2019-06-06 DIAGNOSIS — Z79891 Long term (current) use of opiate analgesic: Secondary | ICD-10-CM | POA: Diagnosis not present

## 2019-06-06 DIAGNOSIS — Z79899 Other long term (current) drug therapy: Secondary | ICD-10-CM | POA: Diagnosis not present

## 2019-06-06 DIAGNOSIS — I5032 Chronic diastolic (congestive) heart failure: Secondary | ICD-10-CM | POA: Diagnosis not present

## 2019-06-06 DIAGNOSIS — E785 Hyperlipidemia, unspecified: Secondary | ICD-10-CM | POA: Diagnosis not present

## 2019-06-06 DIAGNOSIS — R7303 Prediabetes: Secondary | ICD-10-CM | POA: Diagnosis not present

## 2019-06-06 DIAGNOSIS — G4700 Insomnia, unspecified: Secondary | ICD-10-CM | POA: Diagnosis not present

## 2019-06-06 DIAGNOSIS — D649 Anemia, unspecified: Secondary | ICD-10-CM | POA: Diagnosis not present

## 2019-06-20 ENCOUNTER — Other Ambulatory Visit: Payer: Self-pay | Admitting: Orthopedic Surgery

## 2019-06-20 DIAGNOSIS — M545 Low back pain, unspecified: Secondary | ICD-10-CM

## 2019-07-06 ENCOUNTER — Other Ambulatory Visit: Payer: Self-pay

## 2019-07-06 ENCOUNTER — Encounter (HOSPITAL_COMMUNITY): Payer: Self-pay | Admitting: Emergency Medicine

## 2019-07-06 ENCOUNTER — Emergency Department (HOSPITAL_COMMUNITY)
Admission: EM | Admit: 2019-07-06 | Discharge: 2019-07-06 | Disposition: A | Discharge status: Home / Self Care | Payer: Medicare Other | Attending: Emergency Medicine | Admitting: Emergency Medicine

## 2019-07-06 ENCOUNTER — Emergency Department (HOSPITAL_COMMUNITY): Payer: Medicare Other

## 2019-07-06 DIAGNOSIS — Z7722 Contact with and (suspected) exposure to environmental tobacco smoke (acute) (chronic): Secondary | ICD-10-CM | POA: Insufficient documentation

## 2019-07-06 DIAGNOSIS — J449 Chronic obstructive pulmonary disease, unspecified: Secondary | ICD-10-CM | POA: Diagnosis not present

## 2019-07-06 DIAGNOSIS — R1030 Lower abdominal pain, unspecified: Secondary | ICD-10-CM

## 2019-07-06 DIAGNOSIS — Z7951 Long term (current) use of inhaled steroids: Secondary | ICD-10-CM | POA: Insufficient documentation

## 2019-07-06 DIAGNOSIS — I5033 Acute on chronic diastolic (congestive) heart failure: Secondary | ICD-10-CM | POA: Diagnosis not present

## 2019-07-06 DIAGNOSIS — M25559 Pain in unspecified hip: Secondary | ICD-10-CM | POA: Diagnosis present

## 2019-07-06 DIAGNOSIS — R1031 Right lower quadrant pain: Secondary | ICD-10-CM | POA: Insufficient documentation

## 2019-07-06 LAB — URINALYSIS, ROUTINE W REFLEX MICROSCOPIC
Bilirubin Urine: NEGATIVE
Glucose, UA: NEGATIVE mg/dL
Hgb urine dipstick: NEGATIVE
Ketones, ur: NEGATIVE mg/dL
Leukocytes,Ua: NEGATIVE
Nitrite: NEGATIVE
Protein, ur: NEGATIVE mg/dL
Specific Gravity, Urine: 1.009 (ref 1.005–1.030)
pH: 6 (ref 5.0–8.0)

## 2019-07-06 LAB — COMPREHENSIVE METABOLIC PANEL
ALT: 15 U/L (ref 0–44)
AST: 22 U/L (ref 15–41)
Albumin: 3.5 g/dL (ref 3.5–5.0)
Alkaline Phosphatase: 102 U/L (ref 38–126)
Anion gap: 11 (ref 5–15)
BUN: 23 mg/dL (ref 8–23)
CO2: 28 mmol/L (ref 22–32)
Calcium: 8.8 mg/dL — ABNORMAL LOW (ref 8.9–10.3)
Chloride: 98 mmol/L (ref 98–111)
Creatinine, Ser: 0.95 mg/dL (ref 0.44–1.00)
GFR calc Af Amer: >60 mL/min (ref >60)
GFR calc non Af Amer: 56 mL/min — ABNORMAL LOW (ref >60)
Glucose, Bld: 99 mg/dL (ref 70–99)
Potassium: 4.5 mmol/L (ref 3.5–5.1)
Sodium: 137 mmol/L (ref 135–145)
Total Bilirubin: 0.5 mg/dL (ref 0.3–1.2)
Total Protein: 7.4 g/dL (ref 6.5–8.1)

## 2019-07-06 LAB — CBC WITH DIFFERENTIAL/PLATELET
Abs Immature Granulocytes: 0.03 10*3/uL (ref 0.00–0.07)
Basophils Absolute: 0.1 10*3/uL (ref 0.0–0.1)
Basophils Relative: 1 %
Eosinophils Absolute: 0.1 10*3/uL (ref 0.0–0.5)
Eosinophils Relative: 1 %
HCT: 32 % — ABNORMAL LOW (ref 36.0–46.0)
Hemoglobin: 9.4 g/dL — ABNORMAL LOW (ref 12.0–15.0)
Immature Granulocytes: 0 %
Lymphocytes Relative: 15 %
Lymphs Abs: 1.5 10*3/uL (ref 0.7–4.0)
MCH: 26.3 pg (ref 26.0–34.0)
MCHC: 29.4 g/dL — ABNORMAL LOW (ref 30.0–36.0)
MCV: 89.6 fL (ref 80.0–100.0)
Monocytes Absolute: 1 10*3/uL (ref 0.1–1.0)
Monocytes Relative: 9 %
Neutro Abs: 7.8 10*3/uL — ABNORMAL HIGH (ref 1.7–7.7)
Neutrophils Relative %: 74 %
Platelets: 369 10*3/uL (ref 150–400)
RBC: 3.57 MIL/uL — ABNORMAL LOW (ref 3.87–5.11)
RDW: 15.2 % (ref 11.5–15.5)
WBC: 10.5 10*3/uL (ref 4.0–10.5)
nRBC: 0 % (ref 0.0–0.2)

## 2019-07-06 MED ORDER — IOHEXOL 9 MG/ML PO SOLN
ORAL | Status: AC
  Filled 2019-07-06: qty 1000

## 2019-07-06 MED ORDER — IOHEXOL 300 MG/ML  SOLN
100.0000 mL | Freq: Once | INTRAMUSCULAR | Status: AC | PRN
  Administered 2019-07-06: 100 mL via INTRAVENOUS

## 2019-07-06 MED ORDER — SODIUM CHLORIDE 0.9 % IV BOLUS
500.0000 mL | Freq: Once | INTRAVENOUS | Status: AC
  Administered 2019-07-06: 500 mL via INTRAVENOUS

## 2019-07-06 NOTE — ED Notes (Signed)
Pt assisted with ambulating to restroom and helped back into bed.

## 2019-07-06 NOTE — ED Provider Notes (Addendum)
[facility] EMERGENCY DEPARTMENT Provider Note   CSN: [id number] Arrival date & time: 07/06/19  1656     History Chief Complaint  Patient presents with   Hip Pain    [name] is a 85 y.o. female.  HPI     85 year old female with history of COPD comes in a chief complaint of hip pain.  Patient is actually complaining of right lower quadrant that is moving down towards the hip and groin and also her flank region.  Pain has been present for 2 weeks and is intermittent.  There is no specific evoking, aggravating or relieving factors.  Patient denies trauma.  She also reports that the pain is not specific to movement or activity.  Patient continues to walk with a walker.  She denies any associated nausea, vomiting, fevers, chills, UTI-like symptoms.  No history of kidney stones.  Past Medical History:  Diagnosis Date   Anxiety    Chronic diastolic (congestive) heart failure (HCC)    Chronic obstructive pulmonary disease, unspecified (HCC)    COPD (chronic obstructive pulmonary disease) (HCC)    GERD (gastroesophageal reflux disease)    Hyperlipidemia    IBS (irritable bowel syndrome)    Lumbago with sciatica    Osteoporosis    Overactive bladder    Pneumonia     Patient Active Problem List   Diagnosis Date Noted   Chronic obstructive pulmonary disease, unspecified ([place])    Gastro-esophageal reflux disease without esophagitis    Hyperlipidemia, unspecified    Pulmonary fibrosis, unspecified ([place])    Acute on chronic diastolic (congestive) heart failure (HCC)    Age-related osteoporosis without current pathological fracture    Anxiety disorder, unspecified    Chest pain, unspecified    Chronic diastolic (congestive) heart failure ([place])    Collapsed vertebra, not elsewhere classified, site unspecified, subsequent encounter for fracture with routine healing    Edema, unspecified    Lumbago with sciatica    Overactive bladder    Pain in  unspecified hand    Pressure ulcer of unspecified site, unspecified stage    Unspecified protein-calorie malnutrition ([place])    Acute diastolic heart failure ([place]) 09/30/2018   Dyspnea 09/29/2018   Pneumonia 06/30/2018   Acute CHF (congestive heart failure) ([place]) 06/29/2018   Vascular dementia without behavioral disturbance ([place]) 06/26/2018   CAP (community acquired pneumonia) 06/23/2018   Bilateral pleural effusion 06/23/2018   Leukocytosis 06/20/2018   Chronic anemia 06/20/2018   Cognitive impairment 06/19/2018   Bilateral lower extremity edema 06/18/2018   Protein-calorie malnutrition, severe ([place]) 06/18/2018   S/P internal fixation right hip fracture cannulated screw placement 04/06/18 04/27/2018   Urinary tract infection associated with catheterization of urinary tract ([place]) 04/11/2018   Altered mental status 04/10/2018   Anxiety 04/10/2018   Hyponatremia 04/10/2018   UTI (urinary tract infection) due to urinary indwelling catheter ([place]) 04/10/2018   Chronic constipation 04/09/2018   Urinary retention 04/09/2018   Closed right hip fracture ([place]) 04/05/2018   GERD (gastroesophageal reflux disease) 04/05/2018   COPD with acute exacerbation ([place]) 04/05/2018   Hiatal hernia    Dyspepsia 04/04/2014   Hematochezia 04/30/2011   RUQ pain 03/25/2011   Adenomatous polyps 03/25/2011   ABDOMINAL PAIN, LEFT LOWER QUADRANT 10/16/2009   HYPERLIPIDEMIA 10/09/2009   BRONCHITIS 10/09/2009   Osteoporosis 10/09/2009    Past Surgical History:  Procedure Laterality Date   COLONOSCOPY  10/2009   sigmoid diverticula, multiple tubulovillous adneomas, needs surveillance Oct 2013   ESOPHAGOGASTRODUODENOSCOPY  04/16/2011  Dr. Gala Romney: Noncritical Schatzkis ring( not manipulated because no dysphagia). Normal esophagus otherwise  large hiatal hernia. Gastric polyp-status post biopsy. Gastric erosions-staus post biopsy. Abnormal bulb-status post biopsy. benign small  bowel, stomach biopsy with ulcerated gastric antral mucosa with foveolar hyperplasia and surface erosion, polyp with inflamed gastric antral mucosa    ESOPHAGOGASTRODUODENOSCOPY N/A 04/29/2014   Dr. Gala Romney: Noncritical Schatzki's ring large hiatal hernia. Retained gastric contents.GES with slight delay   HIP PINNING,CANNULATED Right 04/06/2018   Procedure: CANNULATED HIP PINNING;  Surgeon: Carole Civil, MD;  Location: [facility];  Service: Orthopedics;  Laterality: Right;     OB History    Gravida  3   Para  3   Term  3   Preterm  0   AB  0   Living        SAB  0   TAB  0   Ectopic  0   Multiple      Live Births              Family History  Problem Relation Age of Onset   Stroke Mother    Diabetes Mother    Heart disease Father    Colon cancer Neg Hx     Social History   Tobacco Use   Smoking status: Passive Smoke Exposure - Never Smoker   Smokeless tobacco: Current User    Types: Snuff  Vaping Use   Vaping Use: Never used  Substance Use Topics   Alcohol use: No   Drug use: No    Home Medications Prior to Admission medications   Medication Sig Start Date End Date Taking? Authorizing Provider  acetaminophen (TYLENOL) 500 MG tablet Take 500 mg by mouth daily.   Yes [provider]  albuterol (PROAIR HFA) 108 (90 Base) MCG/ACT inhaler Inhale 2 puffs into the lungs every 4 (four) hours as needed. 06/23/18  Yes Gerlene Fee, NP  albuterol (PROVENTIL) (2.5 MG/3ML) 0.083% nebulizer solution Take 3 mLs (2.5 mg total) by nebulization every 6 (six) hours as needed for wheezing or shortness of breath. 06/23/18  Yes Gerlene Fee, NP  ALPRAZolam Duanne Moron) 0.5 MG tablet Take 0.5 mg by mouth 2 (two) times daily.   Yes [provider]  cetirizine (ZYRTEC) 10 MG tablet Take 10 mg by mouth daily as needed for allergies. 06/16/18  Yes [provider]  diclofenac sodium (VOLTAREN) 1 % GEL Apply 2 g topically 2 (two) times daily.    Yes [provider]  furosemide (LASIX) 40 MG tablet Take 40 mg by mouth daily.   Yes [provider]  linaclotide (LINZESS) 145 MCG CAPS capsule Take 1 capsule (145 mcg total) by mouth daily as needed. Patient taking differently: Take 145 mcg by mouth daily as needed (for constipation).  06/23/18  Yes Gerlene Fee, NP  Melatonin 1 MG TABS Take 5 mg by mouth daily as needed (sleep).   Yes [provider]  meloxicam (MOBIC) 7.5 MG tablet TAKE 1 TABLET BY MOUTH TWICE DAILY 06/20/19  Yes Carole Civil, MD  Multiple Vitamin (MULTIVITAMIN WITH MINERALS) TABS tablet Take 1 tablet by mouth daily.   Yes [provider]  pantoprazole (PROTONIX) 40 MG tablet TAKE (1) TABLET BY MOUTH TWICE A DAY WITH MEALS (BREAKFAST AND SUPPER) Patient taking differently: Take 40 mg by mouth 2 (two) times daily before a meal.  05/24/19  Yes Walden Field A, NP  potassium chloride (K-DUR) 10 MEQ tablet Take 1 tablet (10  mEq total) by mouth daily. 06/23/18  Yes Gerlene Fee, NP  traMADol (ULTRAM) 50 MG tablet Take 1 tablet (50 mg total) by mouth every 6 (six) hours as needed (pain). Patient taking differently: Take 50 mg by mouth 2 (two) times daily.  07/04/18  Yes [name], MD  umeclidinium-vilanterol [facility] ELLIPTA) 62.5-25 MCG/INH AEPB Inhale 1 puff into the lungs daily. 06/23/18  Yes Gerlene Fee, NP  zolpidem (AMBIEN) 5 MG tablet Take 1 tablet (5 mg total) by mouth at bedtime as needed for up to 13 days for sleep. X 14 days Patient taking differently: Take 5 mg by mouth at bedtime.  06/23/18 07/06/19 Yes Gerlene Fee, NP  tamsulosin (FLOMAX) 0.4 MG CAPS capsule Take 1 capsule (0.4 mg total) by mouth daily. 06/23/18   Gerlene Fee, NP    Allergies    Codeine, Codeine, Levofloxacin, Sulfonamide derivatives, and Sulfa antibiotics  Review of Systems   Review of Systems  Constitutional: Positive for activity change.  Gastrointestinal: Positive for abdominal pain.    Genitourinary: Negative for dysuria.  Musculoskeletal: Positive for arthralgias.  Hematological: Does not bruise/bleed easily.  All other systems reviewed and are negative.   Physical Exam Updated Vital Signs BP 134/75    Pulse 76    Temp 98 F (36.7 C) (Oral)    Resp 16    Ht 5\' 7"  (1.702 m)    Wt 48.5 kg    SpO2 91%    BMI 16.76 kg/m   Physical Exam Vitals and nursing note reviewed.  Constitutional:      Appearance: She is well-developed.  HENT:     Head: Normocephalic and atraumatic.  Cardiovascular:     Rate and Rhythm: Normal rate.  Pulmonary:     Effort: Pulmonary effort is normal.  Abdominal:     General: Bowel sounds are normal.     Tenderness: There is abdominal tenderness.     Comments: Patient has tenderness over the suprapubic and right lower quadrant region.  She is also having tenderness over the right hip posteriorly.  Strength is 4+ out of 5 for the right lower extremity  Musculoskeletal:     Cervical back: Normal range of motion and neck supple.  Skin:    General: Skin is warm and dry.  Neurological:     Mental Status: She is alert and oriented to person, place, and time.     ED Results / Procedures / Treatments   Labs (all labs ordered are listed, but only abnormal results are displayed) Labs Reviewed  CBC WITH DIFFERENTIAL/PLATELET - Abnormal; Notable for the following components:      Result Value   RBC 3.57 (*)    Hemoglobin 9.4 (*)    HCT 32.0 (*)    MCHC 29.4 (*)    Neutro Abs 7.8 (*)    All other components within normal limits  COMPREHENSIVE METABOLIC PANEL - Abnormal; Notable for the following components:   Calcium 8.8 (*)    GFR calc non Af Amer 56 (*)    All other components within normal limits  URINALYSIS, ROUTINE W REFLEX MICROSCOPIC - Abnormal; Notable for the following components:   Color, Urine STRAW (*)    All other components within normal limits    EKG None  Radiology CT ABDOMEN PELVIS W CONTRAST  Result Date:  07/06/2019 CLINICAL DATA:  Right-sided abdominal pain. Abdominal abscess/infection suspected EXAM: CT ABDOMEN AND PELVIS WITH CONTRAST TECHNIQUE: Multidetector CT imaging of the abdomen and pelvis  was performed using the standard protocol following bolus administration of intravenous contrast. CONTRAST:  171mL OMNIPAQUE IOHEXOL 300 MG/ML  SOLN COMPARISON:  CT 04/03/2018 FINDINGS: Lower chest: Increased AP dimension of the lower chest. Moderately large hiatal hernia with contrast persisting in the herniated stomach. Normal heart size displaced anteriorly by hiatal hernia. Emphysema and scarring at the lung bases. Hepatobiliary: Stable subcentimeter hypodensity in the right lobe of the liver, too small to characterize but likely cyst. No calcified gallstone or pericholecystic inflammation. There is no biliary dilatation. Pancreas: No ductal dilatation or inflammation. Spleen: Normal in size without focal abnormality. Adrenals/Urinary Tract: No adrenal nodule. Chronic prominence of both renal pelvis without hydronephrosis or perinephric edema. Punctate nonobstructing stone in the left kidney. Tiny low-density lesion in the upper right kidney is too small to characterize. No ureteral calculi. The urinary bladder is partially distended. No bladder wall thickening. Stomach/Bowel: Moderate to large hiatal hernia with enteric contrast in the herniated stomach. There is no small bowel obstruction or inflammation. Administered enteric contrast reaches the cecum. No evidence of appendicitis, the appendix is tentatively visualized and normal terminal ileum is normal. Distal colonic diverticulosis. Chronic mural hypertrophy of the sigmoid colon without diverticulitis or acute colonic inflammation. Vascular/Lymphatic: Aortic atherosclerosis without aneurysm. Patent portal vein. No enlarged lymph nodes in the abdomen or pelvis. Reproductive: Atrophic uterus, normal for age.  No adnexal mass. Other: No free air or ascites.  Distortion of upper abdominal anatomy related to exaggerated lower thoracic kyphosis. No evidence of intra-abdominal abscess. Musculoskeletal: Bones under mineralized. There are no acute or suspicious osseous abnormalities. Three screws traverse the right femoral neck without periprosthetic lucency. No hip joint effusion. IMPRESSION: 1. No acute abnormality or explanation for abdominal pain. 2. Moderate to large hiatal hernia with enteric contrast in the herniated stomach, can be seen with reflux or delayed transit. 3. Punctate nonobstructing left renal stone. 4. Colonic diverticulosis without diverticulitis. Aortic Atherosclerosis (ICD10-I70.0) and Emphysema (ICD10-J43.9). Electronically Signed   By: Keith Rake M.D.   On: 07/06/2019 21:59    Procedures Procedures (including critical care time)  Medications Ordered in ED Medications  iohexol (OMNIPAQUE) 9 MG/ML oral solution (has no administration in time range)  sodium chloride 0.9 % bolus 500 mL (0 mLs Intravenous Stopped 07/06/19 2009)  iohexol (OMNIPAQUE) 300 MG/ML solution 100 mL (100 mLs Intravenous Contrast Given 07/06/19 2145)    ED Course  I have reviewed the triage vital signs and the nursing notes.  Pertinent labs & imaging results that were available during my care of the patient were reviewed by me and considered in my medical decision making (see chart for details).  Clinical Course as of Jul 06 2226  Fri Jul 06, 2019  2228 Lab results and CT scan are reassuring.  Urine analysis is clean. Results of the ED work-up discussed with the patient.  She is stable for discharge.   [AN]  2228 Strict ER return precautions have been discussed, and patient is agreeing with the plan and is comfortable with the workup done and the recommendations from the ER.    [AN]    Clinical Course User Index [AN] Varney Biles, MD   MDM Rules/Calculators/A&P                          85 year old comes in a chief complaint of right hip  pain. On exam however she is noted to have suprapubic, right lower quadrant and right inguinal region tenderness.  No  clear evidence of hernia   Differential diagnosis includes hernia, appendicitis, kidney stones.  Occult fracture deemed to be less likely since she is walking on it.  Labs and CT scan ordered.   Final Clinical Impression(s) / ED Diagnoses Final diagnoses:  Lower abdominal pain    Rx / DC Orders ED Discharge Orders    None       Varney Biles, MD 07/06/19 [place], Karyn Brull, MD 07/06/19 2228

## 2019-07-06 NOTE — ED Notes (Signed)
Family called. On the way for pt

## 2019-07-06 NOTE — ED Triage Notes (Signed)
Pt c/o of right hip pain x 2 weeks. Full ROM and pt is ambulatory. Denies any falls or recent injury

## 2019-07-06 NOTE — Discharge Instructions (Addendum)
We saw you in the ER for the abdominal pain. All of our results are normal, including all labs and imaging. Kidney function is fine as well. We are not sure what is causing your abdominal pain, and recommend that you see your primary care doctor within 2-3 days for further evaluation. If your symptoms get worse, return to the ER.

## 2019-07-30 ENCOUNTER — Encounter: Payer: Self-pay | Admitting: Cardiology

## 2019-07-30 ENCOUNTER — Telehealth (INDEPENDENT_AMBULATORY_CARE_PROVIDER_SITE_OTHER): Payer: Medicare Other | Admitting: Cardiology

## 2019-07-30 ENCOUNTER — Other Ambulatory Visit: Payer: Self-pay

## 2019-07-30 VITALS — BP 110/60 | HR 84 | Wt 104.0 lb

## 2019-07-30 DIAGNOSIS — I5032 Chronic diastolic (congestive) heart failure: Secondary | ICD-10-CM | POA: Diagnosis not present

## 2019-07-30 NOTE — Progress Notes (Signed)
Virtual Visit via Telephone Note   This visit type was conducted due to national recommendations for restrictions regarding the COVID-19 Pandemic (e.g. social distancing) in an effort to limit this patient's exposure and mitigate transmission in our community.  Due to her co-morbid illnesses, this patient is at least at moderate risk for complications without adequate follow up.  This format is felt to be most appropriate for this patient at this time.  The patient did not have access to video technology/had technical difficulties with video requiring transitioning to audio format only (telephone).  All issues noted in this document were discussed and addressed.  No physical exam could be performed with this format.  Please refer to the patient's chart for her  consent to telehealth for [facility].    Date:  07/30/2019   ID:  [name], DOB 11/28/1934, MRN [id number] The patient was identified using 2 identifiers.  Patient Location: Home Provider Location: Office/Clinic  PCP:  Alanson Puls [facility]  Cardiologist:  Rozann Lesches, MD Electrophysiologist:  None   Evaluation Performed:  Follow-Up Visit  Chief Complaint:   Cardiac follow-up  History of Present Illness:    [name] is an 85 y.o. female last assessed via telehealth encounter in January. I spoke with her daughter Kalman Shan today as Ms. Ackroyd is quite hard of hearing and cannot speak on the phone easily. Her daughter tells me that weight has been reasonably stable on Lasix 40 mg daily with potassium supplement. This was cut down by PCP previously, but she regained fluid weight and had to go back up on the dose. I did review her recent lab work from July as outlined below.  I reviewed the remainder of her medications as outlined below. Blood pressure low normal today.  Patient reportedly has trouble with nocturnal leg cramps, also chronic osteoporosis and arthritic pain.  Past Medical History:  Diagnosis Date  .  Anxiety   . Chronic diastolic (congestive) heart failure ([place])   . Chronic obstructive pulmonary disease, unspecified ([place])   . COPD (chronic obstructive pulmonary disease) ([place])   . GERD (gastroesophageal reflux disease)   . Hyperlipidemia   . IBS (irritable bowel syndrome)   . Lumbago with sciatica   . Osteoporosis   . Overactive bladder   . Pneumonia    Past Surgical History:  Procedure Laterality Date  . COLONOSCOPY  10/2009   sigmoid diverticula, multiple tubulovillous adneomas, needs surveillance Oct 2013  . ESOPHAGOGASTRODUODENOSCOPY  04/16/2011   Dr. Gala Romney: Noncritical Schatzkis ring( not manipulated because no dysphagia). Normal esophagus otherwise  large hiatal hernia. Gastric polyp-status post biopsy. Gastric erosions-staus post biopsy. Abnormal bulb-status post biopsy. benign small bowel, stomach biopsy with ulcerated gastric antral mucosa with foveolar hyperplasia and surface erosion, polyp with inflamed gastric antral mucosa   . ESOPHAGOGASTRODUODENOSCOPY N/A 04/29/2014   Dr. Gala Romney: Noncritical Schatzki's ring large hiatal hernia. Retained gastric contents.GES with slight delay  . HIP PINNING,CANNULATED Right 04/06/2018   Procedure: CANNULATED HIP PINNING;  Surgeon: Carole Civil, MD;  Location: [facility];  Service: Orthopedics;  Laterality: Right;     Current Meds  Medication Sig  . acetaminophen (TYLENOL) 500 MG tablet Take 500 mg by mouth daily.  Marland Kitchen albuterol (PROAIR HFA) 108 (90 Base) MCG/ACT inhaler Inhale 2 puffs into the lungs every 4 (four) hours as needed.  Marland Kitchen albuterol (PROVENTIL) (2.5 MG/3ML) 0.083% nebulizer solution Take 3 mLs (2.5 mg total) by nebulization every 6 (six) hours as needed for wheezing or  shortness of breath.  . ALPRAZolam (XANAX) 0.5 MG tablet Take 0.5 mg by mouth 2 (two) times daily.  . cetirizine (ZYRTEC) 10 MG tablet Take 10 mg by mouth daily as needed for allergies.  Marland Kitchen diclofenac sodium (VOLTAREN) 1 % GEL Apply 2 g topically 2 (two) times  daily.  Marland Kitchen donepezil (ARICEPT) 10 MG tablet Take 10 mg by mouth daily.  . furosemide (LASIX) 40 MG tablet Take 40 mg by mouth daily.  Marland Kitchen linaclotide (LINZESS) 145 MCG CAPS capsule Take 1 capsule (145 mcg total) by mouth daily as needed. (Patient taking differently: Take 145 mcg by mouth daily as needed (for constipation). )  . meloxicam (MOBIC) 7.5 MG tablet TAKE 1 TABLET BY MOUTH TWICE DAILY  . Multiple Vitamin (MULTIVITAMIN WITH MINERALS) TABS tablet Take 1 tablet by mouth daily.  . pantoprazole (PROTONIX) 40 MG tablet TAKE (1) TABLET BY MOUTH TWICE A DAY WITH MEALS (BREAKFAST AND SUPPER) (Patient taking differently: Take 40 mg by mouth 2 (two) times daily before a meal. )  . potassium chloride (K-DUR) 10 MEQ tablet Take 1 tablet (10 mEq total) by mouth daily.  . tamsulosin (FLOMAX) 0.4 MG CAPS capsule Take 1 capsule (0.4 mg total) by mouth daily.  . traMADol (ULTRAM) 50 MG tablet Take 1 tablet (50 mg total) by mouth every 6 (six) hours as needed (pain). (Patient taking differently: Take 50 mg by mouth 2 (two) times daily. )  . umeclidinium-vilanterol (ANORO ELLIPTA) 62.5-25 MCG/INH AEPB Inhale 1 puff into the lungs daily.  Marland Kitchen zolpidem (AMBIEN) 5 MG tablet Take 1 tablet (5 mg total) by mouth at bedtime as needed for up to 13 days for sleep. X 14 days (Patient taking differently: Take 5 mg by mouth at bedtime. )  . [DISCONTINUED] Melatonin 1 MG TABS Take 5 mg by mouth daily as needed (sleep).     Allergies:   Codeine, Codeine, Levofloxacin, Sulfonamide derivatives, and Sulfa antibiotics   ROS:   Arthritic pain.  Prior CV studies:   The following studies were reviewed today:  Echocardiogram 09/30/2018: 1. Left ventricular ejection fraction, by visual estimation, is 60 to 65%. The left ventricle has normal function. Normal left ventricular size. Normal left ventricular posterior wall thickness. There is no left ventricular hypertrophy. 2. Left ventricular diastolic Doppler parameters are  indeterminate pattern of LV diastolic filling. 3. There is mild basal septal LV hypertrophy. 4. Global right ventricle has normal systolic function.The right ventricular size is normal. No increase in right ventricular wall thickness. 5. Left atrial size was moderately dilated. 6. Right atrial size was mildly dilated. 7. The mitral valve is normal in structure. Mild mitral valve regurgitation. No evidence of mitral stenosis. 8. The tricuspid valve is normal in structure. Tricuspid valve regurgitation moderate. 9. The aortic valve is tricuspid Aortic valve regurgitation was not visualized by color flow Doppler. Mild aortic valve sclerosis without stenosis. 10. The pulmonic valve was not well visualized. Pulmonic valve regurgitation is not visualized by color flow Doppler. 11. Moderately elevated pulmonary artery systolic pressure. 12. Pulmonary HTN is moderate, PASP is 43 mmHg. 13. The inferior vena cava is normal in size with greater than 50% respiratory variability, suggesting right atrial pressure of 3 mmHg.  Labs/Other Tests and Data Reviewed:    EKG:  An ECG dated 09/29/2018 was personally reviewed today and demonstrated:  Normal sinus rhythm.  Recent Labs: 09/30/2018: TSH 1.346 10/18/2018: B Natriuretic Peptide 48.0 07/06/2019: ALT 15; BUN 23; Creatinine, Ser 0.95; Hemoglobin 9.4; Platelets 369; Potassium  4.5; Sodium 137   Recent Lipid Panel Lab Results  Component Value Date/Time   CHOL 261 (A) 05/16/2017 12:00 AM   TRIG 101 05/16/2017 12:00 AM   HDL 90 (A) 05/16/2017 12:00 AM   LDLCALC 150 05/16/2017 12:00 AM    Wt Readings from Last 3 Encounters:  07/30/19 104 lb (47.2 kg)  07/06/19 107 lb (48.5 kg)  01/25/19 107 lb (48.5 kg)     Objective:    Vital Signs:  BP (!) 110/60   Pulse 84   Wt 104 lb (47.2 kg)   SpO2 96%   BMI 16.29 kg/m    I spoke with patient's daughter during the entirety of the visit.  ASSESSMENT & PLAN:    1. Chronic diastolic heart failure.  Weight has been stable on Lasix 40 mg daily with KCl 10 mEq daily. Recent lab work reviewed above. LVEF 60 to 65% by echocardiogram in September 2020. No changes made to current regimen.  2. Osteoporosis and arthritis with chronic pain.  Time:   Today, I have spent 8 minutes with the patient with telehealth technology discussing the above problems.     Medication Adjustments/Labs and Tests Ordered: Current medicines are reviewed at length with the patient today.  Concerns regarding medicines are outlined above.   Tests Ordered: No orders of the defined types were placed in this encounter.   Medication Changes: No orders of the defined types were placed in this encounter.   Follow Up:  Virtual Visit  6 months.  Signed, Rozann Lesches, MD  07/30/2019 1:57 PM    [place]

## 2019-07-30 NOTE — Patient Instructions (Signed)
Medication Instructions:  °Your physician recommends that you continue on your current medications as directed. Please refer to the Current Medication list given to you today. ° °*If you need a refill on your cardiac medications before your next appointment, please call your pharmacy* ° ° °Lab Work: °None today °If you have labs (blood work) drawn today and your tests are completely normal, you will receive your results only by: °• MyChart Message (if you have MyChart) OR °• A paper copy in the mail °If you have any lab test that is abnormal or we need to change your treatment, we will call you to review the results. ° ° °Testing/Procedures: °None today ° ° °Follow-Up: °At [facility], you and your health needs are our priority.  As part of our continuing mission to provide you with exceptional heart care, we have created designated Provider Care Teams.  These Care Teams include your primary Cardiologist (physician) and Advanced Practice Providers (APPs -  Physician Assistants and Nurse Practitioners) who all work together to provide you with the care you need, when you need it. ° °We recommend signing up for the patient portal called "MyChart".  Sign up information is provided on this After Visit Summary.  MyChart is used to connect with patients for Virtual Visits (Telemedicine).  Patients are able to view lab/test results, encounter notes, upcoming appointments, etc.  Non-urgent messages can be sent to your provider as well.   °To learn more about what you can do with MyChart, go to [email or web address].   ° °Your next appointment:   °6 month(s) ° °The format for your next appointment:   °In Person ° °Provider:   °Samuel McDowell, MD ° ° °Other Instructions °None ° ° ° ° °Thank you for choosing [facility] ! ° ° ° ° ° ° ° ° °

## 2019-08-16 ENCOUNTER — Other Ambulatory Visit: Payer: Self-pay | Admitting: Orthopedic Surgery

## 2019-08-20 DIAGNOSIS — M25561 Pain in right knee: Secondary | ICD-10-CM | POA: Diagnosis not present

## 2019-08-20 DIAGNOSIS — Z5181 Encounter for therapeutic drug level monitoring: Secondary | ICD-10-CM | POA: Diagnosis not present

## 2019-08-20 DIAGNOSIS — M545 Low back pain: Secondary | ICD-10-CM | POA: Diagnosis not present

## 2019-08-28 DIAGNOSIS — R251 Tremor, unspecified: Secondary | ICD-10-CM | POA: Diagnosis not present

## 2019-08-28 DIAGNOSIS — D649 Anemia, unspecified: Secondary | ICD-10-CM | POA: Diagnosis not present

## 2019-08-28 DIAGNOSIS — R278 Other lack of coordination: Secondary | ICD-10-CM | POA: Diagnosis not present

## 2019-08-28 DIAGNOSIS — F482 Pseudobulbar affect: Secondary | ICD-10-CM | POA: Diagnosis not present

## 2019-08-28 DIAGNOSIS — Z79899 Other long term (current) drug therapy: Secondary | ICD-10-CM | POA: Diagnosis not present

## 2019-08-28 DIAGNOSIS — R7303 Prediabetes: Secondary | ICD-10-CM | POA: Diagnosis not present

## 2019-08-28 DIAGNOSIS — E785 Hyperlipidemia, unspecified: Secondary | ICD-10-CM | POA: Diagnosis not present

## 2019-08-28 DIAGNOSIS — M130 Polyarthritis, unspecified: Secondary | ICD-10-CM | POA: Diagnosis not present

## 2019-08-28 DIAGNOSIS — F0151 Vascular dementia with behavioral disturbance: Secondary | ICD-10-CM | POA: Diagnosis not present

## 2019-08-28 DIAGNOSIS — G309 Alzheimer's disease, unspecified: Secondary | ICD-10-CM | POA: Diagnosis not present

## 2019-09-05 DIAGNOSIS — M810 Age-related osteoporosis without current pathological fracture: Secondary | ICD-10-CM | POA: Diagnosis not present

## 2019-09-05 DIAGNOSIS — Z79891 Long term (current) use of opiate analgesic: Secondary | ICD-10-CM | POA: Diagnosis not present

## 2019-09-05 DIAGNOSIS — G4700 Insomnia, unspecified: Secondary | ICD-10-CM | POA: Diagnosis not present

## 2019-09-05 DIAGNOSIS — J449 Chronic obstructive pulmonary disease, unspecified: Secondary | ICD-10-CM | POA: Diagnosis not present

## 2019-09-05 DIAGNOSIS — I5032 Chronic diastolic (congestive) heart failure: Secondary | ICD-10-CM | POA: Diagnosis not present

## 2019-09-05 DIAGNOSIS — R7303 Prediabetes: Secondary | ICD-10-CM | POA: Diagnosis not present

## 2019-09-05 DIAGNOSIS — E785 Hyperlipidemia, unspecified: Secondary | ICD-10-CM | POA: Diagnosis not present

## 2019-09-05 DIAGNOSIS — M25561 Pain in right knee: Secondary | ICD-10-CM | POA: Diagnosis not present

## 2019-09-05 DIAGNOSIS — M25562 Pain in left knee: Secondary | ICD-10-CM | POA: Diagnosis not present

## 2019-09-05 DIAGNOSIS — M545 Low back pain: Secondary | ICD-10-CM | POA: Diagnosis not present

## 2019-09-05 DIAGNOSIS — J8410 Pulmonary fibrosis, unspecified: Secondary | ICD-10-CM | POA: Diagnosis not present

## 2019-09-05 DIAGNOSIS — F419 Anxiety disorder, unspecified: Secondary | ICD-10-CM | POA: Diagnosis not present

## 2019-09-18 ENCOUNTER — Other Ambulatory Visit: Payer: Self-pay | Admitting: Orthopedic Surgery

## 2019-11-10 ENCOUNTER — Encounter (HOSPITAL_COMMUNITY): Payer: Self-pay | Admitting: *Deleted

## 2019-11-10 ENCOUNTER — Other Ambulatory Visit: Payer: Self-pay

## 2019-11-10 ENCOUNTER — Emergency Department (HOSPITAL_COMMUNITY): Payer: Medicare Other

## 2019-11-10 ENCOUNTER — Emergency Department (HOSPITAL_COMMUNITY)
Admission: EM | Admit: 2019-11-10 | Discharge: 2019-11-10 | Disposition: A | Discharge status: Home / Self Care | Payer: Medicare Other | Attending: Emergency Medicine | Admitting: Emergency Medicine

## 2019-11-10 DIAGNOSIS — S32010A Wedge compression fracture of first lumbar vertebra, initial encounter for closed fracture: Secondary | ICD-10-CM | POA: Insufficient documentation

## 2019-11-10 DIAGNOSIS — K449 Diaphragmatic hernia without obstruction or gangrene: Secondary | ICD-10-CM | POA: Diagnosis not present

## 2019-11-10 DIAGNOSIS — I708 Atherosclerosis of other arteries: Secondary | ICD-10-CM | POA: Diagnosis not present

## 2019-11-10 DIAGNOSIS — F0150 Vascular dementia without behavioral disturbance: Secondary | ICD-10-CM | POA: Diagnosis not present

## 2019-11-10 DIAGNOSIS — N200 Calculus of kidney: Secondary | ICD-10-CM | POA: Diagnosis not present

## 2019-11-10 DIAGNOSIS — X58XXXA Exposure to other specified factors, initial encounter: Secondary | ICD-10-CM | POA: Insufficient documentation

## 2019-11-10 DIAGNOSIS — R110 Nausea: Secondary | ICD-10-CM | POA: Diagnosis not present

## 2019-11-10 DIAGNOSIS — S32020A Wedge compression fracture of second lumbar vertebra, initial encounter for closed fracture: Secondary | ICD-10-CM | POA: Diagnosis not present

## 2019-11-10 DIAGNOSIS — R109 Unspecified abdominal pain: Secondary | ICD-10-CM | POA: Insufficient documentation

## 2019-11-10 DIAGNOSIS — Z7722 Contact with and (suspected) exposure to environmental tobacco smoke (acute) (chronic): Secondary | ICD-10-CM | POA: Insufficient documentation

## 2019-11-10 DIAGNOSIS — I5033 Acute on chronic diastolic (congestive) heart failure: Secondary | ICD-10-CM | POA: Diagnosis not present

## 2019-11-10 DIAGNOSIS — S3992XA Unspecified injury of lower back, initial encounter: Secondary | ICD-10-CM | POA: Diagnosis present

## 2019-11-10 DIAGNOSIS — M545 Low back pain, unspecified: Secondary | ICD-10-CM | POA: Diagnosis not present

## 2019-11-10 DIAGNOSIS — J441 Chronic obstructive pulmonary disease with (acute) exacerbation: Secondary | ICD-10-CM | POA: Diagnosis not present

## 2019-11-10 LAB — URINALYSIS, ROUTINE W REFLEX MICROSCOPIC
Bilirubin Urine: NEGATIVE
Glucose, UA: NEGATIVE mg/dL
Hgb urine dipstick: NEGATIVE
Ketones, ur: NEGATIVE mg/dL
Leukocytes,Ua: NEGATIVE
Nitrite: NEGATIVE
Protein, ur: NEGATIVE mg/dL
Specific Gravity, Urine: 1.013 (ref 1.005–1.030)
pH: 6 (ref 5.0–8.0)

## 2019-11-10 MED ORDER — LIDOCAINE 5 % EX OINT: 1.0000 "application " | TOPICAL_OINTMENT | Freq: Four times a day (QID) | CUTANEOUS | 0 refills | Status: DC | PRN

## 2019-11-10 MED ORDER — LIDOCAINE 5 % EX PTCH
1.0000 | MEDICATED_PATCH | CUTANEOUS | Status: DC
  Administered 2019-11-10: 1 via TRANSDERMAL
  Filled 2019-11-10 (×2): qty 1

## 2019-11-10 NOTE — ED Provider Notes (Signed)
Emergency Department Provider Note   I have reviewed the triage vital signs and the nursing notes.   HISTORY  Chief Complaint Back Pain   HPI [name] is a 85 y.o. female with PMH reviewed below presents to the emergency department with acute on chronic lower back pain.  Patient tells me she has had approximately 4 days of worsening symptoms with no clear provoking factors.  Denies known injury or fall.  She is not experiencing radiation of pain into her legs.  She has some mild discomfort around her flank on the left side but pain is mostly below her waistband in her lower back.  She is not experiencing any urinary retention or incontinence symptoms.  No groin numbness.  No pain or numbness in the lower extremities.  No fevers.  Patient tells me that she typically takes Tylenol and sometimes tramadol for pain at home.  She ambulates typically with a walker.  She lives with her daughter and is able to continue her ADLs with this pain but it is uncomfortable.   Past Medical History:  Diagnosis Date   Anxiety    Chronic diastolic (congestive) heart failure (HCC)    Chronic obstructive pulmonary disease, unspecified (HCC)    COPD (chronic obstructive pulmonary disease) (HCC)    GERD (gastroesophageal reflux disease)    Hyperlipidemia    IBS (irritable bowel syndrome)    Lumbago with sciatica    Osteoporosis    Overactive bladder    Pneumonia     Patient Active Problem List   Diagnosis Date Noted   Chronic obstructive pulmonary disease, unspecified ([place])    Gastro-esophageal reflux disease without esophagitis    Hyperlipidemia, unspecified    Pulmonary fibrosis, unspecified (HCC)    Acute on chronic diastolic (congestive) heart failure (HCC)    Age-related osteoporosis without current pathological fracture    Anxiety disorder, unspecified    Chest pain, unspecified    Chronic diastolic (congestive) heart failure (HCC)    Collapsed vertebra, not  elsewhere classified, site unspecified, subsequent encounter for fracture with routine healing    Edema, unspecified    Lumbago with sciatica    Overactive bladder    Pain in unspecified hand    Pressure ulcer of unspecified site, unspecified stage    Unspecified protein-calorie malnutrition ([place])    Acute diastolic heart failure ([place]) 09/30/2018   Dyspnea 09/29/2018   Pneumonia 06/30/2018   Acute CHF (congestive heart failure) ([place]) 06/29/2018   Vascular dementia without behavioral disturbance ([place]) 06/26/2018   CAP (community acquired pneumonia) 06/23/2018   Bilateral pleural effusion 06/23/2018   Leukocytosis 06/20/2018   Chronic anemia 06/20/2018   Cognitive impairment 06/19/2018   Bilateral lower extremity edema 06/18/2018   Protein-calorie malnutrition, severe ([place]) 06/18/2018   S/P internal fixation right hip fracture cannulated screw placement 04/06/18 04/27/2018   Urinary tract infection associated with catheterization of urinary tract ([place]) 04/11/2018   Altered mental status 04/10/2018   Anxiety 04/10/2018   Hyponatremia 04/10/2018   UTI (urinary tract infection) due to urinary indwelling catheter ([place]) 04/10/2018   Chronic constipation 04/09/2018   Urinary retention 04/09/2018   Closed right hip fracture ([place]) 04/05/2018   GERD (gastroesophageal reflux disease) 04/05/2018   COPD with acute exacerbation ([place]) 04/05/2018   Hiatal hernia    Dyspepsia 04/04/2014   Hematochezia 04/30/2011   RUQ pain 03/25/2011   Adenomatous polyps 03/25/2011   ABDOMINAL PAIN, LEFT LOWER QUADRANT 10/16/2009   HYPERLIPIDEMIA 10/09/2009   BRONCHITIS 10/09/2009  Osteoporosis 10/09/2009    Past Surgical History:  Procedure Laterality Date   COLONOSCOPY  10/2009   sigmoid diverticula, multiple tubulovillous adneomas, needs surveillance Oct 2013   ESOPHAGOGASTRODUODENOSCOPY  04/16/2011   Dr. Gala Romney: Noncritical Schatzkis ring( not manipulated because  no dysphagia). Normal esophagus otherwise  large hiatal hernia. Gastric polyp-status post biopsy. Gastric erosions-staus post biopsy. Abnormal bulb-status post biopsy. benign small bowel, stomach biopsy with ulcerated gastric antral mucosa with foveolar hyperplasia and surface erosion, polyp with inflamed gastric antral mucosa    ESOPHAGOGASTRODUODENOSCOPY N/A 04/29/2014   Dr. Gala Romney: Noncritical Schatzki's ring large hiatal hernia. Retained gastric contents.GES with slight delay   HIP PINNING,CANNULATED Right 04/06/2018   Procedure: CANNULATED HIP PINNING;  Surgeon: Carole Civil, MD;  Location: [facility];  Service: Orthopedics;  Laterality: Right;    Allergies Codeine, Codeine, Levofloxacin, Sulfonamide derivatives, and Sulfa antibiotics  Family History  Problem Relation Age of Onset   Stroke Mother    Diabetes Mother    Heart disease Father    Colon cancer Neg Hx     Social History Social History   Tobacco Use   Smoking status: Passive Smoke Exposure - Never Smoker   Smokeless tobacco: Current User    Types: Snuff  Vaping Use   Vaping Use: Never used  Substance Use Topics   Alcohol use: No   Drug use: No    Review of Systems  Constitutional: No fever/chills Eyes: No visual changes. ENT: No sore throat. Cardiovascular: Denies chest pain. Respiratory: Denies shortness of breath. Gastrointestinal: No abdominal pain.  No nausea, no vomiting.  No diarrhea.  No constipation. Genitourinary: Negative for dysuria. Musculoskeletal: Positive for back pain. Skin: Negative for rash. Neurological: Negative for headaches, focal weakness or numbness.  10-point ROS otherwise negative.  ____________________________________________   PHYSICAL EXAM:  VITAL SIGNS: ED Triage Vitals  Enc Vitals Group     BP 11/10/19 1106 (!) 158/93     Pulse Rate 11/10/19 1106 83     Resp 11/10/19 1106 18     Temp 11/10/19 1106 97.8 F (36.6 C)     Temp Source 11/10/19 1106 Oral      SpO2 11/10/19 1106 95 %   Constitutional: Alert and oriented. Well appearing and in no acute distress. Eyes: Conjunctivae are normal. Head: Atraumatic. Nose: No congestion/rhinnorhea. Mouth/Throat: Mucous membranes are moist. Neck: No stridor.   Cardiovascular: Normal rate, regular rhythm. Good peripheral circulation. Grossly normal heart sounds.   Respiratory: Normal respiratory effort.  No retractions. Lungs CTAB. Gastrointestinal: Soft and nontender. No distention.  Musculoskeletal: No lower extremity tenderness nor edema. No gross deformities of extremities. Midline and paraspinal tenderness on palpation of the lumbar spine.  Neurologic:  Normal speech and language. No gross focal neurologic deficits are appreciated. Normal strength and sensation in the bilateral LEs.  Skin:  Skin is warm, dry and intact. No rash noted.  ____________________________________________   LABS (all labs ordered are listed, but only abnormal results are displayed)  Labs Reviewed  URINE CULTURE  URINALYSIS, ROUTINE W REFLEX MICROSCOPIC   ____________________________________________  RADIOLOGY  CT Renal Stone Study  Result Date: 11/10/2019 CLINICAL DATA:  Flank pain, BILATERAL back pain and nausea for 4 days, suspected kidney stone EXAM: CT ABDOMEN AND PELVIS WITHOUT CONTRAST TECHNIQUE: Multidetector CT imaging of the abdomen and pelvis was performed following the standard protocol without IV contrast. Sagittal and coronal MPR images reconstructed from axial data set. No oral contrast administered. COMPARISON:  07/06/2019 FINDINGS: Lower chest: Emphysematous and bronchitic  changes at lung bases. Peripheral subpleural chronic interstitial lung disease changes, slightly increased. Posterior LEFT diaphragmatic defect with upper pole of LEFT kidney extending into. Hepatobiliary: Contracted gallbladder.  Liver unremarkable. Pancreas: Normal appearance Spleen: Normal appearance Adrenals/Urinary Tract: Adrenal  glands normal appearance. Tiny nonobstructing LEFT renal calculus. No definite renal mass or hydronephrosis. Ureters are poorly visualized but no calcifications are seen along the expected courses of the ureters. Bladder unremarkable. Stomach/Bowel: Moderate-sized hiatal hernia. Distal gastric antrum collapsed, wall thickness poorly assessed. Sigmoid diverticulosis without evidence of diverticulitis. Remaining bowel loops unremarkable. Normal appendix best visualized on coronal images. Vascular/Lymphatic: Atherosclerotic calcifications aorta, iliac arteries, coronary arteries. Aorta normal caliber. No definite adenopathy. Reproductive: Unremarkable uterus and adnexa Other: No free air or free fluid. No hernia. No definite inflammatory process. Musculoskeletal: Osseous demineralization. Superior endplate compression fracture L1 with minimal anterior height loss and slight retropulsion of a posterior fragment, new. Greater superior endplate compression fracture involving L2 with mild anterior height loss, pronounced central endplate concavity, and retropulsion of a dorsal posterior fragment. IMPRESSION: Tiny nonobstructing LEFT renal calculus. Sigmoid diverticulosis without evidence of diverticulitis. Moderate-sized hiatal hernia. Chronic interstitial lung disease changes at lung bases, increased. Compression fractures of L1 and L2 vertebral bodies, new since prior exam, likely acute to subacute. No acute intra-abdominal or intrapelvic abnormalities. Aortic Atherosclerosis (ICD10-I70.0) and Emphysema (ICD10-J43.9). Electronically Signed   By: Lavonia Dana M.D.   On: 11/10/2019 12:17    ____________________________________________   PROCEDURES  Procedure(s) performed:   Procedures  None  ____________________________________________   INITIAL IMPRESSION / ASSESSMENT AND PLAN / ED COURSE  Pertinent labs & imaging results that were available during my care of the patient were reviewed by me and considered  in my medical decision making (see chart for details).   Patient presents to the emergency department for evaluation of back pain which is chronic but worsening over the past 4 days.  I do not appreciate any focal neurologic deficits.  No red flag signs or symptoms to suspect acute spine emergency requiring emergent MRI and/or Neurosurgery evaluation/consultation. Plan for CT renal, UA, and primarily topical pain medication (Lidoderm) which has been ordered.   Compression fx at L1/2. Minimal height loss. No neuro deficits on exam. Plan for TLSO brace and discharge. Patient would prefer to f/u with Dr. Aline Brochure who is local and she has seen before. Unsure if this is within his scope so also gave contact for [facility] on call. Dr. Sherlyn Lick. Patient with tylenol and Tramadol at home for pain. Called in Rx for lidocaine ointment as well. Discussed ED return precautions.  ____________________________________________  FINAL CLINICAL IMPRESSION(S) / ED DIAGNOSES  Final diagnoses:  Closed compression fracture of L1 vertebra, initial encounter ([place])  Closed compression fracture of L2 vertebra, initial encounter ([place])  Acute midline low back pain without sciatica    NEW OUTPATIENT MEDICATIONS STARTED DURING THIS VISIT:  Discharge Medication List as of 11/10/2019  4:26 PM    START taking these medications   Details  lidocaine (XYLOCAINE) 5 % ointment Apply 1 application topically 4 (four) times daily as needed., Starting Sat 11/10/2019, Normal        Note:  This document was prepared using Dragon voice recognition software and may include unintentional dictation errors.  Nanda Quinton, MD, [facility]    Ayan Yankey, [facility], MD 11/11/19 [phone]

## 2019-11-10 NOTE — ED Triage Notes (Signed)
Chronic back pain, worse for the past 4 days

## 2019-11-10 NOTE — ED Notes (Signed)
Update from [place].  Will be here in about an hour.

## 2019-11-10 NOTE — Progress Notes (Signed)
Orthopedic Tech Progress Note Patient Details:  MAMTA RIMMER 06-04-1934 [id number] Called in brace Patient ID: [name], female   DOB: 1934/09/16, 85 y.o.   MRN: [id number]   Ellouise Newer 11/10/2019, 1:33 PM

## 2019-11-10 NOTE — Discharge Instructions (Signed)
You were seen in the emergency department today with small, compression fractures in your lower back. I am sending you home with a brace to wear as needed for comfort especially when up and walking around. You can take it off for sleeping or for bathing. I have listed name of a spine specialist on the discharge paperwork here. Please call that office on Monday to schedule a follow-up appointment in the next 2 weeks. You can continue using your tramadol and Tylenol as needed for pain. I have called in some lidocaine ointment which you can rub in your lower back to help with pain symptoms as well. We discussed that you have seen Dr. Aline Brochure in the past. I have listed his number here. You can call his office and see if he is comfortable managing this issue for you but he may ultimately recommend that you be seen by the spine specialist.

## 2019-11-10 NOTE — ED Notes (Signed)
Called for TLSO Brace.  They will place the order and someone should be here in an hour or so.

## 2019-11-10 NOTE — ED Notes (Signed)
Pt back from CT requested urine again from her, she states she is still unable to provide me a sample.

## 2019-11-10 NOTE — ED Notes (Signed)
Urine requested from patient, she states she is unable to provide me a sample at this time.

## 2019-11-12 LAB — URINE CULTURE: Culture: NO GROWTH

## 2019-11-15 ENCOUNTER — Ambulatory Visit (INDEPENDENT_AMBULATORY_CARE_PROVIDER_SITE_OTHER): Payer: Medicare Other | Admitting: Orthopedic Surgery

## 2019-11-15 ENCOUNTER — Encounter: Payer: Self-pay | Admitting: Orthopedic Surgery

## 2019-11-15 ENCOUNTER — Other Ambulatory Visit: Payer: Self-pay

## 2019-11-15 VITALS — BP 132/90 | HR 80 | Ht <= 58 in | Wt 104.0 lb

## 2019-11-15 DIAGNOSIS — S335XXA Sprain of ligaments of lumbar spine, initial encounter: Secondary | ICD-10-CM | POA: Diagnosis not present

## 2019-11-15 MED ORDER — TIZANIDINE HCL 4 MG PO TABS: 4.0000 mg | ORAL_TABLET | Freq: Every day | ORAL | 1 refills | Status: DC

## 2019-11-15 MED ORDER — HYDROCODONE-ACETAMINOPHEN 5-325 MG PO TABS
1.0000 | ORAL_TABLET | Freq: Four times a day (QID) | ORAL | 0 refills | Status: DC | PRN
Start: 2019-11-15 — End: 2021-05-16

## 2019-11-15 NOTE — Patient Instructions (Addendum)
Use heat on the back   Take the medication as ordered   Call if not better in 3 weeks  Lumbar Sprain A lumbar sprain, which is sometimes called a low-back sprain, is a stretch or tear in the bands of tissue that hold bones and joints together (ligaments) in the lower back (lumbar spine). This type of injury occurs when you overextend or stretch these ligaments beyond their limits. Lumbar sprains can range from mild to severe. Mild sprains may involve stretching a ligament without tearing it. These may heal in 1-2 weeks. More severe sprains involve tearing of the ligament. These will cause more pain and may take 6-8 weeks to heal. What are the causes? This condition may be caused by:  Trauma, such as a fall or a hit to the body.  Twisting or overstretching the back. This may result from doing activities that need a lot of energy, such as lifting heavy objects. What increases the risk? This injury is more common in:  Athletes.  People with obesity.  People who do repeated lifting, bending, or other movements that involve their back. What are the signs or symptoms? Symptoms of this condition may include:  Sharp or dull pain in the lower back that does not go away. The pain may extend to the buttocks.  Stiffness or limited range of motion.  Sudden muscle tightening (spasms). How is this diagnosed? This condition may be diagnosed based on:  Your symptoms.  Your medical history.  A physical exam.  Imaging tests, such as: ? X-rays. ? MRI. How is this treated? Treatment for this condition may include:  Resting the injured area.  Applying heat and cold to the affected area.  Medicines for pain and inflammation, such as NSAIDs.  Prescription pain medicine and muscle relaxants may be needed for a short time.  Physical therapy. Follow these instructions at home: Managing pain, stiffness, and swelling      If directed, put ice on the injured area during the first 24  hours after your injury. ? Put ice in a plastic bag. ? Place a towel between your skin and the bag. ? Leave the ice on for 20 minutes, 2-3 times a day.  If directed, apply heat to the affected area as often as told by your health care provider. Use the heat source that your health care provider recommends, such as a moist heat pack or a heating pad. ? Place a towel between your skin and the heat source. ? Leave the heat on for 20-30 minutes. ? Remove the heat if your skin turns bright red. This is especially important if you are unable to feel pain, heat, or cold. You may have a greater risk of getting burned. Activity  Rest and return to your normal activities as told by your health care provider. Ask your health care provider what activities are safe for you.  Do exercises as told by your health care provider. Medicines  Take over-the-counter and prescription medicines only as told by your health care provider.  Ask your health care provider if the medicine prescribed to you: ? Requires you to avoid driving or using heavy machinery. ? Can cause constipation. You may need to take these actions to prevent or treat constipation:  Drink enough fluid to keep your urine pale yellow.  Take over-the-counter or prescription medicines.  Eat foods that are high in fiber, such as beans, whole grains, and fresh fruits and vegetables.  Limit foods that are high in fat  and processed sugars, such as fried or sweet foods. Injury prevention To prevent a future low-back injury:  Always warm up properly before physical activity or sports.  Cool down and stretch after being active.  Use correct form when playing sports and lifting heavy objects. Bend your knees before you lift heavy objects.  Use good posture when sitting and standing.  Stay physically fit and keep a healthy weight. ? Do at least 150 minutes of moderate-intensity exercise each week, such as brisk walking or water  aerobics. ? Do strength exercises at least 2 times each week.  General instructions  Do not use any products that contain nicotine or tobacco, such as cigarettes, e-cigarettes, and chewing tobacco. If you need help quitting, ask your health care provider.  Keep all follow-up visits as told by your health care provider. This is important. Contact a health care provider if:  Your back pain does not improve after 6 weeks of treatment.  Your symptoms get worse. Get help right away if:  Your back pain is severe.  You are unable to stand or walk.  You develop pain in your legs.  You develop weakness in your buttocks or legs.  You have difficulty controlling when you urinate or when you have a bowel movement. ? You have frequent, painful, or bloody urination. ? You have a temperature over 101.48F (38.3C). Summary  A lumbar sprain, which is sometimes called a low-back sprain, is a stretch or tear in the bands of tissue that hold bones and joints together (ligaments) in the lower back (lumbar spine).  Rest and return to your normal activities as told by your health care provider. Ask your health care provider what activities are safe for you.  If directed, apply heat and ice to the affected area as often as told by your health care provider.  Take over-the-counter and prescription medicines only as told by your health care provider.  Contact a health care provider if you have new or worsening symptoms. This information is not intended to replace advice given to you by your health care provider. Make sure you discuss any questions you have with your health care provider. Document Revised: 10/20/2017 Document Reviewed: 10/20/2017 Elsevier Patient Education  [place].

## 2019-11-15 NOTE — Progress Notes (Signed)
Chief Complaint  Patient presents with  . Back Pain    to ER over the weekend has brace, too large for her.    85 year old female with history of compression fracture presents to the ER with acute lower back pain after reaching up to put a blanket over her chair.  She went to the ER and they evaluated her with a CAT scan which showed she had a small renal calculus although its not really the area where she is hurting  She complains of severe lower back pain in the lower thoracolumbar fascia  No leg pain no numbness no tingling urinary symptoms negative for dysuria hematuria  Past Medical History:  Diagnosis Date  . Anxiety   . Chronic diastolic (congestive) heart failure ([place])   . Chronic obstructive pulmonary disease, unspecified ([place])   . COPD (chronic obstructive pulmonary disease) ([place])   . GERD (gastroesophageal reflux disease)   . Hyperlipidemia   . IBS (irritable bowel syndrome)   . Lumbago with sciatica   . Osteoporosis   . Overactive bladder   . Pneumonia    BP 132/90   Pulse 80   Ht 4\' 6"  (1.372 m)   Wt 104 lb (47.2 kg)   BMI 25.08 kg/m   Severe kyphosis no tenderness in the thoracic spine tenderness is noted in the right lower thoracolumbar fascia With normal neurovascular exam distally Normal motor exam distally  CT scan did show a new compression fracture  Report IMPRESSION: Tiny nonobstructing LEFT renal calculus. Sigmoid diverticulosis without evidence of diverticulitis. Moderate-sized hiatal hernia. Chronic interstitial lung disease changes at lung bases, increased. Compression fractures of L1 and L2 vertebral bodies, new since prior exam, likely acute to subacute. No acute intra-abdominal or intrapelvic abnormalities. Aortic Atherosclerosis (ICD10-I70.0) and Emphysema (ICD10-J43.9). Electronically Signed   By: Lavonia Dana M.D.   On: 11/10/2019 12:17    Encounter Diagnosis  Name Primary?  . Lumbar sprain, initial encounter Yes   Meds ordered this  encounter  Medications  . HYDROcodone-acetaminophen (NORCO/VICODIN) 5-325 MG tablet    Sig: Take 1 tablet by mouth every 6 (six) hours as needed for moderate pain.    Dispense:  30 tablet    Refill:  0  . tiZANidine (ZANAFLEX) 4 MG tablet    Sig: Take 1 tablet (4 mg total) by mouth daily.    Dispense:  30 tablet    Refill:  1    Call us back in 2 weeks if no improvement

## 2019-11-26 DIAGNOSIS — Z79899 Other long term (current) drug therapy: Secondary | ICD-10-CM | POA: Diagnosis not present

## 2019-11-26 DIAGNOSIS — I5032 Chronic diastolic (congestive) heart failure: Secondary | ICD-10-CM | POA: Diagnosis not present

## 2019-11-26 DIAGNOSIS — R7303 Prediabetes: Secondary | ICD-10-CM | POA: Diagnosis not present

## 2019-11-26 DIAGNOSIS — F0390 Unspecified dementia without behavioral disturbance: Secondary | ICD-10-CM | POA: Diagnosis not present

## 2019-11-26 DIAGNOSIS — E785 Hyperlipidemia, unspecified: Secondary | ICD-10-CM | POA: Diagnosis not present

## 2019-11-26 DIAGNOSIS — Z0001 Encounter for general adult medical examination with abnormal findings: Secondary | ICD-10-CM | POA: Diagnosis not present

## 2019-12-05 DIAGNOSIS — M546 Pain in thoracic spine: Secondary | ICD-10-CM | POA: Diagnosis not present

## 2019-12-05 DIAGNOSIS — G4700 Insomnia, unspecified: Secondary | ICD-10-CM | POA: Diagnosis not present

## 2019-12-05 DIAGNOSIS — F419 Anxiety disorder, unspecified: Secondary | ICD-10-CM | POA: Diagnosis not present

## 2019-12-05 DIAGNOSIS — M130 Polyarthritis, unspecified: Secondary | ICD-10-CM | POA: Diagnosis not present

## 2019-12-05 DIAGNOSIS — F0151 Vascular dementia with behavioral disturbance: Secondary | ICD-10-CM | POA: Diagnosis not present

## 2019-12-05 DIAGNOSIS — Z79891 Long term (current) use of opiate analgesic: Secondary | ICD-10-CM | POA: Diagnosis not present

## 2019-12-05 DIAGNOSIS — M5451 Vertebrogenic low back pain: Secondary | ICD-10-CM | POA: Diagnosis not present

## 2019-12-05 DIAGNOSIS — E785 Hyperlipidemia, unspecified: Secondary | ICD-10-CM | POA: Diagnosis not present

## 2019-12-05 DIAGNOSIS — M810 Age-related osteoporosis without current pathological fracture: Secondary | ICD-10-CM | POA: Diagnosis not present

## 2019-12-05 DIAGNOSIS — G309 Alzheimer's disease, unspecified: Secondary | ICD-10-CM | POA: Diagnosis not present

## 2019-12-05 DIAGNOSIS — I5032 Chronic diastolic (congestive) heart failure: Secondary | ICD-10-CM | POA: Diagnosis not present

## 2019-12-05 DIAGNOSIS — R7303 Prediabetes: Secondary | ICD-10-CM | POA: Diagnosis not present

## 2019-12-10 ENCOUNTER — Ambulatory Visit: Payer: Medicare Other | Admitting: Orthopedic Surgery

## 2019-12-11 ENCOUNTER — Other Ambulatory Visit: Payer: Self-pay | Admitting: Orthopedic Surgery

## 2019-12-11 DIAGNOSIS — S335XXA Sprain of ligaments of lumbar spine, initial encounter: Secondary | ICD-10-CM

## 2020-01-07 ENCOUNTER — Telehealth: Payer: Self-pay

## 2020-01-07 NOTE — Telephone Encounter (Signed)
New message     [facility] is closed this week due to them being on vacation , [name] called because of [name] be congested , runny nose, cough.  She was tested yesterday for Covid and it came back negative, is there something you can call in for her, she has been taking OTC medication and its not helping
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
## 2020-01-08 NOTE — Telephone Encounter (Signed)
Left message to return call. We have not seen patient in the office in over 2 years.I would suggest to her to go to Urgent Care to be evaluated.

## 2020-01-25 ENCOUNTER — Telehealth (INDEPENDENT_AMBULATORY_CARE_PROVIDER_SITE_OTHER): Payer: Medicare Other | Admitting: Student

## 2020-01-25 ENCOUNTER — Other Ambulatory Visit: Payer: Self-pay

## 2020-01-25 ENCOUNTER — Encounter: Payer: Self-pay | Admitting: Student

## 2020-01-25 VITALS — HR 84 | Ht 68.0 in | Wt 102.0 lb

## 2020-01-25 DIAGNOSIS — J449 Chronic obstructive pulmonary disease, unspecified: Secondary | ICD-10-CM

## 2020-01-25 DIAGNOSIS — I5032 Chronic diastolic (congestive) heart failure: Secondary | ICD-10-CM

## 2020-01-25 NOTE — Progress Notes (Signed)
Virtual Visit via Telephone Note   This visit type was conducted due to national recommendations for restrictions regarding the COVID-19 Pandemic (e.g. social distancing) in an effort to limit this patient's exposure and mitigate transmission in our community.  Due to her co-morbid illnesses, this patient is at least at moderate risk for complications without adequate follow up.  This format is felt to be most appropriate for this patient at this time.  The patient did not have access to video technology/had technical difficulties with video requiring transitioning to audio format only (telephone).  All issues noted in this document were discussed and addressed.  No physical exam could be performed with this format.  Please refer to the patient's chart for her  consent to telehealth for [facility].   Date:  01/25/2020   ID:  [name], DOB 1935-01-02, MRN [id number] The patient was identified using 2 identifiers.  Patient Location: Home Provider Location: Office/Clinic  PCP:  [facility]  Cardiologist:  Rozann Lesches, MD  Electrophysiologist:  None   Evaluation Performed:  Follow-Up Visit  Chief Complaint: Routine Visit  History of Present Illness:    [name] is a 86 y.o. female with past medical history of chronic diastolic CHF, COPD, and GERD who presents for a 3-month follow-up telehealth visit.  She most recently had a telehealth visit with Dr. Domenic Polite in 07/2019 and her daughter reported her weight and edema had been relatively stable on Lasix 40 mg daily. She denied any specific chest pain or palpitations. She was continued on her current medication regimen at that time.  In talking with the patient today, she reports doing well since her last visit. Breathing has been stable and she does use her breathing treatments on a routine basis. Says her oxygen saturations remain stable at 95-97%. She denies any recent orthopnea or PND. Does experience  occasional swelling along the top of her right foot but no pitting edema. She does weigh herself regularly and this has been stable. No recent chest pain or palpitations.   The patient does not have symptoms concerning for COVID-19 infection (fever, chills, cough, or new shortness of breath).    Past Medical History:  Diagnosis Date  . Anxiety   . Chronic diastolic (congestive) heart failure ([place])   . Chronic obstructive pulmonary disease, unspecified ([place])   . COPD (chronic obstructive pulmonary disease) ([place])   . GERD (gastroesophageal reflux disease)   . Hyperlipidemia   . IBS (irritable bowel syndrome)   . Lumbago with sciatica   . Osteoporosis   . Overactive bladder   . Pneumonia    Past Surgical History:  Procedure Laterality Date  . COLONOSCOPY  10/2009   sigmoid diverticula, multiple tubulovillous adneomas, needs surveillance Oct 2013  . ESOPHAGOGASTRODUODENOSCOPY  04/16/2011   Dr. Gala Romney: Noncritical Schatzkis ring( not manipulated because no dysphagia). Normal esophagus otherwise  large hiatal hernia. Gastric polyp-status post biopsy. Gastric erosions-staus post biopsy. Abnormal bulb-status post biopsy. benign small bowel, stomach biopsy with ulcerated gastric antral mucosa with foveolar hyperplasia and surface erosion, polyp with inflamed gastric antral mucosa   . ESOPHAGOGASTRODUODENOSCOPY N/A 04/29/2014   Dr. Gala Romney: Noncritical Schatzki's ring large hiatal hernia. Retained gastric contents.GES with slight delay  . HIP PINNING,CANNULATED Right 04/06/2018   Procedure: CANNULATED HIP PINNING;  Surgeon: Carole Civil, MD;  Location: [facility];  Service: Orthopedics;  Laterality: Right;     Current Meds  Medication Sig  . acetaminophen (TYLENOL) 500 MG tablet Take  500 mg by mouth daily.  Marland Kitchen albuterol (PROAIR HFA) 108 (90 Base) MCG/ACT inhaler Inhale 2 puffs into the lungs every 4 (four) hours as needed.  Marland Kitchen albuterol (PROVENTIL) (2.5 MG/3ML) 0.083% nebulizer solution Take 3 mLs  (2.5 mg total) by nebulization every 6 (six) hours as needed for wheezing or shortness of breath.  . ALPRAZolam (XANAX) 0.5 MG tablet Take 0.5 mg by mouth 2 (two) times daily.  . cetirizine (ZYRTEC) 10 MG tablet Take 10 mg by mouth daily as needed for allergies.  Marland Kitchen diclofenac sodium (VOLTAREN) 1 % GEL Apply 2 g topically 2 (two) times daily.  . diclofenac Sodium (VOLTAREN) 1 % GEL APPLY AS DIRECTED TO THE AFFECTED KNEE FOUR TIMES DAILY.  Marland Kitchen donepezil (ARICEPT) 10 MG tablet Take 10 mg by mouth daily.  . furosemide (LASIX) 40 MG tablet Take 40 mg by mouth daily.  Marland Kitchen HYDROcodone-acetaminophen (NORCO/VICODIN) 5-325 MG tablet Take 1 tablet by mouth every 6 (six) hours as needed for moderate pain.  Marland Kitchen lidocaine (XYLOCAINE) 5 % ointment Apply 1 application topically 4 (four) times daily as needed.  . linaclotide (LINZESS) 145 MCG CAPS capsule Take 1 capsule (145 mcg total) by mouth daily as needed. (Patient taking differently: Take 145 mcg by mouth daily as needed (for constipation).)  . meloxicam (MOBIC) 7.5 MG tablet TAKE 1 TABLET BY MOUTH TWICE DAILY  . Multiple Vitamin (MULTIVITAMIN WITH MINERALS) TABS tablet Take 1 tablet by mouth daily.  . pantoprazole (PROTONIX) 40 MG tablet TAKE (1) TABLET BY MOUTH TWICE A DAY WITH MEALS (BREAKFAST AND SUPPER) (Patient taking differently: Take 40 mg by mouth 2 (two) times daily before a meal.)  . potassium chloride (K-DUR) 10 MEQ tablet Take 1 tablet (10 mEq total) by mouth daily.  . tamsulosin (FLOMAX) 0.4 MG CAPS capsule Take 1 capsule (0.4 mg total) by mouth daily.  Marland Kitchen tiZANidine (ZANAFLEX) 4 MG tablet TAKE 1 TABLET BY MOUTH ONCE DAILY.  . traMADol (ULTRAM) 50 MG tablet Take 1 tablet (50 mg total) by mouth every 6 (six) hours as needed (pain). (Patient taking differently: Take 50 mg by mouth 2 (two) times daily.)  . umeclidinium-vilanterol (ANORO ELLIPTA) 62.5-25 MCG/INH AEPB Inhale 1 puff into the lungs daily.  Marland Kitchen zolpidem (AMBIEN) 5 MG tablet Take 1 tablet (5 mg  total) by mouth at bedtime as needed for up to 13 days for sleep. X 14 days (Patient taking differently: Take 5 mg by mouth at bedtime.)     Allergies:   Codeine, Codeine, Levofloxacin, Sulfonamide derivatives, and Sulfa antibiotics   Social History   Tobacco Use  . Smoking status: Passive Smoke Exposure - Never Smoker  . Smokeless tobacco: Current User    Types: Snuff  Vaping Use  . Vaping Use: Never used  Substance Use Topics  . Alcohol use: No  . Drug use: No     Family Hx: The patient's family history includes Diabetes in her mother; Heart disease in her father; Stroke in her mother. There is no history of Colon cancer.  ROS:   Please see the history of present illness.     All other systems reviewed and are negative.   Prior CV studies:   The following studies were reviewed today:  Echocardiogram: 09/2018 IMPRESSIONS    1. Left ventricular ejection fraction, by visual estimation, is 60 to  65%. The left ventricle has normal function. Normal left ventricular size.  Normal left ventricular posterior wall thickness. There is no left  ventricular hypertrophy.  2.  Left ventricular diastolic Doppler parameters are indeterminate  pattern of LV diastolic filling.  3. There is mild basal septal LV hypertrophy.  4. Global right ventricle has normal systolic function.The right  ventricular size is normal. No increase in right ventricular wall  thickness.  5. Left atrial size was moderately dilated.  6. Right atrial size was mildly dilated.  7. The mitral valve is normal in structure. Mild mitral valve  regurgitation. No evidence of mitral stenosis.  8. The tricuspid valve is normal in structure. Tricuspid valve  regurgitation moderate.  9. The aortic valve is tricuspid Aortic valve regurgitation was not  visualized by color flow Doppler. Mild aortic valve sclerosis without  stenosis.  10. The pulmonic valve was not well visualized. Pulmonic valve   regurgitation is not visualized by color flow Doppler.  11. Moderately elevated pulmonary artery systolic pressure.  12. Pulmonary HTN is moderate, PASP is 43 mmHg.  13. The inferior vena cava is normal in size with greater than 50%  respiratory variability, suggesting right atrial pressure of 3 mmHg.   Labs/Other Tests and Data Reviewed:    EKG:  No ECG reviewed.  Recent Labs: 07/06/2019: ALT 15; BUN 23; Creatinine, Ser 0.95; Hemoglobin 9.4; Platelets 369; Potassium 4.5; Sodium 137   Recent Lipid Panel Lab Results  Component Value Date/Time   CHOL 261 (A) 05/16/2017 12:00 AM   TRIG 101 05/16/2017 12:00 AM   HDL 90 (A) 05/16/2017 12:00 AM   LDLCALC 150 05/16/2017 12:00 AM    Wt Readings from Last 3 Encounters:  01/25/20 102 lb (46.3 kg)  11/15/19 104 lb (47.2 kg)  07/30/19 104 lb (47.2 kg)     Objective:    Vital Signs:  Pulse 84   Ht 5\' 8"  (1.727 m)   Wt 102 lb (46.3 kg)   SpO2 96%   BMI 15.51 kg/m    General: Pleasant female sounding in NAD Psych: Normal affect. Neuro: Alert and oriented X 3.  Lungs:  Resp regular and unlabored while talking on the phone.   ASSESSMENT & PLAN:    1. Chronic Diastolic CHF - She does have baseline dyspnea on exertion in the setting of COPD but denies any acute changes in her breathing. No recent orthopnea, PND or pitting edema. Continue Lasix 40mg  daily. Will request most recent labs from her PCP.    2. COPD - She reports her breathing has been stable with oxygen saturations at 95-97% when checked at home. She continues to use her nebulizer on a regular basis and her rescue inhaler as needed.    COVID-19 Education: The signs and symptoms of COVID-19 were discussed with the patient and how to seek care for testing (follow up with PCP or arrange E-visit). The importance of social distancing was discussed today.  Time:   Today, I have spent 9 minutes with the patient with telehealth technology discussing the above problems.      Medication Adjustments/Labs and Tests Ordered: Current medicines are reviewed at length with the patient today.  Concerns regarding medicines are outlined above.   Tests Ordered: No orders of the defined types were placed in this encounter.   Medication Changes: No orders of the defined types were placed in this encounter.   Follow Up:  In Person or Virtual in 6 month(s)  Signed, Erma Heritage, PA-C  01/25/2020 2:24 PM    [place]

## 2020-01-25 NOTE — Patient Instructions (Signed)
Medication Instructions:  Your physician recommends that you continue on your current medications as directed. Please refer to the Current Medication list given to you today.  *If you need a refill on your cardiac medications before your next appointment, please call your pharmacy*   Lab Work: Labs requested from PCP If you have labs (blood work) drawn today and your tests are completely normal, you will receive your results only by:  [place] (if you have MyChart) OR  A paper copy in the mail If you have any lab test that is abnormal or we need to change your treatment, we will call you to review the results.   Testing/Procedures: None Today   Follow-Up: At [facility], you and your health needs are our priority.  As part of our continuing mission to provide you with exceptional heart care, we have created designated Provider Care Teams.  These Care Teams include your primary Cardiologist (physician) and Advanced Practice Providers (APPs -  Physician Assistants and Nurse Practitioners) who all work together to provide you with the care you need, when you need it.  We recommend signing up for the patient portal called "MyChart".  Sign up information is provided on this After Visit Summary.  MyChart is used to connect with patients for Virtual Visits (Telemedicine).  Patients are able to view lab/test results, encounter notes, upcoming appointments, etc.  Non-urgent messages can be sent to your provider as well.   To learn more about what you can do with MyChart, go to [email or web address].    Your next appointment:   6 month(s)  The format for your next appointment:   In Person  Provider:   You may see Rozann Lesches, MD or one of the following Advanced Practice Providers on your designated Care Team:    Bernerd Pho, PA-C     Other Instructions None Today

## 2020-01-26 ENCOUNTER — Encounter: Payer: Self-pay | Admitting: Student

## 2020-01-28 ENCOUNTER — Encounter: Payer: Self-pay | Admitting: Student

## 2020-01-28 ENCOUNTER — Telehealth: Payer: Self-pay | Admitting: Student

## 2020-01-28 NOTE — Telephone Encounter (Signed)
Error

## 2020-01-30 ENCOUNTER — Ambulatory Visit: Payer: Medicare Other | Admitting: Cardiology

## 2020-02-10 IMAGING — CR PORTABLE CHEST - 1 VIEW
1 series · 2 of 2 positions shown · non-contrast
Comparison: 06/20/2018

CLINICAL DATA: Chest pain and shortness of breath.

EXAM:
PORTABLE CHEST 1 VIEW

[Series 1: portable · 0.17mm/px · 2 of 2 slices shown]
[im 1/2]
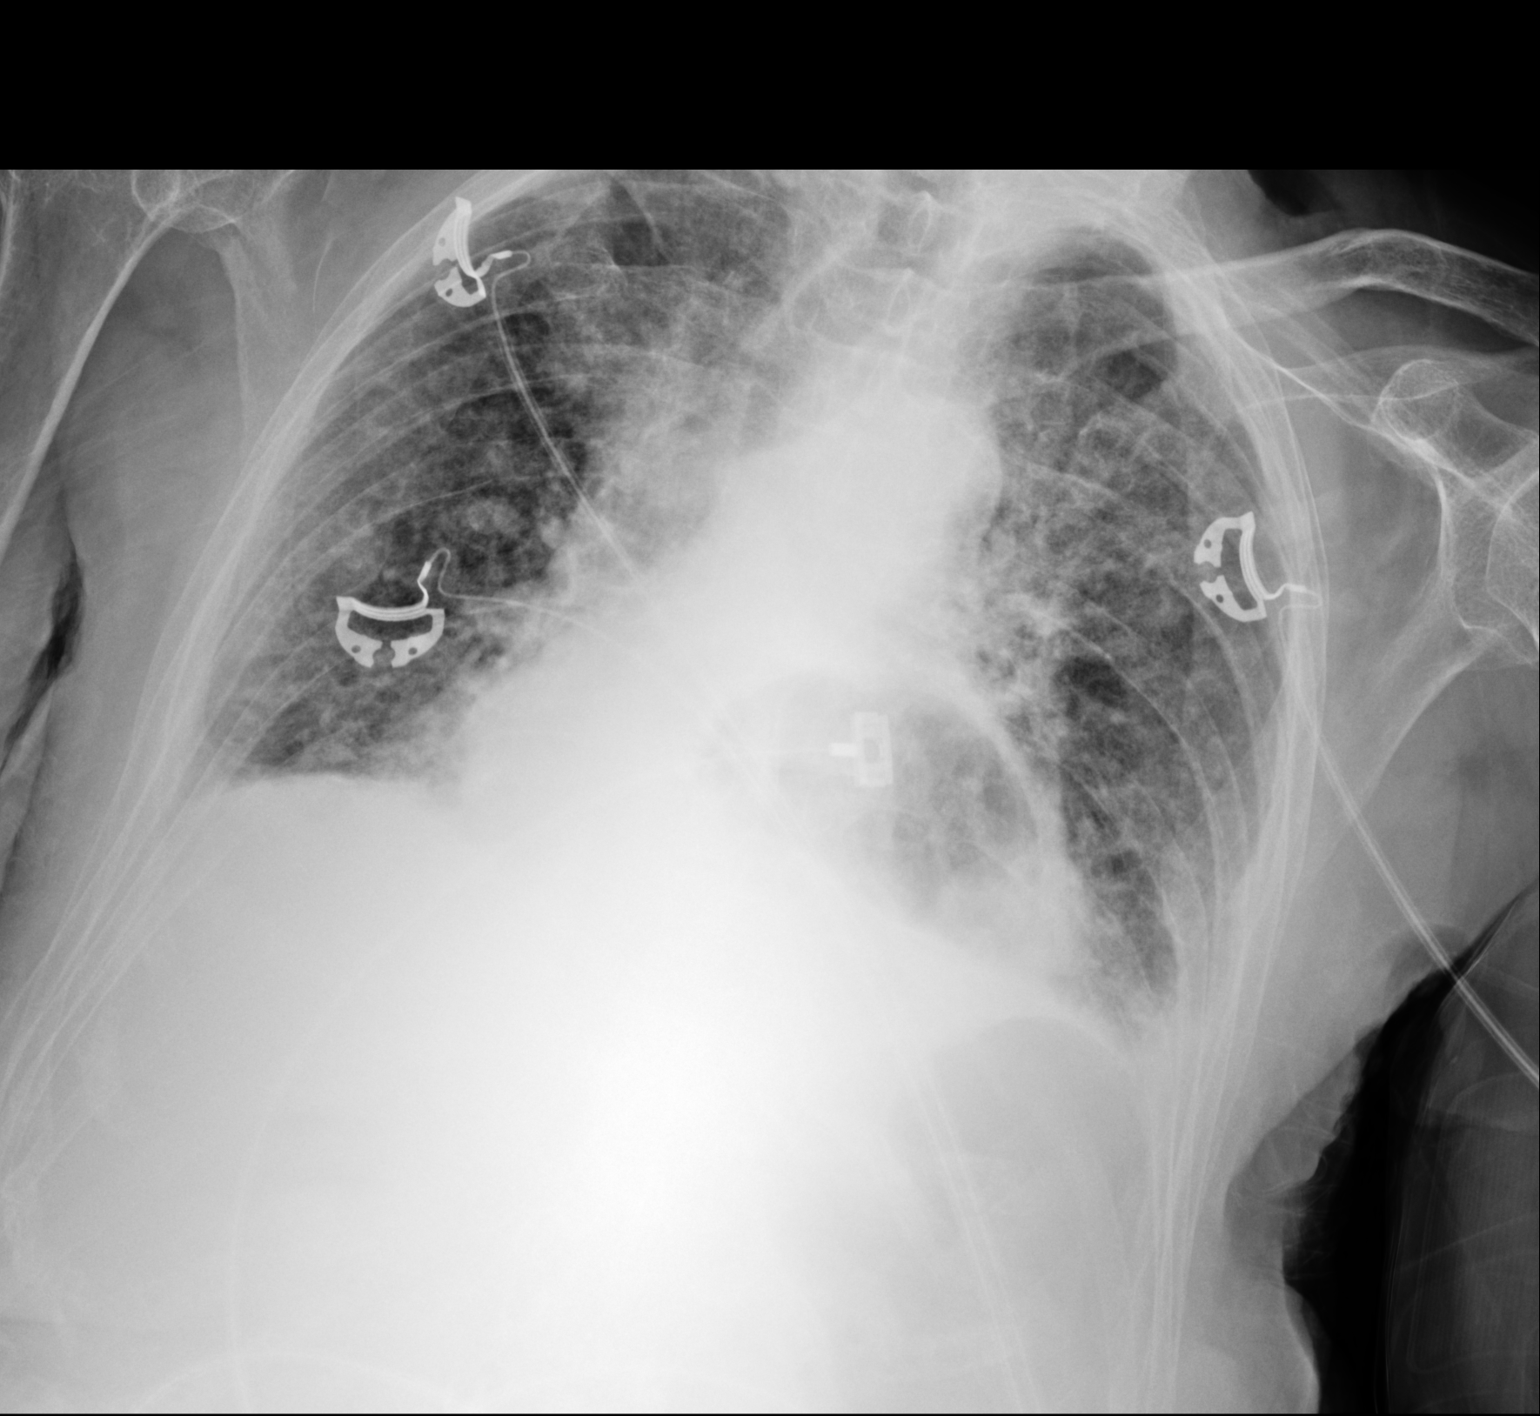
[im 2/2]
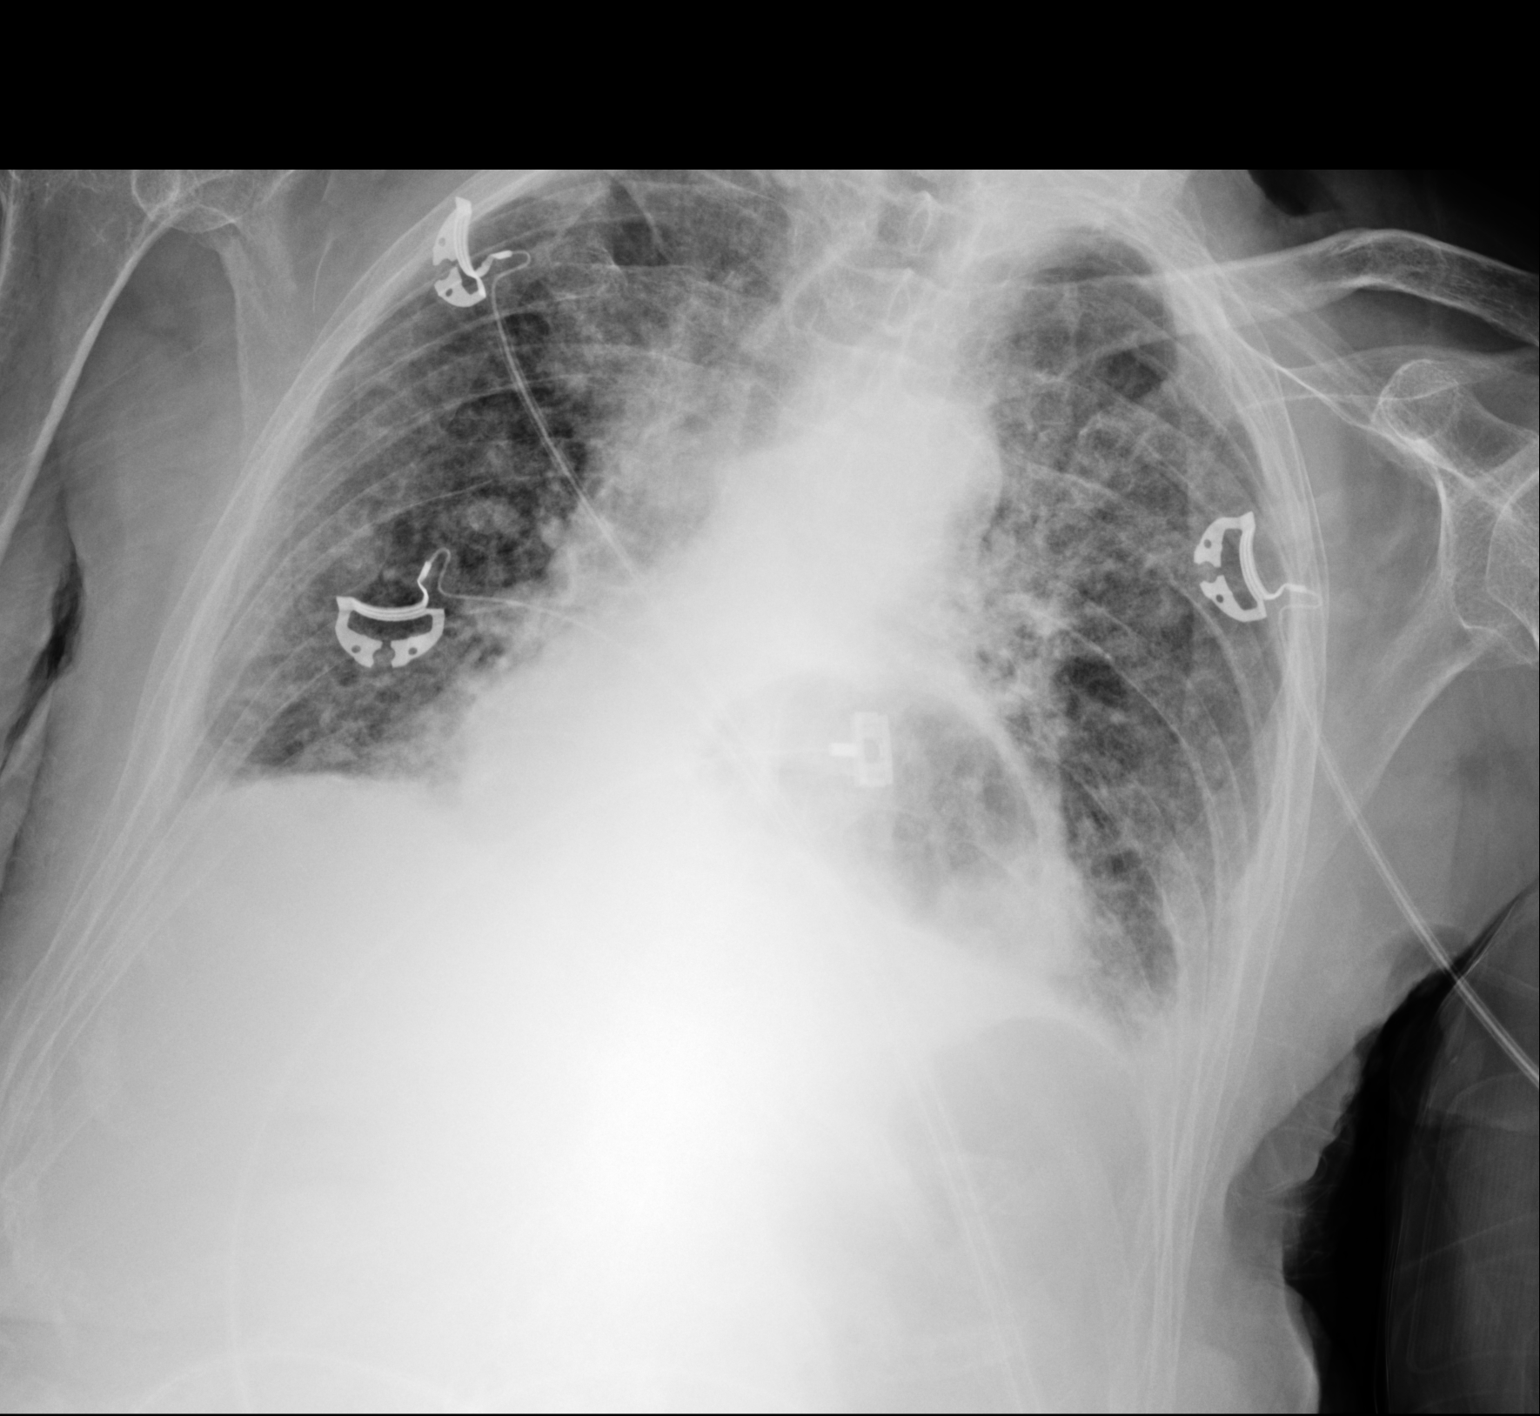

[2 of 2 positions shown; findings below may reference images not displayed]

FINDINGS: The heart is mildly enlarged but stable.

Fairly significant central vascular congestion and interstitial
pulmonary edema with bilateral pleural effusions and bibasilar
atelectasis.

Large hiatal hernia.
IMPRESSION: Findings consistent with CHF.

## 2020-02-10 IMAGING — CT CT ANGIOGRAPHY CHEST
2 of 6 series · 18 of 46 positions shown · IV contrast (Isovue)
Comparison: Radiograph 06/29/2018, CT 02/17/2018

CLINICAL DATA: Shortness of breath and chest pain

EXAM:
CT ANGIOGRAPHY CHEST WITH CONTRAST
TECHNIQUE: Multidetector CT imaging of the chest was performed using the
standard protocol during bolus administration of intravenous
contrast. Multiplanar CT image reconstructions and MIPs were
obtained to evaluate the vascular anatomy.
CONTRAST:  100mL OMNIPAQUE IOHEXOL 350 MG/ML SOLN

[Series 6: thins · axial · 0.58mm/px · z∈[+1142,+1372]mm · 15 of 252 slices shown]
[im 11/252  lung]
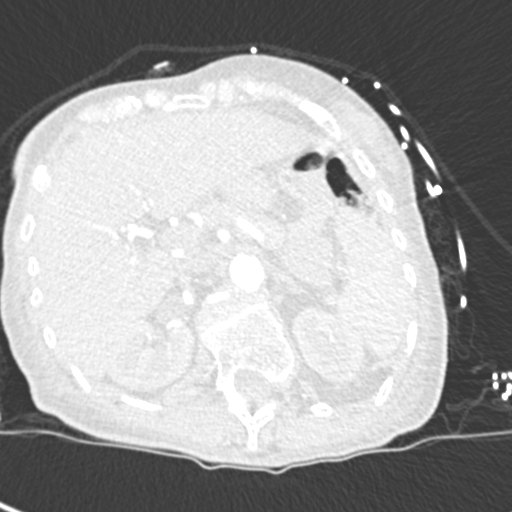
[im 33/252  soft-tissue]
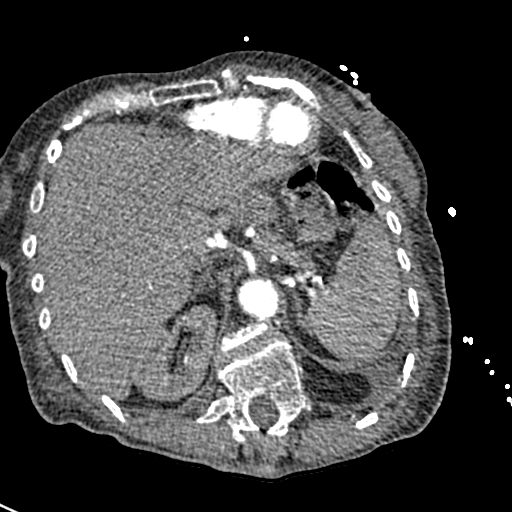
[im 44/252  lung]
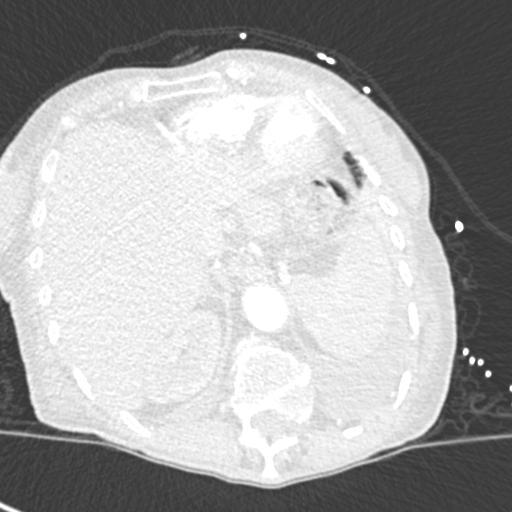
[im 66/252  soft-tissue]
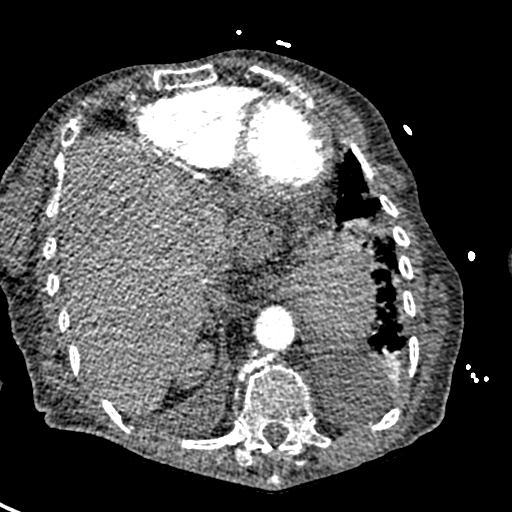
[im 77/252  lung]
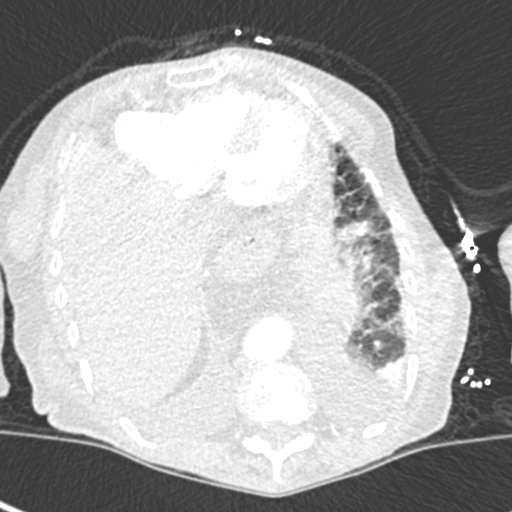
[im 99/252  soft-tissue]
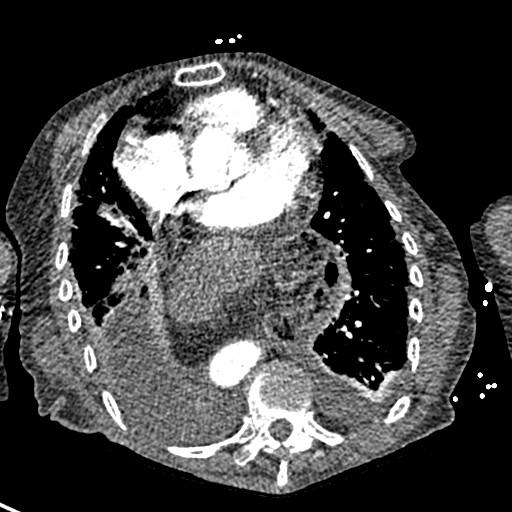
[im 110/252  lung]
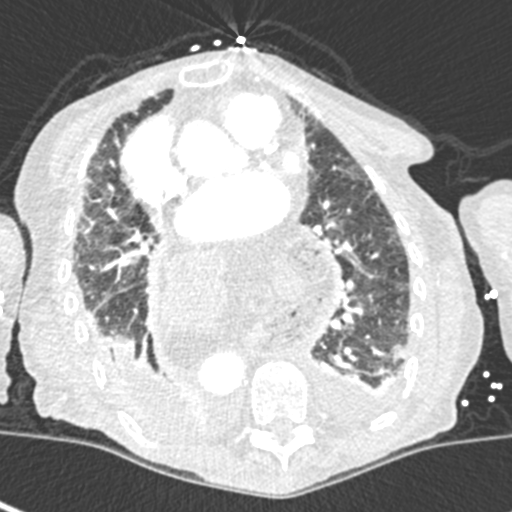
[im 131/252  soft-tissue]
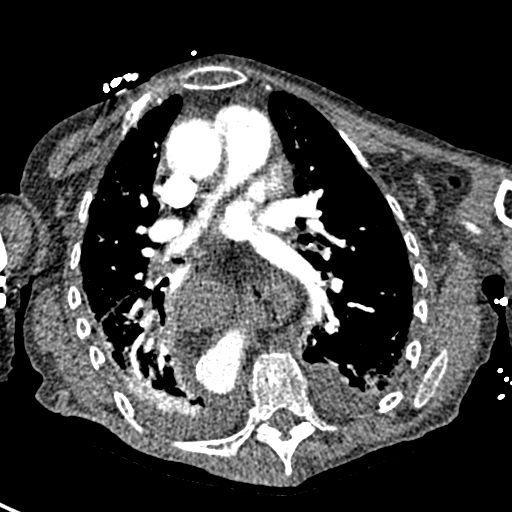
[im 142/252  lung]
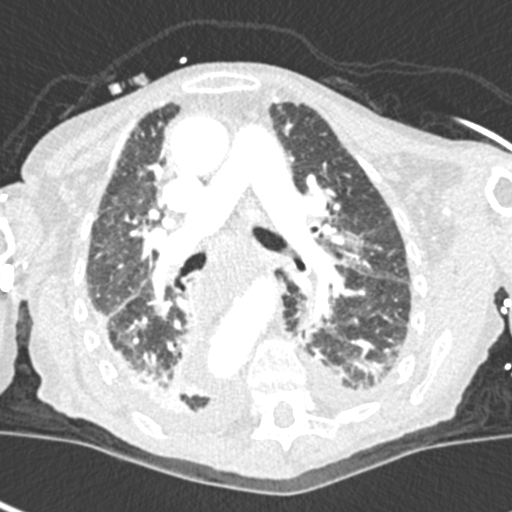
[im 153/252  soft-tissue]
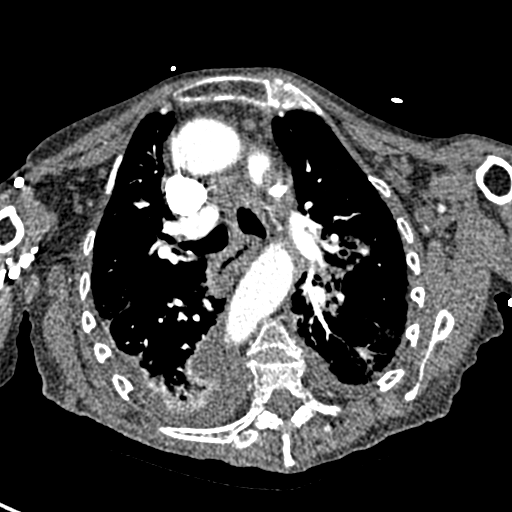
[im 175/252  lung]
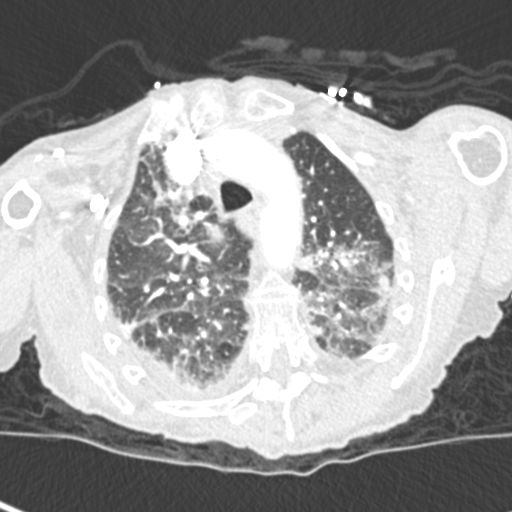
[im 186/252  soft-tissue]
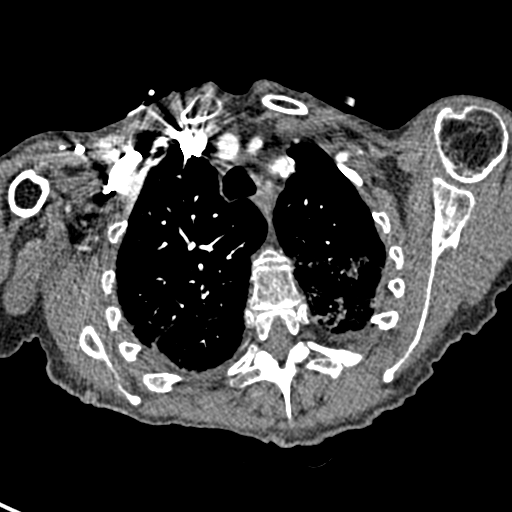
[im 208/252  lung]
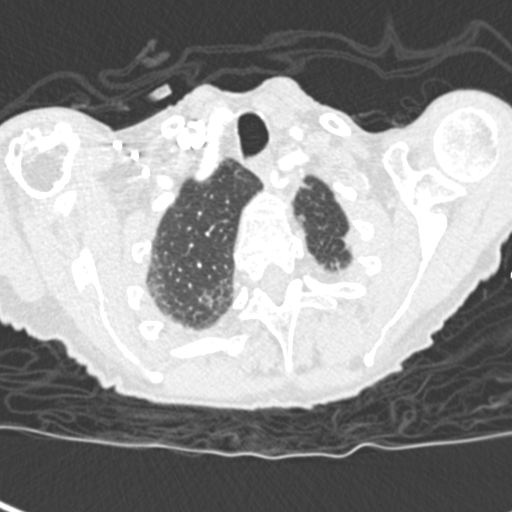
[im 219/252  soft-tissue]
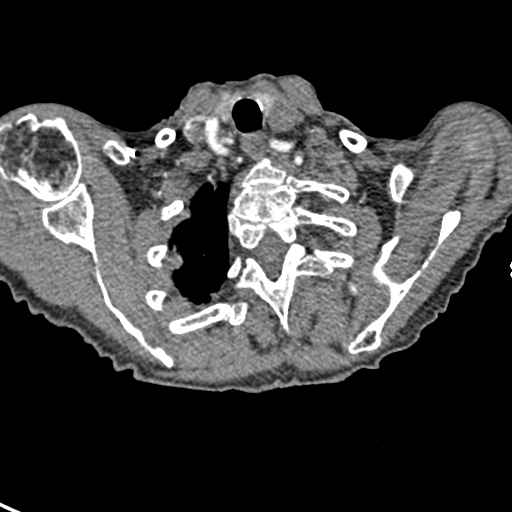
[im 241/252  lung]
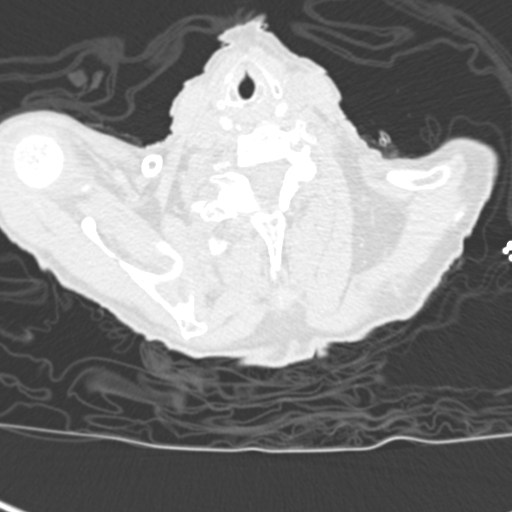

[Series 8: coronal mpr · coronal · 0.59mm/px · 3 of 151 slices shown]
[im 38/151  soft-tissue]
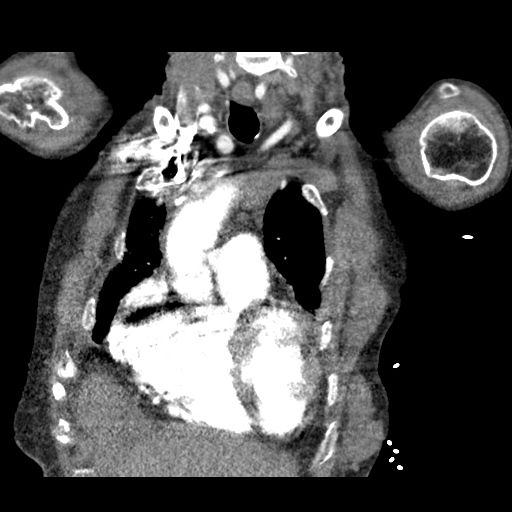
[im 76/151  soft-tissue]
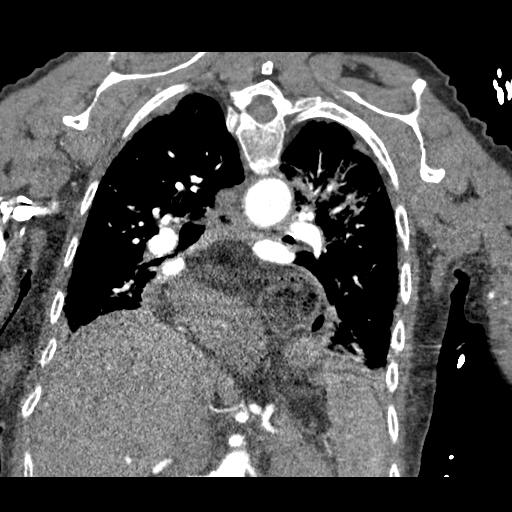
[im 113/151  soft-tissue]
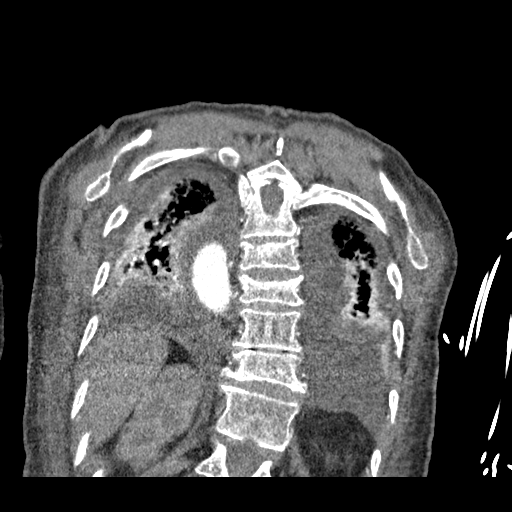

[18 of 46 positions shown; findings below may reference images not displayed]

FINDINGS: Cardiovascular: Satisfactory opacification of the pulmonary arteries
to the segmental level. No evidence of pulmonary embolism.
Nonaneurysmal aorta. No dissection is seen. Mild to moderate aortic
atherosclerosis. Tortuous aorta. Coronary vascular calcifications.
Heart size upper normal. No pericardial effusion.

Mediastinum/Nodes: Midline trachea. No suspicious thyroid mass. No
significant adenopathy. Thickened appearance of the distal
esophagus. Large hiatal hernia containing most of the stomach,
mesenteric fat, and some of the colon. This is increased compared to
prior CT.

Lungs/Pleura: Small bilateral pleural effusions which are new.
Partial atelectasis within the lower lobes. Mild subpleural fibrosis
within the right middle lobe lingula and lower lobes. Peribronchial
ground-glass density in the left upper lobe and superior segment of
left lower lobe with additional scattered foci of ground-glass
density in the right upper lobe and superior segment right lower
lobe. Increased septal thickening, suggesting mild underlying edema.

Upper Abdomen: No acute abnormality.

Musculoskeletal: Kyphosis of the spine. Multiple chronic compression
fractures T6 through T9 and at T11 with increased loss of height of
T11 vertebral body since 02/17/2018 CT.

Review of the MIP images confirms the above findings.
IMPRESSION: 1. Negative for acute pulmonary embolus or aortic dissection.
2. Small bilateral pleural effusions with partial atelectasis at the
lower lobes. Multifocal ground-glass densities within the left
perihilar region, upper lobes and superior segments of the lower
lobes, possible multifocal pneumonia. Diffuse bilateral septal
thickening, suggesting underlying interstitial edema.
3. Large hiatal hernia containing stomach, mesentery and colon. This
appears slightly increased compared to prior CT

Aortic aneurysm NOS ([id number]).

## 2020-05-31 IMAGING — CR DG CHEST 1V PORT
1 series · 1 of 1 positions shown · non-contrast
Comparison: 09/29/2018 chest radiograph.

CLINICAL DATA: Dyspnea

EXAM:
PORTABLE CHEST 1 VIEW

[portable]
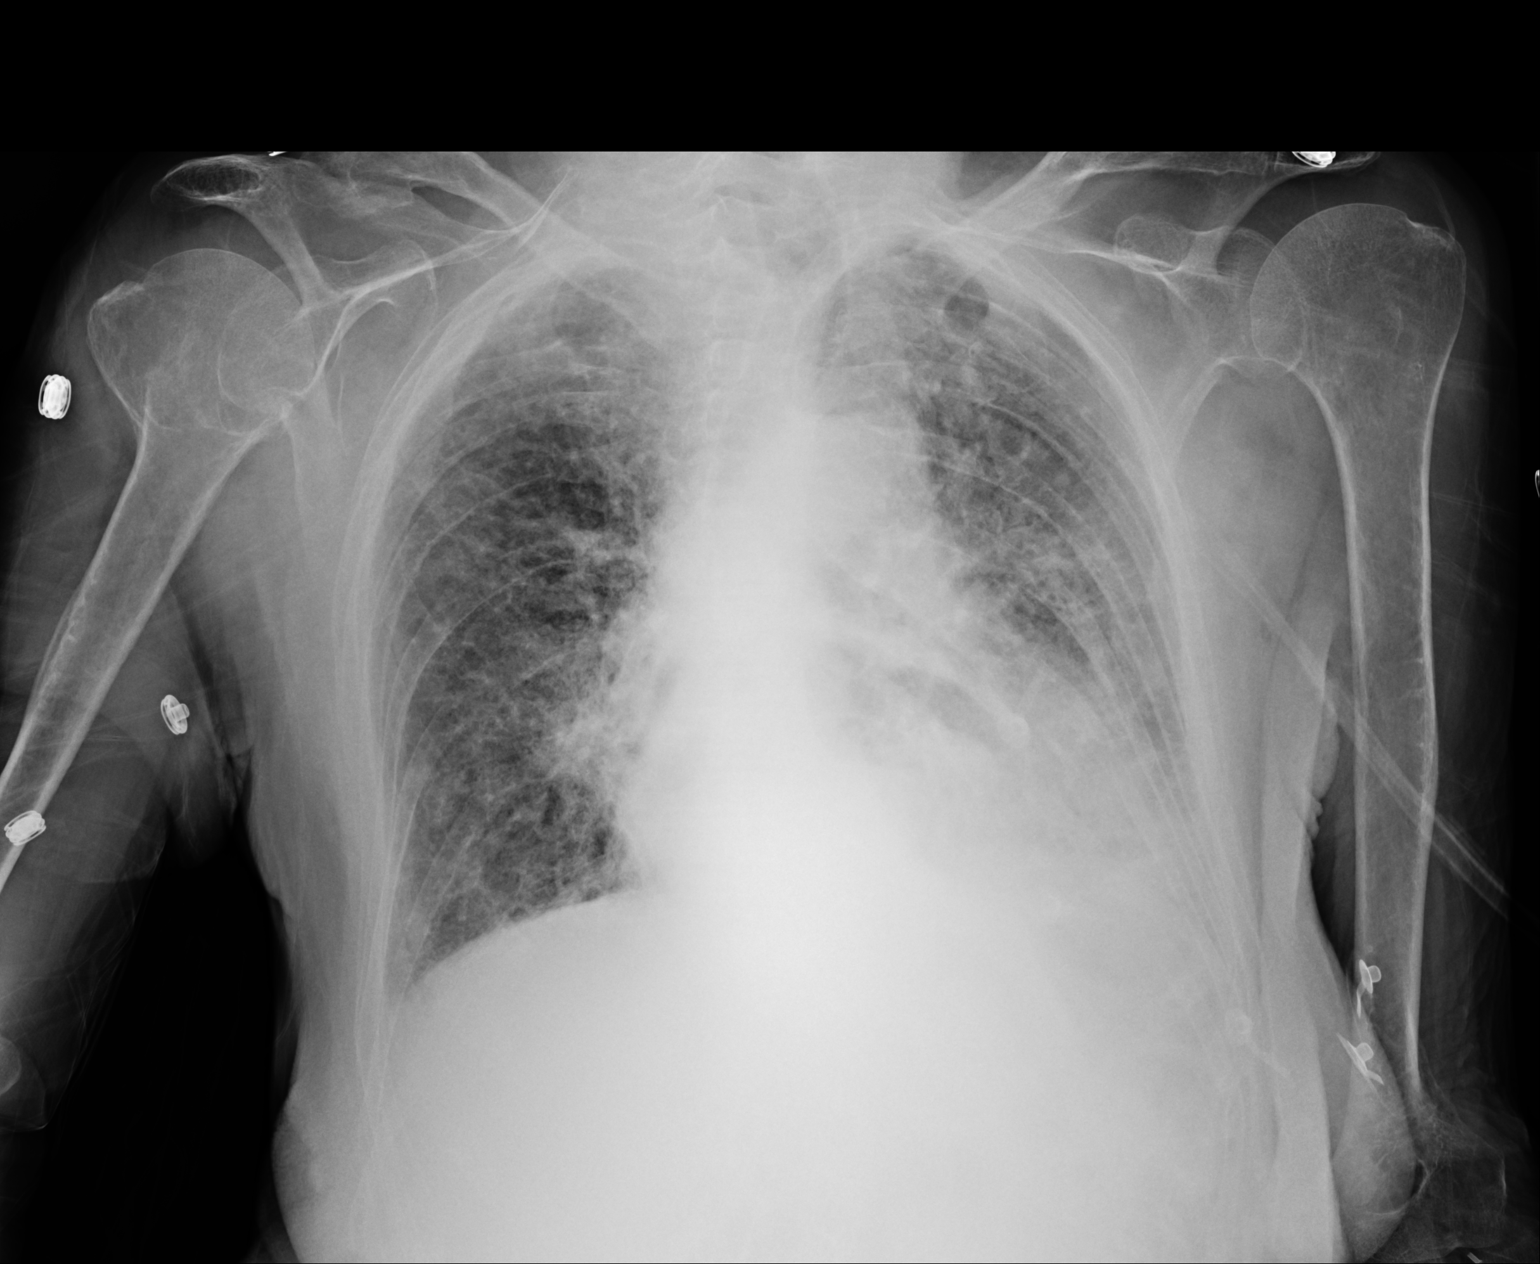

[1 of 1 positions shown; findings below may reference images not displayed]

FINDINGS: Stable cardiomediastinal silhouette with mild cardiomegaly and large
hiatal hernia. No pneumothorax. No pleural effusion. Diffuse
parahilar hazy and linear opacities.
IMPRESSION: 1. Stable mild cardiomegaly. Diffuse parahilar hazy and linear
opacities, favor pulmonary edema due to congestive heart failure.
2. Large hiatal hernia.

## 2020-08-02 ENCOUNTER — Other Ambulatory Visit: Payer: Self-pay | Admitting: Orthopedic Surgery

## 2020-08-04 ENCOUNTER — Ambulatory Visit: Payer: Medicare Other | Admitting: Cardiology

## 2020-08-19 ENCOUNTER — Encounter: Payer: Self-pay | Admitting: Cardiology

## 2020-08-19 ENCOUNTER — Ambulatory Visit (INDEPENDENT_AMBULATORY_CARE_PROVIDER_SITE_OTHER): Payer: Medicare Other | Admitting: Cardiology

## 2020-08-19 ENCOUNTER — Other Ambulatory Visit: Payer: Self-pay

## 2020-08-19 VITALS — BP 112/64 | HR 87 | Ht 61.0 in | Wt 107.0 lb

## 2020-08-19 DIAGNOSIS — I5032 Chronic diastolic (congestive) heart failure: Secondary | ICD-10-CM

## 2020-08-19 NOTE — Progress Notes (Signed)
Cardiology Office Note  Date: 08/19/2020   ID: [name], DOB 08-05-1934, MRN [id number]  PCP:  [facility]  Cardiologist:  Rozann Lesches, MD Electrophysiologist:  None   Chief Complaint  Patient presents with   Cardiac follow-up    History of Present Illness: [name] is an 86 y.o. female last assessed via telehealth encounter in January by Ms. Strader PA-C, I reviewed the note.  She is here today with her daughter for a follow-up visit.  Relatively stable from a fluid perspective on present dose of diuretic with potassium supplement.  Her weight is up 5 pounds since January but no peripheral edema, no orthopnea or PND.  She continues to have progressive dementia per discussion with daughter.  I reviewed her medications today which are outlined below.  I personally reviewed her ECG which shows sinus rhythm with frequent PACs.  Past Medical History:  Diagnosis Date   Anxiety    Chronic diastolic (congestive) heart failure (HCC)    Chronic obstructive pulmonary disease, unspecified (HCC)    COPD (chronic obstructive pulmonary disease) (HCC)    GERD (gastroesophageal reflux disease)    Hyperlipidemia    IBS (irritable bowel syndrome)    Lumbago with sciatica    Osteoporosis    Overactive bladder    Pneumonia     Past Surgical History:  Procedure Laterality Date   COLONOSCOPY  10/2009   sigmoid diverticula, multiple tubulovillous adneomas, needs surveillance Oct 2013   ESOPHAGOGASTRODUODENOSCOPY  04/16/2011   Dr. Gala Romney: Noncritical Schatzkis ring( not manipulated because no dysphagia). Normal esophagus otherwise  large hiatal hernia. Gastric polyp-status post biopsy. Gastric erosions-staus post biopsy. Abnormal bulb-status post biopsy. benign small bowel, stomach biopsy with ulcerated gastric antral mucosa with foveolar hyperplasia and surface erosion, polyp with inflamed gastric antral mucosa    ESOPHAGOGASTRODUODENOSCOPY N/A 04/29/2014   Dr. Gala Romney:  Noncritical Schatzki's ring large hiatal hernia. Retained gastric contents.GES with slight delay   HIP PINNING,CANNULATED Right 04/06/2018   Procedure: CANNULATED HIP PINNING;  Surgeon: Carole Civil, MD;  Location: [facility];  Service: Orthopedics;  Laterality: Right;    Current Outpatient Medications  Medication Sig Dispense Refill   albuterol (PROAIR HFA) 108 (90 Base) MCG/ACT inhaler Inhale 2 puffs into the lungs every 4 (four) hours as needed. 8 g 0   albuterol (PROVENTIL) (2.5 MG/3ML) 0.083% nebulizer solution Take 3 mLs (2.5 mg total) by nebulization every 6 (six) hours as needed for wheezing or shortness of breath. 75 mL 0   ALPRAZolam (XANAX) 0.5 MG tablet Take 0.5 mg by mouth 2 (two) times daily.     cetirizine (ZYRTEC) 10 MG tablet Take 10 mg by mouth daily as needed for allergies.     diclofenac sodium (VOLTAREN) 1 % GEL Apply 2 g topically 2 (two) times daily.     donepezil (ARICEPT) 10 MG tablet Take 10 mg by mouth daily.     furosemide (LASIX) 40 MG tablet Take 40 mg by mouth daily.     linaclotide (LINZESS) 145 MCG CAPS capsule Take 1 capsule (145 mcg total) by mouth daily as needed. (Patient taking differently: Take 145 mcg by mouth daily as needed (for constipation).) 30 capsule 0   memantine (NAMENDA) 5 MG tablet Take 5 mg by mouth 2 (two) times daily.     Multiple Vitamin (MULTIVITAMIN WITH MINERALS) TABS tablet Take 1 tablet by mouth daily.     pantoprazole (PROTONIX) 40 MG tablet TAKE (1) TABLET BY MOUTH TWICE  A DAY WITH MEALS (BREAKFAST AND SUPPER) (Patient taking differently: Take 40 mg by mouth 2 (two) times daily before a meal.) 60 tablet 11   potassium chloride (K-DUR) 10 MEQ tablet Take 1 tablet (10 mEq total) by mouth daily. 30 tablet 0   traMADol (ULTRAM) 50 MG tablet Take 1 tablet (50 mg total) by mouth every 6 (six) hours as needed (pain). (Patient taking differently: Take 50 mg by mouth 2 (two) times daily.) 30 tablet 0   umeclidinium-vilanterol (ANORO ELLIPTA)  62.5-25 MCG/INH AEPB Inhale 1 puff into the lungs daily. 60 each 0   zolpidem (AMBIEN) 5 MG tablet Take 1 tablet (5 mg total) by mouth at bedtime as needed for up to 13 days for sleep. X 14 days (Patient taking differently: Take 5 mg by mouth at bedtime.) 5 tablet 0   acetaminophen (TYLENOL) 500 MG tablet Take 500 mg by mouth daily. (Patient not taking: Reported on 08/19/2020)     diclofenac Sodium (VOLTAREN) 1 % GEL APPLY AS DIRECTED TO THE AFFECTED KNEE FOUR TIMES DAILY. (Patient not taking: Reported on 08/19/2020) 500 g 11   HYDROcodone-acetaminophen (NORCO/VICODIN) 5-325 MG tablet Take 1 tablet by mouth every 6 (six) hours as needed for moderate pain. (Patient not taking: Reported on 08/19/2020) 30 tablet 0   lidocaine (XYLOCAINE) 5 % ointment Apply 1 application topically 4 (four) times daily as needed. (Patient not taking: Reported on 08/19/2020) 50 g 0   meloxicam (MOBIC) 7.5 MG tablet TAKE 1 TABLET BY MOUTH TWICE DAILY (Patient not taking: Reported on 08/19/2020) 60 tablet 11   tamsulosin (FLOMAX) 0.4 MG CAPS capsule Take 1 capsule (0.4 mg total) by mouth daily. (Patient not taking: Reported on 08/19/2020) 30 capsule 0   tiZANidine (ZANAFLEX) 4 MG tablet TAKE 1 TABLET BY MOUTH ONCE DAILY. (Patient not taking: Reported on 08/19/2020) 30 tablet 11   No current facility-administered medications for this visit.   Allergies:  Codeine, Codeine, Levofloxacin, Sulfonamide derivatives, and Sulfa antibiotics   ROS: Hearing loss.  No reported falls or syncope.  Physical Exam: VS:  BP 112/64   Pulse 87   Ht '5\' 1"'$  (1.549 m)   Wt 107 lb (48.5 kg)   SpO2 97%   BMI 20.22 kg/m , BMI Body mass index is 20.22 kg/m.  Wt Readings from Last 3 Encounters:  08/19/20 107 lb (48.5 kg)  01/25/20 102 lb (46.3 kg)  11/15/19 104 lb (47.2 kg)    General: Elderly woman, appears comfortable at rest. HEENT: Conjunctiva and lids normal, oropharynx clear with moist mucosa. Neck: Supple, no elevated JVP or carotid  bruits, no thyromegaly. Lungs: Clear to auscultation, nonlabored breathing at rest. Cardiac: Regular rate and rhythm with occasional ectopy, no S3, 1/6 systolic murmur, no pericardial rub. Extremities: No pitting edema.  ECG:  An ECG dated 10/02/2018 was personally reviewed today and demonstrated:  Sinus rhythm.  Recent Labwork:  November 2021: BUN 20, creatinine 1.06, potassium 4.7, LDL 140, hemoglobin A1c 6.0%  Other Studies Reviewed Today:  Echocardiogram 09/30/2018:  1. Left ventricular ejection fraction, by visual estimation, is 60 to  65%. The left ventricle has normal function. Normal left ventricular size.  Normal left ventricular posterior wall thickness. There is no left  ventricular hypertrophy.   2. Left ventricular diastolic Doppler parameters are indeterminate  pattern of LV diastolic filling.   3. There is mild basal septal LV hypertrophy.   4. Global right ventricle has normal systolic function.The right  ventricular size is normal. No increase  in right ventricular wall  thickness.   5. Left atrial size was moderately dilated.   6. Right atrial size was mildly dilated.   7. The mitral valve is normal in structure. Mild mitral valve  regurgitation. No evidence of mitral stenosis.   8. The tricuspid valve is normal in structure. Tricuspid valve  regurgitation moderate.   9. The aortic valve is tricuspid Aortic valve regurgitation was not  visualized by color flow Doppler. Mild aortic valve sclerosis without  stenosis.  10. The pulmonic valve was not well visualized. Pulmonic valve  regurgitation is not visualized by color flow Doppler.  11. Moderately elevated pulmonary artery systolic pressure.  12. Pulmonary HTN is moderate, PASP is 43 mmHg.  13. The inferior vena cava is normal in size with greater than 50%  respiratory variability, suggesting right atrial pressure of 3 mmHg.  Assessment and Plan:  1.  Chronic diastolic heart failure, LVEF 60 to 65% by last  assessment and normal RV contraction as well.  She remains clinically stable on Lasix with potassium supplement, no changes were made today.  Continue to follow lab work with PCP.  2.  COPD, continue to follow with PCP.  3.  Progressive dementia.  She is on Aricept and Namenda with follow-up by neurology.  Medication Adjustments/Labs and Tests Ordered: Current medicines are reviewed at length with the patient today.  Concerns regarding medicines are outlined above.   Tests Ordered: Orders Placed This Encounter  Procedures   EKG 12-Lead    Medication Changes: No orders of the defined types were placed in this encounter.   Disposition:  Follow up  6 months.  Signed, Satira Sark, MD, [facility] 08/19/2020 10:32 AM    [facility] [place]. [address] [place], [place] [address] Phone: [phone]; Fax: [phone]

## 2020-08-19 NOTE — Patient Instructions (Signed)

## 2020-10-31 ENCOUNTER — Telehealth: Payer: Self-pay

## 2020-10-31 NOTE — Telephone Encounter (Signed)
Last office visit by Dr.McDowell was faxed to Rudean Hitt, case manager at [facility]

## 2020-12-25 ENCOUNTER — Emergency Department (HOSPITAL_COMMUNITY)
Admission: EM | Admit: 2020-12-25 | Discharge: 2020-12-25 | Disposition: A | Discharge status: Home / Self Care | Payer: Medicare Other | Attending: Emergency Medicine | Admitting: Emergency Medicine

## 2020-12-25 ENCOUNTER — Encounter (HOSPITAL_COMMUNITY): Payer: Self-pay | Admitting: *Deleted

## 2020-12-25 DIAGNOSIS — Z79899 Other long term (current) drug therapy: Secondary | ICD-10-CM | POA: Insufficient documentation

## 2020-12-25 DIAGNOSIS — Z7722 Contact with and (suspected) exposure to environmental tobacco smoke (acute) (chronic): Secondary | ICD-10-CM | POA: Diagnosis not present

## 2020-12-25 DIAGNOSIS — N390 Urinary tract infection, site not specified: Secondary | ICD-10-CM | POA: Diagnosis not present

## 2020-12-25 DIAGNOSIS — R410 Disorientation, unspecified: Secondary | ICD-10-CM | POA: Insufficient documentation

## 2020-12-25 DIAGNOSIS — I5033 Acute on chronic diastolic (congestive) heart failure: Secondary | ICD-10-CM | POA: Insufficient documentation

## 2020-12-25 DIAGNOSIS — J449 Chronic obstructive pulmonary disease, unspecified: Secondary | ICD-10-CM | POA: Insufficient documentation

## 2020-12-25 DIAGNOSIS — F0150 Vascular dementia without behavioral disturbance: Secondary | ICD-10-CM | POA: Diagnosis not present

## 2020-12-25 LAB — CBC WITH DIFFERENTIAL/PLATELET
Abs Immature Granulocytes: 0.01 10*3/uL (ref 0.00–0.07)
Basophils Absolute: 0.1 10*3/uL (ref 0.0–0.1)
Basophils Relative: 1 %
Eosinophils Absolute: 0.2 10*3/uL (ref 0.0–0.5)
Eosinophils Relative: 2 %
HCT: 34.3 % — ABNORMAL LOW (ref 36.0–46.0)
Hemoglobin: 10.6 g/dL — ABNORMAL LOW (ref 12.0–15.0)
Immature Granulocytes: 0 %
Lymphocytes Relative: 18 %
Lymphs Abs: 1.1 10*3/uL (ref 0.7–4.0)
MCH: 28.2 pg (ref 26.0–34.0)
MCHC: 30.9 g/dL (ref 30.0–36.0)
MCV: 91.2 fL (ref 80.0–100.0)
Monocytes Absolute: 0.9 10*3/uL (ref 0.1–1.0)
Monocytes Relative: 14 %
Neutro Abs: 4.1 10*3/uL (ref 1.7–7.7)
Neutrophils Relative %: 65 %
Platelets: 255 10*3/uL (ref 150–400)
RBC: 3.76 MIL/uL — ABNORMAL LOW (ref 3.87–5.11)
RDW: 14.1 % (ref 11.5–15.5)
WBC: 6.3 10*3/uL (ref 4.0–10.5)
nRBC: 0 % (ref 0.0–0.2)

## 2020-12-25 LAB — URINALYSIS, ROUTINE W REFLEX MICROSCOPIC
Bilirubin Urine: NEGATIVE
Glucose, UA: NEGATIVE mg/dL
Hgb urine dipstick: NEGATIVE
Ketones, ur: NEGATIVE mg/dL
Leukocytes,Ua: NEGATIVE
Nitrite: NEGATIVE
Protein, ur: NEGATIVE mg/dL
Specific Gravity, Urine: 1.015 (ref 1.005–1.030)
pH: 6 (ref 5.0–8.0)

## 2020-12-25 LAB — BASIC METABOLIC PANEL
Anion gap: 13 (ref 5–15)
BUN: 20 mg/dL (ref 8–23)
CO2: 28 mmol/L (ref 22–32)
Calcium: 8.7 mg/dL — ABNORMAL LOW (ref 8.9–10.3)
Chloride: 97 mmol/L — ABNORMAL LOW (ref 98–111)
Creatinine, Ser: 1 mg/dL (ref 0.44–1.00)
GFR, Estimated: 56 mL/min — ABNORMAL LOW (ref >60)
Glucose, Bld: 91 mg/dL (ref 70–99)
Potassium: 4.2 mmol/L (ref 3.5–5.1)
Sodium: 138 mmol/L (ref 135–145)

## 2020-12-25 NOTE — ED Notes (Signed)
Family updated as to patient's status. (Daughter [name])

## 2020-12-25 NOTE — ED Triage Notes (Signed)
Pt in from home via [facility] for family calling out for UTI, per report pt was dx with a UTI x 3 days ago, pt family reports she has been taking antibiotics for the UTI and has been confused, per EMS the pt has been A&O x4 in route

## 2020-12-25 NOTE — ED Provider Notes (Signed)
[facility] EMERGENCY DEPARTMENT Provider Note   CSN: [id number] Arrival date & time: 12/25/20  1532     History Chief Complaint  Patient presents with   Urinary Tract Infection    [name] is a 86 y.o. female.  Patient with history of vascular dementia, presents chief complaint of episodes of confusion and recent urinary tract infection.  Concerned that the patient may have persistent urinary tract infection sent to the ER.  Otherwise no reports of fevers or cough no reports of vomiting or diarrhea.  Patient denies any pain at this time.      Past Medical History:  Diagnosis Date   Anxiety    Chronic diastolic (congestive) heart failure (HCC)    Chronic obstructive pulmonary disease, unspecified (HCC)    COPD (chronic obstructive pulmonary disease) (HCC)    GERD (gastroesophageal reflux disease)    Hyperlipidemia    IBS (irritable bowel syndrome)    Lumbago with sciatica    Osteoporosis    Overactive bladder    Pneumonia     Patient Active Problem List   Diagnosis Date Noted   Chronic pain 04/23/2019   General unsteadiness 04/23/2019   Polyarthropathy 04/23/2019   Pseudobulbar affect 04/23/2019   Tremor 04/23/2019   Chronic obstructive pulmonary disease, unspecified ([place])    Gastro-esophageal reflux disease without esophagitis    Hyperlipidemia, unspecified    Pulmonary fibrosis, unspecified (HCC)    Acute on chronic diastolic (congestive) heart failure (HCC)    Age-related osteoporosis without current pathological fracture    Anxiety disorder, unspecified    Chest pain, unspecified    Chronic diastolic (congestive) heart failure ([name])    Collapsed vertebra, not elsewhere classified, site unspecified, subsequent encounter for fracture with routine healing    Edema, unspecified    Lumbago with sciatica    Overactive bladder    Pain in unspecified hand    Pressure ulcer of unspecified site, unspecified stage    Unspecified protein-calorie malnutrition  ([place])    Acute diastolic heart failure ([place]) 09/30/2018   Dyspnea 09/29/2018   Pneumonia 06/30/2018   Acute CHF (congestive heart failure) ([place]) 06/29/2018   Vascular dementia without behavioral disturbance ([place]) 06/26/2018   CAP (community acquired pneumonia) 06/23/2018   Bilateral pleural effusion 06/23/2018   Leukocytosis 06/20/2018   Chronic anemia 06/20/2018   Cognitive impairment 06/19/2018   Bilateral lower extremity edema 06/18/2018   Protein-calorie malnutrition, severe ([place]) 06/18/2018   S/P internal fixation right hip fracture cannulated screw placement 04/06/18 04/27/2018   Urinary tract infection associated with catheterization of urinary tract ([place]) 04/11/2018   Altered mental status 04/10/2018   Anxiety 04/10/2018   Hyponatremia 04/10/2018   UTI (urinary tract infection) due to urinary indwelling catheter ([place]) 04/10/2018   Chronic constipation 04/09/2018   Urinary retention 04/09/2018   Closed right hip fracture ([place]) 04/05/2018   GERD (gastroesophageal reflux disease) 04/05/2018   COPD with acute exacerbation ([place]) 04/05/2018   Hiatal hernia    Dyspepsia 04/04/2014   Hematochezia 04/30/2011   RUQ pain 03/25/2011   Adenomatous polyps 03/25/2011   ABDOMINAL PAIN, LEFT LOWER QUADRANT 10/16/2009   HYPERLIPIDEMIA 10/09/2009   BRONCHITIS 10/09/2009   Osteoporosis 10/09/2009    Past Surgical History:  Procedure Laterality Date   COLONOSCOPY  10/2009   sigmoid diverticula, multiple tubulovillous adneomas, needs surveillance Oct 2013   ESOPHAGOGASTRODUODENOSCOPY  04/16/2011   Dr. Gala Romney: Noncritical Schatzkis ring( not manipulated because no dysphagia). Normal esophagus otherwise  large hiatal hernia. Gastric polyp-status post biopsy.  Gastric erosions-staus post biopsy. Abnormal bulb-status post biopsy. benign small bowel, stomach biopsy with ulcerated gastric antral mucosa with foveolar hyperplasia and surface erosion, polyp with inflamed gastric antral mucosa     ESOPHAGOGASTRODUODENOSCOPY N/A 04/29/2014   Dr. Gala Romney: Noncritical Schatzki's ring large hiatal hernia. Retained gastric contents.GES with slight delay   HIP PINNING,CANNULATED Right 04/06/2018   Procedure: CANNULATED HIP PINNING;  Surgeon: Carole Civil, MD;  Location: [facility];  Service: Orthopedics;  Laterality: Right;     OB History     Gravida  3   Para  3   Term  3   Preterm  0   AB  0   Living         SAB  0   IAB  0   Ectopic  0   Multiple      Live Births              Family History  Problem Relation Age of Onset   Stroke Mother    Diabetes Mother    Heart disease Father    Colon cancer Neg Hx     Social History   Tobacco Use   Smoking status: Passive Smoke Exposure - Never Smoker   Smokeless tobacco: Current    Types: Snuff  Vaping Use   Vaping Use: Never used  Substance Use Topics   Alcohol use: No   Drug use: No    Home Medications Prior to Admission medications   Medication Sig Start Date End Date Taking? Authorizing Provider  acetaminophen (TYLENOL) 650 MG CR tablet Take 650 mg by mouth every 8 (eight) hours as needed for pain.   Yes [provider]  albuterol (PROAIR HFA) 108 (90 Base) MCG/ACT inhaler Inhale 2 puffs into the lungs every 4 (four) hours as needed. 06/23/18  Yes Gerlene Fee, NP  albuterol (PROVENTIL) (2.5 MG/3ML) 0.083% nebulizer solution Take 3 mLs (2.5 mg total) by nebulization every 6 (six) hours as needed for wheezing or shortness of breath. 06/23/18  Yes Gerlene Fee, NP  ALPRAZolam Duanne Moron) 0.5 MG tablet Take 0.5 mg by mouth 2 (two) times daily.   Yes [provider]  cetirizine (ZYRTEC) 10 MG tablet Take 10 mg by mouth daily as needed for allergies. 06/16/18  Yes [provider]  diclofenac sodium (VOLTAREN) 1 % GEL Apply 2 g topically 2 (two) times daily.   Yes [provider]  donepezil (ARICEPT) 10 MG tablet Take 10 mg by mouth daily. 07/25/19  Yes [provider]  furosemide (LASIX) 40 MG tablet Take 40 mg by mouth daily.   Yes [provider]  linaclotide (LINZESS) 145 MCG CAPS capsule Take 1 capsule (145 mcg total) by mouth daily as needed. Patient taking differently: Take 145 mcg by mouth daily as needed (for constipation). 06/23/18  Yes Gerlene Fee, NP  memantine (NAMENDA) 10 MG tablet Take 10 mg by mouth 2 (two) times daily.   Yes [provider]  Multiple Vitamin (MULTIVITAMIN WITH MINERALS) TABS tablet Take 1 tablet by mouth daily.   Yes [provider]  nitrofurantoin, macrocrystal-monohydrate, (MACROBID) 100 MG capsule Take 100 mg by mouth 2 (two) times daily. 12/22/20  Yes [provider]  pantoprazole (PROTONIX) 40 MG tablet TAKE (1) TABLET BY MOUTH TWICE A DAY WITH MEALS (BREAKFAST AND SUPPER) Patient taking differently: Take 40 mg by mouth 2 (two) times daily before a meal. 05/24/19  Yes Carlis Stable, NP  potassium chloride (K-DUR)  10 MEQ tablet Take 1 tablet (10 mEq total) by mouth daily. 06/23/18  Yes Gerlene Fee, NP  traMADol (ULTRAM) 50 MG tablet Take 1 tablet (50 mg total) by mouth every 6 (six) hours as needed (pain). Patient taking differently: Take 50 mg by mouth every 4 (four) hours as needed for moderate pain. 07/04/18  Yes [name], MD  umeclidinium-vilanterol [facility] ELLIPTA) 62.5-25 MCG/INH AEPB Inhale 1 puff into the lungs daily. 06/23/18  Yes Gerlene Fee, NP  zolpidem (AMBIEN) 5 MG tablet Take 1 tablet (5 mg total) by mouth at bedtime as needed for up to 13 days for sleep. X 14 days Patient taking differently: Take 5 mg by mouth at bedtime. 06/23/18 12/25/20 Yes Gerlene Fee, NP  acetaminophen (TYLENOL) 500 MG tablet Take 500 mg by mouth daily. Patient not taking: Reported on 08/19/2020    [provider]  diclofenac Sodium (VOLTAREN) 1 % GEL APPLY AS DIRECTED TO THE AFFECTED KNEE FOUR TIMES DAILY. Patient not taking: Reported on 08/19/2020 08/04/20   Carole Civil, MD  HYDROcodone-acetaminophen (NORCO/VICODIN) 5-325 MG tablet Take 1 tablet by mouth every 6 (six) hours as needed for moderate pain. Patient not taking: Reported on 08/19/2020 11/15/19   Carole Civil, MD  lidocaine (XYLOCAINE) 5 % ointment Apply 1 application topically 4 (four) times daily as needed. Patient not taking: Reported on 08/19/2020 11/10/19   Margette Fast, MD  meloxicam (MOBIC) 7.5 MG tablet TAKE 1 TABLET BY MOUTH TWICE DAILY Patient not taking: Reported on 08/19/2020 06/20/19   Carole Civil, MD  memantine (NAMENDA) 5 MG tablet Take 5 mg by mouth 2 (two) times daily. Patient not taking: Reported on 12/25/2020 08/02/20   [provider]  tamsulosin (FLOMAX) 0.4 MG CAPS capsule Take 1 capsule (0.4 mg total) by mouth daily. Patient not taking: Reported on 08/19/2020 06/23/18   Gerlene Fee, NP  tiZANidine (ZANAFLEX) 4 MG tablet TAKE 1 TABLET BY MOUTH ONCE DAILY. Patient not taking: Reported on 08/19/2020 12/11/19   Carole Civil, MD  traMADol (ULTRAM) 50 MG tablet tramadol 50 mg tablet  Take 2 tablets every 6 hours by oral route as needed.  Maximum 6 a day Patient not taking: Reported on 12/25/2020    [provider]    Allergies    Codeine, Codeine, Levofloxacin, Sulfonamide derivatives, and Sulfa antibiotics  Review of Systems   Review of Systems  Constitutional:  Negative for fever.  HENT:  Negative for ear pain.   Eyes:  Negative for pain.  Respiratory:  Negative for cough.   Cardiovascular:  Negative for chest pain.  Gastrointestinal:  Negative for abdominal pain.  Genitourinary:  Negative for flank pain.  Musculoskeletal:  Negative for back pain.  Skin:  Negative for rash.  Neurological:  Negative for headaches.   Physical Exam Updated Vital Signs BP 118/82    Pulse 74    Temp 98.2 F (36.8 C) (Oral)    Resp 10    SpO2 94%   Physical Exam Constitutional:      General: She is not in acute distress.    Appearance:  Normal appearance.  HENT:     Head: Normocephalic.     Nose: Nose normal.  Eyes:     Extraocular Movements: Extraocular movements intact.  Cardiovascular:     Rate and Rhythm: Normal rate.  Pulmonary:     Effort: Pulmonary effort is normal.  Musculoskeletal:        General: Normal  range of motion.     Cervical back: Normal range of motion.  Neurological:     General: No focal deficit present.     Mental Status: She is alert. Mental status is at baseline.    ED Results / Procedures / Treatments   Labs (all labs ordered are listed, but only abnormal results are displayed) Labs Reviewed  CBC WITH DIFFERENTIAL/PLATELET - Abnormal; Notable for the following components:      Result Value   RBC 3.76 (*)    Hemoglobin 10.6 (*)    HCT 34.3 (*)    All other components within normal limits  BASIC METABOLIC PANEL - Abnormal; Notable for the following components:   Chloride 97 (*)    Calcium 8.7 (*)    GFR, Estimated 56 (*)    All other components within normal limits  URINE CULTURE  URINALYSIS, ROUTINE W REFLEX MICROSCOPIC    EKG None  Radiology No results found.  Procedures Procedures   Medications Ordered in ED Medications - No data to display  ED Course  I have reviewed the triage vital signs and the nursing notes.  Pertinent labs & imaging results that were available during my care of the patient were reviewed by me and considered in my medical decision making (see chart for details).    MDM Rules/Calculators/A&P                         Vital signs within normal limits afebrile normal blood pressure.  Labs are sent is unremarkable white count 6 hemoglobin 10 chemistry unremarkable as well.  Urinalysis sent and no evidence of infection.  Recommending outpatient follow-up with her doctor again within 2 to 3 days, recommend immediate return for worsening symptoms or any additional concerns.     Final Clinical Impression(s) / ED Diagnoses Final diagnoses:   Episode of confusion    Rx / DC Orders ED Discharge Orders     None        Luna Fuse, MD 12/25/20 1902

## 2020-12-25 NOTE — Discharge Instructions (Signed)
Call your primary care doctor or specialist as discussed in the next 2-3 days.   Return immediately back to the ER if:  Your symptoms worsen within the next 12-24 hours. You develop new symptoms such as new fevers, persistent vomiting, new pain, shortness of breath, or new weakness or numbness, or if you have any other concerns.  

## 2020-12-27 LAB — URINE CULTURE: Culture: NO GROWTH

## 2021-01-04 DIAGNOSIS — Z79891 Long term (current) use of opiate analgesic: Secondary | ICD-10-CM | POA: Insufficient documentation

## 2021-01-04 HISTORY — PX: OTHER SURGICAL HISTORY: SHX169

## 2021-03-14 ENCOUNTER — Emergency Department (HOSPITAL_COMMUNITY): Payer: Medicare Other

## 2021-03-14 ENCOUNTER — Other Ambulatory Visit: Payer: Self-pay

## 2021-03-14 ENCOUNTER — Emergency Department (HOSPITAL_COMMUNITY)
Admission: EM | Admit: 2021-03-14 | Discharge: 2021-03-14 | Disposition: A | Discharge status: Home / Self Care | Payer: Medicare Other | Attending: Emergency Medicine | Admitting: Emergency Medicine

## 2021-03-14 ENCOUNTER — Encounter (HOSPITAL_COMMUNITY): Payer: Self-pay

## 2021-03-14 DIAGNOSIS — Z20822 Contact with and (suspected) exposure to covid-19: Secondary | ICD-10-CM | POA: Insufficient documentation

## 2021-03-14 DIAGNOSIS — M25511 Pain in right shoulder: Secondary | ICD-10-CM

## 2021-03-14 DIAGNOSIS — X58XXXA Exposure to other specified factors, initial encounter: Secondary | ICD-10-CM | POA: Insufficient documentation

## 2021-03-14 DIAGNOSIS — S46911A Strain of unspecified muscle, fascia and tendon at shoulder and upper arm level, right arm, initial encounter: Secondary | ICD-10-CM | POA: Insufficient documentation

## 2021-03-14 DIAGNOSIS — R059 Cough, unspecified: Secondary | ICD-10-CM | POA: Insufficient documentation

## 2021-03-14 DIAGNOSIS — R058 Other specified cough: Secondary | ICD-10-CM

## 2021-03-14 LAB — RESP PANEL BY RT-PCR (FLU A&B, COVID) ARPGX2
Influenza A by PCR: NEGATIVE
Influenza B by PCR: NEGATIVE
SARS Coronavirus 2 by RT PCR: NEGATIVE

## 2021-03-14 MED ORDER — ACETAMINOPHEN 325 MG PO TABS
650.0000 mg | ORAL_TABLET | Freq: Once | ORAL | Status: AC
  Administered 2021-03-14: 650 mg via ORAL
  Filled 2021-03-14: qty 2

## 2021-03-14 MED ORDER — METHOCARBAMOL 500 MG PO TABS
500.0000 mg | ORAL_TABLET | Freq: Once | ORAL | Status: AC
  Administered 2021-03-14: 500 mg via ORAL
  Filled 2021-03-14: qty 1

## 2021-03-14 MED ORDER — METHOCARBAMOL 500 MG PO TABS: 500.0000 mg | ORAL_TABLET | Freq: Three times a day (TID) | ORAL | 0 refills | Status: DC | PRN

## 2021-03-14 MED ORDER — LIDOCAINE 5 % EX PTCH
1.0000 | MEDICATED_PATCH | Freq: Once | CUTANEOUS | Status: DC
  Administered 2021-03-14: 1 via TRANSDERMAL
  Filled 2021-03-14: qty 1

## 2021-03-14 NOTE — ED Notes (Signed)
Called grandson who is going to try and get in touch with patients daughter and son Octavia Bruckner is going to get in touch with son in law.  ?

## 2021-03-14 NOTE — ED Provider Notes (Signed)
[facility] EMERGENCY DEPARTMENT Provider Note   CSN: [id number] Arrival date & time: 03/14/21  1038     History  Chief Complaint  Patient presents with   Cough   Shoulder Pain    [name] is a 87 y.o. female.  Patient c/o recent non prod cough, and today indicates when coughing spell feels pulled muscle as right shoulder pain. Cough has been intermittent in past two weeks. Right shoulder pain acute onset today, dull, non radiating, worse w palpation shoulder and movement of shoulder. No neck pain or radicular pain. No numbness/weakness. No chest pain or discomfort. No sob or unusual doe. No fever or chills.   The history is provided by the patient and medical records.  Cough Associated symptoms: no chest pain, no chills, no fever, no headaches, no rash, no shortness of breath and no sore throat   Shoulder Pain Associated symptoms: no back pain, no fever and no neck pain       Home Medications Prior to Admission medications   Medication Sig Start Date End Date Taking? Authorizing Provider  acetaminophen (TYLENOL) 500 MG tablet Take 500 mg by mouth daily. Patient not taking: Reported on 08/19/2020    [provider]  acetaminophen (TYLENOL) 650 MG CR tablet Take 650 mg by mouth every 8 (eight) hours as needed for pain.    [provider]  albuterol (PROAIR HFA) 108 (90 Base) MCG/ACT inhaler Inhale 2 puffs into the lungs every 4 (four) hours as needed. 06/23/18   Gerlene Fee, NP  albuterol (PROVENTIL) (2.5 MG/3ML) 0.083% nebulizer solution Take 3 mLs (2.5 mg total) by nebulization every 6 (six) hours as needed for wheezing or shortness of breath. 06/23/18   Gerlene Fee, NP  ALPRAZolam Duanne Moron) 0.5 MG tablet Take 0.5 mg by mouth 2 (two) times daily.    [provider]  cetirizine (ZYRTEC) 10 MG tablet Take 10 mg by mouth daily as needed for allergies. 06/16/18   [provider]  diclofenac sodium (VOLTAREN) 1 % GEL Apply 2 g topically 2  (two) times daily.    [provider]  diclofenac Sodium (VOLTAREN) 1 % GEL APPLY AS DIRECTED TO THE AFFECTED KNEE FOUR TIMES DAILY. Patient not taking: Reported on 08/19/2020 08/04/20   Carole Civil, MD  donepezil (ARICEPT) 10 MG tablet Take 10 mg by mouth daily. 07/25/19   [provider]  furosemide (LASIX) 40 MG tablet Take 40 mg by mouth daily.    [provider]  HYDROcodone-acetaminophen (NORCO/VICODIN) 5-325 MG tablet Take 1 tablet by mouth every 6 (six) hours as needed for moderate pain. Patient not taking: Reported on 08/19/2020 11/15/19   Carole Civil, MD  lidocaine (XYLOCAINE) 5 % ointment Apply 1 application topically 4 (four) times daily as needed. Patient not taking: Reported on 08/19/2020 11/10/19   Margette Fast, MD  linaclotide [facility]) 145 MCG CAPS capsule Take 1 capsule (145 mcg total) by mouth daily as needed. Patient taking differently: Take 145 mcg by mouth daily as needed (for constipation). 06/23/18   Gerlene Fee, NP  meloxicam (MOBIC) 7.5 MG tablet TAKE 1 TABLET BY MOUTH TWICE DAILY Patient not taking: Reported on 08/19/2020 06/20/19   Carole Civil, MD  memantine (NAMENDA) 10 MG tablet Take 10 mg by mouth 2 (two) times daily.    [provider]  memantine (NAMENDA) 5 MG tablet Take 5 mg by mouth 2 (two) times daily. Patient not taking: Reported on 12/25/2020 08/02/20  [provider]  Multiple Vitamin (MULTIVITAMIN WITH MINERALS) TABS tablet Take 1 tablet by mouth daily.    [provider]  nitrofurantoin, macrocrystal-monohydrate, (MACROBID) 100 MG capsule Take 100 mg by mouth 2 (two) times daily. 12/22/20   [provider]  pantoprazole (PROTONIX) 40 MG tablet TAKE (1) TABLET BY MOUTH TWICE A DAY WITH MEALS (BREAKFAST AND SUPPER) Patient taking differently: Take 40 mg by mouth 2 (two) times daily before a meal. 05/24/19   Carlis Stable, NP  potassium chloride (K-DUR) 10 MEQ tablet Take 1  tablet (10 mEq total) by mouth daily. 06/23/18   Gerlene Fee, NP  tamsulosin (FLOMAX) 0.4 MG CAPS capsule Take 1 capsule (0.4 mg total) by mouth daily. Patient not taking: Reported on 08/19/2020 06/23/18   Gerlene Fee, NP  tiZANidine (ZANAFLEX) 4 MG tablet TAKE 1 TABLET BY MOUTH ONCE DAILY. Patient not taking: Reported on 08/19/2020 12/11/19   Carole Civil, MD  traMADol (ULTRAM) 50 MG tablet Take 1 tablet (50 mg total) by mouth every 6 (six) hours as needed (pain). Patient taking differently: Take 50 mg by mouth every 4 (four) hours as needed for moderate pain. 07/04/18   [name], MD  traMADol (ULTRAM) 50 MG tablet tramadol 50 mg tablet  Take 2 tablets every 6 hours by oral route as needed.  Maximum 6 a day Patient not taking: Reported on 12/25/2020    [provider]  umeclidinium-vilanterol (ANORO ELLIPTA) 62.5-25 MCG/INH AEPB Inhale 1 puff into the lungs daily. 06/23/18   Gerlene Fee, NP  zolpidem (AMBIEN) 5 MG tablet Take 1 tablet (5 mg total) by mouth at bedtime as needed for up to 13 days for sleep. X 14 days Patient taking differently: Take 5 mg by mouth at bedtime. 06/23/18 12/25/20  Gerlene Fee, NP      Allergies    Codeine, Codeine, Levofloxacin, Sulfonamide derivatives, and Sulfa antibiotics    Review of Systems   Review of Systems  Constitutional:  Negative for chills and fever.  HENT:  Negative for sore throat.   Eyes:  Negative for redness.  Respiratory:  Positive for cough. Negative for shortness of breath.   Cardiovascular:  Negative for chest pain.  Gastrointestinal:  Negative for abdominal pain.  Genitourinary:  Negative for flank pain.  Musculoskeletal:  Negative for back pain and neck pain.  Skin:  Negative for rash.  Neurological:  Negative for headaches.  Hematological:  Does not bruise/bleed easily.  Psychiatric/Behavioral:  Negative for confusion.    Physical Exam Updated Vital Signs BP (!) 141/100 (BP Location: Left Arm)     Pulse 81    Temp 98 F (36.7 C) (Oral)    Resp 20    Ht 1.524 m (5')    Wt 49.9 kg    SpO2 96%    BMI 21.48 kg/m  Physical Exam Vitals and nursing note reviewed.  Constitutional:      Appearance: Normal appearance. She is well-developed.  HENT:     Head: Atraumatic.     Nose: Nose normal.     Mouth/Throat:     Mouth: Mucous membranes are moist.  Eyes:     General: No scleral icterus.    Conjunctiva/sclera: Conjunctivae normal.  Neck:     Trachea: No tracheal deviation.  Cardiovascular:     Rate and Rhythm: Normal rate and regular rhythm.     Pulses: Normal pulses.     Heart sounds: Normal heart sounds. No murmur heard.  No friction rub. No gallop.  Pulmonary:     Effort: Pulmonary effort is normal. No respiratory distress.     Breath sounds: Normal breath sounds.  Abdominal:     General: Bowel sounds are normal. There is no distension.     Palpations: Abdomen is soft.     Tenderness: There is no abdominal tenderness. There is no guarding.  Genitourinary:    Comments: No cva tenderness.  Musculoskeletal:        General: No swelling.     Cervical back: Normal range of motion and neck supple. No rigidity. No muscular tenderness.     Right lower leg: No edema.     Left lower leg: No edema.     Comments: Kyphosis/scoliosis, spine aligned, no step off. Good rom c spine without pain. Tenderness right shoulder posteriorly and right trap muscle. No deformity. No pain w slow/passive rom shoulder. No significant sts or skin lesions/changes/redness in area of pain.   Skin:    General: Skin is warm and dry.     Findings: No rash.  Neurological:     Mental Status: She is alert.     Comments: Alert, speech normal. Motor/sens grossly intact bil.   Psychiatric:        Mood and Affect: Mood normal.    ED Results / Procedures / Treatments   Labs (all labs ordered are listed, but only abnormal results are displayed) Results for orders placed or performed during the hospital  encounter of 03/14/21  Resp Panel by RT-PCR (Flu A&B, Covid) Nasopharyngeal Swab   Specimen: Nasopharyngeal Swab; Nasopharyngeal(NP) swabs in vial transport medium  Result Value Ref Range   SARS Coronavirus 2 by RT PCR NEGATIVE NEGATIVE   Influenza A by PCR NEGATIVE NEGATIVE   Influenza B by PCR NEGATIVE NEGATIVE   DG Chest 2 View  Result Date: 03/14/2021 CLINICAL DATA:  87 year old female with history of cough and shortness of breath for several weeks. Pain. EXAM: CHEST - 2 VIEW COMPARISON:  Chest x-ray 10/18/2018. FINDINGS: Diffuse interstitial prominence throughout the lungs, similar to prior examinations, likely reflective of a background of chronic interstitial lung disease. No definite acute consolidative airspace disease. No pleural effusions. No pneumothorax. No evidence of pulmonary edema. Heart size is mildly enlarged. Large hiatal hernia again noted. Upper mediastinal contours are distorted by patient positioning. Atherosclerotic calcifications in the thoracic aorta. Old healed fracture of the right humeral neck with mild posttraumatic deformity, similar to prior examinations. IMPRESSION: 1. The appearance of the lungs suggests chronic interstitial lung disease. This could be better evaluated with follow-up nonemergent high-resolution chest CT if clinically appropriate. 2. No definite acute findings. 3. Large hiatal hernia. 4. Aortic atherosclerosis. Electronically Signed   By: Vinnie Langton M.D.   On: 03/14/2021 11:52   DG Shoulder Right  Result Date: 03/14/2021 CLINICAL DATA:  Shoulder pain EXAM: RIGHT SHOULDER - 2+ VIEW COMPARISON:  Chest radiograph 10/18/2018 FINDINGS: Stable chronic deformity of the right proximal humerus due to an old healed surgical neck fracture. Mild spurring of the glenoid. Stable deformity of the right distal clavicle, no change from prior exams, likely from a healed fracture. Right rib deformities likely from old healed right rib fractures. IMPRESSION: 1. Old  deformities of the right proximal humerus, right distal clavicle, and right ribs related to old healed fractures. Electronically Signed   By: Van Clines M.D.   On: 03/14/2021 11:53     EKG None  Radiology DG Chest 2 View  Result Date:  03/14/2021 CLINICAL DATA:  87 year old female with history of cough and shortness of breath for several weeks. Pain. EXAM: CHEST - 2 VIEW COMPARISON:  Chest x-ray 10/18/2018. FINDINGS: Diffuse interstitial prominence throughout the lungs, similar to prior examinations, likely reflective of a background of chronic interstitial lung disease. No definite acute consolidative airspace disease. No pleural effusions. No pneumothorax. No evidence of pulmonary edema. Heart size is mildly enlarged. Large hiatal hernia again noted. Upper mediastinal contours are distorted by patient positioning. Atherosclerotic calcifications in the thoracic aorta. Old healed fracture of the right humeral neck with mild posttraumatic deformity, similar to prior examinations. IMPRESSION: 1. The appearance of the lungs suggests chronic interstitial lung disease. This could be better evaluated with follow-up nonemergent high-resolution chest CT if clinically appropriate. 2. No definite acute findings. 3. Large hiatal hernia. 4. Aortic atherosclerosis. Electronically Signed   By: Vinnie Langton M.D.   On: 03/14/2021 11:52   DG Shoulder Right  Result Date: 03/14/2021 CLINICAL DATA:  Shoulder pain EXAM: RIGHT SHOULDER - 2+ VIEW COMPARISON:  Chest radiograph 10/18/2018 FINDINGS: Stable chronic deformity of the right proximal humerus due to an old healed surgical neck fracture. Mild spurring of the glenoid. Stable deformity of the right distal clavicle, no change from prior exams, likely from a healed fracture. Right rib deformities likely from old healed right rib fractures. IMPRESSION: 1. Old deformities of the right proximal humerus, right distal clavicle, and right ribs related to old healed  fractures. Electronically Signed   By: Van Clines M.D.   On: 03/14/2021 11:53    Procedures Procedures    Medications Ordered in ED Medications - No data to display  ED Course/ Medical Decision Making/ A&P                           Medical Decision Making Problems Addressed: Acute pain of right shoulder: acute illness or injury Non-productive cough: acute illness or injury with systemic symptoms Strain of right shoulder, initial encounter: acute illness or injury  Amount and/or Complexity of Data Reviewed External Data Reviewed: notes. Labs: ordered. Decision-making details documented in ED Course. Radiology: ordered and independent interpretation performed. Decision-making details documented in ED Course.  Risk OTC drugs. Prescription drug management.   Labs and imaging ordered.   Continuous pulse ox and cardiac monitoring.   Reviewed nursing notes and prior charts for additional history. External reports reviewed.  Labs reviewed/interpreted by me - covid neg.   Xrays reviewed/interpreted by me - no fx.   Acetaminophen po. Robaxin po. Lidoderm patch applied.  Recheck pt comfortable, no distress.  Pt appears stable for d/c.  Rec pcp f/u.  Return precautions provided.           Final Clinical Impression(s) / ED Diagnoses Final diagnoses:  None    Rx / DC Orders ED Discharge Orders     None         Lajean Saver, MD 03/14/21 1219

## 2021-03-14 NOTE — ED Notes (Signed)
Spoke with daughter Kalman Shan and went over findings and discharge instructions.  ?

## 2021-03-14 NOTE — ED Triage Notes (Signed)
Patient from home hx of COPD has had cough and sob x 3 weeks.  Used neb this am with relief but when she coughed she started having pain in right shoulder.  Increased pain with movement.  ?

## 2021-03-14 NOTE — Discharge Instructions (Addendum)
It was our pleasure to provide your ER care today - we hope that you feel better. ? ?Take acetaminophen or ibuprofen as need for pain. You may also try lidoderm or similar over the counter patch for symptom relief.  You may try robaxin as need for muscle pain/spasm - no driving when taking.  ? ?Follow up with your doctor in one week if symptoms fail to improve/resolve. ? ?Return to ER if worse, new symptoms, fevers, chest pain, trouble breathing, or other concern.  ?

## 2021-05-13 ENCOUNTER — Other Ambulatory Visit: Payer: Self-pay

## 2021-05-13 ENCOUNTER — Encounter: Payer: Self-pay | Admitting: Emergency Medicine

## 2021-05-13 ENCOUNTER — Ambulatory Visit: Admission: EM | Admit: 2021-05-13 | Discharge: 2021-05-13 | Payer: Medicare Other

## 2021-05-13 ENCOUNTER — Encounter (HOSPITAL_COMMUNITY): Payer: Self-pay | Admitting: Emergency Medicine

## 2021-05-13 ENCOUNTER — Inpatient Hospital Stay (HOSPITAL_COMMUNITY)
Admission: EM | Admit: 2021-05-13 | Discharge: 2021-05-16 | DRG: 190 | Disposition: A | Discharge status: Home / Self Care | Payer: Medicare Other | Attending: Internal Medicine | Admitting: Internal Medicine

## 2021-05-13 ENCOUNTER — Emergency Department (HOSPITAL_COMMUNITY): Payer: Medicare Other

## 2021-05-13 DIAGNOSIS — E785 Hyperlipidemia, unspecified: Secondary | ICD-10-CM | POA: Diagnosis present

## 2021-05-13 DIAGNOSIS — Z882 Allergy status to sulfonamides status: Secondary | ICD-10-CM

## 2021-05-13 DIAGNOSIS — F32A Depression, unspecified: Secondary | ICD-10-CM | POA: Diagnosis not present

## 2021-05-13 DIAGNOSIS — J441 Chronic obstructive pulmonary disease with (acute) exacerbation: Secondary | ICD-10-CM | POA: Diagnosis present

## 2021-05-13 DIAGNOSIS — F17220 Nicotine dependence, chewing tobacco, uncomplicated: Secondary | ICD-10-CM | POA: Diagnosis present

## 2021-05-13 DIAGNOSIS — D649 Anemia, unspecified: Secondary | ICD-10-CM | POA: Diagnosis present

## 2021-05-13 DIAGNOSIS — F0393 Unspecified dementia, unspecified severity, with mood disturbance: Secondary | ICD-10-CM | POA: Diagnosis present

## 2021-05-13 DIAGNOSIS — K219 Gastro-esophageal reflux disease without esophagitis: Secondary | ICD-10-CM | POA: Diagnosis present

## 2021-05-13 DIAGNOSIS — Z20822 Contact with and (suspected) exposure to covid-19: Secondary | ICD-10-CM | POA: Diagnosis present

## 2021-05-13 DIAGNOSIS — J9601 Acute respiratory failure with hypoxia: Secondary | ICD-10-CM | POA: Diagnosis present

## 2021-05-13 DIAGNOSIS — Z66 Do not resuscitate: Secondary | ICD-10-CM | POA: Diagnosis present

## 2021-05-13 DIAGNOSIS — M810 Age-related osteoporosis without current pathological fracture: Secondary | ICD-10-CM | POA: Diagnosis present

## 2021-05-13 DIAGNOSIS — I5032 Chronic diastolic (congestive) heart failure: Secondary | ICD-10-CM | POA: Diagnosis present

## 2021-05-13 DIAGNOSIS — Z881 Allergy status to other antibiotic agents status: Secondary | ICD-10-CM | POA: Diagnosis not present

## 2021-05-13 DIAGNOSIS — Z79899 Other long term (current) drug therapy: Secondary | ICD-10-CM | POA: Diagnosis not present

## 2021-05-13 DIAGNOSIS — Z885 Allergy status to narcotic agent status: Secondary | ICD-10-CM

## 2021-05-13 LAB — BASIC METABOLIC PANEL
Anion gap: 9 (ref 5–15)
BUN: 19 mg/dL (ref 8–23)
CO2: 30 mmol/L (ref 22–32)
Calcium: 9 mg/dL (ref 8.9–10.3)
Chloride: 97 mmol/L — ABNORMAL LOW (ref 98–111)
Creatinine, Ser: 1.17 mg/dL — ABNORMAL HIGH (ref 0.44–1.00)
GFR, Estimated: 46 mL/min — ABNORMAL LOW (ref >60)
Glucose, Bld: 88 mg/dL (ref 70–99)
Potassium: 4.2 mmol/L (ref 3.5–5.1)
Sodium: 136 mmol/L (ref 135–145)

## 2021-05-13 LAB — CBC WITH DIFFERENTIAL/PLATELET
Abs Immature Granulocytes: 0.02 10*3/uL (ref 0.00–0.07)
Basophils Absolute: 0.1 10*3/uL (ref 0.0–0.1)
Basophils Relative: 1 %
Eosinophils Absolute: 0.6 10*3/uL — ABNORMAL HIGH (ref 0.0–0.5)
Eosinophils Relative: 7 %
HCT: 34.7 % — ABNORMAL LOW (ref 36.0–46.0)
Hemoglobin: 11 g/dL — ABNORMAL LOW (ref 12.0–15.0)
Immature Granulocytes: 0 %
Lymphocytes Relative: 17 %
Lymphs Abs: 1.4 10*3/uL (ref 0.7–4.0)
MCH: 28.4 pg (ref 26.0–34.0)
MCHC: 31.7 g/dL (ref 30.0–36.0)
MCV: 89.4 fL (ref 80.0–100.0)
Monocytes Absolute: 1 10*3/uL (ref 0.1–1.0)
Monocytes Relative: 12 %
Neutro Abs: 5.4 10*3/uL (ref 1.7–7.7)
Neutrophils Relative %: 63 %
Platelets: 305 10*3/uL (ref 150–400)
RBC: 3.88 MIL/uL (ref 3.87–5.11)
RDW: 13.2 % (ref 11.5–15.5)
WBC: 8.5 10*3/uL (ref 4.0–10.5)
nRBC: 0 % (ref 0.0–0.2)

## 2021-05-13 LAB — TROPONIN I (HIGH SENSITIVITY)
Troponin I (High Sensitivity): 3 ng/L (ref <18)
Troponin I (High Sensitivity): 3 ng/L (ref <18)

## 2021-05-13 LAB — BRAIN NATRIURETIC PEPTIDE: B Natriuretic Peptide: 63 pg/mL (ref 0.0–100.0)

## 2021-05-13 MED ORDER — IPRATROPIUM-ALBUTEROL 0.5-2.5 (3) MG/3ML IN SOLN: 3.0000 mL | RESPIRATORY_TRACT | Status: DC | PRN

## 2021-05-13 MED ORDER — GUAIFENESIN-DM 100-10 MG/5ML PO SYRP
5.0000 mL | ORAL_SOLUTION | Freq: Four times a day (QID) | ORAL | Status: DC
  Administered 2021-05-14 – 2021-05-16 (×10): 5 mL via ORAL
  Filled 2021-05-13 (×10): qty 5

## 2021-05-13 MED ORDER — ONDANSETRON HCL 4 MG/2ML IJ SOLN
4.0000 mg | Freq: Four times a day (QID) | INTRAMUSCULAR | Status: DC | PRN
Start: 2021-05-13 — End: 2021-05-16

## 2021-05-13 MED ORDER — IPRATROPIUM-ALBUTEROL 0.5-2.5 (3) MG/3ML IN SOLN
3.0000 mL | Freq: Once | RESPIRATORY_TRACT | Status: AC
  Administered 2021-05-13: 3 mL via RESPIRATORY_TRACT
  Filled 2021-05-13: qty 3

## 2021-05-13 MED ORDER — ENOXAPARIN SODIUM 30 MG/0.3ML IJ SOSY
30.0000 mg | PREFILLED_SYRINGE | INTRAMUSCULAR | Status: DC
  Administered 2021-05-14 – 2021-05-16 (×3): 30 mg via SUBCUTANEOUS
  Filled 2021-05-13 (×3): qty 0.3

## 2021-05-13 MED ORDER — METHYLPREDNISOLONE SODIUM SUCC 40 MG IJ SOLR
40.0000 mg | Freq: Two times a day (BID) | INTRAMUSCULAR | Status: DC
  Administered 2021-05-14 – 2021-05-15 (×3): 40 mg via INTRAVENOUS
  Filled 2021-05-13 (×4): qty 1

## 2021-05-13 MED ORDER — ONDANSETRON HCL 4 MG PO TABS: 4.0000 mg | ORAL_TABLET | Freq: Four times a day (QID) | ORAL | Status: DC | PRN

## 2021-05-13 MED ORDER — ACETAMINOPHEN 650 MG RE SUPP: 650.0000 mg | Freq: Four times a day (QID) | RECTAL | Status: DC | PRN

## 2021-05-13 MED ORDER — IPRATROPIUM-ALBUTEROL 0.5-2.5 (3) MG/3ML IN SOLN
3.0000 mL | Freq: Four times a day (QID) | RESPIRATORY_TRACT | Status: DC
  Administered 2021-05-14 (×4): 3 mL via RESPIRATORY_TRACT
  Filled 2021-05-13 (×2): qty 3
  Filled 2021-05-13: qty 6

## 2021-05-13 MED ORDER — METHYLPREDNISOLONE SODIUM SUCC 125 MG IJ SOLR
125.0000 mg | Freq: Once | INTRAMUSCULAR | Status: AC
  Administered 2021-05-13: 125 mg via INTRAVENOUS
  Filled 2021-05-13: qty 2

## 2021-05-13 MED ORDER — ACETAMINOPHEN 325 MG PO TABS
650.0000 mg | ORAL_TABLET | Freq: Four times a day (QID) | ORAL | Status: DC | PRN
  Administered 2021-05-14 (×2): 650 mg via ORAL
  Filled 2021-05-13 (×3): qty 2

## 2021-05-13 NOTE — ED Notes (Signed)
Pt was 87% on room air. Pt placed on 2lpm via nasal cannula. ?

## 2021-05-13 NOTE — ED Notes (Signed)
[facility] here for transport to ED. ?

## 2021-05-13 NOTE — H&P (Signed)
?History and Physical  ? ? ?Patient: [name] BTD:[id number] DOB: February 21, 1934 ?DOA: 05/13/2021 ?DOS: the patient was seen and examined on 05/13/2021 ?PCP: Alanson Puls, [facility]  ?Patient coming from: Home ? ?Chief Complaint:  ?Chief Complaint  ?Patient presents with  ? Cough  ? ?HPI: [name] is a 87 y.o. female with medical history significant of COPD, congested heart failure and osteoporosis who presented with worsening dyspnea. Initially noted to have cough about 7 days ago that improved with over the counter antitussive agent, but over last 4 days her cough has been more persistent despite over the counter medication. It has been associated with worsening dyspnea on exertion, increase phlegm production and wheezing. Her family has noted patient to be dyspneic at rest and having increase work of breathing. ? ?Her symptoms have been refractive to the use of bronchodilator at home, MDI and nebulizer.  ?Today her daughter took her to urgent care where she was noted to have a oxymetry of 80% on room air and she was referred to the ED for further evaluation.  ? ?Patient walks with a walker and has limited mobility at baseline. No smoking history but possible second hand smoke exposure.  ?  ?Review of Systems: As mentioned in the history of present illness. All other systems reviewed and are negative. ?Past Medical History:  ?Diagnosis Date  ? Anxiety   ? Chronic diastolic (congestive) heart failure (HCC)   ? Chronic obstructive pulmonary disease, unspecified ([place])   ? COPD (chronic obstructive pulmonary disease) ([place])   ? GERD (gastroesophageal reflux disease)   ? Hyperlipidemia   ? IBS (irritable bowel syndrome)   ? Lumbago with sciatica   ? Osteoporosis   ? Overactive bladder   ? Pneumonia   ? ?Past Surgical History:  ?Procedure Laterality Date  ? COLONOSCOPY  10/2009  ? sigmoid diverticula, multiple tubulovillous adneomas, needs surveillance Oct 2013  ? ESOPHAGOGASTRODUODENOSCOPY  04/16/2011  ? Dr. Gala Romney:  Noncritical Schatzkis ring( not manipulated because no dysphagia). Normal esophagus otherwise  large hiatal hernia. Gastric polyp-status post biopsy. Gastric erosions-staus post biopsy. Abnormal bulb-status post biopsy. benign small bowel, stomach biopsy with ulcerated gastric antral mucosa with foveolar hyperplasia and surface erosion, polyp with inflamed gastric antral mucosa   ? ESOPHAGOGASTRODUODENOSCOPY N/A 04/29/2014  ? Dr. Gala Romney: Noncritical Schatzki's ring large hiatal hernia. Retained gastric contents.GES with slight delay  ? HIP PINNING,CANNULATED Right 04/06/2018  ? Procedure: CANNULATED HIP PINNING;  Surgeon: Carole Civil, MD;  Location: [facility];  Service: Orthopedics;  Laterality: Right;  ? ?Social History:  reports that she has quit smoking. Her smoking use included cigarettes. She has been exposed to tobacco smoke. Her smokeless tobacco use includes snuff. She reports that she does not drink alcohol and does not use drugs. ? ?Allergies  ?Allergen Reactions  ? Codeine Shortness Of Breath and Rash  ? Codeine Anaphylaxis  ? Levofloxacin Anaphylaxis  ?  Patient was admitted to hospital with lung problems due to this medication  ? Sulfonamide Derivatives Hives and Shortness Of Breath  ? Sulfa Antibiotics Hives  ? ? ?Family History  ?Problem Relation Age of Onset  ? Stroke Mother   ? Diabetes Mother   ? Heart disease Father   ? Colon cancer Neg Hx   ? ? ?Prior to Admission medications   ?Medication Sig Start Date End Date Taking? Authorizing Provider  ?albuterol (PROAIR HFA) 108 (90 Base) MCG/ACT inhaler Inhale 2 puffs into the lungs every 4 (four) hours as  needed. 06/23/18  Yes Gerlene Fee, NP  ?albuterol (PROVENTIL) (2.5 MG/3ML) 0.083% nebulizer solution Take 3 mLs (2.5 mg total) by nebulization every 6 (six) hours as needed for wheezing or shortness of breath. 06/23/18  Yes Gerlene Fee, NP  ?ALPRAZolam (XANAX) 0.5 MG tablet Take 0.5 mg by mouth 2 (two) times daily.   Yes [provider]  ?cetirizine (ZYRTEC) 10 MG tablet Take 10 mg by mouth daily as needed for allergies. 06/16/18  Yes [provider]  ?dextromethorphan-guaiFENesin (MUCINEX DM) 30-600 MG 12hr tablet Take 1 tablet by mouth 2 (two) times daily.   Yes [provider]  ?diclofenac Sodium (VOLTAREN) 1 % GEL APPLY AS DIRECTED TO THE AFFECTED KNEE FOUR TIMES DAILY. ?Patient taking differently: Apply 4 g topically 4 (four) times daily. 08/04/20  Yes Carole Civil, MD  ?donepezil (ARICEPT) 10 MG tablet Take 10 mg by mouth daily. 07/25/19  Yes [provider]  ?furosemide (LASIX) 40 MG tablet Take 40 mg by mouth daily.   Yes [provider]  ?memantine (NAMENDA) 10 MG tablet Take 10 mg by mouth 2 (two) times daily.   Yes [provider]  ?Multiple Vitamin (MULTIVITAMIN WITH MINERALS) TABS tablet Take 1 tablet by mouth daily.   Yes [provider]  ?pantoprazole (PROTONIX) 40 MG tablet TAKE (1) TABLET BY MOUTH TWICE A DAY WITH MEALS (BREAKFAST AND SUPPER) ?Patient taking differently: Take 40 mg by mouth daily. 05/24/19  Yes Carlis Stable, NP  ?potassium chloride (K-DUR) 10 MEQ tablet Take 1 tablet (10 mEq total) by mouth daily. 06/23/18  Yes Gerlene Fee, NP  ?simvastatin (ZOCOR) 10 MG tablet Take 5 mg by mouth daily. 02/27/21  Yes [provider]  ?traMADol (ULTRAM) 50 MG tablet Take 100 mg by mouth every 6 (six) hours as needed for moderate pain.   Yes [provider]  ?umeclidinium-vilanterol (ANORO ELLIPTA) 62.5-25 MCG/INH AEPB Inhale 1 puff into the lungs daily. 06/23/18  Yes Gerlene Fee, NP  ?zolpidem (AMBIEN) 5 MG tablet Take 1 tablet (5 mg total) by mouth at bedtime as needed for up to 13 days for sleep. X 14 days ?Patient taking differently: Take 5 mg by mouth at bedtime. 06/23/18 05/13/21 Yes Gerlene Fee, NP  ?HYDROcodone-acetaminophen (NORCO/VICODIN) 5-325 MG tablet Take 1 tablet by mouth every 6 (six) hours as needed for moderate pain. ?Patient not  taking: Reported on 08/19/2020 11/15/19   Carole Civil, MD  ?lidocaine (XYLOCAINE) 5 % ointment Apply 1 application topically 4 (four) times daily as needed. ?Patient not taking: Reported on 08/19/2020 11/10/19   Margette Fast, MD  ?linaclotide Rolan Lipa) 145 MCG CAPS capsule Take 1 capsule (145 mcg total) by mouth daily as needed. ?Patient not taking: Reported on 05/13/2021 06/23/18   Gerlene Fee, NP  ?meloxicam (MOBIC) 7.5 MG tablet TAKE 1 TABLET BY MOUTH TWICE DAILY ?Patient not taking: Reported on 08/19/2020 06/20/19   Carole Civil, MD  ?methocarbamol (ROBAXIN) 500 MG tablet Take 1 tablet (500 mg total) by mouth every 8 (eight) hours as needed (muscle spasm/pain). ?Patient not taking: Reported on 05/13/2021 03/14/21   Lajean Saver, MD  ?tamsulosin (FLOMAX) 0.4 MG CAPS capsule Take 1 capsule (0.4 mg total) by mouth daily. ?Patient not taking: Reported on 08/19/2020 06/23/18   Gerlene Fee, NP  ?tiZANidine (ZANAFLEX) 4 MG tablet TAKE 1 TABLET BY MOUTH ONCE DAILY. ?Patient not taking: Reported on 08/19/2020 12/11/19   Carole Civil, MD  ?  traMADol (ULTRAM) 50 MG tablet Take 1 tablet (50 mg total) by mouth every 6 (six) hours as needed (pain). ?Patient not taking: Reported on 05/13/2021 07/04/18   [name], MD  ? ? ?Physical Exam: ?Vitals:  ? 05/13/21 2225 05/13/21 2230 05/13/21 2309 05/13/21 2311  ?BP:  124/75 (!) 148/91   ?Pulse:  80 93   ?Resp:  20 17   ?Temp:   98.5 ?F (36.9 ?C)   ?TempSrc:      ?SpO2: 97% 100% 98%   ?Weight:    48.8 kg  ?Height:      ? ?Neurology awake and alert  ?ENT with mild pallor ?Cardiovascular with S1 and S2 present and rhythmic, no gallops, rubs or murmurs ?No JVD  ?Trace non pitting lower extremity edema ?Respiratory with a prolonged expiratory phase, positive rhonchi and wheezing, no rales ?Abdomen soft and not distended ?Musculoskeletal with prominent kyphosis.  ?Data Reviewed: ? ? ?87 yo female with COPD and heart failure, decreased mobility and sever kyphosis  who presented with worsening dyspnea for the last 4 days, with dyspnea on exertion, increase phlegm production, and wheezing. Today positive hypoxemia. On her initial physical examination her RR is 20 a

## 2021-05-13 NOTE — ED Notes (Signed)
Patient is being discharged from the Urgent Care and sent to the Emergency Department via [facility] . Per PS, patient is in need of higher level of care due to low O2 Sat, cough, SOB on exertion. Patient is aware and verbalizes understanding of plan of care.  ?Vitals:  ? 05/13/21 1927 05/13/21 1938  ?BP: 122/79   ?Pulse: 89   ?Resp: 20   ?Temp: 98.5 ?F (36.9 ?C)   ?SpO2: (!) 88% 93%  ? ? ?

## 2021-05-13 NOTE — ED Provider Notes (Signed)
?[place] ? ? ? ?CSN: [id number] ?Arrival date & time: 05/13/21  1919 ? ? ?  ? ?History   ?Chief Complaint ?No chief complaint on file. ? ? ?HPI ?[name] is a 87 y.o. female.  ? ?Patient presenting today with about 1 week history of progressively worsening cough, wheezing, shortness of breath, congestion, fatigue.  States she has been checking her home oxygen and it has been staying around 95% but today dropped down below 92% on room air.  Has been taking Mucinex, Alka-Seltzer cold and sinus with initially some benefit but no longer having any benefit with this.  She does have a history of COPD on Anoro, albuterol, nebulizer treatments about 4 times a day.  She also has a history of CHF, compliant with Lasix and no significant edema at this time. ? ? ?Past Medical History:  ?Diagnosis Date  ? Anxiety   ? Chronic diastolic (congestive) heart failure (HCC)   ? Chronic obstructive pulmonary disease, unspecified ([place])   ? COPD (chronic obstructive pulmonary disease) ([place])   ? GERD (gastroesophageal reflux disease)   ? Hyperlipidemia   ? IBS (irritable bowel syndrome)   ? Lumbago with sciatica   ? Osteoporosis   ? Overactive bladder   ? Pneumonia   ? ? ?Patient Active Problem List  ? Diagnosis Date Noted  ? Chronic pain 04/23/2019  ? General unsteadiness 04/23/2019  ? Polyarthropathy 04/23/2019  ? Pseudobulbar affect 04/23/2019  ? Tremor 04/23/2019  ? Chronic obstructive pulmonary disease, unspecified ([place])   ? Gastro-esophageal reflux disease without esophagitis   ? Hyperlipidemia, unspecified   ? Pulmonary fibrosis, unspecified ([place])   ? Acute on chronic diastolic (congestive) heart failure ([place])   ? Age-related osteoporosis without current pathological fracture   ? Anxiety disorder, unspecified   ? Chest pain, unspecified   ? Chronic diastolic (congestive) heart failure (HCC)   ? Collapsed vertebra, not elsewhere classified, site unspecified, subsequent encounter for fracture with routine healing    ? Edema, unspecified   ? Lumbago with sciatica   ? Overactive bladder   ? Pain in unspecified hand   ? Pressure ulcer of unspecified site, unspecified stage   ? Unspecified protein-calorie malnutrition ([place])   ? Acute diastolic heart failure ([place]) 09/30/2018  ? Dyspnea 09/29/2018  ? Pneumonia 06/30/2018  ? Acute CHF (congestive heart failure) ([place]) 06/29/2018  ? Vascular dementia without behavioral disturbance ([place]) 06/26/2018  ? CAP (community acquired pneumonia) 06/23/2018  ? Bilateral pleural effusion 06/23/2018  ? Leukocytosis 06/20/2018  ? Chronic anemia 06/20/2018  ? Cognitive impairment 06/19/2018  ? Bilateral lower extremity edema 06/18/2018  ? Protein-calorie malnutrition, severe ([place]) 06/18/2018  ? S/P internal fixation right hip fracture cannulated screw placement 04/06/18 04/27/2018  ? Urinary tract infection associated with catheterization of urinary tract ([place]) 04/11/2018  ? Altered mental status 04/10/2018  ? Anxiety 04/10/2018  ? Hyponatremia 04/10/2018  ? UTI (urinary tract infection) due to urinary indwelling catheter ([place]) 04/10/2018  ? Chronic constipation 04/09/2018  ? Urinary retention 04/09/2018  ? Closed right hip fracture ([place]) 04/05/2018  ? GERD (gastroesophageal reflux disease) 04/05/2018  ? COPD with acute exacerbation ([place]) 04/05/2018  ? Hiatal hernia   ? Dyspepsia 04/04/2014  ? Hematochezia 04/30/2011  ? RUQ pain 03/25/2011  ? Adenomatous polyps 03/25/2011  ? ABDOMINAL PAIN, LEFT LOWER QUADRANT 10/16/2009  ? HYPERLIPIDEMIA 10/09/2009  ? BRONCHITIS 10/09/2009  ? Osteoporosis 10/09/2009  ? ? ?Past Surgical History:  ?Procedure Laterality Date  ? COLONOSCOPY  10/2009  ? sigmoid diverticula, multiple tubulovillous adneomas, needs surveillance Oct 2013  ? ESOPHAGOGASTRODUODENOSCOPY  04/16/2011  ? Dr. Gala Romney: Noncritical Schatzkis ring( not manipulated because no dysphagia). Normal esophagus otherwise  large hiatal hernia. Gastric polyp-status post biopsy. Gastric erosions-staus post biopsy.  Abnormal bulb-status post biopsy. benign small bowel, stomach biopsy with ulcerated gastric antral mucosa with foveolar hyperplasia and surface erosion, polyp with inflamed gastric antral mucosa   ? ESOPHAGOGASTRODUODENOSCOPY N/A 04/29/2014  ? Dr. Gala Romney: Noncritical Schatzki's ring large hiatal hernia. Retained gastric contents.GES with slight delay  ? HIP PINNING,CANNULATED Right 04/06/2018  ? Procedure: CANNULATED HIP PINNING;  Surgeon: Carole Civil, MD;  Location: [facility];  Service: Orthopedics;  Laterality: Right;  ? ? ?OB History   ? ? Gravida  ?3  ? Para  ?3  ? Term  ?3  ? Preterm  ?0  ? AB  ?0  ? Living  ?   ?  ? ? SAB  ?0  ? IAB  ?0  ? Ectopic  ?0  ? Multiple  ?   ? Live Births  ?   ?   ?  ?  ? ? ? ?Home Medications   ? ?Prior to Admission medications   ?Medication Sig Start Date End Date Taking? Authorizing Provider  ?acetaminophen (TYLENOL) 500 MG tablet Take 500 mg by mouth daily. ?Patient not taking: Reported on 08/19/2020    [provider]  ?acetaminophen (TYLENOL) 650 MG CR tablet Take 650 mg by mouth every 8 (eight) hours as needed for pain.    [provider]  ?albuterol (PROAIR HFA) 108 (90 Base) MCG/ACT inhaler Inhale 2 puffs into the lungs every 4 (four) hours as needed. 06/23/18   Gerlene Fee, NP  ?albuterol (PROVENTIL) (2.5 MG/3ML) 0.083% nebulizer solution Take 3 mLs (2.5 mg total) by nebulization every 6 (six) hours as needed for wheezing or shortness of breath. 06/23/18   Gerlene Fee, NP  ?ALPRAZolam (XANAX) 0.5 MG tablet Take 0.5 mg by mouth 2 (two) times daily.    [provider]  ?cetirizine (ZYRTEC) 10 MG tablet Take 10 mg by mouth daily as needed for allergies. 06/16/18   [provider]  ?diclofenac sodium (VOLTAREN) 1 % GEL Apply 2 g topically 2 (two) times daily.    [provider]  ?diclofenac Sodium (VOLTAREN) 1 % GEL APPLY AS DIRECTED TO THE AFFECTED KNEE FOUR TIMES DAILY. ?Patient not taking: Reported on 08/19/2020 08/04/20    Carole Civil, MD  ?donepezil (ARICEPT) 10 MG tablet Take 10 mg by mouth daily. 07/25/19   [provider]  ?furosemide (LASIX) 40 MG tablet Take 40 mg by mouth daily.    [provider]  ?HYDROcodone-acetaminophen (NORCO/VICODIN) 5-325 MG tablet Take 1 tablet by mouth every 6 (six) hours as needed for moderate pain. ?Patient not taking: Reported on 08/19/2020 11/15/19   Carole Civil, MD  ?lidocaine (XYLOCAINE) 5 % ointment Apply 1 application topically 4 (four) times daily as needed. ?Patient not taking: Reported on 08/19/2020 11/10/19   Margette Fast, MD  ?linaclotide Rolan Lipa) 145 MCG CAPS capsule Take 1 capsule (145 mcg total) by mouth daily as needed. ?Patient taking differently: Take 145 mcg by mouth daily as needed (for constipation). 06/23/18   Gerlene Fee, NP  ?meloxicam (MOBIC) 7.5 MG tablet TAKE 1 TABLET BY MOUTH TWICE DAILY ?Patient not taking: Reported on 08/19/2020 06/20/19   Carole Civil, MD  ?memantine (NAMENDA) 10 MG tablet Take 10 mg  by mouth 2 (two) times daily.    [provider]  ?memantine (NAMENDA) 5 MG tablet Take 5 mg by mouth 2 (two) times daily. ?Patient not taking: Reported on 12/25/2020 08/02/20   [provider]  ?methocarbamol (ROBAXIN) 500 MG tablet Take 1 tablet (500 mg total) by mouth every 8 (eight) hours as needed (muscle spasm/pain). 03/14/21   Lajean Saver, MD  ?Multiple Vitamin (MULTIVITAMIN WITH MINERALS) TABS tablet Take 1 tablet by mouth daily.    [provider]  ?nitrofurantoin, macrocrystal-monohydrate, (MACROBID) 100 MG capsule Take 100 mg by mouth 2 (two) times daily. 12/22/20   [provider]  ?pantoprazole (PROTONIX) 40 MG tablet TAKE (1) TABLET BY MOUTH TWICE A DAY WITH MEALS (BREAKFAST AND SUPPER) ?Patient taking differently: Take 40 mg by mouth 2 (two) times daily before a meal. 05/24/19   Carlis Stable, NP  ?potassium chloride (K-DUR) 10 MEQ tablet Take 1 tablet (10 mEq total) by mouth daily.  06/23/18   Gerlene Fee, NP  ?tamsulosin (FLOMAX) 0.4 MG CAPS capsule Take 1 capsule (0.4 mg total) by mouth daily. ?Patient not taking: Reported on 08/19/2020 06/23/18   Gerlene Fee, NP  ?tiZANidine (Z

## 2021-05-13 NOTE — ED Triage Notes (Signed)
Pt arrives [facility] from [facility] pt c/o cough for the past 2 weeks. Pt found to have O2 sats of 88% on RA. Only placed on 2L [place] by [facility] and EMS called for transport. Pt denies SOB. ?

## 2021-05-13 NOTE — ED Triage Notes (Signed)
Cough productive at times and wheezing.  SOB upon exertion.  Has been taking mucinex DM with -out relief ?

## 2021-05-13 NOTE — ED Provider Notes (Signed)
[facility] EMERGENCY DEPARTMENT Provider Note   CSN: [id number] Arrival date & time: 05/13/21  2000     History  Chief Complaint  Patient presents with   Cough    [name] is a 87 y.o. female.   Cough  Patient with medical history of COPD, hyperlipidemia, CHF presents today due to shortness of breath.  She was seen at urgent care prior to arriving and found to be hypoxic, sent to ED for further evaluation.  Patient states she has been feeling short of breath for "a while", is worsened in the last week.  She has been coughing but denies any chest pain.  Not on oxygen at baseline.  No fevers at home, ambulation makes the shortness of breath worse.  She has tried nebulizer treatments and rescue inhaler without any relief of her symptoms. Does not currently smoke.   Home Medications Prior to Admission medications   Medication Sig Start Date End Date Taking? Authorizing Provider  albuterol (PROAIR HFA) 108 (90 Base) MCG/ACT inhaler Inhale 2 puffs into the lungs every 4 (four) hours as needed. 06/23/18  Yes Gerlene Fee, NP  albuterol (PROVENTIL) (2.5 MG/3ML) 0.083% nebulizer solution Take 3 mLs (2.5 mg total) by nebulization every 6 (six) hours as needed for wheezing or shortness of breath. 06/23/18  Yes Gerlene Fee, NP  ALPRAZolam Duanne Moron) 0.5 MG tablet Take 0.5 mg by mouth 2 (two) times daily.   Yes [provider]  cetirizine (ZYRTEC) 10 MG tablet Take 10 mg by mouth daily as needed for allergies. 06/16/18  Yes [provider]  dextromethorphan-guaiFENesin (MUCINEX DM) 30-600 MG 12hr tablet Take 1 tablet by mouth 2 (two) times daily.   Yes [provider]  diclofenac Sodium (VOLTAREN) 1 % GEL APPLY AS DIRECTED TO THE AFFECTED KNEE FOUR TIMES DAILY. Patient taking differently: Apply 4 g topically 4 (four) times daily. 08/04/20  Yes Carole Civil, MD  donepezil (ARICEPT) 10 MG tablet Take 10 mg by mouth daily. 07/25/19  Yes [provider]   furosemide (LASIX) 40 MG tablet Take 40 mg by mouth daily.   Yes [provider]  memantine (NAMENDA) 10 MG tablet Take 10 mg by mouth 2 (two) times daily.   Yes [provider]  Multiple Vitamin (MULTIVITAMIN WITH MINERALS) TABS tablet Take 1 tablet by mouth daily.   Yes [provider]  pantoprazole (PROTONIX) 40 MG tablet TAKE (1) TABLET BY MOUTH TWICE A DAY WITH MEALS (BREAKFAST AND SUPPER) Patient taking differently: Take 40 mg by mouth daily. 05/24/19  Yes Carlis Stable, NP  potassium chloride (K-DUR) 10 MEQ tablet Take 1 tablet (10 mEq total) by mouth daily. 06/23/18  Yes Gerlene Fee, NP  simvastatin (ZOCOR) 10 MG tablet Take 5 mg by mouth daily. 02/27/21  Yes [provider]  traMADol (ULTRAM) 50 MG tablet Take 100 mg by mouth every 6 (six) hours as needed for moderate pain.   Yes [provider]  umeclidinium-vilanterol (ANORO ELLIPTA) 62.5-25 MCG/INH AEPB Inhale 1 puff into the lungs daily. 06/23/18  Yes Gerlene Fee, NP  zolpidem (AMBIEN) 5 MG tablet Take 1 tablet (5 mg total) by mouth at bedtime as needed for up to 13 days for sleep. X 14 days Patient taking differently: Take 5 mg by mouth at bedtime. 06/23/18 05/13/21 Yes Gerlene Fee, NP  HYDROcodone-acetaminophen (NORCO/VICODIN) 5-325 MG tablet Take 1 tablet by mouth every 6 (six) hours as needed for moderate pain. Patient not  taking: Reported on 08/19/2020 11/15/19   Carole Civil, MD  lidocaine (XYLOCAINE) 5 % ointment Apply 1 application topically 4 (four) times daily as needed. Patient not taking: Reported on 08/19/2020 11/10/19   Margette Fast, MD  linaclotide [facility]) 145 MCG CAPS capsule Take 1 capsule (145 mcg total) by mouth daily as needed. Patient not taking: Reported on 05/13/2021 06/23/18   Gerlene Fee, NP  meloxicam (MOBIC) 7.5 MG tablet TAKE 1 TABLET BY MOUTH TWICE DAILY Patient not taking: Reported on 08/19/2020 06/20/19   Carole Civil, MD   methocarbamol (ROBAXIN) 500 MG tablet Take 1 tablet (500 mg total) by mouth every 8 (eight) hours as needed (muscle spasm/pain). Patient not taking: Reported on 05/13/2021 03/14/21   Lajean Saver, MD  tamsulosin (FLOMAX) 0.4 MG CAPS capsule Take 1 capsule (0.4 mg total) by mouth daily. Patient not taking: Reported on 08/19/2020 06/23/18   Gerlene Fee, NP  tiZANidine (ZANAFLEX) 4 MG tablet TAKE 1 TABLET BY MOUTH ONCE DAILY. Patient not taking: Reported on 08/19/2020 12/11/19   Carole Civil, MD  traMADol (ULTRAM) 50 MG tablet Take 1 tablet (50 mg total) by mouth every 6 (six) hours as needed (pain). Patient not taking: Reported on 05/13/2021 07/04/18   [name], MD      Allergies    Codeine, Codeine, Levofloxacin, Sulfonamide derivatives, and Sulfa antibiotics    Review of Systems   Review of Systems  Respiratory:  Positive for cough.    Physical Exam Updated Vital Signs BP 124/75   Pulse 80   Temp 98.5 F (36.9 C) (Oral)   Resp 20   Ht 5' (1.524 m)   Wt 49.9 kg   SpO2 100%   BMI 21.48 kg/m  Physical Exam Vitals and nursing note reviewed. Exam conducted with a chaperone present.  Constitutional:      General: She is not in acute distress.    Appearance: Normal appearance.     Comments: Frail  HENT:     Head: Normocephalic and atraumatic.  Eyes:     General: No scleral icterus.    Extraocular Movements: Extraocular movements intact.     Pupils: Pupils are equal, round, and reactive to light.  Cardiovascular:     Rate and Rhythm: Normal rate and regular rhythm.  Pulmonary:     Effort: Tachypnea present.     Breath sounds: No decreased air movement. Decreased breath sounds and wheezing present.  Musculoskeletal:     Right lower leg: No edema.     Left lower leg: No edema.  Skin:    Coloration: Skin is not jaundiced.  Neurological:     Mental Status: She is alert. Mental status is at baseline.     Coordination: Coordination normal.    ED Results /  Procedures / Treatments   Labs (all labs ordered are listed, but only abnormal results are displayed) Labs Reviewed  BASIC METABOLIC PANEL - Abnormal; Notable for the following components:      Result Value   Chloride 97 (*)    Creatinine, Ser 1.17 (*)    GFR, Estimated 46 (*)    All other components within normal limits  CBC WITH DIFFERENTIAL/PLATELET - Abnormal; Notable for the following components:   Hemoglobin 11.0 (*)    HCT 34.7 (*)    Eosinophils Absolute 0.6 (*)    All other components within normal limits  BRAIN NATRIURETIC PEPTIDE  TROPONIN I (HIGH SENSITIVITY)  TROPONIN I (HIGH SENSITIVITY)  EKG None  Radiology DG Chest 2 View  Result Date: 05/13/2021 CLINICAL DATA:  Shortness of breath EXAM: CHEST - 2 VIEW COMPARISON:  03/14/2021, 10/18/2018, CT 06/29/2018 FINDINGS: Diffuse bilateral reticular opacity consistent with chronic lung disease. Small pleural effusions or pleural thickening. No consolidation. Stable cardiomediastinal silhouette with large hiatal hernia. Kyphosis of the spine with multiple chronic compression fractures and osteopenia. IMPRESSION: Evidence for chronic lung disease without acute airspace disease. Large hiatal hernia Electronically Signed   By: Donavan Foil M.D.   On: 05/13/2021 21:24    Procedures Procedures    Medications Ordered in ED Medications  ipratropium-albuterol (DUONEB) 0.5-2.5 (3) MG/3ML nebulizer solution 3 mL (3 mLs Nebulization Given 05/13/21 2036)  methylPREDNISolone sodium succinate (SOLU-MEDROL) 125 mg/2 mL injection 125 mg (125 mg Intravenous Given 05/13/21 2034)  ipratropium-albuterol (DUONEB) 0.5-2.5 (3) MG/3ML nebulizer solution 3 mL (3 mLs Nebulization Given 05/13/21 2224)    ED Course/ Medical Decision Making/ A&P                           Medical Decision Making Amount and/or Complexity of Data Reviewed Labs: ordered. Radiology: ordered.  Risk Prescription drug management. Decision regarding  hospitalization.   This patient presents to the ED for concern of SOB, this involves an extensive number of treatment options, and is a complaint that carries with it a high risk of complications and morbidity.  The differential diagnosis includes acute respiratory failure, CHF, COPD exacerbation, ACS, PE   Additional history obtained:   Reviewed external records, patient has a history of COPD.  Typically is on room air.   Lab Tests:  I ordered, viewed, and personally interpreted labs.  The pertinent results include: No leukocytosis, stable anemia with a hemoglobin of 11.  BMP within normal limits.  Broponin low at 3.    Imaging Studies ordered:  I directly visualized the CXR, which showed no acute process  I agree with the radiologist interpretation    ECG/Cardiac monitoring:   Per my interpretation, EKG shows NSR  The patient was maintained on a cardiac monitor.  Visualized monitor strip which showed NSR per my interpretation.    Medicines ordered and prescription drug management:  I ordered medication including: duoneb, solumedrol    I have reviewed the patients home medicines and have made adjustments as needed    Consultations Obtained:  I requested consultation with the hospitalist.  Discussed lab and imaging findings as well as pertinent plan - they recommend: Dr. Cathlean Sauer agrees with admission. Appreciate his consult   Reevaluation:  After the interventions noted above, I reevaluated the patient and found breathing improved but still hypoxic without that she liters   Problems addressed / ED Course: Admission for new hypoxia   Social Determinants of Health:    Disposition:   After consideration of the diagnostic results and the patients response to treatment, I feel that the patent would benefit from admission.            Final Clinical Impression(s) / ED Diagnoses Final diagnoses:  Acute respiratory failure with hypoxia [facility])    Rx / DC  Orders ED Discharge Orders     None         Sherrill Raring, Hershal Coria 05/13/21 2236    Luna Fuse, MD 05/22/21 2245

## 2021-05-13 NOTE — ED Notes (Signed)
Patient states symptoms started 2 weeks ago. ?

## 2021-05-14 DIAGNOSIS — F32A Depression, unspecified: Secondary | ICD-10-CM

## 2021-05-14 DIAGNOSIS — I5032 Chronic diastolic (congestive) heart failure: Secondary | ICD-10-CM | POA: Diagnosis present

## 2021-05-14 DIAGNOSIS — J9601 Acute respiratory failure with hypoxia: Secondary | ICD-10-CM | POA: Diagnosis not present

## 2021-05-14 DIAGNOSIS — E785 Hyperlipidemia, unspecified: Secondary | ICD-10-CM

## 2021-05-14 DIAGNOSIS — J441 Chronic obstructive pulmonary disease with (acute) exacerbation: Secondary | ICD-10-CM | POA: Diagnosis not present

## 2021-05-14 LAB — RESP PANEL BY RT-PCR (FLU A&B, COVID) ARPGX2
Influenza A by PCR: NEGATIVE
Influenza B by PCR: NEGATIVE
SARS Coronavirus 2 by RT PCR: NEGATIVE

## 2021-05-14 LAB — CBC
HCT: 33.2 % — ABNORMAL LOW (ref 36.0–46.0)
Hemoglobin: 10.3 g/dL — ABNORMAL LOW (ref 12.0–15.0)
MCH: 27.7 pg (ref 26.0–34.0)
MCHC: 31 g/dL (ref 30.0–36.0)
MCV: 89.2 fL (ref 80.0–100.0)
Platelets: 284 10*3/uL (ref 150–400)
RBC: 3.72 MIL/uL — ABNORMAL LOW (ref 3.87–5.11)
RDW: 13 % (ref 11.5–15.5)
WBC: 7.9 10*3/uL (ref 4.0–10.5)
nRBC: 0 % (ref 0.0–0.2)

## 2021-05-14 LAB — BASIC METABOLIC PANEL
Anion gap: 7 (ref 5–15)
BUN: 19 mg/dL (ref 8–23)
CO2: 28 mmol/L (ref 22–32)
Calcium: 8.9 mg/dL (ref 8.9–10.3)
Chloride: 100 mmol/L (ref 98–111)
Creatinine, Ser: 1 mg/dL (ref 0.44–1.00)
GFR, Estimated: 56 mL/min — ABNORMAL LOW (ref >60)
Glucose, Bld: 151 mg/dL — ABNORMAL HIGH (ref 70–99)
Potassium: 5 mmol/L (ref 3.5–5.1)
Sodium: 135 mmol/L (ref 135–145)

## 2021-05-14 MED ORDER — TRAMADOL HCL 50 MG PO TABS
100.0000 mg | ORAL_TABLET | Freq: Two times a day (BID) | ORAL | Status: DC | PRN
  Administered 2021-05-14 – 2021-05-15 (×3): 100 mg via ORAL
  Filled 2021-05-14 (×5): qty 2

## 2021-05-14 MED ORDER — IPRATROPIUM-ALBUTEROL 0.5-2.5 (3) MG/3ML IN SOLN
3.0000 mL | Freq: Three times a day (TID) | RESPIRATORY_TRACT | Status: DC
  Administered 2021-05-15 (×3): 3 mL via RESPIRATORY_TRACT
  Filled 2021-05-14: qty 3
  Filled 2021-05-14: qty 6

## 2021-05-14 MED ORDER — POTASSIUM CHLORIDE CRYS ER 10 MEQ PO TBCR
10.0000 meq | EXTENDED_RELEASE_TABLET | Freq: Every day | ORAL | Status: DC
  Administered 2021-05-14 – 2021-05-16 (×3): 10 meq via ORAL
  Filled 2021-05-14 (×5): qty 1

## 2021-05-14 MED ORDER — ZOLPIDEM TARTRATE 5 MG PO TABS
5.0000 mg | ORAL_TABLET | Freq: Every day | ORAL | Status: DC
  Administered 2021-05-14 – 2021-05-15 (×3): 5 mg via ORAL
  Filled 2021-05-14 (×3): qty 1

## 2021-05-14 MED ORDER — FUROSEMIDE 40 MG PO TABS
40.0000 mg | ORAL_TABLET | Freq: Every day | ORAL | Status: DC
  Administered 2021-05-14 – 2021-05-16 (×3): 40 mg via ORAL
  Filled 2021-05-14 (×3): qty 1

## 2021-05-14 MED ORDER — DOXYCYCLINE HYCLATE 100 MG PO TABS
100.0000 mg | ORAL_TABLET | Freq: Two times a day (BID) | ORAL | Status: DC
  Administered 2021-05-14 – 2021-05-16 (×5): 100 mg via ORAL
  Filled 2021-05-14 (×5): qty 1

## 2021-05-14 MED ORDER — MEMANTINE HCL 10 MG PO TABS
10.0000 mg | ORAL_TABLET | Freq: Two times a day (BID) | ORAL | Status: DC
  Administered 2021-05-14 – 2021-05-16 (×6): 10 mg via ORAL
  Filled 2021-05-14 (×6): qty 1

## 2021-05-14 MED ORDER — ADULT MULTIVITAMIN W/MINERALS CH
1.0000 | ORAL_TABLET | Freq: Every day | ORAL | Status: DC
  Administered 2021-05-14 – 2021-05-16 (×3): 1 via ORAL
  Filled 2021-05-14 (×3): qty 1

## 2021-05-14 MED ORDER — PANTOPRAZOLE SODIUM 40 MG PO TBEC
40.0000 mg | DELAYED_RELEASE_TABLET | Freq: Every day | ORAL | Status: DC
  Administered 2021-05-14 – 2021-05-16 (×3): 40 mg via ORAL
  Filled 2021-05-14 (×3): qty 1

## 2021-05-14 MED ORDER — LORATADINE 10 MG PO TABS
10.0000 mg | ORAL_TABLET | Freq: Every day | ORAL | Status: DC
  Administered 2021-05-14 – 2021-05-16 (×3): 10 mg via ORAL
  Filled 2021-05-14 (×3): qty 1

## 2021-05-14 MED ORDER — MOMETASONE FURO-FORMOTEROL FUM 200-5 MCG/ACT IN AERO
2.0000 | INHALATION_SPRAY | Freq: Two times a day (BID) | RESPIRATORY_TRACT | Status: DC
  Administered 2021-05-14 – 2021-05-16 (×5): 2 via RESPIRATORY_TRACT
  Filled 2021-05-14: qty 8.8

## 2021-05-14 MED ORDER — DONEPEZIL HCL 5 MG PO TABS
10.0000 mg | ORAL_TABLET | Freq: Every day | ORAL | Status: DC
  Administered 2021-05-14 – 2021-05-16 (×3): 10 mg via ORAL
  Filled 2021-05-14 (×3): qty 2

## 2021-05-14 MED ORDER — ALPRAZOLAM 0.5 MG PO TABS
0.5000 mg | ORAL_TABLET | Freq: Two times a day (BID) | ORAL | Status: DC
  Administered 2021-05-14 – 2021-05-16 (×6): 0.5 mg via ORAL
  Filled 2021-05-14 (×6): qty 1

## 2021-05-14 MED ORDER — SIMVASTATIN 10 MG PO TABS
5.0000 mg | ORAL_TABLET | Freq: Every day | ORAL | Status: DC
  Administered 2021-05-14 – 2021-05-16 (×3): 5 mg via ORAL
  Filled 2021-05-14 (×3): qty 1

## 2021-05-14 NOTE — Assessment & Plan Note (Signed)
No clinical sings of decompensated heart failure ? ?Plan to continue diuresis with furosemide and close blood pressure monitoring.  ?Echocardiogram from 2020 with preserved LV systolic function and preserved RV systolic function. (old records personally reviewed).  ?

## 2021-05-14 NOTE — Progress Notes (Signed)
? ?[facility] ?PROGRESS NOTE ? ? ?[name] TGG:[id number] DOB: May 07, 1934 DOA: 05/13/2021 ? ?PCP: Alanson Puls, [facility] ? ?Brief History/Interval Summary: 87 y.o. female with medical history significant of COPD, congested heart failure and osteoporosis who presented with worsening dyspnea.  Started off with a cough for about 7 days prior to admission.  Also noticed wheezing.  She was noted to have saturations of 80% on room air when she was evaluated in the urgent care center.  She was sent over to the emergency department and was hospitalized.   ? ?Consultants: None ? ?Procedures: None ? ? ? ?Subjective/Interval History: ?Patient mentions that she is feeling slightly better today compared to yesterday.  Still short of breath.  No chest pain.  No nausea vomiting.  Still wheezing.  Dry cough. ? ? ?Assessment/Plan: ? ?COPD with acute exacerbation/acute respiratory failure with hypoxia ?Saturations were 80% on room air at the urgent care center ?Patient was wheezing profusely when she was hospitalized.  She was started on nebulizer treatments, steroids.  Will initiate doxycycline as well.  Currently on oxygen by nasal cannula at 2 L/min.  Will need home O2 assessment prior to discharge. ?COVID-19 and influenza PCR's were negative. ? ?Chronic diastolic CHF ?No clinical signs of decompensated heart failure.  Echocardiogram from 2020 showed preserved left ventricular systolic function.  Patient being continued on her furosemide. ? ?History of depression and dementia ?Noted to be on Aricept, memantine.  Also noted to be on alprazolam.  Noted to be on Ambien.  Need to be cautious with these medications in elderly patients. ? ?Hyperlipidemia ?Continue with statin therapy. ? ?Normocytic anemia ?No evidence of overt bleeding.  Continue to monitor closely. ? ? ?DVT Prophylaxis: Lovenox ?Code Status: DNR ?Family Communication: No family at bedside.  Discussed with patient ?Disposition Plan: Hopefully return home  when improved.  Needs PT and OT evaluation ? ?Status is: Inpatient ?Remains inpatient appropriate because: COPD exacerbation ? ? ? ? ? ?Medications: Scheduled: ? ALPRAZolam  0.5 mg Oral BID  ? donepezil  10 mg Oral Daily  ? enoxaparin (LOVENOX) injection  30 mg Subcutaneous Q24H  ? furosemide  40 mg Oral Daily  ? guaiFENesin-dextromethorphan  5 mL Oral QID  ? ipratropium-albuterol  3 mL Nebulization Q6H  ? loratadine  10 mg Oral Daily  ? memantine  10 mg Oral BID  ? methylPREDNISolone (SOLU-MEDROL) injection  40 mg Intravenous Q12H  ? mometasone-formoterol  2 puff Inhalation BID  ? multivitamin with minerals  1 tablet Oral Daily  ? pantoprazole  40 mg Oral Daily  ? potassium chloride  10 mEq Oral Daily  ? simvastatin  5 mg Oral Daily  ? zolpidem  5 mg Oral QHS  ? ?Continuous: ?VOJ:[id number] **OR** acetaminophen, ipratropium-albuterol, ondansetron **OR** ondansetron (ZOFRAN) IV, traMADol ? ?Antibiotics: ?Anti-infectives (From admission, onward)  ? ? None  ? ?  ? ? ?Objective: ? ?Vital Signs ? ?Vitals:  ? 05/14/21 0234 05/14/21 0240 05/14/21 0508 05/14/21 0759  ?BP:  118/73 123/73   ?Pulse:  79 76   ?Resp:  18 18   ?Temp:  (!) 97.5 ?F (36.4 ?C) (!) 97.1 ?F (36.2 ?C)   ?TempSrc:      ?SpO2: 94% 100% 99% 98%  ?Weight:      ?Height:      ? ? ?Intake/Output Summary (Last 24 hours) at 05/14/2021 1003 ?Last data filed at 05/14/2021 0700 ?Gross per 24 hour  ?Intake 840 ml  ?Output 0 ml  ?Net 840 ml  ? ?  Filed Weights  ? 05/13/21 2009 05/13/21 2311  ?Weight: 49.9 kg 48.8 kg  ? ? ?General appearance: Awake alert.  In no distress ?Resp: Mildly tachypneic.  No use of accessory muscles.  No wheezing heard bilaterally.  Few crackles at the bases.  No rhonchi. ?Cardio: S1-S2 is normal regular.  No S3-S4.  No rubs murmurs or bruit ?GI: Abdomen is soft.  Nontender nondistended.  Bowel sounds are present normal.  No masses organomegaly ?Extremities: No edema.  Moving all of her extremities ?Neurologic: .  No focal neurological  deficits.  ? ? ?Lab Results: ? ?Data Reviewed: I have personally reviewed following labs and reports of the imaging studies ? ?CBC: ?Recent Labs  ?Lab 05/13/21 ?2022 05/14/21 ?0444  ?WBC 8.5 7.9  ?NEUTROABS 5.4  --   ?HGB 11.0* 10.3*  ?HCT 34.7* 33.2*  ?MCV 89.4 89.2  ?PLT 305 284  ? ? ?Basic Metabolic Panel: ?Recent Labs  ?Lab 05/13/21 ?2022 05/14/21 ?0444  ?NA 136 135  ?K 4.2 5.0  ?CL 97* 100  ?CO2 30 28  ?GLUCOSE 88 151*  ?BUN 19 19  ?CREATININE 1.17* 1.00  ?CALCIUM 9.0 8.9  ? ? ?GFR: ?Estimated Creatinine Clearance: 30.1 mL/min (by C-G formula based on SCr of 1 mg/dL). ? ? ?Recent Results (from the past 240 hour(s))  ?Resp Panel by RT-PCR (Flu A&B, Covid) Nasopharyngeal Swab     Status: None  ? Collection Time: 05/13/21 11:45 PM  ? Specimen: Nasopharyngeal Swab; Nasopharyngeal(NP) swabs in vial transport medium  ?Result Value Ref Range Status  ? SARS Coronavirus 2 by RT PCR NEGATIVE NEGATIVE Final  ?  Comment: (NOTE) ?SARS-CoV-2 target nucleic acids are NOT DETECTED. ? ?The SARS-CoV-2 RNA is generally detectable in upper respiratory ?specimens during the acute phase of infection. The lowest ?concentration of SARS-CoV-2 viral copies this assay can detect is ?138 copies/mL. A negative result does not preclude SARS-Cov-2 ?infection and should not be used as the sole basis for treatment or ?other patient management decisions. A negative result may occur with  ?improper specimen collection/handling, submission of specimen other ?than nasopharyngeal swab, presence of viral mutation(s) within the ?areas targeted by this assay, and inadequate number of viral ?copies(<138 copies/mL). A negative result must be combined with ?clinical observations, patient history, and epidemiological ?information. The expected result is Negative. ? ?Fact Sheet for Patients:  ?EntrepreneurPulse.com.au ? ?Fact Sheet for Healthcare Providers:  ?IncredibleEmployment.be ? ?This test is no t yet approved or  cleared by the [place] FDA and  ?has been authorized for detection and/or diagnosis of SARS-CoV-2 by ?FDA under an Emergency Use Authorization (EUA). This EUA will remain  ?in effect (meaning this test can be used) for the duration of the ?COVID-19 declaration under Section 564(b)(1) of the Act, 21 ?U.S.C.section 360bbb-3(b)(1), unless the authorization is terminated  ?or revoked sooner.  ? ? ?  ? Influenza A by PCR NEGATIVE NEGATIVE Final  ? Influenza B by PCR NEGATIVE NEGATIVE Final  ?  Comment: (NOTE) ?The Xpert Xpress SARS-CoV-2/FLU/RSV plus assay is intended as an aid ?in the diagnosis of influenza from Nasopharyngeal swab specimens and ?should not be used as a sole basis for treatment. Nasal washings and ?aspirates are unacceptable for Xpert Xpress SARS-CoV-2/FLU/RSV ?testing. ? ?Fact Sheet for Patients: ?EntrepreneurPulse.com.au ? ?Fact Sheet for Healthcare Providers: ?IncredibleEmployment.be ? ?This test is not yet approved or cleared by the [place] FDA and ?has been authorized for detection and/or diagnosis of SARS-CoV-2 by ?FDA under an Emergency Use Authorization (EUA). This  EUA will remain ?in effect (meaning this test can be used) for the duration of the ?COVID-19 declaration under Section 564(b)(1) of the Act, 21 U.S.C. ?section 360bbb-3(b)(1), unless the authorization is terminated or ?revoked. ? ?Performed at [facility], [address], [place], [place] [address] ?  ?  ? ? ?Radiology Studies: ?DG Chest 2 View ? ?Result Date: 05/13/2021 ?CLINICAL DATA:  Shortness of breath EXAM: CHEST - 2 VIEW COMPARISON:  03/14/2021, 10/18/2018, CT 06/29/2018 FINDINGS: Diffuse bilateral reticular opacity consistent with chronic lung disease. Small pleural effusions or pleural thickening. No consolidation. Stable cardiomediastinal silhouette with large hiatal hernia. Kyphosis of the spine with multiple chronic compression fractures and osteopenia. IMPRESSION: Evidence for  chronic lung disease without acute airspace disease. Large hiatal hernia Electronically Signed   By: Donavan Foil M.D.   On: 05/13/2021 21:24   ? ? ? ? LOS: 1 day  ? ?Bonnielee Haff ? ?[facility] ?[place]

## 2021-05-14 NOTE — Assessment & Plan Note (Signed)
Acute hypoxemic respiratory failure. ? ?Plan to continue aggressive bronchodilator therapy with duoneb as need and scheduled.  ?Systemic and inhaled corticosteroids ?Antitussive agents and airway clearing techniques with flutter valve and incentive spirometer.  ? ?Out of bed to chair tid with meals. PT and Ot ?Consult nutrition for evaluation.  ? ?Patient with severe kyphosis, resulting in significant restrictive lung disease.  ?

## 2021-05-14 NOTE — Evaluation (Signed)
Physical Therapy Evaluation ?Patient Details ?Name: [name] ?MRN: [id number] ?DOB: Feb 18, 1934 ?Today's Date: 05/14/2021 ? ?History of Present Illness ? 87 y.o. female with medical history significant of COPD, congested heart failure and osteoporosis who presented with worsening dyspnea.  Started off with a cough for about 7 days prior to admission.  Also noticed wheezing.  She was noted to have saturations of 80% on room air when she was evaluated in the urgent care center.  She was sent over to the emergency department and was hospitalized. ?  ?Clinical Impression ? Patient agreeable to participating in PT evaluation today. Patient currently on 2 L O2 via nasal cannula with wheezing noted at rest. Significant dorsal kyphosis noted. Patient functioning near her baseline level with short distance ambulation reported at home prior to admission with 8 hours/day 5 days/week of personal care assistant. Today, patient able to perform bed mobility, transfers and short distance ambulation using RW with min guard to modified independent. Patient's mobility currently limited by fatigue and dyspnea on exertion.  Patient would continue to benefit from skilled physical therapy in current environment and next venue to continue return to prior function and increase strength, endurance, balance, coordination, and functional mobility and gait skills.  ? ?   ? ?Recommendations for follow up therapy are one component of a multi-disciplinary discharge planning process, led by the attending physician.  Recommendations may be updated based on patient status, additional functional criteria and insurance authorization. ? ?Follow Up Recommendations No PT follow up ? ?  ?Assistance Recommended at Discharge Intermittent Supervision/Assistance  ?Patient can return home with the following ? A little help with walking and/or transfers;A little help with bathing/dressing/bathroom;Assistance with cooking/housework;Assist for transportation;Help  with stairs or ramp for entrance ? ?  ?Equipment Recommendations None recommended by PT  ?Recommendations for Other Services ?    ?  ?Functional Status Assessment Patient has had a recent decline in their functional status and demonstrates the ability to make significant improvements in function in a reasonable and predictable amount of time.  ? ?  ?Precautions / Restrictions Precautions ?Precautions: Fall ?Precaution Comments: reports no falls in last six months ?Restrictions ?Weight Bearing Restrictions: No  ? ?  ? ?Mobility ? Bed Mobility ?Overal bed mobility: Modified Independent ?  ?  ?General bed mobility comments: extra time ?  ? ?Transfers ?Overall transfer level: Needs assistance ?Equipment used: Rolling walker (2 wheels) ?Transfers: Sit to/from Stand, Bed to chair/wheelchair/BSC ?Sit to Stand: Min guard ?  ?Step pivot transfers: Min guard, Supervision ?  ?Anterior-Posterior transfers: Modified independent (Device/Increase time) ?  ?General transfer comment: increased time ?  ? ?Ambulation/Gait ?Ambulation/Gait assistance: Min guard, Supervision ?Gait Distance (Feet): 4 Feet ?Assistive device: Rolling walker (2 wheels) ?Gait Pattern/deviations: Step-through pattern, Decreased step length - right, Decreased step length - left, Decreased stride length, Narrow base of support ?Gait velocity: decreased ?  ?  ?General Gait Details: slow, labored cadence using RW; limited by fatigue; on 2 L O2 through nasal cannula, dysnpea on exertion ? ?Stairs ?  ?  ?  ?  ?  ? ?Wheelchair Mobility ?  ? ?Modified Rankin (Stroke Patients Only) ?  ? ?  ? ?Balance Overall balance assessment: Needs assistance ?Sitting-balance support: Single extremity supported, Feet supported ?  ?Sitting balance - Comments: relies on left UE in sitting ?Postural control: Left lateral lean ?Standing balance support: Bilateral upper extremity supported, During functional activity, Reliant on assistive device for balance ?Standing balance-Leahy Scale:  Fair ?Standing balance comment:  fair with RW ?  ?   ? ? ? ?Pertinent Vitals/Pain Pain Assessment ?Pain Assessment: 0-10 ?Pain Score: 5  ?Pain Location: lower back; complained of leg cramps ?Pain Intervention(s): Limited activity within patient's tolerance, Monitored during session, Repositioned  ? ? ?Home Living Family/patient expects to be discharged to:: Private residence ?Living Arrangements: Children (daughter, son in last and grandson) ?Available Help at Discharge: Personal care attendant;Family (aide 8 hours/day M-F) ?Type of Home: Mobile home ?Home Access: Ramped entrance ?  ?  ?  ?Home Layout: One level ?Home Equipment: Shower seat;Hand held shower head;Grab bars - tub/shower;Grab bars - toilet;Rolling Walker (2 wheels);Hospital bed;Wheelchair - manual ?   ?  ?Prior Function Prior Level of Function : Needs assist ?  ?  ?  ?Physical Assist : Mobility (physical);ADLs (physical) ?Mobility (physical): Transfers;Gait;Stairs ?ADLs (physical): IADLs;Dressing;Bathing;Grooming ?Mobility Comments: household distances with RW and aide present if assistance needed ?  ?  ? ? ?Hand Dominance  ? Dominant Hand: Right ? ?  ?Extremity/Trunk Assessment  ? Upper Extremity Assessment ?Upper Extremity Assessment: Defer to OT evaluation ?  ? ?Lower Extremity Assessment ?Lower Extremity Assessment: Generalized weakness ?  ? ?Cervical / Trunk Assessment ?Cervical / Trunk Assessment: Kyphotic  ?Communication  ? Communication: No difficulties  ?Cognition Arousal/Alertness: Awake/alert ?Behavior During Therapy: [facility] for tasks assessed/performed ?Overall Cognitive Status: Within Functional Limits for tasks assessed ?  ?  ? ?  ?General Comments   ? ?  ?Exercises    ? ?Assessment/Plan  ?  ?PT Assessment Patient needs continued PT services  ?PT Problem List Decreased strength;Decreased mobility;Decreased activity tolerance;Decreased balance ? ?   ?  ?PT Treatment Interventions DME instruction;Therapeutic exercise;Gait training;Balance  training;Neuromuscular re-education;Functional mobility training;Therapeutic activities;Patient/family education   ? ?PT Goals (Current goals can be found in the Care Plan section)  ?Acute Rehab PT Goals ?Patient Stated Goal: Go home with current supports in place. ?PT Goal Formulation: With patient ?Time For Goal Achievement: 05/28/21 ?Potential to Achieve Goals: Fair ? ?  ?Frequency Min 3X/week ?  ? ? ?   ?AM-PAC PT "6 Clicks" Mobility  ?Outcome Measure Help needed turning from your back to your side while in a flat bed without using bedrails?: None ?Help needed moving from lying on your back to sitting on the side of a flat bed without using bedrails?: A Little ?Help needed moving to and from a bed to a chair (including a wheelchair)?: A Little ?Help needed standing up from a chair using your arms (e.g., wheelchair or bedside chair)?: A Little ?Help needed to walk in hospital room?: A Little ?Help needed climbing 3-5 steps with a railing? : A Little ?6 Click Score: 19 ? ?  ?End of Session Equipment Utilized During Treatment: Gait belt ?Activity Tolerance: Patient limited by fatigue ?Patient left: in chair;with call bell/phone within reach;with chair alarm set ?Nurse Communication: Mobility status ?PT Visit Diagnosis: Unsteadiness on feet (R26.81);Other abnormalities of gait and mobility (R26.89);Muscle weakness (generalized) (M62.81) ?  ? ?Time: 1140-1210 ?PT Time Calculation (min) (ACUTE ONLY): 30 min ? ? ?Charges:   PT Evaluation ?$PT Eval Low Complexity: 1 Low ?PT Treatments ?$Therapeutic Activity: 8-22 mins ?  ?   ? ? ?Floria Raveling. Hartnett-Rands, MS, PT ?Per [place] ?[place] #[id number] ? ?Kaileah Shevchenko  Hartnett-Rands ?05/14/2021, 12:20 PM ? ?

## 2021-05-14 NOTE — Assessment & Plan Note (Signed)
Dementia ? ?Plan to continue with alprazolam, donepezil, memantine  ?

## 2021-05-14 NOTE — Plan of Care (Signed)
?  Problem: Acute Rehab PT Goals(only PT should resolve) ?Goal: Patient Will Transfer Sit To/From Stand ?Outcome: Progressing ?Flowsheets (Taken 05/14/2021 1225) ?Patient will transfer sit to/from stand: with supervision ?Goal: Pt Will Transfer Bed To Chair/Chair To Bed ?Outcome: Progressing ?Flowsheets (Taken 05/14/2021 1225) ?Pt will Transfer Bed to Chair/Chair to Bed: with supervision ?Goal: Pt Will Ambulate ?Outcome: Progressing ?Flowsheets (Taken 05/14/2021 1225) ?Pt will Ambulate: ? 15 feet ? with rolling walker ?Goal: Pt/caregiver will Perform Home Exercise Program ?Outcome: Progressing ?Flowsheets (Taken 05/14/2021 1225) ?Pt/caregiver will Perform Home Exercise Program: ? For increased strengthening ? For improved balance ? With Supervision, verbal cues required/provided ?  ?Floria Raveling. Hartnett-Rands, MS, PT ?Per [place] ?[place] #[id number] ?05/14/2021 ? ?

## 2021-05-14 NOTE — Progress Notes (Signed)
Initial Nutrition Assessment ? ?DOCUMENTATION CODES:  ? ?  ? ?INTERVENTION:  ?Regular diet  ? ?Peanut butter crackers ? ?NUTRITION DIAGNOSIS:  ? ?Inadequate oral intake related to acute illness (COPD exacerbation) as evidenced by meal completion < 50%. ? ? ?GOAL:  ?Patient will meet greater than or equal to 90% of their needs ? ? ?MONITOR:  ?PO intake, Weight trends, Labs ? ?REASON FOR ASSESSMENT:  ? ?Consult ?Assessment of nutrition requirement/status ? ?ASSESSMENT: Patient is a 87 yo female with hx of COPD, CHF, Dementia, GERD and IBS. She presents with shortness of breath. COPD exacerbation. ? ?Patient is up in chair eating lunch during RD visit. No family present. Feeding herself. She is taking her time to eat due to shortness of breath but is feeling better. Patient doesn't drink ONS but likes coffee, water and sprite. Missing teeth noted. Reports consuming 50% of meals. She also has peanut butter crackers for her preferred snack.  ? ?Weight is stable between 47-50 kg the past 2+ years.  ? ?Medications: lasix, namenda, aricept, MVI, Protonix ? ?Labs: ? ?  Latest Ref Rng & Units 05/14/2021  ?  4:44 AM 05/13/2021  ?  8:22 PM 12/25/2020  ?  4:54 PM  ?BMP  ?Glucose 70 - 99 mg/dL 151   88   91    ?BUN 8 - 23 mg/dL '19   19   20    '$ ?Creatinine 0.44 - 1.00 mg/dL 1.00   1.17   1.00    ?Sodium 135 - 145 mmol/L 135   136   138    ?Potassium 3.5 - 5.1 mmol/L 5.0   4.2   4.2    ?Chloride 98 - 111 mmol/L 100   97   97    ?CO2 22 - 32 mmol/L '28   30   28    '$ ?Calcium 8.9 - 10.3 mg/dL 8.9   9.0   8.7    ?   ? ?NUTRITION - FOCUSED PHYSICAL EXAM: ?Deferred to follow up- patient is eating lunch. ? ? ?Diet Order:   ?Diet Order   ? ?       ?  Diet regular Room service appropriate? Yes; Fluid consistency: Thin  Diet effective now       ?  ? ?  ?  ? ?  ? ? ?EDUCATION NEEDS:  ?Not appropriate for education at this time ? ?Skin:  Skin Assessment: Reviewed RN Assessment ? ?Last BM:  5/10 large -type 3 ? ?Height:  ? ?Ht Readings from Last 1  Encounters:  ?05/13/21 5' (1.524 m)  ? ? ?Weight:  ? ?Wt Readings from Last 1 Encounters:  ?05/13/21 48.8 kg  ? ? ?Ideal Body Weight:   45 kg ? ?BMI:  Body mass index is 21.01 kg/m?. ? ?Estimated Nutritional Needs:  ? ?Kcal:  5747-3403 ? ?Protein:  64-69 gr ? ?Fluid:  1250 ml ? ?Colman Cater MS,RD,CSG,LDN ?Contact: [facility]  ? ? ?

## 2021-05-14 NOTE — Assessment & Plan Note (Signed)
Continue with statin therapy.  ?

## 2021-05-14 NOTE — Progress Notes (Signed)
?  Transition of Care (TOC) Screening Note ? ? ?Patient Details  ?Name: [name] ?Date of Birth: 06-25-1934 ? ? ?Transition of Care (TOC) CM/SW Contact:    ?Iona Beard, LCSWA ?Phone Number: ?05/14/2021, 1:46 PM ? ? ? ?Transition of Care Department [facility]) has reviewed patient and no TOC needs have been identified at this time. We will continue to monitor patient advancement through interdisciplinary progression rounds. If new patient transition needs arise, please place a TOC consult. ?  ?

## 2021-05-15 DIAGNOSIS — J441 Chronic obstructive pulmonary disease with (acute) exacerbation: Secondary | ICD-10-CM | POA: Diagnosis not present

## 2021-05-15 DIAGNOSIS — J9601 Acute respiratory failure with hypoxia: Secondary | ICD-10-CM | POA: Diagnosis not present

## 2021-05-15 LAB — CBC
HCT: 33.1 % — ABNORMAL LOW (ref 36.0–46.0)
Hemoglobin: 10.1 g/dL — ABNORMAL LOW (ref 12.0–15.0)
MCH: 27.3 pg (ref 26.0–34.0)
MCHC: 30.5 g/dL (ref 30.0–36.0)
MCV: 89.5 fL (ref 80.0–100.0)
Platelets: 308 10*3/uL (ref 150–400)
RBC: 3.7 MIL/uL — ABNORMAL LOW (ref 3.87–5.11)
RDW: 13.3 % (ref 11.5–15.5)
WBC: 8.2 10*3/uL (ref 4.0–10.5)
nRBC: 0 % (ref 0.0–0.2)

## 2021-05-15 LAB — BASIC METABOLIC PANEL
Anion gap: 9 (ref 5–15)
BUN: 24 mg/dL — ABNORMAL HIGH (ref 8–23)
CO2: 27 mmol/L (ref 22–32)
Calcium: 8.9 mg/dL (ref 8.9–10.3)
Chloride: 103 mmol/L (ref 98–111)
Creatinine, Ser: 1.02 mg/dL — ABNORMAL HIGH (ref 0.44–1.00)
GFR, Estimated: 54 mL/min — ABNORMAL LOW (ref >60)
Glucose, Bld: 146 mg/dL — ABNORMAL HIGH (ref 70–99)
Potassium: 4.4 mmol/L (ref 3.5–5.1)
Sodium: 139 mmol/L (ref 135–145)

## 2021-05-15 MED ORDER — PREDNISONE 20 MG PO TABS
60.0000 mg | ORAL_TABLET | Freq: Every day | ORAL | Status: DC
Start: 2021-05-16 — End: 2021-05-16
  Administered 2021-05-16: 60 mg via ORAL
  Filled 2021-05-15: qty 3

## 2021-05-15 NOTE — Progress Notes (Signed)
? ?[facility] ?PROGRESS NOTE ? ? ?[name] PJK:[id number] DOB: Apr 23, 1934 DOA: 05/13/2021 ? ?PCP: Alanson Puls, [facility] ? ?Brief History/Interval Summary: 87 y.o. female with medical history significant of COPD, congested heart failure and osteoporosis who presented with worsening dyspnea.  Started off with a cough for about 7 days prior to admission.  Also noticed wheezing.  She was noted to have saturations of 80% on room air when she was evaluated in the urgent care center.  She was sent over to the emergency department and was hospitalized.   ? ?Consultants: None ? ?Procedures: None ? ? ? ?Subjective/Interval History: ?Patient mentions that she is feeling better today.  Less short of breath.  Less cough.  No chest pain.  No nausea or vomiting.  Does not use oxygen at home. ? ? ?Assessment/Plan: ? ?COPD with acute exacerbation/acute respiratory failure with hypoxia ?Saturations were 80% on room air at the urgent care center ?Patient was wheezing profusely when she was hospitalized.  She was started on nebulizer treatments, steroids.  Started also on doxycycline. ?Will need home O2 assessment prior to discharge ?COVID-19 and influenza PCR's were negative. ?Patient is improving.  Will start cutting back on her steroids.  Ambulate.  Hopefully discharge in 24 to 48 hours. ? ?Chronic diastolic CHF ?No clinical signs of decompensated heart failure.  Echocardiogram from 2020 showed preserved left ventricular systolic function.  Patient being continued on her furosemide. ? ?History of depression and dementia ?Noted to be on Aricept, memantine.  Also noted to be on alprazolam.  Noted to be on Ambien.  Need to be cautious with these medications in elderly patients.  She seems to be tolerating them well. ? ?Hyperlipidemia ?Continue with statin therapy. ? ?Normocytic anemia ?No evidence of overt bleeding.  Hemoglobin is stable.  Continue to monitor. ? ? ?DVT Prophylaxis: Lovenox ?Code Status: DNR ?Family  Communication: No family at bedside.  Discussed with patient ?Disposition Plan: Hopefully return home when improved.  Seen by PT and OT.  No follow-up recommended. ? ?Status is: Inpatient ?Remains inpatient appropriate because: COPD exacerbation ? ? ? ? ? ?Medications: Scheduled: ? ALPRAZolam  0.5 mg Oral BID  ? donepezil  10 mg Oral Daily  ? doxycycline  100 mg Oral Q12H  ? enoxaparin (LOVENOX) injection  30 mg Subcutaneous Q24H  ? furosemide  40 mg Oral Daily  ? guaiFENesin-dextromethorphan  5 mL Oral QID  ? ipratropium-albuterol  3 mL Nebulization TID  ? loratadine  10 mg Oral Daily  ? memantine  10 mg Oral BID  ? methylPREDNISolone (SOLU-MEDROL) injection  40 mg Intravenous Q12H  ? mometasone-formoterol  2 puff Inhalation BID  ? multivitamin with minerals  1 tablet Oral Daily  ? pantoprazole  40 mg Oral Daily  ? potassium chloride  10 mEq Oral Daily  ? simvastatin  5 mg Oral Daily  ? zolpidem  5 mg Oral QHS  ? ?Continuous: ?YKD:[id number] **OR** acetaminophen, ipratropium-albuterol, ondansetron **OR** ondansetron (ZOFRAN) IV, traMADol ? ?Antibiotics: ?Anti-infectives (From admission, onward)  ? ? Start     Dose/Rate Route Frequency Ordered Stop  ? 05/14/21 1100  doxycycline (VIBRA-TABS) tablet 100 mg       ? 100 mg Oral Every 12 hours 05/14/21 1010 05/19/21 0959  ? ?  ? ? ?Objective: ? ?Vital Signs ? ?Vitals:  ? 05/14/21 2029 05/15/21 0513 05/15/21 0755 05/15/21 0943  ?BP: 122/79 (!) 141/90  107/67  ?Pulse: 92 77  84  ?Resp: '19 18  16  '$ ?  Temp: 98.2 ?F (36.8 ?C) 97.6 ?F (36.4 ?C)  97.7 ?F (36.5 ?C)  ?TempSrc:    Oral  ?SpO2: 98% 97% 96% 98%  ?Weight:      ?Height:      ? ? ?Intake/Output Summary (Last 24 hours) at 05/15/2021 1106 ?Last data filed at 05/15/2021 6761 ?Gross per 24 hour  ?Intake 960 ml  ?Output 1000 ml  ?Net -40 ml  ? ? ?Filed Weights  ? 05/13/21 2009 05/13/21 2311  ?Weight: 49.9 kg 48.8 kg  ? ? ?General appearance: Awake alert.  In no distress ?Resp: Less tachypneic today at rest.  Improved air  entry bilaterally.  Very few wheezes appreciated today.  No crackles today.  No rhonchi ?Cardio: S1-S2 is normal regular.  No S3-S4.  No rubs murmurs or bruit ?GI: Abdomen is soft.  Nontender nondistended.  Bowel sounds are present normal.  No masses organomegaly ?Extremities: No edema.  Full range of motion of lower extremities. ?Neurologic: Alert and oriented x3.  No focal neurological deficits.  ? ? ?Lab Results: ? ?Data Reviewed: I have personally reviewed following labs and reports of the imaging studies ? ?CBC: ?Recent Labs  ?Lab 05/13/21 ?2022 05/14/21 ?0444 05/15/21 ?9509  ?WBC 8.5 7.9 8.2  ?NEUTROABS 5.4  --   --   ?HGB 11.0* 10.3* 10.1*  ?HCT 34.7* 33.2* 33.1*  ?MCV 89.4 89.2 89.5  ?PLT 305 284 308  ? ? ? ?Basic Metabolic Panel: ?Recent Labs  ?Lab 05/13/21 ?2022 05/14/21 ?0444 05/15/21 ?3267  ?NA 136 135 139  ?K 4.2 5.0 4.4  ?CL 97* 100 103  ?CO2 '30 28 27  '$ ?GLUCOSE 88 151* 146*  ?BUN 19 19 24*  ?CREATININE 1.17* 1.00 1.02*  ?CALCIUM 9.0 8.9 8.9  ? ? ? ?GFR: ?Estimated Creatinine Clearance: 29.5 mL/min (A) (by C-G formula based on SCr of 1.02 mg/dL (H)). ? ? ?Recent Results (from the past 240 hour(s))  ?Resp Panel by RT-PCR (Flu A&B, Covid) Nasopharyngeal Swab     Status: None  ? Collection Time: 05/13/21 11:45 PM  ? Specimen: Nasopharyngeal Swab; Nasopharyngeal(NP) swabs in vial transport medium  ?Result Value Ref Range Status  ? SARS Coronavirus 2 by RT PCR NEGATIVE NEGATIVE Final  ?  Comment: (NOTE) ?SARS-CoV-2 target nucleic acids are NOT DETECTED. ? ?The SARS-CoV-2 RNA is generally detectable in upper respiratory ?specimens during the acute phase of infection. The lowest ?concentration of SARS-CoV-2 viral copies this assay can detect is ?138 copies/mL. A negative result does not preclude SARS-Cov-2 ?infection and should not be used as the sole basis for treatment or ?other patient management decisions. A negative result may occur with  ?improper specimen collection/handling, submission of specimen  other ?than nasopharyngeal swab, presence of viral mutation(s) within the ?areas targeted by this assay, and inadequate number of viral ?copies(<138 copies/mL). A negative result must be combined with ?clinical observations, patient history, and epidemiological ?information. The expected result is Negative. ? ?Fact Sheet for Patients:  ?EntrepreneurPulse.com.au ? ?Fact Sheet for Healthcare Providers:  ?IncredibleEmployment.be ? ?This test is no t yet approved or cleared by the [place] FDA and  ?has been authorized for detection and/or diagnosis of SARS-CoV-2 by ?FDA under an Emergency Use Authorization (EUA). This EUA will remain  ?in effect (meaning this test can be used) for the duration of the ?COVID-19 declaration under Section 564(b)(1) of the Act, 21 ?U.S.C.section 360bbb-3(b)(1), unless the authorization is terminated  ?or revoked sooner.  ? ? ?  ? Influenza A by PCR NEGATIVE  NEGATIVE Final  ? Influenza B by PCR NEGATIVE NEGATIVE Final  ?  Comment: (NOTE) ?The Xpert Xpress SARS-CoV-2/FLU/RSV plus assay is intended as an aid ?in the diagnosis of influenza from Nasopharyngeal swab specimens and ?should not be used as a sole basis for treatment. Nasal washings and ?aspirates are unacceptable for Xpert Xpress SARS-CoV-2/FLU/RSV ?testing. ? ?Fact Sheet for Patients: ?EntrepreneurPulse.com.au ? ?Fact Sheet for Healthcare Providers: ?IncredibleEmployment.be ? ?This test is not yet approved or cleared by the [place] FDA and ?has been authorized for detection and/or diagnosis of SARS-CoV-2 by ?FDA under an Emergency Use Authorization (EUA). This EUA will remain ?in effect (meaning this test can be used) for the duration of the ?COVID-19 declaration under Section 564(b)(1) of the Act, 21 U.S.C. ?section 360bbb-3(b)(1), unless the authorization is terminated or ?revoked. ? ?Performed at [facility], [address], [place], [place]  [address] ?  ? ?  ? ? ?Radiology Studies: ?DG Chest 2 View ? ?Result Date: 05/13/2021 ?CLINICAL DATA:  Shortness of breath EXAM: CHEST - 2 VIEW COMPARISON:  03/14/2021, 10/18/2018, CT 06/29/2018 FINDINGS: Diffuse bilatera

## 2021-05-15 NOTE — Evaluation (Signed)
Occupational Therapy Evaluation ?Patient Details ?Name: [name] ?MRN: [id number] ?DOB: 02-02-1934 ?Today's Date: 05/15/2021 ? ? ?History of Present Illness 87 y.o. female with medical history significant of COPD, congested heart failure and osteoporosis who presented with worsening dyspnea.  Started off with a cough for about 7 days prior to admission.  Also noticed wheezing.  She was noted to have saturations of 80% on room air when she was evaluated in the urgent care center.  She was sent over to the emergency department and was hospitalized.  ? ?Clinical Impression ?  ?Pt agreeable to OT evaluation. Pt reports no home O2 so pt was removed from supplemental O2. During session pt was able to maintain SpO2 over 90% typically hovering around 92 to 95% SpO2. Pt at level of mod I to supervision for ADL's and transfers. Using RW in standing. Pt left in chair with no supplemental O2 with nurse notified. Pt is not recommended for further acute OT services and will be discharged to care of nursing staff for remaining length of stay.   ?   ? ?Recommendations for follow up therapy are one component of a multi-disciplinary discharge planning process, led by the attending physician.  Recommendations may be updated based on patient status, additional functional criteria and insurance authorization.  ? ?Follow Up Recommendations ? No OT follow up  ?  ?Assistance Recommended at Discharge Set up Supervision/Assistance  ?Patient can return home with the following A little help with walking and/or transfers;Help with stairs or ramp for entrance ? ?  ?Functional Status Assessment ? Patient has not had a recent decline in their functional status  ?Equipment Recommendations ? None recommended by OT  ?  ?Recommendations for Other Services   ? ? ?  ?Precautions / Restrictions Precautions ?Precautions: Fall ?Restrictions ?Weight Bearing Restrictions: No  ? ?  ? ?Mobility Bed Mobility ?Overal bed mobility: Modified Independent ?  ?  ?   ?  ?  ?  ?  ?  ? ?Transfers ?Overall transfer level: Needs assistance ?Equipment used: Rolling walker (2 wheels) ?Transfers: Sit to/from Stand, Bed to chair/wheelchair/BSC ?Sit to Stand: Supervision ?  ?  ?Step pivot transfers: Supervision ?  ?  ?General transfer comment: no need for physical assist ?  ? ?  ?Balance Overall balance assessment: Needs assistance ?Sitting-balance support: Feet supported, No upper extremity supported ?Sitting balance-Leahy Scale: Good ?Sitting balance - Comments: seated EOB ?  ?Standing balance support: Bilateral upper extremity supported, During functional activity, Reliant on assistive device for balance ?Standing balance-Leahy Scale: Fair ?Standing balance comment: fair with RW ?  ?  ?  ?  ?  ?  ?  ?  ?  ?  ?  ?   ? ?ADL either performed or assessed with clinical judgement  ? ?ADL Overall ADL's : Needs assistance/impaired ?  ?  ?Grooming: Modified independent;Wash/dry hands;Standing ?  ?  ?  ?  ?  ?Upper Body Dressing : Modified independent ?  ?Lower Body Dressing: Modified independent ?  ?Toilet Transfer: Ambulation;Rolling walker (2 wheels);Supervision/safety ?Toilet Transfer Details (indicate cue type and reason): Standing at sink and transfering to chair with RW. ?  ?  ?  ?  ?Functional mobility during ADLs: Supervision/safety;Rolling walker (2 wheels) ?General ADL Comments: Pt at level of Mod I to supervision for ADL's and transfers.  ? ? ? ?Vision Baseline Vision/History: 0 No visual deficits ?Ability to See in Adequate Light: 0 Adequate ?Patient Visual Report: No change from baseline ?Vision Assessment?:  No apparent visual deficits  ?   ?Perception   ?  ?Praxis   ?  ? ?Pertinent Vitals/Pain Pain Assessment ?Pain Assessment: No/denies pain  ? ? ? ?Hand Dominance Right ?  ?Extremity/Trunk Assessment Upper Extremity Assessment ?Upper Extremity Assessment: Generalized weakness (arthritis; limited fine motor skills) ?  ?Lower Extremity Assessment ?Lower Extremity Assessment: Defer  to PT evaluation ?  ?Cervical / Trunk Assessment ?Cervical / Trunk Assessment: Kyphotic ?  ?Communication Communication ?Communication: No difficulties ?  ?Cognition Arousal/Alertness: Awake/alert ?Behavior During Therapy: [facility] for tasks assessed/performed ?Overall Cognitive Status: Within Functional Limits for tasks assessed ?  ?  ?  ?  ?  ?  ?  ?  ?  ?  ?  ?  ?  ?  ?  ?  ?  ?  ?  ?   ? ?  ?   ?  ?    ? ? ?Home Living Family/patient expects to be discharged to:: Private residence ?Living Arrangements: Children ?Available Help at Discharge: Personal care attendant;Family (8 hours a day M-F) ?Type of Home: Mobile home ?Home Access: Ramped entrance ?  ?  ?Home Layout: One level ?  ?  ?Bathroom Shower/Tub: Walk-in shower ?  ?Bathroom Toilet: Standard ?Bathroom Accessibility: No ?  ?Home Equipment: Shower seat;Hand held shower head;Grab bars - tub/shower;Grab bars - toilet;Rolling Walker (2 wheels);Hospital bed;Wheelchair - manual ?  ?  ?  ? ?  ?Prior Functioning/Environment Prior Level of Function : Needs assist ?  ?  ?  ?Physical Assist : Mobility (physical);ADLs (physical) ?Mobility (physical): Transfers;Gait;Stairs ?ADLs (physical): IADLs ?Mobility Comments: household distances with RW and aide present if assistance needed ?ADLs Comments: Independence with ADL's ; assist for groceries ?  ? ?  ?  ?   ?  ?   ?    ?  ?OT Goals(Current goals can be found in the care plan section) Acute Rehab OT Goals ?Patient Stated Goal: return home  ?OT Frequency:   ?  ? ?   ?  ?  ?  ?  ? ?  ?AM-PAC OT "6 Clicks" Daily Activity     ?Outcome Measure Help from another person eating meals?: A Little ?Help from another person taking care of personal grooming?: A Little ?Help from another person toileting, which includes using toliet, bedpan, or urinal?: A Little ?Help from another person bathing (including washing, rinsing, drying)?: None ?Help from another person to put on and taking off regular upper body clothing?: None ?Help from  another person to put on and taking off regular lower body clothing?: None ?6 Click Score: 21 ?  ?End of Session Equipment Utilized During Treatment: Rolling walker (2 wheels) ?Nurse Communication: Mobility status;Other (comment) (notified pt removed from supplental O2.) ? ?Activity Tolerance: Patient tolerated treatment well ?Patient left: in chair;with call bell/phone within reach ? ?OT Visit Diagnosis: Unsteadiness on feet (R26.81);Other abnormalities of gait and mobility (R26.89)  ?              ?Time: 1191-4782 ?OT Time Calculation (min): 20 min ?Charges:  OT General Charges ?$OT Visit: 1 Visit ?OT Evaluation ?$OT Eval Low Complexity: 1 Low ? ?Larey Seat OT, MOT ? ?Larey Seat ?05/15/2021, 9:52 AM ?

## 2021-05-15 NOTE — Care Management Important Message (Signed)
Important Message ? ?Patient Details  ?Name: [name] ?MRN: [id number] ?Date of Birth: 01-07-1934 ? ? ?Medicare Important Message Given:  Yes ? ? ? ? ?Tommy Medal ?05/15/2021, 1:12 PM ?

## 2021-05-15 NOTE — Progress Notes (Signed)
Physical Therapy Treatment ?Patient Details ?Name: [name] ?MRN: [id number] ?DOB: [birth year] ?Today's Date: 05/15/2021 ? ? ?History of Present Illness 87 y.o. female with medical history significant of COPD, congested heart failure and osteoporosis who presented with worsening dyspnea.  Started off with a cough for about 7 days prior to admission.  Also noticed wheezing.  She was noted to have saturations of 80% on room air when she was evaluated in the urgent care center.  She was sent over to the emergency department and was hospitalized. ? ?  ?PT Comments  ? ? Patient seated in chair at beginning of session. O2 sat monitored throughout session at 97-100% on room air, decrease to 95-96% with ambulation and exercise on room air with minimal SOB and general fatigue. Patient tolerates ambulating increased distance today and performs seated exercises in chair at end of session. Patient will benefit from continued skilled physical therapy in hospital and recommended venue below to increase strength, balance, endurance for safe ADLs and gait. ?   ?Recommendations for follow up therapy are one component of a multi-disciplinary discharge planning process, led by the attending physician.  Recommendations may be updated based on patient status, additional functional criteria and insurance authorization. ? ?Follow Up Recommendations ? No PT follow up ?  ?  ?Assistance Recommended at Discharge Intermittent Supervision/Assistance  ?Patient can return home with the following A little help with walking and/or transfers;A little help with bathing/dressing/bathroom;Assistance with cooking/housework;Assist for transportation;Help with stairs or ramp for entrance ?  ?Equipment Recommendations ? None recommended by PT  ?  ?Recommendations for Other Services   ? ? ?  ?Precautions / Restrictions Precautions ?Precautions: Fall ?Precaution Comments: reports no falls in last six months ?Restrictions ?Weight Bearing Restrictions: No  ?   ? ?Mobility ? Bed Mobility ?  ?  ?  ?  ?  ?  ?  ?General bed mobility comments: seated in chair at beginning of session ?  ? ?Transfers ?Overall transfer level: Needs assistance ?Equipment used: Rolling walker (2 wheels) ?Transfers: Sit to/from Stand, Bed to chair/wheelchair/BSC ?Sit to Stand: Supervision ?  ?Step pivot transfers: Supervision ?  ?  ?  ?  ?  ? ?Ambulation/Gait ?Ambulation/Gait assistance: Min guard, Supervision ?Gait Distance (Feet): 90 Feet ?Assistive device: Rolling walker (2 wheels) ?  ?Gait velocity: decreased ?  ?  ?General Gait Details: slow, labored, slightly unsteady with use of RW ? ? ?Stairs ?  ?  ?  ?  ?  ? ? ?Wheelchair Mobility ?  ? ?Modified Rankin (Stroke Patients Only) ?  ? ? ?  ?Balance Overall balance assessment: Needs assistance ?Sitting-balance support: Feet supported, No upper extremity supported ?Sitting balance-Leahy Scale: Good ?Sitting balance - Comments: seated EOB ?  ?Standing balance support: Bilateral upper extremity supported, During functional activity, Reliant on assistive device for balance ?Standing balance-Leahy Scale: Fair ?Standing balance comment: fair with RW ?  ?  ?  ?  ?  ?  ?  ?  ?  ?  ?  ?  ? ?  ?Cognition Arousal/Alertness: Awake/alert ?Behavior During Therapy: [facility] for tasks assessed/performed ?Overall Cognitive Status: Within Functional Limits for tasks assessed ?  ?  ?  ?  ?  ?  ?  ?  ?  ?  ?  ?  ?  ?  ?  ?  ?  ?  ?  ? ?  ?Exercises General Exercises - Lower Extremity ?Long Arc Quad: AROM, Both, 10 reps, Seated ?Hip Flexion/Marching:  AROM, Both, 10 reps, Seated ?Toe Raises: AROM, Both, 10 reps, Seated ?Heel Raises: AROM, Both, 10 reps, Seated ? ?  ?General Comments   ?  ?  ? ?Pertinent Vitals/Pain Pain Assessment ?Pain Assessment: No/denies pain  ? ? ?Home Living Family/patient expects to be discharged to:: Private residence ?Living Arrangements: Children ?Available Help at Discharge: Personal care attendant;Family (8 hours a day M-F) ?Type of Home:  Mobile home ?Home Access: Ramped entrance ?  ?  ?  ?Home Layout: One level ?Home Equipment: Shower seat;Hand held shower head;Grab bars - tub/shower;Grab bars - toilet;Rolling Walker (2 wheels);Hospital bed;Wheelchair - manual ?   ?  ?Prior Function    ?  ?  ?   ? ?PT Goals (current goals can now be found in the care plan section) Acute Rehab PT Goals ?Patient Stated Goal: Go home with current supports in place. ?PT Goal Formulation: With patient ?Time For Goal Achievement: 05/28/21 ?Potential to Achieve Goals: Fair ?Progress towards PT goals: Progressing toward goals ? ?  ?Frequency ? ? ? Min 3X/week ? ? ? ?  ?PT Plan Current plan remains appropriate  ? ? ?Co-evaluation   ?  ?  ?  ?  ? ?  ?AM-PAC PT "6 Clicks" Mobility   ?Outcome Measure ? Help needed turning from your back to your side while in a flat bed without using bedrails?: None ?Help needed moving from lying on your back to sitting on the side of a flat bed without using bedrails?: A Little ?Help needed moving to and from a bed to a chair (including a wheelchair)?: A Little ?Help needed standing up from a chair using your arms (e.g., wheelchair or bedside chair)?: A Little ?Help needed to walk in hospital room?: A Little ?Help needed climbing 3-5 steps with a railing? : A Little ?6 Click Score: 19 ? ?  ?End of Session Equipment Utilized During Treatment: Gait belt ?Activity Tolerance: Patient limited by fatigue ?Patient left: in chair;with call bell/phone within reach;with chair alarm set ?Nurse Communication: Mobility status ?PT Visit Diagnosis: Unsteadiness on feet (R26.81);Other abnormalities of gait and mobility (R26.89);Muscle weakness (generalized) (M62.81) ?  ? ? ?Time: 0955-1006 ?PT Time Calculation (min) (ACUTE ONLY): 11 min ? ?Charges:  $Therapeutic Activity: 8-22 mins          ? ?11:47 AM, 05/15/21 ?Mearl Latin PT, DPT ?Physical Therapist at [facility] ?[facility] ? ?

## 2021-05-16 DIAGNOSIS — I5032 Chronic diastolic (congestive) heart failure: Secondary | ICD-10-CM | POA: Diagnosis not present

## 2021-05-16 DIAGNOSIS — F32A Depression, unspecified: Secondary | ICD-10-CM | POA: Diagnosis not present

## 2021-05-16 DIAGNOSIS — J9601 Acute respiratory failure with hypoxia: Secondary | ICD-10-CM | POA: Diagnosis not present

## 2021-05-16 DIAGNOSIS — J441 Chronic obstructive pulmonary disease with (acute) exacerbation: Secondary | ICD-10-CM | POA: Diagnosis not present

## 2021-05-16 MED ORDER — PREDNISONE 10 MG PO TABS: ORAL_TABLET | ORAL | 0 refills | Status: DC

## 2021-05-16 MED ORDER — IPRATROPIUM-ALBUTEROL 0.5-2.5 (3) MG/3ML IN SOLN
3.0000 mL | Freq: Two times a day (BID) | RESPIRATORY_TRACT | Status: DC
  Administered 2021-05-16: 3 mL via RESPIRATORY_TRACT
  Filled 2021-05-16: qty 3

## 2021-05-16 MED ORDER — GUAIFENESIN ER 600 MG PO TB12: 600.0000 mg | ORAL_TABLET | Freq: Two times a day (BID) | ORAL | 2 refills | Status: DC

## 2021-05-16 MED ORDER — DOXYCYCLINE HYCLATE 100 MG PO TABS: 100.0000 mg | ORAL_TABLET | Freq: Two times a day (BID) | ORAL | 0 refills | Status: AC

## 2021-05-16 MED ORDER — DM-GUAIFENESIN ER 30-600 MG PO TB12: 1.0000 | ORAL_TABLET | Freq: Two times a day (BID) | ORAL | 0 refills | Status: DC

## 2021-05-16 NOTE — Progress Notes (Signed)
Nsg Discharge Note ? ?Admit Date:  05/13/2021 ?Discharge date: 05/16/2021 ?  ?[name] to be D/C'd Home per MD order.  AVS completed.  ?Patient/caregiver able to verbalize understanding. ? ?Discharge Medication: ?Allergies as of 05/16/2021   ? ?   Reactions  ? Codeine Shortness Of Breath, Rash  ? Codeine Anaphylaxis  ? Levofloxacin Anaphylaxis  ? Patient was admitted to hospital with lung problems due to this medication  ? Sulfonamide Derivatives Hives, Shortness Of Breath  ? Sulfa Antibiotics Hives  ? ?  ? ?  ?Medication List  ?  ? ?STOP taking these medications   ? ?HYDROcodone-acetaminophen 5-325 MG tablet ?Commonly known as: NORCO/VICODIN ?  ?lidocaine 5 % ointment ?Commonly known as: XYLOCAINE ?  ?linaclotide 145 MCG Caps capsule ?Commonly known as: LINZESS ?  ?meloxicam 7.5 MG tablet ?Commonly known as: MOBIC ?  ?methocarbamol 500 MG tablet ?Commonly known as: ROBAXIN ?  ?tamsulosin 0.4 MG Caps capsule ?Commonly known as: FLOMAX ?  ?tiZANidine 4 MG tablet ?Commonly known as: ZANAFLEX ?  ? ?  ? ?TAKE these medications   ? ?albuterol 108 (90 Base) MCG/ACT inhaler ?Commonly known as: ProAir HFA ?Inhale 2 puffs into the lungs every 4 (four) hours as needed. ?  ?albuterol (2.5 MG/3ML) 0.083% nebulizer solution ?Commonly known as: PROVENTIL ?Take 3 mLs (2.5 mg total) by nebulization every 6 (six) hours as needed for wheezing or shortness of breath. ?  ?ALPRAZolam 0.5 MG tablet ?Commonly known as: Duanne Moron ?Take 0.5 mg by mouth 2 (two) times daily. ?  ?cetirizine 10 MG tablet ?Commonly known as: ZYRTEC ?Take 10 mg by mouth daily as needed for allergies. ?  ?dextromethorphan-guaiFENesin 30-600 MG 12hr tablet ?Commonly known as: [employer] DM ?Take 1 tablet by mouth 2 (two) times daily. ?  ?diclofenac Sodium 1 % Gel ?Commonly known as: VOLTAREN ?APPLY AS DIRECTED TO THE AFFECTED KNEE FOUR TIMES DAILY. ?What changed: See the new instructions. ?  ?donepezil 10 MG tablet ?Commonly known as: ARICEPT ?Take 10 mg by mouth  daily. ?  ?doxycycline 100 MG tablet ?Commonly known as: VIBRA-TABS ?Take 1 tablet (100 mg total) by mouth every 12 (twelve) hours for 3 days. ?  ?furosemide 40 MG tablet ?Commonly known as: LASIX ?Take 40 mg by mouth daily. ?  ?guaiFENesin 600 MG 12 hr tablet ?Commonly known as: Mucinex ?Take 1 tablet (600 mg total) by mouth 2 (two) times daily. ?  ?memantine 10 MG tablet ?Commonly known as: NAMENDA ?Take 10 mg by mouth 2 (two) times daily. ?  ?multivitamin with minerals Tabs tablet ?Take 1 tablet by mouth daily. ?  ?pantoprazole 40 MG tablet ?Commonly known as: PROTONIX ?TAKE (1) TABLET BY MOUTH TWICE A DAY WITH MEALS (BREAKFAST AND SUPPER) ?What changed: See the new instructions. ?  ?potassium chloride 10 MEQ tablet ?Commonly known as: KLOR-CON ?Take 1 tablet (10 mEq total) by mouth daily. ?  ?predniSONE 10 MG tablet ?Commonly known as: DELTASONE ?Take '40mg'$  po daily for 2 days then '30mg'$  daily for 2 days then '20mg'$  daily for 2 days then '10mg'$  daily for 2 days then stop ?  ?simvastatin 10 MG tablet ?Commonly known as: ZOCOR ?Take 5 mg by mouth daily. ?  ?traMADol 50 MG tablet ?Commonly known as: ULTRAM ?Take 100 mg by mouth every 6 (six) hours as needed for moderate pain. ?What changed: Another medication with the same name was removed. Continue taking this medication, and follow the directions you see here. ?  ?umeclidinium-vilanterol 62.5-25 MCG/INH Aepb ?Commonly  known as: ANORO ELLIPTA ?Inhale 1 puff into the lungs daily. ?  ?zolpidem 5 MG tablet ?Commonly known as: AMBIEN ?Take 1 tablet (5 mg total) by mouth at bedtime as needed for up to 13 days for sleep. X 14 days ?What changed:  ?when to take this ?additional instructions ?  ? ?  ? ? ?Discharge Assessment: ?Vitals:  ? 05/16/21 0816 05/16/21 1333  ?BP:  136/79  ?Pulse:  86  ?Resp:  18  ?Temp:  97.9 ?F (36.6 ?C)  ?SpO2: 96% 97%  ? Skin clean, dry and intact without evidence of skin break down, no evidence of skin tears noted. ?IV catheter discontinued intact.  Site without signs and symptoms of complications - no redness or edema noted at insertion site, patient denies c/o pain - only slight tenderness at site.  Dressing with slight pressure applied. ? ?D/c Instructions-Education: ?Discharge instructions given to patient/family with verbalized understanding. ?D/c education completed with patient/family including follow up instructions, medication list, d/c activities limitations if indicated, with other d/c instructions as indicated by MD - patient able to verbalize understanding, all questions fully answered. ?Patient instructed to return to ED, call 911, or call MD for any changes in condition.  ?Patient escorted via [place], and D/C home via private auto. ? ?Kathie Rhodes, RN ?05/16/2021 2:14 PM  ?

## 2021-05-16 NOTE — Discharge Summary (Signed)
Physician Discharge Summary  ?CHARNESE [name] WLN:[id number] DOB: 1934-09-01 DOA: 05/13/2021 ? ?PCP: Alanson Puls, [facility] ? ?Admit date: 05/13/2021 ?Discharge date: 05/16/2021 ? ?Admitted From: Home ?Disposition: Home ? ?Recommendations for Outpatient Follow-up:  ?Follow up with PCP in 1-2 weeks ?Please obtain BMP/CBC in one week ? ? ?Home Health: ?Equipment/Devices: ? ?Discharge Condition: Stable ?CODE STATUS: DNR ?Diet recommendation: Regular diet ? ?Brief/Interim Summary: ?87 year old female with history of COPD, chronic diastolic congestive heart failure, presents to the hospital with worsening shortness of breath.  Found to have COPD exacerbation.  Admitted for steroids and bronchodilators. ? ?Discharge Diagnoses:  ?Principal Problem: ?  COPD exacerbation ([place]) ?Active Problems: ?  Chronic diastolic CHF (congestive heart failure) ([place]) ?  Depression ?  Hyperlipidemia ? ?COPD with acute exacerbation/acute respiratory failure with hypoxia ?Saturations were 80% on room air at the urgent care center ?Patient was wheezing profusely when she was hospitalized.  She was started on nebulizer treatments, steroids.  Started also on doxycycline. ?She was able to ambulate on room air without any hypoxia at the time of discharge ?COVID-19 and influenza PCR's were negative. ?She was transitioned to prednisone taper, will be continued on bronchodilators at home. ?  ?Chronic diastolic CHF ?No clinical signs of decompensated heart failure.  Echocardiogram from 2020 showed preserved left ventricular systolic function.  Patient being continued on her furosemide. ? ?History of depression and dementia ?Noted to be on Aricept, memantine.  Also noted to be on alprazolam.  Noted to be on Ambien.  Need to be cautious with these medications in elderly patients.  She seems to be tolerating them well. ? ?Hyperlipidemia ?Continue with statin therapy. ?  ?Normocytic anemia ?No evidence of overt bleeding.  Hemoglobin is stable.   ? ?Discharge  Instructions ? ?Discharge Instructions   ? ? Diet - low sodium heart healthy   Complete by: As directed ?  ? Increase activity slowly   Complete by: As directed ?  ? ?  ? ?Allergies as of 05/16/2021   ? ?   Reactions  ? Codeine Shortness Of Breath, Rash  ? Codeine Anaphylaxis  ? Levofloxacin Anaphylaxis  ? Patient was admitted to hospital with lung problems due to this medication  ? Sulfonamide Derivatives Hives, Shortness Of Breath  ? Sulfa Antibiotics Hives  ? ?  ? ?  ?Medication List  ?  ? ?STOP taking these medications   ? ?HYDROcodone-acetaminophen 5-325 MG tablet ?Commonly known as: NORCO/VICODIN ?  ?lidocaine 5 % ointment ?Commonly known as: XYLOCAINE ?  ?linaclotide 145 MCG Caps capsule ?Commonly known as: LINZESS ?  ?meloxicam 7.5 MG tablet ?Commonly known as: MOBIC ?  ?methocarbamol 500 MG tablet ?Commonly known as: ROBAXIN ?  ?tamsulosin 0.4 MG Caps capsule ?Commonly known as: FLOMAX ?  ?tiZANidine 4 MG tablet ?Commonly known as: ZANAFLEX ?  ? ?  ? ?TAKE these medications   ? ?albuterol 108 (90 Base) MCG/ACT inhaler ?Commonly known as: ProAir HFA ?Inhale 2 puffs into the lungs every 4 (four) hours as needed. ?  ?albuterol (2.5 MG/3ML) 0.083% nebulizer solution ?Commonly known as: PROVENTIL ?Take 3 mLs (2.5 mg total) by nebulization every 6 (six) hours as needed for wheezing or shortness of breath. ?  ?ALPRAZolam 0.5 MG tablet ?Commonly known as: Duanne Moron ?Take 0.5 mg by mouth 2 (two) times daily. ?  ?cetirizine 10 MG tablet ?Commonly known as: ZYRTEC ?Take 10 mg by mouth daily as needed for allergies. ?  ?dextromethorphan-guaiFENesin 30-600 MG 12hr tablet ?Commonly known as: [employer]  DM ?Take 1 tablet by mouth 2 (two) times daily. ?  ?diclofenac Sodium 1 % Gel ?Commonly known as: VOLTAREN ?APPLY AS DIRECTED TO THE AFFECTED KNEE FOUR TIMES DAILY. ?What changed: See the new instructions. ?  ?donepezil 10 MG tablet ?Commonly known as: ARICEPT ?Take 10 mg by mouth daily. ?  ?doxycycline 100 MG tablet ?Commonly  known as: VIBRA-TABS ?Take 1 tablet (100 mg total) by mouth every 12 (twelve) hours for 3 days. ?  ?furosemide 40 MG tablet ?Commonly known as: LASIX ?Take 40 mg by mouth daily. ?  ?guaiFENesin 600 MG 12 hr tablet ?Commonly known as: Mucinex ?Take 1 tablet (600 mg total) by mouth 2 (two) times daily. ?  ?memantine 10 MG tablet ?Commonly known as: NAMENDA ?Take 10 mg by mouth 2 (two) times daily. ?  ?multivitamin with minerals Tabs tablet ?Take 1 tablet by mouth daily. ?  ?pantoprazole 40 MG tablet ?Commonly known as: PROTONIX ?TAKE (1) TABLET BY MOUTH TWICE A DAY WITH MEALS (BREAKFAST AND SUPPER) ?What changed: See the new instructions. ?  ?potassium chloride 10 MEQ tablet ?Commonly known as: KLOR-CON ?Take 1 tablet (10 mEq total) by mouth daily. ?  ?predniSONE 10 MG tablet ?Commonly known as: DELTASONE ?Take '40mg'$  po daily for 2 days then '30mg'$  daily for 2 days then '20mg'$  daily for 2 days then '10mg'$  daily for 2 days then stop ?  ?simvastatin 10 MG tablet ?Commonly known as: ZOCOR ?Take 5 mg by mouth daily. ?  ?traMADol 50 MG tablet ?Commonly known as: ULTRAM ?Take 100 mg by mouth every 6 (six) hours as needed for moderate pain. ?What changed: Another medication with the same name was removed. Continue taking this medication, and follow the directions you see here. ?  ?umeclidinium-vilanterol 62.5-25 MCG/INH Aepb ?Commonly known as: ANORO ELLIPTA ?Inhale 1 puff into the lungs daily. ?  ?zolpidem 5 MG tablet ?Commonly known as: AMBIEN ?Take 1 tablet (5 mg total) by mouth at bedtime as needed for up to 13 days for sleep. X 14 days ?What changed:  ?when to take this ?additional instructions ?  ? ?  ? ? ?Allergies  ?Allergen Reactions  ? Codeine Shortness Of Breath and Rash  ? Codeine Anaphylaxis  ? Levofloxacin Anaphylaxis  ?  Patient was admitted to hospital with lung problems due to this medication  ? Sulfonamide Derivatives Hives and Shortness Of Breath  ? Sulfa Antibiotics Hives   ? ? ?Consultations: ? ? ? ?Procedures/Studies: ?DG Chest 2 View ? ?Result Date: 05/13/2021 ?CLINICAL DATA:  Shortness of breath EXAM: CHEST - 2 VIEW COMPARISON:  03/14/2021, 10/18/2018, CT 06/29/2018 FINDINGS: Diffuse bilateral reticular opacity consistent with chronic lung disease. Small pleural effusions or pleural thickening. No consolidation. Stable cardiomediastinal silhouette with large hiatal hernia. Kyphosis of the spine with multiple chronic compression fractures and osteopenia. IMPRESSION: Evidence for chronic lung disease without acute airspace disease. Large hiatal hernia Electronically Signed   By: Donavan Foil M.D.   On: 05/13/2021 21:24   ? ? ? ?Subjective: ?Feeling better.  Continues to have some cough.  Shortness of breath improved.  Wheezing is better. ? ?Discharge Exam: ?Vitals:  ? 05/16/21 0505 05/16/21 0815 05/16/21 3825 05/16/21 1333  ?BP: 122/79   136/79  ?Pulse: 84 83  86  ?Resp: '18 16  18  '$ ?Temp: 97.9 ?F (36.6 ?C)   97.9 ?F (36.6 ?C)  ?TempSrc:    Oral  ?SpO2: 97% 96% 96% 97%  ?Weight:      ?Height:      ? ? ?  General: Pt is alert, awake, not in acute distress ?Cardiovascular: RRR, S1/S2 +, no rubs, no gallops ?Respiratory: CTA bilaterally, no wheezing, no rhonchi ?Abdominal: Soft, NT, ND, bowel sounds + ?Extremities: no edema, no cyanosis ? ? ? ?The results of significant diagnostics from this hospitalization (including imaging, microbiology, ancillary and laboratory) are listed below for reference.   ? ? ?Microbiology: ?Recent Results (from the past 240 hour(s))  ?Resp Panel by RT-PCR (Flu A&B, Covid) Nasopharyngeal Swab     Status: None  ? Collection Time: 05/13/21 11:45 PM  ? Specimen: Nasopharyngeal Swab; Nasopharyngeal(NP) swabs in vial transport medium  ?Result Value Ref Range Status  ? SARS Coronavirus 2 by RT PCR NEGATIVE NEGATIVE Final  ?  Comment: (NOTE) ?SARS-CoV-2 target nucleic acids are NOT DETECTED. ? ?The SARS-CoV-2 RNA is generally detectable in upper respiratory ?specimens  during the acute phase of infection. The lowest ?concentration of SARS-CoV-2 viral copies this assay can detect is ?138 copies/mL. A negative result does not preclude SARS-Cov-2 ?infection and should not be used as the sole basis for treatment

## 2021-06-09 ENCOUNTER — Encounter (HOSPITAL_COMMUNITY): Payer: Self-pay | Admitting: Emergency Medicine

## 2021-06-09 ENCOUNTER — Inpatient Hospital Stay (HOSPITAL_COMMUNITY)
Admission: EM | Admit: 2021-06-09 | Discharge: 2021-06-12 | DRG: 193 | Disposition: A | Discharge status: Home Health | Payer: Medicare Other | Attending: Family Medicine | Admitting: Family Medicine

## 2021-06-09 ENCOUNTER — Other Ambulatory Visit: Payer: Self-pay

## 2021-06-09 DIAGNOSIS — J9601 Acute respiratory failure with hypoxia: Secondary | ICD-10-CM | POA: Diagnosis present

## 2021-06-09 DIAGNOSIS — I5032 Chronic diastolic (congestive) heart failure: Secondary | ICD-10-CM | POA: Diagnosis present

## 2021-06-09 DIAGNOSIS — Z881 Allergy status to other antibiotic agents status: Secondary | ICD-10-CM

## 2021-06-09 DIAGNOSIS — J189 Pneumonia, unspecified organism: Principal | ICD-10-CM | POA: Diagnosis present

## 2021-06-09 DIAGNOSIS — R0902 Hypoxemia: Secondary | ICD-10-CM

## 2021-06-09 DIAGNOSIS — Z882 Allergy status to sulfonamides status: Secondary | ICD-10-CM

## 2021-06-09 DIAGNOSIS — Z7952 Long term (current) use of systemic steroids: Secondary | ICD-10-CM

## 2021-06-09 DIAGNOSIS — H109 Unspecified conjunctivitis: Secondary | ICD-10-CM | POA: Diagnosis present

## 2021-06-09 DIAGNOSIS — F17200 Nicotine dependence, unspecified, uncomplicated: Secondary | ICD-10-CM | POA: Diagnosis present

## 2021-06-09 DIAGNOSIS — Z79899 Other long term (current) drug therapy: Secondary | ICD-10-CM

## 2021-06-09 DIAGNOSIS — R531 Weakness: Principal | ICD-10-CM

## 2021-06-09 DIAGNOSIS — Z833 Family history of diabetes mellitus: Secondary | ICD-10-CM

## 2021-06-09 DIAGNOSIS — Z8249 Family history of ischemic heart disease and other diseases of the circulatory system: Secondary | ICD-10-CM

## 2021-06-09 DIAGNOSIS — J440 Chronic obstructive pulmonary disease with acute lower respiratory infection: Secondary | ICD-10-CM | POA: Diagnosis present

## 2021-06-09 DIAGNOSIS — D72829 Elevated white blood cell count, unspecified: Secondary | ICD-10-CM | POA: Diagnosis present

## 2021-06-09 DIAGNOSIS — D649 Anemia, unspecified: Secondary | ICD-10-CM | POA: Diagnosis present

## 2021-06-09 DIAGNOSIS — Z823 Family history of stroke: Secondary | ICD-10-CM

## 2021-06-09 DIAGNOSIS — F0390 Unspecified dementia without behavioral disturbance: Secondary | ICD-10-CM | POA: Diagnosis present

## 2021-06-09 DIAGNOSIS — E871 Hypo-osmolality and hyponatremia: Secondary | ICD-10-CM | POA: Diagnosis not present

## 2021-06-09 DIAGNOSIS — F419 Anxiety disorder, unspecified: Secondary | ICD-10-CM | POA: Diagnosis present

## 2021-06-09 DIAGNOSIS — K219 Gastro-esophageal reflux disease without esophagitis: Secondary | ICD-10-CM | POA: Diagnosis present

## 2021-06-09 DIAGNOSIS — Z66 Do not resuscitate: Secondary | ICD-10-CM | POA: Diagnosis present

## 2021-06-09 DIAGNOSIS — J8410 Pulmonary fibrosis, unspecified: Secondary | ICD-10-CM | POA: Diagnosis present

## 2021-06-09 DIAGNOSIS — R262 Difficulty in walking, not elsewhere classified: Secondary | ICD-10-CM | POA: Diagnosis present

## 2021-06-09 DIAGNOSIS — F17290 Nicotine dependence, other tobacco product, uncomplicated: Secondary | ICD-10-CM | POA: Diagnosis present

## 2021-06-09 DIAGNOSIS — E785 Hyperlipidemia, unspecified: Secondary | ICD-10-CM | POA: Diagnosis present

## 2021-06-09 DIAGNOSIS — Z885 Allergy status to narcotic agent status: Secondary | ICD-10-CM

## 2021-06-09 NOTE — ED Triage Notes (Signed)
Pt c/o generalized weakness x 2 days. Pt states she is unable to ambulate with walker like usual.

## 2021-06-09 NOTE — ED Provider Notes (Signed)
[facility] EMERGENCY DEPARTMENT Provider Note   CSN: [id number] Arrival date & time: 06/09/21  2306     History {Add pertinent medical, surgical, social history, OB history to HPI:1} Chief Complaint  Patient presents with   Weakness    [name] is a 87 y.o. female.  The history is provided by the patient.  Weakness She has history of hyperlipidemia, diastolic heart failure, COPD and comes in because of being unable to walk today.  She states that everything was fine yesterday.  She normally walks with her walker, but today was not able to ambulate at all.  She does admit to a slight cough which is nonproductive, and some slight dyspnea.  She denies fever, chills, sweats.  She denies nausea, vomiting, diarrhea.  She denies any urinary difficulty.   Home Medications Prior to Admission medications   Medication Sig Start Date End Date Taking? Authorizing Provider  albuterol (PROAIR HFA) 108 (90 Base) MCG/ACT inhaler Inhale 2 puffs into the lungs every 4 (four) hours as needed. 06/23/18   Gerlene Fee, NP  albuterol (PROVENTIL) (2.5 MG/3ML) 0.083% nebulizer solution Take 3 mLs (2.5 mg total) by nebulization every 6 (six) hours as needed for wheezing or shortness of breath. 06/23/18   Gerlene Fee, NP  ALPRAZolam Duanne Moron) 0.5 MG tablet Take 0.5 mg by mouth 2 (two) times daily.    [provider]  cetirizine (ZYRTEC) 10 MG tablet Take 10 mg by mouth daily as needed for allergies. 06/16/18   [provider]  dextromethorphan-guaiFENesin (MUCINEX DM) 30-600 MG 12hr tablet Take 1 tablet by mouth 2 (two) times daily. 05/16/21   Kathie Dike, MD  diclofenac Sodium (VOLTAREN) 1 % GEL APPLY AS DIRECTED TO THE AFFECTED KNEE FOUR TIMES DAILY. Patient taking differently: Apply 4 g topically 4 (four) times daily. 08/04/20   Carole Civil, MD  donepezil (ARICEPT) 10 MG tablet Take 10 mg by mouth daily. 07/25/19   [provider]  furosemide (LASIX) 40 MG tablet  Take 40 mg by mouth daily.    [provider]  guaiFENesin (MUCINEX) 600 MG 12 hr tablet Take 1 tablet (600 mg total) by mouth 2 (two) times daily. 05/16/21 05/16/22  Kathie Dike, MD  memantine (NAMENDA) 10 MG tablet Take 10 mg by mouth 2 (two) times daily.    [provider]  Multiple Vitamin (MULTIVITAMIN WITH MINERALS) TABS tablet Take 1 tablet by mouth daily.    [provider]  pantoprazole (PROTONIX) 40 MG tablet TAKE (1) TABLET BY MOUTH TWICE A DAY WITH MEALS (BREAKFAST AND SUPPER) Patient taking differently: Take 40 mg by mouth daily. 05/24/19   Carlis Stable, NP  potassium chloride (K-DUR) 10 MEQ tablet Take 1 tablet (10 mEq total) by mouth daily. 06/23/18   Gerlene Fee, NP  predniSONE (DELTASONE) 10 MG tablet Take '40mg'$  po daily for 2 days then '30mg'$  daily for 2 days then '20mg'$  daily for 2 days then '10mg'$  daily for 2 days then stop 05/16/21   Kathie Dike, MD  simvastatin (ZOCOR) 10 MG tablet Take 5 mg by mouth daily. 02/27/21   [provider]  traMADol (ULTRAM) 50 MG tablet Take 100 mg by mouth every 6 (six) hours as needed for moderate pain.    [provider]  umeclidinium-vilanterol (ANORO ELLIPTA) 62.5-25 MCG/INH AEPB Inhale 1 puff into the lungs daily. 06/23/18   Gerlene Fee, NP  zolpidem (AMBIEN) 5 MG tablet Take 1 tablet (5 mg total) by  mouth at bedtime as needed for up to 13 days for sleep. X 14 days Patient taking differently: Take 5 mg by mouth at bedtime. 06/23/18 05/13/21  Gerlene Fee, NP      Allergies    Codeine, Codeine, Levofloxacin, Sulfonamide derivatives, and Sulfa antibiotics    Review of Systems   Review of Systems  Neurological:  Positive for weakness.  All other systems reviewed and are negative.  Physical Exam Updated Vital Signs BP 119/74   Pulse 96   Temp 98.5 F (36.9 C) (Oral)   Resp 20   Ht 5' (1.524 m)   Wt 48 kg   SpO2 94%   BMI 20.67 kg/m  Physical Exam Vitals and nursing note  reviewed.  Chronically ill-appearing 87 year old female, resting comfortably and in no acute distress. Vital signs are normal. Oxygen saturation is 94%, which is normal. Head is normocephalic and atraumatic. PERRLA, EOMI. Oropharynx is clear. Neck is nontender and supple without adenopathy or JVD. Back is nontender and there is no CVA tenderness. Lungs are clear without rales, wheezes, or rhonchi. Chest is nontender. Heart has regular rate and rhythm without murmur. Abdomen is soft, flat, nontender. Extremities have no cyanosis or edema, full range of motion is present. Skin is warm and dry without rash. Neurologic: Mental status is normal, cranial nerves are intact.  She has mild to moderate generalized weakness without any focal weakness.  ED Results / Procedures / Treatments   Labs (all labs ordered are listed, but only abnormal results are displayed) Labs Reviewed - No data to display  EKG EKG Interpretation  Date/Time:  Tuesday June 09 2021 23:38:35 EDT Ventricular Rate:  92 PR Interval:  239 QRS Duration: 118 QT Interval:  381 QTC Calculation: 472 R Axis:   85 Text Interpretation: Sinus rhythm Prolonged PR interval Nonspecific intraventricular conduction delay ST elevation, consider inferior injury When compared with ECG of 05/13/2021, No significant change was found Confirmed by Delora Fuel ([id number]) on 06/09/2021 11:42:33 PM  Radiology No results found.  Procedures Procedures  Cardiac monitor shows normal sinus rhythm, per my interpretation.  Medications Ordered in ED Medications - No data to display  ED Course/ Medical Decision Making/ A&P                           Medical Decision Making  Weakness of uncertain cause.  Concern for occult infection.  Consider electrolyte disturbance, dehydration.  I have ordered laboratory tests and place patient on the evolving sepsis pathway.  Chest x-ray has also been ordered.  I have independently viewed and interpreted the ECG,  and it shows nonspecific intraventricular conduction delay and nonspecific ST changes, unchanged from prior ECG.  {Document critical care time when appropriate:1} {Document review of labs and clinical decision tools ie heart score, Chads2Vasc2 etc:1}  {Document your independent review of radiology images, and any outside records:1} {Document your discussion with family members, caretakers, and with consultants:1} {Document social determinants of health affecting pt's care:1} {Document your decision making why or why not admission, treatments were needed:1} Final Clinical Impression(s) / ED Diagnoses Final diagnoses:  None    Rx / DC Orders ED Discharge Orders     None

## 2021-06-10 ENCOUNTER — Emergency Department (HOSPITAL_COMMUNITY): Payer: Medicare Other

## 2021-06-10 DIAGNOSIS — K219 Gastro-esophageal reflux disease without esophagitis: Secondary | ICD-10-CM | POA: Diagnosis present

## 2021-06-10 DIAGNOSIS — E785 Hyperlipidemia, unspecified: Secondary | ICD-10-CM | POA: Diagnosis present

## 2021-06-10 DIAGNOSIS — Z66 Do not resuscitate: Secondary | ICD-10-CM | POA: Diagnosis present

## 2021-06-10 DIAGNOSIS — R531 Weakness: Secondary | ICD-10-CM

## 2021-06-10 DIAGNOSIS — Z79899 Other long term (current) drug therapy: Secondary | ICD-10-CM | POA: Diagnosis not present

## 2021-06-10 DIAGNOSIS — Z823 Family history of stroke: Secondary | ICD-10-CM | POA: Diagnosis not present

## 2021-06-10 DIAGNOSIS — F0390 Unspecified dementia without behavioral disturbance: Secondary | ICD-10-CM | POA: Diagnosis present

## 2021-06-10 DIAGNOSIS — I5032 Chronic diastolic (congestive) heart failure: Secondary | ICD-10-CM | POA: Diagnosis present

## 2021-06-10 DIAGNOSIS — F419 Anxiety disorder, unspecified: Secondary | ICD-10-CM | POA: Diagnosis present

## 2021-06-10 DIAGNOSIS — E871 Hypo-osmolality and hyponatremia: Secondary | ICD-10-CM | POA: Diagnosis present

## 2021-06-10 DIAGNOSIS — H109 Unspecified conjunctivitis: Secondary | ICD-10-CM | POA: Diagnosis present

## 2021-06-10 DIAGNOSIS — R262 Difficulty in walking, not elsewhere classified: Secondary | ICD-10-CM | POA: Diagnosis present

## 2021-06-10 DIAGNOSIS — Z7952 Long term (current) use of systemic steroids: Secondary | ICD-10-CM | POA: Diagnosis not present

## 2021-06-10 DIAGNOSIS — F17290 Nicotine dependence, other tobacco product, uncomplicated: Secondary | ICD-10-CM | POA: Diagnosis present

## 2021-06-10 DIAGNOSIS — J189 Pneumonia, unspecified organism: Secondary | ICD-10-CM | POA: Diagnosis present

## 2021-06-10 DIAGNOSIS — J440 Chronic obstructive pulmonary disease with acute lower respiratory infection: Secondary | ICD-10-CM | POA: Diagnosis present

## 2021-06-10 DIAGNOSIS — Z881 Allergy status to other antibiotic agents status: Secondary | ICD-10-CM | POA: Diagnosis not present

## 2021-06-10 DIAGNOSIS — Z8249 Family history of ischemic heart disease and other diseases of the circulatory system: Secondary | ICD-10-CM | POA: Diagnosis not present

## 2021-06-10 DIAGNOSIS — J9601 Acute respiratory failure with hypoxia: Secondary | ICD-10-CM | POA: Diagnosis present

## 2021-06-10 DIAGNOSIS — Z833 Family history of diabetes mellitus: Secondary | ICD-10-CM | POA: Diagnosis not present

## 2021-06-10 DIAGNOSIS — J8410 Pulmonary fibrosis, unspecified: Secondary | ICD-10-CM | POA: Diagnosis present

## 2021-06-10 DIAGNOSIS — D649 Anemia, unspecified: Secondary | ICD-10-CM | POA: Diagnosis present

## 2021-06-10 DIAGNOSIS — Z882 Allergy status to sulfonamides status: Secondary | ICD-10-CM | POA: Diagnosis not present

## 2021-06-10 DIAGNOSIS — F17200 Nicotine dependence, unspecified, uncomplicated: Secondary | ICD-10-CM | POA: Diagnosis present

## 2021-06-10 DIAGNOSIS — Z885 Allergy status to narcotic agent status: Secondary | ICD-10-CM | POA: Diagnosis not present

## 2021-06-10 LAB — URINALYSIS, ROUTINE W REFLEX MICROSCOPIC
Bilirubin Urine: NEGATIVE
Glucose, UA: NEGATIVE mg/dL
Hgb urine dipstick: NEGATIVE
Ketones, ur: 5 mg/dL — AB
Leukocytes,Ua: NEGATIVE
Nitrite: NEGATIVE
Protein, ur: NEGATIVE mg/dL
Specific Gravity, Urine: 1.014 (ref 1.005–1.030)
pH: 5 (ref 5.0–8.0)

## 2021-06-10 LAB — CBC WITH DIFFERENTIAL/PLATELET
Abs Immature Granulocytes: 0.22 10*3/uL — ABNORMAL HIGH (ref 0.00–0.07)
Basophils Absolute: 0.1 10*3/uL (ref 0.0–0.1)
Basophils Relative: 0 %
Eosinophils Absolute: 0 10*3/uL (ref 0.0–0.5)
Eosinophils Relative: 0 %
HCT: 32.4 % — ABNORMAL LOW (ref 36.0–46.0)
Hemoglobin: 10.4 g/dL — ABNORMAL LOW (ref 12.0–15.0)
Immature Granulocytes: 1 %
Lymphocytes Relative: 6 %
Lymphs Abs: 1.2 10*3/uL (ref 0.7–4.0)
MCH: 28.9 pg (ref 26.0–34.0)
MCHC: 32.1 g/dL (ref 30.0–36.0)
MCV: 90 fL (ref 80.0–100.0)
Monocytes Absolute: 1.8 10*3/uL — ABNORMAL HIGH (ref 0.1–1.0)
Monocytes Relative: 9 %
Neutro Abs: 17.2 10*3/uL — ABNORMAL HIGH (ref 1.7–7.7)
Neutrophils Relative %: 84 %
Platelets: 236 10*3/uL (ref 150–400)
RBC: 3.6 MIL/uL — ABNORMAL LOW (ref 3.87–5.11)
RDW: 14.1 % (ref 11.5–15.5)
WBC: 20.5 10*3/uL — ABNORMAL HIGH (ref 4.0–10.5)
nRBC: 0 % (ref 0.0–0.2)

## 2021-06-10 LAB — COMPREHENSIVE METABOLIC PANEL
ALT: 25 U/L (ref 0–44)
AST: 33 U/L (ref 15–41)
Albumin: 3.6 g/dL (ref 3.5–5.0)
Alkaline Phosphatase: 100 U/L (ref 38–126)
Anion gap: 8 (ref 5–15)
BUN: 21 mg/dL (ref 8–23)
CO2: 25 mmol/L (ref 22–32)
Calcium: 8.7 mg/dL — ABNORMAL LOW (ref 8.9–10.3)
Chloride: 97 mmol/L — ABNORMAL LOW (ref 98–111)
Creatinine, Ser: 1.05 mg/dL — ABNORMAL HIGH (ref 0.44–1.00)
GFR, Estimated: 52 mL/min — ABNORMAL LOW (ref >60)
Glucose, Bld: 110 mg/dL — ABNORMAL HIGH (ref 70–99)
Potassium: 4.2 mmol/L (ref 3.5–5.1)
Sodium: 130 mmol/L — ABNORMAL LOW (ref 135–145)
Total Bilirubin: 1 mg/dL (ref 0.3–1.2)
Total Protein: 7 g/dL (ref 6.5–8.1)

## 2021-06-10 LAB — HIV ANTIBODY (ROUTINE TESTING W REFLEX): HIV Screen 4th Generation wRfx: NONREACTIVE

## 2021-06-10 LAB — TROPONIN I (HIGH SENSITIVITY)
Troponin I (High Sensitivity): 5 ng/L (ref <18)
Troponin I (High Sensitivity): 5 ng/L (ref <18)

## 2021-06-10 LAB — PROTIME-INR
INR: 1.1 (ref 0.8–1.2)
Prothrombin Time: 13.7 seconds (ref 11.4–15.2)

## 2021-06-10 LAB — APTT: aPTT: 32 seconds (ref 24–36)

## 2021-06-10 LAB — STREP PNEUMONIAE URINARY ANTIGEN: Strep Pneumo Urinary Antigen: NEGATIVE

## 2021-06-10 LAB — PROCALCITONIN: Procalcitonin: 0.49 ng/mL

## 2021-06-10 LAB — LACTIC ACID, PLASMA: Lactic Acid, Venous: 1.2 mmol/L (ref 0.5–1.9)

## 2021-06-10 MED ORDER — TRAMADOL HCL 50 MG PO TABS
50.0000 mg | ORAL_TABLET | Freq: Four times a day (QID) | ORAL | Status: DC | PRN
  Administered 2021-06-10 – 2021-06-11 (×2): 50 mg via ORAL
  Filled 2021-06-10 (×2): qty 1

## 2021-06-10 MED ORDER — NEOMYCIN-POLYMYXIN-DEXAMETH 3.5-10000-0.1 OP SUSP
1.0000 [drp] | Freq: Three times a day (TID) | OPHTHALMIC | Status: DC
Start: 2021-06-10 — End: 2021-06-12
  Administered 2021-06-10 – 2021-06-12 (×5): 1 [drp] via OPHTHALMIC
  Filled 2021-06-10 (×2): qty 5

## 2021-06-10 MED ORDER — ZOLPIDEM TARTRATE 5 MG PO TABS
5.0000 mg | ORAL_TABLET | Freq: Every evening | ORAL | Status: DC | PRN
Start: 2021-06-10 — End: 2021-06-12
  Administered 2021-06-10 – 2021-06-11 (×2): 5 mg via ORAL
  Filled 2021-06-10 (×2): qty 1

## 2021-06-10 MED ORDER — ONDANSETRON HCL 4 MG/2ML IJ SOLN: 4.0000 mg | Freq: Four times a day (QID) | INTRAMUSCULAR | Status: DC | PRN

## 2021-06-10 MED ORDER — PANTOPRAZOLE SODIUM 40 MG PO TBEC
40.0000 mg | DELAYED_RELEASE_TABLET | Freq: Every day | ORAL | Status: DC
  Administered 2021-06-10 – 2021-06-12 (×3): 40 mg via ORAL
  Filled 2021-06-10 (×3): qty 1

## 2021-06-10 MED ORDER — SODIUM CHLORIDE 0.9 % IV SOLN
500.0000 mg | Freq: Once | INTRAVENOUS | Status: AC
  Administered 2021-06-10: 500 mg via INTRAVENOUS
  Filled 2021-06-10: qty 5

## 2021-06-10 MED ORDER — HEPARIN SODIUM (PORCINE) 5000 UNIT/ML IJ SOLN
5000.0000 [IU] | Freq: Three times a day (TID) | INTRAMUSCULAR | Status: DC
  Administered 2021-06-10 – 2021-06-12 (×7): 5000 [IU] via SUBCUTANEOUS
  Filled 2021-06-10 (×7): qty 1

## 2021-06-10 MED ORDER — DONEPEZIL HCL 5 MG PO TABS
10.0000 mg | ORAL_TABLET | Freq: Every day | ORAL | Status: DC
  Administered 2021-06-10 – 2021-06-12 (×3): 10 mg via ORAL
  Filled 2021-06-10 (×3): qty 2

## 2021-06-10 MED ORDER — NICOTINE 21 MG/24HR TD PT24
21.0000 mg | MEDICATED_PATCH | Freq: Every day | TRANSDERMAL | Status: DC
  Administered 2021-06-10: 21 mg via TRANSDERMAL
  Filled 2021-06-10 (×3): qty 1

## 2021-06-10 MED ORDER — SODIUM CHLORIDE 0.9 % IV SOLN
500.0000 mg | INTRAVENOUS | Status: DC
  Administered 2021-06-11 – 2021-06-12 (×2): 500 mg via INTRAVENOUS
  Filled 2021-06-10 (×2): qty 5

## 2021-06-10 MED ORDER — SODIUM CHLORIDE 0.9 % IV SOLN
2.0000 g | Freq: Once | INTRAVENOUS | Status: AC
  Administered 2021-06-10: 2 g via INTRAVENOUS
  Filled 2021-06-10: qty 20

## 2021-06-10 MED ORDER — SODIUM CHLORIDE 0.9 % IV SOLN
2.0000 g | INTRAVENOUS | Status: DC
  Administered 2021-06-11 – 2021-06-12 (×2): 2 g via INTRAVENOUS
  Filled 2021-06-10 (×2): qty 20

## 2021-06-10 MED ORDER — UMECLIDINIUM-VILANTEROL 62.5-25 MCG/ACT IN AEPB
1.0000 | INHALATION_SPRAY | Freq: Every day | RESPIRATORY_TRACT | Status: DC
Start: 2021-06-10 — End: 2021-06-12
  Administered 2021-06-11 – 2021-06-12 (×2): 1 via RESPIRATORY_TRACT
  Filled 2021-06-10: qty 14

## 2021-06-10 MED ORDER — SIMVASTATIN 10 MG PO TABS
5.0000 mg | ORAL_TABLET | Freq: Every day | ORAL | Status: DC
  Administered 2021-06-10 – 2021-06-12 (×3): 5 mg via ORAL
  Filled 2021-06-10 (×3): qty 1

## 2021-06-10 MED ORDER — ACETAMINOPHEN 325 MG PO TABS
650.0000 mg | ORAL_TABLET | Freq: Four times a day (QID) | ORAL | Status: DC | PRN
  Administered 2021-06-10 – 2021-06-12 (×3): 650 mg via ORAL
  Filled 2021-06-10 (×3): qty 2

## 2021-06-10 MED ORDER — MEMANTINE HCL 10 MG PO TABS
10.0000 mg | ORAL_TABLET | Freq: Two times a day (BID) | ORAL | Status: DC
  Administered 2021-06-10 – 2021-06-12 (×5): 10 mg via ORAL
  Filled 2021-06-10 (×5): qty 1

## 2021-06-10 MED ORDER — MAGNESIUM SULFATE 2 GM/50ML IV SOLN
2.0000 g | Freq: Once | INTRAVENOUS | Status: AC
  Administered 2021-06-10: 2 g via INTRAVENOUS
  Filled 2021-06-10: qty 50

## 2021-06-10 MED ORDER — FUROSEMIDE 40 MG PO TABS
40.0000 mg | ORAL_TABLET | Freq: Every day | ORAL | Status: DC
  Administered 2021-06-10 – 2021-06-12 (×3): 40 mg via ORAL
  Filled 2021-06-10 (×3): qty 1

## 2021-06-10 MED ORDER — DM-GUAIFENESIN ER 30-600 MG PO TB12
1.0000 | ORAL_TABLET | Freq: Two times a day (BID) | ORAL | Status: DC
  Administered 2021-06-10 – 2021-06-12 (×5): 1 via ORAL
  Filled 2021-06-10 (×5): qty 1

## 2021-06-10 MED ORDER — ALBUTEROL SULFATE (2.5 MG/3ML) 0.083% IN NEBU
2.5000 mg | INHALATION_SOLUTION | Freq: Four times a day (QID) | RESPIRATORY_TRACT | Status: DC | PRN
Start: 2021-06-10 — End: 2021-06-12
  Administered 2021-06-10 – 2021-06-11 (×3): 2.5 mg via RESPIRATORY_TRACT
  Filled 2021-06-10 (×3): qty 3

## 2021-06-10 MED ORDER — SODIUM CHLORIDE 0.9 % IV BOLUS
1000.0000 mL | Freq: Once | INTRAVENOUS | Status: AC
  Administered 2021-06-10: 1000 mL via INTRAVENOUS

## 2021-06-10 MED ORDER — TRAMADOL HCL 50 MG PO TABS: 100.0000 mg | ORAL_TABLET | Freq: Four times a day (QID) | ORAL | Status: DC | PRN

## 2021-06-10 NOTE — Progress Notes (Signed)
ASSUMPTION OF CARE NOTE   06/10/2021 3:48 PM  [name] was seen and examined.  The H&P by the admitting provider, orders, imaging was reviewed.  Please see new orders.  Will continue to follow.  I spoke with daughter and updated her today.   Continue pneumonia treatment and recheck labs in AM.   Vitals:   06/10/21 1426 06/10/21 1427  BP: (!) 102/53   Pulse: 85 85  Resp: 20   Temp: 98.9 F (37.2 C)   SpO2: 91% 94%    Results for orders placed or performed during the hospital encounter of 06/09/21  Blood Culture (routine x 2)   Specimen: BLOOD RIGHT FOREARM  Result Value Ref Range   Specimen Description BLOOD RIGHT FOREARM    Special Requests      BOTTLES DRAWN AEROBIC AND ANAEROBIC Blood Culture adequate volume   Culture      NO GROWTH < 12 HOURS Performed at [facility], [address], [place], [place] [address]    Report Status PENDING   Blood Culture (routine x 2)   Specimen: Left Antecubital; Blood  Result Value Ref Range   Specimen Description LEFT ANTECUBITAL    Special Requests      BOTTLES DRAWN AEROBIC AND ANAEROBIC Blood Culture adequate volume   Culture      NO GROWTH < 12 HOURS Performed at [facility], [address] [place], [place] [address]    Report Status PENDING   Lactic acid, plasma  Result Value Ref Range   Lactic Acid, Venous 1.2 0.5 - 1.9 mmol/L  Comprehensive metabolic panel  Result Value Ref Range   Sodium 130 (L) 135 - 145 mmol/L   Potassium 4.2 3.5 - 5.1 mmol/L   Chloride 97 (L) 98 - 111 mmol/L   CO2 25 22 - 32 mmol/L   Glucose, Bld 110 (H) 70 - 99 mg/dL   BUN 21 8 - 23 mg/dL   Creatinine, Ser 1.05 (H) 0.44 - 1.00 mg/dL   Calcium 8.7 (L) 8.9 - 10.3 mg/dL   Total Protein 7.0 6.5 - 8.1 g/dL   Albumin 3.6 3.5 - 5.0 g/dL   AST 33 15 - 41 U/L   ALT 25 0 - 44 U/L   Alkaline Phosphatase 100 38 - 126 U/L   Total Bilirubin 1.0 0.3 - 1.2 mg/dL   GFR, Estimated 52 (L) >60 mL/min   Anion gap 8 5 - 15  CBC with Differential  Result  Value Ref Range   WBC 20.5 (H) 4.0 - 10.5 K/uL   RBC 3.60 (L) 3.87 - 5.11 MIL/uL   Hemoglobin 10.4 (L) 12.0 - 15.0 g/dL   HCT 32.4 (L) 36.0 - 46.0 %   MCV 90.0 80.0 - 100.0 fL   MCH 28.9 26.0 - 34.0 pg   MCHC 32.1 30.0 - 36.0 g/dL   RDW 14.1 11.5 - 15.5 %   Platelets 236 150 - 400 K/uL   nRBC 0.0 0.0 - 0.2 %   Neutrophils Relative % 84 %   Neutro Abs 17.2 (H) 1.7 - 7.7 K/uL   Lymphocytes Relative 6 %   Lymphs Abs 1.2 0.7 - 4.0 K/uL   Monocytes Relative 9 %   Monocytes Absolute 1.8 (H) 0.1 - 1.0 K/uL   Eosinophils Relative 0 %   Eosinophils Absolute 0.0 0.0 - 0.5 K/uL   Basophils Relative 0 %   Basophils Absolute 0.1 0.0 - 0.1 K/uL   Immature Granulocytes 1 %   Abs Immature  Granulocytes 0.22 (H) 0.00 - 0.07 K/uL  Protime-INR  Result Value Ref Range   Prothrombin Time 13.7 11.4 - 15.2 seconds   INR 1.1 0.8 - 1.2  APTT  Result Value Ref Range   aPTT 32 24 - 36 seconds  Urinalysis, Routine w reflex microscopic Urine, In & Out Cath  Result Value Ref Range   Color, Urine YELLOW YELLOW   APPearance CLEAR CLEAR   Specific Gravity, Urine 1.014 1.005 - 1.030   pH 5.0 5.0 - 8.0   Glucose, UA NEGATIVE NEGATIVE mg/dL   Hgb urine dipstick NEGATIVE NEGATIVE   Bilirubin Urine NEGATIVE NEGATIVE   Ketones, ur 5 (A) NEGATIVE mg/dL   Protein, ur NEGATIVE NEGATIVE mg/dL   Nitrite NEGATIVE NEGATIVE   Leukocytes,Ua NEGATIVE NEGATIVE  Procalcitonin - Baseline  Result Value Ref Range   Procalcitonin 0.49 ng/mL  HIV Antibody (routine testing w rflx)  Result Value Ref Range   HIV Screen 4th Generation wRfx Non Reactive Non Reactive  Troponin I (High Sensitivity)  Result Value Ref Range   Troponin I (High Sensitivity) 5 <18 ng/L  Troponin I (High Sensitivity)  Result Value Ref Range   Troponin I (High Sensitivity) 5 <18 ng/L    C. Wynetta Emery, MD [facility]   06/09/2021 11:27 PM How to contact the [facility] Attending or Consulting provider [place] or covering provider during after hours  [place], for this patient?  Check the care team in [facility] and look for a) attending/consulting [facility] provider listed and b) the [facility] team listed Log into [email or web address] and use [place]'s universal password to access. If you do not have the password, please contact the hospital operator. Locate the [facility] provider you are looking for under [facility] and page to a number that you can be directly reached. If you still have difficulty reaching the provider, please page the [facility] (Director on Call) for the Hospitalists listed on amion for assistance.

## 2021-06-10 NOTE — Evaluation (Signed)
Physical Therapy Evaluation Patient Details Name: [name] MRN: [id number] DOB: June 24, 1934 Today's Date: 06/10/2021  History of Present Illness  [name] is a 87 y.o. female with medical history significant of anxiety, chronic diastolic heart failure, chronic obstructive pulmonary disease, GERD, hyperlipidemia, and more presents the ED with a chief complaint of generalized weakness.  Patient reports that she is not able to walk today.  She ambulates with a walker at baseline.  Today she cannot ambulate at all.  She reports to me that she felt short of breath and had a cough, but does not know when they started.  At first prior to the exam she does not even know that she is in the hospital, but once reoriented she does note that she had been short of breath.  She denies fever and chills.  She denies chest pains, palpitations, nausea, vomiting, diarrhea, dysuria.  Patient reports that she does not wear oxygen at home.  She denies peripheral edema but in fact does have peripheral edema.  No further history could be obtained at this time.  It is unclear patient is disoriented because I just woke her up, or if this is her baseline mentation with her dementia.   Clinical Impression  Patient demonstrates slightly labored movement for sitting up at bedside with Min guard assist, required verbal cueing for proper hand placement during sit to stands with fair/good carryover, slow labored cadence during ambulation without loss of balance and limited mostly due to fatigue and SOB with SpO2 dropping from 93% to 85% while on room air and recovered quickly to 94% after sitting in chair while on room air - nurse notified.  Patient tolerated sitting up in chair after therapy.  Patient will benefit from continued skilled physical therapy in hospital and recommended venue below to increase strength, balance, endurance for safe ADLs and gait.         Recommendations for follow up therapy are one component of a  multi-disciplinary discharge planning process, led by the attending physician.  Recommendations may be updated based on patient status, additional functional criteria and insurance authorization.  Follow Up Recommendations Home health PT    Assistance Recommended at Discharge Set up Supervision/Assistance  Patient can return home with the following  A little help with walking and/or transfers;A little help with bathing/dressing/bathroom;Help with stairs or ramp for entrance;Assistance with cooking/housework    Equipment Recommendations None recommended by PT  Recommendations for Other Services       Functional Status Assessment Patient has had a recent decline in their functional status and demonstrates the ability to make significant improvements in function in a reasonable and predictable amount of time.     Precautions / Restrictions Precautions Precautions: Fall Restrictions Weight Bearing Restrictions: No      Mobility  Bed Mobility Overal bed mobility: Needs Assistance Bed Mobility: Supine to Sit     Supine to sit: Min guard, Min assist     General bed mobility comments: increased time, labored movement    Transfers Overall transfer level: Needs assistance Equipment used: Rolling walker (2 wheels) Transfers: Sit to/from Stand, Bed to chair/wheelchair/BSC Sit to Stand: Min guard, Min assist   Step pivot transfers: Min guard, Min assist       General transfer comment: requires verbal/tactile cueing and Min guard/Min assist for completing sit to stands    Ambulation/Gait Ambulation/Gait assistance: Min assist, Min guard Gait Distance (Feet): 22 Feet Assistive device: Rolling walker (2 wheels) Gait Pattern/deviations: Decreased step  length - right, Decreased step length - left, Decreased stride length, Trunk flexed Gait velocity: decreased     General Gait Details: slow labord cadence with severely flexed trunk due to kyphosis, no loss of balance, limited  moslty due to fatigue, on room air with SpO2 dropping from 93% to 85%  Stairs            Wheelchair Mobility    Modified Rankin (Stroke Patients Only)       Balance Overall balance assessment: Needs assistance Sitting-balance support: Feet supported, No upper extremity supported Sitting balance-Leahy Scale: Fair Sitting balance - Comments: fair/good seated at EOB   Standing balance support: During functional activity, Bilateral upper extremity supported Standing balance-Leahy Scale: Fair Standing balance comment: using RW                             Pertinent Vitals/Pain Pain Assessment Pain Assessment: Faces Faces Pain Scale: Hurts little more Pain Location: posterior right knee Pain Descriptors / Indicators: Sore, Grimacing, Guarding Pain Intervention(s): Limited activity within patient's tolerance, Monitored during session, Repositioned    Home Living Family/patient expects to be discharged to:: Private residence Living Arrangements: Children Available Help at Discharge: Personal care attendant;Family;Available 24 hours/day Type of Home: Mobile home Home Access: Ramped entrance       Home Layout: One level Home Equipment: Shower seat;Hand held shower head;Grab bars - tub/shower;Grab bars - toilet;Rolling Walker (2 wheels);Hospital bed;Wheelchair - manual;BSC/3in1      Prior Function Prior Level of Function : Needs assist       Physical Assist : Mobility (physical);ADLs (physical) Mobility (physical): Bed mobility;Transfers;Gait;Stairs   Mobility Comments: household distances with RW and aide present if assistance needed ADLs Comments: has home aides 8 hours/day x 5 days/week, family assist also     Hand Dominance   Dominant Hand: Right    Extremity/Trunk Assessment   Upper Extremity Assessment Upper Extremity Assessment: Generalized weakness    Lower Extremity Assessment Lower Extremity Assessment: Generalized weakness     Cervical / Trunk Assessment Cervical / Trunk Assessment: Kyphotic  Communication   Communication: No difficulties  Cognition Arousal/Alertness: Awake/alert Behavior During Therapy: WFL for tasks assessed/performed Overall Cognitive Status: Within Functional Limits for tasks assessed                                          General Comments      Exercises     Assessment/Plan    PT Assessment Patient needs continued PT services  PT Problem List Decreased strength;Decreased activity tolerance;Decreased balance;Decreased mobility       PT Treatment Interventions DME instruction;Gait training;Stair training;Functional mobility training;Therapeutic activities;Therapeutic exercise;Patient/family education;Balance training    PT Goals (Current goals can be found in the Care Plan section)  Acute Rehab PT Goals Patient Stated Goal: return home with home aides and family to assist PT Goal Formulation: With patient Time For Goal Achievement: 06/17/21 Potential to Achieve Goals: Good    Frequency Min 3X/week     Co-evaluation               AM-PAC PT "6 Clicks" Mobility  Outcome Measure Help needed turning from your back to your side while in a flat bed without using bedrails?: None Help needed moving from lying on your back to sitting on the side of a flat bed without using  bedrails?: A Little Help needed moving to and from a bed to a chair (including a wheelchair)?: A Little Help needed standing up from a chair using your arms (e.g., wheelchair or bedside chair)?: A Little Help needed to walk in hospital room?: A Little Help needed climbing 3-5 steps with a railing? : A Lot 6 Click Score: 18    End of Session   Activity Tolerance: Patient tolerated treatment well;Patient limited by fatigue Patient left: in chair;with call bell/phone within reach Nurse Communication: Mobility status PT Visit Diagnosis: Unsteadiness on feet (R26.81);Other  abnormalities of gait and mobility (R26.89);Muscle weakness (generalized) (M62.81)    Time: 5498-2641 PT Time Calculation (min) (ACUTE ONLY): 24 min   Charges:   PT Evaluation $PT Eval Moderate Complexity: 1 Mod PT Treatments $Therapeutic Activity: 23-37 mins        12:23 PM, 06/10/21 Lonell Grandchild, MPT Physical Therapist with [facility] [phone] office [phone] mobile phone

## 2021-06-10 NOTE — Plan of Care (Signed)
Admitted to room 319.

## 2021-06-10 NOTE — Hospital Course (Signed)
87 y.o. female with medical history significant of anxiety, chronic diastolic heart failure, chronic obstructive pulmonary disease, GERD, hyperlipidemia, and more presents the ED with a chief complaint of generalized weakness.  Patient reports that she is not able to walk today.  She ambulates with a walker at baseline.  Today she cannot ambulate at all.  She reports to me that she felt short of breath and had a cough, but does not know when they started.  At first prior to the exam she does not even know that she is in the hospital, but once reoriented she does note that she had been short of breath.  She denies fever and chills.  She denies chest pains, palpitations, nausea, vomiting, diarrhea, dysuria.  Patient reports that she does not wear oxygen at home.  She denies peripheral edema but in fact does have peripheral edema.  No further history could be obtained at this time.  It is unclear patient is disoriented because I just woke her up, or if this is her baseline mentation with her dementia.

## 2021-06-10 NOTE — Assessment & Plan Note (Addendum)
-   Patient reports using dip -Nicotine patch ordered -Pt strongly advised she needs to stop all tobacco use

## 2021-06-10 NOTE — Assessment & Plan Note (Signed)
Continue Zocor 

## 2021-06-10 NOTE — TOC Progression Note (Signed)
  Transition of Care [facility]) Screening Note   Patient Details  Name: [name] Date of Birth: 04/25/1934   Transition of Care [name]) CM/SW Contact:    Boneta Lucks, RN Phone Number: 06/10/2021, 3:28 PM    Transition of Care Department [facility]) has reviewed patient and no TOC needs have been identified at this time. We will continue to monitor patient advancement through interdisciplinary progression rounds. If new patient transition needs arise, please place a TOC consult.     Barriers to Discharge: Continued Medical Work up

## 2021-06-10 NOTE — Assessment & Plan Note (Addendum)
-   Diagnostic uncertainty, procalcitonin pending -Chest x-ray is with interstitial lung changes (there is a history of pulmonary fibrosis in her chart) that could be hiding a pneumonia -Leukocytosis 20.5, generalized weakness, hypoxia requiring 2 L nasal cannula -Rocephin and Zithromax IV treated with good results -Blood cultures drawn - no growth to date -pt is clinically much improved and asking to go home, her WBC is normal and her Vitals are stable.  Pt is on room air.  -DC home today to complete abx orally for next 3 days

## 2021-06-10 NOTE — H&P (Signed)
History and Physical    Patient: [name] WUJ:[id number] DOB: 03-Oct-1934 DOA: 06/09/2021 DOS: the patient was seen and examined on 06/10/2021 PCP: [facility]  Patient coming from: Home  Chief Complaint:  Chief Complaint  Patient presents with   Weakness   HPI: [name] is a 87 y.o. female with medical history significant of anxiety, chronic diastolic heart failure, chronic obstructive pulmonary disease, GERD, hyperlipidemia, and more presents the ED with a chief complaint of generalized weakness.  Patient reports that she is not able to walk today.  She ambulates with a walker at baseline.  Today she cannot ambulate at all.  She reports to me that she felt short of breath and had a cough, but does not know when they started.  At first prior to the exam she does not even know that she is in the hospital, but once reoriented she does note that she had been short of breath.  She denies fever and chills.  She denies chest pains, palpitations, nausea, vomiting, diarrhea, dysuria.  Patient reports that she does not wear oxygen at home.  She denies peripheral edema but in fact does have peripheral edema.  No further history could be obtained at this time.  It is unclear patient is disoriented because I just woke her up, or if this is her baseline mentation with her dementia.  Patient reports that she does dip.  ED provider confirmed with family that patient is DNR. Review of Systems: As mentioned in the history of present illness. All other systems reviewed and are negative. Past Medical History:  Diagnosis Date   Anxiety    Chronic diastolic (congestive) heart failure (HCC)    Chronic obstructive pulmonary disease, unspecified (HCC)    COPD (chronic obstructive pulmonary disease) (HCC)    GERD (gastroesophageal reflux disease)    Hyperlipidemia    IBS (irritable bowel syndrome)    Lumbago with sciatica    Osteoporosis    Overactive bladder    Pneumonia    Past Surgical  History:  Procedure Laterality Date   COLONOSCOPY  10/2009   sigmoid diverticula, multiple tubulovillous adneomas, needs surveillance Oct 2013   ESOPHAGOGASTRODUODENOSCOPY  04/16/2011   Dr. Gala Romney: Noncritical Schatzkis ring( not manipulated because no dysphagia). Normal esophagus otherwise  large hiatal hernia. Gastric polyp-status post biopsy. Gastric erosions-staus post biopsy. Abnormal bulb-status post biopsy. benign small bowel, stomach biopsy with ulcerated gastric antral mucosa with foveolar hyperplasia and surface erosion, polyp with inflamed gastric antral mucosa    ESOPHAGOGASTRODUODENOSCOPY N/A 04/29/2014   Dr. Gala Romney: Noncritical Schatzki's ring large hiatal hernia. Retained gastric contents.GES with slight delay   HIP PINNING,CANNULATED Right 04/06/2018   Procedure: CANNULATED HIP PINNING;  Surgeon: Carole Civil, MD;  Location: [facility];  Service: Orthopedics;  Laterality: Right;   Social History:  reports that she has quit smoking. Her smoking use included cigarettes. She has been exposed to tobacco smoke. Her smokeless tobacco use includes snuff. She reports that she does not drink alcohol and does not use drugs.  Allergies  Allergen Reactions   Codeine Shortness Of Breath and Rash   Codeine Anaphylaxis   Levofloxacin Anaphylaxis    Patient was admitted to hospital with lung problems due to this medication   Sulfonamide Derivatives Hives and Shortness Of Breath   Sulfa Antibiotics Hives    Family History  Problem Relation Age of Onset   Stroke Mother    Diabetes Mother    Heart disease Father  Colon cancer Neg Hx     Prior to Admission medications   Medication Sig Start Date End Date Taking? Authorizing Provider  albuterol (PROAIR HFA) 108 (90 Base) MCG/ACT inhaler Inhale 2 puffs into the lungs every 4 (four) hours as needed. 06/23/18   Gerlene Fee, NP  albuterol (PROVENTIL) (2.5 MG/3ML) 0.083% nebulizer solution Take 3 mLs (2.5 mg total) by nebulization every 6  (six) hours as needed for wheezing or shortness of breath. 06/23/18   Gerlene Fee, NP  ALPRAZolam Duanne Moron) 0.5 MG tablet Take 0.5 mg by mouth 2 (two) times daily.    [provider]  cetirizine (ZYRTEC) 10 MG tablet Take 10 mg by mouth daily as needed for allergies. 06/16/18   [provider]  dextromethorphan-guaiFENesin (MUCINEX DM) 30-600 MG 12hr tablet Take 1 tablet by mouth 2 (two) times daily. 05/16/21   Kathie Dike, MD  diclofenac Sodium (VOLTAREN) 1 % GEL APPLY AS DIRECTED TO THE AFFECTED KNEE FOUR TIMES DAILY. Patient taking differently: Apply 4 g topically 4 (four) times daily. 08/04/20   Carole Civil, MD  donepezil (ARICEPT) 10 MG tablet Take 10 mg by mouth daily. 07/25/19   [provider]  furosemide (LASIX) 40 MG tablet Take 40 mg by mouth daily.    [provider]  guaiFENesin (MUCINEX) 600 MG 12 hr tablet Take 1 tablet (600 mg total) by mouth 2 (two) times daily. 05/16/21 05/16/22  Kathie Dike, MD  memantine (NAMENDA) 10 MG tablet Take 10 mg by mouth 2 (two) times daily.    [provider]  Multiple Vitamin (MULTIVITAMIN WITH MINERALS) TABS tablet Take 1 tablet by mouth daily.    [provider]  pantoprazole (PROTONIX) 40 MG tablet TAKE (1) TABLET BY MOUTH TWICE A DAY WITH MEALS (BREAKFAST AND SUPPER) Patient taking differently: Take 40 mg by mouth daily. 05/24/19   Carlis Stable, NP  potassium chloride (K-DUR) 10 MEQ tablet Take 1 tablet (10 mEq total) by mouth daily. 06/23/18   Gerlene Fee, NP  predniSONE (DELTASONE) 10 MG tablet Take '40mg'$  po daily for 2 days then '30mg'$  daily for 2 days then '20mg'$  daily for 2 days then '10mg'$  daily for 2 days then stop 05/16/21   Kathie Dike, MD  simvastatin (ZOCOR) 10 MG tablet Take 5 mg by mouth daily. 02/27/21   [provider]  traMADol (ULTRAM) 50 MG tablet Take 100 mg by mouth every 6 (six) hours as needed for moderate pain.    [provider]   umeclidinium-vilanterol (ANORO ELLIPTA) 62.5-25 MCG/INH AEPB Inhale 1 puff into the lungs daily. 06/23/18   Gerlene Fee, NP  zolpidem (AMBIEN) 5 MG tablet Take 1 tablet (5 mg total) by mouth at bedtime as needed for up to 13 days for sleep. X 14 days Patient taking differently: Take 5 mg by mouth at bedtime. 06/23/18 05/13/21  Gerlene Fee, NP    Physical Exam: Vitals:   06/10/21 0300 06/10/21 0430 06/10/21 0500 06/10/21 0530  BP: (!) 93/56 118/69 (!) 104/91 (!) 115/57  Pulse: 85 88 86 88  Resp: '13 14 14 15  '$ Temp:      TempSrc:      SpO2: 98% 100% 100% 100%  Weight:      Height:       1.  General: Patient lying supine in bed,  no acute distress   2. Psychiatric: Awake and oriented to self, mood and behavior normal for situation, pleasant and cooperative with exam  3. Neurologic: Speech and language are normal, face is symmetric, moves all 4 extremities voluntarily, at baseline without acute deficits on limited exam   4. HEENMT:  Very hard of hearing, head is atraumatic, normocephalic, pupils reactive to light, neck is cachectic, on left posterior neck is a large mass patient reports is chronic, the borders are visible cauliflower but the mass is soft, trachea is midline, mucous membranes are moist   5. Respiratory : Lungs are clear to auscultation bilaterally without wheezing, rhonchi, rales, no cyanosis, no increase in work of breathing or accessory muscle use   6. Cardiovascular : Heart rate normal, rhythm is regular, no murmurs, rubs or gallops, peripheral edema present, peripheral pulses palpated   7. Gastrointestinal:  Abdomen is soft, nondistended, nontender to palpation bowel sounds active, no masses or organomegaly palpated   8. Skin:  Skin is warm, dry and intact without rashes, acute lesions, or ulcers on limited exam   9.Musculoskeletal:  No acute deformities or trauma, no asymmetry in tone, peripheral edema present, peripheral pulses palpated, no  tenderness to palpation in the extremities  Data Reviewed: In the ED Temp 98.5, heart rate 85-96, respiratory rate 13-20, blood pressure 93/56-130/74, satting at 98% on 2 L nasal cannula Leukocytosis 20.5, hemoglobin stable 10.4, platelets 236 Chemistry is unremarkable UA is negative Blood cultures drawn Chest x-ray shows chronic interstitial changes with stable hiatal hernia EKG shows heart rate 92, sinus rhythm, QTc 472 with a lot of artifact at baseline, but possible nonspecific ST depression Troponin added to ED blood work shows 5, repeat trending Rocephin and Zithromax started in the ED Provider confirmed with family that patient is DNR Admission requested for generalized weakness, dyspnea, and possible pneumonia  Assessment and Plan: * CAP (community acquired pneumonia) - Diagnostic uncertainty, procalcitonin pending -Chest x-ray is with interstitial lung changes (there is a history of pulmonary fibrosis in her chart) that could be hiding a pneumonia -Leukocytosis 20.5, generalized weakness, hypoxia requiring 2 L nasal cannula -Rocephin and Zithromax started in the ED -Blood cultures drawn -Get sputum cultures, urine Legionella and strep antigens -Continue Rocephin and Zithromax -Pneumonia order set utilized -Continue to monitor  Hyperlipidemia Continue Zocor  Generalized weakness - Most likely secondary to pneumonia and hypoxia -Checking TSH in the a.m. for completeness -PT eval and treat  Tobacco use disorder - Patient reports using dip -Nicotine patch ordered  Acute respiratory failure with hypoxia (HCC) No oxygen at baseline Requiring 2 L nasal cannula here -Making differential at this time is pneumonia the differential also includes CHF exacerbation, COPD exacerbation -ACS is less suspicious given that patient has not complained of chest pain and troponin is 5 -Lactic acid is 1.2 -Continue to monitor  Dementia ([place]) Continue Namenda  GERD  (gastroesophageal reflux disease) Continue Protonix      Advance Care Planning:   Code Status: DNR   Consults: PT  Family Communication: No family at bedside  Severity of Illness: The appropriate patient status for this patient is OBSERVATION. Observation status is judged to be reasonable and necessary in order to provide the required intensity of service to ensure the patient's safety. The patient's presenting symptoms, physical exam findings, and initial radiographic and laboratory data in the context of their medical condition is felt to place them at decreased risk for further clinical deterioration. Furthermore, it is anticipated that the patient will be medically stable for discharge from the hospital within 2 midnights of admission.   Author: Rolla Plate, DO 06/10/2021 5:35 AM  For on call review [email or web address].

## 2021-06-10 NOTE — Assessment & Plan Note (Addendum)
RESOLVED NOW.  PT is on ROOM AIR

## 2021-06-10 NOTE — Assessment & Plan Note (Addendum)
Protonix used in hospital

## 2021-06-10 NOTE — Plan of Care (Signed)
  Problem: Acute Rehab PT Goals(only PT should resolve) Goal: Pt Will Go Supine/Side To Sit Outcome: Progressing Flowsheets (Taken 06/10/2021 1225) Pt will go Supine/Side to Sit:  with modified independence  with supervision Goal: Patient Will Transfer Sit To/From Stand Outcome: Progressing Flowsheets (Taken 06/10/2021 1225) Patient will transfer sit to/from stand:  with modified independence  with supervision Goal: Pt Will Transfer Bed To Chair/Chair To Bed Outcome: Progressing Flowsheets (Taken 06/10/2021 1225) Pt will Transfer Bed to Chair/Chair to Bed:  min guard assist  with supervision Goal: Pt Will Ambulate Outcome: Progressing Flowsheets (Taken 06/10/2021 1225) Pt will Ambulate:  50 feet  with min guard assist  with rolling walker   12:25 PM, 06/10/21 Lonell Grandchild, MPT Physical Therapist with [facility] [phone] office [phone] mobile phone

## 2021-06-10 NOTE — Assessment & Plan Note (Signed)
Continue Namenda

## 2021-06-10 NOTE — Assessment & Plan Note (Addendum)
-   Most likely secondary to pneumonia and hypoxia which has resolved now -TSH within normal limits  -PT eval and treat - recommending Home health PT which is ordered

## 2021-06-11 DIAGNOSIS — J189 Pneumonia, unspecified organism: Secondary | ICD-10-CM | POA: Diagnosis not present

## 2021-06-11 DIAGNOSIS — R531 Weakness: Secondary | ICD-10-CM | POA: Diagnosis not present

## 2021-06-11 DIAGNOSIS — J9601 Acute respiratory failure with hypoxia: Secondary | ICD-10-CM | POA: Diagnosis not present

## 2021-06-11 LAB — CBC WITH DIFFERENTIAL/PLATELET
Abs Immature Granulocytes: 0.04 10*3/uL (ref 0.00–0.07)
Basophils Absolute: 0.1 10*3/uL (ref 0.0–0.1)
Basophils Relative: 1 %
Eosinophils Absolute: 0.1 10*3/uL (ref 0.0–0.5)
Eosinophils Relative: 1 %
HCT: 33 % — ABNORMAL LOW (ref 36.0–46.0)
Hemoglobin: 10.1 g/dL — ABNORMAL LOW (ref 12.0–15.0)
Immature Granulocytes: 0 %
Lymphocytes Relative: 12 %
Lymphs Abs: 1.3 10*3/uL (ref 0.7–4.0)
MCH: 28.1 pg (ref 26.0–34.0)
MCHC: 30.6 g/dL (ref 30.0–36.0)
MCV: 91.9 fL (ref 80.0–100.0)
Monocytes Absolute: 1.2 10*3/uL — ABNORMAL HIGH (ref 0.1–1.0)
Monocytes Relative: 11 %
Neutro Abs: 8.3 10*3/uL — ABNORMAL HIGH (ref 1.7–7.7)
Neutrophils Relative %: 75 %
Platelets: 217 10*3/uL (ref 150–400)
RBC: 3.59 MIL/uL — ABNORMAL LOW (ref 3.87–5.11)
RDW: 14 % (ref 11.5–15.5)
WBC: 10.9 10*3/uL — ABNORMAL HIGH (ref 4.0–10.5)
nRBC: 0 % (ref 0.0–0.2)

## 2021-06-11 LAB — COMPREHENSIVE METABOLIC PANEL
ALT: 27 U/L (ref 0–44)
AST: 26 U/L (ref 15–41)
Albumin: 3.1 g/dL — ABNORMAL LOW (ref 3.5–5.0)
Alkaline Phosphatase: 106 U/L (ref 38–126)
Anion gap: 10 (ref 5–15)
BUN: 17 mg/dL (ref 8–23)
CO2: 26 mmol/L (ref 22–32)
Calcium: 8.5 mg/dL — ABNORMAL LOW (ref 8.9–10.3)
Chloride: 101 mmol/L (ref 98–111)
Creatinine, Ser: 0.94 mg/dL (ref 0.44–1.00)
GFR, Estimated: 60 mL/min — ABNORMAL LOW (ref >60)
Glucose, Bld: 81 mg/dL (ref 70–99)
Potassium: 3.7 mmol/L (ref 3.5–5.1)
Sodium: 137 mmol/L (ref 135–145)
Total Bilirubin: 0.6 mg/dL (ref 0.3–1.2)
Total Protein: 6.3 g/dL — ABNORMAL LOW (ref 6.5–8.1)

## 2021-06-11 LAB — PROCALCITONIN: Procalcitonin: 0.47 ng/mL

## 2021-06-11 LAB — MAGNESIUM: Magnesium: 2.6 mg/dL — ABNORMAL HIGH (ref 1.7–2.4)

## 2021-06-11 LAB — TSH: TSH: 3.109 u[IU]/mL (ref 0.350–4.500)

## 2021-06-11 MED ORDER — ALPRAZOLAM 0.5 MG PO TABS
0.5000 mg | ORAL_TABLET | Freq: Two times a day (BID) | ORAL | Status: DC
  Administered 2021-06-11 – 2021-06-12 (×3): 0.5 mg via ORAL
  Filled 2021-06-11 (×3): qty 1

## 2021-06-11 MED ORDER — POTASSIUM CHLORIDE CRYS ER 10 MEQ PO TBCR
10.0000 meq | EXTENDED_RELEASE_TABLET | Freq: Every day | ORAL | Status: DC
  Administered 2021-06-12: 10 meq via ORAL
  Filled 2021-06-11: qty 1

## 2021-06-11 MED ORDER — POTASSIUM CHLORIDE CRYS ER 20 MEQ PO TBCR
30.0000 meq | EXTENDED_RELEASE_TABLET | Freq: Once | ORAL | Status: AC
  Administered 2021-06-11: 30 meq via ORAL
  Filled 2021-06-11: qty 1

## 2021-06-11 MED ORDER — LORATADINE 10 MG PO TABS: 10.0000 mg | ORAL_TABLET | Freq: Every day | ORAL | Status: DC | PRN

## 2021-06-11 MED ORDER — ADULT MULTIVITAMIN W/MINERALS CH
1.0000 | ORAL_TABLET | Freq: Every day | ORAL | Status: DC
  Administered 2021-06-11 – 2021-06-12 (×2): 1 via ORAL
  Filled 2021-06-11 (×2): qty 1

## 2021-06-11 MED ORDER — ALBUTEROL SULFATE (2.5 MG/3ML) 0.083% IN NEBU
2.5000 mg | INHALATION_SOLUTION | Freq: Four times a day (QID) | RESPIRATORY_TRACT | Status: DC
  Administered 2021-06-11 – 2021-06-12 (×3): 2.5 mg via RESPIRATORY_TRACT
  Filled 2021-06-11 (×3): qty 3

## 2021-06-11 NOTE — Progress Notes (Signed)
PROGRESS NOTE   [name]  LPF:[id number] DOB: [birth year] DOA: 06/09/2021 PCP: Alanson Puls [name]   Chief Complaint  Patient presents with   Weakness   Level of care: Med-Surg  Brief Admission History:  87 y.o. female with medical history significant of anxiety, chronic diastolic heart failure, chronic obstructive pulmonary disease, GERD, hyperlipidemia, and more presents the ED with a chief complaint of generalized weakness.  Patient reports that she is not able to walk today.  She ambulates with a walker at baseline.  Today she cannot ambulate at all.  She reports to me that she felt short of breath and had a cough, but does not know when they started.  At first prior to the exam she does not even know that she is in the hospital, but once reoriented she does note that she had been short of breath.  She denies fever and chills.  She denies chest pains, palpitations, nausea, vomiting, diarrhea, dysuria.  Patient reports that she does not wear oxygen at home.  She denies peripheral edema but in fact does have peripheral edema.  No further history could be obtained at this time.  It is unclear patient is disoriented because I just woke her up, or if this is her baseline mentation with her dementia.   Assessment and Plan: * CAP (community acquired pneumonia) - Diagnostic uncertainty, procalcitonin pending -Chest x-ray is with interstitial lung changes (there is a history of pulmonary fibrosis in her chart) that could be hiding a pneumonia -Leukocytosis 20.5, generalized weakness, hypoxia requiring 2 L nasal cannula -Rocephin and Zithromax started in the ED -Blood cultures drawn -Get sputum cultures, urine Legionella and strep antigens -Continue Rocephin and Zithromax -Pneumonia order set utilized -Continue to monitor  Leukocytosis -- WBC trending down with treatment.    Generalized weakness - Most likely secondary to pneumonia and hypoxia -Checking TSH in the a.m. for  completeness -PT eval and treat  Tobacco use disorder - Patient reports using dip -Nicotine patch ordered  Acute respiratory failure with hypoxia (HCC) No oxygen at baseline Requiring 2 L nasal cannula here -Making differential at this time is pneumonia the differential also includes CHF exacerbation, COPD exacerbation -ACS is less suspicious given that patient has not complained of chest pain and troponin is 5 -Lactic acid is 1.2 -Continue to monitor  Hyperlipidemia Continue Zocor  Dementia (HCC) Continue Namenda  GERD (gastroesophageal reflux disease) Continue Protonix   DVT prophylaxis:  Code Status:  Family Communication: daughter telephone Disposition: Status is: Inpatient Remains inpatient appropriate because: IV antibiotics    Consultants:   Procedures:   Antimicrobials:    Subjective: Pt reports coughing and chest congestion  Objective: Vitals:   06/11/21 0827 06/11/21 0836 06/11/21 1344 06/11/21 1704  BP:   132/69   Pulse:   80   Resp:   17   Temp:   97.9 F (36.6 C)   TempSrc:   Oral   SpO2: (!) 87% 98% 100% 97%  Weight:      Height:        Intake/Output Summary (Last 24 hours) at 06/11/2021 1856 Last data filed at 06/11/2021 1830 Gross per 24 hour  Intake 720 ml  Output --  Net 720 ml   Filed Weights   06/09/21 2330  Weight: 48 kg   Examination:  General exam: Appears calm and comfortable  Respiratory system: Clear to auscultation. Respiratory effort normal. Cardiovascular system: normal S1 & S2 heard. No JVD, murmurs, rubs, gallops or clicks.  No pedal edema. Gastrointestinal system: Abdomen is nondistended, soft and nontender. No organomegaly or masses felt. Normal bowel sounds heard. Central nervous system: Alert and oriented. No focal neurological deficits. Extremities: Symmetric 5 x 5 power. Skin: No rashes, lesions or ulcers. Psychiatry: Judgement and insight appear normal. Mood & affect appropriate.   Data Reviewed: I have  personally reviewed following labs and imaging studies  CBC: Recent Labs  Lab 06/09/21 2358 06/11/21 0506  WBC 20.5* 10.9*  NEUTROABS 17.2* 8.3*  HGB 10.4* 10.1*  HCT 32.4* 33.0*  MCV 90.0 91.9  PLT 236 932    Basic Metabolic Panel: Recent Labs  Lab 06/09/21 2358 06/11/21 0506  NA 130* 137  K 4.2 3.7  CL 97* 101  CO2 25 26  GLUCOSE 110* 81  BUN 21 17  CREATININE 1.05* 0.94  CALCIUM 8.7* 8.5*  MG  --  2.6*    CBG: No results for input(s): "GLUCAP" in the last 168 hours.  Recent Results (from the past 240 hour(s))  Blood Culture (routine x 2)     Status: None (Preliminary result)   Collection Time: 06/09/21 11:58 PM   Specimen: BLOOD RIGHT FOREARM  Result Value Ref Range Status   Specimen Description BLOOD RIGHT FOREARM  Final   Special Requests   Final    BOTTLES DRAWN AEROBIC AND ANAEROBIC Blood Culture adequate volume   Culture   Final    NO GROWTH 1 DAY Performed at [facility], [address] [place], [place] [id number]    Report Status PENDING  Incomplete  Blood Culture (routine x 2)     Status: None (Preliminary result)   Collection Time: 06/10/21 12:51 AM   Specimen: Left Antecubital; Blood  Result Value Ref Range Status   Specimen Description LEFT ANTECUBITAL  Final   Special Requests   Final    BOTTLES DRAWN AEROBIC AND ANAEROBIC Blood Culture adequate volume   Culture   Final    NO GROWTH 1 DAY Performed at [facility], [address], [place], [place] [address]    Report Status PENDING  Incomplete     Radiology Studies: DG Chest Port 1 View  Result Date: 06/10/2021 CLINICAL DATA:  Generalized weakness and possible sepsis, initial encounter EXAM: PORTABLE CHEST 1 VIEW COMPARISON:  05/13/2021 FINDINGS: Large hiatal hernia is noted. Cardiac shadow is stable. Chronic interstitial changes are again seen. No focal infiltrate or sizable effusion is noted. Old rib fractures are noted bilaterally. No acute bony abnormality is seen. IMPRESSION: Stable  large hiatal hernia. Chronic interstitial changes similar to that noted on the prior exam. Electronically Signed   By: Inez Catalina M.D.   On: 06/10/2021 00:28    Scheduled Meds:  albuterol  2.5 mg Nebulization Q6H   ALPRAZolam  0.5 mg Oral BID   dextromethorphan-guaiFENesin  1 tablet Oral BID   donepezil  10 mg Oral Daily   furosemide  40 mg Oral Daily   heparin  5,000 Units Subcutaneous Q8H   memantine  10 mg Oral BID   multivitamin with minerals  1 tablet Oral Daily   neomycin-polymyxin b-dexamethasone  1 drop Both Eyes TID   nicotine  21 mg Transdermal Daily   pantoprazole  40 mg Oral Daily   [START ON 06/12/2021] potassium chloride  10 mEq Oral Daily   simvastatin  5 mg Oral Daily   umeclidinium-vilanterol  1 puff Inhalation Daily   Continuous Infusions:  azithromycin 500 mg (06/11/21 0305)   cefTRIAXone (ROCEPHIN)  IV 2  g (06/11/21 0452)    LOS: 1 day   Time spent: 35 mins  Carmie Lanpher Wynetta Emery, MD How to contact the [facility] Attending or Consulting provider [place] or covering provider during after hours [place], for this patient?  Check the care team in [facility] and look for a) attending/consulting TR[facility] provider listed and b) the [facility] team listed Log into [email or web address] and use [place]'s universal password to access. If you do not have the password, please contact the hospital operator. Locate the [facility] provider you are looking for under [facility] and page to a number that you can be directly reached. If you still have difficulty reaching the provider, please page the [facility] (Director on Call) for the Hospitalists listed on amion for assistance.  06/11/2021, 6:56 PM

## 2021-06-11 NOTE — Progress Notes (Signed)
Patient has rested well and been alert through shift.  Patient family called earlier in shift and expressed concerns about patient currently not receiving anti-anxiety medication during this admission so far, as she has been on it for years.  Patient at this time is stable, no symptoms of withdrawal. Vital stable, no acute events.

## 2021-06-11 NOTE — Assessment & Plan Note (Addendum)
--   WBC trending down to normal now with treatments.

## 2021-06-11 NOTE — Progress Notes (Signed)
Physical Therapy Treatment Patient Details Name: [name] MRN: [id number] DOB: 11/10/1934 Today's Date: 06/11/2021   History of Present Illness [name] is a 87 y.o. female with medical history significant of anxiety, chronic diastolic heart failure, chronic obstructive pulmonary disease, GERD, hyperlipidemia, and more presents the ED with a chief complaint of generalized weakness.  Patient reports that she is not able to walk today.  She ambulates with a walker at baseline.  Today she cannot ambulate at all.  She reports to me that she felt short of breath and had a cough, but does not know when they started.  At first prior to the exam she does not even know that she is in the hospital, but once reoriented she does note that she had been short of breath.  She denies fever and chills.  She denies chest pains, palpitations, nausea, vomiting, diarrhea, dysuria.  Patient reports that she does not wear oxygen at home.  She denies peripheral edema but in fact does have peripheral edema.  No further history could be obtained at this time.  It is unclear patient is disoriented because I just woke her up, or if this is her baseline mentation with her dementia.    PT Comments    Patient sitting in chair upon therapist arrival. Patient's daughter present throughout session. Patient agreeable to participating in therapy session today. Patient performed therapeutic exercises in seated position and exhibited good endurance for activity. On room air, patient ambulated with slow somewhat labored cadence with severely flexed trunk due to kyphosis. No loss of balance exhibited. Patient limited mostly due to fatigue, During standing and ambulation activities on room air, SpO2 ranged from 97% to 93% and pulse rate range from 77 to 98 bpm.  Patient would continue to benefit from skilled physical therapy in current environment and next venue to continue return to prior function and increase strength, endurance,  balance, coordination, and functional mobility and gait skills.     Recommendations for follow up therapy are one component of a multi-disciplinary discharge planning process, led by the attending physician.  Recommendations may be updated based on patient status, additional functional criteria and insurance authorization.  Follow Up Recommendations  Home health PT     Assistance Recommended at Discharge Set up Supervision/Assistance  Patient can return home with the following A little help with walking and/or transfers;A little help with bathing/dressing/bathroom;Help with stairs or ramp for entrance;Assistance with cooking/housework   Equipment Recommendations  None recommended by PT    Recommendations for Other Services       Precautions / Restrictions Precautions Precautions: Fall Restrictions Weight Bearing Restrictions: No     Mobility  Bed Mobility     General bed mobility comments: patient sitting in chair at beginning and end of session    Transfers Overall transfer level: Needs assistance Equipment used: Rolling walker (2 wheels) Transfers: Sit to/from Stand Sit to Stand: Min guard, Supervision   Step pivot transfers: Min guard, Supervision       General transfer comment: requires verbal/tactile cueing and Min guard/Supervision for completing sit to stands    Ambulation/Gait Ambulation/Gait assistance: Min guard, Supervision Gait Distance (Feet): 200 Feet Assistive device: Rolling walker (2 wheels) Gait Pattern/deviations: Decreased step length - right, Decreased step length - left, Decreased stride length, Trunk flexed Gait velocity: decreased     General Gait Details: slow labord cadence with severely flexed trunk due to kyphosis, no loss of balance, limited mostly due to fatigue, on room  air with SpO2 ranging from 97% to 93% and pulse rate range from 77 to 98 bpm   Stairs             Wheelchair Mobility    Modified Rankin (Stroke  Patients Only)       Balance Overall balance assessment: Needs assistance Sitting-balance support: Feet supported, No upper extremity supported Sitting balance-Leahy Scale: Fair Sitting balance - Comments: fair/good seated at EOB   Standing balance support: During functional activity, Bilateral upper extremity supported Standing balance-Leahy Scale: Fair Standing balance comment: using RW        Cognition Arousal/Alertness: Awake/alert Behavior During Therapy: WFL for tasks assessed/performed Overall Cognitive Status: Within Functional Limits for tasks assessed          Exercises General Exercises - Upper Extremity Shoulder Flexion: AROM, Strengthening, Both, 10 reps, Seated General Exercises - Lower Extremity Long Arc Quad: AROM, Strengthening, Both, 10 reps, Seated Hip ABduction/ADduction: Strengthening, Both, 10 reps, Seated (isometric) Hip Flexion/Marching: AROM, Strengthening, Both, 10 reps, Seated Toe Raises: AROM, Strengthening, Both, 10 reps, Seated Heel Raises: AROM, Strengthening, Both, 10 reps, Seated    General Comments        Pertinent Vitals/Pain Pain Assessment Pain Assessment: No/denies pain    Home Living           Prior Function            PT Goals (current goals can now be found in the care plan section) Acute Rehab PT Goals Patient Stated Goal: return home with home aides and family to assist PT Goal Formulation: With patient Time For Goal Achievement: 06/17/21 Potential to Achieve Goals: Good Progress towards PT goals: Progressing toward goals    Frequency    Min 3X/week      PT Plan         AM-PAC PT "6 Clicks" Mobility   Outcome Measure  Help needed turning from your back to your side while in a flat bed without using bedrails?: None Help needed moving from lying on your back to sitting on the side of a flat bed without using bedrails?: A Little Help needed moving to and from a bed to a chair (including a  wheelchair)?: A Little Help needed standing up from a chair using your arms (e.g., wheelchair or bedside chair)?: A Little Help needed to walk in hospital room?: A Little Help needed climbing 3-5 steps with a railing? : A Lot 6 Click Score: 18    End of Session   Activity Tolerance: Patient tolerated treatment well;Patient limited by fatigue Patient left: in chair;with call bell/phone within reach Nurse Communication: Mobility status PT Visit Diagnosis: Unsteadiness on feet (R26.81);Other abnormalities of gait and mobility (R26.89);Muscle weakness (generalized) (M62.81)     Time: 7121-9758 PT Time Calculation (min) (ACUTE ONLY): 23 min  Charges:  $Therapeutic Exercise: 8-22 mins $Therapeutic Activity: 8-22 mins                     Floria Raveling. Hartnett-Rands, MS, PT Per [place] [phone]  Pamala Hurry  Hartnett-Rands 06/11/2021, 1:54 PM

## 2021-06-12 DIAGNOSIS — R531 Weakness: Secondary | ICD-10-CM | POA: Diagnosis not present

## 2021-06-12 DIAGNOSIS — K219 Gastro-esophageal reflux disease without esophagitis: Secondary | ICD-10-CM | POA: Diagnosis not present

## 2021-06-12 DIAGNOSIS — J9601 Acute respiratory failure with hypoxia: Secondary | ICD-10-CM | POA: Diagnosis not present

## 2021-06-12 DIAGNOSIS — J189 Pneumonia, unspecified organism: Secondary | ICD-10-CM | POA: Diagnosis not present

## 2021-06-12 DIAGNOSIS — D72829 Elevated white blood cell count, unspecified: Secondary | ICD-10-CM

## 2021-06-12 LAB — CBC
HCT: 31.2 % — ABNORMAL LOW (ref 36.0–46.0)
Hemoglobin: 9.7 g/dL — ABNORMAL LOW (ref 12.0–15.0)
MCH: 28.3 pg (ref 26.0–34.0)
MCHC: 31.1 g/dL (ref 30.0–36.0)
MCV: 91 fL (ref 80.0–100.0)
Platelets: 268 10*3/uL (ref 150–400)
RBC: 3.43 MIL/uL — ABNORMAL LOW (ref 3.87–5.11)
RDW: 13.8 % (ref 11.5–15.5)
WBC: 8.3 10*3/uL (ref 4.0–10.5)
nRBC: 0 % (ref 0.0–0.2)

## 2021-06-12 LAB — PROCALCITONIN: Procalcitonin: 0.26 ng/mL

## 2021-06-12 LAB — BASIC METABOLIC PANEL
Anion gap: 7 (ref 5–15)
BUN: 15 mg/dL (ref 8–23)
CO2: 26 mmol/L (ref 22–32)
Calcium: 8.4 mg/dL — ABNORMAL LOW (ref 8.9–10.3)
Chloride: 104 mmol/L (ref 98–111)
Creatinine, Ser: 0.87 mg/dL (ref 0.44–1.00)
GFR, Estimated: >60 mL/min (ref >60)
Glucose, Bld: 105 mg/dL — ABNORMAL HIGH (ref 70–99)
Potassium: 4.3 mmol/L (ref 3.5–5.1)
Sodium: 137 mmol/L (ref 135–145)

## 2021-06-12 MED ORDER — AZITHROMYCIN 500 MG PO TABS
500.0000 mg | ORAL_TABLET | Freq: Every day | ORAL | 0 refills | Status: DC
Start: 2021-06-13 — End: 2021-06-16

## 2021-06-12 MED ORDER — CEFDINIR 300 MG PO CAPS: 300.0000 mg | ORAL_CAPSULE | Freq: Every day | ORAL | 0 refills | Status: AC

## 2021-06-12 MED ORDER — SACCHAROMYCES BOULARDII 250 MG PO CAPS: 250.0000 mg | ORAL_CAPSULE | Freq: Two times a day (BID) | ORAL | 0 refills | Status: AC

## 2021-06-12 MED ORDER — ALBUTEROL SULFATE HFA 108 (90 BASE) MCG/ACT IN AERS
2.0000 | INHALATION_SPRAY | RESPIRATORY_TRACT | 2 refills | Status: AC | PRN
Start: 2021-06-12 — End: ?

## 2021-06-12 MED ORDER — ZOLPIDEM TARTRATE 5 MG PO TABS: 5.0000 mg | ORAL_TABLET | Freq: Every day | ORAL | Status: DC

## 2021-06-12 NOTE — Progress Notes (Signed)
Pt alert and oriented and pt son in law here to transport . Nursing staff took pt to main entrance in wheel chair no complaints of pain or discomfort

## 2021-06-12 NOTE — Assessment & Plan Note (Signed)
--   complete course of eye drop treatments

## 2021-06-12 NOTE — TOC Transition Note (Signed)
Transition of Care [facility]) - CM/SW Discharge Note   Patient Details  Name: [name] MRN: [id number] Date of Birth: 1934-02-04  Transition of Care [facility]) CM/SW Contact:  Boneta Lucks, RN Phone Number: 06/12/2021, 12:19 PM   Clinical Narrative:   Patient discharging home. Patient lives with her daughter and son in [place]. She has a Cap aide 8 hours a day 5 days a week.  MD ordered Home health. TOC spoke with patient at the bedside and her daughter. Patient has been walking with a walker for over a year and doing well on her own. They do not wish to have home health at this time.   Final next level of care: Home/Self Care Barriers to Discharge: Barriers Resolved  Patient Goals and CMS Choice Patient states their goals for this hospitalization and ongoing recovery are:: to go home. CMS Medicare.gov Compare Post Acute Care list provided to:: Patient Choice offered to / list presented to : Patient  Discharge Placement            Patient to be transferred to facility by: son in law Name of family member notified: Daughter Patient and family notified of of transfer: 06/12/21    Readmission Risk Interventions    06/12/2021   12:11 PM 10/02/2018   11:26 AM  Readmission Risk Prevention Plan  Transportation Screening Complete Complete  PCP or Specialist Appt within 3-5 Days Complete   HRI or Home Care Consult Complete   Social Work Consult for [place] Planning/Counseling Complete   Palliative Care Screening Not Applicable   Medication Review [employer]) Complete Complete  PCP or Specialist appointment within 3-5 days of discharge  Not Complete  PCP/Specialist Appt Not Complete comments  Got the earliest appointment available-2 weeks  [place] or [place]  Not Complete  SW Recovery Care/Counseling Consult  Not Complete  Palliative Care Screening  Not [place]  Not Applicable

## 2021-06-12 NOTE — Progress Notes (Signed)
Physical Therapy Treatment Patient Details Name: [name] MRN: [id number] DOB: 11-12-1934 Today's Date: 06/12/2021   History of Present Illness [name] is a 87 y.o. female with medical history significant of anxiety, chronic diastolic heart failure, chronic obstructive pulmonary disease, GERD, hyperlipidemia, and more presents the ED with a chief complaint of generalized weakness.  Patient reports that she is not able to walk today.  She ambulates with a walker at baseline.  Today she cannot ambulate at all.  She reports to me that she felt short of breath and had a cough, but does not know when they started.  At first prior to the exam she does not even know that she is in the hospital, but once reoriented she does note that she had been short of breath.  She denies fever and chills.  She denies chest pains, palpitations, nausea, vomiting, diarrhea, dysuria.  Patient reports that she does not wear oxygen at home.  She denies peripheral edema but in fact does have peripheral edema.  No further history could be obtained at this time.  It is unclear patient is disoriented because I just woke her up, or if this is her baseline mentation with her dementia.    PT Comments    Patient with labored transition to EOB but does not require assist. She demonstrates good sitting balance EOB while completing seated exercises. Patient utilizes RW for transfer to standing and ambulation without loss of balance. Patient given cueing for proper RW use with good carry over. Patient will benefit from continued skilled physical therapy in hospital and recommended venue below to increase strength, balance, endurance for safe ADLs and gait.    Recommendations for follow up therapy are one component of a multi-disciplinary discharge planning process, led by the attending physician.  Recommendations may be updated based on patient status, additional functional criteria and insurance authorization.  Follow Up  Recommendations  Home health PT     Assistance Recommended at Discharge Set up Supervision/Assistance  Patient can return home with the following A little help with walking and/or transfers;A little help with bathing/dressing/bathroom;Help with stairs or ramp for entrance;Assistance with cooking/housework   Equipment Recommendations  None recommended by PT    Recommendations for Other Services       Precautions / Restrictions Precautions Precautions: Fall Restrictions Weight Bearing Restrictions: No     Mobility  Bed Mobility Overal bed mobility: Modified Independent Bed Mobility: Supine to Sit, Sit to Supine     Supine to sit: Modified independent (Device/Increase time) Sit to supine: Modified independent (Device/Increase time)        Transfers Overall transfer level: Needs assistance Equipment used: Rolling walker (2 wheels) Transfers: Sit to/from Stand Sit to Stand: Min guard, Supervision                Ambulation/Gait Ambulation/Gait assistance: Supervision, Min guard Gait Distance (Feet): 120 Feet   Gait Pattern/deviations: Decreased step length - right, Decreased step length - left, Decreased stride length, Trunk flexed Gait velocity: decreased     General Gait Details: slow labord cadence with severely flexed trunk due to kyphosis, cueing for proper RW use   Stairs             Wheelchair Mobility    Modified Rankin (Stroke Patients Only)       Balance Overall balance assessment: Needs assistance Sitting-balance support: Feet supported, No upper extremity supported Sitting balance-Leahy Scale: Fair Sitting balance - Comments: fair/good seated at EOB  Standing balance support: During functional activity, Bilateral upper extremity supported Standing balance-Leahy Scale: Fair Standing balance comment: using RW                            Cognition Arousal/Alertness: Awake/alert Behavior During Therapy: WFL for tasks  assessed/performed Overall Cognitive Status: Within Functional Limits for tasks assessed                                          Exercises General Exercises - Lower Extremity Long Arc Quad: AROM, Strengthening, Both, 10 reps, Seated Hip Flexion/Marching: AROM, Strengthening, Both, 10 reps, Seated Toe Raises: AROM, Strengthening, Both, 10 reps, Seated Heel Raises: AROM, Strengthening, Both, 10 reps, Seated    General Comments        Pertinent Vitals/Pain Pain Assessment Pain Assessment: No/denies pain    Home Living                          Prior Function            PT Goals (current goals can now be found in the care plan section) Acute Rehab PT Goals Patient Stated Goal: return home with home aides and family to assist PT Goal Formulation: With patient Time For Goal Achievement: 06/17/21 Potential to Achieve Goals: Good Progress towards PT goals: Progressing toward goals    Frequency    Min 3X/week      PT Plan Current plan remains appropriate    Co-evaluation              AM-PAC PT "6 Clicks" Mobility   Outcome Measure  Help needed turning from your back to your side while in a flat bed without using bedrails?: None Help needed moving from lying on your back to sitting on the side of a flat bed without using bedrails?: A Little Help needed moving to and from a bed to a chair (including a wheelchair)?: A Little Help needed standing up from a chair using your arms (e.g., wheelchair or bedside chair)?: A Little Help needed to walk in hospital room?: A Little Help needed climbing 3-5 steps with a railing? : A Lot 6 Click Score: 18    End of Session Equipment Utilized During Treatment: Gait belt Activity Tolerance: Patient tolerated treatment well;Patient limited by fatigue Patient left: with call bell/phone within reach;in bed Nurse Communication: Mobility status PT Visit Diagnosis: Unsteadiness on feet (R26.81);Other  abnormalities of gait and mobility (R26.89);Muscle weakness (generalized) (M62.81)     Time: 1000-1019 PT Time Calculation (min) (ACUTE ONLY): 19 min  Charges:  $Therapeutic Activity: 8-22 mins                     10:58 AM, 06/12/21 Mearl Latin PT, DPT Physical Therapist at [facility]

## 2021-06-12 NOTE — Discharge Instructions (Signed)
IMPORTANT INFORMATION: PAY CLOSE ATTENTION   PHYSICIAN DISCHARGE INSTRUCTIONS  Follow with Primary care provider  Pllc, [facility]  and other consultants as instructed by your Hospitalist Physician  SEEK MEDICAL CARE OR RETURN TO EMERGENCY ROOM IF SYMPTOMS COME BACK, WORSEN OR NEW PROBLEM DEVELOPS   Please note: You were cared for by a hospitalist during your hospital stay. Every effort will be made to forward records to your primary care provider.  You can request that your primary care provider send for your hospital records if they have not received them.  Once you are discharged, your primary care physician will handle any further medical issues. Please note that NO REFILLS for any discharge medications will be authorized once you are discharged, as it is imperative that you return to your primary care physician (or establish a relationship with a primary care physician if you do not have one) for your post hospital discharge needs so that they can reassess your need for medications and monitor your lab values.  Please get a complete blood count and chemistry panel checked by your Primary MD at your next visit, and again as instructed by your Primary MD.  Get Medicines reviewed and adjusted: Please take all your medications with you for your next visit with your Primary MD  Laboratory/radiological data: Please request your Primary MD to go over all hospital tests and procedure/radiological results at the follow up, please ask your primary care provider to get all Hospital records sent to his/her office.  In some cases, they will be blood work, cultures and biopsy results pending at the time of your discharge. Please request that your primary care provider follow up on these results.  If you are diabetic, please bring your blood sugar readings with you to your follow up appointment with primary care.    Please call and make your follow up appointments as soon as possible.    Also  Note the following: If you experience worsening of your admission symptoms, develop shortness of breath, life threatening emergency, suicidal or homicidal thoughts you must seek medical attention immediately by calling 911 or calling your MD immediately  if symptoms less severe.  You must read complete instructions/literature along with all the possible adverse reactions/side effects for all the Medicines you take and that have been prescribed to you. Take any new Medicines after you have completely understood and accpet all the possible adverse reactions/side effects.   Do not drive when taking Pain medications or sleeping medications (Benzodiazepines)  Do not take more than prescribed Pain, Sleep and Anxiety Medications. It is not advisable to combine anxiety,sleep and pain medications without talking with your primary care practitioner  Special Instructions: If you have smoked or chewed Tobacco  in the last 2 yrs please stop smoking, stop any regular Alcohol  and or any Recreational drug use.  Wear Seat belts while driving.  Do not drive if taking any narcotic, mind altering or controlled substances or recreational drugs or alcohol.       

## 2021-06-12 NOTE — Care Management Important Message (Signed)
Important Message  Patient Details  Name: [name] MRN: [id number] Date of Birth: 09-09-1934   Medicare Important Message Given:  Yes  Reviewed Medicare IM with patient via room phone [phone]).  Copy of Medicare IM placed in mail to home address on file.   Dannette Barbara 06/12/2021, 10:02 AM

## 2021-06-12 NOTE — Discharge Summary (Signed)
Physician Discharge Summary  [name]:[id number] DOB: January 10, 1934 DOA: 06/09/2021  PCP: Alanson Puls [facility] date: 06/09/2021 Discharge date: 06/12/2021  Admitted From:  Home  Disposition: Home with Home Health   Recommendations for Outpatient Follow-up:  Follow up with PCP in 1 weeks   Home Health:  PT   Discharge Condition: Stable   CODE STATUS: DNR DIET: resume prior home diet    Brief Hospitalization Summary: Please see all hospital notes, images, labs for full details of the hospitalization. 87 y.o. female with medical history significant of anxiety, chronic diastolic heart failure, chronic obstructive pulmonary disease, GERD, hyperlipidemia, and more presents the ED with a chief complaint of generalized weakness.  Patient reports that she is not able to walk today.  She ambulates with a walker at baseline.  Today she cannot ambulate at all.  She reports to me that she felt short of breath and had a cough, but does not know when they started.  At first prior to the exam she does not even know that she is in the hospital, but once reoriented she does note that she had been short of breath.  She denies fever and chills.  She denies chest pains, palpitations, nausea, vomiting, diarrhea, dysuria.  Patient reports that she does not wear oxygen at home.  She denies peripheral edema but in fact does have peripheral edema.  No further history could be obtained at this time.  It is unclear patient is disoriented because I just woke her up, or if this is her baseline mentation with her dementia.  HOSPITAL COURSE BY PROBLEM   Assessment and Plan: * CAP (community acquired pneumonia) - Diagnostic uncertainty, procalcitonin pending -Chest x-ray is with interstitial lung changes (there is a history of pulmonary fibrosis in her chart) that could be hiding a pneumonia -Leukocytosis 20.5, generalized weakness, hypoxia requiring 2 L nasal cannula -Rocephin and Zithromax IV treated with  good results -Blood cultures drawn - no growth to date -pt is clinically much improved and asking to go home, her WBC is normal and her Vitals are stable.  Pt is on room air.  -DC home today to complete abx orally for next 3 days  Leukocytosis -- WBC trending down to normal now with treatments.    Bilateral conjunctivitis -- complete course of eye drop treatments  Generalized weakness - Most likely secondary to pneumonia and hypoxia which has resolved now -TSH within normal limits  -PT eval and treat - recommending Home health PT which is ordered   Tobacco use disorder - Patient reports using dip -Nicotine patch ordered -Pt strongly advised she needs to stop all tobacco use  Acute respiratory failure with hypoxia ([place]) RESOLVED NOW.  PT is on ROOM AIR   Hyperlipidemia Continue Zocor  Dementia (HCC) Continue Namenda  GERD (gastroesophageal reflux disease) Protonix used in hospital   Discharge Diagnoses:  Principal Problem:   CAP (community acquired pneumonia) Active Problems:   Leukocytosis   GERD (gastroesophageal reflux disease)   Dementia (HCC)   Hyperlipidemia   Acute respiratory failure with hypoxia (HCC)   Tobacco use disorder   Generalized weakness   Bilateral conjunctivitis   Discharge Instructions:  Allergies as of 06/12/2021       Reactions   Codeine Shortness Of Breath, Rash   Codeine Anaphylaxis   Levofloxacin Anaphylaxis   Patient was admitted to hospital with lung problems due to this medication   Sulfonamide Derivatives Hives, Shortness Of Breath   Sulfa Antibiotics  Hives        Medication List     TAKE these medications    albuterol (2.5 MG/3ML) 0.083% nebulizer solution Commonly known as: PROVENTIL Take 3 mLs (2.5 mg total) by nebulization every 6 (six) hours as needed for wheezing or shortness of breath.   albuterol 108 (90 Base) MCG/ACT inhaler Commonly known as: ProAir HFA Inhale 2 puffs into the lungs every 4 (four) hours as  needed for wheezing or shortness of breath.   ALPRAZolam 0.5 MG tablet Commonly known as: XANAX Take 0.5 mg by mouth 2 (two) times daily.   azithromycin 500 MG tablet Commonly known as: Zithromax Take 1 tablet (500 mg total) by mouth daily for 3 days. Take 1 tablet daily for 3 days. Start taking on: June 13, 2021   cefdinir 300 MG capsule Commonly known as: OMNICEF Take 1 capsule (300 mg total) by mouth daily for 3 days. Start taking on: June 13, 2021   cetirizine 10 MG tablet Commonly known as: ZYRTEC Take 10 mg by mouth daily as needed for allergies.   diclofenac Sodium 1 % Gel Commonly known as: VOLTAREN APPLY AS DIRECTED TO THE AFFECTED KNEE FOUR TIMES DAILY. What changed: See the new instructions.   donepezil 10 MG tablet Commonly known as: ARICEPT Take 10 mg by mouth daily.   furosemide 40 MG tablet Commonly known as: LASIX Take 40 mg by mouth daily.   Linzess 145 MCG Caps capsule Generic drug: linaclotide Take 145 mcg by mouth daily as needed (bowel movement).   memantine 10 MG tablet Commonly known as: NAMENDA Take 10 mg by mouth 2 (two) times daily.   multivitamin with minerals Tabs tablet Take 1 tablet by mouth daily.   neomycin-polymyxin b-dexamethasone 3.5-10000-0.1 Susp Commonly known as: MAXITROL Place 1 drop into both eyes in the morning, at noon, and at bedtime.   pantoprazole 40 MG tablet Commonly known as: PROTONIX TAKE (1) TABLET BY MOUTH TWICE A DAY WITH MEALS (BREAKFAST AND SUPPER) What changed: See the new instructions.   potassium chloride 10 MEQ tablet Commonly known as: KLOR-CON Take 1 tablet (10 mEq total) by mouth daily.   saccharomyces boulardii 250 MG capsule Commonly known as: Florastor Take 1 capsule (250 mg total) by mouth 2 (two) times daily for 14 days.   simvastatin 10 MG tablet Commonly known as: ZOCOR Take 5 mg by mouth daily.   traMADol 50 MG tablet Commonly known as: ULTRAM Take 100 mg by mouth every 6 (six)  hours as needed for moderate pain.   umeclidinium-vilanterol 62.5-25 MCG/INH Aepb Commonly known as: ANORO ELLIPTA Inhale 1 puff into the lungs daily.   zolpidem 5 MG tablet Commonly known as: AMBIEN Take 1 tablet (5 mg total) by mouth at bedtime for 13 days.        Follow-up Information     [facility]ll[facility], [facility]. Schedule an appointment as soon as possible for a visit in 1 week(s).   Why: Hospital Follow Up Contact information: [address] [place] [address] [phone]                Allergies  Allergen Reactions   Codeine Shortness Of Breath and Rash   Codeine Anaphylaxis   Levofloxacin Anaphylaxis    Patient was admitted to hospital with lung problems due to this medication   Sulfonamide Derivatives Hives and Shortness Of Breath   Sulfa Antibiotics Hives   Allergies as of 06/12/2021       Reactions  Codeine Shortness Of Breath, Rash   Codeine Anaphylaxis   Levofloxacin Anaphylaxis   Patient was admitted to hospital with lung problems due to this medication   Sulfonamide Derivatives Hives, Shortness Of Breath   Sulfa Antibiotics Hives        Medication List     TAKE these medications    albuterol (2.5 MG/3ML) 0.083% nebulizer solution Commonly known as: PROVENTIL Take 3 mLs (2.5 mg total) by nebulization every 6 (six) hours as needed for wheezing or shortness of breath.   albuterol 108 (90 Base) MCG/ACT inhaler Commonly known as: ProAir HFA Inhale 2 puffs into the lungs every 4 (four) hours as needed for wheezing or shortness of breath.   ALPRAZolam 0.5 MG tablet Commonly known as: XANAX Take 0.5 mg by mouth 2 (two) times daily.   azithromycin 500 MG tablet Commonly known as: Zithromax Take 1 tablet (500 mg total) by mouth daily for 3 days. Take 1 tablet daily for 3 days. Start taking on: June 13, 2021   cefdinir 300 MG capsule Commonly known as: OMNICEF Take 1 capsule (300 mg total) by mouth daily for 3 days. Start taking on:  June 13, 2021   cetirizine 10 MG tablet Commonly known as: ZYRTEC Take 10 mg by mouth daily as needed for allergies.   diclofenac Sodium 1 % Gel Commonly known as: VOLTAREN APPLY AS DIRECTED TO THE AFFECTED KNEE FOUR TIMES DAILY. What changed: See the new instructions.   donepezil 10 MG tablet Commonly known as: ARICEPT Take 10 mg by mouth daily.   furosemide 40 MG tablet Commonly known as: LASIX Take 40 mg by mouth daily.   Linzess 145 MCG Caps capsule Generic drug: linaclotide Take 145 mcg by mouth daily as needed (bowel movement).   memantine 10 MG tablet Commonly known as: NAMENDA Take 10 mg by mouth 2 (two) times daily.   multivitamin with minerals Tabs tablet Take 1 tablet by mouth daily.   neomycin-polymyxin b-dexamethasone 3.5-10000-0.1 Susp Commonly known as: MAXITROL Place 1 drop into both eyes in the morning, at noon, and at bedtime.   pantoprazole 40 MG tablet Commonly known as: PROTONIX TAKE (1) TABLET BY MOUTH TWICE A DAY WITH MEALS (BREAKFAST AND SUPPER) What changed: See the new instructions.   potassium chloride 10 MEQ tablet Commonly known as: KLOR-CON Take 1 tablet (10 mEq total) by mouth daily.   saccharomyces boulardii 250 MG capsule Commonly known as: Florastor Take 1 capsule (250 mg total) by mouth 2 (two) times daily for 14 days.   simvastatin 10 MG tablet Commonly known as: ZOCOR Take 5 mg by mouth daily.   traMADol 50 MG tablet Commonly known as: ULTRAM Take 100 mg by mouth every 6 (six) hours as needed for moderate pain.   umeclidinium-vilanterol 62.5-25 MCG/INH Aepb Commonly known as: ANORO ELLIPTA Inhale 1 puff into the lungs daily.   zolpidem 5 MG tablet Commonly known as: AMBIEN Take 1 tablet (5 mg total) by mouth at bedtime for 13 days.        Procedures/Studies: DG Chest Port 1 View  Result Date: 06/10/2021 CLINICAL DATA:  Generalized weakness and possible sepsis, initial encounter EXAM: PORTABLE CHEST 1 VIEW  COMPARISON:  05/13/2021 FINDINGS: Large hiatal hernia is noted. Cardiac shadow is stable. Chronic interstitial changes are again seen. No focal infiltrate or sizable effusion is noted. Old rib fractures are noted bilaterally. No acute bony abnormality is seen. IMPRESSION: Stable large hiatal hernia. Chronic interstitial changes similar to that noted on the  prior exam. Electronically Signed   By: Inez Catalina M.D.   On: 06/10/2021 00:28   DG Chest 2 View  Result Date: 05/13/2021 CLINICAL DATA:  Shortness of breath EXAM: CHEST - 2 VIEW COMPARISON:  03/14/2021, 10/18/2018, CT 06/29/2018 FINDINGS: Diffuse bilateral reticular opacity consistent with chronic lung disease. Small pleural effusions or pleural thickening. No consolidation. Stable cardiomediastinal silhouette with large hiatal hernia. Kyphosis of the spine with multiple chronic compression fractures and osteopenia. IMPRESSION: Evidence for chronic lung disease without acute airspace disease. Large hiatal hernia Electronically Signed   By: Donavan Foil M.D.   On: 05/13/2021 21:24     Subjective: Pt reports that she has no SOB, no cough, she wants to go home, she is on room air.    Discharge Exam: Vitals:   06/11/21 2100 06/12/21 0525  BP: 126/65 104/60  Pulse: 80 75  Resp: 19   Temp: 98.1 F (36.7 C) 97.7 F (36.5 C)  SpO2:  95%   Vitals:   06/11/21 1902 06/11/21 2004 06/11/21 2100 06/12/21 0525  BP:   126/65 104/60  Pulse:   80 75  Resp:   19   Temp:   98.1 F (36.7 C) 97.7 F (36.5 C)  TempSrc:    Oral  SpO2: 96% 94%  95%  Weight:      Height:       General: Pt is alert, awake, not in acute distress Cardiovascular: RRR, S1/S2 +, no rubs, no gallops Respiratory: CTA bilaterally, no wheezing, no rhonchi Abdominal: Soft, NT, ND, bowel sounds + Extremities: no edema, no cyanosis   The results of significant diagnostics from this hospitalization (including imaging, microbiology, ancillary and laboratory) are listed below for  reference.     Microbiology: Recent Results (from the past 240 hour(s))  Blood Culture (routine x 2)     Status: None (Preliminary result)   Collection Time: 06/09/21 11:58 PM   Specimen: BLOOD RIGHT FOREARM  Result Value Ref Range Status   Specimen Description BLOOD RIGHT FOREARM  Final   Special Requests   Final    BOTTLES DRAWN AEROBIC AND ANAEROBIC Blood Culture adequate volume   Culture   Final    NO GROWTH 2 DAYS Performed at [facility], [address] [place], [place] [address]    Report Status PENDING  Incomplete  Blood Culture (routine x 2)     Status: None (Preliminary result)   Collection Time: 06/10/21 12:51 AM   Specimen: Left Antecubital; Blood  Result Value Ref Range Status   Specimen Description LEFT ANTECUBITAL  Final   Special Requests   Final    BOTTLES DRAWN AEROBIC AND ANAEROBIC Blood Culture adequate volume   Culture   Final    NO GROWTH 2 DAYS Performed at [facility], [address], [place], [place] [address]    Report Status PENDING  Incomplete     Labs: BNP (last 3 results) Recent Labs    05/13/21 2022  BNP 85.0   Basic Metabolic Panel: Recent Labs  Lab 06/09/21 2358 06/11/21 0506 06/12/21 0440  NA 130* 137 137  K 4.2 3.7 4.3  CL 97* 101 104  CO2 '25 26 26  '$ GLUCOSE 110* 81 105*  BUN '21 17 15  '$ CREATININE 1.05* 0.94 0.87  CALCIUM 8.7* 8.5* 8.4*  MG  --  2.6*  --    Liver Function Tests: Recent Labs  Lab 06/09/21 2358 06/11/21 0506  AST 33 26  ALT 25 27  ALKPHOS 100 106  BILITOT 1.0 0.6  PROT 7.0 6.3*  ALBUMIN 3.6 3.1*   No results for input(s): "LIPASE", "AMYLASE" in the last 168 hours. No results for input(s): "AMMONIA" in the last 168 hours. CBC: Recent Labs  Lab 06/09/21 2358 06/11/21 0506 06/12/21 0440  WBC 20.5* 10.9* 8.3  NEUTROABS 17.2* 8.3*  --   HGB 10.4* 10.1* 9.7*  HCT 32.4* 33.0* 31.2*  MCV 90.0 91.9 91.0  PLT 236 217 268   Cardiac Enzymes: No results for input(s): "CKTOTAL", "CKMB", "CKMBINDEX",  "TROPONINI" in the last 168 hours. BNP: Invalid input(s): "POCBNP" CBG: No results for input(s): "GLUCAP" in the last 168 hours. D-Dimer No results for input(s): "DDIMER" in the last 72 hours. Hgb A1c No results for input(s): "HGBA1C" in the last 72 hours. Lipid Profile No results for input(s): "CHOL", "HDL", "LDLCALC", "TRIG", "CHOLHDL", "LDLDIRECT" in the last 72 hours. Thyroid function studies Recent Labs    06/11/21 0506  TSH 3.109   Anemia work up No results for input(s): "VITAMINB12", "FOLATE", "FERRITIN", "TIBC", "IRON", "RETICCTPCT" in the last 72 hours. Urinalysis    Component Value Date/Time   COLORURINE YELLOW 06/09/2021 2351   APPEARANCEUR CLEAR 06/09/2021 2351   LABSPEC 1.014 06/09/2021 2351   PHURINE 5.0 06/09/2021 2351   GLUCOSEU NEGATIVE 06/09/2021 2351   HGBUR NEGATIVE 06/09/2021 2351   BILIRUBINUR NEGATIVE 06/09/2021 2351   KETONESUR 5 (A) 06/09/2021 2351   PROTEINUR NEGATIVE 06/09/2021 2351   UROBILINOGEN 0.2 03/01/2012 2011   NITRITE NEGATIVE 06/09/2021 2351   LEUKOCYTESUR NEGATIVE 06/09/2021 2351   Sepsis Labs Recent Labs  Lab 06/09/21 2358 06/11/21 0506 06/12/21 0440  WBC 20.5* 10.9* 8.3   Microbiology Recent Results (from the past 240 hour(s))  Blood Culture (routine x 2)     Status: None (Preliminary result)   Collection Time: 06/09/21 11:58 PM   Specimen: BLOOD RIGHT FOREARM  Result Value Ref Range Status   Specimen Description BLOOD RIGHT FOREARM  Final   Special Requests   Final    BOTTLES DRAWN AEROBIC AND ANAEROBIC Blood Culture adequate volume   Culture   Final    NO GROWTH 2 DAYS Performed at [facility], [address] [place], [place] [address]    Report Status PENDING  Incomplete  Blood Culture (routine x 2)     Status: None (Preliminary result)   Collection Time: 06/10/21 12:51 AM   Specimen: Left Antecubital; Blood  Result Value Ref Range Status   Specimen Description LEFT ANTECUBITAL  Final   Special Requests   Final     BOTTLES DRAWN AEROBIC AND ANAEROBIC Blood Culture adequate volume   Culture   Final    NO GROWTH 2 DAYS Performed at [facility], [address] [place], [place] [address]    Report Status PENDING  Incomplete   Time coordinating discharge: 37 mins   SIGNED:  Irwin Brakeman, MD  [facility] 06/12/2021, 11:02 AM How to contact the [facility] Attending or Consulting provider [place] or covering provider during after hours [place], for this patient?  Check the care team in [facility] and look for a) attending/consulting [facility] provider listed and b) the [facility] team listed Log into [email or web address] and use [place] universal password to access. If you do not have the password, please contact the hospital operator. Locate the [facility] provider you are looking for under [facility] and page to a number that you can be directly reached. If you still have difficulty reaching the provider, please page the [facility] (Director  on Call) for the Hospitalists listed on amion for assistance.

## 2021-06-12 NOTE — Progress Notes (Signed)
Msh sent to on call daughter is requesting a call to discuss mothers discharge rose medaniel [phone] cell number she lives with her

## 2021-06-15 LAB — LEGIONELLA PNEUMOPHILA SEROGP 1 UR AG: L. pneumophila Serogp 1 Ur Ag: NEGATIVE

## 2021-06-15 LAB — CULTURE, BLOOD (ROUTINE X 2)
Culture: NO GROWTH
Culture: NO GROWTH
Special Requests: ADEQUATE
Special Requests: ADEQUATE

## 2021-09-23 ENCOUNTER — Emergency Department (HOSPITAL_COMMUNITY): Payer: Medicare Other

## 2021-09-23 ENCOUNTER — Encounter (HOSPITAL_COMMUNITY): Payer: Self-pay | Admitting: Emergency Medicine

## 2021-09-23 ENCOUNTER — Other Ambulatory Visit: Payer: Self-pay

## 2021-09-23 ENCOUNTER — Inpatient Hospital Stay (HOSPITAL_COMMUNITY)
Admission: EM | Admit: 2021-09-23 | Discharge: 2021-09-30 | DRG: 521 | Disposition: A | Discharge status: Skilled Nursing Facility | Payer: Medicare Other | Attending: Internal Medicine | Admitting: Internal Medicine

## 2021-09-23 DIAGNOSIS — J441 Chronic obstructive pulmonary disease with (acute) exacerbation: Secondary | ICD-10-CM | POA: Diagnosis present

## 2021-09-23 DIAGNOSIS — E8809 Other disorders of plasma-protein metabolism, not elsewhere classified: Secondary | ICD-10-CM | POA: Diagnosis present

## 2021-09-23 DIAGNOSIS — E782 Mixed hyperlipidemia: Secondary | ICD-10-CM | POA: Diagnosis present

## 2021-09-23 DIAGNOSIS — Z79899 Other long term (current) drug therapy: Secondary | ICD-10-CM

## 2021-09-23 DIAGNOSIS — J9611 Chronic respiratory failure with hypoxia: Secondary | ICD-10-CM | POA: Diagnosis not present

## 2021-09-23 DIAGNOSIS — Z885 Allergy status to narcotic agent status: Secondary | ICD-10-CM

## 2021-09-23 DIAGNOSIS — S72002A Fracture of unspecified part of neck of left femur, initial encounter for closed fracture: Secondary | ICD-10-CM

## 2021-09-23 DIAGNOSIS — N3281 Overactive bladder: Secondary | ICD-10-CM | POA: Diagnosis present

## 2021-09-23 DIAGNOSIS — Z66 Do not resuscitate: Secondary | ICD-10-CM | POA: Diagnosis present

## 2021-09-23 DIAGNOSIS — I5032 Chronic diastolic (congestive) heart failure: Secondary | ICD-10-CM | POA: Diagnosis present

## 2021-09-23 DIAGNOSIS — W19XXXA Unspecified fall, initial encounter: Secondary | ICD-10-CM

## 2021-09-23 DIAGNOSIS — Z7951 Long term (current) use of inhaled steroids: Secondary | ICD-10-CM

## 2021-09-23 DIAGNOSIS — D62 Acute posthemorrhagic anemia: Secondary | ICD-10-CM | POA: Diagnosis not present

## 2021-09-23 DIAGNOSIS — Z882 Allergy status to sulfonamides status: Secondary | ICD-10-CM

## 2021-09-23 DIAGNOSIS — Z881 Allergy status to other antibiotic agents status: Secondary | ICD-10-CM

## 2021-09-23 DIAGNOSIS — R339 Retention of urine, unspecified: Secondary | ICD-10-CM | POA: Diagnosis present

## 2021-09-23 DIAGNOSIS — Z8249 Family history of ischemic heart disease and other diseases of the circulatory system: Secondary | ICD-10-CM

## 2021-09-23 DIAGNOSIS — J9601 Acute respiratory failure with hypoxia: Secondary | ICD-10-CM | POA: Diagnosis present

## 2021-09-23 DIAGNOSIS — K5909 Other constipation: Secondary | ICD-10-CM | POA: Diagnosis present

## 2021-09-23 DIAGNOSIS — M810 Age-related osteoporosis without current pathological fracture: Secondary | ICD-10-CM | POA: Diagnosis present

## 2021-09-23 DIAGNOSIS — F419 Anxiety disorder, unspecified: Secondary | ICD-10-CM | POA: Diagnosis present

## 2021-09-23 DIAGNOSIS — S72012A Unspecified intracapsular fracture of left femur, initial encounter for closed fracture: Secondary | ICD-10-CM | POA: Diagnosis not present

## 2021-09-23 DIAGNOSIS — F17200 Nicotine dependence, unspecified, uncomplicated: Secondary | ICD-10-CM | POA: Diagnosis present

## 2021-09-23 DIAGNOSIS — J9621 Acute and chronic respiratory failure with hypoxia: Secondary | ICD-10-CM | POA: Diagnosis present

## 2021-09-23 DIAGNOSIS — E785 Hyperlipidemia, unspecified: Secondary | ICD-10-CM | POA: Diagnosis present

## 2021-09-23 DIAGNOSIS — W1830XA Fall on same level, unspecified, initial encounter: Secondary | ICD-10-CM | POA: Diagnosis present

## 2021-09-23 DIAGNOSIS — Z720 Tobacco use: Secondary | ICD-10-CM

## 2021-09-23 DIAGNOSIS — Y92009 Unspecified place in unspecified non-institutional (private) residence as the place of occurrence of the external cause: Secondary | ICD-10-CM

## 2021-09-23 DIAGNOSIS — J449 Chronic obstructive pulmonary disease, unspecified: Secondary | ICD-10-CM | POA: Diagnosis present

## 2021-09-23 DIAGNOSIS — J189 Pneumonia, unspecified organism: Secondary | ICD-10-CM | POA: Diagnosis present

## 2021-09-23 DIAGNOSIS — R17 Unspecified jaundice: Secondary | ICD-10-CM | POA: Diagnosis present

## 2021-09-23 DIAGNOSIS — E875 Hyperkalemia: Secondary | ICD-10-CM | POA: Diagnosis present

## 2021-09-23 DIAGNOSIS — K581 Irritable bowel syndrome with constipation: Secondary | ICD-10-CM | POA: Diagnosis present

## 2021-09-23 DIAGNOSIS — K219 Gastro-esophageal reflux disease without esophagitis: Secondary | ICD-10-CM | POA: Diagnosis present

## 2021-09-23 DIAGNOSIS — F0390 Unspecified dementia without behavioral disturbance: Secondary | ICD-10-CM | POA: Diagnosis present

## 2021-09-23 LAB — COMPREHENSIVE METABOLIC PANEL
ALT: 23 U/L (ref 0–44)
AST: 29 U/L (ref 15–41)
Albumin: 3.5 g/dL (ref 3.5–5.0)
Alkaline Phosphatase: 93 U/L (ref 38–126)
Anion gap: 13 (ref 5–15)
BUN: 23 mg/dL (ref 8–23)
CO2: 26 mmol/L (ref 22–32)
Calcium: 8.9 mg/dL (ref 8.9–10.3)
Chloride: 98 mmol/L (ref 98–111)
Creatinine, Ser: 1.11 mg/dL — ABNORMAL HIGH (ref 0.44–1.00)
GFR, Estimated: 49 mL/min — ABNORMAL LOW (ref >60)
Glucose, Bld: 109 mg/dL — ABNORMAL HIGH (ref 70–99)
Potassium: 4.4 mmol/L (ref 3.5–5.1)
Sodium: 137 mmol/L (ref 135–145)
Total Bilirubin: 0.7 mg/dL (ref 0.3–1.2)
Total Protein: 7.3 g/dL (ref 6.5–8.1)

## 2021-09-23 LAB — CBC WITH DIFFERENTIAL/PLATELET
Abs Immature Granulocytes: 0.1 10*3/uL — ABNORMAL HIGH (ref 0.00–0.07)
Basophils Absolute: 0 10*3/uL (ref 0.0–0.1)
Basophils Relative: 0 %
Eosinophils Absolute: 0.2 10*3/uL (ref 0.0–0.5)
Eosinophils Relative: 2 %
HCT: 35.2 % — ABNORMAL LOW (ref 36.0–46.0)
Hemoglobin: 11 g/dL — ABNORMAL LOW (ref 12.0–15.0)
Immature Granulocytes: 1 %
Lymphocytes Relative: 11 %
Lymphs Abs: 1 10*3/uL (ref 0.7–4.0)
MCH: 27.8 pg (ref 26.0–34.0)
MCHC: 31.3 g/dL (ref 30.0–36.0)
MCV: 89.1 fL (ref 80.0–100.0)
Monocytes Absolute: 0.6 10*3/uL (ref 0.1–1.0)
Monocytes Relative: 7 %
Neutro Abs: 7.3 10*3/uL (ref 1.7–7.7)
Neutrophils Relative %: 79 %
Platelets: 254 10*3/uL (ref 150–400)
RBC: 3.95 MIL/uL (ref 3.87–5.11)
RDW: 13.6 % (ref 11.5–15.5)
WBC: 9.3 10*3/uL (ref 4.0–10.5)
nRBC: 0 % (ref 0.0–0.2)

## 2021-09-23 MED ORDER — FENTANYL CITRATE PF 50 MCG/ML IJ SOSY
50.0000 ug | PREFILLED_SYRINGE | Freq: Once | INTRAMUSCULAR | Status: AC
  Administered 2021-09-23: 50 ug via INTRAVENOUS
  Filled 2021-09-23: qty 1

## 2021-09-23 MED ORDER — CEFAZOLIN SODIUM-DEXTROSE 2-4 GM/100ML-% IV SOLN
2.0000 g | INTRAVENOUS | Status: AC
  Administered 2021-09-24: 2 g via INTRAVENOUS
  Filled 2021-09-23: qty 100

## 2021-09-23 MED ORDER — TRANEXAMIC ACID-NACL 1000-0.7 MG/100ML-% IV SOLN
1000.0000 mg | INTRAVENOUS | Status: AC
  Administered 2021-09-24: 1000 mg via INTRAVENOUS
  Filled 2021-09-23 (×2): qty 100

## 2021-09-23 NOTE — ED Notes (Signed)
  Patient SPO2 69% on RA on arrival.  Patient placed on 4L [place] and SPO2 up to 90%

## 2021-09-23 NOTE — ED Provider Notes (Signed)
[facility] EMERGENCY DEPARTMENT  Provider Note  CSN: [id number] Arrival date & time: 09/23/21 2212  History Chief Complaint  Patient presents with   Lytle Michaels    [name] is a 87 y.o. female with history of remote R hip fracture repaired in 2020 reports she got up from bed tonight to turn on her fan and fell onto her left side.. She is complaining of L hip pain. She denies head injury to me but reports to RN she did hit her head. Not on a blood thinner. Otherwise she has been in her usual state of health.    Home Medications Prior to Admission medications   Medication Sig Start Date End Date Taking? Authorizing Provider  albuterol (PROAIR HFA) 108 (90 Base) MCG/ACT inhaler Inhale 2 puffs into the lungs every 4 (four) hours as needed for wheezing or shortness of breath. 06/12/21   Johnson, Clanford L, MD  albuterol (PROVENTIL) (2.5 MG/3ML) 0.083% nebulizer solution Take 3 mLs (2.5 mg total) by nebulization every 6 (six) hours as needed for wheezing or shortness of breath. 06/23/18   Gerlene Fee, NP  ALPRAZolam Duanne Moron) 0.5 MG tablet Take 0.5 mg by mouth 2 (two) times daily.    [provider]  cetirizine (ZYRTEC) 10 MG tablet Take 10 mg by mouth daily as needed for allergies. 06/16/18   [provider]  diclofenac Sodium (VOLTAREN) 1 % GEL APPLY AS DIRECTED TO THE AFFECTED KNEE FOUR TIMES DAILY. Patient taking differently: Apply 4 g topically 4 (four) times daily. 08/04/20   Carole Civil, MD  donepezil (ARICEPT) 10 MG tablet Take 10 mg by mouth daily. 07/25/19   [provider]  furosemide (LASIX) 40 MG tablet Take 40 mg by mouth daily.    [provider]  LINZESS 145 MCG CAPS capsule Take 145 mcg by mouth daily as needed (bowel movement). 05/22/21   [provider]  memantine (NAMENDA) 10 MG tablet Take 10 mg by mouth 2 (two) times daily.    [provider]  Multiple Vitamin (MULTIVITAMIN WITH MINERALS) TABS tablet Take 1 tablet  by mouth daily.    [provider]  neomycin-polymyxin b-dexamethasone (MAXITROL) 3.5-10000-0.1 SUSP Place 1 drop into both eyes in the morning, at noon, and at bedtime. 06/09/21   [provider]  pantoprazole (PROTONIX) 40 MG tablet TAKE (1) TABLET BY MOUTH TWICE A DAY WITH MEALS (BREAKFAST AND SUPPER) Patient taking differently: Take 40 mg by mouth daily. 05/24/19   Carlis Stable, NP  potassium chloride (K-DUR) 10 MEQ tablet Take 1 tablet (10 mEq total) by mouth daily. 06/23/18   Gerlene Fee, NP  simvastatin (ZOCOR) 10 MG tablet Take 5 mg by mouth daily. 02/27/21   [provider]  traMADol (ULTRAM) 50 MG tablet Take 100 mg by mouth every 6 (six) hours as needed for moderate pain.    [provider]  umeclidinium-vilanterol (ANORO ELLIPTA) 62.5-25 MCG/INH AEPB Inhale 1 puff into the lungs daily. 06/23/18   Gerlene Fee, NP  zolpidem (AMBIEN) 5 MG tablet Take 1 tablet (5 mg total) by mouth at bedtime for 13 days. 06/12/21 06/25/21  Murlean Iba, MD     Allergies    Codeine, Codeine, Levofloxacin, Sulfonamide derivatives, and Sulfa antibiotics   Review of Systems   Review of Systems Please see HPI for pertinent positives and negatives  Physical Exam BP 98/60 (BP Location: Left Arm)   Pulse 96   Temp 97.6 F (36.4 C) (  Oral)   Resp (!) 22   Ht 5' (1.524 m)   Wt 47.6 kg   SpO2 90%   BMI 20.51 kg/m   Physical Exam Vitals and nursing note reviewed.  Constitutional:      Appearance: Normal appearance.  HENT:     Head: Normocephalic and atraumatic.     Nose: Nose normal.     Mouth/Throat:     Mouth: Mucous membranes are moist.  Eyes:     Extraocular Movements: Extraocular movements intact.     Conjunctiva/sclera: Conjunctivae normal.  Cardiovascular:     Rate and Rhythm: Normal rate.     Pulses: Normal pulses.  Pulmonary:     Effort: Pulmonary effort is normal.     Breath sounds: Normal breath sounds.  Abdominal:     General:  Abdomen is flat.     Palpations: Abdomen is soft.     Tenderness: There is no abdominal tenderness.  Musculoskeletal:        General: Tenderness and deformity (L leg shortened and rotated) present. No swelling.     Cervical back: Neck supple. No tenderness.  Skin:    General: Skin is warm and dry.  Neurological:     General: No focal deficit present.     Mental Status: She is alert.  Psychiatric:        Mood and Affect: Mood normal.     ED Results / Procedures / Treatments   EKG None  Procedures Procedures  Medications Ordered in the ED Medications - No data to display  Initial Impression and Plan  Patient here with mechanical fall, L hip pain. Labs done after triage show CBC with mild anemia. CMP is unremarkable. Will add coags and T&S as part of the Hip Fx order set. I personally viewed the images from radiology studies and agree with radiologist interpretation: Xray shows L femoral neck fracture. Add CT head due to possible head injury and pCXR for pre-op eval. I spoke with Dr. Amedeo Kinsman, Ortho, who will review films. Plan admission to hospitalist when remainder of workup is complete.   ED Course       MDM Rules/Calculators/A&P Medical Decision Making Amount and/or Complexity of Data Reviewed Labs: ordered. Radiology: ordered.    Final Clinical Impression(s) / ED Diagnoses Final diagnoses:  None    Rx / DC Orders ED Discharge Orders     None

## 2021-09-23 NOTE — ED Triage Notes (Signed)
  Patient BIB EMS after a fall that occurred around 90 minutes ago.  Patient states she got out of bed to turn off her fan and had mechanical fall to the floor.  Patient endorses L leg pain.  Patient states she hit her head when she fell but does not take blood thinners.  Pain 7/10, throbbing.

## 2021-09-24 ENCOUNTER — Emergency Department (HOSPITAL_COMMUNITY): Payer: Medicare Other

## 2021-09-24 ENCOUNTER — Inpatient Hospital Stay (HOSPITAL_COMMUNITY): Payer: Medicare Other | Admitting: Anesthesiology

## 2021-09-24 ENCOUNTER — Encounter (HOSPITAL_COMMUNITY)
Admission: EM | Disposition: A | Discharge status: Skilled Nursing Facility | Payer: Self-pay | Source: Home / Self Care | Attending: Internal Medicine

## 2021-09-24 ENCOUNTER — Other Ambulatory Visit: Payer: Self-pay

## 2021-09-24 ENCOUNTER — Inpatient Hospital Stay (HOSPITAL_COMMUNITY): Payer: Medicare Other

## 2021-09-24 ENCOUNTER — Encounter (HOSPITAL_COMMUNITY): Payer: Self-pay | Admitting: Internal Medicine

## 2021-09-24 DIAGNOSIS — R339 Retention of urine, unspecified: Secondary | ICD-10-CM | POA: Diagnosis present

## 2021-09-24 DIAGNOSIS — F0390 Unspecified dementia without behavioral disturbance: Secondary | ICD-10-CM | POA: Diagnosis present

## 2021-09-24 DIAGNOSIS — K5909 Other constipation: Secondary | ICD-10-CM

## 2021-09-24 DIAGNOSIS — K219 Gastro-esophageal reflux disease without esophagitis: Secondary | ICD-10-CM | POA: Diagnosis present

## 2021-09-24 DIAGNOSIS — S72002A Fracture of unspecified part of neck of left femur, initial encounter for closed fracture: Secondary | ICD-10-CM | POA: Diagnosis not present

## 2021-09-24 DIAGNOSIS — Z8249 Family history of ischemic heart disease and other diseases of the circulatory system: Secondary | ICD-10-CM | POA: Diagnosis not present

## 2021-09-24 DIAGNOSIS — D72829 Elevated white blood cell count, unspecified: Secondary | ICD-10-CM

## 2021-09-24 DIAGNOSIS — W1830XA Fall on same level, unspecified, initial encounter: Secondary | ICD-10-CM | POA: Diagnosis present

## 2021-09-24 DIAGNOSIS — S72012A Unspecified intracapsular fracture of left femur, initial encounter for closed fracture: Secondary | ICD-10-CM | POA: Diagnosis present

## 2021-09-24 DIAGNOSIS — E875 Hyperkalemia: Secondary | ICD-10-CM | POA: Diagnosis present

## 2021-09-24 DIAGNOSIS — I509 Heart failure, unspecified: Secondary | ICD-10-CM | POA: Diagnosis not present

## 2021-09-24 DIAGNOSIS — Z881 Allergy status to other antibiotic agents status: Secondary | ICD-10-CM | POA: Diagnosis not present

## 2021-09-24 DIAGNOSIS — E782 Mixed hyperlipidemia: Secondary | ICD-10-CM | POA: Diagnosis present

## 2021-09-24 DIAGNOSIS — Z87891 Personal history of nicotine dependence: Secondary | ICD-10-CM

## 2021-09-24 DIAGNOSIS — N3281 Overactive bladder: Secondary | ICD-10-CM | POA: Diagnosis present

## 2021-09-24 DIAGNOSIS — E8809 Other disorders of plasma-protein metabolism, not elsewhere classified: Secondary | ICD-10-CM | POA: Diagnosis present

## 2021-09-24 DIAGNOSIS — R17 Unspecified jaundice: Secondary | ICD-10-CM | POA: Diagnosis present

## 2021-09-24 DIAGNOSIS — Z885 Allergy status to narcotic agent status: Secondary | ICD-10-CM | POA: Diagnosis not present

## 2021-09-24 DIAGNOSIS — M810 Age-related osteoporosis without current pathological fracture: Secondary | ICD-10-CM | POA: Diagnosis present

## 2021-09-24 DIAGNOSIS — Z79899 Other long term (current) drug therapy: Secondary | ICD-10-CM | POA: Diagnosis not present

## 2021-09-24 DIAGNOSIS — J189 Pneumonia, unspecified organism: Secondary | ICD-10-CM | POA: Diagnosis present

## 2021-09-24 DIAGNOSIS — J9621 Acute and chronic respiratory failure with hypoxia: Secondary | ICD-10-CM | POA: Diagnosis present

## 2021-09-24 DIAGNOSIS — J9601 Acute respiratory failure with hypoxia: Secondary | ICD-10-CM

## 2021-09-24 DIAGNOSIS — J449 Chronic obstructive pulmonary disease, unspecified: Secondary | ICD-10-CM

## 2021-09-24 DIAGNOSIS — I5032 Chronic diastolic (congestive) heart failure: Secondary | ICD-10-CM | POA: Diagnosis present

## 2021-09-24 DIAGNOSIS — D62 Acute posthemorrhagic anemia: Secondary | ICD-10-CM | POA: Diagnosis not present

## 2021-09-24 DIAGNOSIS — J441 Chronic obstructive pulmonary disease with (acute) exacerbation: Secondary | ICD-10-CM | POA: Diagnosis present

## 2021-09-24 DIAGNOSIS — F419 Anxiety disorder, unspecified: Secondary | ICD-10-CM | POA: Diagnosis present

## 2021-09-24 DIAGNOSIS — K581 Irritable bowel syndrome with constipation: Secondary | ICD-10-CM | POA: Diagnosis present

## 2021-09-24 DIAGNOSIS — Z882 Allergy status to sulfonamides status: Secondary | ICD-10-CM | POA: Diagnosis not present

## 2021-09-24 DIAGNOSIS — Y92009 Unspecified place in unspecified non-institutional (private) residence as the place of occurrence of the external cause: Secondary | ICD-10-CM | POA: Diagnosis not present

## 2021-09-24 DIAGNOSIS — J9611 Chronic respiratory failure with hypoxia: Secondary | ICD-10-CM | POA: Diagnosis present

## 2021-09-24 DIAGNOSIS — F17200 Nicotine dependence, unspecified, uncomplicated: Secondary | ICD-10-CM

## 2021-09-24 DIAGNOSIS — Z66 Do not resuscitate: Secondary | ICD-10-CM | POA: Diagnosis present

## 2021-09-24 DIAGNOSIS — F01511 Vascular dementia, unspecified severity, with agitation: Secondary | ICD-10-CM

## 2021-09-24 HISTORY — PX: HIP ARTHROPLASTY: SHX981

## 2021-09-24 LAB — COMPREHENSIVE METABOLIC PANEL
ALT: 22 U/L (ref 0–44)
AST: 27 U/L (ref 15–41)
Albumin: 3.4 g/dL — ABNORMAL LOW (ref 3.5–5.0)
Alkaline Phosphatase: 87 U/L (ref 38–126)
Anion gap: 8 (ref 5–15)
BUN: 24 mg/dL — ABNORMAL HIGH (ref 8–23)
CO2: 28 mmol/L (ref 22–32)
Calcium: 8.8 mg/dL — ABNORMAL LOW (ref 8.9–10.3)
Chloride: 99 mmol/L (ref 98–111)
Creatinine, Ser: 1.27 mg/dL — ABNORMAL HIGH (ref 0.44–1.00)
GFR, Estimated: 42 mL/min — ABNORMAL LOW (ref >60)
Glucose, Bld: 122 mg/dL — ABNORMAL HIGH (ref 70–99)
Potassium: 5.7 mmol/L — ABNORMAL HIGH (ref 3.5–5.1)
Sodium: 135 mmol/L (ref 135–145)
Total Bilirubin: 0.8 mg/dL (ref 0.3–1.2)
Total Protein: 6.8 g/dL (ref 6.5–8.1)

## 2021-09-24 LAB — BASIC METABOLIC PANEL
Anion gap: 9 (ref 5–15)
BUN: 25 mg/dL — ABNORMAL HIGH (ref 8–23)
CO2: 28 mmol/L (ref 22–32)
Calcium: 8.7 mg/dL — ABNORMAL LOW (ref 8.9–10.3)
Chloride: 98 mmol/L (ref 98–111)
Creatinine, Ser: 1.38 mg/dL — ABNORMAL HIGH (ref 0.44–1.00)
GFR, Estimated: 38 mL/min — ABNORMAL LOW (ref >60)
Glucose, Bld: 114 mg/dL — ABNORMAL HIGH (ref 70–99)
Potassium: 5.7 mmol/L — ABNORMAL HIGH (ref 3.5–5.1)
Sodium: 135 mmol/L (ref 135–145)

## 2021-09-24 LAB — CBC
HCT: 35.4 % — ABNORMAL LOW (ref 36.0–46.0)
Hemoglobin: 11.2 g/dL — ABNORMAL LOW (ref 12.0–15.0)
MCH: 27.9 pg (ref 26.0–34.0)
MCHC: 31.6 g/dL (ref 30.0–36.0)
MCV: 88.3 fL (ref 80.0–100.0)
Platelets: 235 10*3/uL (ref 150–400)
RBC: 4.01 MIL/uL (ref 3.87–5.11)
RDW: 13.6 % (ref 11.5–15.5)
WBC: 21.2 10*3/uL — ABNORMAL HIGH (ref 4.0–10.5)
nRBC: 0 % (ref 0.0–0.2)

## 2021-09-24 LAB — PROTIME-INR
INR: 1.1 (ref 0.8–1.2)
Prothrombin Time: 13.8 seconds (ref 11.4–15.2)

## 2021-09-24 LAB — TYPE AND SCREEN
ABO/RH(D): O POS
Antibody Screen: NEGATIVE

## 2021-09-24 LAB — MAGNESIUM: Magnesium: 2.2 mg/dL (ref 1.7–2.4)

## 2021-09-24 LAB — ABO/RH: ABO/RH(D): O POS

## 2021-09-24 LAB — PHOSPHORUS: Phosphorus: 3.8 mg/dL (ref 2.5–4.6)

## 2021-09-24 LAB — MRSA NEXT GEN BY PCR, NASAL: MRSA by PCR Next Gen: NOT DETECTED

## 2021-09-24 SURGERY — HEMIARTHROPLASTY, HIP, DIRECT ANTERIOR APPROACH, FOR FRACTURE
Anesthesia: Spinal | Site: Hip | Laterality: Left

## 2021-09-24 MED ORDER — FENTANYL CITRATE PF 50 MCG/ML IJ SOSY
25.0000 ug | PREFILLED_SYRINGE | INTRAMUSCULAR | Status: DC | PRN
  Administered 2021-09-24 – 2021-09-27 (×13): 25 ug via INTRAVENOUS
  Filled 2021-09-24 (×13): qty 1

## 2021-09-24 MED ORDER — SODIUM CHLORIDE 0.9 % IV SOLN: INTRAVENOUS | Status: DC

## 2021-09-24 MED ORDER — VANCOMYCIN HCL 1000 MG IV SOLR
INTRAVENOUS | Status: AC
  Filled 2021-09-24: qty 20

## 2021-09-24 MED ORDER — MEMANTINE HCL 10 MG PO TABS
10.0000 mg | ORAL_TABLET | Freq: Two times a day (BID) | ORAL | Status: DC
  Administered 2021-09-24 – 2021-09-30 (×12): 10 mg via ORAL
  Filled 2021-09-24 (×12): qty 1

## 2021-09-24 MED ORDER — LINACLOTIDE 145 MCG PO CAPS: 145.0000 ug | ORAL_CAPSULE | Freq: Every day | ORAL | Status: DC | PRN

## 2021-09-24 MED ORDER — ALBUTEROL SULFATE (2.5 MG/3ML) 0.083% IN NEBU
3.0000 mL | INHALATION_SOLUTION | RESPIRATORY_TRACT | Status: DC | PRN
  Administered 2021-09-26 (×2): 3 mL via RESPIRATORY_TRACT
  Filled 2021-09-24 (×2): qty 3

## 2021-09-24 MED ORDER — UMECLIDINIUM-VILANTEROL 62.5-25 MCG/INH IN AEPB
1.0000 | INHALATION_SPRAY | Freq: Every day | RESPIRATORY_TRACT | Status: DC
  Administered 2021-09-26: 1 via RESPIRATORY_TRACT
  Filled 2021-09-24: qty 1
  Filled 2021-09-24: qty 14

## 2021-09-24 MED ORDER — FENTANYL CITRATE PF 50 MCG/ML IJ SOSY
PREFILLED_SYRINGE | INTRAMUSCULAR | Status: AC
  Filled 2021-09-24: qty 1

## 2021-09-24 MED ORDER — DONEPEZIL HCL 5 MG PO TABS
10.0000 mg | ORAL_TABLET | Freq: Every day | ORAL | Status: DC
  Administered 2021-09-25 – 2021-09-30 (×6): 10 mg via ORAL
  Filled 2021-09-24 (×7): qty 2

## 2021-09-24 MED ORDER — PANTOPRAZOLE SODIUM 40 MG PO TBEC
40.0000 mg | DELAYED_RELEASE_TABLET | Freq: Every day | ORAL | Status: DC
  Administered 2021-09-25 – 2021-09-30 (×6): 40 mg via ORAL
  Filled 2021-09-24 (×6): qty 1

## 2021-09-24 MED ORDER — ONDANSETRON HCL 4 MG PO TABS
4.0000 mg | ORAL_TABLET | Freq: Four times a day (QID) | ORAL | Status: DC | PRN
  Administered 2021-09-28: 4 mg via ORAL
  Filled 2021-09-24: qty 1

## 2021-09-24 MED ORDER — SIMVASTATIN 10 MG PO TABS
5.0000 mg | ORAL_TABLET | Freq: Every day | ORAL | Status: DC
  Administered 2021-09-25 – 2021-09-30 (×6): 5 mg via ORAL
  Filled 2021-09-24 (×6): qty 1

## 2021-09-24 MED ORDER — PHENYLEPHRINE HCL-NACL 20-0.9 MG/250ML-% IV SOLN
INTRAVENOUS | Status: DC | PRN
  Administered 2021-09-24: 50 ug/min via INTRAVENOUS

## 2021-09-24 MED ORDER — PHENYLEPHRINE 80 MCG/ML (10ML) SYRINGE FOR IV PUSH (FOR BLOOD PRESSURE SUPPORT)
PREFILLED_SYRINGE | INTRAVENOUS | Status: DC | PRN
  Administered 2021-09-24 (×5): 160 ug via INTRAVENOUS

## 2021-09-24 MED ORDER — SODIUM ZIRCONIUM CYCLOSILICATE 10 G PO PACK
10.0000 g | PACK | Freq: Once | ORAL | Status: AC
  Administered 2021-09-24: 10 g via ORAL
  Filled 2021-09-24: qty 1

## 2021-09-24 MED ORDER — ONDANSETRON HCL 4 MG/2ML IJ SOLN
4.0000 mg | Freq: Once | INTRAMUSCULAR | Status: AC
  Administered 2021-09-24: 4 mg via INTRAVENOUS
  Filled 2021-09-24: qty 2

## 2021-09-24 MED ORDER — ONDANSETRON HCL 4 MG/2ML IJ SOLN
4.0000 mg | Freq: Four times a day (QID) | INTRAMUSCULAR | Status: DC | PRN
  Administered 2021-09-24: 4 mg via INTRAVENOUS
  Filled 2021-09-24: qty 2

## 2021-09-24 MED ORDER — FENTANYL CITRATE (PF) 100 MCG/2ML IJ SOLN
INTRAMUSCULAR | Status: DC | PRN
  Administered 2021-09-24 (×2): 50 ug via INTRAVENOUS

## 2021-09-24 MED ORDER — CEFAZOLIN SODIUM-DEXTROSE 1-4 GM/50ML-% IV SOLN
1.0000 g | Freq: Two times a day (BID) | INTRAVENOUS | Status: AC
  Administered 2021-09-25 (×2): 1 g via INTRAVENOUS
  Filled 2021-09-24 (×3): qty 50

## 2021-09-24 MED ORDER — CHLORHEXIDINE GLUCONATE CLOTH 2 % EX PADS
6.0000 | MEDICATED_PAD | Freq: Every day | CUTANEOUS | Status: DC
  Administered 2021-09-24 – 2021-09-30 (×7): 6 via TOPICAL

## 2021-09-24 MED ORDER — VANCOMYCIN HCL 1 G IV SOLR
INTRAVENOUS | Status: DC | PRN
  Administered 2021-09-24: 1000 mg via TOPICAL

## 2021-09-24 MED ORDER — FENTANYL CITRATE (PF) 100 MCG/2ML IJ SOLN
INTRAMUSCULAR | Status: AC
  Filled 2021-09-24: qty 2

## 2021-09-24 MED ORDER — PROPOFOL 10 MG/ML IV BOLUS
INTRAVENOUS | Status: AC
  Filled 2021-09-24: qty 20

## 2021-09-24 MED ORDER — SODIUM CHLORIDE 0.9 % IR SOLN
Status: DC | PRN
  Administered 2021-09-24: 3000 mL

## 2021-09-24 MED ORDER — PROPOFOL 500 MG/50ML IV EMUL
INTRAVENOUS | Status: DC | PRN
  Administered 2021-09-24: 50 ug/kg/min via INTRAVENOUS

## 2021-09-24 MED ORDER — BUPIVACAINE-EPINEPHRINE (PF) 0.5% -1:200000 IJ SOLN
INTRAMUSCULAR | Status: AC
  Filled 2021-09-24: qty 30

## 2021-09-24 MED ORDER — LACTATED RINGERS IV SOLN: INTRAVENOUS | Status: DC | PRN

## 2021-09-24 MED ORDER — VASOPRESSIN 20 UNIT/ML IV SOLN
INTRAVENOUS | Status: DC | PRN
  Administered 2021-09-24: .5 [IU] via INTRAVENOUS
  Administered 2021-09-24: 1 [IU] via INTRAVENOUS
  Administered 2021-09-24: .5 [IU] via INTRAVENOUS

## 2021-09-24 MED ORDER — 0.9 % SODIUM CHLORIDE (POUR BTL) OPTIME
TOPICAL | Status: DC | PRN
  Administered 2021-09-24: 1000 mL

## 2021-09-24 MED ORDER — FENTANYL CITRATE PF 50 MCG/ML IJ SOSY
25.0000 ug | PREFILLED_SYRINGE | Freq: Once | INTRAMUSCULAR | Status: AC
  Administered 2021-09-24: 25 ug via INTRAVENOUS

## 2021-09-24 SURGICAL SUPPLY — 56 items
APL PRP STRL LF DISP 70% ISPRP (MISCELLANEOUS) ×2
BIPOLAR DEPUY 49 (Hips) ×1 IMPLANT
BIT DRILL 2.8X128 (BIT) ×2 IMPLANT
BLADE SAGITTAL 25.0X1.27X90 (BLADE) ×2 IMPLANT
CEMENT HV SMART SET (Cement) IMPLANT
CEMENT RESTRICTOR DEPUY SZ 4 (Cement) IMPLANT
CHLORAPREP W/TINT 26 (MISCELLANEOUS) ×2 IMPLANT
CLOTH BEACON ORANGE TIMEOUT ST (SAFETY) ×2 IMPLANT
COVER LIGHT HANDLE STERIS (MISCELLANEOUS) ×4 IMPLANT
DRAPE HIP W/POCKET STRL (MISCELLANEOUS) ×2 IMPLANT
DRAPE SURG 17X23 STRL (DRAPES) ×2 IMPLANT
DRAPE U-SHAPE 47X51 STRL (DRAPES) ×2 IMPLANT
DRESSING AQUACEL AG ADV 3.5X12 (MISCELLANEOUS) ×2 IMPLANT
DRSG AQUACEL AG ADV 3.5X10 (GAUZE/BANDAGES/DRESSINGS) IMPLANT
DRSG AQUACEL AG ADV 3.5X12 (MISCELLANEOUS) ×1
DRSG MEPILEX SACRM 8.7X9.8 (GAUZE/BANDAGES/DRESSINGS) ×2 IMPLANT
ELECT REM PT RETURN 9FT ADLT (ELECTROSURGICAL) ×1
ELECTRODE REM PT RTRN 9FT ADLT (ELECTROSURGICAL) ×2 IMPLANT
GLOVE BIO SURGEON STRL SZ8 (GLOVE) ×8 IMPLANT
GLOVE BIOGEL PI IND STRL 7.0 (GLOVE) ×4 IMPLANT
GLOVE ECLIPSE 6.5 STRL STRAW (GLOVE) IMPLANT
GLOVE SRG 8 PF TXTR STRL LF DI (GLOVE) ×2 IMPLANT
GLOVE SURG UNDER POLY LF SZ8 (GLOVE) ×1
GOWN STRL REUS W/ TWL XL LVL3 (GOWN DISPOSABLE) ×2 IMPLANT
GOWN STRL REUS W/TWL LRG LVL3 (GOWN DISPOSABLE) ×4 IMPLANT
GOWN STRL REUS W/TWL XL LVL3 (GOWN DISPOSABLE) ×1
HANDPIECE INTERPULSE COAX TIP (DISPOSABLE) ×1
HEAD BIPOLAR DEPUY 49 (Hips) IMPLANT
HEAD FEM STD 28X+1.5 STRL (Hips) IMPLANT
INST SET MAJOR BONE (KITS) ×2 IMPLANT
IV NS IRRIG 3000ML ARTHROMATIC (IV SOLUTION) ×2 IMPLANT
KIT BLADEGUARD II DBL (SET/KITS/TRAYS/PACK) ×2 IMPLANT
KIT TURNOVER KIT A (KITS) ×2 IMPLANT
MANIFOLD NEPTUNE II (INSTRUMENTS) ×2 IMPLANT
MARKER SKIN DUAL TIP RULER LAB (MISCELLANEOUS) ×2 IMPLANT
NDL MAYO 6 CRC TAPER PT (NEEDLE) IMPLANT
NEEDLE MAYO 6 CRC TAPER PT (NEEDLE) ×1 IMPLANT
NS IRRIG 1000ML POUR BTL (IV SOLUTION) ×2 IMPLANT
PACK SURGICAL SETUP 50X90 (CUSTOM PROCEDURE TRAY) ×2 IMPLANT
PACK TOTAL JOINT (CUSTOM PROCEDURE TRAY) ×2 IMPLANT
PAD ARMBOARD 7.5X6 YLW CONV (MISCELLANEOUS) ×2 IMPLANT
PILLOW ABDUCTION MEDIUM (MISCELLANEOUS) IMPLANT
PILLOW HIP ABDUCTION LRG (ORTHOPEDIC SUPPLIES) IMPLANT
PRESSURIZER FEMORAL UNIV (MISCELLANEOUS) IMPLANT
SET BASIN LINEN APH (SET/KITS/TRAYS/PACK) ×2 IMPLANT
SET HNDPC FAN SPRY TIP SCT (DISPOSABLE) ×2 IMPLANT
STEM DIST FEM CENTRALIZR 11 (Hips) IMPLANT
STEM SUMMIT CEMENTED BASIC (Hips) IMPLANT
STRIP CLOSURE SKIN 1/2X4 (GAUZE/BANDAGES/DRESSINGS) IMPLANT
SUT MON AB 2-0 CT1 36 (SUTURE) ×2 IMPLANT
SUT VIC AB 1 CT1 27 (SUTURE) ×4
SUT VIC AB 1 CT1 27XBRD ANTBC (SUTURE) ×8 IMPLANT
SYR BULB IRRIG 60ML STRL (SYRINGE) ×4 IMPLANT
TOWER CARTRIDGE SMART MIX (DISPOSABLE) IMPLANT
TRAY FOLEY MTR SLVR 16FR STAT (SET/KITS/TRAYS/PACK) ×2 IMPLANT
YANKAUER SUCT 12FT TUBE ARGYLE (SUCTIONS) ×2 IMPLANT

## 2021-09-24 NOTE — Progress Notes (Signed)
Pt transferred to 302, report given to [place], [place]. Pt stable and no complains at this time.  Lear Ng, RN

## 2021-09-24 NOTE — Progress Notes (Signed)
The emergency room and patient was admitted early this morning by Dr. Bernadette Hoit and I am in current agreement with his assessment and plan.  Additional changes to the plan of care had been made accordingly.  The patient is an elderly 87 year old Caucasian female with a past medical history significant for COPD, GERD, chronic diastolic CHF, history of hyperlipidemia, osteoporosis, overactive bladder, anxiety and depression as well as other comorbidities who sustained a mechanical fall at home less than 2 hours prior to admission.  She states that she got out of bed to turn off her seen and had a mechanical fall.  She immediately then complained of left hip pain.  States that she did hit her head when she fell but she did not lose consciousness.  She rated the pain in her left hip a 7 out of 10 on the pain scale.  EMS was activated and patient was taken to the ED for further evaluation management.  She denies any chest pain, shortness of breath, headache fevers, chills, nausea, vomiting and of note she was recently admitted from June 09, 2021 until June 12, 2021.  Community-acquired pneumonia for which she was treated with IV ceftriaxone and azithromycin and discharged on oral antibiotics.  On presentation to the ED patient was noted to be tachypneic and intermittently tachycardic and was hypoxic on arrival with O2 saturation of 65% on room air and had to be placed on 4 L supplemental oxygen via nasal cannula.  O2 saturation work-up in the ED showed that she had a normocytic anemia, normal BMP except for blood glucose of 109 creatinine 1.1 which is within her baseline.  Repeat blood work showed that now she has a leukocytosis Hyperkalemia.  Chest x-ray was done and showed chronic interstitial changes without acute abnormalities and a head CT was done which showed no evidence of any acute intracranial abnormalities.  She did have a left hip x-ray which showed an acute subcapital left femoral neck fracture  infection versus varus angulation and mild osteoarthritis of the left hip and knee.  In the ED she was given IV fentanyl, and Zofran and orthopedic surgery Dr. Sylvester Harder was consulted and recommended admitting the patient for further evaluation and planning on taking her for surgical intervention later this afternoon.  Currently she is being admitted and treated for the following but not limited to:  Acute left subcapital femoral neck fracture -Left hip x-ray showed acute subcapital left femoral neck fracture, with impaction and varus angulation -Continue fall precaution and neurochecks -Continue IV fentanyl as needed -Patient will be kept n.p.o. in anticipation for likley surgical repair of left femoral neck fracture today -Orthopedic surgeon was already consulted and has seen patient in the morning per ED physician -She is getting preoperative antibiotics with cefazolin -Patient already received tranexamic acid revealed 1000 mg IV x1 -Consider PT/OT eval and treat status post surgical repair -Further care per orthopedic surgery   Acute respiratory failure with hypoxia -Continue supplemental oxygen via [place] to maintain O2 sat > 94% with plan to wean patient off this as tolerated. -SpO2: 95 % O2 Flow Rate (L/min): 4 L/min -CXR done and showed "Cardiac shadow is within normal limits. Previously seen hiatal hernia is not as well identified on today's exam. Aortic calcifications are seen. Old rib fractures are noted bilaterally. Diffuse interstitial fibrotic changes are seen. No focal infiltrate or effusion is noted" -Continuous Pulse Oximetry -Will need and Ambulatory Home O2 Screen  Leukocytosis -Patient's WBC went from 9.3 is now 21.2 -  Likely reactive in the setting of pain and will need to monitor for signs of infection and currently no overt infection noted that she is afebrile -We will however obtain urinalysis given her fall -Continue to monitor and trend and repeat WBC in the  a.m.  Hyperkalemia -Patient's potassium went from 4.4 is now 4.7 -Could be a component of hemolysis but we will give her dose of Lokelma 10 g 1 -She will need repeat BMP in 4 hours  Normocytic anemia -Patient's hemoglobin/hematocrit went from 11.0/35.2 and is now 11.2/35.4. -Check anemia panel in a.m. Continue to monitor for signs abdominal related -Repeat CBC in the AM  Chronic Diastolic CHF -Does not appear to be decompensated -Need to be extremely judicious and cautious with the use of fluids and she was initiated on IV fluids and normal saline at 75 MLS per hour by the orthopedic surgery team and will need to monitor extremely closely -We will continue monitoring volume status carefully however she is on 4 L   Mixed Hyperlipidemia -Continue Simvastatin 5 mg daily   GERD/GI Prophylaxis -Continue pantoprazole 40 mg p.o. daily   Dementia -Continue donepezil 10 mg p.o. daily and completed memantine 10 mg p.o. twice daily   COPD (not in acute exacerbation) -Continue Ventolin 2 puffs IH every 4 as needed wheezing or shortness of breath and Anoro Ellipta 1 puff IH daily   Constipation -Continue electrolyte 145 mcg daily.  Bowel movement   Tobacco Use Disorder -Patient dips tobacco -Patient counseled on tobacco abuse cessation  We will continue to monitor patient's clinical response to intervention and follow-up on specialist recommendations

## 2021-09-24 NOTE — Consult Note (Signed)
ORTHOPAEDIC CONSULTATION  REQUESTING PHYSICIAN: Kerney Elbe, DO  ASSESSMENT AND PLAN: 87 y.o. female with the following: Left Hip Displaced femoral neck fracture  This patient requires inpatient admission to the hospitalist, to include preoperative clearance and perioperative medical management  Patient sustained a displaced femoral neck fracture, that will require operative fixation.  This was discussed with the patient.  All questions were answered.  - Weight Bearing Status/Activity: NWB Left lower extremity  - Additional recommended labs/tests: Preop Labs: CBC, BMP, PT/INR, Chest XR, and EKG  -VTE Prophylaxis: Please hold prior to OR; to resume POD#1 at the discretion of the primary team  - Pain control: Recommend PO pain medications PRN; judicious use of narcotics  - Follow-up plan: F/u 10-14 days postop  -Procedures: Plan for OR once patient has been medically optimized  Plan for Left Hip hemiarthroplasty  Plan for surgery will be 09/24/2021.  Risks and benefits of the surgery, including, but not limited to infection, bleeding, persistent pain, need for further surgery, non-union, dislocation, instability and more severe complications associated with anesthesia were discussed with the patient.  The patient has elected to proceed.    Chief Complaint: Left hip pain  HPI: [name] is a 87 y.o. female who presented to the ED for evaluation after sustaining a mechanical fall.  Patient lives at home with family.  Last night, she got out of bed to turn on the fan.  She lost her balance.  She fell.  She hit her head.  She presented to the ED via EMS, and x-rays demonstrated a left femoral neck fracture.  She is comfortable when she is not moving.  She notes pain in her groin with even small amounts of movement.  Pain medications are helping.  No additional injuries were found in the emergency department.  Past Medical History:  Diagnosis Date   Anxiety    Chronic  diastolic (congestive) heart failure (HCC)    Chronic obstructive pulmonary disease, unspecified (HCC)    COPD (chronic obstructive pulmonary disease) (HCC)    GERD (gastroesophageal reflux disease)    Hyperlipidemia    IBS (irritable bowel syndrome)    Lumbago with sciatica    Osteoporosis    Overactive bladder    Pneumonia    Past Surgical History:  Procedure Laterality Date   COLONOSCOPY  10/2009   sigmoid diverticula, multiple tubulovillous adneomas, needs surveillance Oct 2013   ESOPHAGOGASTRODUODENOSCOPY  04/16/2011   Dr. Gala Romney: Noncritical Schatzkis ring( not manipulated because no dysphagia). Normal esophagus otherwise  large hiatal hernia. Gastric polyp-status post biopsy. Gastric erosions-staus post biopsy. Abnormal bulb-status post biopsy. benign small bowel, stomach biopsy with ulcerated gastric antral mucosa with foveolar hyperplasia and surface erosion, polyp with inflamed gastric antral mucosa    ESOPHAGOGASTRODUODENOSCOPY N/A 04/29/2014   Dr. Gala Romney: Noncritical Schatzki's ring large hiatal hernia. Retained gastric contents.GES with slight delay   HIP PINNING,CANNULATED Right 04/06/2018   Procedure: CANNULATED HIP PINNING;  Surgeon: Carole Civil, MD;  Location: [facility];  Service: Orthopedics;  Laterality: Right;   Social History   Socioeconomic History   Marital status: Married    Spouse name: Not on file   Number of children: Not on file   Years of education: Not on file   Highest education level: Not on file  Occupational History   Not on file  Tobacco Use   Smoking status: Former    Types: Cigarettes    Passive exposure: Yes   Smokeless tobacco: Current  Types: Snuff  Vaping Use   Vaping Use: Never used  Substance and Sexual Activity   Alcohol use: No   Drug use: No   Sexual activity: Never  Other Topics Concern   Not on file  Social History Narrative   ** Merged History Encounter **       Social Determinants of Health   Financial Resource  Strain: Not on file  Food Insecurity: Not on file  Transportation Needs: Not on file  Physical Activity: Not on file  Stress: Not on file  Social Connections: Not on file   Family History  Problem Relation Age of Onset   Stroke Mother    Diabetes Mother    Heart disease Father    Colon cancer Neg Hx    Allergies  Allergen Reactions   Codeine Shortness Of Breath and Rash   Codeine Anaphylaxis   Levofloxacin Anaphylaxis    Patient was admitted to hospital with lung problems due to this medication   Sulfonamide Derivatives Hives and Shortness Of Breath   Sulfa Antibiotics Hives   Prior to Admission medications   Medication Sig Start Date End Date Taking? Authorizing Provider  albuterol (PROAIR HFA) 108 (90 Base) MCG/ACT inhaler Inhale 2 puffs into the lungs every 4 (four) hours as needed for wheezing or shortness of breath. 06/12/21   Johnson, Clanford L, MD  albuterol (PROVENTIL) (2.5 MG/3ML) 0.083% nebulizer solution Take 3 mLs (2.5 mg total) by nebulization every 6 (six) hours as needed for wheezing or shortness of breath. 06/23/18   Gerlene Fee, NP  ALPRAZolam Duanne Moron) 0.5 MG tablet Take 0.5 mg by mouth 2 (two) times daily.    [provider]  cetirizine (ZYRTEC) 10 MG tablet Take 10 mg by mouth daily as needed for allergies. 06/16/18   [provider]  diclofenac Sodium (VOLTAREN) 1 % GEL APPLY AS DIRECTED TO THE AFFECTED KNEE FOUR TIMES DAILY. Patient taking differently: Apply 4 g topically 4 (four) times daily. 08/04/20   Carole Civil, MD  donepezil (ARICEPT) 10 MG tablet Take 10 mg by mouth daily. 07/25/19   [provider]  furosemide (LASIX) 40 MG tablet Take 40 mg by mouth daily.    [provider]  LINZESS 145 MCG CAPS capsule Take 145 mcg by mouth daily as needed (bowel movement). 05/22/21   [provider]  memantine (NAMENDA) 10 MG tablet Take 10 mg by mouth 2 (two) times daily.    [provider]  Multiple  Vitamin (MULTIVITAMIN WITH MINERALS) TABS tablet Take 1 tablet by mouth daily.    [provider]  neomycin-polymyxin b-dexamethasone (MAXITROL) 3.5-10000-0.1 SUSP Place 1 drop into both eyes in the morning, at noon, and at bedtime. 06/09/21   [provider]  pantoprazole (PROTONIX) 40 MG tablet TAKE (1) TABLET BY MOUTH TWICE A DAY WITH MEALS (BREAKFAST AND SUPPER) Patient taking differently: Take 40 mg by mouth daily. 05/24/19   Carlis Stable, NP  potassium chloride (K-DUR) 10 MEQ tablet Take 1 tablet (10 mEq total) by mouth daily. 06/23/18   Gerlene Fee, NP  simvastatin (ZOCOR) 10 MG tablet Take 5 mg by mouth daily. 02/27/21   [provider]  traMADol (ULTRAM) 50 MG tablet Take 100 mg by mouth every 6 (six) hours as needed for moderate pain.    [provider]  umeclidinium-vilanterol (ANORO ELLIPTA) 62.5-25 MCG/INH AEPB Inhale 1 puff into the lungs daily. 06/23/18   Gerlene Fee, NP  zolpidem Lorrin Mais) 5  MG tablet Take 1 tablet (5 mg total) by mouth at bedtime for 13 days. 06/12/21 06/25/21  Murlean Iba, MD   DG Chest 1 View  Result Date: 09/24/2021 CLINICAL DATA:  Known left hip fracture EXAM: CHEST  1 VIEW COMPARISON:  06/10/2021 FINDINGS: Cardiac shadow is within normal limits. Previously seen hiatal hernia is not as well identified on today's exam. Aortic calcifications are seen. Old rib fractures are noted bilaterally. Diffuse interstitial fibrotic changes are seen. No focal infiltrate or effusion is noted. IMPRESSION: Chronic interstitial changes without acute abnormality. Electronically Signed   By: Inez Catalina M.D.   On: 09/24/2021 00:12   CT Head Wo Contrast  Result Date: 09/24/2021 CLINICAL DATA:  Status post fall. EXAM: CT HEAD WITHOUT CONTRAST TECHNIQUE: Contiguous axial images were obtained from the base of the skull through the vertex without intravenous contrast. RADIATION DOSE REDUCTION: This exam was performed according to the  departmental dose-optimization program which includes automated exposure control, adjustment of the mA and/or kV according to patient size and/or use of iterative reconstruction technique. COMPARISON:  April 10, 2018 FINDINGS: Brain: There is mild to moderate severity cerebral atrophy with widening of the extra-axial spaces and ventricular dilatation. There are areas of decreased attenuation within the white matter tracts of the supratentorial brain, consistent with microvascular disease changes. Vascular: There is calcification of the bilateral cavernous carotid arteries. Skull: A chronic deformity is seen involving the right mandibular condyle. Sinuses/Orbits: No acute finding. Other: None. IMPRESSION: Generalized cerebral atrophy and chronic white matter small vessel ischemic changes without evidence of an acute intracranial abnormality. Electronically Signed   By: Virgina Norfolk M.D.   On: 09/24/2021 00:11   DG Hip Unilat W or Wo Pelvis 2-3 Views Left  Result Date: 09/23/2021 CLINICAL DATA:  Fell 90 minutes ago, left leg pain EXAM: DG HIP (WITH OR WITHOUT PELVIS) 2-3V LEFT; LEFT FEMUR 2 VIEWS COMPARISON:  None Available. FINDINGS: Left hip: Frontal view of the pelvis as well as frontal and cross-table lateral views of the left hip are obtained. There is a subcapital left femoral neck fracture with impaction and varus angulation at the fracture site. No dislocation. Mild left hip osteoarthritis. Three cannulated screws traverse the right femoral neck. Remainder of the bony pelvis is unremarkable. Left femur: Frontal and lateral views of the left femur are obtained. There is an impacted subcapital left femoral neck fracture. Remaining portions of the left femur are unremarkable. Mild left knee osteoarthritis. Soft tissues are unremarkable. IMPRESSION: 1. Acute subcapital left femoral neck fracture, with impaction and varus angulation. 2. Mild osteoarthritis of the left hip and knee. Electronically Signed    By: Randa Ngo M.D.   On: 09/23/2021 23:18   DG Femur Min 2 Views Left  Result Date: 09/23/2021 CLINICAL DATA:  Fell 90 minutes ago, left leg pain EXAM: DG HIP (WITH OR WITHOUT PELVIS) 2-3V LEFT; LEFT FEMUR 2 VIEWS COMPARISON:  None Available. FINDINGS: Left hip: Frontal view of the pelvis as well as frontal and cross-table lateral views of the left hip are obtained. There is a subcapital left femoral neck fracture with impaction and varus angulation at the fracture site. No dislocation. Mild left hip osteoarthritis. Three cannulated screws traverse the right femoral neck. Remainder of the bony pelvis is unremarkable. Left femur: Frontal and lateral views of the left femur are obtained. There is an impacted subcapital left femoral neck fracture. Remaining portions of the left femur are unremarkable. Mild left knee osteoarthritis. Soft tissues are  unremarkable. IMPRESSION: 1. Acute subcapital left femoral neck fracture, with impaction and varus angulation. 2. Mild osteoarthritis of the left hip and knee. Electronically Signed   By: Randa Ngo M.D.   On: 09/23/2021 23:18     Family History Reviewed and non-contributory, no pertinent history of problems with bleeding or anesthesia    Review of Systems No fevers or chills No numbness or tingling No chest pain + shortness of breath, on oxygen at baseline No bowel or bladder dysfunction No GI distress No headaches    OBJECTIVE  Vitals:Patient Vitals for the past 8 hrs:  BP Temp Temp src Pulse Resp SpO2  09/24/21 0800 109/71 98.8 F (37.1 C) Axillary 76 16 95 %  09/24/21 0700 -- 99.1 F (37.3 C) Axillary -- -- --  09/24/21 0547 104/64 99.3 F (37.4 C) Oral -- -- 97 %  09/24/21 0500 96/65 -- -- 99 18 99 %  09/24/21 0430 (!) 87/59 -- -- (!) 105 (!) 24 95 %  09/24/21 0400 103/65 -- -- (!) 106 -- 96 %  09/24/21 0330 104/65 -- -- (!) 104 -- 95 %  09/24/21 0300 110/71 -- -- (!) 102 -- 94 %   General: Alert, no acute distress.  Poor  dentition Cardiovascular: Extremities are warm Respiratory: No cyanosis, no use of accessory musculature Skin: No lesions in the area of chief complaint  Neurologic: Sensation intact distally  Psychiatric: Patient is competent for consent with normal mood and affect Lymphatic: No swelling obvious and reported other than the area involved in the exam below Extremities  LLE: Extremity held in a fixed position.  ROM deferred due to known fracture.  Sensation is intact distally in the sural, saphenous, DP, SP, and plantar nerve distribution. 2+ DP pulse.  Toes are WWP.  Active motion intact in the TA/EHL/GS. RLE: Sensation is intact distally in the sural, saphenous, DP, SP, and plantar nerve distribution. 2+ DP pulse.  Toes are WWP.  Active motion intact in the TA/EHL/GS. Tolerates gentle ROM of the hip.  No pain with axial loading.     Test Results Imaging XR of the Left hip demonstrates a Displaced femoral neck fracture.  Labs cbc Recent Labs    09/23/21 2222 09/24/21 0648  WBC 9.3 21.2*  HGB 11.0* 11.2*  HCT 35.2* 35.4*  PLT 254 235     Labs coag Recent Labs    09/23/21 2333  INR 1.1    Recent Labs    09/23/21 2222 09/24/21 0648  NA 137 135  K 4.4 5.7*  CL 98 99  CO2 26 28  GLUCOSE 109* 122*  BUN 23 24*  CREATININE 1.11* 1.27*  CALCIUM 8.9 8.8*

## 2021-09-24 NOTE — Anesthesia Preprocedure Evaluation (Signed)
Anesthesia Evaluation  Patient identified by MRN, date of birth, ID band Patient awake and Patient confused    Reviewed: Allergy & Precautions, H&P , NPO status , Patient's Chart, lab work & pertinent test results, reviewed documented beta blocker date and time   Airway Mallampati: II  TM Distance: >3 FB Neck ROM: full    Dental no notable dental hx. (+) Poor Dentition, Missing   Pulmonary COPD, former smoker,    Pulmonary exam normal breath sounds clear to auscultation       Cardiovascular Exercise Tolerance: Good +CHF   Rhythm:regular Rate:Normal     Neuro/Psych PSYCHIATRIC DISORDERS Anxiety Depression Dementia  Neuromuscular disease    GI/Hepatic Neg liver ROS, hiatal hernia, GERD  Medicated,  Endo/Other  negative endocrine ROS  Renal/GU negative Renal ROS  negative genitourinary   Musculoskeletal   Abdominal   Peds  Hematology  (+) Blood dyscrasia, anemia ,   Anesthesia Other Findings One angulated top front tooth.  Reproductive/Obstetrics negative OB ROS                             Anesthesia Physical Anesthesia Plan  ASA: 3  Anesthesia Plan: Spinal   Post-op Pain Management:    Induction:   PONV Risk Score and Plan: Propofol infusion  Airway Management Planned:   Additional Equipment:   Intra-op Plan:   Post-operative Plan:   Informed Consent: I have reviewed the patients History and Physical, chart, labs and discussed the procedure including the risks, benefits and alternatives for the proposed anesthesia with the patient or authorized representative who has indicated his/her understanding and acceptance.     Dental Advisory Given  Plan Discussed with: CRNA  Anesthesia Plan Comments:         Anesthesia Quick Evaluation

## 2021-09-24 NOTE — Progress Notes (Signed)
PHARMACY NOTE:  ANTIMICROBIAL RENAL DOSAGE ADJUSTMENT  Current antimicrobial regimen includes a mismatch between antimicrobial dosage and estimated renal function.  As per policy approved by the Pharmacy & Therapeutics and Medical Executive Committees, the antimicrobial dosage will be adjusted accordingly.  Current antimicrobial dosage:  2g IV q8 x3  Indication: surgical prophylaxis  Renal Function:  Estimated Creatinine Clearance: 21.8 mL/min (A) (by C-G formula based on SCr of 1.38 mg/dL (H)). '[]'$      On intermittent HD, scheduled: '[]'$      On CRRT    Antimicrobial dosage has been changed to:  1g IV q12 x2  Additional comments:   Onnie Boer, PharmD, BCIDP, AAHIVP, CPP Infectious Disease Pharmacist 09/24/2021 6:04 PM

## 2021-09-24 NOTE — Op Note (Signed)
Orthopaedic Surgery Operative Note (CSN: [id number])  [name]  1934/10/04 Date of Surgery: 09/24/2021   Diagnoses:  Left displaced femoral neck fracture  Procedure: Cemented left hip hemiarthroplasty   Operative Finding Successful completion of the planned procedure.   Left hip bipolar, cemented.  Completed without issues.     Post-Op Diagnosis: Same Surgeons:Primary: Mordecai Rasmussen, MD Assistants: Franklyn Lor Location: [facility] OR ROOM 3 Anesthesia: Sedation plus regional anesthesia Antibiotics: Ancef 2 g with local vancomycin powder 1 g at the surgical site Tourniquet time:  N/A Estimated Blood Loss: 884 cc Complications: None Specimens: None  Implants: Implant Name Type Inv. Item Serial No. Manufacturer Lot No. LRB No. Used Action  CEMENT HV SMART SET - [id number] Cement CEMENT HV SMART SET  [employer] [id number] Left 1 Implanted  CEMENT HV SMART SET - [id number] Cement CEMENT HV SMART SET  [employer] [id number] Left 1 Implanted  BIPOLAR DEPUY 49 - [id number] Hips BIPOLAR DEPUY [place] [id number] Left 1 Implanted  STEM SUMMIT CEMENTED BASIC - [id number] Hips STEM SUMMIT CEMENTED BASIC  [employer] [id number] Left 1 Implanted  HEAD FEM STD 28X+1.5 STRL - [id number] Hips HEAD FEM STD 28X+1.5 STRL  [employer] [id number] Left 1 Implanted  STEM DIST FEM CENTRALIZR 11 - [id number] Hips STEM DIST FEM CENTRALIZR 11  [employer]PU[employer] ORTH[employer] [id number] Left 1 Implanted  CEMENT RESTRICTOR DEPUY SZ 4 - [id number] Cement CEMENT RESTRICTOR DEPUY SZ 4  [employer]PU[employer] ORTH[employer] [id number] Left 1 Implanted    Indications for Surgery:   [name] is a 87 y.o. female who sustained a mechanical fall.  She states that she was getting up to turn on the fan, while in bed last night, when she fell.  She had immediate pain in the left hip.  She presented to the ED via EMS.  She was noted to have a displaced left femoral neck fracture.  In order to restore form and function, I  recommended operative fixation in the form of a left hip hemiarthroplasty.  Benefits and risks of operative and nonoperative management were discussed prior to surgery with the patient and her family.  Risks including, but not limited to, infection, bleeding, nonunion, dislocation, persistent pain, instability, need for further surgery, and more severe complications associated with anesthesia were discussed. Informed consent form was completed.  All questions were answered.   Procedure:   The patient was identified properly. Informed consent was obtained and the surgical site was marked. The patient was taken to the OR where general anesthesia was induced.  The patient was positioned lateral with the left hip up.  The left leg was prepped and draped in the usual sterile fashion.  Timeout was performed before the beginning of the case.  Ancef dosing was confirmed prior to incision.  Patient received 1 g of TXA before the start of the case  We started by making an incision centered over the greater trochanter with a scalpel. We used the scalpel to continue to dissect to the fascia.  Electrocautery was used to help with hemostasis.  A cobb elevator was used to clear the IT band and then it was pierced with Bovie electrocautery. Mayo scissors were used to cut the fascia in a longitudinal fashion. The gluteus maximus fibers were bluntly split in line with their fibers.   The femur was slowly internally rotated, putting tension on the posterior structures. The short external rotators were identified and tagged with #1 vicryl.  Bovie electrocautery was used to  dissect the short external rotators off of the insertion onto the femur.  Next, we identified the capsule and made a T capsulotomy.  The capsule was then tagged with #1 Vicryl sutures.   Next, we visualized the femoral neck fracture. We identified the sciatic nerve by palpation and verified that it was not in danger during the dissection.    We then made a  neck cut approximately 15 mm above the lesser trochanter with a saw. We removed the femoral head. We used the box cutter to cut away some of the greater trochanter for ease of insertion of the stem. We then used the canal finder to locate the femoral canal.   Using the angle of the femoral neck as our guide for version, we broached sequentially. We trialed components and found appropriate fit and stability.  The patient would come to full extension of the hip. The hip was stable at 90 degrees of flexion, and 45 degrees of internal rotation.  Leg lengths were approximately equal.  We then removed the trials as well as the broach. We ensured there were no foreign bodies or bone within the acetabulum. We copiously irrigated the wound.  We measured and placed an appropriately sized cement restrictor within the canal.  Cement was mixed on the back table and when ready, cement was inserted into the canal and pressurized.  We then carefully placed our stem.  Excess cement was removed immediately.  We maintained pressure on the stem while the cement hardened.  An appropriately sized head was placed on to the stem and the hip was reduced. Again, we noted it was stable in the previously mentioned manipulations.   We repaired the posterior capsule.  Short external rotators repaired to the greater trochanter through bone tunnels. We closed the fascia of the iliotibial band and gluteus maximus with running and intterupted Vicryl sutures. We then closed Scarpa's fascia with Vicryl sutures. Skin was closed with 2-0 monocryl and staples.  Next an Aquacel dressing was placed.  A hip abduction pillow was secured.  Patient was awoken taken to PACU in stable condition.   Post-operative plan:  The patient will be WBAT on the operative extremity. Hip abduction pillow in place at all times when in bed Posterior hip precautions PT/OT    DVT prophylaxis per primary team, no orthopedic contraindications.   Hold until POD#1 Pain  control with PRN pain medication preferring oral medicines.   Follow up plan will be scheduled in approximately 14 days for incision check and XR.

## 2021-09-24 NOTE — Anesthesia Procedure Notes (Signed)
Spinal  Patient location during procedure: OR Start time: 09/24/2021 2:25 PM End time: 09/24/2021 2:28 PM Reason for block: surgical anesthesia Staffing Anesthesiologist: Louann Sjogren, MD Resident/CRNA: Gwyndolyn Saxon, CRNA Performed by: Jonna Munro, CRNA Authorized by: Louann Sjogren, MD   Spinal Block Patient position: right lateral decubitus Prep: ChloraPrep Patient monitoring: heart rate, cardiac monitor, continuous pulse ox and blood pressure Approach: midline Location: L3-4 Injection technique: single-shot Needle Needle type: Sprotte  Needle gauge: 24 G Needle length: 10 cm Assessment Sensory level: T6

## 2021-09-24 NOTE — Transfer of Care (Signed)
Immediate Anesthesia Transfer of Care Note  Patient: [name]  Procedure(s) Performed: ARTHROPLASTY BIPOLAR HIP (HEMIARTHROPLASTY) (Left: Hip)  Patient Location: PACU  Anesthesia Type:Spinal  Level of Consciousness: awake and patient cooperative  Airway & Oxygen Therapy: Patient Spontanous Breathing and Patient connected to face mask oxygen  Post-op Assessment: Report given to RN, Post -op Vital signs reviewed and stable and Patient moving all extremities X 4  Post vital signs: Reviewed and stable  Last Vitals:  Vitals Value Taken Time  BP 121/69   Temp    Pulse 92   Resp 20   SpO2 100     Last Pain:  Vitals:   09/24/21 1356  TempSrc:   PainSc: Asleep      Patients Stated Pain Goal: 0 (59/74/71 8550)  Complications: No notable events documented.

## 2021-09-24 NOTE — H&P (Signed)
History and Physical    Patient: [name] SAY:[id number] DOB: May 18, 1934 DOA: 09/23/2021 DOS: the patient was seen and examined on 09/24/2021 PCP: [facility], [facility]  Patient coming from: Home  Chief Complaint:  Chief Complaint  Patient presents with   Fall   HPI: [name] is an 87 y.o. female with medical history significant of COPD, GERD who presents to the emergency department via EMS after sustaining a fall at home less than 2 hours PTA.  Patient states that she got out of bed to turn off her fan and had a mechanical fall to the floor, she immediately complained of left leg pain, she also states that she hit her head when she fell, but she did not lose consciousness.  Left hip pain was rated as 7/10 on pain scale.  EMS was activated and patient was taken to the ED for further evaluation and management.  She denies chest pain, shortness of breath, headache, fever, chills, nausea, vomiting. She was recently admitted from 6/6 to 6/9 due to community-acquired pneumonia, during which she was treated with IV Rocephin and Zithromax with good results and was discharged home with oral antibiotics for 3 days to complete treatment.  ED Course:  In the emergency department, patient was tachypneic and intermittently tachycardic, BP was 126/101 she was hypoxic on arrival with O2 sat of 65% on room air, supplemental oxygen via [place] at 4 LPM was provided with improvement in O2 sat to 90%.  Work-up in the ED showed normocytic anemia and normal BMP except for blood glucose of 109 and creatinine of 1.11 (baseline creatinine at 0.9-1.11) Chest x-ray showed chronic interstitial changes without acute abnormality CT of head without contrast showed no evidence of an acute intracranial abnormality Left hip (with or without pelvis) showed: 1. Acute subcapital left femoral neck fracture, with impaction and varus angulation. 2. Mild osteoarthritis of the left hip and knee. IV fentanyl and Zofran were  given.  Orthopedic surgeon (Dr. Amedeo Kinsman) was consulted and recommended admitting patient with plan to follow-up with patient in the morning.  Hospitalist was asked to admit patient for further evaluation and management.   Review of Systems: Review of systems as noted in the HPI. All other systems reviewed and are negative.   Past Medical History:  Diagnosis Date   Anxiety    Chronic diastolic (congestive) heart failure (HCC)    Chronic obstructive pulmonary disease, unspecified (HCC)    COPD (chronic obstructive pulmonary disease) (HCC)    GERD (gastroesophageal reflux disease)    Hyperlipidemia    IBS (irritable bowel syndrome)    Lumbago with sciatica    Osteoporosis    Overactive bladder    Pneumonia    Past Surgical History:  Procedure Laterality Date   COLONOSCOPY  10/2009   sigmoid diverticula, multiple tubulovillous adneomas, needs surveillance Oct 2013   ESOPHAGOGASTRODUODENOSCOPY  04/16/2011   Dr. Gala Romney: Noncritical Schatzkis ring( not manipulated because no dysphagia). Normal esophagus otherwise  large hiatal hernia. Gastric polyp-status post biopsy. Gastric erosions-staus post biopsy. Abnormal bulb-status post biopsy. benign small bowel, stomach biopsy with ulcerated gastric antral mucosa with foveolar hyperplasia and surface erosion, polyp with inflamed gastric antral mucosa    ESOPHAGOGASTRODUODENOSCOPY N/A 04/29/2014   Dr. Gala Romney: Noncritical Schatzki's ring large hiatal hernia. Retained gastric contents.GES with slight delay   HIP PINNING,CANNULATED Right 04/06/2018   Procedure: CANNULATED HIP PINNING;  Surgeon: Carole Civil, MD;  Location: [facility];  Service: Orthopedics;  Laterality: Right;  Social History:  reports that she has quit smoking. Her smoking use included cigarettes. She has been exposed to tobacco smoke. Her smokeless tobacco use includes snuff. She reports that she does not drink alcohol and does not use drugs.   Allergies  Allergen Reactions    Codeine Shortness Of Breath and Rash   Codeine Anaphylaxis   Levofloxacin Anaphylaxis    Patient was admitted to hospital with lung problems due to this medication   Sulfonamide Derivatives Hives and Shortness Of Breath   Sulfa Antibiotics Hives    Family History  Problem Relation Age of Onset   Stroke Mother    Diabetes Mother    Heart disease Father    Colon cancer Neg Hx      Prior to Admission medications   Medication Sig Start Date End Date Taking? Authorizing Provider  albuterol (PROAIR HFA) 108 (90 Base) MCG/ACT inhaler Inhale 2 puffs into the lungs every 4 (four) hours as needed for wheezing or shortness of breath. 06/12/21   Johnson, Clanford L, MD  albuterol (PROVENTIL) (2.5 MG/3ML) 0.083% nebulizer solution Take 3 mLs (2.5 mg total) by nebulization every 6 (six) hours as needed for wheezing or shortness of breath. 06/23/18   Gerlene Fee, NP  ALPRAZolam Duanne Moron) 0.5 MG tablet Take 0.5 mg by mouth 2 (two) times daily.    [provider]  cetirizine (ZYRTEC) 10 MG tablet Take 10 mg by mouth daily as needed for allergies. 06/16/18   [provider]  diclofenac Sodium (VOLTAREN) 1 % GEL APPLY AS DIRECTED TO THE AFFECTED KNEE FOUR TIMES DAILY. Patient taking differently: Apply 4 g topically 4 (four) times daily. 08/04/20   Carole Civil, MD  donepezil (ARICEPT) 10 MG tablet Take 10 mg by mouth daily. 07/25/19   [provider]  furosemide (LASIX) 40 MG tablet Take 40 mg by mouth daily.    [provider]  LINZESS 145 MCG CAPS capsule Take 145 mcg by mouth daily as needed (bowel movement). 05/22/21   [provider]  memantine (NAMENDA) 10 MG tablet Take 10 mg by mouth 2 (two) times daily.    [provider]  Multiple Vitamin (MULTIVITAMIN WITH MINERALS) TABS tablet Take 1 tablet by mouth daily.    [provider]  neomycin-polymyxin b-dexamethasone (MAXITROL) 3.5-10000-0.1 SUSP Place 1 drop into both eyes in the  morning, at noon, and at bedtime. 06/09/21   [provider]  pantoprazole (PROTONIX) 40 MG tablet TAKE (1) TABLET BY MOUTH TWICE A DAY WITH MEALS (BREAKFAST AND SUPPER) Patient taking differently: Take 40 mg by mouth daily. 05/24/19   Carlis Stable, NP  potassium chloride (K-DUR) 10 MEQ tablet Take 1 tablet (10 mEq total) by mouth daily. 06/23/18   Gerlene Fee, NP  simvastatin (ZOCOR) 10 MG tablet Take 5 mg by mouth daily. 02/27/21   [provider]  traMADol (ULTRAM) 50 MG tablet Take 100 mg by mouth every 6 (six) hours as needed for moderate pain.    [provider]  umeclidinium-vilanterol (ANORO ELLIPTA) 62.5-25 MCG/INH AEPB Inhale 1 puff into the lungs daily. 06/23/18   Gerlene Fee, NP  zolpidem (AMBIEN) 5 MG tablet Take 1 tablet (5 mg total) by mouth at bedtime for 13 days. 06/12/21 06/25/21  Murlean Iba, MD    Physical Exam: BP (!) 126/101   Pulse 99   Temp 97.6 F (36.4 C) (Oral)   Resp (!) 30   Ht 5' (1.524 m)  Wt 47.6 kg   SpO2 92%   BMI 20.51 kg/m   General: 87 y.o. year-old female well developed well nourished in no acute distress.  Alert and oriented x3. HEENT: NCAT, EOMI Neck: Supple, trachea medial Cardiovascular: Tachycardia.  Regular rate and rhythm with no rubs or gallops.  No thyromegaly or JVD noted.  No lower extremity edema. 2/4 pulses in all 4 extremities. Respiratory: Clear to auscultation with no wheezes or rales. Good inspiratory effort. Abdomen: Soft, nontender nondistended with normal bowel sounds x4 quadrants. Muskuloskeletal: Tender to palpation of left hip.  Restricted range of motion of LLE due to pain.  Left leg was externally rotated and shortened.  Neuro: CN II-XII intact, strength 5/5 x 4, sensation, reflexes intact Skin: No ulcerative lesions noted or rashes Psychiatry: Judgement and insight appear normal. Mood is appropriate for condition and setting          Labs on Admission:  Basic Metabolic  Panel: Recent Labs  Lab 09/23/21 2222  NA 137  K 4.4  CL 98  CO2 26  GLUCOSE 109*  BUN 23  CREATININE 1.11*  CALCIUM 8.9   Liver Function Tests: Recent Labs  Lab 09/23/21 2222  AST 29  ALT 23  ALKPHOS 93  BILITOT 0.7  PROT 7.3  ALBUMIN 3.5   No results for input(s): "LIPASE", "AMYLASE" in the last 168 hours. No results for input(s): "AMMONIA" in the last 168 hours. CBC: Recent Labs  Lab 09/23/21 2222  WBC 9.3  NEUTROABS 7.3  HGB 11.0*  HCT 35.2*  MCV 89.1  PLT 254   Cardiac Enzymes: No results for input(s): "CKTOTAL", "CKMB", "CKMBINDEX", "TROPONINI" in the last 168 hours.  BNP (last 3 results) Recent Labs    05/13/21 2022  BNP 63.0    ProBNP (last 3 results) No results for input(s): "PROBNP" in the last 8760 hours.  CBG: No results for input(s): "GLUCAP" in the last 168 hours.  Radiological Exams on Admission: DG Chest 1 View  Result Date: 09/24/2021 CLINICAL DATA:  Known left hip fracture EXAM: CHEST  1 VIEW COMPARISON:  06/10/2021 FINDINGS: Cardiac shadow is within normal limits. Previously seen hiatal hernia is not as well identified on today's exam. Aortic calcifications are seen. Old rib fractures are noted bilaterally. Diffuse interstitial fibrotic changes are seen. No focal infiltrate or effusion is noted. IMPRESSION: Chronic interstitial changes without acute abnormality. Electronically Signed   By: Inez Catalina M.D.   On: 09/24/2021 00:12   CT Head Wo Contrast  Result Date: 09/24/2021 CLINICAL DATA:  Status post fall. EXAM: CT HEAD WITHOUT CONTRAST TECHNIQUE: Contiguous axial images were obtained from the base of the skull through the vertex without intravenous contrast. RADIATION DOSE REDUCTION: This exam was performed according to the departmental dose-optimization program which includes automated exposure control, adjustment of the mA and/or kV according to patient size and/or use of iterative reconstruction technique. COMPARISON:  April 10, 2018 FINDINGS: Brain: There is mild to moderate severity cerebral atrophy with widening of the extra-axial spaces and ventricular dilatation. There are areas of decreased attenuation within the white matter tracts of the supratentorial brain, consistent with microvascular disease changes. Vascular: There is calcification of the bilateral cavernous carotid arteries. Skull: A chronic deformity is seen involving the right mandibular condyle. Sinuses/Orbits: No acute finding. Other: None. IMPRESSION: Generalized cerebral atrophy and chronic white matter small vessel ischemic changes without evidence of an acute intracranial abnormality. Electronically Signed   By: Virgina Norfolk M.D.   On: 09/24/2021  00:11   DG Hip Unilat W or Wo Pelvis 2-3 Views Left  Result Date: 09/23/2021 CLINICAL DATA:  Fell 90 minutes ago, left leg pain EXAM: DG HIP (WITH OR WITHOUT PELVIS) 2-3V LEFT; LEFT FEMUR 2 VIEWS COMPARISON:  None Available. FINDINGS: Left hip: Frontal view of the pelvis as well as frontal and cross-table lateral views of the left hip are obtained. There is a subcapital left femoral neck fracture with impaction and varus angulation at the fracture site. No dislocation. Mild left hip osteoarthritis. Three cannulated screws traverse the right femoral neck. Remainder of the bony pelvis is unremarkable. Left femur: Frontal and lateral views of the left femur are obtained. There is an impacted subcapital left femoral neck fracture. Remaining portions of the left femur are unremarkable. Mild left knee osteoarthritis. Soft tissues are unremarkable. IMPRESSION: 1. Acute subcapital left femoral neck fracture, with impaction and varus angulation. 2. Mild osteoarthritis of the left hip and knee. Electronically Signed   By: Randa Ngo M.D.   On: 09/23/2021 23:18   DG Femur Min 2 Views Left  Result Date: 09/23/2021 CLINICAL DATA:  Fell 90 minutes ago, left leg pain EXAM: DG HIP (WITH OR WITHOUT PELVIS) 2-3V LEFT; LEFT  FEMUR 2 VIEWS COMPARISON:  None Available. FINDINGS: Left hip: Frontal view of the pelvis as well as frontal and cross-table lateral views of the left hip are obtained. There is a subcapital left femoral neck fracture with impaction and varus angulation at the fracture site. No dislocation. Mild left hip osteoarthritis. Three cannulated screws traverse the right femoral neck. Remainder of the bony pelvis is unremarkable. Left femur: Frontal and lateral views of the left femur are obtained. There is an impacted subcapital left femoral neck fracture. Remaining portions of the left femur are unremarkable. Mild left knee osteoarthritis. Soft tissues are unremarkable. IMPRESSION: 1. Acute subcapital left femoral neck fracture, with impaction and varus angulation. 2. Mild osteoarthritis of the left hip and knee. Electronically Signed   By: Randa Ngo M.D.   On: 09/23/2021 23:18    EKG: I independently viewed the EKG done and my findings are as followed: Normal sinus rhythm at a rate of 97 bpm with increased PR interval (222 ms)  Assessment/Plan Present on Admission:  Fracture of femoral neck, left, closed (HCC)  Acute respiratory failure with hypoxia (HCC)  Chronic constipation  Chronic obstructive pulmonary disease, unspecified (HCC)  Gastro-esophageal reflux disease without esophagitis  Hyperlipidemia, unspecified  Dementia ([place])  Tobacco use disorder  Principal Problem:   Fracture of femoral neck, left, closed ([place]) Active Problems:   Chronic constipation   Dementia (HCC)   Chronic obstructive pulmonary disease, unspecified (HCC)   Gastro-esophageal reflux disease without esophagitis   Hyperlipidemia, unspecified   Acute respiratory failure with hypoxia (HCC)   Tobacco use disorder  Acute left subcapital femoral neck fracture Left hip x-ray showed acute subcapital left femoral neck fracture, with impaction and varus angulation Continue fall precaution and neurochecks Continue IV  fentanyl as needed Patient will be kept n.p.o. in anticipation for possible surgical repair of left femoral neck fracture today Orthopedic surgeon was already consulted and will see patient in the morning per ED physician Consider PT/OT eval and treat status post surgical repair  Acute respiratory failure with hypoxia Continue supplemental oxygen via [place] to maintain O2 sat > 94% with plan to wean patient off this as tolerated.  Mixed hyperlipidemia Continue Zocor  GERD Continue Protonix  Dementia Continue Aricept and Namenda  COPD (not in  acute exacerbation) Continue Ventolin and Anoro Ellipta  Constipation Continue Linzess  Tobacco use disorder Patient dips tobacco Patient counseled on tobacco abuse cessation  DVT prophylaxis: SCDs  Code Status: DNR  Consults: Orthopedic surgery  Family Communication: None at bedside  Severity of Illness: The appropriate patient status for this patient is INPATIENT. Inpatient status is judged to be reasonable and necessary in order to provide the required intensity of service to ensure the patient's safety. The patient's presenting symptoms, physical exam findings, and initial radiographic and laboratory data in the context of their chronic comorbidities is felt to place them at high risk for further clinical deterioration. Furthermore, it is not anticipated that the patient will be medically stable for discharge from the hospital within 2 midnights of admission.   * I certify that at the point of admission it is my clinical judgment that the patient will require inpatient hospital care spanning beyond 2 midnights from the point of admission due to high intensity of service, high risk for further deterioration and high frequency of surveillance required.*  Author: Bernadette Hoit, DO 09/24/2021 4:26 AM  For on call review [email or web address].

## 2021-09-25 DIAGNOSIS — S72002A Fracture of unspecified part of neck of left femur, initial encounter for closed fracture: Secondary | ICD-10-CM | POA: Diagnosis not present

## 2021-09-25 DIAGNOSIS — J9601 Acute respiratory failure with hypoxia: Secondary | ICD-10-CM | POA: Diagnosis not present

## 2021-09-25 DIAGNOSIS — J449 Chronic obstructive pulmonary disease, unspecified: Secondary | ICD-10-CM | POA: Diagnosis not present

## 2021-09-25 DIAGNOSIS — K5909 Other constipation: Secondary | ICD-10-CM | POA: Diagnosis not present

## 2021-09-25 LAB — PHOSPHORUS: Phosphorus: 4 mg/dL (ref 2.5–4.6)

## 2021-09-25 LAB — CBC WITH DIFFERENTIAL/PLATELET
Abs Immature Granulocytes: 0.09 10*3/uL — ABNORMAL HIGH (ref 0.00–0.07)
Basophils Absolute: 0.1 10*3/uL (ref 0.0–0.1)
Basophils Relative: 0 %
Eosinophils Absolute: 0.1 10*3/uL (ref 0.0–0.5)
Eosinophils Relative: 1 %
HCT: 30.7 % — ABNORMAL LOW (ref 36.0–46.0)
Hemoglobin: 9.6 g/dL — ABNORMAL LOW (ref 12.0–15.0)
Immature Granulocytes: 1 %
Lymphocytes Relative: 5 %
Lymphs Abs: 0.8 10*3/uL (ref 0.7–4.0)
MCH: 28.2 pg (ref 26.0–34.0)
MCHC: 31.3 g/dL (ref 30.0–36.0)
MCV: 90 fL (ref 80.0–100.0)
Monocytes Absolute: 1.4 10*3/uL — ABNORMAL HIGH (ref 0.1–1.0)
Monocytes Relative: 9 %
Neutro Abs: 13.3 10*3/uL — ABNORMAL HIGH (ref 1.7–7.7)
Neutrophils Relative %: 84 %
Platelets: 161 10*3/uL (ref 150–400)
RBC: 3.41 MIL/uL — ABNORMAL LOW (ref 3.87–5.11)
RDW: 13.9 % (ref 11.5–15.5)
WBC: 15.8 10*3/uL — ABNORMAL HIGH (ref 4.0–10.5)
nRBC: 0 % (ref 0.0–0.2)

## 2021-09-25 LAB — COMPREHENSIVE METABOLIC PANEL
ALT: 18 U/L (ref 0–44)
AST: 28 U/L (ref 15–41)
Albumin: 3.1 g/dL — ABNORMAL LOW (ref 3.5–5.0)
Alkaline Phosphatase: 82 U/L (ref 38–126)
Anion gap: 11 (ref 5–15)
BUN: 31 mg/dL — ABNORMAL HIGH (ref 8–23)
CO2: 25 mmol/L (ref 22–32)
Calcium: 8.5 mg/dL — ABNORMAL LOW (ref 8.9–10.3)
Chloride: 101 mmol/L (ref 98–111)
Creatinine, Ser: 1.35 mg/dL — ABNORMAL HIGH (ref 0.44–1.00)
GFR, Estimated: 39 mL/min — ABNORMAL LOW (ref >60)
Glucose, Bld: 120 mg/dL — ABNORMAL HIGH (ref 70–99)
Potassium: 4.7 mmol/L (ref 3.5–5.1)
Sodium: 137 mmol/L (ref 135–145)
Total Bilirubin: 1.1 mg/dL (ref 0.3–1.2)
Total Protein: 6.4 g/dL — ABNORMAL LOW (ref 6.5–8.1)

## 2021-09-25 LAB — MAGNESIUM: Magnesium: 2.2 mg/dL (ref 1.7–2.4)

## 2021-09-25 MED ORDER — LIDOCAINE HCL (PF) 2 % IJ SOLN
INTRAMUSCULAR | Status: AC
  Filled 2021-09-25: qty 5

## 2021-09-25 MED ORDER — ENSURE ENLIVE PO LIQD
237.0000 mL | Freq: Two times a day (BID) | ORAL | Status: DC
  Administered 2021-09-25 – 2021-09-29 (×4): 237 mL via ORAL

## 2021-09-25 MED ORDER — PROPOFOL 500 MG/50ML IV EMUL
INTRAVENOUS | Status: AC
  Filled 2021-09-25: qty 150

## 2021-09-25 MED ORDER — PROPOFOL 500 MG/50ML IV EMUL
INTRAVENOUS | Status: AC
  Filled 2021-09-25: qty 50

## 2021-09-25 NOTE — Evaluation (Signed)
Physical Therapy Evaluation Patient Details Name: [name] MRN: [id number] DOB: 08-31-1934 Today's Date: 09/25/2021  History of Present Illness  [name] is an 87 y.o. female with medical history significant of COPD, GERD who presents to the emergency department via EMS after sustaining a fall at home less than 2 hours PTA.  Patient states that she got out of bed to turn off her fan and had a mechanical fall to the floor, she immediately complained of left leg pain, she also states that she hit her head when she fell, but she did not lose consciousness.  Left hip pain was rated as 7/10 on pain scale.  EMS was activated and patient was taken to the ED for further evaluation and management.  She denies chest pain, shortness of breath, headache, fever, chills, nausea, vomiting.  She was recently admitted from 6/6 to 6/9 due to community-acquired pneumonia, during which she was treated with IV Rocephin and Zithromax with good results and was discharged home with oral antibiotics for 3 days to complete treatment.   Clinical Impression  Patient limited for functional mobility as stated below secondary to BLE weakness, fatigue and poor standing balance. Patient requires assist for LE movement and to pull to seated EOB. She demonstrates good sitting balance and sitting tolerance at bedside. She requires cueing for sequencing and hand placement to transfer to standing with RW. She demonstrates fair standing balance with use of RW and is limited to a few small steps at bedside to ambulate to chair. Patient will benefit from continued physical therapy in hospital and recommended venue below to increase strength, balance, endurance for safe ADLs and gait.        Recommendations for follow up therapy are one component of a multi-disciplinary discharge planning process, led by the attending physician.  Recommendations may be updated based on patient status, additional functional criteria and insurance  authorization.  Follow Up Recommendations Skilled nursing-short term rehab (<3 hours/day) Can patient physically be transported by private vehicle: Yes    Assistance Recommended at Discharge Intermittent Supervision/Assistance  Patient can return home with the following  A lot of help with walking and/or transfers;A lot of help with bathing/dressing/bathroom    Equipment Recommendations None recommended by PT  Recommendations for Other Services       Functional Status Assessment Patient has had a recent decline in their functional status and demonstrates the ability to make significant improvements in function in a reasonable and predictable amount of time.     Precautions / Restrictions Precautions Precautions: Posterior Hip Restrictions Weight Bearing Restrictions: Yes LLE Weight Bearing: Weight bearing as tolerated      Mobility  Bed Mobility Overal bed mobility: Needs Assistance Bed Mobility: Sidelying to Sit   Sidelying to sit: Min assist, Mod assist, HOB elevated       General bed mobility comments: slow, labored    Transfers Overall transfer level: Needs assistance Equipment used: Rolling walker (2 wheels) Transfers: Sit to/from Stand, Bed to chair/wheelchair/BSC Sit to Stand: Mod assist   Step pivot transfers: Mod assist       General transfer comment: cueing for hand placement and sequencing    Ambulation/Gait Ambulation/Gait assistance: Min assist, Mod assist Gait Distance (Feet): 3 Feet Assistive device: Rolling walker (2 wheels) Gait Pattern/deviations: Trunk flexed Gait velocity: decreased     General Gait Details: labored, slow, unsteady steps at bedside with RW to chair  Stairs  Wheelchair Mobility    Modified Rankin (Stroke Patients Only)       Balance Overall balance assessment: Needs assistance Sitting-balance support: Bilateral upper extremity supported, Feet supported Sitting balance-Leahy Scale: Fair Sitting  balance - Comments: seated EOB   Standing balance support: Bilateral upper extremity supported, Reliant on assistive device for balance Standing balance-Leahy Scale: Fair                               Pertinent Vitals/Pain Pain Assessment Pain Assessment: Faces Faces Pain Scale: Hurts even more Pain Location: L leg/hip Pain Descriptors / Indicators: Grimacing, Moaning, Guarding Pain Intervention(s): Limited activity within patient's tolerance, Monitored during session, Repositioned, Patient requesting pain meds-RN notified    Home Living Family/patient expects to be discharged to:: Private residence Living Arrangements: Children;Other relatives Available Help at Discharge: Personal care attendant;Family;Available 24 hours/day Type of Home: Mobile home Home Access: Ramped entrance       Home Layout: One level Home Equipment: Shower seat;Hand held shower head;Grab bars - tub/shower;Grab bars - toilet;Rolling Walker (2 wheels);Hospital bed;Wheelchair - manual;BSC/3in1      Prior Function Prior Level of Function : Needs assist       Physical Assist : Mobility (physical);ADLs (physical) Mobility (physical): Bed mobility;Transfers;Gait;Stairs ADLs (physical): Bathing;IADLs Mobility Comments: household distances with RW and aide present if assistance needed ADLs Comments: has home aides 8 hours/day x 5 days/week, family assist also     Hand Dominance   Dominant Hand: Right    Extremity/Trunk Assessment   Upper Extremity Assessment Upper Extremity Assessment: Defer to OT evaluation    Lower Extremity Assessment Lower Extremity Assessment: Generalized weakness    Cervical / Trunk Assessment Cervical / Trunk Assessment: Kyphotic  Communication   Communication: No difficulties  Cognition Arousal/Alertness: Awake/alert Behavior During Therapy: WFL for tasks assessed/performed Overall Cognitive Status: Within Functional Limits for tasks assessed                                           General Comments      Exercises     Assessment/Plan    PT Assessment Patient needs continued PT services  PT Problem List Decreased strength;Decreased mobility;Decreased activity tolerance;Decreased balance;Pain       PT Treatment Interventions DME instruction;Therapeutic exercise;Gait training;Balance training;Stair training;Neuromuscular re-education;Functional mobility training;Therapeutic activities;Patient/family education    PT Goals (Current goals can be found in the Care Plan section)  Acute Rehab PT Goals Patient Stated Goal: return home PT Goal Formulation: With patient Time For Goal Achievement: 10/09/21 Potential to Achieve Goals: Good    Frequency Min 3X/week     Co-evaluation PT/OT/SLP Co-Evaluation/Treatment: Yes Reason for Co-Treatment: To address functional/ADL transfers PT goals addressed during session: Mobility/safety with mobility;Balance;Proper use of DME;Strengthening/ROM OT goals addressed during session: ADL's and self-care       AM-PAC PT "6 Clicks" Mobility  Outcome Measure Help needed turning from your back to your side while in a flat bed without using bedrails?: A Little Help needed moving from lying on your back to sitting on the side of a flat bed without using bedrails?: A Lot Help needed moving to and from a bed to a chair (including a wheelchair)?: A Lot Help needed standing up from a chair using your arms (e.g., wheelchair or bedside chair)?: A Lot Help needed to walk in hospital room?:  A Lot Help needed climbing 3-5 steps with a railing? : A Lot 6 Click Score: 13    End of Session Equipment Utilized During Treatment: Gait belt Activity Tolerance: Patient tolerated treatment well;Patient limited by pain Patient left: in chair;with call bell/phone within reach Nurse Communication: Mobility status PT Visit Diagnosis: Unsteadiness on feet (R26.81);Other abnormalities of gait and mobility  (R26.89);Muscle weakness (generalized) (M62.81);History of falling (Z91.81);Pain Pain - Right/Left: Left Pain - part of body: Hip    Time: 0815-0839 PT Time Calculation (min) (ACUTE ONLY): 24 min   Charges:   PT Evaluation $PT Eval Low Complexity: 1 Low PT Treatments $Therapeutic Activity: 8-22 mins        10:33 AM, 09/25/21 Mearl Latin PT, DPT Physical Therapist at [facility]

## 2021-09-25 NOTE — Anesthesia Postprocedure Evaluation (Signed)
Anesthesia Post Note  Patient: [name]  Procedure(s) Performed: ARTHROPLASTY BIPOLAR HIP (HEMIARTHROPLASTY) (Left: Hip)  Patient location during evaluation: Phase II Anesthesia Type: Spinal Level of consciousness: awake Pain management: pain level controlled Vital Signs Assessment: post-procedure vital signs reviewed and stable Respiratory status: spontaneous breathing and respiratory function stable Cardiovascular status: blood pressure returned to baseline and stable Postop Assessment: no headache and no apparent nausea or vomiting Anesthetic complications: no Comments: Late entry   No notable events documented.   Last Vitals:  Vitals:   09/24/21 2338 09/25/21 0404  BP: 92/72 100/62  Pulse: 95 96  Resp: 20 20  Temp: 36.8 C 37.6 C  SpO2: 99% 97%    Last Pain:  Vitals:   09/25/21 0404  TempSrc: Oral  PainSc:                  [name]

## 2021-09-25 NOTE — Progress Notes (Addendum)
   ORTHOPAEDIC PROGRESS NOTE  s/p Procedure(s): Left hip hemiarthroplasty, cemented  DOS: 09/24/2021  SUBJECTIVE: No issues overnight.  Some confusion this morning.  She is complaining of pain.  No family is at the bedside.  OBJECTIVE: PE:  Answers questions appropriately, but exhibits some confusion.  Hip abduction pillow is in place. Aquacel dressing is clean, dry and intact Sensation is intact the dorsum of the left foot. Active motion intact in the EHL/TA. Toes are warm and well-perfused.  Vitals:   09/25/21 0404 09/25/21 0812  BP: 100/62 112/71  Pulse: 96 98  Resp: 20 18  Temp: 99.7 F (37.6 C) 98.4 F (36.9 C)  SpO2: 97% 97%       Latest Ref Rng & Units 09/24/2021    6:48 AM 09/23/2021   10:22 PM 06/12/2021    4:40 AM  CBC  WBC 4.0 - 10.5 K/uL 21.2  9.3  8.3   Hemoglobin 12.0 - 15.0 g/dL 11.2  11.0  9.7   Hematocrit 36.0 - 46.0 % 35.4  35.2  31.2   Platelets 150 - 400 K/uL 235  254  268       ASSESSMENT: [name] is a 87 y.o. female doing well postoperatively.  Stable.  Waiting to work with physical therapy.  Foley can be removed today.  PLAN: Weightbearing: WBAT LLE Insicional and dressing care: Dressings left intact until follow-up and Reinforce dressings as needed Orthopedic device(s): Hip abduction pillow.  To be worn at all times while in bed. VTE prophylaxis: No orthopedic contraindications.  To start postop day #1.  Recommend 81 mg of aspirin twice daily. Pain control: As needed.  Judicious use of narcotics. Follow - up plan: We will plan to see the patient in clinic in approximately 2 weeks for staple removal.  If she is discharged to a nursing facility, the staples can be removed at 2 weeks, and I can see her at 6 weeks following surgery.  I am happy to see the patient in clinic at any time if she has any issues.   Contact information:     Kearia Yin A. Amedeo Kinsman, MD [facility] [address] [place],  [place]   [address] Phone: [phone] Fax: [phone]

## 2021-09-25 NOTE — Progress Notes (Signed)
PROGRESS NOTE    [name]  WCB:[id number] DOB: 1934-08-23 DOA: 09/23/2021 PCP: Alanson Puls [facility]   Brief Narrative:  The patient is an elderly 87 year old Caucasian female with a past medical history significant for COPD, GERD, chronic diastolic CHF, history of hyperlipidemia, osteoporosis, overactive bladder, anxiety and depression as well as other comorbidities who sustained a mechanical fall at home less than 2 hours prior to admission.  She states that she got out of bed to turn off her seen and had a mechanical fall.  She immediately then complained of left hip pain.  States that she did hit her head when she fell but she did not lose consciousness.  She rated the pain in her left hip a 7 out of 10 on the pain scale.  EMS was activated and patient was taken to the ED for further evaluation management.  She denies any chest pain, shortness of breath, headache fevers, chills, nausea, vomiting and of note she was recently admitted from June 09, 2021 until June 12, 2021.  Community-acquired pneumonia for which she was treated with IV ceftriaxone and azithromycin and discharged on oral antibiotics.   On presentation to the ED patient was noted to be tachypneic and intermittently tachycardic and was hypoxic on arrival with O2 saturation of 65% on room air and had to be placed on 4 L supplemental oxygen via nasal cannula.  O2 saturation work-up in the ED showed that she had a normocytic anemia, normal BMP except for blood glucose of 109 creatinine 1.1 which is within her baseline.   Repeat blood work showed that now she has a leukocytosis Hyperkalemia.  Chest x-ray was done and showed chronic interstitial changes without acute abnormalities and a head CT was done which showed no evidence of any acute intracranial abnormalities.  She did have a left hip x-ray which showed an acute subcapital left femoral neck fracture infection versus varus angulation and mild osteoarthritis of the left hip and  knee.  In the ED she was given IV fentanyl, and Zofran and orthopedic surgery Dr. Sylvester Harder was consulted and recommended admitting the patient for further evaluation and planning on taking her for surgical intervention which was done on 09/24/21.  She is postoperative day 1 for a cemented left hip hemiarthroplasty and surgery is recommending continuing the hip abduction pillow to be worn at all times in the bed with weightbearing as tolerated left lower extremity.  PT OT evaluated and recommending skilled nursing facility  Assessment and Plan:  Acute left subcapital femoral neck fracture status post left cemented hip hemiarthroplasty postoperative day 1 -Left hip x-ray showed acute subcapital left femoral neck fracture, with impaction and varus angulation -Continue fall precaution and neurochecks -Continue IV fentanyl as needed -Patient will be kept n.p.o. in anticipation for likley surgical repair of left femoral neck fracture today -Orthopedic surgeon was already consulted and has seen patient in the morning per ED physician -She is getting preoperative antibiotics with cefazolin -Patient already received tranexamic acid revealed 1000 mg IV x1 -Consider PT/OT eval and treat status post surgical repair and they are recommending SNF -Further care per orthopedic surgery and they are recommending weightbearing as tolerated in the left lower extremity and 81 mg of aspirin twice daily for DVT prophylaxis    Acute respiratory failure with hypoxia -Continue supplemental oxygen via [place] to maintain O2 sat > 94% with plan to wean patient off this as tolerated. -SpO2: 100 % O2 Flow Rate (L/min): 3.5 L/min -CXR done and  showed "Cardiac shadow is within normal limits. Previously seen hiatal hernia is not as well identified on today's exam. Aortic calcifications are seen. Old rib fractures are noted bilaterally. Diffuse interstitial fibrotic changes are seen. No focal infiltrate or effusion is noted" -Repeat  chest x-ray in a.m. -Continuous Pulse Oximetry -Will need and Ambulatory Home O2 Screen and will also add flutter valve incentive spirometry -We will stop IV fluid hydration now   Leukocytosis -Patient's WBC went from 9.3 is now 21.2 yesterday and is improving to 15.8 -Likely reactive in the setting of pain and will need to monitor for signs of infection and currently no overt infection noted that she is afebrile -We will however obtain urinalysis given her fall -Continue to monitor and trend and repeat WBC in the a.m.   Hyperkalemia -Patient's potassium is now improved to 4.7 -Could be a component of hemolysis but we will give her dose of Lokelma 10 g 1 -She will need repeat BMP in 4 hours   Normocytic anemia with postoperative anemia -Patient's hemoglobin/hematocrit went from 11.0/35.2 -> 11.2/35.4 -> and is now 9.6/30.7 and dropped in the setting of surgery -Check anemia panel in a.m. Continue to monitor for signs abdominal related -Repeat CBC in the AM   Chronic Diastolic CHF -Does not appear to be decompensated -IV fluid hydration stopped -Strict I's and O's and daily weights; patient is +1.084 L since admission -We will continue monitoring volume status carefully however she is on 4 L   Mixed Hyperlipidemia -Continue Simvastatin 5 mg daily   GERD/GI Prophylaxis -Continue pantoprazole 40 mg p.o. daily   Dementia -Continue donepezil 10 mg p.o. daily and completed memantine 10 mg p.o. twice daily   COPD (not in acute exacerbation) -Continue Ventolin 2 puffs IH every 4 as needed wheezing or shortness of breath and Anoro Ellipta 1 puff IH daily -She is wearing supplemental oxygen via nasal cannula need to wean   Constipation -We will initiate bowel regimen and continue Linaclotide 145 mcg po Daily pRN    Tobacco Use Disorder -Patient dips tobacco -Patient counseled on tobacco abuse cessation  DVT prophylaxis: SCDs Start: 09/24/21 0532; orthopedic surgery has  recommended aspirin 81 mg twice daily    Code Status: DNR Family Communication: No family currently at bedside  Disposition Plan:  Level of care: Med-Surg Status is: Inpatient Remains inpatient appropriate because: Needs SNF and insurance authorization and bed availability   Consultants:  Orthopedic surgery  Procedures:  As above  Antimicrobials:  Anti-infectives (From admission, onward)    Start     Dose/Rate Route Frequency Ordered Stop   09/25/21 0200  ceFAZolin (ANCEF) IVPB 1 g/50 mL premix        1 g 100 mL/hr over 30 Minutes Intravenous Every 12 hours 09/24/21 1757 09/25/21 1304   09/24/21 1619  vancomycin (VANCOCIN) powder  Status:  Discontinued          As needed 09/24/21 1620 09/24/21 1757   09/24/21 0600  ceFAZolin (ANCEF) IVPB 2g/100 mL premix        2 g 200 mL/hr over 30 Minutes Intravenous On call to O.R. 09/23/21 2338 09/24/21 1436       Subjective: Seen and examined at bedside and she is sitting in the chair and was complaining about some right foot numbness given the way that she is sitting and she states that she was sitting in a "chair too long and causing this foot to be numb.".  Denies any chest pain.  States her  hip is doing okay.  No other concerns or close at this time but wants to get back into the bed  Objective: Vitals:   09/24/21 2338 09/25/21 0404 09/25/21 0812 09/25/21 1330  BP: 92/72 100/62 112/71 109/68  Pulse: 95 96 98 96  Resp: '20 20 18 18  '$ Temp: 98.2 F (36.8 C) 99.7 F (37.6 C) 98.4 F (36.9 C) 98.1 F (36.7 C)  TempSrc: Oral Oral Oral Oral  SpO2: 99% 97% 97% 100%  Weight:      Height:        Intake/Output Summary (Last 24 hours) at 09/25/2021 1730 Last data filed at 09/25/2021 1300 Gross per 24 hour  Intake 683.99 ml  Output 350 ml  Net 333.99 ml   Filed Weights   09/23/21 2218  Weight: 47.6 kg   Examination: Physical Exam:  Constitutional: Thin elderly Caucasian female currently no acute distress appears slightly  uncomfortable Respiratory: Diminished to auscultation bilaterally with coarse breath sounds, no wheezing, rales, rhonchi or crackles. Normal respiratory effort and patient is not tachypenic. No accessory muscle use.  Wearing supplemental oxygen via nasal cannula Cardiovascular: RRR, no murmurs / rubs / gallops. S1 and S2 auscultated.  Minimal extremity edema Abdomen: Soft, non-tender, non-distended. Bowel sounds positive.  GU: Deferred. Musculoskeletal: No clubbing / cyanosis of digits/nails. No joint deformity upper and lower extremities. Skin: No rashes, lesions, ulcers on limited skin evaluation. No induration; Warm and dry.  Neurologic: CN 2-12 grossly intact with no focal deficits. Romberg sign and cerebellar reflexes not assessed.  Psychiatric: Normal judgment and insight. Alert and oriented x 2. Normal mood and appropriate affect.   Data Reviewed: I have personally reviewed following labs and imaging studies  CBC: Recent Labs  Lab 09/23/21 2222 09/24/21 0648 09/25/21 0921  WBC 9.3 21.2* 15.8*  NEUTROABS 7.3  --  13.3*  HGB 11.0* 11.2* 9.6*  HCT 35.2* 35.4* 30.7*  MCV 89.1 88.3 90.0  PLT 254 235 888   Basic Metabolic Panel: Recent Labs  Lab 09/23/21 2222 09/24/21 0648 09/24/21 1227 09/25/21 0921  NA 137 135 135 137  K 4.4 5.7* 5.7* 4.7  CL 98 99 98 101  CO2 '26 28 28 25  '$ GLUCOSE 109* 122* 114* 120*  BUN 23 24* 25* 31*  CREATININE 1.11* 1.27* 1.38* 1.35*  CALCIUM 8.9 8.8* 8.7* 8.5*  MG  --  2.2  --  2.2  PHOS  --  3.8  --  4.0   GFR: Estimated Creatinine Clearance: 22.3 mL/min (A) (by C-G formula based on SCr of 1.35 mg/dL (H)). Liver Function Tests: Recent Labs  Lab 09/23/21 2222 09/24/21 0648 09/25/21 0921  AST '29 27 28  '$ ALT '23 22 18  '$ ALKPHOS 93 87 82  BILITOT 0.7 0.8 1.1  PROT 7.3 6.8 6.4*  ALBUMIN 3.5 3.4* 3.1*   No results for input(s): "LIPASE", "AMYLASE" in the last 168 hours. No results for input(s): "AMMONIA" in the last 168 hours. Coagulation  Profile: Recent Labs  Lab 09/23/21 2333  INR 1.1   Cardiac Enzymes: No results for input(s): "CKTOTAL", "CKMB", "CKMBINDEX", "TROPONINI" in the last 168 hours. BNP (last 3 results) No results for input(s): "PROBNP" in the last 8760 hours. HbA1C: No results for input(s): "HGBA1C" in the last 72 hours. CBG: No results for input(s): "GLUCAP" in the last 168 hours. Lipid Profile: No results for input(s): "CHOL", "HDL", "LDLCALC", "TRIG", "CHOLHDL", "LDLDIRECT" in the last 72 hours. Thyroid Function Tests: No results for input(s): "TSH", "T4TOTAL", "FREET4", "T3FREE", "THYROIDAB"  in the last 72 hours. Anemia Panel: No results for input(s): "VITAMINB12", "FOLATE", "FERRITIN", "TIBC", "IRON", "RETICCTPCT" in the last 72 hours. Sepsis Labs: No results for input(s): "PROCALCITON", "LATICACIDVEN" in the last 168 hours.  Recent Results (from the past 240 hour(s))  MRSA Next Gen by PCR, Nasal     Status: None   Collection Time: 09/24/21  5:32 AM   Specimen: Nasal Mucosa; Nasal Swab  Result Value Ref Range Status   MRSA by PCR Next Gen NOT DETECTED NOT DETECTED Final    Comment: (NOTE) The GeneXpert MRSA Assay (FDA approved for NASAL specimens only), is one component of a comprehensive MRSA colonization surveillance program. It is not intended to diagnose MRSA infection nor to guide or monitor treatment for MRSA infections. Test performance is not FDA approved in patients less than 76 years old. Performed at [facility], [address], [place], [place] [address]     Radiology Studies: DG HIP UNILAT WITH PELVIS 2-3 VIEWS LEFT  Result Date: 09/24/2021 CLINICAL DATA:  Postop left hip. EXAM: DG HIP (WITH OR WITHOUT PELVIS) 2-3V LEFT COMPARISON:  Preoperative radiographs yesterday. FINDINGS: Left hip arthroplasty in expected alignment. No periprosthetic lucency or fracture. Recent postsurgical change includes air and edema in the soft tissues. Lateral skin staples in place. The bones are  diffusely under mineralized. Screws traverse right femoral neck. IMPRESSION: Left hip arthroplasty without immediate postoperative complication. Electronically Signed   By: Keith Rake M.D.   On: 09/24/2021 17:18   DG Chest 1 View  Result Date: 09/24/2021 CLINICAL DATA:  Known left hip fracture EXAM: CHEST  1 VIEW COMPARISON:  06/10/2021 FINDINGS: Cardiac shadow is within normal limits. Previously seen hiatal hernia is not as well identified on today's exam. Aortic calcifications are seen. Old rib fractures are noted bilaterally. Diffuse interstitial fibrotic changes are seen. No focal infiltrate or effusion is noted. IMPRESSION: Chronic interstitial changes without acute abnormality. Electronically Signed   By: Inez Catalina M.D.   On: 09/24/2021 00:12   CT Head Wo Contrast  Result Date: 09/24/2021 CLINICAL DATA:  Status post fall. EXAM: CT HEAD WITHOUT CONTRAST TECHNIQUE: Contiguous axial images were obtained from the base of the skull through the vertex without intravenous contrast. RADIATION DOSE REDUCTION: This exam was performed according to the departmental dose-optimization program which includes automated exposure control, adjustment of the mA and/or kV according to patient size and/or use of iterative reconstruction technique. COMPARISON:  April 10, 2018 FINDINGS: Brain: There is mild to moderate severity cerebral atrophy with widening of the extra-axial spaces and ventricular dilatation. There are areas of decreased attenuation within the white matter tracts of the supratentorial brain, consistent with microvascular disease changes. Vascular: There is calcification of the bilateral cavernous carotid arteries. Skull: A chronic deformity is seen involving the right mandibular condyle. Sinuses/Orbits: No acute finding. Other: None. IMPRESSION: Generalized cerebral atrophy and chronic white matter small vessel ischemic changes without evidence of an acute intracranial abnormality. Electronically  Signed   By: Virgina Norfolk M.D.   On: 09/24/2021 00:11   DG Hip Unilat W or Wo Pelvis 2-3 Views Left  Result Date: 09/23/2021 CLINICAL DATA:  Fell 90 minutes ago, left leg pain EXAM: DG HIP (WITH OR WITHOUT PELVIS) 2-3V LEFT; LEFT FEMUR 2 VIEWS COMPARISON:  None Available. FINDINGS: Left hip: Frontal view of the pelvis as well as frontal and cross-table lateral views of the left hip are obtained. There is a subcapital left femoral neck fracture with impaction and varus angulation at  the fracture site. No dislocation. Mild left hip osteoarthritis. Three cannulated screws traverse the right femoral neck. Remainder of the bony pelvis is unremarkable. Left femur: Frontal and lateral views of the left femur are obtained. There is an impacted subcapital left femoral neck fracture. Remaining portions of the left femur are unremarkable. Mild left knee osteoarthritis. Soft tissues are unremarkable. IMPRESSION: 1. Acute subcapital left femoral neck fracture, with impaction and varus angulation. 2. Mild osteoarthritis of the left hip and knee. Electronically Signed   By: Randa Ngo M.D.   On: 09/23/2021 23:18   DG Femur Min 2 Views Left  Result Date: 09/23/2021 CLINICAL DATA:  Fell 90 minutes ago, left leg pain EXAM: DG HIP (WITH OR WITHOUT PELVIS) 2-3V LEFT; LEFT FEMUR 2 VIEWS COMPARISON:  None Available. FINDINGS: Left hip: Frontal view of the pelvis as well as frontal and cross-table lateral views of the left hip are obtained. There is a subcapital left femoral neck fracture with impaction and varus angulation at the fracture site. No dislocation. Mild left hip osteoarthritis. Three cannulated screws traverse the right femoral neck. Remainder of the bony pelvis is unremarkable. Left femur: Frontal and lateral views of the left femur are obtained. There is an impacted subcapital left femoral neck fracture. Remaining portions of the left femur are unremarkable. Mild left knee osteoarthritis. Soft tissues are  unremarkable. IMPRESSION: 1. Acute subcapital left femoral neck fracture, with impaction and varus angulation. 2. Mild osteoarthritis of the left hip and knee. Electronically Signed   By: Randa Ngo M.D.   On: 09/23/2021 23:18    Scheduled Meds:  Chlorhexidine Gluconate Cloth  6 each Topical Daily   donepezil  10 mg Oral Daily   feeding supplement  237 mL Oral BID BM   memantine  10 mg Oral BID   pantoprazole  40 mg Oral Daily   simvastatin  5 mg Oral Daily   umeclidinium-vilanterol  1 puff Inhalation Daily   Continuous Infusions:  sodium chloride 75 mL/hr at 09/24/21 1910    LOS: 1 day   Raiford Noble, DO [facility] Available via Epic secure chat 7am-7pm After these hours, please refer to coverage provider listed on [email or web address] 09/25/2021, 5:30 PM

## 2021-09-25 NOTE — Plan of Care (Signed)
  Problem: Acute Rehab PT Goals(only PT should resolve) Goal: Pt Will Go Supine/Side To Sit Outcome: Progressing Flowsheets (Taken 09/25/2021 1034) Pt will go Supine/Side to Sit: with minimal assist Goal: Pt Will Go Sit To Supine/Side Outcome: Progressing Flowsheets (Taken 09/25/2021 1034) Pt will go Sit to Supine/Side: with minimal assist Goal: Patient Will Transfer Sit To/From Stand Outcome: Progressing Flowsheets (Taken 09/25/2021 1034) Patient will transfer sit to/from stand: with minimal assist Goal: Pt Will Transfer Bed To Chair/Chair To Bed Outcome: Progressing Flowsheets (Taken 09/25/2021 1034) Pt will Transfer Bed to Chair/Chair to Bed: with min assist Goal: Pt/caregiver will Perform Home Exercise Program Outcome: Progressing Flowsheets (Taken 09/25/2021 1034) Pt/caregiver will Perform Home Exercise Program:  For increased strengthening  For improved balance  With Supervision, verbal cues required/provided  10:35 AM, 09/25/21 Mearl Latin PT, DPT Physical Therapist at [facility]

## 2021-09-25 NOTE — Progress Notes (Signed)
Patient slept throughout the night after transfer up from ICU. Patient c/o pain this morning during her bath, PRN administered.

## 2021-09-25 NOTE — Plan of Care (Signed)
  Problem: Acute Rehab OT Goals (only OT should resolve) Goal: Pt. Will Perform Upper Body Bathing Flowsheets (Taken 09/25/2021 0941) Pt Will Perform Upper Body Bathing: with set-up Goal: Pt. Will Perform Lower Body Dressing Flowsheets (Taken 09/25/2021 0941) Pt Will Perform Lower Body Dressing: with min assist Goal: Pt. Will Transfer To Toilet Flowsheets (Taken 09/25/2021 [phone]) Pt Will Transfer to Toilet: with supervision    Arvil Persons, OTR/L

## 2021-09-25 NOTE — Evaluation (Addendum)
Occupational Therapy Evaluation Patient Details Name: [name] MRN: [id number] DOB: 12/09/1934 Today's Date: 09/25/2021   History of Present Illness [name] is an 87 y.o. female with medical history significant of COPD, GERD who presents to the emergency department via EMS after sustaining a fall at home less than 2 hours PTA.  Patient states that she got out of bed to turn off her fan and had a mechanical fall to the floor, she immediately complained of left leg pain, she also states that she hit her head when she fell, but she did not lose consciousness.  Left hip pain was rated as 7/10 on pain scale.  EMS was activated and patient was taken to the ED for further evaluation and management.  She denies chest pain, shortness of breath, headache, fever, chills, nausea, vomiting.  She was recently admitted from 6/6 to 6/9 due to community-acquired pneumonia, during which she was treated with IV Rocephin and Zithromax with good results and was discharged home with oral antibiotics for 3 days to complete treatment.   Clinical Impression   Pt was agreeable to OT/PT evaluation. Pt received supine in bed and left sitting in recliner chair. Pt reports that she lives in a mobile home with her son, grandson, and ex husband who provide assist as needed. She stated that she has a personal aid that comes 8 hours 5 days a week that assists her with bathing, cooking, and cleaning. She reports that she could get dressed independently prior to hospitalization. Pt stated that she has a BSC, shower chair, and rolling walker available at home. Pt completed bed mobility with mod assist, sit to stand transfer with mod assist and reliant on RW for balance, and transferred to chair with min assist. Pt would benefit from continued OT services while in the acute care setting to address functional mobility, ADL's, and transfers. Discharge recommendations are written below.      Recommendations for follow up therapy are  one component of a multi-disciplinary discharge planning process, led by the attending physician.  Recommendations may be updated based on patient status, additional functional criteria and insurance authorization.   Follow Up Recommendations  Skilled nursing-short term rehab (<3 hours/day)    Assistance Recommended at Discharge Frequent or constant Supervision/Assistance  Patient can return home with the following A lot of help with walking and/or transfers;Assistance with cooking/housework;A little help with bathing/dressing/bathroom    Functional Status Assessment  Patient has had a recent decline in their functional status and demonstrates the ability to make significant improvements in function in a reasonable and predictable amount of time.  Equipment Recommendations  None recommended by OT (Pt has shower chair and BSC)    Recommendations for Other Services       Precautions / Restrictions Precautions Precautions: None Restrictions Weight Bearing Restrictions: Yes LLE Weight Bearing: Weight bearing as tolerated      Mobility Bed Mobility Overal bed mobility: Needs Assistance Bed Mobility: Sidelying to Sit   Sidelying to sit: Min assist, Mod assist, HOB elevated (Slow labored movement)            Transfers Overall transfer level: Needs assistance Equipment used: Rolling walker (2 wheels) Transfers: Sit to/from Stand, Bed to chair/wheelchair/BSC Sit to Stand: Mod assist Stand pivot transfers: Mod assist                Balance Overall balance assessment: Needs assistance (supervision fro safety sittin gEOB) Sitting-balance support: Bilateral upper extremity supported, Feet supported Sitting balance-Leahy  Scale: Fair     Standing balance support: Bilateral upper extremity supported, Reliant on assistive device for balance Standing balance-Leahy Scale: Fair                             ADL either performed or assessed with clinical judgement    ADL Overall ADL's : Needs assistance/impaired Eating/Feeding: Set up;Sitting   Grooming: Wash/dry face;Set up;Sitting Grooming Details (indicate cue type and reason): Pt able to wash face with wash cloth with set up Upper Body Bathing: Minimal assistance;Set up   Lower Body Bathing: Maximal assistance   Upper Body Dressing : Minimal assistance;Set up   Lower Body Dressing: Maximal assistance Lower Body Dressing Details (indicate cue type and reason): max assist to don socks Toilet Transfer: Moderate assistance;Rolling walker (2 wheels)   Toileting- Clothing Manipulation and Hygiene: Moderate assistance;Sit to/from stand   Tub/ Banker: Moderate assistance;Shower seat;Rolling walker (2 wheels)   Functional mobility during ADLs: Minimal assistance;Rolling walker (2 wheels)       Vision Baseline Vision/History: 0 No visual deficits Ability to See in Adequate Light: 0 Adequate Patient Visual Report: No change from baseline Vision Assessment?: No apparent visual deficits                Pertinent Vitals/Pain Pain Assessment Pain Assessment: Faces Faces Pain Scale: Hurts even more Pain Location: L leg/hip Pain Descriptors / Indicators: Grimacing, Moaning, Guarding Pain Intervention(s): Limited activity within patient's tolerance, Monitored during session, Repositioned, Patient requesting pain meds-RN notified     Hand Dominance Right   Extremity/Trunk Assessment Upper Extremity Assessment Upper Extremity Assessment: Overall WFL for tasks assessed   Lower Extremity Assessment Lower Extremity Assessment: Defer to PT evaluation   Cervical / Trunk Assessment Cervical / Trunk Assessment: Kyphotic   Communication Communication Communication: No difficulties   Cognition Arousal/Alertness: Awake/alert Behavior During Therapy: WFL for tasks assessed/performed Overall Cognitive Status: Within Functional Limits for tasks assessed                                                         Home Living Family/patient expects to be discharged to:: Private residence Living Arrangements: Children;Other relatives Available Help at Discharge: Personal care attendant;Family;Available 24 hours/day Type of Home: Mobile home Home Access: Ramped entrance     Home Layout: One level     Bathroom Shower/Tub: Teacher, early years/pre: Handicapped height Bathroom Accessibility: Yes How Accessible: Accessible via walker Home Equipment: [place] held shower head;Grab bars - tub/shower;Grab bars - toilet;Rolling Walker (2 wheels);Hospital bed;Wheelchair - manual;BSC/3in1          Prior Functioning/Environment Prior Level of Function : Needs assist       Physical Assist : Mobility (physical);ADLs (physical) Mobility (physical): Bed mobility;Transfers;Gait;Stairs ADLs (physical): Bathing;IADLs Mobility Comments: household distances with RW and aide present if assistance needed ADLs Comments: has home aides 8 hours/day x 5 days/week, family assist also        OT Problem List: Decreased strength;Decreased activity tolerance;Impaired balance (sitting and/or standing)      OT Treatment/Interventions: Self-care/ADL training;Therapeutic exercise;Energy conservation;DME and/or AE instruction;Therapeutic activities;Patient/family education    OT Goals(Current goals can be found in the care plan section) Acute Rehab OT Goals Patient Stated Goal: to return home OT Goal Formulation:  With patient Time For Goal Achievement: 10/09/21 Potential to Achieve Goals: Good ADL Goals Pt Will Perform Upper Body Bathing: with set-up Pt Will Perform Lower Body Dressing: with min assist Pt Will Transfer to Toilet: with supervision  OT Frequency: Min 2X/week    Co-evaluation PT/OT/SLP Co-Evaluation/Treatment: Yes Reason for Co-Treatment: To address functional/ADL transfers   OT goals addressed during session: ADL's and self-care       AM-PAC OT "6 Clicks" Daily Activity     Outcome Measure Help from another person eating meals?: A Little Help from another person taking care of personal grooming?: A Little Help from another person toileting, which includes using toliet, bedpan, or urinal?: A Lot Help from another person bathing (including washing, rinsing, drying)?: A Lot Help from another person to put on and taking off regular upper body clothing?: A Little Help from another person to put on and taking off regular lower body clothing?: A Lot 6 Click Score: 15   End of Session Equipment Utilized During Treatment: Rolling walker (2 wheels);Gait belt Nurse Communication: Mobility status;Patient requests pain meds  Activity Tolerance: Patient tolerated treatment well Patient left: in chair;with call bell/phone within reach  OT Visit Diagnosis: Muscle weakness (generalized) (M62.81);Unsteadiness on feet (R26.81);History of falling (Z91.81)                Time: 5217-4715 OT Time Calculation (min): 24 min Charges:  OT General Charges $OT Visit: 1 Visit OT Evaluation $OT Eval Low Complexity: [place], OTR/L  09/25/2021, 10:10 AM

## 2021-09-25 NOTE — NC FL2 (Signed)
[place] LEVEL OF CARE SCREENING TOOL     IDENTIFICATION  Patient Name: [name] Birthdate: 29-Sep-1934 Sex: female Admission Date (Current Location): 09/23/2021  [place] and [place] Number:  [place] [facility] [id number] [place] and Address:  [place] [address] [place]      Provider Number: [id number]  Attending Physician Name and Address:  Kerney Elbe, DO  Relative Name and Phone Number:  Rebecka Apley (Daughter)   [phone]    Current Level of Care: Hospital Recommended Level of Care: [place] Prior Approval Number:    Date Approved/Denied:   PASRR Number: [id number]  Discharge Plan: SNF    Current Diagnoses: Patient Active Problem List   Diagnosis Date Noted   Fracture of femoral neck, left, closed ([place]) 09/24/2021   Acute respiratory failure with hypoxia ([place]) 06/10/2021   Tobacco use disorder 06/10/2021   Generalized weakness 06/10/2021   Bilateral conjunctivitis 06/10/2021   Chronic diastolic CHF (congestive heart failure) ([place]) 05/14/2021   Depression 05/14/2021   Hyperlipidemia    COPD exacerbation ([place]) 05/13/2021   Chronic pain 04/23/2019   General unsteadiness 04/23/2019   Polyarthropathy 04/23/2019   Pseudobulbar affect 04/23/2019   Tremor 04/23/2019   Chronic obstructive pulmonary disease, unspecified ([place])    Gastro-esophageal reflux disease without esophagitis    Hyperlipidemia, unspecified    Pulmonary fibrosis, unspecified (HCC)    Acute on chronic diastolic (congestive) heart failure (HCC)    Age-related osteoporosis without current pathological fracture    Anxiety disorder, unspecified    Chest pain, unspecified    Chronic diastolic (congestive) heart failure ([place])    Collapsed vertebra, not elsewhere classified, site unspecified, subsequent encounter for fracture with routine healing    Edema, unspecified    Lumbago with sciatica    Overactive bladder    Pain in  unspecified hand    Pressure ulcer of unspecified site, unspecified stage    Unspecified protein-calorie malnutrition ([place])    Acute diastolic heart failure ([place]) 09/30/2018   Dyspnea 09/29/2018   Pneumonia 06/30/2018   Acute CHF (congestive heart failure) ([place]) 06/29/2018   Vascular dementia without behavioral disturbance ([place]) 06/26/2018   CAP (community acquired pneumonia) 06/23/2018   Bilateral pleural effusion 06/23/2018   Leukocytosis 06/20/2018   Chronic anemia 06/20/2018   Dementia ([place]) 06/19/2018   Bilateral lower extremity edema 06/18/2018   Protein-calorie malnutrition, severe ([place]) 06/18/2018   S/P internal fixation right hip fracture cannulated screw placement 04/06/18 04/27/2018   Urinary tract infection associated with catheterization of urinary tract ([place]) 04/11/2018   Altered mental status 04/10/2018   Anxiety 04/10/2018   Hyponatremia 04/10/2018   UTI (urinary tract infection) due to urinary indwelling catheter ([place]) 04/10/2018   Chronic constipation 04/09/2018   Urinary retention 04/09/2018   Closed right hip fracture ([place]) 04/05/2018   GERD (gastroesophageal reflux disease) 04/05/2018   COPD with acute exacerbation ([place]) 04/05/2018   Hiatal hernia    Dyspepsia 04/04/2014   Hematochezia 04/30/2011   RUQ pain 03/25/2011   Adenomatous polyps 03/25/2011   ABDOMINAL PAIN, LEFT LOWER QUADRANT 10/16/2009   HYPERLIPIDEMIA 10/09/2009   BRONCHITIS 10/09/2009   Osteoporosis 10/09/2009    Orientation RESPIRATION BLADDER Height & Weight     Self, Situation, Place  O2 (3.5L) Incontinent Weight: 105 lb (47.6 kg) Height:  5' (152.4 cm)  BEHAVIORAL SYMPTOMS/MOOD NEUROLOGICAL BOWEL NUTRITION STATUS      Incontinent Diet (Heart healthy/carb modified)  AMBULATORY STATUS COMMUNICATION OF NEEDS Skin   Limited Assist  Verbally Surgical wounds (Left hip)                       Personal Care Assistance Level of Assistance  Bathing, Feeding, Dressing Bathing Assistance:  Limited assistance Feeding assistance: Independent Dressing Assistance: Limited assistance Total Care Assistance:  (N/a)   Functional Limitations Info  Sight, Hearing, Speech Sight Info: Adequate Hearing Info: Adequate Speech Info: Adequate    SPECIAL CARE FACTORS FREQUENCY  PT (By licensed PT)     PT Frequency: 5x/week              Contractures Contractures Info: Not present    Additional Factors Info  Code Status, Allergies, Psychotropic Code Status Info: DNR Allergies Info: Codeine, levofloxacin, sulfonamide derivatives, sulfa antibiotics Psychotropic Info: xanax         Current Medications (09/25/2021):  This is the current hospital active medication list Current Facility-Administered Medications  Medication Dose Route Frequency Provider Last Rate Last Admin   0.9 %  sodium chloride infusion   Intravenous Continuous Mordecai Rasmussen, MD 75 mL/hr at 09/24/21 1910 New Bag at 09/24/21 1910   albuterol (PROVENTIL) (2.5 MG/3ML) 0.083% nebulizer solution 3 mL  3 mL Nebulization Q4H PRN Mordecai Rasmussen, MD       ceFAZolin (ANCEF) IVPB 1 g/50 mL premix  1 g Intravenous Q12H Mordecai Rasmussen, MD 100 mL/hr at 09/25/21 1234 1 g at 09/25/21 1234   Chlorhexidine Gluconate Cloth 2 % PADS 6 each  6 each Topical Daily Mordecai Rasmussen, MD   6 each at 09/25/21 0849   donepezil (ARICEPT) tablet 10 mg  10 mg Oral Daily Mordecai Rasmussen, MD   10 mg at 09/25/21 0848   feeding supplement (ENSURE ENLIVE / ENSURE PLUS) liquid 237 mL  237 mL Oral BID BM Sheikh, Omair [place], DO   237 mL at 09/25/21 1234   fentaNYL (SUBLIMAZE) injection 25 mcg  25 mcg Intravenous Q2H PRN Mordecai Rasmussen, MD   25 mcg at 09/25/21 1222   linaclotide (LINZESS) capsule 145 mcg  145 mcg Oral Daily PRN Mordecai Rasmussen, MD       [facility]) tablet 10 mg  10 mg Oral BID Mordecai Rasmussen, MD   10 mg at 09/25/21 0848   ondansetron (ZOFRAN) tablet 4 mg  4 mg Oral Q6H PRN Mordecai Rasmussen, MD       Or   ondansetron [facility])  injection 4 mg  4 mg Intravenous Q6H PRN Mordecai Rasmussen, MD   4 mg at 09/24/21 1917   pantoprazole (PROTONIX) EC tablet 40 mg  40 mg Oral Daily Mordecai Rasmussen, MD   40 mg at 09/25/21 0848   simvastatin (ZOCOR) tablet 5 mg  5 mg Oral Daily Mordecai Rasmussen, MD   5 mg at 09/25/21 0848   umeclidinium-vilanterol (ANORO ELLIPTA) 62.5-25 MCG/INH 1 puff  1 puff Inhalation Daily Mordecai Rasmussen, MD         Discharge Medications: Please see discharge summary for a list of discharge medications.  Relevant Imaging Results:  Relevant Lab Results:   Additional Information SSN: [id number]  Ihor Gully, LCSW

## 2021-09-26 DIAGNOSIS — R338 Other retention of urine: Secondary | ICD-10-CM

## 2021-09-26 DIAGNOSIS — S72002A Fracture of unspecified part of neck of left femur, initial encounter for closed fracture: Secondary | ICD-10-CM | POA: Diagnosis not present

## 2021-09-26 DIAGNOSIS — D62 Acute posthemorrhagic anemia: Secondary | ICD-10-CM

## 2021-09-26 DIAGNOSIS — K5909 Other constipation: Secondary | ICD-10-CM | POA: Diagnosis not present

## 2021-09-26 DIAGNOSIS — J9601 Acute respiratory failure with hypoxia: Secondary | ICD-10-CM | POA: Diagnosis not present

## 2021-09-26 DIAGNOSIS — J449 Chronic obstructive pulmonary disease, unspecified: Secondary | ICD-10-CM | POA: Diagnosis not present

## 2021-09-26 LAB — CBC WITH DIFFERENTIAL/PLATELET
Abs Immature Granulocytes: 0.08 10*3/uL — ABNORMAL HIGH (ref 0.00–0.07)
Basophils Absolute: 0 10*3/uL (ref 0.0–0.1)
Basophils Relative: 0 %
Eosinophils Absolute: 0.1 10*3/uL (ref 0.0–0.5)
Eosinophils Relative: 0 %
HCT: 29 % — ABNORMAL LOW (ref 36.0–46.0)
Hemoglobin: 9.1 g/dL — ABNORMAL LOW (ref 12.0–15.0)
Immature Granulocytes: 1 %
Lymphocytes Relative: 7 %
Lymphs Abs: 1.1 10*3/uL (ref 0.7–4.0)
MCH: 28.1 pg (ref 26.0–34.0)
MCHC: 31.4 g/dL (ref 30.0–36.0)
MCV: 89.5 fL (ref 80.0–100.0)
Monocytes Absolute: 1.9 10*3/uL — ABNORMAL HIGH (ref 0.1–1.0)
Monocytes Relative: 12 %
Neutro Abs: 12.9 10*3/uL — ABNORMAL HIGH (ref 1.7–7.7)
Neutrophils Relative %: 80 %
Platelets: 165 10*3/uL (ref 150–400)
RBC: 3.24 MIL/uL — ABNORMAL LOW (ref 3.87–5.11)
RDW: 14 % (ref 11.5–15.5)
WBC: 16.1 10*3/uL — ABNORMAL HIGH (ref 4.0–10.5)
nRBC: 0 % (ref 0.0–0.2)

## 2021-09-26 LAB — COMPREHENSIVE METABOLIC PANEL
ALT: 12 U/L (ref 0–44)
AST: 26 U/L (ref 15–41)
Albumin: 3 g/dL — ABNORMAL LOW (ref 3.5–5.0)
Alkaline Phosphatase: 84 U/L (ref 38–126)
Anion gap: 10 (ref 5–15)
BUN: 30 mg/dL — ABNORMAL HIGH (ref 8–23)
CO2: 22 mmol/L (ref 22–32)
Calcium: 8.3 mg/dL — ABNORMAL LOW (ref 8.9–10.3)
Chloride: 104 mmol/L (ref 98–111)
Creatinine, Ser: 1.05 mg/dL — ABNORMAL HIGH (ref 0.44–1.00)
GFR, Estimated: 52 mL/min — ABNORMAL LOW (ref >60)
Glucose, Bld: 105 mg/dL — ABNORMAL HIGH (ref 70–99)
Potassium: 4 mmol/L (ref 3.5–5.1)
Sodium: 136 mmol/L (ref 135–145)
Total Bilirubin: 1.3 mg/dL — ABNORMAL HIGH (ref 0.3–1.2)
Total Protein: 6.3 g/dL — ABNORMAL LOW (ref 6.5–8.1)

## 2021-09-26 LAB — PHOSPHORUS: Phosphorus: 2.7 mg/dL (ref 2.5–4.6)

## 2021-09-26 LAB — MAGNESIUM: Magnesium: 2.4 mg/dL (ref 1.7–2.4)

## 2021-09-26 MED ORDER — ADULT MULTIVITAMIN W/MINERALS CH
1.0000 | ORAL_TABLET | Freq: Every day | ORAL | Status: DC
  Administered 2021-09-26 – 2021-09-30 (×5): 1 via ORAL
  Filled 2021-09-26 (×5): qty 1

## 2021-09-26 MED ORDER — FUROSEMIDE 40 MG PO TABS
40.0000 mg | ORAL_TABLET | Freq: Every day | ORAL | Status: DC
  Administered 2021-09-26 – 2021-09-28 (×3): 40 mg via ORAL
  Filled 2021-09-26 (×3): qty 1

## 2021-09-26 MED ORDER — ALPRAZOLAM 0.5 MG PO TABS
0.5000 mg | ORAL_TABLET | Freq: Two times a day (BID) | ORAL | Status: DC
  Administered 2021-09-26 – 2021-09-27 (×3): 0.5 mg via ORAL
  Filled 2021-09-26 (×3): qty 1

## 2021-09-26 MED ORDER — ZOLPIDEM TARTRATE 5 MG PO TABS
5.0000 mg | ORAL_TABLET | Freq: Every day | ORAL | Status: DC
  Administered 2021-09-26: 5 mg via ORAL
  Filled 2021-09-26: qty 1

## 2021-09-26 MED ORDER — TRAMADOL HCL 50 MG PO TABS
100.0000 mg | ORAL_TABLET | Freq: Two times a day (BID) | ORAL | Status: DC | PRN
  Administered 2021-09-26 – 2021-09-27 (×2): 100 mg via ORAL
  Filled 2021-09-26 (×2): qty 2

## 2021-09-26 MED ORDER — ALBUTEROL SULFATE (2.5 MG/3ML) 0.083% IN NEBU
2.5000 mg | INHALATION_SOLUTION | Freq: Three times a day (TID) | RESPIRATORY_TRACT | Status: DC
  Filled 2021-09-26: qty 3

## 2021-09-26 MED ORDER — TRAMADOL HCL 50 MG PO TABS: 100.0000 mg | ORAL_TABLET | Freq: Four times a day (QID) | ORAL | Status: DC | PRN

## 2021-09-26 MED ORDER — POLYETHYLENE GLYCOL 3350 17 G PO PACK
17.0000 g | PACK | Freq: Every day | ORAL | Status: DC
  Administered 2021-09-27 – 2021-09-30 (×4): 17 g via ORAL
  Filled 2021-09-26 (×4): qty 1

## 2021-09-26 MED ORDER — POTASSIUM CHLORIDE CRYS ER 10 MEQ PO TBCR
10.0000 meq | EXTENDED_RELEASE_TABLET | Freq: Every day | ORAL | Status: DC
  Administered 2021-09-26 – 2021-09-30 (×5): 10 meq via ORAL
  Filled 2021-09-26 (×8): qty 1

## 2021-09-26 MED ORDER — LORATADINE 10 MG PO TABS
10.0000 mg | ORAL_TABLET | Freq: Every day | ORAL | Status: DC
  Administered 2021-09-26 – 2021-09-30 (×5): 10 mg via ORAL
  Filled 2021-09-26 (×5): qty 1

## 2021-09-26 MED ORDER — SENNOSIDES-DOCUSATE SODIUM 8.6-50 MG PO TABS
1.0000 | ORAL_TABLET | Freq: Two times a day (BID) | ORAL | Status: DC
  Administered 2021-09-26 – 2021-09-30 (×8): 1 via ORAL
  Filled 2021-09-26 (×8): qty 1

## 2021-09-26 NOTE — Progress Notes (Signed)
   ORTHOPAEDIC PROGRESS NOTE  s/p Procedure(s): Left hip hemiarthroplasty, cemented  DOS: 09/24/2021  SUBJECTIVE: Had some loose stools overnight.  Hip abduction pillow needs to be replaced.  She states she is able to move more.  Less pain in her hip.  OBJECTIVE: PE:  Answers questions appropriately, some confusion.  Hip abduction pillow will be replaced Aquacel dressing is clean, dry and intact Sensation is intact the dorsum of the left foot. Active motion intact in the EHL/TA. Toes are warm and well-perfused.  Vitals:   09/25/21 2100 09/26/21 0803  BP: 124/81   Pulse: (!) 106   Resp: 18   Temp: 98.2 F (36.8 C)   SpO2: 94% 95%       Latest Ref Rng & Units 09/26/2021    5:24 AM 09/25/2021    9:21 AM 09/24/2021    6:48 AM  CBC  WBC 4.0 - 10.5 K/uL 16.1  15.8  21.2   Hemoglobin 12.0 - 15.0 g/dL 9.1  9.6  11.2   Hematocrit 36.0 - 46.0 % 29.0  30.7  35.4   Platelets 150 - 400 K/uL 165  161  235       ASSESSMENT: [name] is a 87 y.o. female stable postoperatively.  Awaiting placement.   PLAN: Weightbearing: WBAT LLE Insicional and dressing care: Dressings left intact until follow-up and Reinforce dressings as needed Orthopedic device(s): Hip abduction pillow.  To be worn at all times while in bed. VTE prophylaxis: No orthopedic contraindications.  To start postop day #1.  Recommend 81 mg of aspirin twice daily. Pain control: As needed.  Judicious use of narcotics. Follow - up plan: We will plan to see the patient in clinic in approximately 2 weeks for staple removal.  If she is discharged to a nursing facility, the staples can be removed at 2 weeks, and I can see her at 6 weeks following surgery.  I am happy to see the patient in clinic at any time if she has any issues.   Contact information:     Rylea Selway A. Amedeo Kinsman, MD [place] [address] [place],  [place]  [address] Phone: [phone] Fax: [phone]

## 2021-09-26 NOTE — Progress Notes (Signed)
Vital signs stable. Medicated for pain x1. Unable to urinate, bladder scan shows greater than 200cc. MD paged and ordered I&O cath. I&O cath performed and 600cc of clear yellow urine obtained.

## 2021-09-26 NOTE — Progress Notes (Signed)
PROGRESS NOTE    [name]  JOI:[id number] DOB: 1934-07-06 DOA: 09/23/2021 PCP: Alanson Puls [facility]   Brief Narrative:  The patient is an elderly 87 year old Caucasian female with a past medical history significant for COPD, GERD, chronic diastolic CHF, history of hyperlipidemia, osteoporosis, overactive bladder, anxiety and depression as well as other comorbidities who sustained a mechanical fall at home less than 2 hours prior to admission.  She states that she got out of bed to turn off her seen and had a mechanical fall.  She immediately then complained of left hip pain.  States that she did hit her head when she fell but she did not lose consciousness.  She rated the pain in her left hip a 7 out of 10 on the pain scale.  EMS was activated and patient was taken to the ED for further evaluation management.  She denies any chest pain, shortness of breath, headache fevers, chills, nausea, vomiting and of note she was recently admitted from June 09, 2021 until June 12, 2021.  Community-acquired pneumonia for which she was treated with IV ceftriaxone and azithromycin and discharged on oral antibiotics.   On presentation to the ED patient was noted to be tachypneic and intermittently tachycardic and was hypoxic on arrival with O2 saturation of 65% on room air and had to be placed on 4 L supplemental oxygen via nasal cannula.  O2 saturation work-up in the ED showed that she had a normocytic anemia, normal BMP except for blood glucose of 109 creatinine 1.1 which is within her baseline.   Repeat blood work showed that now she has a leukocytosis Hyperkalemia.  Chest x-ray was done and showed chronic interstitial changes without acute abnormalities and a head CT was done which showed no evidence of any acute intracranial abnormalities.  She did have a left hip x-ray which showed an acute subcapital left femoral neck fracture infection versus varus angulation and mild osteoarthritis of the left hip and  knee.  In the ED she was given IV fentanyl, and Zofran and orthopedic surgery Dr. Sylvester Harder was consulted and recommended admitting the patient for further evaluation and planning on taking her for surgical intervention which was done on 09/24/21.  She is postoperative day 1 for a cemented left hip hemiarthroplasty and surgery is recommending continuing the hip abduction pillow to be worn at all times in the bed with weightbearing as tolerated left lower extremity.  PT OT evaluated and recommending skilled nursing facility  Patient has been having some urinary retention if she continues to retain will need Foley catheter placement.  She was resumed on her home Lasix today.  She continues to wear supplemental oxygen but denies any dyspnea.  Oxygen requirement is weaning as well.  Assessment and Plan:  Acute left subcapital femoral neck fracture status post left cemented hip hemiarthroplasty postoperative day 2 -Left hip x-ray showed acute subcapital left femoral neck fracture, with impaction and varus angulation -Continue fall precaution and neurochecks -Continue IV fentanyl as needed -Patient will be kept n.p.o. in anticipation for likley surgical repair of left femoral neck fracture today -Orthopedic surgeon was already consulted and has seen patient in the morning per ED physician -She is getting preoperative antibiotics with cefazolin -Patient already received tranexamic acid revealed 1000 mg IV x1 -Consider PT/OT eval and treat status post surgical repair and they are recommending SNF -Further care per orthopedic surgery and they are recommending weightbearing as tolerated in the left lower extremity and 81 mg of aspirin  twice daily for DVT prophylaxis    Acute respiratory failure with hypoxia -Continue supplemental oxygen via [place] to maintain O2 sat > 94% with plan to wean patient off this as tolerated. -SpO2: 95 % O2 Flow Rate (L/min): 3 L/min -CXR done and showed "Cardiac shadow is within  normal limits. Previously seen hiatal hernia is not as well identified on today's exam. Aortic calcifications are seen. Old rib fractures are noted bilaterally. Diffuse interstitial fibrotic changes are seen. No focal infiltrate or effusion is noted" -Repeat chest x-ray in a.m. -Continuous Pulse Oximetry -Will need and Ambulatory Home O2 Screen and will also add flutter valve incentive spirometry -Stopped IVF now   Leukocytosis -Patient's WBC went from 9.3 is now 21.2 yesterday and is improving to 15.8 yesterday but slightly increased to 16.1 -Likely reactive in the setting of pain and will need to monitor for signs of infection and currently no overt infection noted that she is afebrile -We will however obtain urinalysis given her fall -Continue to monitor and trend and repeat WBC in the a.m.   Hyperkalemia -Patient's potassium is now improved to 4.0 -Could be a component of hemolysis but we will give her dose of Lokelma 10 g 1 -Continue to Monitor and Trend and repeat CMP in the AM   Acute urinary retention -Had some issues with urinating and had to have a INO cath.  If she continues to have issues with retention we will need to replace the Foley catheter and have a trial of void in the outpatient setting   Normocytic anemia with postoperative anemia -Patient's hemoglobin/hematocrit went from 11.0/35.2 -> 11.2/35.4 -> 9.6/30.7 -> 9.1/29.0 and dropped in the setting of surgery -Check anemia panel in a.m. Continue to monitor for signs abdominal related -Repeat CBC in the AM   Chronic Diastolic CHF -Does not appear to be decompensated -IV fluid hydration stopped and will resume her home Lasix -Strict I's and O's and daily weights; patient is +964 mL since admission -We will continue monitoring volume status carefully however she is on 4 L   Mixed Hyperlipidemia -Continue Simvastatin 5 mg daily   GERD/GI Prophylaxis -Continue pantoprazole 40 mg p.o. daily   Dementia -Continue  donepezil 10 mg p.o. daily and completed memantine 10 mg p.o. twice daily   COPD (not in acute exacerbation) -Continue Ventolin 2 puffs IH every 4 as needed wheezing or shortness of breath and Anoro Ellipta 1 puff IH daily -She is wearing supplemental oxygen via nasal cannula need to wean -Repeat CXR in the Am   Hypoalbuminemia -Patient's albumin level went from 3.4 is now 3.0 -Continue to monitor and trend and repeat CMP in the AM  Hyperbilirubinemia -Mild and reactive and trended up and went from 0.8 and is now 1.3 -Repeat CMP in a.m.  Constipation -We will initiate bowel regimen and continue Linaclotide 145 mcg po Daily pRN  -We will add senna docusate 1 tab p.o. twice daily as well as MiraLAX 17 g p.o. daily   Tobacco Use Disorder -Patient dips tobacco -Patient counseled on tobacco abuse cessation   DVT prophylaxis: SCDs Start: 09/24/21 0532    Code Status: DNR Family Communication: No family currently at bedside  Disposition Plan:  Level of care: Med-Surg Status is: Inpatient Remains inpatient appropriate because: Needs to go to SNF and needs a bed and insurance authorization   Consultants:  Orthopedic surgery  Procedures:  Cemented left hip hemiarthroplasty done by Dr. Wyvonna Plum  Antimicrobials:  Anti-infectives (From admission, onward)  Start     Dose/Rate Route Frequency Ordered Stop   09/25/21 0200  ceFAZolin (ANCEF) IVPB 1 g/50 mL premix        1 g 100 mL/hr over 30 Minutes Intravenous Every 12 hours 09/24/21 1757 09/25/21 1304   09/24/21 1619  vancomycin (VANCOCIN) powder  Status:  Discontinued          As needed 09/24/21 1620 09/24/21 1757   09/24/21 0600  ceFAZolin (ANCEF) IVPB 2g/100 mL premix        2 g 200 mL/hr over 30 Minutes Intravenous On call to O.R. 09/23/21 2338 09/24/21 1436       Subjective: Seen and examined at bedside and she is resting in the bed and felt okay.  Had issues with her urinary retention and had to have an in and out  catheter.  No nausea or vomiting.  Still remains on oxygen but is weaning.  We will need to continue monitor and trend carefully and repeat chest x-ray in the a.m. and have an ambulatory home O2 screen prior to discharge.  Objective: Vitals:   09/25/21 2100 09/26/21 0803 09/26/21 1207 09/26/21 1322  BP: 124/81  138/85   Pulse: (!) 106  98   Resp: 18  18   Temp: 98.2 F (36.8 C)  97.7 F (36.5 C)   TempSrc:      SpO2: 94% 95% 92% 95%  Weight:      Height:        Intake/Output Summary (Last 24 hours) at 09/26/2021 1713 Last data filed at 09/26/2021 1300 Gross per 24 hour  Intake 480 ml  Output 600 ml  Net -120 ml   Filed Weights   09/23/21 2218  Weight: 47.6 kg   Examination: Physical Exam:  Constitutional: Thin elderly Caucasian female currently no acute distress appears calm Respiratory: Diminished to auscultation bilaterally with coarse breath sounds and has some mild crackles but no appreciable wheezing, rales, rhonchi. Normal respiratory effort and patient is not tachypenic. No accessory muscle use.  Unlabored breathing but wearing supplemental oxygen via nasal cannula Cardiovascular: RRR, no murmurs / rubs / gallops. S1 and S2 auscultated.  Abdomen: Soft, non-tender, non-distended. Bowel sounds positive.  GU: Deferred. Musculoskeletal: No clubbing / cyanosis of digits/nails. No joint deformity upper and lower extremities.  Skin: No rashes, lesions, ulcers on limited skin evaluation. No induration; Warm and dry.  Neurologic: CN 2-12 grossly intact with no focal deficits. Romberg sign and cerebellar reflexes not assessed.  Psychiatric: Normal judgment and insight. Alert and oriented x 3. Normal mood and appropriate affect.   Data Reviewed: I have personally reviewed following labs and imaging studies  CBC: Recent Labs  Lab 09/23/21 2222 09/24/21 0648 09/25/21 0921 09/26/21 0524  WBC 9.3 21.2* 15.8* 16.1*  NEUTROABS 7.3  --  13.3* 12.9*  HGB 11.0* 11.2* 9.6* 9.1*   HCT 35.2* 35.4* 30.7* 29.0*  MCV 89.1 88.3 90.0 89.5  PLT 254 235 161 329   Basic Metabolic Panel: Recent Labs  Lab 09/23/21 2222 09/24/21 0648 09/24/21 1227 09/25/21 0921 09/26/21 0524  NA 137 135 135 137 136  K 4.4 5.7* 5.7* 4.7 4.0  CL 98 99 98 101 104  CO2 '26 28 28 25 22  '$ GLUCOSE 109* 122* 114* 120* 105*  BUN 23 24* 25* 31* 30*  CREATININE 1.11* 1.27* 1.38* 1.35* 1.05*  CALCIUM 8.9 8.8* 8.7* 8.5* 8.3*  MG  --  2.2  --  2.2 2.4  PHOS  --  3.8  --  4.0 2.7   GFR: Estimated Creatinine Clearance: 28.6 mL/min (A) (by C-G formula based on SCr of 1.05 mg/dL (H)). Liver Function Tests: Recent Labs  Lab 09/23/21 2222 09/24/21 0648 09/25/21 0921 09/26/21 0524  AST '29 27 28 26  '$ ALT '23 22 18 12  '$ ALKPHOS 93 87 82 84  BILITOT 0.7 0.8 1.1 1.3*  PROT 7.3 6.8 6.4* 6.3*  ALBUMIN 3.5 3.4* 3.1* 3.0*   No results for input(s): "LIPASE", "AMYLASE" in the last 168 hours. No results for input(s): "AMMONIA" in the last 168 hours. Coagulation Profile: Recent Labs  Lab 09/23/21 2333  INR 1.1   Cardiac Enzymes: No results for input(s): "CKTOTAL", "CKMB", "CKMBINDEX", "TROPONINI" in the last 168 hours. BNP (last 3 results) No results for input(s): "PROBNP" in the last 8760 hours. HbA1C: No results for input(s): "HGBA1C" in the last 72 hours. CBG: No results for input(s): "GLUCAP" in the last 168 hours. Lipid Profile: No results for input(s): "CHOL", "HDL", "LDLCALC", "TRIG", "CHOLHDL", "LDLDIRECT" in the last 72 hours. Thyroid Function Tests: No results for input(s): "TSH", "T4TOTAL", "FREET4", "T3FREE", "THYROIDAB" in the last 72 hours. Anemia Panel: No results for input(s): "VITAMINB12", "FOLATE", "FERRITIN", "TIBC", "IRON", "RETICCTPCT" in the last 72 hours. Sepsis Labs: No results for input(s): "PROCALCITON", "LATICACIDVEN" in the last 168 hours.  Recent Results (from the past 240 hour(s))  MRSA Next Gen by PCR, Nasal     Status: None   Collection Time: 09/24/21  5:32  AM   Specimen: Nasal Mucosa; Nasal Swab  Result Value Ref Range Status   MRSA by PCR Next Gen NOT DETECTED NOT DETECTED Final    Comment: (NOTE) The GeneXpert MRSA Assay (FDA approved for NASAL specimens only), is one component of a comprehensive MRSA colonization surveillance program. It is not intended to diagnose MRSA infection nor to guide or monitor treatment for MRSA infections. Test performance is not FDA approved in patients less than 84 years old. Performed at [facility], [address], [place], [place] [address]     Radiology Studies: DG HIP UNILAT WITH PELVIS 2-3 VIEWS LEFT  Result Date: 09/24/2021 CLINICAL DATA:  Postop left hip. EXAM: DG HIP (WITH OR WITHOUT PELVIS) 2-3V LEFT COMPARISON:  Preoperative radiographs yesterday. FINDINGS: Left hip arthroplasty in expected alignment. No periprosthetic lucency or fracture. Recent postsurgical change includes air and edema in the soft tissues. Lateral skin staples in place. The bones are diffusely under mineralized. Screws traverse right femoral neck. IMPRESSION: Left hip arthroplasty without immediate postoperative complication. Electronically Signed   By: Keith Rake M.D.   On: 09/24/2021 17:18    Scheduled Meds:  ALPRAZolam  0.5 mg Oral BID   Chlorhexidine Gluconate Cloth  6 each Topical Daily   donepezil  10 mg Oral Daily   feeding supplement  237 mL Oral BID BM   furosemide  40 mg Oral Daily   loratadine  10 mg Oral Daily   memantine  10 mg Oral BID   multivitamin with minerals  1 tablet Oral Daily   pantoprazole  40 mg Oral Daily   potassium chloride  10 mEq Oral Daily   simvastatin  5 mg Oral Daily   umeclidinium-vilanterol  1 puff Inhalation Daily   zolpidem  5 mg Oral QHS   Continuous Infusions:   LOS: 2 days   Raiford Noble, DO [facility] Available via Epic secure chat 7am-7pm After these hours, please refer to coverage provider listed on [email or web address] 09/26/2021, 5:13 PM

## 2021-09-27 ENCOUNTER — Inpatient Hospital Stay (HOSPITAL_COMMUNITY): Payer: Medicare Other

## 2021-09-27 DIAGNOSIS — J9601 Acute respiratory failure with hypoxia: Secondary | ICD-10-CM | POA: Diagnosis not present

## 2021-09-27 DIAGNOSIS — S72002A Fracture of unspecified part of neck of left femur, initial encounter for closed fracture: Secondary | ICD-10-CM | POA: Diagnosis not present

## 2021-09-27 DIAGNOSIS — J449 Chronic obstructive pulmonary disease, unspecified: Secondary | ICD-10-CM | POA: Diagnosis not present

## 2021-09-27 DIAGNOSIS — K5909 Other constipation: Secondary | ICD-10-CM | POA: Diagnosis not present

## 2021-09-27 LAB — COMPREHENSIVE METABOLIC PANEL
ALT: 11 U/L (ref 0–44)
AST: 20 U/L (ref 15–41)
Albumin: 2.7 g/dL — ABNORMAL LOW (ref 3.5–5.0)
Alkaline Phosphatase: 73 U/L (ref 38–126)
Anion gap: 6 (ref 5–15)
BUN: 27 mg/dL — ABNORMAL HIGH (ref 8–23)
CO2: 26 mmol/L (ref 22–32)
Calcium: 8.2 mg/dL — ABNORMAL LOW (ref 8.9–10.3)
Chloride: 105 mmol/L (ref 98–111)
Creatinine, Ser: 0.94 mg/dL (ref 0.44–1.00)
GFR, Estimated: 60 mL/min — ABNORMAL LOW (ref >60)
Glucose, Bld: 102 mg/dL — ABNORMAL HIGH (ref 70–99)
Potassium: 3.6 mmol/L (ref 3.5–5.1)
Sodium: 137 mmol/L (ref 135–145)
Total Bilirubin: 0.9 mg/dL (ref 0.3–1.2)
Total Protein: 5.9 g/dL — ABNORMAL LOW (ref 6.5–8.1)

## 2021-09-27 LAB — CBC WITH DIFFERENTIAL/PLATELET
Abs Immature Granulocytes: 0.07 10*3/uL (ref 0.00–0.07)
Basophils Absolute: 0.1 10*3/uL (ref 0.0–0.1)
Basophils Relative: 1 %
Eosinophils Absolute: 0.4 10*3/uL (ref 0.0–0.5)
Eosinophils Relative: 4 %
HCT: 26.9 % — ABNORMAL LOW (ref 36.0–46.0)
Hemoglobin: 8.4 g/dL — ABNORMAL LOW (ref 12.0–15.0)
Immature Granulocytes: 1 %
Lymphocytes Relative: 11 %
Lymphs Abs: 1.2 10*3/uL (ref 0.7–4.0)
MCH: 27.4 pg (ref 26.0–34.0)
MCHC: 31.2 g/dL (ref 30.0–36.0)
MCV: 87.6 fL (ref 80.0–100.0)
Monocytes Absolute: 1.6 10*3/uL — ABNORMAL HIGH (ref 0.1–1.0)
Monocytes Relative: 14 %
Neutro Abs: 8.1 10*3/uL — ABNORMAL HIGH (ref 1.7–7.7)
Neutrophils Relative %: 69 %
Platelets: 164 10*3/uL (ref 150–400)
RBC: 3.07 MIL/uL — ABNORMAL LOW (ref 3.87–5.11)
RDW: 13.9 % (ref 11.5–15.5)
WBC: 11.4 10*3/uL — ABNORMAL HIGH (ref 4.0–10.5)
nRBC: 0 % (ref 0.0–0.2)

## 2021-09-27 LAB — PHOSPHORUS: Phosphorus: 2.4 mg/dL — ABNORMAL LOW (ref 2.5–4.6)

## 2021-09-27 LAB — MAGNESIUM: Magnesium: 2.2 mg/dL (ref 1.7–2.4)

## 2021-09-27 MED ORDER — ORAL CARE MOUTH RINSE: 15.0000 mL | OROMUCOSAL | Status: DC | PRN

## 2021-09-27 MED ORDER — K PHOS MONO-SOD PHOS DI & MONO 155-852-130 MG PO TABS
500.0000 mg | ORAL_TABLET | Freq: Once | ORAL | Status: AC
  Administered 2021-09-27: 500 mg via ORAL
  Filled 2021-09-27: qty 2

## 2021-09-27 MED ORDER — TRAMADOL HCL 50 MG PO TABS
50.0000 mg | ORAL_TABLET | Freq: Two times a day (BID) | ORAL | Status: DC | PRN
  Administered 2021-09-28: 50 mg via ORAL
  Filled 2021-09-27: qty 1

## 2021-09-27 MED ORDER — IPRATROPIUM BROMIDE 0.02 % IN SOLN
0.5000 mg | Freq: Four times a day (QID) | RESPIRATORY_TRACT | Status: DC
  Administered 2021-09-27 – 2021-09-29 (×9): 0.5 mg via RESPIRATORY_TRACT
  Filled 2021-09-27 (×9): qty 2.5

## 2021-09-27 MED ORDER — ALPRAZOLAM 0.5 MG PO TABS
0.5000 mg | ORAL_TABLET | Freq: Two times a day (BID) | ORAL | Status: DC | PRN
  Administered 2021-09-29: 0.5 mg via ORAL
  Filled 2021-09-27: qty 1

## 2021-09-27 MED ORDER — DICLOFENAC SODIUM 1 % EX GEL
4.0000 g | Freq: Four times a day (QID) | CUTANEOUS | Status: DC
  Administered 2021-09-27 – 2021-09-30 (×12): 4 g via TOPICAL
  Filled 2021-09-27 (×2): qty 100

## 2021-09-27 MED ORDER — FUROSEMIDE 10 MG/ML IJ SOLN
20.0000 mg | Freq: Once | INTRAMUSCULAR | Status: AC
  Administered 2021-09-27: 20 mg via INTRAVENOUS
  Filled 2021-09-27: qty 2

## 2021-09-27 MED ORDER — LEVALBUTEROL HCL 0.63 MG/3ML IN NEBU
0.6300 mg | INHALATION_SOLUTION | Freq: Four times a day (QID) | RESPIRATORY_TRACT | Status: DC
  Administered 2021-09-27 – 2021-09-29 (×9): 0.63 mg via RESPIRATORY_TRACT
  Filled 2021-09-27 (×9): qty 3

## 2021-09-27 MED ORDER — ALBUTEROL SULFATE (2.5 MG/3ML) 0.083% IN NEBU: 2.5000 mg | INHALATION_SOLUTION | RESPIRATORY_TRACT | Status: DC | PRN

## 2021-09-27 NOTE — Progress Notes (Signed)
Patient out of bed to chair today, foley cath inserted for urinary retention per MD order, medicated for pain time 2 with relief

## 2021-09-27 NOTE — Progress Notes (Signed)
Notified MD of bladder scan results. New order to I/O pt. During bath patient voided a small amount. I/O yield:518m

## 2021-09-27 NOTE — Progress Notes (Signed)
PROGRESS NOTE    [name]  GEZ:[id number] DOB: Mar 14, 1934 DOA: 09/23/2021 PCP: Alanson Puls [facility]   Brief Narrative:  The patient is an elderly 87 year old Caucasian female with a past medical history significant for COPD, GERD, chronic diastolic CHF, history of hyperlipidemia, osteoporosis, overactive bladder, anxiety and depression as well as other comorbidities who sustained a mechanical fall at home less than 2 hours prior to admission.  She states that she got out of bed to turn off her seen and had a mechanical fall.  She immediately then complained of left hip pain.  States that she did hit her head when she fell but she did not lose consciousness.  She rated the pain in her left hip a 7 out of 10 on the pain scale.  EMS was activated and patient was taken to the ED for further evaluation management.  She denies any chest pain, shortness of breath, headache fevers, chills, nausea, vomiting and of note she was recently admitted from June 09, 2021 until June 12, 2021.  Community-acquired pneumonia for which she was treated with IV ceftriaxone and azithromycin and discharged on oral antibiotics.   On presentation to the ED patient was noted to be tachypneic and intermittently tachycardic and was hypoxic on arrival with O2 saturation of 65% on room air and had to be placed on 4 L supplemental oxygen via nasal cannula.  O2 saturation work-up in the ED showed that she had a normocytic anemia, normal BMP except for blood glucose of 109 creatinine 1.1 which is within her baseline.   Repeat blood work showed that now she has a leukocytosis Hyperkalemia.  Chest x-ray was done and showed chronic interstitial changes without acute abnormalities and a head CT was done which showed no evidence of any acute intracranial abnormalities.  She did have a left hip x-ray which showed an acute subcapital left femoral neck fracture infection versus varus angulation and mild osteoarthritis of the left hip and  knee.  In the ED she was given IV fentanyl, and Zofran and orthopedic surgery Dr. Sylvester Harder was consulted and recommended admitting the patient for further evaluation and planning on taking her for surgical intervention which was done on 09/24/21.  She is postoperative day 1 for a cemented left hip hemiarthroplasty and surgery is recommending continuing the hip abduction pillow to be worn at all times in the bed with weightbearing as tolerated left lower extremity.  PT OT evaluated and recommending skilled nursing facility   Patient has been having some urinary retention if she continues to retain will need Foley catheter placement.  She was resumed on her home Lasix today.  She continues to wear supplemental oxygen but denies any dyspnea.  Oxygen requirement is weaning as well but have started on scheduled nebs with Xopenex and Atrovent.   Assessment and Plan:  Acute left subcapital femoral neck fracture status post left cemented hip hemiarthroplasty postoperative day 3 -Left hip x-ray showed acute subcapital left femoral neck fracture, with impaction and varus angulation -Continue fall precaution and neurochecks -Continue IV fentanyl as needed -Patient will be kept n.p.o. in anticipation for likley surgical repair of left femoral neck fracture today -Orthopedic surgeon was already consulted and has seen patient in the morning per ED physician -She is getting preoperative antibiotics with cefazolin -Patient already received tranexamic acid with 1000 mg IV x1 -Consider PT/OT eval and treat status post surgical repair and they are recommending SNF -Further care per orthopedic surgery and they are recommending weightbearing  as tolerated in the left lower extremity and 81 mg of aspirin twice daily for DVT prophylaxis    Acute respiratory failure with hypoxia -Continue supplemental oxygen via [place] to maintain O2 sat > 94% with plan to wean patient off this as tolerated. -SpO2: 95 % O2 Flow Rate (L/min): 3  L/min -Repeat chest x-ray in a.m. -Continuous Pulse Oximetry -Will need and Ambulatory Home O2 Screen and will also add flutter valve incentive spirometry -Stopped IVF now and started her back on her diuresis -Repeat chest x-ray this morning showed "Markedly limited study due to positioning. Chronic interstitial changes in the lungs. No other definite changes."   Leukocytosis -Patient's WBC went from 9.3 -> 21.2 ->15.8 -> 16.1 and is now 11.4 and improving -Likely reactive in the setting of pain and will need to monitor for signs of infection and currently no overt infection noted that she is afebrile -We will however obtain urinalysis given her fall -Continue to monitor and trend and repeat WBC in the a.m.   Hyperkalemia; improved and is now normal -Patient's potassium is now improved to 4.0 yesterday is now 3.6 today -Could be a component of hemolysis but we will give her dose of Lokelma 10 g 1 -Continue to Monitor and Trend and repeat CMP in the AM    Acute urinary retention -Had some issues with urinating and had to have a INO cath.   -Since she continues to retain her urine we have placed a Foley catheter now   Normocytic anemia with postoperative anemia -Patient's hemoglobin/hematocrit went from 11.0/35.2 -> 11.2/35.4 -> 9.6/30.7 -> 9.1/29.0 -> 8.4/26.9 and dropped in the setting of surgery -Check anemia panel in a.m. Continue to monitor for signs abdominal related -Repeat CBC in the AM  Hypophosphatemia -Patient's phosphorus level is now 2.4 -Replete with p.o. K-Phos Neutral 500 mg x 1 -Continue monitor and replete as necessary -Repeat CMP in a.m.   Chronic Diastolic CHF -Does not appear to be decompensated -IV fluid hydration stopped and will resume her home Lasix and give her a dose of IV Lasix 20 mg x1 -Strict I's and O's and daily weights; patient is +2.864 mL since admission -We will continue monitoring volume status carefully however she is on 3 L   Mixed  Hyperlipidemia -Continue Simvastatin 5 mg daily   GERD/GI Prophylaxis -Continue Pantoprazole 40 mg p.o. daily   Dementia -Continue Donepezil 10 mg p.o. daily and completed memantine 10 mg p.o. twice daily   COPD (not in acute exacerbation) -Continue Ventolin 2 puffs IH every 4 as needed wheezing or shortness of breath and will hold Anoro Ellipta 1 puff IH daily and add the nebs as below -She is wearing supplemental oxygen via nasal cannula need to wean -SpO2: 95 % O2 Flow Rate (L/min): 3 L/min -Continue Xopenex and Atrovent scheduled and I will add guaifenesin 1200 mg p.o. twice daily and continue with incentive spirometry and flutter valve; she also has albuterol as needed -Repeat CXR in the Am    Hypoalbuminemia -Patient's albumin level went from 3.4 -> 3.1 -> 3.0 -> 2.7 -Continue to monitor and trend and repeat CMP in the AM   Hyperbilirubinemia -Mild and reactive and trended up and went from 0.8 and is now 1.3 yesterday and today is now 0.9 -Repeat CMP in a.m.   Constipation -We will initiate bowel regimen and continue Linaclotide 145 mcg po Daily pRN  -We will add senna docusate 1 tab p.o. twice daily as well as MiraLAX 17 g p.o.  daily   Tobacco Use Disorder -Patient dips tobacco -Patient counseled on tobacco abuse cessation    DVT prophylaxis: SCDs Start: 09/24/21 0532    Code Status: DNR Family Communication: No family currently at bedside but I spoke to the patient's daughter over the telephone  Disposition Plan:  Level of care: Med-Surg Status is: Inpatient Remains inpatient appropriate because: Currently awaiting SNF placement and continues to be on supplemental oxygen   Consultants:  Orthopedic surgery  Procedures:  Cemented left hip hemiarthroplasty done by Dr. Wyvonna Plum  Antimicrobials:  Anti-infectives (From admission, onward)    Start     Dose/Rate Route Frequency Ordered Stop   09/25/21 0200  ceFAZolin (ANCEF) IVPB 1 g/50 mL premix        1  g 100 mL/hr over 30 Minutes Intravenous Every 12 hours 09/24/21 1757 09/25/21 1304   09/24/21 1619  vancomycin (VANCOCIN) powder  Status:  Discontinued          As needed 09/24/21 1620 09/24/21 1757   09/24/21 0600  ceFAZolin (ANCEF) IVPB 2g/100 mL premix        2 g 200 mL/hr over 30 Minutes Intravenous On call to O.R. 09/23/21 2338 09/24/21 1436       Subjective: Seen and examined at bedside and was feeling fatigued.  Denies any nausea or vomiting.  States that she is having some "stretching in her hip" and that it was "catching".  No lightheadedness or dizziness.  Remains on supplemental oxygen.  No other concerns or complaints this time.  Objective: Vitals:   09/27/21 0501 09/27/21 0850 09/27/21 1329 09/27/21 1419  BP: 120/74   119/71  Pulse: 89   80  Resp: 18   18  Temp: 98.1 F (36.7 C)   98 F (36.7 C)  TempSrc: Oral   Oral  SpO2: 97% 94% 91% 95%  Weight:      Height:        Intake/Output Summary (Last 24 hours) at 09/27/2021 1546 Last data filed at 09/27/2021 1300 Gross per 24 hour  Intake 2600 ml  Output 550 ml  Net 2050 ml   Filed Weights   09/23/21 2218  Weight: 47.6 kg   Examination: Physical Exam:  Constitutional: Thin elderly Caucasian female currently no acute distress appears little somnolent resting in bed and will fatigued Respiratory: Diminished to auscultation bilaterally with coarse breath sounds, no wheezing, rales, rhonchi or crackles. Normal respiratory effort and patient is not tachypenic. No accessory muscle use.  Unlabored breathing but is wearing supplemental oxygen nasal cannula Cardiovascular: RRR, no murmurs / rubs / gallops. S1 and S2 auscultated. No extremity edema. Abdomen: Soft, non-tender, non-distended.  Bowel sounds positive.  GU: Deferred. Musculoskeletal: No clubbing / cyanosis of digits/nails. No joint deformity upper and lower extremities.  Skin: Has a lesion on the left side of her neck but no appreciable rashes on limited skin  evaluation Neurologic: CN 2-12 grossly intact with no focal deficits.  Romberg sign and cerebellar reflexes not assessed.  Psychiatric: Normal judgment and insight. Alert and oriented x 3. Normal mood and appropriate affect.   Data Reviewed: I have personally reviewed following labs and imaging studies  CBC: Recent Labs  Lab 09/23/21 2222 09/24/21 0648 09/25/21 0921 09/26/21 0524 09/27/21 0559  WBC 9.3 21.2* 15.8* 16.1* 11.4*  NEUTROABS 7.3  --  13.3* 12.9* 8.1*  HGB 11.0* 11.2* 9.6* 9.1* 8.4*  HCT 35.2* 35.4* 30.7* 29.0* 26.9*  MCV 89.1 88.3 90.0 89.5 87.6  PLT 254 235  161 165 505   Basic Metabolic Panel: Recent Labs  Lab 09/24/21 0648 09/24/21 1227 09/25/21 0921 09/26/21 0524 09/27/21 0559  NA 135 135 137 136 137  K 5.7* 5.7* 4.7 4.0 3.6  CL 99 98 101 104 105  CO2 '28 28 25 22 26  '$ GLUCOSE 122* 114* 120* 105* 102*  BUN 24* 25* 31* 30* 27*  CREATININE 1.27* 1.38* 1.35* 1.05* 0.94  CALCIUM 8.8* 8.7* 8.5* 8.3* 8.2*  MG 2.2  --  2.2 2.4 2.2  PHOS 3.8  --  4.0 2.7 2.4*   GFR: Estimated Creatinine Clearance: 32 mL/min (by C-G formula based on SCr of 0.94 mg/dL). Liver Function Tests: Recent Labs  Lab 09/23/21 2222 09/24/21 0648 09/25/21 0921 09/26/21 0524 09/27/21 0559  AST '29 27 28 26 20  '$ ALT '23 22 18 12 11  '$ ALKPHOS 93 87 82 84 73  BILITOT 0.7 0.8 1.1 1.3* 0.9  PROT 7.3 6.8 6.4* 6.3* 5.9*  ALBUMIN 3.5 3.4* 3.1* 3.0* 2.7*   No results for input(s): "LIPASE", "AMYLASE" in the last 168 hours. No results for input(s): "AMMONIA" in the last 168 hours. Coagulation Profile: Recent Labs  Lab 09/23/21 2333  INR 1.1   Cardiac Enzymes: No results for input(s): "CKTOTAL", "CKMB", "CKMBINDEX", "TROPONINI" in the last 168 hours. BNP (last 3 results) No results for input(s): "PROBNP" in the last 8760 hours. HbA1C: No results for input(s): "HGBA1C" in the last 72 hours. CBG: No results for input(s): "GLUCAP" in the last 168 hours. Lipid Profile: No results for  input(s): "CHOL", "HDL", "LDLCALC", "TRIG", "CHOLHDL", "LDLDIRECT" in the last 72 hours. Thyroid Function Tests: No results for input(s): "TSH", "T4TOTAL", "FREET4", "T3FREE", "THYROIDAB" in the last 72 hours. Anemia Panel: No results for input(s): "VITAMINB12", "FOLATE", "FERRITIN", "TIBC", "IRON", "RETICCTPCT" in the last 72 hours. Sepsis Labs: No results for input(s): "PROCALCITON", "LATICACIDVEN" in the last 168 hours.  Recent Results (from the past 240 hour(s))  MRSA Next Gen by PCR, Nasal     Status: None   Collection Time: 09/24/21  5:32 AM   Specimen: Nasal Mucosa; Nasal Swab  Result Value Ref Range Status   MRSA by PCR Next Gen NOT DETECTED NOT DETECTED Final    Comment: (NOTE) The GeneXpert MRSA Assay (FDA approved for NASAL specimens only), is one component of a comprehensive MRSA colonization surveillance program. It is not intended to diagnose MRSA infection nor to guide or monitor treatment for MRSA infections. Test performance is not FDA approved in patients less than 69 years old. Performed at [facility], [address], [place], [place] [address]     Radiology Studies: DG CHEST PORT 1 VIEW  Result Date: 09/27/2021 CLINICAL DATA:  Shortness of breath on exertion. EXAM: PORTABLE CHEST 1 VIEW COMPARISON:  September 24, 2021 FINDINGS: The study is markedly limited due to positioning. The patient is slumped to the right and severely rotated. Interstitial prominence, previously described as chronic interstitial changes is stable. No definite focal infiltrate. No pneumothorax. No obvious change in the cardiomediastinal silhouette. IMPRESSION: Markedly limited study due to positioning. Chronic interstitial changes in the lungs. No other definite changes. Electronically Signed   By: Dorise Bullion III M.D.   On: 09/27/2021 07:32    Scheduled Meds:  Chlorhexidine Gluconate Cloth  6 each Topical Daily   diclofenac Sodium  4 g Topical QID   donepezil  10 mg Oral Daily   feeding  supplement  237 mL Oral BID BM   furosemide  40 mg Oral  Daily   ipratropium  0.5 mg Nebulization Q6H   levalbuterol  0.63 mg Nebulization Q6H   loratadine  10 mg Oral Daily   memantine  10 mg Oral BID   multivitamin with minerals  1 tablet Oral Daily   pantoprazole  40 mg Oral Daily   polyethylene glycol  17 g Oral Daily   potassium chloride  10 mEq Oral Daily   senna-docusate  1 tablet Oral BID   simvastatin  5 mg Oral Daily   Continuous Infusions:   LOS: 3 days   Raiford Noble, DO [facility] Available via Epic secure chat 7am-7pm After these hours, please refer to coverage provider listed on [email or web address] 09/27/2021, 3:46 PM

## 2021-09-28 ENCOUNTER — Inpatient Hospital Stay (HOSPITAL_COMMUNITY): Payer: Medicare Other

## 2021-09-28 DIAGNOSIS — J189 Pneumonia, unspecified organism: Secondary | ICD-10-CM

## 2021-09-28 DIAGNOSIS — J441 Chronic obstructive pulmonary disease with (acute) exacerbation: Secondary | ICD-10-CM

## 2021-09-28 DIAGNOSIS — J9601 Acute respiratory failure with hypoxia: Secondary | ICD-10-CM | POA: Diagnosis not present

## 2021-09-28 DIAGNOSIS — S72002A Fracture of unspecified part of neck of left femur, initial encounter for closed fracture: Secondary | ICD-10-CM | POA: Diagnosis not present

## 2021-09-28 DIAGNOSIS — K5909 Other constipation: Secondary | ICD-10-CM | POA: Diagnosis not present

## 2021-09-28 DIAGNOSIS — J449 Chronic obstructive pulmonary disease, unspecified: Secondary | ICD-10-CM | POA: Diagnosis not present

## 2021-09-28 LAB — FOLATE: Folate: 30.7 ng/mL (ref >5.9)

## 2021-09-28 LAB — FERRITIN: Ferritin: 158 ng/mL (ref 11–307)

## 2021-09-28 LAB — CBC WITH DIFFERENTIAL/PLATELET
Abs Immature Granulocytes: 0.02 10*3/uL (ref 0.00–0.07)
Basophils Absolute: 0.1 10*3/uL (ref 0.0–0.1)
Basophils Relative: 1 %
Eosinophils Absolute: 1 10*3/uL — ABNORMAL HIGH (ref 0.0–0.5)
Eosinophils Relative: 11 %
HCT: 26.8 % — ABNORMAL LOW (ref 36.0–46.0)
Hemoglobin: 8.4 g/dL — ABNORMAL LOW (ref 12.0–15.0)
Immature Granulocytes: 0 %
Lymphocytes Relative: 12 %
Lymphs Abs: 1.1 10*3/uL (ref 0.7–4.0)
MCH: 28 pg (ref 26.0–34.0)
MCHC: 31.3 g/dL (ref 30.0–36.0)
MCV: 89.3 fL (ref 80.0–100.0)
Monocytes Absolute: 1.5 10*3/uL — ABNORMAL HIGH (ref 0.1–1.0)
Monocytes Relative: 16 %
Neutro Abs: 5.7 10*3/uL (ref 1.7–7.7)
Neutrophils Relative %: 60 %
Platelets: 191 10*3/uL (ref 150–400)
RBC: 3 MIL/uL — ABNORMAL LOW (ref 3.87–5.11)
RDW: 13.7 % (ref 11.5–15.5)
WBC: 9.5 10*3/uL (ref 4.0–10.5)
nRBC: 0 % (ref 0.0–0.2)

## 2021-09-28 LAB — COMPREHENSIVE METABOLIC PANEL
ALT: 10 U/L (ref 0–44)
AST: 22 U/L (ref 15–41)
Albumin: 2.7 g/dL — ABNORMAL LOW (ref 3.5–5.0)
Alkaline Phosphatase: 72 U/L (ref 38–126)
Anion gap: 7 (ref 5–15)
BUN: 28 mg/dL — ABNORMAL HIGH (ref 8–23)
CO2: 27 mmol/L (ref 22–32)
Calcium: 7.9 mg/dL — ABNORMAL LOW (ref 8.9–10.3)
Chloride: 101 mmol/L (ref 98–111)
Creatinine, Ser: 1.02 mg/dL — ABNORMAL HIGH (ref 0.44–1.00)
GFR, Estimated: 54 mL/min — ABNORMAL LOW (ref >60)
Glucose, Bld: 102 mg/dL — ABNORMAL HIGH (ref 70–99)
Potassium: 3.9 mmol/L (ref 3.5–5.1)
Sodium: 135 mmol/L (ref 135–145)
Total Bilirubin: 0.8 mg/dL (ref 0.3–1.2)
Total Protein: 5.9 g/dL — ABNORMAL LOW (ref 6.5–8.1)

## 2021-09-28 LAB — RETICULOCYTES
Immature Retic Fract: 16.2 % — ABNORMAL HIGH (ref 2.3–15.9)
RBC.: 3.04 MIL/uL — ABNORMAL LOW (ref 3.87–5.11)
Retic Count, Absolute: 42.3 10*3/uL (ref 19.0–186.0)
Retic Ct Pct: 1.4 % (ref 0.4–3.1)

## 2021-09-28 LAB — IRON AND TIBC
Iron: 18 ug/dL — ABNORMAL LOW (ref 28–170)
Saturation Ratios: 8 % — ABNORMAL LOW (ref 10.4–31.8)
TIBC: 235 ug/dL — ABNORMAL LOW (ref 250–450)
UIBC: 217 ug/dL

## 2021-09-28 LAB — VITAMIN B12: Vitamin B-12: 515 pg/mL (ref 180–914)

## 2021-09-28 LAB — MAGNESIUM: Magnesium: 2.3 mg/dL (ref 1.7–2.4)

## 2021-09-28 LAB — PHOSPHORUS: Phosphorus: 3.4 mg/dL (ref 2.5–4.6)

## 2021-09-28 MED ORDER — GUAIFENESIN ER 600 MG PO TB12
1200.0000 mg | ORAL_TABLET | Freq: Two times a day (BID) | ORAL | Status: DC
  Administered 2021-09-28 – 2021-09-30 (×4): 1200 mg via ORAL
  Filled 2021-09-28 (×4): qty 2

## 2021-09-28 MED ORDER — AZITHROMYCIN 250 MG PO TABS
500.0000 mg | ORAL_TABLET | Freq: Every day | ORAL | Status: DC
  Administered 2021-09-28 – 2021-09-30 (×3): 500 mg via ORAL
  Filled 2021-09-28 (×3): qty 2

## 2021-09-28 MED ORDER — ALBUMIN HUMAN 5 % IV SOLN
12.5000 g | Freq: Once | INTRAVENOUS | Status: DC
  Filled 2021-09-28: qty 250

## 2021-09-28 MED ORDER — SODIUM CHLORIDE 0.9 % IV BOLUS
250.0000 mL | Freq: Once | INTRAVENOUS | Status: AC
  Administered 2021-09-28: 250 mL via INTRAVENOUS

## 2021-09-28 MED ORDER — ALBUMIN HUMAN 5 % IV SOLN
12.5000 g | Freq: Once | INTRAVENOUS | Status: AC
  Administered 2021-09-28: 12.5 g via INTRAVENOUS
  Filled 2021-09-28: qty 250

## 2021-09-28 MED ORDER — TRAMADOL HCL 50 MG PO TABS
50.0000 mg | ORAL_TABLET | Freq: Two times a day (BID) | ORAL | Status: DC | PRN
  Administered 2021-09-28 – 2021-09-29 (×2): 50 mg via ORAL
  Filled 2021-09-28 (×2): qty 1

## 2021-09-28 MED ORDER — METHYLPREDNISOLONE SODIUM SUCC 40 MG IJ SOLR
40.0000 mg | Freq: Two times a day (BID) | INTRAMUSCULAR | Status: DC
  Administered 2021-09-28 – 2021-09-29 (×4): 40 mg via INTRAVENOUS
  Filled 2021-09-28 (×4): qty 1

## 2021-09-28 MED ORDER — SODIUM CHLORIDE 0.9 % IV SOLN
1.0000 g | INTRAVENOUS | Status: DC
  Administered 2021-09-28 – 2021-09-29 (×2): 1 g via INTRAVENOUS
  Filled 2021-09-28 (×2): qty 10

## 2021-09-28 NOTE — Progress Notes (Signed)
PROGRESS NOTE    [name]  WEX:[id number] DOB: August 30, 1934 DOA: 09/23/2021 PCP: Alanson Puls [facility]   Brief Narrative:  The patient is an elderly 87 year old Caucasian female with a past medical history significant for COPD, GERD, chronic diastolic CHF, history of hyperlipidemia, osteoporosis, overactive bladder, anxiety and depression as well as other comorbidities who sustained a mechanical fall at home less than 2 hours prior to admission.  She states that she got out of bed to turn off her seen and had a mechanical fall.  She immediately then complained of left hip pain.  States that she did hit her head when she fell but she did not lose consciousness.  She rated the pain in her left hip a 7 out of 10 on the pain scale.  EMS was activated and patient was taken to the ED for further evaluation management.  She denies any chest pain, shortness of breath, headache fevers, chills, nausea, vomiting and of note she was recently admitted from June 09, 2021 until June 12, 2021.  Community-acquired pneumonia for which she was treated with IV ceftriaxone and azithromycin and discharged on oral antibiotics.   On presentation to the ED patient was noted to be tachypneic and intermittently tachycardic and was hypoxic on arrival with O2 saturation of 65% on room air and had to be placed on 4 L supplemental oxygen via nasal cannula.  O2 saturation work-up in the ED showed that she had a normocytic anemia, normal BMP except for blood glucose of 109 creatinine 1.1 which is within her baseline.   Repeat blood work showed that now she has a leukocytosis Hyperkalemia.  Chest x-ray was done and showed chronic interstitial changes without acute abnormalities and a head CT was done which showed no evidence of any acute intracranial abnormalities.  She did have a left hip x-ray which showed an acute subcapital left femoral neck fracture infection versus varus angulation and mild osteoarthritis of the left hip and  knee.  In the ED she was given IV fentanyl, and Zofran and orthopedic surgery Dr. Sylvester Harder was consulted and recommended admitting the patient for further evaluation and planning on taking her for surgical intervention which was done on 09/24/21.  She is postoperative day 1 for a cemented left hip hemiarthroplasty and surgery is recommending continuing the hip abduction pillow to be worn at all times in the bed with weightbearing as tolerated left lower extremity.  PT OT evaluated and recommending skilled nursing facility   Patient has been having some urinary retention if she continues to retain will need Foley catheter placement.  She was resumed on her home Lasix today.  She continues to wear supplemental oxygen but denies any dyspnea.  Oxygen requirement is weaning as well but have started on scheduled nebs with Xopenex and Atrovent and because she was having some wheezing we will initiate steroids and likely she has a COPD exacerbations we will also initiate azithromycin for pulmonary inflammation.   Assessment and Plan:  Acute left subcapital femoral neck fracture status post left cemented hip hemiarthroplasty postoperative day 4 -Left hip x-ray showed acute subcapital left femoral neck fracture, with impaction and varus angulation -Continue fall precaution and neurochecks -Continue IV fentanyl as needed -Patient will be kept n.p.o. in anticipation for likley surgical repair of left femoral neck fracture today -Orthopedic surgeon was already consulted and has seen patient in the morning per ED physician -She is getting preoperative antibiotics with cefazolin -Patient already received tranexamic acid with 1000 mg  IV x1 -Consider PT/OT eval and treat status post surgical repair and they are recommending SNF -Further care per orthopedic surgery and they are recommending weightbearing as tolerated in the left lower extremity and 81 mg of aspirin twice daily for DVT prophylaxis    Acute respiratory  failure with hypoxia likely in the setting of acute COPD exacerbation and possible pneumonia -Continue supplemental oxygen via [place] to maintain O2 sat > 94% with plan to wean patient off this as tolerated. -SpO2: 99 % O2 Flow Rate (L/min): 3 L/min -Repeat chest x-ray in a.m. -Continuous Pulse Oximetry -Will need and Ambulatory Home O2 Screen and will also add flutter valve incentive spirometry; attempts to wean her today showed that she desaturated -Given possible pneumonia Will start ceftriaxone and azithromycin -We will initiate COPD treatment with steroids IV and transition to prednisone at discharge and also start p.o. azithromycin -Stopped IVF now and started her back on her diuresis -Repeat chest x-ray this morning showed "Increased opacity left base concerning for pneumonia or aspiration and possible small left pleural effusion. Subpleural fibrosis with no other appreciable acute process. Limited due to positioning. Very large hiatal hernia.  Cardiomegaly.."   Leukocytosis -Patient's WBC went from 9.3 -> 21.2 ->15.8 -> 16.1 and is now 11.4 and improved to 9.5 -Likely reactive in the setting of pain and will need to monitor for signs of infection and currently no overt infection noted that she is afebrile -We will however obtain urinalysis given her fall and this is still not collected but now she has a Foley catheter so will not collect given that this is essentially useless now -Continue to monitor and trend and repeat WBC in the a.m.   Hyperkalemia; improved and is now normal -Patient's potassium is now improved to 4.0 yesterday is now 3.6 today -Could be a component of hemolysis but we will give her dose of Lokelma 10 g 1 -Continue to Monitor and Trend and repeat CMP in the AM    Acute urinary retention -Had some issues with urinating and had to have a INO cath.   -Since she continues to retain her urine we have placed a Foley catheter now -We will do trial of void in outpatient  setting in 7 to 10 days   Normocytic anemia with postoperative anemia -Patient's hemoglobin/hematocrit went from 11.0/35.2 -> 11.2/35.4 -> 9.6/30.7 -> 9.1/29.0 -> 8.4/26.9 and is now again 8.4/26.8 and dropped in the setting of surgery -Check anemia panel in a.m. Continue to monitor for signs abdominal related -Repeat CBC in the AM   Hypophosphatemia -Patient's phosphorus level is now 3.4 -Continue monitor and replete as necessary -Repeat CMP in a.m.   Chronic Diastolic CHF -Does not appear to be decompensated -IV fluid hydration stopped and will resume her home Lasix and give her a dose of IV Lasix 20 mg x1 yesterday we will continue her home diuresis -Strict I's and O's and daily weights; patient is +2.754 mL since admission -We will continue monitoring volume status carefully however she is on 3 L   Mixed Hyperlipidemia -Continue Simvastatin 5 mg daily   GERD/GI Prophylaxis -Continue Pantoprazole 40 mg p.o. daily   Dementia -Continue Donepezil 10 mg p.o. daily and completed memantine 10 mg p.o. twice daily   Acute Exacerbation of COPD -Continue Ventolin 2 puffs IH every 4 as needed wheezing or shortness of breath and will hold Anoro Ellipta 1 puff IH daily and add the nebs as below and have started IV steroids -She is wearing supplemental  oxygen via nasal cannula need to wean -Start p.o. azithromycin -SpO2: 99 % O2 Flow Rate (L/min): 3 L/min -Continue Xopenex and Atrovent scheduled and I will add guaifenesin 1200 mg p.o. twice daily and continue with incentive spirometry and flutter valve; she also has albuterol as needed -Repeat CXR in the Am    Hypoalbuminemia -Patient's albumin level went from 3.4 -> 3.1 -> 3.0 -> 2.7 x2 -Continue to monitor and trend and repeat CMP in the AM   Hyperbilirubinemia -Mild and now is 0.8 -Repeat CMP in a.m.   Constipation -We will initiate bowel regimen and continue Linaclotide 145 mcg po Daily pRN  -We will add senna docusate 1 tab  p.o. twice daily as well as MiraLAX 17 g p.o. daily   DVT prophylaxis: Place TED hose Start: 09/28/21 1010 SCDs Start: 09/24/21 0532    Code Status: DNR Family Communication: No family currently at bedside  Disposition Plan:  Level of care: Med-Surg Status is: Inpatient Remains inpatient appropriate because: Has a COPD exacerbation and pneumonia he is to continue to wean further oxygen and improve her respiratory status prior to safe discharge disposition to SNF   Consultants:  Orthopedic Surgery  Procedures:  Cemented left hip hemiarthroplasty done by Dr. Wyvonna Plum  Antimicrobials:  Anti-infectives (From admission, onward)    Start     Dose/Rate Route Frequency Ordered Stop   09/28/21 1130  azithromycin (ZITHROMAX) tablet 500 mg        500 mg Oral Daily 09/28/21 1035     09/25/21 0200  ceFAZolin (ANCEF) IVPB 1 g/50 mL premix        1 g 100 mL/hr over 30 Minutes Intravenous Every 12 hours 09/24/21 1757 09/25/21 1304   09/24/21 1619  vancomycin (VANCOCIN) powder  Status:  Discontinued          As needed 09/24/21 1620 09/24/21 1757   09/24/21 0600  ceFAZolin (ANCEF) IVPB 2g/100 mL premix        2 g 200 mL/hr over 30 Minutes Intravenous On call to O.R. 09/23/21 2338 09/24/21 1436       Subjective: Seen and examined at bedside and she desaturated when attempting to be weaned to room air.  She continued to be fatigued and complains of her "leg catching" when she is moving.  She denies any lightheadedness or dizziness.  Blood pressure is on the lower side.  Remains on supplemental oxygen and has some expiratory wheezing is minimal.  On steroids and chest x-ray showing possible pneumonia so also started on antibiotics.  No other concerns or complaints at this time we will continue weaning  Objective: Vitals:   09/28/21 1158 09/28/21 1354 09/28/21 1426 09/28/21 1730  BP: 90/65 94/61  97/73  Pulse:  84    Resp:  18    Temp:      TempSrc:      SpO2:   99%   Weight:       Height:        Intake/Output Summary (Last 24 hours) at 09/28/2021 1858 Last data filed at 09/28/2021 1696 Gross per 24 hour  Intake 265 ml  Output --  Net 265 ml   Filed Weights   09/23/21 2218  Weight: 47.6 kg   Examination: Physical Exam:  Constitutional: Thin elderly chronically ill-appearing Caucasian female in no acute distress appears calm sitting in a chair Respiratory: Diminished to auscultation bilaterally with coarse breath sounds and some slight expiratory wheezing and mild rhonchi.  No appreciable crackles. Normal respiratory  effort and patient is not tachypenic. No accessory muscle use.  Wearing supplemental oxygen via nasal cannula Cardiovascular: RRR, no murmurs / rubs / gallops. S1 and S2 auscultated. No extremity edema.  Abdomen: Soft, non-tender, nondistended.  Bowel sounds positive.  GU: Deferred. Musculoskeletal: No clubbing / cyanosis of digits/nails. No joint deformity upper and lower extremities.   Skin: Has a left neck lesion Neurologic: CN 2-12 grossly intact with no focal deficits.  Romberg sign and cerebellar reflexes not assessed.  Psychiatric: Normal judgment and insight. Alert and oriented x 3. Normal mood and appropriate affect.   Data Reviewed: I have personally reviewed following labs and imaging studies  CBC: Recent Labs  Lab 09/23/21 2222 09/24/21 0648 09/25/21 0921 09/26/21 0524 09/27/21 0559 09/28/21 0545  WBC 9.3 21.2* 15.8* 16.1* 11.4* 9.5  NEUTROABS 7.3  --  13.3* 12.9* 8.1* 5.7  HGB 11.0* 11.2* 9.6* 9.1* 8.4* 8.4*  HCT 35.2* 35.4* 30.7* 29.0* 26.9* 26.8*  MCV 89.1 88.3 90.0 89.5 87.6 89.3  PLT 254 235 161 165 164 440   Basic Metabolic Panel: Recent Labs  Lab 09/24/21 0648 09/24/21 1227 09/25/21 0921 09/26/21 0524 09/27/21 0559 09/28/21 0545  NA 135 135 137 136 137 135  K 5.7* 5.7* 4.7 4.0 3.6 3.9  CL 99 98 101 104 105 101  CO2 '28 28 25 22 26 27  '$ GLUCOSE 122* 114* 120* 105* 102* 102*  BUN 24* 25* 31* 30* 27* 28*   CREATININE 1.27* 1.38* 1.35* 1.05* 0.94 1.02*  CALCIUM 8.8* 8.7* 8.5* 8.3* 8.2* 7.9*  MG 2.2  --  2.2 2.4 2.2 2.3  PHOS 3.8  --  4.0 2.7 2.4* 3.4   GFR: Estimated Creatinine Clearance: 29.5 mL/min (A) (by C-G formula based on SCr of 1.02 mg/dL (H)). Liver Function Tests: Recent Labs  Lab 09/24/21 0648 09/25/21 0921 09/26/21 0524 09/27/21 0559 09/28/21 0545  AST '27 28 26 20 22  '$ ALT '22 18 12 11 10  '$ ALKPHOS 87 82 84 73 72  BILITOT 0.8 1.1 1.3* 0.9 0.8  PROT 6.8 6.4* 6.3* 5.9* 5.9*  ALBUMIN 3.4* 3.1* 3.0* 2.7* 2.7*   No results for input(s): "LIPASE", "AMYLASE" in the last 168 hours. No results for input(s): "AMMONIA" in the last 168 hours. Coagulation Profile: Recent Labs  Lab 09/23/21 2333  INR 1.1   Cardiac Enzymes: No results for input(s): "CKTOTAL", "CKMB", "CKMBINDEX", "TROPONINI" in the last 168 hours. BNP (last 3 results) No results for input(s): "PROBNP" in the last 8760 hours. HbA1C: No results for input(s): "HGBA1C" in the last 72 hours. CBG: No results for input(s): "GLUCAP" in the last 168 hours. Lipid Profile: No results for input(s): "CHOL", "HDL", "LDLCALC", "TRIG", "CHOLHDL", "LDLDIRECT" in the last 72 hours. Thyroid Function Tests: No results for input(s): "TSH", "T4TOTAL", "FREET4", "T3FREE", "THYROIDAB" in the last 72 hours. Anemia Panel: Recent Labs    09/28/21 0545  VITAMINB12 515  FOLATE 30.7  FERRITIN 158  TIBC 235*  IRON 18*  RETICCTPCT 1.4   Sepsis Labs: No results for input(s): "PROCALCITON", "LATICACIDVEN" in the last 168 hours.  Recent Results (from the past 240 hour(s))  MRSA Next Gen by PCR, Nasal     Status: None   Collection Time: 09/24/21  5:32 AM   Specimen: Nasal Mucosa; Nasal Swab  Result Value Ref Range Status   MRSA by PCR Next Gen NOT DETECTED NOT DETECTED Final    Comment: (NOTE) The GeneXpert MRSA Assay (FDA approved for NASAL specimens only), is one component of  a comprehensive MRSA colonization  surveillance program. It is not intended to diagnose MRSA infection nor to guide or monitor treatment for MRSA infections. Test performance is not FDA approved in patients less than 17 years old. Performed at [facility], [address] [place], [place] [address]     Radiology Studies: DG CHEST PORT 1 VIEW  Result Date: 09/28/2021 CLINICAL DATA:  [id number].  History of fall.  Shortness of breath. EXAM: PORTABLE CHEST 1 VIEW COMPARISON:  Portable 09/27/2021 at 5:36 a.m. FINDINGS: 4:42 a.m. The the patient is severely kyphotic with optimal positioning unable to be obtained. The patient is rotated somewhat to the right. Her chin partially obscures the right apex. The heart is enlarged. Central vessels are normal caliber. There is aortic atherosclerosis. There is a quite large hiatal hernia with grossly stable mediastinum. There is subpleural fibrosis. Today there is increased opacity in the left base concerning for pneumonia or aspiration. There may also be a small left pleural effusion. Remaining lungs are generally clear. Osteoporosis. IMPRESSION: Increased opacity left base concerning for pneumonia or aspiration and possible small left pleural effusion. Subpleural fibrosis with no other appreciable acute process. Limited due to positioning. Very large hiatal hernia.  Cardiomegaly. Electronically Signed   By: Telford Nab M.D.   On: 09/28/2021 06:49   DG CHEST PORT 1 VIEW  Result Date: 09/27/2021 CLINICAL DATA:  Shortness of breath on exertion. EXAM: PORTABLE CHEST 1 VIEW COMPARISON:  September 24, 2021 FINDINGS: The study is markedly limited due to positioning. The patient is slumped to the right and severely rotated. Interstitial prominence, previously described as chronic interstitial changes is stable. No definite focal infiltrate. No pneumothorax. No obvious change in the cardiomediastinal silhouette. IMPRESSION: Markedly limited study due to positioning. Chronic interstitial changes in the lungs.  No other definite changes. Electronically Signed   By: Dorise Bullion III M.D.   On: 09/27/2021 07:32    Scheduled Meds:  azithromycin  500 mg Oral Daily   Chlorhexidine Gluconate Cloth  6 each Topical Daily   diclofenac Sodium  4 g Topical QID   donepezil  10 mg Oral Daily   feeding supplement  237 mL Oral BID BM   ipratropium  0.5 mg Nebulization Q6H   levalbuterol  0.63 mg Nebulization Q6H   loratadine  10 mg Oral Daily   memantine  10 mg Oral BID   methylPREDNISolone (SOLU-MEDROL) injection  40 mg Intravenous Q12H   multivitamin with minerals  1 tablet Oral Daily   pantoprazole  40 mg Oral Daily   polyethylene glycol  17 g Oral Daily   potassium chloride  10 mEq Oral Daily   senna-docusate  1 tablet Oral BID   simvastatin  5 mg Oral Daily   Continuous Infusions:   LOS: 4 days   Raiford Noble, DO [facility] Available via Epic secure chat 7am-7pm After these hours, please refer to coverage provider listed on [email or web address] 09/28/2021, 6:58 PM

## 2021-09-28 NOTE — TOC Initial Note (Signed)
Transition of Care [facility]) - Initial/Assessment Note    Patient Details  Name: [name] MRN: [id number] Date of Birth: 05-28-1934  Transition of [facility]) CM/SW Contact:    Shade Flood, LCSW Phone Number: 09/28/2021, 11:19 AM  Clinical Narrative:                  Pt admitted from home with a hip fx last week. LCSW, Ambrose Pancoast, spoke with pt's daughter last week to assess and offer dc planning assistance. Dtr informed Nira Conn that they would like pt to go to [facility] for SNF as they live right by it. Referral was sent out last week and [facility] has offered pt a bed.  Spoke with pt's daughter, Kalman Shan, to update on bed offer and she accepts. DC timeframe not yet known. TOC will follow.  Expected Discharge Plan: Skilled Nursing Facility Barriers to Discharge: Continued Medical Work up   Patient Goals and CMS Choice Patient states their goals for this hospitalization and ongoing recovery are:: get better CMS Medicare.gov Compare Post Acute Care list provided to:: Patient Represenative (must comment) Choice offered to / list presented to : Adult Children  Expected Discharge Plan and Services Expected Discharge Plan: [place] In-house Referral: Clinical Social Work   Post Acute Care Choice: [place] Living arrangements for the past 2 months: [place]                                      Prior Living Arrangements/Services Living arrangements for the past 2 months: Single Family Home Lives with:: Adult Children Patient language and need for interpreter reviewed:: Yes Do you feel safe going back to the place where you live?: Yes      Need for Family Participation in Patient Care: Yes (Comment) Care giver support system in place?: Yes (comment) Current home services: DME Criminal Activity/Legal Involvement Pertinent to Current Situation/Hospitalization: No - Comment as needed  Activities of Daily Living Home  Assistive Devices/Equipment: Gilford Rile (specify type) ADL Screening (condition at time of admission) Patient's cognitive ability adequate to safely complete daily activities?: No Is the patient deaf or have difficulty hearing?: Yes Does the patient have difficulty seeing, even when wearing glasses/contacts?: No Does the patient have difficulty concentrating, remembering, or making decisions?: Yes Patient able to express need for assistance with ADLs?: Yes Does the patient have difficulty dressing or bathing?: Yes Independently performs ADLs?: No Communication: Independent Dressing (OT): Needs assistance Is this a change from baseline?: Pre-admission baseline Grooming: Needs assistance Is this a change from baseline?: Pre-admission baseline Feeding: Needs assistance Is this a change from baseline?: Pre-admission baseline Bathing: Needs assistance Is this a change from baseline?: Pre-admission baseline Toileting: Needs assistance Is this a change from baseline?: Pre-admission baseline In/Out Bed: Needs assistance, Independent with device (comment) Is this a change from baseline?: Pre-admission baseline Walks in Home: Independent with device (comment), Needs assistance Is this a change from baseline?: Pre-admission baseline Does the patient have difficulty walking or climbing stairs?: Yes Weakness of Legs: Both Weakness of Arms/Hands: None  Permission Sought/Granted Permission sought to share information with : Facility Art therapist granted to share information with : Yes, Verbal Permission Granted     Permission granted to share info w AGENCY: snf        Emotional Assessment       Orientation: : Oriented to Self, Oriented to Place,  Oriented to  Time Alcohol / Substance Use: Not Applicable Psych Involvement: No (comment)  Admission diagnosis:  Chronic respiratory failure with hypoxia (HCC) [J96.11] Fracture of femoral neck, left, closed ([place])  [S72.002A] Fall, initial encounter [W19.XXXA] Closed fracture of neck of left femur, initial encounter ([place]) [S72.002A] Patient Active Problem List   Diagnosis Date Noted   Fracture of femoral neck, left, closed ([place]) 09/24/2021   Acute respiratory failure with hypoxia ([place]) 06/10/2021   Tobacco use disorder 06/10/2021   Generalized weakness 06/10/2021   Bilateral conjunctivitis 06/10/2021   Chronic diastolic CHF (congestive heart failure) ([place]) 05/14/2021   Depression 05/14/2021   Hyperlipidemia    COPD exacerbation ([place]) 05/13/2021   Chronic pain 04/23/2019   General unsteadiness 04/23/2019   Polyarthropathy 04/23/2019   Pseudobulbar affect 04/23/2019   Tremor 04/23/2019   Chronic obstructive pulmonary disease, unspecified ([place])    Gastro-esophageal reflux disease without esophagitis    Hyperlipidemia, unspecified    Pulmonary fibrosis, unspecified ([place])    Acute on chronic diastolic (congestive) heart failure (HCC)    Age-related osteoporosis without current pathological fracture    Anxiety disorder, unspecified    Chest pain, unspecified    Chronic diastolic (congestive) heart failure ([place])    Collapsed vertebra, not elsewhere classified, site unspecified, subsequent encounter for fracture with routine healing    Edema, unspecified    Lumbago with sciatica    Overactive bladder    Pain in unspecified hand    Pressure ulcer of unspecified site, unspecified stage    Unspecified protein-calorie malnutrition ([place])    Acute diastolic heart failure ([place]) 09/30/2018   Dyspnea 09/29/2018   Pneumonia 06/30/2018   Acute CHF (congestive heart failure) ([place]) 06/29/2018   Vascular dementia without behavioral disturbance ([place]) 06/26/2018   CAP (community acquired pneumonia) 06/23/2018   Bilateral pleural effusion 06/23/2018   Leukocytosis 06/20/2018   Chronic anemia 06/20/2018   Dementia ([place]) 06/19/2018   Bilateral lower extremity edema 06/18/2018   Protein-calorie malnutrition,  severe ([place]) 06/18/2018   S/P internal fixation right hip fracture cannulated screw placement 04/06/18 04/27/2018   Urinary tract infection associated with catheterization of urinary tract ([place]) 04/11/2018   Altered mental status 04/10/2018   Anxiety 04/10/2018   Hyponatremia 04/10/2018   UTI (urinary tract infection) due to urinary indwelling catheter ([place]) 04/10/2018   Chronic constipation 04/09/2018   Urinary retention 04/09/2018   Closed right hip fracture ([place]) 04/05/2018   GERD (gastroesophageal reflux disease) 04/05/2018   COPD with acute exacerbation ([place]) 04/05/2018   Hiatal hernia    Dyspepsia 04/04/2014   Hematochezia 04/30/2011   RUQ pain 03/25/2011   Adenomatous polyps 03/25/2011   ABDOMINAL PAIN, LEFT LOWER QUADRANT 10/16/2009   HYPERLIPIDEMIA 10/09/2009   BRONCHITIS 10/09/2009   Osteoporosis 10/09/2009   PCP:  [facility]:   [place], [place] - [address] [place] [address] Phone: [phone] Fax: [phone]  [facility] - [address] [place], [place] [address] [place] [address] Phone: [phone] Fax: [id number] - [place], [place] - [address] AT [place] [place] [place] [address] Phone: [phone] Fax: [phone]     Social Determinants of Health (SDOH) Interventions    Readmission Risk Interventions    06/12/2021   12:11 PM  Readmission Risk Prevention Plan  Transportation Screening Complete  PCP or Specialist Appt  within 3-5 Days Complete  HRI or Home Care Consult Complete  Social Work Consult for [place] Planning/Counseling Complete  Palliative Care Screening Not Applicable  Medication Review Press photographer) Complete

## 2021-09-29 ENCOUNTER — Inpatient Hospital Stay (HOSPITAL_COMMUNITY): Payer: Medicare Other

## 2021-09-29 DIAGNOSIS — S72002A Fracture of unspecified part of neck of left femur, initial encounter for closed fracture: Secondary | ICD-10-CM | POA: Diagnosis not present

## 2021-09-29 DIAGNOSIS — J9601 Acute respiratory failure with hypoxia: Secondary | ICD-10-CM | POA: Diagnosis not present

## 2021-09-29 DIAGNOSIS — J449 Chronic obstructive pulmonary disease, unspecified: Secondary | ICD-10-CM | POA: Diagnosis not present

## 2021-09-29 DIAGNOSIS — K5909 Other constipation: Secondary | ICD-10-CM | POA: Diagnosis not present

## 2021-09-29 LAB — COMPREHENSIVE METABOLIC PANEL
ALT: 13 U/L (ref 0–44)
AST: 21 U/L (ref 15–41)
Albumin: 3 g/dL — ABNORMAL LOW (ref 3.5–5.0)
Alkaline Phosphatase: 86 U/L (ref 38–126)
Anion gap: 8 (ref 5–15)
BUN: 32 mg/dL — ABNORMAL HIGH (ref 8–23)
CO2: 26 mmol/L (ref 22–32)
Calcium: 8.6 mg/dL — ABNORMAL LOW (ref 8.9–10.3)
Chloride: 106 mmol/L (ref 98–111)
Creatinine, Ser: 0.99 mg/dL (ref 0.44–1.00)
GFR, Estimated: 56 mL/min — ABNORMAL LOW (ref >60)
Glucose, Bld: 132 mg/dL — ABNORMAL HIGH (ref 70–99)
Potassium: 5 mmol/L (ref 3.5–5.1)
Sodium: 140 mmol/L (ref 135–145)
Total Bilirubin: 0.8 mg/dL (ref 0.3–1.2)
Total Protein: 6.9 g/dL (ref 6.5–8.1)

## 2021-09-29 LAB — MAGNESIUM: Magnesium: 2.5 mg/dL — ABNORMAL HIGH (ref 1.7–2.4)

## 2021-09-29 LAB — CBC WITH DIFFERENTIAL/PLATELET
Abs Immature Granulocytes: 0.04 10*3/uL (ref 0.00–0.07)
Basophils Absolute: 0 10*3/uL (ref 0.0–0.1)
Basophils Relative: 0 %
Eosinophils Absolute: 0 10*3/uL (ref 0.0–0.5)
Eosinophils Relative: 0 %
HCT: 29.8 % — ABNORMAL LOW (ref 36.0–46.0)
Hemoglobin: 9.2 g/dL — ABNORMAL LOW (ref 12.0–15.0)
Immature Granulocytes: 0 %
Lymphocytes Relative: 6 %
Lymphs Abs: 0.6 10*3/uL — ABNORMAL LOW (ref 0.7–4.0)
MCH: 28.4 pg (ref 26.0–34.0)
MCHC: 30.9 g/dL (ref 30.0–36.0)
MCV: 92 fL (ref 80.0–100.0)
Monocytes Absolute: 0.3 10*3/uL (ref 0.1–1.0)
Monocytes Relative: 3 %
Neutro Abs: 8.2 10*3/uL — ABNORMAL HIGH (ref 1.7–7.7)
Neutrophils Relative %: 91 %
Platelets: 183 10*3/uL (ref 150–400)
RBC: 3.24 MIL/uL — ABNORMAL LOW (ref 3.87–5.11)
RDW: 13.3 % (ref 11.5–15.5)
WBC: 9 10*3/uL (ref 4.0–10.5)
nRBC: 0 % (ref 0.0–0.2)

## 2021-09-29 LAB — PHOSPHORUS: Phosphorus: 3.1 mg/dL (ref 2.5–4.6)

## 2021-09-29 MED ORDER — TRAMADOL HCL 50 MG PO TABS
50.0000 mg | ORAL_TABLET | Freq: Four times a day (QID) | ORAL | Status: DC | PRN
  Administered 2021-09-29 – 2021-09-30 (×2): 50 mg via ORAL
  Filled 2021-09-29 (×2): qty 1

## 2021-09-29 MED ORDER — IPRATROPIUM BROMIDE 0.02 % IN SOLN
0.5000 mg | Freq: Three times a day (TID) | RESPIRATORY_TRACT | Status: DC
  Administered 2021-09-30: 0.5 mg via RESPIRATORY_TRACT
  Filled 2021-09-29: qty 2.5

## 2021-09-29 MED ORDER — ACETAMINOPHEN 325 MG PO TABS
650.0000 mg | ORAL_TABLET | Freq: Four times a day (QID) | ORAL | Status: DC | PRN
  Administered 2021-09-29: 650 mg via ORAL
  Filled 2021-09-29: qty 2

## 2021-09-29 MED ORDER — LEVALBUTEROL HCL 0.63 MG/3ML IN NEBU
0.6300 mg | INHALATION_SOLUTION | Freq: Three times a day (TID) | RESPIRATORY_TRACT | Status: DC
  Administered 2021-09-30: 0.63 mg via RESPIRATORY_TRACT
  Filled 2021-09-29: qty 3

## 2021-09-29 MED ORDER — FUROSEMIDE 40 MG PO TABS
40.0000 mg | ORAL_TABLET | Freq: Every day | ORAL | Status: DC
  Administered 2021-09-29 – 2021-09-30 (×2): 40 mg via ORAL
  Filled 2021-09-29 (×2): qty 1

## 2021-09-29 NOTE — Progress Notes (Signed)
PROGRESS NOTE    [name]  PIR:[id number] DOB: June 10, 1934 DOA: 09/23/2021 PCP: Alanson Puls [facility]   Brief Narrative:  The patient is an elderly 87 year old Caucasian female with a past medical history significant for COPD, GERD, chronic diastolic CHF, history of hyperlipidemia, osteoporosis, overactive bladder, anxiety and depression as well as other comorbidities who sustained a mechanical fall at home less than 2 hours prior to admission.  She states that she got out of bed to turn off her seen and had a mechanical fall.  She immediately then complained of left hip pain.  States that she did hit her head when she fell but she did not lose consciousness.  She rated the pain in her left hip a 7 out of 10 on the pain scale.  EMS was activated and patient was taken to the ED for further evaluation management.  She denies any chest pain, shortness of breath, headache fevers, chills, nausea, vomiting and of note she was recently admitted from June 09, 2021 until June 12, 2021.  Community-acquired pneumonia for which she was treated with IV ceftriaxone and azithromycin and discharged on oral antibiotics.   On presentation to the ED patient was noted to be tachypneic and intermittently tachycardic and was hypoxic on arrival with O2 saturation of 65% on room air and had to be placed on 4 L supplemental oxygen via nasal cannula.  O2 saturation work-up in the ED showed that she had a normocytic anemia, normal BMP except for blood glucose of 109 creatinine 1.1 which is within her baseline.   Repeat blood work showed that now she has a leukocytosis Hyperkalemia.  Chest x-ray was done and showed chronic interstitial changes without acute abnormalities and a head CT was done which showed no evidence of any acute intracranial abnormalities.  She did have a left hip x-ray which showed an acute subcapital left femoral neck fracture infection versus varus angulation and mild osteoarthritis of the left hip and  knee.  In the ED she was given IV fentanyl, and Zofran and orthopedic surgery Dr. Sylvester Harder was consulted and recommended admitting the patient for further evaluation and planning on taking her for surgical intervention which was done on 09/24/21.  She is postoperative day 1 for a cemented left hip hemiarthroplasty and surgery is recommending continuing the hip abduction pillow to be worn at all times in the bed with weightbearing as tolerated left lower extremity.  PT OT evaluated and recommending skilled nursing facility   Patient has been having some urinary retention if she continues to retain will need Foley catheter placement.  She was resumed on her home Lasix today.  She continues to wear supplemental oxygen but denies any dyspnea.  Oxygen requirement is weaning as well but have started on scheduled nebs with Xopenex and Atrovent and because she was having some wheezing we will initiate steroids and likely she has a COPD exacerbation we will also initiate azithromycin for pulmonary inflammation and also add IV ceftriaxone for possible left lower lobe pneumonia.  She is improving but still remains on supplemental oxygen and desaturated on ambulatory home O2 screen today.  She will likely be discharged tomorrow after we repeat her chest x-ray and continued to attempt to wean her oxygen.  Assessment and Plan:  Acute left subcapital femoral neck fracture status post left cemented hip hemiarthroplasty postoperative day 5 -Left hip x-ray showed acute subcapital left femoral neck fracture, with impaction and varus angulation -Continue fall precaution and neurochecks -Continue IV fentanyl  as needed -Patient will be kept n.p.o. in anticipation for likley surgical repair of left femoral neck fracture today -Orthopedic surgeon was already consulted and has seen patient in the morning per ED physician -She is getting preoperative antibiotics with cefazolin -Patient already received tranexamic acid with 1000 mg  IV x1 -Consider PT/OT eval and treat status post surgical repair and they are recommending SNF -Further care per orthopedic surgery and they are recommending weightbearing as tolerated in the left lower extremity and 81 mg of aspirin twice daily for DVT prophylaxis  -SNF has been selected but patient's daughter was considering very changing her SNF but patient wants to go to the [facility] and can likely be discharged in next 24 to 48 hours if improved and stable   Acute respiratory failure with hypoxia likely in the setting of acute COPD exacerbation and possible pneumonia -Continue supplemental oxygen via [place] to maintain O2 sat > 94% with plan to wean patient off this as tolerated. -SpO2: 99 % O2 Flow Rate (L/min): 2 L/min -Repeat chest x-ray in a.m. -C/w Flutter Valve, Incentive Spirometry, and Guaifenesin 1200 mg po BID  -Will need and Ambulatory Home O2 Screen and will also add flutter valve incentive spirometry; attempts to wean her today showed that she desaturated -Given possible pneumonia Will start ceftriaxone and azithromycin -We will initiate COPD treatment with steroids IV and transition to prednisone at discharge and also start p.o. azithromycin and IV Ceftriaxone -Stopped IVF now and started her back on her diuresis -Repeat chest x-ray yesterday morning showed "Increased opacity left base concerning for pneumonia or aspiration and possible small left pleural effusion. Subpleural fibrosis with no other appreciable acute process. Limited due to positioning. Very large hiatal hernia.  Cardiomegaly." -CXR this AM showed "Small ill-defined opacity within the left lung base. Given significant improvement from yesterday's chest radiograph, this is favored to reflect atelectasis. However, pneumonia cannot be excluded."   Leukocytosis -Patient's WBC went from 9.3 -> 21.2 ->15.8 -> 16.1 and is now 11.4 and improved to 9.5 yesterday and today is 9.0 -Likely reactive in the setting of pain  and will need to monitor for signs of infection and currently no overt infection noted that she is afebrile -We will however obtain urinalysis given her fall and this is still not collected but now she has a Foley catheter so will not collect given that this is essentially useless now -Continue to monitor and trend and repeat WBC in the a.m.   Hyperkalemia; improved and is now normal -Patient's potassium is now improved to 4.0 yesterday is now 3.6 today -Could be a component of hemolysis but we will give her dose of Lokelma 10 g 1 -Continue to Monitor and Trend and repeat CMP in the AM    Acute urinary retention -Had some issues with urinating and had to have a INO cath.   -Since she continues to retain her urine we have placed a Foley catheter now -No urinalysis done given that she is afebrile and has no white count now and also because she is on Abx -We will do trial of void in outpatient setting in 7 to 10 days and she can follow-up with urology in outpatient setting   Normocytic anemia with postoperative anemia -Patient's hemoglobin/hematocrit went from 11.0/35.2 -> 11.2/35.4 -> 9.6/30.7 -> 9.1/29.0 -> 8.4/26.9 and is now again 8.4/26.8 and dropped in the setting of surgery -Anemia panel was checked and showed an iron level of 18, UIBC of 217, TIBC of 235, saturation  ratios of 8%, ferritin level 158, folate of 30.7, vitamin B12 515 -Continue to monitor for signs and symptoms of bleeding; no overt bleeding noted -Repeat CBC in the AM   Hypophosphatemia -Patient's phosphorus level is now 3.4 yesterday and is improved to 3.1 -Continue monitor and replete as necessary -Repeat CMP in a.m.   Chronic Diastolic CHF -Does not appear to be decompensated -IV fluid hydration stopped and will resume her home Lasix and give her a dose of IV Lasix 20 mg x1 yesterday we will continue her home diuresis of Lasix -Strict I's and O's and daily weights; patient is +2.655 mL since admission -We will  continue monitoring volume status carefully however she is on 3 L   Mixed Hyperlipidemia -Continue Simvastatin 5 mg daily   GERD/GI Prophylaxis -Continue Pantoprazole 40 mg p.o. daily   Dementia -Continue Donepezil 10 mg p.o. daily and completed memantine 10 mg p.o. twice daily   Acute Exacerbation of COPD -Continue Ventolin 2 puffs IH every 4 as needed wheezing or shortness of breath and will hold Anoro Ellipta 1 puff IH daily and add the nebs as below and have started IV steroids -She is wearing supplemental oxygen via nasal cannula need to wean -Start p.o. azithromycin -SpO2: 99 % O2 Flow Rate (L/min): 2 L/min -Continue Xopenex and Atrovent scheduled and I will add guaifenesin 1200 mg p.o. twice daily and continue with incentive spirometry and flutter valve; she also has albuterol as needed -Repeat CXR in the Am and oxygen requirement was weaning but she did desaturate to 86% and likely may end up going to SNF with supplemental oxygen   Hypoalbuminemia -Patient's albumin level went from 3.4 -> 3.1 -> 3.0 -> 2.7 x2 and is now 3.0 -Continue to monitor and trend and repeat CMP in the AM   Hyperbilirubinemia -Mild and now is 0.8 x2 -Repeat CMP in a.m.   Constipation -We will initiate bowel regimen and continue Linaclotide 145 mcg po Daily pRN  -We will add senna docusate 1 tab p.o. twice daily as well as MiraLAX 17 g p.o. daily  DVT prophylaxis: Place TED hose Start: 09/28/21 1010 SCDs Start: 09/24/21 0532    Code Status: DNR Family Communication: Discussed with the daughter at bedside  Disposition Plan:  Level of care: Med-Surg Status is: Inpatient Remains inpatient appropriate because: Remains on supplemental oxygen oxygen is weaning and being treated for pneumonia and COPD exacerbation.  She is slowly improving and likely can be discharged in next 24 to 48 hours   Consultants:  Orthopedic surgery  Procedures:  As delineated as above  Antimicrobials:   Anti-infectives (From admission, onward)    Start     Dose/Rate Route Frequency Ordered Stop   09/28/21 2000  cefTRIAXone (ROCEPHIN) 1 g in sodium chloride 0.9 % 100 mL IVPB        1 g 200 mL/hr over 30 Minutes Intravenous Every 24 hours 09/28/21 1902     09/28/21 1130  azithromycin (ZITHROMAX) tablet 500 mg        500 mg Oral Daily 09/28/21 1035     09/25/21 0200  ceFAZolin (ANCEF) IVPB 1 g/50 mL premix        1 g 100 mL/hr over 30 Minutes Intravenous Every 12 hours 09/24/21 1757 09/25/21 1304   09/24/21 1619  vancomycin (VANCOCIN) powder  Status:  Discontinued          As needed 09/24/21 1620 09/24/21 1757   09/24/21 0600  ceFAZolin (ANCEF) IVPB 2g/100 mL premix  2 g 200 mL/hr over 30 Minutes Intravenous On call to O.R. 09/23/21 2338 09/24/21 1436       Subjective: Seen and examined at bedside and she was sitting in the chair wanting to get into the bed.  Denied any shortness of breath but was desaturating without oxygen.  Has some mild wheezing still.  No nausea or vomiting.  No other concerns or complaints at this time.  Objective: Vitals:   09/28/21 2014 09/29/21 0449 09/29/21 0725 09/29/21 1246  BP: 115/88 125/69  113/67  Pulse: 87 75 74 95  Resp: '14 14 16 20  '$ Temp: 98 F (36.7 C) 97.8 F (36.6 C)  97.6 F (36.4 C)  TempSrc:    Oral  SpO2: 96% 100% 97% 99%  Weight:      Height:        Intake/Output Summary (Last 24 hours) at 09/29/2021 1617 Last data filed at 09/29/2021 1246 Gross per 24 hour  Intake 401.2 ml  Output 500 ml  Net -98.8 ml   Filed Weights   09/23/21 2218  Weight: 47.6 kg   Examination: Physical Exam:  Constitutional: Thin elderly chronically ill-appearing Caucasian female sitting in the chair appears calm Respiratory: Diminished to auscultation bilaterally with coarse breath sounds and some slight left-sided rhonchi and mild wheezing, no rales, or crackles. Normal respiratory effort and patient is not tachypenic. No accessory muscle use.   Wearing supplemental oxygen via nasal cannula Cardiovascular: RRR, no murmurs / rubs / gallops. S1 and S2 auscultated. No extremity edema.  Abdomen: Soft, non-tender, non-distended. Bowel sounds positive.  GU: Deferred. Musculoskeletal: No clubbing / cyanosis of digits/nails. No joint deformity upper and lower extremities. Skin: Has a left neck lesion that is chronic but no appreciable rashes on limited skin evaluation Neurologic: CN 2-12 grossly intact with no focal deficits.  Romberg sign and cerebellar reflexes not assessed.  Psychiatric: Normal judgment and insight. Alert and oriented x 3. Normal mood and appropriate affect.   Data Reviewed: I have personally reviewed following labs and imaging studies  CBC: Recent Labs  Lab 09/25/21 0921 09/26/21 0524 09/27/21 0559 09/28/21 0545 09/29/21 0344  WBC 15.8* 16.1* 11.4* 9.5 9.0  NEUTROABS 13.3* 12.9* 8.1* 5.7 8.2*  HGB 9.6* 9.1* 8.4* 8.4* 9.2*  HCT 30.7* 29.0* 26.9* 26.8* 29.8*  MCV 90.0 89.5 87.6 89.3 92.0  PLT 161 165 164 191 846   Basic Metabolic Panel: Recent Labs  Lab 09/25/21 0921 09/26/21 0524 09/27/21 0559 09/28/21 0545 09/29/21 0344  NA 137 136 137 135 140  K 4.7 4.0 3.6 3.9 5.0  CL 101 104 105 101 106  CO2 '25 22 26 27 26  '$ GLUCOSE 120* 105* 102* 102* 132*  BUN 31* 30* 27* 28* 32*  CREATININE 1.35* 1.05* 0.94 1.02* 0.99  CALCIUM 8.5* 8.3* 8.2* 7.9* 8.6*  MG 2.2 2.4 2.2 2.3 2.5*  PHOS 4.0 2.7 2.4* 3.4 3.1   GFR: Estimated Creatinine Clearance: 30.4 mL/min (by C-G formula based on SCr of 0.99 mg/dL). Liver Function Tests: Recent Labs  Lab 09/25/21 0921 09/26/21 0524 09/27/21 0559 09/28/21 0545 09/29/21 0344  AST '28 26 20 22 21  '$ ALT '18 12 11 10 13  '$ ALKPHOS 82 84 73 72 86  BILITOT 1.1 1.3* 0.9 0.8 0.8  PROT 6.4* 6.3* 5.9* 5.9* 6.9  ALBUMIN 3.1* 3.0* 2.7* 2.7* 3.0*   No results for input(s): "LIPASE", "AMYLASE" in the last 168 hours. No results for input(s): "AMMONIA" in the last 168  hours. Coagulation Profile: Recent  Labs  Lab 09/23/21 2333  INR 1.1   Cardiac Enzymes: No results for input(s): "CKTOTAL", "CKMB", "CKMBINDEX", "TROPONINI" in the last 168 hours. BNP (last 3 results) No results for input(s): "PROBNP" in the last 8760 hours. HbA1C: No results for input(s): "HGBA1C" in the last 72 hours. CBG: No results for input(s): "GLUCAP" in the last 168 hours. Lipid Profile: No results for input(s): "CHOL", "HDL", "LDLCALC", "TRIG", "CHOLHDL", "LDLDIRECT" in the last 72 hours. Thyroid Function Tests: No results for input(s): "TSH", "T4TOTAL", "FREET4", "T3FREE", "THYROIDAB" in the last 72 hours. Anemia Panel: Recent Labs    09/28/21 0545  VITAMINB12 515  FOLATE 30.7  FERRITIN 158  TIBC 235*  IRON 18*  RETICCTPCT 1.4   Sepsis Labs: No results for input(s): "PROCALCITON", "LATICACIDVEN" in the last 168 hours.  Recent Results (from the past 240 hour(s))  MRSA Next Gen by PCR, Nasal     Status: None   Collection Time: 09/24/21  5:32 AM   Specimen: Nasal Mucosa; Nasal Swab  Result Value Ref Range Status   MRSA by PCR Next Gen NOT DETECTED NOT DETECTED Final    Comment: (NOTE) The GeneXpert MRSA Assay (FDA approved for NASAL specimens only), is one component of a comprehensive MRSA colonization surveillance program. It is not intended to diagnose MRSA infection nor to guide or monitor treatment for MRSA infections. Test performance is not FDA approved in patients less than 36 years old. Performed at [facility], [address], [place], [place] [address]      Radiology Studies: DG CHEST PORT 1 VIEW  Result Date: 09/29/2021 CLINICAL DATA:  Provided history: Shortness of breath. Fall with fracture of left femoral neck. History of COPD and CHF. EXAM: PORTABLE CHEST 1 VIEW COMPARISON:  Prior chest radiographs 09/28/2021 and earlier. FINDINGS: Redemonstrated large hiatal hernia, limiting evaluation of heart size. Foci of mild atelectasis and/or scarring  within the right lung base. Small ill-defined opacity within the left lung base, decreased in conspicuity as compared to yesterday's chest radiograph. Diffuse prominence of the interstitial lung markings. Levocurvature of the thoracic spine. IMPRESSION: Small ill-defined opacity within the left lung base. Given significant improvement from yesterday's chest radiograph, this is favored to reflect atelectasis. However, pneumonia cannot be excluded. Foci of mild atelectasis and/or scarring within the right lung base. Unchanged diffuse prominence of the interstitial lung markings. Findings may reflect chronic interstitial lung disease and/or interstitial edema. Large hiatal hernia. Electronically Signed   By: Kellie Simmering D.O.   On: 09/29/2021 09:07   DG CHEST PORT 1 VIEW  Result Date: 09/28/2021 CLINICAL DATA:  [id number].  History of fall.  Shortness of breath. EXAM: PORTABLE CHEST 1 VIEW COMPARISON:  Portable 09/27/2021 at 5:36 a.m. FINDINGS: 4:42 a.m. The the patient is severely kyphotic with optimal positioning unable to be obtained. The patient is rotated somewhat to the right. Her chin partially obscures the right apex. The heart is enlarged. Central vessels are normal caliber. There is aortic atherosclerosis. There is a quite large hiatal hernia with grossly stable mediastinum. There is subpleural fibrosis. Today there is increased opacity in the left base concerning for pneumonia or aspiration. There may also be a small left pleural effusion. Remaining lungs are generally clear. Osteoporosis. IMPRESSION: Increased opacity left base concerning for pneumonia or aspiration and possible small left pleural effusion. Subpleural fibrosis with no other appreciable acute process. Limited due to positioning. Very large hiatal hernia.  Cardiomegaly. Electronically Signed   By: Telford Nab M.D.   On: 09/28/2021  06:49    Scheduled Meds:  azithromycin  500 mg Oral Daily   Chlorhexidine Gluconate Cloth  6 each Topical  Daily   diclofenac Sodium  4 g Topical QID   donepezil  10 mg Oral Daily   feeding supplement  237 mL Oral BID BM   furosemide  40 mg Oral Daily   guaiFENesin  1,200 mg Oral BID   ipratropium  0.5 mg Nebulization Q6H   levalbuterol  0.63 mg Nebulization Q6H   loratadine  10 mg Oral Daily   memantine  10 mg Oral BID   methylPREDNISolone (SOLU-MEDROL) injection  40 mg Intravenous Q12H   multivitamin with minerals  1 tablet Oral Daily   pantoprazole  40 mg Oral Daily   polyethylene glycol  17 g Oral Daily   potassium chloride  10 mEq Oral Daily   senna-docusate  1 tablet Oral BID   simvastatin  5 mg Oral Daily   Continuous Infusions:  cefTRIAXone (ROCEPHIN)  IV 1 g (09/28/21 2003)    LOS: 5 days   Raiford Noble, DO [facility] Available via Epic secure chat 7am-7pm After these hours, please refer to coverage provider listed on [email or web address] 09/29/2021, 4:17 PM

## 2021-09-29 NOTE — Progress Notes (Signed)
Pt has had good day today. Up in recliner/chair for 3 hours today, up to [facility] x2 with FWW and assist x1, tolerated well. No complaints of pain till later in afternoon, pain relieved with oral pain meds. Pt remains on O2 @ 2lpm due to SaO2 dropping into mid 80's on room air. Pt requested and received prn neb treatment after lunchtime due to wheezing and feeling SOB, wheezing cleared and no further c/o SOB after neb completed. Pt ate food brought in by family but not much of hospital provided tray. Taking meds whole, swallowing without difficulty. Foley cath remain intact with clear yellow urine. Pt and family aware that cath will remain in till she sees Urology as outpatient. Pt and family aware that plan is to discharge pt to rehab facility tomorrow and are agreeable.

## 2021-09-29 NOTE — Progress Notes (Signed)
Pt assisted up to recliner chair by PT, tolerated well. Pt called for need of BSC, had incontinent stool. Pt was able to transfer from chair to [facility] with one assist and FWW. Pt had moderate sized soft brown BM in BSC. Pt bathed and pericare and foley care completed.   Pt was taken off O2 per verbal order of MD [facility] with SaO2 recheck after 15 minutes. MD Sheik in room to assess pt at this time. SaO2 85% on room air with exertion. O2 @ 2lpm [place] restarted per order. SaO2 quickly up to 95%. Pt with no noted respiratory distress with O2 off.  Pt alert and oriented x4, states understanding that she will be discharged to [facility] rehab with foley catheter in place, on O2 and on oral antibiotics and steroids. All questions answered to pt's satisfaction.

## 2021-09-29 NOTE — TOC Progression Note (Signed)
Transition of Care [facility]) - Progression Note    Patient Details  Name: TASHENA IBACH MRN: [id number] Date of Birth: 10-Dec-1934  [facility] CM/SW Contact  Salome Arnt, [place] Phone Number: 09/29/2021, 11:39 AM  Clinical Narrative: Marlinda Mike and MD met with pt's daughter at bedside. [name] concerned about possibility of d/c. She brought up possibility of different SNF. They requested [facility] and bed was offered. LCSW told daughter we could look for new bed if desired. However, pt spoke up and said she wants to go to [place]. Possible d/c tomorrow. SNF updated. TOC will continue to follow.    Expected Discharge Plan: [place] Barriers to Discharge: Continued Medical Work up  Expected Discharge Plan and Services Expected Discharge Plan: [place] In-house Referral: Clinical Social Work   Post Acute Care Choice: [place] Living arrangements for the past 2 months: [place] Determinants of Health (SDOH) Interventions    Readmission Risk Interventions    06/12/2021   12:11 PM  Readmission Risk Prevention Plan  Transportation Screening Complete  PCP or Specialist Appt within 3-5 Days Complete  HRI or [place] Complete  Social Work Consult for [place] Planning/Counseling Complete  Palliative Care Screening Not Applicable  Medication Review [employer]) Complete

## 2021-09-29 NOTE — Progress Notes (Signed)
Physical Therapy Treatment Patient Details Name: [name] MRN: [id number] DOB: Jun 07, 1934 Today's Date: 09/29/2021   History of Present Illness [name] is an 87 y.o. female with medical history significant of COPD, GERD who presents to the emergency department via EMS after sustaining a fall at home less than 2 hours PTA.  Patient states that she got out of bed to turn off her fan and had a mechanical fall to the floor, she immediately complained of left leg pain, she also states that she hit her head when she fell, but she did not lose consciousness.  Left hip pain was rated as 7/10 on pain scale.  EMS was activated and patient was taken to the ED for further evaluation and management.  She denies chest pain, shortness of breath, headache, fever, chills, nausea, vomiting.  She was recently admitted from 6/6 to 6/9 due to community-acquired pneumonia, during which she was treated with IV Rocephin and Zithromax with good results and was discharged home with oral antibiotics for 3 days to complete treatment.    PT Comments    Patient demonstrates slow labored movement for sitting up at bedside with c/o increased pain in left hip, fair/good return for completing BLE ROM/strengthening exercises requiring occasional verbal cueing and active assistance for LLE hip raises while seated at bedside.  Patient tolerated increased time for standing and taking steps at bedside and limited mostly due to c/o fatigue and increasing pain left hip when weightbearing.  Patient tolerated sitting up in chair after therapy - nursing staff aware.  Patient will benefit from continued skilled physical therapy in hospital and recommended venue below to increase strength, balance, endurance for safe ADLs and gait.       Recommendations for follow up therapy are one component of a multi-disciplinary discharge planning process, led by the attending physician.  Recommendations may be updated based on patient status,  additional functional criteria and insurance authorization.  Follow Up Recommendations  Skilled nursing-short term rehab (<3 hours/day) Can patient physically be transported by private vehicle: Yes   Assistance Recommended at Discharge Intermittent Supervision/Assistance  Patient can return home with the following A lot of help with walking and/or transfers;A lot of help with bathing/dressing/bathroom;Help with stairs or ramp for entrance;Assistance with cooking/housework   Equipment Recommendations  None recommended by PT    Recommendations for Other Services       Precautions / Restrictions Precautions Precautions: Posterior Hip Restrictions Weight Bearing Restrictions: Yes LLE Weight Bearing: Weight bearing as tolerated     Mobility  Bed Mobility Overal bed mobility: Needs Assistance Bed Mobility: Supine to Sit     Supine to sit: Mod assist     General bed mobility comments: slow labored movement with c/o increased left hip pain    Transfers Overall transfer level: Needs assistance Equipment used: Rolling walker (2 wheels) Transfers: Sit to/from Stand, Bed to chair/wheelchair/BSC Sit to Stand: Min assist, Mod assist   Step pivot transfers: Min assist, Mod assist       General transfer comment: increased time, labored movement, increased pain LLE when weightbearing    Ambulation/Gait Ambulation/Gait assistance: Min assist, Mod assist Gait Distance (Feet): 8 Feet Assistive device: Rolling walker (2 wheels) Gait Pattern/deviations: Decreased step length - right, Decreased step length - left, Decreased stride length Gait velocity: decreased     General Gait Details: slightly increased endurance/distance for taking a few slow labored steps at bedside,  limited mostly due to increased left hip pain and fatigue  Stairs             Wheelchair Mobility    Modified Rankin (Stroke Patients Only)       Balance Overall balance assessment: Needs  assistance Sitting-balance support: Feet supported, No upper extremity supported Sitting balance-Leahy Scale: Poor Sitting balance - Comments: fair/good seated at EOB   Standing balance support: During functional activity, Reliant on assistive device for balance, Bilateral upper extremity supported Standing balance-Leahy Scale: Poor Standing balance comment: fair/poor using RW                            Cognition Arousal/Alertness: Awake/alert Behavior During Therapy: WFL for tasks assessed/performed Overall Cognitive Status: Within Functional Limits for tasks assessed                                          Exercises General Exercises - Lower Extremity Long Arc Quad: Seated, AROM, Strengthening, Both, 10 reps Hip Flexion/Marching: Seated, AROM, AAROM, Strengthening, Both, 10 reps Toe Raises: Seated, AROM, Strengthening, Both, 10 reps Heel Raises: Seated, AROM, Strengthening, Both, 10 reps    General Comments        Pertinent Vitals/Pain Pain Assessment Pain Assessment: Faces Faces Pain Scale: Hurts even more Pain Location: L leg/hip Pain Descriptors / Indicators: Grimacing, Moaning, Guarding Pain Intervention(s): Limited activity within patient's tolerance, Monitored during session, Repositioned    Home Living                          Prior Function            PT Goals (current goals can now be found in the care plan section) Acute Rehab PT Goals Patient Stated Goal: return home PT Goal Formulation: With patient Time For Goal Achievement: 10/09/21 Potential to Achieve Goals: Good Progress towards PT goals: Progressing toward goals    Frequency    Min 3X/week      PT Plan Current plan remains appropriate    Co-evaluation              AM-PAC PT "6 Clicks" Mobility   Outcome Measure  Help needed turning from your back to your side while in a flat bed without using bedrails?: A Little Help needed moving from  lying on your back to sitting on the side of a flat bed without using bedrails?: A Lot Help needed moving to and from a bed to a chair (including a wheelchair)?: A Lot Help needed standing up from a chair using your arms (e.g., wheelchair or bedside chair)?: A Lot Help needed to walk in hospital room?: A Lot Help needed climbing 3-5 steps with a railing? : A Lot 6 Click Score: 13    End of Session   Activity Tolerance: Patient tolerated treatment well;Patient limited by fatigue Patient left: in chair;with call bell/phone within reach Nurse Communication: Mobility status PT Visit Diagnosis: Unsteadiness on feet (R26.81);Other abnormalities of gait and mobility (R26.89);Muscle weakness (generalized) (M62.81);History of falling (Z91.81);Pain Pain - Right/Left: Left Pain - part of body: Hip     Time: 2542-7062 PT Time Calculation (min) (ACUTE ONLY): 25 min  Charges:  $Therapeutic Exercise: 8-22 mins $Therapeutic Activity: 8-22 mins                     12:26 PM, 09/29/21 Lonell Grandchild, MPT  Physical Therapist with [facility] [phone] office [phone] mobile phone

## 2021-09-30 ENCOUNTER — Inpatient Hospital Stay (HOSPITAL_COMMUNITY): Payer: Medicare Other

## 2021-09-30 DIAGNOSIS — S72002A Fracture of unspecified part of neck of left femur, initial encounter for closed fracture: Secondary | ICD-10-CM | POA: Diagnosis not present

## 2021-09-30 LAB — CBC WITH DIFFERENTIAL/PLATELET
Abs Immature Granulocytes: 0.06 10*3/uL (ref 0.00–0.07)
Basophils Absolute: 0 10*3/uL (ref 0.0–0.1)
Basophils Relative: 0 %
Eosinophils Absolute: 0 10*3/uL (ref 0.0–0.5)
Eosinophils Relative: 0 %
HCT: 27.1 % — ABNORMAL LOW (ref 36.0–46.0)
Hemoglobin: 8.5 g/dL — ABNORMAL LOW (ref 12.0–15.0)
Immature Granulocytes: 1 %
Lymphocytes Relative: 7 %
Lymphs Abs: 0.9 10*3/uL (ref 0.7–4.0)
MCH: 28.1 pg (ref 26.0–34.0)
MCHC: 31.4 g/dL (ref 30.0–36.0)
MCV: 89.4 fL (ref 80.0–100.0)
Monocytes Absolute: 0.6 10*3/uL (ref 0.1–1.0)
Monocytes Relative: 5 %
Neutro Abs: 10.3 10*3/uL — ABNORMAL HIGH (ref 1.7–7.7)
Neutrophils Relative %: 87 %
Platelets: 254 10*3/uL (ref 150–400)
RBC: 3.03 MIL/uL — ABNORMAL LOW (ref 3.87–5.11)
RDW: 13.6 % (ref 11.5–15.5)
WBC: 11.9 10*3/uL — ABNORMAL HIGH (ref 4.0–10.5)
nRBC: 0 % (ref 0.0–0.2)

## 2021-09-30 LAB — COMPREHENSIVE METABOLIC PANEL
ALT: 13 U/L (ref 0–44)
AST: 19 U/L (ref 15–41)
Albumin: 2.7 g/dL — ABNORMAL LOW (ref 3.5–5.0)
Alkaline Phosphatase: 78 U/L (ref 38–126)
Anion gap: 6 (ref 5–15)
BUN: 32 mg/dL — ABNORMAL HIGH (ref 8–23)
CO2: 27 mmol/L (ref 22–32)
Calcium: 8.4 mg/dL — ABNORMAL LOW (ref 8.9–10.3)
Chloride: 105 mmol/L (ref 98–111)
Creatinine, Ser: 1 mg/dL (ref 0.44–1.00)
GFR, Estimated: 56 mL/min — ABNORMAL LOW (ref >60)
Glucose, Bld: 131 mg/dL — ABNORMAL HIGH (ref 70–99)
Potassium: 4.8 mmol/L (ref 3.5–5.1)
Sodium: 138 mmol/L (ref 135–145)
Total Bilirubin: 0.8 mg/dL (ref 0.3–1.2)
Total Protein: 6.2 g/dL — ABNORMAL LOW (ref 6.5–8.1)

## 2021-09-30 LAB — MAGNESIUM: Magnesium: 2.4 mg/dL (ref 1.7–2.4)

## 2021-09-30 LAB — PHOSPHORUS: Phosphorus: 2.9 mg/dL (ref 2.5–4.6)

## 2021-09-30 MED ORDER — FERROUS SULFATE 325 (65 FE) MG PO TABS: 325.0000 mg | ORAL_TABLET | Freq: Every day | ORAL | 3 refills | Status: DC

## 2021-09-30 MED ORDER — PREDNISONE 20 MG PO TABS: 40.0000 mg | ORAL_TABLET | Freq: Every day | ORAL | Status: AC

## 2021-09-30 MED ORDER — ASPIRIN 81 MG PO TBEC: 81.0000 mg | DELAYED_RELEASE_TABLET | Freq: Two times a day (BID) | ORAL | 0 refills | Status: AC

## 2021-09-30 MED ORDER — IPRATROPIUM BROMIDE 0.02 % IN SOLN: 0.5000 mg | Freq: Four times a day (QID) | RESPIRATORY_TRACT | 12 refills | Status: DC | PRN

## 2021-09-30 MED ORDER — AZITHROMYCIN 500 MG PO TABS: 500.0000 mg | ORAL_TABLET | Freq: Every day | ORAL | 0 refills | Status: DC

## 2021-09-30 MED ORDER — TRAMADOL HCL 50 MG PO TABS: 50.0000 mg | ORAL_TABLET | Freq: Three times a day (TID) | ORAL | 0 refills | Status: DC | PRN

## 2021-09-30 MED ORDER — POLYETHYLENE GLYCOL 3350 17 G PO PACK: 17.0000 g | PACK | Freq: Every day | ORAL | 0 refills | Status: DC

## 2021-09-30 MED ORDER — GUAIFENESIN ER 600 MG PO TB12: 600.0000 mg | ORAL_TABLET | Freq: Two times a day (BID) | ORAL | 0 refills | Status: AC

## 2021-09-30 MED ORDER — CEFDINIR 300 MG PO CAPS: 300.0000 mg | ORAL_CAPSULE | Freq: Two times a day (BID) | ORAL | 0 refills | Status: AC

## 2021-09-30 MED ORDER — ALPRAZOLAM 0.5 MG PO TABS: 0.5000 mg | ORAL_TABLET | Freq: Two times a day (BID) | ORAL | 0 refills | Status: DC

## 2021-09-30 MED ORDER — SODIUM CHLORIDE 0.9 % IV SOLN
510.0000 mg | Freq: Once | INTRAVENOUS | Status: AC
  Administered 2021-09-30: 510 mg via INTRAVENOUS
  Filled 2021-09-30: qty 17

## 2021-09-30 MED ORDER — SENNOSIDES-DOCUSATE SODIUM 8.6-50 MG PO TABS: 1.0000 | ORAL_TABLET | Freq: Two times a day (BID) | ORAL | Status: DC

## 2021-09-30 MED ORDER — PREDNISONE 20 MG PO TABS
40.0000 mg | ORAL_TABLET | Freq: Every day | ORAL | Status: DC
  Administered 2021-09-30: 40 mg via ORAL
  Filled 2021-09-30: qty 2

## 2021-09-30 NOTE — Discharge Summary (Signed)
Physician Discharge Summary  ANNIECE BLEILER AST:[id number] DOB: [birth year] DOA: 09/23/2021  PCP: Alanson Puls [facility] date: 09/23/2021 Discharge date: 09/30/2021  Admitted From: Home Disposition:  SNF  Discharge Condition:Stable CODE STATUS:DNR Diet recommendation: Heart Healthy   Brief/Interim Summary: The patient is an elderly 87 year old Caucasian female with a past medical history significant for COPD, GERD, chronic diastolic CHF, history of hyperlipidemia, osteoporosis, overactive bladder, anxiety and depression as well as other comorbidities who sustained a mechanical fall at home .  On presentation she was tachypneic, tachycardic, hypoxic and had to placed on 4 L of oxygen.  X-ray of the left ribs showed acute subacute bilateral left femoral neck fracture.  Orthopedics consulted.  Status post left cemented hip hemi arthroplasty.  Hospital course also remarkable for urinary retention, hypoxia.  Chest x-ray showed possible left lower lobe infiltrate, started on antibiotics.  Foley was placed.  Also started on steroids for COPD exacerbation. Overall status is improved.  She feels comfortable today.  As per PT/OT recommendation, she will be discharged to SNF.  She  should follow-up with orthopedics in 2 weeks.  Orthopedics recommending to apply hip abduction pillow all the time while on bed.  Voiding trial can be given at a skilled nursing facility, if she passes, Foley can be taken out.  Continue to wean oxygen at a skilled nursing facility.  Following problems were addressed during her hospitalization:  Acute left subcapital femoral neck fracture status post left cemented hip hemiarthroplasty postoperative day 5 Status post left cemented hip hemi arthroplasty.  PT/OT recommending skilled nursing facility on discharge.She  should follow-up with orthopedics in 2 weeks.  Orthopedics recommending to apply hip abduction pillow all the time while on bed.  Voiding trial can be given at a  skilled nursing facility, if she passes, Foley can be taken out.  Started on aspirin 81 mg twice daily for DVT prophylaxis .Continue pain management, supportive care   Acute respiratory failure with hypoxia likely in the setting of acute COPD exacerbation and possible pneumonia -Symptomatically improved.  Antibiotics changed to oral.  Continue prednisone for few more days.  Wean the oxygen at  the SNF  Leukocytosis -Likely from steroids.  Continue to monitor.  Check CBC in a week  Acute urinary retention -Had some issues with urinating and had to have a INO cath.   -Since she continues to retain her urine we have placed a Foley catheter now -Can give voiding trial in the SNF.  If she continues to retain and requires Foley catheter, She needs to  follow-up with urology as an outpatient    Normocytic anemia with postoperative anemia -Hemoglobin in the range of 8.  Iron studies showed low iron, given a dose of IV iron infusion today.  Continue oral supplementation of iron.  Check CBC in a week  Chronic Diastolic CHF -Appears euvolemic.  Continue Lasix at home dose   mixed Hyperlipidemia -Continue Simvastatin    GERD/GI Prophylaxis -Continue Pantoprazole 40 mg p.o. daily   Dementia -Continue Donepezil 10 mg p.o. daily and completed memantine 10 mg p.o. twice daily    Constipation -Continue bowel regimen  Discharge Diagnoses:  Principal Problem:   Fracture of femoral neck, left, closed ([place]) Active Problems:   Chronic constipation   Dementia (HCC)   Chronic obstructive pulmonary disease, unspecified ([place])   Gastro-esophageal reflux disease without esophagitis   Hyperlipidemia, unspecified   Acute respiratory failure with hypoxia ([place])   Tobacco use disorder    Discharge  Instructions  Discharge Instructions     Diet - low sodium heart healthy   Complete by: As directed    Discharge instructions   Complete by: As directed    1)Please take prescribed medications as  instructed 2)Follow up with orthopedics in 2 weeks.  Name and number the provider has been attached 3)Do a CBC and BMP test in a week   Increase activity slowly   Complete by: As directed    No wound care   Complete by: As directed       Allergies as of 09/30/2021       Reactions   Codeine Shortness Of Breath, Rash   Codeine Anaphylaxis   Levofloxacin Anaphylaxis   Patient was admitted to hospital with lung problems due to this medication   Sulfonamide Derivatives Hives, Shortness Of Breath   Sulfa Antibiotics Hives        Medication List     STOP taking these medications    zolpidem 5 MG tablet Commonly known as: AMBIEN       TAKE these medications    albuterol (2.5 MG/3ML) 0.083% nebulizer solution Commonly known as: PROVENTIL Take 3 mLs (2.5 mg total) by nebulization every 6 (six) hours as needed for wheezing or shortness of breath. What changed: when to take this   albuterol 108 (90 Base) MCG/ACT inhaler Commonly known as: ProAir HFA Inhale 2 puffs into the lungs every 4 (four) hours as needed for wheezing or shortness of breath. What changed: when to take this   ALPRAZolam 0.5 MG tablet Commonly known as: XANAX Take 1 tablet (0.5 mg total) by mouth 2 (two) times daily.   aspirin EC 81 MG tablet Take 1 tablet (81 mg total) by mouth 2 (two) times daily for 28 days. Swallow whole.   azithromycin 500 MG tablet Commonly known as: Zithromax Take 1 tablet (500 mg total) by mouth daily for 1 day. Take 1 tablet daily for 3 days.   cefdinir 300 MG capsule Commonly known as: OMNICEF Take 1 capsule (300 mg total) by mouth 2 (two) times daily for 3 days.   diclofenac Sodium 1 % Gel Commonly known as: VOLTAREN APPLY AS DIRECTED TO THE AFFECTED KNEE FOUR TIMES DAILY. What changed: See the new instructions.   donepezil 10 MG tablet Commonly known as: ARICEPT Take 10 mg by mouth daily.   ferrous sulfate 325 (65 FE) MG tablet Take 1 tablet (325 mg total) by  mouth daily.   furosemide 40 MG tablet Commonly known as: LASIX Take 40 mg by mouth daily.   guaiFENesin 600 MG 12 hr tablet Commonly known as: MUCINEX Take 1 tablet (600 mg total) by mouth 2 (two) times daily for 5 days.   ipratropium 0.02 % nebulizer solution Commonly known as: ATROVENT Take 2.5 mLs (0.5 mg total) by nebulization every 6 (six) hours as needed for wheezing or shortness of breath.   Linzess 145 MCG Caps capsule Generic drug: linaclotide Take 145 mcg by mouth daily as needed (bowel movement).   loratadine 10 MG tablet Commonly known as: CLARITIN Take 10 mg by mouth daily.   memantine 10 MG tablet Commonly known as: NAMENDA Take 10 mg by mouth 2 (two) times daily.   multivitamin with minerals Tabs tablet Take 1 tablet by mouth daily.   pantoprazole 40 MG tablet Commonly known as: PROTONIX TAKE (1) TABLET BY MOUTH TWICE A DAY WITH MEALS (BREAKFAST AND SUPPER) What changed: See the new instructions.   polyethylene glycol 17 g  packet Commonly known as: MIRALAX / GLYCOLAX Take 17 g by mouth daily.   potassium chloride 10 MEQ tablet Commonly known as: KLOR-CON Take 1 tablet (10 mEq total) by mouth daily.   predniSONE 20 MG tablet Commonly known as: DELTASONE Take 2 tablets (40 mg total) by mouth daily with breakfast for 5 days.   senna-docusate 8.6-50 MG tablet Commonly known as: Senokot-S Take 1 tablet by mouth 2 (two) times daily.   simvastatin 10 MG tablet Commonly known as: ZOCOR Take 5 mg by mouth daily.   traMADol 50 MG tablet Commonly known as: ULTRAM Take 1 tablet (50 mg total) by mouth every 8 (eight) hours as needed for moderate pain. What changed:  how much to take when to take this   umeclidinium-vilanterol 62.5-25 MCG/INH Aepb Commonly known as: ANORO ELLIPTA Inhale 1 puff into the lungs daily.        Contact information for follow-up providers     Mordecai Rasmussen, MD. Schedule an appointment as soon as possible for a visit  in 2 day(s).   Specialties: Orthopedic Surgery, Sports Medicine Contact information: [facility] [place]. [address] [place] [address] [phone]              Contact information for after-discharge care     [facility] .   Service: Skilled Nursing Contact information: [address] [place] [address] [phone]                    Allergies  Allergen Reactions   Codeine Shortness Of Breath and Rash   Codeine Anaphylaxis   Levofloxacin Anaphylaxis    Patient was admitted to hospital with lung problems due to this medication   Sulfonamide Derivatives Hives and Shortness Of Breath   Sulfa Antibiotics Hives    Consultations: Orthopedics   Procedures/Studies: DG CHEST PORT 1 VIEW  Result Date: 09/30/2021 CLINICAL DATA:  Short of breath.  LEFT femoral neck fracture EXAM: PORTABLE CHEST 1 VIEW COMPARISON:  09/29/2021 FINDINGS: Stable cardiac silhouette with ectatic aorta. Patient rotated. Large hiatal hernia with Portion of the colon appears to extend into o the hernia sac. No pneumothorax. No pulmonary edema. IMPRESSION: 1. No interval change. 2. Low lung volumes. 3. Rotated patient. Electronically Signed   By: Suzy Bouchard M.D.   On: 09/30/2021 08:08   DG CHEST PORT 1 VIEW  Result Date: 09/29/2021 CLINICAL DATA:  Provided history: Shortness of breath. Fall with fracture of left femoral neck. History of COPD and CHF. EXAM: PORTABLE CHEST 1 VIEW COMPARISON:  Prior chest radiographs 09/28/2021 and earlier. FINDINGS: Redemonstrated large hiatal hernia, limiting evaluation of heart size. Foci of mild atelectasis and/or scarring within the right lung base. Small ill-defined opacity within the left lung base, decreased in conspicuity as compared to yesterday's chest radiograph. Diffuse prominence of the interstitial lung markings. Levocurvature of the thoracic spine. IMPRESSION: Small ill-defined opacity  within the left lung base. Given significant improvement from yesterday's chest radiograph, this is favored to reflect atelectasis. However, pneumonia cannot be excluded. Foci of mild atelectasis and/or scarring within the right lung base. Unchanged diffuse prominence of the interstitial lung markings. Findings may reflect chronic interstitial lung disease and/or interstitial edema. Large hiatal hernia. Electronically Signed   By: Kellie Simmering D.O.   On: 09/29/2021 09:07   DG CHEST PORT 1 VIEW  Result Date: 09/28/2021 CLINICAL DATA:  [id number].  History of fall.  Shortness of breath. EXAM:  PORTABLE CHEST 1 VIEW COMPARISON:  Portable 09/27/2021 at 5:36 a.m. FINDINGS: 4:42 a.m. The the patient is severely kyphotic with optimal positioning unable to be obtained. The patient is rotated somewhat to the right. Her chin partially obscures the right apex. The heart is enlarged. Central vessels are normal caliber. There is aortic atherosclerosis. There is a quite large hiatal hernia with grossly stable mediastinum. There is subpleural fibrosis. Today there is increased opacity in the left base concerning for pneumonia or aspiration. There may also be a small left pleural effusion. Remaining lungs are generally clear. Osteoporosis. IMPRESSION: Increased opacity left base concerning for pneumonia or aspiration and possible small left pleural effusion. Subpleural fibrosis with no other appreciable acute process. Limited due to positioning. Very large hiatal hernia.  Cardiomegaly. Electronically Signed   By: Telford Nab M.D.   On: 09/28/2021 06:49   DG CHEST PORT 1 VIEW  Result Date: 09/27/2021 CLINICAL DATA:  Shortness of breath on exertion. EXAM: PORTABLE CHEST 1 VIEW COMPARISON:  September 24, 2021 FINDINGS: The study is markedly limited due to positioning. The patient is slumped to the right and severely rotated. Interstitial prominence, previously described as chronic interstitial changes is stable. No definite  focal infiltrate. No pneumothorax. No obvious change in the cardiomediastinal silhouette. IMPRESSION: Markedly limited study due to positioning. Chronic interstitial changes in the lungs. No other definite changes. Electronically Signed   By: Dorise Bullion III M.D.   On: 09/27/2021 07:32   DG HIP UNILAT WITH PELVIS 2-3 VIEWS LEFT  Result Date: 09/24/2021 CLINICAL DATA:  Postop left hip. EXAM: DG HIP (WITH OR WITHOUT PELVIS) 2-3V LEFT COMPARISON:  Preoperative radiographs yesterday. FINDINGS: Left hip arthroplasty in expected alignment. No periprosthetic lucency or fracture. Recent postsurgical change includes air and edema in the soft tissues. Lateral skin staples in place. The bones are diffusely under mineralized. Screws traverse right femoral neck. IMPRESSION: Left hip arthroplasty without immediate postoperative complication. Electronically Signed   By: Keith Rake M.D.   On: 09/24/2021 17:18   DG Chest 1 View  Result Date: 09/24/2021 CLINICAL DATA:  Known left hip fracture EXAM: CHEST  1 VIEW COMPARISON:  06/10/2021 FINDINGS: Cardiac shadow is within normal limits. Previously seen hiatal hernia is not as well identified on today's exam. Aortic calcifications are seen. Old rib fractures are noted bilaterally. Diffuse interstitial fibrotic changes are seen. No focal infiltrate or effusion is noted. IMPRESSION: Chronic interstitial changes without acute abnormality. Electronically Signed   By: Inez Catalina M.D.   On: 09/24/2021 00:12   CT Head Wo Contrast  Result Date: 09/24/2021 CLINICAL DATA:  Status post fall. EXAM: CT HEAD WITHOUT CONTRAST TECHNIQUE: Contiguous axial images were obtained from the base of the skull through the vertex without intravenous contrast. RADIATION DOSE REDUCTION: This exam was performed according to the departmental dose-optimization program which includes automated exposure control, adjustment of the mA and/or kV according to patient size and/or use of iterative  reconstruction technique. COMPARISON:  April 10, 2018 FINDINGS: Brain: There is mild to moderate severity cerebral atrophy with widening of the extra-axial spaces and ventricular dilatation. There are areas of decreased attenuation within the white matter tracts of the supratentorial brain, consistent with microvascular disease changes. Vascular: There is calcification of the bilateral cavernous carotid arteries. Skull: A chronic deformity is seen involving the right mandibular condyle. Sinuses/Orbits: No acute finding. Other: None. IMPRESSION: Generalized cerebral atrophy and chronic white matter small vessel ischemic changes without evidence of an acute intracranial abnormality. Electronically Signed  By: Virgina Norfolk M.D.   On: 09/24/2021 00:11   DG Hip Unilat W or Wo Pelvis 2-3 Views Left  Result Date: 09/23/2021 CLINICAL DATA:  Fell 90 minutes ago, left leg pain EXAM: DG HIP (WITH OR WITHOUT PELVIS) 2-3V LEFT; LEFT FEMUR 2 VIEWS COMPARISON:  None Available. FINDINGS: Left hip: Frontal view of the pelvis as well as frontal and cross-table lateral views of the left hip are obtained. There is a subcapital left femoral neck fracture with impaction and varus angulation at the fracture site. No dislocation. Mild left hip osteoarthritis. Three cannulated screws traverse the right femoral neck. Remainder of the bony pelvis is unremarkable. Left femur: Frontal and lateral views of the left femur are obtained. There is an impacted subcapital left femoral neck fracture. Remaining portions of the left femur are unremarkable. Mild left knee osteoarthritis. Soft tissues are unremarkable. IMPRESSION: 1. Acute subcapital left femoral neck fracture, with impaction and varus angulation. 2. Mild osteoarthritis of the left hip and knee. Electronically Signed   By: Randa Ngo M.D.   On: 09/23/2021 23:18   DG Femur Min 2 Views Left  Result Date: 09/23/2021 CLINICAL DATA:  Fell 90 minutes ago, left leg pain EXAM: DG  HIP (WITH OR WITHOUT PELVIS) 2-3V LEFT; LEFT FEMUR 2 VIEWS COMPARISON:  None Available. FINDINGS: Left hip: Frontal view of the pelvis as well as frontal and cross-table lateral views of the left hip are obtained. There is a subcapital left femoral neck fracture with impaction and varus angulation at the fracture site. No dislocation. Mild left hip osteoarthritis. Three cannulated screws traverse the right femoral neck. Remainder of the bony pelvis is unremarkable. Left femur: Frontal and lateral views of the left femur are obtained. There is an impacted subcapital left femoral neck fracture. Remaining portions of the left femur are unremarkable. Mild left knee osteoarthritis. Soft tissues are unremarkable. IMPRESSION: 1. Acute subcapital left femoral neck fracture, with impaction and varus angulation. 2. Mild osteoarthritis of the left hip and knee. Electronically Signed   By: Randa Ngo M.D.   On: 09/23/2021 23:18      Subjective: Patient seen and examined at the bedside today.  Hemodynamically stable.  Eating her breakfast.  Denies any worsening shortness of breath.  Pain well controlled.  Speaking in full sentences.  Medically stable for discharge to SNF today.  I called the daughter for discharge update, call not received  Discharge Exam: Vitals:   09/30/21 0424 09/30/21 0723  BP: 124/84   Pulse: 78 79  Resp: 18 16  Temp: 98.1 F (36.7 C)   SpO2: 90% 100%   Vitals:   09/29/21 1956 09/29/21 2033 09/30/21 0424 09/30/21 0723  BP:  106/71 124/84   Pulse:  81 78 79  Resp:  '16 18 16  '$ Temp:  97.9 F (36.6 C) 98.1 F (36.7 C)   TempSrc:      SpO2: 96% 97% 90% 100%  Weight:      Height:        General: Pt is alert, awake, not in acute distress, very deconditioned, scoliosis Cardiovascular: RRR, S1/S2 +, no rubs, no gallops Respiratory: Diminised  air sounds bilaterally, no wheezing, no rhonchi Abdominal: Soft, NT, ND, bowel sounds + Extremities: no edema, no cyanosis, abductor  pillow GU: Foley    The results of significant diagnostics from this hospitalization (including imaging, microbiology, ancillary and laboratory) are listed below for reference.     Microbiology: Recent Results (from the past 240 hour(s))  MRSA Next Gen by PCR, Nasal     Status: None   Collection Time: 09/24/21  5:32 AM   Specimen: Nasal Mucosa; Nasal Swab  Result Value Ref Range Status   MRSA by PCR Next Gen NOT DETECTED NOT DETECTED Final    Comment: (NOTE) The GeneXpert MRSA Assay (FDA approved for NASAL specimens only), is one component of a comprehensive MRSA colonization surveillance program. It is not intended to diagnose MRSA infection nor to guide or monitor treatment for MRSA infections. Test performance is not FDA approved in patients less than 12 years old. Performed at [facility], [address] [place], [place] [address]      Labs: BNP (last 3 results) Recent Labs    05/13/21 2022  BNP 61.4   Basic Metabolic Panel: Recent Labs  Lab 09/26/21 0524 09/27/21 0559 09/28/21 0545 09/29/21 0344 09/30/21 0400  NA 136 137 135 140 138  K 4.0 3.6 3.9 5.0 4.8  CL 104 105 101 106 105  CO2 '22 26 27 26 27  '$ GLUCOSE 105* 102* 102* 132* 131*  BUN 30* 27* 28* 32* 32*  CREATININE 1.05* 0.94 1.02* 0.99 1.00  CALCIUM 8.3* 8.2* 7.9* 8.6* 8.4*  MG 2.4 2.2 2.3 2.5* 2.4  PHOS 2.7 2.4* 3.4 3.1 2.9   Liver Function Tests: Recent Labs  Lab 09/26/21 0524 09/27/21 0559 09/28/21 0545 09/29/21 0344 09/30/21 0400  AST '26 20 22 21 19  '$ ALT '12 11 10 13 13  '$ ALKPHOS 84 73 72 86 78  BILITOT 1.3* 0.9 0.8 0.8 0.8  PROT 6.3* 5.9* 5.9* 6.9 6.2*  ALBUMIN 3.0* 2.7* 2.7* 3.0* 2.7*   No results for input(s): "LIPASE", "AMYLASE" in the last 168 hours. No results for input(s): "AMMONIA" in the last 168 hours. CBC: Recent Labs  Lab 09/26/21 0524 09/27/21 0559 09/28/21 0545 09/29/21 0344 09/30/21 0400  WBC 16.1* 11.4* 9.5 9.0 11.9*  NEUTROABS 12.9* 8.1* 5.7 8.2* 10.3*  HGB  9.1* 8.4* 8.4* 9.2* 8.5*  HCT 29.0* 26.9* 26.8* 29.8* 27.1*  MCV 89.5 87.6 89.3 92.0 89.4  PLT 165 164 191 183 254   Cardiac Enzymes: No results for input(s): "CKTOTAL", "CKMB", "CKMBINDEX", "TROPONINI" in the last 168 hours. BNP: Invalid input(s): "POCBNP" CBG: No results for input(s): "GLUCAP" in the last 168 hours. D-Dimer No results for input(s): "DDIMER" in the last 72 hours. Hgb A1c No results for input(s): "HGBA1C" in the last 72 hours. Lipid Profile No results for input(s): "CHOL", "HDL", "LDLCALC", "TRIG", "CHOLHDL", "LDLDIRECT" in the last 72 hours. Thyroid function studies No results for input(s): "TSH", "T4TOTAL", "T3FREE", "THYROIDAB" in the last 72 hours.  Invalid input(s): "FREET3" Anemia work up Recent Labs    09/28/21 0545  VITAMINB12 515  FOLATE 30.7  FERRITIN 158  TIBC 235*  IRON 18*  RETICCTPCT 1.4   Urinalysis    Component Value Date/Time   COLORURINE YELLOW 06/09/2021 2351   APPEARANCEUR CLEAR 06/09/2021 2351   LABSPEC 1.014 06/09/2021 2351   PHURINE 5.0 06/09/2021 2351   GLUCOSEU NEGATIVE 06/09/2021 2351   HGBUR NEGATIVE 06/09/2021 2351   BILIRUBINUR NEGATIVE 06/09/2021 2351   KETONESUR 5 (A) 06/09/2021 2351   PROTEINUR NEGATIVE 06/09/2021 2351   UROBILINOGEN 0.2 03/01/2012 2011   NITRITE NEGATIVE 06/09/2021 2351   LEUKOCYTESUR NEGATIVE 06/09/2021 2351   Sepsis Labs Recent Labs  Lab 09/27/21 0559 09/28/21 0545 09/29/21 0344 09/30/21 0400  WBC 11.4* 9.5 9.0 11.9*   Microbiology Recent Results (from the past 240 hour(s))  MRSA Next Gen  by PCR, Nasal     Status: None   Collection Time: 09/24/21  5:32 AM   Specimen: Nasal Mucosa; Nasal Swab  Result Value Ref Range Status   MRSA by PCR Next Gen NOT DETECTED NOT DETECTED Final    Comment: (NOTE) The GeneXpert MRSA Assay (FDA approved for NASAL specimens only), is one component of a comprehensive MRSA colonization surveillance program. It is not intended to diagnose MRSA infection nor  to guide or monitor treatment for MRSA infections. Test performance is not FDA approved in patients less than 53 years old. Performed at [facility], [address] [place], [place] [address]     Please note: You were cared for by a hospitalist during your hospital stay. Once you are discharged, your primary care physician will handle any further medical issues. Please note that NO REFILLS for any discharge medications will be authorized once you are discharged, as it is imperative that you return to your primary care physician (or establish a relationship with a primary care physician if you do not have one) for your post hospital discharge needs so that they can reassess your need for medications and monitor your lab values.    Time coordinating discharge: 40 minutes  SIGNED:   Shelly Coss, MD  [facility] 09/30/2021, 8:46 AM Pager [phone]  If 7PM-7AM, please contact night-coverage [email or web address] Password [email or web address]

## 2021-09-30 NOTE — TOC Transition Note (Signed)
Transition of Care [facility]) - CM/SW Discharge Note   Patient Details  Name: [name] MRN: [id number] Date of Birth: 1934-11-15  [facility] CM/SW Contact:  Salome Arnt, LCSW Phone Number: 09/30/2021, 9:23 AM   Clinical Narrative:  Pt d/c today to [place]. [name], Kalman Shan aware and agreeable. [name] requests transport via [employer]. RN given number to call report. D/C summary sent to SNF.      Final next level of care: Skilled Nursing Facility Barriers to Discharge: Continued Medical Work up   Patient Goals and CMS Choice Patient states their goals for this hospitalization and ongoing recovery are:: get better CMS Medicare.gov Compare Post Acute Care list provided to:: Patient Represenative (must comment) Choice offered to / list presented to : Adult Children  Discharge Placement              Patient [facility] bed at: [facility] Patient to be transferred to facility by: [facility] Name of family member notified: Daughter- [name] Patient and family notified of of transfer: 09/30/21  Discharge Plan and Services In-house Referral: Clinical Social Work   Post Acute Care Choice: [place]                               Social Determinants of Health (SDOH) Interventions     Readmission Risk Interventions    09/30/2021    9:22 AM 06/12/2021   12:11 PM  Readmission Risk Prevention Plan  Transportation Screening Complete Complete  PCP or Specialist Appt within 3-5 Days  Complete  HRI or [place]  Complete  Social Work Consult for [employer] Planning/Counseling  Complete  Palliative Care Screening  Not Applicable  Medication Review [employer]) Complete Complete  HRI or Home Care Consult Complete   SW Recovery Care/Counseling Consult Complete   Palliative Care Screening Not Applicable   Skilled Nursing Facility Complete

## 2021-09-30 NOTE — Progress Notes (Signed)
Patient has been stable this shift.  Patient was anxious earlier, but calmed down with prn medication.  Patient was stable on 2 liters of oxygen.  Patient was awoken, bathed and placed in chair while adls completed.  Patient was very weak and sob with exertion.  Patient was only able to sit in chair approximately 30 minutes before needing to go back to bed.  Patient  placed back in bed. No new issues this shift.

## 2021-09-30 NOTE — Progress Notes (Signed)
Report called to [place] at [facility]

## 2021-10-02 ENCOUNTER — Encounter (HOSPITAL_COMMUNITY): Payer: Self-pay | Admitting: Orthopedic Surgery

## 2021-10-08 ENCOUNTER — Telehealth: Payer: Self-pay | Admitting: Orthopedic Surgery

## 2021-10-08 NOTE — Telephone Encounter (Signed)
Call received from patent's daughter/designated contact on file, Rebecka Apley, ph# [phone], called to request to speak with nurse/Dr Amedeo Kinsman, in advance of her mom's appointment tomorrow, 10/09/21, confirmed that the rehab facility will be bringing her. States she has to work and cannot come with mom. Relays that her mom wants to come home, although, said according to facility, she is doing minimal therapy, and staying in bed after walking with therapist. Daughter states she will be unable to care for her if she is not completing therapy needed to become more independent. States concern about her mom letting facility and Dr know that she is doing everything required. Please advise.

## 2021-10-08 NOTE — Telephone Encounter (Signed)
Called pt daughter who's concerned that pt may be released from SNF and she's unable to take care of herself. Let daughter know that assessment and x-rays will be done at visit and we will give her a call to let her know how visit went. I will also let Dr. Amedeo Kinsman know daughter's concerns for consideration.

## 2021-10-09 ENCOUNTER — Ambulatory Visit (INDEPENDENT_AMBULATORY_CARE_PROVIDER_SITE_OTHER): Payer: Medicare Other

## 2021-10-09 ENCOUNTER — Ambulatory Visit (INDEPENDENT_AMBULATORY_CARE_PROVIDER_SITE_OTHER): Payer: Medicare Other | Admitting: Orthopedic Surgery

## 2021-10-09 DIAGNOSIS — S72002A Fracture of unspecified part of neck of left femur, initial encounter for closed fracture: Secondary | ICD-10-CM

## 2021-10-09 DIAGNOSIS — S72002D Fracture of unspecified part of neck of left femur, subsequent encounter for closed fracture with routine healing: Secondary | ICD-10-CM

## 2021-10-09 NOTE — Progress Notes (Signed)
Orthopaedic Postop Note  Assessment: [name] is a 87 y.o. female s/p cemented Left hip hemiarthroplasty  DOS: 09/24/2021  Plan: Jodell Cipro removed, steri strips placed Continue using hip abduction pillow while in bed until 6 weeks after surgery Continue posterior hip precautions until 3 months postop Continue with DVT prophylaxis for at least 6 weeks after surgery WBAT on the operative extremity Follow up in 4 weeks; call with any issues  No orders of the defined types were placed in this encounter.    Follow-up: No follow-ups on file. XR at next visit: AP pelvis and Right hip  Subjective:  Chief Complaint  Patient presents with   Hip Pain    L/ DOS 09/24/21  just a lot of soreness. Ready to go home.    History of Present Illness: [name] is a 87 y.o. female who presents following the above stated procedure.  Surgery was approximately 2 weeks ago.  She is still in a facility.  She is complaining of pain in her back.  She is getting tramadol.  No fevers or chills.  She continues to use the abduction pillow in bed.  She is working with PT.   Review of Systems: No fevers or chills No numbness or tingling No Chest Pain No shortness of breath   Objective: There were no vitals taken for this visit.  Physical Exam:  Elderly female.  No acute distress  Surgical incision healing well.  No surrounding erythema or drainage.  She is able to maintain a straight leg raise.  Tolerates gentle range of motion of the hip.  Sensation intact over the dorsum of her foot.  Active motion intact in the EHL/TA.  2+ DP pulse.   IMAGING: I personally ordered and reviewed the following images:  XR of the Left hip and AP pelvis demonstrates a cemented hip arthroplasty in good position.  The hip remains reduced.  There is no evidence of implant subsidence.  No acute fractures are noted.  Impression: Left hip hemiarthroplasty in stable position, without evidence of migration or  subsidence compared to prior XR   Mordecai Rasmussen, MD 10/09/2021 12:21 PM

## 2021-10-10 ENCOUNTER — Encounter: Payer: Self-pay | Admitting: Orthopedic Surgery

## 2021-10-20 ENCOUNTER — Telehealth: Payer: Self-pay | Admitting: Orthopedic Surgery

## 2021-10-20 NOTE — Telephone Encounter (Signed)
Patient's daughter, Rebecka Apley, ph [phone], called to confirm patient's next appointment (relayed, 11/10/21). States not much has changed at [facility]; states patient is up doing rehab a couple hours a day, then goes back to bed. States aids are still changing her in the bed most of the time, rather than walking her to bathroom.  States she and family are very concerned as to how she will be once discharged from the facility, as 'no one will be here at home to do these things for her.'

## 2021-11-06 ENCOUNTER — Encounter: Payer: Medicare Other | Admitting: Orthopedic Surgery

## 2021-11-10 ENCOUNTER — Ambulatory Visit (INDEPENDENT_AMBULATORY_CARE_PROVIDER_SITE_OTHER): Payer: Medicare Other | Admitting: Orthopedic Surgery

## 2021-11-10 ENCOUNTER — Encounter: Payer: Self-pay | Admitting: Orthopedic Surgery

## 2021-11-10 ENCOUNTER — Ambulatory Visit (INDEPENDENT_AMBULATORY_CARE_PROVIDER_SITE_OTHER): Payer: Medicare Other

## 2021-11-10 ENCOUNTER — Telehealth: Payer: Self-pay | Admitting: Orthopedic Surgery

## 2021-11-10 DIAGNOSIS — S72002D Fracture of unspecified part of neck of left femur, subsequent encounter for closed fracture with routine healing: Secondary | ICD-10-CM | POA: Diagnosis not present

## 2021-11-10 DIAGNOSIS — Z96642 Presence of left artificial hip joint: Secondary | ICD-10-CM

## 2021-11-10 NOTE — Telephone Encounter (Signed)
Notified daughter of information given to pt from provider. Verbalized understanding.

## 2021-11-10 NOTE — Telephone Encounter (Signed)
[name], Rebecka Apley [phone] called and is requesting a call from Dr. Amedeo Kinsman assistant before the patient arrives for her appointment this morning.  She stated that she had to work and will be unable to accompany her at her appointment today.

## 2021-11-10 NOTE — Telephone Encounter (Signed)
Called pt daughter around pt appointment time and she just wanted to give information on pt progress. States she doesn't feel pt is able to come home, even with the assistance of home health. Wants mother to come home when it's safe and doesn't think she's ready yet. Also, states mother has been c/o pain but it hasn't seemed to match her complaint areas. Doesn't feel pt should be given any strong narcotics. Would like a call back to discuss what was done during appointment. Let daughter know I will try to give her a call back after morning clinic to discuss.

## 2021-11-10 NOTE — Progress Notes (Signed)
Orthopaedic Postop Note  Assessment: LAPORCHA MARCHESI is a 87 y.o. female s/p cemented Left hip hemiarthroplasty  DOS: 09/24/2021  Plan: Mrs. [place]ot is recovering well.  She denies pain. According to staff in the nursing facility, she is ambulating well. Encouraged her to continue walking, has excellent therapy in order to strengthen her left leg. Recommend the facility discuss discharge with the patient's family, in order to ensure that they have appropriate resources to help her at home. Medications such as Tylenol as needed WBAT on the operative extremity Follow up in 6 weeks; call with any issues   Follow-up: Return in about 6 weeks (around 12/22/2021). XR at next visit: AP pelvis and Right hip  Subjective:  Chief Complaint  Patient presents with   Post-op Follow-up    Hip left/ improving     History of Present Illness: [name] is a 87 y.o. female who presents following the above stated procedure.  Surgery was approximately 6 weeks ago.  She remains admitted to a nursing facility.  She would like to go home.  She is not having any pain.  She is getting Tylenol occasionally.  She continues to work with therapy.  She is ambulating with the assistance of a walker.  According to staff, present in the room, she is doing well walking in the facility.  Review of Systems: No fevers or chills No numbness or tingling No Chest Pain No shortness of breath   Objective: There were no vitals taken for this visit.  Physical Exam:  Elderly female.  No acute distress  Ambulates well with a walker.  Surgical incision healing well.  No surrounding erythema or drainage.  She is able to maintain a straight leg raise.  Tolerates gentle range of motion of the hip.  Sensation intact over the dorsum of her foot.  No pain with axial loading.  Active motion intact in the EHL/TA.  2+ DP pulse.   IMAGING: I personally ordered and reviewed the following images:  XR of the Left hip and AP  pelvis demonstrates a cemented hip arthroplasty in good position.  No change in position.  No periprosthetic lucency.  The hip remains reduced.  There is no evidence of implant subsidence.  No acute fractures are noted.  Impression: Left hip hemiarthroplasty in stable position without hardware failure or loosening   Mordecai Rasmussen, MD 11/10/2021 4:53 PM

## 2021-11-23 DIAGNOSIS — D649 Anemia, unspecified: Secondary | ICD-10-CM | POA: Insufficient documentation

## 2021-11-23 DIAGNOSIS — Z7951 Long term (current) use of inhaled steroids: Secondary | ICD-10-CM | POA: Insufficient documentation

## 2021-11-23 DIAGNOSIS — F0393 Unspecified dementia, unspecified severity, with mood disturbance: Secondary | ICD-10-CM | POA: Insufficient documentation

## 2021-12-22 ENCOUNTER — Ambulatory Visit (INDEPENDENT_AMBULATORY_CARE_PROVIDER_SITE_OTHER): Payer: Medicare Other

## 2021-12-22 ENCOUNTER — Encounter: Payer: Self-pay | Admitting: Orthopedic Surgery

## 2021-12-22 ENCOUNTER — Ambulatory Visit: Payer: Self-pay

## 2021-12-22 ENCOUNTER — Ambulatory Visit (INDEPENDENT_AMBULATORY_CARE_PROVIDER_SITE_OTHER): Payer: Medicare Other | Admitting: Orthopedic Surgery

## 2021-12-22 DIAGNOSIS — S72002D Fracture of unspecified part of neck of left femur, subsequent encounter for closed fracture with routine healing: Secondary | ICD-10-CM | POA: Diagnosis not present

## 2021-12-22 DIAGNOSIS — Z96642 Presence of left artificial hip joint: Secondary | ICD-10-CM

## 2021-12-22 NOTE — Progress Notes (Unsigned)
Orthopaedic Postop Note  Assessment: [name] is a 87 y.o. female s/p cemented Left hip hemiarthroplasty  DOS: 09/24/2021  Plan: [name] is recovering well.  She denies pain. According to staff in the nursing facility, she is ambulating well. Encouraged her to continue walking, has excellent therapy in order to strengthen her left leg. Recommend the facility discuss discharge with the patient's family, in order to ensure that they have appropriate resources to help her at home. Medications such as Tylenol as needed WBAT on the operative extremity Follow up in 6 weeks; call with any issues   Follow-up: Return in about 3 months (around 03/23/2022). XR at next visit: AP pelvis and Right hip  Subjective:  Chief Complaint  Patient presents with   Post-op Follow-up    09/24/21 improving     History of Present Illness: [name] is a 87 y.o. female who presents following the above stated procedure.  Surgery was approximately 6 weeks ago.  She remains admitted to a nursing facility.  She would like to go home.  She is not having any pain.  She is getting Tylenol occasionally.  She continues to work with therapy.  She is ambulating with the assistance of a walker.  According to staff, present in the room, she is doing well walking in the facility.  Review of Systems: No fevers or chills No numbness or tingling No Chest Pain No shortness of breath   Objective: There were no vitals taken for this visit.  Physical Exam:  Elderly female.  No acute distress  Ambulates well with a walker.  Surgical incision healing well.  No surrounding erythema or drainage.  She is able to maintain a straight leg raise.  Tolerates gentle range of motion of the hip.  Sensation intact over the dorsum of her foot.  No pain with axial loading.  Active motion intact in the EHL/TA.  2+ DP pulse.   IMAGING: I personally ordered and reviewed the following images:  XR of the Left hip and AP  pelvis demonstrates a cemented hip arthroplasty in good position.  No change in position.  No periprosthetic lucency.  The hip remains reduced.  There is no evidence of implant subsidence.  No acute fractures are noted.  Impression: Left hip hemiarthroplasty in stable position without hardware failure or loosening   Mordecai Rasmussen, MD 12/22/2021 4:13 PM

## 2022-01-07 ENCOUNTER — Ambulatory Visit (INDEPENDENT_AMBULATORY_CARE_PROVIDER_SITE_OTHER): Payer: Medicare Other | Admitting: Podiatry

## 2022-01-07 VITALS — BP 112/64

## 2022-01-07 DIAGNOSIS — L97522 Non-pressure chronic ulcer of other part of left foot with fat layer exposed: Secondary | ICD-10-CM | POA: Diagnosis not present

## 2022-01-11 ENCOUNTER — Ambulatory Visit: Payer: Medicare Other | Admitting: Podiatry

## 2022-01-12 ENCOUNTER — Telehealth: Payer: Self-pay | Admitting: Podiatry

## 2022-01-12 MED ORDER — MUPIROCIN 2 % EX OINT: 1.0000 | TOPICAL_OINTMENT | Freq: Every day | CUTANEOUS | 2 refills | Status: DC

## 2022-01-12 NOTE — Telephone Encounter (Signed)
Patient's daughter called answering service thinks Betadine is causing irritation of the skin turning red.  Advised to discontinue and sent Rx for mupirocin advised to apply small amount of this and continue to bandage as planned

## 2022-01-14 NOTE — Progress Notes (Signed)
Subjective:  Patient ID: [name], female    DOB: September 08, 1934,  MRN: [id number]  No chief complaint on file.   88 y.o. female presents with the above complaint.  Patient presents with left first metatarsophalangeal joint ulceration pain for touch.  Just came out of nowhere when to get it evaluated on the side of the toe.  Hurts with ambulation worse with pressure.  She has not done anything for it she is not a diabetic   Review of Systems: Negative except as noted in the HPI. Denies N/V/F/Ch.  Past Medical History:  Diagnosis Date   Anxiety    Chronic diastolic (congestive) heart failure (HCC)    Chronic obstructive pulmonary disease, unspecified (HCC)    COPD (chronic obstructive pulmonary disease) (HCC)    GERD (gastroesophageal reflux disease)    Hyperlipidemia    IBS (irritable bowel syndrome)    Lumbago with sciatica    Osteoporosis    Overactive bladder    Pneumonia     Current Outpatient Medications:    albuterol (PROAIR HFA) 108 (90 Base) MCG/ACT inhaler, Inhale 2 puffs into the lungs every 4 (four) hours as needed for wheezing or shortness of breath. (Patient taking differently: Inhale 2 puffs into the lungs every 6 (six) hours as needed for wheezing or shortness of breath.), Disp: 8 g, Rfl: 2   albuterol (PROVENTIL) (2.5 MG/3ML) 0.083% nebulizer solution, Take 3 mLs (2.5 mg total) by nebulization every 6 (six) hours as needed for wheezing or shortness of breath. (Patient taking differently: Take 2.5 mg by nebulization in the morning, at noon, in the evening, and at bedtime.), Disp: 75 mL, Rfl: 0   ALPRAZolam (XANAX) 0.5 MG tablet, Take 1 tablet (0.5 mg total) by mouth 2 (two) times daily., Disp: 10 tablet, Rfl: 0   diclofenac Sodium (VOLTAREN) 1 % GEL, APPLY AS DIRECTED TO THE AFFECTED KNEE FOUR TIMES DAILY. (Patient taking differently: Apply 4 g topically 4 (four) times daily.), Disp: 500 g, Rfl: 11   donepezil (ARICEPT) 10 MG tablet, Take 10 mg by mouth daily., Disp: ,  Rfl:    ferrous sulfate 325 (65 FE) MG tablet, Take 1 tablet (325 mg total) by mouth daily., Disp: 30 tablet, Rfl: 3   furosemide (LASIX) 40 MG tablet, Take 40 mg by mouth daily., Disp: , Rfl:    ipratropium (ATROVENT) 0.02 % nebulizer solution, Take 2.5 mLs (0.5 mg total) by nebulization every 6 (six) hours as needed for wheezing or shortness of breath., Disp: 75 mL, Rfl: 12   LINZESS 145 MCG CAPS capsule, Take 145 mcg by mouth daily as needed (bowel movement)., Disp: , Rfl:    loratadine (CLARITIN) 10 MG tablet, Take 10 mg by mouth daily., Disp: , Rfl:    memantine (NAMENDA) 10 MG tablet, Take 10 mg by mouth 2 (two) times daily., Disp: , Rfl:    Multiple Vitamin (MULTIVITAMIN WITH MINERALS) TABS tablet, Take 1 tablet by mouth daily., Disp: , Rfl:    mupirocin ointment (BACTROBAN) 2 %, Apply 1 Application topically daily., Disp: 30 g, Rfl: 2   pantoprazole (PROTONIX) 40 MG tablet, TAKE (1) TABLET BY MOUTH TWICE A DAY WITH MEALS (BREAKFAST AND SUPPER) (Patient taking differently: Take 40 mg by mouth daily.), Disp: 60 tablet, Rfl: 11   polyethylene glycol (MIRALAX / GLYCOLAX) 17 g packet, Take 17 g by mouth daily., Disp: 14 each, Rfl: 0   potassium chloride (K-DUR) 10 MEQ tablet, Take 1 tablet (10 mEq total) by mouth daily., Disp:  30 tablet, Rfl: 0   senna-docusate (SENOKOT-S) 8.6-50 MG tablet, Take 1 tablet by mouth 2 (two) times daily., Disp: , Rfl:    simvastatin (ZOCOR) 10 MG tablet, Take 5 mg by mouth daily., Disp: , Rfl:    traMADol (ULTRAM) 50 MG tablet, Take 1 tablet (50 mg total) by mouth every 8 (eight) hours as needed for moderate pain., Disp: 10 tablet, Rfl: 0   umeclidinium-vilanterol (ANORO ELLIPTA) 62.5-25 MCG/INH AEPB, Inhale 1 puff into the lungs daily., Disp: 74 each, Rfl: 0  Social History   Tobacco Use  Smoking Status Former   Types: Cigarettes   Passive exposure: Yes  Smokeless Tobacco Current   Types: Snuff    Allergies  Allergen Reactions   Codeine Shortness Of  Breath and Rash   Codeine Anaphylaxis   Levofloxacin Anaphylaxis    Patient was admitted to hospital with lung problems due to this medication   Sulfonamide Derivatives Hives and Shortness Of Breath   Sulfa Antibiotics Hives   Objective:   Vitals:   01/07/22 1417  BP: 112/64   There is no height or weight on file to calculate BMI. Constitutional Well developed. Well nourished.  Vascular Dorsalis pedis pulses palpable bilaterally. Posterior tibial pulses palpable bilaterally. Capillary refill normal to all digits.  No cyanosis or clubbing noted. Pedal hair growth normal.  Neurologic Normal speech. Oriented to person, place, and time. Epicritic sensation to light touch grossly present bilaterally.  Dermatologic Left first MPJ ulceration fat layer exposed.  No does not probe down to bone no purulent drainage noted mild no erythema noted.  Orthopedic: Normal joint ROM without pain or crepitus bilaterally. No visible deformities. No bony tenderness.   Radiographs: None Assessment:   1. Chronic foot ulcer with fat layer exposed, left ([place])    Plan:  Patient was evaluated and treated and all questions answered.  Left first metatarsophalangeal joint ulceration limited to the breakdown of the skin -Minimal debridement was carried out -I believe patient will benefit from aggressive drying of the ulceration with Betadine wet-to-dry.  I have asked her to apply twice a day she states understanding. -Surgical shoe was dispensed. -She is a high risk of worsening ulceration and possible amputation.  Will continue to clinically monitor  No follow-ups on file.

## 2022-01-28 ENCOUNTER — Telehealth: Payer: Self-pay | Admitting: *Deleted

## 2022-01-28 NOTE — Telephone Encounter (Signed)
Patient's daughter is calling because her toe seems to be still infected,not healing,still hurting,mupirocin ointment, stopped the betadine, notice what she believes is pus, no odor, red around the area,hole is not closing up, can something else be prescribed until she comes in on Tuesday,please advise

## 2022-01-29 ENCOUNTER — Other Ambulatory Visit: Payer: Self-pay | Admitting: Podiatry

## 2022-01-29 MED ORDER — DOXYCYCLINE HYCLATE 100 MG PO TABS: 100.0000 mg | ORAL_TABLET | Freq: Two times a day (BID) | ORAL | 0 refills | Status: DC

## 2022-01-31 ENCOUNTER — Ambulatory Visit
Admission: EM | Admit: 2022-01-31 | Discharge: 2022-01-31 | Disposition: A | Discharge status: Home / Self Care | Payer: Medicare Other

## 2022-01-31 DIAGNOSIS — T7840XA Allergy, unspecified, initial encounter: Secondary | ICD-10-CM

## 2022-01-31 DIAGNOSIS — L509 Urticaria, unspecified: Secondary | ICD-10-CM | POA: Diagnosis not present

## 2022-01-31 HISTORY — DX: Pulmonary fibrosis, unspecified: J84.10

## 2022-01-31 MED ORDER — METHYLPREDNISOLONE SODIUM SUCC 125 MG IJ SOLR
80.0000 mg | Freq: Once | INTRAMUSCULAR | Status: AC
  Administered 2022-01-31: 80 mg via INTRAMUSCULAR

## 2022-01-31 NOTE — ED Provider Notes (Signed)
[facility]    CSN: [id number] Arrival date & time: 01/31/22  1135      History   Chief Complaint No chief complaint on file.   HPI [name] is a 88 y.o. female.   Patient presenting today with 1 day history of red itchy hives rash across most of body that started shortly after initiating a course of doxycycline yesterday.  States the podiatrist called in doxycycline for a nonhealing foot ulcer and after 1-2 doses of this family members noticed the rash, itching coming on.  It has worsened this morning despite Benadryl.  Denies difficulty breathing or swallowing, nausea, vomiting, diaphoresis, chest pain.    Past Medical History:  Diagnosis Date   Anxiety    Chronic diastolic (congestive) heart failure (HCC)    Chronic obstructive pulmonary disease, unspecified (HCC)    COPD (chronic obstructive pulmonary disease) (HCC)    GERD (gastroesophageal reflux disease)    Hyperlipidemia    IBS (irritable bowel syndrome)    Lumbago with sciatica    Osteoporosis    Overactive bladder    Pneumonia    Pulmonary fibrosis ([place])     Patient Active Problem List   Diagnosis Date Noted   Anemia, unspecified 11/23/2021   Long term (current) use of inhaled steroids 11/23/2021   Unspecified dementia, unspecified severity, with mood disturbance ([place]) 11/23/2021   Fracture of femoral neck, left, closed ([place]) 09/24/2021   Acute respiratory failure with hypoxia ([place]) 06/10/2021   Tobacco use disorder 06/10/2021   Generalized weakness 06/10/2021   Bilateral conjunctivitis 06/10/2021   Chronic diastolic CHF (congestive heart failure) ([place]) 05/14/2021   Depression 05/14/2021   Hyperlipidemia    COPD exacerbation ([place]) 05/13/2021   Long term (current) use of opiate analgesic 01/04/2021   Chronic pain 04/23/2019   General unsteadiness 04/23/2019   Polyarthropathy 04/23/2019   Pseudobulbar affect 04/23/2019   Tremor 04/23/2019   Chronic obstructive pulmonary disease,  unspecified (HCC)    Gastro-esophageal reflux disease without esophagitis    Hyperlipidemia, unspecified    Pulmonary fibrosis, unspecified (HCC)    Acute on chronic diastolic (congestive) heart failure (HCC)    Age-related osteoporosis without current pathological fracture    Anxiety disorder, unspecified    Chest pain, unspecified    Chronic diastolic (congestive) heart failure (HCC)    Collapsed vertebra, not elsewhere classified, site unspecified, subsequent encounter for fracture with routine healing    Edema, unspecified    Lumbago with sciatica    Overactive bladder    Pain in unspecified hand    Pressure ulcer of unspecified site, unspecified stage    Unspecified protein-calorie malnutrition ([place])    Acute diastolic heart failure ([place]) 09/30/2018   Dyspnea 09/29/2018   Pneumonia 06/30/2018   Acute CHF (congestive heart failure) ([place]) 06/29/2018   Vascular dementia without behavioral disturbance ([place]) 06/26/2018   CAP (community acquired pneumonia) 06/23/2018   Bilateral pleural effusion 06/23/2018   Leukocytosis 06/20/2018   Chronic anemia 06/20/2018   Dementia ([place]) 06/19/2018   Bilateral lower extremity edema 06/18/2018   Protein-calorie malnutrition, severe ([place]) 06/18/2018   S/P internal fixation right hip fracture cannulated screw placement 04/06/18 04/27/2018   Urinary tract infection associated with catheterization of urinary tract ([place]) 04/11/2018   Altered mental status 04/10/2018   Anxiety 04/10/2018   Hyponatremia 04/10/2018   UTI (urinary tract infection) due to urinary indwelling catheter ([place]) 04/10/2018   Chronic constipation 04/09/2018   Urinary retention 04/09/2018   Closed right hip fracture ([place])  04/05/2018   GERD (gastroesophageal reflux disease) 04/05/2018   COPD with acute exacerbation ([place]) 04/05/2018   Hiatal hernia    Dyspepsia 04/04/2014   Hematochezia 04/30/2011   RUQ pain 03/25/2011   Adenomatous polyps 03/25/2011   ABDOMINAL PAIN, LEFT  LOWER QUADRANT 10/16/2009   HYPERLIPIDEMIA 10/09/2009   BRONCHITIS 10/09/2009   Osteoporosis 10/09/2009   Personal history of pneumonia (recurrent) 01/05/1999   Mixed hyperlipidemia 01/05/1999    Past Surgical History:  Procedure Laterality Date   COLONOSCOPY  10/2009   sigmoid diverticula, multiple tubulovillous adneomas, needs surveillance Oct 2013   ESOPHAGOGASTRODUODENOSCOPY  04/16/2011   Dr. Gala Romney: Noncritical Schatzkis ring( not manipulated because no dysphagia). Normal esophagus otherwise  large hiatal hernia. Gastric polyp-status post biopsy. Gastric erosions-staus post biopsy. Abnormal bulb-status post biopsy. benign small bowel, stomach biopsy with ulcerated gastric antral mucosa with foveolar hyperplasia and surface erosion, polyp with inflamed gastric antral mucosa    ESOPHAGOGASTRODUODENOSCOPY N/A 04/29/2014   Dr. Gala Romney: Noncritical Schatzki's ring large hiatal hernia. Retained gastric contents.GES with slight delay   HIP ARTHROPLASTY Left 09/24/2021   Procedure: ARTHROPLASTY BIPOLAR HIP (HEMIARTHROPLASTY);  Surgeon: Mordecai Rasmussen, MD;  Location: [facility];  Service: Orthopedics;  Laterality: Left;   HIP PINNING,CANNULATED Right 04/06/2018   Procedure: CANNULATED HIP PINNING;  Surgeon: Carole Civil, MD;  Location: [facility];  Service: Orthopedics;  Laterality: Right;    OB History     Gravida  3   Para  3   Term  3   Preterm  0   AB  0   Living         SAB  0   IAB  0   Ectopic  0   Multiple      Live Births               Home Medications    Prior to Admission medications   Medication Sig Start Date End Date Taking? Authorizing Provider  doxycycline (VIBRA-TABS) 100 MG tablet Take 1 tablet (100 mg total) by mouth 2 (two) times daily. 01/29/22   Felipa Furnace, DPM  zolpidem (AMBIEN) 5 MG tablet Take 5 mg by mouth at bedtime as needed for sleep.   Yes [provider]  albuterol (PROAIR HFA) 108 (90 Base) MCG/ACT inhaler Inhale 2 puffs into  the lungs every 4 (four) hours as needed for wheezing or shortness of breath. Patient taking differently: Inhale 2 puffs into the lungs every 6 (six) hours as needed for wheezing or shortness of breath. 06/12/21   Johnson, Clanford L, MD  albuterol (PROVENTIL) (2.5 MG/3ML) 0.083% nebulizer solution Take 3 mLs (2.5 mg total) by nebulization every 6 (six) hours as needed for wheezing or shortness of breath. Patient taking differently: Take 2.5 mg by nebulization in the morning, at noon, in the evening, and at bedtime. 06/23/18   Gerlene Fee, NP  ALPRAZolam Duanne Moron) 0.5 MG tablet Take 1 tablet (0.5 mg total) by mouth 2 (two) times daily. 09/30/21   Shelly Coss, MD  diclofenac Sodium (VOLTAREN) 1 % GEL APPLY AS DIRECTED TO THE AFFECTED KNEE FOUR TIMES DAILY. Patient taking differently: Apply 4 g topically 4 (four) times daily. 08/04/20   Carole Civil, MD  donepezil (ARICEPT) 10 MG tablet Take 10 mg by mouth daily. 07/25/19   [provider]  ferrous sulfate 325 (65 FE) MG tablet Take 1 tablet (325 mg total) by mouth daily. 09/30/21 09/30/22  Shelly Coss, MD  furosemide (LASIX) 40 MG tablet  Take 40 mg by mouth daily.    [provider]  ipratropium (ATROVENT) 0.02 % nebulizer solution Take 2.5 mLs (0.5 mg total) by nebulization every 6 (six) hours as needed for wheezing or shortness of breath. 09/30/21   Shelly Coss, MD  LINZESS 145 MCG CAPS capsule Take 145 mcg by mouth daily as needed (bowel movement). 05/22/21   [provider]  loratadine (CLARITIN) 10 MG tablet Take 10 mg by mouth daily. 08/29/21   [provider]  memantine (NAMENDA) 10 MG tablet Take 10 mg by mouth 2 (two) times daily.    [provider]  Multiple Vitamin (MULTIVITAMIN WITH MINERALS) TABS tablet Take 1 tablet by mouth daily.    [provider]  mupirocin ointment (BACTROBAN) 2 % Apply 1 Application topically daily. 01/12/22   McDonald, Stephan Minister, DPM  pantoprazole  (PROTONIX) 40 MG tablet TAKE (1) TABLET BY MOUTH TWICE A DAY WITH MEALS (BREAKFAST AND SUPPER) Patient taking differently: Take 40 mg by mouth daily. 05/24/19   Carlis Stable, NP  polyethylene glycol (MIRALAX / GLYCOLAX) 17 g packet Take 17 g by mouth daily. 09/30/21   Shelly Coss, MD  potassium chloride (K-DUR) 10 MEQ tablet Take 1 tablet (10 mEq total) by mouth daily. 06/23/18   Gerlene Fee, NP  senna-docusate (SENOKOT-S) 8.6-50 MG tablet Take 1 tablet by mouth 2 (two) times daily. 09/30/21   Shelly Coss, MD  simvastatin (ZOCOR) 10 MG tablet Take 5 mg by mouth daily. 02/27/21   [provider]  traMADol (ULTRAM) 50 MG tablet Take 1 tablet (50 mg total) by mouth every 8 (eight) hours as needed for moderate pain. 09/30/21   Shelly Coss, MD  umeclidinium-vilanterol (ANORO ELLIPTA) 62.5-25 MCG/INH AEPB Inhale 1 puff into the lungs daily. 06/23/18   Gerlene Fee, NP    Family History Family History  Problem Relation Age of Onset   Stroke Mother    Diabetes Mother    Heart disease Father    Colon cancer Neg Hx     Social History Social History   Tobacco Use   Smoking status: Former    Types: Cigarettes    Passive exposure: Yes   Smokeless tobacco: Current    Types: Snuff  Vaping Use   Vaping Use: Never used  Substance Use Topics   Alcohol use: No   Drug use: No     Allergies   Codeine, Codeine, Levofloxacin, Sulfonamide derivatives, Doxycycline, Septra [sulfamethoxazole-trimethoprim], Sulfa antibiotics, and Trimethoprim   Review of Systems Review of Systems Per HPI  Physical Exam Triage Vital Signs ED Triage Vitals  Enc Vitals Group     BP 01/31/22 1312 (!) 161/92     Pulse Rate 01/31/22 1312 78     Resp 01/31/22 1312 20     Temp 01/31/22 1312 97.7 F (36.5 C)     Temp Source 01/31/22 1312 Oral     SpO2 01/31/22 1312 96 %     Weight --      Height --      Head Circumference --      Peak Flow --      Pain Score 01/31/22 1318 0     Pain Loc  --      Pain Edu? --      Excl. in [place]? --    No data found.  Updated Vital Signs BP (!) 161/92 (BP Location: Right Arm)   Pulse 78   Temp 97.7 F (36.5 C) (Oral)  Resp 20   SpO2 96%   Visual Acuity Right Eye Distance:   Left Eye Distance:   Bilateral Distance:    Right Eye Near:   Left Eye Near:    Bilateral Near:     Physical Exam Vitals and nursing note reviewed.  Constitutional:      Appearance: Normal appearance. She is not ill-appearing.  HENT:     Head: Atraumatic.     Mouth/Throat:     Mouth: Mucous membranes are moist.     Pharynx: Oropharynx is clear. No posterior oropharyngeal erythema.  Eyes:     Extraocular Movements: Extraocular movements intact.     Conjunctiva/sclera: Conjunctivae normal.  Cardiovascular:     Rate and Rhythm: Normal rate and regular rhythm.     Heart sounds: Normal heart sounds.  Pulmonary:     Effort: Pulmonary effort is normal.     Breath sounds: Normal breath sounds. No wheezing or rales.  Musculoskeletal:        General: Normal range of motion.     Cervical back: Normal range of motion and neck supple.  Skin:    General: Skin is warm and dry.     Findings: Rash present.     Comments: Erythematous hives rash diffusely across extremities, trunk  Neurological:     Mental Status: She is alert and oriented to person, place, and time.  Psychiatric:        Mood and Affect: Mood normal.        Thought Content: Thought content normal.        Judgment: Judgment normal.      UC Treatments / Results  Labs (all labs ordered are listed, but only abnormal results are displayed) Labs Reviewed - No data to display  EKG   Radiology No results found.  Procedures Procedures (including critical care time)  Medications Ordered in UC Medications  methylPREDNISolone sodium succinate (SOLU-MEDROL) 125 mg/2 mL injection 80 mg (80 mg Intramuscular Given 01/31/22 1406)    Initial Impression / Assessment and Plan / UC Course  I have  reviewed the triage vital signs and the nursing notes.  Pertinent labs & imaging results that were available during my care of the patient were reviewed by me and considered in my medical decision making (see chart for details).     Suspect allergic reaction to doxycycline, this was added to her allergy list and will treat with IM Solu-Medrol, antihistamines and close follow-up with prescribing provider and ED for progressively worsening symptoms  Final Clinical Impressions(s) / UC Diagnoses   Final diagnoses:  Hives  Allergic reaction, initial encounter   Discharge Instructions   None    ED Prescriptions   None    PDMP not reviewed this encounter.   Volney American, [place] 01/31/22 1510

## 2022-01-31 NOTE — ED Triage Notes (Signed)
Pt is having some red spots on the back of her legs and buttocks x 1 day. she was some wheezing as well. Pts daughter is saying she started taking doxycycline yesterday and is now having a reaction.   Denies pain but has some irritation to the sites

## 2022-02-02 ENCOUNTER — Ambulatory Visit (INDEPENDENT_AMBULATORY_CARE_PROVIDER_SITE_OTHER): Payer: Medicare Other | Admitting: Podiatry

## 2022-02-02 DIAGNOSIS — L97522 Non-pressure chronic ulcer of other part of left foot with fat layer exposed: Secondary | ICD-10-CM | POA: Diagnosis not present

## 2022-02-02 MED ORDER — METHYLPREDNISOLONE 4 MG PO TBPK: ORAL_TABLET | ORAL | 0 refills | Status: DC

## 2022-02-02 MED ORDER — SANTYL 250 UNIT/GM EX OINT: 1.0000 | TOPICAL_OINTMENT | Freq: Every day | CUTANEOUS | 0 refills | Status: DC

## 2022-02-02 NOTE — Progress Notes (Signed)
Subjective:  Patient ID: [name], female    DOB: 1934-08-11,  MRN: [id number]  No chief complaint on file.   88 y.o. female presents with the above complaint.  Patient presents with left first metatarsophalangeal joint ulceration pain for touch.  Patient states that the ulceration is about the same.  She was allergic to Betadine as well as doxycycline.  Denies any other acute complaints.   Review of Systems: Negative except as noted in the HPI. Denies N/V/F/Ch.  Past Medical History:  Diagnosis Date   Anxiety    Chronic diastolic (congestive) heart failure (HCC)    Chronic obstructive pulmonary disease, unspecified (HCC)    COPD (chronic obstructive pulmonary disease) (HCC)    GERD (gastroesophageal reflux disease)    Hyperlipidemia    IBS (irritable bowel syndrome)    Lumbago with sciatica    Osteoporosis    Overactive bladder    Pneumonia     Current Outpatient Medications:    albuterol (PROAIR HFA) 108 (90 Base) MCG/ACT inhaler, Inhale 2 puffs into the lungs every 4 (four) hours as needed for wheezing or shortness of breath. (Patient taking differently: Inhale 2 puffs into the lungs every 6 (six) hours as needed for wheezing or shortness of breath.), Disp: 8 g, Rfl: 2   albuterol (PROVENTIL) (2.5 MG/3ML) 0.083% nebulizer solution, Take 3 mLs (2.5 mg total) by nebulization every 6 (six) hours as needed for wheezing or shortness of breath. (Patient taking differently: Take 2.5 mg by nebulization in the morning, at noon, in the evening, and at bedtime.), Disp: 75 mL, Rfl: 0   ALPRAZolam (XANAX) 0.5 MG tablet, Take 1 tablet (0.5 mg total) by mouth 2 (two) times daily., Disp: 10 tablet, Rfl: 0   diclofenac Sodium (VOLTAREN) 1 % GEL, APPLY AS DIRECTED TO THE AFFECTED KNEE FOUR TIMES DAILY. (Patient taking differently: Apply 4 g topically 4 (four) times daily.), Disp: 500 g, Rfl: 11   donepezil (ARICEPT) 10 MG tablet, Take 10 mg by mouth daily., Disp: , Rfl:    ferrous sulfate 325  (65 FE) MG tablet, Take 1 tablet (325 mg total) by mouth daily., Disp: 30 tablet, Rfl: 3   furosemide (LASIX) 40 MG tablet, Take 40 mg by mouth daily., Disp: , Rfl:    ipratropium (ATROVENT) 0.02 % nebulizer solution, Take 2.5 mLs (0.5 mg total) by nebulization every 6 (six) hours as needed for wheezing or shortness of breath., Disp: 75 mL, Rfl: 12   LINZESS 145 MCG CAPS capsule, Take 145 mcg by mouth daily as needed (bowel movement)., Disp: , Rfl:    loratadine (CLARITIN) 10 MG tablet, Take 10 mg by mouth daily., Disp: , Rfl:    memantine (NAMENDA) 10 MG tablet, Take 10 mg by mouth 2 (two) times daily., Disp: , Rfl:    Multiple Vitamin (MULTIVITAMIN WITH MINERALS) TABS tablet, Take 1 tablet by mouth daily., Disp: , Rfl:    mupirocin ointment (BACTROBAN) 2 %, Apply 1 Application topically daily., Disp: 30 g, Rfl: 2   pantoprazole (PROTONIX) 40 MG tablet, TAKE (1) TABLET BY MOUTH TWICE A DAY WITH MEALS (BREAKFAST AND SUPPER) (Patient taking differently: Take 40 mg by mouth daily.), Disp: 60 tablet, Rfl: 11   polyethylene glycol (MIRALAX / GLYCOLAX) 17 g packet, Take 17 g by mouth daily., Disp: 14 each, Rfl: 0   potassium chloride (K-DUR) 10 MEQ tablet, Take 1 tablet (10 mEq total) by mouth daily., Disp: 30 tablet, Rfl: 0   senna-docusate (SENOKOT-S) 8.6-50 MG tablet,  Take 1 tablet by mouth 2 (two) times daily., Disp: , Rfl:    simvastatin (ZOCOR) 10 MG tablet, Take 5 mg by mouth daily., Disp: , Rfl:    traMADol (ULTRAM) 50 MG tablet, Take 1 tablet (50 mg total) by mouth every 8 (eight) hours as needed for moderate pain., Disp: 10 tablet, Rfl: 0   umeclidinium-vilanterol (ANORO ELLIPTA) 62.5-25 MCG/INH AEPB, Inhale 1 puff into the lungs daily., Disp: 83 each, Rfl: 0  Social History   Tobacco Use  Smoking Status Former   Types: Cigarettes   Passive exposure: Yes  Smokeless Tobacco Current   Types: Snuff    Allergies  Allergen Reactions   Codeine Shortness Of Breath and Rash   Codeine  Anaphylaxis   Levofloxacin Anaphylaxis    Patient was admitted to hospital with lung problems due to this medication   Sulfonamide Derivatives Hives and Shortness Of Breath   Sulfa Antibiotics Hives   Objective:   Vitals:   01/07/22 1417  BP: 112/64   There is no height or weight on file to calculate BMI. Constitutional Well developed. Well nourished.  Vascular Dorsalis pedis pulses palpable bilaterally. Posterior tibial pulses palpable bilaterally. Capillary refill normal to all digits.  No cyanosis or clubbing noted. Pedal hair growth normal.  Neurologic Normal speech. Oriented to person, place, and time. Epicritic sensation to light touch grossly present bilaterally.  Dermatologic Left first MPJ ulceration fat layer exposed.  No does not probe down to bone no purulent drainage noted mild no erythema noted.  Orthopedic: Normal joint ROM without pain or crepitus bilaterally. No visible deformities. No bony tenderness.   Radiographs: None Assessment:   1. Chronic foot ulcer with fat layer exposed, left ([place])    Plan:  Patient was evaluated and treated and all questions answered.  Left first metatarsophalangeal joint ulceration limited to the breakdown of the skin -Minimal debridement was carried out -She was not able to tolerate Betadine wet-to-dry dressing.  She is also allergic to doxycycline.  At this time she will benefit from [facility] wet-to-dry dressing and a referral to the wound care center -Referral was placed to the wound care center. -She is a high risk of worsening ulceration and possible amputation.  Will continue to clinically monitor  No follow-ups on file.

## 2022-02-03 NOTE — Telephone Encounter (Signed)
[facility], patient has been scheduled 02/22/22  '@1'$ :30.

## 2022-02-03 NOTE — Telephone Encounter (Signed)
-----  Message from Felipa Furnace, DPM sent at 02/02/2022  1:36 PM EST ----- Regarding: Referral to the wound care center Hi Mally Gavina,   Can you refer this patient to the wound care center?  [place]

## 2022-02-22 ENCOUNTER — Encounter: Payer: Medicare Other | Attending: Physician Assistant | Admitting: Physician Assistant

## 2022-02-22 DIAGNOSIS — F01511 Vascular dementia, unspecified severity, with agitation: Secondary | ICD-10-CM | POA: Insufficient documentation

## 2022-02-22 DIAGNOSIS — I5042 Chronic combined systolic (congestive) and diastolic (congestive) heart failure: Secondary | ICD-10-CM | POA: Insufficient documentation

## 2022-02-22 DIAGNOSIS — J449 Chronic obstructive pulmonary disease, unspecified: Secondary | ICD-10-CM | POA: Insufficient documentation

## 2022-02-22 DIAGNOSIS — L89893 Pressure ulcer of other site, stage 3: Secondary | ICD-10-CM | POA: Diagnosis present

## 2022-02-26 ENCOUNTER — Ambulatory Visit: Payer: Medicare Other | Admitting: Podiatry

## 2022-02-28 ENCOUNTER — Emergency Department (HOSPITAL_COMMUNITY): Payer: Medicare Other

## 2022-02-28 ENCOUNTER — Other Ambulatory Visit: Payer: Self-pay

## 2022-02-28 ENCOUNTER — Emergency Department (HOSPITAL_COMMUNITY)
Admission: EM | Admit: 2022-02-28 | Discharge: 2022-02-28 | Disposition: A | Discharge status: Home / Self Care | Payer: Medicare Other | Attending: Emergency Medicine | Admitting: Emergency Medicine

## 2022-02-28 DIAGNOSIS — J441 Chronic obstructive pulmonary disease with (acute) exacerbation: Secondary | ICD-10-CM | POA: Diagnosis not present

## 2022-02-28 DIAGNOSIS — Z1152 Encounter for screening for COVID-19: Secondary | ICD-10-CM | POA: Insufficient documentation

## 2022-02-28 DIAGNOSIS — F0390 Unspecified dementia without behavioral disturbance: Secondary | ICD-10-CM | POA: Insufficient documentation

## 2022-02-28 DIAGNOSIS — I5032 Chronic diastolic (congestive) heart failure: Secondary | ICD-10-CM | POA: Insufficient documentation

## 2022-02-28 DIAGNOSIS — R0602 Shortness of breath: Secondary | ICD-10-CM | POA: Diagnosis present

## 2022-02-28 LAB — BASIC METABOLIC PANEL
Anion gap: 13 (ref 5–15)
BUN: 25 mg/dL — ABNORMAL HIGH (ref 8–23)
CO2: 24 mmol/L (ref 22–32)
Calcium: 9.3 mg/dL (ref 8.9–10.3)
Chloride: 99 mmol/L (ref 98–111)
Creatinine, Ser: 1.23 mg/dL — ABNORMAL HIGH (ref 0.44–1.00)
GFR, Estimated: 43 mL/min — ABNORMAL LOW (ref >60)
Glucose, Bld: 136 mg/dL — ABNORMAL HIGH (ref 70–99)
Potassium: 4.4 mmol/L (ref 3.5–5.1)
Sodium: 136 mmol/L (ref 135–145)

## 2022-02-28 LAB — CBC
HCT: 34.1 % — ABNORMAL LOW (ref 36.0–46.0)
Hemoglobin: 10.5 g/dL — ABNORMAL LOW (ref 12.0–15.0)
MCH: 28.1 pg (ref 26.0–34.0)
MCHC: 30.8 g/dL (ref 30.0–36.0)
MCV: 91.2 fL (ref 80.0–100.0)
Platelets: 215 10*3/uL (ref 150–400)
RBC: 3.74 MIL/uL — ABNORMAL LOW (ref 3.87–5.11)
RDW: 14.6 % (ref 11.5–15.5)
WBC: 6.3 10*3/uL (ref 4.0–10.5)
nRBC: 0 % (ref 0.0–0.2)

## 2022-02-28 LAB — RESP PANEL BY RT-PCR (RSV, FLU A&B, COVID)  RVPGX2
Influenza A by PCR: NEGATIVE
Influenza B by PCR: NEGATIVE
Resp Syncytial Virus by PCR: NEGATIVE
SARS Coronavirus 2 by RT PCR: NEGATIVE

## 2022-02-28 LAB — BRAIN NATRIURETIC PEPTIDE: B Natriuretic Peptide: 90 pg/mL (ref 0.0–100.0)

## 2022-02-28 MED ORDER — PREDNISONE 20 MG PO TABS: 40.0000 mg | ORAL_TABLET | Freq: Every day | ORAL | 0 refills | Status: AC

## 2022-02-28 MED ORDER — IPRATROPIUM-ALBUTEROL 0.5-2.5 (3) MG/3ML IN SOLN: 3.0000 mL | RESPIRATORY_TRACT | Status: DC

## 2022-02-28 MED ORDER — ALBUTEROL SULFATE HFA 108 (90 BASE) MCG/ACT IN AERS: 2.0000 | INHALATION_SPRAY | RESPIRATORY_TRACT | Status: DC | PRN

## 2022-02-28 MED ORDER — AZITHROMYCIN 250 MG PO TABS
500.0000 mg | ORAL_TABLET | Freq: Once | ORAL | Status: AC
  Administered 2022-02-28: 500 mg via ORAL
  Filled 2022-02-28: qty 2

## 2022-02-28 MED ORDER — IPRATROPIUM-ALBUTEROL 0.5-2.5 (3) MG/3ML IN SOLN
3.0000 mL | RESPIRATORY_TRACT | Status: AC
  Administered 2022-02-28 (×2): 3 mL via RESPIRATORY_TRACT
  Filled 2022-02-28 (×2): qty 3

## 2022-02-28 MED ORDER — AZITHROMYCIN 250 MG PO TABS: 250.0000 mg | ORAL_TABLET | Freq: Every day | ORAL | 0 refills | Status: DC

## 2022-02-28 NOTE — ED Notes (Signed)
Patient has a slight end wheeze but states she feels better. Patient is 99 to 100 percent saturation on 1 lpm/[place]. States her breathing is better now . No neb given.

## 2022-02-28 NOTE — ED Notes (Signed)
ED Provider at bedside. 

## 2022-02-28 NOTE — ED Notes (Addendum)
Ambulated pt with pulse ox. Spo2 89%-93% HR 100-103 while ambulating. Pt stated she felt fine while ambulating no sob

## 2022-02-28 NOTE — ED Provider Notes (Signed)
[facility] Provider Note   CSN: [id number] Arrival date & time: 02/28/22  1945     History {Add pertinent medical, surgical, social history, OB history to HPI:1} Chief Complaint  Patient presents with  . Shortness of Breath    [name] is a 88 y.o. female with dementia, COPD, GERD, chronic diastolic heart failure, pulmonary fibrosis hyperlipidemia, hiatal hernia, urinary retention, chronic constipation who presents with shortness of breath.   Pt BIB [facility] from home c/o SOB x1 day. Pt states she did multiple breathing treatments at home with no relief. EMS reports pt was 92% on RA upon their arrival. 1 Duo Neb & '125mg'$  of Solumedrol given in route. Hx of COPD. Pt 90% on RA in triage, placed on 2L     Shortness of Breath      Home Medications Prior to Admission medications   Medication Sig Start Date End Date Taking? Authorizing Provider  doxycycline (VIBRA-TABS) 100 MG tablet Take 1 tablet (100 mg total) by mouth 2 (two) times daily. 01/29/22   Felipa Furnace, DPM  albuterol (PROAIR HFA) 108 (90 Base) MCG/ACT inhaler Inhale 2 puffs into the lungs every 4 (four) hours as needed for wheezing or shortness of breath. Patient taking differently: Inhale 2 puffs into the lungs every 6 (six) hours as needed for wheezing or shortness of breath. 06/12/21   Johnson, Clanford L, MD  albuterol (PROVENTIL) (2.5 MG/3ML) 0.083% nebulizer solution Take 3 mLs (2.5 mg total) by nebulization every 6 (six) hours as needed for wheezing or shortness of breath. Patient taking differently: Take 2.5 mg by nebulization in the morning, at noon, in the evening, and at bedtime. 06/23/18   Gerlene Fee, NP  ALPRAZolam Duanne Moron) 0.5 MG tablet Take 1 tablet (0.5 mg total) by mouth 2 (two) times daily. 09/30/21   Shelly Coss, MD  collagenase (SANTYL) 250 UNIT/GM ointment Apply 1 Application topically daily. 02/02/22   Felipa Furnace, DPM  diclofenac Sodium  (VOLTAREN) 1 % GEL APPLY AS DIRECTED TO THE AFFECTED KNEE FOUR TIMES DAILY. Patient taking differently: Apply 4 g topically 4 (four) times daily. 08/04/20   Carole Civil, MD  donepezil (ARICEPT) 10 MG tablet Take 10 mg by mouth daily. 07/25/19   [provider]  ferrous sulfate 325 (65 FE) MG tablet Take 1 tablet (325 mg total) by mouth daily. 09/30/21 09/30/22  Shelly Coss, MD  furosemide (LASIX) 40 MG tablet Take 40 mg by mouth daily.    [provider]  ipratropium (ATROVENT) 0.02 % nebulizer solution Take 2.5 mLs (0.5 mg total) by nebulization every 6 (six) hours as needed for wheezing or shortness of breath. 09/30/21   Shelly Coss, MD  LINZESS 145 MCG CAPS capsule Take 145 mcg by mouth daily as needed (bowel movement). 05/22/21   [provider]  loratadine (CLARITIN) 10 MG tablet Take 10 mg by mouth daily. 08/29/21   [provider]  memantine (NAMENDA) 10 MG tablet Take 10 mg by mouth 2 (two) times daily.    [provider]  methylPREDNISolone (MEDROL DOSEPAK) 4 MG TBPK tablet Take as directed 02/02/22   Felipa Furnace, DPM  Multiple Vitamin (MULTIVITAMIN WITH MINERALS) TABS tablet Take 1 tablet by mouth daily.    [provider]  mupirocin ointment (BACTROBAN) 2 % Apply 1 Application topically daily. 01/12/22   McDonald, Stephan Minister, DPM  pantoprazole (PROTONIX) 40 MG tablet TAKE (1) TABLET BY MOUTH TWICE A DAY  WITH MEALS (BREAKFAST AND SUPPER) Patient taking differently: Take 40 mg by mouth daily. 05/24/19   Carlis Stable, NP  polyethylene glycol (MIRALAX / GLYCOLAX) 17 g packet Take 17 g by mouth daily. 09/30/21   Shelly Coss, MD  potassium chloride (K-DUR) 10 MEQ tablet Take 1 tablet (10 mEq total) by mouth daily. 06/23/18   Gerlene Fee, NP  senna-docusate (SENOKOT-S) 8.6-50 MG tablet Take 1 tablet by mouth 2 (two) times daily. 09/30/21   Shelly Coss, MD  simvastatin (ZOCOR) 10 MG tablet Take 5 mg by mouth daily. 02/27/21    [provider]  traMADol (ULTRAM) 50 MG tablet Take 1 tablet (50 mg total) by mouth every 8 (eight) hours as needed for moderate pain. 09/30/21   Shelly Coss, MD  umeclidinium-vilanterol (ANORO ELLIPTA) 62.5-25 MCG/INH AEPB Inhale 1 puff into the lungs daily. 06/23/18   Gerlene Fee, NP  zolpidem (AMBIEN) 5 MG tablet Take 5 mg by mouth at bedtime as needed for sleep.    [provider]      Allergies    Codeine, Codeine, Levofloxacin, Sulfonamide derivatives, Doxycycline, Septra [sulfamethoxazole-trimethoprim], Sulfa antibiotics, and Trimethoprim    Review of Systems   Review of Systems  Respiratory:  Positive for shortness of breath.    Review of systems {pos/neg:18640::"Negative","Positive"} for ***.  A 10 point review of systems was performed and is negative unless otherwise reported in HPI.  Physical Exam Updated Vital Signs BP 124/64   Pulse 79   Temp 97.6 F (36.4 C) (Oral)   Resp (!) 21   Ht 5' (1.524 m)   Wt 47 kg   SpO2 96%   BMI 20.24 kg/m  Physical Exam General: Normal appearing {Desc; female/female:11659}, lying in bed.  HEENT: PERRLA, Sclera anicteric, MMM, trachea midline.  Cardiology: RRR, no murmurs/rubs/gallops. BL radial and DP pulses equal bilaterally.  Resp: Normal respiratory rate and effort. CTAB, no wheezes, rhonchi, crackles.  Abd: Soft, non-tender, non-distended. No rebound tenderness or guarding.  GU: Deferred. MSK: No peripheral edema or signs of trauma. Extremities without deformity or TTP. No cyanosis or clubbing. Skin: warm, dry. No rashes or lesions. Back: No CVA tenderness Neuro: A&Ox4, CNs II-XII grossly intact. MAEs. Sensation grossly intact.  Psych: Normal mood and affect.   ED Results / Procedures / Treatments   Labs (all labs ordered are listed, but only abnormal results are displayed) Labs Reviewed  CBC - Abnormal; Notable for the following components:      Result Value   RBC 3.74 (*)    Hemoglobin 10.5 (*)     HCT 34.1 (*)    All other components within normal limits  BASIC METABOLIC PANEL    EKG None  Radiology DG Chest Port 1 View  Result Date: 02/28/2022 CLINICAL DATA:  Dyspnea EXAM: PORTABLE CHEST 1 VIEW COMPARISON:  09/30/2021 FINDINGS: Chronic background interstitial thickening is again noted. However, progressive bibasilar interstitial infiltrates have developed which may relate to changes of superimposed multifocal infection or aspiration. No pneumothorax or pleural effusion. Cardiac size is within normal limits. Previously noted large hiatal hernia is not well appreciated on this examination. No acute bone abnormality. IMPRESSION: 1. Progressive bibasilar interstitial infiltrates, possibly infectious or inflammatory in nature. 2. Stable background chronic interstitial thickening. Electronically Signed   By: Fidela Salisbury M.D.   On: 02/28/2022 20:40    Procedures Procedures  {Document cardiac monitor, telemetry assessment procedure when appropriate:1}  Medications Ordered in ED Medications  albuterol (VENTOLIN HFA) 108 (90 Base)  MCG/ACT inhaler 2 puff (has no administration in time range)    ED Course/ Medical Decision Making/ A&P                          Medical Decision Making Amount and/or Complexity of Data Reviewed Labs: ordered. Radiology: ordered. Decision-making details documented in ED Course.  Risk Prescription drug management.    This patient presents to the ED for concern of ***, this involves an extensive number of treatment options, and is a complaint that carries with it a high risk of complications and morbidity.  I considered the following differential and admission for this acute, potentially life threatening condition.   MDM:    ***  Clinical Course as of 02/28/22 2130  Sun Feb 28, 2022  2052 O2 Flow Rate (L/min): 2 L/min [HN]  2052 SpO2: 97 % [HN]  2052 Resp(!): 22 [HN]  2052 DG Chest Port 1 View 1. Progressive bibasilar interstitial  infiltrates, possibly infectious or inflammatory in nature. 2. Stable background chronic interstitial thickening.   [HN]  2114 Has been coughing x 3-4 weeks, does use inhalers and breathing treatments at home which sometimes help but this morning she was breathing hard and wheezing very badly. Has had increased cough for a few weeks, productive of white sputum.  [HN]    Clinical Course User Index [HN] Audley Hose, MD    Labs: I Ordered, and personally interpreted labs.  The pertinent results include:  ***  Imaging Studies ordered: I ordered imaging studies including *** I independently visualized and interpreted imaging. I agree with the radiologist interpretation  Additional history obtained from ***.  External records from outside source obtained and reviewed including ***  Cardiac Monitoring: .The patient was maintained on a cardiac monitor.  I personally viewed and interpreted the cardiac monitored which showed an underlying rhythm of: ***  Reevaluation: After the interventions noted above, I reevaluated the patient and found that they have :{resolved/improved/worsened:23923::"improved"}  Social Determinants of Health: .***  Disposition:  ***  Co morbidities that complicate the patient evaluation . Past Medical History:  Diagnosis Date  . Anxiety   . Chronic diastolic (congestive) heart failure ([place])   . Chronic obstructive pulmonary disease, unspecified ([place])   . COPD (chronic obstructive pulmonary disease) ([place])   . GERD (gastroesophageal reflux disease)   . Hyperlipidemia   . IBS (irritable bowel syndrome)   . Lumbago with sciatica   . Osteoporosis   . Overactive bladder   . Pneumonia   . Pulmonary fibrosis ([facility])      Medicines Meds ordered this encounter  Medications  . albuterol (VENTOLIN HFA) 108 (90 Base) MCG/ACT inhaler 2 puff    I have reviewed the patients home medicines and have made adjustments as needed  Problem List / ED Course: Problem  List Items Addressed This Visit   None        {Document critical care time when appropriate:1} {Document review of labs and clinical decision tools ie heart score, Chads2Vasc2 etc:1}  {Document your independent review of radiology images, and any outside records:1} {Document your discussion with family members, caretakers, and with consultants:1} {Document social determinants of health affecting pt's care:1} {Document your decision making why or why not admission, treatments were needed:1}  This note was created using dictation software, which may contain spelling or grammatical errors.

## 2022-02-28 NOTE — ED Triage Notes (Signed)
Pt BIB [facility] from home c/o SOB x1 day. Pt states she did multiple breathing treatments at home with no relief.  EMS reports pt was 92% on RA upon their arrival.   1 Duo Neb & '125mg'$  of Solumedrol given in route. Hx of COPD. Pt 90% on RA in triage, placed on  2L [place]

## 2022-02-28 NOTE — Discharge Instructions (Addendum)
Thank you for coming to [facility] Emergency Department. You were seen for shortness of breath. We did an exam, labs, and imaging, and these showed likely a COPD exacerbation.  He received nebulizers in the emergency department as well as steroids with improvement.  We will prescribe azithromycin 2050 mg to take once per day for 5 days as well as prednisone 40 mg for 5 days.  Please use your nebulizers at home including your albuterol nebulizer scheduled every 6 hours for several days..  Please follow up with your primary care provider within 1 week.   Do not hesitate to return to the ED or call 911 if you experience: -Worsening symptoms -Lightheadedness, passing out -Fevers/chills -Anything else that concerns you

## 2022-03-02 ENCOUNTER — Other Ambulatory Visit: Payer: Self-pay | Admitting: Physician Assistant

## 2022-03-02 ENCOUNTER — Ambulatory Visit
Admission: RE | Admit: 2022-03-02 | Discharge: 2022-03-02 | Disposition: A | Discharge status: Home / Self Care | Payer: Medicare Other | Source: Ambulatory Visit | Attending: Physician Assistant | Admitting: Physician Assistant

## 2022-03-02 ENCOUNTER — Ambulatory Visit
Admission: RE | Admit: 2022-03-02 | Discharge: 2022-03-02 | Disposition: A | Discharge status: Home / Self Care | Payer: Medicare Other | Attending: Physician Assistant | Admitting: Physician Assistant

## 2022-03-02 ENCOUNTER — Encounter: Payer: Medicare Other | Admitting: Physician Assistant

## 2022-03-02 DIAGNOSIS — M869 Osteomyelitis, unspecified: Secondary | ICD-10-CM | POA: Insufficient documentation

## 2022-03-02 DIAGNOSIS — I5042 Chronic combined systolic (congestive) and diastolic (congestive) heart failure: Secondary | ICD-10-CM | POA: Diagnosis not present

## 2022-03-03 NOTE — Progress Notes (Addendum)
[name], [name] ([id number]) [id number] Page 1 of 7 Visit Report for 03/02/2022 Arrival Information Details Patient Name: Date of Service: [name] 03/02/2022 2:00 PM Medical Record Number: [id number] Patient Account Number: [id number] Date of Birth/Sex: Treating [name]: 1934-04-30 (88 y.o. [name] [name]: [name] Other Clinician: Massie [name] Referring [name]: Treating [name]/Extender: [name] Weeks in Treatment: 1 Visit Information History Since Last Visit All ordered tests and consults were completed: No Patient Arrived: Wheel Chair Added or deleted any medications: No Arrival Time: 13:56 Any new allergies or adverse reactions: No Transfer Assistance: EasyPivot Patient Lift Had a fall or experienced change in No Patient Identification Verified: Yes activities of daily living that may affect Secondary Verification Process Completed: Yes risk of falls: Patient Requires Transmission-Based Precautions: No Signs or symptoms of abuse/neglect since last visito No Patient Has Alerts: No Hospitalized since last visit: No Implantable device outside of the clinic excluding No cellular tissue based products placed in the center since last visit: Has Dressing in Place as Prescribed: Yes Pain Present Now: No Electronic Signature(s) Signed: 03/05/2022 1:35:31 PM By: [name] Entered By: [name] on 03/02/2022 14:01:51 -------------------------------------------------------------------------------- Clinic Level of [name] Assessment Details Patient Name: Date of Service: [name], [name]. 03/02/2022 2:00 PM Medical Record Number: [id number] Patient Account Number: [id number] Date of Birth/Sex: Treating [name]: May 04, 1934 (88 y.o. [name] [name]: [name] Other Clinician: Massie [name] Referring [name]: Treating [name]/Extender: [name] Weeks in  Treatment: 1 Clinic Level of [name] Assessment Items TOOL 1 Quantity Score '[]'$  - 0 Use when EandM and Procedure [name] performed on INITIAL visit ASSESSMENTS - Nursing Assessment / Reassessment '[]'$  - 0 General Physical Exam (combine w/ comprehensive assessment (listed just below) when performed on new pt. evals) '[]'$  - 0 Comprehensive Assessment (HX, ROS, Risk Assessments, Wounds Hx, etc.) [name], [name] ([id number]) [id number] Page 2 of 7 ASSESSMENTS - Wound and Skin Assessment / Reassessment '[]'$  - 0 Dermatologic / Skin Assessment (not related to wound area) ASSESSMENTS - Ostomy and/or Continence Assessment and [name] '[]'$  - 0 Incontinence Assessment and Management '[]'$  - 0 Ostomy [name] Assessment and Management (repouching, etc.) PROCESS - Coordination of [name] '[]'$  - 0 Simple Patient / Family Education for ongoing [name] '[]'$  - 0 Complex (extensive) Patient / Family Education for ongoing [name] '[]'$  - 0 Staff obtains Programmer, systems, Records, T Results / Process Orders est '[]'$  - 0 Staff telephones HHA, Nursing Homes / Clarify orders / etc '[]'$  - 0 Routine Transfer to another Facility (non-emergent condition) '[]'$  - 0 Routine Hospital Admission (non-emergent condition) '[]'$  - 0 New Admissions / Biomedical engineer / Ordering NPWT Apligraf, etc. , '[]'$  - 0 Emergency Hospital Admission (emergent condition) PROCESS - Special Needs '[]'$  - 0 Pediatric / Minor Patient Management '[]'$  - 0 Isolation Patient Management '[]'$  - 0 Hearing / Language / Visual special needs '[]'$  - 0 Assessment of Community assistance (transportation, D/C planning, etc.) '[]'$  - 0 Additional assistance / Altered mentation '[]'$  - 0 Support Surface(s) Assessment (bed, cushion, seat, etc.) INTERVENTIONS - Miscellaneous '[]'$  - 0 External ear exam '[]'$  - 0 Patient Transfer (multiple staff / Civil Service fast streamer / Similar devices) '[]'$  - 0 Simple Staple / Suture removal (25 or less) '[]'$  - 0 Complex Staple / Suture removal (26 or  more) '[]'$  - 0 Hypo/Hyperglycemic Management (do not check if billed separately) '[]'$  - 0 Ankle / Brachial Index (ABI) - do not check if billed  separately Has the patient been seen at the hospital within the last three years: Yes Total Score: 0 Level Of [name]: ____ Electronic Signature(s) Signed: 03/05/2022 1:35:31 PM By: [name] Entered By: [name] on 03/02/2022 16:24:19 -------------------------------------------------------------------------------- Lower Extremity Assessment Details Patient Name: Date of Service: [name], [name]. 03/02/2022 2:00 PM Medical Record Number: [id number] Patient Account Number: [id number] Date of Birth/Sex: Treating [name]: Feb 05, 1934 (88 y.o. [name] [name]: [name] Other Clinician: Massie [name] Referring [name]: Treating [name]: [name] [name] ([id number]) [phone].pdf Page 3 of 7 Weeks in Treatment: 1 Edema Assessment Assessed: [Left: Yes] [Right: Yes] Edema: [Left: No] [Right: No] Calf Left: Right: Point of Measurement: 32 cm From Medial Instep 28.2 cm 29.3 cm Ankle Left: Right: Point of Measurement: 10 cm From Medial Instep 19.1 cm 19.4 cm Vascular Assessment Pulses: Dorsalis Pedis Palpable: [Left:Yes] [Right:Yes] Electronic Signature(s) Signed: 03/04/2022 6:12:13 PM By: [name], BSN, [name], [name], [name] [name], [name] Signed: 03/05/2022 1:35:31 PM By: [name] Entered By: [name] on 03/02/2022 14:22:12 -------------------------------------------------------------------------------- Multi Wound Chart Details Patient Name: Date of Service: [name], [name]. 03/02/2022 2:00 PM Medical Record Number: [id number] Patient Account Number: [id number] Date of Birth/Sex: Treating [name]: 12/06/1934 (88 y.o. [name] [name]: [name] Other Clinician: Massie [name] Referring Majour Frei: Treating Ravinder Lukehart/Extender: [name] Weeks in Treatment: 1 Vital Signs Height(in): 40 Pulse(bpm): 43 Weight(lbs): 105 Blood Pressure(mmHg): 124/75 Body Mass Index(BMI): 19.2 Temperature(F): 98.281 Respiratory Rate(breaths/min): 18 [1:Photos:] [N/A:N/A] Distal, Medial Foot Right T Fifth oe N/A Wound Location: Gradually Appeared Blister N/A Wounding Event: Pressure Ulcer Pressure Ulcer N/A Primary Etiology: Chronic Obstructive Pulmonary Chronic Obstructive Pulmonary N/A Comorbid History: Disease (COPD), Congestive Heart Disease (COPD), Congestive Heart Failure, Dementia Failure, Dementia Virella, Azelie [name] ([id number]) [id number] Page 4 of 7 01/22/2022 03/02/2022 N/A Date Acquired: 1 0 N/A Weeks of Treatment: Open Open N/A Wound Status: No No N/A Wound Recurrence: 0.2x0.5x0.4 0.3x0.3x0.1 N/A Measurements [name] x W x D (cm) 0.079 0.071 N/A A (cm) : rea 0.031 0.007 N/A Volume (cm) : -11.30% N/A N/A % Reduction in A rea: -121.40% N/A N/A % Reduction in Volume: 9 Starting Position 1 (o'clock): 6 Ending Position 1 (o'clock): 0.5 Maximum Distance 1 (cm): Yes No N/A Undermining: Category/Stage III Unstageable/Unclassified N/A Classification: Medium Small N/A Exudate A mount: Serosanguineous Serosanguineous N/A Exudate Type: red, brown red, brown N/A Exudate Color: N/A Flat and Intact N/A Wound Margin: Small (1-33%) None Present (0%) N/A Granulation A mount: Pink N/A N/A Granulation Quality: Large (67-100%) None Present (0%) N/A Necrotic A mount: Fat Layer (Subcutaneous Tissue): Yes Fascia: Yes N/A Exposed Structures: Fascia: No Fat Layer (Subcutaneous Tissue): Yes Tendon: No Tendon: No Muscle: No Muscle: No Joint: No Joint: No Bone: No Bone: No None N/A N/A Epithelialization: Treatment Notes Electronic Signature(s) Signed: 03/05/2022 1:35:31 PM By: [name] Entered By: [name] on 03/02/2022  14:22:28 -------------------------------------------------------------------------------- Pain Assessment Details Patient Name: Date of Service: [name], [name]. 03/02/2022 2:00 PM Medical Record Number: [id number] Patient Account Number: [id number] Date of Birth/Sex: Treating [name]: 13-Mar-1934 (88 y.o. [name] Ladawn Boullion: [name] Other Clinician: Massie [name] Referring Alexie Samson: Treating Khylei Wilms/Extender: [name] Weeks in Treatment: 1 Active Problems Location of Pain Severity and Description of Pain Patient Has Paino No Site Locations [place], [place] ([id number]) [id number] Page 5 of 7 Pain Management and Medication Current Pain Management: Electronic Signature(s) Signed: 03/04/2022 6:12:13 PM By:  [name], BSN, [name], [employer], Leisure centre manager, [name] Signed: 03/05/2022 1:35:31 PM By: [name] Entered By: [name] on 03/02/2022 14:04:43 -------------------------------------------------------------------------------- Wound Assessment Details Patient Name: Date of Service: [name], [name]. 03/02/2022 2:00 PM Medical Record Number: [id number] Patient Account Number: [id number] Date of Birth/Sex: Treating [name]: 13-Apr-1934 (88 y.o. [name], [name] Primary [name] Edahi Kroening: [name] Other Clinician: Massie [name] Referring Joscelyn Hardrick: Treating Giovonnie Trettel/Extender: [name] Weeks in Treatment: 1 Wound Status Wound Number: 1 Primary Pressure Ulcer Etiology: Wound Location: Distal, Medial Foot Wound Open Wounding Event: Gradually Appeared Status: Date Acquired: 01/22/2022 Comorbid Chronic Obstructive Pulmonary Disease (COPD), Congestive Weeks Of Treatment: 1 History: Heart Failure, Dementia Clustered Wound: No Photos Wound Measurements Length: (cm) 0.2 Width: (cm) 0.5 Hussar, Inari [name] ([id number]) Depth: (cm) 0.4 Area: (cm) 0.07 Volume: (cm) 0.03 % Reduction in Area: -11.3% % Reduction in Volume:  -121.4% [id number] Page 6 of 7 Epithelialization: None 9 Undermining: Yes 1 Starting Position (o'clock): 9 Ending Position (o'clock): 6 Maximum Distance: (cm) 0.5 Wound Description Classification: Category/Stage III Exudate Amount: Medium Exudate Type: Serosanguineous Exudate Color: red, brown Foul Odor After Cleansing: No Slough/Fibrino Yes Wound Bed Granulation Amount: Small (1-33%) Exposed Structure Granulation Quality: Pink Fascia Exposed: No Necrotic Amount: Large (67-100%) Fat Layer (Subcutaneous Tissue) Exposed: Yes Necrotic Quality: Adherent Slough Tendon Exposed: No Muscle Exposed: No Joint Exposed: No Bone Exposed: No Electronic Signature(s) Signed: 03/04/2022 6:12:13 PM By: [name], BSN, [name], [name], [name] [name], [name] Signed: 03/05/2022 1:35:31 PM By: [name] Entered By: [name] on 03/02/2022 14:19:40 -------------------------------------------------------------------------------- Wound Assessment Details Patient Name: Date of Service: [name], [name]. 03/02/2022 2:00 PM Medical Record Number: [id number] Patient Account Number: [id number] Date of Birth/Sex: Treating [name]: 10/31/1934 (88 y.o. [name] Aziya Arena: [name] Other Clinician: Massie [name] Referring Andreah Goheen: Treating [name]/Extender: [name] Weeks in Treatment: 1 Wound Status Wound Number: 2 Primary Pressure Ulcer Etiology: Wound Location: Right T Fifth oe Wound Open Wounding Event: Blister Status: Date Acquired: 03/02/2022 Comorbid Chronic Obstructive Pulmonary Disease (COPD), Congestive Weeks Of Treatment: 0 History: Heart Failure, Dementia Clustered Wound: No Photos Wound Measurements Length: (cm) 0.3 Meyer, [name] ([id number]) Width: (cm) 0.3 Depth: (cm) 0.1 Area: (cm) 0. Volume: (cm) 0. % Reduction in Area: [id number] Page 7 of 7 % Reduction in Volume: Tunneling: No 071  Undermining: No 007 Wound Description Classification: Unstageable/Unclassified Wound Margin: Flat and Intact Exudate Amount: Small Exudate Type: Serosanguineous Exudate Color: red, brown Foul Odor After Cleansing: No Slough/Fibrino No Wound Bed Granulation Amount: None Present (0%) Exposed Structure Necrotic Amount: None Present (0%) Fascia Exposed: Yes Fat Layer (Subcutaneous Tissue) Exposed: Yes Tendon Exposed: No Muscle Exposed: No Joint Exposed: No Bone Exposed: No Electronic Signature(s) Signed: 03/04/2022 6:12:13 PM By: [name], BSN, [name], [name], [name] [name], [name] Signed: 03/05/2022 1:35:31 PM By: [name] Entered By: [name] on 03/02/2022 14:18:40 -------------------------------------------------------------------------------- Vitals Details Patient Name: Date of Service: [name], [name]. 03/02/2022 2:00 PM Medical Record Number: [id number] Patient Account Number: [id number] Date of Birth/Sex: Treating [name]: [birth year] (88 y.o. [name], [name] Primary [name] Tejasvi Brissett: [name] Other Clinician: Massie [name] Referring Jermani Eberlein: Treating Daesia Zylka/Extender: [name] Weeks in Treatment: 1 Vital Signs Time Taken: 14:02 Temperature (F): 98.281 Height (in): 62 Pulse (bpm): 81 Weight (lbs): 105 Respiratory Rate (breaths/min): 18 Body Mass Index (BMI): 19.2 Blood Pressure (mmHg): 124/75 Reference Range: 80 - 120 mg / dl Electronic Signature(s) Signed: 03/05/2022 1:35:31 PM By: [name] Entered  ByMassie [name] on 03/02/2022 14:04:38

## 2022-03-03 NOTE — Progress Notes (Addendum)
Lacey, [name] ([id number]) [id number] Page 1 of 7 Visit Report for 03/02/2022 Chief Complaint Document Details Patient Name: Date of Service: [name] 03/02/2022 2:00 PM Medical Record Number: [id number] Patient Account Number: [id number] Date of Birth/Sex: Treating RN: 09-19-1934 (88 y.o. [name] Primary Care Provider: Denyce Robert Other Clinician: Massie Kluver Referring Provider: Treating Provider/Extender: Norwood Levo Weeks in Treatment: 1 Information Obtained from: Patient Chief Complaint Left foot pressure ulcer Electronic Signature(s) Signed: 03/02/2022 1:54:55 PM By: Worthy Keeler PA-C Entered By: Worthy Keeler on 03/02/2022 13:54:55 -------------------------------------------------------------------------------- Debridement Details Patient Name: Date of Service: [name], [name] 03/02/2022 2:00 PM Medical Record Number: [id number] Patient Account Number: [id number] Date of Birth/Sex: Treating RN: May 04, 1934 (88 y.o. [name] Primary Care Provider: Denyce Robert Other Clinician: Massie Kluver Referring Provider: Treating Provider/Extender: Norwood Levo Weeks in Treatment: 1 Debridement Performed for Assessment: Wound #1 Distal,Medial Foot Performed By: Physician Tommie Sams., PA-C Debridement Type: Debridement Level of Consciousness (Pre-procedure): Awake and Alert Pre-procedure Verification/Time Out Yes - 15:08 Taken: Start Time: 15:08 T Area Debrided (L x W): otal 0.2 (cm) x 0.5 (cm) = 0.1 (cm) Tissue and other material debrided: Slough, Subcutaneous, Slough Level: Skin/Subcutaneous Tissue Debridement Description: Excisional Instrument: Curette Bleeding: Minimum Hemostasis Achieved: Pressure Response to Treatment: Procedure was tolerated well Level of Consciousness (Post- Awake and Alert procedure): Post Debridement Measurements of Total Wound Barba, Keily L  ([id number]) [id number] Page 2 of 7 Length: (cm) 0.2 Stage: Category/Stage III Width: (cm) 0.5 Depth: (cm) 0.4 Volume: (cm) 0.031 Character of Wound/Ulcer Post Debridement: Stable Post Procedure Diagnosis Same as Pre-procedure Electronic Signature(s) Signed: 03/04/2022 5:15:32 PM By: Worthy Keeler PA-C Signed: 03/04/2022 6:12:13 PM By: Gretta Cool BSN, RN, CWS, [name] RN, BSN Signed: 03/05/2022 1:35:31 PM By: Massie Kluver Entered By: Massie Kluver on 03/02/2022 15:09:28 -------------------------------------------------------------------------------- HPI Details Patient Name: Date of Service: [name], [name] 03/02/2022 2:00 PM Medical Record Number: [id number] Patient Account Number: [id number] Date of Birth/Sex: Treating RN: [birth year] (88 y.o. [name] Primary Care Provider: Denyce Robert Other Clinician: Massie Kluver Referring Provider: Treating Provider/Extender: Norwood Levo Weeks in Treatment: 1 History of Present Illness HPI Description: 02-22-2022 upon evaluation today patient appears to be doing somewhat poorly in regard to her left medial first metatarsal head where she does have a bunion. Unfortunately this is causing some pressure and has led to a pressure injury at this site. Fortunately I do not see any signs of infection at this point but that is something we definitely can need to keep a close eye on. Fortunately there does not appear to be any signs of active infection locally nor systemically at this point. No fevers, chills, nausea, vomiting, or diarrhea. Patient does have a history of of vascular dementia, COPD, congestive heart failure, and the dementia does affect her ability to be able to in some cases follow directions. She is very nervous about things in general. This is stated to have started somewhere around January 22, 2022. 03-02-2022 upon evaluation today patient appears to be doing well currently in regard to  her wounds all things considered. Fortunately I do not see any signs of infection unfortunately both wounds are still open and the one on the medial foot first metatarsal location is still the worst. Although the area over the fifth toe right foot actually is also open at this point unfortunately. With that being said I do think we can need  to continue to monitor for any signs of worsening or infection although right now I think she is doing okay in that regard. Electronic Signature(s) Signed: 03/03/2022 4:14:05 PM By: Worthy Keeler PA-C Entered By: Worthy Keeler on 03/03/2022 16:14:05 -------------------------------------------------------------------------------- Physical Exam Details Patient Name: Date of Service: [name], [name] 03/02/2022 2:00 PM Medical Record Number: [id number] Patient Account Number: [id number] Date of Birth/Sex: Treating RN: 04/29/1934 (88 y.o. [name], [name], [name] ([id number]) [id number] Page 3 of 7 Primary Care Provider: Denyce Robert Other Clinician: Massie Kluver Referring Provider: Treating Provider/Extender: Norwood Levo Weeks in Treatment: 1 Constitutional Well-nourished and well-hydrated in no acute distress. Respiratory normal breathing without difficulty. Psychiatric this patient is able to make decisions and demonstrates good insight into disease process. Alert and Oriented x 3. pleasant and cooperative. Notes Upon inspection patient's wound bed actually showed signs of some slough and biofilm buildup. With that being said she also has arterial flow which is on the lower side this is can take some time to improve and to heal to be honest. With that being said I did actually go ahead and perform some light debridement in regard to the wound on the first met head medial foot and she tolerated this without complication. She did have some minimal bleeding controlled with pressure and  postdebridement this looks a little bit better. Fortunately I do not see any signs of infection systemically which is great news. Electronic Signature(s) Signed: 03/03/2022 4:14:58 PM By: Worthy Keeler PA-C Entered By: Worthy Keeler on 03/03/2022 16:14:58 -------------------------------------------------------------------------------- Physician Orders Details Patient Name: Date of Service: [name], Vianca L. 03/02/2022 2:00 PM Medical Record Number: [id number] Patient Account Number: [id number] Date of Birth/Sex: Treating RN: [birth year] (88 y.o. [name] Primary Care Provider: Denyce Robert Other Clinician: Massie Kluver Referring Provider: Treating Provider/Extender: Norwood Levo Weeks in Treatment: 1 Verbal / Phone Orders: No Diagnosis Coding ICD-10 Coding Code Description [phone] Pressure ulcer of other site, stage 3 I50.42 Chronic combined systolic (congestive) and diastolic (congestive) heart failure F01.511 Vascular dementia, unspecified severity, with agitation J44.9 Chronic obstructive pulmonary disease, unspecified Follow-up Appointments Return Appointment in 1 week. Bathing/ [employer] wounds with antibacterial soap and water. Anesthetic (Use 'Patient Medications' Section for Anesthetic Order Entry) Lidocaine applied to wound bed Edema Control - Lymphedema / Segmental Compressive Device / Other Elevate, Exercise Daily and A void Standing for Long Periods of Time. Elevate legs to the level of the heart and pump ankles as often as possible Elevate leg(s) parallel to the floor when sitting. Wound Treatment Wound #1 - Foot Wound Laterality: Medial, Distal Cleanser: Byram Ancillary Kit - 15 Day Supply (Generic) 3 x Per Week/30 Days Discharge Instructions: Use supplies as instructed; Kit contains: (15) Saline Bullets; (15) 3x3 Gauze; 15 pr Gloves Scheaffer, Ashleyann L ([id number]) [id number] Page 4 of 7 Cleanser: Soap  and Water 3 x Per Week/30 Days Discharge Instructions: Gently cleanse wound with antibacterial soap, rinse and pat dry prior to dressing wounds Prim Dressing: Promogran Matrix 4.34 (in) 3 x Per Week/30 Days ary Discharge Instructions: Moisten w/normal saline or sterile water; Cover wound as directed. Do not remove from wound bed. Secondary Dressing: (BORDER) Zetuvit Plus SILICONE BORDER Dressing 4x4 (in/in) (Generic) 3 x Per Week/30 Days Discharge Instructions: Please do not put silicone bordered dressings under wraps. Use non-bordered dressing only. Wound #2 - T Fifth oe Wound Laterality: Right Cleanser: Byram Ancillary Kit - 15 Day Supply (  Generic) 3 x Per Week/30 Days Discharge Instructions: Use supplies as instructed; Kit contains: (15) Saline Bullets; (15) 3x3 Gauze; 15 pr Gloves Cleanser: Soap and Water 3 x Per Week/30 Days Discharge Instructions: Gently cleanse wound with antibacterial soap, rinse and pat dry prior to dressing wounds Prim Dressing: Promogran Matrix 4.34 (in) 3 x Per Week/30 Days ary Discharge Instructions: Moisten w/normal saline or sterile water; Cover wound as directed. Do not remove from wound bed. Secondary Dressing: (BORDER) Zetuvit Plus SILICONE BORDER Dressing 4x4 (in/in) (Generic) 3 x Per Week/30 Days Discharge Instructions: Please do not put silicone bordered dressings under wraps. Use non-bordered dressing only. Radiology X-ray, foot - bilat foot xray Electronic Signature(s) Signed: 03/04/2022 5:15:32 PM By: Worthy Keeler PA-C Signed: 03/05/2022 1:35:31 PM By: Massie Kluver Entered By: Massie Kluver on 03/02/2022 15:26:31 -------------------------------------------------------------------------------- Problem List Details Patient Name: Date of Service: [name], [name] 03/02/2022 2:00 PM Medical Record Number: [id number] Patient Account Number: [id number] Date of Birth/Sex: Treating RN: Mar 02, 1934 (88 y.o. [name] Primary Care Provider: Denyce Robert Other Clinician: Massie Kluver Referring Provider: Treating Provider/Extender: Norwood Levo Weeks in Treatment: 1 Active Problems ICD-10 Encounter Code Description Active Date MDM Diagnosis [phone] Pressure ulcer of other site, stage 3 02/22/2022 No Yes I50.42 Chronic combined systolic (congestive) and diastolic (congestive) heart failure 02/22/2022 No Yes F01.511 Vascular dementia, unspecified severity, with agitation 02/22/2022 No Yes J44.9 Chronic obstructive pulmonary disease, unspecified 02/22/2022 No Yes Hyland, Jalonda L ([id number]) [id number] Page 5 of 7 Inactive Problems Resolved Problems Electronic Signature(s) Signed: 03/02/2022 1:54:49 PM By: Worthy Keeler PA-C Entered By: Worthy Keeler on 03/02/2022 13:54:49 -------------------------------------------------------------------------------- Progress Note Details Patient Name: Date of Service: [name], [name] 03/02/2022 2:00 PM Medical Record Number: [id number] Patient Account Number: [id number] Date of Birth/Sex: Treating RN: 07-03-1934 (88 y.o. [name] Primary Care Provider: Denyce Robert Other Clinician: Massie Kluver Referring Provider: Treating Provider/Extender: Norwood Levo Weeks in Treatment: 1 Subjective Chief Complaint Information obtained from Patient Left foot pressure ulcer History of Present Illness (HPI) 02-22-2022 upon evaluation today patient appears to be doing somewhat poorly in regard to her left medial first metatarsal head where she does have a bunion. Unfortunately this is causing some pressure and has led to a pressure injury at this site. Fortunately I do not see any signs of infection at this point but that is something we definitely can need to keep a close eye on. Fortunately there does not appear to be any signs of active infection locally nor systemically at this point. No fevers, chills, nausea, vomiting, or  diarrhea. Patient does have a history of of vascular dementia, COPD, congestive heart failure, and the dementia does affect her ability to be able to in some cases follow directions. She is very nervous about things in general. This is stated to have started somewhere around January 22, 2022. 03-02-2022 upon evaluation today patient appears to be doing well currently in regard to her wounds all things considered. Fortunately I do not see any signs of infection unfortunately both wounds are still open and the one on the medial foot first metatarsal location is still the worst. Although the area over the fifth toe right foot actually is also open at this point unfortunately. With that being said I do think we can need to continue to monitor for any signs of worsening or infection although right now I think she is doing okay in that regard. Objective Constitutional Well-nourished  and well-hydrated in no acute distress. Vitals Time Taken: 2:02 PM, Height: 62 in, Weight: 105 lbs, BMI: 19.2, Temperature: 98.281 F, Pulse: 81 bpm, Respiratory Rate: 18 breaths/min, Blood Pressure: 124/75 mmHg. Respiratory normal breathing without difficulty. Psychiatric this patient is able to make decisions and demonstrates good insight into disease process. Alert and Oriented x 3. pleasant and cooperative. General Notes: Upon inspection patient's wound bed actually showed signs of some slough and biofilm buildup. With that being said she also has arterial flow which is on the lower side this is can take some time to improve and to heal to be honest. With that being said I did actually go ahead and perform some light debridement in regard to the wound on the first met head medial foot and she tolerated this without complication. She did have some minimal bleeding Hallam, Chastidy L ([id number]) [id number] Page 6 of 7 controlled with pressure and postdebridement this looks a little bit better.  Fortunately I do not see any signs of infection systemically which is great news. Integumentary (Hair, Skin) Wound #1 status is Open. Original cause of wound was Gradually Appeared. The date acquired was: 01/22/2022. The wound has been in treatment 1 weeks. The wound is located on the [place]. The wound measures 0.2cm length x 0.5cm width x 0.4cm depth; 0.079cm^2 area and 0.031cm^3 volume. There is Fat Layer (Subcutaneous Tissue) exposed. There is undermining starting at 9:00 and ending at 6:00 with a maximum distance of 0.5cm. There is a medium amount of serosanguineous drainage noted. There is small (1-33%) pink granulation within the wound bed. There is a large (67-100%) amount of necrotic tissue within the wound bed including Adherent Slough. Wound #2 status is Open. Original cause of wound was Blister. The date acquired was: 03/02/2022. The wound is located on the Right T Fifth. The wound oe measures 0.3cm length x 0.3cm width x 0.1cm depth; 0.071cm^2 area and 0.007cm^3 volume. There is Fat Layer (Subcutaneous Tissue) and fascia exposed. There is no tunneling or undermining noted. There is a small amount of serosanguineous drainage noted. The wound margin is flat and intact. There is no granulation within the wound bed. There is no necrotic tissue within the wound bed. Assessment Active Problems ICD-10 Pressure ulcer of other site, stage 3 Chronic combined systolic (congestive) and diastolic (congestive) heart failure Vascular dementia, unspecified severity, with agitation Chronic obstructive pulmonary disease, unspecified Procedures Wound #1 Pre-procedure diagnosis of Wound #1 is a Pressure Ulcer located on the Distal,Medial Foot . There was a Excisional Skin/Subcutaneous Tissue Debridement with a total area of 0.1 sq cm performed by Tommie Sams., PA-C. With the following instrument(s): Curette Material removed includes Subcutaneous Tissue and Slough and. A time out was  conducted at 15:08, prior to the start of the procedure. A Minimum amount of bleeding was controlled with Pressure. The procedure was tolerated well. Post Debridement Measurements: 0.2cm length x 0.5cm width x 0.4cm depth; 0.031cm^3 volume. Post debridement Stage noted as Category/Stage III. Character of Wound/Ulcer Post Debridement is stable. Post procedure Diagnosis Wound #1: Same as Pre-Procedure Plan Follow-up Appointments: Return Appointment in 1 week. Bathing/ Shower/ Hygiene: Wash wounds with antibacterial soap and water. Anesthetic (Use 'Patient Medications' Section for Anesthetic Order Entry): Lidocaine applied to wound bed Edema Control - Lymphedema / Segmental Compressive Device / Other: Elevate, Exercise Daily and Avoid Standing for Long Periods of Time. Elevate legs to the level of the heart and pump ankles as often as possible Elevate leg(s) parallel to the  floor when sitting. Radiology ordered were: X-ray, foot - bilat foot xray WOUND #1: - Foot Wound Laterality: Medial, Distal Cleanser: Byram Ancillary Kit - 15 Day Supply (Generic) 3 x Per Week/30 Days Discharge Instructions: Use supplies as instructed; Kit contains: (15) Saline Bullets; (15) 3x3 Gauze; 15 pr Gloves Cleanser: Soap and Water 3 x Per Week/30 Days Discharge Instructions: Gently cleanse wound with antibacterial soap, rinse and pat dry prior to dressing wounds Prim Dressing: Promogran Matrix 4.34 (in) 3 x Per Week/30 Days ary Discharge Instructions: Moisten w/normal saline or sterile water; Cover wound as directed. Do not remove from wound bed. Secondary Dressing: (BORDER) Zetuvit Plus SILICONE BORDER Dressing 4x4 (in/in) (Generic) 3 x Per Week/30 Days Discharge Instructions: Please do not put silicone bordered dressings under wraps. Use non-bordered dressing only. WOUND #2: - T Fifth Wound Laterality: Right oe Cleanser: Byram Ancillary Kit - 15 Day Supply (Generic) 3 x Per Week/30 Days Discharge  Instructions: Use supplies as instructed; Kit contains: (15) Saline Bullets; (15) 3x3 Gauze; 15 pr Gloves Cleanser: Soap and Water 3 x Per Week/30 Days Discharge Instructions: Gently cleanse wound with antibacterial soap, rinse and pat dry prior to dressing wounds Prim Dressing: Promogran Matrix 4.34 (in) 3 x Per Week/30 Days ary Discharge Instructions: Moisten w/normal saline or sterile water; Cover wound as directed. Do not remove from wound bed. Secondary Dressing: (BORDER) Zetuvit Plus SILICONE BORDER Dressing 4x4 (in/in) (Generic) 3 x Per Week/30 Days Discharge Instructions: Please do not put silicone bordered dressings under wraps. Use non-bordered dressing only. 1. I am good recommend currently that we have the patient continue to monitor for any signs of infection or worsening. Based on what I am seeing I do believe on the right track although this is still going to take quite some time to heal her blood flow may be okay but I do not know that it is perfect. 2. I am good recommend specifically that we stick with the silver collagen dressing for both wound locations followed by the bordered foam on the metatarsal head location and a Band-Aid on the other. ORIS, HENNECKE ([id number]) [id number] Page 7 of 7 3. I am also can recommend she should continue to avoid anything rubbing on this area obviously I think shoes are one of the biggest culprits that she is going to have to watch out for. We will see patient back for reevaluation in 1 week here in the clinic. If anything worsens or changes patient will contact our office for additional recommendations. Electronic Signature(s) Signed: 03/03/2022 4:15:54 PM By: Worthy Keeler PA-C Entered By: Worthy Keeler on 03/03/2022 16:15:53 -------------------------------------------------------------------------------- SuperBill Details Patient Name: Date of Service: [name], [name] 03/02/2022 Medical Record Number:  [id number] Patient Account Number: [id number] Date of Birth/Sex: Treating RN: September 07, 1934 (88 y.o. [name] Primary Care Provider: Denyce Robert Other Clinician: Massie Kluver Referring Provider: Treating Provider/Extender: Norwood Levo Weeks in Treatment: 1 Diagnosis Coding ICD-10 Codes Code Description [phone] Pressure ulcer of other site, stage 3 I50.42 Chronic combined systolic (congestive) and diastolic (congestive) heart failure F01.511 Vascular dementia, unspecified severity, with agitation J44.9 Chronic obstructive pulmonary disease, unspecified Facility Procedures : CPT4 Code: [id number] Description: [id number] - DEB SUBQ TISSUE 20 SQ CM/< ICD-10 Diagnosis Description L89.893 Pressure ulcer of other site, stage 3 Modifier: Quantity: 1 Physician Procedures : CPT4 Code Description Modifier [id number] - WC PHYS SUBQ TISS 20 SQ CM ICD-10 Diagnosis Description L89.893 Pressure ulcer of other site, stage 3 Quantity:  1 Electronic Signature(s) Signed: 03/03/2022 4:16:17 PM By: Worthy Keeler PA-C Entered By: Worthy Keeler on 03/03/2022 16:16:17

## 2022-03-04 ENCOUNTER — Telehealth: Payer: Self-pay | Admitting: Cardiology

## 2022-03-04 ENCOUNTER — Encounter: Payer: Self-pay | Admitting: Radiology

## 2022-03-04 NOTE — Telephone Encounter (Signed)
[name] would like to change pt's in person appointment to telephone appointment due to difficulty transporting patient since she broke her femur bone.   Please advise.

## 2022-03-04 NOTE — Telephone Encounter (Signed)
[name] notified.

## 2022-03-04 NOTE — Telephone Encounter (Signed)
Pt daughter would like a callback regarding if pt's appt on 3/14 is able to be a Telemed visit. Please advise.

## 2022-03-08 ENCOUNTER — Encounter: Payer: Medicare Other | Attending: Internal Medicine | Admitting: Internal Medicine

## 2022-03-08 DIAGNOSIS — I5042 Chronic combined systolic (congestive) and diastolic (congestive) heart failure: Secondary | ICD-10-CM | POA: Insufficient documentation

## 2022-03-08 DIAGNOSIS — L89893 Pressure ulcer of other site, stage 3: Secondary | ICD-10-CM | POA: Insufficient documentation

## 2022-03-08 DIAGNOSIS — J449 Chronic obstructive pulmonary disease, unspecified: Secondary | ICD-10-CM | POA: Insufficient documentation

## 2022-03-08 DIAGNOSIS — F01511 Vascular dementia, unspecified severity, with agitation: Secondary | ICD-10-CM | POA: Insufficient documentation

## 2022-03-09 ENCOUNTER — Ambulatory Visit (INDEPENDENT_AMBULATORY_CARE_PROVIDER_SITE_OTHER): Payer: Medicare Other

## 2022-03-09 ENCOUNTER — Ambulatory Visit (INDEPENDENT_AMBULATORY_CARE_PROVIDER_SITE_OTHER): Payer: Medicare Other | Admitting: Orthopedic Surgery

## 2022-03-09 ENCOUNTER — Encounter: Payer: Self-pay | Admitting: Orthopedic Surgery

## 2022-03-09 DIAGNOSIS — Z96642 Presence of left artificial hip joint: Secondary | ICD-10-CM

## 2022-03-09 NOTE — Progress Notes (Signed)
Orthopaedic Postop Note  Assessment: [name] is a 88 y.o. female s/p cemented Left hip hemiarthroplasty  DOS: 09/24/2021  Plan: [name] has done very well.  She has no pain.  She is walking with the assistance of a walker, without discomfort.  Surgical incisions look good.  Radiographs are stable.  Continue with wound care for her left foot, as currently prescribed.  If they have any issues, they can return to clinic.  Otherwise, she will follow-up as needed.  Follow-up: Return if symptoms worsen or fail to improve. XR at next visit: AP pelvis and Right hip  Subjective:  Chief Complaint  Patient presents with   Routine Post Op    L hip DOS 09/24/21    History of Present Illness: [name] is a 88 y.o. female who presents following the above stated procedure.  Surgery was approximately 6 months ago.  She has done well.  She is not complaining of pain.  Occasionally, she will try to ambulate without assistance including a walker, but family can redirect her.  She is pleased with her progress.  Family is happy with her recovery.  Of note, she is currently undergoing some wound care management for wounds on her left foot.  This is being treated appropriately elsewhere.  Review of Systems: No fevers or chills No numbness or tingling No Chest Pain No shortness of breath   Objective: There were no vitals taken for this visit.  Physical Exam:  Elderly female.  No acute distress  Ambulates well with a walker.  Lateral hip surgical incision is healed.  No surrounding erythema or drainage.  No tenderness to palpation.  She is able to maintain a straight leg raise.  She tolerates gentle range of motion of the left hip.  She sits comfortably with a crosslegged position.  Toes warm and well-perfused.  She responds to light touch over her foot.  IMAGING: I personally ordered and reviewed the following images:  AP pelvis and left hip x-rays were obtained in clinic today.  These are compared to prior x-rays.  No new injuries are noted.  Hip joint is reduced.  Well-positioned hip prosthesis, with an appropriate cement mantle.  No periprosthetic lucency.  No fractures.  No bony lesions.  Impression: Left hip x-rays with stable hip hemiarthroplasty, no evidence of subsidence   Lacey Rasmussen, MD 03/09/2022 2:55 PM

## 2022-03-12 NOTE — Progress Notes (Signed)
VENIS, BANKE ([id number]) [id number] Page 1 of 10 Visit Report for 03/08/2022 Arrival Information Details Patient Name: Date of Service: [name], [name] 03/08/2022 1:45 PM Medical Record Number: [id number] Patient Account Number: [id number] Date of Birth/Sex: Treating RN: [birth year] (88 y.o. [name], [name] Primary Care Rida Loudin: Denyce Robert Other Clinician: Massie Kluver Referring Kylieann Eagles: Treating Kaitlyn Skowron/Extender: RO BSO N, MICHA EL Serafina Mitchell, Tenika Weeks in Treatment: 2 Visit Information History Since Last Visit All ordered tests and consults were completed: No Patient Arrived: Wheel Chair Added or deleted any medications: No Arrival Time: 14:03 Any new allergies or adverse reactions: No Transfer Assistance: EasyPivot Patient Lift Had a fall or experienced change in No Patient Requires Transmission-Based Precautions: No activities of daily living that may affect Patient Has Alerts: No risk of falls: Signs or symptoms of abuse/neglect since last visito No Hospitalized since last visit: No Implantable device outside of the clinic excluding No cellular tissue based products placed in the center since last visit: Has Dressing in Place as Prescribed: Yes Has Compression in Place as Prescribed: No Pain Present Now: Yes Electronic Signature(s) Signed: 03/10/2022 4:49:21 PM By: Massie Kluver Entered By: Massie Kluver on 03/08/2022 14:09:18 -------------------------------------------------------------------------------- Clinic Level of Care Assessment Details Patient Name: Date of Service: [name], [name] 03/08/2022 1:45 PM Medical Record Number: [id number] Patient Account Number: [id number] Date of Birth/Sex: Treating RN: 1934/07/22 (88 y.o. [name] Primary Care Nema Oatley: Denyce Robert Other Clinician: Massie Kluver Referring Goldie Dimmer: Treating Antony Sian/Extender: RO BSO N, [place], Tenika Weeks in Treatment: 2 Clinic  Level of Care Assessment Items TOOL 1 Quantity Score '[]'$  - 0 Use when EandM and Procedure [name] performed on INITIAL visit ASSESSMENTS - Nursing Assessment / Reassessment '[]'$  - 0 General Physical Exam (combine w/ comprehensive assessment (listed just below) when performed on new pt. evals) [place], Haven L ([id number]) [id number] Page 2 of 10 '[]'$  - 0 Comprehensive Assessment (HX, ROS, Risk Assessments, Wounds Hx, etc.) ASSESSMENTS - Wound and Skin Assessment / Reassessment '[]'$  - 0 Dermatologic / Skin Assessment (not related to wound area) ASSESSMENTS - Ostomy and/or Continence Assessment and Care '[]'$  - 0 Incontinence Assessment and Management '[]'$  - 0 Ostomy Care Assessment and Management (repouching, etc.) PROCESS - Coordination of Care '[]'$  - 0 Simple Patient / Family Education for ongoing care '[]'$  - 0 Complex (extensive) Patient / Family Education for ongoing care '[]'$  - 0 Staff obtains Programmer, systems, Records, T Results / Process Orders est '[]'$  - 0 Staff telephones HHA, Nursing Homes / Clarify orders / etc '[]'$  - 0 Routine Transfer to another Facility (non-emergent condition) '[]'$  - 0 Routine Hospital Admission (non-emergent condition) '[]'$  - 0 New Admissions / Biomedical engineer / Ordering NPWT Apligraf, etc. , '[]'$  - 0 Emergency Hospital Admission (emergent condition) PROCESS - Special Needs '[]'$  - 0 Pediatric / Minor Patient Management '[]'$  - 0 Isolation Patient Management '[]'$  - 0 Hearing / Language / Visual special needs '[]'$  - 0 Assessment of Community assistance (transportation, D/C planning, etc.) '[]'$  - 0 Additional assistance / Altered mentation '[]'$  - 0 Support Surface(s) Assessment (bed, cushion, seat, etc.) INTERVENTIONS - Miscellaneous '[]'$  - 0 External ear exam '[]'$  - 0 Patient Transfer (multiple staff / Civil Service fast streamer / Similar devices) '[]'$  - 0 Simple Staple / Suture removal (25 or less) '[]'$  - 0 Complex Staple / Suture removal (26 or more) '[]'$  -  0 Hypo/Hyperglycemic Management (do not check if billed separately) '[]'$  - 0 Ankle / Brachial Index (ABI) -  do not check if billed separately Has the patient been seen at the hospital within the last three years: Yes Total Score: 0 Level Of Care: ____ Electronic Signature(s) Signed: 03/10/2022 4:49:21 PM By: Massie Kluver Entered By: Massie Kluver on 03/08/2022 14:51:03 -------------------------------------------------------------------------------- Encounter Discharge Information Details Patient Name: Date of Service: [name], [name] 03/08/2022 1:45 PM Medical Record Number: [id number] Patient Account Number: [id number] Date of Birth/Sex: Treating RN: 05/29/1934 (88 y.o. [name] Primary Care Jaxsyn Azam: Denyce Robert Other Clinician: Addaley, [place], Neave Carlean [name] ([id number]) [id number] Page 3 of 10 Referring Josejuan Hoaglin: Treating Jasai Sorg/Extender: RO BSO N, MICHA EL Serafina Mitchell, Tenika Weeks in Treatment: 2 Encounter Discharge Information Items Post Procedure Vitals Discharge Condition: Stable Temperature (F): 98.2 Ambulatory Status: Wheelchair Pulse (bpm): 80 Discharge Destination: Home Respiratory Rate (breaths/min): 16 Transportation: Private Auto Blood Pressure (mmHg): 105/71 Accompanied By: daughter Schedule Follow-up Appointment: Yes Clinical Summary of Care: Electronic Signature(s) Signed: 03/10/2022 4:49:21 PM By: Massie Kluver Entered By: Massie Kluver on 03/08/2022 15:11:04 -------------------------------------------------------------------------------- Lower Extremity Assessment Details Patient Name: Date of Service: [name], [name] 03/08/2022 1:45 PM Medical Record Number: [id number] Patient Account Number: [id number] Date of Birth/Sex: Treating RN: 23-Jul-1934 (88 y.o. [name] Primary Care Xoey Warmoth: Denyce Robert Other Clinician: Massie Kluver Referring Cole Eastridge: Treating August Gosser/Extender: RO BSO N, MICHA EL  Serafina Mitchell, Tenika Weeks in Treatment: 2 Edema Assessment Assessed: [Left: Yes] [Right: Yes] Edema: [Left: No] [Right: No] Calf Left: Right: Point of Measurement: 32 cm From Medial Instep 28.2 cm 29 cm Ankle Left: Right: Point of Measurement: 10 cm From Medial Instep 19.4 cm 19 cm Vascular Assessment Pulses: Dorsalis Pedis Palpable: [Left:Yes] [Right:Yes] Electronic Signature(s) Signed: 03/10/2022 4:49:21 PM By: Massie Kluver Signed: 03/10/2022 9:49:41 PM By: Gretta Cool, BSN, RN, CWS, [name] RN, BSN Entered By: Massie Kluver on 03/08/2022 14:24:13 [place] [name], Haliimaile ([id number]) [id number] Page 4 of 10 -------------------------------------------------------------------------------- Multi Wound Chart Details Patient Name: Date of Service: [name] 03/08/2022 1:45 PM Medical Record Number: [id number] Patient Account Number: [id number] Date of Birth/Sex: Treating RN: July 20, 1934 (88 y.o. [name], [name] Primary Care Roan Sawchuk: Denyce Robert Other Clinician: Massie Kluver Referring Shaunice Levitan: Treating Emberly Tomasso/Extender: RO BSO N, MICHA EL Serafina Mitchell, Tenika Weeks in Treatment: 2 Vital Signs Height(in): 58 Pulse(bpm): 80 Weight(lbs): 105 Blood Pressure(mmHg): 105/71 Body Mass Index(BMI): 19.2 Temperature(F): 98.2 Respiratory Rate(breaths/min): 16 [1:Photos:] [N/A:N/A] Distal, Medial Foot Right T Fifth oe N/A Wound Location: Gradually Appeared Blister N/A Wounding Event: Pressure Ulcer Pressure Ulcer N/A Primary Etiology: Chronic Obstructive Pulmonary Chronic Obstructive Pulmonary N/A Comorbid History: Disease (COPD), Congestive Heart Disease (COPD), Congestive Heart Failure, Dementia Failure, Dementia 01/22/2022 03/02/2022 N/A Date Acquired: 2 0 N/A Weeks of Treatment: Open Open N/A Wound Status: No No N/A Wound Recurrence: 0.5x0.3x0.3 0.1x0.1x0.1 N/A Measurements L x W x D (cm) 0.118 0.008 N/A A (cm) : rea 0.035 0.001 N/A Volume (cm)  : -66.20% 88.70% N/A % Reduction in A rea: -150.00% 85.70% N/A % Reduction in Volume: 12 Starting Position 1 (o'clock): 12 Ending Position 1 (o'clock): 0.4 Maximum Distance 1 (cm): Yes N/A N/A Undermining: Category/Stage III Unstageable/Unclassified N/A Classification: Medium Small N/A Exudate A mount: Serosanguineous Serosanguineous N/A Exudate Type: red, brown red, brown N/A Exudate Color: N/A Flat and Intact N/A Wound Margin: Small (1-33%) None Present (0%) N/A Granulation A mount: Pink N/A N/A Granulation Quality: Large (67-100%) None Present (0%) N/A Necrotic A mount: Fat Layer (Subcutaneous Tissue): Yes Fascia: Yes N/A Exposed Structures: Fascia: No Fat Layer (Subcutaneous Tissue):  Yes Tendon: No Tendon: No Muscle: No Muscle: No Joint: No Joint: No Bone: No Bone: No None N/A N/A Epithelialization: Treatment Notes Electronic Signature(s) Crossen, Jessicah L ([id number] Page 5 of 10 Signed: 03/10/2022 4:49:21 PM By: Massie Kluver Entered By: Massie Kluver on 03/08/2022 14:24:23 -------------------------------------------------------------------------------- Multi-Disciplinary Care Plan Details Patient Name: Date of Service: [name], [name] 03/08/2022 1:45 PM Medical Record Number: [id number] Patient Account Number: [id number] Date of Birth/Sex: Treating RN: 16-Jul-1934 (88 y.o. [name] Primary Care Lliam Hoh: Denyce Robert Other Clinician: Massie Kluver Referring Copper Kirtley: Treating Zai Chmiel/Extender: RO BSO N, MICHA EL Serafina Mitchell, Tenika Weeks in Treatment: 2 Active Inactive Abuse / Safety / Falls / Self Care Management Nursing Diagnoses: Potential for injury related to falls Goals: Patient will remain injury free related to falls Date Initiated: 02/22/2022 Target Resolution Date: 03/23/2022 Goal Status: Active Interventions: Assess Activities of Daily Living upon admission and as needed Assess fall risk  on admission and as needed Assess: immobility, friction, shearing, incontinence upon admission and as needed Assess impairment of mobility on admission and as needed per policy Assess personal safety and home safety (as indicated) on admission and as needed Assess self care needs on admission and as needed Notes: Necrotic Tissue Nursing Diagnoses: Knowledge deficit related to management of necrotic/devitalized tissue Goals: Necrotic/devitalized tissue will be minimized in the wound bed Date Initiated: 02/22/2022 Target Resolution Date: 03/23/2022 Goal Status: Active Interventions: Assess patient pain level pre-, during and post procedure and prior to discharge Provide education on necrotic tissue and debridement process Notes: Wound/Skin Impairment Nursing Diagnoses: Knowledge deficit related to ulceration/compromised skin integrity Goals: Patient/caregiver will verbalize understanding of skin care regimen Date Initiated: 02/22/2022 Target Resolution Date: 03/23/2022 Goal Status: Active Ulcer/skin breakdown will have a volume reduction of 30% by week 4 Date Initiated: 02/22/2022 Target Resolution Date: 03/23/2022 KAWAILANI, RUMER ([id number]) [id number] Page 6 of 10 Goal Status: Active Ulcer/skin breakdown will have a volume reduction of 50% by week 8 Date Initiated: 02/22/2022 Target Resolution Date: 04/23/2022 Goal Status: Active Ulcer/skin breakdown will have a volume reduction of 80% by week 12 Date Initiated: 02/22/2022 Target Resolution Date: 05/23/2022 Goal Status: Active Ulcer/skin breakdown will heal within 14 weeks Date Initiated: 02/22/2022 Target Resolution Date: 06/23/2022 Goal Status: Active Interventions: Assess patient/caregiver ability to obtain necessary supplies Assess patient/caregiver ability to perform ulcer/skin care regimen upon admission and as needed Assess ulceration(s) every visit Notes: Electronic Signature(s) Signed: 03/10/2022  4:49:21 PM By: Massie Kluver Signed: 03/10/2022 9:49:41 PM By: Gretta Cool, BSN, RN, CWS, [name] RN, BSN Entered By: Massie Kluver on 03/08/2022 14:51:19 -------------------------------------------------------------------------------- Pain Assessment Details Patient Name: Date of Service: [name], [name] 03/08/2022 1:45 PM Medical Record Number: [id number] Patient Account Number: [id number] Date of Birth/Sex: Treating RN: 01-Mar-1934 (88 y.o. [name] Primary Care Shoji Pertuit: Denyce Robert Other Clinician: Massie Kluver Referring Yuriana Gaal: Treating Jourdin Gens/Extender: RO BSO N, MICHA EL Serafina Mitchell, Tenika Weeks in Treatment: 2 Active Problems Location of Pain Severity and Description of Pain Patient Has Paino Yes Site Locations Pain Location: Generalized Pain, Pain in Ulcers Duration of the Pain. Constant / Intermittento Intermittent Rate the pain. Current Pain Level: 4 Character of Pain Describe the Pain: Aching Pain Management and Medication Current Pain Management: Medication: Yes Cold Application: No Wisby, Milley L ([id number] Page 7 of 10 Rest: No Massage: No Activity: No T.E.N.S.: No Heat Application: No Leg drop or elevation: No [name] the Current Pain Management Adequate: Inadequate How does your wound impact your  activities of daily livingo Sleep: No Bathing: No Appetite: No Relationship With Others: No Bladder Continence: No Emotions: No Bowel Continence: No Work: No Toileting: No Drive: No Dressing: No Hobbies: No Electronic Signature(s) Signed: 03/10/2022 4:49:21 PM By: Massie Kluver Signed: 03/10/2022 9:49:41 PM By: Gretta Cool, BSN, RN, CWS, [name] RN, BSN Entered By: Massie Kluver on 03/08/2022 14:12:30 -------------------------------------------------------------------------------- Patient/Caregiver Education Details Patient Name: Date of Service: [name], [name] 3/4/2024andnbsp1:45 PM Medical Record Number:  [id number] Patient Account Number: [id number] Date of Birth/Gender: Treating RN: Dec 01, 1934 (88 y.o. [name] Primary Care Physician: Denyce Robert Other Clinician: Massie Kluver Referring Physician: Treating Physician/Extender: RO BSO Delane Ginger, MICHA EL Serafina Mitchell, Johny Drilling in Treatment: 2 Education Assessment Education Provided To: Patient Education Topics Provided Wound/Skin Impairment: Handouts: Other: continue wound care as directed Methods: Explain/Verbal Responses: State content correctly Electronic Signature(s) Signed: 03/10/2022 4:49:21 PM By: Massie Kluver Entered By: Massie Kluver on 03/08/2022 14:51:41 Wound Assessment Details -------------------------------------------------------------------------------- [name] ([id number]) [id number] Page 8 of 10 Patient Name: Date of Service: [place] 03/08/2022 1:45 PM Medical Record Number: [id number] Patient Account Number: [id number] Date of Birth/Sex: Treating RN: 29-Apr-1934 (88 y.o. [name], [name] Primary Care Tequia Wolman: Denyce Robert Other Clinician: Massie Kluver Referring Joshual Terrio: Treating Yui Mulvaney/Extender: RO BSO N, MICHA EL Serafina Mitchell, Tenika Weeks in Treatment: 2 Wound Status Wound Number: 1 Primary Pressure Ulcer Etiology: Wound Location: Distal, Medial Foot Wound Open Wounding Event: Gradually Appeared Status: Date Acquired: 01/22/2022 Comorbid Chronic Obstructive Pulmonary Disease (COPD), Congestive Weeks Of Treatment: 2 History: Heart Failure, Dementia Clustered Wound: No Photos Wound Measurements Length: (cm) 0.5 % Reduction in Area: -66.2% Width: (cm) 0.3 % Reduction in Volume: -150% Depth: (cm) 0.3 Epithelialization: None Area: (cm) 0.118 Undermining: Yes Volume: (cm) 0.035 Starting Position (o'clock): 12 Ending Position (o'clock): 12 Maximum Distance: (cm) 0.4 Wound Description Classification: Category/Stage III Foul Odor After Cleansing:  No Exudate Amount: Medium Slough/Fibrino Yes Exudate Type: Serosanguineous Exudate Color: red, brown Wound Bed Granulation Amount: Small (1-33%) Exposed Structure Granulation Quality: Pink Fascia Exposed: No Necrotic Amount: Large (67-100%) Fat Layer (Subcutaneous Tissue) Exposed: Yes Necrotic Quality: Adherent Slough Tendon Exposed: No Muscle Exposed: No Joint Exposed: No Bone Exposed: No Electronic Signature(s) Signed: 03/10/2022 4:49:21 PM By: Massie Kluver Signed: 03/10/2022 9:49:41 PM By: Gretta Cool, BSN, RN, CWS, [name] RN, BSN Entered By: Massie Kluver on 03/08/2022 14:21:50 [place], Analiyah L ([id number]) [id number] Page 9 of 10 -------------------------------------------------------------------------------- Wound Assessment Details Patient Name: Date of Service: [name], [name] 03/08/2022 1:45 PM Medical Record Number: [id number] Patient Account Number: [id number] Date of Birth/Sex: Treating RN: October 13, 1934 (88 y.o. [name], [name] Primary Care Talis Iwan: Denyce Robert Other Clinician: Massie Kluver Referring Elma Limas: Treating Juliano Mceachin/Extender: RO BSO N, MICHA EL Serafina Mitchell, Tenika Weeks in Treatment: 2 Wound Status Wound Number: 2 Primary Pressure Ulcer Etiology: Wound Location: Right T Fifth oe Wound Open Wounding Event: Blister Status: Date Acquired: 03/02/2022 Comorbid Chronic Obstructive Pulmonary Disease (COPD), Congestive Weeks Of Treatment: 0 History: Heart Failure, Dementia Clustered Wound: No Photos Wound Measurements Length: (cm) 0.1 Width: (cm) 0.1 Depth: (cm) 0.1 Area: (cm) 0.008 Volume: (cm) 0.001 % Reduction in Area: 88.7% % Reduction in Volume: 85.7% Wound Description Classification: Unstageable/Unclassified Wound Margin: Flat and Intact Exudate Amount: Small Exudate Type: Serosanguineous Exudate Color: red, brown Foul Odor After Cleansing: No Slough/Fibrino No Wound Bed Granulation Amount: None Present (0%) Exposed  Structure Necrotic Amount: None Present (0%) Fascia Exposed: Yes Fat Layer (Subcutaneous Tissue) Exposed: Yes Tendon Exposed:  No Muscle Exposed: No Joint Exposed: No Bone Exposed: No Treatment Notes Wound #2 (Toe Fifth) Wound Laterality: Right Cleanser Byram Ancillary Kit - 15 Day Supply Discharge Instruction: Use supplies as instructed; Kit contains: (15) Saline Bullets; (15) 3x3 Gauze; 15 pr Gloves Soap and Water Discharge Instruction: Gently cleanse wound with antibacterial soap, rinse and pat dry prior to dressing wounds Peri-Wound Care Topical Primary Dressing Endoform Natural, Non-fenestrated, 2x2 (in/in) Secondary Dressing (BORDER) Zetuvit Plus SILICONE BORDER Dressing 4x4 (in/in) Discharge Instruction: Please do not put silicone bordered dressings under wraps. Use non-bordered dressing only. [name], [name] ([id number]) [id number] Page 10 of 10 Secured With Compression Wrap Compression Stockings Environmental education officer) Signed: 03/10/2022 4:49:21 PM By: Massie Kluver Signed: 03/10/2022 9:49:41 PM By: Gretta Cool, BSN, RN, CWS, [name] RN, BSN Entered By: Massie Kluver on 03/08/2022 14:22:14 -------------------------------------------------------------------------------- Vitals Details Patient Name: Date of Service: [name], [name] 03/08/2022 1:45 PM Medical Record Number: [id number] Patient Account Number: [id number] Date of Birth/Sex: Treating RN: 08-Nov-1934 (88 y.o. [name], [name] Primary Care Suriyah Vergara: Denyce Robert Other Clinician: Massie Kluver Referring Dontasia Miranda: Treating Philicia Heyne/Extender: RO BSO N, MICHA EL Serafina Mitchell, Tenika Weeks in Treatment: 2 Vital Signs Time Taken: 14:10 Temperature (F): 98.2 Height (in): 62 Pulse (bpm): 80 Weight (lbs): 105 Respiratory Rate (breaths/min): 16 Body Mass Index (BMI): 19.2 Blood Pressure (mmHg): 105/71 Reference Range: 80 - 120 mg / dl Electronic Signature(s) Signed: 03/10/2022 4:49:21 PM  By: Massie Kluver Entered By: Massie Kluver on 03/08/2022 14:12:25

## 2022-03-12 NOTE — Progress Notes (Signed)
[name], [name] ([id number]) [id number] Page 1 of 6 Visit Report for 03/08/2022 Debridement Details Patient Name: Date of Service: [name] 03/08/2022 1:45 PM Medical Record Number: [id number] Patient Account Number: [id number] Date of Birth/Sex: Treating RN: May 13, 1934 (88 y.o. [name] Primary Care Provider: Denyce Robert Other Clinician: Massie Kluver Referring Provider: Treating Provider/Extender: RO BSO N, MICHA EL Serafina Mitchell, Tenika Weeks in Treatment: 2 Debridement Performed for Assessment: Wound #1 Distal,Medial Foot Performed By: Physician Ricard Dillon, MD Debridement Type: Debridement Level of Consciousness (Pre-procedure): Awake and Alert Pre-procedure Verification/Time Out Yes - 14:45 Taken: Start Time: 14:45 T Area Debrided (L x W): otal 0.5 (cm) x 0.3 (cm) = 0.15 (cm) Tissue and other material debrided: Viable, Non-Viable, Slough, Slough Level: Non-Viable Tissue Debridement Description: Selective/Open Wound Instrument: Curette Bleeding: Minimum Hemostasis Achieved: Pressure Response to Treatment: Procedure was tolerated well Level of Consciousness (Post- Awake and Alert procedure): Post Debridement Measurements of Total Wound Length: (cm) 0.5 Stage: Category/Stage III Width: (cm) 0.3 Depth: (cm) 0.3 Volume: (cm) 0.035 Character of Wound/Ulcer Post Debridement: Stable Post Procedure Diagnosis Same as Pre-procedure Electronic Signature(s) Signed: 03/08/2022 4:38:26 PM By: Linton Ham MD Signed: 03/10/2022 4:49:21 PM By: Massie Kluver Signed: 03/10/2022 9:49:41 PM By: Gretta Cool, BSN, RN, CWS, [name] RN, BSN Entered By: Massie Kluver on 03/08/2022 14:48:34 -------------------------------------------------------------------------------- HPI Details Patient Name: Date of Service: [name], [name] 03/08/2022 1:45 PM Medical Record Number: [id number] Patient Account Number: [id number] [name], [name] ([id number])  [id number] Page 2 of 6 Date of Birth/Sex: Treating RN: 10/29/1934 (88 y.o. [name] Primary Care Provider: Other Clinician: Elwin Sleight Referring Provider: Treating Provider/Extender: RO BSO N, MICHA EL Serafina Mitchell, Tenika Weeks in Treatment: 2 History of Present Illness HPI Description: 02-22-2022 upon evaluation today patient appears to be doing somewhat poorly in regard to her left medial first metatarsal head where she does have a bunion. Unfortunately this [name] causing some pressure and has led to a pressure injury at this site. Fortunately I do not see any signs of infection at this point but that [name] something we definitely can need to keep a close eye on. Fortunately there does not appear to be any signs of active infection locally nor systemically at this point. No fevers, chills, nausea, vomiting, or diarrhea. Patient does have a history of of vascular dementia, COPD, congestive heart failure, and the dementia does affect her ability to be able to in some cases follow directions. She [name] very nervous about things in general. This [name] stated to have started somewhere around January 22, 2022. 03-02-2022 upon evaluation today patient appears to be doing well currently in regard to her wounds all things considered. Fortunately I do not see any signs of infection unfortunately both wounds are still open and the one on the medial foot first metatarsal location [name] still the worst. Although the area over the fifth toe right foot actually [name] also open at this point unfortunately. With that being said I do think we can need to continue to monitor for any signs of worsening or infection although right now I think she [name] doing okay in that regard. 3/4; this [name] a patient with a bunion wound on the left medial foot. She also had a small blister on the right lateral fifth toe. She went for x-rays last week no acute fracture or dislocation was seen on the  left side she had worsened moderate hallucis valgus and moderate second and third fourth  fifth tarsometatarsal osteoarthritis. We use Prisma. Electronic Signature(s) Signed: 03/08/2022 4:38:26 PM By: Linton Ham MD Entered By: Linton Ham on 03/08/2022 15:15:55 -------------------------------------------------------------------------------- Physical Exam Details Patient Name: Date of Service: [name], [name] 03/08/2022 1:45 PM Medical Record Number: [id number] Patient Account Number: [id number] Date of Birth/Sex: Treating RN: [birth year] (88 y.o. [name] Primary Care Provider: Denyce Robert Other Clinician: Massie Kluver Referring Provider: Treating Provider/Extender: RO BSO N, MICHA EL Serafina Mitchell, Tenika Weeks in Treatment: 2 Constitutional Sitting or standing Blood Pressure [name] within target range for patient.. Pulse regular and within target range for patient.Marland Kitchen Respirations regular, non-labored and within target range.. Temperature [name] normal and within the target range for the patient.Marland Kitchen appears in no distress. Notes Wound exam; the patient's major wound over the first medial bunion deformity [name] open and still with some depth. There [name] no surrounding infection. The afterthought wound on the right lateral fifth toe [name] closed Electronic Signature(s) Signed: 03/08/2022 4:38:26 PM By: Linton Ham MD Entered By: Linton Ham on 03/08/2022 14:55:44 [place], [place] ([id number]) [id number] Page 3 of 6 -------------------------------------------------------------------------------- Physician Orders Details Patient Name: Date of Service: [name] 03/08/2022 1:45 PM Medical Record Number: [id number] Patient Account Number: [id number] Date of Birth/Sex: Treating RN: April 21, 1934 (88 y.o. [name] Primary Care Provider: Denyce Robert Other Clinician: Massie Kluver Referring Provider: Treating Provider/Extender: RO BSO N, MICHA EL  Serafina Mitchell, Tenika Weeks in Treatment: 2 Verbal / Phone Orders: No Diagnosis Coding Follow-up Appointments Return Appointment in 1 week. Bathing/ [employer] wounds with antibacterial soap and water. Anesthetic (Use 'Patient Medications' Section for Anesthetic Order Entry) Lidocaine applied to wound bed Edema Control - Lymphedema / Segmental Compressive Device / Other Elevate, Exercise Daily and A void Standing for Long Periods of Time. Elevate legs to the level of the heart and pump ankles as often as possible Elevate leg(s) parallel to the floor when sitting. Wound Treatment Wound #1 - Foot Wound Laterality: Medial, Distal Cleanser: Byram Ancillary Kit - 15 Day Supply (Generic) 3 x Per Week/30 Days Discharge Instructions: Use supplies as instructed; Kit contains: (15) Saline Bullets; (15) 3x3 Gauze; 15 pr Gloves Cleanser: Soap and Water 3 x Per Week/30 Days Discharge Instructions: Gently cleanse wound with antibacterial soap, rinse and pat dry prior to dressing wounds Prim Dressing: Endoform Natural, Non-fenestrated, 2x2 (in/in) ary 3 x Per Week/30 Days Secondary Dressing: (BORDER) Zetuvit Plus SILICONE BORDER Dressing 4x4 (in/in) (Generic) 3 x Per Week/30 Days Discharge Instructions: Please do not put silicone bordered dressings under wraps. Use non-bordered dressing only. Wound #2 - T Fifth oe Wound Laterality: Right Cleanser: Byram Ancillary Kit - 15 Day Supply (Generic) 3 x Per Week/30 Days Discharge Instructions: Use supplies as instructed; Kit contains: (15) Saline Bullets; (15) 3x3 Gauze; 15 pr Gloves Cleanser: Soap and Water 3 x Per Week/30 Days Discharge Instructions: Gently cleanse wound with antibacterial soap, rinse and pat dry prior to dressing wounds Prim Dressing: Endoform Natural, Non-fenestrated, 2x2 (in/in) ary 3 x Per Week/30 Days Secondary Dressing: (BORDER) Zetuvit Plus SILICONE BORDER Dressing 4x4 (in/in) (Generic) 3 x Per Week/30 Days Discharge  Instructions: Please do not put silicone bordered dressings under wraps. Use non-bordered dressing only. Electronic Signature(s) Signed: 03/08/2022 4:38:26 PM By: Linton Ham MD Signed: 03/10/2022 4:49:21 PM By: Massie Kluver Entered By: Massie Kluver on 03/08/2022 14:50:57 -------------------------------------------------------------------------------- Problem List Details Patient Name: Date of Service: [name], [name] 03/08/2022 1:45 PM Medical Record Number: [id number] Patient Account  Number: [id number] [name], [name] ([id number]) [id number] Page 4 of 6 Date of Birth/Sex: Treating RN: 1934/09/13 (88 y.o. [name] Primary Care Provider: Other Clinician: Elwin Sleight Referring Provider: Treating Provider/Extender: RO BSO N, MICHA EL Serafina Mitchell, Tenika Weeks in Treatment: 2 Active Problems ICD-10 Encounter Code Description Active Date MDM Diagnosis L89.893 Pressure ulcer of other site, stage 3 02/22/2022 No Yes I50.42 Chronic combined systolic (congestive) and diastolic (congestive) heart failure 02/22/2022 No Yes F01.511 Vascular dementia, unspecified severity, with agitation 02/22/2022 No Yes J44.9 Chronic obstructive pulmonary disease, unspecified 02/22/2022 No Yes Inactive Problems Resolved Problems Electronic Signature(s) Signed: 03/08/2022 4:38:26 PM By: Linton Ham MD Entered By: Linton Ham on 03/08/2022 14:54:18 -------------------------------------------------------------------------------- Progress Note Details Patient Name: Date of Service: [name], [name] 03/08/2022 1:45 PM Medical Record Number: [id number] Patient Account Number: [id number] Date of Birth/Sex: Treating RN: 23-May-1934 (88 y.o. [name] Primary Care Provider: Denyce Robert Other Clinician: Massie Kluver Referring Provider: Treating Provider/Extender: RO BSO N, MICHA EL Serafina Mitchell, Tenika Weeks in Treatment:  2 Subjective Objective Constitutional Sitting or standing Blood Pressure [name] within target range for patient.. Pulse regular and within target range for patient.Marland Kitchen Respirations regular, non-labored and within target range.. Temperature [name] normal and within the target range for the patient.Marland Kitchen appears in no distress. Vitals Time Taken: 2:10 PM, Height: 62 in, Weight: 105 lbs, BMI: 19.2, Temperature: 98.2 F, Pulse: 80 bpm, Respiratory Rate: 16 breaths/min, Blood Pressure: 105/71 mmHg. [name], [name] ([id number]) [id number] Page 5 of 6 General Notes: Wound exam; the patient's major wound over the first medial bunion deformity [name] open and still with some depth. There [name] no surrounding infection. The afterthought wound on the right lateral fifth toe [name] closed Integumentary (Hair, Skin) Wound #1 status [name] Open. Original cause of wound was Gradually Appeared. The date acquired was: 01/22/2022. The wound has been in treatment 2 weeks. The wound [name] located on the [place]. The wound measures 0.5cm length x 0.3cm width x 0.3cm depth; 0.118cm^2 area and 0.035cm^3 volume. There [name] Fat Layer (Subcutaneous Tissue) exposed. There [name] undermining starting at 12:00 and ending at 12:00 with a maximum distance of 0.4cm. There [name] a medium amount of serosanguineous drainage noted. There [name] small (1-33%) pink granulation within the wound bed. There [name] a large (67-100%) amount of necrotic tissue within the wound bed including Adherent Slough. Wound #2 status [name] Open. Original cause of wound was Blister. The date acquired was: 03/02/2022. The wound [name] located on the Right T Fifth. The wound oe measures 0.1cm length x 0.1cm width x 0.1cm depth; 0.008cm^2 area and 0.001cm^3 volume. There [name] Fat Layer (Subcutaneous Tissue) and fascia exposed. There [name] a small amount of serosanguineous drainage noted. The wound margin [name] flat and intact. There [name] no granulation within the wound bed. There [name]  no necrotic tissue within the wound bed. Assessment Active Problems ICD-10 Pressure ulcer of other site, stage 3 Chronic combined systolic (congestive) and diastolic (congestive) heart failure Vascular dementia, unspecified severity, with agitation Chronic obstructive pulmonary disease, unspecified Procedures Wound #1 Pre-procedure diagnosis of Wound #1 [name] a Pressure Ulcer located on the Distal,Medial Foot . There was a Selective/Open Wound Non-Viable Tissue Debridement with a total area of 0.15 sq cm performed by Ricard Dillon, MD. With the following instrument(s): Curette to remove Viable and Non- Viable tissue/material. Material removed includes [place]. A time out was conducted at 14:45, prior to the start of the procedure. A Minimum amount of  bleeding was controlled with Pressure. The procedure was tolerated well. Post Debridement Measurements: 0.5cm length x 0.3cm width x 0.3cm depth; 0.035cm^3 volume. Post debridement Stage noted as Category/Stage III. Character of Wound/Ulcer Post Debridement [name] stable. Post procedure Diagnosis Wound #1: Same as Pre-Procedure Plan Follow-up Appointments: Return Appointment in 1 week. Bathing/ Shower/ Hygiene: Wash wounds with antibacterial soap and water. Anesthetic (Use 'Patient Medications' Section for Anesthetic Order Entry): Lidocaine applied to wound bed Edema Control - Lymphedema / Segmental Compressive Device / Other: Elevate, Exercise Daily and Avoid Standing for Long Periods of Time. Elevate legs to the level of the heart and pump ankles as often as possible Elevate leg(s) parallel to the floor when sitting. WOUND #1: - Foot Wound Laterality: Medial, Distal Cleanser: Byram Ancillary Kit - 15 Day Supply (Generic) 3 x Per Week/30 Days Discharge Instructions: Use supplies as instructed; Kit contains: (15) Saline Bullets; (15) 3x3 Gauze; 15 pr Gloves Cleanser: Soap and Water 3 x Per Week/30 Days Discharge Instructions: Gently cleanse  wound with antibacterial soap, rinse and pat dry prior to dressing wounds Prim Dressing: Endoform Natural, Non-fenestrated, 2x2 (in/in) 3 x Per Week/30 Days ary Secondary Dressing: (BORDER) Zetuvit Plus SILICONE BORDER Dressing 4x4 (in/in) (Generic) 3 x Per Week/30 Days Discharge Instructions: Please do not put silicone bordered dressings under wraps. Use non-bordered dressing only. WOUND #2: - T Fifth Wound Laterality: Right oe Cleanser: Byram Ancillary Kit - 15 Day Supply (Generic) 3 x Per Week/30 Days Discharge Instructions: Use supplies as instructed; Kit contains: (15) Saline Bullets; (15) 3x3 Gauze; 15 pr Gloves Cleanser: Soap and Water 3 x Per Week/30 Days Discharge Instructions: Gently cleanse wound with antibacterial soap, rinse and pat dry prior to dressing wounds Prim Dressing: Endoform Natural, Non-fenestrated, 2x2 (in/in) 3 x Per Week/30 Days ary Secondary Dressing: (BORDER) Zetuvit Plus SILICONE BORDER Dressing 4x4 (in/in) (Generic) 3 x Per Week/30 Days Discharge Instructions: Please do not put silicone bordered dressings under wraps. Use non-bordered dressing only. 1. I have changed the primary dressing on the left first metatarsal head endoform. 2. The area on the right fifth metatarsal head has closed. 3. I talked to the patient and her daughter about trying to keep her toes separated. I am not optimistic about offloading some of these areas [name], [name] ([id number]) [id number] Page 6 of 6 Electronic Signature(s) Signed: 03/08/2022 4:38:26 PM By: Linton Ham MD Entered By: Linton Ham on 03/08/2022 14:57:13 -------------------------------------------------------------------------------- SuperBill Details Patient Name: Date of Service: [name], [name] 03/08/2022 Medical Record Number: [id number] Patient Account Number: [id number] Date of Birth/Sex: Treating RN: 10-31-1934 (88 y.o. [name] Primary Care Provider: Denyce Robert Other Clinician: Massie Kluver Referring Provider: Treating Provider/Extender: RO BSO N, MICHA EL Serafina Mitchell, Tenika Weeks in Treatment: 2 Diagnosis Coding ICD-10 Codes Code Description [phone] Pressure ulcer of other site, stage 3 I50.42 Chronic combined systolic (congestive) and diastolic (congestive) heart failure F01.511 Vascular dementia, unspecified severity, with agitation J44.9 Chronic obstructive pulmonary disease, unspecified Facility Procedures : CPT4 Code: [id number] Description: [id number] - DEBRIDE WOUND 1ST 20 SQ CM OR < ICD-10 Diagnosis Description L89.893 Pressure ulcer of other site, stage 3 Modifier: Quantity: 1 Physician Procedures : CPT4 Code Description Modifier [id number] - WC PHYS DEBR WO ANESTH 20 SQ CM ICD-10 Diagnosis Description L89.893 Pressure ulcer of other site, stage 3 Quantity: 1 Electronic Signature(s) Signed: 03/08/2022 4:38:26 PM By: Linton Ham MD Entered By: Linton Ham on 03/08/2022 14:57:36

## 2022-03-15 ENCOUNTER — Encounter (HOSPITAL_BASED_OUTPATIENT_CLINIC_OR_DEPARTMENT_OTHER): Payer: Medicare Other | Admitting: Internal Medicine

## 2022-03-15 DIAGNOSIS — J449 Chronic obstructive pulmonary disease, unspecified: Secondary | ICD-10-CM | POA: Diagnosis not present

## 2022-03-15 DIAGNOSIS — L89893 Pressure ulcer of other site, stage 3: Secondary | ICD-10-CM

## 2022-03-15 DIAGNOSIS — F01511 Vascular dementia, unspecified severity, with agitation: Secondary | ICD-10-CM | POA: Diagnosis not present

## 2022-03-15 DIAGNOSIS — I5042 Chronic combined systolic (congestive) and diastolic (congestive) heart failure: Secondary | ICD-10-CM | POA: Diagnosis not present

## 2022-03-15 NOTE — Progress Notes (Signed)
SANYI, BOARD ([id number]) [id number] Page 1 of 6 Visit Report for 03/15/2022 Chief Complaint Document Details Patient Name: Date of Service: [place] 03/15/2022 9:30 A M Medical Record Number: [id number] Patient Account Number: [id number] Date of Birth/Sex: Treating RN: 10/07/1934 (88 y.o. [name] Primary Care Provider: Denyce Robert Other Clinician: Massie Kluver Referring Provider: Treating Provider/Extender: Janene Madeira Weeks in Treatment: 3 Information Obtained from: Patient Chief Complaint Left foot pressure ulcer Electronic Signature(s) Signed: 03/15/2022 1:05:58 PM By: Kalman Shan DO Entered By: Kalman Shan on 03/15/2022 10:01:55 -------------------------------------------------------------------------------- HPI Details Patient Name: Date of Service: [name], [place] 03/15/2022 9:30 A M Medical Record Number: [id number] Patient Account Number: [id number] Date of Birth/Sex: Treating RN: [birth year] (88 y.o. [name] Primary Care Provider: Denyce Robert Other Clinician: Massie Kluver Referring Provider: Treating Provider/Extender: Janene Madeira Weeks in Treatment: 3 History of Present Illness HPI Description: 02-22-2022 upon evaluation today patient appears to be doing somewhat poorly in regard to her left medial first metatarsal head where she does have a bunion. Unfortunately this is causing some pressure and has led to a pressure injury at this site. Fortunately I do not see any signs of infection at this point but that is something we definitely can need to keep a close eye on. Fortunately there does not appear to be any signs of active infection locally nor systemically at this point. No fevers, chills, nausea, vomiting, or diarrhea. Patient does have a history of of vascular dementia, COPD, congestive heart failure, and the dementia does affect her ability to be able  to in some cases follow directions. She is very nervous about things in general. This is stated to have started somewhere around January 22, 2022. 03-02-2022 upon evaluation today patient appears to be doing well currently in regard to her wounds all things considered. Fortunately I do not see any signs of infection unfortunately both wounds are still open and the one on the medial foot first metatarsal location is still the worst. Although the area over the fifth toe right foot actually is also open at this point unfortunately. With that being said I do think we can need to continue to monitor for any signs of worsening or infection although right now I think she is doing okay in that regard. 3/4; this is a patient with a bunion wound on the left medial foot. She also had a small blister on the right lateral fifth toe. She went for x-rays last week no acute fracture or dislocation was seen on the left side she had worsened moderate hallucis valgus and moderate second and third fourth fifth tarsometatarsal osteoarthritis. We use Prisma. 3/11; patient presents for follow-up. She has been using endoform to the left medial foot wound. She has cushion pads that she uses in between the toes to [place], [place] ([id number]) [id number] Page 2 of 6 help offload the areas and prevent future wounds. The right lateral fifth met head remains closed. Electronic Signature(s) Signed: 03/15/2022 1:05:58 PM By: Kalman Shan DO Entered By: Kalman Shan on 03/15/2022 10:02:54 -------------------------------------------------------------------------------- Physical Exam Details Patient Name: Date of Service: [name], Cristen L. 03/15/2022 9:30 A M Medical Record Number: [id number] Patient Account Number: [id number] Date of Birth/Sex: Treating RN: April 10, 1934 (88 y.o. [name] Primary Care Provider: Denyce Robert Other Clinician: Massie Kluver Referring Provider: Treating  Provider/Extender: Janene Madeira Weeks in Treatment: 3 Constitutional . Cardiovascular . Psychiatric . Notes T the  left first medial bunion deformity there is an open wound with granulation tissue and nonviable tissue. Undermining noted. No surrounding signs of o infection. T the fifth right lateral toe there is epithelization T the previous wound site. o o Engineer, maintenance) Signed: 03/15/2022 1:05:58 PM By: Kalman Shan DO Entered By: Kalman Shan on 03/15/2022 10:04:29 -------------------------------------------------------------------------------- Physician Orders Details Patient Name: Date of Service: [name], Ilena L. 03/15/2022 9:30 A M Medical Record Number: [id number] Patient Account Number: [id number] Date of Birth/Sex: Treating RN: 1934/12/20 (88 y.o. [name] Primary Care Provider: Denyce Robert Other Clinician: Massie Kluver Referring Provider: Treating Provider/Extender: Janene Madeira Weeks in Treatment: 3 Verbal / Phone Orders: No Diagnosis Coding Follow-up Appointments Return Appointment in 1 week. SHARILEE, LOUPE ([id number]) [id number] Page 3 of 6 Bathing/ [employer] wounds with antibacterial soap and water. Anesthetic (Use 'Patient Medications' Section for Anesthetic Order Entry) Lidocaine applied to wound bed Edema Control - Lymphedema / Segmental Compressive Device / Other Elevate, Exercise Daily and A void Standing for Long Periods of Time. Elevate legs to the level of the heart and pump ankles as often as possible Elevate leg(s) parallel to the floor when sitting. Wound Treatment Wound #1 - Foot Wound Laterality: Left, Medial, Distal Cleanser: Byram Ancillary Kit - 15 Day Supply (Generic) 3 x Per Week/30 Days Discharge Instructions: Use supplies as instructed; Kit contains: (15) Saline Bullets; (15) 3x3 Gauze; 15 pr Gloves Cleanser: Soap and Water 3 x  Per Week/30 Days Discharge Instructions: Gently cleanse wound with antibacterial soap, rinse and pat dry prior to dressing wounds Prim Dressing: Endoform Natural, Non-fenestrated, 2x2 (in/in) ary 3 x Per Week/30 Days Secondary Dressing: (BORDER) Zetuvit Plus SILICONE BORDER Dressing 4x4 (in/in) (Generic) 3 x Per Week/30 Days Discharge Instructions: Please do not put silicone bordered dressings under wraps. Use non-bordered dressing only. Electronic Signature(s) Signed: 03/15/2022 1:05:58 PM By: Kalman Shan DO Entered By: Kalman Shan on 03/15/2022 10:26:40 -------------------------------------------------------------------------------- Problem List Details Patient Name: Date of Service: [name], Quatisha L. 03/15/2022 9:30 A M Medical Record Number: [id number] Patient Account Number: [id number] Date of Birth/Sex: Treating RN: 14-Jan-1934 (88 y.o. [name] Primary Care Provider: Denyce Robert Other Clinician: Massie Kluver Referring Provider: Treating Provider/Extender: Janene Madeira Weeks in Treatment: 3 Active Problems ICD-10 Encounter Code Description Active Date MDM Diagnosis [phone] Pressure ulcer of other site, stage 3 02/22/2022 No Yes I50.42 Chronic combined systolic (congestive) and diastolic (congestive) heart failure 02/22/2022 No Yes F01.511 Vascular dementia, unspecified severity, with agitation 02/22/2022 No Yes J44.9 Chronic obstructive pulmonary disease, unspecified 02/22/2022 No Yes Stieber, Clifford L ([id number]) [id number] Page 4 of 6 Inactive Problems Resolved Problems Electronic Signature(s) Signed: 03/15/2022 1:05:58 PM By: Kalman Shan DO Entered By: Kalman Shan on 03/15/2022 10:01:52 -------------------------------------------------------------------------------- Progress Note Details Patient Name: Date of Service: [name], Demara L. 03/15/2022 9:30 A M Medical Record Number: [id number] Patient  Account Number: [id number] Date of Birth/Sex: Treating RN: 06/01/1934 (88 y.o. [name] Primary Care Provider: Denyce Robert Other Clinician: Massie Kluver Referring Provider: Treating Provider/Extender: Janene Madeira Weeks in Treatment: 3 Subjective Chief Complaint Information obtained from Patient Left foot pressure ulcer History of Present Illness (HPI) 02-22-2022 upon evaluation today patient appears to be doing somewhat poorly in regard to her left medial first metatarsal head where she does have a bunion. Unfortunately this is causing some pressure and has led to a pressure injury at this site. Fortunately I do not see any  signs of infection at this point but that is something we definitely can need to keep a close eye on. Fortunately there does not appear to be any signs of active infection locally nor systemically at this point. No fevers, chills, nausea, vomiting, or diarrhea. Patient does have a history of of vascular dementia, COPD, congestive heart failure, and the dementia does affect her ability to be able to in some cases follow directions. She is very nervous about things in general. This is stated to have started somewhere around January 22, 2022. 03-02-2022 upon evaluation today patient appears to be doing well currently in regard to her wounds all things considered. Fortunately I do not see any signs of infection unfortunately both wounds are still open and the one on the medial foot first metatarsal location is still the worst. Although the area over the fifth toe right foot actually is also open at this point unfortunately. With that being said I do think we can need to continue to monitor for any signs of worsening or infection although right now I think she is doing okay in that regard. 3/4; this is a patient with a bunion wound on the left medial foot. She also had a small blister on the right lateral fifth toe. She went for x-rays last week  no acute fracture or dislocation was seen on the left side she had worsened moderate hallucis valgus and moderate second and third fourth fifth tarsometatarsal osteoarthritis. We use Prisma. 3/11; patient presents for follow-up. She has been using endoform to the left medial foot wound. She has cushion pads that she uses in between the toes to help offload the areas and prevent future wounds. The right lateral fifth met head remains closed. Objective Constitutional Vitals Time Taken: 9:35 AM, Height: 62 in, Weight: 105 lbs, BMI: 19.2, Temperature: 97.4 F, Pulse: 81 bpm, Respiratory Rate: 16 breaths/min, Blood Pressure: 116/78 mmHg. General Notes: T the left first medial bunion deformity there is an open wound with granulation tissue and nonviable tissue. Undermining noted. No surrounding o signs of infection. T the fifth right lateral toe there is epithelization T the previous wound site. o o Integumentary (Hair, Skin) Wound #1 status is Open. Original cause of wound was Gradually Appeared. The date acquired was: 01/22/2022. The wound has been in treatment 3 weeks. The wound is located on the Left,Distal,Medial Foot. The wound measures 0.5cm length x 0.4cm width x 0.4cm depth; 0.157cm^2 area and 0.063cm^3 volume. There is Fat Layer (Subcutaneous Tissue) exposed. There is undermining starting at 4:00 and ending at 10:00 with a maximum distance of 0.4cm. There is a medium amount of serosanguineous drainage noted. There is small (1-33%) pink granulation within the wound bed. There is a large (67-100%) amount of Mittal, Whitnie L ([id number]) [id number] Page 5 of 6 necrotic tissue within the wound bed including Adherent Slough. Wound #2 status is Open. Original cause of wound was Blister. The date acquired was: 03/02/2022. The wound has been in treatment 1 weeks. The wound is located on the Right T Fifth. The wound measures 0cm length x 0cm width x 0cm depth; 0cm^2 area and  0cm^3 volume. There is Fat Layer (Subcutaneous oe Tissue) and fascia exposed. There is a none present amount of drainage noted. The wound margin is flat and intact. There is no granulation within the wound bed. There is no necrotic tissue within the wound bed. Assessment Active Problems ICD-10 Pressure ulcer of other site, stage 3 Chronic combined systolic (congestive) and diastolic (congestive)  heart failure Vascular dementia, unspecified severity, with agitation Chronic obstructive pulmonary disease, unspecified Patient's left medial foot wound is stable. I recommended continuing endoform here. She can use lambswool in between toes to help with cushioning. She has also purchased toe cushions That look like a good option as well to pad the toes. Plan Follow-up Appointments: Return Appointment in 1 week. Bathing/ Shower/ Hygiene: Wash wounds with antibacterial soap and water. Anesthetic (Use 'Patient Medications' Section for Anesthetic Order Entry): Lidocaine applied to wound bed Edema Control - Lymphedema / Segmental Compressive Device / Other: Elevate, Exercise Daily and Avoid Standing for Long Periods of Time. Elevate legs to the level of the heart and pump ankles as often as possible Elevate leg(s) parallel to the floor when sitting. WOUND #1: - Foot Wound Laterality: Left, Medial, Distal Cleanser: Byram Ancillary Kit - 15 Day Supply (Generic) 3 x Per Week/30 Days Discharge Instructions: Use supplies as instructed; Kit contains: (15) Saline Bullets; (15) 3x3 Gauze; 15 pr Gloves Cleanser: Soap and Water 3 x Per Week/30 Days Discharge Instructions: Gently cleanse wound with antibacterial soap, rinse and pat dry prior to dressing wounds Prim Dressing: Endoform Natural, Non-fenestrated, 2x2 (in/in) 3 x Per Week/30 Days ary Secondary Dressing: (BORDER) Zetuvit Plus SILICONE BORDER Dressing 4x4 (in/in) (Generic) 3 x Per Week/30 Days Discharge Instructions: Please do not put silicone  bordered dressings under wraps. Use non-bordered dressing only. 1. Endoform 2. Aggressive offloading 3. Follow-up in 1 week Electronic Signature(s) Signed: 03/15/2022 1:05:58 PM By: Kalman Shan DO Entered By: Kalman Shan on 03/15/2022 10:26:25 -------------------------------------------------------------------------------- SuperBill Details Patient Name: Date of Service: [name], Dylynn L. 03/15/2022 Medical Record Number: [id number] Patient Account Number: [id number] Date of Birth/Sex: Treating RN: Jun 08, 1934 (88 y.o. [name] Primary Care Provider: Denyce Robert Other Clinician: Massie Kluver Referring Provider: Treating Provider/Extender: Janene Madeira Weeks in Treatment: [place], [place] ([id number]) [id number] Page 6 of 6 Diagnosis Coding ICD-10 Codes Code Description [phone] Pressure ulcer of other site, stage 3 I50.42 Chronic combined systolic (congestive) and diastolic (congestive) heart failure F01.511 Vascular dementia, unspecified severity, with agitation J44.9 Chronic obstructive pulmonary disease, unspecified Facility Procedures : CPT4 Code: [id number] Description: 99213 - WOUND CARE VISIT-LEV 3 EST PT Modifier: Quantity: 1 Physician Procedures : CPT4 Code Description Modifier [id number] - WC PHYS LEVEL 3 - EST PT ICD-10 Diagnosis Description L89.893 Pressure ulcer of other site, stage 3 I50.42 Chronic combined systolic (congestive) and diastolic (congestive) heart failure F01.511 Vascular  dementia, unspecified severity, with agitation J44.9 Chronic obstructive pulmonary disease, unspecified Quantity: 1 Electronic Signature(s) Signed: 03/15/2022 1:05:58 PM By: Kalman Shan DO Entered By: Kalman Shan on 03/15/2022 10:26:34

## 2022-03-16 NOTE — Progress Notes (Signed)
Lacey, [name] ([id number]) [id number] Page 1 of 11 Visit Report for 03/15/2022 Arrival Information Details Patient Name: Date of Service: [name] 03/15/2022 9:30 A M Medical Record Number: [id number] Patient Account Number: [id number] Date of Birth/Sex: Treating RN: 16-Aug-1934 (88 y.o. [name], [name] Primary Care [name]: Denyce Robert Other Clinician: Massie Kluver Referring [name]: Treating [name]/Extender: Janene Madeira Weeks in Treatment: 3 Visit Information History Since Last Visit All ordered tests and consults were completed: No Patient Arrived: Wheel Chair Added or deleted any medications: No Arrival Time: 09:27 Any new allergies or adverse reactions: No Transfer Assistance: None Had a fall or experienced change in No Patient Requires Transmission-Based Precautions: No activities of daily living that may affect Patient Has Alerts: No risk of falls: Signs or symptoms of abuse/neglect since last visito No Hospitalized since last visit: No Implantable device outside of the clinic excluding No cellular tissue based products placed in the center since last visit: Has Dressing in Place as Prescribed: Yes Pain Present Now: Yes Electronic Signature(s) Signed: 03/16/2022 9:45:50 AM By: Massie Kluver Entered By: Massie Kluver on 03/15/2022 09:34:46 -------------------------------------------------------------------------------- Clinic Level of Care Assessment Details Patient Name: Date of Service: [name], [name]. 03/15/2022 9:30 A M Medical Record Number: [id number] Patient Account Number: [id number] Date of Birth/Sex: Treating RN: 1934-04-29 (88 y.o. [name] Primary Care [name]: Denyce Robert Other Clinician: Massie Kluver Referring [name]: Treating [name]/Extender: Janene Madeira Weeks in Treatment: 3 Clinic Level of Care Assessment Items TOOL 4 Quantity Score '[]'$  - 0 Use  when only an EandM [name] performed on FOLLOW-UP visit ASSESSMENTS - Nursing Assessment / Reassessment X- 1 10 Reassessment of Co-morbidities (includes updates in patient status) X- 1 5 Reassessment of Adherence to Treatment Plan [name], [name] ([id number]) [id number] Page 2 of 11 ASSESSMENTS - Wound and Skin A ssessment / Reassessment '[]'$  - 0 Simple Wound Assessment / Reassessment - one wound X- 2 5 Complex Wound Assessment / Reassessment - multiple wounds '[]'$  - 0 Dermatologic / Skin Assessment (not related to wound area) ASSESSMENTS - Focused Assessment '[]'$  - 0 Circumferential Edema Measurements - multi extremities '[]'$  - 0 Nutritional Assessment / Counseling / Intervention '[]'$  - 0 Lower Extremity Assessment (monofilament, tuning fork, pulses) '[]'$  - 0 Peripheral Arterial Disease Assessment (using hand held doppler) ASSESSMENTS - Ostomy and/or Continence Assessment and Care '[]'$  - 0 Incontinence Assessment and Management '[]'$  - 0 Ostomy Care Assessment and Management (repouching, etc.) PROCESS - Coordination of Care X - Simple Patient / Family Education for ongoing care 1 15 '[]'$  - 0 Complex (extensive) Patient / Family Education for ongoing care '[]'$  - 0 Staff obtains Programmer, systems, Records, T Results / Process Orders est '[]'$  - 0 Staff telephones HHA, Nursing Homes / Clarify orders / etc '[]'$  - 0 Routine Transfer to another Facility (non-emergent condition) '[]'$  - 0 Routine Hospital Admission (non-emergent condition) '[]'$  - 0 New Admissions / Biomedical engineer / Ordering NPWT Apligraf, etc. , '[]'$  - 0 Emergency Hospital Admission (emergent condition) X- 1 10 Simple Discharge Coordination '[]'$  - 0 Complex (extensive) Discharge Coordination PROCESS - Special Needs '[]'$  - 0 Pediatric / Minor Patient Management '[]'$  - 0 Isolation Patient Management '[]'$  - 0 Hearing / Language / Visual special needs '[]'$  - 0 Assessment of Community assistance (transportation, D/C  planning, etc.) '[]'$  - 0 Additional assistance / Altered mentation '[]'$  - 0 Support Surface(s) Assessment (bed, cushion, seat, etc.) INTERVENTIONS - Wound Cleansing / Measurement '[]'$  - 0  Simple Wound Cleansing - one wound X- 2 5 Complex Wound Cleansing - multiple wounds X- 1 5 Wound Imaging (photographs - any number of wounds) '[]'$  - 0 Wound Tracing (instead of photographs) X- 1 5 Simple Wound Measurement - one wound '[]'$  - 0 Complex Wound Measurement - multiple wounds INTERVENTIONS - Wound Dressings '[]'$  - 0 Small Wound Dressing one or multiple wounds X- 1 15 Medium Wound Dressing one or multiple wounds '[]'$  - 0 Large Wound Dressing one or multiple wounds '[]'$  - 0 Application of Medications - topical '[]'$  - 0 Application of Medications - injection INTERVENTIONS - Miscellaneous '[]'$  - 0 External ear exam Dedmon, [name] ([id number]) [id number] Page 3 of 11 '[]'$  - 0 Specimen Collection (cultures, biopsies, blood, body fluids, etc.) '[]'$  - 0 Specimen(s) / Culture(s) sent or taken to Lab for analysis '[]'$  - 0 Patient Transfer (multiple staff / Harrel Lemon Lift / Similar devices) '[]'$  - 0 Simple Staple / Suture removal (25 or less) '[]'$  - 0 Complex Staple / Suture removal (26 or more) '[]'$  - 0 Hypo / Hyperglycemic Management (close monitor of Blood Glucose) '[]'$  - 0 Ankle / Brachial Index (ABI) - do not check if billed separately X- 1 5 Vital Signs Has the patient been seen at the hospital within the last three years: Yes Total Score: 90 Level Of Care: New/Established - Level 3 Electronic Signature(s) Signed: 03/16/2022 9:45:50 AM By: Massie Kluver Entered By: Massie Kluver on 03/15/2022 10:01:40 -------------------------------------------------------------------------------- Encounter Discharge Information Details Patient Name: Date of Service: [name], [name]. 03/15/2022 9:30 A M Medical Record Number: [id number] Patient Account Number: [id number] Date of Birth/Sex:  Treating RN: Oct 11, 1934 (88 y.o. [name], [name] Primary Care Kaitlen Redford: Denyce Robert Other Clinician: Massie Kluver Referring Raseel Jans: Treating Amahia Madonia/Extender: Berna Spare in Treatment: 3 Encounter Discharge Information Items Discharge Condition: Stable Ambulatory Status: Wheelchair Discharge Destination: Home Transportation: Private Auto Accompanied By: daughter Schedule Follow-up Appointment: Yes Clinical Summary of Care: Electronic Signature(s) Signed: 03/16/2022 9:45:50 AM By: Massie Kluver Entered By: Massie Kluver on 03/15/2022 10:16:02 Lower Extremity Assessment Details -------------------------------------------------------------------------------- [name] ([id number]) [id number] Page 4 of 11 Patient Name: Date of Service: [place] 03/15/2022 9:30 A M Medical Record Number: [id number] Patient Account Number: [id number] Date of Birth/Sex: Treating RN: 1934/09/22 (88 y.o. [name] Primary Care Mairyn Lenahan: Denyce Robert Other Clinician: Massie Kluver Referring Katori Wirsing: Treating Keimari /Extender: Janene Madeira Weeks in Treatment: 3 Edema Assessment Left: Right: Assessed: Yes Yes Edema: Yes Yes Calf Left: Right: Point of Measurement: 32 cm From Medial Instep 28 cm 28.5 cm Ankle Left: Right: Point of Measurement: 10 cm From Medial Instep 20.2 cm 20.6 cm Vascular Assessment Left: Right: Pulses: Dorsalis Pedis Palpable: Yes Yes Electronic Signature(s) Signed: 03/15/2022 10:41:35 AM By: Gretta Cool, BSN, RN, CWS, [name] RN, BSN Signed: 03/16/2022 9:45:50 AM By: Massie Kluver Entered By: Massie Kluver on 03/15/2022 09:52:00 -------------------------------------------------------------------------------- Multi Wound Chart Details Patient Name: Date of Service: [name], Shantrell [name]. 03/15/2022 9:30 A M Medical Record Number: [id number] Patient Account Number: [id number] Date of  Birth/Sex: Treating RN: March 22, 1934 (88 y.o. [name] Primary Care Saima Monterroso: Denyce Robert Other Clinician: Massie Kluver Referring Latravion Graves: Treating Pria Klosinski/Extender: Janene Madeira Weeks in Treatment: 3 Vital Signs Height(in): 62 Pulse(bpm): 81 Weight(lbs): 105 Blood Pressure(mmHg): 116/78 Body Mass Index(BMI): 19.2 Temperature(F): 97.4 Respiratory Rate(breaths/min): 16 [1:Photos:] [N/A:N/A] Left, Distal, Medial Foot Right T Fifth oe N/A Wound Location: [place], [place] ([id number]) [id number] Page 5  of 11 Gradually Appeared Blister N/A Wounding Event: Pressure Ulcer Pressure Ulcer N/A Primary Etiology: Chronic Obstructive Pulmonary Chronic Obstructive Pulmonary N/A Comorbid History: Disease (COPD), Congestive Heart Disease (COPD), Congestive Heart Failure, Dementia Failure, Dementia 01/22/2022 03/02/2022 N/A Date Acquired: 3 1 N/A Weeks of Treatment: Open Open N/A Wound Status: No No N/A Wound Recurrence: 0.5x0.4x0.4 0.1x0.1x0.1 N/A Measurements [name] x W x D (cm) 0.157 0.008 N/A A (cm) : rea 0.063 0.001 N/A Volume (cm) : -121.10% 88.70% N/A % Reduction in A rea: -350.00% 85.70% N/A % Reduction in Volume: 4 Starting Position 1 (o'clock): 10 Ending Position 1 (o'clock): 0.4 Maximum Distance 1 (cm): Yes N/A N/A Undermining: Category/Stage III Unstageable/Unclassified N/A Classification: Medium Small N/A Exudate A mount: Serosanguineous Serosanguineous N/A Exudate Type: red, brown red, brown N/A Exudate Color: N/A Flat and Intact N/A Wound Margin: Small (1-33%) None Present (0%) N/A Granulation A mount: Pink N/A N/A Granulation Quality: Large (67-100%) None Present (0%) N/A Necrotic A mount: Fat Layer (Subcutaneous Tissue): Yes Fascia: Yes N/A Exposed Structures: Fascia: No Fat Layer (Subcutaneous Tissue): Yes Tendon: No Tendon: No Muscle: No Muscle: No Joint: No Joint: No Bone:  No Bone: No None N/A N/A Epithelialization: Treatment Notes Electronic Signature(s) Signed: 03/16/2022 9:45:50 AM By: Massie Kluver Entered By: Massie Kluver on 03/15/2022 09:52:04 -------------------------------------------------------------------------------- Multi-Disciplinary Care Plan Details Patient Name: Date of Service: [name], [name]. 03/15/2022 9:30 A M Medical Record Number: [id number] Patient Account Number: [id number] Date of Birth/Sex: Treating RN: 07/06/1934 (88 y.o. [name] Primary Care Altie Savard: Denyce Robert Other Clinician: Massie Kluver Referring Maleik Vanderzee: Treating Sheyla Zaffino/Extender: Janene Madeira Weeks in Treatment: 3 Active Inactive Abuse / Safety / Falls / Self Care Management Nursing Diagnoses: Potential for injury related to falls Goals: Patient will remain injury free related to falls Date Initiated: 02/22/2022 Target Resolution Date: 03/23/2022 Goal Status: Active Interventions: Assess Activities of Daily Living upon admission and as needed [place], [name] ([id number]) [id number] Page 6 of 11 Assess fall risk on admission and as needed Assess: immobility, friction, shearing, incontinence upon admission and as needed Assess impairment of mobility on admission and as needed per policy Assess personal safety and home safety (as indicated) on admission and as needed Assess self care needs on admission and as needed Notes: Necrotic Tissue Nursing Diagnoses: Knowledge deficit related to management of necrotic/devitalized tissue Goals: Necrotic/devitalized tissue will be minimized in the wound bed Date Initiated: 02/22/2022 Target Resolution Date: 03/23/2022 Goal Status: Active Interventions: Assess patient pain level pre-, during and post procedure and prior to discharge Provide education on necrotic tissue and debridement process Notes: Wound/Skin Impairment Nursing Diagnoses: Knowledge  deficit related to ulceration/compromised skin integrity Goals: Patient/caregiver will verbalize understanding of skin care regimen Date Initiated: 02/22/2022 Target Resolution Date: 03/23/2022 Goal Status: Active Ulcer/skin breakdown will have a volume reduction of 30% by week 4 Date Initiated: 02/22/2022 Target Resolution Date: 03/23/2022 Goal Status: Active Ulcer/skin breakdown will have a volume reduction of 50% by week 8 Date Initiated: 02/22/2022 Target Resolution Date: 04/23/2022 Goal Status: Active Ulcer/skin breakdown will have a volume reduction of 80% by week 12 Date Initiated: 02/22/2022 Target Resolution Date: 05/23/2022 Goal Status: Active Ulcer/skin breakdown will heal within 14 weeks Date Initiated: 02/22/2022 Target Resolution Date: 06/23/2022 Goal Status: Active Interventions: Assess patient/caregiver ability to obtain necessary supplies Assess patient/caregiver ability to perform ulcer/skin care regimen upon admission and as needed Assess ulceration(s) every visit Notes: Electronic Signature(s) Signed: 03/15/2022 10:41:35 AM By: Gretta Cool, BSN, RN, CWS, [name] RN,  BSN Signed: 03/16/2022 9:45:50 AM By: Massie Kluver Entered By: Massie Kluver on 03/15/2022 10:01:56 -------------------------------------------------------------------------------- Pain Assessment Details Patient Name: Date of Service: [place] 03/15/2022 9:30 A M Medical Record Number: [id number] Patient Account Number: [id number] [name], [name] ([id number]) [id number] Page 7 of 11 Date of Birth/Sex: Treating RN: 10/26/1934 (88 y.o. [name] Primary Care Najee Cowens: Other Clinician: Elwin Sleight Referring Kendle Turbin: Treating Arlo Buffone/Extender: Janene Madeira Weeks in Treatment: 3 Active Problems Location of Pain Severity and Description of Pain Patient Has Paino Yes Site Locations Duration of the Pain. Constant / Intermittento  Constant Rate the pain. Current Pain Level: 3 Character of Pain Describe the Pain: Other: stinging Pain Management and Medication Current Pain Management: Medication: Yes Cold Application: No Rest: No Massage: No Activity: No T.E.N.S.: No Heat Application: No Leg drop or elevation: No [name] the Current Pain Management Adequate: Inadequate How does your wound impact your activities of daily livingo Sleep: No Bathing: No Appetite: No Relationship With Others: No Bladder Continence: No Emotions: No Bowel Continence: No Work: No Toileting: No Drive: No Dressing: No Hobbies: No Engineer, maintenance) Signed: 03/15/2022 10:41:35 AM By: Gretta Cool, BSN, RN, CWS, [name] RN, BSN Signed: 03/16/2022 9:45:50 AM By: Massie Kluver Entered By: Massie Kluver on 03/15/2022 09:38:04 -------------------------------------------------------------------------------- Patient/Caregiver Education Details Patient Name: Date of Service: [name], [name] 3/11/2024andnbsp9:30 A M Medical Record Number: [id number] Patient Account Number: [id number] Date of Birth/Gender: Treating RN: February 29, 1934 (88 y.o. [name] Primary Care Physician: Denyce Robert Other Clinician: Massie Kluver Referring Physician: Treating Physician/Extender: Janene Madeira Weeks in Treatment: [place], [place] ([id number]) [id number] Page 8 of 11 Education Assessment Education Provided To: Patient and Caregiver Education Topics Provided Wound/Skin Impairment: Handouts: Other: continue wound care as directed Methods: Explain/Verbal Responses: State content correctly Electronic Signature(s) Signed: 03/16/2022 9:45:50 AM By: Massie Kluver Entered By: Massie Kluver on 03/15/2022 10:02:22 -------------------------------------------------------------------------------- Wound Assessment Details Patient Name: Date of Service: [name], [name]. 03/15/2022 9:30 A M Medical Record  Number: [id number] Patient Account Number: [id number] Date of Birth/Sex: Treating RN: [birth year] (88 y.o. [name], [name] Primary Care Abelina Ketron: Denyce Robert Other Clinician: Massie Kluver Referring Mars Scheaffer: Treating [name]/Extender: Janene Madeira Weeks in Treatment: 3 Wound Status Wound Number: 1 Primary Pressure Ulcer Etiology: Wound Location: Left, Distal, Medial Foot Wound Open Wounding Event: Gradually Appeared Status: Date Acquired: 01/22/2022 Comorbid Chronic Obstructive Pulmonary Disease (COPD), Congestive Weeks Of Treatment: 3 History: Heart Failure, Dementia Clustered Wound: No Photos Wound Measurements Length: (cm) 0.5 Width: (cm) 0.4 Depth: (cm) 0.4 Area: (cm) 0.157 Volume: (cm) 0.063 % Reduction in Area: -121.1% % Reduction in Volume: -350% Epithelialization: None Undermining: Yes Starting Position (o'clock): 4 Ending Position (o'clock): 10 Maximum Distance: (cm) 0.4 Wound Description [name], [name] ([id number]) Classification: Category/Stage III Exudate Amount: Medium Exudate Type: Serosanguineous Exudate Color: red, brown [id number] Page 9 of 11 Foul Odor After Cleansing: No Slough/Fibrino Yes Wound Bed Granulation Amount: Small (1-33%) Exposed Structure Granulation Quality: Pink Fascia Exposed: No Necrotic Amount: Large (67-100%) Fat Layer (Subcutaneous Tissue) Exposed: Yes Necrotic Quality: Adherent Slough Tendon Exposed: No Muscle Exposed: No Joint Exposed: No Bone Exposed: No Treatment Notes Wound #1 (Foot) Wound Laterality: Left, Medial, Distal Cleanser [place] Ancillary Kit - 15 Day Supply Discharge Instruction: Use supplies as instructed; Kit contains: (15) Saline Bullets; (15) 3x3 Gauze; 15 pr Gloves Soap and Water Discharge Instruction: Gently cleanse wound with antibacterial soap, rinse and pat dry prior to  dressing wounds Peri-Wound Care Topical Primary Dressing Endoform  Natural, Non-fenestrated, 2x2 (in/in) Secondary Dressing (BORDER) Zetuvit Plus SILICONE BORDER Dressing 4x4 (in/in) Discharge Instruction: Please do not put silicone bordered dressings under wraps. Use non-bordered dressing only. Secured With Compression Wrap Compression Stockings Environmental education officer) Signed: 03/15/2022 10:41:35 AM By: Gretta Cool, BSN, RN, CWS, [name] RN, BSN Signed: 03/16/2022 9:45:50 AM By: Massie Kluver Entered By: Massie Kluver on 03/15/2022 09:48:57 -------------------------------------------------------------------------------- Wound Assessment Details Patient Name: Date of Service: [name], Kenyia [name]. 03/15/2022 9:30 A M Medical Record Number: [id number] Patient Account Number: [id number] Date of Birth/Sex: Treating RN: [birth year] (88 y.o. [name] Primary Care Britnee Mcdevitt: Denyce Robert Other Clinician: Massie Kluver Referring Cambry Spampinato: Treating Calogero Geisen/Extender: Janene Madeira Weeks in Treatment: 3 Wound Status Wound Number: 2 Primary Pressure Ulcer Etiology: Wound Location: Right T Fifth oe Wound Open Wounding Event: Blister Status: Date Acquired: 03/02/2022 Comorbid Chronic Obstructive Pulmonary Disease (COPD), Congestive Weeks Of Treatment: [name], [name] ([id number]) [id number] Page 10 of 11 Weeks Of Treatment: 1 History: Heart Failure, Dementia Clustered Wound: No Photos Wound Measurements Length: (cm) Width: (cm) Depth: (cm) Area: (cm) Volume: (cm) 0 % Reduction in Area: 100% 0 % Reduction in Volume: 100% 0 Epithelialization: Large (67-100%) 0 0 Wound Description Classification: Unstageable/Unclassified Wound Margin: Flat and Intact Exudate Amount: None Present Foul Odor After Cleansing: No Slough/Fibrino No Wound Bed Granulation Amount: None Present (0%) Exposed Structure Necrotic Amount: None Present (0%) Fascia Exposed: Yes Fat Layer (Subcutaneous Tissue) Exposed:  Yes Tendon Exposed: No Muscle Exposed: No Joint Exposed: No Bone Exposed: No Electronic Signature(s) Signed: 03/15/2022 10:41:35 AM By: Gretta Cool, BSN, RN, CWS, [name] RN, BSN Signed: 03/16/2022 9:45:50 AM By: Massie Kluver Entered By: Massie Kluver on 03/15/2022 09:58:14 -------------------------------------------------------------------------------- [place] Details Patient Name: Date of Service: [name], [name]. 03/15/2022 9:30 A M Medical Record Number: [id number] Patient Account Number: [id number] Date of Birth/Sex: Treating RN: 1934-04-02 (88 y.o. [name], [name] Primary Care Devonne Kitchen: Denyce Robert Other Clinician: Massie Kluver Referring Meziah Blasingame: Treating Airyanna Dipalma/Extender: Janene Madeira Weeks in Treatment: 3 Vital Signs Time Taken: 09:35 Temperature (F): 97.4 Height (in): 62 Pulse (bpm): 81 Weight (lbs): 105 Respiratory Rate (breaths/min): 16 Body Mass Index (BMI): 19.2 Blood Pressure (mmHg): 116/78 Reference Range: 80 - 120 mg / dl [name], [name] ([id number]) [id number] Page 11 of 11 Electronic Signature(s) Signed: 03/16/2022 9:45:50 AM By: Massie Kluver Entered By: Massie Kluver on 03/15/2022 09:37:54

## 2022-03-17 NOTE — Progress Notes (Unsigned)
   Virtual Visit via Telephone Note   Because of [name] co-morbid illnesses, she is at least at moderate risk for complications without adequate follow up.  This format is felt to be most appropriate for this patient at this time.  The patient did not have access to video technology/had technical difficulties with video requiring transitioning to audio format only (telephone).  All issues noted in this document were discussed and addressed.  No physical exam could be performed with this format.  Please refer to the patient's chart for her consent to telehealth for [facility].    Date:  03/18/2022   ID:  [name], DOB [birth year], MRN [id number] The patient was identified using 2 identifiers.  Patient Location: Home Provider Location: Office/Clinic  Evaluation Performed:  Follow-Up Visit  History of Present Illness:    [name] is an 88 y.o. female last seen in August 2022.  I spoke with her daughter by phone today.  Patient lives with her daughter, also has an aide during the week.  She has been going to a wound center for skin care.  Had some worsening leg edema, we discussed taking an extra half Lasix dose for at least a few days at a time when this occurs.  Weight is up about 5 pounds.  Records indicate ER encounter in late February with COPD exacerbation.  See that she is status post left hip hemiarthroplasty for treatment of hip fracture following mechanical fall in September of last year.  I reviewed the remainder of her medications as well.  She remains on a potassium supplement as well as Zocor.   Labs/Other Tests and Data Reviewed:    EKG:  An ECG dated 02/28/2022 was personally reviewed today and demonstrated:  Sinus rhythm.  Recent Labs: 06/11/2021: TSH 3.109 09/30/2021: ALT 13; Magnesium 2.4 02/28/2022: B Natriuretic Peptide 90.0; BUN 25; Creatinine, Ser 1.23; Hemoglobin 10.5; Platelets 215; Potassium 4.4; Sodium 136  February 2024: Cholesterol 228,  triglycerides 96, HDL 86, LDL 125  Wt Readings from Last 3 Encounters:  03/18/22 108 lb (49 kg)  02/28/22 103 lb 9.9 oz (47 kg)  09/23/21 105 lb (47.6 kg)        Objective:    Vital Signs:  BP 112/72   Pulse 76   Ht 4\' 8"  (1.422 m)   Wt 108 lb (49 kg)   SpO2 96%   BMI 24.21 kg/m     ASSESSMENT & PLAN:    1.  HFpEF, LVEF 60 to 65% with normal RV contraction by echocardiogram in September 2020.  Recent BNP normal during ER encounter for suspected COPD exacerbation.  Continue Lasix 40 mg daily with potassium supplement, can take an additional 20 mg for a few days at a time as leg swelling recurs.  Not an optimal candidate for SGLT2 inhibitor with increased infection risk.  2.  Dementia, on Aricept and Namenda.  3.  COPD.  4.  Mixed hyperlipidemia, on Zocor.  Time:   Today, I have spent 6 minutes with the patient with telehealth technology discussing the above problems.     Signed, Rozann Lesches, MD  03/18/2022 10:52 AM    [place]

## 2022-03-18 ENCOUNTER — Ambulatory Visit: Payer: Medicare Other | Attending: Cardiology | Admitting: Cardiology

## 2022-03-18 ENCOUNTER — Telehealth: Payer: Self-pay

## 2022-03-18 ENCOUNTER — Encounter: Payer: Self-pay | Admitting: Cardiology

## 2022-03-18 VITALS — BP 112/72 | HR 76 | Ht <= 58 in | Wt 108.0 lb

## 2022-03-18 DIAGNOSIS — I5032 Chronic diastolic (congestive) heart failure: Secondary | ICD-10-CM | POA: Diagnosis not present

## 2022-03-18 NOTE — Patient Instructions (Signed)
Medication Instructions:  Your physician recommends that you continue on your current medications as directed. Please refer to the Current Medication list given to you today.   Labwork: None today  Testing/Procedures: None today  Follow-Up: 6 months  Any Other Special Instructions Will Be Listed Below (If Applicable).  If you need a refill on your cardiac medications before your next appointment, please call your pharmacy.  

## 2022-03-18 NOTE — Telephone Encounter (Signed)
  Patient Consent for Virtual Visit        [name] has provided verbal consent on 03/18/2022 for a virtual visit (video or telephone).   CONSENT FOR VIRTUAL VISIT FOR:  [name]  By participating in this virtual visit I agree to the following:  I hereby voluntarily request, consent and authorize [facility] and its employed or contracted physicians, physician assistants, nurse practitioners or other licensed health care professionals (the Practitioner), to provide me with telemedicine health care services (the "Services") as deemed necessary by the treating Practitioner. I acknowledge and consent to receive the Services by the Practitioner via telemedicine. I understand that the telemedicine visit will involve communicating with the Practitioner through live audiovisual communication technology and the disclosure of certain medical information by electronic transmission. I acknowledge that I have been given the opportunity to request an in-person assessment or other available alternative prior to the telemedicine visit and am voluntarily participating in the telemedicine visit.  I understand that I have the right to withhold or withdraw my consent to the use of telemedicine in the course of my care at any time, without affecting my right to future care or treatment, and that the Practitioner or I may terminate the telemedicine visit at any time. I understand that I have the right to inspect all information obtained and/or recorded in the course of the telemedicine visit and may receive copies of available information for a reasonable fee.  I understand that some of the potential risks of receiving the Services via telemedicine include:  Delay or interruption in medical evaluation due to technological equipment failure or disruption; Information transmitted may not be sufficient (e.g. poor resolution of images) to allow for appropriate medical decision making by the Practitioner;  and/or  In rare instances, security protocols could fail, causing a breach of personal health information.  Furthermore, I acknowledge that it is my responsibility to provide information about my medical history, conditions and care that is complete and accurate to the best of my ability. I acknowledge that Practitioner's advice, recommendations, and/or decision may be based on factors not within their control, such as incomplete or inaccurate data provided by me or distortions of diagnostic images or specimens that may result from electronic transmissions. I understand that the practice of medicine is not an exact science and that Practitioner makes no warranties or guarantees regarding treatment outcomes. I acknowledge that a copy of this consent can be made available to me via my patient portal ([facility]), or I can request a printed copy by calling the office of [facility].    I understand that my insurance will be billed for this visit.   I have read or had this consent read to me. I understand the contents of this consent, which adequately explains the benefits and risks of the Services being provided via telemedicine.  I have been provided ample opportunity to ask questions regarding this consent and the Services and have had my questions answered to my satisfaction. I give my informed consent for the services to be provided through the use of telemedicine in my medical care

## 2022-03-22 ENCOUNTER — Encounter: Payer: Medicare Other | Admitting: Physician Assistant

## 2022-03-22 DIAGNOSIS — L89893 Pressure ulcer of other site, stage 3: Secondary | ICD-10-CM | POA: Diagnosis not present

## 2022-03-22 NOTE — Progress Notes (Signed)
ZANITA, REDBIRD ([id number]) [id number] Page 1 of 7 Visit Report for 03/22/2022 Chief Complaint Document Details Patient Name: Date of Service: [name] 03/22/2022 10:00 A M Medical Record Number: [id number] Patient Account Number: [id number] Date of Birth/Sex: Treating RN: 06/29/1934 (88 y.o. [name] Primary Care Provider: Denyce Robert Other Clinician: Massie Kluver Referring Provider: Treating Provider/Extender: Norwood Levo Weeks in Treatment: 4 Information Obtained from: Patient Chief Complaint Left foot pressure ulcer Electronic Signature(s) Signed: 03/22/2022 10:28:35 AM By: Worthy Keeler PA-C Entered By: Worthy Keeler on 03/22/2022 10:28:35 -------------------------------------------------------------------------------- Debridement Details Patient Name: Date of Service: [name], Verdelle L. 03/22/2022 10:00 A M Medical Record Number: [id number] Patient Account Number: [id number] Date of Birth/Sex: Treating RN: Jul 29, 1934 (88 y.o. [name] Primary Care Provider: Denyce Robert Other Clinician: Massie Kluver Referring Provider: Treating Provider/Extender: Norwood Levo Weeks in Treatment: 4 Debridement Performed for Assessment: Wound #1 Left,Distal,Medial Foot Performed By: Physician Tommie Sams., PA-C Debridement Type: Debridement Level of Consciousness (Pre-procedure): Awake and Alert Pre-procedure Verification/Time Out Yes - 10:32 Taken: Start Time: 10:32 T Area Debrided (L x W): otal 0.6 (cm) x 0.3 (cm) = 0.18 (cm) Tissue and other material debrided: Viable, Non-Viable, Slough, Subcutaneous, Slough Level: Skin/Subcutaneous Tissue Debridement Description: Excisional Instrument: Curette Bleeding: Minimum Hemostasis Achieved: Pressure Response to Treatment: Procedure was tolerated well Level of Consciousness (Post- Awake and Alert procedure): Post Debridement Measurements of  Total Wound Legler, Kaydynce L ([id number]) [id number] Page 2 of 7 Length: (cm) 0.6 Stage: Category/Stage III Width: (cm) 0.3 Depth: (cm) 0.4 Volume: (cm) 0.057 Character of Wound/Ulcer Post Debridement: Stable Post Procedure Diagnosis Same as Pre-procedure Electronic Signature(s) Signed: 03/22/2022 5:25:35 PM By: Gretta Cool, BSN, RN, CWS, [name] RN, BSN Signed: 03/22/2022 5:58:33 PM By: Worthy Keeler PA-C Signed: 03/23/2022 9:39:47 AM By: Massie Kluver Entered By: Massie Kluver on 03/22/2022 10:34:14 -------------------------------------------------------------------------------- HPI Details Patient Name: Date of Service: [name], Dasja L. 03/22/2022 10:00 A M Medical Record Number: [id number] Patient Account Number: [id number] Date of Birth/Sex: Treating RN: 1934-03-13 (88 y.o. [name] Primary Care Provider: Denyce Robert Other Clinician: Massie Kluver Referring Provider: Treating Provider/Extender: Norwood Levo Weeks in Treatment: 4 History of Present Illness HPI Description: 02-22-2022 upon evaluation today patient appears to be doing somewhat poorly in regard to her left medial first metatarsal head where she does have a bunion. Unfortunately this is causing some pressure and has led to a pressure injury at this site. Fortunately I do not see any signs of infection at this point but that is something we definitely can need to keep a close eye on. Fortunately there does not appear to be any signs of active infection locally nor systemically at this point. No fevers, chills, nausea, vomiting, or diarrhea. Patient does have a history of of vascular dementia, COPD, congestive heart failure, and the dementia does affect her ability to be able to in some cases follow directions. She is very nervous about things in general. This is stated to have started somewhere around January 22, 2022. 03-02-2022 upon evaluation today patient appears to be  doing well currently in regard to her wounds all things considered. Fortunately I do not see any signs of infection unfortunately both wounds are still open and the one on the medial foot first metatarsal location is still the worst. Although the area over the fifth toe right foot actually is also open at this point unfortunately. With that being said I  do think we can need to continue to monitor for any signs of worsening or infection although right now I think she is doing okay in that regard. 3/4; this is a patient with a bunion wound on the left medial foot. She also had a small blister on the right lateral fifth toe. She went for x-rays last week no acute fracture or dislocation was seen on the left side she had worsened moderate hallucis valgus and moderate second and third fourth fifth tarsometatarsal osteoarthritis. We use Prisma. 3/11; patient presents for follow-up. She has been using endoform to the left medial foot wound. She has cushion pads that she uses in between the toes to help offload the areas and prevent future wounds. The right lateral fifth met head remains closed. 03-22-2022 upon evaluation today patient appears to be doing well currently in regard to her wound. She does show some signs of improvement here. She is also showing some signs of being a little bit too moist I think we may want to cut back on the hydrogel possibly just using the saline only at this point she is in agreement with that plan. Fortunately I do not see any evidence of active infection locally nor systemically which is great news. No fevers, chills, nausea, vomiting, or diarrhea. Electronic Signature(s) Signed: 03/22/2022 11:10:20 AM By: Worthy Keeler PA-C Entered By: Worthy Keeler on 03/22/2022 11:10:20 Whinery, Anne Hahn ([id number]) [id number] Page 3 of 7 -------------------------------------------------------------------------------- Physical Exam Details Patient Name:  Date of Service: [name], Glenora L. 03/22/2022 10:00 A M Medical Record Number: [id number] Patient Account Number: [id number] Date of Birth/Sex: Treating RN: 1934-10-17 (88 y.o. [name] Primary Care Provider: Denyce Robert Other Clinician: Massie Kluver Referring Provider: Treating Provider/Extender: Norwood Levo Weeks in Treatment: 4 Constitutional Well-nourished and well-hydrated in no acute distress. Respiratory normal breathing without difficulty. Psychiatric this patient is able to make decisions and demonstrates good insight into disease process. Alert and Oriented x 3. pleasant and cooperative. Notes Patient's wound bed did require sharp debridement to clearway some of the necrotic debris. She tolerated the debridement today without complication and postdebridement wound bed is significantly improved which is great news and overall I am extremely pleased with where we stand today. Electronic Signature(s) Signed: 03/22/2022 11:10:45 AM By: Worthy Keeler PA-C Entered By: Worthy Keeler on 03/22/2022 11:10:45 -------------------------------------------------------------------------------- Physician Orders Details Patient Name: Date of Service: [place] 03/22/2022 10:00 A M Medical Record Number: [id number] Patient Account Number: [id number] Date of Birth/Sex: Treating RN: 09/03/1934 (88 y.o. [name] Primary Care Provider: Denyce Robert Other Clinician: Massie Kluver Referring Provider: Treating Provider/Extender: Norwood Levo Weeks in Treatment: 4 Verbal / Phone Orders: No Diagnosis Coding ICD-10 Coding Code Description [phone] Pressure ulcer of other site, stage 3 I50.42 Chronic combined systolic (congestive) and diastolic (congestive) heart failure F01.511 Vascular dementia, unspecified severity, with agitation J44.9 Chronic obstructive pulmonary disease, unspecified Follow-up Appointments Return Appointment in  1 week. AGNESS, DEILY ([id number]) [id number] Page 4 of 7 Bathing/ [employer] wounds with antibacterial soap and water. Anesthetic (Use 'Patient Medications' Section for Anesthetic Order Entry) Lidocaine applied to wound bed Edema Control - Lymphedema / Segmental Compressive Device / Other Elevate, Exercise Daily and A void Standing for Long Periods of Time. Elevate legs to the level of the heart and pump ankles as often as possible Elevate leg(s) parallel to the floor when sitting. Wound Treatment Wound #1 - Foot Wound  Laterality: Left, Medial, Distal Cleanser: Byram Ancillary Kit - 15 Day Supply (Generic) 3 x Per Week/30 Days Discharge Instructions: Use supplies as instructed; Kit contains: (15) Saline Bullets; (15) 3x3 Gauze; 15 pr Gloves Cleanser: Soap and Water 3 x Per Week/30 Days Discharge Instructions: Gently cleanse wound with antibacterial soap, rinse and pat dry prior to dressing wounds Prim Dressing: Endoform Natural, Non-fenestrated, 2x2 (in/in) ary 3 x Per Week/30 Days Secondary Dressing: (BORDER) Zetuvit Plus SILICONE BORDER Dressing 4x4 (in/in) (Generic) 3 x Per Week/30 Days Discharge Instructions: Please do not put silicone bordered dressings under wraps. Use non-bordered dressing only. Electronic Signature(s) Signed: 03/22/2022 5:58:33 PM By: Worthy Keeler PA-C Signed: 03/23/2022 9:39:47 AM By: Massie Kluver Entered By: Massie Kluver on 03/22/2022 10:34:34 -------------------------------------------------------------------------------- Problem List Details Patient Name: Date of Service: [name], [place] 03/22/2022 10:00 A M Medical Record Number: [id number] Patient Account Number: [id number] Date of Birth/Sex: Treating RN: 05/18/1934 (88 y.o. [name] Primary Care Provider: Denyce Robert Other Clinician: Massie Kluver Referring Provider: Treating Provider/Extender: Norwood Levo Weeks in Treatment:  4 Active Problems ICD-10 Encounter Code Description Active Date MDM Diagnosis [phone] Pressure ulcer of other site, stage 3 02/22/2022 No Yes I50.42 Chronic combined systolic (congestive) and diastolic (congestive) heart failure 02/22/2022 No Yes F01.511 Vascular dementia, unspecified severity, with agitation 02/22/2022 No Yes J44.9 Chronic obstructive pulmonary disease, unspecified 02/22/2022 No Yes Cefalu, Toshi L ([id number]) [id number] Page 5 of 7 Inactive Problems Resolved Problems Electronic Signature(s) Signed: 03/22/2022 10:28:24 AM By: Worthy Keeler PA-C Entered By: Worthy Keeler on 03/22/2022 [id number] -------------------------------------------------------------------------------- Progress Note Details Patient Name: Date of Service: [place] 03/22/2022 10:00 A M Medical Record Number: [id number] Patient Account Number: [id number] Date of Birth/Sex: Treating RN: 12/18/1934 (88 y.o. [name] Primary Care Provider: Denyce Robert Other Clinician: Massie Kluver Referring Provider: Treating Provider/Extender: Norwood Levo Weeks in Treatment: 4 Subjective Chief Complaint Information obtained from Patient Left foot pressure ulcer History of Present Illness (HPI) 02-22-2022 upon evaluation today patient appears to be doing somewhat poorly in regard to her left medial first metatarsal head where she does have a bunion. Unfortunately this is causing some pressure and has led to a pressure injury at this site. Fortunately I do not see any signs of infection at this point but that is something we definitely can need to keep a close eye on. Fortunately there does not appear to be any signs of active infection locally nor systemically at this point. No fevers, chills, nausea, vomiting, or diarrhea. Patient does have a history of of vascular dementia, COPD, congestive heart failure, and the dementia does affect her ability to be  able to in some cases follow directions. She is very nervous about things in general. This is stated to have started somewhere around January 22, 2022. 03-02-2022 upon evaluation today patient appears to be doing well currently in regard to her wounds all things considered. Fortunately I do not see any signs of infection unfortunately both wounds are still open and the one on the medial foot first metatarsal location is still the worst. Although the area over the fifth toe right foot actually is also open at this point unfortunately. With that being said I do think we can need to continue to monitor for any signs of worsening or infection although right now I think she is doing okay in that regard. 3/4; this is a patient with a bunion wound on the left medial  foot. She also had a small blister on the right lateral fifth toe. She went for x-rays last week no acute fracture or dislocation was seen on the left side she had worsened moderate hallucis valgus and moderate second and third fourth fifth tarsometatarsal osteoarthritis. We use Prisma. 3/11; patient presents for follow-up. She has been using endoform to the left medial foot wound. She has cushion pads that she uses in between the toes to help offload the areas and prevent future wounds. The right lateral fifth met head remains closed. 03-22-2022 upon evaluation today patient appears to be doing well currently in regard to her wound. She does show some signs of improvement here. She is also showing some signs of being a little bit too moist I think we may want to cut back on the hydrogel possibly just using the saline only at this point she is in agreement with that plan. Fortunately I do not see any evidence of active infection locally nor systemically which is great news. No fevers, chills, nausea, vomiting, or diarrhea. Objective Constitutional Well-nourished and well-hydrated in no acute distress. Vitals Time Taken: 10:13 AM, Height: 62 in,  Weight: 105 lbs, BMI: 19.2, Temperature: 97.9 F, Pulse: 82 bpm, Respiratory Rate: 16 breaths/min, Blood Pressure: 121/83 mmHg. Respiratory normal breathing without difficulty. DONNIESHA, BOITNOTT ([id number]) [id number] Page 6 of 7 Psychiatric this patient is able to make decisions and demonstrates good insight into disease process. Alert and Oriented x 3. pleasant and cooperative. General Notes: Patient's wound bed did require sharp debridement to clearway some of the necrotic debris. She tolerated the debridement today without complication and postdebridement wound bed is significantly improved which is great news and overall I am extremely pleased with where we stand today. Integumentary (Hair, Skin) Wound #1 status is Open. Original cause of wound was Gradually Appeared. The date acquired was: 01/22/2022. The wound has been in treatment 4 weeks. The wound is located on the Left,Distal,Medial Foot. The wound measures 0.6cm length x 0.3cm width x 0.3cm depth; 0.141cm^2 area and 0.042cm^3 volume. There is Fat Layer (Subcutaneous Tissue) exposed. There is no tunneling noted, however, there is undermining starting at 3:00 and ending at 10:00 with a maximum distance of 0.3cm. There is a medium amount of serosanguineous drainage noted. There is small (1-33%) pink granulation within the wound bed. There is a large (67-100%) amount of necrotic tissue within the wound bed including Adherent Slough. Assessment Active Problems ICD-10 Pressure ulcer of other site, stage 3 Chronic combined systolic (congestive) and diastolic (congestive) heart failure Vascular dementia, unspecified severity, with agitation Chronic obstructive pulmonary disease, unspecified Procedures Wound #1 Pre-procedure diagnosis of Wound #1 is a Pressure Ulcer located on the Left,Distal,Medial Foot . There was a Excisional Skin/Subcutaneous Tissue Debridement with a total area of 0.18 sq cm performed by  Tommie Sams., PA-C. With the following instrument(s): Curette to remove Viable and Non-Viable tissue/material. Material removed includes Subcutaneous Tissue and Slough and. A time out was conducted at 10:32, prior to the start of the procedure. A Minimum amount of bleeding was controlled with Pressure. The procedure was tolerated well. Post Debridement Measurements: 0.6cm length x 0.3cm width x 0.4cm depth; 0.057cm^3 volume. Post debridement Stage noted as Category/Stage III. Character of Wound/Ulcer Post Debridement is stable. Post procedure Diagnosis Wound #1: Same as Pre-Procedure Plan Follow-up Appointments: Return Appointment in 1 week. Bathing/ Shower/ Hygiene: Wash wounds with antibacterial soap and water. Anesthetic (Use 'Patient Medications' Section for Anesthetic Order Entry): Lidocaine applied to wound bed  Edema Control - Lymphedema / Segmental Compressive Device / Other: Elevate, Exercise Daily and Avoid Standing for Long Periods of Time. Elevate legs to the level of the heart and pump ankles as often as possible Elevate leg(s) parallel to the floor when sitting. WOUND #1: - Foot Wound Laterality: Left, Medial, Distal Cleanser: Byram Ancillary Kit - 15 Day Supply (Generic) 3 x Per Week/30 Days Discharge Instructions: Use supplies as instructed; Kit contains: (15) Saline Bullets; (15) 3x3 Gauze; 15 pr Gloves Cleanser: Soap and Water 3 x Per Week/30 Days Discharge Instructions: Gently cleanse wound with antibacterial soap, rinse and pat dry prior to dressing wounds Prim Dressing: Endoform Natural, Non-fenestrated, 2x2 (in/in) 3 x Per Week/30 Days ary Secondary Dressing: (BORDER) Zetuvit Plus SILICONE BORDER Dressing 4x4 (in/in) (Generic) 3 x Per Week/30 Days Discharge Instructions: Please do not put silicone bordered dressings under wraps. Use non-bordered dressing only. 1. I am going to suggest that we have the patient continue with the endoform although we are getting this  moistened with saline when I can have the hydrogel I think that is keeping a little bit too moist. 2. I am also can recommend that we have the patient continue to monitor for any signs of infection or worsening. Office if anything changes she knows contact the office and let me know. We will see patient back for reevaluation in 1 week here in the clinic. If anything worsens or changes patient will contact our office for additional recommendations. Electronic Signature(s) Signed: 03/22/2022 11:11:02 AM By: Worthy Keeler PA-C Entered By: Worthy Keeler on 03/22/2022 11:11:02 Proctor, [place] L ([id number]) [id number] Page 7 of 7 -------------------------------------------------------------------------------- SuperBill Details Patient Name: Date of Service: [name], Cherlynn L. 03/22/2022 Medical Record Number: [id number] Patient Account Number: [id number] Date of Birth/Sex: Treating RN: 1934-07-29 (88 y.o. [name] Primary Care Provider: Denyce Robert Other Clinician: Massie Kluver Referring Provider: Treating Provider/Extender: Norwood Levo Weeks in Treatment: 4 Diagnosis Coding ICD-10 Codes Code Description [phone] Pressure ulcer of other site, stage 3 I50.42 Chronic combined systolic (congestive) and diastolic (congestive) heart failure F01.511 Vascular dementia, unspecified severity, with agitation J44.9 Chronic obstructive pulmonary disease, unspecified Facility Procedures : CPT4 Code: [id number] Description: [id number] - DEB SUBQ TISSUE 20 SQ CM/< ICD-10 Diagnosis Description I50.42 Chronic combined systolic (congestive) and diastolic (congestive) heart fail Modifier: ure Quantity: 1 Physician Procedures : CPT4 Code Description Modifier [id number] - WC PHYS SUBQ TISS 20 SQ CM ICD-10 Diagnosis Description I50.42 Chronic combined systolic (congestive) and diastolic (congestive) heart failure Quantity: 1 Electronic Signature(s) Signed:  03/22/2022 11:11:12 AM By: Worthy Keeler PA-C Entered By: Worthy Keeler on 03/22/2022 11:11:12

## 2022-03-23 ENCOUNTER — Ambulatory Visit: Payer: Medicare Other | Admitting: Orthopedic Surgery

## 2022-03-23 NOTE — Progress Notes (Signed)
TIFANNY, CHACON ([id number]) [id number] Page 1 of 9 Visit Report for 03/22/2022 Arrival Information Details Patient Name: Date of Service: [place]fe 03/22/2022 10:00 A M Medical Record Number: [id number] Patient Account Number: [id number] Date of Birth/Sex: Treating RN: [birth year] (88 y.o. [name] Primary Care Aneya Daddona: Denyce Robert Other Clinician: Massie Kluver Referring Caelan Branden: Treating Gail Vendetti/Extender: Norwood Levo Weeks in Treatment: 4 Visit Information History Since Last Visit All ordered tests and consults were completed: No Patient Arrived: Wheel Chair Added or deleted any medications: No Arrival Time: 10:05 Any new allergies or adverse reactions: No Transfer Assistance: EasyPivot Patient Lift Had a fall or experienced change in No Patient Identification Verified: Yes activities of daily living that may affect Secondary Verification Process Completed: Yes risk of falls: Patient Requires Transmission-Based Precautions: No Signs or symptoms of abuse/neglect since last visito No Patient Has Alerts: No Hospitalized since last visit: No Implantable device outside of the clinic excluding No cellular tissue based products placed in the center since last visit: Has Dressing in Place as Prescribed: Yes Pain Present Now: Yes Electronic Signature(s) Signed: 03/23/2022 9:39:47 AM By: Massie Kluver Entered By: Massie Kluver on 03/22/2022 10:10:00 -------------------------------------------------------------------------------- Clinic Level of Care Assessment Details Patient Name: Date of Service: [name], Chalice L. 03/22/2022 10:00 A M Medical Record Number: [id number] Patient Account Number: [id number] Date of Birth/Sex: Treating RN: July 11, 1934 (88 y.o. [name] Primary Care Joniece Smotherman: Denyce Robert Other Clinician: Massie Kluver Referring Manual Navarra: Treating Dhanya Bogle/Extender: Norwood Levo Weeks in Treatment: 4 Clinic Level of Care Assessment Items TOOL 1 Quantity Score []  - 0 Use when EandM and Procedure is performed on INITIAL visit ASSESSMENTS - Nursing Assessment / Reassessment []  - 0 General Physical Exam (combine w/ comprehensive assessment (listed just below) when performed on new pt. evals) []  - 0 Comprehensive Assessment (HX, ROS, Risk Assessments, Wounds Hx, etc.) Garfinkel, Daci L ([id number]) [phone].pdf Page 2 of 9 ASSESSMENTS - Wound and Skin Assessment / Reassessment []  - 0 Dermatologic / Skin Assessment (not related to wound area) ASSESSMENTS - Ostomy and/or Continence Assessment and Care []  - 0 Incontinence Assessment and Management []  - 0 Ostomy Care Assessment and Management (repouching, etc.) PROCESS - Coordination of Care []  - 0 Simple Patient / Family Education for ongoing care []  - 0 Complex (extensive) Patient / Family Education for ongoing care []  - 0 Staff obtains Programmer, systems, Records, T Results / Process Orders est []  - 0 Staff telephones HHA, Nursing Homes / Clarify orders / etc []  - 0 Routine Transfer to another Facility (non-emergent condition) []  - 0 Routine Hospital Admission (non-emergent condition) []  - 0 New Admissions / Biomedical engineer / Ordering NPWT Apligraf, etc. , []  - 0 Emergency Hospital Admission (emergent condition) PROCESS - Special Needs []  - 0 Pediatric / Minor Patient Management []  - 0 Isolation Patient Management []  - 0 Hearing / Language / Visual special needs []  - 0 Assessment of Community assistance (transportation, D/C planning, etc.) []  - 0 Additional assistance / Altered mentation []  - 0 Support Surface(s) Assessment (bed, cushion, seat, etc.) INTERVENTIONS - Miscellaneous []  - 0 External ear exam []  - 0 Patient Transfer (multiple staff / Civil Service fast streamer / Similar devices) []  - 0 Simple Staple / Suture removal (25 or less) []  - 0 Complex Staple / Suture  removal (26 or more) []  - 0 Hypo/Hyperglycemic Management (do not check if billed separately) []  - 0 Ankle / Brachial Index (ABI) - do not check  if billed separately Has the patient been seen at the hospital within the last three years: Yes Total Score: 0 Level Of Care: ____ Electronic Signature(s) Signed: 03/23/2022 9:39:47 AM By: Massie Kluver Entered By: Massie Kluver on 03/22/2022 10:35:06 -------------------------------------------------------------------------------- Encounter Discharge Information Details Patient Name: Date of Service: [place] 03/22/2022 10:00 A M Medical Record Number: [id number] Patient Account Number: [id number] Date of Birth/Sex: Treating RN: 06-20-1934 (88 y.o. [name] Primary Care Blayre Papania: Denyce Robert Other Clinician: Massie Kluver Referring Delontae Lamm: Treating Corby Villasenor/Extender: Norwood Levo Hancock, Sunset ([id number]) [phone].pdf Page 3 of 9 Weeks in Treatment: 4 Encounter Discharge Information Items Post Procedure Vitals Discharge Condition: Stable Temperature (F): 97.9 Ambulatory Status: Wheelchair Pulse (bpm): 82 Discharge Destination: Home Respiratory Rate (breaths/min): 16 Transportation: Private Auto Blood Pressure (mmHg): 121/83 Accompanied By: daughter Schedule Follow-up Appointment: Yes Clinical Summary of Care: Electronic Signature(s) Signed: 03/23/2022 9:39:47 AM By: Massie Kluver Entered By: Massie Kluver on 03/22/2022 10:52:41 -------------------------------------------------------------------------------- Lower Extremity Assessment Details Patient Name: Date of Service: [name], Izamar L. 03/22/2022 10:00 A M Medical Record Number: [id number] Patient Account Number: [id number] Date of Birth/Sex: Treating RN: 1934/07/06 (88 y.o. [name] Primary Care Searcy Miyoshi: Denyce Robert Other Clinician: Massie Kluver Referring Dyamon Sosinski: Treating Sherry Rogus/Extender:  Norwood Levo Weeks in Treatment: 4 Edema Assessment Assessed: [Left: Yes] [Right: No] Edema: [Left: Ye] [Right: s] Calf Left: Right: Point of Measurement: 32 cm From Medial Instep 28 cm Ankle Left: Right: Point of Measurement: 10 cm From Medial Instep 19 cm Vascular Assessment Pulses: Dorsalis Pedis Palpable: [Left:Yes] Electronic Signature(s) Signed: 03/22/2022 5:25:35 PM By: Gretta Cool, BSN, RN, CWS, [name] RN, BSN Signed: 03/23/2022 9:39:47 AM By: Massie Kluver Entered By: Massie Kluver on 03/22/2022 10:20:41 [place]es[place], Roland ([id number]) [id number] Page 4 of 9 -------------------------------------------------------------------------------- Multi Wound Chart Details Patient Name: Date of Service: [place] 03/22/2022 10:00 A M Medical Record Number: [id number] Patient Account Number: [id number] Date of Birth/Sex: Treating RN: May 01, 1934 (88 y.o. [name] Primary Care Kelvis Berger: Denyce Robert Other Clinician: Massie Kluver Referring Shaterra Sanzone: Treating Madisen Ludvigsen/Extender: Norwood Levo Weeks in Treatment: 4 Vital Signs Height(in): 4 Pulse(bpm): 62 Weight(lbs): 105 Blood Pressure(mmHg): 121/83 Body Mass Index(BMI): 19.2 Temperature(F): 97.9 Respiratory Rate(breaths/min): 16 [1:Photos:] [N/A:N/A] Left, Distal, Medial Foot N/A N/A Wound Location: Gradually Appeared N/A N/A Wounding Event: Pressure Ulcer N/A N/A Primary Etiology: Chronic Obstructive Pulmonary N/A N/A Comorbid History: Disease (COPD), Congestive Heart Failure, Dementia 01/22/2022 N/A N/A Date Acquired: 4 N/A N/A Weeks of Treatment: Open N/A N/A Wound Status: No N/A N/A Wound Recurrence: 0.6x0.3x0.3 N/A N/A Measurements L x W x D (cm) 0.141 N/A N/A A (cm) : rea 0.042 N/A N/A Volume (cm) : -98.60% N/A N/A % Reduction in A rea: -200.00% N/A N/A % Reduction in Volume: 3 Starting Position 1 (o'clock): 10 Ending Position 1  (o'clock): 0.3 Maximum Distance 1 (cm): Yes N/A N/A Undermining: Category/Stage III N/A N/A Classification: Medium N/A N/A Exudate A mount: Serosanguineous N/A N/A Exudate Type: red, brown N/A N/A Exudate Color: Small (1-33%) N/A N/A Granulation A mount: Pink N/A N/A Granulation Quality: Large (67-100%) N/A N/A Necrotic A mount: Fat Layer (Subcutaneous Tissue): Yes N/A N/A Exposed Structures: Fascia: No Tendon: No Muscle: No Joint: No Bone: No None N/A N/A Epithelialization: Treatment Notes Electronic Signature(s) Signed: 03/23/2022 9:39:47 AM By: Talmadge Chad, Toleen L ([id number]) [id number] Page 5 of 9 Signed: 03/23/2022 9:39:47 AM By: Massie Kluver Entered By: Massie Kluver on 03/22/2022  10:20:45 -------------------------------------------------------------------------------- Multi-Disciplinary Care Plan Details Patient Name: Date of Service: [place] 03/22/2022 10:00 A M Medical Record Number: [id number] Patient Account Number: [id number] Date of Birth/Sex: Treating RN: 12-28-1934 (88 y.o. [name], [name] Primary Care Amay Mijangos: Denyce Robert Other Clinician: Massie Kluver Referring Paxon Propes: Treating Anber Mckiver/Extender: Norwood Levo Weeks in Treatment: 4 Active Inactive Abuse / Safety / Falls / Self Care Management Nursing Diagnoses: Potential for injury related to falls Goals: Patient will remain injury free related to falls Date Initiated: 02/22/2022 Target Resolution Date: 03/23/2022 Goal Status: Active Interventions: Assess Activities of Daily Living upon admission and as needed Assess fall risk on admission and as needed Assess: immobility, friction, shearing, incontinence upon admission and as needed Assess impairment of mobility on admission and as needed per policy Assess personal safety and home safety (as indicated) on admission and as needed Assess self care needs on admission and as  needed Notes: Necrotic Tissue Nursing Diagnoses: Knowledge deficit related to management of necrotic/devitalized tissue Goals: Necrotic/devitalized tissue will be minimized in the wound bed Date Initiated: 02/22/2022 Target Resolution Date: 03/23/2022 Goal Status: Active Interventions: Assess patient pain level pre-, during and post procedure and prior to discharge Provide education on necrotic tissue and debridement process Notes: Wound/Skin Impairment Nursing Diagnoses: Knowledge deficit related to ulceration/compromised skin integrity Goals: Patient/caregiver will verbalize understanding of skin care regimen Date Initiated: 02/22/2022 Target Resolution Date: 03/23/2022 Goal Status: Active Ulcer/skin breakdown will have a volume reduction of 30% by week 4 Date Initiated: 02/22/2022 Target Resolution Date: 03/23/2022 Goal Status: Active AIA, HEDGES ([id number]) [id number] Page 6 of 9 Ulcer/skin breakdown will have a volume reduction of 50% by week 8 Date Initiated: 02/22/2022 Target Resolution Date: 04/23/2022 Goal Status: Active Ulcer/skin breakdown will have a volume reduction of 80% by week 12 Date Initiated: 02/22/2022 Target Resolution Date: 05/23/2022 Goal Status: Active Ulcer/skin breakdown will heal within 14 weeks Date Initiated: 02/22/2022 Target Resolution Date: 06/23/2022 Goal Status: Active Interventions: Assess patient/caregiver ability to obtain necessary supplies Assess patient/caregiver ability to perform ulcer/skin care regimen upon admission and as needed Assess ulceration(s) every visit Notes: Electronic Signature(s) Signed: 03/22/2022 5:25:35 PM By: Gretta Cool, BSN, RN, CWS, [name] RN, BSN Signed: 03/23/2022 9:39:47 AM By: Massie Kluver Entered By: Massie Kluver on 03/22/2022 10:35:15 -------------------------------------------------------------------------------- Pain Assessment Details Patient Name: Date of Service: [name], Sani L.  03/22/2022 10:00 A M Medical Record Number: [id number] Patient Account Number: [id number] Date of Birth/Sex: Treating RN: 04/29/1934 (88 y.o. [name] Primary Care Vinnie Gombert: Denyce Robert Other Clinician: Massie Kluver Referring Ellarose Brandi: Treating Naftali Carchi/Extender: Norwood Levo Weeks in Treatment: 4 Active Problems Location of Pain Severity and Description of Pain Patient Has Paino Yes Site Locations Pain Location: Pain in Ulcers Duration of the Pain. Constant / Intermittento Constant Rate the pain. Current Pain Level: 5 Worst Pain Level: 5 Character of Pain Describe the Pain: Aching Pain Management and Medication Current Pain Management: Medication: Yes Cold Application: No Rest: Yes Massage: No Timme, Ronne L ([id number] Page 7 of 9 Activity: No T.E.N.S.: No Heat Application: No Leg drop or elevation: No Is the Current Pain Management Adequate: Inadequate How does your wound impact your activities of daily livingo Sleep: No Bathing: No Appetite: No Relationship With Others: No Bladder Continence: No Emotions: No Bowel Continence: No Work: No Toileting: No Drive: No Dressing: No Hobbies: No Engineer, maintenance) Signed: 03/22/2022 5:25:35 PM By: Gretta Cool, BSN, RN, CWS, [name] RN, BSN Signed: 03/23/2022 9:39:47 AM By:  Massie Kluver Entered By: Massie Kluver on 03/22/2022 10:13:24 -------------------------------------------------------------------------------- Patient/Caregiver Education Details Patient Name: Date of Service: [name], Eriel L. 3/18/2024andnbsp10:00 A M Medical Record Number: [id number] Patient Account Number: [id number] Date of Birth/Gender: Treating RN: 02-04-1934 (88 y.o. [name] Primary Care Physician: Denyce Robert Other Clinician: Massie Kluver Referring Physician: Treating Physician/Extender: Clois Comber in Treatment: 4 Education  Assessment Education Provided To: Patient Education Topics Provided Wound/Skin Impairment: Handouts: Other: continue wound care as directed Methods: Explain/Verbal Responses: State content correctly Electronic Signature(s) Signed: 03/23/2022 9:39:47 AM By: Massie Kluver Entered By: Massie Kluver on 03/22/2022 10:35:36 -------------------------------------------------------------------------------- Wound Assessment Details Patient Name: Date of Service: [place], [place] 03/22/2022 10:00 [place] ([id number] Page 8 of 9 Medical Record Number: [id number] Patient Account Number: [id number] Date of Birth/Sex: Treating RN: 1934-03-05 (88 y.o. [name], [name] Primary Care Noella Kipnis: Denyce Robert Other Clinician: Massie Kluver Referring Senora Lacson: Treating Kellin Bartling/Extender: Norwood Levo Weeks in Treatment: 4 Wound Status Wound Number: 1 Primary Pressure Ulcer Etiology: Wound Location: Left, Distal, Medial Foot Wound Open Wounding Event: Gradually Appeared Status: Date Acquired: 01/22/2022 Comorbid Chronic Obstructive Pulmonary Disease (COPD), Congestive Weeks Of Treatment: 4 History: Heart Failure, Dementia Clustered Wound: No Photos Wound Measurements Length: (cm) 0.6 Width: (cm) 0.3 Depth: (cm) 0.3 Area: (cm) 0.141 Volume: (cm) 0.042 % Reduction in Area: -98.6% % Reduction in Volume: -200% Epithelialization: None Tunneling: No Undermining: Yes Starting Position (o'clock): 3 Ending Position (o'clock): 10 Maximum Distance: (cm) 0.3 Wound Description Classification: Category/Stage III Exudate Amount: Medium Exudate Type: Serosanguineous Exudate Color: red, brown Foul Odor After Cleansing: No Slough/Fibrino Yes Wound Bed Granulation Amount: Small (1-33%) Exposed Structure Granulation Quality: Pink Fascia Exposed: No Necrotic Amount: Large (67-100%) Fat Layer (Subcutaneous Tissue) Exposed:  Yes Necrotic Quality: Adherent Slough Tendon Exposed: No Muscle Exposed: No Joint Exposed: No Bone Exposed: No Treatment Notes Wound #1 (Foot) Wound Laterality: Left, Medial, Distal Cleanser Byram Ancillary Kit - 15 Day Supply Discharge Instruction: Use supplies as instructed; Kit contains: (15) Saline Bullets; (15) 3x3 Gauze; 15 pr Gloves Soap and Water Discharge Instruction: Gently cleanse wound with antibacterial soap, rinse and pat dry prior to dressing wounds Peri-Wound Care Topical Primary Dressing Endoform Natural, Non-fenestrated, 2x2 (in/in) Secondary Dressing (BORDER) Zetuvit Plus SILICONE BORDER Dressing 4x4 (in/in) Discharge Instruction: Please do not put silicone bordered dressings under wraps. Use non-bordered dressing only. MAEVE, MCKIVER ([id number]) [id number] Page 9 of 9 Secured With Compression Wrap Compression Stockings Environmental education officer) Signed: 03/22/2022 5:25:35 PM By: Gretta Cool, BSN, RN, CWS, [name] RN, BSN Signed: 03/23/2022 9:39:47 AM By: Massie Kluver Entered By: Massie Kluver on 03/22/2022 10:19:20 -------------------------------------------------------------------------------- Vitals Details Patient Name: Date of Service: [name], Justise L. 03/22/2022 10:00 A M Medical Record Number: [id number] Patient Account Number: [id number] Date of Birth/Sex: Treating RN: 1934-09-10 (88 y.o. [name], [name] Primary Care Terie Lear: Denyce Robert Other Clinician: Massie Kluver Referring Vernesha Talbot: Treating Grethel Zenk/Extender: Norwood Levo Weeks in Treatment: 4 Vital Signs Time Taken: 10:13 Temperature (F): 97.9 Height (in): 62 Pulse (bpm): 82 Weight (lbs): 105 Respiratory Rate (breaths/min): 16 Body Mass Index (BMI): 19.2 Blood Pressure (mmHg): 121/83 Reference Range: 80 - 120 mg / dl Electronic Signature(s) Signed: 03/23/2022 9:39:47 AM By: Massie Kluver Entered By: Massie Kluver on 03/22/2022  10:13:19

## 2022-03-25 ENCOUNTER — Ambulatory Visit (INDEPENDENT_AMBULATORY_CARE_PROVIDER_SITE_OTHER): Payer: Medicare Other

## 2022-03-25 ENCOUNTER — Ambulatory Visit
Admission: EM | Admit: 2022-03-25 | Discharge: 2022-03-25 | Disposition: A | Discharge status: Home / Self Care | Payer: Medicare Other | Attending: Nurse Practitioner | Admitting: Nurse Practitioner

## 2022-03-25 ENCOUNTER — Other Ambulatory Visit: Payer: Self-pay

## 2022-03-25 ENCOUNTER — Encounter: Payer: Self-pay | Admitting: Emergency Medicine

## 2022-03-25 DIAGNOSIS — R059 Cough, unspecified: Secondary | ICD-10-CM | POA: Diagnosis not present

## 2022-03-25 DIAGNOSIS — R0602 Shortness of breath: Secondary | ICD-10-CM

## 2022-03-25 DIAGNOSIS — J441 Chronic obstructive pulmonary disease with (acute) exacerbation: Secondary | ICD-10-CM

## 2022-03-25 MED ORDER — IPRATROPIUM-ALBUTEROL 0.5-2.5 (3) MG/3ML IN SOLN
3.0000 mL | Freq: Once | RESPIRATORY_TRACT | Status: AC
  Administered 2022-03-25: 3 mL via RESPIRATORY_TRACT

## 2022-03-25 MED ORDER — AMOXICILLIN-POT CLAVULANATE 875-125 MG PO TABS: 1.0000 | ORAL_TABLET | Freq: Two times a day (BID) | ORAL | 0 refills | Status: AC

## 2022-03-25 MED ORDER — METHYLPREDNISOLONE SODIUM SUCC 125 MG IJ SOLR
60.0000 mg | Freq: Once | INTRAMUSCULAR | Status: AC
  Administered 2022-03-25: 60 mg via INTRAMUSCULAR

## 2022-03-25 MED ORDER — PREDNISONE 20 MG PO TABS: 40.0000 mg | ORAL_TABLET | Freq: Every day | ORAL | 0 refills | Status: AC

## 2022-03-25 NOTE — Discharge Instructions (Addendum)
Take medication as prescribed. May take over-the-counter Tylenol as needed for pain, fever, general discomfort. Continue your current nebulizer treatment and albuterol inhaler as needed for shortness of breath or wheezing. Recommend use of a humidifier in her bedroom at nighttime during sleep and having her sleep elevated on pillows while cough and shortness of breath persist. If she develops worsening shortness of breath, difficulty breathing, worsening wheezing, or becomes unable to speak in a complete sentence, please go to the emergency department immediately for further evaluation. Recommend follow-up with her primary care physician within the next 5 to 7 days for reevaluation.  Recommend speaking with the primary care physician regarding referral to pulmonology. Follow-up as needed.

## 2022-03-25 NOTE — ED Triage Notes (Signed)
SOB on exertion since Tuesday.  Has been taking her emergency inhaler and nebulizer solution.  States has been coughing a lot lately.  States cough is productive at times.

## 2022-03-25 NOTE — ED Provider Notes (Signed)
[facility]    CSN: [id number] Arrival date & time: 03/25/22  1645      History   Chief Complaint No chief complaint on file.   HPI [name] is a 88 y.o. female.   The history is provided by the patient and a relative (daughter).   The patient is a 22-year-old female with a history of dementia, COPD, GERD, chronic diastolic heart failure, pulmonary fibrosis hyperlipidemia, hiatal hernia, urinary retention, chronic constipation who presents with shortness of breath.  The patient's daughter states that the patient's shortness of breath has been present for the past 2 days.  Patient's daughter states patient has been shortness of breath when at rest and when she is walking.  Patient's daughter also endorses wheezing.  Patient has been using her nebulizer treatment along with her rescue inhaler at home.  Patient's daughter denies fever, chills, chest pain, abdominal pain, nausea, vomiting, diarrhea, or constipation.  Patient's most recent COPD exacerbation was on 02/28/2022, at which time she did go to the emergency department via EMS.  Patient was discharged to home from the ER.  Patient's daughter states symptoms initially improved.  Patient's daughter states patient currently does not have a pulmonologist.  Past Medical History:  Diagnosis Date   Anxiety    Chronic diastolic (congestive) heart failure (HCC)    Chronic obstructive pulmonary disease, unspecified (HCC)    COPD (chronic obstructive pulmonary disease) (HCC)    GERD (gastroesophageal reflux disease)    Hyperlipidemia    IBS (irritable bowel syndrome)    Lumbago with sciatica    Osteoporosis    Overactive bladder    Pneumonia    Pulmonary fibrosis ([place])     Patient Active Problem List   Diagnosis Date Noted   Anemia, unspecified 11/23/2021   Long term (current) use of inhaled steroids 11/23/2021   Unspecified dementia, unspecified severity, with mood disturbance ([place]) 11/23/2021   Fracture of femoral  neck, left, closed ([place]) 09/24/2021   Acute respiratory failure with hypoxia ([place]) 06/10/2021   Tobacco use disorder 06/10/2021   Generalized weakness 06/10/2021   Bilateral conjunctivitis 06/10/2021   Chronic diastolic CHF (congestive heart failure) ([place]) 05/14/2021   Depression 05/14/2021   Hyperlipidemia    COPD exacerbation ([place]) 05/13/2021   Long term (current) use of opiate analgesic 01/04/2021   Chronic pain 04/23/2019   General unsteadiness 04/23/2019   Polyarthropathy 04/23/2019   Pseudobulbar affect 04/23/2019   Tremor 04/23/2019   Chronic obstructive pulmonary disease, unspecified ([place])    Gastro-esophageal reflux disease without esophagitis    Hyperlipidemia, unspecified    Pulmonary fibrosis, unspecified (HCC)    Acute on chronic diastolic (congestive) heart failure (HCC)    Age-related osteoporosis without current pathological fracture    Anxiety disorder, unspecified    Chest pain, unspecified    Chronic diastolic (congestive) heart failure (HCC)    Collapsed vertebra, not elsewhere classified, site unspecified, subsequent encounter for fracture with routine healing    Edema, unspecified    Lumbago with sciatica    Overactive bladder    Pain in unspecified hand    Pressure ulcer of unspecified site, unspecified stage    Unspecified protein-calorie malnutrition ([place])    Acute diastolic heart failure ([place]) 09/30/2018   Dyspnea 09/29/2018   Pneumonia 06/30/2018   Acute CHF (congestive heart failure) ([place]) 06/29/2018   Vascular dementia without behavioral disturbance ([place]) 06/26/2018   CAP (community acquired pneumonia) 06/23/2018   Bilateral pleural effusion 06/23/2018   Leukocytosis 06/20/2018  Chronic anemia 06/20/2018   Dementia ([place]) 06/19/2018   Bilateral lower extremity edema 06/18/2018   Protein-calorie malnutrition, severe ([place]) 06/18/2018   S/P internal fixation right hip fracture cannulated screw placement 04/06/18 04/27/2018   Urinary tract infection  associated with catheterization of urinary tract ([place]) 04/11/2018   Altered mental status 04/10/2018   Anxiety 04/10/2018   Hyponatremia 04/10/2018   UTI (urinary tract infection) due to urinary indwelling catheter ([place]) 04/10/2018   Chronic constipation 04/09/2018   Urinary retention 04/09/2018   Closed right hip fracture ([place]) 04/05/2018   GERD (gastroesophageal reflux disease) 04/05/2018   COPD with acute exacerbation ([place]) 04/05/2018   Hiatal hernia    Dyspepsia 04/04/2014   Hematochezia 04/30/2011   RUQ pain 03/25/2011   Adenomatous polyps 03/25/2011   ABDOMINAL PAIN, LEFT LOWER QUADRANT 10/16/2009   HYPERLIPIDEMIA 10/09/2009   BRONCHITIS 10/09/2009   Osteoporosis 10/09/2009   Personal history of pneumonia (recurrent) 01/05/1999   Mixed hyperlipidemia 01/05/1999    Past Surgical History:  Procedure Laterality Date   COLONOSCOPY  10/04/2009   sigmoid diverticula, multiple tubulovillous adneomas, needs surveillance Oct 2013   ESOPHAGOGASTRODUODENOSCOPY  04/16/2011   Dr. Gala Romney: Noncritical Schatzkis ring( not manipulated because no dysphagia). Normal esophagus otherwise  large hiatal hernia. Gastric polyp-status post biopsy. Gastric erosions-staus post biopsy. Abnormal bulb-status post biopsy. benign small bowel, stomach biopsy with ulcerated gastric antral mucosa with foveolar hyperplasia and surface erosion, polyp with inflamed gastric antral mucosa    ESOPHAGOGASTRODUODENOSCOPY N/A 04/29/2014   Dr. Gala Romney: Noncritical Schatzki's ring large hiatal hernia. Retained gastric contents.GES with slight delay   femur fracture Left 2023   HIP ARTHROPLASTY Left 09/24/2021   Procedure: ARTHROPLASTY BIPOLAR HIP (HEMIARTHROPLASTY);  Surgeon: Mordecai Rasmussen, MD;  Location: [facility];  Service: Orthopedics;  Laterality: Left;   HIP PINNING,CANNULATED Right 04/06/2018   Procedure: CANNULATED HIP PINNING;  Surgeon: Carole Civil, MD;  Location: [facility];  Service: Orthopedics;  Laterality:  Right;    OB History     Gravida  3   Para  3   Term  3   Preterm  0   AB  0   Living         SAB  0   IAB  0   Ectopic  0   Multiple      Live Births               Home Medications    Prior to Admission medications   Medication Sig Start Date End Date Taking? Authorizing Provider  amoxicillin-clavulanate (AUGMENTIN) 875-125 MG tablet Take 1 tablet by mouth every 12 (twelve) hours for 5 days. 03/25/22 03/30/22 Yes Glorine Hanratty-Warren, Alda Lea, NP  predniSONE (DELTASONE) 20 MG tablet Take 2 tablets (40 mg total) by mouth daily with breakfast for 5 days. 03/25/22 03/30/22 Yes Felishia Wartman-Warren, Alda Lea, NP  albuterol (PROAIR HFA) 108 (90 Base) MCG/ACT inhaler Inhale 2 puffs into the lungs every 4 (four) hours as needed for wheezing or shortness of breath. Patient taking differently: Inhale 2 puffs into the lungs every 6 (six) hours as needed for wheezing or shortness of breath. 06/12/21   Johnson, Clanford L, MD  albuterol (PROVENTIL) (2.5 MG/3ML) 0.083% nebulizer solution Take 3 mLs (2.5 mg total) by nebulization every 6 (six) hours as needed for wheezing or shortness of breath. Patient taking differently: Take 2.5 mg by nebulization in the morning, at noon, in the evening, and at bedtime. 06/23/18   Gerlene Fee, NP  ALPRAZolam Duanne Moron) 0.5  MG tablet Take 1 tablet (0.5 mg total) by mouth 2 (two) times daily. 09/30/21   Shelly Coss, MD  azithromycin (ZITHROMAX) 250 MG tablet Take 1 tablet (250 mg total) by mouth daily for 5 days. 02/28/22 03/05/22  Audley Hose, MD  collagenase (SANTYL) 250 UNIT/GM ointment Apply 1 Application topically daily. 02/02/22   Felipa Furnace, DPM  diclofenac Sodium (VOLTAREN) 1 % GEL APPLY AS DIRECTED TO THE AFFECTED KNEE FOUR TIMES DAILY. Patient taking differently: Apply 4 g topically 4 (four) times daily. 08/04/20   Carole Civil, MD  donepezil (ARICEPT) 10 MG tablet Take 10 mg by mouth daily. 07/25/19   [provider]  ferrous  sulfate 325 (65 FE) MG tablet Take 1 tablet (325 mg total) by mouth daily. 09/30/21 09/30/22  Shelly Coss, MD  furosemide (LASIX) 40 MG tablet Take 40 mg by mouth daily.    [provider]  ipratropium (ATROVENT) 0.02 % nebulizer solution Take 2.5 mLs (0.5 mg total) by nebulization every 6 (six) hours as needed for wheezing or shortness of breath. 09/30/21   Shelly Coss, MD  LINZESS 145 MCG CAPS capsule Take 145 mcg by mouth daily as needed (bowel movement). 05/22/21   [provider]  loratadine (CLARITIN) 10 MG tablet Take 10 mg by mouth daily. 08/29/21   [provider]  memantine (NAMENDA) 10 MG tablet Take 10 mg by mouth 2 (two) times daily.    [provider]  Multiple Vitamin (MULTIVITAMIN WITH MINERALS) TABS tablet Take 1 tablet by mouth daily.    [provider]  mupirocin ointment (BACTROBAN) 2 % Apply 1 Application topically daily. 01/12/22   McDonald, Stephan Minister, DPM  pantoprazole (PROTONIX) 40 MG tablet TAKE (1) TABLET BY MOUTH TWICE A DAY WITH MEALS (BREAKFAST AND SUPPER) Patient taking differently: Take 40 mg by mouth daily. 05/24/19   Carlis Stable, NP  polyethylene glycol (MIRALAX / GLYCOLAX) 17 g packet Take 17 g by mouth daily. 09/30/21   Shelly Coss, MD  potassium chloride (K-DUR) 10 MEQ tablet Take 1 tablet (10 mEq total) by mouth daily. 06/23/18   Gerlene Fee, NP  simvastatin (ZOCOR) 10 MG tablet Take 5 mg by mouth daily. 02/27/21   [provider]  traMADol (ULTRAM) 50 MG tablet Take 1 tablet (50 mg total) by mouth every 8 (eight) hours as needed for moderate pain. 09/30/21   Shelly Coss, MD  umeclidinium-vilanterol (ANORO ELLIPTA) 62.5-25 MCG/INH AEPB Inhale 1 puff into the lungs daily. 06/23/18   Gerlene Fee, NP  zolpidem (AMBIEN) 5 MG tablet Take 5 mg by mouth at bedtime as needed for sleep.    [provider]    Family History Family History  Problem Relation Age of Onset   Stroke Mother     Diabetes Mother    Heart disease Father    Colon cancer Neg Hx     Social History Social History   Tobacco Use   Smoking status: Former    Types: Cigarettes    Passive exposure: Yes   Smokeless tobacco: Current    Types: Snuff  Vaping Use   Vaping Use: Never used  Substance Use Topics   Alcohol use: No   Drug use: No     Allergies   Codeine, Codeine, Levofloxacin, Sulfonamide derivatives, Doxycycline, Septra [sulfamethoxazole-trimethoprim], Sulfa antibiotics, and Trimethoprim   Review of Systems Review of Systems Per HPI  Physical Exam Triage Vital Signs ED Triage Vitals  Enc Vitals Group  BP 03/25/22 1651 132/84     Pulse Rate 03/25/22 1651 84     Resp 03/25/22 1651 20     Temp 03/25/22 1651 97.7 F (36.5 C)     Temp Source 03/25/22 1651 Oral     SpO2 03/25/22 1651 92 %     Weight --      Height --      Head Circumference --      Peak Flow --      Pain Score 03/25/22 1653 0     Pain Loc --      Pain Edu? --      Excl. in [place]? --    No data found.  Updated Vital Signs BP 132/84 (BP Location: Right Arm)   Pulse 85   Temp 97.7 F (36.5 C) (Oral)   Resp 20   SpO2 98%   Visual Acuity Right Eye Distance:   Left Eye Distance:   Bilateral Distance:    Right Eye Near:   Left Eye Near:    Bilateral Near:     Physical Exam Vitals and nursing note reviewed.  Constitutional:      General: She is not in acute distress.    Appearance: Normal appearance.  HENT:     Mouth/Throat:     Mouth: Mucous membranes are moist.  Eyes:     Extraocular Movements: Extraocular movements intact.     Conjunctiva/sclera: Conjunctivae normal.     Pupils: Pupils are equal, round, and reactive to light.  Cardiovascular:     Rate and Rhythm: Normal rate and regular rhythm.     Pulses: Normal pulses.     Heart sounds: Normal heart sounds.  Pulmonary:     Effort: Pulmonary effort is normal. No respiratory distress.     Breath sounds: Examination of the right-lower  field reveals decreased breath sounds. Examination of the left-lower field reveals decreased breath sounds. Decreased breath sounds present. No wheezing, rhonchi or rales.     Comments: Mild labored breathing noted.  Patient is speaking in complete sentences. Abdominal:     General: Bowel sounds are normal.     Palpations: Abdomen is soft.     Tenderness: There is no abdominal tenderness.  Musculoskeletal:     Cervical back: Normal range of motion.  Lymphadenopathy:     Cervical: No cervical adenopathy.  Skin:    General: Skin is warm and dry.  Neurological:     General: No focal deficit present.     Mental Status: She is alert and oriented to person, place, and time.  Psychiatric:        Mood and Affect: Mood normal.        Behavior: Behavior normal.      UC Treatments / Results  Labs (all labs ordered are listed, but only abnormal results are displayed) Labs Reviewed - No data to display  EKG: Normal sinus rhythm with first-degree AV block, no STEMI.  Compared to EKG performed on 02/28/2022.   Radiology DG Chest 2 View  Result Date: 03/25/2022 CLINICAL DATA:  Cough and shortness of breath for 4 days. EXAM: CHEST - 2 VIEW COMPARISON:  Chest x-ray 02/28/2022 FINDINGS: Once again diffuse interstitial changes seen in the lungs. Blunting of the costophrenic angles. Tiny effusion versus pleural thickening. Film is rotated. No pneumothorax or consolidation. Stable cardiopericardial silhouette with calcified aorta. Double density overlying the heart. Please correlate for a hiatal hernia. Osteopenia identified. Left-sided old rib fractures. There is questionable lucency along the  right humeral head. This could be projectional. Please correlate with any particular history or dedicated shoulder x-ray when appropriate. Slight compression of multiple thoracic spine levels. IMPRESSION: Chronic interstitial changes seen. Tiny effusion versus pleural thickening. Possible hiatal hernia. Osteopenia  with degenerative changes. There is some lucency overlying the right humeral head but this could be projectional and technical. Please correlate with any history. Dedicated shoulder x-ray as clinically appropriate. Compression of multiple thoracic spine levels. Electronically Signed   By: Jill Side M.D.   On: 03/25/2022 17:27    Procedures Procedures (including critical care time)  Medications Ordered in UC Medications  ipratropium-albuterol (DUONEB) 0.5-2.5 (3) MG/3ML nebulizer solution 3 mL (3 mLs Nebulization Given 03/25/22 1739)  methylPREDNISolone sodium succinate (SOLU-MEDROL) 125 mg/2 mL injection 60 mg (60 mg Intramuscular Given 03/25/22 1737)    Initial Impression / Assessment and Plan / UC Course  I have reviewed the triage vital signs and the nursing notes.  Pertinent labs & imaging results that were available during my care of the patient were reviewed by me and considered in my medical decision making (see chart for details).  Patient with complaints of shortness of breath with activity and at rest for the past 2 days.  Chest x-ray shows consistent interstitial changes.  This is consistent with patient's recent diagnosis of pulmonary fibrosis and COPD.  EKG normal sinus rhythm with first-degree AV block, no STEMI.  Compared to EKG performed on 02/28/2022.  No pneumonia noted on the chest x-ray.  Patient was administered DuoNeb and Solu-Medrol 60 mg IM.  Symptoms are consistent with COPD exacerbation, will forego viral testing as this will not change the course of treatment at this time.  Patient prescribed an additional round of prednisone 40 mg for the next 5 days, and Augmentin 875/125 mg for the next 5 days.  Patient will continue her current nebulizer treatment and rescue inhaler regimen.  Patient's daughter was advised patient will need to follow-up with her primary care physician within the next 5 to 7 days for reevaluation.  Patient's daughter also advised to speak with the  patient's primary care physician regarding referral to pulmonology.  Patient's daughter was given strict ER follow-up precautions.  Patient's daughter verbalizes understanding and is in agreement with this plan of care.  All questions were answered.  Patient stable for discharge.   Final Clinical Impressions(s) / UC Diagnoses   Final diagnoses:  COPD exacerbation ([place])  SOB (shortness of breath)     Discharge Instructions      Take medication as prescribed. May take over-the-counter Tylenol as needed for pain, fever, general discomfort. Continue your current nebulizer treatment and albuterol inhaler as needed for shortness of breath or wheezing. Recommend use of a humidifier in her bedroom at nighttime during sleep and having her sleep elevated on pillows while cough and shortness of breath persist. If she develops worsening shortness of breath, difficulty breathing, worsening wheezing, or becomes unable to speak in a complete sentence, please go to the emergency department immediately for further evaluation. Recommend follow-up with her primary care physician within the next 5 to 7 days for reevaluation.  Recommend speaking with the primary care physician regarding referral to pulmonology. Follow-up as needed.     ED Prescriptions     Medication Sig Dispense Auth. Provider   amoxicillin-clavulanate (AUGMENTIN) 875-125 MG tablet Take 1 tablet by mouth every 12 (twelve) hours for 5 days. 10 tablet Yasmyn Bellisario-Warren, Alda Lea, NP   predniSONE (DELTASONE) 20 MG tablet Take 2  tablets (40 mg total) by mouth daily with breakfast for 5 days. 10 tablet Pantelis Elgersma-Warren, Alda Lea, NP      PDMP not reviewed this encounter.   Tish Men, NP 03/25/22 1755

## 2022-03-29 ENCOUNTER — Encounter: Payer: Medicare Other | Admitting: Physician Assistant

## 2022-03-29 DIAGNOSIS — L89893 Pressure ulcer of other site, stage 3: Secondary | ICD-10-CM | POA: Diagnosis not present

## 2022-03-29 NOTE — Progress Notes (Signed)
[name], [name] ([id number] Page 1 of 7 Visit Report for 03/29/2022 Arrival Information Details Patient Name: Date of Service: [name], [name] 03/29/2022 10:45 A M Medical Record Number: [id number] Patient Account Number: [id number] Date of Birth/Sex: Treating RN: 1934-04-20 (88 y.o. [name] Primary Care Kiyani Jernigan: Denyce Robert Other Clinician: Massie Kluver Referring Jeremi Losito: Treating Kym Scannell/Extender: Norwood Levo Weeks in Treatment: 5 Visit Information History Since Last Visit All ordered tests and consults were completed: No Patient Arrived: Wheel Chair Added or deleted any medications: No Arrival Time: 10:49 Any new allergies or adverse reactions: No Transfer Assistance: EasyPivot Patient Lift Had a fall or experienced change in No Patient Identification Verified: Yes activities of daily living that may affect Secondary Verification Process Completed: Yes risk of falls: Patient Requires Transmission-Based Precautions: No Signs or symptoms of abuse/neglect since last visito No Patient Has Alerts: No Hospitalized since last visit: No Implantable device outside of the clinic excluding No cellular tissue based products placed in the center since last visit: Pain Present Now: No Electronic Signature(s) Signed: 03/29/2022 5:19:29 PM By: Massie Kluver Entered By: Massie Kluver on 03/29/2022 10:55:50 -------------------------------------------------------------------------------- Clinic Level of Care Assessment Details Patient Name: Date of Service: Lacey V IS, [name] 03/29/2022 10:45 A M Medical Record Number: [id number] Patient Account Number: [id number] Date of Birth/Sex: Treating RN: 1934/08/01 (88 y.o. [name] Primary Care Garth Diffley: Denyce Robert Other Clinician: Massie Kluver Referring Agnes Probert: Treating Lindon Kiel/Extender: Norwood Levo Weeks in Treatment: 5 Clinic Level of Care  Assessment Items TOOL 1 Quantity Score []  - 0 Use when EandM and Procedure is performed on INITIAL visit ASSESSMENTS - Nursing Assessment / Reassessment []  - 0 General Physical Exam (combine w/ comprehensive assessment (listed just below) when performed on new pt. evals) []  - 0 Comprehensive Assessment (HX, ROS, Risk Assessments, Wounds Hx, etc.) ASSESSMENTS - Wound and Skin Assessment / Reassessment []  - 0 Dermatologic / Skin Assessment (not related to wound area) ASSESSMENTS - Ostomy and/or Continence Assessment and Care []  - 0 Incontinence Assessment and Management []  - 0 Ostomy Care Assessment and Management (repouching, etc.) PROCESS - Coordination of Care []  - 0 Simple Patient / Family Education for ongoing care []  - 0 Complex (extensive) Patient / Family Education for ongoing care []  - 0 Staff obtains Programmer, systems, Records, T Results / Process Orders est []  - 0 Staff telephones HHA, Nursing Homes / Clarify orders / etc []  - 0 Routine Transfer to another Facility (non-emergent condition) []  - 0 Routine Hospital Admission (non-emergent condition) []  - 0 New Admissions / Insurance Authorizations / Ordering NPWT Apligraf, etc. , [place], [place] ([id number] Page 2 of 7 []  - 0 Emergency Hospital Admission (emergent condition) PROCESS - Special Needs []  - 0 Pediatric / Minor Patient Management []  - 0 Isolation Patient Management []  - 0 Hearing / Language / Visual special needs []  - 0 Assessment of Community assistance (transportation, D/C planning, etc.) []  - 0 Additional assistance / Altered mentation []  - 0 Support Surface(s) Assessment (bed, cushion, seat, etc.) INTERVENTIONS - Miscellaneous []  - 0 External ear exam []  - 0 Patient Transfer (multiple staff / Civil Service fast streamer / Similar devices) []  - 0 Simple Staple / Suture removal (25 or less) []  - 0 Complex Staple / Suture removal (26 or more) []  - 0 Hypo/Hyperglycemic  Management (do not check if billed separately) []  - 0 Ankle / Brachial Index (ABI) - do not check if billed separately Has the patient been  seen at the hospital within the last three years: Yes Total Score: 0 Level Of Care: ____ Electronic Signature(s) Signed: 03/29/2022 5:19:29 PM By: Massie Kluver Entered By: Massie Kluver on 03/29/2022 11:29:57 -------------------------------------------------------------------------------- Encounter Discharge Information Details Patient Name: Date of Service: Lacey V IS, [name] 03/29/2022 10:45 A M Medical Record Number: [id number] Patient Account Number: [id number] Date of Birth/Sex: Treating RN: Mar 15, 1934 (88 y.o. [name] Primary Care Gisella Alwine: Denyce Robert Other Clinician: Massie Kluver Referring Fields Oros: Treating Rebecca Cairns/Extender: Norwood Levo Weeks in Treatment: 5 Encounter Discharge Information Items Post Procedure Vitals Discharge Condition: Stable Temperature (F): 97.8 Ambulatory Status: Wheelchair Pulse (bpm): 83 Discharge Destination: Home Respiratory Rate (breaths/min): 18 Transportation: Private Auto Blood Pressure (mmHg): 134/84 Accompanied By: daughter Schedule Follow-up Appointment: Yes Clinical Summary of Care: Electronic Signature(s) Signed: 03/29/2022 5:19:29 PM By: Massie Kluver Entered By: Massie Kluver on 03/29/2022 11:43:04 -------------------------------------------------------------------------------- Lower Extremity Assessment Details Patient Name: Date of Service: Lacey V IS, [name] 03/29/2022 10:45 A M Medical Record Number: [id number] Patient Account Number: [id number] Date of Birth/Sex: Treating RN: 28-Oct-1934 (88 y.o. [name] Primary Care Melinna Linarez: Denyce Robert Other Clinician: Massie Kluver Referring Mireille Lacombe: Treating Arek Spadafore/Extender: Norwood Levo Weeks in Treatment: 5 Edema Assessment Assessed: [Left: Yes] [Right: No] Edema: [Left: Ye] [Right:  s] D[Left: AVIS, Shawna L [id number] [[id number] Page 3 of 7] Calf Left: Right: Point of Measurement: 32 cm From Medial Instep 27.6 cm Ankle Left: Right: Point of Measurement: 10 cm From Medial Instep 19.5 cm Vascular Assessment Pulses: Dorsalis Pedis Palpable: [Left:Yes] Electronic Signature(s) Signed: 03/29/2022 4:47:10 PM By: Gretta Cool, BSN, RN, CWS, [name] RN, BSN Signed: 03/29/2022 5:19:29 PM By: Massie Kluver Entered By: Massie Kluver on 03/29/2022 11:05:13 -------------------------------------------------------------------------------- Multi Wound Chart Details Patient Name: Date of Service: Lacey V IS, Rikia L. 03/29/2022 10:45 A M Medical Record Number: [id number] Patient Account Number: [id number] Date of Birth/Sex: Treating RN: 21-Jul-1934 (88 y.o. [name] Primary Care Marai Teehan: Denyce Robert Other Clinician: Massie Kluver Referring Tayley Mudrick: Treating Brailen Macneal/Extender: Norwood Levo Weeks in Treatment: 5 Vital Signs Height(in): 62 Pulse(bpm): 83 Weight(lbs): 105 Blood Pressure(mmHg): 134/84 Body Mass Index(BMI): 19.2 Temperature(F): 97.8 Respiratory Rate(breaths/min): 18 [1:Photos:] [N/A:N/A] Left, Distal, Medial Foot N/A N/A Wound Location: Gradually Appeared N/A N/A Wounding Event: Pressure Ulcer N/A N/A Primary Etiology: Chronic Obstructive Pulmonary N/A N/A Comorbid History: Disease (COPD), Congestive Heart Failure, Dementia 01/22/2022 N/A N/A Date Acquired: 5 N/A N/A Weeks of Treatment: Open N/A N/A Wound Status: No N/A N/A Wound Recurrence: 0.4x0.4x0.1 N/A N/A Measurements L x W x D (cm) 0.126 N/A N/A A (cm) : rea 0.013 N/A N/A Volume (cm) : -77.50% N/A N/A % Reduction in A rea: 7.10% N/A N/A % Reduction in Volume: Category/Stage III N/A N/A Classification: Medium N/A N/A Exudate A mount: Serosanguineous N/A N/A Exudate Type: red, brown N/A N/A Exudate Color: Small (1-33%) N/A  N/A Granulation A mount: Pink N/A N/A Granulation Quality: Large (67-100%) N/A N/A Necrotic A mount: Fat Layer (Subcutaneous Tissue): Yes N/A N/A Exposed Structures: Fascia: No Tendon: No Steely, Shakari L ([id number] Page 4 of 7 Muscle: No Joint: No Bone: No None N/A N/A Epithelialization: Treatment Notes Electronic Signature(s) Signed: 03/29/2022 5:19:29 PM By: Massie Kluver Entered By: Massie Kluver on 03/29/2022 11:05:16 -------------------------------------------------------------------------------- Multi-Disciplinary Care Plan Details Patient Name: Date of Service: Lacey V IS, [place]. 03/29/2022 10:45 A M Medical Record Number: [id number] Patient Account Number: [id number] Date of Birth/Sex: Treating RN: [birth year] (88 y.o. [name] Primary  Care Shadell Brenn: Denyce Robert Other Clinician: Massie Kluver Referring Kailon Treese: Treating Roslynn Holte/Extender: Norwood Levo Weeks in Treatment: 5 Active Inactive Abuse / Safety / Falls / Self Care Management Nursing Diagnoses: Potential for injury related to falls Goals: Patient will remain injury free related to falls Date Initiated: 02/22/2022 Target Resolution Date: 03/23/2022 Goal Status: Active Interventions: Assess Activities of Daily Living upon admission and as needed Assess fall risk on admission and as needed Assess: immobility, friction, shearing, incontinence upon admission and as needed Assess impairment of mobility on admission and as needed per policy Assess personal safety and home safety (as indicated) on admission and as needed Assess self care needs on admission and as needed Notes: Necrotic Tissue Nursing Diagnoses: Knowledge deficit related to management of necrotic/devitalized tissue Goals: Necrotic/devitalized tissue will be minimized in the wound bed Date Initiated: 02/22/2022 Target Resolution Date: 03/23/2022 Goal Status:  Active Interventions: Assess patient pain level pre-, during and post procedure and prior to discharge Provide education on necrotic tissue and debridement process Notes: Wound/Skin Impairment Nursing Diagnoses: Knowledge deficit related to ulceration/compromised skin integrity Goals: Patient/caregiver will verbalize understanding of skin care regimen Date Initiated: 02/22/2022 Target Resolution Date: 03/23/2022 Goal Status: Active Ulcer/skin breakdown will have a volume reduction of 30% by week 4 Date Initiated: 02/22/2022 Target Resolution Date: 03/23/2022 Goal Status: Active MYAMI, LUGARDO ([id number]) [phone].pdf Page 5 of 7 Ulcer/skin breakdown will have a volume reduction of 50% by week 8 Date Initiated: 02/22/2022 Target Resolution Date: 04/23/2022 Goal Status: Active Ulcer/skin breakdown will have a volume reduction of 80% by week 12 Date Initiated: 02/22/2022 Target Resolution Date: 05/23/2022 Goal Status: Active Ulcer/skin breakdown will heal within 14 weeks Date Initiated: 02/22/2022 Target Resolution Date: 06/23/2022 Goal Status: Active Interventions: Assess patient/caregiver ability to obtain necessary supplies Assess patient/caregiver ability to perform ulcer/skin care regimen upon admission and as needed Assess ulceration(s) every visit Notes: Electronic Signature(s) Signed: 03/29/2022 4:47:10 PM By: Gretta Cool, BSN, RN, CWS, [name] RN, BSN Signed: 03/29/2022 5:19:29 PM By: Massie Kluver Entered By: Massie Kluver on 03/29/2022 11:30:05 -------------------------------------------------------------------------------- Pain Assessment Details Patient Name: Date of Service: Lacey V IS, Nusrat L. 03/29/2022 10:45 A M Medical Record Number: [id number] Patient Account Number: [id number] Date of Birth/Sex: Treating RN: April 16, 1934 (88 y.o. [name] Primary Care Francine Hannan: Denyce Robert Other Clinician: Massie Kluver Referring Vint Pola: Treating  Tyshawna Alarid/Extender: Norwood Levo Weeks in Treatment: 5 Active Problems Location of Pain Severity and Description of Pain Patient Has Paino No Site Locations Pain Management and Medication Current Pain Management: Electronic Signature(s) Signed: 03/29/2022 4:47:10 PM By: Gretta Cool, BSN, RN, CWS, [name] RN, BSN Signed: 03/29/2022 5:19:29 PM By: Massie Kluver Entered By: Massie Kluver on 03/29/2022 10:58:51 -------------------------------------------------------------------------------- Patient/Caregiver Education Details Patient Name: Date of Service: Lacey V IS, Dilynn L. 3/25/2024andnbsp10:45 A M Medical Record Number: [id number] Patient Account Number: [id number] Date of Birth/Gender: Treating RN: 1934/05/15 (88 y.o. [name], [place] ([id number]) [phone].pdf Page 6 of 7 Primary Care Physician: Denyce Robert Other Clinician: Massie Kluver Referring Physician: Treating Physician/Extender: Clois Comber in Treatment: 5 Education Assessment Education Provided To: Patient and Caregiver Education Topics Provided Wound/Skin Impairment: Handouts: Other: continue wound care as directed Methods: Explain/Verbal Responses: State content correctly Electronic Signature(s) Signed: 03/29/2022 5:19:29 PM By: Massie Kluver Entered By: Massie Kluver on 03/29/2022 11:42:17 -------------------------------------------------------------------------------- Wound Assessment Details Patient Name: Date of Service: [place], [place]. 03/29/2022 10:45 A M Medical Record Number: [id number] Patient Account Number: [id number] Date of Birth/Sex:  Treating RN: 1934/01/31 (88 y.o. [name], [name] Primary Care Laquanta Hummel: Denyce Robert Other Clinician: Massie Kluver Referring Latayvia Mandujano: Treating Cassandr Cederberg/Extender: Norwood Levo Weeks in Treatment: 5 Wound Status Wound Number: 1 Primary Pressure Ulcer Etiology: Wound  Location: Left, Distal, Medial Foot Wound Open Wounding Event: Gradually Appeared Status: Date Acquired: 01/22/2022 Comorbid Chronic Obstructive Pulmonary Disease (COPD), Congestive Weeks Of Treatment: 5 History: Heart Failure, Dementia Clustered Wound: No Photos Wound Measurements Length: (cm) 0.4 Width: (cm) 0.4 Depth: (cm) 0.1 Area: (cm) 0.126 Volume: (cm) 0.013 % Reduction in Area: -77.5% % Reduction in Volume: 7.1% Epithelialization: None Wound Description Classification: Category/Stage III Exudate Amount: Medium Exudate Type: Serosanguineous Exudate Color: red, brown Foul Odor After Cleansing: No Slough/Fibrino Yes Wound Bed Granulation Amount: Small (1-33%) Exposed Structure Granulation Quality: Pink Fascia Exposed: No Necrotic Amount: Large (67-100%) Fat Layer (Subcutaneous Tissue) Exposed: Yes Butler, Hydeia L ([id number].pdf Page 7 of 7 Necrotic Quality: Adherent Slough Tendon Exposed: No Muscle Exposed: No Joint Exposed: No Bone Exposed: No Treatment Notes Wound #1 (Foot) Wound Laterality: Left, Medial, Distal Cleanser [place] Ancillary Kit - 15 Day Supply Discharge Instruction: Use supplies as instructed; Kit contains: (15) Saline Bullets; (15) 3x3 Gauze; 15 pr Gloves Soap and Water Discharge Instruction: Gently cleanse wound with antibacterial soap, rinse and pat dry prior to dressing wounds Peri-Wound Care Topical Primary Dressing Endoform Natural, Non-fenestrated, 2x2 (in/in) Secondary Dressing (BORDER) Zetuvit Plus SILICONE BORDER Dressing 4x4 (in/in) Discharge Instruction: Please do not put silicone bordered dressings under wraps. Use non-bordered dressing only. Secured With Compression Wrap Compression Stockings Environmental education officer) Signed: 03/29/2022 4:47:10 PM By: Gretta Cool, BSN, RN, CWS, [name] RN, BSN Signed: 03/29/2022 5:19:29 PM By: Massie Kluver Entered By: Massie Kluver on 03/29/2022  11:03:49 -------------------------------------------------------------------------------- Vitals Details Patient Name: Date of Service: Lacey V IS, Ria L. 03/29/2022 10:45 A M Medical Record Number: [id number] Patient Account Number: [id number] Date of Birth/Sex: Treating RN: 1934-04-25 (88 y.o. [name], [name] Primary Care Dot Splinter: Denyce Robert Other Clinician: Massie Kluver Referring Luceal Hollibaugh: Treating Jenevieve Kirschbaum/Extender: Norwood Levo Weeks in Treatment: 5 Vital Signs Time Taken: 10:56 Temperature (F): 97.8 Height (in): 62 Pulse (bpm): 83 Weight (lbs): 105 Respiratory Rate (breaths/min): 18 Body Mass Index (BMI): 19.2 Blood Pressure (mmHg): 134/84 Reference Range: 80 - 120 mg / dl Electronic Signature(s) Signed: 03/29/2022 5:19:29 PM By: Massie Kluver Entered By: Massie Kluver on 03/29/2022 10:58:38

## 2022-03-29 NOTE — Progress Notes (Signed)
[name], [name] ([id number].pdf Page 1 of 6 Visit Report for 03/29/2022 Chief Complaint Document Details Patient Name: Date of Service: [place] 03/29/2022 10:45 A M Medical Record Number: [id number] Patient Account Number: [id number] Date of Birth/Sex: Treating RN: [birth year] (88 y.o. [name] Primary Care Provider: Denyce [name] Other Clinician: Massie [name] Referring Provider: Treating Provider/Extender: [name] in Treatment: 5 Information Obtained from: Patient Chief Complaint Left foot pressure ulcer Electronic Signature(s) Signed: 03/29/2022 10:47:05 AM By: Lacey Keeler PA-C Entered By: [name] on 03/29/2022 10:47:05 -------------------------------------------------------------------------------- Debridement Details Patient Name: Date of Service: [name], [name]. 03/29/2022 10:45 A M Medical Record Number: [id number] Patient Account Number: [id number] Date of Birth/Sex: Treating RN: 04/17/1934 (88 y.o. [name] Primary Care Provider: Denyce [name] Other Clinician: Massie [name] Referring Provider: Treating Provider/Extender: [name] in Treatment: 5 Debridement Performed for Assessment: Wound #1 Left,Distal,Medial Foot Performed By: Physician [name]., PA-C Debridement Type: Debridement Level of Consciousness (Pre-procedure): Awake and Alert Pre-procedure Verification/Time Out Yes - 11:27 Taken: Start Time: 11:27 T Area Debrided ([name] x W): otal 1 (cm) x 1 (cm) = 1 (cm) Tissue and other material debrided: Viable, Non-Viable, Callus, Slough, Slough Level: Non-Viable Tissue Debridement Description: Selective/Open Wound Instrument: Curette Bleeding: Minimum Hemostasis Achieved: Pressure Response to Treatment: Procedure was tolerated well Level of Consciousness (Post- Awake and Alert procedure): Post Debridement Measurements of Total  Wound Length: (cm) 0.4 Stage: Category/Stage III Width: (cm) 0.4 Depth: (cm) 0.1 Volume: (cm) 0.013 Character of Wound/Ulcer Post Debridement: Stable Post Procedure Diagnosis Same as Pre-procedure Electronic Signature(s) Signed: 03/29/2022 4:46:02 PM By: Lacey Keeler PA-C Signed: 03/29/2022 4:47:10 PM By: [name], BSN, RN, CWS, [name] RN, BSN Signed: 03/29/2022 5:19:29 PM By: [name] Entered By: [name] on 03/29/2022 11:29:27 [name] ([id number] Page 2 of 6 -------------------------------------------------------------------------------- HPI Details Patient Name: Date of Service: [name] 03/29/2022 10:45 A M Medical Record Number: [id number] Patient Account Number: [id number] Date of Birth/Sex: Treating RN: 1934-11-01 (88 y.o. [name] Primary Care Provider: Denyce [name] Other Clinician: Massie [name] Referring Provider: Treating Provider/Extender: [name] in Treatment: 5 History of Present Illness HPI Description: 02-22-2022 upon evaluation today patient appears to be doing somewhat poorly in regard to her left medial first metatarsal head where she does have a bunion. Unfortunately this [name] causing some pressure and has led to a pressure injury at this site. Fortunately I do not see any signs of infection at this point but that [name] something we definitely can need to keep a close eye on. Fortunately there does not appear to be any signs of active infection locally nor systemically at this point. No fevers, chills, nausea, vomiting, or diarrhea. Patient does have a history of of vascular dementia, COPD, congestive heart failure, and the dementia does affect her ability to be able to in some cases follow directions. She [name] very nervous about things in general. This [name] stated to have started somewhere around January 22, 2022. 03-02-2022 upon evaluation today patient appears to be doing  well currently in regard to her wounds all things considered. Fortunately I do not see any signs of infection unfortunately both wounds are still open and the one on the medial foot first metatarsal location [name] still the worst. Although the area over the fifth toe right foot actually [name] also open at this point unfortunately. With that being said  I do think we can need to continue to monitor for any signs of worsening or infection although right now I think she [name] doing okay in that regard. 3/4; this [name] a patient with a bunion wound on the left medial foot. She also had a small blister on the right lateral fifth toe. She went for x-rays last week no acute fracture or dislocation was seen on the left side she had worsened moderate hallucis valgus and moderate second and third fourth fifth tarsometatarsal osteoarthritis. We use Prisma. 3/11; patient presents for follow-up. She has been using endoform to the left medial foot wound. She has cushion pads that she uses in between the toes to help offload the areas and prevent future wounds. The right lateral fifth met head remains closed. 03-22-2022 upon evaluation today patient appears to be doing well currently in regard to her wound. She does show some signs of improvement here. She [name] also showing some signs of being a little bit too moist I think we may want to cut back on the hydrogel possibly just using the saline only at this point she [name] in agreement with that plan. Fortunately I do not see any evidence of active infection locally nor systemically which [name] great news. No fevers, chills, nausea, vomiting, or diarrhea. 03-29-22 upon evaluation today patient actually appears to be making excellent progress and very pleased with how things appear and how the wound [name] doing. I do think that we are moving in the right direction and very pleased in that regard. No fevers, chills, nausea, vomiting, or diarrhea. Electronic Signature(s) Signed: 03/30/2022  6:45:00 PM By: Lacey Keeler PA-C Previous Signature: 03/30/2022 6:44:46 PM Version By: Lacey Keeler PA-C Entered By: [name] on 03/30/2022 18:45:00 -------------------------------------------------------------------------------- Physical Exam Details Patient Name: Date of Service: [name], [name]. 03/29/2022 10:45 A M Medical Record Number: [id number] Patient Account Number: [id number] Date of Birth/Sex: Treating RN: [birth year] (88 y.o. [name] Primary Care Provider: Denyce [name] Other Clinician: Massie [name] Referring Provider: Treating Provider/Extender: [name] in Treatment: 5 Constitutional Well-nourished and well-hydrated in no acute distress. Respiratory normal breathing without difficulty. Psychiatric this patient [name] able to make decisions and demonstrates good insight into disease process. Alert and Oriented x 3. pleasant and cooperative. Notes Upon inspection patient's wound bed actually showed signs of good granulation epithelization at this point. Overall I am actually very pleased with where we stand I think that she [name] moving in the right direction I did perform debridement to clear away some of the necrotic debris she tolerated that today without complication and postdebridement the wound bed [name] significantly improved which [name] great news. Electronic Signature(s) Signed: 03/30/2022 6:45:13 PM By: Lacey Keeler PA-C Entered By: [name] on 03/30/2022 18:45:13 [place], [place] ([id number].pdf Page 3 of 6 -------------------------------------------------------------------------------- Physician Orders Details Patient Name: Date of Service: [name] 03/29/2022 10:45 A M Medical Record Number: [id number] Patient Account Number: [id number] Date of Birth/Sex: Treating RN: 1934/08/07 (88 y.o. [name] Primary Care Provider: Denyce [name] Other Clinician: Massie [name] Referring Provider: Treating Provider/Extender: [name] in Treatment: 5 Verbal / Phone Orders: Yes Clinician: Cornell Barman Read Back and Verified: Yes Diagnosis Coding ICD-10 Coding Code Description [phone] Pressure ulcer of other site, stage 3 I50.42 Chronic combined systolic (congestive) and diastolic (congestive) heart failure F01.511 Vascular dementia, unspecified severity, with agitation J44.9 Chronic obstructive pulmonary disease, unspecified Follow-up  Appointments Return Appointment in 1 week. Bathing/ [name]-[employer] wounds with antibacterial soap and water. Anesthetic (Use 'Patient Medications' Section for Anesthetic Order Entry) Lidocaine applied to wound bed Edema Control - Lymphedema / Segmental Compressive Device / Other Elevate, Exercise Daily and A void Standing for Long Periods of Time. Elevate legs to the level of the heart and pump ankles as often as possible Elevate leg(s) parallel to the floor when sitting. Wound Treatment Wound #1 - Foot Wound Laterality: Left, Medial, Distal Cleanser: Byram Ancillary Kit - 15 Day Supply (DME) (Generic) 3 x Per Week/30 Days Discharge Instructions: Use supplies as instructed; Kit contains: (15) Saline Bullets; (15) 3x3 Gauze; 15 pr Gloves Cleanser: Soap and Water 3 x Per Week/30 Days Discharge Instructions: Gently cleanse wound with antibacterial soap, rinse and pat dry prior to dressing wounds Prim Dressing: Endoform Natural, Non-fenestrated, 2x2 (in/in) (DME) (Dispense As Written) 3 x Per Week/30 Days ary Secondary Dressing: (BORDER) Zetuvit Plus SILICONE BORDER Dressing 4x4 (in/in) (DME) (Dispense As Written) 3 x Per Week/30 Days Discharge Instructions: Please do not put silicone bordered dressings under wraps. Use non-bordered dressing only. Electronic Signature(s) Signed: 03/29/2022 4:46:02 PM By: Lacey Keeler PA-C Signed: 03/29/2022 5:19:29 PM By: [name] Entered By: [name] on 03/29/2022 11:33:47 -------------------------------------------------------------------------------- Problem List Details Patient Name: Date of Service: [name], [name]. 03/29/2022 10:45 A M Medical Record Number: [id number] Patient Account Number: [id number] Date of Birth/Sex: Treating RN: 1934/05/30 (88 y.o. [name] Primary Care Provider: Denyce [name] Other Clinician: Massie [name] Referring Provider: Treating Provider/Extender: [name] in Treatment: 5 Active Problems ICD-10 Encounter Code Description Active Date MDM Diagnosis L89.893 Pressure ulcer of other site, stage 3 02/22/2022 No Yes [place], [place] ([id number].pdf Page 4 of 6 I50.42 Chronic combined systolic (congestive) and diastolic (congestive) heart failure 02/22/2022 No Yes F01.511 Vascular dementia, unspecified severity, with agitation 02/22/2022 No Yes J44.9 Chronic obstructive pulmonary disease, unspecified 02/22/2022 No Yes Inactive Problems Resolved Problems Electronic Signature(s) Signed: 03/29/2022 10:47:02 AM By: Lacey Keeler PA-C Entered By: [name] on 03/29/2022 10:47:02 -------------------------------------------------------------------------------- Progress Note Details Patient Name: Date of Service: [name], [name]. 03/29/2022 10:45 A M Medical Record Number: [id number] Patient Account Number: [id number] Date of Birth/Sex: Treating RN: 1934-07-17 (88 y.o. [name] Primary Care Provider: Denyce [name] Other Clinician: Massie [name] Referring Provider: Treating Provider/Extender: [name] in Treatment: 5 Subjective Chief Complaint Information obtained from Patient Left foot pressure ulcer History of Present Illness (HPI) 02-22-2022 upon evaluation today patient appears to be doing somewhat poorly in regard to her left medial first metatarsal head where she does have a  bunion. Unfortunately this [name] causing some pressure and has led to a pressure injury at this site. Fortunately I do not see any signs of infection at this point but that [name] something we definitely can need to keep a close eye on. Fortunately there does not appear to be any signs of active infection locally nor systemically at this point. No fevers, chills, nausea, vomiting, or diarrhea. Patient does have a history of of vascular dementia, COPD, congestive heart failure, and the dementia does affect her ability to be able to in some cases follow directions. She [name] very nervous about things in general. This [name] stated to have started somewhere around January 22, 2022. 03-02-2022 upon evaluation today patient appears to be doing well currently in regard to her wounds all things considered. Fortunately I  do not see any signs of infection unfortunately both wounds are still open and the one on the medial foot first metatarsal location [name] still the worst. Although the area over the fifth toe right foot actually [name] also open at this point unfortunately. With that being said I do think we can need to continue to monitor for any signs of worsening or infection although right now I think she [name] doing okay in that regard. 3/4; this [name] a patient with a bunion wound on the left medial foot. She also had a small blister on the right lateral fifth toe. She went for x-rays last week no acute fracture or dislocation was seen on the left side she had worsened moderate hallucis valgus and moderate second and third fourth fifth tarsometatarsal osteoarthritis. We use Prisma. 3/11; patient presents for follow-up. She has been using endoform to the left medial foot wound. She has cushion pads that she uses in between the toes to help offload the areas and prevent future wounds. The right lateral fifth met head remains closed. 03-22-2022 upon evaluation today patient appears to be doing well currently in regard to her wound.  She does show some signs of improvement here. She [name] also showing some signs of being a little bit too moist I think we may want to cut back on the hydrogel possibly just using the saline only at this point she [name] in agreement with that plan. Fortunately I do not see any evidence of active infection locally nor systemically which [name] great news. No fevers, chills, nausea, vomiting, or diarrhea. 03-29-22 upon evaluation today patient actually appears to be making excellent progress and very pleased with how things appear and how the wound [name] doing. I do think that we are moving in the right direction and very pleased in that regard. No fevers, chills, nausea, vomiting, or diarrhea. Objective Constitutional Well-nourished and well-hydrated in no acute distress. [name], [name] ([id number].pdf Page 5 of 6 Vitals Time Taken: 10:56 AM, Height: 62 in, Weight: 105 lbs, BMI: 19.2, Temperature: 97.8 F, Pulse: 83 bpm, Respiratory Rate: 18 breaths/min, Blood Pressure: 134/84 mmHg. Respiratory normal breathing without difficulty. Psychiatric this patient [name] able to make decisions and demonstrates good insight into disease process. Alert and Oriented x 3. pleasant and cooperative. General Notes: Upon inspection patient's wound bed actually showed signs of good granulation epithelization at this point. Overall I am actually very pleased with where we stand I think that she [name] moving in the right direction I did perform debridement to clear away some of the necrotic debris she tolerated that today without complication and postdebridement the wound bed [name] significantly improved which [name] great news. Integumentary (Hair, Skin) Wound #1 status [name] Open. Original cause of wound was Gradually Appeared. The date acquired was: 01/22/2022. The wound has been in treatment 5 [name]. The wound [name] located on the Left,Distal,Medial Foot. The wound measures 0.4cm length x 0.4cm width x 0.1cm  depth; 0.126cm^2 area and 0.013cm^3 volume. There [name] Fat Layer (Subcutaneous Tissue) exposed. There [name] a medium amount of serosanguineous drainage noted. There [name] small (1-33%) pink granulation within the wound bed. There [name] a large (67-100%) amount of necrotic tissue within the wound bed including Adherent Slough. Assessment Active Problems ICD-10 Pressure ulcer of other site, stage 3 Chronic combined systolic (congestive) and diastolic (congestive) heart failure Vascular dementia, unspecified severity, with agitation Chronic obstructive pulmonary disease, unspecified Procedures Wound #1 Pre-procedure diagnosis of Wound #1 [name] a Pressure Ulcer located on  the Left,Distal,Medial Foot . There was a Selective/Open Wound Non-Viable Tissue Debridement with a total area of 1 sq cm performed by [name]., PA-C. With the following instrument(s): Curette to remove Viable and Non-Viable tissue/material. Material removed includes Callus and Slough and. A time out was conducted at 11:27, prior to the start of the procedure. A Minimum amount of bleeding was controlled with Pressure. The procedure was tolerated well. Post Debridement Measurements: 0.4cm length x 0.4cm width x 0.1cm depth; 0.013cm^3 volume. Post debridement Stage noted as Category/Stage III. Character of Wound/Ulcer Post Debridement [name] stable. Post procedure Diagnosis Wound #1: Same as Pre-Procedure Plan Follow-up Appointments: Return Appointment in 1 week. Bathing/ Shower/ Hygiene: Wash wounds with antibacterial soap and water. Anesthetic (Use 'Patient Medications' Section for Anesthetic Order Entry): Lidocaine applied to wound bed Edema Control - Lymphedema / Segmental Compressive Device / Other: Elevate, Exercise Daily and Avoid Standing for Long Periods of Time. Elevate legs to the level of the heart and pump ankles as often as possible Elevate leg(s) parallel to the floor when sitting. WOUND #1: - Foot Wound Laterality: Left,  Medial, Distal Cleanser: Byram Ancillary Kit - 15 Day Supply (DME) (Generic) 3 x Per Week/30 Days Discharge Instructions: Use supplies as instructed; Kit contains: (15) Saline Bullets; (15) 3x3 Gauze; 15 pr Gloves Cleanser: Soap and Water 3 x Per Week/30 Days Discharge Instructions: Gently cleanse wound with antibacterial soap, rinse and pat dry prior to dressing wounds Prim Dressing: Endoform Natural, Non-fenestrated, 2x2 (in/in) (DME) (Dispense As Written) 3 x Per Week/30 Days ary Secondary Dressing: (BORDER) Zetuvit Plus SILICONE BORDER Dressing 4x4 (in/in) (DME) (Dispense As Written) 3 x Per Week/30 Days Discharge Instructions: Please do not put silicone bordered dressings under wraps. Use non-bordered dressing only. 1. I am and I suggest that we have the patient continue to monitor for worsening overall I do think that we are on the right track I am pleased with the dressings and how this [name] approaching the healing of the wound I think were getting closer to complete closure which [name] great news. 2. I am also can recommend currently based on what we are seeing that we have the patient go ahead and continue to monitor for any signs of infection or worsening when changing this at home family develops may be doing the dressings can keep a close eye on this but in general I am actually very pleased with the progress and how this [name] moving. We will see patient back for reevaluation in 1 week here in the clinic. If anything worsens or changes patient will contact our office for additional recommendations. [name], [name] ([id number] Page 6 of 6 Electronic Signature(s) Signed: 03/30/2022 6:45:47 PM By: Lacey Keeler PA-C Entered By: [name] on 03/30/2022 18:45:47 -------------------------------------------------------------------------------- SuperBill Details Patient Name: Date of Service: [place] 03/29/2022 Medical Record Number:  [id number] Patient Account Number: [id number] Date of Birth/Sex: Treating RN: 1934-03-22 (88 y.o. [name] Primary Care Provider: Denyce [name] Other Clinician: Massie [name] Referring Provider: Treating Provider/Extender: [name] in Treatment: 5 Diagnosis Coding ICD-10 Codes Code Description [phone] Pressure ulcer of other site, stage 3 I50.42 Chronic combined systolic (congestive) and diastolic (congestive) heart failure F01.511 Vascular dementia, unspecified severity, with agitation J44.9 Chronic obstructive pulmonary disease, unspecified Facility Procedures : CPT4 Code: [id number] Description: 7597 - DEBRIDE WOUND 1ST 20 SQ CM OR < ICD-10 Diagnosis Description L89.893 Pressure ulcer of other site, stage 3 Modifier: Quantity:  1 Physician Procedures : CPT4 Code Description Modifier [id number] - WC PHYS DEBR WO ANESTH 20 SQ CM ICD-10 Diagnosis Description L89.893 Pressure ulcer of other site, stage 3 Quantity: 1 Electronic Signature(s) Signed: 03/30/2022 6:45:58 PM By: Lacey Keeler PA-C Entered By: [name] on 03/30/2022 18:45:58

## 2022-03-30 NOTE — Progress Notes (Signed)
Lacey, [name] ([id number]) [id number] Page 1 of 7 Visit Report for 02/22/2022 Allergy List Details Patient Name: Date of Service: [name] 02/22/2022 1:30 PM Medical Record Number: [id number] Patient Account Number: [id number] Date of Birth/Sex: Treating RN: 1934-08-28 (88 y.o. [name] Primary Care [name]: Denyce [name] Other Clinician: Referring [name]: Treating [name]/Extender: [name] in Treatment: 0 Allergies Active Allergies Betadine doxycycline monohydrate doxycycline hyclate trimethoprim zinc zinc oxide Dihydroaminopryidine Antibiotics benzoyl peroxide Septra Green Tea (Camellia Sinensis) salicylates NSAIDS (Non-Steroidal Anti-Inflammatory Drug) Pyrazolones tigecycline Sulfa (Sulfonamide Antibiotics) Allergy Notes Electronic Signature(s) Signed: 02/22/2022 2:09:54 PM By: Carlene Coria RN Entered By: Carlene Coria on 02/22/2022 14:09:54 -------------------------------------------------------------------------------- Arrival Information Details Patient Name: Date of Service: Lacey V [name], [name]. 02/22/2022 1:30 PM Medical Record Number: [id number] Patient Account Number: [id number] Date of Birth/Sex: Treating RN: [birth year] (88 y.o. [name] Primary Care [name]: Denyce [name] Other Clinician: Referring Deem Marmol: Treating [name]/Extender: [name] in Treatment: 0 Visit Information Patient Arrived: Wheel Chair Arrival Time: 13:42 Accompanied By: daughter Transfer Assistance: None Patient Identification Verified: Yes Secondary Verification Process Completed: Yes Patient Requires Transmission-Based Precautions: No Patient Has Alerts: No Lacey, [name] ([id number]) [id number] Page 2 of 7 Electronic Signature(s) Signed: 03/30/2022 8:43:41 AM By: Carlene Coria RN Entered By: Carlene Coria on 02/22/2022  13:43:31 -------------------------------------------------------------------------------- Clinic Level of Care Assessment Details Patient Name: Date of Service: [name] 02/22/2022 1:30 PM Medical Record Number: [id number] Patient Account Number: [id number] Date of Birth/Sex: Treating RN: 10/22/1934 (88 y.o. [name] Primary Care [name]: Denyce [name] Other Clinician: Referring [name]: Treating [name]/Extender: [name] in Treatment: 0 Clinic Level of Care Assessment Items TOOL 1 Quantity Score X- 1 0 Use when EandM and Procedure [name] performed on INITIAL visit ASSESSMENTS - Nursing Assessment / Reassessment X- 1 20 General Physical Exam (combine w/ comprehensive assessment (listed just below) when performed on new pt. evals) X- 1 25 Comprehensive Assessment (HX, ROS, Risk Assessments, Wounds Hx, etc.) ASSESSMENTS - Wound and Skin Assessment / Reassessment []  - 0 Dermatologic / Skin Assessment (not related to wound area) ASSESSMENTS - Ostomy and/or Continence Assessment and Care []  - 0 Incontinence Assessment and Management []  - 0 Ostomy Care Assessment and Management (repouching, etc.) PROCESS - Coordination of Care X - Simple Patient / Family Education for ongoing care 1 15 []  - 0 Complex (extensive) Patient / Family Education for ongoing care []  - 0 Staff obtains Programmer, systems, Records, T Results / Process Orders est []  - 0 Staff telephones HHA, Nursing Homes / Clarify orders / etc []  - 0 Routine Transfer to another Facility (non-emergent condition) []  - 0 Routine Hospital Admission (non-emergent condition) []  - 0 New Admissions / Biomedical engineer / Ordering NPWT Apligraf, etc. , []  - 0 Emergency Hospital Admission (emergent condition) PROCESS - Special Needs []  - 0 Pediatric / Minor Patient Management []  - 0 Isolation Patient Management []  - 0 Hearing / Language / Visual special needs []  - 0 Assessment of  Community assistance (transportation, D/C planning, etc.) []  - 0 Additional assistance / Altered mentation []  - 0 Support Surface(s) Assessment (bed, cushion, seat, etc.) INTERVENTIONS - Miscellaneous []  - 0 External ear exam []  - 0 Patient Transfer (multiple staff / Civil Service fast streamer / Similar devices) []  - 0 Simple Staple / Suture removal (25 or less) []  - 0 Complex Staple / Suture removal (26 or more) []  - 0 Hypo/Hyperglycemic Management (do not check  if billed separately) X- 1 15 Ankle / Brachial Index (ABI) - do not check if billed separately Has the patient been seen at the hospital within the last three years: Yes Total Score: 75 Level Of Care: New/Established - Level 2 [name], [name] ([id number]) [id number] Page 3 of 7 Electronic Signature(s) Signed: 03/30/2022 8:43:41 AM By: Carlene Coria RN Entered By: Carlene Coria on 02/22/2022 14:28:23 -------------------------------------------------------------------------------- Encounter Discharge Information Details Patient Name: Date of Service: [name] 02/22/2022 1:30 PM Medical Record Number: [id number] Patient Account Number: [id number] Date of Birth/Sex: Treating RN: 1934/11/22 (88 y.o. [name] Primary Care Kedar Sedano: Denyce [name] Other Clinician: Referring Jeziel Hoffmann: Treating Brayla Pat/Extender: [name] in Treatment: 0 Encounter Discharge Information Items Post Procedure Vitals Discharge Condition: Stable Temperature (F): 98.1 Ambulatory Status: Wheelchair Pulse (bpm): 90 Discharge Destination: Home Respiratory Rate (breaths/min): 16 Transportation: Private Auto Blood Pressure (mmHg): 135/84 Accompanied By: daughter Schedule Follow-up Appointment: Yes Clinical Summary of Care: Electronic Signature(s) Signed: 03/30/2022 8:43:41 AM By: Carlene Coria RN Entered By: Carlene Coria on 02/22/2022  14:30:41 -------------------------------------------------------------------------------- Lower Extremity Assessment Details Patient Name: Date of Service: Lacey V [name], [name]. 02/22/2022 1:30 PM Medical Record Number: [id number] Patient Account Number: [id number] Date of Birth/Sex: Treating RN: 1934/02/23 (88 y.o. [name] Primary Care Draylon Mercadel: Denyce [name] Other Clinician: Referring Domino Holten: Treating Yvonnie Schinke/Extender: [name] in Treatment: 0 Edema Assessment Assessed: [Left: No] [Right: No] Edema: [Left: Ye] [Right: s] Calf Left: Right: Point of Measurement: 32 cm From Medial Instep 27 cm Ankle Left: Right: Point of Measurement: 10 cm From Medial Instep 18 cm Knee To Floor Left: Right: From Medial Instep 42 cm Vascular Assessment Pulses: Dorsalis Pedis Palpable: [Left:Yes] Doppler Audible: [Left:Yes] Blood Pressure: Brachial: [Left:135] Ankle: [Left:Dorsalis Pedis: 110] Ankle Brachial Index: [Left:0.81] Wojnarowski, [name] ([id number]) [Right:[id number] Page 4 of 7] Electronic Signature(s) Signed: 03/30/2022 8:43:41 AM By: Carlene Coria RN Entered By: Carlene Coria on 02/22/2022 13:58:54 -------------------------------------------------------------------------------- Multi-Disciplinary Care Plan Details Patient Name: Date of Service: [place] 02/22/2022 1:30 PM Medical Record Number: [id number] Patient Account Number: [id number] Date of Birth/Sex: Treating RN: 09-04-1934 (88 y.o. [name] Primary Care Marycatherine Maniscalco: Denyce [name] Other Clinician: Referring Marquavius Scaife: Treating Aliyah Abeyta/Extender: [name] in Treatment: 0 Active Inactive Abuse / Safety / Falls / Self Care Management Nursing Diagnoses: Potential for injury related to falls Goals: Patient will remain injury free related to falls Date Initiated: 02/22/2022 Target Resolution Date: 03/23/2022 Goal Status:  Active Interventions: Assess Activities of Daily Living upon admission and as needed Assess fall risk on admission and as needed Assess: immobility, friction, shearing, incontinence upon admission and as needed Assess impairment of mobility on admission and as needed per policy Assess personal safety and home safety (as indicated) on admission and as needed Assess self care needs on admission and as needed Notes: Necrotic Tissue Nursing Diagnoses: Knowledge deficit related to management of necrotic/devitalized tissue Goals: Necrotic/devitalized tissue will be minimized in the wound bed Date Initiated: 02/22/2022 Target Resolution Date: 03/23/2022 Goal Status: Active Interventions: Assess patient pain level pre-, during and post procedure and prior to discharge Provide education on necrotic tissue and debridement process Notes: Wound/Skin Impairment Nursing Diagnoses: Knowledge deficit related to ulceration/compromised skin integrity Goals: Patient/caregiver will verbalize understanding of skin care regimen Date Initiated: 02/22/2022 Target Resolution Date: 03/23/2022 Goal Status: Active Ulcer/skin breakdown will have a volume reduction of 30% by week 4 Date Initiated: 02/22/2022 Target Resolution Date: 03/23/2022  Goal Status: Active Ulcer/skin breakdown will have a volume reduction of 50% by week 8 Date Initiated: 02/22/2022 Target Resolution Date: 04/23/2022 Goal Status: Active Ulcer/skin breakdown will have a volume reduction of 80% by week 12 Date Initiated: 02/22/2022 Target Resolution Date: 05/23/2022 Goal Status: Active Ulcer/skin breakdown will heal within 14 [name] Date Initiated: 02/22/2022 Target Resolution Date: 06/23/2022 [name], [name] ([id number]) [id number] Page 5 of 7 Goal Status: Active Interventions: Assess patient/caregiver ability to obtain necessary supplies Assess patient/caregiver ability to perform ulcer/skin care regimen upon  admission and as needed Assess ulceration(s) every visit Notes: Electronic Signature(s) Signed: 02/22/2022 2:03:25 PM By: Carlene Coria RN Entered By: Carlene Coria on 02/22/2022 14:03:25 -------------------------------------------------------------------------------- Pain Assessment Details Patient Name: Date of Service: Lacey V [name], [name]. 02/22/2022 1:30 PM Medical Record Number: [id number] Patient Account Number: [id number] Date of Birth/Sex: Treating RN: 12-30-1934 (88 y.o. [name] Primary Care Anguel Delapena: Denyce [name] Other Clinician: Referring Barnabas Henriques: Treating Majorie Santee/Extender: [name] in Treatment: 0 Active Problems Location of Pain Severity and Description of Pain Patient Has Paino No Site Locations Pain Management and Medication Current Pain Management: Electronic Signature(s) Signed: 03/30/2022 8:43:41 AM By: Carlene Coria RN Entered By: Carlene Coria on 02/22/2022 13:43:40 -------------------------------------------------------------------------------- Patient/Caregiver Education Details Patient Name: Date of Service: [name], [name] 2/19/2024andnbsp1:30 PM Medical Record Number: [id number] Patient Account Number: [id number] Date of Birth/Gender: Treating RN: 05-Sep-1934 (88 y.o. [name] Primary Care Physician: Denyce [name] Other Clinician: Referring Physician: Treating Physician/Extender: Clois Comber in Treatment: 0 Education Assessment Education Provided To: Patient [name], [name] ([id number]) [id number] Page 6 of 7 Education Topics Provided Welcome [facility] Packet: o Methods: Explain/Verbal Responses: State content correctly Electronic Signature(s) Signed: 03/30/2022 8:43:41 AM By: Carlene Coria RN Entered By: Carlene Coria on 02/22/2022 14:03:42 -------------------------------------------------------------------------------- Wound  Assessment Details Patient Name: Date of Service: Lacey V [name], [name]. 02/22/2022 1:30 PM Medical Record Number: [id number] Patient Account Number: [id number] Date of Birth/Sex: Treating RN: [birth year] (88 y.o. [name] Primary Care Vahe Pienta: Denyce [name] Other Clinician: Referring Anamae Rochelle: Treating Mellissa Conley/Extender: [name] in Treatment: 0 Wound Status Wound Number: 1 Primary Pressure Ulcer Etiology: Wound Location: Distal, Medial Foot Wound Open Wounding Event: Gradually Appeared Status: Date Acquired: 01/22/2022 Comorbid Chronic Obstructive Pulmonary Disease (COPD), Congestive [name] Of Treatment: 0 History: Heart Failure, Dementia Clustered Wound: No Photos Wound Measurements Length: (cm) 0.3 Width: (cm) 0.3 Depth: (cm) 0.2 Area: (cm) 0.071 Volume: (cm) 0.014 % Reduction in Area: % Reduction in Volume: Epithelialization: None Tunneling: No Undermining: Yes Starting Position (o'clock): 12 Ending Position (o'clock): 12 Maximum Distance: (cm) 0.3 Wound Description Classification: Category/Stage III Exudate Amount: Medium Exudate Type: Serosanguineous Exudate Color: red, brown Foul Odor After Cleansing: No Slough/Fibrino Yes Wound Bed Granulation Amount: Small (1-33%) Exposed Structure Granulation Quality: Pink Fascia Exposed: No Necrotic Amount: Large (67-100%) Fat Layer (Subcutaneous Tissue) Exposed: Yes Necrotic Quality: Adherent Slough Tendon Exposed: No Muscle Exposed: No Joint Exposed: No Bone Exposed: No Electronic Signature(s) Pulis, [name] ([id number]) [id number] Page 7 of 7 Signed: 03/30/2022 8:43:41 AM By: Carlene Coria RN Entered By: Carlene Coria on 02/22/2022 13:59:15 -------------------------------------------------------------------------------- Vitals Details Patient Name: Date of Service: Lacey V [name], [name]. 02/22/2022 1:30 PM Medical Record Number: [id number] Patient Account  Number: [id number] Date of Birth/Sex: Treating RN: 1934-09-09 (88 y.o. [name] Primary Care Ayvion Kavanagh: Denyce [name] Other Clinician: Referring Cesar Rogerson: Treating Hera Celaya/Extender: [name] in Treatment:  0 Vital Signs Time Taken: 13:43 Temperature (F): 98.1 Height (in): 62 Pulse (bpm): 90 Source: Stated Respiratory Rate (breaths/min): 16 Weight (lbs): 105 Blood Pressure (mmHg): 135/64 Source: Stated Reference Range: 80 - 120 mg / dl Body Mass Index (BMI): 19.2 Electronic Signature(s) Signed: 03/30/2022 8:43:41 AM By: Carlene Coria RN Entered By: Carlene Coria on 02/22/2022 13:44:20

## 2022-03-30 NOTE — Progress Notes (Signed)
[name], CREGAR ([id number]) [id number] Page 1 of 4 Visit Report for 02/22/2022 Abuse Risk Screen Details Patient Name: Date of Service: [name] 02/22/2022 1:30 PM Medical Record Number: [id number] Patient Account Number: [id number] Date of Birth/Sex: Treating RN: [birth year] (88 y.o. [name] Primary Care [name]: Denyce [name] Other Clinician: Referring [name]: Treating [name]/Extender: [name] in Treatment: 0 Abuse Risk Screen Items Answer ABUSE RISK SCREEN: Has anyone close to you tried to hurt or harm you recentlyo No Do you feel uncomfortable with anyone in your familyo No Has anyone forced you do things that you didnt want to doo No Electronic Signature(s) Signed: 03/30/2022 8:43:41 AM By: Lacey Coria RN Entered By: [name] on 02/22/2022 13:46:48 -------------------------------------------------------------------------------- Activities of Daily Living Details Patient Name: Date of Service: Lacey [name] IS, Aastha [name]. 02/22/2022 1:30 PM Medical Record Number: [id number] Patient Account Number: [id number] Date of Birth/Sex: Treating RN: 1934-02-04 (88 y.o. [name] Primary Care Kaidynce Pfister: Denyce [name] Other Clinician: Referring Natalye Kott: Treating Rhyan Wolters/Extender: [name] in Treatment: 0 Activities of Daily Living Items Answer Activities of Daily Living (Please select one for each item) Drive Automobile Not Able T Medications ake Not Able Use T elephone Not Able Care for Appearance Not Able Use T oilet Need Assistance Bath / Shower Not Able Dress Self Need Assistance Feed Self Need Assistance Walk Not Able Get In / Out Bed Need Assistance Housework Not Able Prepare Meals Not [place] Not Able Shop for Self Not Able Electronic Signature(s) Signed: 03/30/2022 8:43:41 AM By: Lacey Coria RN Entered By: [name] on 02/22/2022  13:47:52 -------------------------------------------------------------------------------- Education Screening Details Patient Name: Date of Service: Lacey V IS, Mary-Anne [name]. 02/22/2022 1:30 PM Medical Record Number: [id number] Patient Account Number: [id number] Date of Birth/Sex: Treating RN: 15-Jul-1934 (88 y.o. [name] Primary Care Theona Muhs: Denyce [name] Other Clinician: Referring [name]: Treating Marieli Rudy/Extender: [name] in Treatment: 0 [name], [name] ([id number]) [id number] Page 2 of 4 Primary Learner Assessed: Caregiver Learning Preferences/Education Level/Primary Language Learning Preference: Explanation Highest Education Level: High School Preferred Language: Diplomatic Services operational officer Language Barrier: No Translator Needed: No Memory Deficit: No Emotional Barrier: No Cultural/Religious Beliefs Affecting Medical Care: No Physical Barrier Impaired Vision: No Impaired Hearing: No Decreased Hand dexterity: No Knowledge/Comprehension Knowledge Level: Medium Comprehension Level: Medium Ability to understand written instructions: Medium Ability to understand verbal instructions: Medium Motivation Anxiety Level: Anxious Cooperation: Cooperative Education Importance: Acknowledges Need Interest in Health Problems: Asks Questions Perception: Coherent Willingness to Engage in Self-Management Medium Activities: Readiness to Engage in Self-Management Medium Activities: Electronic Signature(s) Signed: 03/30/2022 8:43:41 AM By: Lacey Coria RN Entered By: [name] on 02/22/2022 13:48:20 -------------------------------------------------------------------------------- Fall Risk Assessment Details Patient Name: Date of Service: Lacey V IS, Ryiah [name]. 02/22/2022 1:30 PM Medical Record Number: [id number] Patient Account Number: [id number] Date of Birth/Sex: Treating RN: Jun 03, 1934 (88 y.o. [name] Primary  Care [name]: Denyce [name] Other Clinician: Referring Jaquelin Meaney: Treating Keilany Burnette/Extender: [name] in Treatment: 0 Fall Risk Assessment Items Have you had 2 or more falls in the last 12 monthso 0 Yes Have you had any fall that resulted in injury in the last 12 monthso 0 Yes FALLS RISK SCREEN History of falling - immediate or within 3 months 0 No Secondary diagnosis (Do you have 2 or more medical diagnoseso) 0 No Ambulatory aid None/bed rest/wheelchair/nurse 0 Yes Crutches/cane/walker 0 No Furniture 0 No Intravenous therapy Access/Saline/Heparin  Lock 0 No Gait/Transferring Normal/ bed rest/ wheelchair 0 Yes Weak (short steps with or without shuffle, stooped but able to lift head while walking, may seek 0 No support from furniture) Impaired (short steps with shuffle, may have difficulty arising from chair, head down, impaired 0 No balance) Mental Status Oriented to own ability 0 Yes Overestimates or forgets limitations 0 No Risk Level: Low Risk Score: 0 [name] ([id number]) [id number] Page 3 of 4 Electronic Signature(s) -------------------------------------------------------------------------------- Foot Assessment Details Patient Name: Date of Service: Lacey V IS, Lina [name]. 02/22/2022 1:30 PM Medical Record Number: [id number] Patient Account Number: [id number] Date of Birth/Sex: Treating RN: [birth year] (88 y.o. [name] Primary Care [name]: Denyce [name] Other Clinician: Referring [name]: Treating [name]/Extender: [name] in Treatment: 0 Foot Assessment Items [x]  Unable to perform due to altered mental status Site Locations + = Sensation present, - = Sensation absent, C = Callus, U = Ulcer R = Redness, W = Warmth, M = Maceration, PU = Pre-ulcerative lesion F = Fissure, S = Swelling, Lacey = Dryness Assessment Right: Left: Other Deformity: No No Prior Foot Ulcer: No  No Prior Amputation: No No Charcot Joint: No No Ambulatory Status: Ambulatory With Help Assistance Device: Wheelchair Gait: Buyer, retail Signature(s) Signed: 03/30/2022 8:43:41 AM By: Lacey Coria RN Entered By: [name] on 02/22/2022 13:49:18 -------------------------------------------------------------------------------- Nutrition Risk Screening Details Patient Name: Date of Service: Lacey [name] IS, Naria [name]. 02/22/2022 1:30 PM Medical Record Number: [id number] Patient Account Number: [id number] Date of Birth/Sex: Treating RN: [birth year] (89 y.o. [name] Primary Care Schylar Wuebker: Denyce [name] Other Clinician: Referring Kasson Lamere: Treating Kayleigh Broadwell/Extender: [name] in Treatment: 0 Height (in): 62 Weight (lbs): 105 Body Mass Index (BMI): 19.2 Mestas, Sloka [name] ([id number]) [id number] Page 4 of 4 Nutrition Risk Screening Items Score Screening NUTRITION RISK SCREEN: I have an illness or condition that made me change the kind and/or amount of food I eat 2 Yes I eat fewer than two meals per day 0 No I eat few fruits and vegetables, or milk products 0 No I have three or more drinks of beer, liquor or wine almost every day 0 No I have tooth or mouth problems that make it hard for me to eat 0 No I don't always have enough money to buy the food I need 0 No I eat alone most of the time 0 No I take three or more different prescribed or over-the-counter drugs a day 1 Yes Without wanting to, I have lost or gained 10 pounds in the last six months 0 No I am not always physically able to shop, cook and/or feed myself 2 Yes Nutrition Protocols Good Risk Protocol Moderate Risk Protocol 0 Provide education on nutrition High Risk Proctocol Risk Level: Moderate Risk Score: 5 Electronic Signature(s) Signed: 03/30/2022 8:43:41 AM By: Lacey Coria RN Entered By: [name] on 02/22/2022 13:48:59

## 2022-03-30 NOTE — Progress Notes (Signed)
ULAH, NUMAN ([id number]) [id number] Page 1 of 7 Visit Report for 02/22/2022 Chief Complaint Document Details Patient Name: Date of Service: [name] 02/22/2022 1:30 PM Medical Record Number: [id number] Patient Account Number: [id number] Date of Birth/Sex: Treating RN: [birth year] (88 y.o. [name] Primary Care Provider: Denyce [name] Other Clinician: Referring Provider: Treating Provider/Extender: [name] in Treatment: 0 Information Obtained from: Patient Chief Complaint Left foot pressure ulcer Electronic Signature(s) Signed: 02/22/2022 2:14:54 PM By: Worthy Keeler PA-C Entered By: Worthy Keeler on 02/22/2022 14:14:54 -------------------------------------------------------------------------------- Debridement Details Patient Name: Date of Service: [name], [name]. 02/22/2022 1:30 PM Medical Record Number: [id number] Patient Account Number: [id number] Date of Birth/Sex: Treating RN: 04-Mar-1934 (88 y.o. [name] Primary Care Provider: Denyce [name] Other Clinician: Referring Provider: Treating Provider/Extender: [name] in Treatment: 0 Debridement Performed for Assessment: Wound #1 Distal,Medial Foot Performed By: Physician [name]., PA-C Debridement Type: Debridement Level of Consciousness (Pre-procedure): Awake and Alert Pre-procedure Verification/Time Out Yes - 14:20 Taken: Start Time: 14:20 T Area Debrided ([name] x W): otal 0.5 (cm) x 0.5 (cm) = 0.25 (cm) Tissue and other material debrided: Viable, Non-Viable, Slough, Subcutaneous, Skin: Dermis , Skin: Epidermis, Slough Level: Skin/Subcutaneous Tissue Debridement Description: Excisional Instrument: Curette Bleeding: Moderate Hemostasis Achieved: Pressure End Time: 14:25 Procedural Pain: 0 Post Procedural Pain: 0 Response to Treatment: Procedure was tolerated well Level of Consciousness (Post- Awake and  Alert procedure): Post Debridement Measurements of Total Wound Length: (cm) 0.4 Stage: Category/Stage III Width: (cm) 0.4 Depth: (cm) 0.5 Volume: (cm) 0.063 Character of Wound/Ulcer Post Debridement: Improved Post Procedure Diagnosis Same as Pre-procedure Electronic Signature(s) Signed: 02/22/2022 4:31:31 PM By: Worthy Keeler PA-C Signed: 03/30/2022 8:43:41 AM By: Lacey Coria RN Entered By: [name] on 02/22/2022 14:25:39 [name], [name] ([id number]) [id number] Page 2 of 7 -------------------------------------------------------------------------------- HPI Details Patient Name: Date of Service: [name] 02/22/2022 1:30 PM Medical Record Number: [id number] Patient Account Number: [id number] Date of Birth/Sex: Treating RN: Feb 05, 1934 (88 y.o. [name] Primary Care Provider: Denyce [name] Other Clinician: Referring Provider: Treating Provider/Extender: [name] in Treatment: 0 History of Present Illness HPI Description: 02-22-2022 upon evaluation today patient appears to be doing somewhat poorly in regard to her left medial first metatarsal head where she does have a bunion. Unfortunately this [name] causing some pressure and has led to a pressure injury at this site. Fortunately I do not see any signs of infection at this point but that [name] something we definitely can need to keep a close eye on. Fortunately there does not appear to be any signs of active infection locally nor systemically at this point. No fevers, chills, nausea, vomiting, or diarrhea. Patient does have a history of of vascular dementia, COPD, congestive heart failure, and the dementia does affect her ability to be able to in some cases follow directions. She [name] very nervous about things in general. This [name] stated to have started somewhere around January 22, 2022. Electronic Signature(s) Signed: 02/22/2022 2:31:56 PM By: Worthy Keeler  PA-C Entered By: Worthy Keeler on 02/22/2022 14:31:56 -------------------------------------------------------------------------------- Physical Exam Details Patient Name: Date of Service: [name], [name]. 02/22/2022 1:30 PM Medical Record Number: [id number] Patient Account Number: [id number] Date of Birth/Sex: Treating RN: Jun 20, 1934 (88 y.o. [name] Primary Care Provider: Denyce [name] Other Clinician: Referring Provider: Treating Provider/Extender: [name] in Treatment: 0 Constitutional  Well-nourished and well-hydrated in no acute distress. Eyes conjunctiva clear no eyelid edema noted. pupils equal round and reactive to light and accommodation. Ears, Nose, Mouth, and Throat no gross abnormality of ear auricles or external auditory canals. normal hearing noted during conversation. mucus membranes moist. Respiratory normal breathing without difficulty. Cardiovascular 1+ dorsalis pedis/posterior tibialis pulses. no clubbing, cyanosis, significant edema, <3 sec cap refill. Musculoskeletal Patient unable to walk without assistance. Psychiatric this patient [name] able to make decisions and demonstrates good insight into disease process. Alert and Oriented x 3. pleasant and cooperative. Notes Upon inspection this does appear to be a pressure injury over the medial portion of the metatarsal head first location. Subsequently she does seem to have some need for sharp debridement today and I discussed with her that this will cause possibly a little bit of bleeding if we go forward with this. She voiced understanding. I did perform debridement clearway some of the necrotic debris she tolerated that today without complication and postdebridement the wound bed appears to be doing better. She seems to have pretty good blood flow her ABI was 0.81 and again overall I am pleased with how things appear she [name] overall doing well in my opinion although this does  appear to be kind of deep for the location that it [name] I definitely think [name] a grade 3 I am not certain it could be a grade 4 we will get have to see how this follows out. Electronic Signature(s) Signed: 02/22/2022 2:32:54 PM By: Worthy Keeler PA-C Entered By: Worthy Keeler on 02/22/2022 14:32:53 -------------------------------------------------------------------------------- Physician Orders Details Patient Name: Date of Service: [place], [place] 02/22/2022 1:30 PM Waggaman, Anne Hahn ([id number]) [id number] Page 3 of 7 Medical Record Number: [id number] Patient Account Number: [id number] Date of Birth/Sex: Treating RN: Apr 20, 1934 (88 y.o. [name] Primary Care Provider: Denyce [name] Other Clinician: Referring Provider: Treating Provider/Extender: [name] in Treatment: 0 Verbal / Phone Orders: No Diagnosis Coding ICD-10 Coding Code Description [phone] Pressure ulcer of other site, stage 3 I50.42 Chronic combined systolic (congestive) and diastolic (congestive) heart failure F01.511 Vascular dementia, unspecified severity, with agitation J44.9 Chronic obstructive pulmonary disease, unspecified Follow-up Appointments Return Appointment in 1 week. Bathing/ [name][employer] wounds with antibacterial soap and water. Anesthetic (Use 'Patient Medications' Section for Anesthetic Order Entry) Lidocaine applied to wound bed Edema Control - Lymphedema / Segmental Compressive Device / Other Elevate, Exercise Daily and A void Standing for Long Periods of Time. Elevate legs to the level of the heart and pump ankles as often as possible Elevate leg(s) parallel to the floor when sitting. Wound Treatment Wound #1 - Foot Wound Laterality: Medial, Distal Cleanser: Byram Ancillary Kit - 15 Day Supply (DME) (Generic) 3 x Per Week/30 Days Discharge Instructions: Use supplies as instructed; Kit contains: (15) Saline Bullets; (15) 3x3  Gauze; 15 pr Gloves Cleanser: Soap and Water 3 x Per Week/30 Days Discharge Instructions: Gently cleanse wound with antibacterial soap, rinse and pat dry prior to dressing wounds Prim Dressing: Promogran Matrix 4.34 (in) 3 x Per Week/30 Days ary Discharge Instructions: Moisten w/normal saline or sterile water; Cover wound as directed. Do not remove from wound bed. Secondary Dressing: (BORDER) Zetuvit Plus SILICONE BORDER Dressing 4x4 (in/in) (DME) (Generic) 3 x Per Week/30 Days Discharge Instructions: Please do not put silicone bordered dressings under wraps. Use non-bordered dressing only. Electronic Signature(s) Signed: 02/22/2022 4:31:31 PM By: Worthy Keeler PA-C Signed: 03/30/2022 8:43:41 AM By: Dolores Lory  Morey Hummingbird RN Entered By: [name] on 02/22/2022 14:28:01 -------------------------------------------------------------------------------- Problem List Details Patient Name: Date of Service: [name], [name]. 02/22/2022 1:30 PM Medical Record Number: [id number] Patient Account Number: [id number] Date of Birth/Sex: Treating RN: 1934/04/02 (88 y.o. [name] Primary Care Provider: Denyce [name] Other Clinician: Referring Provider: Treating Provider/Extender: [name] in Treatment: 0 Active Problems ICD-10 Encounter Code Description Active Date MDM Diagnosis L89.893 Pressure ulcer of other site, stage 3 02/22/2022 No Yes I50.42 Chronic combined systolic (congestive) and diastolic (congestive) heart failure 02/22/2022 No Yes [place], [place] ([id number]) [id number] Page 4 of 7 F01.511 Vascular dementia, unspecified severity, with agitation 02/22/2022 No Yes J44.9 Chronic obstructive pulmonary disease, unspecified 02/22/2022 No Yes Inactive Problems Resolved Problems Electronic Signature(s) Signed: 02/22/2022 2:14:21 PM By: Worthy Keeler PA-C Entered By: Worthy Keeler on 02/22/2022  14:14:21 -------------------------------------------------------------------------------- Progress Note Details Patient Name: Date of Service: [name], [name]. 02/22/2022 1:30 PM Medical Record Number: [id number] Patient Account Number: [id number] Date of Birth/Sex: Treating RN: Jun 19, 1934 (88 y.o. [name] Primary Care Provider: Denyce [name] Other Clinician: Referring Provider: Treating Provider/Extender: [name] in Treatment: 0 Subjective Chief Complaint Information obtained from Patient Left foot pressure ulcer History of Present Illness (HPI) 02-22-2022 upon evaluation today patient appears to be doing somewhat poorly in regard to her left medial first metatarsal head where she does have a bunion. Unfortunately this [name] causing some pressure and has led to a pressure injury at this site. Fortunately I do not see any signs of infection at this point but that [name] something we definitely can need to keep a close eye on. Fortunately there does not appear to be any signs of active infection locally nor systemically at this point. No fevers, chills, nausea, vomiting, or diarrhea. Patient does have a history of of vascular dementia, COPD, congestive heart failure, and the dementia does affect her ability to be able to in some cases follow directions. She [name] very nervous about things in general. This [name] stated to have started somewhere around January 22, 2022. Patient History Allergies Betadine, doxycycline monohydrate, doxycycline hyclate, trimethoprim, zinc, zinc oxide, Dihydroaminopryidine Antibiotics, benzoyl peroxide, Septra, Green T (Camellia Sinensis), salicylates, NSAIDS (Non-Steroidal Anti-Inflammatory Drug), Pyrazolones, tigecycline, Sulfa (Sulfonamide Antibiotics) ea Social History Current every day smoker, Marital Status - Widowed, Alcohol Use - Never, Drug Use - No History, Caffeine Use - Rarely. Medical History Respiratory Patient has  history of Chronic Obstructive Pulmonary Disease (COPD) Cardiovascular Patient has history of Congestive Heart Failure Neurologic Patient has history of Dementia Review of Systems (ROS) Integumentary (Skin) Complains or has symptoms of Wounds. Objective Constitutional Well-nourished and well-hydrated in no acute distress. [name], [name] ([id number]) [id number] Page 5 of 7 Vitals Time Taken: 1:43 PM, Height: 62 in, Source: Stated, Weight: 105 lbs, Source: Stated, BMI: 19.2, Temperature: 98.1 F, Pulse: 90 bpm, Respiratory Rate: 16 breaths/min, Blood Pressure: 135/64 mmHg. Eyes conjunctiva clear no eyelid edema noted. pupils equal round and reactive to light and accommodation. Ears, Nose, Mouth, and Throat no gross abnormality of ear auricles or external auditory canals. normal hearing noted during conversation. mucus membranes moist. Respiratory normal breathing without difficulty. Cardiovascular 1+ dorsalis pedis/posterior tibialis pulses. no clubbing, cyanosis, significant edema, Musculoskeletal Patient unable to walk without assistance. Psychiatric this patient [name] able to make decisions and demonstrates good insight into disease process. Alert and Oriented x 3. pleasant and cooperative. General Notes: Upon inspection this does appear to be a  pressure injury over the medial portion of the metatarsal head first location. Subsequently she does seem to have some need for sharp debridement today and I discussed with her that this will cause possibly a little bit of bleeding if we go forward with this. She voiced understanding. I did perform debridement clearway some of the necrotic debris she tolerated that today without complication and postdebridement the wound bed appears to be doing better. She seems to have pretty good blood flow her ABI was 0.81 and again overall I am pleased with how things appear she [name] overall doing well in my opinion although this  does appear to be kind of deep for the location that it [name] I definitely think [name] a grade 3 I am not certain it could be a grade 4 we will get have to see how this follows out. Integumentary (Hair, Skin) Wound #1 status [name] Open. Original cause of wound was Gradually Appeared. The date acquired was: 01/22/2022. The wound [name] located on the [place]. The wound measures 0.3cm length x 0.3cm width x 0.2cm depth; 0.071cm^2 area and 0.014cm^3 volume. There [name] Fat Layer (Subcutaneous Tissue) exposed. There [name] no tunneling noted, however, there [name] undermining starting at 12:00 and ending at 12:00 with a maximum distance of 0.3cm. There [name] a medium amount of serosanguineous drainage noted. There [name] small (1-33%) pink granulation within the wound bed. There [name] a large (67-100%) amount of necrotic tissue within the wound bed including Adherent Slough. Assessment Active Problems ICD-10 Pressure ulcer of other site, stage 3 Chronic combined systolic (congestive) and diastolic (congestive) heart failure Vascular dementia, unspecified severity, with agitation Chronic obstructive pulmonary disease, unspecified Procedures Wound #1 Pre-procedure diagnosis of Wound #1 [name] a Pressure Ulcer located on the Distal,Medial Foot . There was a Excisional Skin/Subcutaneous Tissue Debridement with a total area of 0.25 sq cm performed by [name]., PA-C. With the following instrument(s): Curette to remove Viable and Non-Viable tissue/material. Material removed includes Subcutaneous Tissue, Slough, Skin: Dermis, and Skin: Epidermis. No specimens were taken. A time out was conducted at 14:20, prior to the start of the procedure. A Moderate amount of bleeding was controlled with Pressure. The procedure was tolerated well with a pain level of 0 throughout and a pain level of 0 following the procedure. Post Debridement Measurements: 0.4cm length x 0.4cm width x 0.5cm depth; 0.063cm^3 volume. Post debridement Stage  noted as Category/Stage III. Character of Wound/Ulcer Post Debridement [name] improved. Post procedure Diagnosis Wound #1: Same as Pre-Procedure Plan Follow-up Appointments: Return Appointment in 1 week. Bathing/ Shower/ Hygiene: Wash wounds with antibacterial soap and water. Anesthetic (Use 'Patient Medications' Section for Anesthetic Order Entry): Lidocaine applied to wound bed Edema Control - Lymphedema / Segmental Compressive Device / Other: Elevate, Exercise Daily and Avoid Standing for Long Periods of Time. Elevate legs to the level of the heart and pump ankles as often as possible Elevate leg(s) parallel to the floor when sitting. WOUND #1: - Foot Wound Laterality: Medial, Distal Cleanser: Byram Ancillary Kit - 15 Day Supply (DME) (Generic) 3 x Per Week/30 Days Discharge Instructions: Use supplies as instructed; Kit contains: (15) Saline Bullets; (15) 3x3 Gauze; 15 pr Gloves Cleanser: Soap and Water 3 x Per Week/30 Days Discharge Instructions: Gently cleanse wound with antibacterial soap, rinse and pat dry prior to dressing wounds [name], [name] ([id number]) [id number] Page 6 of 7 Prim Dressing: Promogran Matrix 4.34 (in) 3 x Per Week/30 Days ary Discharge Instructions: Moisten w/normal saline or sterile  water; Cover wound as directed. Do not remove from wound bed. Secondary Dressing: (BORDER) Zetuvit Plus SILICONE BORDER Dressing 4x4 (in/in) (DME) (Generic) 3 x Per Week/30 Days Discharge Instructions: Please do not put silicone bordered dressings under wraps. Use non-bordered dressing only. 1. I would recommend that we have the patient going continue to monitor for any signs of infection or worsening. If anything occurs they should let me know as soon as possible no antibiotics indicated at the moment. 2. Also can recommend we switch to a collagen based dressing followed by Zetuvit bordered foam dressing for padding. 3. She should change it 3 times per week  and we will see where things stand at follow-up. We will see patient back for reevaluation in 1 week here in the clinic. If anything worsens or changes patient will contact our office for additional recommendations. Electronic Signature(s) Signed: 02/22/2022 2:33:22 PM By: Worthy Keeler PA-C Entered By: Worthy Keeler on 02/22/2022 14:33:22 -------------------------------------------------------------------------------- ROS/PFSH Details Patient Name: Date of Service: [name], [place] [name]. 02/22/2022 1:30 PM Medical Record Number: [id number] Patient Account Number: [id number] Date of Birth/Sex: Treating RN: 1934-01-24 (88 y.o. [name] Primary Care Provider: Denyce [name] Other Clinician: Referring Provider: Treating Provider/Extender: [name] in Treatment: 0 Integumentary (Skin) Complaints and Symptoms: Positive for: Wounds Respiratory Medical History: Positive for: Chronic Obstructive Pulmonary Disease (COPD) Cardiovascular Medical History: Positive for: Congestive Heart Failure Neurologic Medical History: Positive for: Dementia Immunizations Pneumococcal Vaccine: Received Pneumococcal Vaccination: No Implantable Devices None Family and Social History Current every day smoker; Marital Status - Widowed; Alcohol Use: Never; Drug Use: No History; Caffeine Use: Rarely Electronic Signature(s) Signed: 02/22/2022 1:48:29 PM By: Worthy Keeler PA-C Signed: 03/30/2022 8:43:41 AM By: Lacey Coria RN Entered By: [name] on 02/22/2022 13:46:40 -------------------------------------------------------------------------------- SuperBill Details Patient Name: Date of Service: [place]. 02/22/2022 Medical Record Number: [id number] Patient Account Number: [id number] Date of Birth/Sex: Treating RN: 02-14-1934 (88 y.o. [name], [name], [name] ([id number]) [id number] Page 7 of 7 Primary Care Provider: Denyce [name] Other Clinician: Referring Provider: Treating Provider/Extender: [name] in Treatment: 0 Diagnosis Coding ICD-10 Codes Code Description [phone] Pressure ulcer of other site, stage 3 I50.42 Chronic combined systolic (congestive) and diastolic (congestive) heart failure F01.511 Vascular dementia, unspecified severity, with agitation J44.9 Chronic obstructive pulmonary disease, unspecified Facility Procedures : CPT4 Code: [id number] Description: [phone] - WOUND CARE VISIT-LEV 2 EST PT Modifier: Quantity: 1 : CPT4 Code: [id number] Description: [id number] - DEB SUBQ TISSUE 20 SQ CM/< ICD-10 Diagnosis Description L89.893 Pressure ulcer of other site, stage 3 Modifier: Quantity: 1 Physician Procedures : CPT4 Code Description Modifier [id number] WC PHYS LEVEL 3 NEW PT 25 ICD-10 Diagnosis Description L89.893 Pressure ulcer of other site, stage 3 I50.42 Chronic combined systolic (congestive) and diastolic (congestive) heart failure F01.511 Vascular  dementia, unspecified severity, with agitation J44.9 Chronic obstructive pulmonary disease, unspecified Quantity: 1 : [id number] - WC PHYS SUBQ TISS 20 SQ CM ICD-10 Diagnosis Description L89.893 Pressure ulcer of other site, stage 3 Quantity: 1 Electronic Signature(s) Signed: 02/22/2022 2:34:11 PM By: Worthy Keeler PA-C Entered By: Worthy Keeler on 02/22/2022 14:34:11

## 2022-04-05 ENCOUNTER — Encounter: Payer: Medicare Other | Attending: Physician Assistant | Admitting: Physician Assistant

## 2022-04-05 DIAGNOSIS — F0150 Vascular dementia without behavioral disturbance: Secondary | ICD-10-CM | POA: Insufficient documentation

## 2022-04-05 DIAGNOSIS — I5042 Chronic combined systolic (congestive) and diastolic (congestive) heart failure: Secondary | ICD-10-CM | POA: Diagnosis not present

## 2022-04-05 DIAGNOSIS — L89893 Pressure ulcer of other site, stage 3: Secondary | ICD-10-CM | POA: Diagnosis present

## 2022-04-05 DIAGNOSIS — J449 Chronic obstructive pulmonary disease, unspecified: Secondary | ICD-10-CM | POA: Insufficient documentation

## 2022-04-05 NOTE — Progress Notes (Addendum)
[name] ([id number] Page 1 of 5 Visit Report for 04/05/2022 Chief Complaint Document Details Patient Name: Date of Service: [name], [name] 04/05/2022 2:30 PM Medical Record Number: [id number] Patient Account Number: [id number] Date of Birth/Sex: Treating RN: 01-02-1935 (88 y.o. [name] Primary Care Provider: Denyce Robert Other Clinician: Referring Provider: Treating Provider/Extender: Norwood Levo Weeks in Treatment: 6 Information Obtained from: Patient Chief Complaint Left foot pressure ulcer Electronic Signature(s) Signed: 04/05/2022 2:45:16 PM By: Worthy Keeler PA-C Entered By: Worthy Keeler on 04/05/2022 14:45:15 -------------------------------------------------------------------------------- HPI Details Patient Name: Date of Service: [name], [name] 04/05/2022 2:30 PM Medical Record Number: [id number] Patient Account Number: [id number] Date of Birth/Sex: Treating RN: Mar 30, 1934 (88 y.o. [name] Primary Care Provider: Denyce Robert Other Clinician: Referring Provider: Treating Provider/Extender: Norwood Levo Weeks in Treatment: 6 History of Present Illness HPI Description: 02-22-2022 upon evaluation today patient appears to be doing somewhat poorly in regard to her left medial first metatarsal head where she does have a bunion. Unfortunately this [name] causing some pressure and has led to a pressure injury at this site. Fortunately I do not see any signs of infection at this point but that [name] something we definitely can need to keep a close eye on. Fortunately there does not appear to be any signs of active infection locally nor systemically at this point. No fevers, chills, nausea, vomiting, or diarrhea. Patient does have a history of of vascular dementia, COPD, congestive heart failure, and the dementia does affect her ability to be able to in some cases follow directions. She [name]  very nervous about things in general. This [name] stated to have started somewhere around January 22, 2022. 03-02-2022 upon evaluation today patient appears to be doing well currently in regard to her wounds all things considered. Fortunately I do not see any signs of infection unfortunately both wounds are still open and the one on the medial foot first metatarsal location [name] still the worst. Although the area over the fifth toe right foot actually [name] also open at this point unfortunately. With that being said I do think we can need to continue to monitor for any signs of worsening or infection although right now I think she [name] doing okay in that regard. 3/4; this [name] a patient with a bunion wound on the left medial foot. She also had a small blister on the right lateral fifth toe. She went for x-rays last week no acute fracture or dislocation was seen on the left side she had worsened moderate hallucis valgus and moderate second and third fourth fifth tarsometatarsal osteoarthritis. We use Prisma. 3/11; patient presents for follow-up. She has been using endoform to the left medial foot wound. She has cushion pads that she uses in between the toes to help offload the areas and prevent future wounds. The right lateral fifth met head remains closed. 03-22-2022 upon evaluation today patient appears to be doing well currently in regard to her wound. She does show some signs of improvement here. She [name] also showing some signs of being a little bit too moist I think we may want to cut back on the hydrogel possibly just using the saline only at this point she [name] in agreement with that plan. Fortunately I do not see any evidence of active infection locally nor systemically which [name] great news. No fevers, chills, nausea, vomiting, or diarrhea. 03-29-22 upon evaluation today patient actually appears to be making  excellent progress and very pleased with how things appear and how the wound [name] doing. I do think that we  are moving in the right direction and very pleased in that regard. No fevers, chills, nausea, vomiting, or diarrhea. 04-05-2022 upon evaluation today patient appears to be doing well currently in regard to her wound. This [name] actually showing signs of improvement. Fortunately there does not appear to be any signs of active infection locally nor systemically at this time which [name] great news. No fevers, chills, nausea, vomiting, or diarrhea. Electronic Signature(s) Signed: 04/05/2022 3:16:37 PM By: Lenda Kelp PA-C Entered By: Lenda Kelp on 04/05/2022 15:16:37 [name], [name] ([id number]) [id number] Page 2 of 5 -------------------------------------------------------------------------------- Physical Exam Details Patient Name: Date of Service: [name], [name] 04/05/2022 2:30 PM Medical Record Number: [id number] Patient Account Number: [id number] Date of Birth/Sex: Treating RN: [birth year] (88 y.o. [name] Primary Care Provider: Marylynn Pearson Other Clinician: Referring Provider: Treating Provider/Extender: Lorna Dibble Weeks in Treatment: 6 Constitutional Well-nourished and well-hydrated in no acute distress. Respiratory normal breathing without difficulty. Psychiatric this patient [name] able to make decisions and demonstrates good insight into disease process. Alert and Oriented x 3. pleasant and cooperative. Notes Upon inspection patient's wound did not require sharp debridement she seems to be tolerating the dressing changes without complication and in general I am very pleased with where we stand currently. Electronic Signature(s) Signed: 04/05/2022 3:16:51 PM By: Lenda Kelp PA-C Entered By: Lenda Kelp on 04/05/2022 15:16:51 -------------------------------------------------------------------------------- Physician Orders Details Patient Name: Date of Service: [name], [name] 04/05/2022 2:30 PM Medical Record Number:  [id number] Patient Account Number: [id number] Date of Birth/Sex: Treating RN: 09-30-1934 (88 y.o. [name] Primary Care Provider: Marylynn Pearson Other Clinician: Referring Provider: Treating Provider/Extender: Lorna Dibble Weeks in Treatment: 6 Verbal / Phone Orders: No Diagnosis Coding ICD-10 Coding Code Description [phone] Pressure ulcer of other site, stage 3 I50.42 Chronic combined systolic (congestive) and diastolic (congestive) heart failure F01.511 Vascular dementia, unspecified severity, with agitation J44.9 Chronic obstructive pulmonary disease, unspecified Follow-up Appointments Return Appointment in 1 week. Bathing/ [employer] wounds with antibacterial soap and water. Anesthetic (Use 'Patient Medications' Section for Anesthetic Order Entry) Lidocaine applied to wound bed Edema Control - Lymphedema / Segmental Compressive Device / Other Elevate, Exercise Daily and A void Standing for Long Periods of Time. Elevate legs to the level of the heart and pump ankles as often as possible Elevate leg(s) parallel to the floor when sitting. Wound Treatment Wound #1 - Foot Wound Laterality: Left, Medial, Distal Cleanser: Byram Ancillary Kit - 15 Day Supply (Generic) 3 x Per Week/30 Days Discharge Instructions: Use supplies as instructed; Kit contains: (15) Saline Bullets; (15) 3x3 Gauze; 15 pr Gloves Cleanser: Soap and Water 3 x Per Week/30 Days Discharge Instructions: Gently cleanse wound with antibacterial soap, rinse and pat dry prior to dressing wounds Prim Dressing: Endoform Natural, Non-fenestrated, 2x2 (in/in) (Dispense As Written) 3 x Per Week/30 Days ary Secondary Dressing: (BORDER) Zetuvit Plus SILICONE BORDER Dressing 4x4 (in/in) (Dispense As Written) 3 x Per Week/30 Days [name], Lacey L ([id number]) [id number] Page 3 of 5 Discharge Instructions: Please do not put silicone bordered dressings under wraps. Use  non-bordered dressing only. Electronic Signature(s) Signed: 04/05/2022 5:15:34 PM By: Yevonne Pax RN Signed: 04/09/2022 8:47:01 AM By: Lenda Kelp PA-C Entered By: Yevonne Pax on 04/05/2022 14:55:59 -------------------------------------------------------------------------------- Problem List Details Patient Name: Date of Service: [name],  [name] 04/05/2022 2:30 PM Medical Record Number: [id number] Patient Account Number: [id number] Date of Birth/Sex: Treating RN: 07-20-1934 (88 y.o. [name] Primary Care Provider: Marylynn Pearson Other Clinician: Referring Provider: Treating Provider/Extender: Lorna Dibble Weeks in Treatment: 6 Active Problems ICD-10 Encounter Code Description Active Date MDM Diagnosis L89.893 Pressure ulcer of other site, stage 3 02/22/2022 No Yes I50.42 Chronic combined systolic (congestive) and diastolic (congestive) heart failure 02/22/2022 No Yes F01.511 Vascular dementia, unspecified severity, with agitation 02/22/2022 No Yes J44.9 Chronic obstructive pulmonary disease, unspecified 02/22/2022 No Yes Inactive Problems Resolved Problems Electronic Signature(s) Signed: 04/05/2022 2:45:11 PM By: Lenda Kelp PA-C Entered By: Lenda Kelp on 04/05/2022 14:45:10 -------------------------------------------------------------------------------- Progress Note Details Patient Name: Date of Service: [name], [name] 04/05/2022 2:30 PM Medical Record Number: [id number] Patient Account Number: [id number] Date of Birth/Sex: Treating RN: 07-17-1934 (88 y.o. [name] Primary Care Provider: Marylynn Pearson Other Clinician: Referring Provider: Treating Provider/Extender: Lorna Dibble Weeks in Treatment: 6 Subjective Chief Complaint Information obtained from Patient Left foot pressure ulcer History of Present Illness (HPI) 02-22-2022 upon evaluation today patient appears to be doing somewhat poorly in regard to her left  medial first metatarsal head where she does have a bunion. Unfortunately this [name] causing some pressure and has led to a pressure injury at this site. Fortunately I do not see any signs of infection at this point but that [name] something we definitely can need to keep a close eye on. Fortunately there does not appear to be any signs of active infection locally nor systemically at this KIRTI, CARL ([id number]) [id number] Page 4 of 5 point. No fevers, chills, nausea, vomiting, or diarrhea. Patient does have a history of of vascular dementia, COPD, congestive heart failure, and the dementia does affect her ability to be able to in some cases follow directions. She [name] very nervous about things in general. This [name] stated to have started somewhere around January 22, 2022. 03-02-2022 upon evaluation today patient appears to be doing well currently in regard to her wounds all things considered. Fortunately I do not see any signs of infection unfortunately both wounds are still open and the one on the medial foot first metatarsal location [name] still the worst. Although the area over the fifth toe right foot actually [name] also open at this point unfortunately. With that being said I do think we can need to continue to monitor for any signs of worsening or infection although right now I think she [name] doing okay in that regard. 3/4; this [name] a patient with a bunion wound on the left medial foot. She also had a small blister on the right lateral fifth toe. She went for x-rays last week no acute fracture or dislocation was seen on the left side she had worsened moderate hallucis valgus and moderate second and third fourth fifth tarsometatarsal osteoarthritis. We use Prisma. 3/11; patient presents for follow-up. She has been using endoform to the left medial foot wound. She has cushion pads that she uses in between the toes to help offload the areas and prevent future wounds. The right lateral  fifth met head remains closed. 03-22-2022 upon evaluation today patient appears to be doing well currently in regard to her wound. She does show some signs of improvement here. She [name] also showing some signs of being a little bit too moist I think we may want to cut back on the hydrogel possibly just using the saline only at  this point she [name] in agreement with that plan. Fortunately I do not see any evidence of active infection locally nor systemically which [name] great news. No fevers, chills, nausea, vomiting, or diarrhea. 03-29-22 upon evaluation today patient actually appears to be making excellent progress and very pleased with how things appear and how the wound [name] doing. I do think that we are moving in the right direction and very pleased in that regard. No fevers, chills, nausea, vomiting, or diarrhea. 04-05-2022 upon evaluation today patient appears to be doing well currently in regard to her wound. This [name] actually showing signs of improvement. Fortunately there does not appear to be any signs of active infection locally nor systemically at this time which [name] great news. No fevers, chills, nausea, vomiting, or diarrhea. Objective Constitutional Well-nourished and well-hydrated in no acute distress. Vitals Time Taken: 2:36 PM, Height: 62 in, Weight: 105 lbs, BMI: 19.2, Temperature: 98 F, Pulse: 70 bpm, Respiratory Rate: 16 breaths/min, Blood Pressure: 123/79 mmHg. Respiratory normal breathing without difficulty. Psychiatric this patient [name] able to make decisions and demonstrates good insight into disease process. Alert and Oriented x 3. pleasant and cooperative. General Notes: Upon inspection patient's wound did not require sharp debridement she seems to be tolerating the dressing changes without complication and in general I am very pleased with where we stand currently. Integumentary (Hair, Skin) Wound #1 status [name] Open. Original cause of wound was Gradually Appeared. The date acquired  was: 01/22/2022. The wound has been in treatment 6 weeks. The wound [name] located on the Left,Distal,Medial Foot. The wound measures 0.6cm length x 0.4cm width x 0.1cm depth; 0.188cm^2 area and 0.019cm^3 volume. There [name] Fat Layer (Subcutaneous Tissue) exposed. There [name] no tunneling or undermining noted. There [name] a medium amount of serosanguineous drainage noted. There [name] small (1-33%) pink granulation within the wound bed. There [name] a large (67-100%) amount of necrotic tissue within the wound bed including Adherent Slough. Assessment Active Problems ICD-10 Pressure ulcer of other site, stage 3 Chronic combined systolic (congestive) and diastolic (congestive) heart failure Vascular dementia, unspecified severity, with agitation Chronic obstructive pulmonary disease, unspecified Plan Follow-up Appointments: Return Appointment in 1 week. Bathing/ Shower/ Hygiene: Wash wounds with antibacterial soap and water. Anesthetic (Use 'Patient Medications' Section for Anesthetic Order Entry): Lidocaine applied to wound bed [name], Lacey L ([id number]) [id number] Page 5 of 5 Edema Control - Lymphedema / Segmental Compressive Device / Other: Elevate, Exercise Daily and Avoid Standing for Long Periods of Time. Elevate legs to the level of the heart and pump ankles as often as possible Elevate leg(s) parallel to the floor when sitting. WOUND #1: - Foot Wound Laterality: Left, Medial, Distal Cleanser: Byram Ancillary Kit - 15 Day Supply (Generic) 3 x Per Week/30 Days Discharge Instructions: Use supplies as instructed; Kit contains: (15) Saline Bullets; (15) 3x3 Gauze; 15 pr Gloves Cleanser: Soap and Water 3 x Per Week/30 Days Discharge Instructions: Gently cleanse wound with antibacterial soap, rinse and pat dry prior to dressing wounds Prim Dressing: Endoform Natural, Non-fenestrated, 2x2 (in/in) (Dispense As Written) 3 x Per Week/30 Days ary Secondary Dressing: (BORDER) Zetuvit  Plus SILICONE BORDER Dressing 4x4 (in/in) (Dispense As Written) 3 x Per Week/30 Days Discharge Instructions: Please do not put silicone bordered dressings under wraps. Use non-bordered dressing only. 1. Recommend currently that we have the patient continue to monitor for any signs of infection or worsening. 2. I am good recommend as well that she continue with the endoform I think this [name] doing  a good job. 3 were also going to continue to wear the bordered foam dressing to cover. We will see patient back for reevaluation in 1 week here in the clinic. If anything worsens or changes patient will contact our office for additional recommendations. Electronic Signature(s) Signed: 04/05/2022 3:17:19 PM By: Lenda Kelp PA-C Entered By: Lenda Kelp on 04/05/2022 15:17:19 -------------------------------------------------------------------------------- SuperBill Details Patient Name: Date of Service: [name], Doralyn L. 04/05/2022 Medical Record Number: [id number] Patient Account Number: [id number] Date of Birth/Sex: Treating RN: 12/21/1934 (88 y.o. [name] Primary Care Provider: Marylynn Pearson Other Clinician: Referring Provider: Treating Provider/Extender: Lorna Dibble Weeks in Treatment: 6 Diagnosis Coding ICD-10 Codes Code Description [phone] Pressure ulcer of other site, stage 3 I50.42 Chronic combined systolic (congestive) and diastolic (congestive) heart failure F01.511 Vascular dementia, unspecified severity, with agitation J44.9 Chronic obstructive pulmonary disease, unspecified Facility Procedures : CPT4 Code: [id number] Description: 62130 - WOUND CARE VISIT-LEV 2 EST PT Modifier: Quantity: 1 Physician Procedures : CPT4 Code Description Modifier [id number] - WC PHYS LEVEL 3 - EST PT ICD-10 Diagnosis Description L89.893 Pressure ulcer of other site, stage 3 I50.42 Chronic combined systolic (congestive) and diastolic (congestive) heart failure F01.511  Vascular  dementia, unspecified severity, with agitation J44.9 Chronic obstructive pulmonary disease, unspecified Quantity: 1 Electronic Signature(s) Signed: 04/05/2022 3:17:30 PM By: Lenda Kelp PA-C Entered By: Lenda Kelp on 04/05/2022 15:17:30

## 2022-04-06 NOTE — Progress Notes (Signed)
[name], NUFFER ([id number] Page 1 of 7 Visit Report for 04/05/2022 Arrival Information Details Patient Name: Date of Service: [place] 04/05/2022 2:30 PM Medical Record Number: [id number] Patient Account Number: [id number] Date of Birth/Sex: Treating RN: Mar 17, 1934 (88 y.o. [name] Primary Care Karigan Cloninger: Denyce Robert Other Clinician: Referring Preston Garabedian: Treating Cinderella Christoffersen/Extender: Norwood Levo Weeks in Treatment: 6 Visit Information History Since Last Visit Added or deleted any medications: No Patient Arrived: Wheel Chair Any new allergies or adverse reactions: No Arrival Time: 14:36 Had a fall or experienced change in No Accompanied By: daughter activities of daily living that may affect Transfer Assistance: None risk of falls: Patient Identification Verified: Yes Signs or symptoms of abuse/neglect since last visito No Secondary Verification Process Completed: Yes Hospitalized since last visit: No Patient Requires Transmission-Based Precautions: No Implantable device outside of the clinic excluding No Patient Has Alerts: No cellular tissue based products placed in the center since last visit: Has Dressing in Place as Prescribed: Yes Pain Present Now: No Electronic Signature(s) Signed: 04/05/2022 5:15:34 PM By: Carlene Coria RN Entered By: Carlene Coria on 04/05/2022 14:36:41 -------------------------------------------------------------------------------- Clinic Level of Care Assessment Details Patient Name: Date of Service: [name], [name] 04/05/2022 2:30 PM Medical Record Number: [id number] Patient Account Number: [id number] Date of Birth/Sex: Treating RN: [birth year] (88 y.o. [name] Primary Care Vernel Langenderfer: Denyce Robert Other Clinician: Referring Delpha Perko: Treating Zander Ingham/Extender: Norwood Levo Weeks in Treatment: 6 Clinic Level of Care Assessment Items TOOL 4 Quantity  Score X- 1 0 Use when only an EandM is performed on FOLLOW-UP visit ASSESSMENTS - Nursing Assessment / Reassessment X- 1 10 Reassessment of Co-morbidities (includes updates in patient status) X- 1 5 Reassessment of Adherence to Treatment Plan ASSESSMENTS - Wound and Skin A ssessment / Reassessment X - Simple Wound Assessment / Reassessment - one wound 1 5 []  - 0 Complex Wound Assessment / Reassessment - multiple wounds []  - 0 Dermatologic / Skin Assessment (not related to wound area) ASSESSMENTS - Focused Assessment []  - 0 Circumferential Edema Measurements - multi extremities []  - 0 Nutritional Assessment / Counseling / Intervention []  - 0 Lower Extremity Assessment (monofilament, tuning fork, pulses) []  - 0 Peripheral Arterial Disease Assessment (using hand held doppler) ASSESSMENTS - Ostomy and/or Continence Assessment and Care []  - 0 Incontinence Assessment and Management []  - 0 Ostomy Care Assessment and Management (repouching, etc.) PROCESS - Coordination of Care X - Simple Patient / Family Education for ongoing care 1 15 [place], [place] ([id number] Page 2 of 7 []  - 0 Complex (extensive) Patient / Family Education for ongoing care []  - 0 Staff obtains Programmer, systems, Records, T Results / Process Orders est []  - 0 Staff telephones HHA, Nursing Homes / Clarify orders / etc []  - 0 Routine Transfer to another Facility (non-emergent condition) []  - 0 Routine Hospital Admission (non-emergent condition) []  - 0 New Admissions / Biomedical engineer / Ordering NPWT Apligraf, etc. , []  - 0 Emergency Hospital Admission (emergent condition) X- 1 10 Simple Discharge Coordination []  - 0 Complex (extensive) Discharge Coordination PROCESS - Special Needs []  - 0 Pediatric / Minor Patient Management []  - 0 Isolation Patient Management []  - 0 Hearing / Language / Visual special needs []  - 0 Assessment of Community assistance  (transportation, D/C planning, etc.) []  - 0 Additional assistance / Altered mentation []  - 0 Support Surface(s) Assessment (bed, cushion, seat, etc.) INTERVENTIONS - Wound Cleansing / Measurement X - Simple  Wound Cleansing - one wound 1 5 []  - 0 Complex Wound Cleansing - multiple wounds X- 1 5 Wound Imaging (photographs - any number of wounds) []  - 0 Wound Tracing (instead of photographs) X- 1 5 Simple Wound Measurement - one wound []  - 0 Complex Wound Measurement - multiple wounds INTERVENTIONS - Wound Dressings X - Small Wound Dressing one or multiple wounds 1 10 []  - 0 Medium Wound Dressing one or multiple wounds []  - 0 Large Wound Dressing one or multiple wounds []  - 0 Application of Medications - topical []  - 0 Application of Medications - injection INTERVENTIONS - Miscellaneous []  - 0 External ear exam []  - 0 Specimen Collection (cultures, biopsies, blood, body fluids, etc.) []  - 0 Specimen(s) / Culture(s) sent or taken to Lab for analysis []  - 0 Patient Transfer (multiple staff / Civil Service fast streamer / Similar devices) []  - 0 Simple Staple / Suture removal (25 or less) []  - 0 Complex Staple / Suture removal (26 or more) []  - 0 Hypo / Hyperglycemic Management (close monitor of Blood Glucose) []  - 0 Ankle / Brachial Index (ABI) - do not check if billed separately X- 1 5 Vital Signs Has the patient been seen at the hospital within the last three years: Yes Total Score: 75 Level Of Care: New/Established - Level 2 Electronic Signature(s) Signed: 04/05/2022 5:15:34 PM By: Carlene Coria RN Entered By: Carlene Coria on 04/05/2022 14:47:36 [name] ([id number] Page 3 of 7 -------------------------------------------------------------------------------- Encounter Discharge Information Details Patient Name: Date of Service: [name] 04/05/2022 2:30 PM Medical Record Number: [id number] Patient Account Number: [id number] Date of  Birth/Sex: Treating RN: [birth year] (88 y.o. [name] Primary Care Jocsan Mcginley: Denyce Robert Other Clinician: Referring Pegah Segel: Treating Kashauna Celmer/Extender: Clois Comber in Treatment: 6 Encounter Discharge Information Items Discharge Condition: Stable Ambulatory Status: Wheelchair Discharge Destination: Home Transportation: Private Auto Accompanied By: daughter Schedule Follow-up Appointment: Yes Clinical Summary of Care: Electronic Signature(s) Signed: 04/05/2022 5:15:34 PM By: Carlene Coria RN Entered By: Carlene Coria on 04/05/2022 14:48:14 -------------------------------------------------------------------------------- Lower Extremity Assessment Details Patient Name: Date of Service: [name], Lucas L. 04/05/2022 2:30 PM Medical Record Number: [id number] Patient Account Number: [id number] Date of Birth/Sex: Treating RN: [birth year] (88 y.o. [name] Primary Care Voris Tigert: Denyce Robert Other Clinician: Referring Jasmin Trumbull: Treating Lafayette Dunlevy/Extender: Norwood Levo Weeks in Treatment: 6 Edema Assessment Assessed: [Left: No] [Right: No] Edema: [Left: N] [Right: o] Vascular Assessment Pulses: Dorsalis Pedis Palpable: [Left:Yes] Electronic Signature(s) Signed: 04/05/2022 5:15:34 PM By: Carlene Coria RN Entered By: Carlene Coria on 04/05/2022 14:40:19 -------------------------------------------------------------------------------- Multi Wound Chart Details Patient Name: Date of Service: [name], Ortencia L. 04/05/2022 2:30 PM Medical Record Number: [id number] Patient Account Number: [id number] Date of Birth/Sex: Treating RN: 01-28-1934 (88 y.o. [name] Primary Care Windie Marasco: Denyce Robert Other Clinician: Referring Jermario Kalmar: Treating Pradeep Beaubrun/Extender: Norwood Levo Weeks in Treatment: 6 Vital Signs Height(in): 62 Pulse(bpm): 70 Weight(lbs): 105 Blood Pressure(mmHg): 123/79 Body Mass Index(BMI):  19.2 Temperature(F): 98 Respiratory Rate(breaths/min): 16 Paolucci, Halsey L ([id number]) [id number] Page 4 of 7 [1:Photos:] [N/A:N/A] Left, Distal, Medial Foot N/A N/A Wound Location: Gradually Appeared N/A N/A Wounding Event: Pressure Ulcer N/A N/A Primary Etiology: Chronic Obstructive Pulmonary N/A N/A Comorbid History: Disease (COPD), Congestive Heart Failure, Dementia 01/22/2022 N/A N/A Date Acquired: 6 N/A N/A Weeks of Treatment: Open N/A N/A Wound Status: No N/A N/A Wound Recurrence: 0.6x0.4x0.1 N/A N/A Measurements L x W x D (cm) 0.188  N/A N/A A (cm) : rea 0.019 N/A N/A Volume (cm) : -164.80% N/A N/A % Reduction in A rea: -35.70% N/A N/A % Reduction in Volume: Category/Stage III N/A N/A Classification: Medium N/A N/A Exudate A mount: Serosanguineous N/A N/A Exudate Type: red, brown N/A N/A Exudate Color: Small (1-33%) N/A N/A Granulation A mount: Pink N/A N/A Granulation Quality: Large (67-100%) N/A N/A Necrotic A mount: Fat Layer (Subcutaneous Tissue): Yes N/A N/A Exposed Structures: Fascia: No Tendon: No Muscle: No Joint: No Bone: No Medium (34-66%) N/A N/A Epithelialization: Treatment Notes Electronic Signature(s) Signed: 04/05/2022 5:15:34 PM By: Carlene Coria RN Entered By: Carlene Coria on 04/05/2022 14:40:23 -------------------------------------------------------------------------------- Multi-Disciplinary Care Plan Details Patient Name: Date of Service: [name], Arline L. 04/05/2022 2:30 PM Medical Record Number: [id number] Patient Account Number: [id number] Date of Birth/Sex: Treating RN: [birth year] (88 y.o. [name] Primary Care Amish Mintzer: Denyce Robert Other Clinician: Referring Yaris Ferrell: Treating Seger Jani/Extender: Norwood Levo Weeks in Treatment: 6 Active Inactive Wound/Skin Impairment Nursing Diagnoses: Knowledge deficit related to ulceration/compromised skin  integrity Goals: Patient/caregiver will verbalize understanding of skin care regimen Date Initiated: 02/22/2022 Target Resolution Date: 04/23/2022 Goal Status: Active Ulcer/skin breakdown will have a volume reduction of 30% by week 4 Date Initiated: 02/22/2022 Date Inactivated: 04/05/2022 Target Resolution Date: 03/23/2022 Goal Status: Unmet Unmet Reason: comorbities Ulcer/skin breakdown will have a volume reduction of 50% by week Castine, Sanders L ([id number] Page 5 of 7 Date Initiated: 02/22/2022 Target Resolution Date: 04/23/2022 Goal Status: Active Ulcer/skin breakdown will have a volume reduction of 80% by week 12 Date Initiated: 02/22/2022 Target Resolution Date: 05/23/2022 Goal Status: Active Ulcer/skin breakdown will heal within 14 weeks Date Initiated: 02/22/2022 Target Resolution Date: 06/23/2022 Goal Status: Active Interventions: Assess patient/caregiver ability to obtain necessary supplies Assess patient/caregiver ability to perform ulcer/skin care regimen upon admission and as needed Assess ulceration(s) every visit Notes: Electronic Signature(s) Signed: 04/05/2022 5:15:34 PM By: Carlene Coria RN Entered By: Carlene Coria on 04/05/2022 14:41:37 -------------------------------------------------------------------------------- Pain Assessment Details Patient Name: Date of Service: [name], Rechel L. 04/05/2022 2:30 PM Medical Record Number: [id number] Patient Account Number: [id number] Date of Birth/Sex: Treating RN: Jun 08, 1934 (88 y.o. [name] Primary Care Kabella Cassidy: Denyce Robert Other Clinician: Referring Nayomi Tabron: Treating Ghalia Reicks/Extender: Norwood Levo Weeks in Treatment: 6 Active Problems Location of Pain Severity and Description of Pain Patient Has Paino No Site Locations Pain Management and Medication Current Pain Management: Electronic Signature(s) Signed: 04/05/2022 5:15:34 PM By: Carlene Coria RN Entered  By: Carlene Coria on 04/05/2022 14:37:12 -------------------------------------------------------------------------------- Patient/Caregiver Education Details Patient Name: Date of Service: DA Harlan Stains 4/1/2024andnbsp2:30 PM Medical Record Number: [id number] Patient Account Number: [id number] Date of Birth/Gender: Treating RN: 05-27-1934 (88 y.o. [name] Primary Care Physician: Denyce Robert Other Clinician: Referring Physician: Treating Physician/Extender: Norwood Levo Weeks in Treatment: [place], [place] ([id number] Page 6 of 7 Education Assessment Education Provided To: Patient Education Topics Provided Wound Debridement: Handouts: Wound Debridement Methods: Explain/Verbal Responses: State content correctly Electronic Signature(s) Signed: 04/05/2022 5:15:34 PM By: Carlene Coria RN Entered By: Carlene Coria on 04/05/2022 14:42:18 -------------------------------------------------------------------------------- Wound Assessment Details Patient Name: Date of Service: [name], Kay L. 04/05/2022 2:30 PM Medical Record Number: [id number] Patient Account Number: [id number] Date of Birth/Sex: Treating RN: [birth year] (88 y.o. [name] Primary Care Panagiota Perfetti: Denyce Robert Other Clinician: Referring Johana Hopkinson: Treating Hairo Garraway/Extender: Norwood Levo Weeks in Treatment: 6 Wound Status Wound Number: 1 Primary Pressure Ulcer  Etiology: Wound Location: Left, Distal, Medial Foot Wound Open Wounding Event: Gradually Appeared Status: Date Acquired: 01/22/2022 Comorbid Chronic Obstructive Pulmonary Disease (COPD), Congestive Weeks Of Treatment: 6 History: Heart Failure, Dementia Clustered Wound: No Photos Wound Measurements Length: (cm) 0.6 Width: (cm) 0.4 Depth: (cm) 0.1 Area: (cm) 0.188 Volume: (cm) 0.019 % Reduction in Area: -164.8% % Reduction in Volume: -35.7% Epithelialization: Medium  (34-66%) Tunneling: No Undermining: No Wound Description Classification: Category/Stage III Exudate Amount: Medium Exudate Type: Serosanguineous Exudate Color: red, brown Foul Odor After Cleansing: No Slough/Fibrino Yes Wound Bed Granulation Amount: Small (1-33%) Exposed Structure Granulation Quality: Pink Fascia Exposed: No Necrotic Amount: Large (67-100%) Fat Layer (Subcutaneous Tissue) Exposed: Yes Necrotic Quality: Adherent Slough Tendon Exposed: No Muscle Exposed: No Joint Exposed: No Schley, [place]itt[place] L ([id number] Page 7 of 7 Bone Exposed: No Treatment Notes Wound #1 (Foot) Wound Laterality: Left, Medial, Distal Cleanser [place] Ancillary Kit - 15 Day Supply Discharge Instruction: Use supplies as instructed; Kit contains: (15) Saline Bullets; (15) 3x3 Gauze; 15 pr Gloves Soap and Water Discharge Instruction: Gently cleanse wound with antibacterial soap, rinse and pat dry prior to dressing wounds Peri-Wound Care Topical Primary Dressing Endoform Natural, Non-fenestrated, 2x2 (in/in) Secondary Dressing (BORDER) Zetuvit Plus SILICONE BORDER Dressing 4x4 (in/in) Discharge Instruction: Please do not put silicone bordered dressings under wraps. Use non-bordered dressing only. Secured With Compression Wrap Compression Stockings Environmental education officer) Signed: 04/05/2022 5:15:34 PM By: Carlene Coria RN Entered By: Carlene Coria on 04/05/2022 14:40:02 -------------------------------------------------------------------------------- Vitals Details Patient Name: Date of Service: [name], Talayeh L. 04/05/2022 2:30 PM Medical Record Number: [id number] Patient Account Number: [id number] Date of Birth/Sex: Treating RN: 05/02/1934 (88 y.o. [name] Primary Care Valencia Kassa: Denyce Robert Other Clinician: Referring Icess Bertoni: Treating Shekita Boyden/Extender: Norwood Levo Weeks in Treatment: 6 Vital Signs Time Taken:  14:36 Temperature (F): 98 Height (in): 62 Pulse (bpm): 70 Weight (lbs): 105 Respiratory Rate (breaths/min): 16 Body Mass Index (BMI): 19.2 Blood Pressure (mmHg): 123/79 Reference Range: 80 - 120 mg / dl Electronic Signature(s) Signed: 04/05/2022 5:15:34 PM By: Carlene Coria RN Entered By: Carlene Coria on 04/05/2022 14:37:06

## 2022-04-12 ENCOUNTER — Other Ambulatory Visit: Payer: Self-pay | Admitting: Specialist

## 2022-04-12 ENCOUNTER — Encounter: Payer: Medicare Other | Admitting: Physician Assistant

## 2022-04-12 DIAGNOSIS — R0602 Shortness of breath: Secondary | ICD-10-CM

## 2022-04-12 DIAGNOSIS — L89893 Pressure ulcer of other site, stage 3: Secondary | ICD-10-CM | POA: Diagnosis not present

## 2022-04-12 DIAGNOSIS — J849 Interstitial pulmonary disease, unspecified: Secondary | ICD-10-CM

## 2022-04-12 NOTE — Progress Notes (Addendum)
[name], [name] ([id number]) [id number] Page 1 of 7 Visit Report for 04/12/2022 Arrival Information Details Patient Name: Date of Service: [name], [name] 04/12/2022 3:45 PM Medical Record Number: [id number] Patient Account Number: [id number] Date of Birth/Sex: Treating RN: 03/23/1934 (88 y.o. [name] Primary Care Matsuko Kretz: Marylynn Pearson Other Clinician: Betha Loa Referring Niani Mourer: Treating Traye Bates/Extender: Lorna Dibble Weeks in Treatment: 7 Visit Information History Since Last Visit Added or deleted any medications: Yes Patient Arrived: Wheel Chair Any new allergies or adverse reactions: No Arrival Time: 15:44 Had a fall or experienced change in No Accompanied By: daughter activities of daily living that may affect Transfer Assistance: EasyPivot Patient Lift risk of falls: Patient Identification Verified: Yes Hospitalized since last visit: No Secondary Verification Process Completed: Yes Has Dressing in Place as Prescribed: Yes Patient Requires Transmission-Based Precautions: No Pain Present Now: No Patient Has Alerts: No Electronic Signature(s) Signed: 04/12/2022 4:43:19 PM By: Angelina Pih Entered By: Angelina Pih on 04/12/2022 15:44:56 -------------------------------------------------------------------------------- Clinic Level of Care Assessment Details Patient Name: Date of Service: [name], [name]. 04/12/2022 3:45 PM Medical Record Number: [id number] Patient Account Number: [id number] Date of Birth/Sex: Treating RN: 27-Mar-1934 (88 y.o. [name] Primary Care Emersynn Deatley: Marylynn Pearson Other Clinician: Referring Taje Littler: Treating Delany Steury/Extender: Lorna Dibble Weeks in Treatment: 7 Clinic Level of Care Assessment Items TOOL 1 Quantity Score []  - 0 Use when EandM and Procedure [name] performed on INITIAL visit ASSESSMENTS - Nursing Assessment / Reassessment []  - 0 General  Physical Exam (combine w/ comprehensive assessment (listed just below) when performed on new pt. evals) []  - 0 Comprehensive Assessment (HX, ROS, Risk Assessments, Wounds Hx, etc.) ASSESSMENTS - Wound and Skin Assessment / Reassessment []  - 0 Dermatologic / Skin Assessment (not related to wound area) ASSESSMENTS - Ostomy and/or Continence Assessment and Care []  - 0 Incontinence Assessment and Management []  - 0 Ostomy Care Assessment and Management (repouching, etc.) PROCESS - Coordination of Care []  - 0 Simple Patient / Family Education for ongoing care []  - 0 Complex (extensive) Patient / Family Education for ongoing care []  - 0 Staff obtains Chiropractor, Records, T Results / Process Orders est []  - 0 Staff telephones HHA, Nursing Homes / Clarify orders / etc []  - 0 Routine Transfer to another Facility (non-emergent condition) []  - 0 Routine Hospital Admission (non-emergent condition) []  - 0 New Admissions / Insurance Authorizations / Ordering NPWT Apligraf, etc. , []  - 0 Emergency Hospital Admission (emergent condition) PROCESS - Special Needs [place], Benita [name] ([id number]) [id number] Page 2 of 7 []  - 0 Pediatric / Minor Patient Management []  - 0 Isolation Patient Management []  - 0 Hearing / Language / Visual special needs []  - 0 Assessment of Community assistance (transportation, D/C planning, etc.) []  - 0 Additional assistance / Altered mentation []  - 0 Support Surface(s) Assessment (bed, cushion, seat, etc.) INTERVENTIONS - Miscellaneous []  - 0 External ear exam []  - 0 Patient Transfer (multiple staff / Nurse, adult / Similar devices) []  - 0 Simple Staple / Suture removal (25 or less) []  - 0 Complex Staple / Suture removal (26 or more) []  - 0 Hypo/Hyperglycemic Management (do not check if billed separately) []  - 0 Ankle / Brachial Index (ABI) - do not check if billed separately Has the patient been seen at the hospital within the last  three years: Yes Total Score: 0 Level Of Care: ____ Electronic Signature(s) Signed: 04/12/2022 4:43:19 PM By: Angelina Pih Entered By: Angelina Pih  on 04/12/2022 16:15:38 -------------------------------------------------------------------------------- Encounter Discharge Information Details Patient Name: Date of Service: [name], [name] 04/12/2022 3:45 PM Medical Record Number: [id number] Patient Account Number: [id number] Date of Birth/Sex: Treating RN: 06-08-1934 (88 y.o. [name] Primary Care Mackinzee Roszak: Marylynn Pearson Other Clinician: Referring Lanissa Cashen: Treating Nykayla Marcelli/Extender: Lorna Dibble Weeks in Treatment: 7 Encounter Discharge Information Items Post Procedure Vitals Discharge Condition: Stable Temperature (F): 97.7 Ambulatory Status: Wheelchair Pulse (bpm): 83 Discharge Destination: Home Respiratory Rate (breaths/min): 18 Transportation: Private Auto Blood Pressure (mmHg): 110/77 Accompanied By: family Schedule Follow-up Appointment: Yes Clinical Summary of Care: Electronic Signature(s) Signed: 04/12/2022 4:43:19 PM By: Angelina Pih Entered By: Angelina Pih on 04/12/2022 16:17:06 -------------------------------------------------------------------------------- Lower Extremity Assessment Details Patient Name: Date of Service: [name], [name]. 04/12/2022 3:45 PM Medical Record Number: [id number] Patient Account Number: [id number] Date of Birth/Sex: Treating RN: 1934-04-21 (88 y.o. [name] Primary Care Antionne Enrique: Marylynn Pearson Other Clinician: Betha Loa Referring Armistead Sult: Treating Nakoa Ganus/Extender: Lorna Dibble Weeks in Treatment: 7 Vascular Assessment Pulses: Dorsalis Pedis Doppler Audible: [Left:Yes] [Right:Yes] Posterior Tibial [name] ([id number]) [Left:Yes] [Right:[id number] Page 3 of 7 Yes] Electronic Signature(s) Signed: 04/12/2022 4:43:19 PM By: Angelina Pih Entered By: Angelina Pih on 04/12/2022 15:52:52 -------------------------------------------------------------------------------- Multi Wound Chart Details Patient Name: Date of Service: [name], [name]. 04/12/2022 3:45 PM Medical Record Number: [id number] Patient Account Number: [id number] Date of Birth/Sex: Treating RN: 08/05/1934 (88 y.o. [name] Primary Care Babette Stum: Marylynn Pearson Other Clinician: Betha Loa Referring Providence Stivers: Treating Calista Crain/Extender: Lorna Dibble Weeks in Treatment: 7 Vital Signs Height(in): 62 Pulse(bpm): 83 Weight(lbs): 105 Blood Pressure(mmHg): 110/77 Body Mass Index(BMI): 19.2 Temperature(F): 97.7 Respiratory Rate(breaths/min): 18 [1:Photos:] [N/A:N/A] Left, Distal, Medial Foot N/A N/A Wound Location: Gradually Appeared N/A N/A Wounding Event: Pressure Ulcer N/A N/A Primary Etiology: Chronic Obstructive Pulmonary N/A N/A Comorbid History: Disease (COPD), Congestive Heart Failure, Dementia 01/22/2022 N/A N/A Date Acquired: 7 N/A N/A Weeks of Treatment: Open N/A N/A Wound Status: No N/A N/A Wound Recurrence: 0.7x0.4x0.1 N/A N/A Measurements [name] x W x D (cm) 0.22 N/A N/A A (cm) : rea 0.022 N/A N/A Volume (cm) : -209.90% N/A N/A % Reduction in A rea: -57.10% N/A N/A % Reduction in Volume: Category/Stage III N/A N/A Classification: Medium N/A N/A Exudate A mount: Serosanguineous N/A N/A Exudate Type: red, brown N/A N/A Exudate Color: Small (1-33%) N/A N/A Granulation A mount: Pink N/A N/A Granulation Quality: Large (67-100%) N/A N/A Necrotic A mount: Fat Layer (Subcutaneous Tissue): Yes N/A N/A Exposed Structures: Fascia: No Tendon: No Muscle: No Joint: No Bone: No Medium (34-66%) N/A N/A Epithelialization: Treatment Notes Electronic Signature(s) Signed: 04/12/2022 4:43:19 PM By: Angelina Pih Entered By: Angelina Pih on 04/12/2022 15:59:05 Slay, Isra [name] ([id number])  [id number] Page 4 of 7 -------------------------------------------------------------------------------- Multi-Disciplinary Care Plan Details Patient Name: Date of Service: Vedia Pereyra, [name]. 04/12/2022 3:45 PM Medical Record Number: [id number] Patient Account Number: [id number] Date of Birth/Sex: Treating RN: 1934/11/21 (88 y.o. [name] Primary Care Wessley Emert: Marylynn Pearson Other Clinician: Referring Kiannah Grunow: Treating Destiny Hagin/Extender: Lorna Dibble Weeks in Treatment: 7 Active Inactive Wound/Skin Impairment Nursing Diagnoses: Knowledge deficit related to ulceration/compromised skin integrity Goals: Patient/caregiver will verbalize understanding of skin care regimen Date Initiated: 02/22/2022 Target Resolution Date: 04/23/2022 Goal Status: Active Ulcer/skin breakdown will have a volume reduction of 30% by week 4 Date Initiated: 02/22/2022 Date Inactivated: 04/05/2022 Target Resolution Date: 03/23/2022 Goal Status: Unmet Unmet Reason: comorbities Ulcer/skin breakdown will  have a volume reduction of 50% by week 8 Date Initiated: 02/22/2022 Target Resolution Date: 04/23/2022 Goal Status: Active Ulcer/skin breakdown will have a volume reduction of 80% by week 12 Date Initiated: 02/22/2022 Target Resolution Date: 05/23/2022 Goal Status: Active Ulcer/skin breakdown will heal within 14 weeks Date Initiated: 02/22/2022 Target Resolution Date: 06/23/2022 Goal Status: Active Interventions: Assess patient/caregiver ability to obtain necessary supplies Assess patient/caregiver ability to perform ulcer/skin care regimen upon admission and as needed Assess ulceration(s) every visit Notes: Electronic Signature(s) Signed: 04/12/2022 4:43:19 PM By: Angelina Pih Entered By: Angelina Pih on 04/12/2022 16:16:11 -------------------------------------------------------------------------------- Pain Assessment Details Patient Name: Date of  Service: Lacey Seth Bake [name], Jackee [name]. 04/12/2022 3:45 PM Medical Record Number: [id number] Patient Account Number: [id number] Date of Birth/Sex: Treating RN: [birth year] (88 y.o. [name] Primary Care Haani Bakula: Marylynn Pearson Other Clinician: Betha Loa Referring Braylynn Ghan: Treating Tiberius Loftus/Extender: Lorna Dibble Weeks in Treatment: 7 Active Problems Location of Pain Severity and Description of Pain Patient Has Paino No Site Locations Rate the pain. LEOR, CASCELLA ([id number]) [id number] Page 5 of 7 Rate the pain. Current Pain Level: 0 Pain Management and Medication Current Pain Management: Electronic Signature(s) Signed: 04/12/2022 4:43:19 PM By: Angelina Pih Entered By: Angelina Pih on 04/12/2022 15:45:16 -------------------------------------------------------------------------------- Patient/Caregiver Education Details Patient Name: Date of Service: Lacey Burna Forts 4/8/2024andnbsp3:45 PM Medical Record Number: [id number] Patient Account Number: [id number] Date of Birth/Gender: Treating RN: Dec 08, 1934 (88 y.o. [name] Primary Care Physician: Marylynn Pearson Other Clinician: Referring Physician: Treating Physician/Extender: Germain Osgood in Treatment: 7 Education Assessment Education Provided To: Patient Education Topics Provided Wound Debridement: Handouts: Wound Debridement Methods: Explain/Verbal Responses: State content correctly Wound/Skin Impairment: Handouts: Caring for Your Ulcer Methods: Explain/Verbal Responses: State content correctly Electronic Signature(s) Signed: 04/12/2022 4:43:19 PM By: Angelina Pih Entered By: Angelina Pih on 04/12/2022 16:16:25 -------------------------------------------------------------------------------- Wound Assessment Details Patient Name: Date of Service: [name], [name]. 04/12/2022 3:45 PM Medical Record Number: [id number] Patient Account  Number: [id number] Date of Birth/Sex: Treating RN: 06/17/1934 (88 y.o. [name] Primary Care Finnley Lewis: Marylynn Pearson Other Clinician: Betha Loa Referring Ramonita Koenig: Treating Shawnte Demarest/Extender: Lorna Dibble Weeks in Treatment: 7 Wound Status Kassing, Sherida [name] ([id number]) [id number] Page 6 of 7 Wound Number: 1 Primary Pressure Ulcer Etiology: Wound Location: Left, Distal, Medial Foot Wound Open Wounding Event: Gradually Appeared Status: Date Acquired: 01/22/2022 Comorbid Chronic Obstructive Pulmonary Disease (COPD), Congestive Weeks Of Treatment: 7 History: Heart Failure, Dementia Clustered Wound: No Photos Wound Measurements Length: (cm) 0.7 Width: (cm) 0.4 Depth: (cm) 0.1 Area: (cm) 0.22 Volume: (cm) 0.022 % Reduction in Area: -209.9% % Reduction in Volume: -57.1% Epithelialization: Medium (34-66%) Tunneling: No Undermining: No Wound Description Classification: Category/Stage III Exudate Amount: Medium Exudate Type: Serosanguineous Exudate Color: red, brown Foul Odor After Cleansing: No Slough/Fibrino Yes Wound Bed Granulation Amount: Small (1-33%) Exposed Structure Granulation Quality: Pink Fascia Exposed: No Necrotic Amount: Large (67-100%) Fat Layer (Subcutaneous Tissue) Exposed: Yes Necrotic Quality: Adherent Slough Tendon Exposed: No Muscle Exposed: No Joint Exposed: No Bone Exposed: No Treatment Notes Wound #1 (Foot) Wound Laterality: Left, Medial, Distal Cleanser [employer]ram Ancillary Kit - 15 Day Supply Discharge Instruction: Use supplies as instructed; Kit contains: (15) Saline Bullets; (15) 3x3 Gauze; 15 pr Gloves Soap and Water Discharge Instruction: Gently cleanse wound with antibacterial soap, rinse and pat dry prior to dressing wounds Peri-Wound Care Topical Primary Dressing Endoform Natural, Non-fenestrated, 2x2 (in/in) Secondary Dressing (BORDER) Zetuvit Plus SILICONE BORDER Dressing 4x4  (  in/in) Discharge Instruction: Please do not put silicone bordered dressings under wraps. Use non-bordered dressing only. Secured With Compression Wrap Compression Stockings Add-Ons Electronic Signature(s) Signed: 04/12/2022 3:56:22 PM By: Angelina PihGordon, Caitlin Entered By: Angelina PihGordon, Caitlin on 04/12/2022 15:56:22 Medine, Tandi [name] ([id number]) [id number] Page 7 of 7 -------------------------------------------------------------------------------- Vitals Details Patient Name: Date of Service: Vedia PereyraDA V [name], [name]. 04/12/2022 3:45 PM Medical Record Number: [id number] Patient Account Number: [id number] Date of Birth/Sex: Treating RN: 02/29/1934 (88 y.o. Esmeralda LinksF) Gordon, Caitlin Primary Care Ladrea Holladay: Marylynn PearsonMcCorkle, Tenika Other Clinician: Betha LoaVenable, Angie Referring Phylicia Mcgaugh: Treating Deisy Ozbun/Extender: Lorna DibbleStone, Hoyt McCorkle, Tenika Weeks in Treatment: 7 Vital Signs Time Taken: 15:44 Temperature (F): 97.7 Height (in): 62 Pulse (bpm): 83 Weight (lbs): 105 Respiratory Rate (breaths/min): 18 Body Mass Index (BMI): 19.2 Blood Pressure (mmHg): 110/77 Reference Range: 80 - 120 mg / dl Electronic Signature(s) Signed: 04/12/2022 4:43:19 PM By: Angelina PihGordon, Caitlin Entered By: Angelina PihGordon, Caitlin on 04/12/2022 15:45:11

## 2022-04-12 NOTE — Progress Notes (Signed)
ELLOWYN, RIEVES ([id number]) [id number] Page 1 of 6 Visit Report for 04/12/2022 Chief Complaint Document Details Patient Name: Date of Service: [name] IS, [name] 04/12/2022 3:45 PM Medical Record Number: [id number] Patient Account Number: [id number] Date of Birth/Sex: Treating RN: 11-28-1934 (88 y.o. Skip Mayer Primary Care Provider: Marylynn Pearson Other Clinician: Betha Loa Referring Provider: Treating Provider/Extender: Lorna Dibble Weeks in Treatment: 7 Information Obtained from: Patient Chief Complaint Left foot pressure ulcer Electronic Signature(s) Signed: 04/12/2022 3:40:19 PM By: Lenda Kelp PA-C Entered By: Lenda Kelp on 04/12/2022 15:40:18 -------------------------------------------------------------------------------- Debridement Details Patient Name: Date of Service: [name], [name] 04/12/2022 3:45 PM Medical Record Number: [id number] Patient Account Number: [id number] Date of Birth/Sex: Treating RN: [birth year] (88 y.o. [name] Primary Care Provider: Marylynn Pearson Other Clinician: Betha Loa Referring Provider: Treating Provider/Extender: Lorna Dibble Weeks in Treatment: 7 Debridement Performed for Assessment: Wound #1 Left,Distal,Medial Foot Performed By: Physician Nelida Meuse., PA-C Debridement Type: Debridement Level of Consciousness (Pre-procedure): Awake and Alert Pre-procedure Verification/Time Out Yes - 16:00 Taken: Pain Control: Lidocaine 4% T opical Solution T Area Debrided (L x W): otal 0.7 (cm) x 0.4 (cm) = 0.28 (cm) Tissue and other material debrided: Viable, Non-Viable, Slough, Subcutaneous, Slough Level: Skin/Subcutaneous Tissue Debridement Description: Excisional Instrument: Curette Bleeding: Minimum Hemostasis Achieved: Pressure Response to Treatment: Procedure was tolerated well Level of Consciousness (Post- Awake and Alert procedure): Post  Debridement Measurements of Total Wound Length: (cm) 0.7 Stage: Category/Stage III Width: (cm) 0.4 Depth: (cm) 0.1 Volume: (cm) 0.022 Character of Wound/Ulcer Post Debridement: Stable Post Procedure Diagnosis Same as Pre-procedure Electronic Signature(s) Signed: 04/12/2022 4:43:19 PM By: Angelina Pih Entered By: Angelina Pih on 04/12/2022 16:01:17 -------------------------------------------------------------------------------- HPI Details Patient Name: Date of Service: [name], [name] 04/12/2022 3:45 PM Goodwill, Renford Dills ([id number]) [id number] Page 2 of 6 Medical Record Number: [id number] Patient Account Number: [id number] Date of Birth/Sex: Treating RN: [birth year] (88 y.o. Skip Mayer Primary Care Provider: Marylynn Pearson Other Clinician: Betha Loa Referring Provider: Treating Provider/Extender: Lorna Dibble Weeks in Treatment: 7 History of Present Illness HPI Description: 02-22-2022 upon evaluation today patient appears to be doing somewhat poorly in regard to her left medial first metatarsal head where she does have a bunion. Unfortunately this is causing some pressure and has led to a pressure injury at this site. Fortunately I do not see any signs of infection at this point but that is something we definitely can need to keep a close eye on. Fortunately there does not appear to be any signs of active infection locally nor systemically at this point. No fevers, chills, nausea, vomiting, or diarrhea. Patient does have a history of of vascular dementia, COPD, congestive heart failure, and the dementia does affect her ability to be able to in some cases follow directions. She is very nervous about things in general. This is stated to have started somewhere around January 22, 2022. 03-02-2022 upon evaluation today patient appears to be doing well currently in regard to her wounds all things considered. Fortunately I do not see any  signs of infection unfortunately both wounds are still open and the one on the medial foot first metatarsal location is still the worst. Although the area over the fifth toe right foot actually is also open at this point unfortunately. With that being said I do think we can need to continue to monitor for any signs of worsening or infection although right now I  think she is doing okay in that regard. 3/4; this is a patient with a bunion wound on the left medial foot. She also had a small blister on the right lateral fifth toe. She went for x-rays last week no acute fracture or dislocation was seen on the left side she had worsened moderate hallucis valgus and moderate second and third fourth fifth tarsometatarsal osteoarthritis. We use Prisma. 3/11; patient presents for follow-up. She has been using endoform to the left medial foot wound. She has cushion pads that she uses in between the toes to help offload the areas and prevent future wounds. The right lateral fifth met head remains closed. 03-22-2022 upon evaluation today patient appears to be doing well currently in regard to her wound. She does show some signs of improvement here. She is also showing some signs of being a little bit too moist I think we may want to cut back on the hydrogel possibly just using the saline only at this point she is in agreement with that plan. Fortunately I do not see any evidence of active infection locally nor systemically which is great news. No fevers, chills, nausea, vomiting, or diarrhea. 03-29-22 upon evaluation today patient actually appears to be making excellent progress and very pleased with how things appear and how the wound is doing. I do think that we are moving in the right direction and very pleased in that regard. No fevers, chills, nausea, vomiting, or diarrhea. 04-05-2022 upon evaluation today patient appears to be doing well currently in regard to her wound. This is actually showing signs of  improvement. Fortunately there does not appear to be any signs of active infection locally nor systemically at this time which is great news. No fevers, chills, nausea, vomiting, or diarrhea. 04-12-2022 upon evaluation today patient's wound actually showing signs of some improvement. Fortunately there does not appear to be any evidence of active infection locally nor systemically which is great news. No fevers, chills, nausea, vomiting, or diarrhea. Electronic Signature(s) Signed: 04/12/2022 4:48:39 PM By: Lenda KelpStone III, Chasidy Janak PA-C Entered By: Lenda KelpStone III, Moussa Wiegand on 04/12/2022 16:48:39 -------------------------------------------------------------------------------- Physical Exam Details Patient Name: Date of Service: [name], [name] 04/12/2022 3:45 PM Medical Record Number: [id number] Patient Account Number: [id number] Date of Birth/Sex: Treating RN: 12/18/1934 (88 y.o. Esmeralda LinksF) Gordon, Caitlin Primary Care Provider: Marylynn PearsonMcCorkle, Lacey Other Clinician: Referring Provider: Treating Provider/Extender: Lorna DibbleStone, Marypat Kimmet [name], Lacey Weeks in Treatment: 7 Constitutional Well-nourished and well-hydrated in no acute distress. Respiratory normal breathing without difficulty. Psychiatric this patient is able to make decisions and demonstrates good insight into disease process. Alert and Oriented x 3. pleasant and cooperative. Notes Patient's wound bed actually did show signs of the need for sharp debridement today. I performed debridement to clearway the necrotic debris which she tolerated without complication postdebridement the wound bed is better. This is still showing signs of improvement it is very slow but nonetheless I think we are seeing overall a track towards good healing. Electronic Signature(s) Signed: 04/12/2022 4:48:57 PM By: Lenda KelpStone III, Shey Yott PA-C Entered By: Lenda KelpStone III, Velia Pamer on 04/12/2022 16:48:57 Kusek, Lacey L ([id number]) [id number] Page 3 of  6 -------------------------------------------------------------------------------- Physician Orders Details Patient Name: Date of Service: [name], Cia L. 04/12/2022 3:45 PM Medical Record Number: [id number] Patient Account Number: [id number] Date of Birth/Sex: Treating RN: 05/29/1934 (88 y.o. Esmeralda LinksF) Gordon, Caitlin Primary Care Provider: Marylynn PearsonMcCorkle, Lacey Other Clinician: Betha LoaVenable, Angie Referring Provider: Treating Provider/Extender: Lorna DibbleStone, Cainen Burnham [name], Lacey Weeks in Treatment: 7 Verbal / Phone Orders: No  Diagnosis Coding ICD-10 Coding Code Description L89.893 Pressure ulcer of other site, stage 3 I50.42 Chronic combined systolic (congestive) and diastolic (congestive) heart failure F01.511 Vascular dementia, unspecified severity, with agitation J44.9 Chronic obstructive pulmonary disease, unspecified Follow-up Appointments Return Appointment in 1 week. Bathing/ [employer] wounds with antibacterial soap and water. Anesthetic (Use 'Patient Medications' Section for Anesthetic Order Entry) Lidocaine applied to wound bed Edema Control - Lymphedema / Segmental Compressive Device / Other Elevate, Exercise Daily and A void Standing for Long Periods of Time. Elevate legs to the level of the heart and pump ankles as often as possible Elevate leg(s) parallel to the floor when sitting. Wound Treatment Wound #1 - Foot Wound Laterality: Left, Medial, Distal Cleanser: Byram Ancillary Kit - 15 Day Supply (Generic) 3 x Per Week/30 Days Discharge Instructions: Use supplies as instructed; Kit contains: (15) Saline Bullets; (15) 3x3 Gauze; 15 pr Gloves Cleanser: Soap and Water 3 x Per Week/30 Days Discharge Instructions: Gently cleanse wound with antibacterial soap, rinse and pat dry prior to dressing wounds Prim Dressing: Endoform Natural, Non-fenestrated, 2x2 (in/in) (Dispense As Written) 3 x Per Week/30 Days ary Secondary Dressing: (BORDER) Zetuvit Plus SILICONE BORDER Dressing 4x4  (in/in) (Dispense As Written) 3 x Per Week/30 Days Discharge Instructions: Please do not put silicone bordered dressings under wraps. Use non-bordered dressing only. Electronic Signature(s) Signed: 04/12/2022 4:43:19 PM By: Angelina Pih Entered By: Angelina Pih on 04/12/2022 16:15:24 -------------------------------------------------------------------------------- Problem List Details Patient Name: Date of Service: [name], Jeydi L. 04/12/2022 3:45 PM Medical Record Number: [id number] Patient Account Number: [id number] Date of Birth/Sex: Treating RN: 1934-10-04 (88 y.o. Skip Mayer Primary Care Provider: Marylynn Pearson Other Clinician: Betha Loa Referring Provider: Treating Provider/Extender: Lorna Dibble Weeks in Treatment: 7 Active Problems ICD-10 Encounter Code Description Active Date MDM Diagnosis L89.893 Pressure ulcer of other site, stage 3 02/22/2022 No Yes Becraft, Lacey L ([id number]) [id number] Page 4 of 6 I50.42 Chronic combined systolic (congestive) and diastolic (congestive) heart failure 02/22/2022 No Yes F01.511 Vascular dementia, unspecified severity, with agitation 02/22/2022 No Yes J44.9 Chronic obstructive pulmonary disease, unspecified 02/22/2022 No Yes Inactive Problems Resolved Problems Electronic Signature(s) Signed: 04/12/2022 3:40:14 PM By: Lenda Kelp PA-C Entered By: Lenda Kelp on 04/12/2022 15:40:14 -------------------------------------------------------------------------------- Progress Note Details Patient Name: Date of Service: [name], [name] 04/12/2022 3:45 PM Medical Record Number: [id number] Patient Account Number: [id number] Date of Birth/Sex: Treating RN: 1934/02/21 (88 y.o. [name] Primary Care Provider: Marylynn Pearson Other Clinician: Referring Provider: Treating Provider/Extender: Lorna Dibble Weeks in Treatment: 7 Subjective Chief  Complaint Information obtained from Patient Left foot pressure ulcer History of Present Illness (HPI) 02-22-2022 upon evaluation today patient appears to be doing somewhat poorly in regard to her left medial first metatarsal head where she does have a bunion. Unfortunately this is causing some pressure and has led to a pressure injury at this site. Fortunately I do not see any signs of infection at this point but that is something we definitely can need to keep a close eye on. Fortunately there does not appear to be any signs of active infection locally nor systemically at this point. No fevers, chills, nausea, vomiting, or diarrhea. Patient does have a history of of vascular dementia, COPD, congestive heart failure, and the dementia does affect her ability to be able to in some cases follow directions. She is very nervous about things in general. This is stated to have started somewhere around January 22, 2022. 03-02-2022 upon evaluation today patient appears to be doing well currently in regard to her wounds all things considered. Fortunately I do not see any signs of infection unfortunately both wounds are still open and the one on the medial foot first metatarsal location is still the worst. Although the area over the fifth toe right foot actually is also open at this point unfortunately. With that being said I do think we can need to continue to monitor for any signs of worsening or infection although right now I think she is doing okay in that regard. 3/4; this is a patient with a bunion wound on the left medial foot. She also had a small blister on the right lateral fifth toe. She went for x-rays last week no acute fracture or dislocation was seen on the left side she had worsened moderate hallucis valgus and moderate second and third fourth fifth tarsometatarsal osteoarthritis. We use Prisma. 3/11; patient presents for follow-up. She has been using endoform to the left medial foot wound. She  has cushion pads that she uses in between the toes to help offload the areas and prevent future wounds. The right lateral fifth met head remains closed. 03-22-2022 upon evaluation today patient appears to be doing well currently in regard to her wound. She does show some signs of improvement here. She is also showing some signs of being a little bit too moist I think we may want to cut back on the hydrogel possibly just using the saline only at this point she is in agreement with that plan. Fortunately I do not see any evidence of active infection locally nor systemically which is great news. No fevers, chills, nausea, vomiting, or diarrhea. 03-29-22 upon evaluation today patient actually appears to be making excellent progress and very pleased with how things appear and how the wound is doing. I do think that we are moving in the right direction and very pleased in that regard. No fevers, chills, nausea, vomiting, or diarrhea. 04-05-2022 upon evaluation today patient appears to be doing well currently in regard to her wound. This is actually showing signs of improvement. Fortunately there does not appear to be any signs of active infection locally nor systemically at this time which is great news. No fevers, chills, nausea, vomiting, or diarrhea. 04-12-2022 upon evaluation today patient's wound actually showing signs of some improvement. Fortunately there does not appear to be any evidence of active infection locally nor systemically which is great news. No fevers, chills, nausea, vomiting, or diarrhea. ULDINE, FUSTER ([id number]) [id number] Page 5 of 6 Objective Constitutional Well-nourished and well-hydrated in no acute distress. Vitals Time Taken: 3:44 PM, Height: 62 in, Weight: 105 lbs, BMI: 19.2, Temperature: 97.7 F, Pulse: 83 bpm, Respiratory Rate: 18 breaths/min, Blood Pressure: 110/77 mmHg. Respiratory normal breathing without difficulty. Psychiatric this  patient is able to make decisions and demonstrates good insight into disease process. Alert and Oriented x 3. pleasant and cooperative. General Notes: Patient's wound bed actually did show signs of the need for sharp debridement today. I performed debridement to clearway the necrotic debris which she tolerated without complication postdebridement the wound bed is better. This is still showing signs of improvement it is very slow but nonetheless I think we are seeing overall a track towards good healing. Integumentary (Hair, Skin) Wound #1 status is Open. Original cause of wound was Gradually Appeared. The date acquired was: 01/22/2022. The wound has been in treatment 7 weeks. The wound is located on the Left,Distal,Medial  Foot. The wound measures 0.7cm length x 0.4cm width x 0.1cm depth; 0.22cm^2 area and 0.022cm^3 volume. There is Fat Layer (Subcutaneous Tissue) exposed. There is no tunneling or undermining noted. There is a medium amount of serosanguineous drainage noted. There is small (1-33%) pink granulation within the wound bed. There is a large (67-100%) amount of necrotic tissue within the wound bed including Adherent Slough. Assessment Active Problems ICD-10 Pressure ulcer of other site, stage 3 Chronic combined systolic (congestive) and diastolic (congestive) heart failure Vascular dementia, unspecified severity, with agitation Chronic obstructive pulmonary disease, unspecified Procedures Wound #1 Pre-procedure diagnosis of Wound #1 is a Pressure Ulcer located on the Left,Distal,Medial Foot . There was a Excisional Skin/Subcutaneous Tissue Debridement with a total area of 0.28 sq cm performed by Nelida Meuse., PA-C. With the following instrument(s): Curette to remove Viable and Non-Viable tissue/material. Material removed includes Subcutaneous Tissue and Slough and after achieving pain control using Lidocaine 4% T opical Solution. No specimens were taken. A time out was conducted at  16:00, prior to the start of the procedure. A Minimum amount of bleeding was controlled with Pressure. The procedure was tolerated well. Post Debridement Measurements: 0.7cm length x 0.4cm width x 0.1cm depth; 0.022cm^3 volume. Post debridement Stage noted as Category/Stage III. Character of Wound/Ulcer Post Debridement is stable. Post procedure Diagnosis Wound #1: Same as Pre-Procedure Plan Follow-up Appointments: Return Appointment in 1 week. Bathing/ Shower/ Hygiene: Wash wounds with antibacterial soap and water. Anesthetic (Use 'Patient Medications' Section for Anesthetic Order Entry): Lidocaine applied to wound bed Edema Control - Lymphedema / Segmental Compressive Device / Other: Elevate, Exercise Daily and Avoid Standing for Long Periods of Time. Elevate legs to the level of the heart and pump ankles as often as possible Elevate leg(s) parallel to the floor when sitting. WOUND #1: - Foot Wound Laterality: Left, Medial, Distal Cleanser: Byram Ancillary Kit - 15 Day Supply (Generic) 3 x Per Week/30 Days Discharge Instructions: Use supplies as instructed; Kit contains: (15) Saline Bullets; (15) 3x3 Gauze; 15 pr Gloves Cleanser: Soap and Water 3 x Per Week/30 Days Discharge Instructions: Gently cleanse wound with antibacterial soap, rinse and pat dry prior to dressing wounds Prim Dressing: Endoform Natural, Non-fenestrated, 2x2 (in/in) (Dispense As Written) 3 x Per Week/30 Days ary Secondary Dressing: (BORDER) Zetuvit Plus SILICONE BORDER Dressing 4x4 (in/in) (Dispense As Written) 3 x Per Week/30 Days Discharge Instructions: Please do not put silicone bordered dressings under wraps. Use non-bordered dressing only. ELON, WEITH ([id number]) [id number] Page 6 of 6 1. Based on what I am seeing I do believe that the patient is making some good progress here but unfortunately she still is not doing quite as well as but I would like to see. We will continue with  endoform and thinks this still has been of benefit for her she is in agreement with plan as is her daughter. 2. Would not continue with the lambswool between the toes which is also still doing quite well. 3. I am also can recommend that we continue with the bordered foam dressing to cover after applying endoform. We will see patient back for reevaluation in 1 week here in the clinic. If anything worsens or changes patient will contact our office for additional recommendations. Electronic Signature(s) Signed: 04/12/2022 4:49:20 PM By: Lenda Kelp PA-C Entered By: Lenda Kelp on 04/12/2022 16:49:20 -------------------------------------------------------------------------------- SuperBill Details Patient Name: Date of Service: [name], Lourine L. 04/12/2022 Medical Record Number: [id number] Patient Account Number: [id number] Date of  Birth/Sex: Treating RN: 09/08/1934 (88 y.o. [name] Primary Care Provider: Marylynn Pearson Other Clinician: Referring Provider: Treating Provider/Extender: Lorna Dibble Weeks in Treatment: 7 Diagnosis Coding ICD-10 Codes Code Description [phone] Pressure ulcer of other site, stage 3 I50.42 Chronic combined systolic (congestive) and diastolic (congestive) heart failure F01.511 Vascular dementia, unspecified severity, with agitation J44.9 Chronic obstructive pulmonary disease, unspecified Facility Procedures : CPT4 Code: [id number] Description: 11042 - DEB SUBQ TISSUE 20 SQ CM/< ICD-10 Diagnosis Description L89.893 Pressure ulcer of other site, stage 3 Modifier: Quantity: 1 Physician Procedures : CPT4 Code Description Modifier [id number] 11042 - WC PHYS SUBQ TISS 20 SQ CM ICD-10 Diagnosis Description L89.893 Pressure ulcer of other site, stage 3 Quantity: 1 Electronic Signature(s) Signed: 04/12/2022 4:49:31 PM By: Lenda Kelp PA-C Entered By: Lenda Kelp on 04/12/2022 16:49:31

## 2022-04-19 ENCOUNTER — Ambulatory Visit
Admission: RE | Admit: 2022-04-19 | Discharge: 2022-04-19 | Disposition: A | Discharge status: Home / Self Care | Payer: Medicare Other | Source: Ambulatory Visit | Attending: Specialist | Admitting: Specialist

## 2022-04-19 ENCOUNTER — Encounter: Payer: Medicare Other | Admitting: Physician Assistant

## 2022-04-19 DIAGNOSIS — R0602 Shortness of breath: Secondary | ICD-10-CM

## 2022-04-19 DIAGNOSIS — J849 Interstitial pulmonary disease, unspecified: Secondary | ICD-10-CM | POA: Insufficient documentation

## 2022-04-19 DIAGNOSIS — L89893 Pressure ulcer of other site, stage 3: Secondary | ICD-10-CM | POA: Diagnosis not present

## 2022-04-19 NOTE — Progress Notes (Signed)
ADILENE, AREOLA ([id number]) [id number] Page 1 of 6 Visit Report for 04/19/2022 Chief Complaint Document Details Patient Name: Date of Service: DA Seth Bake IS, Katheren L. 04/19/2022 12:00 PM Medical Record Number: [id number] Patient Account Number: [id number] Date of Birth/Sex: Treating RN: 02-26-1934 (88 y.o. [name] Primary Care Provider: Marylynn Pearson Other Clinician: Referring Provider: Treating Provider/Extender: Lorna Dibble Weeks in Treatment: 8 Information Obtained from: Patient Chief Complaint Left foot pressure ulcer Electronic Signature(s) Signed: 04/19/2022 12:27:12 PM By: Allen Derry PA-C Entered By: Allen Derry on 04/19/2022 12:27:12 -------------------------------------------------------------------------------- Debridement Details Patient Name: Date of Service: [name], Ciani L. 04/19/2022 12:00 PM Medical Record Number: [id number] Patient Account Number: [id number] Date of Birth/Sex: Treating RN: 10/10/1934 (88 y.o. [name] Primary Care Provider: Marylynn Pearson Other Clinician: Referring Provider: Treating Provider/Extender: Lorna Dibble Weeks in Treatment: 8 Debridement Performed for Assessment: Wound #1 Left,Distal,Medial Foot Performed By: Physician Allen Derry, PA-C Debridement Type: Debridement Level of Consciousness (Pre-procedure): Awake and Alert Pre-procedure Verification/Time Out Yes - 12:29 Taken: T Area Debrided (L x W): otal 0.5 (cm) x 0.3 (cm) = 0.15 (cm) Tissue and other material debrided: Viable, Non-Viable, Slough, Subcutaneous, Slough Level: Skin/Subcutaneous Tissue Debridement Description: Excisional Instrument: Curette Bleeding: Minimum Hemostasis Achieved: Pressure Response to Treatment: Procedure was tolerated well Level of Consciousness (Post- Awake and Alert procedure): Post Debridement Measurements of Total Wound Length: (cm) 0.5 Stage: Category/Stage  III Width: (cm) 0.3 Depth: (cm) 0.2 Volume: (cm) 0.024 Character of Wound/Ulcer Post Debridement: Stable Post Procedure Diagnosis Same as Pre-procedure Electronic Signature(s) Unsigned Entered By: Angelina Pih on 04/19/2022 12:30:30 HPI Details -------------------------------------------------------------------------------- [name] ([id number]) [id number] Page 2 of 6 Patient Name: Date of Service: DA Seth Bake IS, Keianna L. 04/19/2022 12:00 PM Medical Record Number: [id number] Patient Account Number: [id number] Date of Birth/Sex: Treating RN: [birth year] (88 y.o. [name] Primary Care Provider: Marylynn Pearson Other Clinician: Referring Provider: Treating Provider/Extender: Lorna Dibble Weeks in Treatment: 8 History of Present Illness HPI Description: 02-22-2022 upon evaluation today patient appears to be doing somewhat poorly in regard to her left medial first metatarsal head where she does have a bunion. Unfortunately this is causing some pressure and has led to a pressure injury at this site. Fortunately I do not see any signs of infection at this point but that is something we definitely can need to keep a close eye on. Fortunately there does not appear to be any signs of active infection locally nor systemically at this point. No fevers, chills, nausea, vomiting, or diarrhea. Patient does have a history of of vascular dementia, COPD, congestive heart failure, and the dementia does affect her ability to be able to in some cases follow directions. She is very nervous about things in general. This is stated to have started somewhere around January 22, 2022. 03-02-2022 upon evaluation today patient appears to be doing well currently in regard to her wounds all things considered. Fortunately I do not see any signs of infection unfortunately both wounds are still open and the one on the medial foot first metatarsal location is still the  worst. Although the area over the fifth toe right foot actually is also open at this point unfortunately. With that being said I do think we can need to continue to monitor for any signs of worsening or infection although right now I think she is doing okay in that regard. 3/4; this is a patient with a bunion wound on the left medial foot.  She also had a small blister on the right lateral fifth toe. She went for x-rays last week no acute fracture or dislocation was seen on the left side she had worsened moderate hallucis valgus and moderate second and third fourth fifth tarsometatarsal osteoarthritis. We use Prisma. 3/11; patient presents for follow-up. She has been using endoform to the left medial foot wound. She has cushion pads that she uses in between the toes to help offload the areas and prevent future wounds. The right lateral fifth met head remains closed. 03-22-2022 upon evaluation today patient appears to be doing well currently in regard to her wound. She does show some signs of improvement here. She is also showing some signs of being a little bit too moist I think we may want to cut back on the hydrogel possibly just using the saline only at this point she is in agreement with that plan. Fortunately I do not see any evidence of active infection locally nor systemically which is great news. No fevers, chills, nausea, vomiting, or diarrhea. 03-29-22 upon evaluation today patient actually appears to be making excellent progress and very pleased with how things appear and how the wound is doing. I do think that we are moving in the right direction and very pleased in that regard. No fevers, chills, nausea, vomiting, or diarrhea. 04-05-2022 upon evaluation today patient appears to be doing well currently in regard to her wound. This is actually showing signs of improvement. Fortunately there does not appear to be any signs of active infection locally nor systemically at this time which is great  news. No fevers, chills, nausea, vomiting, or diarrhea. 04-12-2022 upon evaluation today patient's wound actually showing signs of some improvement. Fortunately there does not appear to be any evidence of active infection locally nor systemically which is great news. No fevers, chills, nausea, vomiting, or diarrhea. 04-19-2022 upon evaluation today patient actually appears to be doing quite well in regard to her wound. She has been tolerating the dressing changes without complication. Fortunately there does not appear to be any signs of active infection locally or systemically which is great news. No fevers, chills, nausea, vomiting, or diarrhea. Electronic Signature(s) Signed: 04/19/2022 12:36:22 PM By: Allen Derry PA-C Entered By: Allen Derry on 04/19/2022 12:36:21 -------------------------------------------------------------------------------- Physical Exam Details Patient Name: Date of Service: [name], Catrice L. 04/19/2022 12:00 PM Medical Record Number: [id number] Patient Account Number: [id number] Date of Birth/Sex: Treating RN: Oct 06, 1934 (88 y.o. [name] Primary Care Provider: Marylynn Pearson Other Clinician: Referring Provider: Treating Provider/Extender: Lorna Dibble Weeks in Treatment: 8 Constitutional Well-nourished and well-hydrated in no acute distress. Respiratory normal breathing without difficulty. Psychiatric this patient is able to make decisions and demonstrates good insight into disease process. Alert and Oriented x 3. pleasant and cooperative. Notes Upon inspection patient's wound bed actually showed signs of good granulation position at this point. Fortunately I do not see any signs of worsening I think she did have a little bit of slough and biofilm on the surface of the wound I did perform debridement clearway the necrotic debris she tolerated that without complication this was a minimal debridement and overall I think she is making really  good progress here. Electronic Signature(s) Signed: 04/19/2022 12:36:44 PM By: Clydene Laming, Jaidan L ([id number]) [id number] Page 3 of 6 Signed: 04/19/2022 12:36:44 PM By: Allen Derry PA-C Entered By: Allen Derry on 04/19/2022 12:36:43 -------------------------------------------------------------------------------- Physician Orders Details Patient Name: Date of Service: [name], Hydeia L.  04/19/2022 12:00 PM Medical Record Number: [id number] Patient Account Number: [id number] Date of Birth/Sex: Treating RN: 18-Oct-1934 (88 y.o. [name] Primary Care Provider: Marylynn Pearson Other Clinician: Referring Provider: Treating Provider/Extender: Lorna Dibble Weeks in Treatment: 8 Verbal / Phone Orders: No Diagnosis Coding ICD-10 Coding Code Description [phone] Pressure ulcer of other site, stage 3 I50.42 Chronic combined systolic (congestive) and diastolic (congestive) heart failure F01.511 Vascular dementia, unspecified severity, with agitation J44.9 Chronic obstructive pulmonary disease, unspecified Follow-up Appointments Return Appointment in 1 week. Bathing/ [employer] wounds with antibacterial soap and water. Anesthetic (Use 'Patient Medications' Section for Anesthetic Order Entry) Lidocaine applied to wound bed Edema Control - Lymphedema / Segmental Compressive Device / Other Elevate, Exercise Daily and A void Standing for Long Periods of Time. Elevate legs to the level of the heart and pump ankles as often as possible Elevate leg(s) parallel to the floor when sitting. Wound Treatment Wound #1 - Foot Wound Laterality: Left, Medial, Distal Cleanser: Byram Ancillary Kit - 15 Day Supply (Generic) 3 x Per Week/30 Days Discharge Instructions: Use supplies as instructed; Kit contains: (15) Saline Bullets; (15) 3x3 Gauze; 15 pr Gloves Cleanser: Soap and Water 3 x Per Week/30 Days Discharge Instructions: Gently  cleanse wound with antibacterial soap, rinse and pat dry prior to dressing wounds Prim Dressing: Endoform Natural, Non-fenestrated, 2x2 (in/in) (Dispense As Written) 3 x Per Week/30 Days ary Secondary Dressing: (BORDER) Zetuvit Plus SILICONE BORDER Dressing 4x4 (in/in) (Dispense As Written) 3 x Per Week/30 Days Discharge Instructions: Please do not put silicone bordered dressings under wraps. Use non-bordered dressing only. Electronic Signature(s) Unsigned Entered By: Angelina Pih on 04/19/2022 12:36:20 -------------------------------------------------------------------------------- Problem List Details Patient Name: Date of Service: [name], Gearldene L. 04/19/2022 12:00 PM Medical Record Number: [id number] Patient Account Number: [id number] Date of Birth/Sex: Treating RN: 03-Apr-1934 (88 y.o. [name] Primary Care Provider: Marylynn Pearson Other Clinician: Referring Provider: Treating Provider/Extender: Lorna Dibble Weeks in Treatment: 8 Active Problems Goranson, Jaynie L ([id number]) [id number] Page 4 of 6 ICD-10 Encounter Code Description Active Date MDM Diagnosis L89.893 Pressure ulcer of other site, stage 3 02/22/2022 No Yes I50.42 Chronic combined systolic (congestive) and diastolic (congestive) heart failure 02/22/2022 No Yes F01.511 Vascular dementia, unspecified severity, with agitation 02/22/2022 No Yes J44.9 Chronic obstructive pulmonary disease, unspecified 02/22/2022 No Yes Inactive Problems Resolved Problems Electronic Signature(s) Signed: 04/19/2022 12:27:09 PM By: Allen Derry PA-C Entered By: Allen Derry on 04/19/2022 12:27:08 -------------------------------------------------------------------------------- Progress Note Details Patient Name: Date of Service: [name], Annisa L. 04/19/2022 12:00 PM Medical Record Number: [id number] Patient Account Number: [id number] Date of Birth/Sex: Treating RN: 29-Jan-1934 (88 y.o. [name] Primary Care Provider: Marylynn Pearson Other Clinician: Referring Provider: Treating Provider/Extender: Lorna Dibble Weeks in Treatment: 8 Subjective Chief Complaint Information obtained from Patient Left foot pressure ulcer History of Present Illness (HPI) 02-22-2022 upon evaluation today patient appears to be doing somewhat poorly in regard to her left medial first metatarsal head where she does have a bunion. Unfortunately this is causing some pressure and has led to a pressure injury at this site. Fortunately I do not see any signs of infection at this point but that is something we definitely can need to keep a close eye on. Fortunately there does not appear to be any signs of active infection locally nor systemically at this point. No fevers, chills, nausea, vomiting, or diarrhea. Patient does have a history of of vascular dementia, COPD, congestive  heart failure, and the dementia does affect her ability to be able to in some cases follow directions. She is very nervous about things in general. This is stated to have started somewhere around January 22, 2022. 03-02-2022 upon evaluation today patient appears to be doing well currently in regard to her wounds all things considered. Fortunately I do not see any signs of infection unfortunately both wounds are still open and the one on the medial foot first metatarsal location is still the worst. Although the area over the fifth toe right foot actually is also open at this point unfortunately. With that being said I do think we can need to continue to monitor for any signs of worsening or infection although right now I think she is doing okay in that regard. 3/4; this is a patient with a bunion wound on the left medial foot. She also had a small blister on the right lateral fifth toe. She went for x-rays last week no acute fracture or dislocation was seen on the left side she had worsened moderate hallucis  valgus and moderate second and third fourth fifth tarsometatarsal osteoarthritis. We use Prisma. 3/11; patient presents for follow-up. She has been using endoform to the left medial foot wound. She has cushion pads that she uses in between the toes to help offload the areas and prevent future wounds. The right lateral fifth met head remains closed. 03-22-2022 upon evaluation today patient appears to be doing well currently in regard to her wound. She does show some signs of improvement here. She is also showing some signs of being a little bit too moist I think we may want to cut back on the hydrogel possibly just using the saline only at this point she is in agreement with that plan. Fortunately I do not see any evidence of active infection locally nor systemically which is great news. No fevers, chills, nausea, vomiting, or diarrhea. 03-29-22 upon evaluation today patient actually appears to be making excellent progress and very pleased with how things appear and how the wound is doing. I do think that we are moving in the right direction and very pleased in that regard. No fevers, chills, nausea, vomiting, or diarrhea. 04-05-2022 upon evaluation today patient appears to be doing well currently in regard to her wound. This is actually showing signs of improvement. Fortunately there does not appear to be any signs of active infection locally nor systemically at this time which is great news. No fevers, chills, nausea, vomiting, or diarrhea. 04-12-2022 upon evaluation today patient's wound actually showing signs of some improvement. Fortunately there does not appear to be any evidence of active infection locally nor systemically which is great news. No fevers, chills, nausea, vomiting, or diarrhea. MONROE, TOURE ([id number]) [id number] Page 5 of 6 04-19-2022 upon evaluation today patient actually appears to be doing quite well in regard to her wound. She has been tolerating the  dressing changes without complication. Fortunately there does not appear to be any signs of active infection locally or systemically which is great news. No fevers, chills, nausea, vomiting, or diarrhea. Objective Constitutional Well-nourished and well-hydrated in no acute distress. Vitals Time Taken: 12:02 PM, Height: 62 in, Weight: 105 lbs, BMI: 19.2, Temperature: 97.8 F, Pulse: 80 bpm, Respiratory Rate: 18 breaths/min, Blood Pressure: 123/78 mmHg. Respiratory normal breathing without difficulty. Psychiatric this patient is able to make decisions and demonstrates good insight into disease process. Alert and Oriented x 3. pleasant and cooperative. General Notes: Upon inspection patient's wound  bed actually showed signs of good granulation position at this point. Fortunately I do not see any signs of worsening I think she did have a little bit of slough and biofilm on the surface of the wound I did perform debridement clearway the necrotic debris she tolerated that without complication this was a minimal debridement and overall I think she is making really good progress here. Integumentary (Hair, Skin) Wound #1 status is Open. Original cause of wound was Gradually Appeared. The date acquired was: 01/22/2022. The wound has been in treatment 8 weeks. The wound is located on the Left,Distal,Medial Foot. The wound measures 0.5cm length x 0.3cm width x 0.2cm depth; 0.118cm^2 area and 0.024cm^3 volume. There is Fat Layer (Subcutaneous Tissue) exposed. There is no tunneling or undermining noted. There is a medium amount of serosanguineous drainage noted. There is small (1-33%) pink, pale granulation within the wound bed. There is a large (67-100%) amount of necrotic tissue within the wound bed including Adherent Slough. Assessment Active Problems ICD-10 Pressure ulcer of other site, stage 3 Chronic combined systolic (congestive) and diastolic (congestive) heart failure Vascular dementia,  unspecified severity, with agitation Chronic obstructive pulmonary disease, unspecified Procedures Wound #1 Pre-procedure diagnosis of Wound #1 is a Pressure Ulcer located on the Left,Distal,Medial Foot . There was a Excisional Skin/Subcutaneous Tissue Debridement with a total area of 0.15 sq cm performed by Allen Derry, PA-C. With the following instrument(s): Curette to remove Viable and Non-Viable tissue/material. Material removed includes Subcutaneous Tissue and Slough and. No specimens were taken. A time out was conducted at 12:29, prior to the start of the procedure. A Minimum amount of bleeding was controlled with Pressure. The procedure was tolerated well. Post Debridement Measurements: 0.5cm length x 0.3cm width x 0.2cm depth; 0.024cm^3 volume. Post debridement Stage noted as Category/Stage III. Character of Wound/Ulcer Post Debridement is stable. Post procedure Diagnosis Wound #1: Same as Pre-Procedure Plan Follow-up Appointments: Return Appointment in 1 week. Bathing/ Shower/ Hygiene: Wash wounds with antibacterial soap and water. Anesthetic (Use 'Patient Medications' Section for Anesthetic Order Entry): Lidocaine applied to wound bed Edema Control - Lymphedema / Segmental Compressive Device / Other: Elevate, Exercise Daily and Avoid Standing for Long Periods of Time. Elevate legs to the level of the heart and pump ankles as often as possible Elevate leg(s) parallel to the floor when sitting. WOUND #1: - Foot Wound Laterality: Left, Medial, Distal Bobeck, Mykeisha L ([id number]) [id number] Page 6 of 6 Cleanser: Byram Ancillary Kit - 15 Day Supply (Generic) 3 x Per Week/30 Days Discharge Instructions: Use supplies as instructed; Kit contains: (15) Saline Bullets; (15) 3x3 Gauze; 15 pr Gloves Cleanser: Soap and Water 3 x Per Week/30 Days Discharge Instructions: Gently cleanse wound with antibacterial soap, rinse and pat dry prior to dressing wounds Prim  Dressing: Endoform Natural, Non-fenestrated, 2x2 (in/in) (Dispense As Written) 3 x Per Week/30 Days ary Secondary Dressing: (BORDER) Zetuvit Plus SILICONE BORDER Dressing 4x4 (in/in) (Dispense As Written) 3 x Per Week/30 Days Discharge Instructions: Please do not put silicone bordered dressings under wraps. Use non-bordered dressing only. 1. I am going to suggest that we have the patient continue to monitor for any signs of worsening or infection. I do believe that this is all headed in the right direction which is great news. 2. I am also can recommend that the patient should continue with the endoform which I think is really doing a good job. 3. I am also going to suggest that she should continue to keep pressure off  of the area in question. Honestly I feel like she is doing a pretty good job of this using the current shoe. We will see patient back for reevaluation in 1 week here in the clinic. If anything worsens or changes patient will contact our office for additional recommendations. Electronic Signature(s) Signed: 04/19/2022 12:37:34 PM By: Allen Derry PA-C Entered By: Allen Derry on 04/19/2022 12:37:33 -------------------------------------------------------------------------------- SuperBill Details Patient Name: Date of Service: [name], Julitza L. 04/19/2022 Medical Record Number: [id number] Patient Account Number: [id number] Date of Birth/Sex: Treating RN: [birth year] (88 y.o. [name] Primary Care Provider: Marylynn Pearson Other Clinician: Referring Provider: Treating Provider/Extender: Lorna Dibble Weeks in Treatment: 8 Diagnosis Coding ICD-10 Codes Code Description [phone] Pressure ulcer of other site, stage 3 I50.42 Chronic combined systolic (congestive) and diastolic (congestive) heart failure F01.511 Vascular dementia, unspecified severity, with agitation J44.9 Chronic obstructive pulmonary disease, unspecified Facility Procedures : CPT4 Code:  [id number] Description: 11042 - DEB SUBQ TISSUE 20 SQ CM/< ICD-10 Diagnosis Description L89.893 Pressure ulcer of other site, stage 3 Modifier: Quantity: 1 Physician Procedures : CPT4 Code Description Modifier [id number] 11042 - WC PHYS SUBQ TISS 20 SQ CM ICD-10 Diagnosis Description L89.893 Pressure ulcer of other site, stage 3 Quantity: 1 Electronic Signature(s) Unsigned Previous Signature: 04/19/2022 12:37:42 PM Version By: Allen Derry PA-C Entered By: Angelina Pih on 04/19/2022 12:40:24 Signature(s): Date(s):

## 2022-04-20 NOTE — Progress Notes (Signed)
[name], [name] ([id number]) [id number] Page 1 of 7 Visit Report for 04/19/2022 Arrival Information Details Patient Name: Date of Service: DA Seth Bake IS, Zitlali [name]. 04/19/2022 12:00 PM Medical Record Number: [id number] Patient Account Number: [id number] Date of Birth/Sex: Treating RN: 12/03/1934 (88 y.o. [name] Primary Care Cougar Imel: Marylynn Pearson Other Clinician: Referring Cache Bills: Treating Takeru Bose/Extender: Lorna Dibble Weeks in Treatment: 8 Visit Information History Since Last Visit Added or deleted any medications: No Patient Arrived: Wheel Chair Any new allergies or adverse reactions: No Arrival Time: 12:02 Had a fall or experienced change in No Accompanied By: daughter activities of daily living that may affect Transfer Assistance: EasyPivot Patient Lift risk of falls: Patient Identification Verified: Yes Hospitalized since last visit: No Secondary Verification Process Completed: Yes Has Dressing in Place as Prescribed: Yes Patient Requires Transmission-Based Precautions: No Pain Present Now: No Patient Has Alerts: No Electronic Signature(s) Signed: 04/19/2022 4:33:05 PM By: Angelina Pih Entered By: Angelina Pih on 04/19/2022 12:02:40 -------------------------------------------------------------------------------- Clinic Level of Care Assessment Details Patient Name: Date of Service: [name], [name]. 04/19/2022 12:00 PM Medical Record Number: [id number] Patient Account Number: [id number] Date of Birth/Sex: Treating RN: [birth year] (88 y.o. [name] Primary Care Shahrzad Koble: Marylynn Pearson Other Clinician: Referring Salif Tay: Treating Tykesha Konicki/Extender: Lorna Dibble Weeks in Treatment: 8 Clinic Level of Care Assessment Items TOOL 1 Quantity Score []  - 0 Use when EandM and Procedure is performed on INITIAL visit ASSESSMENTS - Nursing Assessment / Reassessment []  - 0 General Physical Exam  (combine w/ comprehensive assessment (listed just below) when performed on new pt. evals) []  - 0 Comprehensive Assessment (HX, ROS, Risk Assessments, Wounds Hx, etc.) ASSESSMENTS - Wound and Skin Assessment / Reassessment []  - 0 Dermatologic / Skin Assessment (not related to wound area) ASSESSMENTS - Ostomy and/or Continence Assessment and Care []  - 0 Incontinence Assessment and Management []  - 0 Ostomy Care Assessment and Management (repouching, etc.) PROCESS - Coordination of Care []  - 0 Simple Patient / Family Education for ongoing care []  - 0 Complex (extensive) Patient / Family Education for ongoing care []  - 0 Staff obtains Chiropractor, Records, T Results / Process Orders est []  - 0 Staff telephones HHA, Nursing Homes / Clarify orders / etc []  - 0 Routine Transfer to another Facility (non-emergent condition) []  - 0 Routine Hospital Admission (non-emergent condition) []  - 0 New Admissions / Insurance Authorizations / Ordering NPWT Apligraf, etc. , []  - 0 Emergency Hospital Admission (emergent condition) PROCESS - Special Needs [name], [name] ([id number]) [id number] Page 2 of 7 []  - 0 Pediatric / Minor Patient Management []  - 0 Isolation Patient Management []  - 0 Hearing / Language / Visual special needs []  - 0 Assessment of Community assistance (transportation, D/C planning, etc.) []  - 0 Additional assistance / Altered mentation []  - 0 Support Surface(s) Assessment (bed, cushion, seat, etc.) INTERVENTIONS - Miscellaneous []  - 0 External ear exam []  - 0 Patient Transfer (multiple staff / Nurse, adult / Similar devices) []  - 0 Simple Staple / Suture removal (25 or less) []  - 0 Complex Staple / Suture removal (26 or more) []  - 0 Hypo/Hyperglycemic Management (do not check if billed separately) []  - 0 Ankle / Brachial Index (ABI) - do not check if billed separately Has the patient been seen at the hospital within the last three years:  Yes Total Score: 0 Level Of Care: ____ Electronic Signature(s) Signed: 04/19/2022 4:33:05 PM By: Angelina Pih Entered By: Angelina Pih on 04/19/2022  12:40:15 -------------------------------------------------------------------------------- Encounter Discharge Information Details Patient Name: Date of Service: [name], [name]. 04/19/2022 12:00 PM Medical Record Number: [id number] Patient Account Number: [id number] Date of Birth/Sex: Treating RN: January 31, 1934 (88 y.o. [name] Primary Care Ceola Para: Marylynn Pearson Other Clinician: Referring Denvil Canning: Treating Jorja Empie/Extender: Lorna Dibble Weeks in Treatment: 8 Encounter Discharge Information Items Post Procedure Vitals Discharge Condition: Stable Temperature (F): 97.8 Ambulatory Status: Wheelchair Pulse (bpm): 80 Discharge Destination: Home Respiratory Rate (breaths/min): 18 Transportation: Private Auto Blood Pressure (mmHg): 123/78 Accompanied By: family Schedule Follow-up Appointment: Yes Clinical Summary of Care: Electronic Signature(s) Signed: 04/19/2022 4:33:05 PM By: Angelina Pih Entered By: Angelina Pih on 04/19/2022 12:43:45 -------------------------------------------------------------------------------- Lower Extremity Assessment Details Patient Name: Date of Service: [name], [name]. 04/19/2022 12:00 PM Medical Record Number: [id number] Patient Account Number: [id number] Date of Birth/Sex: Treating RN: [birth year] (88 y.o. [name] Primary Care Maeli Spacek: Marylynn Pearson Other Clinician: Referring Kregg Cihlar: Treating Denyse Fillion/Extender: Lorna Dibble Weeks in Treatment: 8 Edema Assessment Assessed: [Left: No] [Right: No] Edema: [Left: N] [Right: o] Calf Elliff, Jailene [name] ([id number]) [id number] Page 3 of 7 Left: Right: Point of Measurement: 23 cm From Medial Instep 28.2 cm Ankle Left: Right: Point of Measurement: 11 cm From  Medial Instep 19.2 cm Vascular Assessment Pulses: Dorsalis Pedis Doppler Audible: [Left:Yes] Posterior Tibial Doppler Audible: [Left:Yes] Electronic Signature(s) Signed: 04/19/2022 4:33:05 PM By: Angelina Pih Entered By: Angelina Pih on 04/19/2022 12:12:11 -------------------------------------------------------------------------------- Multi Wound Chart Details Patient Name: Date of Service: [name], [name]. 04/19/2022 12:00 PM Medical Record Number: [id number] Patient Account Number: [id number] Date of Birth/Sex: Treating RN: [birth year] (88 y.o. [name] Primary Care Navil Kole: Marylynn Pearson Other Clinician: Referring Cheryl Stabenow: Treating Harlan Vinal/Extender: Lorna Dibble Weeks in Treatment: 8 Vital Signs Height(in): 62 Pulse(bpm): 80 Weight(lbs): 105 Blood Pressure(mmHg): 123/78 Body Mass Index(BMI): 19.2 Temperature(F): 97.8 Respiratory Rate(breaths/min): 18 [1:Photos:] [N/A:N/A] Left, Distal, Medial Foot N/A N/A Wound Location: Gradually Appeared N/A N/A Wounding Event: Pressure Ulcer N/A N/A Primary Etiology: Chronic Obstructive Pulmonary N/A N/A Comorbid History: Disease (COPD), Congestive Heart Failure, Dementia 01/22/2022 N/A N/A Date Acquired: 8 N/A N/A Weeks of Treatment: Open N/A N/A Wound Status: No N/A N/A Wound Recurrence: 0.5x0.3x0.2 N/A N/A Measurements [name] x W x D (cm) 0.118 N/A N/A A (cm) : rea 0.024 N/A N/A Volume (cm) : -66.20% N/A N/A % Reduction in A rea: -71.40% N/A N/A % Reduction in Volume: Category/Stage III N/A N/A Classification: Medium N/A N/A Exudate A mount: Serosanguineous N/A N/A Exudate Type: red, brown N/A N/A Exudate Color: Small (1-33%) N/A N/A Granulation A mount: Pink, Pale N/A N/A Granulation Quality: Large (67-100%) N/A N/A Necrotic A mount: Fat Layer (Subcutaneous Tissue): Yes N/A N/A Exposed Structures: Fascia: No Tendon: No Muscle: No Joint: No [name], [name]  ([id number]) [id number] Page 4 of 7 Bone: No Medium (34-66%) N/A N/A Epithelialization: Debridement - Excisional N/A N/A Debridement: 12:29 N/A N/A Pre-procedure Verification/Time Out Taken: Subcutaneous, Slough N/A N/A Tissue Debrided: Skin/Subcutaneous Tissue N/A N/A Level: 0.15 N/A N/A Debridement A (sq cm): rea Curette N/A N/A Instrument: Minimum N/A N/A Bleeding: Pressure N/A N/A Hemostasis A chieved: Procedure was tolerated well N/A N/A Debridement Treatment Response: 0.5x0.3x0.2 N/A N/A Post Debridement Measurements [name] x W x D (cm) 0.024 N/A N/A Post Debridement Volume: (cm) Category/Stage III N/A N/A Post Debridement Stage: Debridement N/A N/A Procedures Performed: Treatment Notes Wound #1 (Foot) Wound Laterality: Left, Medial, Distal Cleanser Byram Ancillary Kit - 15 Day Supply  Discharge Instruction: Use supplies as instructed; Kit contains: (15) Saline Bullets; (15) 3x3 Gauze; 15 pr Gloves Soap and Water Discharge Instruction: Gently cleanse wound with antibacterial soap, rinse and pat dry prior to dressing wounds Peri-Wound Care Topical Primary Dressing Endoform Natural, Non-fenestrated, 2x2 (in/in) Secondary Dressing (BORDER) Zetuvit Plus SILICONE BORDER Dressing 4x4 (in/in) Discharge Instruction: Please do not put silicone bordered dressings under wraps. Use non-bordered dressing only. Secured With Compression Wrap Compression Stockings Add-Ons Electronic Signature(s) Signed: 04/19/2022 4:33:05 PM By: Angelina Pih Entered By: Angelina Pih on 04/19/2022 13:34:57 -------------------------------------------------------------------------------- Multi-Disciplinary Care Plan Details Patient Name: Date of Service: [name], [name]. 04/19/2022 12:00 PM Medical Record Number: [id number] Patient Account Number: [id number] Date of Birth/Sex: Treating RN: 1934-09-22 (88 y.o. [name] Primary Care Ladale Sherburn: Marylynn Pearson Other Clinician: Referring Keyonna Comunale: Treating Trevante Tennell/Extender: Lorna Dibble Weeks in Treatment: 8 Active Inactive Wound/Skin Impairment Nursing Diagnoses: Knowledge deficit related to ulceration/compromised skin integrity Goals: Patient/caregiver will verbalize understanding of skin care regimen Date Initiated: 02/22/2022 Date Inactivated: 04/19/2022 Target Resolution Date: 04/23/2022 Goal Status: Met Ulcer/skin breakdown will have a volume reduction of 30% by week 4 [name], [name] ([id number]) [id number] Page 5 of 7 Date Initiated: 02/22/2022 Date Inactivated: 04/05/2022 Target Resolution Date: 03/23/2022 Goal Status: Unmet Unmet Reason: comorbities Ulcer/skin breakdown will have a volume reduction of 50% by week 8 Date Initiated: 02/22/2022 Date Inactivated: 04/19/2022 Target Resolution Date: 04/19/2022 Unmet Reason: underlying conditions, Goal Status: Unmet wound improving Ulcer/skin breakdown will have a volume reduction of 80% by week 12 Date Initiated: 02/22/2022 Target Resolution Date: 05/23/2022 Goal Status: Active Ulcer/skin breakdown will heal within 14 weeks Date Initiated: 02/22/2022 Target Resolution Date: 06/23/2022 Goal Status: Active Interventions: Assess patient/caregiver ability to obtain necessary supplies Assess patient/caregiver ability to perform ulcer/skin care regimen upon admission and as needed Assess ulceration(s) every visit Notes: Electronic Signature(s) Signed: 04/19/2022 4:33:05 PM By: Angelina Pih Entered By: Angelina Pih on 04/19/2022 12:42:42 -------------------------------------------------------------------------------- Pain Assessment Details Patient Name: Date of Service: [name], [name]. 04/19/2022 12:00 PM Medical Record Number: [id number] Patient Account Number: [id number] Date of Birth/Sex: Treating RN: 1934-10-29 (88 y.o. [name] Primary Care Coutney Wildermuth: Marylynn Pearson  Other Clinician: Referring Elianny Buxbaum: Treating Aasir Daigler/Extender: Lorna Dibble Weeks in Treatment: 8 Active Problems Location of Pain Severity and Description of Pain Patient Has Paino No Site Locations Rate the pain. Current Pain Level: 0 Pain Management and Medication Current Pain Management: Electronic Signature(s) Signed: 04/19/2022 4:33:05 PM By: Angelina Pih Entered By: Angelina Pih on 04/19/2022 12:03:01 -------------------------------------------------------------------------------- Patient/Caregiver Education Details Patient Name: Date of Service: [name], [name] 4/15/2024andnbsp12:00 PM Medical Record Number: [id number] Patient Account Number: [id number] AARIAN, CLEAVER ([id number]) [id number] Page 6 of 7 Date of Birth/Gender: Treating RN: 1934/03/10 (88 y.o. [name] Primary Care Physician: Marylynn Pearson Other Clinician: Referring Physician: Treating Physician/Extender: Germain Osgood in Treatment: 8 Education Assessment Education Provided To: Patient and Caregiver Education Topics Provided Wound Debridement: Handouts: Wound Debridement Methods: Explain/Verbal Responses: State content correctly Wound/Skin Impairment: Handouts: Caring for Your Ulcer Methods: Explain/Verbal Responses: State content correctly Electronic Signature(s) Signed: 04/19/2022 4:33:05 PM By: Angelina Pih Entered By: Angelina Pih on 04/19/2022 12:43:06 -------------------------------------------------------------------------------- Wound Assessment Details Patient Name: Date of Service: [name], [name]. 04/19/2022 12:00 PM Medical Record Number: [id number] Patient Account Number: [id number] Date of Birth/Sex: Treating RN: [birth year] (88 y.o. [name] Primary Care Danni Leabo: Marylynn Pearson Other Clinician: Referring Avis Mcmahill: Treating Nyeem Stoke/Extender: Allen Derry  Marylynn Pearson Weeks in  Treatment: 8 Wound Status Wound Number: 1 Primary Pressure Ulcer Etiology: Wound Location: Left, Distal, Medial Foot Wound Open Wounding Event: Gradually Appeared Status: Date Acquired: 01/22/2022 Comorbid Chronic Obstructive Pulmonary Disease (COPD), Congestive Weeks Of Treatment: 8 History: Heart Failure, Dementia Clustered Wound: No Photos Wound Measurements Length: (cm) 0.5 Width: (cm) 0.3 Depth: (cm) 0.2 Area: (cm) 0.118 Volume: (cm) 0.024 % Reduction in Area: -66.2% % Reduction in Volume: -71.4% Epithelialization: Medium (34-66%) Tunneling: No Undermining: No Wound Description Classification: Category/Stage III Exudate Amount: Medium Exudate Type: Serosanguineous Exudate Color: red, brown [name], [name] ([id number]) Foul Odor After Cleansing: No Slough/Fibrino Yes [id number] Page 7 of 7 Wound Bed Granulation Amount: Small (1-33%) Exposed Structure Granulation Quality: Pink, Pale Fascia Exposed: No Necrotic Amount: Large (67-100%) Fat Layer (Subcutaneous Tissue) Exposed: Yes Necrotic Quality: Adherent Slough Tendon Exposed: No Muscle Exposed: No Joint Exposed: No Bone Exposed: No Treatment Notes Wound #1 (Foot) Wound Laterality: Left, Medial, Distal Cleanser Byram Ancillary Kit - 15 Day Supply Discharge Instruction: Use supplies as instructed; Kit contains: (15) Saline Bullets; (15) 3x3 Gauze; 15 pr Gloves Soap and Water Discharge Instruction: Gently cleanse wound with antibacterial soap, rinse and pat dry prior to dressing wounds Peri-Wound Care Topical Primary Dressing Endoform Natural, Non-fenestrated, 2x2 (in/in) Secondary Dressing (BORDER) Zetuvit Plus SILICONE BORDER Dressing 4x4 (in/in) Discharge Instruction: Please do not put silicone bordered dressings under wraps. Use non-bordered dressing only. Secured With Compression Wrap Compression Stockings Add-Ons Electronic Signature(s) Signed: 04/19/2022 4:33:05 PM By:  Angelina Pih Entered By: Angelina Pih on 04/19/2022 12:11:45 -------------------------------------------------------------------------------- Vitals Details Patient Name: Date of Service: [name], [name]. 04/19/2022 12:00 PM Medical Record Number: [id number] Patient Account Number: [id number] Date of Birth/Sex: Treating RN: Nov 29, 1934 (88 y.o. [name] Primary Care Nolene Rocks: Marylynn Pearson Other Clinician: Referring Berna Gitto: Treating Senai Ramnath/Extender: Lorna Dibble Weeks in Treatment: 8 Vital Signs Time Taken: 12:02 Temperature (F): 97.8 Height (in): 62 Pulse (bpm): 80 Weight (lbs): 105 Respiratory Rate (breaths/min): 18 Body Mass Index (BMI): 19.2 Blood Pressure (mmHg): 123/78 Reference Range: 80 - 120 mg / dl Electronic Signature(s) Signed: 04/19/2022 4:33:05 PM By: Angelina Pih Entered By: Angelina Pih on 04/19/2022 12:02:55

## 2022-04-23 ENCOUNTER — Emergency Department (HOSPITAL_COMMUNITY): Payer: Medicare Other

## 2022-04-23 ENCOUNTER — Inpatient Hospital Stay (HOSPITAL_COMMUNITY)
Admission: EM | Admit: 2022-04-23 | Discharge: 2022-04-30 | DRG: 193 | Disposition: A | Discharge status: Home Health | Payer: Medicare Other | Attending: Family Medicine | Admitting: Family Medicine

## 2022-04-23 ENCOUNTER — Other Ambulatory Visit: Payer: Self-pay

## 2022-04-23 ENCOUNTER — Encounter (HOSPITAL_COMMUNITY): Payer: Self-pay | Admitting: Emergency Medicine

## 2022-04-23 DIAGNOSIS — Z96642 Presence of left artificial hip joint: Secondary | ICD-10-CM | POA: Diagnosis present

## 2022-04-23 DIAGNOSIS — Y92239 Unspecified place in hospital as the place of occurrence of the external cause: Secondary | ICD-10-CM | POA: Diagnosis not present

## 2022-04-23 DIAGNOSIS — Z833 Family history of diabetes mellitus: Secondary | ICD-10-CM

## 2022-04-23 DIAGNOSIS — E785 Hyperlipidemia, unspecified: Secondary | ICD-10-CM | POA: Diagnosis present

## 2022-04-23 DIAGNOSIS — K219 Gastro-esophageal reflux disease without esophagitis: Secondary | ICD-10-CM | POA: Diagnosis present

## 2022-04-23 DIAGNOSIS — R2681 Unsteadiness on feet: Secondary | ICD-10-CM | POA: Diagnosis present

## 2022-04-23 DIAGNOSIS — M810 Age-related osteoporosis without current pathological fracture: Secondary | ICD-10-CM | POA: Diagnosis present

## 2022-04-23 DIAGNOSIS — Z79899 Other long term (current) drug therapy: Secondary | ICD-10-CM

## 2022-04-23 DIAGNOSIS — Z885 Allergy status to narcotic agent status: Secondary | ICD-10-CM

## 2022-04-23 DIAGNOSIS — F17290 Nicotine dependence, other tobacco product, uncomplicated: Secondary | ICD-10-CM | POA: Diagnosis present

## 2022-04-23 DIAGNOSIS — R0902 Hypoxemia: Secondary | ICD-10-CM | POA: Diagnosis not present

## 2022-04-23 DIAGNOSIS — Z8711 Personal history of peptic ulcer disease: Secondary | ICD-10-CM

## 2022-04-23 DIAGNOSIS — K589 Irritable bowel syndrome without diarrhea: Secondary | ICD-10-CM | POA: Diagnosis present

## 2022-04-23 DIAGNOSIS — J9601 Acute respiratory failure with hypoxia: Secondary | ICD-10-CM | POA: Diagnosis not present

## 2022-04-23 DIAGNOSIS — I110 Hypertensive heart disease with heart failure: Secondary | ICD-10-CM | POA: Diagnosis present

## 2022-04-23 DIAGNOSIS — J101 Influenza due to other identified influenza virus with other respiratory manifestations: Secondary | ICD-10-CM | POA: Diagnosis not present

## 2022-04-23 DIAGNOSIS — J441 Chronic obstructive pulmonary disease with (acute) exacerbation: Secondary | ICD-10-CM | POA: Diagnosis present

## 2022-04-23 DIAGNOSIS — Z66 Do not resuscitate: Secondary | ICD-10-CM | POA: Diagnosis present

## 2022-04-23 DIAGNOSIS — N179 Acute kidney failure, unspecified: Secondary | ICD-10-CM | POA: Diagnosis not present

## 2022-04-23 DIAGNOSIS — Z1152 Encounter for screening for COVID-19: Secondary | ICD-10-CM

## 2022-04-23 DIAGNOSIS — Z823 Family history of stroke: Secondary | ICD-10-CM

## 2022-04-23 DIAGNOSIS — Z888 Allergy status to other drugs, medicaments and biological substances status: Secondary | ICD-10-CM

## 2022-04-23 DIAGNOSIS — J8410 Pulmonary fibrosis, unspecified: Secondary | ICD-10-CM | POA: Diagnosis present

## 2022-04-23 DIAGNOSIS — I5033 Acute on chronic diastolic (congestive) heart failure: Secondary | ICD-10-CM | POA: Diagnosis present

## 2022-04-23 DIAGNOSIS — Z8249 Family history of ischemic heart disease and other diseases of the circulatory system: Secondary | ICD-10-CM

## 2022-04-23 DIAGNOSIS — F0390 Unspecified dementia without behavioral disturbance: Secondary | ICD-10-CM | POA: Diagnosis present

## 2022-04-23 DIAGNOSIS — J449 Chronic obstructive pulmonary disease, unspecified: Secondary | ICD-10-CM | POA: Diagnosis present

## 2022-04-23 DIAGNOSIS — F419 Anxiety disorder, unspecified: Secondary | ICD-10-CM | POA: Diagnosis present

## 2022-04-23 DIAGNOSIS — Z881 Allergy status to other antibiotic agents status: Secondary | ICD-10-CM

## 2022-04-23 DIAGNOSIS — R531 Weakness: Secondary | ICD-10-CM

## 2022-04-23 DIAGNOSIS — T501X5A Adverse effect of loop [high-ceiling] diuretics, initial encounter: Secondary | ICD-10-CM | POA: Diagnosis not present

## 2022-04-23 DIAGNOSIS — Z882 Allergy status to sulfonamides status: Secondary | ICD-10-CM

## 2022-04-23 LAB — CBC WITH DIFFERENTIAL/PLATELET
Abs Immature Granulocytes: 0.03 10*3/uL (ref 0.00–0.07)
Basophils Absolute: 0 10*3/uL (ref 0.0–0.1)
Basophils Relative: 0 %
Eosinophils Absolute: 0 10*3/uL (ref 0.0–0.5)
Eosinophils Relative: 0 %
HCT: 34.5 % — ABNORMAL LOW (ref 36.0–46.0)
Hemoglobin: 10.8 g/dL — ABNORMAL LOW (ref 12.0–15.0)
Immature Granulocytes: 1 %
Lymphocytes Relative: 6 %
Lymphs Abs: 0.4 10*3/uL — ABNORMAL LOW (ref 0.7–4.0)
MCH: 28.6 pg (ref 26.0–34.0)
MCHC: 31.3 g/dL (ref 30.0–36.0)
MCV: 91.3 fL (ref 80.0–100.0)
Monocytes Absolute: 0.4 10*3/uL (ref 0.1–1.0)
Monocytes Relative: 7 %
Neutro Abs: 4.9 10*3/uL (ref 1.7–7.7)
Neutrophils Relative %: 86 %
Platelets: 209 10*3/uL (ref 150–400)
RBC: 3.78 MIL/uL — ABNORMAL LOW (ref 3.87–5.11)
RDW: 14 % (ref 11.5–15.5)
WBC: 5.7 10*3/uL (ref 4.0–10.5)
nRBC: 0 % (ref 0.0–0.2)

## 2022-04-23 LAB — COMPREHENSIVE METABOLIC PANEL
ALT: 18 U/L (ref 0–44)
AST: 31 U/L (ref 15–41)
Albumin: 3.4 g/dL — ABNORMAL LOW (ref 3.5–5.0)
Alkaline Phosphatase: 92 U/L (ref 38–126)
Anion gap: 13 (ref 5–15)
BUN: 22 mg/dL (ref 8–23)
CO2: 23 mmol/L (ref 22–32)
Calcium: 8.8 mg/dL — ABNORMAL LOW (ref 8.9–10.3)
Chloride: 96 mmol/L — ABNORMAL LOW (ref 98–111)
Creatinine, Ser: 1.12 mg/dL — ABNORMAL HIGH (ref 0.44–1.00)
GFR, Estimated: 48 mL/min — ABNORMAL LOW (ref >60)
Glucose, Bld: 111 mg/dL — ABNORMAL HIGH (ref 70–99)
Potassium: 4.6 mmol/L (ref 3.5–5.1)
Sodium: 132 mmol/L — ABNORMAL LOW (ref 135–145)
Total Bilirubin: 0.4 mg/dL (ref 0.3–1.2)
Total Protein: 6.9 g/dL (ref 6.5–8.1)

## 2022-04-23 LAB — URINALYSIS, W/ REFLEX TO CULTURE (INFECTION SUSPECTED)
Bilirubin Urine: NEGATIVE
Glucose, UA: NEGATIVE mg/dL
Hgb urine dipstick: NEGATIVE
Ketones, ur: NEGATIVE mg/dL
Leukocytes,Ua: NEGATIVE
Nitrite: NEGATIVE
Protein, ur: NEGATIVE mg/dL
Specific Gravity, Urine: 1.011 (ref 1.005–1.030)
pH: 5 (ref 5.0–8.0)

## 2022-04-23 LAB — LIPASE, BLOOD: Lipase: 37 U/L (ref 11–51)

## 2022-04-23 LAB — SARS CORONAVIRUS 2 BY RT PCR: SARS Coronavirus 2 by RT PCR: NEGATIVE

## 2022-04-23 LAB — TROPONIN I (HIGH SENSITIVITY)
Troponin I (High Sensitivity): 3 ng/L (ref <18)
Troponin I (High Sensitivity): 3 ng/L (ref <18)

## 2022-04-23 LAB — BRAIN NATRIURETIC PEPTIDE: B Natriuretic Peptide: 225 pg/mL — ABNORMAL HIGH (ref 0.0–100.0)

## 2022-04-23 MED ORDER — ALPRAZOLAM 0.5 MG PO TABS
0.5000 mg | ORAL_TABLET | Freq: Two times a day (BID) | ORAL | Status: DC
  Administered 2022-04-23 – 2022-04-30 (×14): 0.5 mg via ORAL
  Filled 2022-04-23 (×14): qty 1

## 2022-04-23 MED ORDER — SIMVASTATIN 10 MG PO TABS
5.0000 mg | ORAL_TABLET | Freq: Every day | ORAL | Status: DC
  Administered 2022-04-24 – 2022-04-30 (×7): 5 mg via ORAL
  Filled 2022-04-23 (×7): qty 1

## 2022-04-23 MED ORDER — DONEPEZIL HCL 5 MG PO TABS
10.0000 mg | ORAL_TABLET | Freq: Every morning | ORAL | Status: DC
  Administered 2022-04-24 – 2022-04-30 (×7): 10 mg via ORAL
  Filled 2022-04-23 (×7): qty 2

## 2022-04-23 MED ORDER — AMOXICILLIN-POT CLAVULANATE 500-125 MG PO TABS
1.0000 | ORAL_TABLET | Freq: Two times a day (BID) | ORAL | Status: DC
  Administered 2022-04-23 – 2022-04-30 (×14): 1 via ORAL
  Filled 2022-04-23 (×14): qty 1

## 2022-04-23 MED ORDER — POTASSIUM CHLORIDE CRYS ER 10 MEQ PO TBCR
10.0000 meq | EXTENDED_RELEASE_TABLET | Freq: Every day | ORAL | Status: DC
  Administered 2022-04-24 – 2022-04-28 (×5): 10 meq via ORAL
  Filled 2022-04-23 (×5): qty 1

## 2022-04-23 MED ORDER — GUAIFENESIN-DM 100-10 MG/5ML PO SYRP
5.0000 mL | ORAL_SOLUTION | ORAL | Status: DC | PRN
  Administered 2022-04-23 – 2022-04-29 (×3): 5 mL via ORAL
  Filled 2022-04-23 (×3): qty 5

## 2022-04-23 MED ORDER — PREDNISONE 20 MG PO TABS: 20.0000 mg | ORAL_TABLET | Freq: Every day | ORAL | Status: DC

## 2022-04-23 MED ORDER — LINACLOTIDE 145 MCG PO CAPS: 145.0000 ug | ORAL_CAPSULE | Freq: Every day | ORAL | Status: DC | PRN

## 2022-04-23 MED ORDER — MEMANTINE HCL 10 MG PO TABS
10.0000 mg | ORAL_TABLET | Freq: Two times a day (BID) | ORAL | Status: DC
  Administered 2022-04-23 – 2022-04-30 (×14): 10 mg via ORAL
  Filled 2022-04-23 (×14): qty 1

## 2022-04-23 MED ORDER — IOHEXOL 350 MG/ML SOLN
75.0000 mL | Freq: Once | INTRAVENOUS | Status: AC | PRN
  Administered 2022-04-23: 75 mL via INTRAVENOUS

## 2022-04-23 MED ORDER — PANTOPRAZOLE SODIUM 40 MG PO TBEC
40.0000 mg | DELAYED_RELEASE_TABLET | Freq: Two times a day (BID) | ORAL | Status: DC
  Administered 2022-04-23 – 2022-04-30 (×14): 40 mg via ORAL
  Filled 2022-04-23 (×14): qty 1

## 2022-04-23 MED ORDER — PREDNISONE 20 MG PO TABS: 10.0000 mg | ORAL_TABLET | Freq: Every day | ORAL | Status: DC

## 2022-04-23 MED ORDER — ZOLPIDEM TARTRATE 5 MG PO TABS
5.0000 mg | ORAL_TABLET | Freq: Every day | ORAL | Status: DC
  Administered 2022-04-23 – 2022-04-29 (×7): 5 mg via ORAL
  Filled 2022-04-23 (×7): qty 1

## 2022-04-23 MED ORDER — DICLOFENAC SODIUM 1 % EX GEL
4.0000 g | Freq: Four times a day (QID) | CUTANEOUS | Status: DC
  Administered 2022-04-23 – 2022-04-30 (×25): 4 g via TOPICAL
  Filled 2022-04-23 (×3): qty 100

## 2022-04-23 MED ORDER — AMOXICILLIN-POT CLAVULANATE 875-125 MG PO TABS: 1.0000 | ORAL_TABLET | Freq: Two times a day (BID) | ORAL | Status: DC

## 2022-04-23 MED ORDER — ENOXAPARIN SODIUM 30 MG/0.3ML IJ SOSY
30.0000 mg | PREFILLED_SYRINGE | INTRAMUSCULAR | Status: DC
  Administered 2022-04-23 – 2022-04-29 (×7): 30 mg via SUBCUTANEOUS
  Filled 2022-04-23 (×7): qty 0.3

## 2022-04-23 MED ORDER — FLUTICASONE FUROATE-VILANTEROL 100-25 MCG/ACT IN AEPB
1.0000 | INHALATION_SPRAY | Freq: Every day | RESPIRATORY_TRACT | Status: DC
  Administered 2022-04-24 – 2022-04-30 (×7): 1 via RESPIRATORY_TRACT
  Filled 2022-04-23: qty 28

## 2022-04-23 MED ORDER — ONDANSETRON HCL 4 MG/2ML IJ SOLN: 4.0000 mg | Freq: Four times a day (QID) | INTRAMUSCULAR | Status: DC | PRN

## 2022-04-23 MED ORDER — LORATADINE 10 MG PO TABS
10.0000 mg | ORAL_TABLET | Freq: Every morning | ORAL | Status: DC
  Administered 2022-04-24 – 2022-04-30 (×7): 10 mg via ORAL
  Filled 2022-04-23 (×7): qty 1

## 2022-04-23 MED ORDER — TRAMADOL HCL 50 MG PO TABS
100.0000 mg | ORAL_TABLET | Freq: Two times a day (BID) | ORAL | Status: DC | PRN
  Administered 2022-04-25 – 2022-04-29 (×7): 100 mg via ORAL
  Filled 2022-04-23 (×9): qty 2

## 2022-04-23 MED ORDER — ONDANSETRON HCL 4 MG PO TABS: 4.0000 mg | ORAL_TABLET | Freq: Four times a day (QID) | ORAL | Status: DC | PRN

## 2022-04-23 MED ORDER — TRAMADOL HCL 50 MG PO TABS: 100.0000 mg | ORAL_TABLET | Freq: Four times a day (QID) | ORAL | Status: DC

## 2022-04-23 MED ORDER — FUROSEMIDE 10 MG/ML IJ SOLN
20.0000 mg | Freq: Two times a day (BID) | INTRAMUSCULAR | Status: DC
  Administered 2022-04-23 – 2022-04-24 (×3): 20 mg via INTRAVENOUS
  Filled 2022-04-23 (×3): qty 2

## 2022-04-23 MED ORDER — PREDNISONE 20 MG PO TABS
30.0000 mg | ORAL_TABLET | Freq: Every day | ORAL | Status: AC
  Administered 2022-04-24 – 2022-04-25 (×2): 30 mg via ORAL
  Filled 2022-04-23 (×2): qty 2

## 2022-04-23 MED ORDER — IPRATROPIUM-ALBUTEROL 0.5-2.5 (3) MG/3ML IN SOLN
3.0000 mL | Freq: Four times a day (QID) | RESPIRATORY_TRACT | Status: DC
  Administered 2022-04-23: 3 mL via RESPIRATORY_TRACT
  Filled 2022-04-23: qty 3

## 2022-04-23 MED ORDER — IPRATROPIUM-ALBUTEROL 0.5-2.5 (3) MG/3ML IN SOLN
3.0000 mL | Freq: Four times a day (QID) | RESPIRATORY_TRACT | Status: DC
  Administered 2022-04-24 – 2022-04-25 (×5): 3 mL via RESPIRATORY_TRACT
  Filled 2022-04-23 (×5): qty 3

## 2022-04-23 NOTE — ED Notes (Signed)
Pt ambulated with 2 person assist , family states that she can usually get around herself with a walker

## 2022-04-23 NOTE — ED Provider Notes (Signed)
[place] [facility] Provider Note   CSN: [id number] Arrival date & time: 04/23/22  1000     History  No chief complaint on file.   [name] is a 88 y.o. female.  She has a history of COPD CHF pulmonary fibrosis.  She said today after her bath she was having difficulty walking her feet were dragging and just would not hold her up.  At baseline she walks with a walker.  She is worried she might have a UTI.  She does not endorse any urinary symptoms though.  No fevers or chills.  She does notice a little worsening shortness of breath and feels nauseous and has a mild headache.  No recent falls.  He has a chronic growth on the left posterior neck that she says has been there a long time.  No change.  The history is provided by the patient.  Weakness Severity:  Moderate Onset quality:  Gradual Duration:  1 day Timing:  Constant Progression:  Unchanged Chronicity:  New Relieved by:  Nothing Worsened by:  Activity Ineffective treatments:  None tried Associated symptoms: cough, difficulty walking, headaches, nausea and shortness of breath   Associated symptoms: no abdominal pain, no chest pain, no dysuria, no falls, no fever and no vomiting        Home Medications Prior to Admission medications   Medication Sig Start Date End Date Taking? Authorizing Provider  albuterol (PROAIR HFA) 108 (90 Base) MCG/ACT inhaler Inhale 2 puffs into the lungs every 4 (four) hours as needed for wheezing or shortness of breath. Patient taking differently: Inhale 2 puffs into the lungs every 6 (six) hours as needed for wheezing or shortness of breath. 06/12/21   Johnson, Clanford L, MD  albuterol (PROVENTIL) (2.5 MG/3ML) 0.083% nebulizer solution Take 3 mLs (2.5 mg total) by nebulization every 6 (six) hours as needed for wheezing or shortness of breath. Patient taking differently: Take 2.5 mg by nebulization in the morning, at noon, in the evening, and at bedtime.  06/23/18   Sharee Holster, NP  ALPRAZolam Prudy Feeler) 0.5 MG tablet Take 1 tablet (0.5 mg total) by mouth 2 (two) times daily. 09/30/21   Burnadette Pop, MD  azithromycin (ZITHROMAX) 250 MG tablet Take 1 tablet (250 mg total) by mouth daily for 5 days. 02/28/22 03/05/22  Loetta Rough, MD  collagenase (SANTYL) 250 UNIT/GM ointment Apply 1 Application topically daily. 02/02/22   Candelaria Stagers, DPM  diclofenac Sodium (VOLTAREN) 1 % GEL APPLY AS DIRECTED TO THE AFFECTED KNEE FOUR TIMES DAILY. Patient taking differently: Apply 4 g topically 4 (four) times daily. 08/04/20   Vickki Hearing, MD  donepezil (ARICEPT) 10 MG tablet Take 10 mg by mouth daily. 07/25/19   [provider]  ferrous sulfate 325 (65 FE) MG tablet Take 1 tablet (325 mg total) by mouth daily. 09/30/21 09/30/22  Burnadette Pop, MD  furosemide (LASIX) 40 MG tablet Take 40 mg by mouth daily.    [provider]  ipratropium (ATROVENT) 0.02 % nebulizer solution Take 2.5 mLs (0.5 mg total) by nebulization every 6 (six) hours as needed for wheezing or shortness of breath. 09/30/21   Burnadette Pop, MD  LINZESS 145 MCG CAPS capsule Take 145 mcg by mouth daily as needed (bowel movement). 05/22/21   [provider]  loratadine (CLARITIN) 10 MG tablet Take 10 mg by mouth daily. 08/29/21   [provider]  memantine (NAMENDA) 10 MG tablet Take 10  mg by mouth 2 (two) times daily.    [provider]  Multiple Vitamin (MULTIVITAMIN WITH MINERALS) TABS tablet Take 1 tablet by mouth daily.    [provider]  mupirocin ointment (BACTROBAN) 2 % Apply 1 Application topically daily. 01/12/22   McDonald, Rachelle Hora, DPM  pantoprazole (PROTONIX) 40 MG tablet TAKE (1) TABLET BY MOUTH TWICE A DAY WITH MEALS (BREAKFAST AND SUPPER) Patient taking differently: Take 40 mg by mouth daily. 05/24/19   Anice Paganini, NP  polyethylene glycol (MIRALAX / GLYCOLAX) 17 g packet Take 17 g by mouth daily. 09/30/21   Burnadette Pop,  MD  potassium chloride (K-DUR) 10 MEQ tablet Take 1 tablet (10 mEq total) by mouth daily. 06/23/18   Sharee Holster, NP  simvastatin (ZOCOR) 10 MG tablet Take 5 mg by mouth daily. 02/27/21   [provider]  traMADol (ULTRAM) 50 MG tablet Take 1 tablet (50 mg total) by mouth every 8 (eight) hours as needed for moderate pain. 09/30/21   Burnadette Pop, MD  umeclidinium-vilanterol (ANORO ELLIPTA) 62.5-25 MCG/INH AEPB Inhale 1 puff into the lungs daily. 06/23/18   Sharee Holster, NP  zolpidem (AMBIEN) 5 MG tablet Take 5 mg by mouth at bedtime as needed for sleep.    [provider]      Allergies    Codeine, Codeine, Levofloxacin, Sulfonamide derivatives, Doxycycline, Septra [sulfamethoxazole-trimethoprim], Sulfa antibiotics, and Trimethoprim    Review of Systems   Review of Systems  Constitutional:  Negative for fever.  Eyes:  Negative for visual disturbance.  Respiratory:  Positive for cough and shortness of breath.   Cardiovascular:  Negative for chest pain.  Gastrointestinal:  Positive for nausea. Negative for abdominal pain and vomiting.  Genitourinary:  Negative for dysuria.  Musculoskeletal:  Negative for falls.  Neurological:  Positive for weakness and headaches.    Physical Exam Updated Vital Signs BP (!) 146/85 (BP Location: Left Arm)   Pulse 78   Temp 98 F (36.7 C) (Oral)   Resp 20   Ht  (1.422 m)   Wt 50.4 kg   SpO2 100%   BMI 24.91 kg/m  Physical Exam Vitals and nursing note reviewed.  Constitutional:      General: She is not in acute distress.    Appearance: Normal appearance. She is well-developed.  HENT:     Head: Normocephalic and atraumatic.  Eyes:     Conjunctiva/sclera: Conjunctivae normal.  Neck:     Comments: He has a large growth on the left posterior neck with multiple lobules and a central crusting lesion. Cardiovascular:     Rate and Rhythm: Normal rate and regular rhythm.     Heart sounds: No murmur heard. Pulmonary:      Effort: Tachypnea and accessory muscle usage present. No respiratory distress.     Breath sounds: Normal breath sounds.  Abdominal:     Palpations: Abdomen is soft.     Tenderness: There is no abdominal tenderness.  Musculoskeletal:        General: No deformity.     Cervical back: Neck supple.     Right lower leg: No edema.     Left lower leg: No edema.  Skin:    General: Skin is warm and dry.     Capillary Refill: Capillary refill takes less than 2 seconds.  Neurological:     General: No focal deficit present.     Mental Status: She is alert. Mental status is at baseline.  ED Results / Procedures / Treatments   Labs (all labs ordered are listed, but only abnormal results are displayed) Labs Reviewed  COMPREHENSIVE METABOLIC PANEL - Abnormal; Notable for the following components:      Result Value   Sodium 132 (*)    Chloride 96 (*)    Glucose, Bld 111 (*)    Creatinine, Ser 1.12 (*)    Calcium 8.8 (*)    Albumin 3.4 (*)    GFR, Estimated 48 (*)    All other components within normal limits  CBC WITH DIFFERENTIAL/PLATELET - Abnormal; Notable for the following components:   RBC 3.78 (*)    Hemoglobin 10.8 (*)    HCT 34.5 (*)    Lymphs Abs 0.4 (*)    All other components within normal limits  URINALYSIS, W/ REFLEX TO CULTURE (INFECTION SUSPECTED) - Abnormal; Notable for the following components:   Bacteria, UA FEW (*)    All other components within normal limits  BRAIN NATRIURETIC PEPTIDE - Abnormal; Notable for the following components:   B Natriuretic Peptide 225.0 (*)    All other components within normal limits  SARS CORONAVIRUS 2 BY RT PCR  LIPASE, BLOOD  BASIC METABOLIC PANEL  TROPONIN I (HIGH SENSITIVITY)  TROPONIN I (HIGH SENSITIVITY)    EKG EKG Interpretation  Date/Time:  Friday April 23 2022 10:37:23 EDT Ventricular Rate:  76 PR Interval:  232 QRS Duration: 103 QT Interval:  412 QTC Calculation: 464 R Axis:   90 Text Interpretation: Sinus  rhythm Prolonged PR interval Borderline right axis deviation No significant change since prior 3/24 Confirmed by Meridee Score [phone]) on 04/23/2022 10:57:42 AM  Radiology CT Head Wo Contrast  Result Date: 04/23/2022 CLINICAL DATA:  Headache. EXAM: CT HEAD WITHOUT CONTRAST TECHNIQUE: Contiguous axial images were obtained from the base of the skull through the vertex without intravenous contrast. RADIATION DOSE REDUCTION: This exam was performed according to the departmental dose-optimization program which includes automated exposure control, adjustment of the mA and/or kV according to patient size and/or use of iterative reconstruction technique. COMPARISON:  Head CT 09/24/2021 FINDINGS: Brain: There is no evidence of an acute infarct, intracranial hemorrhage, mass, midline shift, or extra-axial fluid collection. Patchy to confluent hypodensities in the cerebral white matter bilaterally are similar to the prior study and are nonspecific but compatible with extensive chronic small vessel ischemic disease. There is mild cerebral atrophy. Vascular: Calcified atherosclerosis at the skull base. No hyperdense vessel. Skull: No fracture or suspicious osseous lesion. Advanced right TMJ arthropathy. Resorption or postsurgical deficiency of the left mandibular condyle. Sinuses/Orbits: Minimal mucosal thickening in the paranasal sinuses. Increased right mastoid effusion with surrounding sclerosis. Increased right middle ear opacification. Unremarkable orbits. Other: Partially visualized complex superficial structure containing gas in the posterolateral left upper neck, also present previously. IMPRESSION: 1. No evidence of acute intracranial abnormality. 2. Extensive chronic small vessel ischemic disease. 3. Chronic right mastoid and middle ear effusions. Electronically Signed   By: Sebastian Ache M.D.   On: 04/23/2022 14:28   CT Angio Chest PE W/Cm &/Or Wo Cm  Result Date: 04/23/2022 CLINICAL DATA:  Pulmonary embolism  (PE) suspected, high prob EXAM: CT ANGIOGRAPHY CHEST WITH CONTRAST TECHNIQUE: Multidetector CT imaging of the chest was performed using the standard protocol during bolus administration of intravenous contrast. Multiplanar CT image reconstructions and MIPs were obtained to evaluate the vascular anatomy. RADIATION DOSE REDUCTION: This exam was performed according to the departmental dose-optimization program which includes automated exposure control, adjustment of the  mA and/or kV according to patient size and/or use of iterative reconstruction technique. CONTRAST:  75mL OMNIPAQUE IOHEXOL 350 MG/ML SOLN COMPARISON:  04/19/2022 FINDINGS: Cardiovascular: Satisfactory opacification of the pulmonary arteries to the segmental level. No evidence of pulmonary embolism. Tortuosity of the thoracic aorta without aneurysmal dilation. Atherosclerotic calcifications of the aorta and coronary arteries. Normal heart size. Small pericardial effusion. Mediastinum/Nodes: Large hiatal hernia containing the majority of the stomach within the chest. Prominent mediastinal lymph nodes including 1.4 cm AP window node (series 5, image 110). No axillary or hilar lymphadenopathy. Trachea within normal limits. Lungs/Pleura: Findings of basilar predominant pulmonary fibrosis. Trace left pleural effusion. Previously described irregular nodule in the left lower lobe appears less solid on the current study. No pneumothorax. Upper Abdomen: Left-sided nephrolithiasis with partially imaged bilateral hydronephrosis. Musculoskeletal: Severe thoracic kyphosis with numerous thoracic vertebral body compression fractures. No new or progressive fracture is seen compared to the recent prior CT. Review of the MIP images confirms the above findings. IMPRESSION: 1. No evidence of pulmonary embolism. 2. Chronic findings of pulmonary fibrosis. 3. Previously described irregular nodule in the left lower lobe appears less solid on the current study. 4. Trace left  pleural effusion. 5. Large hiatal hernia containing the majority of the stomach within the chest. 6. Left-sided nephrolithiasis with partially imaged bilateral hydronephrosis. 7. Severe thoracic kyphosis with numerous thoracic vertebral body compression fractures. No new or progressive fracture is seen compared to the recent prior CT. 8. Aortic and coronary artery atherosclerosis (ICD10-I70.0). Electronically Signed   By: Duanne Guess D.O.   On: 04/23/2022 14:21   DG Chest Port 1 View  Result Date: 04/23/2022 CLINICAL DATA:  Weakness. EXAM: PORTABLE CHEST 1 VIEW COMPARISON:  03/25/2022 FINDINGS: Patient is rotated to the right. Lungs are adequately inflated with mild opacification over the right suprahilar region likely prominent vasculature although cannot exclude a pulmonary parenchymal process such as infection. There is evidence of mild vascular congestion. Cardiomediastinal silhouette is otherwise unchanged. Evidence of known moderate size hiatal hernia. Remainder of the exam is unchanged. IMPRESSION: 1. Mild opacification over the right suprahilar region likely prominent vasculature, although cannot exclude a pulmonary parenchymal process such as infection. 2. Mild vascular congestion. Electronically Signed   By: Elberta Fortis M.D.   On: 04/23/2022 11:08    Procedures Procedures    Medications Ordered in ED Medications  furosemide (LASIX) injection 20 mg (20 mg Intravenous Given 04/23/22 1601)  simvastatin (ZOCOR) tablet 5 mg (has no administration in time range)  ALPRAZolam (XANAX) tablet 0.5 mg (has no administration in time range)  donepezil (ARICEPT) tablet 10 mg (has no administration in time range)  memantine (NAMENDA) tablet 10 mg (has no administration in time range)  zolpidem (AMBIEN) tablet 5 mg (has no administration in time range)  predniSONE (DELTASONE) tablet 30 mg (has no administration in time range)  linaclotide (LINZESS) capsule 145 mcg (has no administration in time  range)  pantoprazole (PROTONIX) EC tablet 40 mg (has no administration in time range)  potassium chloride (KLOR-CON M) CR tablet 10 mEq (has no administration in time range)  fluticasone furoate-vilanterol (BREO ELLIPTA) 100-25 MCG/ACT 1 puff (has no administration in time range)  ipratropium-albuterol (DUONEB) 0.5-2.5 (3) MG/3ML nebulizer solution 3 mL (has no administration in time range)  diclofenac Sodium (VOLTAREN) 1 % topical gel 4 g (has no administration in time range)  loratadine (CLARITIN) tablet 10 mg (has no administration in time range)  enoxaparin (LOVENOX) injection 30 mg (has no administration in time  range)  ondansetron (ZOFRAN) tablet 4 mg (has no administration in time range)    Or  ondansetron (ZOFRAN) injection 4 mg (has no administration in time range)  traMADol (ULTRAM) tablet 100 mg (has no administration in time range)  amoxicillin-clavulanate (AUGMENTIN) 500-125 MG per tablet 1 tablet (has no administration in time range)  predniSONE (DELTASONE) tablet 20 mg (has no administration in time range)  predniSONE (DELTASONE) tablet 10 mg (has no administration in time range)  iohexol (OMNIPAQUE) 350 MG/ML injection 75 mL (75 mLs Intravenous Contrast Given 04/23/22 1337)    ED Course/ Medical Decision Making/ A&P Clinical Course as of 04/23/22 1704  Fri Apr 23, 2022  1254 Since daughter is here now.  She said patient has not really been eating or drinking for the last 3 days and has been sleeping all the time.  Today it took 3 of them to get her upright and she was really not able to walk.  This is new for patient who can usually get herself up and ambulate with her walker.  Daughter also states she has been having some cough and shortness of breath and they talked to her pulmonologist who just started her on prednisone and Augmentin. [MB]  1501 Patient's oxygen saturation has been going between 88 and 92% on room air.  She does not have oxygen at home.  Daughter is concerned  that she is too weak on her feet and would fall. [MB]    Clinical Course User Index [MB] Terrilee Files, MD                             Medical Decision Making Amount and/or Complexity of Data Reviewed Labs: ordered. Radiology: ordered.  Risk Prescription drug management. Decision regarding hospitalization.   This patient complains of weakness shortness of breath headache nausea; this involves an extensive number of treatment Options and is a complaint that carries with it a high risk of complications and morbidity. The differential includes weakness, infection, dehydration, arrhythmia  I ordered, reviewed and interpreted labs, which included CBC with normal white count, low stable hemoglobin, chemistries fairly unremarkable, BNP elevated, troponins flat, urinalysis without signs of infection, COVID-negative I ordered imaging studies which included chest x-ray and CT head CT angio chest and I independently    visualized and interpreted imaging which showed no acute findings.  Does have large hiatal hernia and other incidental findings Additional history obtained from patient's daughter Previous records obtained and reviewed in epic including recent clinic and urgent care notes I consulted Dr. Adrian Blackwater [facility] hospitalist and discussed lab and imaging findings and discussed disposition.  Cardiac monitoring reviewed, sinus rhythm Social determinants considered, ongoing tobacco use Critical Interventions: None  After the interventions stated above, I reevaluated the patient and found patient to be well-appearing in no distress Admission and further testing considered, still remains with borderline oxygenation and weakness.  Daughter concerned with her going home as normally she is independent and able to ambulate.  Appreciate Dr. Adrian Blackwater hospitalist for evaluating patient for hypoxia and admission         Final Clinical Impression(s) / ED Diagnoses Final diagnoses:  Acute  respiratory failure with hypoxia  Generalized weakness    Rx / DC Orders ED Discharge Orders     None         Terrilee Files, MD 04/23/22 1708

## 2022-04-23 NOTE — H&P (Signed)
History and Physical    Patient: [name] OZH:[id number] DOB: September 20, 1934 DOA: 04/23/2022 DOS: the patient was seen and examined on 04/23/2022 PCP: Marylynn Pearson, FNP  Patient coming from: Home  Chief Complaint:  Chief Complaint  Patient presents with   Weakness   HPI: [name] is a 88 y.o. female with medical history significant of heart failure with preserved EF, COPD, IBS, pulmonary fibrosis, hypertension, dementia.  Patient has a caregiver 8 hours a day.  Her daughter is her guardian due to her dementia.  Her daughter provides much of the history.  The patient has had decreased in function from baseline over the past 3 days.  She has been sleeping a lot and very unsteady on her feet.  Normally she is able to walk with a walker with minimal assistance and aid in getting to the bathroom and changing her depends.  Over the past 3 days, she is needed a lot of assistance when using her walker.  She is almost fell a few times using her walker due to imbalance.  She has been more short of breath and coughing with sputum production.  Her daughter called her pulmonologist, who prescribed her Augmentin and prednisone.  She took her first dose this morning.  Denies fevers, chills, chest pain.  Her appetite has been greatly diminished.  Review of Systems: As mentioned in the history of present illness. All other systems reviewed and are negative. Past Medical History:  Diagnosis Date   Anxiety    Chronic diastolic (congestive) heart failure    Chronic obstructive pulmonary disease, unspecified    COPD (chronic obstructive pulmonary disease)    GERD (gastroesophageal reflux disease)    Hyperlipidemia    IBS (irritable bowel syndrome)    Lumbago with sciatica    Osteoporosis    Overactive bladder    Pneumonia    Pulmonary fibrosis    Past Surgical History:  Procedure Laterality Date   COLONOSCOPY  10/04/2009   sigmoid diverticula, multiple tubulovillous adneomas, needs surveillance  Oct 2013   ESOPHAGOGASTRODUODENOSCOPY  04/16/2011   Dr. Jena Gauss: Noncritical Schatzkis ring( not manipulated because no dysphagia). Normal esophagus otherwise  large hiatal hernia. Gastric polyp-status post biopsy. Gastric erosions-staus post biopsy. Abnormal bulb-status post biopsy. benign small bowel, stomach biopsy with ulcerated gastric antral mucosa with foveolar hyperplasia and surface erosion, polyp with inflamed gastric antral mucosa    ESOPHAGOGASTRODUODENOSCOPY N/A 04/29/2014   Dr. Jena Gauss: Noncritical Schatzki's ring large hiatal hernia. Retained gastric contents.GES with slight delay   femur fracture Left 2023   HIP ARTHROPLASTY Left 09/24/2021   Procedure: ARTHROPLASTY BIPOLAR HIP (HEMIARTHROPLASTY);  Surgeon: Oliver Barre, MD;  Location: [facility];  Service: Orthopedics;  Laterality: Left;   HIP PINNING,CANNULATED Right 04/06/2018   Procedure: CANNULATED HIP PINNING;  Surgeon: Vickki Hearing, MD;  Location: [facility];  Service: Orthopedics;  Laterality: Right;   Social History:  reports that she has quit smoking. Her smoking use included cigarettes. She has been exposed to tobacco smoke. Her smokeless tobacco use includes snuff. She reports that she does not drink alcohol and does not use drugs.  Allergies  Allergen Reactions   Codeine Shortness Of Breath and Rash   Codeine Anaphylaxis   Gabapentin Anaphylaxis   Levofloxacin Anaphylaxis    Patient was admitted to hospital with lung problems due to this medication   Sulfonamide Derivatives Hives and Shortness Of Breath   Aloe Other (See Comments)   Doxycycline Hives   Septra [Sulfamethoxazole-Trimethoprim]  Sulfa Antibiotics Hives   Trimethoprim     Family History  Problem Relation Age of Onset   Stroke Mother    Diabetes Mother    Heart disease Father    Colon cancer Neg Hx     Prior to Admission medications   Medication Sig Start Date End Date Taking? Authorizing Provider  albuterol (PROAIR HFA) 108 (90 Base)  MCG/ACT inhaler Inhale 2 puffs into the lungs every 4 (four) hours as needed for wheezing or shortness of breath. Patient taking differently: Inhale 2 puffs into the lungs every 6 (six) hours as needed for wheezing or shortness of breath. 06/12/21  Yes Johnson, Clanford L, MD  ALPRAZolam (XANAX) 0.5 MG tablet Take 1 tablet (0.5 mg total) by mouth 2 (two) times daily. 09/30/21  Yes Burnadette Pop, MD  amoxicillin-clavulanate (AUGMENTIN) 875-125 MG tablet Take 1 tablet by mouth 2 (two) times daily. 04/22/22 04/29/22 Yes [provider]  diclofenac Sodium (VOLTAREN) 1 % GEL APPLY AS DIRECTED TO THE AFFECTED KNEE FOUR TIMES DAILY. Patient taking differently: Apply 4 g topically 4 (four) times daily. 08/04/20  Yes Vickki Hearing, MD  donepezil (ARICEPT) 10 MG tablet Take 10 mg by mouth every morning. 07/25/19  Yes [provider]  fluticasone furoate-vilanterol (BREO ELLIPTA) 100-25 MCG/ACT AEPB Inhale 1 puff into the lungs every morning.   Yes [provider]  furosemide (LASIX) 40 MG tablet Take 40 mg by mouth daily.   Yes [provider]  ipratropium-albuterol (DUONEB) 0.5-2.5 (3) MG/3ML SOLN Take 3 mLs by nebulization in the morning, at noon, in the evening, and at bedtime.   Yes [provider]  LINZESS 145 MCG CAPS capsule Take 145 mcg by mouth daily as needed (bowel movement). 05/22/21  Yes [provider]  loratadine (CLARITIN) 10 MG tablet Take 10 mg by mouth every morning. 08/29/21  Yes [provider]  memantine (NAMENDA) 10 MG tablet Take 10 mg by mouth 2 (two) times daily.   Yes [provider]  pantoprazole (PROTONIX) 40 MG tablet TAKE (1) TABLET BY MOUTH TWICE A DAY WITH MEALS (BREAKFAST AND SUPPER) Patient taking differently: Take 40 mg by mouth 2 (two) times daily. 05/24/19  Yes Anice Paganini, NP  potassium chloride (K-DUR) 10 MEQ tablet Take 1 tablet (10 mEq total) by mouth daily. 06/23/18  Yes Sharee Holster, NP   predniSONE (DELTASONE) 10 MG tablet Take 10-40 mg by mouth See admin instructions. Take 40mg  by mouth daily for two days, then take 30mg  by mouth daily for two days, then take 20mg  by mouth daily for two days and then take 10mg  by mouth daily for two days. 04/22/22  Yes [provider]  simvastatin (ZOCOR) 10 MG tablet Take 5 mg by mouth daily. 02/27/21  Yes [provider]  traMADol (ULTRAM) 50 MG tablet Take 1 tablet (50 mg total) by mouth every 8 (eight) hours as needed for moderate pain. Patient taking differently: Take 100 mg by mouth every 6 (six) hours. 09/30/21  Yes Burnadette Pop, MD  zolpidem (AMBIEN) 5 MG tablet Take 5 mg by mouth at bedtime.   Yes [provider]  Multiple Vitamin (MULTIVITAMIN WITH MINERALS) TABS tablet Take 1 tablet by mouth daily.    [provider]  mupirocin ointment (BACTROBAN) 2 % Apply 1 Application topically daily. Patient not taking: Reported on 04/23/2022 01/12/22   Edwin Cap, DPM  polyethylene glycol (MIRALAX / GLYCOLAX) 17 g packet Take 17 g by mouth daily.  Patient not taking: Reported on 04/23/2022 09/30/21   Burnadette Pop, MD    Physical Exam: Vitals:   04/23/22 1022 04/23/22 1023 04/23/22 1025 04/23/22 1435  BP: 116/75   118/81  Pulse: 82   74  Resp: 18   19  Temp: 97.9 F (36.6 C)  98.8 F (37.1 C) 98 F (36.7 C)  TempSrc: Oral  Oral Oral  SpO2: 91%   92%  Weight:  47.6 kg    Height:   (1.422 m)     General: Elderly female. Awake and alert and oriented x3. No acute cardiopulmonary distress.  HEENT: Normocephalic atraumatic.  Right and left ears normal in appearance.  Pupils equal, round, reactive to light. Extraocular muscles are intact. Sclerae anicteric and noninjected.  Moist mucosal membranes.  Poor dentition.  No mucosal lesions.  Neck: Neck supple without lymphadenopathy. No carotid bruits. No masses palpated.  Cardiovascular: Regular rate with normal S1-S2 sounds. No murmurs, rubs, gallops  auscultated.  Slightly elevated JVD.  Respiratory: Rales in bases bilaterally with wheeze on the left side.  No accessory muscle use. Abdomen: Soft, nontender, nondistended. Active bowel sounds. No masses or hepatosplenomegaly  Skin: No rashes, lesions, or ulcerations.  Dry, warm to touch. 2+ dorsalis pedis and radial pulses. Musculoskeletal: No calf or leg pain. All major joints not erythematous nontender.  No upper or lower joint deformation.  Good ROM.  No contractures  Psychiatric: Intact judgment and insight. Pleasant and cooperative. Neurologic: No focal neurological deficits. Strength is 5/5 and symmetric in upper and lower extremities.  Cranial nerves II through XII are grossly intact.  Data Reviewed: Results for orders placed or performed during the hospital encounter of 04/23/22 (from the past 24 hour(s))  Comprehensive metabolic panel     Status: Abnormal   Collection Time: 04/23/22 10:20 AM  Result Value Ref Range   Sodium 132 (L) 135 - 145 mmol/L   Potassium 4.6 3.5 - 5.1 mmol/L   Chloride 96 (L) 98 - 111 mmol/L   CO2 23 22 - 32 mmol/L   Glucose, Bld 111 (H) 70 - 99 mg/dL   BUN 22 8 - 23 mg/dL   Creatinine, Ser 7.42 (H) 0.44 - 1.00 mg/dL   Calcium 8.8 (L) 8.9 - 10.3 mg/dL   Total Protein 6.9 6.5 - 8.1 g/dL   Albumin 3.4 (L) 3.5 - 5.0 g/dL   AST 31 15 - 41 U/L   ALT 18 0 - 44 U/L   Alkaline Phosphatase 92 38 - 126 U/L   Total Bilirubin 0.4 0.3 - 1.2 mg/dL   GFR, Estimated 48 (L) >60 mL/min   Anion gap 13 5 - 15  Lipase, blood     Status: None   Collection Time: 04/23/22 10:20 AM  Result Value Ref Range   Lipase 37 11 - 51 U/L  Troponin I (High Sensitivity)     Status: None   Collection Time: 04/23/22 10:20 AM  Result Value Ref Range   Troponin I (High Sensitivity) 3 <18 ng/L  CBC with Differential     Status: Abnormal   Collection Time: 04/23/22 10:20 AM  Result Value Ref Range   WBC 5.7 4.0 - 10.5 K/uL   RBC 3.78 (L) 3.87 - 5.11 MIL/uL   Hemoglobin 10.8 (L) 12.0  - 15.0 g/dL   HCT 59.5 (L) 63.8 - 75.6 %   MCV 91.3 80.0 - 100.0 fL   MCH 28.6 26.0 - 34.0 pg   MCHC 31.3 30.0 - 36.0 g/dL  RDW 14.0 11.5 - 15.5 %   Platelets 209 150 - 400 K/uL   nRBC 0.0 0.0 - 0.2 %   Neutrophils Relative % 86 %   Neutro Abs 4.9 1.7 - 7.7 K/uL   Lymphocytes Relative 6 %   Lymphs Abs 0.4 (L) 0.7 - 4.0 K/uL   Monocytes Relative 7 %   Monocytes Absolute 0.4 0.1 - 1.0 K/uL   Eosinophils Relative 0 %   Eosinophils Absolute 0.0 0.0 - 0.5 K/uL   Basophils Relative 0 %   Basophils Absolute 0.0 0.0 - 0.1 K/uL   Immature Granulocytes 1 %   Abs Immature Granulocytes 0.03 0.00 - 0.07 K/uL  SARS Coronavirus 2 by RT PCR (hospital order, performed in [facility] hospital lab) *cepheid single result test* Anterior Nasal Swab     Status: None   Collection Time: 04/23/22 10:31 AM   Specimen: Anterior Nasal Swab  Result Value Ref Range   SARS Coronavirus 2 by RT PCR NEGATIVE NEGATIVE  Brain natriuretic peptide     Status: Abnormal   Collection Time: 04/23/22 10:38 AM  Result Value Ref Range   B Natriuretic Peptide 225.0 (H) 0.0 - 100.0 pg/mL  Urinalysis, w/ Reflex to Culture (Infection Suspected) -Urine, Clean Catch     Status: Abnormal   Collection Time: 04/23/22 10:43 AM  Result Value Ref Range   Specimen Source URINE, CLEAN CATCH    Color, Urine YELLOW YELLOW   APPearance CLEAR CLEAR   Specific Gravity, Urine 1.011 1.005 - 1.030   pH 5.0 5.0 - 8.0   Glucose, UA NEGATIVE NEGATIVE mg/dL   Hgb urine dipstick NEGATIVE NEGATIVE   Bilirubin Urine NEGATIVE NEGATIVE   Ketones, ur NEGATIVE NEGATIVE mg/dL   Protein, ur NEGATIVE NEGATIVE mg/dL   Nitrite NEGATIVE NEGATIVE   Leukocytes,Ua NEGATIVE NEGATIVE   RBC / HPF 0-5 0 - 5 RBC/hpf   WBC, UA 0-5 0 - 5 WBC/hpf   Bacteria, UA FEW (A) NONE SEEN   Squamous Epithelial / HPF 0-5 0 - 5 /HPF   WBC Clumps PRESENT    Hyaline Casts, UA PRESENT   Troponin I (High Sensitivity)     Status: None   Collection Time: 04/23/22 12:56 PM   Result Value Ref Range   Troponin I (High Sensitivity) 3 <18 ng/L   CT Head Wo Contrast  Result Date: 04/23/2022 CLINICAL DATA:  Headache. EXAM: CT HEAD WITHOUT CONTRAST TECHNIQUE: Contiguous axial images were obtained from the base of the skull through the vertex without intravenous contrast. RADIATION DOSE REDUCTION: This exam was performed according to the departmental dose-optimization program which includes automated exposure control, adjustment of the mA and/or kV according to patient size and/or use of iterative reconstruction technique. COMPARISON:  Head CT 09/24/2021 FINDINGS: Brain: There is no evidence of an acute infarct, intracranial hemorrhage, mass, midline shift, or extra-axial fluid collection. Patchy to confluent hypodensities in the cerebral white matter bilaterally are similar to the prior study and are nonspecific but compatible with extensive chronic small vessel ischemic disease. There is mild cerebral atrophy. Vascular: Calcified atherosclerosis at the skull base. No hyperdense vessel. Skull: No fracture or suspicious osseous lesion. Advanced right TMJ arthropathy. Resorption or postsurgical deficiency of the left mandibular condyle. Sinuses/Orbits: Minimal mucosal thickening in the paranasal sinuses. Increased right mastoid effusion with surrounding sclerosis. Increased right middle ear opacification. Unremarkable orbits. Other: Partially visualized complex superficial structure containing gas in the posterolateral left upper neck, also present previously. IMPRESSION: 1. No evidence of acute intracranial  abnormality. 2. Extensive chronic small vessel ischemic disease. 3. Chronic right mastoid and middle ear effusions. Electronically Signed   By: Sebastian Ache M.D.   On: 04/23/2022 14:28   CT Angio Chest PE W/Cm &/Or Wo Cm  Result Date: 04/23/2022 CLINICAL DATA:  Pulmonary embolism (PE) suspected, high prob EXAM: CT ANGIOGRAPHY CHEST WITH CONTRAST TECHNIQUE: Multidetector CT imaging  of the chest was performed using the standard protocol during bolus administration of intravenous contrast. Multiplanar CT image reconstructions and MIPs were obtained to evaluate the vascular anatomy. RADIATION DOSE REDUCTION: This exam was performed according to the departmental dose-optimization program which includes automated exposure control, adjustment of the mA and/or kV according to patient size and/or use of iterative reconstruction technique. CONTRAST:  75mL OMNIPAQUE IOHEXOL 350 MG/ML SOLN COMPARISON:  04/19/2022 FINDINGS: Cardiovascular: Satisfactory opacification of the pulmonary arteries to the segmental level. No evidence of pulmonary embolism. Tortuosity of the thoracic aorta without aneurysmal dilation. Atherosclerotic calcifications of the aorta and coronary arteries. Normal heart size. Small pericardial effusion. Mediastinum/Nodes: Large hiatal hernia containing the majority of the stomach within the chest. Prominent mediastinal lymph nodes including 1.4 cm AP window node (series 5, image 110). No axillary or hilar lymphadenopathy. Trachea within normal limits. Lungs/Pleura: Findings of basilar predominant pulmonary fibrosis. Trace left pleural effusion. Previously described irregular nodule in the left lower lobe appears less solid on the current study. No pneumothorax. Upper Abdomen: Left-sided nephrolithiasis with partially imaged bilateral hydronephrosis. Musculoskeletal: Severe thoracic kyphosis with numerous thoracic vertebral body compression fractures. No new or progressive fracture is seen compared to the recent prior CT. Review of the MIP images confirms the above findings. IMPRESSION: 1. No evidence of pulmonary embolism. 2. Chronic findings of pulmonary fibrosis. 3. Previously described irregular nodule in the left lower lobe appears less solid on the current study. 4. Trace left pleural effusion. 5. Large hiatal hernia containing the majority of the stomach within the chest. 6.  Left-sided nephrolithiasis with partially imaged bilateral hydronephrosis. 7. Severe thoracic kyphosis with numerous thoracic vertebral body compression fractures. No new or progressive fracture is seen compared to the recent prior CT. 8. Aortic and coronary artery atherosclerosis (ICD10-I70.0). Electronically Signed   By: Duanne Guess D.O.   On: 04/23/2022 14:21   DG Chest Port 1 View  Result Date: 04/23/2022 CLINICAL DATA:  Weakness. EXAM: PORTABLE CHEST 1 VIEW COMPARISON:  03/25/2022 FINDINGS: Patient is rotated to the right. Lungs are adequately inflated with mild opacification over the right suprahilar region likely prominent vasculature although cannot exclude a pulmonary parenchymal process such as infection. There is evidence of mild vascular congestion. Cardiomediastinal silhouette is otherwise unchanged. Evidence of known moderate size hiatal hernia. Remainder of the exam is unchanged. IMPRESSION: 1. Mild opacification over the right suprahilar region likely prominent vasculature, although cannot exclude a pulmonary parenchymal process such as infection. 2. Mild vascular congestion. Electronically Signed   By: Elberta Fortis M.D.   On: 04/23/2022 11:08     Assessment and Plan: No notes have been filed under this hospital service. Service: Hospitalist  Principal Problem:   Hypoxia Active Problems:   COPD with acute exacerbation   Dementia   Acute on chronic diastolic (congestive) heart failure  Hypoxia COPD with exacerbation Mild exacerbation.  Will continue with prednisone and Augmentin. Continue DuoNeb 4 times daily Continue Breo inhaler Acute on chronic diastolic heart failure Telemetry monitoring Strict I/O Daily Weights Diuresis: 20 mg IV twice daily Potassium: 10 mEq twice a day by mouth Echo cardiac exam  tomorrow Repeat BMP tomorrow Dementia Continue namenda and aricept Hypertension BP stable   Advance Care Planning:   Code Status: DNR Confirmed by patient's  daughter  Consults: none  Family Communication: patient's daughter present  Severity of Illness: The appropriate patient status for this patient is OBSERVATION. Observation status is judged to be reasonable and necessary in order to provide the required intensity of service to ensure the patient's safety. The patient's presenting symptoms, physical exam findings, and initial radiographic and laboratory data in the context of their medical condition is felt to place them at decreased risk for further clinical deterioration. Furthermore, it is anticipated that the patient will be medically stable for discharge from the hospital within 2 midnights of admission.   Author: Levie Heritage, DO 04/23/2022 4:16 PM  For on call review [email or web address].

## 2022-04-23 NOTE — ED Triage Notes (Signed)
Pt brought in by [facility] for c/o not being able to walk; pt states she is usually able to walk daily and take care of her ADLs but states today when she tried to get up to go to the bathroom pt was unable to get up and walk  Family requests pt be checked for UTI due to strong odor of urine

## 2022-04-23 NOTE — ED Triage Notes (Signed)
Pt here  from home with c/o weakness and not feeling well not able to walk and get around like her normal , pt thinks that she may have an UTI

## 2022-04-24 ENCOUNTER — Observation Stay (HOSPITAL_COMMUNITY): Payer: Medicare Other

## 2022-04-24 DIAGNOSIS — Z881 Allergy status to other antibiotic agents status: Secondary | ICD-10-CM | POA: Diagnosis not present

## 2022-04-24 DIAGNOSIS — Z885 Allergy status to narcotic agent status: Secondary | ICD-10-CM | POA: Diagnosis not present

## 2022-04-24 DIAGNOSIS — R2681 Unsteadiness on feet: Secondary | ICD-10-CM | POA: Diagnosis present

## 2022-04-24 DIAGNOSIS — J441 Chronic obstructive pulmonary disease with (acute) exacerbation: Secondary | ICD-10-CM | POA: Diagnosis present

## 2022-04-24 DIAGNOSIS — T501X5A Adverse effect of loop [high-ceiling] diuretics, initial encounter: Secondary | ICD-10-CM | POA: Diagnosis not present

## 2022-04-24 DIAGNOSIS — J101 Influenza due to other identified influenza virus with other respiratory manifestations: Secondary | ICD-10-CM | POA: Diagnosis present

## 2022-04-24 DIAGNOSIS — Z96642 Presence of left artificial hip joint: Secondary | ICD-10-CM | POA: Diagnosis present

## 2022-04-24 DIAGNOSIS — I5033 Acute on chronic diastolic (congestive) heart failure: Secondary | ICD-10-CM | POA: Diagnosis present

## 2022-04-24 DIAGNOSIS — F0390 Unspecified dementia without behavioral disturbance: Secondary | ICD-10-CM | POA: Diagnosis present

## 2022-04-24 DIAGNOSIS — Y92239 Unspecified place in hospital as the place of occurrence of the external cause: Secondary | ICD-10-CM | POA: Diagnosis not present

## 2022-04-24 DIAGNOSIS — N179 Acute kidney failure, unspecified: Secondary | ICD-10-CM | POA: Diagnosis not present

## 2022-04-24 DIAGNOSIS — R0902 Hypoxemia: Secondary | ICD-10-CM

## 2022-04-24 DIAGNOSIS — F17290 Nicotine dependence, other tobacco product, uncomplicated: Secondary | ICD-10-CM | POA: Diagnosis present

## 2022-04-24 DIAGNOSIS — Z8249 Family history of ischemic heart disease and other diseases of the circulatory system: Secondary | ICD-10-CM | POA: Diagnosis not present

## 2022-04-24 DIAGNOSIS — I110 Hypertensive heart disease with heart failure: Secondary | ICD-10-CM | POA: Diagnosis present

## 2022-04-24 DIAGNOSIS — J9601 Acute respiratory failure with hypoxia: Secondary | ICD-10-CM | POA: Diagnosis present

## 2022-04-24 DIAGNOSIS — M810 Age-related osteoporosis without current pathological fracture: Secondary | ICD-10-CM | POA: Diagnosis present

## 2022-04-24 DIAGNOSIS — F419 Anxiety disorder, unspecified: Secondary | ICD-10-CM | POA: Diagnosis present

## 2022-04-24 DIAGNOSIS — Z1152 Encounter for screening for COVID-19: Secondary | ICD-10-CM | POA: Diagnosis not present

## 2022-04-24 DIAGNOSIS — K219 Gastro-esophageal reflux disease without esophagitis: Secondary | ICD-10-CM | POA: Diagnosis present

## 2022-04-24 DIAGNOSIS — Z882 Allergy status to sulfonamides status: Secondary | ICD-10-CM | POA: Diagnosis not present

## 2022-04-24 DIAGNOSIS — Z888 Allergy status to other drugs, medicaments and biological substances status: Secondary | ICD-10-CM | POA: Diagnosis not present

## 2022-04-24 DIAGNOSIS — J8410 Pulmonary fibrosis, unspecified: Secondary | ICD-10-CM | POA: Diagnosis present

## 2022-04-24 DIAGNOSIS — Z66 Do not resuscitate: Secondary | ICD-10-CM | POA: Diagnosis present

## 2022-04-24 DIAGNOSIS — K589 Irritable bowel syndrome without diarrhea: Secondary | ICD-10-CM | POA: Diagnosis present

## 2022-04-24 DIAGNOSIS — E785 Hyperlipidemia, unspecified: Secondary | ICD-10-CM | POA: Diagnosis present

## 2022-04-24 LAB — ECHOCARDIOGRAM COMPLETE
AR max vel: 1.72 cm2
AV Area VTI: 1.56 cm2
AV Area mean vel: 1.59 cm2
AV Mean grad: 8 mmHg
AV Peak grad: 14.9 mmHg
Ao pk vel: 1.93 m/s
Area-P 1/2: 4.18 cm2
Height: 56 in
MV VTI: 1.73 cm2
S' Lateral: 1.7 cm
Weight: 1777.79 oz

## 2022-04-24 LAB — RESPIRATORY PANEL BY PCR

## 2022-04-24 LAB — BASIC METABOLIC PANEL
Anion gap: 11 (ref 5–15)
BUN: 24 mg/dL — ABNORMAL HIGH (ref 8–23)
CO2: 26 mmol/L (ref 22–32)
Calcium: 8.6 mg/dL — ABNORMAL LOW (ref 8.9–10.3)
Chloride: 97 mmol/L — ABNORMAL LOW (ref 98–111)
Creatinine, Ser: 1.1 mg/dL — ABNORMAL HIGH (ref 0.44–1.00)
GFR, Estimated: 49 mL/min — ABNORMAL LOW (ref >60)
Glucose, Bld: 93 mg/dL (ref 70–99)
Potassium: 4.3 mmol/L (ref 3.5–5.1)
Sodium: 134 mmol/L — ABNORMAL LOW (ref 135–145)

## 2022-04-24 NOTE — Evaluation (Signed)
Physical Therapy Evaluation Patient Details Name: [name] MRN: [id number] DOB: February 18, 1934 Today's Date: 04/24/2022  History of Present Illness  PT has a sitter 8 hr/ day due to dementia.  Pt normally can ambulate with minimal assist with a walker but has been having increasing difficulty and weakness for the past three days.  Clinical Impression  Pt agreeable and cooperative with therapy.  Needed min assist for bed mobility.  Min assist for transfers with walker, however activity tolerance is greatly decreased with pt initially only able to stand for a second prior to sitting down.  Therapist urge pt to try to stay up to at least get into the chair on second attempt which she did, however, was unable to stand any longer than it took to get pt to stand.        Recommendations for follow up therapy are one component of a multi-disciplinary discharge planning process, led by the attending physician.  Recommendations may be updated based on patient status, additional functional criteria and insurance authorization.  Follow Up Recommendations       Assistance Recommended at Discharge    Patient can return home with the following  A little help with walking and/or transfers    Equipment Recommendations None recommended by PT  Recommendations for Other Services       Functional Status Assessment Patient has had a recent decline in their functional status and demonstrates the ability to make significant improvements in function in a reasonable and predictable amount of time.     Precautions / Restrictions Precautions Precautions: Fall      Mobility  Bed Mobility Overal bed mobility: Needs Assistance Bed Mobility: Supine to Sit     Supine to sit: Min assist          Transfers Overall transfer level: Needs assistance Equipment used: Rolling walker (2 wheels) Transfers: Sit to/from Stand Sit to Stand: Min assist                Ambulation/Gait Ambulation/Gait  assistance: Min Chemical engineer (Feet): 2 Feet Assistive device: Rolling walker (2 wheels) Gait Pattern/deviations: Decreased step length - right, Decreased step length - left   Gait velocity interpretation: <1.31 ft/sec, indicative of household ambulator        Balance                                             Pertinent Vitals/Pain Pain Assessment Pain Assessment: No/denies pain    Home Living Family/patient expects to be discharged to:: Skilled nursing facility Living Arrangements: Children;Other relatives                      Prior Function Prior Level of Function : Needs assist             Mobility Comments: household distances with RW and aide present if assistance needed ADLs Comments: has home aides 8 hours/day x 5 days/week, family assist also     Hand Dominance   Dominant Hand: Right    Extremity/Trunk Assessment        Lower Extremity Assessment Lower Extremity Assessment: Generalized weakness       Communication   Communication: No difficulties  Cognition Arousal/Alertness: Awake/alert   Overall Cognitive Status: Within Functional Limits for tasks assessed  General Comments      Exercises     Assessment/Plan    PT Assessment Patient needs continued PT services  PT Problem List Decreased strength;Decreased activity tolerance;Decreased mobility       PT Treatment Interventions Gait training;Therapeutic exercise    PT Goals (Current goals can be found in the Care Plan section)  Acute Rehab PT Goals Patient Stated Goal: to go home.  Daughter's state that she will not be able to go home if she can not walk from her bedroom down the hall PT Goal Formulation: With patient/family Time For Goal Achievement: 05/07/22 Potential to Achieve Goals: Good    Frequency Min 3X/week     Co-evaluation               AM-PAC PT "6 Clicks" Mobility   Outcome Measure Help needed turning from your back to your side while in a flat bed without using bedrails?: A Little Help needed moving from lying on your back to sitting on the side of a flat bed without using bedrails?: A Little Help needed moving to and from a bed to a chair (including a wheelchair)?: A Little Help needed standing up from a chair using your arms (e.g., wheelchair or bedside chair)?: A Little Help needed to walk in hospital room?: A Lot Help needed climbing 3-5 steps with a railing? : Total 6 Click Score: 15    End of Session Equipment Utilized During Treatment: Gait belt Activity Tolerance: Patient limited by fatigue Patient left: in chair;with call bell/phone within reach;with family/visitor present;with chair alarm set   PT Visit Diagnosis: Unsteadiness on feet (R26.81);Muscle weakness (generalized) (M62.81);Difficulty in walking, not elsewhere classified (R26.2)    Time: 1140-1200 PT Time Calculation (min) (ACUTE ONLY): 20 min   Charges:   PT Evaluation $PT Eval Low Complexity: 1 Low          Virgina Organ, PT CLT [phone]  04/24/2022, 12:13 PM

## 2022-04-24 NOTE — Plan of Care (Signed)
  Problem: Acute Rehab PT Goals(only PT should resolve) Goal: Pt Will Go Supine/Side To Sit Flowsheets (Taken 04/24/2022 1207) Pt will go Supine/Side to Sit: with modified independence Goal: Pt Will Go Sit To Supine/Side Flowsheets (Taken 04/24/2022 1207) Pt will go Sit to Supine/Side: with modified independence Goal: Patient Will Transfer Sit To/From Stand Flowsheets (Taken 04/24/2022 1207) Patient will transfer sit to/from stand: with supervision Goal: Pt Will Ambulate Flowsheets (Taken 04/24/2022 1207) Pt will Ambulate:  25 feet  with minimal assist  with rolling walker

## 2022-04-24 NOTE — Care Management Obs Status (Signed)
MEDICARE OBSERVATION STATUS NOTIFICATION   Patient Details  Name: [name] MRN: [id number] Date of Birth: 1934-09-06   Medicare Observation Status Notification Given:  Yes    Kanyia Heaslip Marsh Dolly, LCSW 04/24/2022, 4:08 PM

## 2022-04-24 NOTE — Progress Notes (Addendum)
PROGRESS NOTE    [name]  BJY:[id number] DOB: Nov 12, 1934 DOA: 04/23/2022 PCP: Marylynn Pearson, FNP    Brief Narrative:  [name] is a 88 y.o. female with medical history significant of heart failure with preserved EF, COPD, IBS, pulmonary fibrosis, hypertension, dementia.  Patient has a caregiver 8 hours a day.  Her daughter is her guardian due to her dementia.  Her daughter provides much of the history.  The patient has had decreased in function from baseline over the past 3 days.  She has been sleeping a lot and very unsteady on her feet.  Normally she is able to walk with a walker with minimal assistance and aid in getting to the bathroom and changing her depends.  Over the past 3 days, she is needed a lot of assistance when using her walker.  She is almost fell a few times using her walker due to imbalance.  She has been more short of breath and coughing with sputum production.  Her daughter called her pulmonologist, who prescribed her Augmentin and prednisone.  She took her first dose this morning.  Denies fevers, chills, chest pain.  Her appetite has been greatly diminished.     Assessment and Plan: Acute respiratory failure from either COPD exacerbation vs acute on chronic diastolic CHF -says she does not wear O2 at home -wean as able -r/o viruses with NP swab -Will continue with prednisone and Augmentin. -Continue DuoNeb 4 times daily -Continue Breo inhaler -Strict I/O and Daily Weights -Diuresis: 20 mg IV twice daily -echo  Dementia -continue home meds  PT/OT evaluation   DVT prophylaxis: enoxaparin (LOVENOX) injection 30 mg Start: 04/23/22 2000    Code Status: DNR   Disposition Plan:  Level of care: Telemetry Status is: Observation The patient will require care spanning > 2 midnights and should be moved to inpatient because: needs weaned off O2    Consultants:  none   Subjective: + cough, no fever , no chills- says she does not wear O2 at  home  Objective: Vitals:   04/24/22 0500 04/24/22 0526 04/24/22 0900 04/24/22 0903  BP: 138/83     Pulse: 94     Resp: 20     Temp: 98.2 F (36.8 C)     TempSrc: Oral     SpO2: 93%  93% 98%  Weight:  50.4 kg    Height:        Intake/Output Summary (Last 24 hours) at 04/24/2022 1141 Last data filed at 04/24/2022 1100 Gross per 24 hour  Intake 240 ml  Output 700 ml  Net -460 ml   Filed Weights   04/23/22 1023 04/23/22 1600 04/24/22 0526  Weight: 47.6 kg 50.4 kg 50.4 kg    Examination:   General: Appearance:    Elderly female in no acute distress     Lungs:     On [place]  Heart:    Normal heart rate.    MS:   All extremities are intact.    Neurologic:   Awake, alert       Data Reviewed: I have personally reviewed following labs and imaging studies  CBC: Recent Labs  Lab 04/23/22 1020  WBC 5.7  NEUTROABS 4.9  HGB 10.8*  HCT 34.5*  MCV 91.3  PLT 209   Basic Metabolic Panel: Recent Labs  Lab 04/23/22 1020 04/24/22 0338  NA 132* 134*  K 4.6 4.3  CL 96* 97*  CO2 23 26  GLUCOSE 111* 93  BUN 22  24*  CREATININE 1.12* 1.10*  CALCIUM 8.8* 8.6*   GFR: Estimated Creatinine Clearance: 24.7 mL/min (A) (by C-G formula based on SCr of 1.1 mg/dL (H)). Liver Function Tests: Recent Labs  Lab 04/23/22 1020  AST 31  ALT 18  ALKPHOS 92  BILITOT 0.4  PROT 6.9  ALBUMIN 3.4*   Recent Labs  Lab 04/23/22 1020  LIPASE 37   No results for input(s): "AMMONIA" in the last 168 hours. Coagulation Profile: No results for input(s): "INR", "PROTIME" in the last 168 hours. Cardiac Enzymes: No results for input(s): "CKTOTAL", "CKMB", "CKMBINDEX", "TROPONINI" in the last 168 hours. BNP (last 3 results) No results for input(s): "PROBNP" in the last 8760 hours. HbA1C: No results for input(s): "HGBA1C" in the last 72 hours. CBG: No results for input(s): "GLUCAP" in the last 168 hours. Lipid Profile: No results for input(s): "CHOL", "HDL", "LDLCALC", "TRIG", "CHOLHDL",  "LDLDIRECT" in the last 72 hours. Thyroid Function Tests: No results for input(s): "TSH", "T4TOTAL", "FREET4", "T3FREE", "THYROIDAB" in the last 72 hours. Anemia Panel: No results for input(s): "VITAMINB12", "FOLATE", "FERRITIN", "TIBC", "IRON", "RETICCTPCT" in the last 72 hours. Sepsis Labs: No results for input(s): "PROCALCITON", "LATICACIDVEN" in the last 168 hours.  Recent Results (from the past 240 hour(s))  SARS Coronavirus 2 by RT PCR (hospital order, performed in [facility] hospital lab) *cepheid single result test* Anterior Nasal Swab     Status: None   Collection Time: 04/23/22 10:31 AM   Specimen: Anterior Nasal Swab  Result Value Ref Range Status   SARS Coronavirus 2 by RT PCR NEGATIVE NEGATIVE Final    Comment: (NOTE) SARS-CoV-2 target nucleic acids are NOT DETECTED.  The SARS-CoV-2 RNA is generally detectable in upper and lower respiratory specimens during the acute phase of infection. The lowest concentration of SARS-CoV-2 viral copies this assay can detect is 250 copies / mL. A negative result does not preclude SARS-CoV-2 infection and should not be used as the sole basis for treatment or other patient management decisions.  A negative result may occur with improper specimen collection / handling, submission of specimen other than nasopharyngeal swab, presence of viral mutation(s) within the areas targeted by this assay, and inadequate number of viral copies (<250 copies / mL). A negative result must be combined with clinical observations, patient history, and epidemiological information.  Fact Sheet for Patients:   RoadLapTop.co.za  Fact Sheet for Healthcare Providers: http://kim-miller.com/  This test is not yet approved or  cleared by the [place] FDA and has been authorized for detection and/or diagnosis of SARS-CoV-2 by FDA under an Emergency Use Authorization (EUA).  This EUA will remain in effect (meaning  this test can be used) for the duration of the COVID-19 declaration under Section 564(b)(1) of the Act, 21 U.S.C. section 360bbb-3(b)(1), unless the authorization is terminated or revoked sooner.  Performed at [facility], [address], [place], [place] [address]          Radiology Studies: CT Head Wo Contrast  Result Date: 04/23/2022 CLINICAL DATA:  Headache. EXAM: CT HEAD WITHOUT CONTRAST TECHNIQUE: Contiguous axial images were obtained from the base of the skull through the vertex without intravenous contrast. RADIATION DOSE REDUCTION: This exam was performed according to the departmental dose-optimization program which includes automated exposure control, adjustment of the mA and/or kV according to patient size and/or use of iterative reconstruction technique. COMPARISON:  Head CT 09/24/2021 FINDINGS: Brain: There is no evidence of an acute infarct, intracranial hemorrhage, mass, midline shift, or extra-axial fluid  collection. Patchy to confluent hypodensities in the cerebral white matter bilaterally are similar to the prior study and are nonspecific but compatible with extensive chronic small vessel ischemic disease. There is mild cerebral atrophy. Vascular: Calcified atherosclerosis at the skull base. No hyperdense vessel. Skull: No fracture or suspicious osseous lesion. Advanced right TMJ arthropathy. Resorption or postsurgical deficiency of the left mandibular condyle. Sinuses/Orbits: Minimal mucosal thickening in the paranasal sinuses. Increased right mastoid effusion with surrounding sclerosis. Increased right middle ear opacification. Unremarkable orbits. Other: Partially visualized complex superficial structure containing gas in the posterolateral left upper neck, also present previously. IMPRESSION: 1. No evidence of acute intracranial abnormality. 2. Extensive chronic small vessel ischemic disease. 3. Chronic right mastoid and middle ear effusions. Electronically Signed   By: Sebastian Ache M.D.   On: 04/23/2022 14:28   CT Angio Chest PE W/Cm &/Or Wo Cm  Result Date: 04/23/2022 CLINICAL DATA:  Pulmonary embolism (PE) suspected, high prob EXAM: CT ANGIOGRAPHY CHEST WITH CONTRAST TECHNIQUE: Multidetector CT imaging of the chest was performed using the standard protocol during bolus administration of intravenous contrast. Multiplanar CT image reconstructions and MIPs were obtained to evaluate the vascular anatomy. RADIATION DOSE REDUCTION: This exam was performed according to the departmental dose-optimization program which includes automated exposure control, adjustment of the mA and/or kV according to patient size and/or use of iterative reconstruction technique. CONTRAST:  75mL OMNIPAQUE IOHEXOL 350 MG/ML SOLN COMPARISON:  04/19/2022 FINDINGS: Cardiovascular: Satisfactory opacification of the pulmonary arteries to the segmental level. No evidence of pulmonary embolism. Tortuosity of the thoracic aorta without aneurysmal dilation. Atherosclerotic calcifications of the aorta and coronary arteries. Normal heart size. Small pericardial effusion. Mediastinum/Nodes: Large hiatal hernia containing the majority of the stomach within the chest. Prominent mediastinal lymph nodes including 1.4 cm AP window node (series 5, image 110). No axillary or hilar lymphadenopathy. Trachea within normal limits. Lungs/Pleura: Findings of basilar predominant pulmonary fibrosis. Trace left pleural effusion. Previously described irregular nodule in the left lower lobe appears less solid on the current study. No pneumothorax. Upper Abdomen: Left-sided nephrolithiasis with partially imaged bilateral hydronephrosis. Musculoskeletal: Severe thoracic kyphosis with numerous thoracic vertebral body compression fractures. No new or progressive fracture is seen compared to the recent prior CT. Review of the MIP images confirms the above findings. IMPRESSION: 1. No evidence of pulmonary embolism. 2. Chronic findings of  pulmonary fibrosis. 3. Previously described irregular nodule in the left lower lobe appears less solid on the current study. 4. Trace left pleural effusion. 5. Large hiatal hernia containing the majority of the stomach within the chest. 6. Left-sided nephrolithiasis with partially imaged bilateral hydronephrosis. 7. Severe thoracic kyphosis with numerous thoracic vertebral body compression fractures. No new or progressive fracture is seen compared to the recent prior CT. 8. Aortic and coronary artery atherosclerosis (ICD10-I70.0). Electronically Signed   By: Duanne Guess D.O.   On: 04/23/2022 14:21   DG Chest Port 1 View  Result Date: 04/23/2022 CLINICAL DATA:  Weakness. EXAM: PORTABLE CHEST 1 VIEW COMPARISON:  03/25/2022 FINDINGS: Patient is rotated to the right. Lungs are adequately inflated with mild opacification over the right suprahilar region likely prominent vasculature although cannot exclude a pulmonary parenchymal process such as infection. There is evidence of mild vascular congestion. Cardiomediastinal silhouette is otherwise unchanged. Evidence of known moderate size hiatal hernia. Remainder of the exam is unchanged. IMPRESSION: 1. Mild opacification over the right suprahilar region likely prominent vasculature, although cannot exclude a pulmonary parenchymal process such as infection. 2. Mild vascular  congestion. Electronically Signed   By: Elberta Fortis M.D.   On: 04/23/2022 11:08        Scheduled Meds:  ALPRAZolam  0.5 mg Oral BID   amoxicillin-clavulanate  1 tablet Oral BID   diclofenac Sodium  4 g Topical QID   donepezil  10 mg Oral q morning   enoxaparin (LOVENOX) injection  30 mg Subcutaneous Q24H   fluticasone furoate-vilanterol  1 puff Inhalation Daily   furosemide  20 mg Intravenous BID   ipratropium-albuterol  3 mL Nebulization QID   loratadine  10 mg Oral q morning   memantine  10 mg Oral BID   pantoprazole  40 mg Oral BID   potassium chloride  10 mEq Oral Daily    [START ON 04/28/2022] predniSONE  10 mg Oral Q breakfast   [START ON 04/26/2022] predniSONE  20 mg Oral Q breakfast   predniSONE  30 mg Oral Q breakfast   simvastatin  5 mg Oral Daily   zolpidem  5 mg Oral QHS   Continuous Infusions:   LOS: 0 days    Time spent: 45 minutes spent on chart review, discussion with nursing staff, consultants, updating family and interview/physical exam; more than 50% of that time was spent in counseling and/or coordination of care.    Joseph Art, DO [facility] Available via Epic secure chat 7am-7pm After these hours, please refer to coverage provider listed on [email or web address] 04/24/2022, 11:41 AM

## 2022-04-24 NOTE — TOC Initial Note (Addendum)
Transition of Care [facility]) - Initial/Assessment Note    Patient Details  Name: [name] MRN: [id number] Date of Birth: 11/06/1934  Transition of Care (TOC) CM/SW Contact:    Catalina Gravel, LCSW Phone Number: 04/24/2022, 3:35 PM  Clinical Narrative:                 .. Transition of Care [facility]) Screening Note   Patient Details  Name: [name] Date of Birth: 29-Jan-1934   Transition of Care (TOC) CM/SW Contact:    Catalina Gravel, LCSW Phone Number: 04/24/2022, 3:36 PM    Transition of Care Department [facility]) has reviewed patient and no TOC needs have been identified at this time. We will continue to monitor patient advancement through interdisciplinary progression rounds. If new patient transition needs arise, please place a TOC consult.     Barriers to Discharge: Continued Medical Work up   Patient Goals and CMS Choice            Expected Discharge Plan and Services                                              Prior Living Arrangements/Services                       Activities of Daily Living Home Assistive Devices/Equipment: Wheelchair ADL Screening (condition at time of admission) Patient's cognitive ability adequate to safely complete daily activities?: Yes Is the patient deaf or have difficulty hearing?: Yes Does the patient have difficulty seeing, even when wearing glasses/contacts?: No Does the patient have difficulty concentrating, remembering, or making decisions?: No Patient able to express need for assistance with ADLs?: Yes Does the patient have difficulty dressing or bathing?: Yes Independently performs ADLs?: Yes (appropriate for developmental age) Does the patient have difficulty walking or climbing stairs?: Yes Weakness of Legs: Both Weakness of Arms/Hands: Both  Permission Sought/Granted                  Emotional Assessment              Admission diagnosis:  Hypoxia [R09.02] Patient Active  Problem List   Diagnosis Date Noted   Hypoxia 04/23/2022   Anemia, unspecified 11/23/2021   Long term (current) use of inhaled steroids 11/23/2021   Unspecified dementia, unspecified severity, with mood disturbance 11/23/2021   Fracture of femoral neck, left, closed 09/24/2021   Acute respiratory failure with hypoxia 06/10/2021   Tobacco use disorder 06/10/2021   Generalized weakness 06/10/2021   Bilateral conjunctivitis 06/10/2021   Chronic diastolic CHF (congestive heart failure) 05/14/2021   Depression 05/14/2021   Hyperlipidemia    COPD exacerbation 05/13/2021   Long term (current) use of opiate analgesic 01/04/2021   Chronic pain 04/23/2019   General unsteadiness 04/23/2019   Polyarthropathy 04/23/2019   Pseudobulbar affect 04/23/2019   Tremor 04/23/2019   Chronic obstructive pulmonary disease, unspecified    Gastro-esophageal reflux disease without esophagitis    Hyperlipidemia, unspecified    Pulmonary fibrosis, unspecified    Acute on chronic diastolic (congestive) heart failure    Age-related osteoporosis without current pathological fracture    Anxiety disorder, unspecified    Chest pain, unspecified    Chronic diastolic (congestive) heart failure    Collapsed vertebra, not elsewhere classified, site unspecified, subsequent encounter for fracture with routine healing  Edema, unspecified    Lumbago with sciatica    Overactive bladder    Pain in unspecified hand    Pressure ulcer of unspecified site, unspecified stage    Unspecified protein-calorie malnutrition    Acute diastolic heart failure 09/30/2018   Dyspnea 09/29/2018   Pneumonia 06/30/2018   Acute CHF (congestive heart failure) 06/29/2018   Vascular dementia without behavioral disturbance 06/26/2018   CAP (community acquired pneumonia) 06/23/2018   Bilateral pleural effusion 06/23/2018   Leukocytosis 06/20/2018   Chronic anemia 06/20/2018   Dementia 06/19/2018   Bilateral lower extremity edema  06/18/2018   Protein-calorie malnutrition, severe 06/18/2018   S/P internal fixation right hip fracture cannulated screw placement 04/06/18 04/27/2018   Urinary tract infection associated with catheterization of urinary tract 04/11/2018   Altered mental status 04/10/2018   Anxiety 04/10/2018   Hyponatremia 04/10/2018   UTI (urinary tract infection) due to urinary indwelling catheter 04/10/2018   Chronic constipation 04/09/2018   Urinary retention 04/09/2018   Closed right hip fracture 04/05/2018   GERD (gastroesophageal reflux disease) 04/05/2018   COPD with acute exacerbation 04/05/2018   Hiatal hernia    Dyspepsia 04/04/2014   Hematochezia 04/30/2011   RUQ pain 03/25/2011   Adenomatous polyps 03/25/2011   ABDOMINAL PAIN, LEFT LOWER QUADRANT 10/16/2009   HYPERLIPIDEMIA 10/09/2009   BRONCHITIS 10/09/2009   Osteoporosis 10/09/2009   Personal history of pneumonia (recurrent) 01/05/1999   Mixed hyperlipidemia 01/05/1999   PCP:  Marylynn Pearson, FNP Pharmacy:   [facility] - [place], [place] - [address] [place] [address] Phone: [phone] Fax: [phone]  [facility] - [address] [place], [place] - [address] [place] [address] Phone: [phone] Fax: [phone]  [id number] - [place], [place] - [address] AT [facility] [address] [place] [address] Phone: [phone] Fax: [phone]     Social Determinants of Health (SDOH) Social History: SDOH Screenings   Food Insecurity: No Food Insecurity (04/23/2022)  Housing: Low Risk  (04/23/2022)  Transportation Needs: No Transportation Needs (04/23/2022)  Utilities: Not At Risk (04/23/2022)  Tobacco Use: High Risk (04/23/2022)   SDOH Interventions:     Readmission Risk Interventions    09/30/2021    9:22 AM 06/12/2021   12:11 PM  Readmission Risk Prevention Plan  Transportation Screening  Complete Complete  PCP or Specialist Appt within 3-5 Days  Complete  HRI or Home Care Consult  Complete  Social Work Consult for Recovery Care Planning/Counseling  Complete  Palliative Care Screening  Not Applicable  Medication Review Oceanographer) Complete Complete  HRI or Home Care Consult Complete   SW Recovery Care/Counseling Consult Complete   Palliative Care Screening Not Applicable   Skilled Nursing Facility Complete

## 2022-04-24 NOTE — Progress Notes (Signed)
   04/24/22 1100  ReDS Vest / Clip  Station Marker B  Ruler Value 30  ReDS Value Range < 36  ReDS Actual Value 34

## 2022-04-24 NOTE — Progress Notes (Signed)
  Echocardiogram 2D Echocardiogram has been performed.  Janalyn Harder 04/24/2022, 11:22 AM

## 2022-04-25 ENCOUNTER — Inpatient Hospital Stay (HOSPITAL_COMMUNITY): Payer: Medicare Other

## 2022-04-25 DIAGNOSIS — R0902 Hypoxemia: Secondary | ICD-10-CM | POA: Diagnosis not present

## 2022-04-25 LAB — BASIC METABOLIC PANEL
Anion gap: 10 (ref 5–15)
BUN: 31 mg/dL — ABNORMAL HIGH (ref 8–23)
CO2: 27 mmol/L (ref 22–32)
Calcium: 8.6 mg/dL — ABNORMAL LOW (ref 8.9–10.3)
Chloride: 98 mmol/L (ref 98–111)
Creatinine, Ser: 1.36 mg/dL — ABNORMAL HIGH (ref 0.44–1.00)
GFR, Estimated: 38 mL/min — ABNORMAL LOW (ref >60)
Glucose, Bld: 94 mg/dL (ref 70–99)
Potassium: 4.1 mmol/L (ref 3.5–5.1)
Sodium: 135 mmol/L (ref 135–145)

## 2022-04-25 MED ORDER — ALBUTEROL SULFATE (2.5 MG/3ML) 0.083% IN NEBU: 2.5000 mg | INHALATION_SOLUTION | RESPIRATORY_TRACT | Status: DC | PRN

## 2022-04-25 MED ORDER — IPRATROPIUM-ALBUTEROL 0.5-2.5 (3) MG/3ML IN SOLN
3.0000 mL | Freq: Four times a day (QID) | RESPIRATORY_TRACT | Status: DC
  Administered 2022-04-25 (×2): 3 mL via RESPIRATORY_TRACT
  Filled 2022-04-25 (×2): qty 3

## 2022-04-25 MED ORDER — OSELTAMIVIR PHOSPHATE 30 MG PO CAPS
30.0000 mg | ORAL_CAPSULE | Freq: Every day | ORAL | Status: AC
  Administered 2022-04-25 – 2022-04-29 (×5): 30 mg via ORAL
  Filled 2022-04-25 (×6): qty 1

## 2022-04-25 MED ORDER — ACETAMINOPHEN 325 MG PO TABS
650.0000 mg | ORAL_TABLET | Freq: Four times a day (QID) | ORAL | Status: DC | PRN
  Administered 2022-04-28 – 2022-04-29 (×2): 650 mg via ORAL
  Filled 2022-04-25 (×2): qty 2

## 2022-04-25 MED ORDER — PREDNISONE 20 MG PO TABS
40.0000 mg | ORAL_TABLET | Freq: Every day | ORAL | Status: DC
  Administered 2022-04-26 – 2022-04-30 (×5): 40 mg via ORAL
  Filled 2022-04-25 (×5): qty 2

## 2022-04-25 MED ORDER — SODIUM CHLORIDE 0.9 % IV SOLN: INTRAVENOUS | Status: DC

## 2022-04-25 MED ORDER — SODIUM CHLORIDE 0.9 % IV SOLN: INTRAVENOUS | Status: AC

## 2022-04-25 MED ORDER — IPRATROPIUM-ALBUTEROL 0.5-2.5 (3) MG/3ML IN SOLN
3.0000 mL | Freq: Three times a day (TID) | RESPIRATORY_TRACT | Status: DC
  Administered 2022-04-26 – 2022-04-30 (×12): 3 mL via RESPIRATORY_TRACT
  Filled 2022-04-25 (×13): qty 3

## 2022-04-25 NOTE — Plan of Care (Signed)

## 2022-04-25 NOTE — Progress Notes (Signed)
Pt assisted up to chair/recliner after bath. Pt bears weight well but only takes shuffling steps and has to be reminded to stand up straight. SaO2 96% on 3lpm Green prior to activity and remained 96% with and after activity. Pt requested and was given pain medication for "crick in neck". Tolerates po meds well. Chair alarm on for safety, call bell within reach. Advised to call for needs.

## 2022-04-25 NOTE — Progress Notes (Signed)
Pt has wound on left medial foot/bunion area that she has been seeing wound care MD for for at least 6 months. Dressing change is M-W-F but daughter states not changed since this past Wednesday. Wound 1cm x 1.5cm, scabbed area. Wound care per wound care MD note is clean with soap/water, apply small piece of "endoform" and cover with small square silicone foam dressing. Daughter changed bandage today after I assessed the wound. Wound is clean, no drainage. MD Benjamine Mola notified of wound and need for wound care orders.

## 2022-04-25 NOTE — Progress Notes (Signed)
PROGRESS NOTE    [name]  WUJ:[id number] DOB: May 19, 1934 DOA: 04/23/2022 PCP: Marylynn Pearson, FNP    Brief Narrative:  [name] is a 88 y.o. female with medical history significant of heart failure with preserved EF, COPD, IBS, pulmonary fibrosis, hypertension, dementia.  Patient has a caregiver 8 hours a day.  Her daughter is her guardian due to her dementia.  Her daughter provides much of the history.  The patient has had decreased in function from baseline over the past 3 days.  She has been sleeping a lot and very unsteady on her feet.  Normally she is able to walk with a walker with minimal assistance and aid in getting to the bathroom and changing her depends.  Over the past 3 days, she is needed a lot of assistance when using her walker.  She is almost fell a few times using her walker due to imbalance.  She has been more short of breath and coughing with sputum production.  Her daughter called her pulmonologist, who prescribed her Augmentin and prednisone.  She took her first dose this morning.  Denies fevers, chills, chest pain.  Her appetite has been greatly diminished.     Assessment and Plan:  Influenza A -tamiflu started 4/21 -supportive care  Acute respiratory failure from flu plus/minus COPD exacerbation -says she does not wear O2 at home -wean as able -Will continue with prednisone 40 mg and Augmentin. -Continue DuoNeb 4 times daily -Continue Breo inhaler -Strict I/O and Daily Weights -hold on diuresis as BP trending down and developing increased HR -x rays shows chronic fibrotic process  Dementia -continue home meds  AKI -could be from over-diuresis   Overall poor prognosis-- currently DNR-- may need to involve palliative care   DVT prophylaxis: enoxaparin (LOVENOX) injection 30 mg Start: 04/23/22 2000    Code Status: DNR   Disposition Plan:  Level of care: Med-Surg Status is: inpt    Consultants:  none   Subjective: C/o cramp in her  left shoulder/neck  Objective: Vitals:   04/24/22 2058 04/25/22 0459 04/25/22 0814 04/25/22 0825  BP: (!) 99/58 106/76    Pulse: 85 82    Resp: 20 20    Temp: 98.3 F (36.8 C) 98.3 F (36.8 C)    TempSrc: Oral Oral    SpO2: 93% 94% 91% 96%  Weight:  47.6 kg    Height:        Intake/Output Summary (Last 24 hours) at 04/25/2022 1051 Last data filed at 04/25/2022 0900 Gross per 24 hour  Intake 540 ml  Output 400 ml  Net 140 ml   Filed Weights   04/23/22 1600 04/24/22 0526 04/25/22 0459  Weight: 50.4 kg 50.4 kg 47.6 kg    Examination:    General: Appearance:    elderly female      Lungs:     On , mildly increased work of breathing  Heart:    Normal heart rate. .   MS:   All extremities are intact.   Neurologic:   Awake, alert       Data Reviewed: I have personally reviewed following labs and imaging studies  CBC: Recent Labs  Lab 04/23/22 1020  WBC 5.7  NEUTROABS 4.9  HGB 10.8*  HCT 34.5*  MCV 91.3  PLT 209   Basic Metabolic Panel: Recent Labs  Lab 04/23/22 1020 04/24/22 0338 04/25/22 0523  NA 132* 134* 135  K 4.6 4.3 4.1  CL 96* 97* 98  CO2  GLUCOSE 111* 93 94  BUN 22 24* 31*  CREATININE 1.12* 1.10* 1.36*  CALCIUM 8.8* 8.6* 8.6*   GFR: Estimated Creatinine Clearance: 19.5 mL/min (A) (by C-G formula based on SCr of 1.36 mg/dL (H)). Liver Function Tests: Recent Labs  Lab 04/23/22 1020  AST 31  ALT 18  ALKPHOS 92  BILITOT 0.4  PROT 6.9  ALBUMIN 3.4*   Recent Labs  Lab 04/23/22 1020  LIPASE 37   No results for input(s): "AMMONIA" in the last 168 hours. Coagulation Profile: No results for input(s): "INR", "PROTIME" in the last 168 hours. Cardiac Enzymes: No results for input(s): "CKTOTAL", "CKMB", "CKMBINDEX", "TROPONINI" in the last 168 hours. BNP (last 3 results) No results for input(s): "PROBNP" in the last 8760 hours. HbA1C: No results for input(s): "HGBA1C" in the last 72 hours. CBG: No results for input(s):  "GLUCAP" in the last 168 hours. Lipid Profile: No results for input(s): "CHOL", "HDL", "LDLCALC", "TRIG", "CHOLHDL", "LDLDIRECT" in the last 72 hours. Thyroid Function Tests: No results for input(s): "TSH", "T4TOTAL", "FREET4", "T3FREE", "THYROIDAB" in the last 72 hours. Anemia Panel: No results for input(s): "VITAMINB12", "FOLATE", "FERRITIN", "TIBC", "IRON", "RETICCTPCT" in the last 72 hours. Sepsis Labs: No results for input(s): "PROCALCITON", "LATICACIDVEN" in the last 168 hours.  Recent Results (from the past 240 hour(s))  SARS Coronavirus 2 by RT PCR (hospital order, performed in [facility] hospital lab) *cepheid single result test* Anterior Nasal Swab     Status: None   Collection Time: 04/23/22 10:31 AM   Specimen: Anterior Nasal Swab  Result Value Ref Range Status   SARS Coronavirus 2 by RT PCR NEGATIVE NEGATIVE Final    Comment: (NOTE) SARS-CoV-2 target nucleic acids are NOT DETECTED.  The SARS-CoV-2 RNA is generally detectable in upper and lower respiratory specimens during the acute phase of infection. The lowest concentration of SARS-CoV-2 viral copies this assay can detect is 250 copies / mL. A negative result does not preclude SARS-CoV-2 infection and should not be used as the sole basis for treatment or other patient management decisions.  A negative result may occur with improper specimen collection / handling, submission of specimen other than nasopharyngeal swab, presence of viral mutation(s) within the areas targeted by this assay, and inadequate number of viral copies (<250 copies / mL). A negative result must be combined with clinical observations, patient history, and epidemiological information.  Fact Sheet for Patients:   RoadLapTop.co.za  Fact Sheet for Healthcare Providers: http://kim-miller.com/  This test is not yet approved or  cleared by the [place] FDA and has been authorized for detection and/or  diagnosis of SARS-CoV-2 by FDA under an Emergency Use Authorization (EUA).  This EUA will remain in effect (meaning this test can be used) for the duration of the COVID-19 declaration under Section 564(b)(1) of the Act, 21 U.S.C. section 360bbb-3(b)(1), unless the authorization is terminated or revoked sooner.  Performed at [facility], [address], [place], [place] [address]   Respiratory (~20 pathogens) panel by PCR     Status: Abnormal   Collection Time: 04/24/22 11:20 AM   Specimen: Nasopharyngeal Swab; Respiratory  Result Value Ref Range Status   Adenovirus NOT DETECTED NOT DETECTED Final   Coronavirus 229E NOT DETECTED NOT DETECTED Final    Comment: (NOTE) The Coronavirus on the Respiratory Panel, DOES NOT test for the novel  Coronavirus (2019 nCoV)    Coronavirus HKU1 NOT DETECTED NOT DETECTED Final   Coronavirus NL63 NOT DETECTED NOT DETECTED  Final   Coronavirus OC43 NOT DETECTED NOT DETECTED Final   Metapneumovirus NOT DETECTED NOT DETECTED Final   Rhinovirus / Enterovirus NOT DETECTED NOT DETECTED Final   Influenza A H3 DETECTED (A) NOT DETECTED Final   Influenza B NOT DETECTED NOT DETECTED Final   Parainfluenza Virus 1 NOT DETECTED NOT DETECTED Final   Parainfluenza Virus 2 NOT DETECTED NOT DETECTED Final   Parainfluenza Virus 3 NOT DETECTED NOT DETECTED Final   Parainfluenza Virus 4 NOT DETECTED NOT DETECTED Final   Respiratory Syncytial Virus NOT DETECTED NOT DETECTED Final   Bordetella pertussis NOT DETECTED NOT DETECTED Final   Bordetella Parapertussis NOT DETECTED NOT DETECTED Final   Chlamydophila pneumoniae NOT DETECTED NOT DETECTED Final   Mycoplasma pneumoniae NOT DETECTED NOT DETECTED Final    Comment: Performed at [facility], [address], [place], [place] [address]         Radiology Studies: DG CHEST PORT 1 VIEW  Result Date: 04/25/2022 CLINICAL DATA:  Hypoxia EXAM: PORTABLE CHEST 1 VIEW COMPARISON:  04/23/2022 FINDINGS: Patient is  rotated. Stable heart size. Large hiatal hernia. Aortic atherosclerosis. Chronic fibrotic lung changes bilaterally. Slightly increased streaky right basilar opacity favors atelectasis. No large pleural fluid collection. No pneumothorax. Numerous chronic bilateral rib deformities. IMPRESSION: 1. Slightly increased streaky right basilar opacity favors atelectasis. 2. Large hiatal hernia. Electronically Signed   By: Duanne Guess D.O.   On: 04/25/2022 09:42   ECHOCARDIOGRAM COMPLETE  Result Date: 04/24/2022    ECHOCARDIOGRAM REPORT   Patient Name:   HAPPY BEGEMAN Date of Exam: 04/24/2022 Medical Rec #:  [id number]     Height:       56.0 in Accession #:    [id number]    Weight:       111.1 lb Date of Birth:  December 01, 1934     BSA:          1.383 m Patient Age:    87 years      BP:           138/83 mmHg Patient Gender: F             HR:           90 bpm. [facility] Procedure: 2D Echo, Cardiac Doppler and Color Doppler Indications:    I50.40* Unspecified combined systolic (congestive) and diastolic                 (congestive) heart failure  History:        Patient has prior history of Echocardiogram examinations, most                 recent 09/30/2018. COPD, Signs/Symptoms:Shortness of Breath,                 Dyspnea, Chest Pain, Alzheimer's and Altered Mental Status; Risk                 Factors:Hypertension.  Sonographer:    Sheralyn Boatman RDCS Referring Phys: [phone] JACOB J STINSON IMPRESSIONS  1. Left ventricular ejection fraction, by estimation, is 55 to 60%. The left ventricle has normal function. The left ventricle has no regional wall motion abnormalities. There is moderate asymmetric left ventricular hypertrophy of the basal-septal segment. Left ventricular diastolic parameters are consistent with Grade I diastolic dysfunction (impaired relaxation).  2. Right ventricular systolic function is normal. The right ventricular size is normal. There is normal pulmonary artery systolic pressure. The estimated  right ventricular systolic  pressure is 31.5 mmHg.  3. Left atrial size was severely dilated.  4. The mitral valve is degenerative. Trivial mitral valve regurgitation. No evidence of mitral stenosis.  5. The aortic valve is abnormal. There is mild calcification of the aortic valve. There is mild thickening of the aortic valve. Aortic valve regurgitation is not visualized. Aortic valve sclerosis/calcification is present, without any evidence of aortic  stenosis.  6. The inferior vena cava is normal in size with greater than 50% respiratory variability, suggesting right atrial pressure of 3 mmHg. FINDINGS  Left Ventricle: Left ventricular ejection fraction, by estimation, is 55 to 60%. The left ventricle has normal function. The left ventricle has no regional wall motion abnormalities. The left ventricular internal cavity size was normal in size. There is  moderate asymmetric left ventricular hypertrophy of the basal-septal segment. Left ventricular diastolic parameters are consistent with Grade I diastolic dysfunction (impaired relaxation). Right Ventricle: The right ventricular size is normal. No increase in right ventricular wall thickness. Right ventricular systolic function is normal. There is normal pulmonary artery systolic pressure. The tricuspid regurgitant velocity is 2.67 m/s, and  with an assumed right atrial pressure of 3 mmHg, the estimated right ventricular systolic pressure is 31.5 mmHg. Left Atrium: Left atrial size was severely dilated. Right Atrium: Right atrial size was normal in size. Pericardium: There is no evidence of pericardial effusion. Mitral Valve: The mitral valve is degenerative in appearance. Trivial mitral valve regurgitation. No evidence of mitral valve stenosis. MV peak gradient, 6.7 mmHg. The mean mitral valve gradient is 4.0 mmHg. Tricuspid Valve: The tricuspid valve is normal in structure. Tricuspid valve regurgitation is trivial. No evidence of tricuspid stenosis. Aortic Valve:  The aortic valve is abnormal. There is mild calcification of the aortic valve. There is mild thickening of the aortic valve. Aortic valve regurgitation is not visualized. Aortic valve sclerosis/calcification is present, without any evidence  of aortic stenosis. Aortic valve mean gradient measures 8.0 mmHg. Aortic valve peak gradient measures 14.9 mmHg. Aortic valve area, by VTI measures 1.56 cm. Pulmonic Valve: The pulmonic valve was normal in structure. Pulmonic valve regurgitation is not visualized. No evidence of pulmonic stenosis. Aorta: The aortic root is normal in size and structure. Ascending aorta measurements are within normal limits for age when indexed to body surface area. Venous: The inferior vena cava is normal in size with greater than 50% respiratory variability, suggesting right atrial pressure of 3 mmHg. IAS/Shunts: No atrial level shunt detected by color flow Doppler.  LEFT VENTRICLE PLAX 2D LVIDd:         2.90 cm   Diastology LVIDs:         1.70 cm   LV e' medial:    7.72 cm/s LV PW:         1.10 cm   LV E/e' medial:  12.5 LV IVS:        1.20 cm   LV e' lateral:   3.81 cm/s LVOT diam:     2.20 cm   LV E/e' lateral: 25.4 LV SV:         54 LV SV Index:   39 LVOT Area:     3.80 cm  RIGHT VENTRICLE             IVC RV S prime:     11.90 cm/s  IVC diam: 0.70 cm TAPSE (M-mode): 2.0 cm LEFT ATRIUM             Index  RIGHT ATRIUM           Index LA diam:        3.30 cm 2.39 cm/m   RA Area:     12.40 cm LA Vol (A2C):   30.6 ml 22.13 ml/m  RA Volume:   33.40 ml  24.15 ml/m LA Vol (A4C):   44.7 ml 32.33 ml/m LA Biplane Vol: 38.0 ml 27.48 ml/m  AORTIC VALVE AV Area (Vmax):    1.72 cm AV Area (Vmean):   1.59 cm AV Area (VTI):     1.56 cm AV Vmax:           193.00 cm/s AV Vmean:          133.000 cm/s AV VTI:            0.345 m AV Peak Grad:      14.9 mmHg AV Mean Grad:      8.0 mmHg LVOT Vmax:         87.50 cm/s LVOT Vmean:        55.700 cm/s LVOT VTI:          0.142 m LVOT/AV VTI ratio: 0.41   AORTA Ao Root diam: 3.70 cm Ao Asc diam:  3.60 cm MITRAL VALVE                TRICUSPID VALVE MV Area (PHT): 4.18 cm     TR Peak grad:   28.5 mmHg MV Area VTI:   1.73 cm     TR Vmax:        267.00 cm/s MV Peak grad:  6.7 mmHg MV Mean grad:  4.0 mmHg     SHUNTS MV Vmax:       1.29 m/s     Systemic VTI:  0.14 m MV Vmean:      93.6 cm/s    Systemic Diam: 2.20 cm MV Decel Time: 182 msec MV E velocity: 96.80 cm/s MV A velocity: 127.00 cm/s MV E/A ratio:  0.76 Weston Brass MD Electronically signed by Weston Brass MD Signature Date/Time: 04/24/2022/2:41:16 PM    Final    CT Head Wo Contrast  Result Date: 04/23/2022 CLINICAL DATA:  Headache. EXAM: CT HEAD WITHOUT CONTRAST TECHNIQUE: Contiguous axial images were obtained from the base of the skull through the vertex without intravenous contrast. RADIATION DOSE REDUCTION: This exam was performed according to the departmental dose-optimization program which includes automated exposure control, adjustment of the mA and/or kV according to patient size and/or use of iterative reconstruction technique. COMPARISON:  Head CT 09/24/2021 FINDINGS: Brain: There is no evidence of an acute infarct, intracranial hemorrhage, mass, midline shift, or extra-axial fluid collection. Patchy to confluent hypodensities in the cerebral white matter bilaterally are similar to the prior study and are nonspecific but compatible with extensive chronic small vessel ischemic disease. There is mild cerebral atrophy. Vascular: Calcified atherosclerosis at the skull base. No hyperdense vessel. Skull: No fracture or suspicious osseous lesion. Advanced right TMJ arthropathy. Resorption or postsurgical deficiency of the left mandibular condyle. Sinuses/Orbits: Minimal mucosal thickening in the paranasal sinuses. Increased right mastoid effusion with surrounding sclerosis. Increased right middle ear opacification. Unremarkable orbits. Other: Partially visualized complex superficial structure  containing gas in the posterolateral left upper neck, also present previously. IMPRESSION: 1. No evidence of acute intracranial abnormality. 2. Extensive chronic small vessel ischemic disease. 3. Chronic right mastoid and middle ear effusions. Electronically Signed   By: Sebastian Ache M.D.   On: 04/23/2022 14:28   CT Angio  Chest PE W/Cm &/Or Wo Cm  Result Date: 04/23/2022 CLINICAL DATA:  Pulmonary embolism (PE) suspected, high prob EXAM: CT ANGIOGRAPHY CHEST WITH CONTRAST TECHNIQUE: Multidetector CT imaging of the chest was performed using the standard protocol during bolus administration of intravenous contrast. Multiplanar CT image reconstructions and MIPs were obtained to evaluate the vascular anatomy. RADIATION DOSE REDUCTION: This exam was performed according to the departmental dose-optimization program which includes automated exposure control, adjustment of the mA and/or kV according to patient size and/or use of iterative reconstruction technique. CONTRAST:  75mL OMNIPAQUE IOHEXOL 350 MG/ML SOLN COMPARISON:  04/19/2022 FINDINGS: Cardiovascular: Satisfactory opacification of the pulmonary arteries to the segmental level. No evidence of pulmonary embolism. Tortuosity of the thoracic aorta without aneurysmal dilation. Atherosclerotic calcifications of the aorta and coronary arteries. Normal heart size. Small pericardial effusion. Mediastinum/Nodes: Large hiatal hernia containing the majority of the stomach within the chest. Prominent mediastinal lymph nodes including 1.4 cm AP window node (series 5, image 110). No axillary or hilar lymphadenopathy. Trachea within normal limits. Lungs/Pleura: Findings of basilar predominant pulmonary fibrosis. Trace left pleural effusion. Previously described irregular nodule in the left lower lobe appears less solid on the current study. No pneumothorax. Upper Abdomen: Left-sided nephrolithiasis with partially imaged bilateral hydronephrosis. Musculoskeletal: Severe  thoracic kyphosis with numerous thoracic vertebral body compression fractures. No new or progressive fracture is seen compared to the recent prior CT. Review of the MIP images confirms the above findings. IMPRESSION: 1. No evidence of pulmonary embolism. 2. Chronic findings of pulmonary fibrosis. 3. Previously described irregular nodule in the left lower lobe appears less solid on the current study. 4. Trace left pleural effusion. 5. Large hiatal hernia containing the majority of the stomach within the chest. 6. Left-sided nephrolithiasis with partially imaged bilateral hydronephrosis. 7. Severe thoracic kyphosis with numerous thoracic vertebral body compression fractures. No new or progressive fracture is seen compared to the recent prior CT. 8. Aortic and coronary artery atherosclerosis (ICD10-I70.0). Electronically Signed   By: Duanne Guess D.O.   On: 04/23/2022 14:21        Scheduled Meds:  ALPRAZolam  0.5 mg Oral BID   amoxicillin-clavulanate  1 tablet Oral BID   diclofenac Sodium  4 g Topical QID   donepezil  10 mg Oral q morning   enoxaparin (LOVENOX) injection  30 mg Subcutaneous Q24H   fluticasone furoate-vilanterol  1 puff Inhalation Daily   ipratropium-albuterol  3 mL Nebulization Q6H   loratadine  10 mg Oral q morning   memantine  10 mg Oral BID   oseltamivir  30 mg Oral Daily   pantoprazole  40 mg Oral BID   potassium chloride  10 mEq Oral Daily   [START ON 04/26/2022] predniSONE  40 mg Oral Q breakfast   simvastatin  5 mg Oral Daily   zolpidem  5 mg Oral QHS   Continuous Infusions:  sodium chloride       LOS: 1 day    Time spent: 45 minutes spent on chart review, discussion with nursing staff, consultants, updating family and interview/physical exam; more than 50% of that time was spent in counseling and/or coordination of care.    Joseph Art, DO [facility] Available via Epic secure chat 7am-7pm After these hours, please refer to coverage provider  listed on [email or web address] 04/25/2022, 10:51 AM

## 2022-04-25 NOTE — Progress Notes (Signed)
   04/25/22 0705  Vitals  BP 90/74  MAP (mmHg) 79  BP Location Left Arm  BP Method Automatic  Patient Position (if appropriate) Lying  Pulse Rate (!) 136  Pulse Rate Source Dinamap  Resp (!) 24  Level of Consciousness  Level of Consciousness Alert  MEWS COLOR  MEWS Score Color Red  Oxygen Therapy  SpO2 (!) 88 %  O2 Device Nasal Cannula  O2 Flow Rate (L/min) 2 L/min  Pain Assessment  Pain Scale 0-10  Pain Score 0  MEWS Score  MEWS Temp 0  MEWS Systolic 1  MEWS Pulse 3  MEWS RR 1  MEWS LOC 0  MEWS Score 5   Received call from [facility] stating pt's heart rate sustaining in ST 130's. Arrived to room to find pt lying quietly in bed, denies c/o. Skin warm and dry, color appropriate, cap refill < 3 sec. Apical HR 134/min and regular. Pt's SaO2 on 2 lpm [place] at 88%, pt denies any increased SOB. O2 increased to 3 lpm [place]. Other vitals as above. Pt straightened up in bed, encouraged to take deep breaths and perform pursed lipped breathing. Pt's rhythm converted to SR 80's. Repeat b/p 107/74 once heart rate slowed. SaO2  91% on 3lpm , RR 22/min. Again, pt states that she has no complaints. States she is hungry and is ready for breakfast.

## 2022-04-25 NOTE — Progress Notes (Signed)
Tele called regarding pt sustained heart rate in 130's-140's. Pt resting in bed, no s/s noted or reported. Vitals taken and heart rate back in 80's. Charge nurse and MD made aware. No new orders at this time.

## 2022-04-26 ENCOUNTER — Ambulatory Visit: Payer: Medicare Other | Admitting: Physician Assistant

## 2022-04-26 DIAGNOSIS — R0902 Hypoxemia: Secondary | ICD-10-CM | POA: Diagnosis not present

## 2022-04-26 LAB — BASIC METABOLIC PANEL
Anion gap: 9 (ref 5–15)
BUN: 25 mg/dL — ABNORMAL HIGH (ref 8–23)
CO2: 26 mmol/L (ref 22–32)
Calcium: 8.7 mg/dL — ABNORMAL LOW (ref 8.9–10.3)
Chloride: 100 mmol/L (ref 98–111)
Creatinine, Ser: 0.99 mg/dL (ref 0.44–1.00)
GFR, Estimated: 56 mL/min — ABNORMAL LOW (ref >60)
Glucose, Bld: 97 mg/dL (ref 70–99)
Potassium: 4.9 mmol/L (ref 3.5–5.1)
Sodium: 135 mmol/L (ref 135–145)

## 2022-04-26 LAB — CBC
HCT: 35.8 % — ABNORMAL LOW (ref 36.0–46.0)
Hemoglobin: 11.2 g/dL — ABNORMAL LOW (ref 12.0–15.0)
MCH: 28.4 pg (ref 26.0–34.0)
MCHC: 31.3 g/dL (ref 30.0–36.0)
MCV: 90.9 fL (ref 80.0–100.0)
Platelets: 222 10*3/uL (ref 150–400)
RBC: 3.94 MIL/uL (ref 3.87–5.11)
RDW: 14 % (ref 11.5–15.5)
WBC: 11.5 10*3/uL — ABNORMAL HIGH (ref 4.0–10.5)
nRBC: 0 % (ref 0.0–0.2)

## 2022-04-26 NOTE — Progress Notes (Signed)
Pt pleasant and cooperative this shift with family at bedside. Pt hard of hearing but able to verbalize her needs. OOB to chair earlier today with 1 assist. Pt remains stable on 2L oxygen. Plan of care ongoing.

## 2022-04-26 NOTE — TOC Progression Note (Signed)
Transition of Care [facility]) - Progression Note    Patient Details  Name: [name] MRN: [id number] Date of Birth: 1934-10-16  [facility] of [facility]) CM/SW Contact  Leitha Bleak, RN Phone Number: 04/26/2022, 3:01 PM  Clinical Narrative: Left Daughter a message  to assess patient to get her baseline. Plan is for her to return home with family.    Barriers to Discharge: Continued Medical Work up  Expected Discharge Plan and Services    Social Determinants of Health (SDOH) Interventions SDOH Screenings   Food Insecurity: No Food Insecurity (04/23/2022)  Housing: Low Risk  (04/23/2022)  Transportation Needs: No Transportation Needs (04/23/2022)  Utilities: Not At Risk (04/23/2022)  Tobacco Use: High Risk (04/23/2022)    Readmission Risk Interventions    09/30/2021    9:22 AM 06/12/2021   12:11 PM  Readmission Risk Prevention Plan  Transportation Screening Complete Complete  PCP or Specialist Appt within 3-5 Days  Complete  HRI or Home Care Consult  Complete  Social Work Consult for Recovery Care Planning/Counseling  Complete  Palliative Care Screening  Not Applicable  Medication Review Ocean[facility]) Complete Complete  HRI or Home Care Consult Complete   SW Recovery Care/Counseling Consult Complete   Palliative Care Screening Not Applicable   Skilled Nursing Facility Complete

## 2022-04-26 NOTE — Progress Notes (Signed)
PROGRESS NOTE    [name]  VHQ:[id number] DOB: 12/25/1934 DOA: 04/23/2022 PCP: Marylynn Pearson, FNP    Brief Narrative:  [name] is a 88 y.o. female with medical history significant of heart failure with preserved EF, COPD, IBS, pulmonary fibrosis, hypertension, dementia.  Patient has a caregiver 8 hours a day.  Her daughter is her guardian due to her dementia.  Her daughter provides much of the history.  The patient has had decreased in function from baseline over the past 3 days.  She has been sleeping a lot and very unsteady on her feet.  Normally she is able to walk with a walker with minimal assistance and aid in getting to the bathroom and changing her depends.  Over the past 3 days, she is needed a lot of assistance when using her walker.  She is almost fell a few times using her walker due to imbalance.  She has been more short of breath and coughing with sputum production.  Her daughter called her pulmonologist, who prescribed her Augmentin and prednisone.  She took her first dose this morning.  Denies fevers, chills, chest pain.  Her appetite has been greatly diminished.  Slowly improving.   Assessment and Plan:  Influenza A -tamiflu started 4/21 with improvement in appetite  -supportive care  Acute respiratory failure from flu plus/minus COPD exacerbation -says she does not wear O2 at home -wean as able -Will continue with prednisone 40 mg and Augmentin. -Continue DuoNeb 4 times daily -Continue Breo inhaler -Strict I/O and Daily Weights -hold on diuresis as BP trending down and developing increased HR -x rays shows chronic fibrotic process  Dementia -continue home meds  AKI -resolved after IV lasix stopped   Overall poor prognosis-- currently DNR-- may need to involve palliative care   DVT prophylaxis: enoxaparin (LOVENOX) injection 30 mg Start: 04/23/22 2000    Code Status: DNR  LM for daughter  Disposition Plan:  Level of care: Med-Surg Status  is: inpt    Consultants:  none   Subjective: Asking when she can go home  Objective: Vitals:   04/26/22 0346 04/26/22 0400 04/26/22 0809 04/26/22 0812  BP:      Pulse:      Resp:  16    Temp:      TempSrc:      SpO2:   97% 100%  Weight: 48 kg     Height:        Intake/Output Summary (Last 24 hours) at 04/26/2022 1108 Last data filed at 04/25/2022 2000 Gross per 24 hour  Intake 240 ml  Output 600 ml  Net -360 ml   Filed Weights   04/24/22 0526 04/25/22 0459 04/26/22 0346  Weight: 50.4 kg 47.6 kg 48 kg    Examination:    General: Appearance:    elderly female in no acute distress   Large nodule on left posterior neck  Lungs:     respirations unlabored  Heart:    Normal heart rate.   MS:   All extremities are intact.   Neurologic:   Awake, alert- confused to situation-- VERY hard of hearing       Data Reviewed: I have personally reviewed following labs and imaging studies  CBC: Recent Labs  Lab 04/23/22 1020 04/26/22 0457  WBC 5.7 11.5*  NEUTROABS 4.9  --   HGB 10.8* 11.2*  HCT 34.5* 35.8*  MCV 91.3 90.9  PLT 209 222   Basic Metabolic Panel: Recent Labs  Lab 04/23/22  1020 04/24/22 0338 04/25/22 0523 04/26/22 0457  NA 132* 134* 135 135  K 4.6 4.3 4.1 4.9  CL 96* 97* 98 100  CO2 23 26 27 26   GLUCOSE 111* 93 94 97  BUN 22 24* 31* 25*  CREATININE 1.12* 1.10* 1.36* 0.99  CALCIUM 8.8* 8.6* 8.6* 8.7*   GFR: Estimated Creatinine Clearance: 26.9 mL/min (by C-G formula based on SCr of 0.99 mg/dL). Liver Function Tests: Recent Labs  Lab 04/23/22 1020  AST 31  ALT 18  ALKPHOS 92  BILITOT 0.4  PROT 6.9  ALBUMIN 3.4*   Recent Labs  Lab 04/23/22 1020  LIPASE 37   No results for input(s): "AMMONIA" in the last 168 hours. Coagulation Profile: No results for input(s): "INR", "PROTIME" in the last 168 hours. Cardiac Enzymes: No results for input(s): "CKTOTAL", "CKMB", "CKMBINDEX", "TROPONINI" in the last 168 hours. BNP (last 3 results) No  results for input(s): "PROBNP" in the last 8760 hours. HbA1C: No results for input(s): "HGBA1C" in the last 72 hours. CBG: No results for input(s): "GLUCAP" in the last 168 hours. Lipid Profile: No results for input(s): "CHOL", "HDL", "LDLCALC", "TRIG", "CHOLHDL", "LDLDIRECT" in the last 72 hours. Thyroid Function Tests: No results for input(s): "TSH", "T4TOTAL", "FREET4", "T3FREE", "THYROIDAB" in the last 72 hours. Anemia Panel: No results for input(s): "VITAMINB12", "FOLATE", "FERRITIN", "TIBC", "IRON", "RETICCTPCT" in the last 72 hours. Sepsis Labs: No results for input(s): "PROCALCITON", "LATICACIDVEN" in the last 168 hours.  Recent Results (from the past 240 hour(s))  SARS Coronavirus 2 by RT PCR (hospital order, performed in [facility] hospital lab) *cepheid single result test* Anterior Nasal Swab     Status: None   Collection Time: 04/23/22 10:31 AM   Specimen: Anterior Nasal Swab  Result Value Ref Range Status   SARS Coronavirus 2 by RT PCR NEGATIVE NEGATIVE Final    Comment: (NOTE) SARS-CoV-2 target nucleic acids are NOT DETECTED.  The SARS-CoV-2 RNA is generally detectable in upper and lower respiratory specimens during the acute phase of infection. The lowest concentration of SARS-CoV-2 viral copies this assay can detect is 250 copies / mL. A negative result does not preclude SARS-CoV-2 infection and should not be used as the sole basis for treatment or other patient management decisions.  A negative result may occur with improper specimen collection / handling, submission of specimen other than nasopharyngeal swab, presence of viral mutation(s) within the areas targeted by this assay, and inadequate number of viral copies (<250 copies / mL). A negative result must be combined with clinical observations, patient history, and epidemiological information.  Fact Sheet for Patients:   RoadLapTop.co.za  Fact Sheet for Healthcare  Providers: http://kim-miller.com/  This test is not yet approved or  cleared by the [place] FDA and has been authorized for detection and/or diagnosis of SARS-CoV-2 by FDA under an Emergency Use Authorization (EUA).  This EUA will remain in effect (meaning this test can be used) for the duration of the COVID-19 declaration under Section 564(b)(1) of the Act, 21 U.S.C. section 360bbb-3(b)(1), unless the authorization is terminated or revoked sooner.  Performed at [facility], [address] [place], [place] [address]   Respiratory (~20 pathogens) panel by PCR     Status: Abnormal   Collection Time: 04/24/22 11:20 AM   Specimen: Nasopharyngeal Swab; Respiratory  Result Value Ref Range Status   Adenovirus NOT DETECTED NOT DETECTED Final   Coronavirus 229E NOT DETECTED NOT DETECTED Final    Comment: (NOTE) The Coronavirus on the  Respiratory Panel, DOES NOT test for the novel  Coronavirus (2019 nCoV)    Coronavirus HKU1 NOT DETECTED NOT DETECTED Final   Coronavirus NL63 NOT DETECTED NOT DETECTED Final   Coronavirus OC43 NOT DETECTED NOT DETECTED Final   Metapneumovirus NOT DETECTED NOT DETECTED Final   Rhinovirus / Enterovirus NOT DETECTED NOT DETECTED Final   Influenza A H3 DETECTED (A) NOT DETECTED Final   Influenza B NOT DETECTED NOT DETECTED Final   Parainfluenza Virus 1 NOT DETECTED NOT DETECTED Final   Parainfluenza Virus 2 NOT DETECTED NOT DETECTED Final   Parainfluenza Virus 3 NOT DETECTED NOT DETECTED Final   Parainfluenza Virus 4 NOT DETECTED NOT DETECTED Final   Respiratory Syncytial Virus NOT DETECTED NOT DETECTED Final   Bordetella pertussis NOT DETECTED NOT DETECTED Final   Bordetella Parapertussis NOT DETECTED NOT DETECTED Final   Chlamydophila pneumoniae NOT DETECTED NOT DETECTED Final   Mycoplasma pneumoniae NOT DETECTED NOT DETECTED Final    Comment: Performed at [facility], [address], [place], [place] [address]          Radiology Studies: DG CHEST PORT 1 VIEW  Result Date: 04/25/2022 CLINICAL DATA:  Hypoxia EXAM: PORTABLE CHEST 1 VIEW COMPARISON:  04/23/2022 FINDINGS: Patient is rotated. Stable heart size. Large hiatal hernia. Aortic atherosclerosis. Chronic fibrotic lung changes bilaterally. Slightly increased streaky right basilar opacity favors atelectasis. No large pleural fluid collection. No pneumothorax. Numerous chronic bilateral rib deformities. IMPRESSION: 1. Slightly increased streaky right basilar opacity favors atelectasis. 2. Large hiatal hernia. Electronically Signed   By: Duanne Guess D.O.   On: 04/25/2022 09:42   ECHOCARDIOGRAM COMPLETE  Result Date: 04/24/2022    ECHOCARDIOGRAM REPORT   Patient Name:   [name] Date of Exam: 04/24/2022 Medical Rec #:  [id number]     Height:       56.0 in Accession #:    [id number]    Weight:       111.1 lb Date of Birth:  1934-01-09     BSA:          1.383 m Patient Age:    87 years      BP:           138/83 mmHg Patient Gender: F             HR:           90 bpm. [facility] Procedure: 2D Echo, Cardiac Doppler and Color Doppler Indications:    I50.40* Unspecified combined systolic (congestive) and diastolic                 (congestive) heart failure  History:        Patient has prior history of Echocardiogram examinations, most                 recent 09/30/2018. COPD, Signs/Symptoms:Shortness of Breath,                 Dyspnea, Chest Pain, Alzheimer's and Altered Mental Status; Risk                 Factors:Hypertension.  Sonographer:    Sheralyn Boatman RDCS Referring Phys: [id number] JACOB J STINSON IMPRESSIONS  1. Left ventricular ejection fraction, by estimation, is 55 to 60%. The left ventricle has normal function. The left ventricle has no regional wall motion abnormalities. There is moderate asymmetric left ventricular hypertrophy of the basal-septal segment. Left ventricular diastolic parameters are consistent with Grade I diastolic  dysfunction  (impaired relaxation).  2. Right ventricular systolic function is normal. The right ventricular size is normal. There is normal pulmonary artery systolic pressure. The estimated right ventricular systolic pressure is 31.5 mmHg.  3. Left atrial size was severely dilated.  4. The mitral valve is degenerative. Trivial mitral valve regurgitation. No evidence of mitral stenosis.  5. The aortic valve is abnormal. There is mild calcification of the aortic valve. There is mild thickening of the aortic valve. Aortic valve regurgitation is not visualized. Aortic valve sclerosis/calcification is present, without any evidence of aortic  stenosis.  6. The inferior vena cava is normal in size with greater than 50% respiratory variability, suggesting right atrial pressure of 3 mmHg. FINDINGS  Left Ventricle: Left ventricular ejection fraction, by estimation, is 55 to 60%. The left ventricle has normal function. The left ventricle has no regional wall motion abnormalities. The left ventricular internal cavity size was normal in size. There is  moderate asymmetric left ventricular hypertrophy of the basal-septal segment. Left ventricular diastolic parameters are consistent with Grade I diastolic dysfunction (impaired relaxation). Right Ventricle: The right ventricular size is normal. No increase in right ventricular wall thickness. Right ventricular systolic function is normal. There is normal pulmonary artery systolic pressure. The tricuspid regurgitant velocity is 2.67 m/s, and  with an assumed right atrial pressure of 3 mmHg, the estimated right ventricular systolic pressure is 31.5 mmHg. Left Atrium: Left atrial size was severely dilated. Right Atrium: Right atrial size was normal in size. Pericardium: There is no evidence of pericardial effusion. Mitral Valve: The mitral valve is degenerative in appearance. Trivial mitral valve regurgitation. No evidence of mitral valve stenosis. MV peak gradient, 6.7 mmHg. The mean mitral  valve gradient is 4.0 mmHg. Tricuspid Valve: The tricuspid valve is normal in structure. Tricuspid valve regurgitation is trivial. No evidence of tricuspid stenosis. Aortic Valve: The aortic valve is abnormal. There is mild calcification of the aortic valve. There is mild thickening of the aortic valve. Aortic valve regurgitation is not visualized. Aortic valve sclerosis/calcification is present, without any evidence  of aortic stenosis. Aortic valve mean gradient measures 8.0 mmHg. Aortic valve peak gradient measures 14.9 mmHg. Aortic valve area, by VTI measures 1.56 cm. Pulmonic Valve: The pulmonic valve was normal in structure. Pulmonic valve regurgitation is not visualized. No evidence of pulmonic stenosis. Aorta: The aortic root is normal in size and structure. Ascending aorta measurements are within normal limits for age when indexed to body surface area. Venous: The inferior vena cava is normal in size with greater than 50% respiratory variability, suggesting right atrial pressure of 3 mmHg. IAS/Shunts: No atrial level shunt detected by color flow Doppler.  LEFT VENTRICLE PLAX 2D LVIDd:         2.90 cm   Diastology LVIDs:         1.70 cm   LV e' medial:    7.72 cm/s LV PW:         1.10 cm   LV E/e' medial:  12.5 LV IVS:        1.20 cm   LV e' lateral:   3.81 cm/s LVOT diam:     2.20 cm   LV E/e' lateral: 25.4 LV SV:         54 LV SV Index:   39 LVOT Area:     3.80 cm  RIGHT VENTRICLE             IVC RV S prime:     11.90  cm/s  IVC diam: 0.70 cm TAPSE (M-mode): 2.0 cm LEFT ATRIUM             Index        RIGHT ATRIUM           Index LA diam:        3.30 cm 2.39 cm/m   RA Area:     12.40 cm LA Vol (A2C):   30.6 ml 22.13 ml/m  RA Volume:   33.40 ml  24.15 ml/m LA Vol (A4C):   44.7 ml 32.33 ml/m LA Biplane Vol: 38.0 ml 27.48 ml/m  AORTIC VALVE AV Area (Vmax):    1.72 cm AV Area (Vmean):   1.59 cm AV Area (VTI):     1.56 cm AV Vmax:           193.00 cm/s AV Vmean:          133.000 cm/s AV VTI:             0.345 m AV Peak Grad:      14.9 mmHg AV Mean Grad:      8.0 mmHg LVOT Vmax:         87.50 cm/s LVOT Vmean:        55.700 cm/s LVOT VTI:          0.142 m LVOT/AV VTI ratio: 0.41  AORTA Ao Root diam: 3.70 cm Ao Asc diam:  3.60 cm MITRAL VALVE                TRICUSPID VALVE MV Area (PHT): 4.18 cm     TR Peak grad:   28.5 mmHg MV Area VTI:   1.73 cm     TR Vmax:        267.00 cm/s MV Peak grad:  6.7 mmHg MV Mean grad:  4.0 mmHg     SHUNTS MV Vmax:       1.29 m/s     Systemic VTI:  0.14 m MV Vmean:      93.6 cm/s    Systemic Diam: 2.20 cm MV Decel Time: 182 msec MV E velocity: 96.80 cm/s MV A velocity: 127.00 cm/s MV E/A ratio:  0.76 Weston Brass MD Electronically signed by Weston Brass MD Signature Date/Time: 04/24/2022/2:41:16 PM    Final         Scheduled Meds:  ALPRAZolam  0.5 mg Oral BID   amoxicillin-clavulanate  1 tablet Oral BID   diclofenac Sodium  4 g Topical QID   donepezil  10 mg Oral q morning   enoxaparin (LOVENOX) injection  30 mg Subcutaneous Q24H   fluticasone furoate-vilanterol  1 puff Inhalation Daily   ipratropium-albuterol  3 mL Nebulization TID   loratadine  10 mg Oral q morning   memantine  10 mg Oral BID   oseltamivir  30 mg Oral Daily   pantoprazole  40 mg Oral BID   potassium chloride  10 mEq Oral Daily   predniSONE  40 mg Oral Q breakfast   simvastatin  5 mg Oral Daily   zolpidem  5 mg Oral QHS   Continuous Infusions:     LOS: 2 days    Time spent: 45 minutes spent on chart review, discussion with nursing staff, consultants, updating family and interview/physical exam; more than 50% of that time was spent in counseling and/or coordination of care.    Joseph Art, DO [facility] Available via Epic secure chat 7am-7pm After these hours, please refer to coverage provider listed on  [email or web address] 04/26/2022, 11:08 AM

## 2022-04-26 NOTE — TOC Initial Note (Signed)
Transition of Care [facility]) - Initial/Assessment Note    Patient Details  Name: [name] MRN: [id number] Date of Birth: May 09, 1934  Transition [facility]) CM/SW Contact:    Leitha Bleak, RN Phone Number: 04/26/2022, 4:14 PM  Clinical Narrative:    Spoke with Daughter, Patient lives with her and her son. She has a CAP aide 8 hours a day. At baseline she walks with a walker, able to go the the bathroom alone and change her own cloths. Aide assist with meals, patient walks to the kitchen to eat meals and sets up all day, with one nap after lunch and stayed up to 9PM. [place] does not think she is a back to baseline, or ready to discharge from the hospital. She is still on oxygen. [place] does wish for her to come home when medially stable and stronger. TOC following.               Barriers to Discharge: Continued Medical Work up   Patient Goals and CMS Choice Patient states their goals for this hospitalization and ongoing recovery are:: to go home if possible CMS Medicare.gov Compare Post Acute Care list provided to:: Patient Represenative (must comment) Choice offered to / list presented to : Adult Children      Expected Discharge Plan and Services       Living arrangements for the past 2 months: Single Family Home                                      Prior Living Arrangements/Services Living arrangements for the past 2 months: Single Family Home Lives with:: Adult Children              Current home services: DME    Activities of Daily Living Home Assistive Devices/Equipment: Wheelchair ADL Screening (condition at time of admission) Patient's cognitive ability adequate to safely complete daily activities?: Yes Is the patient deaf or have difficulty hearing?: Yes Does the patient have difficulty seeing, even when wearing glasses/contacts?: No Does the patient have difficulty concentrating, remembering, or making decisions?: No Patient able to express need for  assistance with ADLs?: Yes Does the patient have difficulty dressing or bathing?: Yes Independently performs ADLs?: Yes (appropriate for developmental age) Does the patient have difficulty walking or climbing stairs?: Yes Weakness of Legs: Both Weakness of Arms/Hands: Both  Permission Sought/Granted            Permission granted to share info w Relationship: Daughter     Emotional Assessment       Orientation: : Oriented to Self Alcohol / Substance Use: Not Applicable Psych Involvement: No (comment)  Admission diagnosis:  Hypoxia [R09.02] Patient Active Problem List   Diagnosis Date Noted   Hypoxia 04/23/2022   Anemia, unspecified 11/23/2021   Long term (current) use of inhaled steroids 11/23/2021   Unspecified dementia, unspecified severity, with mood disturbance 11/23/2021   Fracture of femoral neck, left, closed 09/24/2021   Acute respiratory failure with hypoxia 06/10/2021   Tobacco use disorder 06/10/2021   Generalized weakness 06/10/2021   Bilateral conjunctivitis 06/10/2021   Chronic diastolic CHF (congestive heart failure) 05/14/2021   Depression 05/14/2021   Hyperlipidemia    COPD exacerbation 05/13/2021   Long term (current) use of opiate analgesic 01/04/2021   Chronic pain 04/23/2019   General unsteadiness 04/23/2019   Polyarthropathy 04/23/2019   Pseudobulbar affect 04/23/2019   Tremor 04/23/2019  Chronic obstructive pulmonary disease, unspecified    Gastro-esophageal reflux disease without esophagitis    Hyperlipidemia, unspecified    Pulmonary fibrosis, unspecified    Acute on chronic diastolic (congestive) heart failure    Age-related osteoporosis without current pathological fracture    Anxiety disorder, unspecified    Chest pain, unspecified    Chronic diastolic (congestive) heart failure    Collapsed vertebra, not elsewhere classified, site unspecified, subsequent encounter for fracture with routine healing    Edema, unspecified    Lumbago  with sciatica    Overactive bladder    Pain in unspecified hand    Pressure ulcer of unspecified site, unspecified stage    Unspecified protein-calorie malnutrition    Acute diastolic heart failure 09/30/2018   Dyspnea 09/29/2018   Pneumonia 06/30/2018   Acute CHF (congestive heart failure) 06/29/2018   Vascular dementia without behavioral disturbance 06/26/2018   CAP (community acquired pneumonia) 06/23/2018   Bilateral pleural effusion 06/23/2018   Leukocytosis 06/20/2018   Chronic anemia 06/20/2018   Dementia 06/19/2018   Bilateral lower extremity edema 06/18/2018   Protein-calorie malnutrition, severe 06/18/2018   S/P internal fixation right hip fracture cannulated screw placement 04/06/18 04/27/2018   Urinary tract infection associated with catheterization of urinary tract 04/11/2018   Altered mental status 04/10/2018   Anxiety 04/10/2018   Hyponatremia 04/10/2018   UTI (urinary tract infection) due to urinary indwelling catheter 04/10/2018   Chronic constipation 04/09/2018   Urinary retention 04/09/2018   Closed right hip fracture 04/05/2018   GERD (gastroesophageal reflux disease) 04/05/2018   COPD with acute exacerbation 04/05/2018   Hiatal hernia    Dyspepsia 04/04/2014   Hematochezia 04/30/2011   RUQ pain 03/25/2011   Adenomatous polyps 03/25/2011   ABDOMINAL PAIN, LEFT LOWER QUADRANT 10/16/2009   HYPERLIPIDEMIA 10/09/2009   BRONCHITIS 10/09/2009   Osteoporosis 10/09/2009   Personal history of pneumonia (recurrent) 01/05/1999   Mixed hyperlipidemia 01/05/1999   PCP:  Marylynn Pearson, FNP Pharmacy:   [facility] - [place], [place] - [address] [place] [address] Phone: [phone] Fax: [phone]  [facility] - [address] [place], [place] - [address] [place] [address] Phone: [phone] Fax: [phone]  [id number] - [place], [place] - [address] AT [facility] [address] [place] [address] Phone: [phone] Fax: [phone]     Social Determinants of Health (SDOH) Social History: SDOH Screenings   Food Insecurity: No Food Insecurity (04/23/2022)  Housing: Low Risk  (04/23/2022)  Transportation Needs: No Transportation Needs (04/23/2022)  Utilities: Not At Risk (04/23/2022)  Tobacco Use: High Risk (04/23/2022)   SDOH Interventions:     Readmission Risk Interventions    09/30/2021    9:22 AM 06/12/2021   12:11 PM  Readmission Risk Prevention Plan  Transportation Screening Complete Complete  PCP or Specialist Appt within 3-5 Days  Complete  HRI or Home Care Consult  Complete  Social Work Consult for Recovery Care Planning/Counseling  Complete  Palliative Care Screening  Not Applicable  Medication Review Oceanographer) Complete Complete  HRI or Home Care Consult Complete   SW Recovery Care/Counseling Consult Complete   Palliative Care Screening Not Applicable   Skilled Nursing Facility Complete

## 2022-04-27 DIAGNOSIS — R0902 Hypoxemia: Secondary | ICD-10-CM | POA: Diagnosis not present

## 2022-04-27 MED ORDER — FUROSEMIDE 40 MG PO TABS
40.0000 mg | ORAL_TABLET | Freq: Every day | ORAL | Status: DC
  Administered 2022-04-27 – 2022-04-30 (×4): 40 mg via ORAL
  Filled 2022-04-27 (×4): qty 1

## 2022-04-27 NOTE — Progress Notes (Addendum)
PROGRESS NOTE    [name]  ZHY:[id number] DOB: 21-Dec-1934 DOA: 04/23/2022 PCP: Marylynn Pearson, FNP    Brief Narrative:  [name] is a 88 y.o. female with medical history significant of heart failure with preserved EF, COPD, IBS, pulmonary fibrosis, hypertension, dementia.  Patient has a caregiver 8 hours a day.  Her daughter is her guardian due to her dementia.  Her daughter provides much of the history.  The patient has had decreased in function from baseline over the past 3 days.  She has been sleeping a lot and very unsteady on her feet.  Normally she is able to walk with a walker with minimal assistance and aid in getting to the bathroom and changing her depends.  Over the past 3 days, she is needed a lot of assistance when using her walker.  She is almost fell a few times using her walker due to imbalance.  She has been more short of breath and coughing with sputum production.  Her daughter called her pulmonologist, who prescribed her Augmentin and prednisone.  She took her first dose this morning Slowly improving- appetite better, only needing O2 with exertion   Assessment and Plan:  Influenza A -tamiflu started 4/21 with improvement in appetite/breathing -supportive care  Acute respiratory failure from flu plus/minus COPD exacerbation -says she does not wear O2 at home -wean as able (today appears to only need O2 with exertion) -continue with prednisone 40 mg and Augmentin. -Continue DuoNeb 4 times daily -Continue Breo inhaler -Strict I/O and Daily Weights -hold on diuresis as BP trending down and developing increased HR -x rays shows chronic fibrotic process  Dementia -continue home meds -delirium precautions   AKI -resolved after IV lasix stopped -resume home lasix and monitor   Overall poor prognosis-- currently DNR-- may need to involve palliative care if not improved   DVT prophylaxis: enoxaparin (LOVENOX) injection 30 mg Start: 04/23/22 2000    Code  Status: DNR  Called daughter--no answer-- left message  Disposition Plan: home if able to come off O2 and ambulate similar to prior Level of care: Med-Surg Status is: inpt    Consultants:  none   Subjective: Up in chair-- asking for a depends so she doesn't have an accident  Objective: Vitals:   04/26/22 2047 04/26/22 2048 04/27/22 0441 04/27/22 0809  BP: (!) 110/94 113/85 133/75   Pulse: 76 73  75  Resp: Temp: 97.8 F (36.6 C) 97.8 F (36.6 C) (!) 97.4 F (36.3 C)   TempSrc: Oral Oral    SpO2: 94% 94% 99% 95%  Weight:   47.1 kg   Height:        Intake/Output Summary (Last 24 hours) at 04/27/2022 1210 Last data filed at 04/27/2022 0500 Gross per 24 hour  Intake 240 ml  Output 400 ml  Net -160 ml   Filed Weights   04/25/22 0459 04/26/22 0346 04/27/22 0441  Weight: 47.6 kg 48 kg 47.1 kg    Examination:    General: Appearance:    Elderly, confused female in no acute distress   Large nodule on left posterior neck  Lungs:     respirations unlabored  Heart:    Normal heart rate.   MS:   All extremities are intact. Min LE edema  Neurologic:   Awake, alert- confused to situation-- VERY hard of hearing       Data Reviewed: I have personally reviewed following labs and imaging studies  CBC:  Recent Labs  Lab 04/23/22 1020 04/26/22 0457  WBC 5.7 11.5*  NEUTROABS 4.9  --   HGB 10.8* 11.2*  HCT 34.5* 35.8*  MCV 91.3 90.9  PLT 209 222   Basic Metabolic Panel: Recent Labs  Lab 04/23/22 1020 04/24/22 0338 04/25/22 0523 04/26/22 0457  NA 132* 134* 135 135  K 4.6 4.3 4.1 4.9  CL 96* 97* 98 100  CO2 23 26 27 26   GLUCOSE 111* 93 94 97  BUN 22 24* 31* 25*  CREATININE 1.12* 1.10* 1.36* 0.99  CALCIUM 8.8* 8.6* 8.6* 8.7*   GFR: Estimated Creatinine Clearance: 26.6 mL/min (by C-G formula based on SCr of 0.99 mg/dL). Liver Function Tests: Recent Labs  Lab 04/23/22 1020  AST 31  ALT 18  ALKPHOS 92  BILITOT 0.4  PROT 6.9  ALBUMIN 3.4*    Recent Labs  Lab 04/23/22 1020  LIPASE 37   No results for input(s): "AMMONIA" in the last 168 hours. Coagulation Profile: No results for input(s): "INR", "PROTIME" in the last 168 hours. Cardiac Enzymes: No results for input(s): "CKTOTAL", "CKMB", "CKMBINDEX", "TROPONINI" in the last 168 hours. BNP (last 3 results) No results for input(s): "PROBNP" in the last 8760 hours. HbA1C: No results for input(s): "HGBA1C" in the last 72 hours. CBG: No results for input(s): "GLUCAP" in the last 168 hours. Lipid Profile: No results for input(s): "CHOL", "HDL", "LDLCALC", "TRIG", "CHOLHDL", "LDLDIRECT" in the last 72 hours. Thyroid Function Tests: No results for input(s): "TSH", "T4TOTAL", "FREET4", "T3FREE", "THYROIDAB" in the last 72 hours. Anemia Panel: No results for input(s): "VITAMINB12", "FOLATE", "FERRITIN", "TIBC", "IRON", "RETICCTPCT" in the last 72 hours. Sepsis Labs: No results for input(s): "PROCALCITON", "LATICACIDVEN" in the last 168 hours.  Recent Results (from the past 240 hour(s))  SARS Coronavirus 2 by RT PCR (hospital order, performed in [facility] hospital lab) *cepheid single result test* Anterior Nasal Swab     Status: None   Collection Time: 04/23/22 10:31 AM   Specimen: Anterior Nasal Swab  Result Value Ref Range Status   SARS Coronavirus 2 by RT PCR NEGATIVE NEGATIVE Final    Comment: (NOTE) SARS-CoV-2 target nucleic acids are NOT DETECTED.  The SARS-CoV-2 RNA is generally detectable in upper and lower respiratory specimens during the acute phase of infection. The lowest concentration of SARS-CoV-2 viral copies this assay can detect is 250 copies / mL. A negative result does not preclude SARS-CoV-2 infection and should not be used as the sole basis for treatment or other patient management decisions.  A negative result may occur with improper specimen collection / handling, submission of specimen other than nasopharyngeal swab, presence of viral mutation(s)  within the areas targeted by this assay, and inadequate number of viral copies (<250 copies / mL). A negative result must be combined with clinical observations, patient history, and epidemiological information.  Fact Sheet for Patients:   RoadLapTop.co.za  Fact Sheet for Healthcare Providers: http://kim-miller.com/  This test is not yet approved or  cleared by the [place] FDA and has been authorized for detection and/or diagnosis of SARS-CoV-2 by FDA under an Emergency Use Authorization (EUA).  This EUA will remain in effect (meaning this test can be used) for the duration of the COVID-19 declaration under Section 564(b)(1) of the Act, 21 U.S.C. section 360bbb-3(b)(1), unless the authorization is terminated or revoked sooner.  Performed at [facility], [address] [place], [place] [address]   Respiratory (~20 pathogens) panel by PCR     Status: Abnormal  Collection Time: 04/24/22 11:20 AM   Specimen: Nasopharyngeal Swab; Respiratory  Result Value Ref Range Status   Adenovirus NOT DETECTED NOT DETECTED Final   Coronavirus 229E NOT DETECTED NOT DETECTED Final    Comment: (NOTE) The Coronavirus on the Respiratory Panel, DOES NOT test for the novel  Coronavirus (2019 nCoV)    Coronavirus HKU1 NOT DETECTED NOT DETECTED Final   Coronavirus NL63 NOT DETECTED NOT DETECTED Final   Coronavirus OC43 NOT DETECTED NOT DETECTED Final   Metapneumovirus NOT DETECTED NOT DETECTED Final   Rhinovirus / Enterovirus NOT DETECTED NOT DETECTED Final   Influenza A H3 DETECTED (A) NOT DETECTED Final   Influenza B NOT DETECTED NOT DETECTED Final   Parainfluenza Virus 1 NOT DETECTED NOT DETECTED Final   Parainfluenza Virus 2 NOT DETECTED NOT DETECTED Final   Parainfluenza Virus 3 NOT DETECTED NOT DETECTED Final   Parainfluenza Virus 4 NOT DETECTED NOT DETECTED Final   Respiratory Syncytial Virus NOT DETECTED NOT DETECTED Final   Bordetella  pertussis NOT DETECTED NOT DETECTED Final   Bordetella Parapertussis NOT DETECTED NOT DETECTED Final   Chlamydophila pneumoniae NOT DETECTED NOT DETECTED Final   Mycoplasma pneumoniae NOT DETECTED NOT DETECTED Final    Comment: Performed at [facility], [address] [place], [place] [address]         Radiology Studies: No results found.      Scheduled Meds:  ALPRAZolam  0.5 mg Oral BID   amoxicillin-clavulanate  1 tablet Oral BID   diclofenac Sodium  4 g Topical QID   donepezil  10 mg Oral q morning   enoxaparin (LOVENOX) injection  30 mg Subcutaneous Q24H   fluticasone furoate-vilanterol  1 puff Inhalation Daily   ipratropium-albuterol  3 mL Nebulization TID   loratadine  10 mg Oral q morning   memantine  10 mg Oral BID   oseltamivir  30 mg Oral Daily   pantoprazole  40 mg Oral BID   potassium chloride  10 mEq Oral Daily   predniSONE  40 mg Oral Q breakfast   simvastatin  5 mg Oral Daily   zolpidem  5 mg Oral QHS   Continuous Infusions:     LOS: 3 days    Time spent: 45 minutes spent on chart review, discussion with nursing staff, consultants, updating family and interview/physical exam; more than 50% of that time was spent in counseling and/or coordination of care.    Joseph Art, DO [facility] Available via Epic secure chat 7am-7pm After these hours, please refer to coverage provider listed on [email or web address] 04/27/2022, 12:10 PM

## 2022-04-27 NOTE — Progress Notes (Signed)
Per order to wean pt to room air, o2 removed and tolerating well. Sats staying 90-93% on RA. Pt OOB to chair this shift transferred x1 assist. Pt currently back in bed. Call bell in reach, bed in low position. Plan of care ongoing.

## 2022-04-27 NOTE — Progress Notes (Signed)
Physical Therapy Treatment Patient Details Name: [name] MRN: [id number] DOB: [birth year] Today's Date: 04/27/2022   History of Present Illness [name] is a 88 y.o. female with medical history significant of heart failure with preserved EF, COPD, IBS, pulmonary fibrosis, hypertension, dementia.  Patient has a caregiver 8 hours a day.  Her daughter is her guardian due to her dementia.  Her daughter provides much of the history.  The patient has had decreased in function from baseline over the past 3 days.  She has been sleeping a lot and very unsteady on her feet.  Normally she is able to walk with a walker with minimal assistance and aid in getting to the bathroom and changing her depends.  Over the past 3 days, she is needed a lot of assistance when using her walker.  She is almost fell a few times using her walker due to imbalance.  She has been more short of breath and coughing with sputum production.  Her daughter called her pulmonologist, who prescribed her Augmentin and prednisone.  She took her first dose this morning.  Denies fevers, chills, chest pain.  Her appetite has been greatly diminished.    PT Comments    Patient agreeable for therapy and requires repeated verbal/tactile cueing following instructions mostly due to very HOH.  Patient demonstrates good return for sitting up at bedside, transferring to chair and able to ambulate outside of room with slow labored cadence without loss of balance.  Patient ambulated on room air with SpO2 a 91%, after sitting up in chair patient demonstrated labored breathing with SpO2 dropping to 88% and put on 2 LPM with SpO2 at 89-91% - nurse notified.  Patient's HR also increased to 133 BPM.  Patient tolerated sitting up in chair after therapy.  Patient will benefit from continued skilled physical therapy in hospital and recommended venue below to increase strength, balance, endurance for safe ADLs and gait.     Recommendations for follow up  therapy are one component of a multi-disciplinary discharge planning process, led by the attending physician.  Recommendations may be updated based on patient status, additional functional criteria and insurance authorization.  Follow Up Recommendations       Assistance Recommended at Discharge Set up Supervision/Assistance  Patient can return home with the following A little help with walking and/or transfers;A little help with bathing/dressing/bathroom;Help with stairs or ramp for entrance;Assistance with cooking/housework   Equipment Recommendations  None recommended by PT    Recommendations for Other Services       Precautions / Restrictions Precautions Precautions: Fall Restrictions Weight Bearing Restrictions: No     Mobility  Bed Mobility Overal bed mobility: Needs Assistance Bed Mobility: Supine to Sit     Supine to sit: Supervision     General bed mobility comments: slightly labored movement    Transfers Overall transfer level: Needs assistance Equipment used: Rolling walker (2 wheels) Transfers: Sit to/from Stand, Bed to chair/wheelchair/BSC Sit to Stand: Supervision, Min guard   Step pivot transfers: Supervision, Min guard       General transfer comment: slightly labored movement, repeated verbal cueing due to [facility]    Ambulation/Gait Ambulation/Gait assistance: Supervision, Min guard Gait Distance (Feet): 35 Feet Assistive device: Rolling walker (2 wheels) Gait Pattern/deviations: Decreased step length - right, Decreased step length - left, Decreased stride length Gait velocity: decreased     General Gait Details: slightly labored cadence without loss of balance, limited mostly due to fatigue, on room air with  SpO2 at 91%   Stairs             Wheelchair Mobility    Modified Rankin (Stroke Patients Only)       Balance Overall balance assessment: Needs assistance Sitting-balance support: Feet supported, No upper extremity  supported Sitting balance-Leahy Scale: Fair Sitting balance - Comments: fair/good seated at EOB   Standing balance support: During functional activity, Bilateral upper extremity supported Standing balance-Leahy Scale: Fair Standing balance comment: fair/good using RW                            Cognition Arousal/Alertness: Awake/alert Behavior During Therapy: WFL for tasks assessed/performed Overall Cognitive Status: Within Functional Limits for tasks assessed                                          Exercises      General Comments        Pertinent Vitals/Pain Pain Assessment Pain Assessment: No/denies pain    Home Living                          Prior Function            PT Goals (current goals can now be found in the care plan section) Acute Rehab PT Goals Patient Stated Goal: return home with family to assist PT Goal Formulation: With patient Time For Goal Achievement: 05/07/22 Potential to Achieve Goals: Good Progress towards PT goals: Progressing toward goals    Frequency    Min 3X/week      PT Plan Discharge plan needs to be updated    Co-evaluation              AM-PAC PT "6 Clicks" Mobility   Outcome Measure  Help needed turning from your back to your side while in a flat bed without using bedrails?: None Help needed moving from lying on your back to sitting on the side of a flat bed without using bedrails?: A Little Help needed moving to and from a bed to a chair (including a wheelchair)?: A Little Help needed standing up from a chair using your arms (e.g., wheelchair or bedside chair)?: A Little Help needed to walk in hospital room?: A Little Help needed climbing 3-5 steps with a railing? : A Little 6 Click Score: 19    End of Session   Activity Tolerance: Patient tolerated treatment well;Patient limited by fatigue Patient left: in chair;with bed alarm set Nurse Communication: Mobility status PT  Visit Diagnosis: Unsteadiness on feet (R26.81);Other abnormalities of gait and mobility (R26.89);Muscle weakness (generalized) (M62.81)     Time: 8119-1478 PT Time Calculation (min) (ACUTE ONLY): 35 min  Charges:  $Gait Training: 8-22 mins $Therapeutic Activity: 8-22 mins                     2:27 PM, 04/27/22 Ocie Bob, MPT Physical Therapist with [facility] 336 [phone] office [phone] mobile phone

## 2022-04-27 NOTE — Progress Notes (Signed)
Patient has been stable this shift. Patient has voiced concerns about her breathing, but has been stable on her 2 liters of oxygen.  Patient complained of neck pain earlier in shift. Patient medicated per MAR.

## 2022-04-28 DIAGNOSIS — I5033 Acute on chronic diastolic (congestive) heart failure: Secondary | ICD-10-CM | POA: Diagnosis not present

## 2022-04-28 DIAGNOSIS — J441 Chronic obstructive pulmonary disease with (acute) exacerbation: Secondary | ICD-10-CM

## 2022-04-28 DIAGNOSIS — R0902 Hypoxemia: Secondary | ICD-10-CM | POA: Diagnosis not present

## 2022-04-28 DIAGNOSIS — J101 Influenza due to other identified influenza virus with other respiratory manifestations: Secondary | ICD-10-CM

## 2022-04-28 LAB — CBC
HCT: 36.7 % (ref 36.0–46.0)
Hemoglobin: 11.3 g/dL — ABNORMAL LOW (ref 12.0–15.0)
MCH: 27.9 pg (ref 26.0–34.0)
MCHC: 30.8 g/dL (ref 30.0–36.0)
MCV: 90.6 fL (ref 80.0–100.0)
Platelets: 263 10*3/uL (ref 150–400)
RBC: 4.05 MIL/uL (ref 3.87–5.11)
RDW: 13.5 % (ref 11.5–15.5)
WBC: 7.2 10*3/uL (ref 4.0–10.5)
nRBC: 0 % (ref 0.0–0.2)

## 2022-04-28 LAB — BASIC METABOLIC PANEL
Anion gap: 8 (ref 5–15)
BUN: 29 mg/dL — ABNORMAL HIGH (ref 8–23)
CO2: 26 mmol/L (ref 22–32)
Calcium: 8.5 mg/dL — ABNORMAL LOW (ref 8.9–10.3)
Chloride: 100 mmol/L (ref 98–111)
Creatinine, Ser: 1.11 mg/dL — ABNORMAL HIGH (ref 0.44–1.00)
GFR, Estimated: 49 mL/min — ABNORMAL LOW (ref >60)
Glucose, Bld: 105 mg/dL — ABNORMAL HIGH (ref 70–99)
Potassium: 5 mmol/L (ref 3.5–5.1)
Sodium: 134 mmol/L — ABNORMAL LOW (ref 135–145)

## 2022-04-28 LAB — POTASSIUM: Potassium: 5.1 mmol/L (ref 3.5–5.1)

## 2022-04-28 NOTE — Progress Notes (Signed)
Physical Therapy Treatment Patient Details Name: [name] MRN: [id number] DOB: 03-12-1934 Today's Date: 04/28/2022   History of Present Illness [name] is a 88 y.o. female with medical history significant of heart failure with preserved EF, COPD, IBS, pulmonary fibrosis, hypertension, dementia.  Patient has a caregiver 8 hours a day.  Her daughter is her guardian due to her dementia.  Her daughter provides much of the history.  The patient has had decreased in function from baseline over the past 3 days.  She has been sleeping a lot and very unsteady on her feet.  Normally she is able to walk with a walker with minimal assistance and aid in getting to the bathroom and changing her depends.  Over the past 3 days, she is needed a lot of assistance when using her walker.  She is almost fell a few times using her walker due to imbalance.  She has been more short of breath and coughing with sputum production.  Her daughter called her pulmonologist, who prescribed her Augmentin and prednisone.  She took her first dose this morning.  Denies fevers, chills, chest pain.  Her appetite has been greatly diminished.    PT Comments    Pt requesting to get out of bed and get on a dry undergarment.  Pt able to get half sitting in bed and required max cues both verbally and manual to scoot to edge of bed.  Pt required min assist to stand initially then after CGA.  Pt unable to doff and don her undergarment requiring max assist from therapist.  Pt then returned to bed with min assist.  Max cues to attempt scooting up in bed and position self.  Can complete more tasks with increased time but requires encouragement to do so.      Recommendations for follow up therapy are one component of a multi-disciplinary discharge planning process, led by the attending physician.  Recommendations may be updated based on patient status, additional functional criteria and insurance authorization.     Assistance Recommended  at Discharge Set up Supervision/Assistance  Patient can return home with the following A little help with walking and/or transfers;A little help with bathing/dressing/bathroom;Help with stairs or ramp for entrance;Assistance with cooking/housework   Equipment Recommendations  None recommended by PT           Mobility  Bed Mobility Overal bed mobility: Needs Assistance Bed Mobility: Supine to Sit, Sit to Supine     Supine to sit: Min assist, Mod assist Sit to supine: Min assist, Mod assist   General bed mobility comments: requires max cues and encouragement to attempt mobiltiy on own    Transfers Overall transfer level: Needs assistance Equipment used: Rolling walker (2 wheels) Transfers: Sit to/from Stand, Bed to chair/wheelchair/BSC Sit to Stand: Min assist   Step pivot transfers: Min guard, Min assist       General transfer comment: Min A to boost from EOB. More min G assist for steps to chair with RW.    Ambulation/Gait Ambulation/Gait assistance: Min guard, Min assist Gait Distance (Feet): 10 Feet Assistive device: Rolling walker (2 wheels)     Gait velocity interpretation: <1.31 ft/sec, indicative of household ambulator          Balance  Cognition Arousal/Alertness: Awake/alert Behavior During Therapy: WFL for tasks assessed/performed Overall Cognitive Status: Within Functional Limits for tasks assessed                                            PT Goals (current goals can now be found in the care plan section) Progress towards PT goals: Progressing toward goals    Frequency    Min 3X/week      PT Plan Current plan remains appropriate       AM-PAC PT "6 Clicks" Mobility   Outcome Measure  Help needed turning from your back to your side while in a flat bed without using bedrails?: None Help needed moving from lying on your back to sitting on the side of a flat  bed without using bedrails?: A Little Help needed moving to and from a bed to a chair (including a wheelchair)?: A Little Help needed standing up from a chair using your arms (e.g., wheelchair or bedside chair)?: A Little Help needed to walk in hospital room?: A Little Help needed climbing 3-5 steps with a railing? : A Little 6 Click Score: 19    End of Session   Activity Tolerance: Patient tolerated treatment well;Patient limited by fatigue Patient left: with bed alarm set;in bed Nurse Communication: Mobility status PT Visit Diagnosis: Unsteadiness on feet (R26.81);Other abnormalities of gait and mobility (R26.89);Muscle weakness (generalized) (M62.81)     Time: 1630-1650 PT Time Calculation (min) (ACUTE ONLY): 20 min  Charges:  $Therapeutic Activity: 8-22 mins                     Lurena Nida, PTA/CLT [facility] Ph: [phone]    Lurena Nida 04/28/2022, 4:49 PM

## 2022-04-28 NOTE — Progress Notes (Signed)
PROGRESS NOTE    [name]  GNF:[id number] DOB: 1934-11-17 DOA: 04/23/2022 PCP: Marylynn Pearson, FNP   Brief Narrative: [name] is a 88 y.o. female with a history of chronic diastolic heart failure, COPD, IBS, pulmonary fibrosis, hypertension, dementia. Patient presented secondary to weakness and was found to have evidence of influenza A infection with associated COPD exacerbation, heart failure exacerbation and hypoxia. Patient managed with oseltamivir, Lasix and supplemental oxygen with improvement of symptoms. Hospitalization complicated by development of an AKI secondary to over-diuresis which has resolved with adjustment of Lasix. Continued attempts to wean oxygen to room air as able.   Assessment and Plan:  Influenza A infection Patient started on Tamiflu with plan for a 5 day course. Symptoms improved.  Acute respiratory failure with hypoxia Secondary to COPD exacerbation and influenza A infection. Patient weaning to room air as able. -Wean to room air -Ambulatory pulse ox prior to discharge  COPD exacerbation Precipitated by influenza infection. Associated hypoxia requiring oxygen supplementation. -Continue prednisone with taper -Continue Duoneb treatments -Continue Breo Ellipta -Continue Augmentin  AKI Secondary to IV lasix over-diuresis. Improved and stable.  High-normal potassium Secondary to supplementation. And recent AKI. -Discontinue potassium supplementation -Recheck potassium in AM  Acute on chronic diastolic heart failure Normal LVEF. Patient treated with Lasix IV on admission. Resolved.  Dementia Noted. -Continue Aricept and Xanax   DVT prophylaxis: Lovenox Code Status:   Code Status: DNR Family Communication: Daughter via telephone Disposition Plan: Discharge home with home health and possibly supplemental oxygen in 24 hours pending continued attempts to wean oxygen and stable potassium   Consultants:  None  Procedures:  4/20:  Transthoracic Echocardiogram  Antimicrobials: Oseltamivir    Subjective: Patient reports no chest pain or dyspnea.  Objective: BP (!) 147/89 (BP Location: Left Arm)   Pulse 68   Temp 97.8 F (36.6 C) (Oral)   Resp 18   Ht  (1.422 m)   Wt 51.1 kg   SpO2 95%   BMI 25.26 kg/m   Examination:  General exam: Appears calm and comfortable Respiratory system: Respiratory effort normal. Cardiovascular system: S1 & S2 heard, Normal rate with regular rhythm. Gastrointestinal system: Abdomen is nondistended, soft and nontender. Normal bowel sounds heard. Central nervous system: Alert and oriented. No focal neurological deficits. Musculoskeletal: No edema. No calf tenderness Psychiatry: Judgement and insight appear normal. Mood & affect appropriate.    Data Reviewed: I have personally reviewed following labs and imaging studies  CBC Lab Results  Component Value Date   WBC 7.2 04/28/2022   RBC 4.05 04/28/2022   HGB 11.3 (L) 04/28/2022   HCT 36.7 04/28/2022   MCV 90.6 04/28/2022   MCH 27.9 04/28/2022   PLT 263 04/28/2022   MCHC 30.8 04/28/2022   RDW 13.5 04/28/2022   LYMPHSABS 0.4 (L) 04/23/2022   MONOABS 0.4 04/23/2022   EOSABS 0.0 04/23/2022   BASOSABS 0.0 04/23/2022     Last metabolic panel Lab Results  Component Value Date   NA 134 (L) 04/28/2022   K 5.1 04/28/2022   CL 100 04/28/2022   CO2 26 04/28/2022   BUN 29 (H) 04/28/2022   CREATININE 1.11 (H) 04/28/2022   GLUCOSE 105 (H) 04/28/2022   GFRNONAA 49 (L) 04/28/2022   GFRAA >60 07/06/2019   CALCIUM 8.5 (L) 04/28/2022   PHOS 2.9 09/30/2021   PROT 6.9 04/23/2022   ALBUMIN 3.4 (L) 04/23/2022   BILITOT 0.4 04/23/2022   ALKPHOS 92 04/23/2022   AST 31 04/23/2022  ALT 18 04/23/2022   ANIONGAP 8 04/28/2022    GFR: Estimated Creatinine Clearance: 24.7 mL/min (A) (by C-G formula based on SCr of 1.11 mg/dL (H)).  Recent Results (from the past 240 hour(s))  SARS Coronavirus 2 by RT PCR (hospital order,  performed in [facility] hospital lab) *cepheid single result test* Anterior Nasal Swab     Status: None   Collection Time: 04/23/22 10:31 AM   Specimen: Anterior Nasal Swab  Result Value Ref Range Status   SARS Coronavirus 2 by RT PCR NEGATIVE NEGATIVE Final    Comment: (NOTE) SARS-CoV-2 target nucleic acids are NOT DETECTED.  The SARS-CoV-2 RNA is generally detectable in upper and lower respiratory specimens during the acute phase of infection. The lowest concentration of SARS-CoV-2 viral copies this assay can detect is 250 copies / mL. A negative result does not preclude SARS-CoV-2 infection and should not be used as the sole basis for treatment or other patient management decisions.  A negative result may occur with improper specimen collection / handling, submission of specimen other than nasopharyngeal swab, presence of viral mutation(s) within the areas targeted by this assay, and inadequate number of viral copies (<250 copies / mL). A negative result must be combined with clinical observations, patient history, and epidemiological information.  Fact Sheet for Patients:   RoadLapTop.co.za  Fact Sheet for Healthcare Providers: http://kim-miller.com/  This test is not yet approved or  cleared by the [place] FDA and has been authorized for detection and/or diagnosis of SARS-CoV-2 by FDA under an Emergency Use Authorization (EUA).  This EUA will remain in effect (meaning this test can be used) for the duration of the COVID-19 declaration under Section 564(b)(1) of the Act, 21 U.S.C. section 360bbb-3(b)(1), unless the authorization is terminated or revoked sooner.  Performed at [facility], [address], [place], [place] [address]   Respiratory (~20 pathogens) panel by PCR     Status: Abnormal   Collection Time: 04/24/22 11:20 AM   Specimen: Nasopharyngeal Swab; Respiratory  Result Value Ref Range Status   Adenovirus NOT  DETECTED NOT DETECTED Final   Coronavirus 229E NOT DETECTED NOT DETECTED Final    Comment: (NOTE) The Coronavirus on the Respiratory Panel, DOES NOT test for the novel  Coronavirus (2019 nCoV)    Coronavirus HKU1 NOT DETECTED NOT DETECTED Final   Coronavirus NL63 NOT DETECTED NOT DETECTED Final   Coronavirus OC43 NOT DETECTED NOT DETECTED Final   Metapneumovirus NOT DETECTED NOT DETECTED Final   Rhinovirus / Enterovirus NOT DETECTED NOT DETECTED Final   Influenza A H3 DETECTED (A) NOT DETECTED Final   Influenza B NOT DETECTED NOT DETECTED Final   Parainfluenza Virus 1 NOT DETECTED NOT DETECTED Final   Parainfluenza Virus 2 NOT DETECTED NOT DETECTED Final   Parainfluenza Virus 3 NOT DETECTED NOT DETECTED Final   Parainfluenza Virus 4 NOT DETECTED NOT DETECTED Final   Respiratory Syncytial Virus NOT DETECTED NOT DETECTED Final   Bordetella pertussis NOT DETECTED NOT DETECTED Final   Bordetella Parapertussis NOT DETECTED NOT DETECTED Final   Chlamydophila pneumoniae NOT DETECTED NOT DETECTED Final   Mycoplasma pneumoniae NOT DETECTED NOT DETECTED Final    Comment: Performed at [facility], [address], [place], [place] [address]      Radiology Studies: No results found.    LOS: 4 days    Jacquelin Hawking, MD [facility] 04/28/2022, 1:28 PM   If 7PM-7AM, please contact night-coverage [email or web address]

## 2022-04-28 NOTE — Plan of Care (Signed)
  Problem: Acute Rehab OT Goals (only OT should resolve) Goal: Pt. Will Perform Grooming Flowsheets (Taken 04/28/2022 1215) Pt Will Perform Grooming:  with supervision  standing Goal: Pt. Will Perform Lower Body Dressing Flowsheets (Taken 04/28/2022 1215) Pt Will Perform Lower Body Dressing:  with supervision  sitting/lateral leans Goal: Pt. Will Transfer To Toilet Flowsheets (Taken 04/28/2022 1215) Pt Will Transfer to Toilet:  with supervision  stand pivot transfer  ambulating Goal: Pt. Will Perform Toileting-Clothing Manipulation Flowsheets (Taken 04/28/2022 1215) Pt Will Perform Toileting - Clothing Manipulation and hygiene:  with modified independence  sitting/lateral leans Goal: Pt/Caregiver Will Perform Home Exercise Program Flowsheets (Taken 04/28/2022 1215) Pt/caregiver will Perform Home Exercise Program:  Increased strength  Both right and left upper extremity  Independently  Katricia Prehn OT, MOT

## 2022-04-28 NOTE — Progress Notes (Signed)
   04/28/22 0800  ReDS Vest / Clip  Station Marker A  Ruler Value 30  ReDS Value Range < 36  ReDS Actual Value 33

## 2022-04-28 NOTE — Progress Notes (Signed)
Mobility Specialist Progress Note:    04/28/22 1340  Mobility  Activity Ambulated independently in hallway;Transferred from chair to bed  Level of Assistance Standby assist, set-up cues, supervision of patient - no hands on  Assistive Device Front wheel walker  Distance Ambulated (ft) 34 ft  Activity Response Tolerated well  Mobility Referral Yes  $Mobility charge 1 Mobility   Pt agreeable to mobility session. Tolerated well, c/o back pain. Denies SOB, dizziness, or fatigue. Returned pt to bed after ambulating in hallway with RW. Left pt in bed, all needs met, respiratory staff in room.   Pre Mobility: SpO2 895 on 1L During Ambulation: SpO2 91% on 1L  Feliciana Rossetti Mobility Specialist Please contact via Special educational needs teacher or  Rehab office at [phone]

## 2022-04-28 NOTE — Evaluation (Signed)
Occupational Therapy Evaluation Patient Details Name: [name] MRN: [id number] DOB: 10/30/1934 Today's Date: 04/28/2022   History of Present Illness [name] is a 88 y.o. female with medical history significant of heart failure with preserved EF, COPD, IBS, pulmonary fibrosis, hypertension, dementia.  Patient has a caregiver 8 hours a day.  Her daughter is her guardian due to her dementia.  Her daughter provides much of the history.  The patient has had decreased in function from baseline over the past 3 days.  She has been sleeping a lot and very unsteady on her feet.  Normally she is able to walk with a walker with minimal assistance and aid in getting to the bathroom and changing her depends.  Over the past 3 days, she is needed a lot of assistance when using her walker.  She is almost fell a few times using her walker due to imbalance.  She has been more short of breath and coughing with sputum production.  Her daughter called her pulmonologist, who prescribed her Augmentin and prednisone.  She took her first dose this morning.  Denies fevers, chills, chest pain.  Her appetite has been greatly diminished.   Clinical Impression   Pt agreeable to OT evaluation. Pt reports having 24/7 assist at home with some assist for bathing and assist for IADL's. Pt required min A to boost from chair today and min G assist for ambulation forward and backward from chair ~5 feet each direction with RW. Pt noted to desaturate to 84% SpO2 without supplemental O2. Pt put back on 2 LPM and increased to ~92% SpO2. Per pt report she can have assist at home for deficit areas. Pt will benefit from continued OT in the hospital and recommended venue below to increase strength, balance, and endurance for safe ADL's.        Recommendations for follow up therapy are one component of a multi-disciplinary discharge planning process, led by the attending physician.  Recommendations may be updated based on patient status,  additional functional criteria and insurance authorization.   Assistance Recommended at Discharge Frequent or constant Supervision/Assistance  Patient can return home with the following A little help with walking and/or transfers;A lot of help with bathing/dressing/bathroom;Assistance with cooking/housework;Assist for transportation;Help with stairs or ramp for entrance    Functional Status Assessment  Patient has had a recent decline in their functional status and demonstrates the ability to make significant improvements in function in a reasonable and predictable amount of time.  Equipment Recommendations  None recommended by OT           Precautions / Restrictions Precautions Precautions: Fall Restrictions Weight Bearing Restrictions: No      Mobility Bed Mobility               General bed mobility comments: seated in chair at start of session.    Transfers Overall transfer level: Needs assistance Equipment used: Rolling walker (2 wheels) Transfers: Sit to/from Stand, Bed to chair/wheelchair/BSC Sit to Stand: Min assist     Step pivot transfers: Min guard, Min assist     General transfer comment: Min A to boost from chair. More min G assist for steps to chair with RW.      Balance Overall balance assessment: Needs assistance Sitting-balance support: Feet supported, Bilateral upper extremity supported Sitting balance-Leahy Scale: Fair Sitting balance - Comments: fair/good seated in recliner   Standing balance support: During functional activity, Bilateral upper extremity supported Standing balance-Leahy Scale: Fair Standing balance comment:  fair using RW                           ADL either performed or assessed with clinical judgement   ADL Overall ADL's : Needs assistance/impaired         Upper Body Bathing: Sitting;Set up   Lower Body Bathing: Maximal assistance;Moderate assistance;Sitting/lateral leans   Upper Body Dressing : Set  up;Sitting   Lower Body Dressing: Maximal assistance;Sitting/lateral leans   Toilet Transfer: Minimal assistance;Ambulation;Stand-pivot;Rolling walker (2 wheels) Toilet Transfer Details (indicate cue type and reason): Simulated via ambulation in room with RW. Toileting- Clothing Manipulation and Hygiene: Minimal assistance;Sitting/lateral lean       Functional mobility during ADLs: Minimal assistance;Rolling walker (2 wheels) General ADL Comments: Pt ambulated ~5 feet forward and backward from chair with RW.     Vision Baseline Vision/History: 4 Cataracts Ability to See in Adequate Light: 1 Impaired Patient Visual Report: No change from baseline Vision Assessment?: No apparent visual deficits (other than baseline)                Pertinent Vitals/Pain Pain Assessment Pain Assessment: Faces Faces Pain Scale: No hurt     Hand Dominance Right   Extremity/Trunk Assessment Upper Extremity Assessment Upper Extremity Assessment: Generalized weakness   Lower Extremity Assessment Lower Extremity Assessment: Defer to PT evaluation   Cervical / Trunk Assessment Cervical / Trunk Assessment: Kyphotic   Communication Communication Communication: No difficulties   Cognition Arousal/Alertness: Awake/alert Behavior During Therapy: WFL for tasks assessed/performed Overall Cognitive Status: Within Functional Limits for tasks assessed                                                        Home Living Family/patient expects to be discharged to:: Private residence Living Arrangements: Children;Other relatives Available Help at Discharge: Personal care attendant;Family;Available 24 hours/day Type of Home: Other(Comment) (Double Wide) Home Access: Ramped entrance     Home Layout: One level     Bathroom Shower/Tub: Producer, television/film/video: Handicapped height Bathroom Accessibility: Yes How Accessible: Accessible via walker Home Equipment:  Shower seat;Hand held shower head;Grab bars - tub/shower;Grab bars - toilet;Rolling Walker (2 wheels);Hospital bed;Wheelchair - manual;BSC/3in1   Additional Comments: Per pt report and chart review.      Prior Functioning/Environment Prior Level of Function : Needs assist       Physical Assist : Mobility (physical);ADLs (physical) Mobility (physical): Bed mobility;Transfers;Gait;Stairs ADLs (physical): Bathing;IADLs Mobility Comments: household distances with RW and aide present if assistance needed ADLs Comments: has home aides 8 hours/day x 5 days/week, assist bathing.        OT Problem List: Decreased strength;Decreased activity tolerance;Impaired balance (sitting and/or standing);Cardiopulmonary status limiting activity      OT Treatment/Interventions: Self-care/ADL training;Therapeutic exercise;Therapeutic activities;Patient/family education;Balance training;DME and/or AE instruction    OT Goals(Current goals can be found in the care plan section) Acute Rehab OT Goals Patient Stated Goal: return home OT Goal Formulation: With patient Time For Goal Achievement: 05/12/22 Potential to Achieve Goals: Good  OT Frequency: Min 1X/week                                   End of Session Equipment Utilized During  Treatment: Rolling walker (2 wheels)  Activity Tolerance: Patient tolerated treatment well Patient left: in chair;with call bell/phone within reach  OT Visit Diagnosis: Unsteadiness on feet (R26.81);Other abnormalities of gait and mobility (R26.89);Muscle weakness (generalized) (M62.81)                Time: 6962-9528 OT Time Calculation (min): 15 min Charges:  OT General Charges $OT Visit: 1 Visit OT Evaluation $OT Eval Low Complexity: 1 Low  Insiya Oshea OT, MOT   Danie Chandler 04/28/2022, 12:11 PM

## 2022-04-28 NOTE — Hospital Course (Signed)
[name] is a 88 y.o. female with a history of chronic diastolic heart failure, COPD, IBS, pulmonary fibrosis, hypertension, dementia. Patient presented secondary to weakness and was found to have evidence of influenza A infection with associated COPD exacerbation, heart failure exacerbation and hypoxia. Patient managed with oseltamivir, Lasix and supplemental oxygen with improvement of symptoms. Hospitalization complicated by development of an AKI secondary to over-diuresis which has resolved with adjustment of Lasix. Oxygen use weaned to room air. Patient discharge home with home health.

## 2022-04-29 DIAGNOSIS — J101 Influenza due to other identified influenza virus with other respiratory manifestations: Secondary | ICD-10-CM | POA: Diagnosis not present

## 2022-04-29 LAB — POTASSIUM: Potassium: 4.8 mmol/L (ref 3.5–5.1)

## 2022-04-29 MED ORDER — PREDNISONE 10 MG PO TABS: ORAL_TABLET | ORAL | 0 refills | Status: AC

## 2022-04-29 NOTE — Discharge Instructions (Signed)
[name],  You were in the hospital with influenza infection and a COPD exacerbation. You have improved with treatment. Please continue your breathing treatments and steroid taper.

## 2022-04-29 NOTE — Progress Notes (Signed)
[name] 91% on 1L via nasal cannula. Respiratory increased [name] to 2L.

## 2022-04-29 NOTE — TOC Progression Note (Signed)
Transition of Care [facility]) - Progression Note    Patient Details  Name: [name] MRN: [id number] Date of Birth: November 14, 1934  Transition of Care [facility]) CM/SW Contact  Elliot Gault, LCSW Phone Number: 04/29/2022, 1:25 PM  Clinical Narrative:     TOC following. Pt medically stable for dc today per MD. Spoke with pt's daughter to discuss [facility] for dc. Dtr with concerns about pt's stability for dc. She wonders if pt should go to SNF. CMS provider options reviewed. Will refer as requested. MD updated. TOC will follow.    Barriers to Discharge: Continued Medical Work up  Expected Discharge Plan and Services       Living arrangements for the past 2 months: Single Family Home Expected Discharge Date: 04/29/22                                     Social Determinants of Health (SDOH) Interventions SDOH Screenings   Food Insecurity: No Food Insecurity (04/23/2022)  Housing: Low Risk  (04/23/2022)  Transportation Needs: No Transportation Needs (04/23/2022)  Utilities: Not At Risk (04/23/2022)  Tobacco Use: High Risk (04/23/2022)    Readmission Risk Interventions    09/30/2021    9:22 AM 06/12/2021   12:11 PM  Readmission Risk Prevention Plan  Transportation Screening Complete Complete  PCP or Specialist Appt within 3-5 Days  Complete  HRI or Home Care Consult  Complete  Social Work Consult for Recovery Care Planning/Counseling  Complete  Palliative Care Screening  Not Applicable  Medication Review Oceanographer) Complete Complete  HRI or Home Care Consult Complete   SW Recovery Care/Counseling Consult Complete   Palliative Care Screening Not Applicable   Skilled Nursing Facility Complete

## 2022-04-29 NOTE — [place] (Signed)
[place] MEDICAID [place]2 LEVEL OF CARE FORM     IDENTIFICATION  Patient Name: [name] Birthdate: 1934/10/11 Sex: female Admission Date (Current Location): 04/23/2022  [place] and [place] Number:  [employer] and Address:  [facility],  [address] [place] [address]      Provider Number: [id number]  Attending Physician Name and Address:  Narda Bonds, MD  Relative Name and Phone Number:       Current Level of Care: Hospital Recommended Level of Care: Skilled Nursing Facility Prior Approval Number:    Date Approved/Denied:   PASRR Number: [id number]  Discharge Plan:      Current Diagnoses: Patient Active Problem List   Diagnosis Date Noted   Influenza A 04/28/2022   Hypoxia 04/23/2022   Anemia, unspecified 11/23/2021   Long term (current) use of inhaled steroids 11/23/2021   Unspecified dementia, unspecified severity, with mood disturbance 11/23/2021   Fracture of femoral neck, left, closed 09/24/2021   Acute respiratory failure with hypoxia 06/10/2021   Tobacco use disorder 06/10/2021   Generalized weakness 06/10/2021   Bilateral conjunctivitis 06/10/2021   Chronic diastolic CHF (congestive heart failure) 05/14/2021   Depression 05/14/2021   Hyperlipidemia    COPD exacerbation 05/13/2021   Long term (current) use of opiate analgesic 01/04/2021   Chronic pain 04/23/2019   General unsteadiness 04/23/2019   Polyarthropathy 04/23/2019   Pseudobulbar affect 04/23/2019   Tremor 04/23/2019   Chronic obstructive pulmonary disease, unspecified    Gastro-esophageal reflux disease without esophagitis    Hyperlipidemia, unspecified    Pulmonary fibrosis, unspecified    Acute on chronic diastolic (congestive) heart failure    Age-related osteoporosis without current pathological fracture    Anxiety disorder, unspecified    Chest pain, unspecified    Chronic diastolic (congestive) heart failure    Collapsed vertebra, not elsewhere  classified, site unspecified, subsequent encounter for fracture with routine healing    Edema, unspecified    Lumbago with sciatica    Overactive bladder    Pain in unspecified hand    Pressure ulcer of unspecified site, unspecified stage    Unspecified protein-calorie malnutrition    Acute diastolic heart failure 09/30/2018   Dyspnea 09/29/2018   Pneumonia 06/30/2018   Acute CHF (congestive heart failure) 06/29/2018   Vascular dementia without behavioral disturbance 06/26/2018   CAP (community acquired pneumonia) 06/23/2018   Bilateral pleural effusion 06/23/2018   Leukocytosis 06/20/2018   Chronic anemia 06/20/2018   Dementia 06/19/2018   Bilateral lower extremity edema 06/18/2018   Protein-calorie malnutrition, severe 06/18/2018   S/P internal fixation right hip fracture cannulated screw placement 04/06/18 04/27/2018   Urinary tract infection associated with catheterization of urinary tract 04/11/2018   Altered mental status 04/10/2018   Anxiety 04/10/2018   Hyponatremia 04/10/2018   UTI (urinary tract infection) due to urinary indwelling catheter 04/10/2018   Chronic constipation 04/09/2018   Urinary retention 04/09/2018   Closed right hip fracture 04/05/2018   GERD (gastroesophageal reflux disease) 04/05/2018   COPD with acute exacerbation 04/05/2018   Hiatal hernia    Dyspepsia 04/04/2014   Hematochezia 04/30/2011   RUQ pain 03/25/2011   Adenomatous polyps 03/25/2011   ABDOMINAL PAIN, LEFT LOWER QUADRANT 10/16/2009   HYPERLIPIDEMIA 10/09/2009   BRONCHITIS 10/09/2009   Osteoporosis 10/09/2009   Personal history of pneumonia (recurrent) 01/05/1999   Mixed hyperlipidemia 01/05/1999    Orientation RESPIRATION BLADDER Height & Weight     Self  Normal Incontinent Weight: 105 lb 2.6  oz (47.7 kg) Height:   (142.2 cm)  BEHAVIORAL SYMPTOMS/MOOD NEUROLOGICAL BOWEL NUTRITION STATUS      Continent Diet (see dc summary)  AMBULATORY STATUS COMMUNICATION OF NEEDS Skin    Limited Assist Verbally Normal                       Personal Care Assistance Level of Assistance  Bathing, Feeding, Dressing Bathing Assistance: Limited assistance Feeding assistance: Independent Dressing Assistance: Limited assistance     Functional Limitations Info  Sight, Hearing, Speech Sight Info: Impaired Hearing Info: Impaired Speech Info: Adequate    SPECIAL CARE FACTORS FREQUENCY  PT (By licensed PT), OT (By licensed OT)     PT Frequency: 5x week OT Frequency: 3x week            Contractures Contractures Info: Not present    Additional Factors Info  Code Status, Allergies Code Status Info: DNR Allergies Info: Codeine, Codeine, Gabapentin, Levofloxacin, Sulfonamide Derivatives, Aloe, Doxycycline, Septra (Sulfamethoxazole-trimethoprim), Sulfa Antibiotics, Trimethoprim           Current Medications (04/29/2022):  This is the current hospital active medication list Current Facility-Administered Medications  Medication Dose Route Frequency Provider Last Rate Last Admin   acetaminophen (TYLENOL) tablet 650 mg  650 mg Oral Q6H PRN Marlin Canary U, DO   650 mg at 04/29/22 0952   albuterol (PROVENTIL) (2.5 MG/3ML) 0.083% nebulizer solution 2.5 mg  2.5 mg Nebulization Q4H PRN Joseph Art, DO       ALPRAZolam Prudy Feeler) tablet 0.5 mg  0.5 mg Oral BID Levie Heritage, DO   0.5 mg at 04/29/22 0846   amoxicillin-clavulanate (AUGMENTIN) 500-125 MG per tablet 1 tablet  1 tablet Oral BID Levie Heritage, DO   1 tablet at 04/29/22 4098   diclofenac Sodium (VOLTAREN) 1 % topical gel 4 g  4 g Topical QID Levie Heritage, DO   4 g at 04/29/22 0853   donepezil (ARICEPT) tablet 10 mg  10 mg Oral q morning Levie Heritage, DO   10 mg at 04/29/22 0846   enoxaparin (LOVENOX) injection 30 mg  30 mg Subcutaneous Q24H Levie Heritage, DO   30 mg at 04/28/22 2019   fluticasone furoate-vilanterol (BREO ELLIPTA) 100-25 MCG/ACT 1 puff  1 puff Inhalation Daily Levie Heritage, DO    1 puff at 04/29/22 0830   furosemide (LASIX) tablet 40 mg  40 mg Oral Daily Marlin Canary U, DO   40 mg at 04/29/22 0846   guaiFENesin-dextromethorphan (ROBITUSSIN DM) 100-10 MG/5ML syrup 5 mL  5 mL Oral Q4H PRN Levie Heritage, DO   5 mL at 04/26/22 1804   ipratropium-albuterol (DUONEB) 0.5-2.5 (3) MG/3ML nebulizer solution 3 mL  3 mL Nebulization TID Marlin Canary U, DO   3 mL at 04/29/22 0830   linaclotide (LINZESS) capsule 145 mcg  145 mcg Oral Daily PRN Levie Heritage, DO       loratadine (CLARITIN) tablet 10 mg  10 mg Oral q morning Levie Heritage, DO   10 mg at 04/29/22 0846   memantine (NAMENDA) tablet 10 mg  10 mg Oral BID Levie Heritage, DO   10 mg at 04/29/22 0948   ondansetron (ZOFRAN) tablet 4 mg  4 mg Oral Q6H PRN Levie Heritage, DO       Or   ondansetron [facility]) injection 4 mg  4 mg Intravenous Q6H PRN Levie Heritage, DO  pantoprazole (PROTONIX) EC tablet 40 mg  40 mg Oral BID Levie Heritage, DO   40 mg at 04/29/22 0846   predniSONE (DELTASONE) tablet 40 mg  40 mg Oral Q breakfast Marlin Canary U, DO   40 mg at 04/29/22 0846   simvastatin (ZOCOR) tablet 5 mg  5 mg Oral Daily Levie Heritage, DO   5 mg at 04/29/22 0847   traMADol (ULTRAM) tablet 100 mg  100 mg Oral Q12H PRN Levie Heritage, DO   100 mg at 04/29/22 0441   zolpidem (AMBIEN) tablet 5 mg  5 mg Oral QHS Levie Heritage, DO   5 mg at 04/28/22 2158     Discharge Medications: Please see discharge summary for a list of discharge medications.  Relevant Imaging Results:  Relevant Lab Results:   Additional Information SSN: [id number] [place]

## 2022-04-29 NOTE — Discharge Summary (Addendum)
Physician Discharge Summary   Patient: [name] MRN: [id number] DOB: 1934/06/13  Admit date:     04/23/2022  Discharge date: 04/30/22  Discharge Physician: Jacquelin [facility], MD   PCP: Marylynn Pearson, FNP   Recommendations at discharge:  PCP follow-up  Discharge Diagnoses: Principal Problem:   Influenza A Active Problems:   COPD with acute exacerbation   Dementia   Acute on chronic diastolic (congestive) heart failure   Hypoxia  Resolved Problems:   * No resolved hospital problems. *  Hospital Course: [name] is a 88 y.o. female with a history of chronic diastolic heart failure, COPD, IBS, pulmonary fibrosis, hypertension, dementia. Patient presented secondary to weakness and was found to have evidence of influenza A infection with associated COPD exacerbation, heart failure exacerbation and hypoxia. Patient managed with oseltamivir, Lasix and supplemental oxygen with improvement of symptoms. Hospitalization complicated by development of an AKI secondary to over-diuresis which has resolved with adjustment of Lasix. Oxygen use weaned to room air. Patient discharge home with home health.  Assessment and Plan:  Influenza A infection Patient started on Tamiflu with plan for a 5 day course. Symptoms improved.   Acute respiratory failure with hypoxia Secondary to COPD exacerbation and influenza A infection. Patient weaned to room air.   COPD exacerbation Precipitated by influenza infection. Associated hypoxia requiring oxygen supplementation. Patient completed Augmentin. Continue with prednisone taper on discharge. Continue home Duonebs and Breo Ellipta   AKI Secondary to IV lasix over-diuresis. Improved and stable.   High-normal potassium Secondary to supplementation dnd recent AKI. Resolved.   Acute on chronic diastolic heart failure Normal LVEF. Patient treated with Lasix IV on admission. Resolved.   Dementia Noted. Continue Aricept and Xanax   Consultants:  None Procedures performed:  4/20: Transthoracic Echocardiogram    Disposition: Home health Diet recommendation: Regular diet   DISCHARGE MEDICATION: Allergies as of 04/29/2022       Reactions   Codeine Shortness Of Breath, Rash   Codeine Anaphylaxis   Gabapentin Anaphylaxis   Levofloxacin Anaphylaxis   Patient was admitted to hospital with lung problems due to this medication   Sulfonamide Derivatives Hives, Shortness Of Breath   Aloe Other (See Comments)   Doxycycline Hives   Septra [sulfamethoxazole-trimethoprim]    Sulfa Antibiotics Hives   Trimethoprim         Medication List     STOP taking these medications    amoxicillin-clavulanate 875-125 MG tablet Commonly known as: AUGMENTIN       TAKE these medications    albuterol 108 (90 Base) MCG/ACT inhaler Commonly known as: ProAir HFA Inhale 2 puffs into the lungs every 4 (four) hours as needed for wheezing or shortness of breath. What changed: when to take this   ALPRAZolam 0.5 MG tablet Commonly known as: XANAX Take 1 tablet (0.5 mg total) by mouth 2 (two) times daily.   Breo Ellipta 100-25 MCG/ACT Aepb Generic drug: fluticasone furoate-vilanterol Inhale 1 puff into the lungs every morning.   diclofenac Sodium 1 % Gel Commonly known as: VOLTAREN APPLY AS DIRECTED TO THE AFFECTED KNEE FOUR TIMES DAILY. What changed: See the new instructions.   donepezil 10 MG tablet Commonly known as: ARICEPT Take 10 mg by mouth every morning.   furosemide 40 MG tablet Commonly known as: LASIX Take 40 mg by mouth daily.   ipratropium-albuterol 0.5-2.5 (3) MG/3ML Soln Commonly known as: DUONEB Take 3 mLs by nebulization in the morning, at noon, in the evening, and at bedtime.  Linzess 145 MCG Caps capsule Generic drug: linaclotide Take 145 mcg by mouth daily as needed (bowel movement).   loratadine 10 MG tablet Commonly known as: CLARITIN Take 10 mg by mouth every morning.   memantine 10 MG  tablet Commonly known as: NAMENDA Take 10 mg by mouth 2 (two) times daily.   multivitamin with minerals Tabs tablet Take 1 tablet by mouth daily.   mupirocin ointment 2 % Commonly known as: BACTROBAN Apply 1 Application topically daily.   pantoprazole 40 MG tablet Commonly known as: PROTONIX TAKE (1) TABLET BY MOUTH TWICE A DAY WITH MEALS (BREAKFAST AND SUPPER) What changed: See the new instructions.   polyethylene glycol 17 g packet Commonly known as: MIRALAX / GLYCOLAX Take 17 g by mouth daily.   potassium chloride 10 MEQ tablet Commonly known as: KLOR-CON Take 1 tablet (10 mEq total) by mouth daily.   predniSONE 10 MG tablet Commonly known as: DELTASONE Take 3 tablets (30 mg total) by mouth daily with breakfast for 2 days, THEN 2 tablets (20 mg total) daily with breakfast for 2 days, THEN 1 tablet (10 mg total) daily with breakfast for 2 days. Start taking on: April 30, 2022 What changed: See the new instructions.   simvastatin 10 MG tablet Commonly known as: ZOCOR Take 5 mg by mouth daily.   traMADol 50 MG tablet Commonly known as: ULTRAM Take 1 tablet (50 mg total) by mouth every 8 (eight) hours as needed for moderate pain. What changed:  how much to take when to take this   zolpidem 5 MG tablet Commonly known as: AMBIEN Take 5 mg by mouth at bedtime.        Discharge Exam: BP (!) 138/91   Pulse 94   Temp 98.4 F (36.9 C)   Resp 20   Ht 4\' 8"  (1.422 m)   Wt 47.7 kg   SpO2 93%   BMI 23.58 kg/m   General exam: Appears calm and comfortable Respiratory system: Clear to auscultation. Respiratory effort normal. Cardiovascular system: S1 & S2 heard, RRR. Gastrointestinal system: Abdomen is nondistended, soft and nontender. Normal bowel sounds heard. Central nervous system: Alert and oriented to person and place. Musculoskeletal: No edema. No calf tenderness Skin: No cyanosis. No rashes   Condition at discharge: stable  The results of significant  diagnostics from this hospitalization (including imaging, microbiology, ancillary and laboratory) are listed below for reference.   Imaging Studies: DG CHEST PORT 1 VIEW  Result Date: 04/25/2022 CLINICAL DATA:  Hypoxia EXAM: PORTABLE CHEST 1 VIEW COMPARISON:  04/23/2022 FINDINGS: Patient is rotated. Stable heart size. Large hiatal hernia. Aortic atherosclerosis. Chronic fibrotic lung changes bilaterally. Slightly increased streaky right basilar opacity favors atelectasis. No large pleural fluid collection. No pneumothorax. Numerous chronic bilateral rib deformities. IMPRESSION: 1. Slightly increased streaky right basilar opacity favors atelectasis. 2. Large hiatal hernia. Electronically Signed   By: Duanne Guess D.O.   On: 04/25/2022 09:42   ECHOCARDIOGRAM COMPLETE  Result Date: 04/24/2022    ECHOCARDIOGRAM REPORT   Patient Name:   [name] Date of Exam: 04/24/2022 Medical Rec #:  [id number]     Height:       56.0 in Accession #:    [id number]    Weight:       111.1 lb Date of Birth:  02/05/1934     BSA:          1.383 m Patient Age:    87 years      BP:  138/83 mmHg Patient Gender: F             HR:           90 bpm. [facility] Procedure: 2D Echo, Cardiac Doppler and Color Doppler Indications:    I50.40* Unspecified combined systolic (congestive) and diastolic                 (congestive) heart failure  History:        Patient has prior history of Echocardiogram examinations, most                 recent 09/30/2018. COPD, Signs/Symptoms:Shortness of Breath,                 Dyspnea, Chest Pain, Alzheimer's and Altered Mental Status; Risk                 Factors:Hypertension.  Sonographer:    Sheralyn Boatman RDCS Referring Phys: [id number] JACOB J STINSON IMPRESSIONS  1. Left ventricular ejection fraction, by estimation, is 55 to 60%. The left ventricle has normal function. The left ventricle has no regional wall motion abnormalities. There is moderate asymmetric left ventricular  hypertrophy of the basal-septal segment. Left ventricular diastolic parameters are consistent with Grade I diastolic dysfunction (impaired relaxation).  2. Right ventricular systolic function is normal. The right ventricular size is normal. There is normal pulmonary artery systolic pressure. The estimated right ventricular systolic pressure is 31.5 mmHg.  3. Left atrial size was severely dilated.  4. The mitral valve is degenerative. Trivial mitral valve regurgitation. No evidence of mitral stenosis.  5. The aortic valve is abnormal. There is mild calcification of the aortic valve. There is mild thickening of the aortic valve. Aortic valve regurgitation is not visualized. Aortic valve sclerosis/calcification is present, without any evidence of aortic  stenosis.  6. The inferior vena cava is normal in size with greater than 50% respiratory variability, suggesting right atrial pressure of 3 mmHg. FINDINGS  Left Ventricle: Left ventricular ejection fraction, by estimation, is 55 to 60%. The left ventricle has normal function. The left ventricle has no regional wall motion abnormalities. The left ventricular internal cavity size was normal in size. There is  moderate asymmetric left ventricular hypertrophy of the basal-septal segment. Left ventricular diastolic parameters are consistent with Grade I diastolic dysfunction (impaired relaxation). Right Ventricle: The right ventricular size is normal. No increase in right ventricular wall thickness. Right ventricular systolic function is normal. There is normal pulmonary artery systolic pressure. The tricuspid regurgitant velocity is 2.67 m/s, and  with an assumed right atrial pressure of 3 mmHg, the estimated right ventricular systolic pressure is 31.5 mmHg. Left Atrium: Left atrial size was severely dilated. Right Atrium: Right atrial size was normal in size. Pericardium: There is no evidence of pericardial effusion. Mitral Valve: The mitral valve is degenerative in  appearance. Trivial mitral valve regurgitation. No evidence of mitral valve stenosis. MV peak gradient, 6.7 mmHg. The mean mitral valve gradient is 4.0 mmHg. Tricuspid Valve: The tricuspid valve is normal in structure. Tricuspid valve regurgitation is trivial. No evidence of tricuspid stenosis. Aortic Valve: The aortic valve is abnormal. There is mild calcification of the aortic valve. There is mild thickening of the aortic valve. Aortic valve regurgitation is not visualized. Aortic valve sclerosis/calcification is present, without any evidence  of aortic stenosis. Aortic valve mean gradient measures 8.0 mmHg. Aortic valve peak gradient measures 14.9 mmHg. Aortic valve area, by VTI measures 1.56 cm. Pulmonic Valve:  The pulmonic valve was normal in structure. Pulmonic valve regurgitation is not visualized. No evidence of pulmonic stenosis. Aorta: The aortic root is normal in size and structure. Ascending aorta measurements are within normal limits for age when indexed to body surface area. Venous: The inferior vena cava is normal in size with greater than 50% respiratory variability, suggesting right atrial pressure of 3 mmHg. IAS/Shunts: No atrial level shunt detected by color flow Doppler.  LEFT VENTRICLE PLAX 2D LVIDd:         2.90 cm   Diastology LVIDs:         1.70 cm   LV e' medial:    7.72 cm/s LV PW:         1.10 cm   LV E/e' medial:  12.5 LV IVS:        1.20 cm   LV e' lateral:   3.81 cm/s LVOT diam:     2.20 cm   LV E/e' lateral: 25.4 LV SV:         54 LV SV Index:   39 LVOT Area:     3.80 cm  RIGHT VENTRICLE             IVC RV S prime:     11.90 cm/s  IVC diam: 0.70 cm TAPSE (M-mode): 2.0 cm LEFT ATRIUM             Index        RIGHT ATRIUM           Index LA diam:        3.30 cm 2.39 cm/m   RA Area:     12.40 cm LA Vol (A2C):   30.6 ml 22.13 ml/m  RA Volume:   33.40 ml  24.15 ml/m LA Vol (A4C):   44.7 ml 32.33 ml/m LA Biplane Vol: 38.0 ml 27.48 ml/m  AORTIC VALVE AV Area (Vmax):    1.72 cm AV  Area (Vmean):   1.59 cm AV Area (VTI):     1.56 cm AV Vmax:           193.00 cm/s AV Vmean:          133.000 cm/s AV VTI:            0.345 m AV Peak Grad:      14.9 mmHg AV Mean Grad:      8.0 mmHg LVOT Vmax:         87.50 cm/s LVOT Vmean:        55.700 cm/s LVOT VTI:          0.142 m LVOT/AV VTI ratio: 0.41  AORTA Ao Root diam: 3.70 cm Ao Asc diam:  3.60 cm MITRAL VALVE                TRICUSPID VALVE MV Area (PHT): 4.18 cm     TR Peak grad:   28.5 mmHg MV Area VTI:   1.73 cm     TR Vmax:        267.00 cm/s MV Peak grad:  6.7 mmHg MV Mean grad:  4.0 mmHg     SHUNTS MV Vmax:       1.29 m/s     Systemic VTI:  0.14 m MV Vmean:      93.6 cm/s    Systemic Diam: 2.20 cm MV Decel Time: 182 msec MV E velocity: 96.80 cm/s MV A velocity: 127.00 cm/s MV E/A ratio:  0.76 Weston Brass MD Electronically signed by Weston Brass  MD Signature Date/Time: 04/24/2022/2:41:16 PM    Final    CT Head Wo Contrast  Result Date: 04/23/2022 CLINICAL DATA:  Headache. EXAM: CT HEAD WITHOUT CONTRAST TECHNIQUE: Contiguous axial images were obtained from the base of the skull through the vertex without intravenous contrast. RADIATION DOSE REDUCTION: This exam was performed according to the departmental dose-optimization program which includes automated exposure control, adjustment of the mA and/or kV according to patient size and/or use of iterative reconstruction technique. COMPARISON:  Head CT 09/24/2021 FINDINGS: Brain: There is no evidence of an acute infarct, intracranial hemorrhage, mass, midline shift, or extra-axial fluid collection. Patchy to confluent hypodensities in the cerebral white matter bilaterally are similar to the prior study and are nonspecific but compatible with extensive chronic small vessel ischemic disease. There is mild cerebral atrophy. Vascular: Calcified atherosclerosis at the skull base. No hyperdense vessel. Skull: No fracture or suspicious osseous lesion. Advanced right TMJ arthropathy. Resorption or  postsurgical deficiency of the left mandibular condyle. Sinuses/Orbits: Minimal mucosal thickening in the paranasal sinuses. Increased right mastoid effusion with surrounding sclerosis. Increased right middle ear opacification. Unremarkable orbits. Other: Partially visualized complex superficial structure containing gas in the posterolateral left upper neck, also present previously. IMPRESSION: 1. No evidence of acute intracranial abnormality. 2. Extensive chronic small vessel ischemic disease. 3. Chronic right mastoid and middle ear effusions. Electronically Signed   By: Sebastian Ache M.D.   On: 04/23/2022 14:28   CT Angio Chest PE W/Cm &/Or Wo Cm  Result Date: 04/23/2022 CLINICAL DATA:  Pulmonary embolism (PE) suspected, high prob EXAM: CT ANGIOGRAPHY CHEST WITH CONTRAST TECHNIQUE: Multidetector CT imaging of the chest was performed using the standard protocol during bolus administration of intravenous contrast. Multiplanar CT image reconstructions and MIPs were obtained to evaluate the vascular anatomy. RADIATION DOSE REDUCTION: This exam was performed according to the departmental dose-optimization program which includes automated exposure control, adjustment of the mA and/or kV according to patient size and/or use of iterative reconstruction technique. CONTRAST:  75mL OMNIPAQUE IOHEXOL 350 MG/ML SOLN COMPARISON:  04/19/2022 FINDINGS: Cardiovascular: Satisfactory opacification of the pulmonary arteries to the segmental level. No evidence of pulmonary embolism. Tortuosity of the thoracic aorta without aneurysmal dilation. Atherosclerotic calcifications of the aorta and coronary arteries. Normal heart size. Small pericardial effusion. Mediastinum/Nodes: Large hiatal hernia containing the majority of the stomach within the chest. Prominent mediastinal lymph nodes including 1.4 cm AP window node (series 5, image 110). No axillary or hilar lymphadenopathy. Trachea within normal limits. Lungs/Pleura: Findings of  basilar predominant pulmonary fibrosis. Trace left pleural effusion. Previously described irregular nodule in the left lower lobe appears less solid on the current study. No pneumothorax. Upper Abdomen: Left-sided nephrolithiasis with partially imaged bilateral hydronephrosis. Musculoskeletal: Severe thoracic kyphosis with numerous thoracic vertebral body compression fractures. No new or progressive fracture is seen compared to the recent prior CT. Review of the MIP images confirms the above findings. IMPRESSION: 1. No evidence of pulmonary embolism. 2. Chronic findings of pulmonary fibrosis. 3. Previously described irregular nodule in the left lower lobe appears less solid on the current study. 4. Trace left pleural effusion. 5. Large hiatal hernia containing the majority of the stomach within the chest. 6. Left-sided nephrolithiasis with partially imaged bilateral hydronephrosis. 7. Severe thoracic kyphosis with numerous thoracic vertebral body compression fractures. No new or progressive fracture is seen compared to the recent prior CT. 8. Aortic and coronary artery atherosclerosis (ICD10-I70.0). Electronically Signed   By: Duanne Guess D.O.   On: 04/23/2022 14:21  DG Chest Port 1 View  Result Date: 04/23/2022 CLINICAL DATA:  Weakness. EXAM: PORTABLE CHEST 1 VIEW COMPARISON:  03/25/2022 FINDINGS: Patient is rotated to the right. Lungs are adequately inflated with mild opacification over the right suprahilar region likely prominent vasculature although cannot exclude a pulmonary parenchymal process such as infection. There is evidence of mild vascular congestion. Cardiomediastinal silhouette is otherwise unchanged. Evidence of known moderate size hiatal hernia. Remainder of the exam is unchanged. IMPRESSION: 1. Mild opacification over the right suprahilar region likely prominent vasculature, although cannot exclude a pulmonary parenchymal process such as infection. 2. Mild vascular congestion.  Electronically Signed   By: Elberta Fortis M.D.   On: 04/23/2022 11:08   CT CHEST WO CONTRAST  Result Date: 04/20/2022 CLINICAL DATA:  Emphysema EXAM: CT CHEST WITHOUT CONTRAST TECHNIQUE: Multidetector CT imaging of the chest was performed following the standard protocol without IV contrast. RADIATION DOSE REDUCTION: This exam was performed according to the departmental dose-optimization program which includes automated exposure control, adjustment of the mA and/or kV according to patient size and/or use of iterative reconstruction technique. COMPARISON:  CT chest angio dated June 29, 2018; chest x-ray dated May 13, 2021 FINDINGS: Cardiovascular: Normal heart size. Trace pericardial effusion. Caliber thoracic aorta with moderate atherosclerotic disease. Severe coronary artery calcifications. Mediastinum/Nodes: Large hiatal hernia containing the stomach and a portion of the transverse colon. No enlarged lymph nodes seen in the chest. Lungs/Pleura: Central airways are patent. Basilar lower pleural predominant reticular opacities with associated traction bronchiectasis and honeycomb change. Mild centrilobular emphysema. No consolidation, pleural effusion or pneumothorax. Irregular solid pulmonary nodule of the left lower lobe measuring 6 mm on series 4, image 45. Comparison with prior is limited given presence of acute airspace opacities. Upper Abdomen: No acute abnormality. Musculoskeletal: Severe thoracic kyphosis with multiple moderate and severe compression deformities, similar to compared with prior 2023 chest radiograph. Old bilateral rib fractures. New lucent lesion of T7 located adjacent to the inferior endplate with thin sclerotic border and narrow zone of transition. IMPRESSION: 1. Subpleural and basilar predominant fibrotic interstitial lung disease with honeycomb change. Findings are consistent with UIP per consensus guidelines: Diagnosis of Idiopathic Pulmonary Fibrosis: An Official ATS/ERS/JRS/ALAT  Clinical Practice Guideline. Am Rosezetta Schlatter Crit Care Med Vol 198, Iss 5, [phone], Sep 04 2016. 2. Irregular solid pulmonary nodule of the left lower lobe, possibly focal fibrosis, although pulmonary nodule can not be excluded. Non-contrast chest CT at 6-12 months is recommended. If the nodule is stable at time of repeat CT, then future CT at 18-24 months (from today's scan) is considered optional for low-risk patients, but is recommended for high-risk patients. This recommendation follows the consensus statement: Guidelines for Management of Incidental Pulmonary Nodules Detected on CT Images: From the Fleischner Society 2017; Radiology 2017; 284:228-243. 3. Large hiatal hernia containing the stomach and a portion of the transverse colon. 4. Severe coronary artery calcifications. 5. Severe thoracic kyphotic curvature with multiple moderate and severe compression deformities, similar to compared with prior 2023 chest radiograph. 6. New lucent lesion of T7 located adjacent to the inferior endplate with no aggressive features, possibly an endplate degenerative geode. Recommend attention on follow-up. 7. Aortic Atherosclerosis (ICD10-I70.0) and Emphysema (ICD10-J43.9). Electronically Signed   By: Allegra Lai M.D.   On: 04/20/2022 17:49    Microbiology: Results for orders placed or performed during the hospital encounter of 04/23/22  SARS Coronavirus 2 by RT PCR (hospital order, performed in [facility] hospital lab) *cepheid single result test* Anterior Nasal  Swab     Status: None   Collection Time: 04/23/22 10:31 AM   Specimen: Anterior Nasal Swab  Result Value Ref Range Status   SARS Coronavirus 2 by RT PCR NEGATIVE NEGATIVE Final    Comment: (NOTE) SARS-CoV-2 target nucleic acids are NOT DETECTED.  The SARS-CoV-2 RNA is generally detectable in upper and lower respiratory specimens during the acute phase of infection. The lowest concentration of SARS-CoV-2 viral copies this assay can detect is  250 copies / mL. A negative result does not preclude SARS-CoV-2 infection and should not be used as the sole basis for treatment or other patient management decisions.  A negative result may occur with improper specimen collection / handling, submission of specimen other than nasopharyngeal swab, presence of viral mutation(s) within the areas targeted by this assay, and inadequate number of viral copies (<250 copies / mL). A negative result must be combined with clinical observations, patient history, and epidemiological information.  Fact Sheet for Patients:   RoadLapTop.co.za  Fact Sheet for Healthcare Providers: http://kim-miller.com/  This test is not yet approved or  cleared by the [place] FDA and has been authorized for detection and/or diagnosis of SARS-CoV-2 by FDA under an Emergency Use Authorization (EUA).  This EUA will remain in effect (meaning this test can be used) for the duration of the COVID-19 declaration under Section 564(b)(1) of the Act, 21 U.S.C. section 360bbb-3(b)(1), unless the authorization is terminated or revoked sooner.  Performed at [facility], [address], [place], [place] [address]   Respiratory (~20 pathogens) panel by PCR     Status: Abnormal   Collection Time: 04/24/22 11:20 AM   Specimen: Nasopharyngeal Swab; Respiratory  Result Value Ref Range Status   Adenovirus NOT DETECTED NOT DETECTED Final   Coronavirus 229E NOT DETECTED NOT DETECTED Final    Comment: (NOTE) The Coronavirus on the Respiratory Panel, DOES NOT test for the novel  Coronavirus (2019 nCoV)    Coronavirus HKU1 NOT DETECTED NOT DETECTED Final   Coronavirus NL63 NOT DETECTED NOT DETECTED Final   Coronavirus OC43 NOT DETECTED NOT DETECTED Final   Metapneumovirus NOT DETECTED NOT DETECTED Final   Rhinovirus / Enterovirus NOT DETECTED NOT DETECTED Final   Influenza A H3 DETECTED (A) NOT DETECTED Final   Influenza B NOT  DETECTED NOT DETECTED Final   Parainfluenza Virus 1 NOT DETECTED NOT DETECTED Final   Parainfluenza Virus 2 NOT DETECTED NOT DETECTED Final   Parainfluenza Virus 3 NOT DETECTED NOT DETECTED Final   Parainfluenza Virus 4 NOT DETECTED NOT DETECTED Final   Respiratory Syncytial Virus NOT DETECTED NOT DETECTED Final   Bordetella pertussis NOT DETECTED NOT DETECTED Final   Bordetella Parapertussis NOT DETECTED NOT DETECTED Final   Chlamydophila pneumoniae NOT DETECTED NOT DETECTED Final   Mycoplasma pneumoniae NOT DETECTED NOT DETECTED Final    Comment: Performed at [facility], [address] [place], [place] [address]    Labs: CBC: Recent Labs  Lab 04/23/22 1020 04/26/22 0457 04/28/22 0346  WBC 5.7 11.5* 7.2  NEUTROABS 4.9  --   --   HGB 10.8* 11.2* 11.3*  HCT 34.5* 35.8* 36.7  MCV 91.3 90.9 90.6  PLT 209 222 263   Basic Metabolic Panel: Recent Labs  Lab 04/23/22 1020 04/24/22 0338 04/25/22 0523 04/26/22 0457 04/28/22 0346 04/28/22 0539 04/29/22 0412  NA 132* 134* 135 135 134*  --   --   K 4.6 4.3 4.1 4.9 5.0 5.1 4.8  CL 96* 97* 98  100 100  --   --   CO2 23 26 27 26 26   --   --   GLUCOSE 111* 93 94 97 105*  --   --   BUN 22 24* 31* 25* 29*  --   --   CREATININE 1.12* 1.10* 1.36* 0.99 1.11*  --   --   CALCIUM 8.8* 8.6* 8.6* 8.7* 8.5*  --   --    Liver Function Tests: Recent Labs  Lab 04/23/22 1020  AST 31  ALT 18  ALKPHOS 92  BILITOT 0.4  PROT 6.9  ALBUMIN 3.4*    Discharge time spent: 35 minutes.  Signed: Jacquelin Hawking, MD [facility] 04/29/2022

## 2022-04-29 NOTE — Progress Notes (Signed)
SATURATION QUALIFICATIONS: (This note is used to comply with regulatory documentation for home oxygen)  Patient Saturations on Room Air at Rest = 93%  Patient Saturations on Room Air while Ambulating = 92%  Patient Saturations on 0 Liters of oxygen while Ambulating = 93%  Please briefly explain why patient needs home oxygen: Pt does not qualify for at home oxygen at this time, pt ambulates with walker well approx. 50 feet

## 2022-04-29 NOTE — Progress Notes (Signed)
Patient sating at 99% on 2L. Patient bumped down to 1L sating between 96-98%. Patient taken off of O2. Currently on RA sating at 97%. Will continue to monitor.

## 2022-04-29 NOTE — Care Management Important Message (Signed)
Important Message  Patient Details  Name: [name] MRN: [id number] Date of Birth: 04-Dec-1934   Medicare Important Message Given:  Yes (spoke with daughter Renne Musca at [phone] to review letter, no additional copy needed)     Corey Harold 04/29/2022, 12:58 PM

## 2022-04-29 NOTE — Progress Notes (Signed)
   04/29/22 0800  ReDS Vest / Clip  Station Marker A  Ruler Value 30  ReDS Value Range < 36  ReDS Actual Value 27

## 2022-04-30 NOTE — TOC Transition Note (Signed)
Transition of Care [facility]) - CM/SW Discharge Note   Patient Details  Name: [name] MRN: [id number] Date of Birth: 10/25/1934  Transition of Care [facility] [place]) CM/SW Contact:  Elliot Gault, LCSW Phone Number: 04/30/2022, 9:14 AM   Clinical Narrative:     Pt remains stable for dc. SNF bed offers reviewed with pt's daughter, [name]. [name] states that pt seems better today and is stable on room air so she will take her home with [facility].   [facility] CMS provider options reviewed. Referred to [facility] at dtr request.   No other TOC needs for dc.  Final next level of care: Home w Home Health Services Barriers to Discharge: Barriers Resolved   Patient Goals and CMS Choice CMS Medicare.gov Compare Post Acute Care list provided to:: Patient Represenative (must comment) Choice offered to / list presented to : Adult Children  Discharge Placement                         Discharge Plan and Services Additional resources added to the After Visit Summary for                            [facility] Arranged: PT, OT [facility] Agency: [facility]) Date [facility] Agency Contacted: 04/30/22   Representative spoke with at [facility] Agency: Morrie Sheldon  Social Determinants of Health (SDOH) Interventions SDOH Screenings   Food Insecurity: No Food Insecurity (04/23/2022)  Housing: Low Risk  (04/23/2022)  Transportation Needs: No Transportation Needs (04/23/2022)  Utilities: Not At Risk (04/23/2022)  Tobacco Use: High Risk (04/23/2022)     Readmission Risk Interventions    09/30/2021    9:22 AM 06/12/2021   12:11 PM  Readmission Risk Prevention Plan  Transportation Screening Complete Complete  PCP or Specialist Appt within 3-5 Days  Complete  HRI or Home Care Consult  Complete  Social Work Consult for Recovery Care Planning/Counseling  Complete  Palliative Care Screening  Not Applicable  Medication Review Oceanographer) Complete Complete  HRI or Home Care Consult Complete   SW Recovery Care/Counseling  Consult Complete   Palliative Care Screening Not Applicable   Skilled Nursing Facility Complete

## 2022-04-30 NOTE — Progress Notes (Signed)
Patient ended up staying overnight. Patient continues to remain stable for discharge. Family has decided now to take patient home rather than pursuing SNF discharge.  Jacquelin Hawking, MD [facility] 04/30/2022, 9:44 AM

## 2022-04-30 NOTE — Progress Notes (Signed)
Pt A&O x 3. PRN Robitussin given for cough and PRN Tramadol given for reported pain. Vitals stable.

## 2022-05-03 ENCOUNTER — Encounter: Payer: Medicare Other | Admitting: Physician Assistant

## 2022-05-03 DIAGNOSIS — L89893 Pressure ulcer of other site, stage 3: Secondary | ICD-10-CM | POA: Diagnosis not present

## 2022-05-03 NOTE — Progress Notes (Addendum)
SELICIA, WINDOM ([id number]) [id number] Page 1 of 7 Visit Report for 05/03/2022 Arrival Information Details Patient Name: Date of Service: DA Seth Bake IS, Jeweliana L. 05/03/2022 11:00 A M Medical Record Number: [id number] Patient Account Number: [id number] Date of Birth/Sex: Treating RN: 1934/12/23 (88 y.o. [name] Primary Care Naythen Heikkila: Marylynn Pearson Other Clinician: Referring Belisa Eichholz: Treating Tyrihanna Wingert/Extender: Lorna Dibble Weeks in Treatment: 10 Visit Information History Since Last Visit Added or deleted any medications: No Patient Arrived: Ambulatory Any new allergies or adverse reactions: No Arrival Time: 11:03 Had a fall or experienced change in No Accompanied By: daughter activities of daily living that may affect Transfer Assistance: None risk of falls: Patient Identification Verified: Yes Signs or symptoms of abuse/neglect since last visito No Secondary Verification Process Completed: Yes Hospitalized since last visit: No Patient Requires Transmission-Based Precautions: No Has Dressing in Place as Prescribed: Yes Patient Has Alerts: No Pain Present Now: No Electronic Signature(s) Signed: 05/03/2022 11:25:56 AM By: Yevonne Pax RN Entered By: Yevonne Pax on 05/03/2022 11:04:07 -------------------------------------------------------------------------------- Clinic Level of Care Assessment Details Patient Name: Date of Service: [name], Ryelynn L. 05/03/2022 11:00 A M Medical Record Number: [id number] Patient Account Number: [id number] Date of Birth/Sex: Treating RN: [birth year] (88 y.o. [name] Primary Care Dashaun Onstott: Marylynn Pearson Other Clinician: Referring Karlee Staff: Treating September Mormile/Extender: Lorna Dibble Weeks in Treatment: 10 Clinic Level of Care Assessment Items TOOL 4 Quantity Score X- 1 0 Use when only an EandM is performed on FOLLOW-UP visit ASSESSMENTS - Nursing Assessment / Reassessment X-  1 10 Reassessment of Co-morbidities (includes updates in patient status) X- 1 5 Reassessment of Adherence to Treatment Plan ASSESSMENTS - Wound and Skin A ssessment / Reassessment X - Simple Wound Assessment / Reassessment - one wound 1 5 []  - 0 Complex Wound Assessment / Reassessment - multiple wounds []  - 0 Dermatologic / Skin Assessment (not related to wound area) ASSESSMENTS - Focused Assessment []  - 0 Circumferential Edema Measurements - multi extremities []  - 0 Nutritional Assessment / Counseling / Intervention []  - 0 Lower Extremity Assessment (monofilament, tuning fork, pulses) []  - 0 Peripheral Arterial Disease Assessment (using hand held doppler) ASSESSMENTS - Ostomy and/or Continence Assessment and Care []  - 0 Incontinence Assessment and Management []  - 0 Ostomy Care Assessment and Management (repouching, etc.) PROCESS - Coordination of Care X - Simple Patient / Family Education for ongoing care 1 15 []  - 0 Complex (extensive) Patient / Family Education for ongoing care DATHA, KISSINGER ([id number]) [id number] Page 2 of 7 []  - 0 Staff obtains Consents, Records, T Results / Process Orders est []  - 0 Staff telephones HHA, Nursing Homes / Clarify orders / etc []  - 0 Routine Transfer to another Facility (non-emergent condition) []  - 0 Routine Hospital Admission (non-emergent condition) []  - 0 New Admissions / Manufacturing engineer / Ordering NPWT Apligraf, etc. , []  - 0 Emergency Hospital Admission (emergent condition) X- 1 10 Simple Discharge Coordination []  - 0 Complex (extensive) Discharge Coordination PROCESS - Special Needs []  - 0 Pediatric / Minor Patient Management []  - 0 Isolation Patient Management []  - 0 Hearing / Language / Visual special needs []  - 0 Assessment of Community assistance (transportation, D/C planning, etc.) []  - 0 Additional assistance / Altered mentation []  - 0 Support Surface(s) Assessment (bed,  cushion, seat, etc.) INTERVENTIONS - Wound Cleansing / Measurement X - Simple Wound Cleansing - one wound 1 5 []  - 0 Complex Wound Cleansing - multiple wounds X- 1  5 Wound Imaging (photographs - any number of wounds) []  - 0 Wound Tracing (instead of photographs) X- 1 5 Simple Wound Measurement - one wound []  - 0 Complex Wound Measurement - multiple wounds INTERVENTIONS - Wound Dressings []  - 0 Small Wound Dressing one or multiple wounds []  - 0 Medium Wound Dressing one or multiple wounds []  - 0 Large Wound Dressing one or multiple wounds []  - 0 Application of Medications - topical []  - 0 Application of Medications - injection INTERVENTIONS - Miscellaneous []  - 0 External ear exam []  - 0 Specimen Collection (cultures, biopsies, blood, body fluids, etc.) []  - 0 Specimen(s) / Culture(s) sent or taken to Lab for analysis []  - 0 Patient Transfer (multiple staff / Nurse, adult / Similar devices) []  - 0 Simple Staple / Suture removal (25 or less) []  - 0 Complex Staple / Suture removal (26 or more) []  - 0 Hypo / Hyperglycemic Management (close monitor of Blood Glucose) []  - 0 Ankle / Brachial Index (ABI) - do not check if billed separately X- 1 5 Vital Signs Has the patient been seen at the hospital within the last three years: Yes Total Score: 65 Level Of Care: New/Established - Level 2 Electronic Signature(s) Unsigned Entered ByYevonne Pax on 05/03/2022 12:33:52 Signature(s): Duggar, Renford Dills ([id number]) [id number] Date(s): [id number] Page 3 of 7 -------------------------------------------------------------------------------- Encounter Discharge Information Details Patient Name: Date of Service: [name], Georgiana L. 05/03/2022 11:00 A M Medical Record Number: [id number] Patient Account Number: [id number] Date of Birth/Sex: Treating RN: 15-May-1934 (88 y.o. [name] Primary Care Chonte Ricke: Marylynn Pearson Other Clinician: Referring  Loreley Schwall: Treating Aisley Whan/Extender: Lorna Dibble Weeks in Treatment: 10 Encounter Discharge Information Items Discharge Condition: Stable Ambulatory Status: Ambulatory Discharge Destination: Home Transportation: Private Auto Accompanied By: daughter Schedule Follow-up Appointment: Yes Clinical Summary of Care: Electronic Signature(s) Signed: 05/03/2022 12:36:06 PM By: Yevonne Pax RN Entered By: Yevonne Pax on 05/03/2022 12:36:05 -------------------------------------------------------------------------------- Lower Extremity Assessment Details Patient Name: Date of Service: [name], Angelamarie L. 05/03/2022 11:00 A M Medical Record Number: [id number] Patient Account Number: [id number] Date of Birth/Sex: Treating RN: 10/18/1934 (88 y.o. [name] Primary Care Aarib Pulido: Marylynn Pearson Other Clinician: Referring Arhum Peeples: Treating Kachina Niederer/Extender: Lorna Dibble Weeks in Treatment: 10 Edema Assessment Assessed: [Left: No] [Right: No] Edema: [Left: N] [Right: o] Vascular Assessment Pulses: Dorsalis Pedis Palpable: [Left:Yes] Electronic Signature(s) Signed: 05/03/2022 11:25:56 AM By: Yevonne Pax RN Entered By: Yevonne Pax on 05/03/2022 11:08:03 -------------------------------------------------------------------------------- Multi Wound Chart Details Patient Name: Date of Service: [name], Aviya L. 05/03/2022 11:00 A M Medical Record Number: [id number] Patient Account Number: [id number] Date of Birth/Sex: Treating RN: [birth year] (88 y.o. [name] Primary Care Deyon Chizek: Marylynn Pearson Other Clinician: Referring Esvin Hnat: Treating Bronsyn Shappell/Extender: Lorna Dibble Weeks in Treatment: 10 Vital Signs Height(in): 62 Pulse(bpm): 78 Weight(lbs): 105 Blood Pressure(mmHg): 138/78 Body Mass Index(BMI): 19.2 Temperature(F): 98.2 Respiratory Rate(breaths/min): 18 [Deblasi, Jamylah L ([id number]):Photos:]  [[id number] Page 4 of 7:1 N/A N/A N/A N/A] Left, Distal, Medial Foot N/A N/A Wound Location: Gradually Appeared N/A N/A Wounding Event: Pressure Ulcer N/A N/A Primary Etiology: Chronic Obstructive Pulmonary N/A N/A Comorbid History: Disease (COPD), Congestive Heart Failure, Dementia 01/22/2022 N/A N/A Date Acquired: 10 N/A N/A Weeks of Treatment: Open N/A N/A Wound Status: No N/A N/A Wound Recurrence: 0.4x0.3x0.2 N/A N/A Measurements L x W x D (cm) 0.094 N/A N/A A (cm) : rea 0.019 N/A N/A Volume (cm) : -32.40% N/A N/A % Reduction in  A rea: -35.70% N/A N/A % Reduction in Volume: Category/Stage III N/A N/A Classification: Medium N/A N/A Exudate A mount: Serosanguineous N/A N/A Exudate Type: red, brown N/A N/A Exudate Color: Small (1-33%) N/A N/A Granulation A mount: Pink, Pale N/A N/A Granulation Quality: Large (67-100%) N/A N/A Necrotic A mount: Fat Layer (Subcutaneous Tissue): Yes N/A N/A Exposed Structures: Fascia: No Tendon: No Muscle: No Joint: No Bone: No Medium (34-66%) N/A N/A Epithelialization: Treatment Notes Electronic Signature(s) Signed: 05/03/2022 11:25:56 AM By: Yevonne Pax RN Entered By: Yevonne Pax on 05/03/2022 11:08:08 -------------------------------------------------------------------------------- Multi-Disciplinary Care Plan Details Patient Name: Date of Service: [name], Toluwani L. 05/03/2022 11:00 A M Medical Record Number: [id number] Patient Account Number: [id number] Date of Birth/Sex: Treating RN: 1934-12-07 (88 y.o. [name] Primary Care Esiquio Boesen: Marylynn Pearson Other Clinician: Referring Savan Ruta: Treating Smiley Birr/Extender: Lorna Dibble Weeks in Treatment: 10 Active Inactive Wound/Skin Impairment Nursing Diagnoses: Knowledge deficit related to ulceration/compromised skin integrity Goals: Patient/caregiver will verbalize understanding of skin care regimen Date  Initiated: 02/22/2022 Date Inactivated: 04/19/2022 Target Resolution Date: 04/23/2022 Goal Status: Met Ulcer/skin breakdown will have a volume reduction of 30% by week 4 Date Initiated: 02/22/2022 Date Inactivated: 04/05/2022 Target Resolution Date: 03/23/2022 Goal Status: Unmet Unmet Reason: comorbities Ulcer/skin breakdown will have a volume reduction of 50% by week 8 Date Initiated: 02/22/2022 Date Inactivated: 04/19/2022 Target Resolution Date: 04/19/2022 Unmet Reason: underlying conditions, Goal Status: Unmet Fleig, Christyn L ([id number]) [id number] Page 5 of 7 Goal Status: Unmet wound improving Ulcer/skin breakdown will have a volume reduction of 80% by week 12 Date Initiated: 02/22/2022 Target Resolution Date: 05/23/2022 Goal Status: Active Ulcer/skin breakdown will heal within 14 weeks Date Initiated: 02/22/2022 Target Resolution Date: 06/23/2022 Goal Status: Active Interventions: Assess patient/caregiver ability to obtain necessary supplies Assess patient/caregiver ability to perform ulcer/skin care regimen upon admission and as needed Assess ulceration(s) every visit Notes: Electronic Signature(s) Signed: 05/03/2022 11:25:56 AM By: Yevonne Pax RN Entered By: Yevonne Pax on 05/03/2022 11:08:20 -------------------------------------------------------------------------------- Pain Assessment Details Patient Name: Date of Service: [name], Namiyah L. 05/03/2022 11:00 A M Medical Record Number: [id number] Patient Account Number: [id number] Date of Birth/Sex: Treating RN: [birth year] (88 y.o. [name] Primary Care Cassiel Fernandez: Marylynn Pearson Other Clinician: Referring Raun Routh: Treating Ezma Rehm/Extender: Lorna Dibble Weeks in Treatment: 10 Active Problems Location of Pain Severity and Description of Pain Patient Has Paino No Site Locations Pain Management and Medication Current Pain Management: Electronic Signature(s) Signed:  05/03/2022 11:25:56 AM By: Yevonne Pax RN Entered By: Yevonne Pax on 05/03/2022 11:05:15 -------------------------------------------------------------------------------- Patient/Caregiver Education Details Patient Name: Date of Service: [name], Aysha L. 4/29/2024andnbsp11:00 A M Medical Record Number: [id number] Patient Account Number: [id number] Date of Birth/Gender: Treating RN: 1934-11-23 (88 y.o. [name] Primary Care Physician: Marylynn Pearson Other Clinician: Referring Physician: Treating Physician/Extender: Lorna Dibble Weeks in Treatment: 10 Smisek, Meyah L ([id number]) [id number] Page 6 of 7 Education Assessment Education Provided To: Patient Education Topics Provided Wound/Skin Impairment: Handouts: Caring for Your Ulcer Methods: Explain/Verbal Responses: State content correctly Electronic Signature(s) Signed: 05/03/2022 11:25:56 AM By: Yevonne Pax RN Entered By: Yevonne Pax on 05/03/2022 11:08:33 -------------------------------------------------------------------------------- Wound Assessment Details Patient Name: Date of Service: [name], Haddie L. 05/03/2022 11:00 A M Medical Record Number: [id number] Patient Account Number: [id number] Date of Birth/Sex: Treating RN: 1934-11-22 (88 y.o. [name] Primary Care Lakethia Coppess: Marylynn Pearson Other Clinician: Referring Elio Haden: Treating Wilbon Obenchain/Extender: Lorna Dibble Weeks in Treatment: 10 Wound Status Wound Number:  1 Primary Pressure Ulcer Etiology: Wound Location: Left, Distal, Medial Foot Wound Open Wounding Event: Gradually Appeared Status: Date Acquired: 01/22/2022 Comorbid Chronic Obstructive Pulmonary Disease (COPD), Congestive Weeks Of Treatment: 10 History: Heart Failure, Dementia Clustered Wound: No Photos Wound Measurements Length: (cm) 0.4 Width: (cm) 0.3 Depth: (cm) 0.2 Area: (cm) 0.094 Volume: (cm) 0.019 % Reduction in  Area: -32.4% % Reduction in Volume: -35.7% Epithelialization: Medium (34-66%) Tunneling: No Undermining: No Wound Description Classification: Category/Stage III Exudate Amount: Medium Exudate Type: Serosanguineous Exudate Color: red, brown Foul Odor After Cleansing: No Slough/Fibrino Yes Wound Bed Granulation Amount: Small (1-33%) Exposed Structure Granulation Quality: Pink, Pale Fascia Exposed: No Necrotic Amount: Large (67-100%) Fat Layer (Subcutaneous Tissue) Exposed: Yes Necrotic Quality: Adherent Slough Tendon Exposed: No Muscle Exposed: No Joint Exposed: No Bone Exposed: No Dubreuil, Vici L ([id number]) [id number] Page 7 of 7 Treatment Notes Wound #1 (Foot) Wound Laterality: Left, Medial, Distal Cleanser Byram Ancillary Kit - 15 Day Supply Discharge Instruction: Use supplies as instructed; Kit contains: (15) Saline Bullets; (15) 3x3 Gauze; 15 pr Gloves Soap and Water Discharge Instruction: Gently cleanse wound with antibacterial soap, rinse and pat dry prior to dressing wounds Peri-Wound Care Topical Primary Dressing Endoform Natural, Non-fenestrated, 2x2 (in/in) Secondary Dressing (BORDER) Zetuvit Plus SILICONE BORDER Dressing 4x4 (in/in) Discharge Instruction: Please do not put silicone bordered dressings under wraps. Use non-bordered dressing only. Secured With Compression Wrap Compression Stockings Facilities manager) Signed: 05/03/2022 11:25:56 AM By: Yevonne Pax RN Entered By: Yevonne Pax on 05/03/2022 11:07:43 -------------------------------------------------------------------------------- Vitals Details Patient Name: Date of Service: [name], Madolin L. 05/03/2022 11:00 A M Medical Record Number: [id number] Patient Account Number: [id number] Date of Birth/Sex: Treating RN: Nov 30, 1934 (88 y.o. [name] Primary Care Braxden Lovering: Marylynn Pearson Other Clinician: Referring Keison Glendinning: Treating Jamesyn Moorefield/Extender: Lorna Dibble Weeks in Treatment: 10 Vital Signs Time Taken: 11:04 Temperature (F): 98.2 Height (in): 62 Pulse (bpm): 78 Weight (lbs): 105 Respiratory Rate (breaths/min): 18 Body Mass Index (BMI): 19.2 Blood Pressure (mmHg): 138/78 Reference Range: 80 - 120 mg / dl Electronic Signature(s) Signed: 05/03/2022 11:25:56 AM By: Yevonne Pax RN Entered By: Yevonne Pax on 05/03/2022 11:05:10

## 2022-05-03 NOTE — Progress Notes (Signed)
[name] ([id number]) [id number] Page 1 of 6 Visit Report for 05/03/2022 Chief Complaint Document Details Patient Name: Date of Service: Lacey Seth Bake [name], [name] 05/03/2022 11:00 A M Medical Record Number: [id number] Patient Account Number: [id number] Date of Birth/Sex: Treating RN: May 15, 1934 (88 y.o. [name] Primary Care Provider: Marylynn [name] Other Clinician: Referring Provider: Treating Provider/Extender: [name] Weeks in Treatment: 10 Information Obtained from: Patient Chief Complaint Left foot pressure ulcer Electronic Signature(s) Signed: 05/03/2022 10:56:09 AM By: Lacey Derry PA-C Entered By: [name] on 05/03/2022 10:56:09 -------------------------------------------------------------------------------- HPI Details Patient Name: Date of Service: Lacey V [name], [name] 05/03/2022 11:00 A M Medical Record Number: [id number] Patient Account Number: [id number] Date of Birth/Sex: Treating RN: 1934/04/18 (88 y.o. [name] Primary Care Provider: Marylynn [name] Other Clinician: Referring Provider: Treating Provider/Extender: [name] Weeks in Treatment: 10 History of Present Illness HPI Description: 02-22-2022 upon evaluation today patient appears to be doing somewhat poorly in regard to her left medial first metatarsal head where she does have a bunion. Unfortunately this [name] causing some pressure and has led to a pressure injury at this site. Fortunately I do not see any signs of infection at this point but that [name] something we definitely can need to keep a close eye on. Fortunately there does not appear to be any signs of active infection locally nor systemically at this point. No fevers, chills, nausea, vomiting, or diarrhea. Patient does have a history of of vascular dementia, COPD, congestive heart failure, and the dementia does affect her ability to be able to in some cases follow directions. She  [name] very nervous about things in general. This [name] stated to have started somewhere around January 22, 2022. 03-02-2022 upon evaluation today patient appears to be doing well currently in regard to her wounds all things considered. Fortunately I do not see any signs of infection unfortunately both wounds are still open and the one on the medial foot first metatarsal location [name] still the worst. Although the area over the fifth toe right foot actually [name] also open at this point unfortunately. With that being said I do think we can need to continue to monitor for any signs of worsening or infection although right now I think she [name] doing okay in that regard. 3/4; this [name] a patient with a bunion wound on the left medial foot. She also had a small blister on the right lateral fifth toe. She went for x-rays last week no acute fracture or dislocation was seen on the left side she had worsened moderate hallucis valgus and moderate second and third fourth fifth tarsometatarsal osteoarthritis. We use Prisma. 3/11; patient presents for follow-up. She has been using endoform to the left medial foot wound. She has cushion pads that she uses in between the toes to help offload the areas and prevent future wounds. The right lateral fifth met head remains closed. 03-22-2022 upon evaluation today patient appears to be doing well currently in regard to her wound. She does show some signs of improvement here. She [name] also showing some signs of being a little bit too moist I think we may want to cut back on the hydrogel possibly just using the saline only at this point she [name] in agreement with that plan. Fortunately I do not see any evidence of active infection locally nor systemically which [name] great news. No fevers, chills, nausea, vomiting, or diarrhea. 03-29-22 upon evaluation today patient actually appears to be making  excellent progress and very pleased with how things appear and how the wound [name] doing. I do think that  we are moving in the right direction and very pleased in that regard. No fevers, chills, nausea, vomiting, or diarrhea. 04-05-2022 upon evaluation today patient appears to be doing well currently in regard to her wound. This [name] actually showing signs of improvement. Fortunately there does not appear to be any signs of active infection locally nor systemically at this time which [name] great news. No fevers, chills, nausea, vomiting, or diarrhea. 04-12-2022 upon evaluation today patient's wound actually showing signs of some improvement. Fortunately there does not appear to be any evidence of active infection locally nor systemically which [name] great news. No fevers, chills, nausea, vomiting, or diarrhea. 04-19-2022 upon evaluation today patient actually appears to be doing quite well in regard to her wound. She has been tolerating the dressing changes without complication. Fortunately there does not appear to be any signs of active infection locally or systemically which [name] great news. No fevers, chills, nausea, vomiting, or diarrhea. 05-03-2022 upon evaluation today patient appears to be doing better in regard to her foot ulcer this [name] actually very close to complete resolution. Fortunately I do not see any signs of active infection locally nor systemically. [name] ([id number]) [id number] Page 2 of 6 Electronic Signature(s) Signed: 05/03/2022 6:25:05 PM By: Lacey Derry PA-C Entered By: [name] on 05/03/2022 18:25:05 -------------------------------------------------------------------------------- Physical Exam Details Patient Name: Date of Service: Lacey V [name], Makelle L. 05/03/2022 11:00 A M Medical Record Number: [id number] Patient Account Number: [id number] Date of Birth/Sex: Treating RN: 1934-04-16 (88 y.o. [name] Primary Care Provider: Marylynn [name] Other Clinician: Referring Provider: Treating Provider/Extender: [name] Weeks in  Treatment: 10 Constitutional Well-nourished and well-hydrated in no acute distress. Respiratory normal breathing without difficulty. Psychiatric this patient [name] able to make decisions and demonstrates good insight into disease process. Alert and Oriented x 3. pleasant and cooperative. Notes Upon inspection the wound bed actually showed signs of good granulation epithelization at this point. Fortunately I do not see patient any signs of worsening and in fact I think this [name] actually closing up quite nicely and hoping that this may heal shortly. Electronic Signature(s) Signed: 05/03/2022 6:25:49 PM By: Lacey Derry PA-C Entered By: [name] on 05/03/2022 18:25:49 -------------------------------------------------------------------------------- Physician Orders Details Patient Name: Date of Service: Lacey V [name], [name] 05/03/2022 11:00 A M Medical Record Number: [id number] Patient Account Number: [id number] Date of Birth/Sex: Treating RN: 1934/10/24 (88 y.o. [name] Primary Care Provider: Marylynn [name] Other Clinician: Referring Provider: Treating Provider/Extender: [name] Weeks in Treatment: 10 Verbal / Phone Orders: No Diagnosis Coding ICD-10 Coding Code Description [phone] Pressure ulcer of other site, stage 3 I50.42 Chronic combined systolic (congestive) and diastolic (congestive) heart failure F01.511 Vascular dementia, unspecified severity, with agitation J44.9 Chronic obstructive pulmonary disease, unspecified Follow-up Appointments Return Appointment in 1 week. Bathing/ [employer] wounds with antibacterial soap and water. Anesthetic (Use 'Patient Medications' Section for Anesthetic Order Entry) Lidocaine applied to wound bed Edema Control - Lymphedema / Segmental Compressive Device / Other Elevate, Exercise Daily and A void Standing for Long Periods of Time. Elevate legs to the level of the heart and pump ankles as often as  possible Elevate leg(s) parallel to the floor when sitting. Wound Treatment Wound #1 - Foot Wound Laterality: Left, Medial, Distal Cleanser: Byram Ancillary Kit - 15 Day Supply (Generic) 3 x Per Week/30 Days  Discharge Instructions: Use supplies as instructed; Kit contains: (15) Saline Bullets; (15) 3x3 Gauze; 15 pr Gloves [name], Lacey L ([id number]) [id number] Page 3 of 6 Cleanser: Soap and Water 3 x Per Week/30 Days Discharge Instructions: Gently cleanse wound with antibacterial soap, rinse and pat dry prior to dressing wounds Prim Dressing: Endoform Natural, Non-fenestrated, 2x2 (in/in) (Dispense As Written) 3 x Per Week/30 Days ary Secondary Dressing: (BORDER) Zetuvit Plus SILICONE BORDER Dressing 4x4 (in/in) (Dispense As Written) 3 x Per Week/30 Days Discharge Instructions: Please do not put silicone bordered dressings under wraps. Use non-bordered dressing only. Electronic Signature(s) Signed: 05/03/2022 4:31:57 PM By: Yevonne Pax RN Signed: 05/03/2022 6:40:35 PM By: Lacey Derry PA-C Entered By: Yevonne Pax on 05/03/2022 11:49:28 -------------------------------------------------------------------------------- Problem List Details Patient Name: Date of Service: Lacey V [name], [name] 05/03/2022 11:00 A M Medical Record Number: [id number] Patient Account Number: [id number] Date of Birth/Sex: Treating RN: 1934-01-14 (88 y.o. [name] Primary Care Provider: Marylynn [name] Other Clinician: Referring Provider: Treating Provider/Extender: [name] Weeks in Treatment: 10 Active Problems ICD-10 Encounter Code Description Active Date MDM Diagnosis L89.893 Pressure ulcer of other site, stage 3 02/22/2022 No Yes I50.42 Chronic combined systolic (congestive) and diastolic (congestive) heart failure 02/22/2022 No Yes F01.511 Vascular dementia, unspecified severity, with agitation 02/22/2022 No Yes J44.9 Chronic obstructive pulmonary disease,  unspecified 02/22/2022 No Yes Inactive Problems Resolved Problems Electronic Signature(s) Signed: 05/03/2022 10:56:05 AM By: Lacey Derry PA-C Entered By: [name] on 05/03/2022 10:56:05 -------------------------------------------------------------------------------- Progress Note Details Patient Name: Date of Service: Lacey V [name], [name] 05/03/2022 11:00 A M Medical Record Number: [id number] Patient Account Number: [id number] Date of Birth/Sex: Treating RN: [birth year] (88 y.o. [name] Primary Care Provider: Marylynn [name] Other Clinician: Referring Provider: Treating Provider/Extender: [name] Weeks in Treatment: [address] Subjective Chief Complaint Chauncey, Renford Dills ([id number]) [id number] Page 4 of 6 Information obtained from Patient Left foot pressure ulcer History of Present Illness (HPI) 02-22-2022 upon evaluation today patient appears to be doing somewhat poorly in regard to her left medial first metatarsal head where she does have a bunion. Unfortunately this [name] causing some pressure and has led to a pressure injury at this site. Fortunately I do not see any signs of infection at this point but that [name] something we definitely can need to keep a close eye on. Fortunately there does not appear to be any signs of active infection locally nor systemically at this point. No fevers, chills, nausea, vomiting, or diarrhea. Patient does have a history of of vascular dementia, COPD, congestive heart failure, and the dementia does affect her ability to be able to in some cases follow directions. She [name] very nervous about things in general. This [name] stated to have started somewhere around January 22, 2022. 03-02-2022 upon evaluation today patient appears to be doing well currently in regard to her wounds all things considered. Fortunately I do not see any signs of infection unfortunately both wounds are still open and the one on the medial foot  first metatarsal location [name] still the worst. Although the area over the fifth toe right foot actually [name] also open at this point unfortunately. With that being said I do think we can need to continue to monitor for any signs of worsening or infection although right now I think she [name] doing okay in that regard. 3/4; this [name] a patient with a bunion wound on the left medial foot. She also had a small blister on the right  lateral fifth toe. She went for x-rays last week no acute fracture or dislocation was seen on the left side she had worsened moderate hallucis valgus and moderate second and third fourth fifth tarsometatarsal osteoarthritis. We use Prisma. 3/11; patient presents for follow-up. She has been using endoform to the left medial foot wound. She has cushion pads that she uses in between the toes to help offload the areas and prevent future wounds. The right lateral fifth met head remains closed. 03-22-2022 upon evaluation today patient appears to be doing well currently in regard to her wound. She does show some signs of improvement here. She [name] also showing some signs of being a little bit too moist I think we may want to cut back on the hydrogel possibly just using the saline only at this point she [name] in agreement with that plan. Fortunately I do not see any evidence of active infection locally nor systemically which [name] great news. No fevers, chills, nausea, vomiting, or diarrhea. 03-29-22 upon evaluation today patient actually appears to be making excellent progress and very pleased with how things appear and how the wound [name] doing. I do think that we are moving in the right direction and very pleased in that regard. No fevers, chills, nausea, vomiting, or diarrhea. 04-05-2022 upon evaluation today patient appears to be doing well currently in regard to her wound. This [name] actually showing signs of improvement. Fortunately there does not appear to be any signs of active infection locally nor  systemically at this time which [name] great news. No fevers, chills, nausea, vomiting, or diarrhea. 04-12-2022 upon evaluation today patient's wound actually showing signs of some improvement. Fortunately there does not appear to be any evidence of active infection locally nor systemically which [name] great news. No fevers, chills, nausea, vomiting, or diarrhea. 04-19-2022 upon evaluation today patient actually appears to be doing quite well in regard to her wound. She has been tolerating the dressing changes without complication. Fortunately there does not appear to be any signs of active infection locally or systemically which [name] great news. No fevers, chills, nausea, vomiting, or diarrhea. 05-03-2022 upon evaluation today patient appears to be doing better in regard to her foot ulcer this [name] actually very close to complete resolution. Fortunately I do not see any signs of active infection locally nor systemically. Objective Constitutional Well-nourished and well-hydrated in no acute distress. Vitals Time Taken: 11:04 AM, Height: 62 in, Weight: 105 lbs, BMI: 19.2, Temperature: 98.2 F, Pulse: 78 bpm, Respiratory Rate: 18 breaths/min, Blood Pressure: 138/78 mmHg. Respiratory normal breathing without difficulty. Psychiatric this patient [name] able to make decisions and demonstrates good insight into disease process. Alert and Oriented x 3. pleasant and cooperative. General Notes: Upon inspection the wound bed actually showed signs of good granulation epithelization at this point. Fortunately I do not see patient any signs of worsening and in fact I think this [name] actually closing up quite nicely and hoping that this may heal shortly. Integumentary (Hair, Skin) Wound #1 status [name] Open. Original cause of wound was Gradually Appeared. The date acquired was: 01/22/2022. The wound has been in treatment 10 weeks. The wound [name] located on the Left,Distal,Medial Foot. The wound measures 0.4cm length x 0.3cm width x  0.2cm depth; 0.094cm^2 area and 0.019cm^3 volume. There [name] Fat Layer (Subcutaneous Tissue) exposed. There [name] no tunneling or undermining noted. There [name] a medium amount of serosanguineous drainage noted. There [name] small (1-33%) pink, pale granulation within the wound bed. There [name] a large (  67-100%) amount of necrotic tissue within the wound bed including Adherent Slough. Assessment Active Problems ICD-10 Pressure ulcer of other site, stage 3 Chronic combined systolic (congestive) and diastolic (congestive) heart failure [name], Lacey L ([id number]) [id number] Page 5 of 6 Vascular dementia, unspecified severity, with agitation Chronic obstructive pulmonary disease, unspecified Plan Follow-up Appointments: Return Appointment in 1 week. Bathing/ Shower/ Hygiene: Wash wounds with antibacterial soap and water. Anesthetic (Use 'Patient Medications' Section for Anesthetic Order Entry): Lidocaine applied to wound bed Edema Control - Lymphedema / Segmental Compressive Device / Other: Elevate, Exercise Daily and Avoid Standing for Long Periods of Time. Elevate legs to the level of the heart and pump ankles as often as possible Elevate leg(s) parallel to the floor when sitting. WOUND #1: - Foot Wound Laterality: Left, Medial, Distal Cleanser: [employer] Ancillary Kit - 15 Day Supply (Generic) 3 x Per Week/30 Days Discharge Instructions: Use supplies as instructed; Kit contains: (15) Saline Bullets; (15) 3x3 Gauze; 15 pr Gloves Cleanser: Soap and Water 3 x Per Week/30 Days Discharge Instructions: Gently cleanse wound with antibacterial soap, rinse and pat dry prior to dressing wounds Prim Dressing: Endoform Natural, Non-fenestrated, 2x2 (in/in) (Dispense As Written) 3 x Per Week/30 Days ary Secondary Dressing: (BORDER) Zetuvit Plus SILICONE BORDER Dressing 4x4 (in/in) (Dispense As Written) 3 x Per Week/30 Days Discharge Instructions: Please do not put silicone bordered  dressings under wraps. Use non-bordered dressing only. 1. I am good recommend that we have the patient continue to monitor for any signs of infection or worsening. Based on what I am seeing I do believe that she [name] making some pretty good progress here and I am very pleased in that regard. 2. I am good recommend as well that the patient should continue to keep pressure off of the foot location and again that something that we have discussed previous. She [name] trying to do as much as she can and again I am very pleased in that regard. We will see patient back for reevaluation in 1 week here in the clinic. If anything worsens or changes patient will contact our office for additional recommendations. Electronic Signature(s) Signed: 05/03/2022 6:26:21 PM By: Lacey Derry PA-C Entered By: [name] on 05/03/2022 18:26:21 -------------------------------------------------------------------------------- SuperBill Details Patient Name: Date of Service: Lacey V [name], [name] 05/03/2022 Medical Record Number: [id number] Patient Account Number: [id number] Date of Birth/Sex: Treating RN: [birth year] (88 y.o. [name] Primary Care Provider: Marylynn [name] Other Clinician: Referring Provider: Treating Provider/Extender: [name] Weeks in Treatment: 10 Diagnosis Coding ICD-10 Codes Code Description [phone] Pressure ulcer of other site, stage 3 I50.42 Chronic combined systolic (congestive) and diastolic (congestive) heart failure F01.511 Vascular dementia, unspecified severity, with agitation J44.9 Chronic obstructive pulmonary disease, unspecified Facility Procedures : CPT4 Code: [id number] Description: 84696 - WOUND CARE VISIT-LEV 2 EST PT Modifier: Quantity: 1 Physician Procedures : CPT4 Code Description Modifier [id number] - WC PHYS LEVEL 3 - EST PT ICD-10 Diagnosis Description L89.893 Pressure ulcer of other site, stage 3 I50.42 Chronic combined systolic (congestive) and  diastolic (congestive) heart failure F01.511 Vascular  dementia, unspecified severity, with agitation [name], Lacey L ([id number]) [id number] Page 6 of 6 J44.9 Chronic obstructive pulmonary disease, unspecified Quantity: 1 Electronic Signature(s) Signed: 05/03/2022 6:26:45 PM By: Lacey Derry PA-C Previous Signature: 05/03/2022 12:34:17 PM Version By: Yevonne Pax RN Entered By: [name] on 05/03/2022 18:26:45

## 2022-05-10 ENCOUNTER — Encounter: Payer: Medicare Other | Attending: Physician Assistant | Admitting: Physician Assistant

## 2022-05-10 DIAGNOSIS — L89893 Pressure ulcer of other site, stage 3: Secondary | ICD-10-CM | POA: Diagnosis present

## 2022-05-10 DIAGNOSIS — F0150 Vascular dementia without behavioral disturbance: Secondary | ICD-10-CM | POA: Diagnosis not present

## 2022-05-10 DIAGNOSIS — I5042 Chronic combined systolic (congestive) and diastolic (congestive) heart failure: Secondary | ICD-10-CM | POA: Diagnosis not present

## 2022-05-10 DIAGNOSIS — J449 Chronic obstructive pulmonary disease, unspecified: Secondary | ICD-10-CM | POA: Insufficient documentation

## 2022-05-10 NOTE — Progress Notes (Addendum)
MARIETA, DIMAMBRO ([id number]) [id number] Page 1 of 7 Visit Report for 05/10/2022 Arrival Information Details Patient Name: Date of Service: [name], [name]. 05/10/2022 10:00 A M Medical Record Number: [id number] Patient Account Number: [id number] Date of Birth/Sex: Treating RN: 1934/04/02 (88 y.o. [name] Primary Care [name]: [name] Other Clinician: Betha [name] Referring [name]: Treating [name]/Extender: [name] Weeks in Treatment: 11 Visit Information History Since Last Visit All ordered tests and consults were completed: No Patient Arrived: Wheel Chair Added or deleted any medications: No Arrival Time: 10:22 Any new allergies or adverse reactions: No Transfer Assistance: EasyPivot Patient Lift Had a fall or experienced change in No Patient Identification Verified: Yes activities of daily living that may affect Secondary Verification Process Completed: Yes risk of falls: Patient Requires Transmission-Based Precautions: No Signs or symptoms of abuse/neglect since last visito No Patient Has Alerts: No Hospitalized since last visit: No Implantable device outside of the clinic excluding No cellular tissue based products placed in the center since last visit: Has Dressing in Place as Prescribed: Yes Pain Present Now: No Electronic Signature(s) Signed: 05/11/2022 8:17:18 AM By: [name] Entered By: [name] on 05/10/2022 10:23:04 -------------------------------------------------------------------------------- Clinic Level of Care Assessment Details Patient Name: Date of Service: [name], [name]. 05/10/2022 10:00 A M Medical Record Number: [id number] Patient Account Number: [id number] Date of Birth/Sex: Treating RN: Mar 18, 1934 (88 y.o. [name] Primary Care [name]: [name] Other Clinician: Betha [name] Referring [name]: Treating [name]/Extender: [name] Weeks in Treatment: 11 Clinic Level of Care Assessment Items TOOL 1 Quantity Score []  - 0 Use when EandM and Procedure [name] performed on INITIAL visit ASSESSMENTS - Nursing Assessment / Reassessment []  - 0 General Physical Exam (combine w/ comprehensive assessment (listed just below) when performed on new pt. evals) []  - 0 Comprehensive Assessment (HX, ROS, Risk Assessments, Wounds Hx, etc.) ASSESSMENTS - Wound and Skin Assessment / Reassessment []  - 0 Dermatologic / Skin Assessment (not related to wound area) ASSESSMENTS - Ostomy and/or Continence Assessment and Care []  - 0 Incontinence Assessment and Management []  - 0 Ostomy Care Assessment and Management (repouching, etc.) PROCESS - Coordination of Care []  - 0 Simple Patient / Family Education for ongoing care []  - 0 Complex (extensive) Patient / Family Education for ongoing care []  - 0 Staff obtains Chiropractor, Records, T Results / Process Orders est []  - 0 Staff telephones HHA, Nursing Homes / Clarify orders / etc []  - 0 Routine Transfer to another Facility (non-emergent condition) []  - 0 Routine Hospital Admission (non-emergent condition) [name], [name] ([id number]) [id number] Page 2 of 7 []  - 0 New Admissions / Manufacturing engineer / Ordering NPWT Apligraf, etc. , []  - 0 Emergency Hospital Admission (emergent condition) PROCESS - Special Needs []  - 0 Pediatric / Minor Patient Management []  - 0 Isolation Patient Management []  - 0 Hearing / Language / Visual special needs []  - 0 Assessment of Community assistance (transportation, D/C planning, etc.) []  - 0 Additional assistance / Altered mentation []  - 0 Support Surface(s) Assessment (bed, cushion, seat, etc.) INTERVENTIONS - Miscellaneous []  - 0 External ear exam []  - 0 Patient Transfer (multiple staff / Nurse, adult / Similar devices) []  - 0 Simple Staple / Suture removal (25 or less) []  - 0 Complex Staple / Suture  removal (26 or more) []  - 0 Hypo/Hyperglycemic Management (do not check if billed separately) []  - 0 Ankle / Brachial Index (ABI) - do not check  if billed separately Has the patient been seen at the hospital within the last three years: Yes Total Score: 0 Level Of Care: ____ Electronic Signature(s) Signed: 05/11/2022 8:17:18 AM By: [name] Entered By: [name] on 05/10/2022 10:40:10 -------------------------------------------------------------------------------- Encounter Discharge Information Details Patient Name: Date of Service: [name], [name]. 05/10/2022 10:00 A M Medical Record Number: [id number] Patient Account Number: [id number] Date of Birth/Sex: Treating RN: 03/08/1934 (88 y.o. [name] Primary Care [name]: [name] Other Clinician: Betha [name] Referring [name]: Treating [name]/Extender: [name] Weeks in Treatment: 11 Encounter Discharge Information Items Post Procedure Vitals Discharge Condition: Stable Temperature (F): 97.4 Ambulatory Status: Wheelchair Pulse (bpm): 84 Discharge Destination: Home Respiratory Rate (breaths/min): 16 Transportation: Private Auto Blood Pressure (mmHg): 106/71 Accompanied By: daughter Schedule Follow-up Appointment: Yes Clinical Summary of Care: Electronic Signature(s) Signed: 05/11/2022 8:17:18 AM By: [name] Entered By: [name] on 05/10/2022 11:54:21 -------------------------------------------------------------------------------- Lower Extremity Assessment Details Patient Name: Date of Service: [name], [name]. 05/10/2022 10:00 A M Medical Record Number: [id number] Patient Account Number: [id number] Date of Birth/Sex: Treating RN: 1934/12/22 (88 y.o. [name] Primary Care Brandis Wixted: [name] Other Clinician: Betha [name] Referring [name]: Treating [name]/Extender: [name] Weeks in Treatment: [address]) [name], [name] ([id number]) [id number] Page 3 of 7 Signed: 05/11/2022 8:17:18 AM By: [name] Signed: 05/12/2022 9:53:20 AM By: Yevonne Pax RN Entered By: [name] on 05/10/2022 10:32:03 -------------------------------------------------------------------------------- Multi Wound Chart Details Patient Name: Date of Service: [name], [name]. 05/10/2022 10:00 A M Medical Record Number: [id number] Patient Account Number: [id number] Date of Birth/Sex: Treating RN: August 31, 1934 (88 y.o. [name] Primary Care Sophronia Varney: [name] Other Clinician: Betha [name] Referring Neeraj Housand: Treating Lenah Messenger/Extender: [name] Weeks in Treatment: 11 Vital Signs Height(in): 62 Pulse(bpm): 84 Weight(lbs): 105 Blood Pressure(mmHg): 106/71 Body Mass Index(BMI): 19.2 Temperature(F): 97.4 Respiratory Rate(breaths/min): 16 [1:Photos:] [N/A:N/A] Left, Distal, Medial Foot N/A N/A Wound Location: Gradually Appeared N/A N/A Wounding Event: Pressure Ulcer N/A N/A Primary Etiology: Chronic Obstructive Pulmonary N/A N/A Comorbid History: Disease (COPD), Congestive Heart Failure, Dementia 01/22/2022 N/A N/A Date Acquired: 11 N/A N/A Weeks of Treatment: Open N/A N/A Wound Status: No N/A N/A Wound Recurrence: 0.3x0.2x0.1 N/A N/A Measurements [name] x W x D (cm) 0.047 N/A N/A A (cm) : rea 0.005 N/A N/A Volume (cm) : 33.80% N/A N/A % Reduction in A rea: 64.30% N/A N/A % Reduction in Volume: Category/Stage III N/A N/A Classification: Medium N/A N/A Exudate A mount: Serosanguineous N/A N/A Exudate Type: red, brown N/A N/A Exudate Color: Small (1-33%) N/A N/A Granulation A mount: Pink, Pale N/A N/A Granulation Quality: Large (67-100%) N/A N/A Necrotic A mount: Fat Layer (Subcutaneous Tissue): Yes N/A N/A Exposed Structures: Fascia: No Tendon: No Muscle: No Joint: No Bone: No Medium (34-66%) N/A  N/A Epithelialization: Treatment Notes Electronic Signature(s) Signed: 05/11/2022 8:17:18 AM By: [name] Entered By: [name] on 05/10/2022 10:32:33 -------------------------------------------------------------------------------- Multi-Disciplinary Care Plan Details Patient Name: Date of Service: [name], [name]. 05/10/2022 10:00 A Burnell Blanks, [name] ([id number]) [id number] Page 4 of 7 Medical Record Number: [id number] Patient Account Number: [id number] Date of Birth/Sex: Treating RN: [birth year] (88 y.o. [name] Primary Care Emmaclaire Switala: [name] Other Clinician: Betha [name] Referring Arnaldo Heffron: Treating Nori Poland/Extender: [name] Weeks in Treatment: 11 Active Inactive Wound/Skin Impairment Nursing Diagnoses: Knowledge deficit related to ulceration/compromised skin integrity Goals: Patient/caregiver will verbalize understanding of skin care regimen  Date Initiated: 02/22/2022 Date Inactivated: 04/19/2022 Target Resolution Date: 04/23/2022 Goal Status: Met Ulcer/skin breakdown will have a volume reduction of 30% by week 4 Date Initiated: 02/22/2022 Date Inactivated: 04/05/2022 Target Resolution Date: 03/23/2022 Goal Status: Unmet Unmet Reason: comorbities Ulcer/skin breakdown will have a volume reduction of 50% by week 8 Date Initiated: 02/22/2022 Date Inactivated: 04/19/2022 Target Resolution Date: 04/19/2022 Unmet Reason: underlying conditions, Goal Status: Unmet wound improving Ulcer/skin breakdown will have a volume reduction of 80% by week 12 Date Initiated: 02/22/2022 Target Resolution Date: 05/23/2022 Goal Status: Active Ulcer/skin breakdown will heal within 14 weeks Date Initiated: 02/22/2022 Target Resolution Date: 06/23/2022 Goal Status: Active Interventions: Assess patient/caregiver ability to obtain necessary supplies Assess patient/caregiver ability to perform ulcer/skin care regimen upon admission  and as needed Assess ulceration(s) every visit Notes: Electronic Signature(s) Signed: 05/11/2022 8:17:18 AM By: [name] Signed: 05/12/2022 9:53:20 AM By: Yevonne Pax RN Entered By: [name] on 05/10/2022 10:40:20 -------------------------------------------------------------------------------- Pain Assessment Details Patient Name: Date of Service: [name], [name]. 05/10/2022 10:00 A M Medical Record Number: [id number] Patient Account Number: [id number] Date of Birth/Sex: Treating RN: 07-30-1934 (88 y.o. [name] Primary Care Emmaclaire Switala: [name] Other Clinician: Betha [name] Referring Tiburcio Linder: Treating Giuliano Preece/Extender: [name] Weeks in Treatment: 11 Active Problems Location of Pain Severity and Description of Pain Patient Has Paino No Site Locations [place], [place] [name] ([id number]) [id number] Page 5 of 7 Pain Management and Medication Current Pain Management: Electronic Signature(s) Signed: 05/11/2022 8:17:18 AM By: [name] Signed: 05/12/2022 9:53:20 AM By: Yevonne Pax RN Entered By: [name] on 05/10/2022 10:25:29 -------------------------------------------------------------------------------- Patient/Caregiver Education Details Patient Name: Date of Service: [name], [name]. 5/6/2024andnbsp10:00 A M Medical Record Number: [id number] Patient Account Number: [id number] Date of Birth/Gender: Treating RN: March 07, 1934 (88 y.o. [name] Primary Care Physician: [name] Other Clinician: Betha [name] Referring Physician: Treating Physician/Extender: [name] Weeks in Treatment: 11 Education Assessment Education Provided To: Caregiver daughter Education Topics Provided Wound/Skin Impairment: Handouts: Other: continue wound care as directed Methods: Explain/Verbal Responses: State content correctly Electronic Signature(s) Signed: 05/11/2022 8:17:18 AM By: [name] Entered By: [name] on 05/10/2022 11:53:24 -------------------------------------------------------------------------------- Wound Assessment Details Patient Name: Date of Service: [name], [name]. 05/10/2022 10:00 A M Medical Record Number: [id number] Patient Account Number: [id number] Date of Birth/Sex: Treating RN: 1934-08-09 (88 y.o. [name] Primary Care Domitila Stetler: [name] Other Clinician: Betha [name] Referring Laci Frenkel: Treating Taiyo Kozma/Extender: [name] Weeks in Treatment: 11 Wound Status Wound Number: 1 Primary Pressure Ulcer Etiology: Wound Location: Left, Distal, Medial Foot Wound Open Wounding Event: Gradually Appeared Status: Date Acquired: 01/22/2022 [name], [name] ([id number]) [phone] Page 6 of 7 Date Acquired: 01/22/2022 Comorbid Chronic Obstructive Pulmonary Disease (COPD), Congestive Weeks Of Treatment: 11 History: Heart Failure, Dementia Clustered Wound: No Photos Wound Measurements Length: (cm) 0.3 Width: (cm) 0.2 Depth: (cm) 0.1 Area: (cm) 0.047 Volume: (cm) 0.005 % Reduction in Area: 33.8% % Reduction in Volume: 64.3% Epithelialization: Medium (34-66%) Wound Description Classification: Category/Stage III Exudate Amount: Medium Exudate Type: Serosanguineous Exudate Color: red, brown Foul Odor After Cleansing: No Slough/Fibrino Yes Wound Bed Granulation Amount: Small (1-33%) Exposed Structure Granulation Quality: Pink, Pale Fascia Exposed: No Necrotic Amount: Large (67-100%) Fat Layer (Subcutaneous Tissue) Exposed: Yes Necrotic Quality: Adherent Slough Tendon Exposed: No Muscle Exposed: No Joint Exposed: No Bone Exposed: No Treatment Notes Wound #1 (Foot) Wound Laterality: Left, Medial, Distal Cleanser [place] Ancillary Kit - 15 Day Supply  Discharge Instruction: Use supplies as instructed; Kit contains: (15) Saline Bullets; (15) 3x3 Gauze; 15 pr Gloves Soap and  Water Discharge Instruction: Gently cleanse wound with antibacterial soap, rinse and pat dry prior to dressing wounds Peri-Wound Care Topical Primary Dressing Endoform Natural, Non-fenestrated, 2x2 (in/in) Secondary Dressing (BORDER) Zetuvit Plus SILICONE BORDER Dressing 4x4 (in/in) Discharge Instruction: Please do not put silicone bordered dressings under wraps. Use non-bordered dressing only. Secured With Compression Wrap Compression Stockings Add-Ons Electronic Signature(s) Signed: 05/11/2022 8:17:18 AM By: [name] Signed: 05/12/2022 9:53:20 AM By: Yevonne Pax RN Entered By: [name] on 05/10/2022 10:31:46 [name], [name] ([id number]) [id number] Page 7 of 7 -------------------------------------------------------------------------------- Vitals Details Patient Name: Date of Service: [name], [name]. 05/10/2022 10:00 A M Medical Record Number: [id number] Patient Account Number: [id number] Date of Birth/Sex: Treating RN: Jan 23, 1934 (88 y.o. [name] Primary Care Amalea Ottey: [name] Other Clinician: Betha [name] Referring Musette Kisamore: Treating Geraldine Tesar/Extender: [name] Weeks in Treatment: 11 Vital Signs Time Taken: 10:24 Temperature (F): 97.4 Height (in): 62 Pulse (bpm): 84 Weight (lbs): 105 Respiratory Rate (breaths/min): 16 Body Mass Index (BMI): 19.2 Blood Pressure (mmHg): 106/71 Reference Range: 80 - 120 mg / dl Electronic Signature(s) Signed: 05/11/2022 8:17:18 AM By: [name] Entered By: [name] on 05/10/2022 10:25:23

## 2022-05-10 NOTE — Progress Notes (Addendum)
CANDA, SLAPPEY ([id number]) [id number] Page 1 of 6 Visit Report for 05/10/2022 Chief Complaint Document Details Patient Name: Date of Service: [name], [name] 05/10/2022 10:00 A M Medical Record Number: [id number] Patient Account Number: [id number] Date of Birth/Sex: Treating RN: February 18, 1934 (88 y.o. [name] Primary Care Provider: Marylynn Pearson Other Clinician: Betha Loa Referring Provider: Treating Provider/Extender: Lorna Dibble Weeks in Treatment: 11 Information Obtained from: Patient Chief Complaint Left foot pressure ulcer Electronic Signature(s) Signed: 05/10/2022 9:58:23 AM By: Allen Derry PA-C Entered By: Allen Derry on 05/10/2022 09:58:22 -------------------------------------------------------------------------------- Debridement Details Patient Name: Date of Service: [name], [name] 05/10/2022 10:00 A M Medical Record Number: [id number] Patient Account Number: [id number] Date of Birth/Sex: Treating RN: 1934/10/13 (88 y.o. [name] Primary Care Provider: Marylynn Pearson Other Clinician: Betha Loa Referring Provider: Treating Provider/Extender: Lorna Dibble Weeks in Treatment: 11 Debridement Performed for Assessment: Wound #1 Left,Distal,Medial Foot Performed By: Physician Allen Derry, PA-C Debridement Type: Debridement Level of Consciousness (Pre-procedure): Awake and Alert Pre-procedure Verification/Time Out Yes - 10:38 Taken: Start Time: 10:38 Percent of Wound Bed Debrided: 100% T Area Debrided (cm): otal 0.05 Tissue and other material debrided: Viable, Non-Viable, Callus, Slough, Subcutaneous, Slough Level: Skin/Subcutaneous Tissue Debridement Description: Excisional Instrument: Curette Bleeding: Minimum Hemostasis Achieved: Pressure Response to Treatment: Procedure was tolerated well Level of Consciousness (Post- Awake and Alert procedure): Post Debridement Measurements of  Total Wound Length: (cm) 0.3 Stage: Category/Stage III Width: (cm) 0.2 Depth: (cm) 0.1 Volume: (cm) 0.005 Character of Wound/Ulcer Post Debridement: Improved Post Procedure Diagnosis Same as Pre-procedure Electronic Signature(s) Signed: 05/10/2022 10:42:59 AM By: Allen Derry PA-C Signed: 05/12/2022 9:53:20 AM By: Yevonne Pax RN Entered By: Allen Derry on 05/10/2022 10:42:59 Jefcoat, [place] L ([id number]) [id number] Page 2 of 6 -------------------------------------------------------------------------------- HPI Details Patient Name: Date of Service: [name], [name] 05/10/2022 10:00 A M Medical Record Number: [id number] Patient Account Number: [id number] Date of Birth/Sex: Treating RN: 1934-08-08 (88 y.o. [name] Primary Care Provider: Marylynn Pearson Other Clinician: Betha Loa Referring Provider: Treating Provider/Extender: Lorna Dibble Weeks in Treatment: 11 History of Present Illness HPI Description: 02-22-2022 upon evaluation today patient appears to be doing somewhat poorly in regard to her left medial first metatarsal head where she does have a bunion. Unfortunately this [name] causing some pressure and has led to a pressure injury at this site. Fortunately I do not see any signs of infection at this point but that [name] something we definitely can need to keep a close eye on. Fortunately there does not appear to be any signs of active infection locally nor systemically at this point. No fevers, chills, nausea, vomiting, or diarrhea. Patient does have a history of of vascular dementia, COPD, congestive heart failure, and the dementia does affect her ability to be able to in some cases follow directions. She [name] very nervous about things in general. This [name] stated to have started somewhere around January 22, 2022. 03-02-2022 upon evaluation today patient appears to be doing well currently in regard to her wounds all things considered.  Fortunately I do not see any signs of infection unfortunately both wounds are still open and the one on the medial foot first metatarsal location [name] still the worst. Although the area over the fifth toe right foot actually [name] also open at this point unfortunately. With that being said I do think we can need to continue to monitor for any signs of worsening or infection although  right now I think she [name] doing okay in that regard. 3/4; this [name] a patient with a bunion wound on the left medial foot. She also had a small blister on the right lateral fifth toe. She went for x-rays last week no acute fracture or dislocation was seen on the left side she had worsened moderate hallucis valgus and moderate second and third fourth fifth tarsometatarsal osteoarthritis. We use Prisma. 3/11; patient presents for follow-up. She has been using endoform to the left medial foot wound. She has cushion pads that she uses in between the toes to help offload the areas and prevent future wounds. The right lateral fifth met head remains closed. 03-22-2022 upon evaluation today patient appears to be doing well currently in regard to her wound. She does show some signs of improvement here. She [name] also showing some signs of being a little bit too moist I think we may want to cut back on the hydrogel possibly just using the saline only at this point she [name] in agreement with that plan. Fortunately I do not see any evidence of active infection locally nor systemically which [name] great news. No fevers, chills, nausea, vomiting, or diarrhea. 03-29-22 upon evaluation today patient actually appears to be making excellent progress and very pleased with how things appear and how the wound [name] doing. I do think that we are moving in the right direction and very pleased in that regard. No fevers, chills, nausea, vomiting, or diarrhea. 04-05-2022 upon evaluation today patient appears to be doing well currently in regard to her wound. This [name]  actually showing signs of improvement. Fortunately there does not appear to be any signs of active infection locally nor systemically at this time which [name] great news. No fevers, chills, nausea, vomiting, or diarrhea. 04-12-2022 upon evaluation today patient's wound actually showing signs of some improvement. Fortunately there does not appear to be any evidence of active infection locally nor systemically which [name] great news. No fevers, chills, nausea, vomiting, or diarrhea. 04-19-2022 upon evaluation today patient actually appears to be doing quite well in regard to her wound. She has been tolerating the dressing changes without complication. Fortunately there does not appear to be any signs of active infection locally or systemically which [name] great news. No fevers, chills, nausea, vomiting, or diarrhea. 05-03-2022 upon evaluation today patient appears to be doing better in regard to her foot ulcer this [name] actually very close to complete resolution. Fortunately I do not see any signs of active infection locally nor systemically. 05-10-2022 upon evaluation today patient appears to be doing well currently in regard to her wound there [name] some need for sharp debridement but minimal. Electronic Signature(s) Signed: 05/10/2022 10:41:41 AM By: Allen Derry PA-C Entered By: Allen Derry on 05/10/2022 10:41:41 -------------------------------------------------------------------------------- Physical Exam Details Patient Name: Date of Service: [name], [name] 05/10/2022 10:00 A M Medical Record Number: [id number] Patient Account Number: [id number] Date of Birth/Sex: Treating RN: November 30, 1934 (88 y.o. [name] Primary Care Provider: Marylynn Pearson Other Clinician: Betha Loa Referring Provider: Treating Provider/Extender: Lorna Dibble Weeks in Treatment: 11 Constitutional Well-nourished and well-hydrated in no acute distress. Respiratory normal breathing without  difficulty. Psychiatric this patient [name] able to make decisions and demonstrates good insight into disease process. Alert and Oriented x 3. pleasant and cooperative. AGAPE, EDMUNDSON ([id number]) [id number] Page 3 of 6 Notes Upon inspection patient's wound bed actually showed signs of good granulation epithelization at this point. Fortunately there does not appear  to be any signs of active infection locally nor systemically which [name] great news and in general I do believe that we are moving in the right direction here. No fevers, chills, nausea, vomiting, or diarrhea. Electronic Signature(s) Signed: 05/10/2022 10:41:59 AM By: Allen Derry PA-C Entered By: Allen Derry on 05/10/2022 10:41:59 -------------------------------------------------------------------------------- Physician Orders Details Patient Name: Date of Service: [name], [name] 05/10/2022 10:00 A M Medical Record Number: [id number] Patient Account Number: [id number] Date of Birth/Sex: Treating RN: 02-08-1934 (88 y.o. [name] Primary Care Provider: Marylynn Pearson Other Clinician: Betha Loa Referring Provider: Treating Provider/Extender: Lorna Dibble Weeks in Treatment: 45 Verbal / Phone Orders: Yes Clinician: Yevonne Pax Read Back and Verified: Yes Diagnosis Coding ICD-10 Coding Code Description [phone] Pressure ulcer of other site, stage 3 I50.42 Chronic combined systolic (congestive) and diastolic (congestive) heart failure F01.511 Vascular dementia, unspecified severity, with agitation J44.9 Chronic obstructive pulmonary disease, unspecified Follow-up Appointments Return Appointment in 1 week. Bathing/ [employer] wounds with antibacterial soap and water. Anesthetic (Use 'Patient Medications' Section for Anesthetic Order Entry) Lidocaine applied to wound bed Edema Control - Lymphedema / Segmental Compressive Device / Other Elevate, Exercise Daily and A  void Standing for Long Periods of Time. Elevate legs to the level of the heart and pump ankles as often as possible Elevate leg(s) parallel to the floor when sitting. Wound Treatment Wound #1 - Foot Wound Laterality: Left, Medial, Distal Cleanser: Byram Ancillary Kit - 15 Day Supply (DME) (Generic) 3 x Per Week/30 Days Discharge Instructions: Use supplies as instructed; Kit contains: (15) Saline Bullets; (15) 3x3 Gauze; 15 pr Gloves Cleanser: Soap and Water 3 x Per Week/30 Days Discharge Instructions: Gently cleanse wound with antibacterial soap, rinse and pat dry prior to dressing wounds Prim Dressing: Endoform Natural, Non-fenestrated, 2x2 (in/in) (Dispense As Written) 3 x Per Week/30 Days ary Secondary Dressing: (BORDER) Zetuvit Plus SILICONE BORDER Dressing 4x4 (in/in) (DME) (Dispense As Written) 3 x Per Week/30 Days Discharge Instructions: Please do not put silicone bordered dressings under wraps. Use non-bordered dressing only. Electronic Signature(s) Signed: 05/10/2022 6:09:56 PM By: Allen Derry PA-C Signed: 05/11/2022 8:17:18 AM By: Betha Loa Entered By: Betha Loa on 05/10/2022 10:42:27 -------------------------------------------------------------------------------- Problem List Details Patient Name: Date of Service: [name], [name] 05/10/2022 10:00 A M Medical Record Number: [id number] Patient Account Number: [id number] Date of Birth/Sex: Treating RN: 08/23/1934 (88 y.o. [name] Primary Care Provider: Marylynn Pearson Other Clinician: Betha Loa Referring Provider: Treating Provider/Extender: Lorna Dibble [place], [place] ([id number]) [id number] Page 4 of 6 Weeks in Treatment: 11 Active Problems ICD-10 Encounter Code Description Active Date MDM Diagnosis L89.893 Pressure ulcer of other site, stage 3 02/22/2022 No Yes I50.42 Chronic combined systolic (congestive) and diastolic (congestive) heart failure 02/22/2022 No  Yes F01.511 Vascular dementia, unspecified severity, with agitation 02/22/2022 No Yes J44.9 Chronic obstructive pulmonary disease, unspecified 02/22/2022 No Yes Inactive Problems Resolved Problems Electronic Signature(s) Signed: 05/10/2022 9:58:14 AM By: Allen Derry PA-C Entered By: Allen Derry on 05/10/2022 09:58:13 -------------------------------------------------------------------------------- Progress Note Details Patient Name: Date of Service: [name], [name] 05/10/2022 10:00 A M Medical Record Number: [id number] Patient Account Number: [id number] Date of Birth/Sex: Treating RN: 29-May-1934 (88 y.o. [name] Primary Care Provider: Marylynn Pearson Other Clinician: Betha Loa Referring Provider: Treating Provider/Extender: Lorna Dibble Weeks in Treatment: 11 Subjective Chief Complaint Information obtained from Patient Left foot pressure ulcer History of Present Illness (HPI) 02-22-2022 upon evaluation today  patient appears to be doing somewhat poorly in regard to her left medial first metatarsal head where she does have a bunion. Unfortunately this [name] causing some pressure and has led to a pressure injury at this site. Fortunately I do not see any signs of infection at this point but that [name] something we definitely can need to keep a close eye on. Fortunately there does not appear to be any signs of active infection locally nor systemically at this point. No fevers, chills, nausea, vomiting, or diarrhea. Patient does have a history of of vascular dementia, COPD, congestive heart failure, and the dementia does affect her ability to be able to in some cases follow directions. She [name] very nervous about things in general. This [name] stated to have started somewhere around January 22, 2022. 03-02-2022 upon evaluation today patient appears to be doing well currently in regard to her wounds all things considered. Fortunately I do not see any signs of infection  unfortunately both wounds are still open and the one on the medial foot first metatarsal location [name] still the worst. Although the area over the fifth toe right foot actually [name] also open at this point unfortunately. With that being said I do think we can need to continue to monitor for any signs of worsening or infection although right now I think she [name] doing okay in that regard. 3/4; this [name] a patient with a bunion wound on the left medial foot. She also had a small blister on the right lateral fifth toe. She went for x-rays last week no acute fracture or dislocation was seen on the left side she had worsened moderate hallucis valgus and moderate second and third fourth fifth tarsometatarsal osteoarthritis. We use Prisma. 3/11; patient presents for follow-up. She has been using endoform to the left medial foot wound. She has cushion pads that she uses in between the toes to help offload the areas and prevent future wounds. The right lateral fifth met head remains closed. 03-22-2022 upon evaluation today patient appears to be doing well currently in regard to her wound. She does show some signs of improvement here. She [name] also showing some signs of being a little bit too moist I think we may want to cut back on the hydrogel possibly just using the saline only at this point she [name] in agreement with that plan. Fortunately I do not see any evidence of active infection locally nor systemically which [name] great news. No fevers, chills, nausea, vomiting, or diarrhea. 03-29-22 upon evaluation today patient actually appears to be making excellent progress and very pleased with how things appear and how the wound [name] doing. I do think that we are moving in the right direction and very pleased in that regard. No fevers, chills, nausea, vomiting, or diarrhea. AASHRITA, HYLES ([id number]) [id number] Page 5 of 6 04-05-2022 upon evaluation today patient appears to be doing well currently in  regard to her wound. This [name] actually showing signs of improvement. Fortunately there does not appear to be any signs of active infection locally nor systemically at this time which [name] great news. No fevers, chills, nausea, vomiting, or diarrhea. 04-12-2022 upon evaluation today patient's wound actually showing signs of some improvement. Fortunately there does not appear to be any evidence of active infection locally nor systemically which [name] great news. No fevers, chills, nausea, vomiting, or diarrhea. 04-19-2022 upon evaluation today patient actually appears to be doing quite well in regard to her wound. She has been tolerating the  dressing changes without complication. Fortunately there does not appear to be any signs of active infection locally or systemically which [name] great news. No fevers, chills, nausea, vomiting, or diarrhea. 05-03-2022 upon evaluation today patient appears to be doing better in regard to her foot ulcer this [name] actually very close to complete resolution. Fortunately I do not see any signs of active infection locally nor systemically. 05-10-2022 upon evaluation today patient appears to be doing well currently in regard to her wound there [name] some need for sharp debridement but minimal. Objective Constitutional Well-nourished and well-hydrated in no acute distress. Vitals Time Taken: 10:24 AM, Height: 62 in, Weight: 105 lbs, BMI: 19.2, Temperature: 97.4 F, Pulse: 84 bpm, Respiratory Rate: 16 breaths/min, Blood Pressure: 106/71 mmHg. Respiratory normal breathing without difficulty. Psychiatric this patient [name] able to make decisions and demonstrates good insight into disease process. Alert and Oriented x 3. pleasant and cooperative. General Notes: Upon inspection patient's wound bed actually showed signs of good granulation epithelization at this point. Fortunately there does not appear to be any signs of active infection locally nor systemically which [name] great news and in  general I do believe that we are moving in the right direction here. No fevers, chills, nausea, vomiting, or diarrhea. Integumentary (Hair, Skin) Wound #1 status [name] Open. Original cause of wound was Gradually Appeared. The date acquired was: 01/22/2022. The wound has been in treatment 11 weeks. The wound [name] located on the Left,Distal,Medial Foot. The wound measures 0.3cm length x 0.2cm width x 0.1cm depth; 0.047cm^2 area and 0.005cm^3 volume. There [name] Fat Layer (Subcutaneous Tissue) exposed. There [name] a medium amount of serosanguineous drainage noted. There [name] small (1-33%) pink, pale granulation within the wound bed. There [name] a large (67-100%) amount of necrotic tissue within the wound bed including Adherent Slough. Assessment Active Problems ICD-10 Pressure ulcer of other site, stage 3 Chronic combined systolic (congestive) and diastolic (congestive) heart failure Vascular dementia, unspecified severity, with agitation Chronic obstructive pulmonary disease, unspecified Procedures Wound #1 Pre-procedure diagnosis of Wound #1 [name] a Pressure Ulcer located on the Left,Distal,Medial Foot . There was a Excisional Skin/Subcutaneous Tissue Debridement with a total area of 0.05 sq cm performed by Allen Derry, PA-C. With the following instrument(s): Curette to remove Viable and Non-Viable tissue/material. Material removed includes Callus, Subcutaneous Tissue, and Slough. A time out was conducted at 10:38, prior to the start of the procedure. A Minimum amount of bleeding was controlled with Pressure. The procedure was tolerated well. Post Debridement Measurements: 0.3cm length x 0.2cm width x 0.1cm depth; 0.005cm^3 volume. Post debridement Stage noted as Category/Stage III. Character of Wound/Ulcer Post Debridement [name] improved. Post procedure Diagnosis Wound #1: Same as Pre-Procedure Plan Follow-up Appointments: Return Appointment in 1 week. MIKHAIL, GEBEL ([id number])  [id number] Page 6 of 6 Bathing/ Shower/ Hygiene: Wash wounds with antibacterial soap and water. Anesthetic (Use 'Patient Medications' Section for Anesthetic Order Entry): Lidocaine applied to wound bed Edema Control - Lymphedema / Segmental Compressive Device / Other: Elevate, Exercise Daily and Avoid Standing for Long Periods of Time. Elevate legs to the level of the heart and pump ankles as often as possible Elevate leg(s) parallel to the floor when sitting. WOUND #1: - Foot Wound Laterality: Left, Medial, Distal Cleanser: [employer]ram Ancillary Kit - 15 Day Supply (DME) (Generic) 3 x Per Week/30 Days Discharge Instructions: Use supplies as instructed; Kit contains: (15) Saline Bullets; (15) 3x3 Gauze; 15 pr Gloves Cleanser: Soap and Water 3 x Per Week/30 Days Discharge Instructions: Gently  cleanse wound with antibacterial soap, rinse and pat dry prior to dressing wounds Prim Dressing: Endoform Natural, Non-fenestrated, 2x2 (in/in) (Dispense As Written) 3 x Per Week/30 Days ary Secondary Dressing: (BORDER) Zetuvit Plus SILICONE BORDER Dressing 4x4 (in/in) (DME) (Dispense As Written) 3 x Per Week/30 Days Discharge Instructions: Please do not put silicone bordered dressings under wraps. Use non-bordered dressing only. 1. I would suggest that we have the patient continue to monitor for any signs of infection or worsening. Overall if things continue to do as well as they are we may get this healed shortly. 2. I am also can recommend she continue with endoform specifically. Threes and a bordered foam to cover. We will see patient back for reevaluation in 1 week here in the clinic. If anything worsens or changes patient will contact our office for additional recommendations. Electronic Signature(s) Signed: 05/14/2022 1:22:22 PM By: Allen Derry PA-C Previous Signature: 05/10/2022 10:42:19 AM Version By: Allen Derry PA-C Entered By: Allen Derry on 05/14/2022  13:22:22 -------------------------------------------------------------------------------- SuperBill Details Patient Name: Date of Service: [name], [name] 05/10/2022 Medical Record Number: [id number] Patient Account Number: [id number] Date of Birth/Sex: Treating RN: Oct 09, 1934 (88 y.o. [name] Primary Care Provider: Marylynn Pearson Other Clinician: Betha Loa Referring Provider: Treating Provider/Extender: Lorna Dibble Weeks in Treatment: 11 Diagnosis Coding ICD-10 Codes Code Description [phone] Pressure ulcer of other site, stage 3 I50.42 Chronic combined systolic (congestive) and diastolic (congestive) heart failure F01.511 Vascular dementia, unspecified severity, with agitation J44.9 Chronic obstructive pulmonary disease, unspecified Facility Procedures : CPT4 Code: [id number] Description: 11042 - DEB SUBQ TISSUE 20 SQ CM/< ICD-10 Diagnosis Description L89.893 Pressure ulcer of other site, stage 3 Modifier: Quantity: 1 Physician Procedures : CPT4 Code Description Modifier [id number] 11042 - WC PHYS SUBQ TISS 20 SQ CM ICD-10 Diagnosis Description L89.893 Pressure ulcer of other site, stage 3 Quantity: 1 Electronic Signature(s) Signed: 05/10/2022 10:53:08 AM By: Allen Derry PA-C Previous Signature: 05/10/2022 10:44:58 AM Version By: Allen Derry PA-C Entered By: Allen Derry on 05/10/2022 10:53:08

## 2022-05-17 ENCOUNTER — Encounter: Payer: Medicare Other | Admitting: Physician Assistant

## 2022-05-17 DIAGNOSIS — L89893 Pressure ulcer of other site, stage 3: Secondary | ICD-10-CM | POA: Diagnosis not present

## 2022-05-17 NOTE — Progress Notes (Signed)
JAYNA, GILFILLAN ([id number]) [id number] Page 1 of 1 Visit Report for 05/17/2022 Chief Complaint Document Details Patient Name: Date of Service: [name], [name] 05/17/2022 10:45 A M Medical Record Number: [id number] Patient Account Number: [id number] Date of Birth/Sex: Treating RN: 04-25-1934 (88 y.o. Skip Mayer Primary Care Provider: Marylynn Pearson Other Clinician: Betha Loa Referring Provider: Treating Provider/Extender: Lorna Dibble Weeks in Treatment: 12 Information Obtained from: Patient Chief Complaint Left foot pressure ulcer Electronic Signature(s) Signed: 05/17/2022 11:05:59 AM By: Allen Derry PA-C Entered By: Allen Derry on 05/17/2022 11:05:58 -------------------------------------------------------------------------------- Problem List Details Patient Name: Date of Service: [name], [name] 05/17/2022 10:45 A M Medical Record Number: [id number] Patient Account Number: [id number] Date of Birth/Sex: Treating RN: Mar 07, 1934 (88 y.o. Skip Mayer Primary Care Provider: Marylynn Pearson Other Clinician: Betha Loa Referring Provider: Treating Provider/Extender: Lorna Dibble Weeks in Treatment: 12 Active Problems ICD-10 Encounter Code Description Active Date MDM Diagnosis L89.893 Pressure ulcer of other site, stage 3 02/22/2022 No Yes I50.42 Chronic combined systolic (congestive) and diastolic 02/22/2022 No Yes (congestive) heart failure F01.511 Vascular dementia, unspecified severity, with agitation 02/22/2022 No Yes J44.9 Chronic obstructive pulmonary disease, unspecified 02/22/2022 No Yes Inactive Problems Resolved Problems Electronic Signature(s) Signed: 05/17/2022 11:05:50 AM By: Allen Derry PA-C Entered By: Allen Derry on 05/17/2022 11:05:50

## 2022-05-24 ENCOUNTER — Encounter: Payer: Medicare Other | Admitting: Physician Assistant

## 2022-05-24 DIAGNOSIS — L89893 Pressure ulcer of other site, stage 3: Secondary | ICD-10-CM | POA: Diagnosis not present

## 2022-05-24 NOTE — Progress Notes (Signed)
JONNIE, DIPPLE ([id number]) [id number] Page 1 of 1 Visit Report for 05/24/2022 Chief Complaint Document Details Patient Name: Date of Service: [name] [name], Magdelyn L. 05/24/2022 10:45 A M Medical Record Number: [id number] Patient Account Number: [id number] Date of Birth/Sex: Treating RN: 07-Oct-1934 (88 y.o. [name] Primary Care Provider: Marylynn Pearson Other Clinician: Betha Loa Referring Provider: Treating Provider/Extender: Lorna Dibble Weeks in Treatment: 13 Information Obtained from: Patient Chief Complaint Left foot pressure ulcer Electronic Signature(s) Signed: 05/24/2022 11:11:47 AM By: Allen Derry PA-C Entered By: Allen Derry on 05/24/2022 11:11:47 -------------------------------------------------------------------------------- Problem List Details Patient Name: Date of Service: [name], [name] 05/24/2022 10:45 A M Medical Record Number: [id number] Patient Account Number: [id number] Date of Birth/Sex: Treating RN: 22-Mar-1934 (88 y.o. [name] Primary Care Provider: Marylynn Pearson Other Clinician: Betha Loa Referring Provider: Treating Provider/Extender: Lorna Dibble Weeks in Treatment: 13 Active Problems ICD-10 Encounter Code Description Active Date MDM Diagnosis L89.893 Pressure ulcer of other site, stage 3 02/22/2022 No Yes I50.42 Chronic combined systolic (congestive) and diastolic 02/22/2022 No Yes (congestive) heart failure F01.511 Vascular dementia, unspecified severity, with agitation 02/22/2022 No Yes J44.9 Chronic obstructive pulmonary disease, unspecified 02/22/2022 No Yes Inactive Problems Resolved Problems Electronic Signature(s) Signed: 05/24/2022 11:11:44 AM By: Allen Derry PA-C Entered By: Allen Derry on 05/24/2022 11:11:44

## 2022-06-01 ENCOUNTER — Encounter: Payer: Medicare Other | Admitting: Physician Assistant

## 2022-06-01 DIAGNOSIS — L89893 Pressure ulcer of other site, stage 3: Secondary | ICD-10-CM | POA: Diagnosis not present

## 2022-06-07 ENCOUNTER — Encounter: Payer: Medicare Other | Attending: Physician Assistant | Admitting: Physician Assistant

## 2022-06-07 DIAGNOSIS — L89893 Pressure ulcer of other site, stage 3: Secondary | ICD-10-CM | POA: Diagnosis present

## 2022-06-07 DIAGNOSIS — J449 Chronic obstructive pulmonary disease, unspecified: Secondary | ICD-10-CM | POA: Diagnosis not present

## 2022-06-07 DIAGNOSIS — F0150 Vascular dementia without behavioral disturbance: Secondary | ICD-10-CM | POA: Diagnosis not present

## 2022-06-07 DIAGNOSIS — I5042 Chronic combined systolic (congestive) and diastolic (congestive) heart failure: Secondary | ICD-10-CM | POA: Insufficient documentation

## 2022-06-15 ENCOUNTER — Encounter: Payer: Medicare Other | Admitting: Physician Assistant

## 2022-06-15 DIAGNOSIS — L89893 Pressure ulcer of other site, stage 3: Secondary | ICD-10-CM | POA: Diagnosis not present

## 2022-06-15 NOTE — Progress Notes (Addendum)
LORRAYNE, MIYATA ([id number]) [id number] Page 1 of 8 Visit Report for 06/15/2022 Chief Complaint Document Details Patient Name: Date of Service: [name], [name]. 06/15/2022 10:00 A M Medical Record Number: [id number] Patient Account Number: [id number] Date of Birth/Sex: Treating RN: [birth year] (88 y.o. [name] Primary Care Provider: Marylynn [name] Other Clinician: Betha [name] Referring Provider: Treating Provider/Extender: [name] in Treatment: 16 Information Obtained from: Patient Chief Complaint Left foot pressure ulcer and right 4th toe ulcer Electronic Signature(s) Signed: 06/18/2022 1:50:11 PM By: Lacey Derry PA-C Previous Signature: 06/15/2022 10:10:06 AM Version By: Lacey Derry PA-C Entered By: [name] on 06/18/2022 13:50:10 -------------------------------------------------------------------------------- HPI Details Patient Name: Date of Service: [name], [name]. 06/15/2022 10:00 A M Medical Record Number: [id number] Patient Account Number: [id number] Date of Birth/Sex: Treating RN: 05/08/1934 (88 y.o. [name] Primary Care Provider: Marylynn [name] Other Clinician: Betha [name] Referring Provider: Treating Provider/Extender: [name] in Treatment: 16 History of Present Illness HPI Description: 02-22-2022 upon evaluation today patient appears to be doing somewhat poorly in regard to her left medial first metatarsal head where she does have a bunion. Unfortunately this [name] causing some pressure and has led to a pressure injury at this site. Fortunately I do not see any signs of infection at this point but that [name] something we definitely can need to keep a close eye on. Fortunately there does not appear to be any signs of active infection locally nor systemically at this point. No fevers, chills, nausea, vomiting, or diarrhea. Patient does have a history of of vascular dementia,  COPD, congestive heart failure, and the dementia does affect her ability to be able to in some cases follow directions. She [name] very nervous about things in general. This [name] stated to have started somewhere around January 22, 2022. 03-02-2022 upon evaluation today patient appears to be doing well currently in regard to her wounds all things considered. Fortunately I do not see any signs of infection unfortunately both wounds are still open and the one on the medial foot first metatarsal location [name] still the worst. Although the area over the fifth toe right foot actually [name] also open at this point unfortunately. With that being said I do think we can need to continue to monitor for any signs of worsening or infection although right now I think she [name] doing okay in that regard. 3/4; this [name] a patient with a bunion wound on the left medial foot. She also had a small blister on the right lateral fifth toe. She went for x-rays last week no acute fracture or dislocation was seen on the left side she had worsened moderate hallucis valgus and moderate second and third fourth fifth tarsometatarsal osteoarthritis. We use Prisma. [name] ([id number]) [id number] Page 2 of 8 3/11; patient presents for follow-up. She has been using endoform to the left medial foot wound. She has cushion pads that she uses in between the toes to help offload the areas and prevent future wounds. The right lateral fifth met head remains closed. 03-22-2022 upon evaluation today patient appears to be doing well currently in regard to her wound. She does show some signs of improvement here. She [name] also showing some signs of being a little bit too moist I think we may want to cut back on the hydrogel possibly just using the saline only at this point she [name] in agreement with that plan. Fortunately I do not see any  evidence of active infection locally nor systemically which [name] great news. No fevers, chills,  nausea, vomiting, or diarrhea. 03-29-22 upon evaluation today patient actually appears to be making excellent progress and very pleased with how things appear and how the wound [name] doing. I do think that we are moving in the right direction and very pleased in that regard. No fevers, chills, nausea, vomiting, or diarrhea. 04-05-2022 upon evaluation today patient appears to be doing well currently in regard to her wound. This [name] actually showing signs of improvement. Fortunately there does not appear to be any signs of active infection locally nor systemically at this time which [name] great news. No fevers, chills, nausea, vomiting, or diarrhea. 04-12-2022 upon evaluation today patient's wound actually showing signs of some improvement. Fortunately there does not appear to be any evidence of active infection locally nor systemically which [name] great news. No fevers, chills, nausea, vomiting, or diarrhea. 04-19-2022 upon evaluation today patient actually appears to be doing quite well in regard to her wound. She has been tolerating the dressing changes without complication. Fortunately there does not appear to be any signs of active infection locally or systemically which [name] great news. No fevers, chills, nausea, vomiting, or diarrhea. 05-03-2022 upon evaluation today patient appears to be doing better in regard to her foot ulcer this [name] actually very close to complete resolution. Fortunately I do not see any signs of active infection locally nor systemically. 05-10-2022 upon evaluation today patient appears to be doing well currently in regard to her wound there [name] some need for sharp debridement but minimal. 05-17-2022 upon evaluation today patient appears to be doing well currently in regard to her foot ulcer. This [name] still continue to make good progress. Fortunately there does not appear to be any signs of active infection locally nor systemically 05-24-2022 upon patient today patient actually appears to be doing  to her wound. This seems to be making some slow I am extremely pleased with where we stand currently. I do not see any signs of infection locally nor systemically which [name] great news. 06-01-2022 upon evaluation today patient's wound actually appears to be doing excellent. I do not see any signs of active infection at this time which [name] great news and overall I think she [name] actually very close to complete resolution. I am extremely pleased with where things stand today. In general I think that were moving appropriately towards closure. 06-07-2022 upon evaluation today patient appears to be doing well currently in regard to her wound. This [name] still a little bit open we were hoping that it might be closed but again this does not appear to be the case. Fortunately I do not see any signs of worsening I think she [name] doing well in general however 06-15-2022 upon evaluation today patient appears to be doing better in regard to the wound on her left foot although she does have a new wound on her right foot at her toe and this [name] a pressure ulcer at this time location. With that being said I do not see any signs of infection here obvious at the moment but again this does appear to be somewhat painful for her and again this [name] new even compared to last week when I last saw her. The left foot seems to be doing much better. Electronic Signature(s) Signed: 06/18/2022 1:50:29 PM By: Lacey Derry PA-C Entered By: [name] on 06/18/2022 13:50:29 -------------------------------------------------------------------------------- Physical Exam Details Patient Name: Date of Service: [name], Lacey  [name]. 06/15/2022 10:00 A M Medical Record Number: [id number] Patient Account Number: [id number] Date of Birth/Sex: Treating RN: 07-24-1934 (88 y.o. [name] Primary Care Provider: Marylynn [name] Other Clinician: Betha [name] Referring Provider: Treating Provider/Extender: [name] in  Treatment: 16 Constitutional Well-nourished and well-hydrated in no acute distress. Respiratory normal breathing without difficulty. Psychiatric this patient [name] able to make decisions and demonstrates good insight into disease process. Alert and Oriented x 3. pleasant and cooperative. Notes LODELL, MAGNIN ([id number]) [id number] Page 3 of 8 Upon inspection patient actually appears to again be doing great in regard to her left foot which I do not see any obvious signs of issue with currently today. The right foot however appears to be doing a bit worse and specifically with regard to the fourth toe there [name] a small opening that though not being it does appear to be deeper and we do need to treat this obviously. Electronic Signature(s) Signed: 06/18/2022 1:51:47 PM By: Lacey Derry PA-C Entered By: [name] on 06/18/2022 13:51:46 -------------------------------------------------------------------------------- Physician Orders Details Patient Name: Date of Service: [name], [name]. 06/15/2022 10:00 A M Medical Record Number: [id number] Patient Account Number: [id number] Date of Birth/Sex: Treating RN: 1934-02-15 (88 y.o. [name] Primary Care Provider: Marylynn [name] Other Clinician: Betha [name] Referring Provider: Treating Provider/Extender: [name] in Treatment: 37 Verbal / Phone Orders: Yes Clinician: Midge Aver Read Back and Verified: Yes Diagnosis Coding ICD-10 Coding Code Description [phone] Pressure ulcer of other site, stage 3 I50.42 Chronic combined systolic (congestive) and diastolic (congestive) heart failure F01.511 Vascular dementia, unspecified severity, with agitation J44.9 Chronic obstructive pulmonary disease, unspecified Follow-up Appointments Return Appointment in 1 week. Bathing/ [employer] wounds with antibacterial soap and water. Anesthetic (Use 'Patient Medications' Section for  Anesthetic Order Entry) Lidocaine applied to wound bed Edema Control - Lymphedema / Segmental Compressive Device / Other Elevate, Exercise Daily and A void Standing for Long Periods of Time. Elevate legs to the level of the heart and pump ankles as often as possible Elevate leg(s) parallel to the floor when sitting. Wound Treatment Wound #1 - Foot Wound Laterality: Left, Medial, Distal Cleanser: Byram Ancillary Kit - 15 Day Supply (DME) (Generic) 3 x Per Week/30 Days Discharge Instructions: Use supplies as instructed; Kit contains: (15) Saline Bullets; (15) 3x3 Gauze; 15 pr Gloves Cleanser: Soap and Water 3 x Per Week/30 Days Discharge Instructions: Gently cleanse wound with antibacterial soap, rinse and pat dry prior to dressing wounds Peri-Wound Care: Skin Prep (DME) (Dispense As Written) 3 x Per Week/30 Days Discharge Instructions: Use skin prep as directed Prim Dressing: Hydrofera Blue Ready Transfer Foam, 2.5x2.5 (in/in) 3 x Per Week/30 Days ary Discharge Instructions: Apply Hydrofera Blue Ready to wound bed as directed Secondary Dressing: (BORDER) Zetuvit Plus SILICONE BORDER Dressing 4x4 (in/in) (DME) (Dispense As Written) 3 x Per Week/30 Days Discharge Instructions: Please do not put silicone bordered dressings under wraps. Use non-bordered dressing only. Wound #3 - T Fourth oe Wound Laterality: Right Cleanser: Byram Ancillary Kit - 15 Day Supply (DME) (Generic) 3 x Per Week/30 Days Discharge Instructions: Use supplies as instructed; Kit contains: (15) Saline Bullets; (15) 3x3 Gauze; 15 pr Gloves Kolton, [name] ([id number]) [id number] Page 4 of 8 Cleanser: Soap and Water 3 x Per Week/30 Days Discharge Instructions: Gently cleanse wound with antibacterial soap, rinse and pat dry prior to dressing wounds Prim Dressing: Hydrofera Blue Ready Transfer Foam, 2.5x2.5 (in/in) 3 x Per  Week/30 Days ary Discharge Instructions: Apply Hydrofera Blue Ready to wound  bed as directed Secondary Dressing: Coverlet Latex-Free Fabric Adhesive Dressings 3 x Per Week/30 Days Discharge Instructions: Knuckle Electronic Signature(s) Signed: 06/15/2022 5:21:39 PM By: [name] Signed: 06/15/2022 6:26:43 PM By: Lacey Derry PA-C Entered By: [name] on 06/15/2022 11:16:22 -------------------------------------------------------------------------------- Problem List Details Patient Name: Date of Service: [name], [name]. 06/15/2022 10:00 A M Medical Record Number: [id number] Patient Account Number: [id number] Date of Birth/Sex: Treating RN: 1934-07-29 (88 y.o. [name] Primary Care Provider: Marylynn [name] Other Clinician: Betha [name] Referring Provider: Treating Provider/Extender: [name] in Treatment: 16 Active Problems ICD-10 Encounter Code Description Active Date MDM Diagnosis L89.893 Pressure ulcer of other site, stage 3 02/22/2022 No Yes L97.512 Non-pressure chronic ulcer of other part of right foot with fat layer exposed 06/15/2022 No Yes I50.42 Chronic combined systolic (congestive) and diastolic (congestive) heart failure 02/22/2022 No Yes F01.511 Vascular dementia, unspecified severity, with agitation 02/22/2022 No Yes J44.9 Chronic obstructive pulmonary disease, unspecified 02/22/2022 No Yes Inactive Problems Resolved Problems Electronic Signature(s) Signed: 06/18/2022 1:49:52 PM By: Lacey Derry PA-C Previous Signature: 06/15/2022 10:10:02 AM Version By: Lacey Derry PA-C Entered By: [name] on 06/18/2022 13:49:52 Marling, [name] ([id number]) [id number] Page 5 of 8 -------------------------------------------------------------------------------- Progress Note Details Patient Name: Date of Service: [name], [name]. 06/15/2022 10:00 A M Medical Record Number: [id number] Patient Account Number: [id number] Date of Birth/Sex: Treating RN: 01/13/1934 (88 y.o. [name] Primary Care Provider: Marylynn [name] Other Clinician: Betha [name] Referring Provider: Treating Provider/Extender: [name] in Treatment: 16 Subjective Chief Complaint Information obtained from Patient Left foot pressure ulcer and right 4th toe ulcer History of Present Illness (HPI) 02-22-2022 upon evaluation today patient appears to be doing somewhat poorly in regard to her left medial first metatarsal head where she does have a bunion. Unfortunately this [name] causing some pressure and has led to a pressure injury at this site. Fortunately I do not see any signs of infection at this point but that [name] something we definitely can need to keep a close eye on. Fortunately there does not appear to be any signs of active infection locally nor systemically at this point. No fevers, chills, nausea, vomiting, or diarrhea. Patient does have a history of of vascular dementia, COPD, congestive heart failure, and the dementia does affect her ability to be able to in some cases follow directions. She [name] very nervous about things in general. This [name] stated to have started somewhere around January 22, 2022. 03-02-2022 upon evaluation today patient appears to be doing well currently in regard to her wounds all things considered. Fortunately I do not see any signs of infection unfortunately both wounds are still open and the one on the medial foot first metatarsal location [name] still the worst. Although the area over the fifth toe right foot actually [name] also open at this point unfortunately. With that being said I do think we can need to continue to monitor for any signs of worsening or infection although right now I think she [name] doing okay in that regard. 3/4; this [name] a patient with a bunion wound on the left medial foot. She also had a small blister on the right lateral fifth toe. She went for x-rays last week no acute fracture or dislocation was seen on the left side she had  worsened moderate hallucis valgus and moderate second and third fourth fifth tarsometatarsal osteoarthritis.  We use Prisma. 3/11; patient presents for follow-up. She has been using endoform to the left medial foot wound. She has cushion pads that she uses in between the toes to help offload the areas and prevent future wounds. The right lateral fifth met head remains closed. 03-22-2022 upon evaluation today patient appears to be doing well currently in regard to her wound. She does show some signs of improvement here. She [name] also showing some signs of being a little bit too moist I think we may want to cut back on the hydrogel possibly just using the saline only at this point she [name] in agreement with that plan. Fortunately I do not see any evidence of active infection locally nor systemically which [name] great news. No fevers, chills, nausea, vomiting, or diarrhea. 03-29-22 upon evaluation today patient actually appears to be making excellent progress and very pleased with how things appear and how the wound [name] doing. I do think that we are moving in the right direction and very pleased in that regard. No fevers, chills, nausea, vomiting, or diarrhea. 04-05-2022 upon evaluation today patient appears to be doing well currently in regard to her wound. This [name] actually showing signs of improvement. Fortunately there does not appear to be any signs of active infection locally nor systemically at this time which [name] great news. No fevers, chills, nausea, vomiting, or diarrhea. 04-12-2022 upon evaluation today patient's wound actually showing signs of some improvement. Fortunately there does not appear to be any evidence of active infection locally nor systemically which [name] great news. No fevers, chills, nausea, vomiting, or diarrhea. 04-19-2022 upon evaluation today patient actually appears to be doing quite well in regard to her wound. She has been tolerating the dressing changes without complication. Fortunately  there does not appear to be any signs of active infection locally or systemically which [name] great news. No fevers, chills, nausea, vomiting, or diarrhea. 05-03-2022 upon evaluation today patient appears to be doing better in regard to her foot ulcer this [name] actually very close to complete resolution. Fortunately I do not see any signs of active infection locally nor systemically. 05-10-2022 upon evaluation today patient appears to be doing well currently in regard to her wound there [name] some need for sharp debridement but minimal. 05-17-2022 upon evaluation today patient appears to be doing well currently in regard to her foot ulcer. This [name] still continue to make good progress. Fortunately there does not appear to be any signs of active infection locally nor systemically 05-24-2022 upon patient today patient actually appears to be doing to her wound. This seems to be making some slow I am extremely pleased with where we stand currently. I do not see any signs of infection locally nor systemically which [name] great news. 06-01-2022 upon evaluation today patient's wound actually appears to be doing excellent. I do not see any signs of active infection at this time which [name] great news and overall I think she [name] actually very close to complete resolution. I am extremely pleased with where things stand today. In general I think that were moving appropriately towards closure. 06-07-2022 upon evaluation today patient appears to be doing well currently in regard to her wound. This [name] still a little bit open we were hoping that it might be closed but again this does not appear to be the case. Fortunately I do not see any signs of worsening I think she [name] doing well in general however [name], [name] ([id number]) [id number] Page 6 of 8  06-15-2022 upon evaluation today patient appears to be doing better in regard to the wound on her left foot although she does have a new wound on her right foot at  her toe and this [name] a pressure ulcer at this time location. With that being said I do not see any signs of infection here obvious at the moment but again this does appear to be somewhat painful for her and again this [name] new even compared to last week when I last saw her. The left foot seems to be doing much better. Objective Constitutional Well-nourished and well-hydrated in no acute distress. Vitals Time Taken: 10:10 AM, Weight: 105 lbs, Temperature: 97.9 F, Pulse: 83 bpm, Respiratory Rate: 16 breaths/min, Blood Pressure: 132/85 mmHg. Respiratory normal breathing without difficulty. Psychiatric this patient [name] able to make decisions and demonstrates good insight into disease process. Alert and Oriented x 3. pleasant and cooperative. General Notes: Upon inspection patient actually appears to again be doing great in regard to her left foot which I do not see any obvious signs of issue with currently today. The right foot however appears to be doing a bit worse and specifically with regard to the fourth toe there [name] a small opening that though not being it does appear to be deeper and we do need to treat this obviously. Integumentary (Hair, Skin) Wound #1 status [name] Open. Original cause of wound was Gradually Appeared. The date acquired was: 01/22/2022. The wound has been in treatment 16 [name]. The wound [name] located on the Left,Distal,Medial Foot. The wound measures 0.1cm length x 0.1cm width x 0.1cm depth; 0.008cm^2 area and 0.001cm^3 volume. There [name] Fat Layer (Subcutaneous Tissue) exposed. There [name] a medium amount of serosanguineous drainage noted. There [name] small (1-33%) pink, pale granulation within the wound bed. There [name] a large (67-100%) amount of necrotic tissue within the wound bed including Adherent Slough. Wound #3 status [name] Open. Original cause of wound was Pressure Injury. The date acquired was: 06/15/2022. The wound [name] located on the Right T Fourth. The oe wound measures 0.7cm length x  0.6cm width x 0.2cm depth; 0.33cm^2 area and 0.066cm^3 volume. There [name] no tunneling or undermining noted. There [name] a medium amount of serosanguineous drainage noted. Assessment Active Problems ICD-10 Pressure ulcer of other site, stage 3 Non-pressure chronic ulcer of other part of right foot with fat layer exposed Chronic combined systolic (congestive) and diastolic (congestive) heart failure Vascular dementia, unspecified severity, with agitation Chronic obstructive pulmonary disease, unspecified Plan Follow-up Appointments: Return Appointment in 1 week. Bathing/ Shower/ Hygiene: Wash wounds with antibacterial soap and water. Anesthetic (Use 'Patient Medications' Section for Anesthetic Order Entry): Lidocaine applied to wound bed Edema Control - Lymphedema / Segmental Compressive Device / Other: Elevate, Exercise Daily and Avoid Standing for Long Periods of Time. Elevate legs to the level of the heart and pump ankles as often as possible Elevate leg(s) parallel to the floor when sitting. WOUND #1: - Foot Wound Laterality: Left, Medial, Distal Cleanser: Byram Ancillary Kit - 15 Day Supply (DME) (Generic) 3 x Per Week/30 Days Discharge Instructions: Use supplies as instructed; Kit contains: (15) Saline Bullets; (15) 3x3 Gauze; 15 pr Gloves Cleanser: Soap and Water 3 x Per Week/30 Days Discharge Instructions: Gently cleanse wound with antibacterial soap, rinse and pat dry prior to dressing wounds Peri-Wound Care: Skin Prep (DME) (Dispense As Written) 3 x Per Week/30 Days Discharge Instructions: Use skin prep as directed Prim Dressing: Hydrofera Blue Ready Transfer Foam, 2.5x2.5 (in/in) 3 x  Per Week/30 Days ary Discharge Instructions: Apply Hydrofera Blue Ready to wound bed as directed Secondary Dressing: (BORDER) Zetuvit Plus SILICONE BORDER Dressing 4x4 (in/in) (DME) (Dispense As Written) 3 x Per Week/30 Days Discharge Instructions: Please do not put silicone bordered dressings under  wraps. Use non-bordered dressing only. WOUND #3: - T Fourth Wound Laterality: Right oe Cleanser: Byram Ancillary Kit - 15 Day Supply (DME) (Generic) 3 x Per Week/30 Days Discharge Instructions: Use supplies as instructed; Kit contains: (15) Saline Bullets; (15) 3x3 Gauze; 15 pr Gloves [name], [name] ([id number]) [id number] Page 7 of 8 Cleanser: Soap and Water 3 x Per Week/30 Days Discharge Instructions: Gently cleanse wound with antibacterial soap, rinse and pat dry prior to dressing wounds Prim Dressing: Hydrofera Blue Ready Transfer Foam, 2.5x2.5 (in/in) 3 x Per Week/30 Days ary Discharge Instructions: Apply Hydrofera Blue Ready to wound bed as directed Secondary Dressing: Coverlet Latex-Free Fabric Adhesive Dressings 3 x Per Week/30 Days Discharge Instructions: Knuckle 1. Based on what I am seeing I do believe that the patient would benefit from a initiation of therapy with Hydrofera Blue and using silver both locations actually left foot where appear pretty much healed. Were also going to use this over the right foot fourth toe. 2. I am good recommend as well that we have the patient continue to monitor for any evidence of infection or worsening. Obviously I do feel like that we are making good headway towards complete closure which [name] good news. At least in regard to the left foot. The right fourth toe [name] a new opening and again this [name] going to take a little time to get it under control but I am hopeful that we will be able to. We will see patient back for reevaluation in 1 week here in the clinic. If anything worsens or changes patient will contact our office for additional recommendations. Electronic Signature(s) Signed: 06/18/2022 1:52:54 PM By: Lacey Derry PA-C Entered By: [name] on 06/18/2022 13:52:54 -------------------------------------------------------------------------------- SuperBill Details Patient Name: Date of Service: [name], [name].  06/15/2022 Medical Record Number: [id number] Patient Account Number: [id number] Date of Birth/Sex: Treating RN: 06/27/1934 (88 y.o. [name] Primary Care Provider: Marylynn [name] Other Clinician: Betha [name] Referring Provider: Treating Provider/Extender: [name] in Treatment: 16 Diagnosis Coding ICD-10 Codes Code Description [phone] Pressure ulcer of other site, stage 3 I50.42 Chronic combined systolic (congestive) and diastolic (congestive) heart failure F01.511 Vascular dementia, unspecified severity, with agitation J44.9 Chronic obstructive pulmonary disease, unspecified Facility Procedures : CPT4 Code: [id number] Description: 99213 - WOUND CARE VISIT-LEV 3 EST PT Modifier: Quantity: 1 Physician Procedures : CPT4 Code Description Modifier [id number] 99213 - WC PHYS LEVEL 3 - EST PT ICD-10 Diagnosis Description L89.893 Pressure ulcer of other site, stage 3 I50.42 Chronic combined systolic (congestive) and diastolic (congestive) heart failure F01.511 Vascular  dementia, unspecified severity, with agitation J44.9 Chronic obstructive pulmonary disease, unspecified Quantity: 1 Electronic Signature(s) Signed: 06/15/2022 6:19:46 PM By: Lacey Derry PA-C Previous Signature: 06/15/2022 5:21:39 PM Version By: [name] Entered By: [name] on 06/15/2022 18:19:46 Brunette, Dajanique [name] ([id number]) [id number] Page 8 of 8

## 2022-06-16 NOTE — Progress Notes (Addendum)
TAMILORE, STEININGER ([id number]) [id number] Page 1 of 10 Visit Report for 06/15/2022 Arrival Information Details Patient Name: Date of Service: Lacey V IS, [name] 06/15/2022 10:00 A M Medical Record Number: [id number] Patient Account Number: [id number] Date of Birth/Sex: Treating RN: 1934/09/01 (88 y.o. [name] Primary Care Constancia Geeting: Marylynn Pearson Other Clinician: Betha Loa Referring Makynzee Tigges: Treating Yoona Ishii/Extender: Lorna Dibble Weeks in Treatment: 16 Visit Information History Since Last Visit All ordered tests and consults were completed: No Patient Arrived: Wheel Chair Added or deleted any medications: No Arrival Time: 10:04 Any new allergies or adverse reactions: No Transfer Assistance: EasyPivot Patient Lift Had a fall or experienced change in No Patient Identification Verified: Yes activities of daily living that may affect Secondary Verification Process Completed: Yes risk of falls: Patient Requires Transmission-Based Precautions: No Signs or symptoms of abuse/neglect since last visito No Patient Has Alerts: No Hospitalized since last visit: No Implantable device outside of the clinic excluding No cellular tissue based products placed in the center since last visit: Has Dressing in Place as Prescribed: Yes Pain Present Now: No Electronic Signature(s) Signed: 06/15/2022 5:21:39 PM By: Betha Loa Entered By: Betha Loa on 06/15/2022 10:10:15 -------------------------------------------------------------------------------- Clinic Level of Care Assessment Details Patient Name: Date of Service: Lacey V IS, [name] 06/15/2022 10:00 A M Medical Record Number: [id number] Patient Account Number: [id number] Date of Birth/Sex: Treating RN: 01/26/1934 (88 y.o. [name] Primary Care Aashrith Eves: Marylynn Pearson Other Clinician: Betha Loa Referring Stana Bayon: Treating Aliciana Ricciardi/Extender: Lorna Dibble Weeks in Treatment: 16 Clinic Level of Care Assessment Items TOOL 4 Quantity Score []  - 0 Use when only an EandM is performed on FOLLOW-UP visit ASSESSMENTS - Nursing Assessment / Reassessment X- 1 10 Reassessment of Co-morbidities (includes updates in patient status) X- 1 5 Reassessment of Adherence to Treatment Plan Erway, Mikiyah L ([id number]) [id number] Page 2 of 10 ASSESSMENTS - Wound and Skin A ssessment / Reassessment []  - 0 Simple Wound Assessment / Reassessment - one wound X- 2 5 Complex Wound Assessment / Reassessment - multiple wounds []  - 0 Dermatologic / Skin Assessment (not related to wound area) ASSESSMENTS - Focused Assessment []  - 0 Circumferential Edema Measurements - multi extremities []  - 0 Nutritional Assessment / Counseling / Intervention []  - 0 Lower Extremity Assessment (monofilament, tuning fork, pulses) []  - 0 Peripheral Arterial Disease Assessment (using hand held doppler) ASSESSMENTS - Ostomy and/or Continence Assessment and Care []  - 0 Incontinence Assessment and Management []  - 0 Ostomy Care Assessment and Management (repouching, etc.) PROCESS - Coordination of Care X - Simple Patient / Family Education for ongoing care 1 15 []  - 0 Complex (extensive) Patient / Family Education for ongoing care []  - 0 Staff obtains Chiropractor, Records, T Results / Process Orders est []  - 0 Staff telephones HHA, Nursing Homes / Clarify orders / etc []  - 0 Routine Transfer to another Facility (non-emergent condition) []  - 0 Routine Hospital Admission (non-emergent condition) []  - 0 New Admissions / Manufacturing engineer / Ordering NPWT Apligraf, etc. , []  - 0 Emergency Hospital Admission (emergent condition) X- 1 10 Simple Discharge Coordination []  - 0 Complex (extensive) Discharge Coordination PROCESS - Special Needs []  - 0 Pediatric / Minor Patient Management []  - 0 Isolation Patient Management []  - 0 Hearing /  Language / Visual special needs []  - 0 Assessment of Community assistance (transportation, D/C planning, etc.) []  - 0 Additional assistance / Altered mentation []  - 0 Support Surface(s) Assessment (bed, cushion,  seat, etc.) INTERVENTIONS - Wound Cleansing / Measurement []  - 0 Simple Wound Cleansing - one wound X- 2 5 Complex Wound Cleansing - multiple wounds X- 1 5 Wound Imaging (photographs - any number of wounds) []  - 0 Wound Tracing (instead of photographs) []  - 0 Simple Wound Measurement - one wound X- 2 5 Complex Wound Measurement - multiple wounds INTERVENTIONS - Wound Dressings X - Small Wound Dressing one or multiple wounds 2 10 []  - 0 Medium Wound Dressing one or multiple wounds []  - 0 Large Wound Dressing one or multiple wounds []  - 0 Application of Medications - topical []  - 0 Application of Medications - injection INTERVENTIONS - Miscellaneous []  - 0 External ear exam Stofer, Chanda L ([id number]) [id number] Page 3 of 10 []  - 0 Specimen Collection (cultures, biopsies, blood, body fluids, etc.) []  - 0 Specimen(s) / Culture(s) sent or taken to Lab for analysis []  - 0 Patient Transfer (multiple staff / Michiel Sites Lift / Similar devices) []  - 0 Simple Staple / Suture removal (25 or less) []  - 0 Complex Staple / Suture removal (26 or more) []  - 0 Hypo / Hyperglycemic Management (close monitor of Blood Glucose) []  - 0 Ankle / Brachial Index (ABI) - do not check if billed separately X- 1 5 Vital Signs Has the patient been seen at the hospital within the last three years: Yes Total Score: 100 Level Of Care: New/Established - Level 3 Electronic Signature(s) Signed: 06/15/2022 5:21:39 PM By: Betha Loa Entered By: Betha Loa on 06/15/2022 11:00:07 -------------------------------------------------------------------------------- Encounter Discharge Information Details Patient Name: Date of Service: Lacey V IS, [name] 06/15/2022 10:00  A M Medical Record Number: [id number] Patient Account Number: [id number] Date of Birth/Sex: Treating RN: [birth year] (88 y.o. [name] Primary Care Ash Mcelwain: Marylynn Pearson Other Clinician: Betha Loa Referring Janit Cutter: Treating Gabino Hagin/Extender: Lorna Dibble Weeks in Treatment: 16 Encounter Discharge Information Items Discharge Condition: Stable Ambulatory Status: Wheelchair Discharge Destination: Home Transportation: Private Auto Accompanied By: daughter Schedule Follow-up Appointment: Yes Clinical Summary of Care: Electronic Signature(s) Signed: 06/15/2022 5:21:39 PM By: Betha Loa Entered By: Betha Loa on 06/15/2022 11:10:26 Lower Extremity Assessment Details -------------------------------------------------------------------------------- [name] ([id number]) [id number] Page 4 of 10 Patient Name: Date of Service: Lacey V IS, Aashritha L. 06/15/2022 10:00 A M Medical Record Number: [id number] Patient Account Number: [id number] Date of Birth/Sex: Treating RN: December 16, 1934 (88 y.o. [name] Primary Care Aalaysia Liggins: Marylynn Pearson Other Clinician: Betha Loa Referring Larine Fielding: Treating Carroll Ranney/Extender: Lorna Dibble Weeks in Treatment: 16 Electronic Signature(s) Signed: 06/15/2022 4:52:35 PM By: Midge Aver MSN RN CNS WTA Signed: 06/15/2022 5:21:39 PM By: Betha Loa Entered By: Betha Loa on 06/15/2022 10:17:05 -------------------------------------------------------------------------------- Multi Wound Chart Details Patient Name: Date of Service: Lacey V IS, [name] 06/15/2022 10:00 A M Medical Record Number: [id number] Patient Account Number: [id number] Date of Birth/Sex: Treating RN: May 11, 1934 (88 y.o. [name] Primary Care Louie Meaders: Marylynn Pearson Other Clinician: Betha Loa Referring Emil Weigold: Treating Troye Hiemstra/Extender: Lorna Dibble Weeks in  Treatment: 16 Vital Signs Height(in): Pulse(bpm): 83 Weight(lbs): 105 Blood Pressure(mmHg): 132/85 Body Mass Index(BMI): Temperature(F): 97.9 Respiratory Rate(breaths/min): 16 [1:Photos:] [N/A:N/A] Left, Distal, Medial Foot N/A N/A Wound Location: Gradually Appeared N/A N/A Wounding Event: Pressure Ulcer N/A N/A Primary Etiology: Chronic Obstructive Pulmonary N/A N/A Comorbid History: Disease (COPD), Congestive Heart Failure, Dementia 01/22/2022 N/A N/A Date Acquired: 16 N/A N/A Weeks of Treatment: Open N/A N/A Wound Status: No N/A N/A Wound Recurrence: 0.1x0.1x0.1 N/A N/A Measurements L  x W x D (cm) 0.008 N/A N/A A (cm) : rea 0.001 N/A N/A Volume (cm) : 88.70% N/A N/A % Reduction in A rea: 92.90% N/A N/A % Reduction in Volume: Category/Stage III N/A N/A Classification: Medium N/A N/A Exudate A mount: Serosanguineous N/A N/A Exudate Type: red, brown N/A N/A Exudate Color: Small (1-33%) N/A N/A Granulation A mount: Pink, Pale N/A N/A Granulation Quality: Large (67-100%) N/A N/A Necrotic A mount: Fat Layer (Subcutaneous Tissue): Yes N/A N/A Exposed Structures: Fascia: No Yero, Joycelin L ([id number]) [id number] Page 5 of 10 Tendon: No Muscle: No Joint: No Bone: No Medium (34-66%) N/A N/A Epithelialization: Treatment Notes Electronic Signature(s) Signed: 06/15/2022 5:21:39 PM By: Betha Loa Entered By: Betha Loa on 06/15/2022 10:17:09 -------------------------------------------------------------------------------- Multi-Disciplinary Care Plan Details Patient Name: Date of Service: Lacey V IS, Nusaiba L. 06/15/2022 10:00 A M Medical Record Number: [id number] Patient Account Number: [id number] Date of Birth/Sex: Treating RN: 1934/09/19 (88 y.o. [name] Primary Care Khalib Fendley: Marylynn Pearson Other Clinician: Betha Loa Referring Thos Matsumoto: Treating Daunte Oestreich/Extender: Lorna Dibble Weeks in  Treatment: 16 Active Inactive Wound/Skin Impairment Nursing Diagnoses: Knowledge deficit related to ulceration/compromised skin integrity Goals: Patient/caregiver will verbalize understanding of skin care regimen Date Initiated: 02/22/2022 Date Inactivated: 04/19/2022 Target Resolution Date: 04/23/2022 Goal Status: Met Ulcer/skin breakdown will have a volume reduction of 30% by week 4 Date Initiated: 02/22/2022 Date Inactivated: 04/05/2022 Target Resolution Date: 03/23/2022 Goal Status: Unmet Unmet Reason: comorbities Ulcer/skin breakdown will have a volume reduction of 50% by week 8 Date Initiated: 02/22/2022 Date Inactivated: 04/19/2022 Target Resolution Date: 04/19/2022 Unmet Reason: underlying conditions, Goal Status: Unmet wound improving Ulcer/skin breakdown will have a volume reduction of 80% by week 12 Date Initiated: 02/22/2022 Target Resolution Date: 05/23/2022 Goal Status: Active Ulcer/skin breakdown will heal within 14 weeks Date Initiated: 02/22/2022 Target Resolution Date: 06/23/2022 Goal Status: Active Interventions: Assess patient/caregiver ability to obtain necessary supplies Assess patient/caregiver ability to perform ulcer/skin care regimen upon admission and as needed Assess ulceration(s) every visit Notes: Electronic Signature(s) Signed: 06/15/2022 4:52:35 PM By: Midge Aver MSN RN CNS WTA Signed: 06/15/2022 5:21:39 PM By: Betha Loa Entered By: Betha Loa on 06/15/2022 11:00:19 Symonds, Baylee L ([id number]) [id number] Page 6 of 10 -------------------------------------------------------------------------------- Pain Assessment Details Patient Name: Date of Service: Lacey V IS, [name] 06/15/2022 10:00 A M Medical Record Number: [id number] Patient Account Number: [id number] Date of Birth/Sex: Treating RN: 09-10-1934 (88 y.o. [name] Primary Care Natalye Kott: Marylynn Pearson Other Clinician: Betha Loa Referring  Sasan Wilkie: Treating Tharon Bomar/Extender: Lorna Dibble Weeks in Treatment: 16 Active Problems Location of Pain Severity and Description of Pain Patient Has Paino No Site Locations Pain Management and Medication Current Pain Management: Electronic Signature(s) Signed: 06/15/2022 4:52:35 PM By: Midge Aver MSN RN CNS WTA Signed: 06/15/2022 5:21:39 PM By: Betha Loa Entered By: Betha Loa on 06/15/2022 10:12:40 -------------------------------------------------------------------------------- Patient/Caregiver Education Details Patient Name: Date of Service: Lacey V IS, [name] 6/11/2024andnbsp10:00 A M Medical Record Number: [id number] Patient Account Number: [id number] Date of Birth/Gender: Treating RN: [birth year] (88 y.o. [name] Primary Care Physician: Marylynn Pearson Other Clinician: Betha Loa Referring Physician: Treating Physician/Extender: Lorna Dibble [place], [place] ([id number]) [phone].pdf Page 7 of 10 Weeks in Treatment: 16 Education Assessment Education Provided To: Patient and Caregiver Education Topics Provided Wound/Skin Impairment: Handouts: Other: continue wound care as directed Methods: Explain/Verbal Responses: State content correctly Electronic Signature(s) Signed: 06/15/2022 5:21:39 PM By: Betha Loa Entered By: Betha Loa on 06/15/2022 11:09:49 -------------------------------------------------------------------------------- Wound  Assessment Details Patient Name: Date of Service: Lacey V IS, Arshi L. 06/15/2022 10:00 A M Medical Record Number: [id number] Patient Account Number: [id number] Date of Birth/Sex: Treating RN: [birth year] (88 y.o. [name] Primary Care Saide Lanuza: Marylynn Pearson Other Clinician: Betha Loa Referring Bridie Colquhoun: Treating Gailene Youkhana/Extender: Lorna Dibble Weeks in Treatment: 16 Wound Status Wound Number: 1 Primary Pressure  Ulcer Etiology: Wound Location: Left, Distal, Medial Foot Wound Open Wounding Event: Gradually Appeared Status: Date Acquired: 01/22/2022 Comorbid Chronic Obstructive Pulmonary Disease (COPD), Congestive Weeks Of Treatment: 16 History: Heart Failure, Dementia Clustered Wound: No Photos Wound Measurements Length: (cm) 0.1 Width: (cm) 0.1 Depth: (cm) 0.1 Area: (cm) 0.008 Volume: (cm) 0.001 % Reduction in Area: 88.7% % Reduction in Volume: 92.9% Epithelialization: Medium (34-66%) Wound Description Classification: Category/Stage III Ebner, Veneta L ([id number]) Exudate Amount: Medium Exudate Type: Serosanguineous Exudate Color: red, brown Foul Odor After Cleansing: No [phone].pdf Page 8 of 10 Slough/Fibrino Yes Wound Bed Granulation Amount: Small (1-33%) Exposed Structure Granulation Quality: Pink, Pale Fascia Exposed: No Necrotic Amount: Large (67-100%) Fat Layer (Subcutaneous Tissue) Exposed: Yes Necrotic Quality: Adherent Slough Tendon Exposed: No Muscle Exposed: No Joint Exposed: No Bone Exposed: No Treatment Notes Wound #1 (Foot) Wound Laterality: Left, Medial, Distal Cleanser Byram Ancillary Kit - 15 Day Supply Discharge Instruction: Use supplies as instructed; Kit contains: (15) Saline Bullets; (15) 3x3 Gauze; 15 pr Gloves Soap and Water Discharge Instruction: Gently cleanse wound with antibacterial soap, rinse and pat dry prior to dressing wounds Peri-Wound Care Skin Prep Discharge Instruction: Use skin prep as directed Topical Primary Dressing Hydrofera Blue Ready Transfer Foam, 2.5x2.5 (in/in) Discharge Instruction: Apply Hydrofera Blue Ready to wound bed as directed Secondary Dressing (BORDER) Zetuvit Plus SILICONE BORDER Dressing 4x4 (in/in) Discharge Instruction: Please do not put silicone bordered dressings under wraps. Use non-bordered dressing only. Secured With Compression Wrap Compression Stockings Geologist, engineering) Signed: 06/15/2022 4:52:35 PM By: Midge Aver MSN RN CNS WTA Signed: 06/15/2022 5:21:39 PM By: Betha Loa Entered By: Betha Loa on 06/15/2022 10:16:57 -------------------------------------------------------------------------------- Wound Assessment Details Patient Name: Date of Service: Lacey V IS, [name] 06/15/2022 10:00 A M Medical Record Number: [id number] Patient Account Number: [id number] Date of Birth/Sex: Treating RN: 1934/10/08 (88 y.o. [name] Primary Care Treyvone Chelf: Marylynn Pearson Other Clinician: Betha Loa Referring Jaslyne Beeck: Treating Cavin Longman/Extender: Lorna Dibble Weeks in Treatment: 16 Wound Status Wound Number: 3 Primary Pressure Ulcer Etiology: Wound Location: Right T Fourth Kambryn Lundrigan, Corynne L ([id number]) [phone].pdf Page 9 of 10 Wound Open Wounding Event: Pressure Injury Status: Date Acquired: 06/15/2022 Comorbid Chronic Obstructive Pulmonary Disease (COPD), Congestive Weeks Of Treatment: 0 History: Heart Failure, Dementia Clustered Wound: No Photos Wound Measurements Length: (cm) 0.7 Width: (cm) 0.6 Depth: (cm) 0.2 Area: (cm) 0.33 Volume: (cm) 0.066 % Reduction in Area: % Reduction in Volume: Epithelialization: None Tunneling: No Undermining: No Wound Description Classification: Category/Stage III Exudate Amount: Medium Exudate Type: Serosanguineous Exudate Color: red, brown Foul Odor After Cleansing: No Slough/Fibrino Yes Treatment Notes Wound #3 (Toe Fourth) Wound Laterality: Right Cleanser Byram Ancillary Kit - 15 Day Supply Discharge Instruction: Use supplies as instructed; Kit contains: (15) Saline Bullets; (15) 3x3 Gauze; 15 pr Gloves Soap and Water Discharge Instruction: Gently cleanse wound with antibacterial soap, rinse and pat dry prior to dressing wounds Peri-Wound Care Topical Primary Dressing Hydrofera Blue Ready Transfer Foam, 2.5x2.5 (in/in) Discharge  Instruction: Apply Hydrofera Blue Ready to wound bed as directed Secondary Dressing Coverlet Latex-Free Fabric Adhesive Dressings Discharge Instruction: Knuckle  Secured With Compression Wrap Compression Stockings Facilities manager) Signed: 06/15/2022 4:52:35 PM By: Midge Aver MSN RN CNS WTA Signed: 06/15/2022 5:21:39 PM By: Betha Loa Entered By: Betha Loa on 06/15/2022 10:57:21 Ritsema, Kaitlen L ([id number]) [id number] Page 10 of 10 -------------------------------------------------------------------------------- Vitals Details Patient Name: Date of Service: Lacey V IS, [name] 06/15/2022 10:00 A M Medical Record Number: [id number] Patient Account Number: [id number] Date of Birth/Sex: Treating RN: [birth year] (88 y.o. [name] Primary Care Kenly Henckel: Marylynn Pearson Other Clinician: Betha Loa Referring Brendaly Townsel: Treating Brynda Heick/Extender: Lorna Dibble Weeks in Treatment: 16 Vital Signs Time Taken: 10:10 Temperature (F): 97.9 Weight (lbs): 105 Pulse (bpm): 83 Respiratory Rate (breaths/min): 16 Blood Pressure (mmHg): 132/85 Reference Range: 80 - 120 mg / dl Electronic Signature(s) Signed: 06/15/2022 5:21:39 PM By: Betha Loa Entered By: Betha Loa on 06/15/2022 10:12:37

## 2022-06-22 ENCOUNTER — Encounter: Payer: Medicare Other | Admitting: Physician Assistant

## 2022-06-22 DIAGNOSIS — L89893 Pressure ulcer of other site, stage 3: Secondary | ICD-10-CM | POA: Diagnosis not present

## 2022-06-22 NOTE — Progress Notes (Addendum)
AHMARI, ORRIS ([id number]) [id number] Page 1 of 9 Visit Report for 06/22/2022 Arrival Information Details Patient Name: Date of Service: [name], [name] 06/22/2022 3:45 PM Medical Record Number: [id number] Patient Account Number: [id number] Date of Birth/Sex: Treating RN: [birth year] (88 y.o. [name] Primary Care Jamilah Jean: Marylynn Pearson Other Clinician: Betha Loa Referring Daune Colgate: Treating Verda Mehta/Extender: Lorna Dibble Weeks in Treatment: 17 Visit Information History Since Last Visit Added or deleted any medications: No Patient Arrived: Wheel Chair Any new allergies or adverse reactions: No Arrival Time: 15:53 Had a fall or experienced change in No Accompanied By: daughter activities of daily living that may affect Transfer Assistance: EasyPivot Patient Lift risk of falls: Patient Identification Verified: Yes Hospitalized since last visit: No Secondary Verification Process Completed: Yes Has Dressing in Place as Prescribed: Yes Patient Requires Transmission-Based Precautions: No Pain Present Now: No Patient Has Alerts: No Electronic Signature(s) Signed: 06/22/2022 4:02:15 PM By: Angelina Pih Entered By: Angelina Pih on 06/22/2022 15:54:17 -------------------------------------------------------------------------------- Clinic Level of Care Assessment Details Patient Name: Date of Service: [name], [name]. 06/22/2022 3:45 PM Medical Record Number: [id number] Patient Account Number: [id number] Date of Birth/Sex: Treating RN: 1934-11-17 (88 y.o. [name] Primary Care Shaneisha Burkel: Marylynn Pearson Other Clinician: Betha Loa Referring Daylynn Stumpp: Treating Elesa Garman/Extender: Lorna Dibble Weeks in Treatment: 17 Clinic Level of Care Assessment Items TOOL 1 Quantity Score []  - 0 Use when EandM and Procedure [name] performed on INITIAL visit ASSESSMENTS - Nursing Assessment /  Reassessment []  - 0 General Physical Exam (combine w/ comprehensive assessment (listed just below) when performed on new pt. evals) []  - 0 Comprehensive Assessment (HX, ROS, Risk Assessments, Wounds Hx, etc.) ASSESSMENTS - Wound and Skin Assessment / Reassessment []  - 0 Dermatologic / Skin Assessment (not related to wound area) ASSESSMENTS - Ostomy and/or Continence Assessment and Care Petrosian, Tere [name] ([id number]) [id number] Page 2 of 9 []  - 0 Incontinence Assessment and Management []  - 0 Ostomy Care Assessment and Management (repouching, etc.) PROCESS - Coordination of Care []  - 0 Simple Patient / Family Education for ongoing care []  - 0 Complex (extensive) Patient / Family Education for ongoing care []  - 0 Staff obtains Chiropractor, Records, T Results / Process Orders est []  - 0 Staff telephones HHA, Nursing Homes / Clarify orders / etc []  - 0 Routine Transfer to another Facility (non-emergent condition) []  - 0 Routine Hospital Admission (non-emergent condition) []  - 0 New Admissions / Manufacturing engineer / Ordering NPWT Apligraf, etc. , []  - 0 Emergency Hospital Admission (emergent condition) PROCESS - Special Needs []  - 0 Pediatric / Minor Patient Management []  - 0 Isolation Patient Management []  - 0 Hearing / Language / Visual special needs []  - 0 Assessment of Community assistance (transportation, D/C planning, etc.) []  - 0 Additional assistance / Altered mentation []  - 0 Support Surface(s) Assessment (bed, cushion, seat, etc.) INTERVENTIONS - Miscellaneous []  - 0 External ear exam []  - 0 Patient Transfer (multiple staff / Nurse, adult / Similar devices) []  - 0 Simple Staple / Suture removal (25 or less) []  - 0 Complex Staple / Suture removal (26 or more) []  - 0 Hypo/Hyperglycemic Management (do not check if billed separately) []  - 0 Ankle / Brachial Index (ABI) - do not check if billed separately Has the patient been seen at  the hospital within the last three years: Yes Total Score: 0 Level Of Care: ____ Electronic Signature(s) Signed: 06/22/2022 4:54:19 PM By: Betha Loa Entered By:  Betha Loa on 06/22/2022 16:30:25 -------------------------------------------------------------------------------- Encounter Discharge Information Details Patient Name: Date of Service: [name], [name] 06/22/2022 3:45 PM Medical Record Number: [id number] Patient Account Number: [id number] Date of Birth/Sex: Treating RN: 05-26-1934 (88 y.o. [name] Primary Care Anieya Helman: Marylynn Pearson Other Clinician: Betha Loa Referring Loeta Herst: Treating Xee Hollman/Extender: Lorna Dibble Weeks in Treatment: 17 Encounter Discharge Information Items Post Procedure Vitals Discharge Condition: Stable Temperature (F): 98.1 Barraclough, Tamanika [name] ([id number]) [id number] Page 3 of 9 Ambulatory Status: Wheelchair Pulse (bpm): 87 Discharge Destination: Home Respiratory Rate (breaths/min): 18 Transportation: Private Auto Blood Pressure (mmHg): 134/89 Accompanied By: daughter Schedule Follow-up Appointment: Yes Clinical Summary of Care: Electronic Signature(s) Signed: 06/22/2022 4:54:19 PM By: Betha Loa Entered By: Betha Loa on 06/22/2022 16:36:56 -------------------------------------------------------------------------------- Lower Extremity Assessment Details Patient Name: Date of Service: [name], [name]. 06/22/2022 3:45 PM Medical Record Number: [id number] Patient Account Number: [id number] Date of Birth/Sex: Treating RN: July 25, 1934 (88 y.o. [name] Primary Care Jahmarion Popoff: Marylynn Pearson Other Clinician: Betha Loa Referring Ronnel Zuercher: Treating Clyda Smyth/Extender: Lorna Dibble Weeks in Treatment: 17 Electronic Signature(s) Unsigned Entered By: Angelina Pih on 06/22/2022  16:08:12 -------------------------------------------------------------------------------- Multi Wound Chart Details Patient Name: Date of Service: [name], [name]. 06/22/2022 3:45 PM Medical Record Number: [id number] Patient Account Number: [id number] Date of Birth/Sex: Treating RN: [birth year] (88 y.o. [name] Primary Care Emoni Whitworth: Marylynn Pearson Other Clinician: Betha Loa Referring Lucia Mccreadie: Treating Jartavious Mckimmy/Extender: Lorna Dibble Weeks in Treatment: 17 Vital Signs Height(in): Pulse(bpm): 87 Weight(lbs): 105 Blood Pressure(mmHg): 134/89 Body Mass Index(BMI): Temperature(F): 98.1 Respiratory Rate(breaths/min): 18 [1:Photos:] [N/A:N/A [id number] Page 4 of 9] Left, Distal, Medial Foot Right T Fourth oe N/A Wound Location: Gradually Appeared Pressure Injury N/A Wounding Event: Pressure Ulcer Pressure Ulcer N/A Primary Etiology: Chronic Obstructive Pulmonary Chronic Obstructive Pulmonary N/A Comorbid History: Disease (COPD), Congestive Heart Disease (COPD), Congestive Heart Failure, Dementia Failure, Dementia 01/22/2022 06/15/2022 N/A Date Acquired: 17 1 N/A Weeks of Treatment: Open Open N/A Wound Status: No No N/A Wound Recurrence: 0.1x0.1x0.1 0.1x0.1x0.1 N/A Measurements [name] x W x D (cm) 0.008 0.008 N/A A (cm) : rea 0.001 0.001 N/A Volume (cm) : 88.70% 97.60% N/A % Reduction in A rea: 92.90% 98.50% N/A % Reduction in Volume: Category/Stage III Category/Stage III N/A Classification: Medium Medium N/A Exudate A mount: Serosanguineous Serosanguineous N/A Exudate Type: red, brown red, brown N/A Exudate Color: Small (1-33%) N/A N/A Granulation A mount: Pink, Pale N/A N/A Granulation Quality: Large (67-100%) N/A N/A Necrotic A mount: Fat Layer (Subcutaneous Tissue): Yes N/A N/A Exposed Structures: Fascia: No Tendon: No Muscle: No Joint: No Bone: No Medium (34-66%) None  N/A Epithelialization: Treatment Notes Electronic Signature(s) Unsigned Entered By: Angelina Pih on 06/22/2022 16:08:17 -------------------------------------------------------------------------------- Multi-Disciplinary Care Plan Details Patient Name: Date of Service: [name], [name]. 06/22/2022 3:45 PM Medical Record Number: [id number] Patient Account Number: [id number] Date of Birth/Sex: Treating RN: 1934-09-15 (88 y.o. [name] Primary Care Witten Certain: Marylynn Pearson Other Clinician: Betha Loa Referring Jaana Brodt: Treating Jacia Sickman/Extender: Lorna Dibble Weeks in Treatment: 17 Active Inactive Wound/Skin Impairment Nursing Diagnoses: Knowledge deficit related to ulceration/compromised skin integrity Goals: CARINNA, ROHAL ([id number]) [id number] Page 5 of 9 Patient/caregiver will verbalize understanding of skin care regimen Date Initiated: 02/22/2022 Date Inactivated: 04/19/2022 Target Resolution Date: 04/23/2022 Goal Status: Met Ulcer/skin breakdown will have a volume reduction of 30% by week 4 Date Initiated: 02/22/2022 Date Inactivated: 04/05/2022 Target Resolution Date: 03/23/2022 Goal Status: Unmet Unmet Reason: comorbities  Ulcer/skin breakdown will have a volume reduction of 50% by week 8 Date Initiated: 02/22/2022 Date Inactivated: 04/19/2022 Target Resolution Date: 04/19/2022 Unmet Reason: underlying conditions, Goal Status: Unmet wound improving Ulcer/skin breakdown will have a volume reduction of 80% by week 12 Date Initiated: 02/22/2022 Target Resolution Date: 05/23/2022 Goal Status: Active Ulcer/skin breakdown will heal within 14 weeks Date Initiated: 02/22/2022 Target Resolution Date: 06/23/2022 Goal Status: Active Interventions: Assess patient/caregiver ability to obtain necessary supplies Assess patient/caregiver ability to perform ulcer/skin care regimen upon admission and as needed Assess ulceration(s)  every visit Notes: Electronic Signature(s) Signed: 06/22/2022 4:54:19 PM By: Betha Loa Entered By: Betha Loa on 06/22/2022 16:30:36 -------------------------------------------------------------------------------- Pain Assessment Details Patient Name: Date of Service: [name], [name]. 06/22/2022 3:45 PM Medical Record Number: [id number] Patient Account Number: [id number] Date of Birth/Sex: Treating RN: 1934/09/24 (88 y.o. [name] Primary Care Chanell Nadeau: Marylynn Pearson Other Clinician: Betha Loa Referring Azula Zappia: Treating Julietta Batterman/Extender: Lorna Dibble Weeks in Treatment: 17 Active Problems Location of Pain Severity and Description of Pain Patient Has Paino No Site Locations Rate the pain. Current Pain Level: 0 Pain Management and Medication Current Pain Management: [name], [name] ([id number]) [id number] Page 6 of 9 Electronic Signature(s) Signed: 06/22/2022 4:02:15 PM By: Angelina Pih Entered By: Angelina Pih on 06/22/2022 15:54:47 -------------------------------------------------------------------------------- Patient/Caregiver Education Details Patient Name: Date of Service: [name], [name] 6/18/2024andnbsp3:45 PM Medical Record Number: [id number] Patient Account Number: [id number] Date of Birth/Gender: Treating RN: November 24, 1934 (88 y.o. [name] Primary Care Physician: Marylynn Pearson Other Clinician: Betha Loa Referring Physician: Treating Physician/Extender: Lorna Dibble Weeks in Treatment: 17 Education Assessment Education Provided To: Patient and Caregiver Education Topics Provided Wound/Skin Impairment: Handouts: Other: continue wound care as directed Methods: Explain/Verbal Responses: State content correctly Electronic Signature(s) Signed: 06/22/2022 4:54:19 PM By: Betha Loa Entered By: Betha Loa on 06/22/2022  16:34:00 -------------------------------------------------------------------------------- Wound Assessment Details Patient Name: Date of Service: [name], [name]. 06/22/2022 3:45 PM Medical Record Number: [id number] Patient Account Number: [id number] Date of Birth/Sex: Treating RN: 04-13-1934 (88 y.o. [name] Primary Care Skarlette Lattner: Marylynn Pearson Other Clinician: Betha Loa Referring Daleah Coulson: Treating Absalom Aro/Extender: Lorna Dibble Weeks in Treatment: 17 Wound Status Wound Number: 1 Primary Pressure Ulcer Etiology: Wound Location: Left, Distal, Medial Foot Wound Healed - Epithelialized Wounding Event: Gradually Appeared Status: Date Acquired: 01/22/2022 [name], [name] ([id number]) [id number] Page 7 of 9 Date Acquired: 01/22/2022 Comorbid Chronic Obstructive Pulmonary Disease (COPD), Congestive Weeks Of Treatment: 17 History: Heart Failure, Dementia Clustered Wound: No Photos Wound Measurements Length: (cm) Width: (cm) Depth: (cm) Area: (cm) Volume: (cm) 0 % Reduction in Area: 100% 0 % Reduction in Volume: 100% 0 Epithelialization: Large (67-100%) 0 0 Wound Description Classification: Category/Stage III Exudate Amount: None Present Foul Odor After Cleansing: No Slough/Fibrino No Wound Bed Granulation Amount: None Present (0%) Exposed Structure Necrotic Amount: None Present (0%) Fascia Exposed: No Fat Layer (Subcutaneous Tissue) Exposed: Yes Tendon Exposed: No Muscle Exposed: No Joint Exposed: No Bone Exposed: No Treatment Notes Wound #1 (Foot) Wound Laterality: Left, Medial, Distal Cleanser Peri-Wound Care Topical Primary Dressing Secondary Dressing Secured With Compression Wrap Compression Stockings Add-Ons Electronic Signature(s) Signed: 06/22/2022 4:54:19 PM By: Betha Loa Entered By: Betha Loa on 06/22/2022 16:17:20 Wolfson, [name] ([id number]) [id number]  Page 8 of 9 -------------------------------------------------------------------------------- Wound Assessment Details Patient Name: Date of Service: [name], Saoirse [name]. 06/22/2022 3:45 PM Medical Record Number: [id number] Patient Account Number: [id number] Date of Birth/Sex: Treating  RN: Apr 15, 1934 (88 y.o. [name] Primary Care Ismelda Weatherman: Marylynn Pearson Other Clinician: Betha Loa Referring Pinchas Reither: Treating Denaisha Swango/Extender: Lorna Dibble Weeks in Treatment: 17 Wound Status Wound Number: 3 Primary Pressure Ulcer Etiology: Wound Location: Right T Fourth oe Wound Open Wounding Event: Pressure Injury Status: Date Acquired: 06/15/2022 Comorbid Chronic Obstructive Pulmonary Disease (COPD), Congestive Weeks Of Treatment: 1 History: Heart Failure, Dementia Clustered Wound: No Photos Wound Measurements Length: (cm) 0.1 Width: (cm) 0.1 Depth: (cm) 0.1 Area: (cm) 0.008 Volume: (cm) 0.001 % Reduction in Area: 97.6% % Reduction in Volume: 98.5% Epithelialization: None Wound Description Classification: Category/Stage III Exudate Amount: Medium Exudate Type: Serosanguineous Exudate Color: red, brown Foul Odor After Cleansing: No Slough/Fibrino Yes Treatment Notes Wound #3 (Toe Fourth) Wound Laterality: Right Cleanser Byram Ancillary Kit - 15 Day Supply Discharge Instruction: Use supplies as instructed; Kit contains: (15) Saline Bullets; (15) 3x3 Gauze; 15 pr Gloves Soap and Water Discharge Instruction: Gently cleanse wound with antibacterial soap, rinse and pat dry prior to dressing wounds Peri-Wound Care Topical Primary Dressing Hydrofera Blue Ready Transfer Foam, 2.5x2.5 (in/in) Discharge Instruction: Apply Hydrofera Blue Ready to wound bed as directed Sadlowski, Zafirah [name] ([id number]) [id number] Page 9 of 9 Secondary Dressing Coverlet Latex-Free Fabric Adhesive Dressings Discharge Instruction: Knuckle Secured  With Compression Wrap Compression Stockings Add-Ons Electronic Signature(s) Unsigned Entered By: Angelina Pih on 06/22/2022 16:08:04 -------------------------------------------------------------------------------- Vitals Details Patient Name: Date of Service: [name], Kaleeah [name]. 06/22/2022 3:45 PM Medical Record Number: [id number] Patient Account Number: [id number] Date of Birth/Sex: Treating RN: December 13, 1934 (88 y.o. [name] Primary Care Lamondre Wesche: Marylynn Pearson Other Clinician: Betha Loa Referring Jhoanna Heyde: Treating Dejour Vos/Extender: Lorna Dibble Weeks in Treatment: 17 Vital Signs Time Taken: 15:54 Temperature (F): 98.1 Weight (lbs): 105 Pulse (bpm): 87 Respiratory Rate (breaths/min): 18 Blood Pressure (mmHg): 134/89 Reference Range: 80 - 120 mg / dl Electronic Signature(s) Signed: 06/22/2022 4:02:15 PM By: Angelina Pih Entered By: Angelina Pih on 06/22/2022 15:54:41

## 2022-06-22 NOTE — Progress Notes (Addendum)
[name] ([id number]) [id number] Page 1 of 8 Visit Report for 06/22/2022 Chief Complaint Document Details Patient Name: Date of Service: [name] IS, [name] 06/22/2022 3:45 PM Medical Record Number: [id number] Patient Account Number: [id number] Date of Birth/Sex: Treating RN: [birth year] (88 y.o. [name] Primary Care Provider: Marylynn Pearson Other Clinician: Betha Loa Referring Provider: Treating Provider/Extender: Lorna Dibble Weeks in Treatment: 17 Information Obtained from: Patient Chief Complaint Left foot pressure ulcer and right 4th toe ulcer Electronic Signature(s) Signed: 06/22/2022 3:38:08 PM By: Allen Derry PA-C Entered By: Allen Derry on 06/22/2022 15:38:08 -------------------------------------------------------------------------------- Debridement Details Patient Name: Date of Service: [name], [name] 06/22/2022 3:45 PM Medical Record Number: [id number] Patient Account Number: [id number] Date of Birth/Sex: Treating RN: 07/05/1934 (88 y.o. [name] Primary Care Provider: Marylynn Pearson Other Clinician: Betha Loa Referring Provider: Treating Provider/Extender: Lorna Dibble Weeks in Treatment: 17 Debridement Performed for Assessment: Wound #3 Right T Fourth oe Performed By: Physician Allen Derry, PA-C Debridement Type: Debridement Level of Consciousness (Pre-procedure): Awake and Alert Pre-procedure Verification/Time Out Yes - 16:19 Taken: Start Time: 16:19 Percent of Wound Bed Debrided: 100% T Area Debrided (cm): otal 0.07 Tissue and other material debrided: Non-Viable, Slough, Subcutaneous, Skin: Dermis , Skin: Epidermis, Slough Level: Skin/Subcutaneous Tissue Debridement Description: Excisional Instrument: Curette Bleeding: Minimum Hemostasis Achieved: Pressure Response to Treatment: Procedure was tolerated well Level of Consciousness (Post- Awake and  Alert procedure): Settlemire, Kala L ([id number]) [id number] Page 2 of 8 Post Debridement Measurements of Total Wound Length: (cm) 0.3 Stage: Category/Stage III Width: (cm) 0.3 Depth: (cm) 0.2 Volume: (cm) 0.014 Character of Wound/Ulcer Post Debridement: Stable Post Procedure Diagnosis Same as Pre-procedure Electronic Signature(s) Signed: 06/22/2022 4:54:19 PM By: Betha Loa Signed: 06/24/2022 4:16:40 PM By: Angelina Pih Signed: 06/24/2022 5:52:14 PM By: Allen Derry PA-C Entered By: Betha Loa on 06/22/2022 16:20:22 -------------------------------------------------------------------------------- HPI Details Patient Name: Date of Service: [name], [name] 06/22/2022 3:45 PM Medical Record Number: [id number] Patient Account Number: [id number] Date of Birth/Sex: Treating RN: Oct 01, 1934 (88 y.o. [name] Primary Care Provider: Marylynn Pearson Other Clinician: Betha Loa Referring Provider: Treating Provider/Extender: Lorna Dibble Weeks in Treatment: 17 History of Present Illness HPI Description: 02-22-2022 upon evaluation today patient appears to be doing somewhat poorly in regard to her left medial first metatarsal head where she does have a bunion. Unfortunately this is causing some pressure and has led to a pressure injury at this site. Fortunately I do not see any signs of infection at this point but that is something we definitely can need to keep a close eye on. Fortunately there does not appear to be any signs of active infection locally nor systemically at this point. No fevers, chills, nausea, vomiting, or diarrhea. Patient does have a history of of vascular dementia, COPD, congestive heart failure, and the dementia does affect her ability to be able to in some cases follow directions. She is very nervous about things in general. This is stated to have started somewhere around January 22, 2022. 03-02-2022 upon  evaluation today patient appears to be doing well currently in regard to her wounds all things considered. Fortunately I do not see any signs of infection unfortunately both wounds are still open and the one on the medial foot first metatarsal location is still the worst. Although the area over the fifth toe right foot actually is also open at this point unfortunately. With that being said I do think we can  need to continue to monitor for any signs of worsening or infection although right now I think she is doing okay in that regard. 3/4; this is a patient with a bunion wound on the left medial foot. She also had a small blister on the right lateral fifth toe. She went for x-rays last week no acute fracture or dislocation was seen on the left side she had worsened moderate hallucis valgus and moderate second and third fourth fifth tarsometatarsal osteoarthritis. We use Prisma. 3/11; patient presents for follow-up. She has been using endoform to the left medial foot wound. She has cushion pads that she uses in between the toes to help offload the areas and prevent future wounds. The right lateral fifth met head remains closed. 03-22-2022 upon evaluation today patient appears to be doing well currently in regard to her wound. She does show some signs of improvement here. She is also showing some signs of being a little bit too moist I think we may want to cut back on the hydrogel possibly just using the saline only at this point she is in agreement with that plan. Fortunately I do not see any evidence of active infection locally nor systemically which is great news. No fevers, chills, nausea, vomiting, or diarrhea. 03-29-22 upon evaluation today patient actually appears to be making excellent progress and very pleased with how things appear and how the wound is doing. I do think that we are moving in the right direction and very pleased in that regard. No fevers, chills, nausea, vomiting, or  diarrhea. 04-05-2022 upon evaluation today patient appears to be doing well currently in regard to her wound. This is actually showing signs of improvement. Fortunately there does not appear to be any signs of active infection locally nor systemically at this time which is great news. No fevers, chills, nausea, vomiting, or diarrhea. 04-12-2022 upon evaluation today patient's wound actually showing signs of some improvement. Fortunately there does not appear to be any evidence of active infection locally nor systemically which is great news. No fevers, chills, nausea, vomiting, or diarrhea. 04-19-2022 upon evaluation today patient actually appears to be doing quite well in regard to her wound. She has been tolerating the dressing changes without complication. Fortunately there does not appear to be any signs of active infection locally or systemically which is great news. No fevers, chills, nausea, vomiting, or diarrhea. 05-03-2022 upon evaluation today patient appears to be doing better in regard to her foot ulcer this is actually very close to complete resolution. Fortunately I JYL, CHICO ([id number]) [id number] Page 3 of 8 do not see any signs of active infection locally nor systemically. 05-10-2022 upon evaluation today patient appears to be doing well currently in regard to her wound there is some need for sharp debridement but minimal. 05-17-2022 upon evaluation today patient appears to be doing well currently in regard to her foot ulcer. This is still continue to make good progress. Fortunately there does not appear to be any signs of active infection locally nor systemically 05-24-2022 upon patient today patient actually appears to be doing to her wound. This seems to be making some slow I am extremely pleased with where we stand currently. I do not see any signs of infection locally nor systemically which is great news. 06-01-2022 upon evaluation today patient's wound  actually appears to be doing excellent. I do not see any signs of active infection at this time which is great news and overall I think she is actually  very close to complete resolution. I am extremely pleased with where things stand today. In general I think that were moving appropriately towards closure. 06-07-2022 upon evaluation today patient appears to be doing well currently in regard to her wound. This is still a little bit open we were hoping that it might be closed but again this does not appear to be the case. Fortunately I do not see any signs of worsening I think she is doing well in general however 06-15-2022 upon evaluation today patient appears to be doing better in regard to the wound on her left foot although she does have a new wound on her right foot at her toe and this is a pressure ulcer at this time location. With that being said I do not see any signs of infection here obvious at the moment but again this does appear to be somewhat painful for her and again this is new even compared to last week when I last saw her. The left foot seems to be doing much better. 06-25-2022 patient's left flank still continues to show signs of improvement. Fortunately I do not see anything open. The fourth toe right foot actually showed signs of some issues here although it looks better than last week I am actually very pleased with where things stand. Electronic Signature(s) Signed: 06/25/2022 7:47:54 AM By: Allen Derry PA-C Entered By: Allen Derry on 06/25/2022 07:47:54 -------------------------------------------------------------------------------- Physical Exam Details Patient Name: Date of Service: [name], Murielle L. 06/22/2022 3:45 PM Medical Record Number: [id number] Patient Account Number: [id number] Date of Birth/Sex: Treating RN: 1934-04-19 (88 y.o. [name] Primary Care Provider: Marylynn Pearson Other Clinician: Betha Loa Referring Provider: Treating Provider/Extender:  Lorna Dibble Weeks in Treatment: 17 Constitutional Well-nourished and well-hydrated in no acute distress. Respiratory normal breathing without difficulty. Psychiatric this patient is able to make decisions and demonstrates good insight into disease process. Alert and Oriented x 3. pleasant and cooperative. Notes Patient's wound bed actually showed signs of good granulation epithelization at this point. Fortunately I do not see any signs of infection locally or systemically which is great news and in general I do believe that we are making good progress here. Electronic Signature(s) Signed: 06/25/2022 7:48:09 AM By: Allen Derry PA-C Entered By: Allen Derry on 06/25/2022 07:48:09 Hettich, Malary L ([id number]) [id number] Page 4 of 8 -------------------------------------------------------------------------------- Physician Orders Details Patient Name: Date of Service: [name], Arayna L. 06/22/2022 3:45 PM Medical Record Number: [id number] Patient Account Number: [id number] Date of Birth/Sex: Treating RN: [birth year] (88 y.o. [name] Primary Care Provider: Marylynn Pearson Other Clinician: Betha Loa Referring Provider: Treating Provider/Extender: Lorna Dibble Weeks in Treatment: 17 Verbal / Phone Orders: Yes Clinician: Angelina Pih Read Back and Verified: Yes Diagnosis Coding ICD-10 Coding Code Description [phone] Pressure ulcer of other site, stage 3 L97.512 Non-pressure chronic ulcer of other part of right foot with fat layer exposed I50.42 Chronic combined systolic (congestive) and diastolic (congestive) heart failure F01.511 Vascular dementia, unspecified severity, with agitation J44.9 Chronic obstructive pulmonary disease, unspecified Follow-up Appointments Return Appointment in 1 week. Bathing/ [employer] wounds with antibacterial soap and water. Anesthetic (Use 'Patient Medications' Section  for Anesthetic Order Entry) Lidocaine applied to wound bed Edema Control - Lymphedema / Segmental Compressive Device / Other Elevate, Exercise Daily and A void Standing for Long Periods of Time. Elevate legs to the level of the heart and pump ankles as often as possible Elevate leg(s) parallel to the floor  when sitting. Wound Treatment Wound #3 - T Fourth oe Wound Laterality: Right Cleanser: Byram Ancillary Kit - 15 Day Supply (Generic) 3 x Per Week/30 Days Discharge Instructions: Use supplies as instructed; Kit contains: (15) Saline Bullets; (15) 3x3 Gauze; 15 pr Gloves Cleanser: Soap and Water 3 x Per Week/30 Days Discharge Instructions: Gently cleanse wound with antibacterial soap, rinse and pat dry prior to dressing wounds Prim Dressing: Hydrofera Blue Ready Transfer Foam, 2.5x2.5 (in/in) 3 x Per Week/30 Days ary Discharge Instructions: Apply Hydrofera Blue Ready to wound bed as directed Secondary Dressing: Coverlet Latex-Free Fabric Adhesive Dressings 3 x Per Week/30 Days Discharge Instructions: Knuckle Electronic Signature(s) Signed: 06/22/2022 4:54:19 PM By: Betha Loa Signed: 06/24/2022 5:52:14 PM By: Allen Derry PA-C Entered By: Betha Loa on 06/22/2022 16:30:20 Friesenhahn, Kia L ([id number]) [id number] Page 5 of 8 -------------------------------------------------------------------------------- Problem List Details Patient Name: Date of Service: [name], Misheel L. 06/22/2022 3:45 PM Medical Record Number: [id number] Patient Account Number: [id number] Date of Birth/Sex: Treating RN: 24-Apr-1934 (88 y.o. [name] Primary Care Provider: Marylynn Pearson Other Clinician: Betha Loa Referring Provider: Treating Provider/Extender: Lorna Dibble Weeks in Treatment: 17 Active Problems ICD-10 Encounter Code Description Active Date MDM Diagnosis L89.893 Pressure ulcer of other site, stage 3 02/22/2022 No Yes L97.512  Non-pressure chronic ulcer of other part of right foot with fat layer exposed 06/15/2022 No Yes I50.42 Chronic combined systolic (congestive) and diastolic (congestive) heart failure 02/22/2022 No Yes F01.511 Vascular dementia, unspecified severity, with agitation 02/22/2022 No Yes J44.9 Chronic obstructive pulmonary disease, unspecified 02/22/2022 No Yes Inactive Problems Resolved Problems Electronic Signature(s) Signed: 06/22/2022 3:38:02 PM By: Allen Derry PA-C Entered By: Allen Derry on 06/22/2022 15:38:01 -------------------------------------------------------------------------------- Progress Note Details Patient Name: Date of Service: [name], Rut L. 06/22/2022 3:45 PM Medical Record Number: [id number] Patient Account Number: [id number] Date of Birth/Sex: Treating RN: Aug 23, 1934 (88 y.o. [name] Primary Care Provider: Marylynn Pearson Other Clinician: Kataleyah, Carducci ([id number]) [phone].pdf Page 6 of 8 Referring Provider: Treating Provider/Extender: Lorna Dibble Weeks in Treatment: 17 Subjective Chief Complaint Information obtained from Patient Left foot pressure ulcer and right 4th toe ulcer History of Present Illness (HPI) 02-22-2022 upon evaluation today patient appears to be doing somewhat poorly in regard to her left medial first metatarsal head where she does have a bunion. Unfortunately this is causing some pressure and has led to a pressure injury at this site. Fortunately I do not see any signs of infection at this point but that is something we definitely can need to keep a close eye on. Fortunately there does not appear to be any signs of active infection locally nor systemically at this point. No fevers, chills, nausea, vomiting, or diarrhea. Patient does have a history of of vascular dementia, COPD, congestive heart failure, and the dementia does affect her ability to be able to in some cases follow  directions. She is very nervous about things in general. This is stated to have started somewhere around January 22, 2022. 03-02-2022 upon evaluation today patient appears to be doing well currently in regard to her wounds all things considered. Fortunately I do not see any signs of infection unfortunately both wounds are still open and the one on the medial foot first metatarsal location is still the worst. Although the area over the fifth toe right foot actually is also open at this point unfortunately. With that being said I do think we can need to continue to monitor  for any signs of worsening or infection although right now I think she is doing okay in that regard. 3/4; this is a patient with a bunion wound on the left medial foot. She also had a small blister on the right lateral fifth toe. She went for x-rays last week no acute fracture or dislocation was seen on the left side she had worsened moderate hallucis valgus and moderate second and third fourth fifth tarsometatarsal osteoarthritis. We use Prisma. 3/11; patient presents for follow-up. She has been using endoform to the left medial foot wound. She has cushion pads that she uses in between the toes to help offload the areas and prevent future wounds. The right lateral fifth met head remains closed. 03-22-2022 upon evaluation today patient appears to be doing well currently in regard to her wound. She does show some signs of improvement here. She is also showing some signs of being a little bit too moist I think we may want to cut back on the hydrogel possibly just using the saline only at this point she is in agreement with that plan. Fortunately I do not see any evidence of active infection locally nor systemically which is great news. No fevers, chills, nausea, vomiting, or diarrhea. 03-29-22 upon evaluation today patient actually appears to be making excellent progress and very pleased with how things appear and how the wound is doing.  I do think that we are moving in the right direction and very pleased in that regard. No fevers, chills, nausea, vomiting, or diarrhea. 04-05-2022 upon evaluation today patient appears to be doing well currently in regard to her wound. This is actually showing signs of improvement. Fortunately there does not appear to be any signs of active infection locally nor systemically at this time which is great news. No fevers, chills, nausea, vomiting, or diarrhea. 04-12-2022 upon evaluation today patient's wound actually showing signs of some improvement. Fortunately there does not appear to be any evidence of active infection locally nor systemically which is great news. No fevers, chills, nausea, vomiting, or diarrhea. 04-19-2022 upon evaluation today patient actually appears to be doing quite well in regard to her wound. She has been tolerating the dressing changes without complication. Fortunately there does not appear to be any signs of active infection locally or systemically which is great news. No fevers, chills, nausea, vomiting, or diarrhea. 05-03-2022 upon evaluation today patient appears to be doing better in regard to her foot ulcer this is actually very close to complete resolution. Fortunately I do not see any signs of active infection locally nor systemically. 05-10-2022 upon evaluation today patient appears to be doing well currently in regard to her wound there is some need for sharp debridement but minimal. 05-17-2022 upon evaluation today patient appears to be doing well currently in regard to her foot ulcer. This is still continue to make good progress. Fortunately there does not appear to be any signs of active infection locally nor systemically 05-24-2022 upon patient today patient actually appears to be doing to her wound. This seems to be making some slow I am extremely pleased with where we stand currently. I do not see any signs of infection locally nor systemically which is great  news. 06-01-2022 upon evaluation today patient's wound actually appears to be doing excellent. I do not see any signs of active infection at this time which is great news and overall I think she is actually very close to complete resolution. I am extremely pleased with where things stand today.  In general I think that were moving appropriately towards closure. 06-07-2022 upon evaluation today patient appears to be doing well currently in regard to her wound. This is still a little bit open we were hoping that it might be closed but again this does not appear to be the case. Fortunately I do not see any signs of worsening I think she is doing well in general however 06-15-2022 upon evaluation today patient appears to be doing better in regard to the wound on her left foot although she does have a new wound on her right foot at her toe and this is a pressure ulcer at this time location. With that being said I do not see any signs of infection here obvious at the moment but again this does appear to be somewhat painful for her and again this is new even compared to last week when I last saw her. The left foot seems to be doing much better. 06-25-2022 patient's left flank still continues to show signs of improvement. Fortunately I do not see anything open. The fourth toe right foot actually showed signs of some issues here although it looks better than last week I am actually very pleased with where things stand. Objective Constitutional Well-nourished and well-hydrated in no acute distress. Vitals Time Taken: 3:54 PM, Weight: 105 lbs, Temperature: 98.1 F, Pulse: 87 bpm, Respiratory Rate: 18 breaths/min, Blood Pressure: 134/89 mmHg. KRISTIEN, SALATINO ([id number]) [id number] Page 7 of 8 Respiratory normal breathing without difficulty. Psychiatric this patient is able to make decisions and demonstrates good insight into disease process. Alert and Oriented x 3. pleasant and  cooperative. General Notes: Patient's wound bed actually showed signs of good granulation epithelization at this point. Fortunately I do not see any signs of infection locally or systemically which is great news and in general I do believe that we are making good progress here. Integumentary (Hair, Skin) Wound #1 status is Healed - Epithelialized. Original cause of wound was Gradually Appeared. The date acquired was: 01/22/2022. The wound has been in treatment 17 weeks. The wound is located on the Left,Distal,Medial Foot. The wound measures 0cm length x 0cm width x 0cm depth; 0cm^2 area and 0cm^3 volume. There is Fat Layer (Subcutaneous Tissue) exposed. There is a none present amount of drainage noted. There is no granulation within the wound bed. There is no necrotic tissue within the wound bed. Wound #3 status is Open. Original cause of wound was Pressure Injury. The date acquired was: 06/15/2022. The wound has been in treatment 1 weeks. The wound is located on the Right T Fourth. The wound measures 0.1cm length x 0.1cm width x 0.1cm depth; 0.008cm^2 area and 0.001cm^3 volume. There is a oe medium amount of serosanguineous drainage noted. Assessment Active Problems ICD-10 Pressure ulcer of other site, stage 3 Non-pressure chronic ulcer of other part of right foot with fat layer exposed Chronic combined systolic (congestive) and diastolic (congestive) heart failure Vascular dementia, unspecified severity, with agitation Chronic obstructive pulmonary disease, unspecified Procedures Wound #3 Pre-procedure diagnosis of Wound #3 is a Pressure Ulcer located on the Right T Fourth . There was a Excisional Skin/Subcutaneous Tissue Debridement with oe a total area of 0.07 sq cm performed by Allen Derry, PA-C. With the following instrument(s): Curette to remove Non-Viable tissue/material. Material removed includes Subcutaneous Tissue, Slough, Skin: Dermis, and Skin: Epidermis. A time out was conducted  at 16:19, prior to the start of the procedure. A Minimum amount of bleeding was controlled with Pressure. The procedure  was tolerated well. Post Debridement Measurements: 0.3cm length x 0.3cm width x 0.2cm depth; 0.014cm^3 volume. Post debridement Stage noted as Category/Stage III. Character of Wound/Ulcer Post Debridement is stable. Post procedure Diagnosis Wound #3: Same as Pre-Procedure Plan Follow-up Appointments: Return Appointment in 1 week. Bathing/ Shower/ Hygiene: Wash wounds with antibacterial soap and water. Anesthetic (Use 'Patient Medications' Section for Anesthetic Order Entry): Lidocaine applied to wound bed Edema Control - Lymphedema / Segmental Compressive Device / Other: Elevate, Exercise Daily and Avoid Standing for Long Periods of Time. Elevate legs to the level of the heart and pump ankles as often as possible Elevate leg(s) parallel to the floor when sitting. WOUND #3: - T Fourth Wound Laterality: Right oe Cleanser: [employer]ram Ancillary Kit - 15 Day Supply (Generic) 3 x Per Week/30 Days Discharge Instructions: Use supplies as instructed; Kit contains: (15) Saline Bullets; (15) 3x3 Gauze; 15 pr Gloves Cleanser: Soap and Water 3 x Per Week/30 Days Discharge Instructions: Gently cleanse wound with antibacterial soap, rinse and pat dry prior to dressing wounds Prim Dressing: Hydrofera Blue Ready Transfer Foam, 2.5x2.5 (in/in) 3 x Per Week/30 Days ary Discharge Instructions: Apply Hydrofera Blue Ready to wound bed as directed Secondary Dressing: Coverlet Latex-Free Fabric Adhesive Dressings 3 x Per Week/30 Days Discharge Instructions: Knuckle 1. I am good recommend that we have the patient continue to monitor for any signs of infection or worsening. Based on what I am seeing I do believe that we are making good headway towards closure and very pleased with this. With that being said she does still have an area on the right fourth toe we will get a monitor the left foot seems  healed. 2. We will continue with the [facility] and a coverlet I think the simplest way to go. We will see patient back for reevaluation in 1 week here in the clinic. If anything worsens or changes patient will contact our office for additional recommendations. XAVIER, FOURNIER ([id number]) [id number] Page 8 of 8 Electronic Signature(s) Signed: 06/25/2022 7:48:56 AM By: Allen Derry PA-C Entered By: Allen Derry on 06/25/2022 07:48:55

## 2022-06-29 ENCOUNTER — Encounter: Payer: Medicare Other | Admitting: Physician Assistant

## 2022-06-29 DIAGNOSIS — L89893 Pressure ulcer of other site, stage 3: Secondary | ICD-10-CM | POA: Diagnosis not present

## 2022-06-29 NOTE — Progress Notes (Signed)
SHANEISHA, BURKEL ([id number]) [id number] Page 1 of 8 Visit Report for 06/29/2022 Chief Complaint Document Details Patient Name: Date of Service: [name], [name] 06/29/2022 3:45 PM Medical Record Number: [id number] Patient Account Number: [id number] Date of Birth/Sex: Treating RN: Feb 03, 1934 (88 y.o. [name] Primary Care Provider: Marylynn Pearson Other Clinician: Betha Loa Referring Provider: Treating Provider/Extender: Lorna Dibble Weeks in Treatment: 18 Information Obtained from: Patient Chief Complaint Left foot pressure ulcer and right 4th toe ulcer Electronic Signature(s) Signed: 06/29/2022 4:01:34 PM By: Allen Derry PA-C Entered By: Allen Derry on 06/29/2022 16:01:33 -------------------------------------------------------------------------------- Debridement Details Patient Name: Date of Service: [name], [name]. 06/29/2022 3:45 PM Medical Record Number: [id number] Patient Account Number: [id number] Date of Birth/Sex: Treating RN: May 24, 1934 (88 y.o. [name] Primary Care Provider: Marylynn Pearson Other Clinician: Betha Loa Referring Provider: Treating Provider/Extender: Lorna Dibble Weeks in Treatment: 18 Debridement Performed for Assessment: Wound #3 Right T Fourth oe Performed By: Physician Allen Derry, PA-C Debridement Type: Debridement Level of Consciousness (Pre-procedure): Awake and Alert Pre-procedure Verification/Time Out Yes - 16:05 Taken: Start Time: 16:05 Percent of Wound Bed Debrided: 100% T Area Debrided (cm): otal 0.16 Tissue and other material debrided: Viable, Non-Viable, Callus, Slough, Subcutaneous, Slough Level: Skin/Subcutaneous Tissue Debridement Description: Excisional Instrument: Curette Bleeding: Minimum Hemostasis Achieved: Pressure Response to Treatment: Procedure was tolerated well Level of Consciousness (Post- Awake and Alert procedure): [name], [name]  ([id number]) [id number] Page 2 of 8 Post Debridement Measurements of Total Wound Length: (cm) 0.5 Stage: Category/Stage III Width: (cm) 0.4 Depth: (cm) 0.2 Volume: (cm) 0.031 Character of Wound/Ulcer Post Debridement: Stable Post Procedure Diagnosis Same as Pre-procedure Electronic Signature(s) Signed: 06/29/2022 4:35:57 PM By: Midge Aver MSN RN CNS WTA Signed: 06/29/2022 5:11:00 PM By: Allen Derry PA-C Signed: 06/30/2022 5:16:31 PM By: Betha Loa Entered By: Betha Loa on 06/29/2022 16:06:08 -------------------------------------------------------------------------------- HPI Details Patient Name: Date of Service: [name], [name]. 06/29/2022 3:45 PM Medical Record Number: [id number] Patient Account Number: [id number] Date of Birth/Sex: Treating RN: 16-Apr-1934 (88 y.o. [name] Primary Care Provider: Marylynn Pearson Other Clinician: Betha Loa Referring Provider: Treating Provider/Extender: Lorna Dibble Weeks in Treatment: 18 History of Present Illness HPI Description: 02-22-2022 upon evaluation today patient appears to be doing somewhat poorly in regard to her left medial first metatarsal head where she does have a bunion. Unfortunately this [name] causing some pressure and has led to a pressure injury at this site. Fortunately I do not see any signs of infection at this point but that [name] something we definitely can need to keep a close eye on. Fortunately there does not appear to be any signs of active infection locally nor systemically at this point. No fevers, chills, nausea, vomiting, or diarrhea. Patient does have a history of of vascular dementia, COPD, congestive heart failure, and the dementia does affect her ability to be able to in some cases follow directions. She [name] very nervous about things in general. This [name] stated to have started somewhere around January 22, 2022. 03-02-2022 upon evaluation today patient appears  to be doing well currently in regard to her wounds all things considered. Fortunately I do not see any signs of infection unfortunately both wounds are still open and the one on the medial foot first metatarsal location [name] still the worst. Although the area over the fifth toe right foot actually [name] also open at this point unfortunately. With that being said I do think we  can need to continue to monitor for any signs of worsening or infection although right now I think she [name] doing okay in that regard. 3/4; this [name] a patient with a bunion wound on the left medial foot. She also had a small blister on the right lateral fifth toe. She went for x-rays last week no acute fracture or dislocation was seen on the left side she had worsened moderate hallucis valgus and moderate second and third fourth fifth tarsometatarsal osteoarthritis. We use Prisma. 3/11; patient presents for follow-up. She has been using endoform to the left medial foot wound. She has cushion pads that she uses in between the toes to help offload the areas and prevent future wounds. The right lateral fifth met head remains closed. 03-22-2022 upon evaluation today patient appears to be doing well currently in regard to her wound. She does show some signs of improvement here. She [name] also showing some signs of being a little bit too moist I think we may want to cut back on the hydrogel possibly just using the saline only at this point she [name] in agreement with that plan. Fortunately I do not see any evidence of active infection locally nor systemically which [name] great news. No fevers, chills, nausea, vomiting, or diarrhea. 03-29-22 upon evaluation today patient actually appears to be making excellent progress and very pleased with how things appear and how the wound [name] doing. I do think that we are moving in the right direction and very pleased in that regard. No fevers, chills, nausea, vomiting, or diarrhea. 04-05-2022 upon evaluation today  patient appears to be doing well currently in regard to her wound. This [name] actually showing signs of improvement. Fortunately there does not appear to be any signs of active infection locally nor systemically at this time which [name] great news. No fevers, chills, nausea, vomiting, or diarrhea. 04-12-2022 upon evaluation today patient's wound actually showing signs of some improvement. Fortunately there does not appear to be any evidence of active infection locally nor systemically which [name] great news. No fevers, chills, nausea, vomiting, or diarrhea. 04-19-2022 upon evaluation today patient actually appears to be doing quite well in regard to her wound. She has been tolerating the dressing changes without complication. Fortunately there does not appear to be any signs of active infection locally or systemically which [name] great news. No fevers, chills, nausea, vomiting, or diarrhea. 05-03-2022 upon evaluation today patient appears to be doing better in regard to her foot ulcer this [name] actually very close to complete resolution. Fortunately I [name], [name] ([id number]) [id number] Page 3 of 8 do not see any signs of active infection locally nor systemically. 05-10-2022 upon evaluation today patient appears to be doing well currently in regard to her wound there [name] some need for sharp debridement but minimal. 05-17-2022 upon evaluation today patient appears to be doing well currently in regard to her foot ulcer. This [name] still continue to make good progress. Fortunately there does not appear to be any signs of active infection locally nor systemically 05-24-2022 upon patient today patient actually appears to be doing to her wound. This seems to be making some slow I am extremely pleased with where we stand currently. I do not see any signs of infection locally nor systemically which [name] great news. 06-01-2022 upon evaluation today patient's wound actually appears to be doing excellent. I  do not see any signs of active infection at this time which [name] great news and overall I think she [name]  actually very close to complete resolution. I am extremely pleased with where things stand today. In general I think that were moving appropriately towards closure. 06-07-2022 upon evaluation today patient appears to be doing well currently in regard to her wound. This [name] still a little bit open we were hoping that it might be closed but again this does not appear to be the case. Fortunately I do not see any signs of worsening I think she [name] doing well in general however 06-15-2022 upon evaluation today patient appears to be doing better in regard to the wound on her left foot although she does have a new wound on her right foot at her toe and this [name] a pressure ulcer at this time location. With that being said I do not see any signs of infection here obvious at the moment but again this does appear to be somewhat painful for her and again this [name] new even compared to last week when I last saw her. The left foot seems to be doing much better. 06-25-2022 patient's left flank still continues to show signs of improvement. Fortunately I do not see anything open. The fourth toe right foot actually showed signs of some issues here although it looks better than last week I am actually very pleased with where things stand. 06-29-2022 upon evaluation today patient appears to be doing well currently in regard to her left foot which [name] still healed. In regard to the right foot we are still continuing to utilize the [facility] at this point. With that being said I do believe that the patient [name] going to need to switch to something else. I would recommend that we attempt to do the endoform which [name] done well for the left foot. Electronic Signature(s) Signed: 06/29/2022 4:17:09 PM By: Allen Derry PA-C Entered By: Allen Derry on 06/29/2022  16:17:09 -------------------------------------------------------------------------------- Physical Exam Details Patient Name: Date of Service: [name], [name]. 06/29/2022 3:45 PM Medical Record Number: [id number] Patient Account Number: [id number] Date of Birth/Sex: Treating RN: 22-Apr-1934 (88 y.o. [name] Primary Care Provider: Marylynn Pearson Other Clinician: Betha Loa Referring Provider: Treating Provider/Extender: Lorna Dibble Weeks in Treatment: 1 Constitutional Well-nourished and well-hydrated in no acute distress. Respiratory normal breathing without difficulty. Psychiatric this patient [name] able to make decisions and demonstrates good insight into disease process. Alert and Oriented x 3. pleasant and cooperative. Notes Upon inspection patient's wound bed actually showed signs of pretty good granulation epithelization at this point. Fortunately I do not see any signs of infection I did perform debridement clearway some necrotic debris patient tolerated this today without complication postdebridement this [name] much better we are going to attempt endoform over the next few weeks and see how things go. Electronic Signature(s) Signed: 06/29/2022 4:17:30 PM By: Allen Derry PA-C Entered By: Allen Derry on 06/29/2022 16:17:30 Adell, Adrianna [name] ([id number]) [id number] Page 4 of 8 -------------------------------------------------------------------------------- Physician Orders Details Patient Name: Date of Service: [name], [name]. 06/29/2022 3:45 PM Medical Record Number: [id number] Patient Account Number: [id number] Date of Birth/Sex: Treating RN: 1934/09/25 (88 y.o. [name] Primary Care Provider: Marylynn Pearson Other Clinician: Betha Loa Referring Provider: Treating Provider/Extender: Lorna Dibble Weeks in Treatment: 55 Verbal / Phone Orders: Yes Clinician: Midge Aver Read Back and Verified:  Yes Diagnosis Coding ICD-10 Coding Code Description [phone] Pressure ulcer of other site, stage 3 L97.512 Non-pressure chronic ulcer of other part of right foot with fat layer exposed I50.42 Chronic  combined systolic (congestive) and diastolic (congestive) heart failure F01.511 Vascular dementia, unspecified severity, with agitation J44.9 Chronic obstructive pulmonary disease, unspecified Follow-up Appointments Return Appointment in 1 week. Bathing/ [employer] wounds with antibacterial soap and water. Anesthetic (Use 'Patient Medications' Section for Anesthetic Order Entry) Lidocaine applied to wound bed Edema Control - Lymphedema / Segmental Compressive Device / Other Elevate, Exercise Daily and A void Standing for Long Periods of Time. Elevate legs to the level of the heart and pump ankles as often as possible Elevate leg(s) parallel to the floor when sitting. Wound Treatment Wound #3 - T Fourth oe Wound Laterality: Right Cleanser: [employer] Ancillary Kit - 15 Day Supply (Generic) 3 x Per Week/30 Days Discharge Instructions: Use supplies as instructed; Kit contains: (15) Saline Bullets; (15) 3x3 Gauze; 15 pr Gloves Cleanser: Soap and Water 3 x Per Week/30 Days Discharge Instructions: Gently cleanse wound with antibacterial soap, rinse and pat dry prior to dressing wounds Prim Dressing: Endoform Natural, Non-fenestrated, 2x2 (in/in) ary 3 x Per Week/30 Days Secondary Dressing: Coverlet Latex-Free Fabric Adhesive Dressings 3 x Per Week/30 Days Discharge Instructions: Knuckle Electronic Signature(s) Signed: 06/29/2022 5:11:00 PM By: Allen Derry PA-C Signed: 06/30/2022 5:16:31 PM By: Betha Loa Entered By: Betha Loa on 06/29/2022 16:07:22 [name], [name] ([id number]) [id number] Page 5 of 8 -------------------------------------------------------------------------------- Problem List Details Patient Name: Date of Service: [name], [name].  06/29/2022 3:45 PM Medical Record Number: [id number] Patient Account Number: [id number] Date of Birth/Sex: Treating RN: 02-May-1934 (88 y.o. [name] Primary Care Provider: Marylynn Pearson Other Clinician: Betha Loa Referring Provider: Treating Provider/Extender: Lorna Dibble Weeks in Treatment: 18 Active Problems ICD-10 Encounter Code Description Active Date MDM Diagnosis L89.893 Pressure ulcer of other site, stage 3 02/22/2022 No Yes L97.512 Non-pressure chronic ulcer of other part of right foot with fat layer exposed 06/15/2022 No Yes I50.42 Chronic combined systolic (congestive) and diastolic (congestive) heart failure 02/22/2022 No Yes F01.511 Vascular dementia, unspecified severity, with agitation 02/22/2022 No Yes J44.9 Chronic obstructive pulmonary disease, unspecified 02/22/2022 No Yes Inactive Problems Resolved Problems Electronic Signature(s) Signed: 06/29/2022 4:01:31 PM By: Allen Derry PA-C Entered By: Allen Derry on 06/29/2022 16:01:30 -------------------------------------------------------------------------------- Progress Note Details Patient Name: Date of Service: [name], Alajia [name]. 06/29/2022 3:45 PM Medical Record Number: [id number] Patient Account Number: [id number] Date of Birth/Sex: Treating RN: 03-17-1934 (88 y.o. [name] Primary Care Provider: Marylynn Pearson Other Clinician: Zalma, Channing ([id number]) [phone].pdf Page 6 of 8 Referring Provider: Treating Provider/Extender: Lorna Dibble Weeks in Treatment: 18 Subjective Chief Complaint Information obtained from Patient Left foot pressure ulcer and right 4th toe ulcer History of Present Illness (HPI) 02-22-2022 upon evaluation today patient appears to be doing somewhat poorly in regard to her left medial first metatarsal head where she does have a bunion. Unfortunately this [name] causing some pressure and has led to a  pressure injury at this site. Fortunately I do not see any signs of infection at this point but that [name] something we definitely can need to keep a close eye on. Fortunately there does not appear to be any signs of active infection locally nor systemically at this point. No fevers, chills, nausea, vomiting, or diarrhea. Patient does have a history of of vascular dementia, COPD, congestive heart failure, and the dementia does affect her ability to be able to in some cases follow directions. She [name] very nervous about things in general. This [name] stated to have started somewhere around  January 22, 2022. 03-02-2022 upon evaluation today patient appears to be doing well currently in regard to her wounds all things considered. Fortunately I do not see any signs of infection unfortunately both wounds are still open and the one on the medial foot first metatarsal location [name] still the worst. Although the area over the fifth toe right foot actually [name] also open at this point unfortunately. With that being said I do think we can need to continue to monitor for any signs of worsening or infection although right now I think she [name] doing okay in that regard. 3/4; this [name] a patient with a bunion wound on the left medial foot. She also had a small blister on the right lateral fifth toe. She went for x-rays last week no acute fracture or dislocation was seen on the left side she had worsened moderate hallucis valgus and moderate second and third fourth fifth tarsometatarsal osteoarthritis. We use Prisma. 3/11; patient presents for follow-up. She has been using endoform to the left medial foot wound. She has cushion pads that she uses in between the toes to help offload the areas and prevent future wounds. The right lateral fifth met head remains closed. 03-22-2022 upon evaluation today patient appears to be doing well currently in regard to her wound. She does show some signs of improvement here. She [name] also showing some  signs of being a little bit too moist I think we may want to cut back on the hydrogel possibly just using the saline only at this point she [name] in agreement with that plan. Fortunately I do not see any evidence of active infection locally nor systemically which [name] great news. No fevers, chills, nausea, vomiting, or diarrhea. 03-29-22 upon evaluation today patient actually appears to be making excellent progress and very pleased with how things appear and how the wound [name] doing. I do think that we are moving in the right direction and very pleased in that regard. No fevers, chills, nausea, vomiting, or diarrhea. 04-05-2022 upon evaluation today patient appears to be doing well currently in regard to her wound. This [name] actually showing signs of improvement. Fortunately there does not appear to be any signs of active infection locally nor systemically at this time which [name] great news. No fevers, chills, nausea, vomiting, or diarrhea. 04-12-2022 upon evaluation today patient's wound actually showing signs of some improvement. Fortunately there does not appear to be any evidence of active infection locally nor systemically which [name] great news. No fevers, chills, nausea, vomiting, or diarrhea. 04-19-2022 upon evaluation today patient actually appears to be doing quite well in regard to her wound. She has been tolerating the dressing changes without complication. Fortunately there does not appear to be any signs of active infection locally or systemically which [name] great news. No fevers, chills, nausea, vomiting, or diarrhea. 05-03-2022 upon evaluation today patient appears to be doing better in regard to her foot ulcer this [name] actually very close to complete resolution. Fortunately I do not see any signs of active infection locally nor systemically. 05-10-2022 upon evaluation today patient appears to be doing well currently in regard to her wound there [name] some need for sharp debridement but minimal. 05-17-2022 upon  evaluation today patient appears to be doing well currently in regard to her foot ulcer. This [name] still continue to make good progress. Fortunately there does not appear to be any signs of active infection locally nor systemically 05-24-2022 upon patient today patient actually appears to be doing to  her wound. This seems to be making some slow I am extremely pleased with where we stand currently. I do not see any signs of infection locally nor systemically which [name] great news. 06-01-2022 upon evaluation today patient's wound actually appears to be doing excellent. I do not see any signs of active infection at this time which [name] great news and overall I think she [name] actually very close to complete resolution. I am extremely pleased with where things stand today. In general I think that were moving appropriately towards closure. 06-07-2022 upon evaluation today patient appears to be doing well currently in regard to her wound. This [name] still a little bit open we were hoping that it might be closed but again this does not appear to be the case. Fortunately I do not see any signs of worsening I think she [name] doing well in general however 06-15-2022 upon evaluation today patient appears to be doing better in regard to the wound on her left foot although she does have a new wound on her right foot at her toe and this [name] a pressure ulcer at this time location. With that being said I do not see any signs of infection here obvious at the moment but again this does appear to be somewhat painful for her and again this [name] new even compared to last week when I last saw her. The left foot seems to be doing much better. 06-25-2022 patient's left flank still continues to show signs of improvement. Fortunately I do not see anything open. The fourth toe right foot actually showed signs of some issues here although it looks better than last week I am actually very pleased with where things stand. 06-29-2022 upon evaluation today  patient appears to be doing well currently in regard to her left foot which [name] still healed. In regard to the right foot we are still continuing to utilize the [facility] at this point. With that being said I do believe that the patient [name] going to need to switch to something else. I would recommend that we attempt to do the endoform which [name] done well for the left foot. Objective [name], [name] ([id number]) [id number] Page 7 of 8 Constitutional Well-nourished and well-hydrated in no acute distress. Vitals Time Taken: 3:40 PM, Weight: 105 lbs, Temperature: 98.0 F, Pulse: 85 bpm, Respiratory Rate: 16 breaths/min, Blood Pressure: 139/85 mmHg. Respiratory normal breathing without difficulty. Psychiatric this patient [name] able to make decisions and demonstrates good insight into disease process. Alert and Oriented x 3. pleasant and cooperative. General Notes: Upon inspection patient's wound bed actually showed signs of pretty good granulation epithelization at this point. Fortunately I do not see any signs of infection I did perform debridement clearway some necrotic debris patient tolerated this today without complication postdebridement this [name] much better we are going to attempt endoform over the next few weeks and see how things go. Integumentary (Hair, Skin) Wound #3 status [name] Open. Original cause of wound was Pressure Injury. The date acquired was: 06/15/2022. The wound has been in treatment 2 weeks. The wound [name] located on the Right T Fourth. The wound measures 0.5cm length x 0.4cm width x 0.1cm depth; 0.157cm^2 area and 0.016cm^3 volume. There [name] a oe medium amount of serosanguineous drainage noted. Assessment Active Problems ICD-10 Pressure ulcer of other site, stage 3 Non-pressure chronic ulcer of other part of right foot with fat layer exposed Chronic combined systolic (congestive) and diastolic (congestive) heart failure Vascular dementia, unspecified  severity,  with agitation Chronic obstructive pulmonary disease, unspecified Procedures Wound #3 Pre-procedure diagnosis of Wound #3 [name] a Pressure Ulcer located on the Right T Fourth . There was a Excisional Skin/Subcutaneous Tissue Debridement with oe a total area of 0.16 sq cm performed by Allen Derry, PA-C. With the following instrument(s): Curette to remove Viable and Non-Viable tissue/material. Material removed includes Callus, Subcutaneous Tissue, and Slough. A time out was conducted at 16:05, prior to the start of the procedure. A Minimum amount of bleeding was controlled with Pressure. The procedure was tolerated well. Post Debridement Measurements: 0.5cm length x 0.4cm width x 0.2cm depth; 0.031cm^3 volume. Post debridement Stage noted as Category/Stage III. Character of Wound/Ulcer Post Debridement [name] stable. Post procedure Diagnosis Wound #3: Same as Pre-Procedure Plan Follow-up Appointments: Return Appointment in 1 week. Bathing/ Shower/ Hygiene: Wash wounds with antibacterial soap and water. Anesthetic (Use 'Patient Medications' Section for Anesthetic Order Entry): Lidocaine applied to wound bed Edema Control - Lymphedema / Segmental Compressive Device / Other: Elevate, Exercise Daily and Avoid Standing for Long Periods of Time. Elevate legs to the level of the heart and pump ankles as often as possible Elevate leg(s) parallel to the floor when sitting. WOUND #3: - T Fourth Wound Laterality: Right oe Cleanser: Byram Ancillary Kit - 15 Day Supply (Generic) 3 x Per Week/30 Days Discharge Instructions: Use supplies as instructed; Kit contains: (15) Saline Bullets; (15) 3x3 Gauze; 15 pr Gloves Cleanser: Soap and Water 3 x Per Week/30 Days Discharge Instructions: Gently cleanse wound with antibacterial soap, rinse and pat dry prior to dressing wounds Prim Dressing: Endoform Natural, Non-fenestrated, 2x2 (in/in) 3 x Per Week/30 Days ary Secondary Dressing: Coverlet Latex-Free  Fabric Adhesive Dressings 3 x Per Week/30 Days Discharge Instructions: Knuckle 1. I would recommend that we going to switch away from the [facility] to endoform she will need to have some of the lambswool tucked between the toes that she will have the extra padding from the [facility]. 2. I am going to recommend as well coverlet to hold in place. 3. He should be changing this pretty much 3 times a week and we will see how things do following. We will see patient back for reevaluation in 1 week here in the clinic. If anything worsens or changes patient will contact our office for additional recommendations. SAWYER, KAHAN ([id number]) [id number] Page 8 of 8 Electronic Signature(s) Signed: 06/29/2022 4:17:54 PM By: Allen Derry PA-C Entered By: Allen Derry on 06/29/2022 16:17:54 -------------------------------------------------------------------------------- SuperBill Details Patient Name: Date of Service: [name], Yamilett [name]. 06/29/2022 Medical Record Number: [id number] Patient Account Number: [id number] Date of Birth/Sex: Treating RN: [birth year] (88 y.o. [name] Primary Care Provider: Marylynn Pearson Other Clinician: Betha Loa Referring Provider: Treating Provider/Extender: Lorna Dibble Weeks in Treatment: 18 Diagnosis Coding ICD-10 Codes Code Description [phone] Pressure ulcer of other site, stage 3 L97.512 Non-pressure chronic ulcer of other part of right foot with fat layer exposed I50.42 Chronic combined systolic (congestive) and diastolic (congestive) heart failure F01.511 Vascular dementia, unspecified severity, with agitation J44.9 Chronic obstructive pulmonary disease, unspecified Facility Procedures : CPT4 Code: [id number] Description: 11042 - DEB SUBQ TISSUE 20 SQ CM/< ICD-10 Diagnosis Description L89.893 Pressure ulcer of other site, stage 3 Modifier: Quantity: 1 Physician Procedures : CPT4 Code Description  Modifier [id number] 11042 - WC PHYS SUBQ TISS 20 SQ CM ICD-10 Diagnosis Description L89.893 Pressure ulcer of other site, stage 3 Quantity: 1 Electronic Signature(s) Signed: 06/29/2022 4:18:30 PM By: Allen Derry PA-C  Entered By: Allen Derry on 06/29/2022 16:18:30

## 2022-06-30 NOTE — Progress Notes (Signed)
XELA, OREGEL ([id number]) [id number] Page 1 of 8 Visit Report for 06/29/2022 Arrival Information Details Patient Name: Date of Service: Lacey, [name] 06/29/2022 3:45 PM Medical Record Number: [id number] Patient Account Number: [id number] Date of Birth/Sex: Treating RN: [birth year] (88 y.o. [name] Primary Care [name]: [name] Other Clinician: Betha [name] Referring [name]: Treating [name]/Extender: [name] Weeks in Treatment: 18 Visit Information History Since Last Visit All ordered tests and consults were completed: No Patient Arrived: Wheel Chair Added or deleted any medications: No Arrival Time: 15:38 Any new allergies or adverse reactions: No Transfer Assistance: EasyPivot Patient Lift Had a fall or experienced change in No Patient Identification Verified: Yes activities of daily living that may affect Secondary Verification Process Completed: Yes risk of falls: Patient Requires Transmission-Based Precautions: No Signs or symptoms of abuse/neglect since last visito No Patient Has Alerts: No Hospitalized since last visit: No Implantable device outside of the clinic excluding No cellular tissue based products placed in the center since last visit: Has Dressing in Place as Prescribed: Yes Pain Present Now: No Electronic Signature(s) Signed: 06/30/2022 5:16:31 PM By: [name] Entered By: [name] on 06/29/2022 15:39:21 -------------------------------------------------------------------------------- Clinic Level of Care Assessment Details Patient Name: Date of Service: [name], [name]. 06/29/2022 3:45 PM Medical Record Number: [id number] Patient Account Number: [id number] Date of Birth/Sex: Treating RN: 04-14-1934 (88 y.o. [name] Primary Care Leya Paige: [name] Other Clinician: Betha [name] Referring [name]: Treating [name]/Extender: [name] Weeks in Treatment: 18 Clinic Level of Care Assessment Items TOOL 1 Quantity Score []  - 0 Use when EandM and Procedure [name] performed on INITIAL visit ASSESSMENTS - Nursing Assessment / Reassessment []  - 0 General Physical Exam (combine w/ comprehensive assessment (listed just below) when performed on new pt. evals) []  - 0 Comprehensive Assessment (HX, ROS, Risk Assessments, Wounds Hx, etc.) [name] ([id number]) [id number] Page 2 of 8 ASSESSMENTS - Wound and Skin Assessment / Reassessment []  - 0 Dermatologic / Skin Assessment (not related to wound area) ASSESSMENTS - Ostomy and/or Continence Assessment and Care []  - 0 Incontinence Assessment and Management []  - 0 Ostomy Care Assessment and Management (repouching, etc.) PROCESS - Coordination of Care []  - 0 Simple Patient / Family Education for ongoing care []  - 0 Complex (extensive) Patient / Family Education for ongoing care []  - 0 Staff obtains Chiropractor, Records, T Results / Process Orders est []  - 0 Staff telephones HHA, Nursing Homes / Clarify orders / etc []  - 0 Routine Transfer to another Facility (non-emergent condition) []  - 0 Routine Hospital Admission (non-emergent condition) []  - 0 New Admissions / Manufacturing engineer / Ordering NPWT Apligraf, etc. , []  - 0 Emergency Hospital Admission (emergent condition) PROCESS - Special Needs []  - 0 Pediatric / Minor Patient Management []  - 0 Isolation Patient Management []  - 0 Hearing / Language / Visual special needs []  - 0 Assessment of Community assistance (transportation, D/C planning, etc.) []  - 0 Additional assistance / Altered mentation []  - 0 Support Surface(s) Assessment (bed, cushion, seat, etc.) INTERVENTIONS - Miscellaneous []  - 0 External ear exam []  - 0 Patient Transfer (multiple staff / Nurse, adult / Similar devices) []  - 0 Simple Staple / Suture removal (25 or less) []  - 0 Complex Staple / Suture  removal (26 or more) []  - 0 Hypo/Hyperglycemic Management (do not check if billed separately) []  - 0 Ankle / Brachial Index (ABI) - do not check if billed  separately Has the patient been seen at the hospital within the last three years: Yes Total Score: 0 Level Of Care: ____ Electronic Signature(s) Signed: 06/30/2022 5:16:31 PM By: [name] Entered By: [name] on 06/29/2022 16:07:29 -------------------------------------------------------------------------------- Encounter Discharge Information Details Patient Name: Date of Service: [name], [name]. 06/29/2022 3:45 PM Medical Record Number: [id number] Patient Account Number: [id number] Date of Birth/Sex: Treating RN: June 05, 1934 (88 y.o. [name] Primary Care Makale Pindell: [name] Other Clinician: Betha [name] Referring Sehar Sedano: Treating Latanga Nedrow/Extender: [name] [place], [place] ([id number]) [id number] Page 3 of 8 Weeks in Treatment: 18 Encounter Discharge Information Items Post Procedure Vitals Discharge Condition: Stable Temperature (F): 98.0 Ambulatory Status: Wheelchair Pulse (bpm): 85 Discharge Destination: Home Respiratory Rate (breaths/min): 18 Transportation: Private Auto Blood Pressure (mmHg): 139/85 Accompanied By: daughter Schedule Follow-up Appointment: Yes Clinical Summary of Care: Electronic Signature(s) Signed: 06/30/2022 5:16:31 PM By: [name] Entered By: [name] on 06/29/2022 16:17:03 -------------------------------------------------------------------------------- Lower Extremity Assessment Details Patient Name: Date of Service: [name], [name]. 06/29/2022 3:45 PM Medical Record Number: [id number] Patient Account Number: [id number] Date of Birth/Sex: Treating RN: 15-Sep-1934 (88 y.o. [name] Primary Care Amery Minasyan: [name] Other Clinician: Betha [name] Referring Haldon Carley: Treating Sigourney Portillo/Extender:  [name] Weeks in Treatment: 18 Electronic Signature(s) Signed: 06/29/2022 4:35:57 PM By: Midge Aver MSN RN CNS WTA Signed: 06/30/2022 5:16:31 PM By: [name] Entered By: [name] on 06/29/2022 15:50:41 -------------------------------------------------------------------------------- Multi Wound Chart Details Patient Name: Date of Service: [name], [name]. 06/29/2022 3:45 PM Medical Record Number: [id number] Patient Account Number: [id number] Date of Birth/Sex: Treating RN: 1934/10/01 (88 y.o. [name] Primary Care Reise Gladney: [name] Other Clinician: Betha [name] Referring Adelynne Joerger: Treating Bayli Quesinberry/Extender: [name] Weeks in Treatment: 18 Vital Signs Height(in): Pulse(bpm): 85 Weight(lbs): 105 Blood Pressure(mmHg): 139/85 Body Mass Index(BMI): Temperature(F): 98.0 Respiratory Rate(breaths/min): 16 [Pritz, Janayia [name] ([id number]):Photos:] [[id number] Page 4 of 8:3 N/A N/A N/A N/A] Right T Fourth oe N/A N/A Wound Location: Pressure Injury N/A N/A Wounding Event: Pressure Ulcer N/A N/A Primary Etiology: Chronic Obstructive Pulmonary N/A N/A Comorbid History: Disease (COPD), Congestive Heart Failure, Dementia 06/15/2022 N/A N/A Date Acquired: 2 N/A N/A Weeks of Treatment: Open N/A N/A Wound Status: No N/A N/A Wound Recurrence: 0.5x0.4x0.1 N/A N/A Measurements [name] x W x D (cm) 0.157 N/A N/A A (cm) : rea 0.016 N/A N/A Volume (cm) : 52.40% N/A N/A % Reduction in A rea: 75.80% N/A N/A % Reduction in Volume: Category/Stage III N/A N/A Classification: Medium N/A N/A Exudate A mount: Serosanguineous N/A N/A Exudate Type: red, brown N/A N/A Exudate Color: None N/A N/A Epithelialization: Treatment Notes Electronic Signature(s) Signed: 06/30/2022 5:16:31 PM By: [name] Entered By: [name] on 06/29/2022  15:50:46 -------------------------------------------------------------------------------- Multi-Disciplinary Care Plan Details Patient Name: Date of Service: [name], [name]. 06/29/2022 3:45 PM Medical Record Number: [id number] Patient Account Number: [id number] Date of Birth/Sex: Treating RN: [birth year] (88 y.o. [name] Primary Care Marsalis Beaulieu: [name] Other Clinician: Betha [name] Referring Daizee Firmin: Treating Brendi Mccarroll/Extender: [name] Weeks in Treatment: 18 Active Inactive Wound/Skin Impairment Nursing Diagnoses: Knowledge deficit related to ulceration/compromised skin integrity Goals: Patient/caregiver will verbalize understanding of skin care regimen Date Initiated: 02/22/2022 Date Inactivated: 04/19/2022 Target Resolution Date: 04/23/2022 Goal Status: Met Ulcer/skin breakdown will have a volume reduction of 30% by week 4 Date Initiated: 02/22/2022 Date Inactivated: 04/05/2022 Target Resolution Date: 03/23/2022 Goal Status: Unmet Unmet Reason: comorbities Piechota, Kamori  [name] ([id number]) [id number] Page 5 of 8 Ulcer/skin breakdown will have a volume reduction of 50% by week 8 Date Initiated: 02/22/2022 Date Inactivated: 04/19/2022 Target Resolution Date: 04/19/2022 Unmet Reason: underlying conditions, Goal Status: Unmet wound improving Ulcer/skin breakdown will have a volume reduction of 80% by week 12 Date Initiated: 02/22/2022 Target Resolution Date: 05/23/2022 Goal Status: Active Ulcer/skin breakdown will heal within 14 weeks Date Initiated: 02/22/2022 Target Resolution Date: 06/23/2022 Goal Status: Active Interventions: Assess patient/caregiver ability to obtain necessary supplies Assess patient/caregiver ability to perform ulcer/skin care regimen upon admission and as needed Assess ulceration(s) every visit Notes: Electronic Signature(s) Signed: 06/29/2022 4:35:57 PM By: Midge Aver MSN RN CNS WTA Signed:  06/30/2022 5:16:31 PM By: [name] Entered By: [name] on 06/29/2022 16:08:24 -------------------------------------------------------------------------------- Pain Assessment Details Patient Name: Date of Service: [name], [name]. 06/29/2022 3:45 PM Medical Record Number: [id number] Patient Account Number: [id number] Date of Birth/Sex: Treating RN: May 19, 1934 (88 y.o. [name] Primary Care Matan Steen: [name] Other Clinician: Betha [name] Referring Antawan Mchugh: Treating Iridessa Harrow/Extender: [name] Weeks in Treatment: 18 Active Problems Location of Pain Severity and Description of Pain Patient Has Paino No Site Locations Pain Management and Medication Current Pain Management: Electronic Signature(s) Signed: 06/29/2022 4:35:57 PM By: Midge Aver MSN RN CNS Regan Rakers, Alexah [name] ([id number]) [phone].pdf Page 6 of 8 Signed: 06/30/2022 5:16:31 PM By: [name] Entered By: [name] on 06/29/2022 15:42:47 -------------------------------------------------------------------------------- Patient/Caregiver Education Details Patient Name: Date of Service: [name], [name] 6/25/2024andnbsp3:45 PM Medical Record Number: [id number] Patient Account Number: [id number] Date of Birth/Gender: Treating RN: 1934-12-24 (88 y.o. [name] Primary Care Physician: [name] Other Clinician: Betha [name] Referring Physician: Treating Physician/Extender: [name] Weeks in Treatment: 31 Education Assessment Education Provided To: Patient Education Topics Provided Wound/Skin Impairment: Handouts: Other: continue wound care as directed Methods: Explain/Verbal Responses: State content correctly Electronic Signature(s) Signed: 06/30/2022 5:16:31 PM By: [name] Entered By: [name] on 06/29/2022  16:08:43 -------------------------------------------------------------------------------- Wound Assessment Details Patient Name: Date of Service: [name], [name]. 06/29/2022 3:45 PM Medical Record Number: [id number] Patient Account Number: [id number] Date of Birth/Sex: Treating RN: [birth year] (88 y.o. [name] Primary Care Arslan Kier: [name] Other Clinician: Betha [name] Referring Vandora Jaskulski: Treating Jema Deegan/Extender: [name] Weeks in Treatment: 18 Wound Status Wound Number: 3 Primary Pressure Ulcer Etiology: Wound Location: Right T Fourth oe Wound Open Wounding Event: Pressure Injury Status: Date Acquired: 06/15/2022 Comorbid Chronic Obstructive Pulmonary Disease (COPD), Congestive Weeks Of Treatment: 2 History: Heart Failure, Dementia Clustered Wound: No [place], Mithra [name] ([id number]) [id number] Page 7 of 8 Photos Wound Measurements Length: (cm) 0.5 Width: (cm) 0.4 Depth: (cm) 0.1 Area: (cm) 0.157 Volume: (cm) 0.016 % Reduction in Area: 52.4% % Reduction in Volume: 75.8% Epithelialization: None Wound Description Classification: Category/Stage III Exudate Amount: Medium Exudate Type: Serosanguineous Exudate Color: red, brown Foul Odor After Cleansing: No Slough/Fibrino Yes Treatment Notes Wound #3 (Toe Fourth) Wound Laterality: Right Cleanser Byram Ancillary Kit - 15 Day Supply Discharge Instruction: Use supplies as instructed; Kit contains: (15) Saline Bullets; (15) 3x3 Gauze; 15 pr Gloves Soap and Water Discharge Instruction: Gently cleanse wound with antibacterial soap, rinse and pat dry prior to dressing wounds Peri-Wound Care Topical Primary Dressing Endoform Natural, Non-fenestrated, 2x2 (in/in) Secondary Dressing Coverlet Latex-Free Fabric Adhesive Dressings Discharge Instruction: Knuckle Secured With Compression Wrap Compression Stockings Add-Ons Electronic Signature(s) Signed: 06/29/2022  4:35:57 PM By: Midge Aver MSN RN CNS WTA Signed:  06/30/2022 5:16:31 PM By: [name] Entered By: [name] on 06/29/2022 15:50:33 Dawson, Eleen [name] ([id number]) [id number] Page 8 of 8 -------------------------------------------------------------------------------- Vitals Details Patient Name: Date of Service: [name], [name] 06/29/2022 3:45 PM Medical Record Number: [id number] Patient Account Number: [id number] Date of Birth/Sex: Treating RN: December 11, 1934 (88 y.o. [name] Primary Care Derryl Uher: [name] Other Clinician: Betha [name] Referring Allyssia Skluzacek: Treating Declan Mier/Extender: [name] Weeks in Treatment: 18 Vital Signs Time Taken: 15:40 Temperature (F): 98.0 Weight (lbs): 105 Pulse (bpm): 85 Respiratory Rate (breaths/min): 16 Blood Pressure (mmHg): 139/85 Reference Range: 80 - 120 mg / dl Electronic Signature(s) Signed: 06/30/2022 5:16:31 PM By: [name] Entered By: [name] on 06/29/2022 15:42:43

## 2022-07-12 ENCOUNTER — Encounter: Payer: Medicare Other | Attending: Physician Assistant | Admitting: Physician Assistant

## 2022-07-12 ENCOUNTER — Emergency Department
Admission: EM | Admit: 2022-07-12 | Discharge: 2022-07-12 | Discharge status: Against Medical Advice | Payer: Medicare Other

## 2022-07-12 DIAGNOSIS — L89893 Pressure ulcer of other site, stage 3: Secondary | ICD-10-CM | POA: Insufficient documentation

## 2022-07-12 DIAGNOSIS — J449 Chronic obstructive pulmonary disease, unspecified: Secondary | ICD-10-CM | POA: Diagnosis present

## 2022-07-12 DIAGNOSIS — L97512 Non-pressure chronic ulcer of other part of right foot with fat layer exposed: Secondary | ICD-10-CM | POA: Diagnosis not present

## 2022-07-12 DIAGNOSIS — F0150 Vascular dementia without behavioral disturbance: Secondary | ICD-10-CM | POA: Diagnosis not present

## 2022-07-12 DIAGNOSIS — I5042 Chronic combined systolic (congestive) and diastolic (congestive) heart failure: Secondary | ICD-10-CM | POA: Diagnosis not present

## 2022-07-12 NOTE — Progress Notes (Addendum)
[name], [name] ([id number]) [id number] Page 1 of 9 Visit Report for 07/12/2022 Arrival Information Details Patient Name: Date of Service: CEAIRA, ERNSTER 07/12/2022 12:45 PM Medical Record Number: [id number] Patient Account Number: [id number] Date of Birth/Sex: Treating RN: 06-27-1934 (88 y.o. [name] Primary Care Icelynn Onken: Marylynn Pearson Other Clinician: Betha Loa Referring Letrell Attwood: Treating Trameka Dorough/Extender: Lorna Dibble Weeks in Treatment: 20 Visit Information History Since Last Visit All ordered tests and consults were completed: No Patient Arrived: Wheel Chair Added or deleted any medications: No Arrival Time: 12:55 Any new allergies or adverse reactions: No Transfer Assistance: None Had a fall or experienced change in No Patient Identification Verified: Yes activities of daily living that may affect Secondary Verification Process Completed: Yes risk of falls: Patient Requires Transmission-Based Precautions: No Signs or symptoms of abuse/neglect since last visito No Patient Has Alerts: No Hospitalized since last visit: No Implantable device outside of the clinic excluding No cellular tissue based products placed in the center since last visit: Has Dressing in Place as Prescribed: Yes Pain Present Now: No Electronic Signature(s) Signed: 07/19/2022 4:36:38 PM By: Betha Loa Entered By: Betha Loa on 07/12/2022 12:56:05 -------------------------------------------------------------------------------- Clinic Level of Care Assessment Details Patient Name: Date of Service: [name], [name] 07/12/2022 12:45 PM Medical Record Number: [id number] Patient Account Number: [id number] Date of Birth/Sex: Treating RN: 1934/02/13 (88 y.o. [name] Primary Care Shahla Betsill: Marylynn Pearson Other Clinician: Betha Loa Referring Kalissa Grays: Treating Rowyn Mustapha/Extender: Lorna Dibble Weeks in Treatment:  20 Clinic Level of Care Assessment Items TOOL 4 Quantity Score []  - 0 Use when only an EandM is performed on FOLLOW-UP visit ASSESSMENTS - Nursing Assessment / Reassessment X- 1 10 Reassessment of Co-morbidities (includes updates in patient status) X- 1 5 Reassessment of Adherence to Treatment Plan Beilke, Jeraldean L ([id number]) [id number] Page 2 of 9 ASSESSMENTS - Wound and Skin A ssessment / Reassessment X - Simple Wound Assessment / Reassessment - one wound 1 5 []  - 0 Complex Wound Assessment / Reassessment - multiple wounds []  - 0 Dermatologic / Skin Assessment (not related to wound area) ASSESSMENTS - Focused Assessment []  - 0 Circumferential Edema Measurements - multi extremities []  - 0 Nutritional Assessment / Counseling / Intervention []  - 0 Lower Extremity Assessment (monofilament, tuning fork, pulses) []  - 0 Peripheral Arterial Disease Assessment (using hand held doppler) ASSESSMENTS - Ostomy and/or Continence Assessment and Care []  - 0 Incontinence Assessment and Management []  - 0 Ostomy Care Assessment and Management (repouching, etc.) PROCESS - Coordination of Care X - Simple Patient / Family Education for ongoing care 1 15 []  - 0 Complex (extensive) Patient / Family Education for ongoing care []  - 0 Staff obtains Chiropractor, Records, T Results / Process Orders est []  - 0 Staff telephones HHA, Nursing Homes / Clarify orders / etc []  - 0 Routine Transfer to another Facility (non-emergent condition) []  - 0 Routine Hospital Admission (non-emergent condition) []  - 0 New Admissions / Manufacturing engineer / Ordering NPWT Apligraf, etc. , []  - 0 Emergency Hospital Admission (emergent condition) X- 1 10 Simple Discharge Coordination []  - 0 Complex (extensive) Discharge Coordination PROCESS - Special Needs []  - 0 Pediatric / Minor Patient Management []  - 0 Isolation Patient Management []  - 0 Hearing / Language / Visual special  needs []  - 0 Assessment of Community assistance (transportation, D/C planning, etc.) []  - 0 Additional assistance / Altered mentation []  - 0 Support Surface(s) Assessment (bed, cushion, seat, etc.) INTERVENTIONS -  Wound Cleansing / Measurement X - Simple Wound Cleansing - one wound 1 5 []  - 0 Complex Wound Cleansing - multiple wounds X- 1 5 Wound Imaging (photographs - any number of wounds) []  - 0 Wound Tracing (instead of photographs) X- 1 5 Simple Wound Measurement - one wound []  - 0 Complex Wound Measurement - multiple wounds INTERVENTIONS - Wound Dressings X - Small Wound Dressing one or multiple wounds 1 10 []  - 0 Medium Wound Dressing one or multiple wounds []  - 0 Large Wound Dressing one or multiple wounds []  - 0 Application of Medications - topical []  - 0 Application of Medications - injection INTERVENTIONS - Miscellaneous []  - 0 External ear exam Sauceda, Wilene L ([id number]) [id number] Page 3 of 9 []  - 0 Specimen Collection (cultures, biopsies, blood, body fluids, etc.) []  - 0 Specimen(s) / Culture(s) sent or taken to Lab for analysis []  - 0 Patient Transfer (multiple staff / Michiel Sites Lift / Similar devices) []  - 0 Simple Staple / Suture removal (25 or less) []  - 0 Complex Staple / Suture removal (26 or more) []  - 0 Hypo / Hyperglycemic Management (close monitor of Blood Glucose) []  - 0 Ankle / Brachial Index (ABI) - do not check if billed separately X- 1 5 Vital Signs Has the patient been seen at the hospital within the last three years: Yes Total Score: 75 Level Of Care: New/Established - Level 2 Electronic Signature(s) Signed: 07/19/2022 4:36:38 PM By: Betha Loa Entered By: Betha Loa on 07/12/2022 13:32:11 -------------------------------------------------------------------------------- Complex / Palliative Patient Assessment Details Patient Name: Date of Service: [name], [name] 07/12/2022 12:45 PM Medical Record  Number: [id number] Patient Account Number: [id number] Date of Birth/Sex: Treating RN: [birth year] (88 y.o. [name] Primary Care Ettamae Barkett: Marylynn Pearson Other Clinician: Betha Loa Referring Kileen Lange: Treating Assad Harbeson/Extender: Lorna Dibble Weeks in Treatment: 20 Complex Wound Management Criteria Patient has remarkable or complex co-morbidities requiring medications or treatments that extend wound healing times. Examples: Diabetes mellitus with chronic renal failure or end stage renal disease requiring dialysis Advanced or poorly controlled rheumatoid arthritis Diabetes mellitus and end stage chronic obstructive pulmonary disease Active cancer with current chemo- or radiation therapy CHF, COPD, Dementia Palliative Wound Management Criteria Care Approach Wound Care Plan: Complex Wound Management Electronic Signature(s) Signed: 07/15/2022 1:42:15 PM By: Yevonne Pax RN Signed: 07/15/2022 5:17:09 PM By: Allen Derry PA-C Entered By: Yevonne Pax on 07/15/2022 13:42:15 Kucher, Ninel L ([id number]) [id number] Page 4 of 9 -------------------------------------------------------------------------------- Encounter Discharge Information Details Patient Name: Date of Service: Vedia Pereyra, Abisola L. 07/12/2022 12:45 PM Medical Record Number: [id number] Patient Account Number: [id number] Date of Birth/Sex: Treating RN: 1934-07-20 (88 y.o. [name] Primary Care Kalista Laguardia: Marylynn Pearson Other Clinician: Betha Loa Referring Krystle Oberman: Treating Branden Shallenberger/Extender: Lorna Dibble Weeks in Treatment: 20 Encounter Discharge Information Items Discharge Condition: Stable Ambulatory Status: Wheelchair Discharge Destination: Home Transportation: Private Auto Accompanied By: daughter Schedule Follow-up Appointment: Yes Clinical Summary of Care: Electronic Signature(s) Signed: 07/19/2022 4:36:38 PM By: Betha Loa Entered By:  Betha Loa on 07/12/2022 13:42:03 -------------------------------------------------------------------------------- Lower Extremity Assessment Details Patient Name: Date of Service: [name], Marge L. 07/12/2022 12:45 PM Medical Record Number: [id number] Patient Account Number: [id number] Date of Birth/Sex: Treating RN: [birth year] (88 y.o. [name] Primary Care Jaymeson Mengel: Marylynn Pearson Other Clinician: Betha Loa Referring Rocklin Soderquist: Treating Deliah Strehlow/Extender: Lorna Dibble Weeks in Treatment: 20 Electronic Signature(s) Signed: 07/15/2022 1:44:50 PM By: Yevonne Pax RN Signed: 07/19/2022 4:36:38 PM  By: Betha Loa Entered By: Betha Loa on 07/12/2022 13:06:21 Gural, Arla L ([id number]) [id number] Page 5 of 9 -------------------------------------------------------------------------------- Multi Wound Chart Details Patient Name: Date of Service: Theressa Millard IS, Leola L. 07/12/2022 12:45 PM Medical Record Number: [id number] Patient Account Number: [id number] Date of Birth/Sex: Treating RN: [birth year] (88 y.o. [name] Primary Care Aser Nylund: Marylynn Pearson Other Clinician: Betha Loa Referring Danyetta Gillham: Treating Davinity Fanara/Extender: Lorna Dibble Weeks in Treatment: 20 Vital Signs Height(in): Pulse(bpm): 84 Weight(lbs): 105 Blood Pressure(mmHg): 128/84 Body Mass Index(BMI): Temperature(F): 97.8 Respiratory Rate(breaths/min): 16 [3:Photos:] [N/A:N/A] Right T Fourth oe N/A N/A Wound Location: Pressure Injury N/A N/A Wounding Event: Pressure Ulcer N/A N/A Primary Etiology: Chronic Obstructive Pulmonary N/A N/A Comorbid History: Disease (COPD), Congestive Heart Failure, Dementia 06/15/2022 N/A N/A Date Acquired: 3 N/A N/A Weeks of Treatment: Open N/A N/A Wound Status: No N/A N/A Wound Recurrence: 0.2x0.2x0.2 N/A N/A Measurements L x W x D (cm) 0.031 N/A N/A A (cm) : rea 0.006 N/A  N/A Volume (cm) : 90.60% N/A N/A % Reduction in A rea: 90.90% N/A N/A % Reduction in Volume: Category/Stage III N/A N/A Classification: Medium N/A N/A Exudate A mount: Serosanguineous N/A N/A Exudate Type: red, brown N/A N/A Exudate Color: None N/A N/A Epithelialization: Treatment Notes Electronic Signature(s) Signed: 07/19/2022 4:36:38 PM By: Betha Loa Entered By: Betha Loa on 07/12/2022 13:06:27 Liskey, Lorena L ([id number]) [id number] Page 6 of 9 -------------------------------------------------------------------------------- Multi-Disciplinary Care Plan Details Patient Name: Date of Service: Theressa Millard IS, Jaydalee L. 07/12/2022 12:45 PM Medical Record Number: [id number] Patient Account Number: [id number] Date of Birth/Sex: Treating RN: November 27, 1934 (88 y.o. [name] Primary Care Iriel Nason: Marylynn Pearson Other Clinician: Betha Loa Referring Evann Koelzer: Treating Callum Wolf/Extender: Lorna Dibble Weeks in Treatment: 20 Active Inactive Wound/Skin Impairment Nursing Diagnoses: Knowledge deficit related to ulceration/compromised skin integrity Goals: Patient/caregiver will verbalize understanding of skin care regimen Date Initiated: 02/22/2022 Date Inactivated: 04/19/2022 Target Resolution Date: 04/23/2022 Goal Status: Met Ulcer/skin breakdown will have a volume reduction of 30% by week 4 Date Initiated: 02/22/2022 Date Inactivated: 04/05/2022 Target Resolution Date: 03/23/2022 Goal Status: Unmet Unmet Reason: comorbities Ulcer/skin breakdown will have a volume reduction of 50% by week 8 Date Initiated: 02/22/2022 Date Inactivated: 04/19/2022 Target Resolution Date: 04/19/2022 Unmet Reason: underlying conditions, Goal Status: Unmet wound improving Ulcer/skin breakdown will have a volume reduction of 80% by week 12 Date Initiated: 02/22/2022 Target Resolution Date: 05/23/2022 Goal Status: Active Ulcer/skin breakdown will heal  within 14 weeks Date Initiated: 02/22/2022 Target Resolution Date: 06/23/2022 Goal Status: Active Interventions: Assess patient/caregiver ability to obtain necessary supplies Assess patient/caregiver ability to perform ulcer/skin care regimen upon admission and as needed Assess ulceration(s) every visit Notes: Electronic Signature(s) Signed: 07/15/2022 1:44:50 PM By: Yevonne Pax RN Signed: 07/19/2022 4:36:38 PM By: Betha Loa Entered By: Betha Loa on 07/12/2022 13:32:23 Pain Assessment Details -------------------------------------------------------------------------------- [name] ([id number]) [id number] Page 7 of 9 Patient Name: Date of Service: Vedia Pereyra, Lucilla L. 07/12/2022 12:45 PM Medical Record Number: [id number] Patient Account Number: [id number] Date of Birth/Sex: Treating RN: Nov 22, 1934 (88 y.o. [name] Primary Care Baylynn Shifflett: Marylynn Pearson Other Clinician: Betha Loa Referring Reuel Lamadrid: Treating Lynnex Fulp/Extender: Lorna Dibble Weeks in Treatment: 20 Active Problems Location of Pain Severity and Description of Pain Patient Has Paino No Site Locations Pain Management and Medication Current Pain Management: Electronic Signature(s) Signed: 07/15/2022 1:44:50 PM By: Yevonne Pax RN Signed: 07/19/2022 4:36:38 PM By: Betha Loa Entered By: Betha Loa on 07/12/2022 12:58:45 -------------------------------------------------------------------------------- Patient/Caregiver  Education Details Patient Name: Date of Service: SVETLANA, BAGBY 7/8/2024andnbsp12:45 PM Medical Record Number: [id number] Patient Account Number: [id number] Date of Birth/Gender: Treating RN: 1934-06-30 (88 y.o. [name] Primary Care Physician: Marylynn Pearson Other Clinician: Betha Loa Referring Physician: Treating Physician/Extender: Lorna Dibble Weeks in Treatment: 20 Education  Assessment Education Provided To: Patient Education Topics Provided Wound/Skin Impairment: Handouts: Other: continue wound care as directed Electronic Signature(s) Signed: 07/19/2022 4:36:38 PM By: Lorenda Peck, Jadalyn L ([id number]) [id number] Page 8 of 9 Signed: 07/19/2022 4:36:38 PM By: Betha Loa Entered By: Betha Loa on 07/12/2022 13:32:49 -------------------------------------------------------------------------------- Wound Assessment Details Patient Name: Date of Service: [name], Rhylin L. 07/12/2022 12:45 PM Medical Record Number: [id number] Patient Account Number: [id number] Date of Birth/Sex: Treating RN: 1934-02-22 (88 y.o. [name] Primary Care Kupono Marling: Marylynn Pearson Other Clinician: Betha Loa Referring Mechell Girgis: Treating Rucha Wissinger/Extender: Lorna Dibble Weeks in Treatment: 20 Wound Status Wound Number: 3 Primary Pressure Ulcer Etiology: Wound Location: Right T Fourth oe Wound Open Wounding Event: Pressure Injury Status: Date Acquired: 06/15/2022 Comorbid Chronic Obstructive Pulmonary Disease (COPD), Congestive Weeks Of Treatment: 3 History: Heart Failure, Dementia Clustered Wound: No Photos Wound Measurements Length: (cm) 0.2 Width: (cm) 0.2 Depth: (cm) 0.2 Area: (cm) 0.031 Volume: (cm) 0.006 % Reduction in Area: 90.6% % Reduction in Volume: 90.9% Epithelialization: None Wound Description Classification: Category/Stage III Exudate Amount: Medium Exudate Type: Serosanguineous Exudate Color: red, brown Foul Odor After Cleansing: No Slough/Fibrino Yes Electronic Signature(s) Signed: 07/15/2022 1:44:50 PM By: Yevonne Pax RN Signed: 07/19/2022 4:36:38 PM By: Betha Loa Entered By: Betha Loa on 07/12/2022 13:06:12 Mensinger, Fiza L ([id number]) [id number] Page 9 of 9 -------------------------------------------------------------------------------- Vitals  Details Patient Name: Date of Service: DA Seth Bake IS, Anjel L. 07/12/2022 12:45 PM Medical Record Number: [id number] Patient Account Number: [id number] Date of Birth/Sex: Treating RN: 1934/10/13 (88 y.o. [name] Primary Care Jaymon Dudek: Marylynn Pearson Other Clinician: Betha Loa Referring Danyla Wattley: Treating Prince Olivier/Extender: Lorna Dibble Weeks in Treatment: 20 Vital Signs Time Taken: 12:56 Temperature (F): 97.8 Weight (lbs): 105 Pulse (bpm): 84 Respiratory Rate (breaths/min): 16 Blood Pressure (mmHg): 128/84 Reference Range: 80 - 120 mg / dl Electronic Signature(s) Signed: 07/19/2022 4:36:38 PM By: Betha Loa Entered By: Betha Loa on 07/12/2022 12:58:41

## 2022-07-12 NOTE — Progress Notes (Signed)
GREISY, DERFLINGER ([id number]) [id number] Page 1 of 8 Visit Report for 07/12/2022 Chief Complaint Document Details Patient Name: Date of Service: [name] IS, Karis L. 07/12/2022 12:45 PM Medical Record Number: [id number] Patient Account Number: [id number] Date of Birth/Sex: Treating RN: 27-Dec-1934 (88 y.o. [name] Primary Care Provider: Marylynn Pearson Other Clinician: Betha Loa Referring Provider: Treating Provider/Extender: Lorna Dibble Weeks in Treatment: 20 Information Obtained from: Patient Chief Complaint Left foot pressure ulcer and right 4th toe ulcer Electronic Signature(s) Signed: 07/12/2022 12:36:10 PM By: Allen Derry PA-C Entered By: Allen Derry on 07/12/2022 12:36:10 -------------------------------------------------------------------------------- HPI Details Patient Name: Date of Service: [name], Chrissi L. 07/12/2022 12:45 PM Medical Record Number: [id number] Patient Account Number: [id number] Date of Birth/Sex: Treating RN: 04-25-1934 (88 y.o. [name] Primary Care Provider: Marylynn Pearson Other Clinician: Betha Loa Referring Provider: Treating Provider/Extender: Lorna Dibble Weeks in Treatment: 20 History of Present Illness HPI Description: 02-22-2022 upon evaluation today patient appears to be doing somewhat poorly in regard to her left medial first metatarsal head where she does have a bunion. Unfortunately this is causing some pressure and has led to a pressure injury at this site. Fortunately I do not see any signs of infection at this point but that is something we definitely can need to keep a close eye on. Fortunately there does not appear to be any signs of active infection locally nor systemically at this point. No fevers, chills, nausea, vomiting, or diarrhea. Patient does have a history of of vascular dementia, COPD, congestive heart failure, and the dementia does affect her ability to  be able to in some cases follow directions. She is very nervous about things in general. This is stated to have started somewhere around January 22, 2022. 03-02-2022 upon evaluation today patient appears to be doing well currently in regard to her wounds all things considered. Fortunately I do not see any signs of infection unfortunately both wounds are still open and the one on the medial foot first metatarsal location is still the worst. Although the area over the fifth toe right foot actually is also open at this point unfortunately. With that being said I do think we can need to continue to monitor for any signs of worsening or infection although right now I think she is doing okay in that regard. 3/4; this is a patient with a bunion wound on the left medial foot. She also had a small blister on the right lateral fifth toe. She went for x-rays last week no acute fracture or dislocation was seen on the left side she had worsened moderate hallucis valgus and moderate second and third fourth fifth tarsometatarsal osteoarthritis. We use Prisma. 3/11; patient presents for follow-up. She has been using endoform to the left medial foot wound. She has cushion pads that she uses in between the toes to Herrle, Angelita L ([id number]) [id number] Page 2 of 8 help offload the areas and prevent future wounds. The right lateral fifth met head remains closed. 03-22-2022 upon evaluation today patient appears to be doing well currently in regard to her wound. She does show some signs of improvement here. She is also showing some signs of being a little bit too moist I think we may want to cut back on the hydrogel possibly just using the saline only at this point she is in agreement with that plan. Fortunately I do not see any evidence of active infection locally nor systemically which is great news. No  fevers, chills, nausea, vomiting, or diarrhea. 03-29-22 upon evaluation today patient  actually appears to be making excellent progress and very pleased with how things appear and how the wound is doing. I do think that we are moving in the right direction and very pleased in that regard. No fevers, chills, nausea, vomiting, or diarrhea. 04-05-2022 upon evaluation today patient appears to be doing well currently in regard to her wound. This is actually showing signs of improvement. Fortunately there does not appear to be any signs of active infection locally nor systemically at this time which is great news. No fevers, chills, nausea, vomiting, or diarrhea. 04-12-2022 upon evaluation today patient's wound actually showing signs of some improvement. Fortunately there does not appear to be any evidence of active infection locally nor systemically which is great news. No fevers, chills, nausea, vomiting, or diarrhea. 04-19-2022 upon evaluation today patient actually appears to be doing quite well in regard to her wound. She has been tolerating the dressing changes without complication. Fortunately there does not appear to be any signs of active infection locally or systemically which is great news. No fevers, chills, nausea, vomiting, or diarrhea. 05-03-2022 upon evaluation today patient appears to be doing better in regard to her foot ulcer this is actually very close to complete resolution. Fortunately I do not see any signs of active infection locally nor systemically. 05-10-2022 upon evaluation today patient appears to be doing well currently in regard to her wound there is some need for sharp debridement but minimal. 05-17-2022 upon evaluation today patient appears to be doing well currently in regard to her foot ulcer. This is still continue to make good progress. Fortunately there does not appear to be any signs of active infection locally nor systemically 05-24-2022 upon patient today patient actually appears to be doing to her wound. This seems to be making some slow I am extremely pleased  with where we stand currently. I do not see any signs of infection locally nor systemically which is great news. 06-01-2022 upon evaluation today patient's wound actually appears to be doing excellent. I do not see any signs of active infection at this time which is great news and overall I think she is actually very close to complete resolution. I am extremely pleased with where things stand today. In general I think that were moving appropriately towards closure. 06-07-2022 upon evaluation today patient appears to be doing well currently in regard to her wound. This is still a little bit open we were hoping that it might be closed but again this does not appear to be the case. Fortunately I do not see any signs of worsening I think she is doing well in general however 06-15-2022 upon evaluation today patient appears to be doing better in regard to the wound on her left foot although she does have a new wound on her right foot at her toe and this is a pressure ulcer at this time location. With that being said I do not see any signs of infection here obvious at the moment but again this does appear to be somewhat painful for her and again this is new even compared to last week when I last saw her. The left foot seems to be doing much better. 06-25-2022 patient's left flank still continues to show signs of improvement. Fortunately I do not see anything open. The fourth toe right foot actually showed signs of some issues here although it looks better than last week I am actually very pleased  with where things stand. 06-29-2022 upon evaluation today patient appears to be doing well currently in regard to her left foot which is still healed. In regard to the right foot we are still continuing to utilize the [facility] at this point. With that being said I do believe that the patient is going to need to switch to something else. I would recommend that we attempt to do the endoform which is done well for the  left foot. 07-12-2022 upon evaluation today patient appears to be doing poorly in regard to her toes which all appear to be somewhat red whether being padded between 1 to another I am not exactly sure what is going on here I am not 100% convinced this is infection but I cannot be 100% it is not either. With that being said I do think that there is at least a chance of a urinary tract infection here based on the symptoms the patient is experiencing I would not put that out of the question she has been a little bit more confused much more belligerent according to her daughter and again her dementia seems to be overall just worse in general. She also been urinating more than normal which is unusual as far as having accidents is concerned. Electronic Signature(s) Signed: 07/12/2022 1:48:23 PM By: Allen Derry PA-C Entered By: Allen Derry on 07/12/2022 13:48:23 -------------------------------------------------------------------------------- Physical Exam Details Patient Name: Date of Service: [name], Aaliya L. 07/12/2022 12:45 PM Medical Record Number: [id number] Patient Account Number: [id number] Date of Birth/Sex: Treating RN: [birth year] (88 y.o. [name] Primary Care Provider: Marylynn Pearson Other Clinician: Betha Loa Referring Provider: Treating Provider/Extender: Lorna Dibble Weeks in Treatment: 20 Mottola, Macon L ([id number]) [id number] Page 3 of 8 Constitutional Well-nourished and well-hydrated in no acute distress. Respiratory normal breathing without difficulty. Psychiatric this patient is able to make decisions and demonstrates good insight into disease process. Alert and Oriented x 3. pleasant and cooperative. Notes Upon inspection patient's wound bed actually showed signs of good granulation epithelization at this point. Fortunately there does not appear to be any evidence of active infection locally or systemically which is great  news and in general I do believe that we are making good headway towards complete closure. Electronic Signature(s) Signed: 07/12/2022 1:48:41 PM By: Allen Derry PA-C Entered By: Allen Derry on 07/12/2022 13:48:41 -------------------------------------------------------------------------------- Physician Orders Details Patient Name: Date of Service: [name], Lorree L. 07/12/2022 12:45 PM Medical Record Number: [id number] Patient Account Number: [id number] Date of Birth/Sex: Treating RN: Aug 27, 1934 (88 y.o. 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 Bathing/ [employer] wounds with antibacterial soap and water. Anesthetic (Use 'Patient Medications' Section for Anesthetic Order Entry) Lidocaine applied to wound bed Edema Control - Lymphedema / Segmental Compressive Device / Other Elevate, Exercise Daily and A void Standing for Long Periods of Time. Elevate legs to the level of the heart and pump ankles as often as possible Elevate leg(s) parallel to the floor  when sitting. Wound Treatment Wound #3 - T Fourth oe Wound Laterality: Right Cleanser: Byram Ancillary Kit - 15 Day Supply (Generic) 3 x Per Week/30 Days Discharge Instructions: Use supplies as instructed; Kit contains: (15) Saline Bullets; (15) 3x3 Gauze; 15 pr  Gloves Cleanser: Soap and Water 3 x Per Week/30 Days Discharge Instructions: Gently cleanse wound with antibacterial soap, rinse and pat dry prior to dressing wounds Prim Dressing: Endoform Natural, Non-fenestrated, 2x2 (in/in) ary 3 x Per Week/30 Days Cesaro, Verneta L ([id number]) [id number] Page 4 of 8 Secondary Dressing: Coverlet Latex-Free Fabric Adhesive Dressings 3 x Per Week/30 Days Discharge Instructions: Knuckle Patient Medications llergies: Betadine, doxycycline monohydrate, doxycycline hyclate, trimethoprim, zinc, zinc oxide, Dihydroaminopryidine Antibiotics, benzoyl peroxide, A Septra, Green T (Camellia Sinensis), salicylates, NSAIDS (Non-Steroidal Anti-Inflammatory Drug), Pyrazolones, tigecycline, Sulfa (Sulfonamide Antibiotics) ea Notifications Medication Indication Start End 07/13/2022 clindamycin HCl DOSE 1 - oral 300 mg capsule - 1 capsule oral three times daily x 15 days Electronic Signature(s) Signed: 07/13/2022 10:56:49 AM By: Allen Derry PA-C Previous Signature: 07/12/2022 5:05:34 PM Version By: Allen Derry PA-C Entered By: Allen Derry on 07/13/2022 10:56:49 -------------------------------------------------------------------------------- Problem List Details Patient Name: Date of Service: [name], Tyjae L. 07/12/2022 12:45 PM Medical Record Number: [id number] Patient Account Number: [id number] Date of Birth/Sex: Treating RN: [birth year] (88 y.o. [name] Primary Care Provider: Marylynn Pearson Other Clinician: Betha Loa Referring Provider: Treating Provider/Extender: Lorna Dibble Weeks in Treatment: 20 Active Problems ICD-10 Encounter Code Description Active Date MDM Diagnosis L89.893 Pressure ulcer of other site, stage 3 02/22/2022 No Yes L97.512 Non-pressure chronic ulcer of other part of right foot with fat layer exposed 06/15/2022 No Yes I50.42 Chronic combined systolic (congestive) and diastolic (congestive)  heart failure 02/22/2022 No Yes F01.511 Vascular dementia, unspecified severity, with agitation 02/22/2022 No Yes J44.9 Chronic obstructive pulmonary disease, unspecified 02/22/2022 No Yes Inactive Problems Resolved Problems Electronic Signature(s) Geiger, Tasneem L ([id number]) [id number] Page 5 of 8 Signed: 07/12/2022 12:36:05 PM By: Allen Derry PA-C Entered By: Allen Derry on 07/12/2022 12:36:05 -------------------------------------------------------------------------------- Progress Note Details Patient Name: Date of Service: [name], Siennah L. 07/12/2022 12:45 PM Medical Record Number: [id number] Patient Account Number: [id number] Date of Birth/Sex: Treating RN: 04/14/1934 (88 y.o. [name] Primary Care Provider: Marylynn Pearson Other Clinician: Betha Loa Referring Provider: Treating Provider/Extender: Lorna Dibble Weeks in Treatment: 20 Subjective Chief Complaint Information obtained from Patient Left foot pressure ulcer and right 4th toe ulcer History of Present Illness (HPI) 02-22-2022 upon evaluation today patient appears to be doing somewhat poorly in regard to her left medial first metatarsal head where she does have a bunion. Unfortunately this is causing some pressure and has led to a pressure injury at this site. Fortunately I do not see any signs of infection at this point but that is something we definitely can need to keep a close eye on. Fortunately there does not appear to be any signs of active infection locally nor systemically at this point. No fevers, chills, nausea, vomiting, or diarrhea. Patient does have a history of of vascular dementia, COPD, congestive heart failure, and the dementia does affect her ability to be able to in some cases follow directions. She is very nervous about things in general. This is stated to have started somewhere around January 22, 2022. 03-02-2022 upon evaluation today patient appears to  be doing well currently in regard to her wounds all things considered. Fortunately I do not see any signs of infection unfortunately both  wounds are still open and the one on the medial foot first metatarsal location is still the worst. Although the area over the fifth toe right foot actually is also open at this point unfortunately. With that being said I do think we can need to continue to monitor for any signs of worsening or infection although right now I think she is doing okay in that regard. 3/4; this is a patient with a bunion wound on the left medial foot. She also had a small blister on the right lateral fifth toe. She went for x-rays last week no acute fracture or dislocation was seen on the left side she had worsened moderate hallucis valgus and moderate second and third fourth fifth tarsometatarsal osteoarthritis. We use Prisma. 3/11; patient presents for follow-up. She has been using endoform to the left medial foot wound. She has cushion pads that she uses in between the toes to help offload the areas and prevent future wounds. The right lateral fifth met head remains closed. 03-22-2022 upon evaluation today patient appears to be doing well currently in regard to her wound. She does show some signs of improvement here. She is also showing some signs of being a little bit too moist I think we may want to cut back on the hydrogel possibly just using the saline only at this point she is in agreement with that plan. Fortunately I do not see any evidence of active infection locally nor systemically which is great news. No fevers, chills, nausea, vomiting, or diarrhea. 03-29-22 upon evaluation today patient actually appears to be making excellent progress and very pleased with how things appear and how the wound is doing. I do think that we are moving in the right direction and very pleased in that regard. No fevers, chills, nausea, vomiting, or diarrhea. 04-05-2022 upon evaluation today patient  appears to be doing well currently in regard to her wound. This is actually showing signs of improvement. Fortunately there does not appear to be any signs of active infection locally nor systemically at this time which is great news. No fevers, chills, nausea, vomiting, or diarrhea. 04-12-2022 upon evaluation today patient's wound actually showing signs of some improvement. Fortunately there does not appear to be any evidence of active infection locally nor systemically which is great news. No fevers, chills, nausea, vomiting, or diarrhea. 04-19-2022 upon evaluation today patient actually appears to be doing quite well in regard to her wound. She has been tolerating the dressing changes without complication. Fortunately there does not appear to be any signs of active infection locally or systemically which is great news. No fevers, chills, nausea, vomiting, or diarrhea. 05-03-2022 upon evaluation today patient appears to be doing better in regard to her foot ulcer this is actually very close to complete resolution. Fortunately I do not see any signs of active infection locally nor systemically. 05-10-2022 upon evaluation today patient appears to be doing well currently in regard to her wound there is some need for sharp debridement but minimal. 05-17-2022 upon evaluation today patient appears to be doing well currently in regard to her foot ulcer. This is still continue to make good progress. Fortunately there does not appear to be any signs of active infection locally nor systemically 05-24-2022 upon patient today patient actually appears to be doing to her wound. This seems to be making some slow I am extremely pleased with where we stand currently. I do not see any signs of infection locally nor systemically which is great news. 06-01-2022  upon evaluation today patient's wound actually appears to be doing excellent. I do not see any signs of active infection at this time which is great news and overall I  think she is actually very close to complete resolution. I am extremely pleased with where things stand today. In general I think that were moving appropriately towards closure. KEDRA, TAMAS ([id number]) [id number] Page 6 of 8 06-07-2022 upon evaluation today patient appears to be doing well currently in regard to her wound. This is still a little bit open we were hoping that it might be closed but again this does not appear to be the case. Fortunately I do not see any signs of worsening I think she is doing well in general however 06-15-2022 upon evaluation today patient appears to be doing better in regard to the wound on her left foot although she does have a new wound on her right foot at her toe and this is a pressure ulcer at this time location. With that being said I do not see any signs of infection here obvious at the moment but again this does appear to be somewhat painful for her and again this is new even compared to last week when I last saw her. The left foot seems to be doing much better. 06-25-2022 patient's left flank still continues to show signs of improvement. Fortunately I do not see anything open. The fourth toe right foot actually showed signs of some issues here although it looks better than last week I am actually very pleased with where things stand. 06-29-2022 upon evaluation today patient appears to be doing well currently in regard to her left foot which is still healed. In regard to the right foot we are still continuing to utilize the [facility] at this point. With that being said I do believe that the patient is going to need to switch to something else. I would recommend that we attempt to do the endoform which is done well for the left foot. 07-12-2022 upon evaluation today patient appears to be doing poorly in regard to her toes which all appear to be somewhat red whether being padded between 1 to another I am not exactly sure what is going  on here I am not 100% convinced this is infection but I cannot be 100% it is not either. With that being said I do think that there is at least a chance of a urinary tract infection here based on the symptoms the patient is experiencing I would not put that out of the question she has been a little bit more confused much more belligerent according to her daughter and again her dementia seems to be overall just worse in general. She also been urinating more than normal which is unusual as far as having accidents is concerned. Objective Constitutional Well-nourished and well-hydrated in no acute distress. Vitals Time Taken: 12:56 PM, Weight: 105 lbs, Temperature: 97.8 F, Pulse: 84 bpm, Respiratory Rate: 16 breaths/min, Blood Pressure: 128/84 mmHg. Respiratory normal breathing without difficulty. Psychiatric this patient is able to make decisions and demonstrates good insight into disease process. Alert and Oriented x 3. pleasant and cooperative. General Notes: Upon inspection patient's wound bed actually showed signs of good granulation epithelization at this point. Fortunately there does not appear to be any evidence of active infection locally or systemically which is great news and in general I do believe that we are making good headway towards complete closure. Integumentary (Hair, Skin) Wound #3 status is Open.  Original cause of wound was Pressure Injury. The date acquired was: 06/15/2022. The wound has been in treatment 3 weeks. The wound is located on the Right T Fourth. The wound measures 0.2cm length x 0.2cm width x 0.2cm depth; 0.031cm^2 area and 0.006cm^3 volume. There is a oe medium amount of serosanguineous drainage noted. Assessment Active Problems ICD-10 Pressure ulcer of other site, stage 3 Non-pressure chronic ulcer of other part of right foot with fat layer exposed Chronic combined systolic (congestive) and diastolic (congestive) heart failure Vascular dementia, unspecified  severity, with agitation Chronic obstructive pulmonary disease, unspecified Plan Follow-up Appointments: Return Appointment in 1 week. Bathing/ Shower/ Hygiene: Wash wounds with antibacterial soap and water. Anesthetic (Use 'Patient Medications' Section for Anesthetic Order Entry): Lidocaine applied to wound bed Edema Control - Lymphedema / Segmental Compressive Device / Other: Elevate, Exercise Daily and Avoid Standing for Long Periods of Time. Elevate legs to the level of the heart and pump ankles as often as possible Elevate leg(s) parallel to the floor when sitting. WOUND #3: - T Fourth Wound Laterality: Right oe Cleanser: Byram Ancillary Kit - 15 Day Supply (Generic) 3 x Per Week/30 Days Discharge Instructions: Use supplies as instructed; Kit contains: (15) Saline Bullets; (15) 3x3 Gauze; 15 pr Gloves [place]olt, Anika L ([id number]) [id number] Page 7 of 8 Cleanser: Soap and Water 3 x Per Week/30 Days Discharge Instructions: Gently cleanse wound with antibacterial soap, rinse and pat dry prior to dressing wounds Prim Dressing: Endoform Natural, Non-fenestrated, 2x2 (in/in) 3 x Per Week/30 Days ary Secondary Dressing: Coverlet Latex-Free Fabric Adhesive Dressings 3 x Per Week/30 Days Discharge Instructions: Knuckle 1. Unfortunately the patient has a lot of red areas in between her toes none of these are really open at this point but I am unsure why they are attempting to breakdown she has been having these padded as normal and I do not see any real reason for this. I do not think that she is obviously infected I do not believe that there is anything from the wounds on her feet that would be causing her to be septic. Nonetheless I do not know exactly what is happening at this point. 2. Based on what I am seeing I do believe that the endoform to be continued for the open wound at this point. 3. Also can recommend a coverlet to go over. 4. I am also can recommend  continued use of the lambswool between the toes. 5. Based on the fact that she is having increased urination coupled with confusion and just overall not feeling as well she even does not act normal for her I think the patient may have at least potentially urinary tract infection I think this needs to be checked before I attempt any antibiotic therapy I will go ahead and have them go to urgent care to have this checked and then depending on what urgent care does for the UTI we will see if there is anything I want to do different for the feet as far as wounds are concerned. We will see patient back for reevaluation in 1 week here in the clinic. If anything worsens or changes patient will contact our office for additional recommendations. Electronic Signature(s) Signed: 07/12/2022 1:49:55 PM By: Allen Derry PA-C Entered By: Allen Derry on 07/12/2022 13:49:55 -------------------------------------------------------------------------------- SuperBill Details Patient Name: Date of Service: [name], Dilcia L. 07/12/2022 Medical Record Number: [id number] Patient Account Number: [id number] Date of Birth/Sex: Treating RN: 1934/06/14 (88 y.o. [name] Primary Care Provider:  Marylynn Pearson Other Clinician: Betha Loa Referring Provider: Treating Provider/Extender: Lorna Dibble Weeks in Treatment: 20 Diagnosis Coding ICD-10 Codes Code Description [phone] Pressure ulcer of other site, stage 3 L97.512 Non-pressure chronic ulcer of other part of right foot with fat layer exposed I50.42 Chronic combined systolic (congestive) and diastolic (congestive) heart failure F01.511 Vascular dementia, unspecified severity, with agitation J44.9 Chronic obstructive pulmonary disease, unspecified Facility Procedures : CPT4 Code: 04540981 Description: 19147 - WOUND CARE VISIT-LEV 2 EST PT Modifier: Quantity: 1 Physician Procedures Electronic Signature(s) Signed: 07/12/2022 1:50:37 PM By:  Allen Derry PA-C Entered By: Allen Derry on 07/12/2022 13:50:37

## 2022-07-12 NOTE — ED Notes (Signed)
Pt called for triage but states they are going to go home and come back tomorrow.

## 2022-07-15 ENCOUNTER — Ambulatory Visit: Payer: Medicare Other | Admitting: Physician Assistant

## 2022-07-19 ENCOUNTER — Encounter: Payer: Medicare Other | Admitting: Physician Assistant

## 2022-07-19 DIAGNOSIS — L89893 Pressure ulcer of other site, stage 3: Secondary | ICD-10-CM | POA: Diagnosis not present

## 2022-07-19 NOTE — Progress Notes (Addendum)
ELEXIUS, BURRIER ([id number]) [id number] Page 1 of 8 Visit Report for 07/19/2022 Arrival Information Details Patient Name: Date of Service: [name], [name] 07/19/2022 3:45 PM Medical Record Number: [id number] Patient Account Number: [id number] Date of Birth/Sex: Treating RN: 05-18-1934 (88 y.o. [name] Primary Care : Marylynn Pearson Other Clinician: Betha Loa Referring : Treating /Extender: Lorna Dibble Weeks in Treatment: 21 Visit Information History Since Last Visit All ordered tests and consults were completed: No Patient Arrived: Wheel Chair Added or deleted any medications: No Arrival Time: 16:19 Any new allergies or adverse reactions: No Transfer Assistance: EasyPivot Patient Lift Had a fall or experienced change in No Patient Identification Verified: Yes activities of daily living that may affect Secondary Verification Process Completed: Yes risk of falls: Patient Requires Transmission-Based Precautions: No Signs or symptoms of abuse/neglect since last visito No Patient Has Alerts: No Hospitalized since last visit: No Implantable device outside of the clinic excluding No cellular tissue based products placed in the center since last visit: Has Dressing in Place as Prescribed: Yes Pain Present Now: No Electronic Signature(s) Signed: 07/19/2022 4:35:54 PM By: Betha Loa Entered By: Betha Loa on 07/19/2022 16:20:38 -------------------------------------------------------------------------------- Clinic Level of Care Assessment Details Patient Name: Date of Service: [name], [name] 07/19/2022 3:45 PM Medical Record Number: [id number] Patient Account Number: [id number] Date of Birth/Sex: Treating RN: August 08, 1934 (88 y.o. [name] Primary Care : Marylynn Pearson Other Clinician: Betha Loa Referring : Treating /Extender: Lorna Dibble Weeks in Treatment: 21 Clinic Level of Care Assessment Items TOOL 4 Quantity Score []  - 0 Use when only an EandM [name] performed on FOLLOW-UP visit ASSESSMENTS - Nursing Assessment / Reassessment X- 1 10 Reassessment of Co-morbidities (includes updates in patient status) X- 1 5 Reassessment of Adherence to Treatment Plan Easterly, Betsey L ([id number]) [id number] Page 2 of 8 ASSESSMENTS - Wound and Skin A ssessment / Reassessment X - Simple Wound Assessment / Reassessment - one wound 1 5 []  - 0 Complex Wound Assessment / Reassessment - multiple wounds []  - 0 Dermatologic / Skin Assessment (not related to wound area) ASSESSMENTS - Focused Assessment []  - 0 Circumferential Edema Measurements - multi extremities []  - 0 Nutritional Assessment / Counseling / Intervention []  - 0 Lower Extremity Assessment (monofilament, tuning fork, pulses) []  - 0 Peripheral Arterial Disease Assessment (using hand held doppler) ASSESSMENTS - Ostomy and/or Continence Assessment and Care []  - 0 Incontinence Assessment and Management []  - 0 Ostomy Care Assessment and Management (repouching, etc.) PROCESS - Coordination of Care X - Simple Patient / Family Education for ongoing care 1 15 []  - 0 Complex (extensive) Patient / Family Education for ongoing care []  - 0 Staff obtains Chiropractor, Records, T Results / Process Orders est []  - 0 Staff telephones HHA, Nursing Homes / Clarify orders / etc []  - 0 Routine Transfer to another Facility (non-emergent condition) []  - 0 Routine Hospital Admission (non-emergent condition) []  - 0 New Admissions / Manufacturing engineer / Ordering NPWT Apligraf, etc. , []  - 0 Emergency Hospital Admission (emergent condition) X- 1 10 Simple Discharge Coordination []  - 0 Complex (extensive) Discharge Coordination PROCESS - Special Needs []  - 0 Pediatric / Minor Patient Management []  - 0 Isolation Patient Management []  - 0 Hearing /  Language / Visual special needs []  - 0 Assessment of Community assistance (transportation, D/C planning, etc.) []  - 0 Additional assistance / Altered mentation []  - 0 Support Surface(s) Assessment (bed, cushion, seat,  etc.) INTERVENTIONS - Wound Cleansing / Measurement X - Simple Wound Cleansing - one wound 1 5 []  - 0 Complex Wound Cleansing - multiple wounds X- 1 5 Wound Imaging (photographs - any number of wounds) []  - 0 Wound Tracing (instead of photographs) X- 1 5 Simple Wound Measurement - one wound []  - 0 Complex Wound Measurement - multiple wounds INTERVENTIONS - Wound Dressings []  - 0 Small Wound Dressing one or multiple wounds X- 1 15 Medium Wound Dressing one or multiple wounds []  - 0 Large Wound Dressing one or multiple wounds []  - 0 Application of Medications - topical []  - 0 Application of Medications - injection INTERVENTIONS - Miscellaneous []  - 0 External ear exam Stary, Verenise L ([id number]) [id number] Page 3 of 8 []  - 0 Specimen Collection (cultures, biopsies, blood, body fluids, etc.) []  - 0 Specimen(s) / Culture(s) sent or taken to Lab for analysis []  - 0 Patient Transfer (multiple staff / Michiel Sites Lift / Similar devices) []  - 0 Simple Staple / Suture removal (25 or less) []  - 0 Complex Staple / Suture removal (26 or more) []  - 0 Hypo / Hyperglycemic Management (close monitor of Blood Glucose) []  - 0 Ankle / Brachial Index (ABI) - do not check if billed separately X- 1 5 Vital Signs Has the patient been seen at the hospital within the last three years: Yes Total Score: 80 Level Of Care: New/Established - Level 3 Electronic Signature(s) Signed: 07/19/2022 5:21:06 PM By: Betha Loa Entered By: Betha Loa on 07/19/2022 17:00:21 -------------------------------------------------------------------------------- Encounter Discharge Information Details Patient Name: Date of Service: [name], [name] 07/19/2022 3:45  PM Medical Record Number: [id number] Patient Account Number: [id number] Date of Birth/Sex: Treating RN: 1934/03/05 (88 y.o. [name] Primary Care : Marylynn Pearson Other Clinician: Betha Loa Referring : Treating /Extender: Lorna Dibble Weeks in Treatment: 21 Encounter Discharge Information Items Discharge Condition: Stable Ambulatory Status: Wheelchair Discharge Destination: Home Transportation: Private Auto Accompanied By: daughter Schedule Follow-up Appointment: Yes Clinical Summary of Care: Electronic Signature(s) Signed: 07/19/2022 5:21:06 PM By: Betha Loa Entered By: Betha Loa on 07/19/2022 17:16:18 Lower Extremity Assessment Details -------------------------------------------------------------------------------- [name] ([id number]) [id number] Page 4 of 8 Patient Name: Date of Service: [name], [name] 07/19/2022 3:45 PM Medical Record Number: [id number] Patient Account Number: [id number] Date of Birth/Sex: Treating RN: Mar 10, 1934 (88 y.o. [name] Primary Care : Marylynn Pearson Other Clinician: Betha Loa Referring : Treating /Extender: Lorna Dibble Weeks in Treatment: 21 Electronic Signature(s) Signed: 07/19/2022 4:35:54 PM By: Betha Loa Signed: 07/22/2022 3:42:23 PM By: Yevonne Pax RN Entered By: Betha Loa on 07/19/2022 16:29:24 -------------------------------------------------------------------------------- Multi Wound Chart Details Patient Name: Date of Service: [name], [name] 07/19/2022 3:45 PM Medical Record Number: [id number] Patient Account Number: [id number] Date of Birth/Sex: Treating RN: [birth year] (88 y.o. [name] Primary Care : Marylynn Pearson Other Clinician: Betha Loa Referring : Treating /Extender: Lorna Dibble Weeks in Treatment: 21 Vital  Signs Height(in): Pulse(bpm): 83 Weight(lbs): 105 Blood Pressure(mmHg): 117/85 Body Mass Index(BMI): Temperature(F): 97.7 Respiratory Rate(breaths/min): 16 [3:Photos:] [N/A:N/A] Right T Fourth oe N/A N/A Wound Location: Pressure Injury N/A N/A Wounding Event: Pressure Ulcer N/A N/A Primary Etiology: Chronic Obstructive Pulmonary N/A N/A Comorbid History: Disease (COPD), Congestive Heart Failure, Dementia 06/15/2022 N/A N/A Date Acquired: 4 N/A N/A Weeks of Treatment: Open N/A N/A Wound Status: No N/A N/A Wound Recurrence: 0.4x0.3x0.1 N/A N/A Measurements L x W x D (cm) 0.094 N/A  N/A A (cm) : rea 0.009 N/A N/A Volume (cm) : 71.50% N/A N/A % Reduction in A rea: 86.40% N/A N/A % Reduction in Volume: Category/Stage III N/A N/A Classification: Medium N/A N/A Exudate A mount: Serosanguineous N/A N/A Exudate Type: red, brown N/A N/A Exudate Color: None N/A N/A Epithelialization: Treatment Notes Baskett, Demiah L ([id number]) [id number] Page 5 of 8 Electronic Signature(s) Signed: 07/19/2022 4:35:54 PM By: Betha Loa Entered By: Betha Loa on 07/19/2022 16:29:32 -------------------------------------------------------------------------------- Multi-Disciplinary Care Plan Details Patient Name: Date of Service: [name], [name] 07/19/2022 3:45 PM Medical Record Number: [id number] Patient Account Number: [id number] Date of Birth/Sex: Treating RN: 28-Apr-1934 (88 y.o. [name] Primary Care : Marylynn Pearson Other Clinician: Betha Loa Referring : Treating /Extender: Lorna Dibble Weeks in Treatment: 21 Active Inactive Electronic Signature(s) Signed: 08/15/2022 5:33:15 PM By: Elliot Gurney, BSN, RN, CWS, [name] RN, BSN Signed: 08/19/2022 8:04:04 AM By: Yevonne Pax RN Previous Signature: 07/19/2022 5:21:06 PM Version By: Betha Loa Previous Signature: 07/22/2022 3:42:23 PM Version By: Yevonne Pax RN Entered By: Elliot Gurney BSN, RN, CWS, [name] on 08/15/2022 17:33:14 -------------------------------------------------------------------------------- Pain Assessment Details Patient Name: Date of Service: [name], [name] 07/19/2022 3:45 PM Medical Record Number: [id number] Patient Account Number: [id number] Date of Birth/Sex: Treating RN: [birth year] (88 y.o. [name] Primary Care : Marylynn Pearson Other Clinician: Betha Loa Referring : Treating /Extender: Lorna Dibble Weeks in Treatment: 21 Active Problems Location of Pain Severity and Description of Pain Patient Has Paino No Site Locations [place], [place] L ([id number]) [id number].pdf Page 6 of 8 Pain Management and Medication Current Pain Management: Electronic Signature(s) Signed: 07/19/2022 4:35:54 PM By: Betha Loa Signed: 07/22/2022 3:42:23 PM By: Yevonne Pax RN Entered By: Betha Loa on 07/19/2022 16:28:43 -------------------------------------------------------------------------------- Patient/Caregiver Education Details Patient Name: Date of Service: [name], [name] 7/15/2024andnbsp3:45 PM Medical Record Number: [id number] Patient Account Number: [id number] Date of Birth/Gender: Treating RN: 1934-04-12 (88 y.o. [name] Primary Care Physician: Marylynn Pearson Other Clinician: Betha Loa Referring Physician: Treating Physician/Extender: Germain Osgood in Treatment: 21 Education Assessment Education Provided To: Caregiver Education Topics Provided Wound/Skin Impairment: Handouts: Other: continue wound care as directed Methods: Explain/Verbal Responses: State content correctly Electronic Signature(s) Signed: 07/19/2022 5:21:06 PM By: Betha Loa Entered By: Betha Loa on 07/19/2022 17:15:43 Meinzer, Mercedez L ([id number]) [id number] Page 7 of  8 -------------------------------------------------------------------------------- Wound Assessment Details Patient Name: Date of Service: [name], [name] 07/19/2022 3:45 PM Medical Record Number: [id number] Patient Account Number: [id number] Date of Birth/Sex: Treating RN: 1934-01-31 (88 y.o. [name] Primary Care : Marylynn Pearson Other Clinician: Betha Loa Referring : Treating /Extender: Lorna Dibble Weeks in Treatment: 21 Wound Status Wound Number: 3 Primary Pressure Ulcer Etiology: Wound Location: Right T Fourth oe Wound Open Wounding Event: Pressure Injury Status: Date Acquired: 06/15/2022 Comorbid Chronic Obstructive Pulmonary Disease (COPD), Congestive Weeks Of Treatment: 4 History: Heart Failure, Dementia Clustered Wound: No Photos Wound Measurements Length: (cm) 0.4 Width: (cm) 0.3 Depth: (cm) 0.1 Area: (cm) 0.094 Volume: (cm) 0.009 % Reduction in Area: 71.5% % Reduction in Volume: 86.4% Epithelialization: None Wound Description Classification: Category/Stage III Exudate Amount: Medium Exudate Type: Serosanguineous Exudate Color: red, brown Foul Odor After Cleansing: No Slough/Fibrino Yes Electronic Signature(s) Signed: 07/19/2022 4:35:54 PM By: Betha Loa Signed: 07/22/2022 3:42:23 PM By: Yevonne Pax RN Entered By: Betha Loa on 07/19/2022 16:29:17 [place]ack[place]ford, Jimya L ([id number]) [id number] Page 8 of 8 -------------------------------------------------------------------------------- Vitals Details Patient Name:  Date of Service: [name], [name] 07/19/2022 3:45 PM Medical Record Number: [id number] Patient Account Number: [id number] Date of Birth/Sex: Treating RN: 09-May-1934 (88 y.o. [name] Primary Care : Marylynn Pearson Other Clinician: Betha Loa Referring : Treating /Extender: Lorna Dibble Weeks in Treatment:  21 Vital Signs Time Taken: 16:20 Temperature (F): 97.7 Weight (lbs): 105 Pulse (bpm): 83 Respiratory Rate (breaths/min): 16 Blood Pressure (mmHg): 117/85 Reference Range: 80 - 120 mg / dl Electronic Signature(s) Signed: 07/19/2022 4:35:54 PM By: Betha Loa Entered By: Betha Loa on 07/19/2022 16:28:36

## 2022-07-19 NOTE — Progress Notes (Signed)
Lacey, [name] ([id number]) [id number] Page 1 of 8 Visit Report for 07/19/2022 Chief Complaint Document Details Patient Name: Date of Service: [name] IS, [name] 07/19/2022 3:45 PM Medical Record Number: [id number] Patient Account Number: [id number] Date of Birth/Sex: Treating RN: 1934-06-18 (88 y.o. [name] Primary Care Provider: Marylynn Pearson Other Clinician: Betha Loa Referring Provider: Treating Provider/Extender: Lorna Dibble Weeks in Treatment: 21 Information Obtained from: Patient Chief Complaint Left foot pressure ulcer and right 4th toe ulcer Electronic Signature(s) Signed: 07/19/2022 3:54:52 PM By: Allen Derry PA-C Entered By: Allen Derry on 07/19/2022 15:54:52 -------------------------------------------------------------------------------- HPI Details Patient Name: Date of Service: [name], [name] 07/19/2022 3:45 PM Medical Record Number: [id number] Patient Account Number: [id number] Date of Birth/Sex: Treating RN: 05-Dec-1934 (88 y.o. [name] Primary Care Provider: Marylynn Pearson Other Clinician: Betha Loa Referring Provider: Treating Provider/Extender: Lorna Dibble Weeks in Treatment: 21 History of Present Illness HPI Description: 02-22-2022 upon evaluation today patient appears to be doing somewhat poorly in regard to her left medial first metatarsal head where she does have a bunion. Unfortunately this is causing some pressure and has led to a pressure injury at this site. Fortunately I do not see any signs of infection at this point but that is something we definitely can need to keep a close eye on. Fortunately there does not appear to be any signs of active infection locally nor systemically at this point. No fevers, chills, nausea, vomiting, or diarrhea. Patient does have a history of of vascular dementia, COPD, congestive heart failure, and the dementia does affect her ability  to be able to in some cases follow directions. She is very nervous about things in general. This is stated to have started somewhere around January 22, 2022. 03-02-2022 upon evaluation today patient appears to be doing well currently in regard to her wounds all things considered. Fortunately I do not see any signs of infection unfortunately both wounds are still open and the one on the medial foot first metatarsal location is still the worst. Although the area over the fifth toe right foot actually is also open at this point unfortunately. With that being said I do think we can need to continue to monitor for any signs of worsening or infection although right now I think she is doing okay in that regard. 3/4; this is a patient with a bunion wound on the left medial foot. She also had a small blister on the right lateral fifth toe. She went for x-rays last week no acute fracture or dislocation was seen on the left side she had worsened moderate hallucis valgus and moderate second and third fourth fifth tarsometatarsal osteoarthritis. We use Prisma. 3/11; patient presents for follow-up. She has been using endoform to the left medial foot wound. She has cushion pads that she uses in between the toes to Zeck, Dava L ([id number]) [phone].pdf Page 2 of 8 help offload the areas and prevent future wounds. The right lateral fifth met head remains closed. 03-22-2022 upon evaluation today patient appears to be doing well currently in regard to her wound. She does show some signs of improvement here. She is also showing some signs of being a little bit too moist I think we may want to cut back on the hydrogel possibly just using the saline only at this point she is in agreement with that plan. Fortunately I do not see any evidence of active infection locally nor systemically which is great news. No  fevers, chills, nausea, vomiting, or diarrhea. 03-29-22 upon evaluation today patient  actually appears to be making excellent progress and very pleased with how things appear and how the wound is doing. I do think that we are moving in the right direction and very pleased in that regard. No fevers, chills, nausea, vomiting, or diarrhea. 04-05-2022 upon evaluation today patient appears to be doing well currently in regard to her wound. This is actually showing signs of improvement. Fortunately there does not appear to be any signs of active infection locally nor systemically at this time which is great news. No fevers, chills, nausea, vomiting, or diarrhea. 04-12-2022 upon evaluation today patient's wound actually showing signs of some improvement. Fortunately there does not appear to be any evidence of active infection locally nor systemically which is great news. No fevers, chills, nausea, vomiting, or diarrhea. 04-19-2022 upon evaluation today patient actually appears to be doing quite well in regard to her wound. She has been tolerating the dressing changes without complication. Fortunately there does not appear to be any signs of active infection locally or systemically which is great news. No fevers, chills, nausea, vomiting, or diarrhea. 05-03-2022 upon evaluation today patient appears to be doing better in regard to her foot ulcer this is actually very close to complete resolution. Fortunately I do not see any signs of active infection locally nor systemically. 05-10-2022 upon evaluation today patient appears to be doing well currently in regard to her wound there is some need for sharp debridement but minimal. 05-17-2022 upon evaluation today patient appears to be doing well currently in regard to her foot ulcer. This is still continue to make good progress. Fortunately there does not appear to be any signs of active infection locally nor systemically 05-24-2022 upon patient today patient actually appears to be doing to her wound. This seems to be making some slow I am extremely pleased  with where we stand currently. I do not see any signs of infection locally nor systemically which is great news. 06-01-2022 upon evaluation today patient's wound actually appears to be doing excellent. I do not see any signs of active infection at this time which is great news and overall I think she is actually very close to complete resolution. I am extremely pleased with where things stand today. In general I think that were moving appropriately towards closure. 06-07-2022 upon evaluation today patient appears to be doing well currently in regard to her wound. This is still a little bit open we were hoping that it might be closed but again this does not appear to be the case. Fortunately I do not see any signs of worsening I think she is doing well in general however 06-15-2022 upon evaluation today patient appears to be doing better in regard to the wound on her left foot although she does have a new wound on her right foot at her toe and this is a pressure ulcer at this time location. With that being said I do not see any signs of infection here obvious at the moment but again this does appear to be somewhat painful for her and again this is new even compared to last week when I last saw her. The left foot seems to be doing much better. 06-25-2022 patient's left flank still continues to show signs of improvement. Fortunately I do not see anything open. The fourth toe right foot actually showed signs of some issues here although it looks better than last week I am actually very pleased  with where things stand. 06-29-2022 upon evaluation today patient appears to be doing well currently in regard to her left foot which is still healed. In regard to the right foot we are still continuing to utilize the [facility] at this point. With that being said I do believe that the patient is going to need to switch to something else. I would recommend that we attempt to do the endoform which is done well for the  left foot. 07-12-2022 upon evaluation today patient appears to be doing poorly in regard to her toes which all appear to be somewhat red whether being padded between 1 to another I am not exactly sure what is going on here I am not 100% convinced this is infection but I cannot be 100% it is not either. With that being said I do think that there is at least a chance of a urinary tract infection here based on the symptoms the patient is experiencing I would not put that out of the question she has been a little bit more confused much more belligerent according to her daughter and again her dementia seems to be overall just worse in general. She also been urinating more than normal which is unusual as far as having accidents is concerned. 07-19-2022 upon evaluation today patient's wounds are showing signs of improvement. With that being said even though she improves in some ways there are ways that she continues to have pressure between the toes and I think that this point the primary concern here is simply the fact that the lambswool was just not providing enough protection for between the toes. Electronic Signature(s) Signed: 07/19/2022 5:51:36 PM By: Allen Derry PA-C Entered By: Allen Derry on 07/19/2022 17:51:35 -------------------------------------------------------------------------------- Physical Exam Details Patient Name: Date of Service: [name], Arella L. 07/19/2022 3:45 PM Medical Record Number: [id number] Patient Account Number: [id number] Date of Birth/Sex: Treating RN: [birth year] (88 y.o. [name] Primary Care Provider: Marylynn Pearson Other Clinician: Betha Loa Referring Provider: Treating Provider/Extender: Lorna Dibble [place], [place] ([id number]) [id number] Page 3 of 8 Weeks in Treatment: 21 Constitutional Well-nourished and well-hydrated in no acute distress. Respiratory normal breathing without  difficulty. Psychiatric this patient is able to make decisions and demonstrates good insight into disease process. Alert and Oriented x 3. pleasant and cooperative. Notes Upon inspection patient's wound on the fourth toe right foot actually appears to be doing better the fifth toe is showing signs of wanting to try to open here and that has any more concerns. Fortunately I do not see any signs of active infection systemically which is good news. We did put her on antibiotics last week I am not sure it has really made much of a difference to be perfectly honest. Electronic Signature(s) Signed: 07/19/2022 5:51:46 PM By: Allen Derry PA-C Entered By: Allen Derry on 07/19/2022 17:51:46 -------------------------------------------------------------------------------- Physician Orders Details Patient Name: Date of Service: [name], Nakeeta L. 07/19/2022 3:45 PM Medical Record Number: [id number] Patient Account Number: [id number] Date of Birth/Sex: Treating RN: Feb 10, 1934 (88 y.o. 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 Bathing/ [employer] wounds with antibacterial soap and water. Anesthetic (Use 'Patient Medications' Section for Anesthetic Order Entry) Lidocaine applied to wound bed Edema Control - Lymphedema / Segmental  Compressive Device / Other Elevate, Exercise Daily and A void Standing for Long Periods of Time. Elevate legs to the level of the heart and pump ankles as often as possible Elevate leg(s) parallel to the floor when sitting. Additional Orders / Instructions Other: - use foam pads between toes Wound Treatment Wound #3 - T Fourth oe Wound Laterality: Right Cleanser: [employer] Ancillary Kit - 15 Day Supply (Generic) 3 x Per Week/30 Days Casserly, Suly L ([id number]) [id number] Page 4 of 8 Discharge Instructions: Use supplies as instructed; Kit contains: (15) Saline Bullets; (15) 3x3 Gauze; 15 pr Gloves Cleanser: Soap and Water 3 x Per Week/30 Days Discharge Instructions: Gently cleanse wound with antibacterial soap, rinse and pat dry prior to dressing wounds Prim Dressing: Endoform Natural, Non-fenestrated, 2x2 (in/in) ary 3 x Per Week/30 Days Secondary Dressing: Coverlet Latex-Free Fabric Adhesive Dressings 3 x Per Week/30 Days Discharge Instructions: [place]uckle Electronic Signature(s) Signed: 07/19/2022 5:21:06 PM By: Betha Loa Signed: 07/20/2022 5:59:11 PM By: Allen Derry PA-C Entered By: Betha Loa on 07/19/2022 16:59:56 -------------------------------------------------------------------------------- Problem List Details Patient Name: Date of Service: [name], Nikya L. 07/19/2022 3:45 PM Medical Record Number: [id number] Patient Account Number: [id number] Date of Birth/Sex: Treating RN: [birth year] (88 y.o. [name] Primary Care Provider: Marylynn Pearson Other Clinician: Betha Loa Referring Provider: Treating Provider/Extender: Lorna Dibble Weeks in Treatment: 21 Active Problems ICD-10 Encounter Code Description Active Date MDM Diagnosis L89.893 Pressure ulcer of other site, stage 3 02/22/2022 No Yes L97.512 Non-pressure chronic ulcer of other part of right foot with fat layer exposed 06/15/2022 No Yes I50.42 Chronic combined  systolic (congestive) and diastolic (congestive) heart failure 02/22/2022 No Yes F01.511 Vascular dementia, unspecified severity, with agitation 02/22/2022 No Yes J44.9 Chronic obstructive pulmonary disease, unspecified 02/22/2022 No Yes Inactive Problems Resolved Problems Electronic Signature(s) Signed: 07/19/2022 3:54:50 PM By: Allen Derry PA-C Entered By: Allen Derry on 07/19/2022 15:54:50 Wolken, Tomeshia L ([id number]) [id number] Page 5 of 8 -------------------------------------------------------------------------------- Progress Note Details Patient Name: Date of Service: [name], Charese L. 07/19/2022 3:45 PM Medical Record Number: [id number] Patient Account Number: [id number] Date of Birth/Sex: Treating RN: 1934/07/26 (88 y.o. [name] Primary Care Provider: Marylynn Pearson Other Clinician: Betha Loa Referring Provider: Treating Provider/Extender: Lorna Dibble Weeks in Treatment: 21 Subjective Chief Complaint Information obtained from Patient Left foot pressure ulcer and right 4th toe ulcer History of Present Illness (HPI) 02-22-2022 upon evaluation today patient appears to be doing somewhat poorly in regard to her left medial first metatarsal head where she does have a bunion. Unfortunately this is causing some pressure and has led to a pressure injury at this site. Fortunately I do not see any signs of infection at this point but that is something we definitely can need to keep a close eye on. Fortunately there does not appear to be any signs of active infection locally nor systemically at this point. No fevers, chills, nausea, vomiting, or diarrhea. Patient does have a history of of vascular dementia, COPD, congestive heart failure, and the dementia does affect her ability to be able to in some cases follow directions. She is very nervous about things in general. This is stated to have started  somewhere around January 22, 2022. 03-02-2022 upon evaluation today patient appears to be doing well currently in regard to her wounds all things considered. Fortunately I do not see any signs of infection unfortunately both wounds are still open and the one on the medial foot first metatarsal location is still the worst. Although the area over the fifth toe right foot actually is also open at this point unfortunately. With that being said I do think we can need to continue to monitor for any signs of worsening or infection although right now I think she is doing okay in that regard. 3/4; this is a patient with a bunion wound on the left medial foot. She also had a small blister on the right lateral fifth toe. She went for x-rays last week no acute fracture or dislocation was seen on the left side she had worsened moderate hallucis valgus and moderate second and third fourth fifth tarsometatarsal osteoarthritis. We use Prisma. 3/11; patient presents for follow-up. She has been using endoform to the left medial foot wound. She has cushion pads that she uses in between the toes to help offload the areas and prevent future wounds. The right lateral fifth met head remains closed. 03-22-2022 upon evaluation today patient appears to be doing well currently in regard to her wound. She does show some signs of improvement here. She is also showing some signs of being a little bit too moist I think we may want to cut back on the hydrogel possibly just using the saline only at this point she is in agreement with that plan. Fortunately I do not see any evidence of active infection locally nor systemically which is great news. No fevers, chills, nausea, vomiting, or diarrhea. 03-29-22 upon evaluation today patient actually appears to be making excellent progress and very pleased with how things appear and how the wound is doing. I do think that we are moving in the right direction and very pleased in that regard. No fevers, chills, nausea,  vomiting, or diarrhea. 04-05-2022 upon evaluation today patient appears to be doing well currently in regard to her wound. This is actually showing signs of improvement. Fortunately there does not appear to be any signs of active infection locally nor systemically at this time which is great news. No fevers, chills, nausea, vomiting, or diarrhea. 04-12-2022 upon evaluation today patient's wound actually showing signs of some improvement. Fortunately there does not appear to be any evidence of active infection locally nor systemically which is great news. No fevers, chills, nausea, vomiting, or diarrhea. 04-19-2022 upon evaluation today patient actually appears to be doing quite well in regard to her wound. She has been tolerating the dressing changes without complication. Fortunately there does not appear to be any signs of active infection locally or systemically which is great news. No fevers, chills, nausea, vomiting, or diarrhea. 05-03-2022 upon evaluation today patient appears to be doing better in regard to her foot ulcer this is actually very close to complete resolution. Fortunately I do not see any signs of active infection locally nor systemically. 05-10-2022 upon evaluation today patient appears to be doing well currently in regard to her wound there is some need for sharp debridement but minimal. 05-17-2022 upon evaluation today patient appears to be doing well currently in regard to her foot ulcer. This is still continue to make good progress. Fortunately there does not appear to be any signs of active infection locally nor systemically 05-24-2022 upon patient today patient actually appears to be  doing to her wound. This seems to be making some slow I am extremely pleased with where we stand currently. I do not see any signs of infection locally nor systemically which is great news. 06-01-2022 upon evaluation today patient's wound actually appears to be doing excellent. I do not see any signs of  active infection at this time which is great news and overall I think she is actually very close to complete resolution. I am extremely pleased with where things stand today. In general I think that were moving appropriately towards closure. 06-07-2022 upon evaluation today patient appears to be doing well currently in regard to her wound. This is still a little bit open we were hoping that it might be closed but again this does not appear to be the case. Fortunately I do not see any signs of worsening I think she is doing well in general however Peasley, Grey L ([id number]) [phone].pdf Page 6 of 8 06-15-2022 upon evaluation today patient appears to be doing better in regard to the wound on her left foot although she does have a new wound on her right foot at her toe and this is a pressure ulcer at this time location. With that being said I do not see any signs of infection here obvious at the moment but again this does appear to be somewhat painful for her and again this is new even compared to last week when I last saw her. The left foot seems to be doing much better. 06-25-2022 patient's left flank still continues to show signs of improvement. Fortunately I do not see anything open. The fourth toe right foot actually showed signs of some issues here although it looks better than last week I am actually very pleased with where things stand. 06-29-2022 upon evaluation today patient appears to be doing well currently in regard to her left foot which is still healed. In regard to the right foot we are still continuing to utilize the [facility] at this point. With that being said I do believe that the patient is going to need to switch to something else. I would recommend that we attempt to do the endoform which is done well for the left foot. 07-12-2022 upon evaluation today patient appears to be doing poorly in regard to her toes which all appear to be somewhat red whether being  padded between 1 to another I am not exactly sure what is going on here I am not 100% convinced this is infection but I cannot be 100% it is not either. With that being said I do think that there is at least a chance of a urinary tract infection here based on the symptoms the patient is experiencing I would not put that out of the question she has been a little bit more confused much more belligerent according to her daughter and again her dementia seems to be overall just worse in general. She also been urinating more than normal which is unusual as far as having accidents is concerned. 07-19-2022 upon evaluation today patient's wounds are showing signs of improvement. With that being said even though she improves in some ways there are ways that she continues to have pressure between the toes and I think that this point the primary concern here is simply the fact that the lambswool was just not providing enough protection for between the toes. Objective Constitutional Well-nourished and well-hydrated in no acute distress. Vitals Time Taken: 4:20 PM, Weight: 105 lbs, Temperature: 97.7 F, Pulse:  83 bpm, Respiratory Rate: 16 breaths/min, Blood Pressure: 117/85 mmHg. Respiratory normal breathing without difficulty. Psychiatric this patient is able to make decisions and demonstrates good insight into disease process. Alert and Oriented x 3. pleasant and cooperative. General Notes: Upon inspection patient's wound on the fourth toe right foot actually appears to be doing better the fifth toe is showing signs of wanting to try to open here and that has any more concerns. Fortunately I do not see any signs of active infection systemically which is good news. We did put her on antibiotics last week I am not sure it has really made much of a difference to be perfectly honest. Integumentary (Hair, Skin) Wound #3 status is Open. Original cause of wound was Pressure Injury. The date acquired was: 06/15/2022.  The wound has been in treatment 4 weeks. The wound is located on the Right T Fourth. The wound measures 0.4cm length x 0.3cm width x 0.1cm depth; 0.094cm^2 area and 0.009cm^3 volume. There is a oe medium amount of serosanguineous drainage noted. Assessment Active Problems ICD-10 Pressure ulcer of other site, stage 3 Non-pressure chronic ulcer of other part of right foot with fat layer exposed Chronic combined systolic (congestive) and diastolic (congestive) heart failure Vascular dementia, unspecified severity, with agitation Chronic obstructive pulmonary disease, unspecified Plan Follow-up Appointments: Return Appointment in 1 week. Bathing/ Shower/ Hygiene: Wash wounds with antibacterial soap and water. Anesthetic (Use 'Patient Medications' Section for Anesthetic Order Entry): Lidocaine applied to wound bed Edema Control - Lymphedema / Segmental Compressive Device / Other: Elevate, Exercise Daily and Avoid Standing for Long Periods of Time. Elevate legs to the level of the heart and pump ankles as often as possible Elevate leg(s) parallel to the floor when sitting. Additional Orders / Instructions: Other: - use foam pads between toes WOUND #3: - T Fourth Wound Laterality: Right oe Weyman, Azelia L ([id number]) [id number] Page 7 of 8 Cleanser: Byram Ancillary Kit - 15 Day Supply (Generic) 3 x Per Week/30 Days Discharge Instructions: Use supplies as instructed; Kit contains: (15) Saline Bullets; (15) 3x3 Gauze; 15 pr Gloves Cleanser: Soap and Water 3 x Per Week/30 Days Discharge Instructions: Gently cleanse wound with antibacterial soap, rinse and pat dry prior to dressing wounds Prim Dressing: Endoform Natural, Non-fenestrated, 2x2 (in/in) 3 x Per Week/30 Days ary Secondary Dressing: Coverlet Latex-Free Fabric Adhesive Dressings 3 x Per Week/30 Days Discharge Instructions: Knuckle 1. I would recommend that we have the patient continue to monitor for any  signs of infection or worsening. With that being said I do think that the patient should follow-up for continual and appropriate and frequent evaluations in the clinic I would recommend weekly. With that being said she does not want to come next week as I will be here and she wants to just see me. 2. I am good recommend as well that the patient should continue with the endoform will use on both locations. 3 and also can recommend she should continue with padding between the toes am actually can recommend foam to be weaved between the toes to try to provide some additional padding. We will see patient back for reevaluation in 1 week here in the clinic. If anything worsens or changes patient will contact our office for additional recommendations. Electronic Signature(s) Signed: 07/19/2022 5:52:40 PM By: Allen Derry PA-C Entered By: Allen Derry on 07/19/2022 17:52:40 -------------------------------------------------------------------------------- SuperBill Details Patient Name: Date of Service: [name], Aloni L. 07/19/2022 Medical Record Number: [id number] Patient Account Number: [id number] Date of  Birth/Sex: Treating RN: 1934/04/23 (88 y.o. [name] Primary Care Provider: Marylynn Pearson Other Clinician: Betha Loa Referring Provider: Treating Provider/Extender: Lorna Dibble Weeks in Treatment: 21 Diagnosis Coding ICD-10 Codes Code Description [phone] Pressure ulcer of other site, stage 3 L97.512 Non-pressure chronic ulcer of other part of right foot with fat layer exposed I50.42 Chronic combined systolic (congestive) and diastolic (congestive) heart failure F01.511 Vascular dementia, unspecified severity, with agitation J44.9 Chronic obstructive pulmonary disease, unspecified Facility Procedures : CPT4 Code: [id number] Description: 99213 - WOUND CARE VISIT-LEV 3 EST PT Modifier: Quantity: 1 Physician Procedures : CPT4 Code Description Modifier [id number]  - WC PHYS LEVEL 3 - EST PT ICD-10 Diagnosis Description L89.893 Pressure ulcer of other site, stage 3 L97.512 Non-pressure chronic ulcer of other part of right foot with fat layer exposed I50.42 Chronic  combined systolic (congestive) and diastolic (congestive) heart failure F01.511 Vascular dementia, unspecified severity, with agitation Quantity: 1 Electronic Signature(s) Straw, Lissa L ([id number]) [phone].pdf Page 8 of 8 Signed: 07/19/2022 5:52:57 PM By: Allen Derry PA-C Previous Signature: 07/19/2022 5:21:06 PM Version By: Betha Loa Entered By: Allen Derry on 07/19/2022 17:52:57

## 2022-07-25 ENCOUNTER — Other Ambulatory Visit: Payer: Self-pay | Admitting: Internal Medicine

## 2022-07-25 ENCOUNTER — Encounter (HOSPITAL_COMMUNITY): Payer: Self-pay | Admitting: Internal Medicine

## 2022-07-25 ENCOUNTER — Encounter (HOSPITAL_COMMUNITY): Payer: Self-pay

## 2022-07-25 ENCOUNTER — Observation Stay (HOSPITAL_COMMUNITY): Payer: Medicare Other

## 2022-07-25 ENCOUNTER — Other Ambulatory Visit: Payer: Self-pay

## 2022-07-25 ENCOUNTER — Inpatient Hospital Stay (HOSPITAL_COMMUNITY)
Admission: RE | Admit: 2022-07-25 | Discharge: 2022-07-29 | DRG: 552 | Disposition: A | Discharge status: Skilled Nursing Facility | Payer: Medicare Other | Source: Skilled Nursing Facility | Attending: Internal Medicine | Admitting: Internal Medicine

## 2022-07-25 DIAGNOSIS — S52501A Unspecified fracture of the lower end of right radius, initial encounter for closed fracture: Secondary | ICD-10-CM

## 2022-07-25 DIAGNOSIS — Z79899 Other long term (current) drug therapy: Secondary | ICD-10-CM

## 2022-07-25 DIAGNOSIS — K219 Gastro-esophageal reflux disease without esophagitis: Secondary | ICD-10-CM | POA: Diagnosis present

## 2022-07-25 DIAGNOSIS — Y92129 Unspecified place in nursing home as the place of occurrence of the external cause: Secondary | ICD-10-CM

## 2022-07-25 DIAGNOSIS — S32019A Unspecified fracture of first lumbar vertebra, initial encounter for closed fracture: Secondary | ICD-10-CM | POA: Diagnosis present

## 2022-07-25 DIAGNOSIS — F419 Anxiety disorder, unspecified: Secondary | ICD-10-CM | POA: Diagnosis present

## 2022-07-25 DIAGNOSIS — Z66 Do not resuscitate: Secondary | ICD-10-CM | POA: Diagnosis present

## 2022-07-25 DIAGNOSIS — F32A Depression, unspecified: Secondary | ICD-10-CM | POA: Diagnosis not present

## 2022-07-25 DIAGNOSIS — S32010A Wedge compression fracture of first lumbar vertebra, initial encounter for closed fracture: Secondary | ICD-10-CM | POA: Diagnosis present

## 2022-07-25 DIAGNOSIS — Z96642 Presence of left artificial hip joint: Secondary | ICD-10-CM | POA: Diagnosis present

## 2022-07-25 DIAGNOSIS — Z87891 Personal history of nicotine dependence: Secondary | ICD-10-CM

## 2022-07-25 DIAGNOSIS — J8410 Pulmonary fibrosis, unspecified: Secondary | ICD-10-CM | POA: Diagnosis present

## 2022-07-25 DIAGNOSIS — Z833 Family history of diabetes mellitus: Secondary | ICD-10-CM

## 2022-07-25 DIAGNOSIS — S52591A Other fractures of lower end of right radius, initial encounter for closed fracture: Secondary | ICD-10-CM | POA: Diagnosis present

## 2022-07-25 DIAGNOSIS — Z823 Family history of stroke: Secondary | ICD-10-CM

## 2022-07-25 DIAGNOSIS — S32018A Other fracture of first lumbar vertebra, initial encounter for closed fracture: Principal | ICD-10-CM | POA: Diagnosis present

## 2022-07-25 DIAGNOSIS — Z8711 Personal history of peptic ulcer disease: Secondary | ICD-10-CM

## 2022-07-25 DIAGNOSIS — Z885 Allergy status to narcotic agent status: Secondary | ICD-10-CM

## 2022-07-25 DIAGNOSIS — N390 Urinary tract infection, site not specified: Secondary | ICD-10-CM | POA: Diagnosis present

## 2022-07-25 DIAGNOSIS — L89892 Pressure ulcer of other site, stage 2: Secondary | ICD-10-CM | POA: Diagnosis present

## 2022-07-25 DIAGNOSIS — F0393 Unspecified dementia, unspecified severity, with mood disturbance: Secondary | ICD-10-CM | POA: Diagnosis present

## 2022-07-25 DIAGNOSIS — M810 Age-related osteoporosis without current pathological fracture: Secondary | ICD-10-CM | POA: Diagnosis present

## 2022-07-25 DIAGNOSIS — R54 Age-related physical debility: Secondary | ICD-10-CM | POA: Diagnosis present

## 2022-07-25 DIAGNOSIS — W19XXXA Unspecified fall, initial encounter: Secondary | ICD-10-CM | POA: Diagnosis not present

## 2022-07-25 DIAGNOSIS — Z882 Allergy status to sulfonamides status: Secondary | ICD-10-CM

## 2022-07-25 DIAGNOSIS — E785 Hyperlipidemia, unspecified: Secondary | ICD-10-CM | POA: Diagnosis present

## 2022-07-25 DIAGNOSIS — L8990 Pressure ulcer of unspecified site, unspecified stage: Secondary | ICD-10-CM | POA: Insufficient documentation

## 2022-07-25 DIAGNOSIS — W1830XA Fall on same level, unspecified, initial encounter: Secondary | ICD-10-CM | POA: Diagnosis present

## 2022-07-25 DIAGNOSIS — Z888 Allergy status to other drugs, medicaments and biological substances status: Secondary | ICD-10-CM

## 2022-07-25 DIAGNOSIS — F0390 Unspecified dementia without behavioral disturbance: Secondary | ICD-10-CM | POA: Diagnosis present

## 2022-07-25 DIAGNOSIS — Z881 Allergy status to other antibiotic agents status: Secondary | ICD-10-CM

## 2022-07-25 DIAGNOSIS — J449 Chronic obstructive pulmonary disease, unspecified: Secondary | ICD-10-CM | POA: Diagnosis present

## 2022-07-25 DIAGNOSIS — Z8249 Family history of ischemic heart disease and other diseases of the circulatory system: Secondary | ICD-10-CM

## 2022-07-25 DIAGNOSIS — I5032 Chronic diastolic (congestive) heart failure: Secondary | ICD-10-CM | POA: Diagnosis present

## 2022-07-25 DIAGNOSIS — F0394 Unspecified dementia, unspecified severity, with anxiety: Secondary | ICD-10-CM | POA: Diagnosis present

## 2022-07-25 DIAGNOSIS — S32028A Other fracture of second lumbar vertebra, initial encounter for closed fracture: Secondary | ICD-10-CM | POA: Diagnosis present

## 2022-07-25 DIAGNOSIS — N3281 Overactive bladder: Secondary | ICD-10-CM | POA: Diagnosis present

## 2022-07-25 LAB — CBC
HCT: 30.6 % — ABNORMAL LOW (ref 36.0–46.0)
Hemoglobin: 9.7 g/dL — ABNORMAL LOW (ref 12.0–15.0)
MCH: 29.2 pg (ref 26.0–34.0)
MCHC: 31.7 g/dL (ref 30.0–36.0)
MCV: 92.2 fL (ref 80.0–100.0)
Platelets: 228 10*3/uL (ref 150–400)
RBC: 3.32 MIL/uL — ABNORMAL LOW (ref 3.87–5.11)
RDW: 13.1 % (ref 11.5–15.5)
WBC: 8.1 10*3/uL (ref 4.0–10.5)
nRBC: 0 % (ref 0.0–0.2)

## 2022-07-25 LAB — BASIC METABOLIC PANEL
Anion gap: 7 (ref 5–15)
BUN: 20 mg/dL (ref 8–23)
CO2: 25 mmol/L (ref 22–32)
Calcium: 8.2 mg/dL — ABNORMAL LOW (ref 8.9–10.3)
Chloride: 103 mmol/L (ref 98–111)
Creatinine, Ser: 1 mg/dL (ref 0.44–1.00)
GFR, Estimated: 55 mL/min — ABNORMAL LOW (ref >60)
Glucose, Bld: 162 mg/dL — ABNORMAL HIGH (ref 70–99)
Potassium: 4.4 mmol/L (ref 3.5–5.1)
Sodium: 135 mmol/L (ref 135–145)

## 2022-07-25 MED ORDER — DONEPEZIL HCL 5 MG PO TABS
10.0000 mg | ORAL_TABLET | Freq: Every day | ORAL | Status: DC
  Administered 2022-07-25 – 2022-07-29 (×5): 10 mg via ORAL
  Filled 2022-07-25 (×5): qty 2

## 2022-07-25 MED ORDER — MEMANTINE HCL 10 MG PO TABS
10.0000 mg | ORAL_TABLET | Freq: Two times a day (BID) | ORAL | Status: DC
  Administered 2022-07-25 – 2022-07-29 (×8): 10 mg via ORAL
  Filled 2022-07-25 (×8): qty 1

## 2022-07-25 MED ORDER — FUROSEMIDE 40 MG PO TABS
40.0000 mg | ORAL_TABLET | Freq: Every day | ORAL | Status: DC
  Administered 2022-07-26 – 2022-07-29 (×4): 40 mg via ORAL
  Filled 2022-07-25 (×4): qty 1

## 2022-07-25 MED ORDER — ONDANSETRON HCL 4 MG PO TABS: 4.0000 mg | ORAL_TABLET | Freq: Four times a day (QID) | ORAL | Status: DC | PRN

## 2022-07-25 MED ORDER — ACETAMINOPHEN 325 MG PO TABS
650.0000 mg | ORAL_TABLET | Freq: Four times a day (QID) | ORAL | Status: DC | PRN
  Administered 2022-07-25 – 2022-07-28 (×3): 650 mg via ORAL
  Filled 2022-07-25 (×3): qty 2

## 2022-07-25 MED ORDER — POLYETHYLENE GLYCOL 3350 17 G PO PACK: 17.0000 g | PACK | Freq: Every day | ORAL | Status: DC | PRN

## 2022-07-25 MED ORDER — PANTOPRAZOLE SODIUM 40 MG PO TBEC
40.0000 mg | DELAYED_RELEASE_TABLET | Freq: Two times a day (BID) | ORAL | Status: DC
  Administered 2022-07-25 – 2022-07-29 (×8): 40 mg via ORAL
  Filled 2022-07-25 (×8): qty 1

## 2022-07-25 MED ORDER — ENOXAPARIN SODIUM 30 MG/0.3ML IJ SOSY
30.0000 mg | PREFILLED_SYRINGE | INTRAMUSCULAR | Status: DC
  Administered 2022-07-25 – 2022-07-28 (×4): 30 mg via SUBCUTANEOUS
  Filled 2022-07-25 (×4): qty 0.3

## 2022-07-25 MED ORDER — LIDOCAINE 5 % EX PTCH
1.0000 | MEDICATED_PATCH | Freq: Every day | CUTANEOUS | Status: DC | PRN
  Administered 2022-07-27: 1 via TRANSDERMAL
  Filled 2022-07-25: qty 1

## 2022-07-25 MED ORDER — ACETAMINOPHEN 650 MG RE SUPP: 650.0000 mg | Freq: Four times a day (QID) | RECTAL | Status: DC | PRN

## 2022-07-25 MED ORDER — IPRATROPIUM-ALBUTEROL 0.5-2.5 (3) MG/3ML IN SOLN
3.0000 mL | RESPIRATORY_TRACT | Status: DC | PRN
  Administered 2022-07-28: 3 mL via RESPIRATORY_TRACT

## 2022-07-25 MED ORDER — TRAMADOL HCL 50 MG PO TABS
50.0000 mg | ORAL_TABLET | Freq: Four times a day (QID) | ORAL | Status: DC | PRN
  Administered 2022-07-25 – 2022-07-29 (×6): 50 mg via ORAL
  Filled 2022-07-25 (×6): qty 1

## 2022-07-25 MED ORDER — SODIUM CHLORIDE 0.9 % IV SOLN
1.0000 g | INTRAVENOUS | Status: DC
Start: 2022-07-25 — End: 2022-07-25

## 2022-07-25 MED ORDER — ZOLPIDEM TARTRATE 5 MG PO TABS
5.0000 mg | ORAL_TABLET | Freq: Every evening | ORAL | Status: DC | PRN
  Administered 2022-07-25 – 2022-07-28 (×3): 5 mg via ORAL
  Filled 2022-07-25 (×3): qty 1

## 2022-07-25 MED ORDER — SODIUM CHLORIDE 0.9 % IV SOLN
1.0000 g | INTRAVENOUS | Status: DC
  Administered 2022-07-26 – 2022-07-28 (×3): 1 g via INTRAVENOUS
  Filled 2022-07-25 (×3): qty 10

## 2022-07-25 MED ORDER — FLUTICASONE FUROATE-VILANTEROL 100-25 MCG/ACT IN AEPB
1.0000 | INHALATION_SPRAY | Freq: Every morning | RESPIRATORY_TRACT | Status: DC
  Administered 2022-07-26 – 2022-07-29 (×4): 1 via RESPIRATORY_TRACT
  Filled 2022-07-25: qty 28

## 2022-07-25 MED ORDER — ALPRAZOLAM 0.5 MG PO TABS
0.5000 mg | ORAL_TABLET | Freq: Two times a day (BID) | ORAL | Status: DC
  Administered 2022-07-25 – 2022-07-29 (×8): 0.5 mg via ORAL
  Filled 2022-07-25 (×8): qty 1

## 2022-07-25 MED ORDER — ONDANSETRON HCL 4 MG/2ML IJ SOLN: 4.0000 mg | Freq: Four times a day (QID) | INTRAMUSCULAR | Status: DC | PRN

## 2022-07-25 NOTE — Assessment & Plan Note (Addendum)
L1/L2 vertebral compression fracture -not amendable to surgery -Will continue supportive therapy -Sensitive to strong products, -Continue with pain management anti-inflammatories -PT/OT -If her condition worsens we will repeat imaging -Follow-up as outpatient

## 2022-07-25 NOTE — H&P (Signed)
History and Physical    [name] UXL:[id number] DOB: 15-Dec-1934 DOA: 07/25/2022  PCP: Marylynn Pearson, FNP   Patient coming from: [facility]  I have personally briefly reviewed patient's old medical records in [facility] Health Link  Chief Complaint: Fall, back Pain  HPI: [name] is a 88 y.o. female with medical history significant for  CHF, COPD, pulmonary fibrosis, dementia.    Patient's was transferred from [facility] with reports of a fall, with subsequent CT lumbar spine with findings of severe compression fracture at L1 and L2.  She was also diagnosed with UTI, started on Rocephin.  Patient was admitted to hospitalist service this morning at about 7 AM, with the above diagnosis.  Neurosurgeon Dr. Christell Constant was consulted, and said patient's fractures were nonsurgical.  Patient was admitted for pain control and UTI.  She was also given IV morphine with drop in O2 sats to 90%, she was placed on 2 L. Vitals were stable, with temperature of 97.3, blood pressure 132/68, heart rate of 85, respirate rate of 18, O2 sats of 98% on 2 L. Hospitalist reported that daughter did not want patient admitted at [facility] and requested patient be transferred to [facility].  Patient lives at home with multiple family members, ambulates with a walker at baseline.  Daughter- [name] at bedside.  She reports that patient has been having urinary frequency without dysuria over the past several days.  She has also been complaining of pain to her right wrist since fall.- 7/19.  Fall was unwitnessed, patient did not hit her head.  She was unable to get up after the fall.  At the time of my evaluation, patient is awake alert oriented to person, and able to answer simple questions.  She does not know why she fell.  She denies dizziness.  No chest pain no difficulty breathing.  She denies pain with urination.    Review of Systems: As per HPI all other systems reviewed and negative.  Past  Medical History:  Diagnosis Date   Anxiety    Chronic diastolic (congestive) heart failure (HCC)    Chronic obstructive pulmonary disease, unspecified (HCC)    COPD (chronic obstructive pulmonary disease) (HCC)    GERD (gastroesophageal reflux disease)    Hyperlipidemia    IBS (irritable bowel syndrome)    Lumbago with sciatica    Osteoporosis    Overactive bladder    Pneumonia    Pulmonary fibrosis (HCC)     Past Surgical History:  Procedure Laterality Date   COLONOSCOPY  10/04/2009   sigmoid diverticula, multiple tubulovillous adneomas, needs surveillance Oct 2013   ESOPHAGOGASTRODUODENOSCOPY  04/16/2011   Dr. Jena Gauss: Noncritical Schatzkis ring( not manipulated because no dysphagia). Normal esophagus otherwise  large hiatal hernia. Gastric polyp-status post biopsy. Gastric erosions-staus post biopsy. Abnormal bulb-status post biopsy. benign small bowel, stomach biopsy with ulcerated gastric antral mucosa with foveolar hyperplasia and surface erosion, polyp with inflamed gastric antral mucosa    ESOPHAGOGASTRODUODENOSCOPY N/A 04/29/2014   Dr. Jena Gauss: Noncritical Schatzki's ring large hiatal hernia. Retained gastric contents.GES with slight delay   femur fracture Left 2023   HIP ARTHROPLASTY Left 09/24/2021   Procedure: ARTHROPLASTY BIPOLAR HIP (HEMIARTHROPLASTY);  Surgeon: Oliver Barre, MD;  Location: [facility];  Service: Orthopedics;  Laterality: Left;   HIP PINNING,CANNULATED Right 04/06/2018   Procedure: CANNULATED HIP PINNING;  Surgeon: Vickki Hearing, MD;  Location: [facility];  Service: Orthopedics;  Laterality: Right;     reports  that she has quit smoking. Her smoking use included cigarettes. She has been exposed to tobacco smoke. Her smokeless tobacco use includes snuff. She reports that she does not drink alcohol and does not use drugs.  Allergies  Allergen Reactions   Codeine Shortness Of Breath and Rash   Codeine Anaphylaxis   Gabapentin Anaphylaxis   Levofloxacin  Anaphylaxis    Patient was admitted to hospital with lung problems due to this medication   Sulfonamide Derivatives Hives and Shortness Of Breath   Aloe Other (See Comments)   Doxycycline Hives   Septra [Sulfamethoxazole-Trimethoprim]    Sulfa Antibiotics Hives   Trimethoprim     Family History  Problem Relation Age of Onset   Stroke Mother    Diabetes Mother    Heart disease Father    Colon cancer Neg Hx     Prior to Admission medications   Medication Sig Start Date End Date Taking? Authorizing Provider  albuterol (PROAIR HFA) 108 (90 Base) MCG/ACT inhaler Inhale 2 puffs into the lungs every 4 (four) hours as needed for wheezing or shortness of breath. Patient taking differently: Inhale 2 puffs into the lungs every 6 (six) hours as needed for wheezing or shortness of breath. 06/12/21   Johnson, Clanford L, MD  ALPRAZolam Prudy Feeler) 0.5 MG tablet Take 1 tablet (0.5 mg total) by mouth 2 (two) times daily. 09/30/21   Burnadette Pop, MD  diclofenac Sodium (VOLTAREN) 1 % GEL APPLY AS DIRECTED TO THE AFFECTED KNEE FOUR TIMES DAILY. Patient taking differently: Apply 4 g topically 4 (four) times daily. 08/04/20   Vickki Hearing, MD  donepezil (ARICEPT) 10 MG tablet Take 10 mg by mouth every morning. 07/25/19   [provider]  fluticasone furoate-vilanterol (BREO ELLIPTA) 100-25 MCG/ACT AEPB Inhale 1 puff into the lungs every morning.    [provider]  furosemide (LASIX) 40 MG tablet Take 40 mg by mouth daily.    [provider]  ipratropium-albuterol (DUONEB) 0.5-2.5 (3) MG/3ML SOLN Take 3 mLs by nebulization in the morning, at noon, in the evening, and at bedtime.    [provider]  LINZESS 145 MCG CAPS capsule Take 145 mcg by mouth daily as needed (bowel movement). 05/22/21   [provider]  loratadine (CLARITIN) 10 MG tablet Take 10 mg by mouth every morning. 08/29/21   [provider]  memantine (NAMENDA) 10 MG tablet Take 10 mg by  mouth 2 (two) times daily.    [provider]  Multiple Vitamin (MULTIVITAMIN WITH MINERALS) TABS tablet Take 1 tablet by mouth daily.    [provider]  mupirocin ointment (BACTROBAN) 2 % Apply 1 Application topically daily. Patient not taking: Reported on 04/23/2022 01/12/22   Edwin Cap, DPM  pantoprazole (PROTONIX) 40 MG tablet TAKE (1) TABLET BY MOUTH TWICE A DAY WITH MEALS (BREAKFAST AND SUPPER) Patient taking differently: Take 40 mg by mouth 2 (two) times daily. 05/24/19   Anice Paganini, NP  polyethylene glycol (MIRALAX / GLYCOLAX) 17 g packet Take 17 g by mouth daily. Patient not taking: Reported on 04/23/2022 09/30/21   Burnadette Pop, MD  potassium chloride (K-DUR) 10 MEQ tablet Take 1 tablet (10 mEq total) by mouth daily. 06/23/18   Sharee Holster, NP  simvastatin (ZOCOR) 10 MG tablet Take 5 mg by mouth daily. 02/27/21   [provider]  traMADol (ULTRAM) 50 MG tablet Take 1 tablet (50 mg total) by mouth every 8 (eight) hours as needed for moderate pain.  Patient taking differently: Take 100 mg by mouth every 6 (six) hours. 09/30/21   Burnadette Pop, MD  zolpidem (AMBIEN) 5 MG tablet Take 5 mg by mouth at bedtime.    [provider]    Physical Exam: Vitals:   07/25/22 1819 07/25/22 1843  BP: (!) 143/85   Pulse: 81   Resp: 18   Temp: 98 F (36.7 C)   TempSrc: Oral   SpO2: 96%   Weight:  49.8 kg    Constitutional: NAD, calm, comfortable Eyes: PERRL, lids and conjunctivae normal ENMT: Mucous membranes are moist.  Neck: normal, supple, no masses, no thyromegaly Respiratory: clear to auscultation bilaterally, no wheezing, no crackles. Normal respiratory effort. No accessory muscle use.  Cardiovascular: Regular rate and rhythm, no murmurs / rubs / gallops. No extremity edema.  Abdomen: no tenderness, no masses palpated. No hepatosplenomegaly. Bowel sounds positive.  Musculoskeletal:  Osteoarthritic deformities to fingers of bilateral hands,  bony deformity to wrist of both hands, limited range of motion of right wrist due to pain. Skin: Mild superficial blisters to medial aspects metatarsophalangeal joints bilaterally, no rashes,. No induration Neurologic: No apparent cranial nerve abnormality, moving extremities spontaneously.  Psychiatric: Normal judgment and insight. Alert and oriented x person. Normal mood.   Labs on Admission: I have personally reviewed following labs and imaging studies  EKG: None   Assessment/Plan Principal Problem:   Fall Active Problems:   Compression fracture of L1,L2 vertebra (HCC)   Depression   Dementia (HCC)   Chronic obstructive pulmonary disease, unspecified (HCC)   Pulmonary fibrosis, unspecified (HCC)   Anxiety disorder, unspecified   Chronic diastolic (congestive) heart failure (HCC)  Assessment and Plan: * Fall Fall with subsequent compression fracture-L1-L2,.  Signout from [facility] hospitalist Dr. Vena Austria.  Unfortunately discharge summary from [facility] health sent over with patient did not include details of ED visit or hospitalization, no imaging findings blood work H&P or hospitalist discharge summary.   -At this time patient appears comfortable, family reports intolerance(hypoxia) to morphine, Dilaudid or similar opiates -Tylenol, lidocaine patch, as needed tramadol -Request records from [facility] - PT eval -Also diagnosed with UTI, obtain UA, [facility], continue IV Rocephin started at [facility].    Compression fracture of L1,L2 vertebra (HCC) - Pain control, PT eval. - Obtain records including imaging records from [facility] health -Follow-up as outpatient   Chronic diastolic (congestive) heart failure (HCC) Stable and compensated.  Last echo 04/2022 EF 55 to 60%, grade 1 DD. -Resume home Lasix  Chronic obstructive pulmonary disease, unspecified (HCC) Stable. -Resume home regimen  Dementia (HCC) Stable.  Lives at home with several family  members. -Resume donepezil, memantine   DVT prophylaxis: Lovenox Code Status: DNR-confirmed with daughter at bedside.  New DNR form signed.  [facility] documents reviewed. Family Communication: Daughter Florence Canner at bedside.  Patient's granddaughter also present Disposition Plan: ~ 1 -2 days Consults called: None Admission status:  Obs Tele   Author: Onnie Boer, MD 07/25/2022 8:51 PM  For on call review [email or web address].

## 2022-07-25 NOTE — Assessment & Plan Note (Signed)
Stable. Satting 100% on 2 L of oxygen -Resume home regimen

## 2022-07-25 NOTE — Assessment & Plan Note (Signed)
Stable and compensated.   Last echo 04/2022 EF 55 to 60%, grade 1 DD. -Resume home Lasix -Monitoring daily weight Filed Weights   07/25/22 1843  Weight: 49.8 kg

## 2022-07-25 NOTE — Assessment & Plan Note (Signed)
Stable.  Lives at home with several family members. -Resume donepezil, memantine

## 2022-07-25 NOTE — Assessment & Plan Note (Addendum)
Fall with subsequent compression fracture-L1-L2,.  Signout from [facility] hospitalist Dr. Vena Austria.  Unfortunately discharge summary from [facility] health sent over with patient did not include details of ED visit or hospitalization, no imaging findings blood work H&P or hospitalist discharge summary.   -At this time patient appears comfortable, family reports intolerance(hypoxia) to morphine, Dilaudid or similar opiates -Tylenol, lidocaine patch, as needed tramadol -Request records from [facility] - PT eval -Also diagnosed with UTI, obtain UA, [facility], continue IV Rocephin started at [facility].

## 2022-07-26 DIAGNOSIS — L8990 Pressure ulcer of unspecified site, unspecified stage: Secondary | ICD-10-CM | POA: Insufficient documentation

## 2022-07-26 DIAGNOSIS — S32010K Wedge compression fracture of first lumbar vertebra, subsequent encounter for fracture with nonunion: Secondary | ICD-10-CM | POA: Diagnosis not present

## 2022-07-26 DIAGNOSIS — S32010G Wedge compression fracture of first lumbar vertebra, subsequent encounter for fracture with delayed healing: Secondary | ICD-10-CM

## 2022-07-26 DIAGNOSIS — N3281 Overactive bladder: Secondary | ICD-10-CM | POA: Diagnosis present

## 2022-07-26 DIAGNOSIS — Z881 Allergy status to other antibiotic agents status: Secondary | ICD-10-CM | POA: Diagnosis not present

## 2022-07-26 DIAGNOSIS — K219 Gastro-esophageal reflux disease without esophagitis: Secondary | ICD-10-CM | POA: Diagnosis present

## 2022-07-26 DIAGNOSIS — N390 Urinary tract infection, site not specified: Secondary | ICD-10-CM | POA: Diagnosis present

## 2022-07-26 DIAGNOSIS — F0394 Unspecified dementia, unspecified severity, with anxiety: Secondary | ICD-10-CM | POA: Diagnosis present

## 2022-07-26 DIAGNOSIS — F0393 Unspecified dementia, unspecified severity, with mood disturbance: Secondary | ICD-10-CM | POA: Diagnosis present

## 2022-07-26 DIAGNOSIS — S32010A Wedge compression fracture of first lumbar vertebra, initial encounter for closed fracture: Secondary | ICD-10-CM | POA: Diagnosis not present

## 2022-07-26 DIAGNOSIS — Z8711 Personal history of peptic ulcer disease: Secondary | ICD-10-CM | POA: Diagnosis not present

## 2022-07-26 DIAGNOSIS — S32028A Other fracture of second lumbar vertebra, initial encounter for closed fracture: Secondary | ICD-10-CM | POA: Diagnosis present

## 2022-07-26 DIAGNOSIS — M549 Dorsalgia, unspecified: Secondary | ICD-10-CM | POA: Diagnosis present

## 2022-07-26 DIAGNOSIS — Z515 Encounter for palliative care: Secondary | ICD-10-CM | POA: Diagnosis not present

## 2022-07-26 DIAGNOSIS — S52591A Other fractures of lower end of right radius, initial encounter for closed fracture: Secondary | ICD-10-CM | POA: Diagnosis not present

## 2022-07-26 DIAGNOSIS — Z66 Do not resuscitate: Secondary | ICD-10-CM | POA: Diagnosis present

## 2022-07-26 DIAGNOSIS — Z8249 Family history of ischemic heart disease and other diseases of the circulatory system: Secondary | ICD-10-CM | POA: Diagnosis not present

## 2022-07-26 DIAGNOSIS — M810 Age-related osteoporosis without current pathological fracture: Secondary | ICD-10-CM | POA: Diagnosis present

## 2022-07-26 DIAGNOSIS — Z96642 Presence of left artificial hip joint: Secondary | ICD-10-CM | POA: Diagnosis present

## 2022-07-26 DIAGNOSIS — S32029A Unspecified fracture of second lumbar vertebra, initial encounter for closed fracture: Secondary | ICD-10-CM | POA: Diagnosis not present

## 2022-07-26 DIAGNOSIS — S32019A Unspecified fracture of first lumbar vertebra, initial encounter for closed fracture: Secondary | ICD-10-CM | POA: Diagnosis not present

## 2022-07-26 DIAGNOSIS — J449 Chronic obstructive pulmonary disease, unspecified: Secondary | ICD-10-CM | POA: Diagnosis present

## 2022-07-26 DIAGNOSIS — F32A Depression, unspecified: Secondary | ICD-10-CM | POA: Diagnosis present

## 2022-07-26 DIAGNOSIS — W19XXXD Unspecified fall, subsequent encounter: Secondary | ICD-10-CM | POA: Diagnosis not present

## 2022-07-26 DIAGNOSIS — S32010S Wedge compression fracture of first lumbar vertebra, sequela: Secondary | ICD-10-CM | POA: Diagnosis not present

## 2022-07-26 DIAGNOSIS — L89892 Pressure ulcer of other site, stage 2: Secondary | ICD-10-CM | POA: Diagnosis present

## 2022-07-26 DIAGNOSIS — Z833 Family history of diabetes mellitus: Secondary | ICD-10-CM | POA: Diagnosis not present

## 2022-07-26 DIAGNOSIS — J8410 Pulmonary fibrosis, unspecified: Secondary | ICD-10-CM | POA: Diagnosis present

## 2022-07-26 DIAGNOSIS — E785 Hyperlipidemia, unspecified: Secondary | ICD-10-CM | POA: Diagnosis present

## 2022-07-26 DIAGNOSIS — S32018A Other fracture of first lumbar vertebra, initial encounter for closed fracture: Secondary | ICD-10-CM | POA: Diagnosis present

## 2022-07-26 DIAGNOSIS — Y92129 Unspecified place in nursing home as the place of occurrence of the external cause: Secondary | ICD-10-CM | POA: Diagnosis not present

## 2022-07-26 DIAGNOSIS — Z7189 Other specified counseling: Secondary | ICD-10-CM | POA: Diagnosis not present

## 2022-07-26 DIAGNOSIS — Z87891 Personal history of nicotine dependence: Secondary | ICD-10-CM | POA: Diagnosis not present

## 2022-07-26 DIAGNOSIS — I5032 Chronic diastolic (congestive) heart failure: Secondary | ICD-10-CM | POA: Diagnosis present

## 2022-07-26 DIAGNOSIS — R54 Age-related physical debility: Secondary | ICD-10-CM | POA: Diagnosis present

## 2022-07-26 DIAGNOSIS — N3000 Acute cystitis without hematuria: Secondary | ICD-10-CM | POA: Diagnosis not present

## 2022-07-26 DIAGNOSIS — Z823 Family history of stroke: Secondary | ICD-10-CM | POA: Diagnosis not present

## 2022-07-26 DIAGNOSIS — W1830XA Fall on same level, unspecified, initial encounter: Secondary | ICD-10-CM | POA: Diagnosis present

## 2022-07-26 LAB — URINALYSIS, W/ REFLEX TO CULTURE (INFECTION SUSPECTED)
Bilirubin Urine: NEGATIVE
Glucose, UA: NEGATIVE mg/dL
Hgb urine dipstick: NEGATIVE
Ketones, ur: 5 mg/dL — AB
Nitrite: NEGATIVE
Protein, ur: NEGATIVE mg/dL
Specific Gravity, Urine: 1.016 (ref 1.005–1.030)
WBC, UA: >50 WBC/hpf (ref 0–5)
pH: 5 (ref 5.0–8.0)

## 2022-07-26 MED ORDER — IBUPROFEN 600 MG PO TABS
600.0000 mg | ORAL_TABLET | Freq: Four times a day (QID) | ORAL | Status: DC | PRN
  Administered 2022-07-26 – 2022-07-27 (×3): 600 mg via ORAL
  Filled 2022-07-26 (×4): qty 1

## 2022-07-26 NOTE — TOC Progression Note (Signed)
Transition of Care [facility]) - Progression Note    Patient Details  Name: MAKHYA ARAVE MRN: [id number] Date of Birth: 1934-03-22  [facility] CM/SW Contact  Leitha Bleak, RN Phone Number: 07/26/2022, 3:55 PM  Clinical Narrative:   Daughter is concerned with her mother being home alone at night. She has aides during the day, the family is sleeping or working. She feels she needs SNF for 2 weeks to get stronger.  Patient has medicare, is not inpatient, CM explained to daughter we can send out for bed offers. She requested [place], or [place].    Expected Discharge Plan: Skilled Nursing Facility Barriers to Discharge: Continued Medical Work up  Expected Discharge Plan and Services         [facility] Agency: [facility] Date [facility] Agency Contacted: 07/26/22 Time HH Agency Contacted: [phone] Representative spoke with at [facility] Agency: Morrie Sheldon   Social Determinants of Health (SDOH) Interventions SDOH Screenings   Food Insecurity: No Food Insecurity (07/25/2022)  Housing: Low Risk  (07/25/2022)  Transportation Needs: No Transportation Needs (07/25/2022)  Utilities: Not At Risk (07/25/2022)  Tobacco Use: High Risk (07/25/2022)    Readmission Risk Interventions    07/26/2022   12:28 PM 09/30/2021    9:22 AM 06/12/2021   12:11 PM  Readmission Risk Prevention Plan  Transportation Screening Complete Complete Complete  PCP or Specialist Appt within 5-7 Days Not Complete    PCP or Specialist Appt within 3-5 Days   Complete  Home Care Screening Complete    Medication Review (RN CM) Complete    HRI or Home Care Consult   Complete  Social Work Consult for Recovery Care Planning/Counseling   Complete  Palliative Care Screening   Not Applicable  Medication Review [employer])  Complete Complete  HRI or Home Care Consult  Complete   SW Recovery Care/Counseling Consult  Complete   Palliative Care Screening  Not Applicable   Skilled Nursing Facility  Complete

## 2022-07-26 NOTE — Care Management Obs Status (Signed)
MEDICARE OBSERVATION STATUS NOTIFICATION   Patient Details  Name: MATTESON BLUE MRN: [id number] Date of Birth: 1934/09/06   Medicare Observation Status Notification Given:  Yes    Leitha Bleak, RN 07/26/2022, 1:33 PM

## 2022-07-26 NOTE — Hospital Course (Signed)
[name] is a 88 y.o. female with medical history significant for  CHF, COPD, pulmonary fibrosis, dementia.     Patient's was transferred from [facility] with reports of a fall, with subsequent CT lumbar spine with findings of severe compression fracture at L1 and L2.  She was also diagnosed with UTI, started on Rocephin.  Patient was admitted to hospitalist service this morning at about 7 AM, with the above diagnosis.  Neurosurgeon Dr. Christell Constant was consulted, and said patient's fractures were nonsurgical.  Patient was admitted for pain control and UTI.  She was also given IV morphine with drop in O2 sats to 90%, she was placed on 2 L. Vitals were stable, with temperature of 97.3, blood pressure 132/68, heart rate of 85, respirate rate of 18, O2 sats of 98% on 2 L. Hospitalist reported that daughter did not want patient admitted at [facility] and requested patient be transferred to [facility].   Patient lives at home with multiple family members, ambulates with a walker at baseline.  Daughter- [name] at bedside.  She reports that patient has been having urinary frequency without dysuria over the past several days.  She has also been complaining of pain to her right wrist since fall.- 7/19.  Fall was unwitnessed, patient did not hit her head.  She was unable to get up after the fall.  At the time of my evaluation, patient is awake alert oriented to person, and able to answer simple questions.  She does not know why she fell.  She denies dizziness.  No chest pain no difficulty breathing.  She denies pain with urination.

## 2022-07-26 NOTE — TOC Initial Note (Addendum)
Transition of Care [facility]) - Initial/Assessment Note    Patient Details  Name: [name] MRN: [id number] Date of Birth: [birth year]  Transition of Care [facility]) CM/SW Contact:    Leitha Bleak, RN Phone Number: 07/26/2022, 12:30 PM  Clinical Narrative:        Patient admitted after a fall. PT is recommending HHPT.  CM Confirmed with Morrie Sheldon at [employer] patient is active. She uses a walker. Patient is in OBS status now, TOC following for [facility] orders if needed.       CM spoke with her daughter. Patient lives with her and her family. She has 3N1, hospital bed, walker. She has a CAP aide 45 hours per week.  They have no other needs at this time.        Expected Discharge Plan: Home w Home Health Services Barriers to Discharge: Continued Medical Work up  Patient Goals and CMS Choice Patient states their goals for this hospitalization and ongoing recovery are:: to go home . CMS Medicare.gov Compare Post Acute Care list provided to:: Patient     Expected Discharge Plan and Services    [facility] Agency: [facility] Date [facility] Agency Contacted: 07/26/22 Time HH Agency Contacted: 1229 Representative spoke with at [facility] Agency: Morrie Sheldon  Prior Living Arrangements/Services   Lives with:: Relatives       Activities of Daily Living Home Assistive Devices/Equipment: Environmental consultant (specify type) ADL Screening (condition at time of admission) Patient's cognitive ability adequate to safely complete daily activities?: No Is the patient deaf or have difficulty hearing?: Yes Does the patient have difficulty seeing, even when wearing glasses/contacts?: No Does the patient have difficulty concentrating, remembering, or making decisions?: Yes Patient able to express need for assistance with ADLs?: Yes Does the patient have difficulty dressing or bathing?: Yes Independently performs ADLs?: No Does the patient have difficulty walking or climbing stairs?: Yes Weakness of Legs: Both Weakness of  Arms/Hands: Both  Permission Sought/Granted          Emotional Assessment       Orientation: : Oriented to Self, Oriented to Place, Oriented to  Time, Oriented to Situation Alcohol / Substance Use: Not Applicable Psych Involvement: No (comment)  Admission diagnosis:  Fall [W19.XXXA] Patient Active Problem List   Diagnosis Date Noted   Pressure injury of skin 07/26/2022   Fall 07/25/2022   Compression fracture of L1,L2 vertebra (HCC) 07/25/2022   Influenza A 04/28/2022   Hypoxia 04/23/2022   Anemia, unspecified 11/23/2021   Long term (current) use of inhaled steroids 11/23/2021   Unspecified dementia, unspecified severity, with mood disturbance (HCC) 11/23/2021   Fracture of femoral neck, left, closed (HCC) 09/24/2021   Acute respiratory failure with hypoxia (HCC) 06/10/2021   Tobacco use disorder 06/10/2021   Generalized weakness 06/10/2021   Bilateral conjunctivitis 06/10/2021   Chronic diastolic CHF (congestive heart failure) (HCC) 05/14/2021   Depression 05/14/2021   Hyperlipidemia    COPD exacerbation (HCC) 05/13/2021   Long term (current) use of opiate analgesic 01/04/2021   Chronic pain 04/23/2019   General unsteadiness 04/23/2019   Polyarthropathy 04/23/2019   Pseudobulbar affect 04/23/2019   Tremor 04/23/2019   Chronic obstructive pulmonary disease, unspecified (HCC)    Gastro-esophageal reflux disease without esophagitis    Hyperlipidemia, unspecified    Pulmonary fibrosis, unspecified (HCC)    Acute on chronic diastolic (congestive) heart failure (HCC)    Age-related osteoporosis without current pathological fracture    Anxiety disorder, unspecified    Chest pain, unspecified  Chronic diastolic (congestive) heart failure (HCC)    Collapsed vertebra, not elsewhere classified, site unspecified, subsequent encounter for fracture with routine healing    Edema, unspecified    Lumbago with sciatica    Overactive bladder    Pain in unspecified hand     Pressure ulcer of unspecified site, unspecified stage    Unspecified protein-calorie malnutrition (HCC)    Acute diastolic heart failure (HCC) 09/30/2018   Dyspnea 09/29/2018   Pneumonia 06/30/2018   Acute CHF (congestive heart failure) (HCC) 06/29/2018   Vascular dementia without behavioral disturbance (HCC) 06/26/2018   CAP (community acquired pneumonia) 06/23/2018   Bilateral pleural effusion 06/23/2018   Leukocytosis 06/20/2018   Chronic anemia 06/20/2018   Dementia (HCC) 06/19/2018   Bilateral lower extremity edema 06/18/2018   Protein-calorie malnutrition, severe (HCC) 06/18/2018   S/P internal fixation right hip fracture cannulated screw placement 04/06/18 04/27/2018   UTI (urinary tract infection) 04/11/2018   Altered mental status 04/10/2018   Anxiety 04/10/2018   Hyponatremia 04/10/2018   UTI (urinary tract infection) due to urinary indwelling catheter (HCC) 04/10/2018   Chronic constipation 04/09/2018   Urinary retention 04/09/2018   Closed right hip fracture (HCC) 04/05/2018   GERD (gastroesophageal reflux disease) 04/05/2018   COPD with acute exacerbation (HCC) 04/05/2018   Hiatal hernia    Dyspepsia 04/04/2014   Hematochezia 04/30/2011   RUQ pain 03/25/2011   Adenomatous polyps 03/25/2011   ABDOMINAL PAIN, LEFT LOWER QUADRANT 10/16/2009   HYPERLIPIDEMIA 10/09/2009   BRONCHITIS 10/09/2009   Osteoporosis 10/09/2009   Personal history of pneumonia (recurrent) 01/05/1999   Mixed hyperlipidemia 01/05/1999   PCP:  Marylynn Pearson, FNP Pharmacy:   [facility] - [place], [place] - [address] [place] [address] Phone: [phone] Fax: [phone]  [facility] - [address] [place], [place] - [address] [place] [address] Phone: [phone] Fax: [phone]  [id number] - [place], [place] - [address] AT [facility] [address] [place]  [address] Phone: [phone] Fax: [phone]     Social Determinants of Health (SDOH) Social History: SDOH Screenings   Food Insecurity: No Food Insecurity (07/25/2022)  Housing: Low Risk  (07/25/2022)  Transportation Needs: No Transportation Needs (07/25/2022)  Utilities: Not At Risk (07/25/2022)  Tobacco Use: High Risk (07/25/2022)   SDOH Interventions:     Readmission Risk Interventions    07/26/2022   12:28 PM 09/30/2021    9:22 AM 06/12/2021   12:11 PM  Readmission Risk Prevention Plan  Transportation Screening Complete Complete Complete  PCP or Specialist Appt within 5-7 Days Not Complete    PCP or Specialist Appt within 3-5 Days   Complete  Home Care Screening Complete    Medication Review (RN CM) Complete    HRI or Home Care Consult   Complete  Social Work Consult for Recovery Care Planning/Counseling   Complete  Palliative Care Screening   Not Applicable  Medication Review Oceanographer)  Complete Complete  HRI or Home Care Consult  Complete   SW Recovery Care/Counseling Consult  Complete   Palliative Care Screening  Not Applicable   Skilled Nursing Facility  Complete

## 2022-07-26 NOTE — Consult Note (Addendum)
WOC Nurse Consult Note: patient is followed by [facility] for wounds to feet; last seen 07/19/2022 use of hydrofera blue and padding noted on review of records  Reason for Consult: bilateral foot wounds  Wound type: 1.  Partial thickness L medial hallux bony prominence likely from rubbing in shoe 100% pink and moist  2.  L foot full thickness between 4th and 5th digits dark soft eschar  3.  Partial thickness between 3rd and 4th and 4th and 5th digits 100%  pink and moist  Pressure Injury POA: NA  Drainage (amount, consistency, odor) minimal serous from pink moist partial thickness  Periwound: erythema to L and R medial hallux, in between toes moist and macerated  Dressing procedure/placement/frequency: Clean feet with soap and water, dry in between toes with gauze.  Cut a small piece of Silver Hydrofiber [place] [id number])  daily to fit wound beds L medial hallux and in between L 4th and 5th digits and R 3rd, 4th, and 5th digits.  Weave gauze in between toes to keep dry and separated or utilize toe separators that patient is already wearing.  Would cover L and R medial hallux prominence with silicone foam.  May lift foam daily to change silver.  Change foam dressing q3 days and prn soiling. MOISTEN SILVER WITH NS IF STUCK TO WOUND BED FOR ATRAUMATIC REMOVAL.   POC discussed with patient and bedside nurse. WOC team will not follow at this time. Re-consult if further needs arise.   Thank you,    Priscella Mann MSN, RN-BC, [employer] [phone]

## 2022-07-26 NOTE — Evaluation (Signed)
Occupational Therapy Evaluation Patient Details Name: [name] MRN: [id number] DOB: 1934/09/23 Today's Date: 07/26/2022   History of Present Illness [name] is a 88 y.o. female with medical history significant for  CHF, COPD, pulmonary fibrosis, dementia.       Patient's was transferred from [facility] with reports of a fall, with subsequent CT lumbar spine with findings of severe compression fracture at L1 and L2.  She was also diagnosed with UTI, started on Rocephin.  Patient was admitted to hospitalist service this morning at about 7 AM, with the above diagnosis.  Neurosurgeon Dr. Christell Constant was consulted, and said patient's fractures were nonsurgical.  Patient was admitted for pain control and UTI.  She was also given IV morphine with drop in O2 sats to 90%, she was placed on 2 L.  Vitals were stable, with temperature of 97.3, blood pressure 132/68, heart rate of 85, respirate rate of 18, O2 sats of 98% on 2 L.  Hospitalist reported that daughter did not want patient admitted at [facility] and requested patient be transferred to [facility]. (per MD)   Clinical Impression   Pt agreeable to OT and PT co-evaluation. Pt demonstrates need for min to mod A for bed mobility and min A for transfers. Pt assisted for much of ADL's at baseline. Max A for peri-care today. Pt has limited B UE A/ROM at baseline. Pt reported has 24/7 assist at home and feels comfortable with going home. Pt will benefit from continued OT in the hospital and recommended venue below to increase strength, balance, and endurance for safe ADL's.         Recommendations for follow up therapy are one component of a multi-disciplinary discharge planning process, led by the attending physician.  Recommendations may be updated based on patient status, additional functional criteria and insurance authorization.   Assistance Recommended at Discharge Frequent or constant Supervision/Assistance  Patient can return  home with the following A little help with walking and/or transfers;A lot of help with bathing/dressing/bathroom;Assistance with cooking/housework;Assistance with feeding;Assist for transportation;Help with stairs or ramp for entrance    Functional Status Assessment  Patient has had a recent decline in their functional status and demonstrates the ability to make significant improvements in function in a reasonable and predictable amount of time.  Equipment Recommendations  None recommended by OT    Recommendations for Other Services       Precautions / Restrictions Precautions Precautions: Fall Restrictions Weight Bearing Restrictions: No      Mobility Bed Mobility Overal bed mobility: Needs Assistance Bed Mobility: Sidelying to Sit   Sidelying to sit: Min assist, Mod assist       General bed mobility comments: Slow labored movement    Transfers Overall transfer level: Needs assistance Equipment used: Rolling walker (2 wheels) Transfers: Sit to/from Stand, Bed to chair/wheelchair/BSC Sit to Stand: Min assist     Step pivot transfers: Min assist     General transfer comment: Labored movement. Extended time.      Balance Overall balance assessment: Needs assistance Sitting-balance support: Feet supported, No upper extremity supported Sitting balance-Leahy Scale: Good Sitting balance - Comments: seated at EOB   Standing balance support: Bilateral upper extremity supported, During functional activity, Reliant on assistive device for balance Standing balance-Leahy Scale: Fair Standing balance comment: using RW                           ADL  either performed or assessed with clinical judgement   ADL Overall ADL's : Needs assistance/impaired Eating/Feeding: Set up;Sitting   Grooming: Minimal assistance;Sitting;Set up   Upper Body Bathing: Minimal assistance;Moderate assistance;Sitting   Lower Body Bathing: Maximal assistance;Sitting/lateral leans    Upper Body Dressing : Minimal assistance;Moderate assistance;Sitting   Lower Body Dressing: Maximal assistance;Sitting/lateral leans   Toilet Transfer: Minimal assistance;Stand-pivot;Ambulation;Rolling walker (2 wheels) Toilet Transfer Details (indicate cue type and reason): Chair to [facility]; simulated via ambulation in hall. Toileting- Clothing Manipulation and Hygiene: Maximal assistance;Sit to/from stand Toileting - Clothing Manipulation Details (indicate cue type and reason): Assited in peri-care after urination while standing.     Functional mobility during ADLs: Minimal assistance;Min guard;Rolling walker (2 wheels) General ADL Comments: Able to ambulate in hall >50 feet.     Vision Baseline Vision/History: 4 Cataracts Ability to See in Adequate Light: 1 Impaired Patient Visual Report: No change from baseline Vision Assessment?: No apparent visual deficits Additional Comments: outside of baseline issues                Pertinent Vitals/Pain Pain Assessment Pain Assessment: No/denies pain     Hand Dominance Right   Extremity/Trunk Assessment Upper Extremity Assessment Upper Extremity Assessment: Generalized weakness (2+/5 bilateral shoulders. L less A/ROM and P/ROM than R UE. Generally weak otherwise.)   Lower Extremity Assessment Lower Extremity Assessment: Defer to PT evaluation   Cervical / Trunk Assessment Cervical / Trunk Assessment: Kyphotic   Communication Communication Communication: No difficulties   Cognition Arousal/Alertness: Awake/alert Behavior During Therapy: WFL for tasks assessed/performed Overall Cognitive Status: Within Functional Limits for tasks assessed                                                        Home Living Family/patient expects to be discharged to:: Private residence Living Arrangements: Children Available Help at Discharge: Personal care attendant;Family;Available 24 hours/day Type of Home:  Other(Comment) (double wide) Home Access: Ramped entrance     Home Layout: One level     Bathroom Shower/Tub: Producer, television/film/video: Handicapped height Bathroom Accessibility: Yes How Accessible: Accessible via walker Home Equipment: Shower seat;Hand held shower head;Grab bars - tub/shower;Grab bars - toilet;Rolling Walker (2 wheels);Hospital bed;Wheelchair - manual;BSC/3in1   Additional Comments: Pt reported no change in living history.      Prior Functioning/Environment Prior Level of Function : Needs assist       Physical Assist : ADLs (physical);Mobility (physical) Mobility (physical): Transfers;Gait;Stairs ADLs (physical): Feeding;Grooming;Bathing;Dressing;Toileting;IADLs Mobility Comments: household distances with RW and aide present if assistance needed ADLs Comments: has home aides 8 hours/day x 5 days/week, Assist with ADL's and IADL's.        OT Problem List: Decreased strength;Decreased range of motion;Decreased activity tolerance;Impaired balance (sitting and/or standing)      OT Treatment/Interventions: Self-care/ADL training;Therapeutic exercise;Therapeutic activities;Patient/family education;Balance training    OT Goals(Current goals can be found in the care plan section) Acute Rehab OT Goals Patient Stated Goal: return home OT Goal Formulation: With patient Time For Goal Achievement: 08/09/22 Potential to Achieve Goals: Good  OT Frequency: Min 1X/week    Co-evaluation PT/OT/SLP Co-Evaluation/Treatment: Yes Reason for Co-Treatment: To address functional/ADL transfers   OT goals addressed during session: ADL's and self-care      AM-PAC OT "6 Clicks" Daily Activity     Outcome  Measure Help from another person eating meals?: A Little Help from another person taking care of personal grooming?: A Little Help from another person toileting, which includes using toliet, bedpan, or urinal?: A Lot Help from another person bathing (including  washing, rinsing, drying)?: A Lot Help from another person to put on and taking off regular upper body clothing?: A Little Help from another person to put on and taking off regular lower body clothing?: A Lot 6 Click Score: 15   End of Session Equipment Utilized During Treatment: Rolling walker (2 wheels);Gait belt  Activity Tolerance: Patient tolerated treatment well Patient left: in chair;with call bell/phone within reach  OT Visit Diagnosis: Unsteadiness on feet (R26.81);Other abnormalities of gait and mobility (R26.89);Muscle weakness (generalized) (M62.81)                Time: 1761-6073 OT Time Calculation (min): 23 min Charges:  OT General Charges $OT Visit: 1 Visit OT Evaluation $OT Eval Low Complexity: 1 Low  Lindzey Zent OT, MOT   Danie Chandler 07/26/2022, 9:39 AM

## 2022-07-26 NOTE — Assessment & Plan Note (Signed)
Monitoring closely as needed Haldol Xanax versus Ativan

## 2022-07-26 NOTE — Assessment & Plan Note (Signed)
-  Also diagnosed with UTI, obtain UA, [facility], continue IV Rocephin started at [facility]  -Obtaining repeat UA possible culture

## 2022-07-26 NOTE — Plan of Care (Signed)
  Problem: Acute Rehab OT Goals (only OT should resolve) Goal: Pt. Will Perform Grooming Flowsheets (Taken 07/26/2022 0940) Pt Will Perform Grooming: with supervision Goal: Pt. Will Perform Upper Body Dressing Flowsheets (Taken 07/26/2022 0940) Pt Will Perform Upper Body Dressing: with set-up Goal: Pt. Will Transfer To Toilet Flowsheets (Taken 07/26/2022 0940) Pt Will Transfer to Toilet:  with modified independence  with supervision Goal: Pt. Will Perform Toileting-Clothing Manipulation Flowsheets (Taken 07/26/2022 0940) Pt Will Perform Toileting - Clothing Manipulation and hygiene: with set-up Goal: Pt/Caregiver Will Perform Home Exercise Program Flowsheets (Taken 07/26/2022 0940) Pt/caregiver will Perform Home Exercise Program:  Increased ROM  Increased strength  Both right and left upper extremity  Independently  Ahlijah Raia OT, MOT

## 2022-07-26 NOTE — [place] FL2 (Signed)
[place] MEDICAID FL2 LEVEL OF CARE FORM     IDENTIFICATION  Patient Name: [name] Birthdate: [birth year] Sex: female Admission Date (Current Location): 07/25/2022  [facility] and [place] Number:  [employer] and Address:  [facility] ([facility]  [address], [place] [address]      Provider Number: [id number]  Attending Physician Name and Address:  Kendell Bane, MD  Relative Name and Phone Number:  Renne Musca (Daughter)  [phone]    Current Level of Care: Hospital Recommended Level of Care: Skilled Nursing Facility Prior Approval Number:    Date Approved/Denied:   PASRR Number: [id number]  Discharge Plan: SNF    Current Diagnoses: Patient Active Problem List   Diagnosis Date Noted   Pressure injury of skin 07/26/2022   Closed L1 vertebral fracture (HCC) 07/26/2022   Fall 07/25/2022   Compression fracture of L1,L2 vertebra (HCC) 07/25/2022   Influenza A 04/28/2022   Hypoxia 04/23/2022   Anemia, unspecified 11/23/2021   Long term (current) use of inhaled steroids 11/23/2021   Unspecified dementia, unspecified severity, with mood disturbance (HCC) 11/23/2021   Fracture of femoral neck, left, closed (HCC) 09/24/2021   Acute respiratory failure with hypoxia (HCC) 06/10/2021   Tobacco use disorder 06/10/2021   Generalized weakness 06/10/2021   Bilateral conjunctivitis 06/10/2021   Chronic diastolic CHF (congestive heart failure) (HCC) 05/14/2021   Depression 05/14/2021   Hyperlipidemia    COPD exacerbation (HCC) 05/13/2021   Long term (current) use of opiate analgesic 01/04/2021   Chronic pain 04/23/2019   General unsteadiness 04/23/2019   Polyarthropathy 04/23/2019   Pseudobulbar affect 04/23/2019   Tremor 04/23/2019   Chronic obstructive pulmonary disease, unspecified (HCC)    Gastro-esophageal reflux disease without esophagitis    Hyperlipidemia, unspecified    Pulmonary fibrosis, unspecified (HCC)    Acute on chronic  diastolic (congestive) heart failure (HCC)    Age-related osteoporosis without current pathological fracture    Anxiety disorder, unspecified    Chest pain, unspecified    Chronic diastolic (congestive) heart failure (HCC)    Collapsed vertebra, not elsewhere classified, site unspecified, subsequent encounter for fracture with routine healing    Edema, unspecified    Lumbago with sciatica    Overactive bladder    Pain in unspecified hand    Pressure ulcer of unspecified site, unspecified stage    Unspecified protein-calorie malnutrition (HCC)    Acute diastolic heart failure (HCC) 09/30/2018   Dyspnea 09/29/2018   Pneumonia 06/30/2018   Acute CHF (congestive heart failure) (HCC) 06/29/2018   Vascular dementia without behavioral disturbance (HCC) 06/26/2018   CAP (community acquired pneumonia) 06/23/2018   Bilateral pleural effusion 06/23/2018   Leukocytosis 06/20/2018   Chronic anemia 06/20/2018   Dementia (HCC) 06/19/2018   Bilateral lower extremity edema 06/18/2018   Protein-calorie malnutrition, severe (HCC) 06/18/2018   S/P internal fixation right hip fracture cannulated screw placement 04/06/18 04/27/2018   UTI (urinary tract infection) 04/11/2018   Altered mental status 04/10/2018   Anxiety 04/10/2018   Hyponatremia 04/10/2018   UTI (urinary tract infection) due to urinary indwelling catheter (HCC) 04/10/2018   Chronic constipation 04/09/2018   Urinary retention 04/09/2018   Closed right hip fracture (HCC) 04/05/2018   GERD (gastroesophageal reflux disease) 04/05/2018   COPD with acute exacerbation (HCC) 04/05/2018   Hiatal hernia    Dyspepsia 04/04/2014   Hematochezia 04/30/2011   RUQ pain 03/25/2011   Adenomatous polyps 03/25/2011   ABDOMINAL PAIN, LEFT LOWER QUADRANT 10/16/2009   HYPERLIPIDEMIA 10/09/2009  BRONCHITIS 10/09/2009   Osteoporosis 10/09/2009   Personal history of pneumonia (recurrent) 01/05/1999   Mixed hyperlipidemia 01/05/1999    Orientation  RESPIRATION BLADDER Height & Weight     Self  Normal Incontinent Weight: 49.8 kg Height:     BEHAVIORAL SYMPTOMS/MOOD NEUROLOGICAL BOWEL NUTRITION STATUS      Continent Diet (See DC summary)  AMBULATORY STATUS COMMUNICATION OF NEEDS Skin   Extensive Assist (Closed L1 vertebral fracture) Verbally Normal                       Personal Care Assistance Level of Assistance  Bathing, Feeding, Dressing Bathing Assistance: Maximum assistance Feeding assistance: Independent Dressing Assistance: Limited assistance     Functional Limitations Info  Sight, Hearing, Speech Sight Info: Adequate Hearing Info: Impaired Speech Info: Adequate    SPECIAL CARE FACTORS FREQUENCY  PT (By licensed PT)     PT Frequency: 5 times a week              Contractures Contractures Info: Not present    Additional Factors Info  Code Status Code Status Info: DNR             Current Medications (07/26/2022):  This is the current hospital active medication list Current Facility-Administered Medications  Medication Dose Route Frequency Provider Last Rate Last Admin   acetaminophen (TYLENOL) tablet 650 mg  650 mg Oral Q6H PRN Nevin Bloodgood A, MD   650 mg at 07/25/22 2201   Or   acetaminophen (TYLENOL) suppository 650 mg  650 mg Rectal Q6H PRN Shahmehdi, Gemma Payor, MD       ALPRAZolam Prudy Feeler) tablet 0.5 mg  0.5 mg Oral BID Shahmehdi, Seyed A, MD   0.5 mg at 07/26/22 0947   cefTRIAXone (ROCEPHIN) 1 g in sodium chloride 0.9 % 100 mL IVPB  1 g Intravenous Q24H Shahmehdi, Seyed A, MD 200 mL/hr at 07/26/22 0956 1 g at 07/26/22 0956   donepezil (ARICEPT) tablet 10 mg  10 mg Oral Daily Shahmehdi, Seyed A, MD   10 mg at 07/26/22 0947   enoxaparin (LOVENOX) injection 30 mg  30 mg Subcutaneous Q24H Shahmehdi, Seyed A, MD   30 mg at 07/25/22 2152   fluticasone furoate-vilanterol (BREO ELLIPTA) 100-25 MCG/ACT 1 puff  1 puff Inhalation q morning Shahmehdi, Seyed A, MD   1 puff at 07/26/22 0728   furosemide  (LASIX) tablet 40 mg  40 mg Oral Daily Shahmehdi, Seyed A, MD   40 mg at 07/26/22 0947   ibuprofen (ADVIL) tablet 600 mg  600 mg Oral Q6H PRN Nevin Bloodgood A, MD   600 mg at 07/26/22 0951   ipratropium-albuterol (DUONEB) 0.5-2.5 (3) MG/3ML nebulizer solution 3 mL  3 mL Nebulization Q4H PRN Shahmehdi, Seyed A, MD       lidocaine (LIDODERM) 5 % 1 patch  1 patch Transdermal Daily PRN Shahmehdi, Seyed A, MD       memantine (NAMENDA) tablet 10 mg  10 mg Oral BID Shahmehdi, Seyed A, MD   10 mg at 07/26/22 0947   ondansetron (ZOFRAN) tablet 4 mg  4 mg Oral Q6H PRN Shahmehdi, Seyed A, MD       Or   ondansetron (ZOFRAN) injection 4 mg  4 mg Intravenous Q6H PRN Shahmehdi, Seyed A, MD       pantoprazole (PROTONIX) EC tablet 40 mg  40 mg Oral BID Shahmehdi, Seyed A, MD   40 mg at 07/26/22 0947   polyethylene glycol (MIRALAX /  GLYCOLAX) packet 17 g  17 g Oral Daily PRN Shahmehdi, Seyed A, MD       traMADol (ULTRAM) tablet 50 mg  50 mg Oral Q6H PRN Shahmehdi, Seyed A, MD   50 mg at 07/25/22 2041   zolpidem (AMBIEN) tablet 5 mg  5 mg Oral QHS PRN Kendell Bane, MD   5 mg at 07/25/22 2200     Discharge Medications: Please see discharge summary for a list of discharge medications.  Relevant Imaging Results:  Relevant Lab Results:   Additional Information SS# [id number]  Leitha Bleak, RN

## 2022-07-26 NOTE — Evaluation (Addendum)
Physical Therapy Evaluation Patient Details Name: [name] MRN: [id number] DOB: 1934-11-11 Today's Date: 07/26/2022  History of Present Illness  [name] is a 88 y.o. female with medical history significant for  CHF, COPD, pulmonary fibrosis, dementia.       Patient's was transferred from [facility] with reports of a fall, with subsequent CT lumbar spine with findings of severe compression fracture at L1 and L2.  She was also diagnosed with UTI, started on Rocephin.  Patient was admitted to hospitalist service this morning at about 7 AM, with the above diagnosis.  Neurosurgeon Dr. Christell Constant was consulted, and said patient's fractures were nonsurgical.  Patient was admitted for pain control and UTI.  She was also given IV morphine with drop in O2 sats to 90%, she was placed on 2 L.  Vitals were stable, with temperature of 97.3, blood pressure 132/68, heart rate of 85, respirate rate of 18, O2 sats of 98% on 2 L.  Hospitalist reported that daughter did not want patient admitted at [facility] and requested patient be transferred to [facility].   Clinical Impression  Pt responded well to treatment. Required min assist to transfer from bed to chair and ambulate with RW.  Pt tolerated sitting up in chair after therapy session. Patient will benefit from continued skilled physical therapy in hospital and recommended venue below to increase strength, balance, endurance for safe ADLs and gait.       Assistance Recommended at Discharge Set up Supervision/Assistance  If plan is discharge home, recommend the following:  Can travel by private vehicle  A little help with walking and/or transfers;A little help with bathing/dressing/bathroom;Assistance with cooking/housework;Assist for transportation        Equipment Recommendations Rolling walker (2 wheels)  Recommendations for Other Services       Functional Status Assessment Patient has had a recent decline in their functional  status and demonstrates the ability to make significant improvements in function in a reasonable and predictable amount of time.     Precautions / Restrictions Precautions Precautions: Fall Restrictions Weight Bearing Restrictions: No      Mobility  Bed Mobility Overal bed mobility: Needs Assistance Bed Mobility: Sidelying to Sit   Sidelying to sit: Min assist, Mod assist       General bed mobility comments: Slow labored movement    Transfers Overall transfer level: Needs assistance Equipment used: Rolling walker (2 wheels) Transfers: Sit to/from Stand, Bed to chair/wheelchair/BSC Sit to Stand: Min assist   Step pivot transfers: Min assist       General transfer comment: Labored movement. Extended time.    Ambulation/Gait Ambulation/Gait assistance: Modified independent (Device/Increase time) Gait Distance (Feet): 65 Feet Assistive device: Rolling walker (2 wheels) Gait Pattern/deviations: Step-through pattern, Decreased step length - right, Decreased step length - left, Decreased stride length Gait velocity: decreased     General Gait Details: RW  Stairs            Wheelchair Mobility     Tilt Bed    Modified Rankin (Stroke Patients Only)       Balance Overall balance assessment: Needs assistance Sitting-balance support: Feet supported, No upper extremity supported Sitting balance-Leahy Scale: Good Sitting balance - Comments: seated at EOB   Standing balance support: Bilateral upper extremity supported, During functional activity, Reliant on assistive device for balance Standing balance-Leahy Scale: Fair Standing balance comment: using RW  Pertinent Vitals/Pain Pain Assessment Pain Assessment: No/denies pain    Home Living Family/patient expects to be discharged to:: Private residence Living Arrangements: Children Available Help at Discharge: Personal care attendant;Family;Available 24  hours/day Type of Home: Other(Comment) Home Access: Ramped entrance       Home Layout: One level Home Equipment: Shower seat;Hand held shower head;Grab bars - tub/shower;Grab bars - toilet;Rolling Walker (2 wheels);Hospital bed;Wheelchair - manual;BSC/3in1 Additional Comments: Pt reported no change in living history.    Prior Function Prior Level of Function : Needs assist       Physical Assist : ADLs (physical);Mobility (physical) Mobility (physical): Transfers;Gait;Stairs ADLs (physical): Feeding;Grooming;Bathing;Dressing;Toileting;IADLs Mobility Comments: RW and aide present if needed ADLs Comments: has home aides 8 hours/day x 5 days/week, Assist with ADL's and IADL's.     Hand Dominance   Dominant Hand: Right    Extremity/Trunk Assessment   Upper Extremity Assessment Upper Extremity Assessment: Generalized weakness (2+/5 bilateral shoulders. L less A/ROM and P/ROM than R UE. Generally weak otherwise.)    Lower Extremity Assessment Lower Extremity Assessment: Defer to PT evaluation    Cervical / Trunk Assessment Cervical / Trunk Assessment: Kyphotic  Communication   Communication: No difficulties  Cognition Arousal/Alertness: Awake/alert Behavior During Therapy: WFL for tasks assessed/performed Overall Cognitive Status: Within Functional Limits for tasks assessed                                          General Comments      Exercises     Assessment/Plan    PT Assessment Patient needs continued PT services  PT Problem List Decreased strength;Decreased activity tolerance;Decreased balance;Decreased mobility       PT Treatment Interventions DME instruction;Therapeutic exercise;Gait training;Functional mobility training;Therapeutic activities;Balance training    PT Goals (Current goals can be found in the Care Plan section)  Acute Rehab PT Goals Patient Stated Goal: Return home with assist of family PT Goal Formulation: With  patient Time For Goal Achievement: 08/09/22 Potential to Achieve Goals: Good    Frequency Min 3X/week     Co-evaluation PT/OT/SLP Co-Evaluation/Treatment: Yes Reason for Co-Treatment: To address functional/ADL transfers PT goals addressed during session: Mobility/safety with mobility;Balance;Proper use of DME OT goals addressed during session: ADL's and self-care       AM-PAC PT "6 Clicks" Mobility  Outcome Measure Help needed turning from your back to your side while in a flat bed without using bedrails?: A Little Help needed moving from lying on your back to sitting on the side of a flat bed without using bedrails?: A Little Help needed moving to and from a bed to a chair (including a wheelchair)?: A Little Help needed standing up from a chair using your arms (e.g., wheelchair or bedside chair)?: A Little Help needed to walk in hospital room?: A Little Help needed climbing 3-5 steps with a railing? : A Lot 6 Click Score: 17    End of Session Equipment Utilized During Treatment: Gait belt Activity Tolerance: Patient tolerated treatment well;Patient limited by fatigue Patient left: in chair;with call bell/phone within reach Nurse Communication: Mobility status PT Visit Diagnosis: Unsteadiness on feet (R26.81);Other abnormalities of gait and mobility (R26.89);Muscle weakness (generalized) (M62.81);History of falling (Z91.81)    Time: 1610-9604 PT Time Calculation (min) (ACUTE ONLY): 29 min   Charges:   PT Evaluation $PT Eval Moderate Complexity: 1 Mod PT Treatments $Therapeutic Activity: 23-37 mins PT General Charges $$  ACUTE PT VISIT: 1 Visit         Leonce Bale SPT [place], [place] Program

## 2022-07-26 NOTE — Progress Notes (Signed)
PROGRESS NOTE    Patient: [name]                            PCP: Marylynn Pearson, FNP                    DOB: 08-11-1934            DOA: 07/25/2022 WGN:[id number]             DOS: 07/26/2022, 9:27 AM   LOS: 1 day   Date of Service: The patient was seen and examined on 07/26/2022  Subjective:   The patient was seen and examined this morning. Hemodynamically stable. No issues overnight .  Brief Narrative:   [name] is a 88 y.o. female with medical history significant for  CHF, COPD, pulmonary fibrosis, dementia.     Patient's was transferred from [facility] with reports of a fall, with subsequent CT lumbar spine with findings of severe compression fracture at L1 and L2.  She was also diagnosed with UTI, started on Rocephin.  Patient was admitted to hospitalist service this morning at about 7 AM, with the above diagnosis.  Neurosurgeon Dr. Christell Constant was consulted, and said patient's fractures were nonsurgical.  Patient was admitted for pain control and UTI.  She was also given IV morphine with drop in O2 sats to 90%, she was placed on 2 L. Vitals were stable, with temperature of 97.3, blood pressure 132/68, heart rate of 85, respirate rate of 18, O2 sats of 98% on 2 L. Hospitalist reported that daughter did not want patient admitted at [facility] and requested patient be transferred to [facility].   Patient lives at home with multiple family members, ambulates with a walker at baseline.  Daughter- [name] at bedside.  She reports that patient has been having urinary frequency without dysuria over the past several days.  She has also been complaining of pain to her right wrist since fall.- 7/19.  Fall was unwitnessed, patient did not hit her head.  She was unable to get up after the fall.  At the time of my evaluation, patient is awake alert oriented to person, and able to answer simple questions.  She does not know why she fell.  She denies dizziness.  No chest pain  no difficulty breathing.  She denies pain with urination.      Assessment & Plan:   Principal Problem:   Fall Active Problems:   UTI (urinary tract infection)   Compression fracture of L1,L2 vertebra (HCC)   Depression   Dementia (HCC)   Chronic obstructive pulmonary disease, unspecified (HCC)   Pulmonary fibrosis, unspecified (HCC)   Anxiety disorder, unspecified   Chronic diastolic (congestive) heart failure (HCC)   Pressure injury of skin     Assessment and Plan: * Fall -Fall with subsequent compression fracture-L1-L2,.   - Per HPI: Signout from [facility] hospitalist Dr. Vena Austria.   No records available at this point -Patient appears comfortable, family reports intolerance(hypoxia) to morphine, Dilaudid or similar opiates -Tylenol, lidocaine patch, as needed tramadol -Request records from [facility] - PT/OT/ TOC  eval     Compression fracture of L1,L2 vertebra (HCC) - Pain control, PT/OT eval. - Obtain records including imaging records from [facility] health -If her condition worsens we will repeat imaging -Follow-up as outpatient   UTI (urinary tract infection) -Also diagnosed with UTI, obtain UA, [facility],  continue IV Rocephin started at [facility].  -Obtaining repeat UA possible culture  Chronic diastolic (congestive) heart failure (HCC) Stable and compensated.   Last echo 04/2022 EF 55 to 60%, grade 1 DD. -Resume home Lasix -Monitoring daily weight Filed Weights   07/25/22 1843  Weight: 49.8 kg     Anxiety disorder, unspecified Monitoring closely as needed Haldol Xanax versus Ativan  Chronic obstructive pulmonary disease, unspecified (HCC) Stable. Satting 100% on 2 L of oxygen -Resume home regimen  Dementia (HCC) Stable.   Lives at home with several family members. -Resume donepezil, memantine -Obtaining PT OT, -consulting TOC possible arrangement for home health vs SNF  placement             ----------------------------------------------------------------------------------------------------------------------------------------------- Nutritional status:  The patient's BMI is: Body mass index is 24.61 kg/m. I agree with the assessment and plan as outlined   Skin Assessment: I have examined the patient's skin and I agree with the wound assessment as performed by wound care team As outlined belowe: Pressure Injury 07/25/22 Toe (Comment  which one) Anterior;Right Stage 2 -  Partial thickness loss of dermis presenting as a shallow open injury with a red, pink wound bed without slough. dry healing wound.  treated by wound nurse at home  fourth toe med (Active)  07/25/22 1830  Location: Toe (Comment  which one)  Location Orientation: Anterior;Right  Staging: Stage 2 -  Partial thickness loss of dermis presenting as a shallow open injury with a red, pink wound bed without slough.  Wound Description (Comments): dry healing wound.  treated by wound nurse at home  fourth toe medial surface 0.5 by 0.25  Present on Admission:   Dressing Type Foam - Lift dressing to assess site every shift 07/25/22 1946     Pressure Injury 07/25/22 Toe (Comment  which one) Right Stage 2 -  Partial thickness loss of dermis presenting as a shallow open injury with a red, pink wound bed without slough. dry small healing wound , fifth toe medial surface, treated at home by woun (Active)  07/25/22 1830  Location: Toe (Comment  which one)  Location Orientation: Right  Staging: Stage 2 -  Partial thickness loss of dermis presenting as a shallow open injury with a red, pink wound bed without slough.  Wound Description (Comments): dry small healing wound , fifth toe medial surface, treated at home by wound nurse  Present on Admission: Yes  Dressing Type Foam - Lift dressing to assess site every shift 07/25/22 1835     Pressure Injury 07/25/22 Toe (Comment  which one) Left Stage 2 -   Partial thickness loss of dermis presenting as a shallow open injury with a red, pink wound bed without slough. dry healing wound, 0.5 by 0.5 cm   treated at home by wound nurse (Active)  07/25/22 1830  Location: Toe (Comment  which one)  Location Orientation: Left  Staging: Stage 2 -  Partial thickness loss of dermis presenting as a shallow open injury with a red, pink wound bed without slough.  Wound Description (Comments): dry healing wound, 0.5 by 0.5 cm   treated at home by wound nurse  Present on Admission: Yes  Dressing Type Foam - Lift dressing to assess site every shift 07/25/22 1946   ------------------------------------------------------------------------------------------------------------------------------- Cultures;  Urine Culture  >>> NGT   ---------------------------------------------------------------------------------------------------------------------------------  DVT prophylaxis:  enoxaparin (LOVENOX) injection 30 mg Start: 07/25/22 2200   Code Status:   Code Status: DNR Consulting palliative care for discussion regarding  goals of care  Family Communication: No family member present at bedside- attempt will be made to update daily   Admission status:   Status is: Observation The patient remains OBS appropriate and will d/c before 2 midnights.   Disposition: From  - home             Planning for discharge in 1-2 days: to   Procedures:   No admission procedures for hospital encounter.   Antimicrobials:  Anti-infectives (From admission, onward)    Start     Dose/Rate Route Frequency Ordered Stop   07/26/22 0800  cefTRIAXone (ROCEPHIN) 1 g in sodium chloride 0.9 % 100 mL IVPB        1 g 200 mL/hr over 30 Minutes Intravenous Every 24 hours 07/25/22 2024     07/25/22 2115  cefTRIAXone (ROCEPHIN) 1 g in sodium chloride 0.9 % 100 mL IVPB  Status:  Discontinued        1 g 200 mL/hr over 30 Minutes Intravenous Every 24 hours 07/25/22 2024 07/25/22 2024         Medication:   ALPRAZolam  0.5 mg Oral BID   donepezil  10 mg Oral Daily   enoxaparin (LOVENOX) injection  30 mg Subcutaneous Q24H   fluticasone furoate-vilanterol  1 puff Inhalation q morning   furosemide  40 mg Oral Daily   memantine  10 mg Oral BID   pantoprazole  40 mg Oral BID    acetaminophen **OR** acetaminophen, ibuprofen, ipratropium-albuterol, lidocaine, ondansetron **OR** ondansetron (ZOFRAN) IV, polyethylene glycol, traMADol, zolpidem   Objective:   Vitals:   07/25/22 2227 07/26/22 0208 07/26/22 0532 07/26/22 0728  BP: (!) 93/59 108/73 99/63   Pulse: 81 73 76   Resp: 20 18 20    Temp: 98.1 F (36.7 C) 98 F (36.7 C) 98.3 F (36.8 C)   TempSrc: Oral Oral Oral   SpO2: 94% 97% 100% 100%  Weight:        Intake/Output Summary (Last 24 hours) at 07/26/2022 0927 Last data filed at 07/26/2022 0700 Gross per 24 hour  Intake --  Output 300 ml  Net -300 ml   Filed Weights   07/25/22 1843  Weight: 49.8 kg     Physical examination:   Constitution: Somnolent this a.m.  HEENT:        Normocephalic, PERRL, otherwise with in Normal limits  Chest:         Chest symmetric Cardio vascular:  S1/S2, RRR, No murmure, No Rubs or Gallops  pulmonary: Clear to auscultation bilaterally, respirations unlabored, negative wheezes / crackles Abdomen: Soft, non-tender, non-distended, bowel sounds,no masses, no organomegaly Muscular skeletal: Limited exam - in bed, able to move all 4 extremities,   Neuro: CNII-XII intact. , normal motor and sensation, reflexes intact  Extremities: No pitting edema lower extremities, +2 pulses  Skin: Dry, warm to touch, negative for any Rashes, No open wounds Wounds: per nursing documentation   ------------------------------------------------------------------------------------------------------------------------------------------    LABs:     Latest Ref Rng & Units 07/25/2022    7:58 PM 04/28/2022    3:46 AM 04/26/2022    4:57 AM  CBC   WBC 4.0 - 10.5 K/uL 8.1  7.2  11.5   Hemoglobin 12.0 - 15.0 g/dL 9.7  16.1  09.6   Hematocrit 36.0 - 46.0 % 30.6  36.7  35.8   Platelets 150 - 400 K/uL 228  263  222       Latest Ref Rng & Units 07/25/2022  7:58 PM 04/29/2022    4:12 AM 04/28/2022    5:39 AM  CMP  Glucose 70 - 99 mg/dL 161     BUN 8 - 23 mg/dL 20     Creatinine 0.96 - 1.00 mg/dL 0.45     Sodium 409 - 811 mmol/L 135     Potassium 3.5 - 5.1 mmol/L 4.4  4.8  5.1   Chloride 98 - 111 mmol/L 103     CO2 22 - 32 mmol/L 25     Calcium 8.9 - 10.3 mg/dL 8.2          Micro Results No results found for this or any previous visit (from the past 240 hour(s)).  Radiology Reports DG Wrist Complete Right  Result Date: 07/25/2022 CLINICAL DATA:  Recent fall with wrist pain, initial encounter EXAM: RIGHT WRIST - COMPLETE 3+ VIEW COMPARISON:  None Available. FINDINGS: Degenerative changes are noted in the interphalangeal joints. Old healed fracture in the proximal first metacarpal is noted. Carpal rows are distorted likely related to prior traumatic injury and ligamentous damage. Irregularity of the articular surface of the radius is noted particularly on the lateral film consistent with a minimally displaced fracture of the distal radius. No other focal abnormality is noted. IMPRESSION: Chronic degenerative changes. Irregularity of the articular surface of the distal radius consistent with mildly displaced fracture. Electronically Signed   By: Alcide Clever M.D.   On: 07/25/2022 22:26    SIGNED: Kendell Bane, MD, FHM. FAAFP. [facility] - [facility] hospitalist Time spent - 55 min.  In seeing, evaluating and examining the patient. Reviewing medical records, labs, drawn plan of care. [facility],  Pager (please use [email or web address] to page/ text) Please use Epic Secure Chat for non-urgent communication (7AM-7PM)  If 7PM-7AM, please contact night-coverage [email or web address], 07/26/2022, 9:27 AM

## 2022-07-26 NOTE — Plan of Care (Addendum)
  Problem: Acute Rehab PT Goals(only PT should resolve) Goal: Pt Will Go Supine/Side To Sit Outcome: Progressing Flowsheets (Taken 07/26/2022 1126) Pt will go Supine/Side to Sit: with supervision, min assist Goal: Patient Will Transfer Sit To/From Stand Outcome: Progressing Flowsheets (Taken 07/26/2022 1126) Patient will transfer sit to/from stand: with supervision, min assist Goal: Pt Will Transfer Bed To Chair/Chair To Bed Outcome: Progressing Flowsheets (Taken 07/26/2022 1126) Pt will Transfer Bed to Chair/Chair to Bed: with supervision, min assist Goal: Pt Will Ambulate Outcome: Progressing Flowsheets (Taken 07/26/2022 1126) Pt will Ambulate: 75 feet  Mattix Imhof SPT [place], DPT Program

## 2022-07-26 NOTE — Progress Notes (Signed)
Palliative: Thank you for this consult. Unfortunately due to high volume of consults there will be a delay in a Palliative Provider seeing this patient. Palliative Medicine will return to service on 07/27/2022 and will see patient at that time.  No charge Lillia Carmel, NP Palliative Medicine Please call Palliative Medicine team phone with any questions [phone]. For individual providers please see AMION

## 2022-07-27 ENCOUNTER — Encounter (HOSPITAL_COMMUNITY): Payer: Self-pay | Admitting: Family Medicine

## 2022-07-27 ENCOUNTER — Ambulatory Visit: Payer: Medicare Other | Admitting: Physician Assistant

## 2022-07-27 DIAGNOSIS — Z515 Encounter for palliative care: Secondary | ICD-10-CM | POA: Diagnosis not present

## 2022-07-27 DIAGNOSIS — S32029A Unspecified fracture of second lumbar vertebra, initial encounter for closed fracture: Secondary | ICD-10-CM | POA: Diagnosis not present

## 2022-07-27 DIAGNOSIS — S32019A Unspecified fracture of first lumbar vertebra, initial encounter for closed fracture: Secondary | ICD-10-CM | POA: Diagnosis not present

## 2022-07-27 DIAGNOSIS — Z7189 Other specified counseling: Secondary | ICD-10-CM

## 2022-07-27 DIAGNOSIS — F01511 Vascular dementia, unspecified severity, with agitation: Secondary | ICD-10-CM

## 2022-07-27 DIAGNOSIS — W19XXXD Unspecified fall, subsequent encounter: Secondary | ICD-10-CM | POA: Diagnosis not present

## 2022-07-27 DIAGNOSIS — S32010A Wedge compression fracture of first lumbar vertebra, initial encounter for closed fracture: Secondary | ICD-10-CM | POA: Diagnosis not present

## 2022-07-27 LAB — URINE CULTURE: Culture: >=100000 — AB

## 2022-07-27 MED ORDER — DICLOFENAC SODIUM 1 % EX GEL
2.0000 g | Freq: Four times a day (QID) | CUTANEOUS | Status: DC
  Administered 2022-07-27 – 2022-07-29 (×8): 2 g via TOPICAL
  Filled 2022-07-27 (×3): qty 100

## 2022-07-27 NOTE — TOC Progression Note (Signed)
Transition of Care [facility]) - Progression Note    Patient Details  Name: JOZALYN BAGLIO MRN: [id number] Date of Birth: 1934-03-02  Transition of Care [facility]) CM/SW Contact  Leitha Bleak, RN Phone Number: 07/27/2022, 11:18 AM  Clinical Narrative:   Bed offers discussed with daughter. They want [facility]. Patient will discharge when medically ready, team updated.    Expected Discharge Plan: Skilled Nursing Facility Barriers to Discharge: Continued Medical Work up  Expected Discharge Plan and Services        [facility] Agency: [facility] Date [facility] Agency Contacted: 07/26/22 Time HH Agency Contacted: 1229 Representative spoke with at [facility] Agency: Morrie Sheldon   Social Determinants of Health (SDOH) Interventions SDOH Screenings   Food Insecurity: No Food Insecurity (07/25/2022)  Housing: Low Risk  (07/25/2022)  Transportation Needs: No Transportation Needs (07/25/2022)  Utilities: Not At Risk (07/25/2022)  Tobacco Use: High Risk (07/27/2022)    Readmission Risk Interventions    07/26/2022   12:28 PM 09/30/2021    9:22 AM 06/12/2021   12:11 PM  Readmission Risk Prevention Plan  Transportation Screening Complete Complete Complete  PCP or Specialist Appt within 5-7 Days Not Complete    PCP or Specialist Appt within 3-5 Days   Complete  Home Care Screening Complete    Medication Review (RN CM) Complete    HRI or Home Care Consult   Complete  Social Work Consult for Recovery Care Planning/Counseling   Complete  Palliative Care Screening   Not Applicable  Medication Review Oceanographer)  Complete Complete  HRI or Home Care Consult  Complete   SW Recovery Care/Counseling Consult  Complete   Palliative Care Screening  Not Applicable   Skilled Nursing Facility  Complete

## 2022-07-27 NOTE — Progress Notes (Signed)
PROGRESS NOTE    Patient: [name]                            PCP: Marylynn Pearson, FNP                    DOB: Aug 14, 1934            DOA: 07/25/2022 ZOX:[id number]             DOS: 07/27/2022, 1:45 PM   LOS: 2 days   Date of Service: The patient was seen and examined on 07/27/2022  Subjective:   The patient was seen and examined this morning. Hemodynamically stable. No issues overnight .  Brief Narrative:   [name] is a 88 y.o. female with medical history significant for  CHF, COPD, pulmonary fibrosis, dementia.     Patient's was transferred from [facility] with reports of a fall, with subsequent CT lumbar spine with findings of severe compression fracture at L1 and L2.  She was also diagnosed with UTI, started on Rocephin.  Patient was admitted to hospitalist service this morning at about 7 AM, with the above diagnosis.  Neurosurgeon Dr. Christell Constant was consulted, and said patient's fractures were nonsurgical.  Patient was admitted for pain control and UTI.  She was also given IV morphine with drop in O2 sats to 90%, she was placed on 2 L. Vitals were stable, with temperature of 97.3, blood pressure 132/68, heart rate of 85, respirate rate of 18, O2 sats of 98% on 2 L. Hospitalist reported that daughter did not want patient admitted at [facility] and requested patient be transferred to [facility].   Patient lives at home with multiple family members, ambulates with a walker at baseline.  Daughter- [name] at bedside.  She reports that patient has been having urinary frequency without dysuria over the past several days.  She has also been complaining of pain to her right wrist since fall.- 7/19.  Fall was unwitnessed, patient did not hit her head.  She was unable to get up after the fall.  At the time of my evaluation, patient is awake alert oriented to person, and able to answer simple questions.  She does not know why she fell.  She denies dizziness.  No chest pain  no difficulty breathing.  She denies pain with urination.      Assessment & Plan:   Principal Problem:   Fall Active Problems:   UTI (urinary tract infection)   Compression fracture of L1,L2 vertebra (HCC)   Depression   Dementia (HCC)   Chronic obstructive pulmonary disease, unspecified (HCC)   Pulmonary fibrosis, unspecified (HCC)   Anxiety disorder, unspecified   Chronic diastolic (congestive) heart failure (HCC)   Pressure injury of skin     Assessment and Plan: * Fall -Fall with subsequent compression fracture-L1-L2,.   - Per HPI: Signout from [facility] hospitalist Dr. Vena Austria.   No records available at this point -Patient appears comfortable, family reports intolerance(hypoxia) to morphine, Dilaudid or similar opiates -Tylenol, lidocaine patch, as needed tramadol -Request records from [facility] - PT/OT/ TOC  >>> candidate for SNF       Compression fracture of L1,L2 vertebra (HCC) L1/L2 vertebral compression fracture -not amendable to surgery -Will continue supportive therapy -Sensitive to strong products, -Continue with pain management anti-inflammatories -PT/OT -If her condition worsens we will repeat imaging -Follow-up as outpatient   UTI (urinary  tract infection) -Also diagnosed with UTI, obtain UA, [facility],  - continue IV Rocephin started at [facility].  - UA possible culture - NGTD   Closed L1 vertebral fracture (HCC) L1/L2 vertebral compression fracture -not amendable to surgery -Will continue supportive therapy -Sensitive to strong products, -Continue with pain management anti-inflammatories -PT/OT  Chronic diastolic (congestive) heart failure (HCC) Stable and compensated.   Last echo 04/2022 EF 55 to 60%, grade 1 DD. -Resume home Lasix -Monitoring daily weight Filed Weights   07/25/22 1843  Weight: 49.8 kg     Anxiety disorder, unspecified Monitoring closely as needed Haldol Xanax versus  Ativan  Chronic obstructive pulmonary disease, unspecified (HCC) Stable. Satting 100% on 2 L of oxygen -Resume home regimen  Dementia (HCC) Stable.   Lives at home with several family members. -Resume donepezil, memantine -consulting TOC possible arrangement for home health vs SNF placement    ----------------------------------------------------------------------------------------------------------------------------------------------- Nutritional status:  The patient's BMI is: Body mass index is 24.61 kg/m. I agree with the assessment and plan as outlined   Skin Assessment: I have examined the patient's skin and I agree with the wound assessment as performed by wound care team As outlined belowe: Pressure Injury 07/25/22 Toe (Comment  which one) Anterior;Right Stage 2 -  Partial thickness loss of dermis presenting as a shallow open injury with a red, pink wound bed without slough. dry healing wound.  treated by wound nurse at home  fourth toe med (Active)  07/25/22 1830  Location: Toe (Comment  which one)  Location Orientation: Anterior;Right  Staging: Stage 2 -  Partial thickness loss of dermis presenting as a shallow open injury with a red, pink wound bed without slough.  Wound Description (Comments): dry healing wound.  treated by wound nurse at home  fourth toe medial surface 0.5 by 0.25  Present on Admission:   Dressing Type Foam - Lift dressing to assess site every shift 07/27/22 0800     Pressure Injury 07/25/22 Toe (Comment  which one) Right Stage 2 -  Partial thickness loss of dermis presenting as a shallow open injury with a red, pink wound bed without slough. dry small healing wound , fifth toe medial surface, treated at home by woun (Active)  07/25/22 1830  Location: Toe (Comment  which one)  Location Orientation: Right  Staging: Stage 2 -  Partial thickness loss of dermis presenting as a shallow open injury with a red, pink wound bed without slough.  Wound  Description (Comments): dry small healing wound , fifth toe medial surface, treated at home by wound nurse  Present on Admission: Yes  Dressing Type Foam - Lift dressing to assess site every shift 07/27/22 0800     Pressure Injury 07/25/22 Toe (Comment  which one) Left Stage 2 -  Partial thickness loss of dermis presenting as a shallow open injury with a red, pink wound bed without slough. dry healing wound, 0.5 by 0.5 cm   treated at home by wound nurse (Active)  07/25/22 1830  Location: Toe (Comment  which one)  Location Orientation: Left  Staging: Stage 2 -  Partial thickness loss of dermis presenting as a shallow open injury with a red, pink wound bed without slough.  Wound Description (Comments): dry healing wound, 0.5 by 0.5 cm   treated at home by wound nurse  Present on Admission: Yes  Dressing Type Foam - Lift dressing to assess site every shift 07/27/22 0800   ------------------------------------------------------------------------------------------------------------------------------- Cultures;  Urine Culture  >>>  NGT   ---------------------------------------------------------------------------------------------------------------------------------  DVT prophylaxis:  enoxaparin (LOVENOX) injection 30 mg Start: 07/25/22 2200   Code Status:   Code Status: DNR Consulting palliative care for discussion regarding goals of care  Family Communication: No family member present at bedside- attempt will be made to update daily   Admission status:   Status is: Observation The patient remains OBS appropriate and will d/c before 2 midnights.   Disposition: From  - home             Planning for discharge to SNF in AM   Procedures:   No admission procedures for hospital encounter.   Antimicrobials:  Anti-infectives (From admission, onward)    Start     Dose/Rate Route Frequency Ordered Stop   07/26/22 0800  cefTRIAXone (ROCEPHIN) 1 g in sodium chloride 0.9 % 100 mL IVPB         1 g 200 mL/hr over 30 Minutes Intravenous Every 24 hours 07/25/22 2024     07/25/22 2115  cefTRIAXone (ROCEPHIN) 1 g in sodium chloride 0.9 % 100 mL IVPB  Status:  Discontinued        1 g 200 mL/hr over 30 Minutes Intravenous Every 24 hours 07/25/22 2024 07/25/22 2024        Medication:   ALPRAZolam  0.5 mg Oral BID   donepezil  10 mg Oral Daily   enoxaparin (LOVENOX) injection  30 mg Subcutaneous Q24H   fluticasone furoate-vilanterol  1 puff Inhalation q morning   furosemide  40 mg Oral Daily   memantine  10 mg Oral BID   pantoprazole  40 mg Oral BID    acetaminophen **OR** acetaminophen, ibuprofen, ipratropium-albuterol, lidocaine, ondansetron **OR** ondansetron (ZOFRAN) IV, polyethylene glycol, traMADol, zolpidem   Objective:   Vitals:   07/26/22 1300 07/26/22 2102 07/27/22 0454 07/27/22 0723  BP: 103/66 128/68 (!) 142/76   Pulse: 78 79 77 79  Resp: 20 19 18 20   Temp: 98.4 F (36.9 C) 98.9 F (37.2 C) 98 F (36.7 C)   TempSrc: Oral Oral Oral   SpO2: 97% 93% 96% 94%  Weight:        Intake/Output Summary (Last 24 hours) at 07/27/2022 1345 Last data filed at 07/27/2022 0900 Gross per 24 hour  Intake 460 ml  Output 500 ml  Net -40 ml   Filed Weights   07/25/22 1843  Weight: 49.8 kg     Physical examination:        General:  AAO x 3,  cooperative, no distress;   HEENT:  Normocephalic, PERRL, otherwise with in Normal limits   Neuro:  CNII-XII intact. , normal motor and sensation, reflexes intact   Lungs:   Clear to auscultation BL, Respirations unlabored,  No wheezes / crackles  Cardio:    S1/S2, RRR, No murmure, No Rubs or Gallops   Abdomen:  Soft, non-tender, bowel sounds active all four quadrants, no guarding or peritoneal signs.  Muscular  skeletal:  Limited exam -global generalized weaknesses - in bed, able to move all 4 extremities,   2+ pulses,  symmetric, No pitting edema  Skin:  Dry, warm to touch, the wounds between the toes  Wounds:  Please see nursing documentation -  Pressure Injury 07/25/22 Toe (Comment  which one) Anterior;Right Stage 2 -  Partial thickness loss of dermis presenting as a shallow open injury with a red, pink wound bed without slough. dry healing wound.  treated by wound nurse at home  fourth toe med (Active)  07/25/22  1830  Location: Toe (Comment  which one)  Location Orientation: Anterior;Right  Staging: Stage 2 -  Partial thickness loss of dermis presenting as a shallow open injury with a red, pink wound bed without slough.  Wound Description (Comments): dry healing wound.  treated by wound nurse at home  fourth toe medial surface 0.5 by 0.25  Present on Admission:   Dressing Type Foam - Lift dressing to assess site every shift 07/27/22 0800     Pressure Injury 07/25/22 Toe (Comment  which one) Right Stage 2 -  Partial thickness loss of dermis presenting as a shallow open injury with a red, pink wound bed without slough. dry small healing wound , fifth toe medial surface, treated at home by woun (Active)  07/25/22 1830  Location: Toe (Comment  which one)  Location Orientation: Right  Staging: Stage 2 -  Partial thickness loss of dermis presenting as a shallow open injury with a red, pink wound bed without slough.  Wound Description (Comments): dry small healing wound , fifth toe medial surface, treated at home by wound nurse  Present on Admission: Yes  Dressing Type Foam - Lift dressing to assess site every shift 07/27/22 0800     Pressure Injury 07/25/22 Toe (Comment  which one) Left Stage 2 -  Partial thickness loss of dermis presenting as a shallow open injury with a red, pink wound bed without slough. dry healing wound, 0.5 by 0.5 cm   treated at home by wound nurse (Active)  07/25/22 1830  Location: Toe (Comment  which one)  Location Orientation: Left  Staging: Stage 2 -  Partial thickness loss of dermis presenting as a shallow open injury with a red, pink wound bed without slough.  Wound  Description (Comments): dry healing wound, 0.5 by 0.5 cm   treated at home by wound nurse  Present on Admission: Yes  Dressing Type Foam - Lift dressing to assess site every shift 07/27/22 0800          ------------------------------------------------------------------------------------------------------------------------------------------    LABs:     Latest Ref Rng & Units 07/25/2022    7:58 PM 04/28/2022    3:46 AM 04/26/2022    4:57 AM  CBC  WBC 4.0 - 10.5 K/uL 8.1  7.2  11.5   Hemoglobin 12.0 - 15.0 g/dL 9.7  52.8  41.3   Hematocrit 36.0 - 46.0 % 30.6  36.7  35.8   Platelets 150 - 400 K/uL 228  263  222       Latest Ref Rng & Units 07/25/2022    7:58 PM 04/29/2022    4:12 AM 04/28/2022    5:39 AM  CMP  Glucose 70 - 99 mg/dL 244     BUN 8 - 23 mg/dL 20     Creatinine 0.10 - 1.00 mg/dL 2.72     Sodium 536 - 644 mmol/L 135     Potassium 3.5 - 5.1 mmol/L 4.4  4.8  5.1   Chloride 98 - 111 mmol/L 103     CO2 22 - 32 mmol/L 25     Calcium 8.9 - 10.3 mg/dL 8.2          Micro Results Recent Results (from the past 240 hour(s))  Urine Culture     Status: Abnormal (Preliminary result)   Collection Time: 07/26/22  9:32 AM   Specimen: Urine, Random  Result Value Ref Range Status   Specimen Description   Final    URINE, RANDOM Performed at [facility]  [facility], [address] [place], [place] [address]    Special Requests   Final    NONE Reflexed from [id number] Performed at [facility], [address], [place], [place] [address]    Culture >=100,000 COLONIES/mL ESCHERICHIA COLI (A)  Final   Report Status PENDING  Incomplete    Radiology Reports No results found.  SIGNED: Kendell Bane, MD, FHM. FAAFP. [facility] - [facility] hospitalist Time spent - 55 min.  In seeing, evaluating and examining the patient. Reviewing medical records, labs, drawn plan of care. [facility],  Pager (please use [email or web address] to page/ text) Please use Epic Secure Chat for non-urgent communication  (7AM-7PM)  If 7PM-7AM, please contact night-coverage [email or web address], 07/27/2022, 1:45 PM

## 2022-07-27 NOTE — Consult Note (Signed)
Consultation Note Date: 07/27/2022   Patient Name: [name]  DOB: 1934/04/02  MRN: [id number]  Age / Sex: 88 y.o., female  PCP: Lacey Pearson, FNP Referring Physician: Kendell Bane, MD  Reason for Consultation: Establishing goals of care  HPI/Patient Profile: 88 y.o. female  with past medical history of dementia, CHF, COPD, pulmonary fibrosis, IBS, anxiety, osteoporosis, HLD admitted on 07/25/2022 with fall with subsequent compression fracture L1-L2, also dx with UTI.   Clinical Assessment and Goals of Care: I have reviewed medical records including EPIC notes, labs and imaging, received report from RN, assessed the patient.  [name] is lying quietly in bed.  She appears acutely/chronically ill and frail.  She greets me, making and mostly keeping eye contact.  She has known dementia, orientation questions not asked.  I believe that she can make her basic needs known.  There is no family at bedside at this time.  Call to daughter, [name], to discuss diagnosis prognosis, GOC, EOL wishes, disposition and options.I introduced Palliative Medicine as specialized medical care for people living with serious illness. It focuses on providing relief from the symptoms and stress of a serious illness. The goal is to improve quality of life for both the patient and the family.  We then focused on their current illness.  We talk about [name] follow and compression fractures.  [name] shares her concern about [name] mobility.  She is requesting short-term rehab.  Although [name] has CAP aid for 45 hours/week, she is still falling in the nighttime.  [name] also shares her concerns about wounds and how they will be cared for at short-term rehab.  I encouraged her to work closely with Catering manager of nursing and social worker at rehab for the natural disease trajectory and expectations at EOL were  discussed.  Advanced directives, concepts specific to code status, artifical feeding and hydration, and rehospitalization were considered and discussed.  [name] is DNR.  Palliative Care services outpatient were explained and offered.  Encouraged [name] to accept these services.  She is considering  Discussed the importance of continued conversation with family and the medical providers regarding overall plan of care and treatment options, ensuring decisions are within the context of the patient's values and GOCs.  Questions and concerns were addressed.  The family was encouraged to call with questions or concerns.  PMT will continue to support holistically.  Conference with attending, bedside nursing staff, transition of care team related to patient condition, needs, goals of care, disposition.   HCPOA  LEGAL GUARDIAN -daughter, [name]    SUMMARY OF RECOMMENDATIONS   At this point continue to treat the treatable but no CPR or intubation Short-term rehab at [facility] with the ultimate goal of returning home Encouraged to consider outpatient palliative services CAP aid 45 hours/week   Code Status/Advance Care Planning: DNR -goldenrod form in ACP tab of epic chart.  Symptom Management:  Per hospitalist, no additional needs at this time.  Palliative Prophylaxis:  Frequent Pain  Assessment, Oral Care, and Palliative Wound Care  Additional Recommendations (Limitations, Scope, Preferences): Continue to treat but no CPR or intubation  Psycho-social/Spiritual:  Desire for further Chaplaincy support:no Additional Recommendations: Caregiving  Support/Resources and Education on Hospice  Prognosis:  < 6 months, would not be surprising based on chronic illness burden, decreasing functional status.  Discharge Planning: Short-term rehab with ultimate goal of returning home.      Primary Diagnoses: Present on Admission:  Fall  Anxiety disorder, unspecified  Chronic  diastolic (congestive) heart failure (HCC)  Chronic obstructive pulmonary disease, unspecified (HCC)  Dementia (HCC)  Depression  Pulmonary fibrosis, unspecified (HCC)  Compression fracture of L1,L2 vertebra (HCC)  UTI (urinary tract infection)  Closed L1 vertebral fracture (HCC)   I have reviewed the medical record, interviewed the patient and family, and examined the patient. The following aspects are pertinent.  Past Medical History:  Diagnosis Date   Anxiety    Chronic diastolic (congestive) heart failure (HCC)    Chronic obstructive pulmonary disease, unspecified (HCC)    COPD (chronic obstructive pulmonary disease) (HCC)    GERD (gastroesophageal reflux disease)    Hyperlipidemia    IBS (irritable bowel syndrome)    Lumbago with sciatica    Osteoporosis    Overactive bladder    Pneumonia    Pulmonary fibrosis (HCC)    Social History   Socioeconomic History   Marital status: Married    Spouse name: Not on file   Number of children: Not on file   Years of education: Not on file   Highest education level: Not on file  Occupational History   Not on file  Tobacco Use   Smoking status: Former    Types: Cigarettes    Passive exposure: Yes   Smokeless tobacco: Current    Types: Snuff  Vaping Use   Vaping status: Never Used  Substance and Sexual Activity   Alcohol use: No   Drug use: No   Sexual activity: Never  Other Topics Concern   Not on file  Social History Narrative   ** Merged History Encounter **       Social Determinants of Health   Financial Resource Strain: Not on file  Food Insecurity: No Food Insecurity (07/25/2022)   Hunger Vital Sign    Worried About Running Out of Food in the Last Year: Never true    Ran Out of Food in the Last Year: Never true  Transportation Needs: No Transportation Needs (07/25/2022)   PRAPARE - Administrator, Civil Service (Medical): No    Lack of Transportation (Non-Medical): No  Physical Activity: Not on  file  Stress: Not on file  Social Connections: Not on file   Family History  Problem Relation Age of Onset   Stroke Mother    Diabetes Mother    Heart disease Father    Colon cancer Neg Hx    Scheduled Meds:  ALPRAZolam  0.5 mg Oral BID   donepezil  10 mg Oral Daily   enoxaparin (LOVENOX) injection  30 mg Subcutaneous Q24H   fluticasone furoate-vilanterol  1 puff Inhalation q morning   furosemide  40 mg Oral Daily   memantine  10 mg Oral BID   pantoprazole  40 mg Oral BID   Continuous Infusions:  cefTRIAXone (ROCEPHIN)  IV 1 g (07/26/22 0956)   PRN Meds:.acetaminophen **OR** acetaminophen, ibuprofen, ipratropium-albuterol, lidocaine, ondansetron **OR** ondansetron (ZOFRAN) IV, polyethylene glycol, traMADol, zolpidem Medications Prior to Admission:  Prior to Admission medications  Medication Sig Start Date End Date Taking? Authorizing Provider  albuterol (PROAIR HFA) 108 (90 Base) MCG/ACT inhaler Inhale 2 puffs into the lungs every 4 (four) hours as needed for wheezing or shortness of breath. Patient taking differently: Inhale 2 puffs into the lungs every 6 (six) hours as needed for wheezing or shortness of breath. 06/12/21  Yes Johnson, Clanford L, MD  ALPRAZolam (XANAX) 0.5 MG tablet Take 1 tablet (0.5 mg total) by mouth 2 (two) times daily. 09/30/21  Yes Burnadette Pop, MD  ascorbic acid (VITAMIN C) 500 MG tablet Take 500 mg by mouth daily.   Yes [provider]  diclofenac Sodium (VOLTAREN) 1 % GEL APPLY AS DIRECTED TO THE AFFECTED KNEE FOUR TIMES DAILY. Patient taking differently: Apply 1 Application topically 4 (four) times daily as needed (pain). 08/04/20  Yes Vickki Hearing, MD  donepezil (ARICEPT) 10 MG tablet Take 10 mg by mouth daily. 07/25/19  Yes [provider]  fluticasone furoate-vilanterol (BREO ELLIPTA) 100-25 MCG/ACT AEPB Inhale 1 puff into the lungs every morning.   Yes [provider]  furosemide (LASIX) 40 MG tablet Take 40 mg by  mouth daily.   Yes [provider]  ipratropium-albuterol (DUONEB) 0.5-2.5 (3) MG/3ML SOLN Take 3 mLs by nebulization in the morning, at noon, in the evening, and at bedtime.   Yes [provider]  LINZESS 145 MCG CAPS capsule Take 145 mcg by mouth daily as needed (bowel movement). 05/22/21  Yes [provider]  loratadine (CLARITIN) 10 MG tablet Take 10 mg by mouth daily. 08/29/21  Yes [provider]  memantine (NAMENDA) 10 MG tablet Take 10 mg by mouth 2 (two) times daily.   Yes [provider]  Multiple Vitamins-Minerals (ONE A DAY WOMEN 50 PLUS PO) Take 1 tablet by mouth daily.   Yes [provider]  pantoprazole (PROTONIX) 40 MG tablet TAKE (1) TABLET BY MOUTH TWICE A DAY WITH MEALS (BREAKFAST AND SUPPER) 05/24/19  Yes Anice Paganini, NP  potassium chloride (K-DUR) 10 MEQ tablet Take 1 tablet (10 mEq total) by mouth daily. 06/23/18  Yes Sharee Holster, NP  simvastatin (ZOCOR) 10 MG tablet Take 10 mg by mouth daily. 02/27/21  Yes [provider]  traMADol (ULTRAM) 50 MG tablet Take 1 tablet (50 mg total) by mouth every 8 (eight) hours as needed for moderate pain. Patient taking differently: Take 100 mg by mouth See admin instructions. 100 mg every 4-6 hours as needed for moderate pain. 09/30/21  Yes Burnadette Pop, MD  zolpidem (AMBIEN) 5 MG tablet Take 5 mg by mouth at bedtime.   Yes [provider]   Allergies  Allergen Reactions   Codeine Anaphylaxis, Shortness Of Breath and Rash   Neurontin [Gabapentin] Anaphylaxis   Sulfonamide Derivatives Hives and Shortness Of Breath   Aloe Other (See Comments)    Unknown reaction   Levaquin [Levofloxacin] Other (See Comments)    Pt admitted to hospital with lung problems due to this medication.   Morphine     Patient's daughter reports hypoxia requiring O2 with morphine and similar opiates.  But does okay with tramadol.   Septra [Sulfamethoxazole-Trimethoprim] Hives   Sulfa  Antibiotics Hives   Trimethoprim Hives   Vibra-Tab [Doxycycline] Hives   Review of Systems  Unable to perform ROS: Dementia    Physical Exam Vitals and nursing note reviewed.  Constitutional:      General: She is not in acute distress.    Appearance: She is ill-appearing.  Cardiovascular:     Rate and Rhythm: Normal rate.  Pulmonary:     Effort: Pulmonary effort is normal. No respiratory distress.  Skin:    General: Skin is warm and dry.  Neurological:     Mental Status: She is alert.     Comments: Known dementia, orientation questions not asked.   Psychiatric:        Mood and Affect: Mood normal.        Behavior: Behavior normal.    Vital Signs: BP (!) 142/76 (BP Location: Left Arm)   Pulse 79   Temp 98 F (36.7 C) (Oral)   Resp 20   Wt 49.8 kg   SpO2 94%   BMI 24.61 kg/m  Pain Scale: 0-10   Pain Score: 6    SpO2: SpO2: 94 % O2 Device:SpO2: 94 % O2 Flow Rate: .O2 Flow Rate (L/min): 2 L/min  IO: Intake/output summary:  Intake/Output Summary (Last 24 hours) at 07/27/2022 0854 Last data filed at 07/27/2022 0454 Gross per 24 hour  Intake 580 ml  Output 500 ml  Net 80 ml    LBM: Last BM Date : 07/26/22 Baseline Weight: Weight: 49.8 kg Most recent weight: Weight: 49.8 kg     Palliative Assessment/Data:     Time In: 1040  Time Out: 1155 Time Total: 75 minutes  Greater than 50%  of this time was spent counseling and coordinating care related to the above assessment and plan.  Signed by: Katheran Awe, NP   Please contact Palliative Medicine Team phone at [phone] for questions and concerns.  For individual provider: See Loretha Stapler

## 2022-07-27 NOTE — Progress Notes (Signed)
Physical Therapy Treatment Patient Details Name: [name] MRN: [id number] DOB: February 21, 1934 Today's Date: 07/27/2022   History of Present Illness [name] is a 88 y.o. female with medical history significant for  CHF, COPD, pulmonary fibrosis, dementia.       Patient's was transferred from [facility] with reports of a fall, with subsequent CT lumbar spine with findings of severe compression fracture at L1 and L2.  She was also diagnosed with UTI, started on Rocephin.  Patient was admitted to hospitalist service this morning at about 7 AM, with the above diagnosis.  Neurosurgeon Dr. Christell Constant was consulted, and said patient's fractures were nonsurgical.  Patient was admitted for pain control and UTI.  She was also given IV morphine with drop in O2 sats to 90%, she was placed on 2 L.  Vitals were stable, with temperature of 97.3, blood pressure 132/68, heart rate of 85, respirate rate of 18, O2 sats of 98% on 2 L.  Hospitalist reported that daughter did not want patient admitted at [facility] and requested patient be transferred to [facility].    PT Comments  Pt tolerated treatment well today. Able to ambulate a greater distance today using a RW but activity was limited due to fatigue and LE weakness. Patient will benefit from continued skilled physical therapy in hospital and recommended venue below to increase strength, balance, endurance for safe ADLs and gait.      Assistance Recommended at Discharge Set up Supervision/Assistance  If plan is discharge home, recommend the following:  Can travel by private vehicle    A little help with walking and/or transfers;A little help with bathing/dressing/bathroom;Assistance with cooking/housework;Assist for transportation      Equipment Recommendations  Rolling walker (2 wheels)    Recommendations for Other Services       Precautions / Restrictions Precautions Precautions: Fall Restrictions Weight Bearing Restrictions:  No     Mobility  Bed Mobility Overal bed mobility: Needs Assistance Bed Mobility: Sidelying to Sit   Sidelying to sit: Min assist, Mod assist            Transfers Overall transfer level: Needs assistance Equipment used: Rolling walker (2 wheels) Transfers: Sit to/from Stand, Bed to chair/wheelchair/BSC Sit to Stand: Min assist   Step pivot transfers: Min assist, Min guard       General transfer comment: Labored movement. Extended time.    Ambulation/Gait Ambulation/Gait assistance: Modified independent (Device/Increase time) Gait Distance (Feet): 70 Feet Assistive device: Rolling walker (2 wheels) Gait Pattern/deviations: Step-through pattern, Decreased step length - right, Decreased step length - left, Decreased stride length Gait velocity: decreased     General Gait Details: RW   Stairs             Wheelchair Mobility     Tilt Bed    Modified Rankin (Stroke Patients Only)       Balance Overall balance assessment: Needs assistance Sitting-balance support: Feet supported, Bilateral upper extremity supported Sitting balance-Leahy Scale: Good Sitting balance - Comments: seated EOB   Standing balance support: Bilateral upper extremity supported, During functional activity, Reliant on assistive device for balance Standing balance-Leahy Scale: Fair Standing balance comment: using RW                            Cognition Arousal/Alertness: Awake/alert Behavior During Therapy: WFL for tasks assessed/performed Overall Cognitive Status: Within Functional Limits for tasks assessed  Exercises General Exercises - Lower Extremity Ankle Circles/Pumps: AROM, Right, Left, Seated, 10 reps Long Arc Quad: AROM, Right, Left, Seated, 10 reps Hip Flexion/Marching: AROM, Right, Left, Seated, 10 reps Toe Raises: AROM, Right, Left, Seated, 10 reps Heel Raises: AROM, Right, Left, Seated, 10  reps    General Comments        Pertinent Vitals/Pain Pain Assessment Pain Assessment: Faces Faces Pain Scale: Hurts a little bit Pain Location: back Pain Descriptors / Indicators: Discomfort, Grimacing Pain Intervention(s): Limited activity within patient's tolerance, Monitored during session    Home Living                          Prior Function            PT Goals (current goals can now be found in the care plan section) Acute Rehab PT Goals Patient Stated Goal: Return home with assist of family PT Goal Formulation: With patient Time For Goal Achievement: 08/09/22 Potential to Achieve Goals: Good Progress towards PT goals: Progressing toward goals    Frequency    Min 3X/week      PT Plan Current plan remains appropriate    Co-evaluation     PT goals addressed during session: Mobility/safety with mobility;Balance;Proper use of DME        AM-PAC PT "6 Clicks" Mobility   Outcome Measure  Help needed turning from your back to your side while in a flat bed without using bedrails?: A Little Help needed moving from lying on your back to sitting on the side of a flat bed without using bedrails?: A Little Help needed moving to and from a bed to a chair (including a wheelchair)?: A Little Help needed standing up from a chair using your arms (e.g., wheelchair or bedside chair)?: A Little Help needed to walk in hospital room?: A Little Help needed climbing 3-5 steps with a railing? : A Lot 6 Click Score: 17    End of Session Equipment Utilized During Treatment: Gait belt Activity Tolerance: Patient tolerated treatment well;Patient limited by fatigue Patient left: in chair;with call bell/phone within reach Nurse Communication: Mobility status PT Visit Diagnosis: Unsteadiness on feet (R26.81);Other abnormalities of gait and mobility (R26.89);Muscle weakness (generalized) (M62.81);History of falling (Z91.81)     Time: 1610-9604 PT Time Calculation  (min) (ACUTE ONLY): 30 min  Charges:    $Gait Training: 8-22 mins $Therapeutic Exercise: 8-22 mins PT General Charges $$ ACUTE PT VISIT: 1 Visit                     Christean Silvestri SPT [place], DPT Program

## 2022-07-27 NOTE — Assessment & Plan Note (Signed)
L1/L2 vertebral compression fracture -not amendable to surgery -Will continue supportive therapy -Sensitive to strong products, -Continue with pain management anti-inflammatories -PT/OT

## 2022-07-27 NOTE — Progress Notes (Signed)
Occupational Therapy Treatment Patient Details Name: [name] MRN: [id number] DOB: Apr 14, 1934 Today's Date: 07/27/2022   History of present illness [name] is a 88 y.o. female with medical history significant for  CHF, COPD, pulmonary fibrosis, dementia.       Patient's was transferred from [facility] with reports of a fall, with subsequent CT lumbar spine with findings of severe compression fracture at L1 and L2.  She was also diagnosed with UTI, started on Rocephin.  Patient was admitted to hospitalist service this morning at about 7 AM, with the above diagnosis.  Neurosurgeon Dr. Christell Constant was consulted, and said patient's fractures were nonsurgical.  Patient was admitted for pain control and UTI.  She was also given IV morphine with drop in O2 sats to 90%, she was placed on 2 L.  Vitals were stable, with temperature of 97.3, blood pressure 132/68, heart rate of 85, respirate rate of 18, O2 sats of 98% on 2 L.  Hospitalist reported that daughter did not want patient admitted at [facility] and requested patient be transferred to [facility].   OT comments  Pt agreeable to OT treatment. Pt demonstrated need for min A progressing to more Min G to min A for sit to stand from the recliner. Able to ambulate to the sink and wash hands with assist to reach the soap and for safety in standing. Pt completed 3 rapid reps of sit to stand from the chair using RW. Pt is progressing well. Recommended discharged change due to family reporting need for SNF due to lack of appropriate support for current function. Pt will benefit from continued OT in the hospital and recommended venue below to increase strength, balance, and endurance for safe ADL's.      Recommendations for follow up therapy are one component of a multi-disciplinary discharge planning process, led by the attending physician.  Recommendations may be updated based on patient status, additional functional criteria and insurance  authorization.    Assistance Recommended at Discharge Frequent or constant Supervision/Assistance  Patient can return home with the following  A little help with walking and/or transfers;A lot of help with bathing/dressing/bathroom;Assistance with cooking/housework;Assistance with feeding;Assist for transportation;Help with stairs or ramp for entrance   Equipment Recommendations  None recommended by OT          Precautions / Restrictions Precautions Precautions: Fall Restrictions Weight Bearing Restrictions: No       Mobility Bed Mobility               General bed mobility comments: In the chair at the start of the session.    Transfers Overall transfer level: Needs assistance Equipment used: Rolling walker (2 wheels) Transfers: Sit to/from Stand, Bed to chair/wheelchair/BSC Sit to Stand: Min assist     Step pivot transfers: Min assist, Min guard     General transfer comment: Labored movement. Extended time. Assist for first boost from the chair.     Balance Overall balance assessment: Needs assistance Sitting-balance support: Feet supported, Bilateral upper extremity supported Sitting balance-Leahy Scale: Good Sitting balance - Comments: seated in the recliner   Standing balance support: Bilateral upper extremity supported, During functional activity, Reliant on assistive device for balance Standing balance-Leahy Scale: Fair Standing balance comment: using RW                           ADL either performed or assessed with clinical judgement   ADL Overall  ADL's : Needs assistance/impaired     Grooming: Min guard;Wash/dry hands;Minimal assistance;Standing Grooming Details (indicate cue type and reason): Assist needed for safety and to reach soap while standing to wash hands.                             Functional mobility during ADLs: Min guard;Rolling walker (2 wheels) General ADL Comments: Able to ambulate to sink and back to  chair with min G assist .      Cognition Arousal/Alertness: Awake/alert Behavior During Therapy: WFL for tasks assessed/performed Overall Cognitive Status: Within Functional Limits for tasks assessed                                          Exercises Exercises: Other exercises Other Exercises Other Exercises: x3 reps of rapid sit to stand with Min G to min A. Completed from recliner using RW.                 Pertinent Vitals/ Pain       Pain Assessment Pain Assessment: Faces Faces Pain Scale: Hurts little more Pain Location: back Pain Descriptors / Indicators: Discomfort, Grimacing Pain Intervention(s): Limited activity within patient's tolerance, Monitored during session, Repositioned                                                          Frequency  Min 1X/week        Progress Toward Goals  OT Goals(current goals can now be found in the care plan section)  Progress towards OT goals: Progressing toward goals  Acute Rehab OT Goals Patient Stated Goal: return home OT Goal Formulation: With patient Time For Goal Achievement: 08/09/22 Potential to Achieve Goals: Good ADL Goals Pt Will Perform Grooming: with supervision Pt Will Perform Upper Body Dressing: with set-up Pt Will Transfer to Toilet: with modified independence;with supervision Pt Will Perform Toileting - Clothing Manipulation and hygiene: with set-up Pt/caregiver will Perform Home Exercise Program: Increased ROM;Increased strength;Both right and left upper extremity;Independently  Plan Discharge plan needs to be updated                                    End of Session Equipment Utilized During Treatment: Gait belt;Rolling walker (2 wheels)  OT Visit Diagnosis: Unsteadiness on feet (R26.81);Other abnormalities of gait and mobility (R26.89);Muscle weakness (generalized) (M62.81)   Activity Tolerance Patient tolerated treatment well    Patient Left in chair;with call bell/phone within reach             Time: 0909-0922 OT Time Calculation (min): 13 min  Charges: OT General Charges $OT Visit: 1 Visit OT Treatments $Therapeutic Exercise: 8-22 mins  Teesha Ohm OT, MOT  Danie Chandler 07/27/2022, 9:47 AM

## 2022-07-28 DIAGNOSIS — S52591A Other fractures of lower end of right radius, initial encounter for closed fracture: Secondary | ICD-10-CM

## 2022-07-28 DIAGNOSIS — Z515 Encounter for palliative care: Secondary | ICD-10-CM | POA: Diagnosis not present

## 2022-07-28 DIAGNOSIS — W19XXXD Unspecified fall, subsequent encounter: Secondary | ICD-10-CM | POA: Diagnosis not present

## 2022-07-28 DIAGNOSIS — N3000 Acute cystitis without hematuria: Secondary | ICD-10-CM | POA: Diagnosis not present

## 2022-07-28 DIAGNOSIS — S32010S Wedge compression fracture of first lumbar vertebra, sequela: Secondary | ICD-10-CM | POA: Diagnosis not present

## 2022-07-28 DIAGNOSIS — S52501A Unspecified fracture of the lower end of right radius, initial encounter for closed fracture: Secondary | ICD-10-CM

## 2022-07-28 DIAGNOSIS — Z7189 Other specified counseling: Secondary | ICD-10-CM | POA: Diagnosis not present

## 2022-07-28 DIAGNOSIS — S32010A Wedge compression fracture of first lumbar vertebra, initial encounter for closed fracture: Secondary | ICD-10-CM | POA: Diagnosis not present

## 2022-07-28 LAB — URINE CULTURE

## 2022-07-28 MED ORDER — IPRATROPIUM-ALBUTEROL 0.5-2.5 (3) MG/3ML IN SOLN: 3.0000 mL | Freq: Three times a day (TID) | RESPIRATORY_TRACT | Status: DC

## 2022-07-28 MED ORDER — IPRATROPIUM-ALBUTEROL 0.5-2.5 (3) MG/3ML IN SOLN
3.0000 mL | Freq: Three times a day (TID) | RESPIRATORY_TRACT | Status: DC
  Filled 2022-07-28: qty 3

## 2022-07-28 MED ORDER — FOSFOMYCIN TROMETHAMINE 3 G PO PACK
3.0000 g | PACK | Freq: Once | ORAL | Status: AC
  Administered 2022-07-28: 3 g via ORAL
  Filled 2022-07-28: qty 3

## 2022-07-28 NOTE — Progress Notes (Signed)
Palliative:   Mrs. Branscom is resting quietly in bed. She appears chronically ill and frail.  She is resting comfortable, but wakes easily.  She has known dementia, therefor I do not ask orientation questions.   We talk about the plan for short-term rehab.  Mrs. Blackard asks about choice of facility.  I share that she has had a bed offer from [facility] which her daughter has accepted.  Mrs. Hegg had previously requested a bed at [facility].  I shared that unfortunately, [facility] did not make an offer.  Mrs. Hoefer asks about being transferred to the facility via ambulance.  She also asks how long she would be expected to stay.  I share that she may only need a few weeks.  She states agreement, "until my back is better".  Reassurance is offered.  She denies need for food or drink at this time.  Conference with attending, bedside nursing staff, and transition of care team related to the above assessment and plan.   Plan: At this point continue to treat the treatable but no CPR or intubation.  Short-term rehab at [facility] with ultimate goal of returning home.  Encouraged to take outpatient palliative services. DNR/goldenrod form completed and placed on chart.   35 minutes  Lillia Carmel, NP Palliative Medicine Team Team Phone [phone] Greater than 50% of this time was spent counseling and coordinating care related to the above assessment and plan.

## 2022-07-28 NOTE — Plan of Care (Signed)

## 2022-07-28 NOTE — Progress Notes (Signed)
Orthopedic Tech Progress Note Patient Details:  [name] 1934-10-07 [id number]  Called in order to [facility] earlier for a TLSO BRACE and a WRIST BRACE   Patient ID: [name], female   DOB: 1934/08/07, 88 y.o.   MRN: [id number]  [name] 07/28/2022, 5:28 PM

## 2022-07-28 NOTE — Progress Notes (Signed)
PROGRESS NOTE  [name]:[id number] DOB: 02/21/1934 DOA: 07/25/2022 PCP: Marylynn Pearson, FNP  Brief History:  [name] is a 88 y.o. female with medical history significant for  CHF, COPD, pulmonary fibrosis, dementia.     Patient's was transferred from [facility] with reports of a fall, with subsequent CT lumbar spine with findings of severe compression fracture at L1 and L2.  She was also diagnosed with UTI, started on Rocephin.  Patient was admitted to hospitalist service this morning at about 7 AM, with the above diagnosis.  Neurosurgeon Dr. Christell Constant was consulted, and said patient's fractures were nonsurgical.  Patient was admitted for pain control and UTI.  She was also given IV morphine with drop in O2 sats to 90%, she was placed on 2 L. Vitals were stable, with temperature of 97.3, blood pressure 132/68, heart rate of 85, respirate rate of 18, O2 sats of 98% on 2 L. Hospitalist reported that daughter did not want patient admitted at [facility] and requested patient be transferred to [facility].   Patient lives at home with multiple family members, ambulates with a walker at baseline.  Daughter- [name] at bedside.  She reports that patient has been having urinary frequency without dysuria over the past several days.  She has also been complaining of pain to her right wrist since fall.- 7/19.  Fall was unwitnessed, patient did not hit her head.  She was unable to get up after the fall.  At the time of my evaluation, patient is awake alert oriented to person, and able to answer simple questions.  She does not know why she fell.  She denies dizziness.  No chest pain no difficulty breathing.  She denies pain with urination.     Assessment/Plan:  Fall -Fall with subsequent compression fracture-L1-L2,.  -CT did not show any spondylolithesis;  showed normal alignment of vertebrae  - Per HPI: Signout from [facility] hospitalist Dr. Vena Austria.  -Patient appears  comfortable, family reports intolerance(hypoxia) to morphine, Dilaudid or similar opiates -Tylenol, lidocaine patch, as needed tramadol -Request records from [facility] - PT/OT/ TOC  >>> candidate for SNF   Compression fracture of L1,L2 vertebra (HCC) L1/L2 vertebral compression fracture -not amendable to surgery -continue supportive therapy>>TLSO brace and tramadol -Sensitive to "strong"--daughter only wants pt to have tramadol -Continue with pain management anti-inflammatories -PT/OT>>SNF -no red flags on exam -Follow-up as outpatient   UTI (urinary tract infection) -Also diagnosed with UTI, obtain UA, [facility],  - continued IV Rocephin started at [facility] initially - urine culture = ESBL E coli -give fosfomycin x 1 -d/c ceftriaxone   Closed L1 vertebral fracture (HCC) L1/L2 vertebral compression fracture -not amendable to surgery -Will continue supportive therapy -Sensitive to strong products, -Continue with pain management anti-inflammatories -TLSO brace ordered  Right distal radius fracture -appreciate ortho, Dr. Romeo Apple -place brace   Chronic diastolic (congestive) heart failure (HCC) -Stable and compensated.   Last echo 04/2022 EF 55 to 60%, grade 1 DD. -Resume home Lasix     Anxiety disorder, unspecified Monitoring closely as needed Haldol Xanax  -PDMP reviewed--last refill 07/07/22 -also on zolpidem 5 mg q hs--last refill 07/07/22   Chronic obstructive pulmonary disease, unspecified (HCC) Stable. Satting 100% on 2 L of oxygen>>now stable on RA -Resume home regimen   Dementia (HCC) Stable.   Lives at home with several family members. -Resume donepezil, memantine -consulting TOC>>  SNF placement       Family Communication:   daughter updated 7/24  Consultants:  ortho--[place]  Code Status:  DNR  DVT Prophylaxis:   [place] Lovenox   Procedures: As Listed in Progress Note  Above  Antibiotics: None        Subjective: She complains of back pain, worse with movement.  Denies any new leg weakness.  Denies bowel or bladder incontinence.  Denies f/c, cp, sob, nv/d  Objective: Vitals:   07/28/22 0521 07/28/22 0739 07/28/22 0920 07/28/22 1350  BP: 98/69  98/63 95/61  Pulse: 77  73 74  Resp: 18  19 18   Temp: 98.2 F (36.8 C)  98.1 F (36.7 C) 97.9 F (36.6 C)  TempSrc: Oral  Oral Oral  SpO2: 94% 94% 93% 94%  Weight:        Intake/Output Summary (Last 24 hours) at 07/28/2022 1802 Last data filed at 07/28/2022 1436 Gross per 24 hour  Intake 480 ml  Output --  Net 480 ml   Weight change:  Exam:  General:  Pt is alert, follows commands appropriately, not in acute distress HEENT: No icterus, No thrush, No neck mass, [place]/AT Cardiovascular: RRR, S1/S2, no rubs, no gallops Respiratory: bibasilar rales.  No wheeze Abdomen: Soft/+BS, non tender, non distended, no guarding Extremities: No edema, No lymphangitis, No petechiae, No rashes, no synovitis   Data Reviewed: I have personally reviewed following labs and imaging studies Basic Metabolic Panel: Recent Labs  Lab 07/25/22 1958  NA 135  K 4.4  CL 103  CO2 25  GLUCOSE 162*  BUN 20  CREATININE 1.00  CALCIUM 8.2*   Liver Function Tests: No results for input(s): "AST", "ALT", "ALKPHOS", "BILITOT", "PROT", "ALBUMIN" in the last 168 hours. No results for input(s): "LIPASE", "AMYLASE" in the last 168 hours. No results for input(s): "AMMONIA" in the last 168 hours. Coagulation Profile: No results for input(s): "INR", "PROTIME" in the last 168 hours. CBC: Recent Labs  Lab 07/25/22 1958  WBC 8.1  HGB 9.7*  HCT 30.6*  MCV 92.2  PLT 228   Cardiac Enzymes: No results for input(s): "CKTOTAL", "CKMB", "CKMBINDEX", "TROPONINI" in the last 168 hours. BNP: Invalid input(s): "POCBNP" CBG: No results for input(s): "GLUCAP" in the last 168 hours. HbA1C: No results for input(s): "HGBA1C" in  the last 72 hours. Urine analysis:    Component Value Date/Time   COLORURINE YELLOW 07/26/2022 0932   APPEARANCEUR HAZY (A) 07/26/2022 0932   LABSPEC 1.016 07/26/2022 0932   PHURINE 5.0 07/26/2022 0932   GLUCOSEU NEGATIVE 07/26/2022 0932   HGBUR NEGATIVE 07/26/2022 0932   BILIRUBINUR NEGATIVE 07/26/2022 0932   KETONESUR 5 (A) 07/26/2022 0932   PROTEINUR NEGATIVE 07/26/2022 0932   UROBILINOGEN 0.2 03/01/2012 2011   NITRITE NEGATIVE 07/26/2022 0932   LEUKOCYTESUR MODERATE (A) 07/26/2022 0932   Sepsis Labs: @LABRCNTIP (procalcitonin:4,lacticidven:4) ) Recent Results (from the past 240 hour(s))  Urine Culture     Status: Abnormal   Collection Time: 07/26/22  9:32 AM   Specimen: Urine, Random  Result Value Ref Range Status   Specimen Description   Final    URINE, RANDOM Performed at [facility], [address] [place], [place] [address]    Special Requests   Final    NONE Reflexed from [id number] Performed at [facility], [address], [place], [place] [address]    Culture (A)  Final    >=100,000 COLONIES/mL ESCHERICHIA COLI Confirmed Extended Spectrum Beta-Lactamase Producer (ESBL).  In bloodstream infections from ESBL organisms,  carbapenems are preferred over piperacillin/tazobactam. They are shown to have a lower risk of mortality.    Report Status 07/28/2022 FINAL  Final   Organism ID, Bacteria ESCHERICHIA COLI (A)  Final      Susceptibility   Escherichia coli - MIC*    AMPICILLIN >=32 RESISTANT Resistant     CEFAZOLIN >=64 RESISTANT Resistant     CEFEPIME 16 RESISTANT Resistant     CEFTRIAXONE >=64 RESISTANT Resistant     CIPROFLOXACIN >=4 RESISTANT Resistant     GENTAMICIN >=16 RESISTANT Resistant     IMIPENEM <=0.25 SENSITIVE Sensitive     NITROFURANTOIN <=16 SENSITIVE Sensitive     TRIMETH/SULFA <=20 SENSITIVE Sensitive     AMPICILLIN/SULBACTAM >=32 RESISTANT Resistant     PIP/TAZO <=4 SENSITIVE Sensitive     * >=100,000 COLONIES/mL ESCHERICHIA COLI      Scheduled Meds:  ALPRAZolam  0.5 mg Oral BID   diclofenac Sodium  2 g Topical QID   donepezil  10 mg Oral Daily   enoxaparin (LOVENOX) injection  30 mg Subcutaneous Q24H   fluticasone furoate-vilanterol  1 puff Inhalation q morning   furosemide  40 mg Oral Daily   memantine  10 mg Oral BID   pantoprazole  40 mg Oral BID   Continuous Infusions:  Procedures/Studies: DG Wrist Complete Right  Result Date: 07/25/2022 CLINICAL DATA:  Recent fall with wrist pain, initial encounter EXAM: RIGHT WRIST - COMPLETE 3+ VIEW COMPARISON:  None Available. FINDINGS: Degenerative changes are noted in the interphalangeal joints. Old healed fracture in the proximal first metacarpal is noted. Carpal rows are distorted likely related to prior traumatic injury and ligamentous damage. Irregularity of the articular surface of the radius is noted particularly on the lateral film consistent with a minimally displaced fracture of the distal radius. No other focal abnormality is noted. IMPRESSION: Chronic degenerative changes. Irregularity of the articular surface of the distal radius consistent with mildly displaced fracture. Electronically Signed   By: Alcide Clever M.D.   On: 07/25/2022 22:26    Catarina Hartshorn, DO  [facility]  If 7PM-7AM, please contact night-coverage [email or web address] Password [email or web address] 07/28/2022, 6:02 PM   LOS: 3 days

## 2022-07-28 NOTE — Consult Note (Signed)
ORTHOPAEDIC CONSULTATION  REQUESTING PHYSICIAN: Tat, Onalee Hua, MD  ASSESSMENT AND PLAN: 88 y.o. female with the following: Nondisplaced right distal radius fracture and L1-2 compression fractures diagnosed by CT scan   HPI [name] is a 88 y.o. female.   Recent fall.  Presented for evaluation and management of UTI  Workup included x-rays right wrist which show a hairline fracture on the dorsum of the distal radius.  She also had CT scan which showed L1-2 compression fractures appear to be new.  She has having pain in the area of the L1-L2 area.  The wrist is only tender and painful when palpated   Review of Systems Review of Systems  Constitutional:  Positive for activity change and fatigue.  Genitourinary:  Positive for difficulty urinating and dysuria.  Musculoskeletal:  Positive for back pain, gait problem and myalgias.  Neurological:  Negative for numbness.     Past Medical History:  Diagnosis Date   Anxiety    Chronic diastolic (congestive) heart failure (HCC)    Chronic obstructive pulmonary disease, unspecified (HCC)    COPD (chronic obstructive pulmonary disease) (HCC)    GERD (gastroesophageal reflux disease)    Hyperlipidemia    IBS (irritable bowel syndrome)    Lumbago with sciatica    Osteoporosis    Overactive bladder    Pneumonia    Pulmonary fibrosis (HCC)     Past Surgical History:  Procedure Laterality Date   COLONOSCOPY  10/04/2009   sigmoid diverticula, multiple tubulovillous adneomas, needs surveillance Oct 2013   ESOPHAGOGASTRODUODENOSCOPY  04/16/2011   Dr. Jena Gauss: Noncritical Schatzkis ring( not manipulated because no dysphagia). Normal esophagus otherwise  large hiatal hernia. Gastric polyp-status post biopsy. Gastric erosions-staus post biopsy. Abnormal bulb-status post biopsy. benign small bowel, stomach biopsy with ulcerated gastric antral mucosa with foveolar hyperplasia and surface erosion, polyp with inflamed gastric antral mucosa     ESOPHAGOGASTRODUODENOSCOPY N/A 04/29/2014   Dr. Jena Gauss: Noncritical Schatzki's ring large hiatal hernia. Retained gastric contents.GES with slight delay   femur fracture Left 2023   HIP ARTHROPLASTY Left 09/24/2021   Procedure: ARTHROPLASTY BIPOLAR HIP (HEMIARTHROPLASTY);  Surgeon: Oliver Barre, MD;  Location: [facility];  Service: Orthopedics;  Laterality: Left;   HIP PINNING,CANNULATED Right 04/06/2018   Procedure: CANNULATED HIP PINNING;  Surgeon: Vickki Hearing, MD;  Location: [facility];  Service: Orthopedics;  Laterality: Right;      Physical Exam 1 BP 98/63 (BP Location: Right Arm)   Pulse 73   Temp 98.1 F (36.7 C) (Oral)   Resp 19   Wt 49.8 kg   SpO2 93%   BMI 24.61 kg/m   2 The patient has no gross deformities except for the kyphosis of the thoracolumbar spine 3 Orientation to person place and time is normal  4 Mood is pleasant.  5 Ambulatory statusambulatory currently in bed  6 Inspection of the right wrist reveals mild tenderness   no swelling no deformity 7 Range of motion assessment: The range of motion is diminished primarily secondary to pain 8 Stability tests are deferred because of pain but the x-ray shows no subluxation of the joint 9 Strength assessment muscle tone is normal resistance testing is deferred because of pain and swelling  10 Nerve function normal 11 Vascular function palpable pulses 12 Local lymphatic system negative  Opposite extremity normal there is no alignment abnormality, no contracture, no subluxation, no atrophy and neurovascular exam is intact  Lower back nontender tender over the  thoracolumbar junction  Data  reviewed:  Plain films of the wrist show a nondisplaced distal radius fracture at the dorsal margin.  Severe arthritis of the wrist  Report of the CT scan is an L1-L2 fracture    IMAGING: Independent interpretation of images: X-ray shows arthritis of the wrist and a minimally displaced hairline fracture best seen on the  lateral x-ray this involves the right wrist  Orders: Wrist brace  Outside records reviewed: None  C. MANAGEMENT nonoperative care of the right wrist follow-up in 6 weeks for x-ray  The patient has a thoracolumbar orthosis ordered by the medical staff to be worn for approximately 6 weeks or until pain is well-controlled  Meds ordered this encounter  Medications   OR Linked Order Group    acetaminophen (TYLENOL) tablet 650 mg    acetaminophen (TYLENOL) suppository 650 mg   OR Linked Order Group    ondansetron (ZOFRAN) tablet 4 mg    ondansetron (ZOFRAN) injection 4 mg   polyethylene glycol (MIRALAX / GLYCOLAX) packet 17 g   lidocaine (LIDODERM) 5 % 1 patch   traMADol (ULTRAM) tablet 50 mg   DISCONTD: cefTRIAXone (ROCEPHIN) 1 g in sodium chloride 0.9 % 100 mL IVPB    Order Specific Question:   Antibiotic Indication:    Answer:   UTI   cefTRIAXone (ROCEPHIN) 1 g in sodium chloride 0.9 % 100 mL IVPB    Order Specific Question:   Antibiotic Indication:    Answer:   UTI   donepezil (ARICEPT) tablet 10 mg   fluticasone furoate-vilanterol (BREO ELLIPTA) 100-25 MCG/ACT 1 puff   furosemide (LASIX) tablet 40 mg   ipratropium-albuterol (DUONEB) 0.5-2.5 (3) MG/3ML nebulizer solution 3 mL   ALPRAZolam (XANAX) tablet 0.5 mg   memantine (NAMENDA) tablet 10 mg   pantoprazole (PROTONIX) EC tablet 40 mg   enoxaparin (LOVENOX) injection 30 mg   zolpidem (AMBIEN) tablet 5 mg   ibuprofen (ADVIL) tablet 600 mg   diclofenac Sodium (VOLTAREN) 1 % topical gel 2 g

## 2022-07-28 NOTE — Progress Notes (Signed)
I reached out to the Ortho tech at [facility], about the new order for patient TLSO brace to right wrist . I was informed that they have received the order ,Ortho tech asked what room was patient in and what location, information provided. Plan of care on going.

## 2022-07-28 NOTE — TOC Progression Note (Signed)
Transition of Care [facility]) - Progression Note    Patient Details  Name: DEVYNNE STURDIVANT MRN: [id number] Date of Birth: March 28, 1934  Transition of Care [facility]) CM/SW Contact  Leitha Bleak, RN Phone Number: 07/28/2022, 11:04 AM  Clinical Narrative:   workup for right wrist. [facility] updated. Possibly discharging to SNF tomorrow.    Expected Discharge Plan: Skilled Nursing Facility Barriers to Discharge: Continued Medical Work up  Expected Discharge Plan and Services      [facility] Agency: [facility] Date [facility] Agency Contacted: 07/26/22 Time HH Agency Contacted: 1229 Representative spoke with at [facility] Agency: Morrie Sheldon   Social Determinants of Health (SDOH) Interventions SDOH Screenings   Food Insecurity: No Food Insecurity (07/25/2022)  Housing: Low Risk  (07/25/2022)  Transportation Needs: No Transportation Needs (07/25/2022)  Utilities: Not At Risk (07/25/2022)  Tobacco Use: High Risk (07/27/2022)    Readmission Risk Interventions    07/26/2022   12:28 PM 09/30/2021    9:22 AM 06/12/2021   12:11 PM  Readmission Risk Prevention Plan  Transportation Screening Complete Complete Complete  PCP or Specialist Appt within 5-7 Days Not Complete    PCP or Specialist Appt within 3-5 Days   Complete  Home Care Screening Complete    Medication Review (RN CM) Complete    HRI or Home Care Consult   Complete  Social Work Consult for Recovery Care Planning/Counseling   Complete  Palliative Care Screening   Not Applicable  Medication Review Oceanographer)  Complete Complete  HRI or Home Care Consult  Complete   SW Recovery Care/Counseling Consult  Complete   Palliative Care Screening  Not Applicable   Skilled Nursing Facility  Complete

## 2022-07-29 DIAGNOSIS — S32010K Wedge compression fracture of first lumbar vertebra, subsequent encounter for fracture with nonunion: Secondary | ICD-10-CM

## 2022-07-29 DIAGNOSIS — W19XXXD Unspecified fall, subsequent encounter: Secondary | ICD-10-CM | POA: Diagnosis not present

## 2022-07-29 DIAGNOSIS — S32010A Wedge compression fracture of first lumbar vertebra, initial encounter for closed fracture: Secondary | ICD-10-CM | POA: Diagnosis not present

## 2022-07-29 DIAGNOSIS — I5032 Chronic diastolic (congestive) heart failure: Secondary | ICD-10-CM | POA: Diagnosis not present

## 2022-07-29 DIAGNOSIS — Z7189 Other specified counseling: Secondary | ICD-10-CM | POA: Diagnosis not present

## 2022-07-29 DIAGNOSIS — N3000 Acute cystitis without hematuria: Secondary | ICD-10-CM | POA: Diagnosis not present

## 2022-07-29 DIAGNOSIS — Z515 Encounter for palliative care: Secondary | ICD-10-CM | POA: Diagnosis not present

## 2022-07-29 MED ORDER — ALPRAZOLAM 0.5 MG PO TABS: 0.5000 mg | ORAL_TABLET | Freq: Two times a day (BID) | ORAL | 0 refills | Status: AC

## 2022-07-29 MED ORDER — FOSFOMYCIN TROMETHAMINE 3 G PO PACK: 3.0000 g | PACK | Freq: Once | ORAL | Status: DC

## 2022-07-29 MED ORDER — ZOLPIDEM TARTRATE 5 MG PO TABS: 5.0000 mg | ORAL_TABLET | Freq: Every day | ORAL | 0 refills | Status: DC

## 2022-07-29 MED ORDER — LIDOCAINE 5 % EX PTCH: 1.0000 | MEDICATED_PATCH | Freq: Every day | CUTANEOUS | Status: DC | PRN

## 2022-07-29 MED ORDER — TRAMADOL HCL 50 MG PO TABS: 50.0000 mg | ORAL_TABLET | Freq: Four times a day (QID) | ORAL | 0 refills | Status: DC | PRN

## 2022-07-29 MED ORDER — FOSFOMYCIN TROMETHAMINE 3 G PO PACK: 3.0000 g | PACK | Freq: Once | ORAL | Status: AC

## 2022-07-29 NOTE — Plan of Care (Signed)

## 2022-07-29 NOTE — Progress Notes (Signed)
Report called to [facility], spoke with nurse Genella Rife, no further questions at this time. Pt will be transported via EMS once TLSO and wrist brace are delivered.

## 2022-07-29 NOTE — Care Management Important Message (Signed)
Important Message  Patient Details  Name: ANICKA STUCKERT MRN: [id number] Date of Birth: [birth year]   Medicare Important Message Given:  N/A - LOS <3 / Initial given by admissions     Corey Harold 07/29/2022, 1:02 PM

## 2022-07-29 NOTE — Progress Notes (Signed)
Patient discharged by EMS. IV removed. Patient belongings including back brace and wrist brace sent with EMS. Report called to facility by day shift nurse.

## 2022-07-29 NOTE — Progress Notes (Signed)
Palliative:   Mrs. Mccarron is lying quietly in bed.  She appears chronically ill and frail.  She is resting comfortably, but wakes easily when I enter the room and call her name.  She greets me, making and mostly keeping eye contact.  Although she has known dementia, she is clearly able to make her needs known and seems quite knowledgeable about her acute health concerns..  There is no family at bedside at this time.  Mrs. Vinsant states that her urinary symptoms have improved.  She states that she does have back pain.  It is noted from orthopedics consult 7/24 that the patient has thoracolumbar orthosis ordered to be worn for approximately 6 weeks or until pain is well-controlled.  Bedside nursing staff spoke with orthopedic services per notes 7/24 and TLSO brace and wrist brace are on order.   She is also to have a R wrist splint/brace and follow-up for x-rays in 6 weeks.  She has agreed to short-term rehab as she and her daughter have accepted a bed at [facility].  Conference with attending, pharmacist, bedside nursing staff, transition of care team related to patient condition, needs, goals of care, disposition.  Plan: Continue to treat the treatable but no CPR or intubation.  Short-term rehab at [facility] with ultimate goal of returning home with 45 hour per week CAP aid.  Follow-up with orthopedist in approximately 6 weeks for wrist x-ray. DNR/goldenrod form on chart.  50 minutes  Lillia Carmel, NP  Palliative medicine team Team phone [phone] Greater than 50% of this time was spent counseling and coordinating care related to the above assessment and plan.

## 2022-07-29 NOTE — Care Management Important Message (Signed)
Important Message  Patient Details  Name: [name] MRN: [id number] Date of Birth: March 01, 1934   Medicare Important Message Given:  Yes (spoke with guardian Renne Musca at [phone], no additional copy needed)     Corey Harold 07/29/2022, 3:01 PM

## 2022-07-29 NOTE — Plan of Care (Signed)

## 2022-07-29 NOTE — Discharge Summary (Addendum)
Physician Discharge Summary   Patient: [name] MRN: [id number] DOB: 13-Mar-1934  Admit date:     07/25/2022  Discharge date: 07/29/22  Discharge Physician: Onalee Hua Prerna Harold   PCP: Marylynn Pearson, FNP   Recommendations at discharge:   Please follow up with primary care provider within 1-2 weeks  Please repeat BMP and CBC in one week   Hospital Course: [name] is a 88 y.o. female with medical history significant for  CHF, COPD, pulmonary fibrosis, dementia.     Patient's was transferred from [facility] with reports of a fall, with subsequent CT lumbar spine with findings of severe compression fracture at L1 and L2.  She was also diagnosed with UTI, started on Rocephin.  Patient was admitted to hospitalist service this morning at about 7 AM, with the above diagnosis.  Neurosurgeon Dr. Christell Constant was consulted, and said patient's fractures were nonsurgical.  Patient was admitted for pain control and UTI.  She was also given IV morphine with drop in O2 sats to 90%, she was placed on 2 L. Vitals were stable, with temperature of 97.3, blood pressure 132/68, heart rate of 85, respirate rate of 18, O2 sats of 98% on 2 L. Hospitalist reported that daughter did not want patient admitted at [facility] and requested patient be transferred to [facility].   Patient lives at home with multiple family members, ambulates with a walker at baseline.  Daughter- [name] at bedside.  She reports that patient has been having urinary frequency without dysuria over the past several days.  She has also been complaining of pain to her right wrist since fall.- 7/19.  Fall was unwitnessed, patient did not hit her head.  She was unable to get up after the fall.  At the time of my evaluation, patient is awake alert oriented to person, and able to answer simple questions.  She does not know why she fell.  She denies dizziness.  No chest pain no difficulty breathing.  She denies pain with urination.     Assessment and Plan:   Fall -Fall with subsequent compression fracture-L1-L2,.  -CT did not show any spondylolithesis;  showed normal alignment of vertebrae  - Per HPI: Signout from [facility] hospitalist Dr. Vena Austria.  -Patient appears comfortable, family reports intolerance(hypoxia) to morphine, Dilaudid or similar opiates -Tylenol, lidocaine patch, as needed tramadol - PT/OT/ TOC  >>> candidate for SNF   Compression fracture of L1,L2 vertebra (HCC) L1/L2 vertebral compression fracture -not amendable to surgery -continue supportive therapy>>TLSO brace and tramadol -Sensitive to "strong"--daughter only wants pt to have tramadol -Continue with pain management anti-inflammatories -PT/OT>>SNF -no red flags on exam -overall pain is slowly improving   UTI (urinary tract infection) -Also diagnosed with UTI, obtain UA, [facility],  - continued IV Rocephin started at [facility] initially - urine culture = ESBL E coli -given fosfomycin x 1 on 07/28/22, repeat x 1 on 7/27 -d/c ceftriaxone   Closed L1 vertebral fracture (HCC) L1/L2 vertebral compression fracture -not amendable to surgery -continue supportive therapy -Sensitive to "strong" opioids -Continue with pain management anti-inflammatories -TLSO brace ordered   Right distal radius fracture -appreciate ortho, Dr. Romeo Apple -place brace -outpt follow up with repeat xrays   Chronic diastolic (congestive) heart failure (HCC) -Stable and compensated.   Last echo 04/2022 EF 55 to 60%, grade 1 DD. -Resume home Lasix     Anxiety disorder, unspecified Monitoring closely as needed Haldol Xanax  -PDMP reviewed--last refill 07/07/22 -  also on zolpidem 5 mg q hs--last refill 07/07/22   Chronic obstructive pulmonary disease, unspecified (HCC) Stable. Satting 100% on 2 L of oxygen>>now stable on RA -Resume home regimen   Dementia (HCC) Stable.   Lives at home with several family members. -Resume donepezil,  memantine -consulting TOC>> SNF placement            Consultants: ortho Procedures performed: none  Disposition: Skilled nursing facility Diet recommendation:  Regular diet DISCHARGE MEDICATION: Allergies as of 07/29/2022       Reactions   Codeine Anaphylaxis, Shortness Of Breath, Rash   Neurontin [gabapentin] Anaphylaxis   Sulfonamide Derivatives Hives, Shortness Of Breath   Aloe Other (See Comments)   Unknown reaction   Levaquin [levofloxacin] Other (See Comments)   Pt admitted to hospital with lung problems due to this medication.   Morphine    Patient's daughter reports hypoxia requiring O2 with morphine and similar opiates.  But does okay with tramadol.   Septra [sulfamethoxazole-trimethoprim] Hives   Sulfa Antibiotics Hives   Trimethoprim Hives   Vibra-tab [doxycycline] Hives        Medication List     STOP taking these medications    potassium chloride 10 MEQ tablet Commonly known as: KLOR-CON       TAKE these medications    albuterol 108 (90 Base) MCG/ACT inhaler Commonly known as: ProAir HFA Inhale 2 puffs into the lungs every 4 (four) hours as needed for wheezing or shortness of breath. What changed: when to take this   ALPRAZolam 0.5 MG tablet Commonly known as: XANAX Take 1 tablet (0.5 mg total) by mouth 2 (two) times daily.   ascorbic acid 500 MG tablet Commonly known as: VITAMIN C Take 500 mg by mouth daily.   Breo Ellipta 100-25 MCG/ACT Aepb Generic drug: fluticasone furoate-vilanterol Inhale 1 puff into the lungs every morning.   diclofenac Sodium 1 % Gel Commonly known as: VOLTAREN APPLY AS DIRECTED TO THE AFFECTED KNEE FOUR TIMES DAILY. What changed: See the new instructions.   donepezil 10 MG tablet Commonly known as: ARICEPT Take 10 mg by mouth daily.   fosfomycin 3 g Pack Commonly known as: MONUROL Take 3 g by mouth once for 1 dose. Give one time dose on 07/31/22 Start taking on: July 31, 2022   furosemide 40 MG  tablet Commonly known as: LASIX Take 40 mg by mouth daily.   ipratropium-albuterol 0.5-2.5 (3) MG/3ML Soln Commonly known as: DUONEB Take 3 mLs by nebulization in the morning, at noon, in the evening, and at bedtime.   lidocaine 5 % Commonly known as: LIDODERM Place 1 patch onto the skin daily as needed. Remove & Discard patch within 12 hours or as directed by MD   Linzess 145 MCG Caps capsule Generic drug: linaclotide Take 145 mcg by mouth daily as needed (bowel movement).   loratadine 10 MG tablet Commonly known as: CLARITIN Take 10 mg by mouth daily.   memantine 10 MG tablet Commonly known as: NAMENDA Take 10 mg by mouth 2 (two) times daily.   ONE A DAY WOMEN 50 PLUS PO Take 1 tablet by mouth daily.   pantoprazole 40 MG tablet Commonly known as: PROTONIX TAKE (1) TABLET BY MOUTH TWICE A DAY WITH MEALS (BREAKFAST AND SUPPER)   simvastatin 10 MG tablet Commonly known as: ZOCOR Take 10 mg by mouth daily.   traMADol 50 MG tablet Commonly known as: ULTRAM Take 1 tablet (50 mg total) by mouth every 6 (six) hours as  needed for moderate pain or severe pain. What changed:  when to take this reasons to take this   zolpidem 5 MG tablet Commonly known as: AMBIEN Take 1 tablet (5 mg total) by mouth at bedtime.        Contact information for after-discharge care     Destination     [facility] Preferred SNF .   Service: Skilled Nursing Contact information: [address] [place] [address] [phone]                    Discharge Exam: Ceasar Mons Weights   07/25/22 1843  Weight: 49.8 kg   HEENT:  [place]/AT, No thrush, no icterus CV:  RRR, no rub, no S3, no S4 Lung:  bibasilar rales.  No wheeze Abd:  soft/+BS, NT Ext:  No edema, no lymphangitis, no synovitis, no rash Neuro:  CN II-XII intact, strength 4/5 in RUE, 4-/5RLE, strength 4/5 LUE, LLE; sensation intact bilateral; no dysmetria; babinski  equivocal    Condition at discharge: stable  The results of significant diagnostics from this hospitalization (including imaging, microbiology, ancillary and laboratory) are listed below for reference.   Imaging Studies: DG Wrist Complete Right  Result Date: 07/25/2022 CLINICAL DATA:  Recent fall with wrist pain, initial encounter EXAM: RIGHT WRIST - COMPLETE 3+ VIEW COMPARISON:  None Available. FINDINGS: Degenerative changes are noted in the interphalangeal joints. Old healed fracture in the proximal first metacarpal is noted. Carpal rows are distorted likely related to prior traumatic injury and ligamentous damage. Irregularity of the articular surface of the radius is noted particularly on the lateral film consistent with a minimally displaced fracture of the distal radius. No other focal abnormality is noted. IMPRESSION: Chronic degenerative changes. Irregularity of the articular surface of the distal radius consistent with mildly displaced fracture. Electronically Signed   By: Alcide Clever M.D.   On: 07/25/2022 22:26    Microbiology: Results for orders placed or performed during the hospital encounter of 07/25/22  Urine Culture     Status: Abnormal   Collection Time: 07/26/22  9:32 AM   Specimen: Urine, Random  Result Value Ref Range Status   Specimen Description   Final    URINE, RANDOM Performed at [facility], [address] [place], [place] [address]    Special Requests   Final    NONE Reflexed from [id number] Performed at [facility], [address] [place], [place] [address]    Culture (A)  Final    >=100,000 COLONIES/mL ESCHERICHIA COLI Confirmed Extended Spectrum Beta-Lactamase Producer (ESBL).  In bloodstream infections from ESBL organisms, carbapenems are preferred over piperacillin/tazobactam. They are shown to have a lower risk of mortality.    Report Status 07/28/2022 FINAL  Final   Organism ID, Bacteria ESCHERICHIA COLI (A)  Final      Susceptibility   Escherichia  coli - MIC*    AMPICILLIN >=32 RESISTANT Resistant     CEFAZOLIN >=64 RESISTANT Resistant     CEFEPIME 16 RESISTANT Resistant     CEFTRIAXONE >=64 RESISTANT Resistant     CIPROFLOXACIN >=4 RESISTANT Resistant     GENTAMICIN >=16 RESISTANT Resistant     IMIPENEM <=0.25 SENSITIVE Sensitive     NITROFURANTOIN <=16 SENSITIVE Sensitive     TRIMETH/SULFA <=20 SENSITIVE Sensitive     AMPICILLIN/SULBACTAM >=32 RESISTANT Resistant     PIP/TAZO <=4 SENSITIVE Sensitive     * >=100,000 COLONIES/mL ESCHERICHIA COLI    Labs: CBC: Recent  Labs  Lab 07/25/22 1958  WBC 8.1  HGB 9.7*  HCT 30.6*  MCV 92.2  PLT 228   Basic Metabolic Panel: Recent Labs  Lab 07/25/22 1958  NA 135  K 4.4  CL 103  CO2 25  GLUCOSE 162*  BUN 20  CREATININE 1.00  CALCIUM 8.2*   Liver Function Tests: No results for input(s): "AST", "ALT", "ALKPHOS", "BILITOT", "PROT", "ALBUMIN" in the last 168 hours. CBG: No results for input(s): "GLUCAP" in the last 168 hours.  Discharge time spent: greater than 30 minutes.  Signed: Catarina Hartshorn, MD [facility] 07/29/2022

## 2022-07-29 NOTE — TOC Transition Note (Signed)
Transition of Care [facility]) - CM/SW Discharge Note   Patient Details  Name: [name] MRN: [id number] Date of Birth: 05-31-1934  [facility] CM/SW Contact:  Leitha Bleak, RN Phone Number: 07/29/2022, 12:17 PM   Clinical Narrative:   Patient is ready for discharge. Debbie at [facility] provided a room. RN calling report. TOC calling [facility], per note TLSO was ordered yesterday.  [facility] was not aware of the order. They took the information and will bring TLSO and Wrist brace today. They are aware patient is waiting to DC to [facility]. RN updated. Daughter, [name] just called back, she is here now in the room and will wait with patient.    Final next level of care: Skilled Nursing Facility Barriers to Discharge: Equipment Delay (TLSO)   Patient Goals and CMS Choice CMS Medicare.gov Compare Post Acute Care list provided to:: Patient Choice offered to / list presented to : Patient, Adult Children  Discharge Placement                    Name of family member notified: left Daughter a message Patient and family notified of of transfer: 07/29/22  Discharge Plan and Services Additional resources added to the After Visit Summary for                      Date DME Agency Contacted: 07/29/22 Time DME Agency Contacted: 1216 Representative spoke with at DME Agency: [employer]   [facility] Agency: [facility] Date HH Agency Contacted: 07/26/22 Time HH Agency Contacted: 1229 Representative spoke with at [facility] Agency: Morrie Sheldon  Social Determinants of Health (SDOH) Interventions SDOH Screenings   Food Insecurity: No Food Insecurity (07/25/2022)  Housing: Low Risk  (07/25/2022)  Transportation Needs: No Transportation Needs (07/25/2022)  Utilities: Not At Risk (07/25/2022)  Tobacco Use: High Risk (07/27/2022)     Readmission Risk Interventions    07/26/2022   12:28 PM 09/30/2021    9:22 AM 06/12/2021   12:11 PM  Readmission Risk Prevention Plan   Transportation Screening Complete Complete Complete  PCP or Specialist Appt within 5-7 Days Not Complete    PCP or Specialist Appt within 3-5 Days   Complete  Home Care Screening Complete    Medication Review (RN CM) Complete    HRI or Home Care Consult   Complete  Social Work Consult for Recovery Care Planning/Counseling   Complete  Palliative Care Screening   Not Applicable  Medication Review [employer])  Complete Complete  HRI or Home Care Consult  Complete   SW Recovery Care/Counseling Consult  Complete   Palliative Care Screening  Not Applicable   Skilled Nursing Facility  Complete

## 2022-08-02 ENCOUNTER — Telehealth: Payer: Self-pay | Admitting: Orthopedic Surgery

## 2022-08-02 ENCOUNTER — Ambulatory Visit: Payer: Medicare Other | Admitting: Physician Assistant

## 2022-08-02 ENCOUNTER — Encounter: Payer: Self-pay | Admitting: Orthopedic Surgery

## 2022-08-02 NOTE — Telephone Encounter (Signed)
  Daughter sent my chart message asked me to call her  I called daughter [name] she states the brace for moms back is too large and heavy wants to know if you can order something else  I told her I dont think you ordered the brace but I will ask and make sure  She also asked about a follow up   I told her you said about the wrist nonoperative care of the right wrist follow-up in 6 weeks for x-ray   Did you order the back brace ? If so can you change to something lighter?

## 2022-08-02 NOTE — Telephone Encounter (Signed)
If size is not correct please call [facility] and they can resize; if they want something else entirely, then just order a corset

## 2022-08-03 NOTE — Telephone Encounter (Signed)
She has a corset   Patient states it fits its just heavy   She wants note to d/c bulky brace wear a corset/ wrap type brace instead  Note placed at front desk.

## 2022-08-03 NOTE — Telephone Encounter (Signed)
[name] left message and I tried calling back twice already , but got a message saying to try again later.

## 2022-08-10 ENCOUNTER — Emergency Department (HOSPITAL_COMMUNITY)
Admission: EM | Admit: 2022-08-10 | Discharge: 2022-08-10 | Disposition: A | Discharge status: Skilled Nursing Facility | Payer: Medicare Other | Attending: Student | Admitting: Student

## 2022-08-10 ENCOUNTER — Encounter (HOSPITAL_COMMUNITY): Payer: Self-pay | Admitting: Emergency Medicine

## 2022-08-10 ENCOUNTER — Other Ambulatory Visit: Payer: Self-pay

## 2022-08-10 ENCOUNTER — Emergency Department (HOSPITAL_COMMUNITY): Payer: Medicare Other

## 2022-08-10 DIAGNOSIS — I509 Heart failure, unspecified: Secondary | ICD-10-CM | POA: Diagnosis not present

## 2022-08-10 DIAGNOSIS — E860 Dehydration: Secondary | ICD-10-CM | POA: Insufficient documentation

## 2022-08-10 DIAGNOSIS — R531 Weakness: Secondary | ICD-10-CM | POA: Diagnosis present

## 2022-08-10 DIAGNOSIS — Z20822 Contact with and (suspected) exposure to covid-19: Secondary | ICD-10-CM | POA: Insufficient documentation

## 2022-08-10 DIAGNOSIS — E875 Hyperkalemia: Secondary | ICD-10-CM | POA: Diagnosis not present

## 2022-08-10 DIAGNOSIS — R4182 Altered mental status, unspecified: Secondary | ICD-10-CM | POA: Diagnosis not present

## 2022-08-10 DIAGNOSIS — F0390 Unspecified dementia without behavioral disturbance: Secondary | ICD-10-CM | POA: Insufficient documentation

## 2022-08-10 DIAGNOSIS — J449 Chronic obstructive pulmonary disease, unspecified: Secondary | ICD-10-CM | POA: Insufficient documentation

## 2022-08-10 LAB — COMPREHENSIVE METABOLIC PANEL
ALT: 14 U/L (ref 0–44)
AST: 23 U/L (ref 15–41)
Albumin: 3.4 g/dL — ABNORMAL LOW (ref 3.5–5.0)
Alkaline Phosphatase: 118 U/L (ref 38–126)
Anion gap: 9 (ref 5–15)
BUN: 31 mg/dL — ABNORMAL HIGH (ref 8–23)
CO2: 30 mmol/L (ref 22–32)
Calcium: 9 mg/dL (ref 8.9–10.3)
Chloride: 99 mmol/L (ref 98–111)
Creatinine, Ser: 1.26 mg/dL — ABNORMAL HIGH (ref 0.44–1.00)
GFR, Estimated: 42 mL/min — ABNORMAL LOW (ref >60)
Glucose, Bld: 119 mg/dL — ABNORMAL HIGH (ref 70–99)
Potassium: 5.2 mmol/L — ABNORMAL HIGH (ref 3.5–5.1)
Sodium: 138 mmol/L (ref 135–145)
Total Bilirubin: 0.5 mg/dL (ref 0.3–1.2)
Total Protein: 7.4 g/dL (ref 6.5–8.1)

## 2022-08-10 LAB — LACTIC ACID, PLASMA
Lactic Acid, Venous: 1.6 mmol/L (ref 0.5–1.9)
Lactic Acid, Venous: 1.3 mmol/L (ref 0.5–1.9)

## 2022-08-10 LAB — URINALYSIS, ROUTINE W REFLEX MICROSCOPIC
Bilirubin Urine: NEGATIVE
Glucose, UA: NEGATIVE mg/dL
Hgb urine dipstick: NEGATIVE
Ketones, ur: NEGATIVE mg/dL
Leukocytes,Ua: NEGATIVE
Nitrite: NEGATIVE
Protein, ur: NEGATIVE mg/dL
Specific Gravity, Urine: 1.01 (ref 1.005–1.030)
pH: 6 (ref 5.0–8.0)

## 2022-08-10 LAB — CBC
HCT: 33.9 % — ABNORMAL LOW (ref 36.0–46.0)
Hemoglobin: 10.5 g/dL — ABNORMAL LOW (ref 12.0–15.0)
MCH: 28.6 pg (ref 26.0–34.0)
MCHC: 31 g/dL (ref 30.0–36.0)
MCV: 92.4 fL (ref 80.0–100.0)
Platelets: 326 10*3/uL (ref 150–400)
RBC: 3.67 MIL/uL — ABNORMAL LOW (ref 3.87–5.11)
RDW: 12.9 % (ref 11.5–15.5)
WBC: 6.9 10*3/uL (ref 4.0–10.5)
nRBC: 0 % (ref 0.0–0.2)

## 2022-08-10 LAB — BASIC METABOLIC PANEL
Anion gap: 9 (ref 5–15)
BUN: 29 mg/dL — ABNORMAL HIGH (ref 8–23)
CO2: 28 mmol/L (ref 22–32)
Calcium: 8.7 mg/dL — ABNORMAL LOW (ref 8.9–10.3)
Chloride: 101 mmol/L (ref 98–111)
Creatinine, Ser: 1.13 mg/dL — ABNORMAL HIGH (ref 0.44–1.00)
GFR, Estimated: 48 mL/min — ABNORMAL LOW (ref >60)
Glucose, Bld: 100 mg/dL — ABNORMAL HIGH (ref 70–99)
Potassium: 4.6 mmol/L (ref 3.5–5.1)
Sodium: 138 mmol/L (ref 135–145)

## 2022-08-10 LAB — RESP PANEL BY RT-PCR (RSV, FLU A&B, COVID)  RVPGX2
Influenza A by PCR: NEGATIVE
Influenza B by PCR: NEGATIVE
Resp Syncytial Virus by PCR: NEGATIVE
SARS Coronavirus 2 by RT PCR: NEGATIVE

## 2022-08-10 LAB — CBG MONITORING, ED: Glucose-Capillary: 115 mg/dL — ABNORMAL HIGH (ref 70–99)

## 2022-08-10 MED ORDER — LACTATED RINGERS IV BOLUS: 1000.0000 mL | Freq: Once | INTRAVENOUS | Status: DC

## 2022-08-10 MED ORDER — IPRATROPIUM-ALBUTEROL 0.5-2.5 (3) MG/3ML IN SOLN
3.0000 mL | Freq: Once | RESPIRATORY_TRACT | Status: AC
  Administered 2022-08-10: 3 mL via RESPIRATORY_TRACT
  Filled 2022-08-10: qty 3

## 2022-08-10 MED ORDER — LACTATED RINGERS IV BOLUS
500.0000 mL | Freq: Once | INTRAVENOUS | Status: AC
  Administered 2022-08-10: 500 mL via INTRAVENOUS

## 2022-08-10 NOTE — Care Management (Signed)
ED RNCM consulted concerning patient's daughter Twanna Hy requesting that the patient not return to SNF but to have her transferred to another SNF.  RNCM explained that we are not able to transfer from ED to another SNF this would need to be SNF to SNF transfer.  She verbalized her concerns at [facility], instructed her to contact DON to voice her concerns and at that time if she still feels unsatisfied she could discuss the possibility of a transfer. Offered to contact DON tonight, no answer at the facility LVM for return call. Also, provided contact info for the ombudsman of [place] if needed. Daughter was appreciative for the information. Updated EDP.

## 2022-08-10 NOTE — ED Triage Notes (Signed)
Pt at [facility] for rehab, broken back per EMS. Yesterday pt could walk per ems but can't today. [place] called charge RN to advised pt family wanted to bring pt here due to rehab not moving fast enough. Pt arrived alert/oriented to most. Mild confusion.

## 2022-08-10 NOTE — ED Notes (Signed)
Patient transported to CT 

## 2022-08-10 NOTE — ED Provider Notes (Signed)
[place] EMERGENCY DEPARTMENT AT [facility] Provider Note   CSN: [id number] Arrival date & time: 08/10/22  1338     History {Add pertinent medical, surgical, social history, OB history to HPI:1} Chief Complaint  Patient presents with   Weakness    [name] is a 88 y.o. female. She has PMH of CHF, COPD, pulmonary fibrosis, dementia.  He has been to the hospital for lumbar fractures that were ultimately nonoperative of L1 and L2 and a UTI with discharged on 7/25 and discharged to [facility] for rehab.  She has been doing rehab and doing okay until apparent this morning.  Patient states that she got this morning and did her therapy, lay down for a bit when she went to get up to go to the bathroom she could not walk.  She states she was told they believe she has UTI.  Does admit to dysuria today when she got to the bathroom.  Denies fever chills flank pain abdominal pain or other complaints.  Her Daughter [name] she reports she was more confused and off of her baseline today, she was concerned because the nursing home was not worried about the change. She does not want her to return there.  She agrees to imaging of her head and of her L-spine to ensure no changes given her vertebral fractures, no reported falls and no signs of trauma and no reported increased pain but patient's daughter is concerned that she may have had an unwitnessed fall.   Weakness      Home Medications Prior to Admission medications   Medication Sig Start Date End Date Taking? Authorizing Provider  albuterol (PROAIR HFA) 108 (90 Base) MCG/ACT inhaler Inhale 2 puffs into the lungs every 4 (four) hours as needed for wheezing or shortness of breath. Patient taking differently: Inhale 2 puffs into the lungs every 6 (six) hours as needed for wheezing or shortness of breath. 06/12/21   Johnson, Clanford L, MD  ALPRAZolam Prudy Feeler) 0.5 MG tablet Take 1 tablet (0.5 mg total) by mouth 2 (two) times daily. 07/29/22    Catarina Hartshorn, MD  ascorbic acid (VITAMIN C) 500 MG tablet Take 500 mg by mouth daily.    [provider]  diclofenac Sodium (VOLTAREN) 1 % GEL APPLY AS DIRECTED TO THE AFFECTED KNEE FOUR TIMES DAILY. Patient taking differently: Apply 1 Application topically 4 (four) times daily as needed (pain). 08/04/20   Vickki Hearing, MD  donepezil (ARICEPT) 10 MG tablet Take 10 mg by mouth daily. 07/25/19   [provider]  fluticasone furoate-vilanterol (BREO ELLIPTA) 100-25 MCG/ACT AEPB Inhale 1 puff into the lungs every morning.    [provider]  furosemide (LASIX) 40 MG tablet Take 40 mg by mouth daily.    [provider]  ipratropium-albuterol (DUONEB) 0.5-2.5 (3) MG/3ML SOLN Take 3 mLs by nebulization in the morning, at noon, in the evening, and at bedtime.    [provider]  lidocaine (LIDODERM) 5 % Place 1 patch onto the skin daily as needed. Remove & Discard patch within 12 hours or as directed by MD 07/29/22   Catarina Hartshorn, MD  LINZESS 145 MCG CAPS capsule Take 145 mcg by mouth daily as needed (bowel movement). 05/22/21   [provider]  loratadine (CLARITIN) 10 MG tablet Take 10 mg by mouth daily. 08/29/21   [provider]  memantine (NAMENDA) 10 MG tablet Take 10 mg by mouth 2 (two) times daily.    [provider]  Multiple Vitamins-Minerals (ONE A DAY WOMEN 50 PLUS PO) Take 1 tablet by mouth daily.    [provider]  pantoprazole (PROTONIX) 40 MG tablet TAKE (1) TABLET BY MOUTH TWICE A DAY WITH MEALS (BREAKFAST AND SUPPER) 05/24/19   Anice Paganini, NP  simvastatin (ZOCOR) 10 MG tablet Take 10 mg by mouth daily. 02/27/21   [provider]  traMADol (ULTRAM) 50 MG tablet Take 1 tablet (50 mg total) by mouth every 6 (six) hours as needed for moderate pain or severe pain. 07/29/22   Catarina Hartshorn, MD  zolpidem (AMBIEN) 5 MG tablet Take 1 tablet (5 mg total) by mouth at bedtime. 07/29/22   Catarina Hartshorn, MD       Allergies    Codeine, Neurontin [gabapentin], Sulfonamide derivatives, Aloe, Levaquin [levofloxacin], Morphine, Septra [sulfamethoxazole-trimethoprim], Sulfa antibiotics, Trimethoprim, and Vibra-tab [doxycycline]    Review of Systems   Review of Systems  Neurological:  Positive for weakness.    Physical Exam Updated Vital Signs BP (!) 81/52 (BP Location: Left Arm)   Pulse 70   Temp 97.9 F (36.6 C) (Oral)   Resp 18   SpO2 91%  Physical Exam Vitals and nursing note reviewed.  Constitutional:      General: She is not in acute distress.    Appearance: She is well-developed.  HENT:     Head: Normocephalic and atraumatic.     Mouth/Throat:     Mouth: Mucous membranes are moist.  Eyes:     Conjunctiva/sclera: Conjunctivae normal.  Cardiovascular:     Rate and Rhythm: Normal rate and regular rhythm.     Heart sounds: No murmur heard. Pulmonary:     Effort: Pulmonary effort is normal. No respiratory distress.     Breath sounds: Normal breath sounds.  Abdominal:     Palpations: Abdomen is soft.     Tenderness: There is no abdominal tenderness.  Musculoskeletal:        General: No swelling or tenderness. Normal range of motion.     Cervical back: Neck supple.  Skin:    General: Skin is warm and dry.     Capillary Refill: Capillary refill takes less than 2 seconds.  Neurological:     General: No focal deficit present.     Mental Status: She is alert and oriented to person, place, and time.  Psychiatric:        Mood and Affect: Mood normal.     ED Results / Procedures / Treatments   Labs (all labs ordered are listed, but only abnormal results are displayed) Labs Reviewed  CBG MONITORING, ED - Abnormal; Notable for the following components:      Result Value   Glucose-Capillary 115 (*)    All other components within normal limits  URINE CULTURE  CBC  URINALYSIS, ROUTINE W REFLEX MICROSCOPIC  COMPREHENSIVE METABOLIC PANEL  LACTIC ACID, PLASMA  LACTIC ACID, PLASMA     EKG None  Radiology No results found.  Procedures Procedures  {Document cardiac monitor, telemetry assessment procedure when appropriate:1}  Medications Ordered in ED Medications  lactated ringers bolus 500 mL (has no administration in time range)    ED Course/ Medical Decision Making/ A&P Clinical Course as of 08/10/22 2124  Tue Aug 10, 2022  1433 Patient here for generalized weakness that started today, no focal deficits on exam.  No dizziness.  Noted on arrival in hallway to be slightly hypotensive with MAP of 62, moved to bed and repeat blood pressure  had MAP of 71, starting gentle fluids given history of CHF and improvement of blood pressure, awaiting labs. [CB]  1951 Patient brought in by EMS from rehab for some generalized weakness that started today, daughter had wanted her to be transferred.  Stated she seemed off her baseline but is back to her baseline now, Cammie Mcgee are soft on arrival but resolved quickly with small fluid bolus.  BUN slightly elevated.  Planning to repeat this.  Daughter wanted patient transferred to another nursing home.  Discussed that this is not able to be done from the ER.  Discussed with Sherwood Gambler, case management who confirmed that this would need to be done by the rehab facility. Sherwood Gambler is going to call the patient's daughter to reiterate this.  At this time daughter is aware to anticipate discharge back to facility and they can work on transfer  from their [CB]  2110 Initial potassium 5.2 slightly elevated, hemolysis versus mild dehydration.  Repeat after IV fluids is 4.6.  Patient never had EKG changes.  BUN and creatinine improved after IV fluid bolus as well. [CB]    Clinical Course User Index [CB] Cristi Loron,  A, PA-C   {   Click here for ABCD2, HEART and other calculatorsREFRESH Note before signing :1}                              Medical Decision Making This patient presents to the ED for concern of generalized weakness, this involves  an extensive number of treatment options, and is a complaint that carries with it a high risk of complications and morbidity.  The differential diagnosis includes uti, sepsis, dehydration, cva, viral illness, other   Co morbidities that complicate the patient evaluation :   dementia, chf, copd, pulmonary fibrosis   Additional history obtained:  Additional history obtained from EMR External records from outside source obtained and reviewed including notes, lab   Lab Tests:  I Ordered, and personally interpreted labs.  The pertinent results include:  BUN and Cr. Mildly increased from baseline.potassium slightly elevated at 5.3, hemoglobin at baseline at 10.5    Imaging Studies ordered:  I ordered imaging studies including chest x-ray showed no pulmonary edema or infiltrate, so show chronic lung changes and large hiatal hernia; CT head showed no acute intracranial process, CT L-spine showed stable lumbar fractures I independently visualized and interpreted imaging  I agree with the radiologist interpretation   Cardiac Monitoring: / EKG:  The patient was maintained on a cardiac monitor.  I personally viewed and interpreted the cardiac monitored which showed an underlying rhythm of: ***   Consultations Obtained:  I requested consultation with the ***,  and discussed lab and imaging findings as well as pertinent plan - they recommend: ***   Problem List / ED Course / Critical interventions / Medication management  *** I ordered medication including ***  for ***  Reevaluation of the patient after these medicines showed that the patient {resolved/improved/worsened:23923::"improved"} I have reviewed the patients home medicines and have made adjustments as needed   Social Determinants of Health:  ***   Test / Admission - Considered:  ***    Amount and/or Complexity of Data Reviewed Labs: ordered. Radiology: ordered.  Risk Prescription drug  management.   ***  {Document critical care time when appropriate:1} {Document review of labs and clinical decision tools ie heart score, Chads2Vasc2 etc:1}  {Document your independent review of radiology images, and any outside  records:1} {Document your discussion with family members, caretakers, and with consultants:1} {Document social determinants of health affecting pt's care:1} {Document your decision making why or why not admission, treatments were needed:1} Final Clinical Impression(s) / ED Diagnoses Final diagnoses:  None    Rx / DC Orders ED Discharge Orders     None

## 2022-08-10 NOTE — ED Notes (Addendum)
[name]/guardian, Renne Musca, called and reports patient IS NOT to be transferred back to [facility], nor is she well enough to come home yet. Daughter reports all of a sudden today she had AMS and was holding the bottom of the walker trying to ambulate. She reports she recently had a L1 & L2 fracture and afterwards was placed into rehab. She also reports she does not want her mother receiving Morphine or Dilaudid because it makes her hallucinate. Also requesting for pt to be checked for Covid because the rehab facility told her that there was a possibility she might have been around other residents who were Covid positive.

## 2022-08-10 NOTE — Discharge Instructions (Addendum)
A pleasure taking care of you today.  You seem to be slightly dehydrated and your kidney function was slightly elevated.  You need to make sure you are drinking plenty of fluids.  If you have any new or worsening symptoms such as dizziness, or if you develop chest pain, shortness of breath or other worrisome symptoms come back to the ER right away.  Otherwise follow close with your primary care doctor.  As we discussed, we were not able to change her placement from the ER, the social worker from the rehab you are at needs to handle that.  Please discuss this with them tomorrow.  You still have the lumbar fractures.  Since you are not wearing your brace, follow-up with the orthopedic doctor who recommended a corset or another type of brace that may be more comfortable.  If you develop any worsening pain, numbness or tingling or any other worsening symptoms come back to the ER right away.

## 2022-08-26 ENCOUNTER — Other Ambulatory Visit: Payer: Self-pay

## 2022-08-26 ENCOUNTER — Emergency Department (HOSPITAL_COMMUNITY): Payer: Medicare Other

## 2022-08-26 ENCOUNTER — Emergency Department (HOSPITAL_COMMUNITY)
Admission: EM | Admit: 2022-08-26 | Discharge: 2022-08-26 | Disposition: A | Discharge status: Home / Self Care | Payer: Medicare Other | Attending: Emergency Medicine | Admitting: Emergency Medicine

## 2022-08-26 ENCOUNTER — Encounter (HOSPITAL_COMMUNITY): Payer: Self-pay

## 2022-08-26 DIAGNOSIS — S80212A Abrasion, left knee, initial encounter: Secondary | ICD-10-CM | POA: Insufficient documentation

## 2022-08-26 DIAGNOSIS — S51012A Laceration without foreign body of left elbow, initial encounter: Secondary | ICD-10-CM | POA: Diagnosis not present

## 2022-08-26 DIAGNOSIS — W050XXA Fall from non-moving wheelchair, initial encounter: Secondary | ICD-10-CM | POA: Insufficient documentation

## 2022-08-26 DIAGNOSIS — E860 Dehydration: Secondary | ICD-10-CM | POA: Diagnosis not present

## 2022-08-26 DIAGNOSIS — J449 Chronic obstructive pulmonary disease, unspecified: Secondary | ICD-10-CM | POA: Diagnosis not present

## 2022-08-26 DIAGNOSIS — Z7951 Long term (current) use of inhaled steroids: Secondary | ICD-10-CM | POA: Diagnosis not present

## 2022-08-26 DIAGNOSIS — W19XXXA Unspecified fall, initial encounter: Secondary | ICD-10-CM

## 2022-08-26 DIAGNOSIS — M25522 Pain in left elbow: Secondary | ICD-10-CM | POA: Diagnosis present

## 2022-08-26 LAB — URINALYSIS, W/ REFLEX TO CULTURE (INFECTION SUSPECTED)
Bilirubin Urine: NEGATIVE
Glucose, UA: NEGATIVE mg/dL
Hgb urine dipstick: NEGATIVE
Ketones, ur: NEGATIVE mg/dL
Leukocytes,Ua: NEGATIVE
Nitrite: NEGATIVE
Protein, ur: NEGATIVE mg/dL
Specific Gravity, Urine: 1.008 (ref 1.005–1.030)
pH: 6 (ref 5.0–8.0)

## 2022-08-26 LAB — CBC WITH DIFFERENTIAL/PLATELET
Abs Immature Granulocytes: 0.02 10*3/uL (ref 0.00–0.07)
Basophils Absolute: 0.1 10*3/uL (ref 0.0–0.1)
Basophils Relative: 1 %
Eosinophils Absolute: 0.1 10*3/uL (ref 0.0–0.5)
Eosinophils Relative: 2 %
HCT: 41.2 % (ref 36.0–46.0)
Hemoglobin: 12.7 g/dL (ref 12.0–15.0)
Immature Granulocytes: 0 %
Lymphocytes Relative: 16 %
Lymphs Abs: 1.1 10*3/uL (ref 0.7–4.0)
MCH: 28.4 pg (ref 26.0–34.0)
MCHC: 30.8 g/dL (ref 30.0–36.0)
MCV: 92.2 fL (ref 80.0–100.0)
Monocytes Absolute: 0.6 10*3/uL (ref 0.1–1.0)
Monocytes Relative: 9 %
Neutro Abs: 4.9 10*3/uL (ref 1.7–7.7)
Neutrophils Relative %: 72 %
Platelets: 228 10*3/uL (ref 150–400)
RBC: 4.47 MIL/uL (ref 3.87–5.11)
RDW: 12.4 % (ref 11.5–15.5)
WBC: 6.9 10*3/uL (ref 4.0–10.5)
nRBC: 0 % (ref 0.0–0.2)

## 2022-08-26 LAB — COMPREHENSIVE METABOLIC PANEL
ALT: 12 U/L (ref 0–44)
AST: 26 U/L (ref 15–41)
Albumin: 3.7 g/dL (ref 3.5–5.0)
Alkaline Phosphatase: 111 U/L (ref 38–126)
Anion gap: 12 (ref 5–15)
BUN: 32 mg/dL — ABNORMAL HIGH (ref 8–23)
CO2: 28 mmol/L (ref 22–32)
Calcium: 8.6 mg/dL — ABNORMAL LOW (ref 8.9–10.3)
Chloride: 95 mmol/L — ABNORMAL LOW (ref 98–111)
Creatinine, Ser: 1.34 mg/dL — ABNORMAL HIGH (ref 0.44–1.00)
GFR, Estimated: 39 mL/min — ABNORMAL LOW (ref >60)
Glucose, Bld: 87 mg/dL (ref 70–99)
Potassium: 4.9 mmol/L (ref 3.5–5.1)
Sodium: 135 mmol/L (ref 135–145)
Total Bilirubin: 0.4 mg/dL (ref 0.3–1.2)
Total Protein: 8.2 g/dL — ABNORMAL HIGH (ref 6.5–8.1)

## 2022-08-26 MED ORDER — SODIUM CHLORIDE 0.9 % IV BOLUS
500.0000 mL | Freq: Once | INTRAVENOUS | Status: AC
  Administered 2022-08-26: 500 mL via INTRAVENOUS

## 2022-08-26 MED ORDER — ACETAMINOPHEN 500 MG PO TABS: 500.0000 mg | ORAL_TABLET | Freq: Three times a day (TID) | ORAL | 0 refills | Status: DC | PRN

## 2022-08-26 NOTE — ED Notes (Signed)
Water and crackers provided.  °

## 2022-08-26 NOTE — ED Notes (Signed)
Called [facility] x2 for report. No answer. [facility] at bedside to transport pt back to facility.

## 2022-08-26 NOTE — ED Notes (Signed)
Daughter called and updates provided.  Daughter requesting hip and lumbar xray. Explained to daughter that MD performed assessment and patient complains of no pain. Daughter is requesting walker and therapy for patient. Explained to daughter that we don't do therapy in the ER. Daughter then ask how much fluid patient received, this RN reported and daughter says "that's it." Explained to daughter patients has CHF as hx and can't give her to much or we could put her in fluid overload. Asked daughter if she will be picking patient up to go back to [facility] and daughter states "they are responsible for her so they can pick her up." Explained to daughter patient maybe waiting several hours for ambulance and daughter states "well let her wait."

## 2022-08-26 NOTE — ED Triage Notes (Signed)
BIBA from [facility] c/o witnessed fall when slipping out of wheelchair.  Denies hitting head.  Facility concerned about UTI.  Skin tear left elbow.

## 2022-08-26 NOTE — ED Notes (Signed)
Patient assisted to restroom.  

## 2022-08-26 NOTE — ED Notes (Signed)
Attempted to call report to [facility]. Will try again

## 2022-08-26 NOTE — ED Provider Notes (Signed)
[facility] EMERGENCY DEPARTMENT AT [facility] Provider Note   CSN: [id number] Arrival date & time: 08/26/22  1337     History  Chief Complaint  Patient presents with   Marletta Lor    [name] is a 88 y.o. female.   Fall  Patient reportedly had a fall.  Slipped out of wheelchair.  States left elbow hurts.  Denies hitting head.  States she does feel little confused.  That was predating the fall.  Also states some left knee pain.  Facility reportedly concerned about UTI.  Reviewed recent notes and had no UTI at that time but most recent visit before that did have UTI.    Past Medical History:  Diagnosis Date   Anxiety    Chronic diastolic (congestive) heart failure (HCC)    Chronic obstructive pulmonary disease, unspecified (HCC)    COPD (chronic obstructive pulmonary disease) (HCC)    GERD (gastroesophageal reflux disease)    Hyperlipidemia    IBS (irritable bowel syndrome)    Lumbago with sciatica    Osteoporosis    Overactive bladder    Pneumonia    Pulmonary fibrosis (HCC)     Home Medications Prior to Admission medications   Medication Sig Start Date End Date Taking? Authorizing Provider  acetaminophen (TYLENOL) 500 MG tablet Take 1 tablet (500 mg total) by mouth every 8 (eight) hours as needed. 08/26/22  Yes Benjiman Core, MD  albuterol (PROAIR HFA) 108 (90 Base) MCG/ACT inhaler Inhale 2 puffs into the lungs every 4 (four) hours as needed for wheezing or shortness of breath. Patient taking differently: Inhale 2 puffs into the lungs every 6 (six) hours as needed for wheezing or shortness of breath. 06/12/21   Johnson, Clanford L, MD  ALPRAZolam Prudy Feeler) 0.5 MG tablet Take 1 tablet (0.5 mg total) by mouth 2 (two) times daily. 07/29/22   Catarina Hartshorn, MD  ascorbic acid (VITAMIN C) 500 MG tablet Take 500 mg by mouth daily.    [provider]  diclofenac Sodium (VOLTAREN) 1 % GEL APPLY AS DIRECTED TO THE AFFECTED KNEE FOUR TIMES DAILY. Patient taking  differently: Apply 1 Application topically 4 (four) times daily as needed (pain). 08/04/20   Vickki Hearing, MD  donepezil (ARICEPT) 10 MG tablet Take 10 mg by mouth daily. 07/25/19   [provider]  fluticasone furoate-vilanterol (BREO ELLIPTA) 100-25 MCG/ACT AEPB Inhale 1 puff into the lungs every morning.    [provider]  furosemide (LASIX) 40 MG tablet Take 40 mg by mouth daily.    [provider]  ipratropium-albuterol (DUONEB) 0.5-2.5 (3) MG/3ML SOLN Take 3 mLs by nebulization in the morning, at noon, in the evening, and at bedtime.    [provider]  lidocaine (LIDODERM) 5 % Place 1 patch onto the skin daily as needed. Remove & Discard patch within 12 hours or as directed by MD 07/29/22   Catarina Hartshorn, MD  LINZESS 145 MCG CAPS capsule Take 145 mcg by mouth daily as needed (bowel movement). 05/22/21   [provider]  loratadine (CLARITIN) 10 MG tablet Take 10 mg by mouth daily. 08/29/21   [provider]  memantine (NAMENDA) 10 MG tablet Take 10 mg by mouth 2 (two) times daily.    [provider]  Multiple Vitamins-Minerals (ONE A DAY WOMEN 50 PLUS PO) Take 1 tablet by mouth daily.    [provider]  pantoprazole (PROTONIX) 40 MG tablet TAKE (1) TABLET BY MOUTH TWICE A DAY WITH  MEALS (BREAKFAST AND SUPPER) 05/24/19   Anice Paganini, NP  simvastatin (ZOCOR) 10 MG tablet Take 10 mg by mouth daily. 02/27/21   [provider]  traMADol (ULTRAM) 50 MG tablet Take 1 tablet (50 mg total) by mouth every 6 (six) hours as needed for moderate pain or severe pain. 07/29/22   Catarina Hartshorn, MD  zolpidem (AMBIEN) 5 MG tablet Take 1 tablet (5 mg total) by mouth at bedtime. 07/29/22   Catarina Hartshorn, MD      Allergies    Codeine, Neurontin [gabapentin], Sulfonamide derivatives, Aloe, Levaquin [levofloxacin], Morphine, Septra [sulfamethoxazole-trimethoprim], Sulfa antibiotics, Trimethoprim, and Vibra-tab [doxycycline]    Review of  Systems   Review of Systems  Physical Exam Updated Vital Signs BP 112/76   Pulse 63   Temp 98 F (36.7 C)   Resp 12   SpO2 94%  Physical Exam Vitals and nursing note reviewed.  Cardiovascular:     Rate and Rhythm: Regular rhythm.  Pulmonary:     Breath sounds: No wheezing.  Abdominal:     Tenderness: There is no abdominal tenderness.  Musculoskeletal:        General: Tenderness present.  Skin:    Capillary Refill: Capillary refill takes less than 2 seconds.  Neurological:     Mental Status: She is alert.     Comments: Awake and answers questions.     Skin tear on left elbow without underlying bony tenderness.  ED Results / Procedures / Treatments   Labs (all labs ordered are listed, but only abnormal results are displayed) Labs Reviewed  URINALYSIS, W/ REFLEX TO CULTURE (INFECTION SUSPECTED) - Abnormal; Notable for the following components:      Result Value   APPearance HAZY (*)    Bacteria, UA MANY (*)    All other components within normal limits  COMPREHENSIVE METABOLIC PANEL - Abnormal; Notable for the following components:   Chloride 95 (*)    BUN 32 (*)    Creatinine, Ser 1.34 (*)    Calcium 8.6 (*)    Total Protein 8.2 (*)    GFR, Estimated 39 (*)    All other components within normal limits  CBC WITH DIFFERENTIAL/PLATELET    EKG None  Radiology DG Knee Complete 4 Views Left  Result Date: 08/26/2022 CLINICAL DATA:  fall EXAM: LEFT KNEE - COMPLETE 4+ VIEW COMPARISON:  None Available. FINDINGS: There is diffuse osteopenia of the visualized osseous structures. No acute fracture or dislocation. No aggressive osseous lesion. There are degenerative changes of the knee joint in the form of mildly reduced tibio-femoral compartment joint space, tibial spiking and osteophytosis. No knee effusion or focal soft tissue swelling. No radiopaque foreign bodies. IMPRESSION: No acute fracture or dislocation. Mild degenerative changes of the knee. Electronically Signed    By: Jules Schick M.D.   On: 08/26/2022 16:29   DG Elbow Complete Left  Result Date: 08/26/2022 CLINICAL DATA:  fall EXAM: LEFT ELBOW - COMPLETE 3+ VIEW COMPARISON:  None Available. FINDINGS: There is diffuse osteopenia of the visualized osseous structures. No acute fracture or dislocation. No aggressive osseous lesion. Mild degenerative arthritis of elbow joint. No radiopaque foreign bodies. Soft tissues are within normal limits. IMPRESSION: *No acute fracture or dislocation. Electronically Signed   By: Jules Schick M.D.   On: 08/26/2022 16:27    Procedures Procedures    Medications Ordered in ED Medications  sodium chloride 0.9 % bolus 500 mL (0 mLs Intravenous Stopped 08/26/22 1735)    ED Course/  Medical Decision Making/ A&P                                 Medical Decision Making Amount and/or Complexity of Data Reviewed Labs: ordered. Radiology: ordered.   Patient reportedly fell.  States she is feeling weak.  Skin tear on left elbow.  Otherwise mild abrasion to front of left knee.  Will get x-ray of elbow and knee.  Will get basic blood work.  Will get urinalysis.  Blood work does show a mildly elevated creatinine.  Blood pressures improved.  Fluid boluses been given.  I think likely component of dehydration.  Discussed with patient's daughter.  She is worried because patient had been more confused.  Urine does not show infection.  Does have some bacteria but no white cells leukocytes or nitrite.  Has had previous resistant E. coli.  Do not think this is an infection.  Patient's daughter was worried that she could have hip or back fractures.  I will have now ranged both hips that is painless.  No tenderness to palpation.  Also no current lumbar tenderness.  Do not think any more imaging at this time.  Has had recent lumbar fractures and has a brace she is supposed be wearing but doubt severe new injury.  Appears stable for discharge back to nursing home.        Final  Clinical Impression(s) / ED Diagnoses Final diagnoses:  Fall, initial encounter  Dehydration  Skin tear of elbow without complication, left, initial encounter    Rx / DC Orders ED Discharge Orders          Ordered    acetaminophen (TYLENOL) 500 MG tablet  Every 8 hours PRN        08/26/22 1802              Benjiman Core, MD 08/26/22 [phone]

## 2022-08-26 NOTE — ED Notes (Signed)
assisted patient to restroom. Pt unable to go at this time.

## 2022-08-27 ENCOUNTER — Emergency Department (HOSPITAL_COMMUNITY): Payer: Medicare Other

## 2022-08-27 ENCOUNTER — Other Ambulatory Visit: Payer: Self-pay

## 2022-08-27 ENCOUNTER — Observation Stay (HOSPITAL_COMMUNITY)
Admission: EM | Admit: 2022-08-27 | Discharge: 2022-08-30 | Disposition: A | Discharge status: Home Health | Payer: Medicare Other | Attending: Internal Medicine | Admitting: Internal Medicine

## 2022-08-27 ENCOUNTER — Encounter (HOSPITAL_COMMUNITY): Payer: Self-pay | Admitting: Emergency Medicine

## 2022-08-27 DIAGNOSIS — I110 Hypertensive heart disease with heart failure: Secondary | ICD-10-CM | POA: Diagnosis not present

## 2022-08-27 DIAGNOSIS — Z96642 Presence of left artificial hip joint: Secondary | ICD-10-CM | POA: Insufficient documentation

## 2022-08-27 DIAGNOSIS — E860 Dehydration: Secondary | ICD-10-CM | POA: Insufficient documentation

## 2022-08-27 DIAGNOSIS — R627 Adult failure to thrive: Secondary | ICD-10-CM | POA: Insufficient documentation

## 2022-08-27 DIAGNOSIS — E876 Hypokalemia: Secondary | ICD-10-CM | POA: Insufficient documentation

## 2022-08-27 DIAGNOSIS — I959 Hypotension, unspecified: Secondary | ICD-10-CM | POA: Diagnosis present

## 2022-08-27 DIAGNOSIS — R531 Weakness: Secondary | ICD-10-CM

## 2022-08-27 DIAGNOSIS — Z79899 Other long term (current) drug therapy: Secondary | ICD-10-CM | POA: Diagnosis not present

## 2022-08-27 DIAGNOSIS — M25559 Pain in unspecified hip: Secondary | ICD-10-CM | POA: Insufficient documentation

## 2022-08-27 DIAGNOSIS — R7402 Elevation of levels of lactic acid dehydrogenase (LDH): Secondary | ICD-10-CM | POA: Insufficient documentation

## 2022-08-27 DIAGNOSIS — E782 Mixed hyperlipidemia: Secondary | ICD-10-CM | POA: Diagnosis present

## 2022-08-27 DIAGNOSIS — M25552 Pain in left hip: Secondary | ICD-10-CM | POA: Diagnosis not present

## 2022-08-27 DIAGNOSIS — W050XXA Fall from non-moving wheelchair, initial encounter: Secondary | ICD-10-CM | POA: Diagnosis not present

## 2022-08-27 DIAGNOSIS — J449 Chronic obstructive pulmonary disease, unspecified: Secondary | ICD-10-CM | POA: Diagnosis not present

## 2022-08-27 DIAGNOSIS — Z87891 Personal history of nicotine dependence: Secondary | ICD-10-CM | POA: Insufficient documentation

## 2022-08-27 DIAGNOSIS — I5032 Chronic diastolic (congestive) heart failure: Secondary | ICD-10-CM | POA: Diagnosis not present

## 2022-08-27 DIAGNOSIS — S0990XA Unspecified injury of head, initial encounter: Secondary | ICD-10-CM | POA: Insufficient documentation

## 2022-08-27 DIAGNOSIS — K219 Gastro-esophageal reflux disease without esophagitis: Secondary | ICD-10-CM | POA: Diagnosis present

## 2022-08-27 DIAGNOSIS — E872 Acidosis, unspecified: Secondary | ICD-10-CM | POA: Insufficient documentation

## 2022-08-27 LAB — LACTIC ACID, PLASMA
Lactic Acid, Venous: 2.8 mmol/L (ref 0.5–1.9)
Lactic Acid, Venous: 2.2 mmol/L (ref 0.5–1.9)

## 2022-08-27 LAB — COMPREHENSIVE METABOLIC PANEL
ALT: 11 U/L (ref 0–44)
AST: 22 U/L (ref 15–41)
Albumin: 3.3 g/dL — ABNORMAL LOW (ref 3.5–5.0)
Alkaline Phosphatase: 96 U/L (ref 38–126)
Anion gap: 13 (ref 5–15)
BUN: 26 mg/dL — ABNORMAL HIGH (ref 8–23)
CO2: 26 mmol/L (ref 22–32)
Calcium: 8.6 mg/dL — ABNORMAL LOW (ref 8.9–10.3)
Chloride: 95 mmol/L — ABNORMAL LOW (ref 98–111)
Creatinine, Ser: 1.34 mg/dL — ABNORMAL HIGH (ref 0.44–1.00)
GFR, Estimated: 39 mL/min — ABNORMAL LOW (ref >60)
Glucose, Bld: 168 mg/dL — ABNORMAL HIGH (ref 70–99)
Potassium: 3.2 mmol/L — ABNORMAL LOW (ref 3.5–5.1)
Sodium: 134 mmol/L — ABNORMAL LOW (ref 135–145)
Total Bilirubin: 0.4 mg/dL (ref 0.3–1.2)
Total Protein: 7.1 g/dL (ref 6.5–8.1)

## 2022-08-27 LAB — CBC WITH DIFFERENTIAL/PLATELET
Abs Immature Granulocytes: 0.02 10*3/uL (ref 0.00–0.07)
Basophils Absolute: 0 10*3/uL (ref 0.0–0.1)
Basophils Relative: 0 %
Eosinophils Absolute: 0.2 10*3/uL (ref 0.0–0.5)
Eosinophils Relative: 3 %
HCT: 34.6 % — ABNORMAL LOW (ref 36.0–46.0)
Hemoglobin: 10.7 g/dL — ABNORMAL LOW (ref 12.0–15.0)
Immature Granulocytes: 0 %
Lymphocytes Relative: 13 %
Lymphs Abs: 1 10*3/uL (ref 0.7–4.0)
MCH: 28.3 pg (ref 26.0–34.0)
MCHC: 30.9 g/dL (ref 30.0–36.0)
MCV: 91.5 fL (ref 80.0–100.0)
Monocytes Absolute: 1 10*3/uL (ref 0.1–1.0)
Monocytes Relative: 12 %
Neutro Abs: 5.7 10*3/uL (ref 1.7–7.7)
Neutrophils Relative %: 72 %
Platelets: 193 10*3/uL (ref 150–400)
RBC: 3.78 MIL/uL — ABNORMAL LOW (ref 3.87–5.11)
RDW: 12.5 % (ref 11.5–15.5)
WBC: 7.9 10*3/uL (ref 4.0–10.5)
nRBC: 0 % (ref 0.0–0.2)

## 2022-08-27 MED ORDER — SODIUM CHLORIDE 0.9 % IV BOLUS
500.0000 mL | Freq: Once | INTRAVENOUS | Status: AC
  Administered 2022-08-27: 500 mL via INTRAVENOUS

## 2022-08-27 MED ORDER — SODIUM CHLORIDE 0.9 % IV BOLUS
500.0000 mL | Freq: Once | INTRAVENOUS | Status: AC
  Administered 2022-08-28: 500 mL via INTRAVENOUS

## 2022-08-27 NOTE — ED Notes (Signed)
Date and time results received: 08/27/22 2130 (use smartphrase ".now" to insert current time)  Test: lactic  Critical Value: 2.2  Name of Provider Notified: Rubin Payor  Orders Received? Or Actions Taken?: receiving fluids at this time

## 2022-08-27 NOTE — ED Notes (Signed)
Family updated as to patient's status.

## 2022-08-27 NOTE — ED Notes (Signed)
MD notified about BP. Provider at bedside. Rectal temp taken to further investigate temperature

## 2022-08-27 NOTE — ED Triage Notes (Signed)
Pt had fall yesterday and was seen at [facility]. Pt was cleared for discharge last night. PT family called out to insist pt have her hips xrayed. Pt is complaining of pain of a 5/10

## 2022-08-27 NOTE — ED Notes (Signed)
Pt not currently in room

## 2022-08-27 NOTE — ED Provider Notes (Addendum)
[name]   CSN: [id number] Arrival date & time: 08/27/22  1818     History  Chief Complaint  Patient presents with   Marletta Lor    [name] is a 88 y.o. female.   Fall  Patient presents after fall yesterday.  Does have some pain in her hips.  I actually saw her yesterday.  Had x-ray of her elbow and knee done.  Had good range of motion of the hips.  Family reportedly called and patient was sent in due to need for getting hip x-ray today.  Patient states she continues to hurt like she has previously.  Upon arrival though found to be hypotensive and mild hypoxia.  Denies lightheadedness dizziness.  No cough.     Home Medications Prior to Admission medications   Medication Sig Start Date End Date Taking? Authorizing Provider  acetaminophen (TYLENOL) 500 MG tablet Take 1 tablet (500 mg total) by mouth every 8 (eight) hours as needed. 08/26/22   Benjiman Core, MD  albuterol (PROAIR HFA) 108 (90 Base) MCG/ACT inhaler Inhale 2 puffs into the lungs every 4 (four) hours as needed for wheezing or shortness of breath. Patient taking differently: Inhale 2 puffs into the lungs every 6 (six) hours as needed for wheezing or shortness of breath. 06/12/21   Johnson, Clanford L, MD  ALPRAZolam Prudy Feeler) 0.5 MG tablet Take 1 tablet (0.5 mg total) by mouth 2 (two) times daily. 07/29/22   Catarina Hartshorn, MD  ascorbic acid (VITAMIN C) 500 MG tablet Take 500 mg by mouth daily.    [provider]  diclofenac Sodium (VOLTAREN) 1 % GEL APPLY AS DIRECTED TO THE AFFECTED KNEE FOUR TIMES DAILY. Patient taking differently: Apply 1 Application topically 4 (four) times daily as needed (pain). 08/04/20   Vickki Hearing, MD  donepezil (ARICEPT) 10 MG tablet Take 10 mg by mouth daily. 07/25/19   [provider]  fluticasone furoate-vilanterol (BREO ELLIPTA) 100-25 MCG/ACT AEPB Inhale 1 puff into the lungs every morning.    [provider]  furosemide (LASIX) 40 MG tablet Take 40 mg by mouth daily.    [provider]  ipratropium-albuterol (DUONEB) 0.5-2.5 (3) MG/3ML SOLN Take 3 mLs by nebulization in the morning, at noon, in the evening, and at bedtime.    [provider]  lidocaine (LIDODERM) 5 % Place 1 patch onto the skin daily as needed. Remove & Discard patch within 12 hours or as directed by MD 07/29/22   Catarina Hartshorn, MD  LINZESS 145 MCG CAPS capsule Take 145 mcg by mouth daily as needed (bowel movement). 05/22/21   [provider]  loratadine (CLARITIN) 10 MG tablet Take 10 mg by mouth daily. 08/29/21   [provider]  memantine (NAMENDA) 10 MG tablet Take 10 mg by mouth 2 (two) times daily.    [provider]  Multiple Vitamins-Minerals (ONE A DAY WOMEN 50 PLUS PO) Take 1 tablet by mouth daily.    [provider]  pantoprazole (PROTONIX) 40 MG tablet TAKE (1) TABLET BY MOUTH TWICE A DAY WITH MEALS (BREAKFAST AND SUPPER) 05/24/19   Anice Paganini, NP  simvastatin (ZOCOR) 10 MG tablet Take 10 mg by mouth daily. 02/27/21   [provider]  traMADol (ULTRAM) 50 MG tablet Take 1 tablet (50 mg total) by mouth every 6 (six) hours as needed for moderate pain or severe pain. 07/29/22   Catarina Hartshorn, MD  zolpidem Remus Loffler)  5 MG tablet Take 1 tablet (5 mg total) by mouth at bedtime. 07/29/22   Catarina Hartshorn, MD      Allergies    Codeine, Neurontin [gabapentin], Sulfonamide derivatives, Aloe, Levaquin [levofloxacin], Morphine, Septra [sulfamethoxazole-trimethoprim], Sulfa antibiotics, Trimethoprim, and Vibra-tab [doxycycline]    Review of Systems   Review of Systems  Physical Exam Updated Vital Signs BP (!) 92/52   Pulse 73   Temp 98.8 F (37.1 C) (Oral)   Resp 10   Ht 4\' 8"  (1.422 m)   Wt 49 kg   SpO2 98%   BMI 24.22 kg/m  Physical Exam Vitals and nursing [name] reviewed.  HENT:     Head: Atraumatic.     Comments: Cyst to left posterior neck. Eyes:      Pupils: Pupils are equal, round, and reactive to light.  Chest:     Chest wall: No tenderness.  Abdominal:     Tenderness: There is no abdominal tenderness.  Musculoskeletal:        General: Tenderness present.     Comments: Mild tenderness to left hip.  No deformity.  Does range without much change in pain.  Similar range of motion on right hip.  Skin:    Capillary Refill: Capillary refill takes less than 2 seconds.  Neurological:     Mental Status: She is alert and oriented to person, place, and time.     ED Results / Procedures / Treatments   Labs (all labs ordered are listed, but only abnormal results are displayed) Labs Reviewed  COMPREHENSIVE METABOLIC PANEL - Abnormal; Notable for the following components:      Result Value   Sodium 134 (*)    Potassium 3.2 (*)    Chloride 95 (*)    Glucose, Bld 168 (*)    BUN 26 (*)    Creatinine, Ser 1.34 (*)    Calcium 8.6 (*)    Albumin 3.3 (*)    GFR, Estimated 39 (*)    All other components within normal limits  LACTIC ACID, PLASMA - Abnormal; Notable for the following components:   Lactic Acid, Venous 2.8 (*)    All other components within normal limits  LACTIC ACID, PLASMA - Abnormal; Notable for the following components:   Lactic Acid, Venous 2.2 (*)    All other components within normal limits  CBC WITH DIFFERENTIAL/PLATELET - Abnormal; Notable for the following components:   RBC 3.78 (*)    Hemoglobin 10.7 (*)    HCT 34.6 (*)    All other components within normal limits  CULTURE, BLOOD (ROUTINE X 2)  CULTURE, BLOOD (ROUTINE X 2)    EKG None  Radiology CT Head Wo Contrast  Result Date: 08/27/2022 CLINICAL DATA:  Fall and trauma to the head. EXAM: CT HEAD WITHOUT CONTRAST TECHNIQUE: Contiguous axial images were obtained from the base of the skull through the vertex without intravenous contrast. RADIATION DOSE REDUCTION: This exam was performed according to the departmental dose-optimization program which includes  automated exposure control, adjustment of the mA and/or kV according to patient size and/or use of iterative reconstruction technique. COMPARISON:  Head CT dated 08/10/2022. FINDINGS: Brain: Mild age-related atrophy and moderate chronic microvascular ischemic changes. There is no acute intracranial hemorrhage. No mass effect or midline shift. No extra-axial fluid collection. Vascular: No hyperdense vessel or unexpected calcification. Skull: Normal. Negative for fracture or focal lesion. Sinuses/Orbits: Mild mucoperiosteal thickening of paranasal sinuses. No air-fluid level. The left mastoid air cells are clear. Right mastoid effusions.  Other: Partially visualized 2.3 x 5.4 cm mass with soft tissue air in the left side of the neck, possibly a cavitary lesion and concerning for malignancy. Clinical correlation is recommended. IMPRESSION: 1. No acute intracranial pathology. 2. Mild age-related atrophy and moderate chronic microvascular ischemic changes. 3. Partially visualized cavitary soft tissue mass in the left side of the neck concerning for malignancy. Correlation with clinical exam recommended. Electronically Signed   By: Elgie Collard M.D.   On: 08/27/2022 21:23   DG Chest Portable 1 View  Result Date: 08/27/2022 CLINICAL DATA:  Weakness, fall EXAM: PORTABLE CHEST 1 VIEW COMPARISON:  08/10/2022 FINDINGS: There is hyperinflation of the lungs compatible with COPD. Heart is normal size. Tortuous, ectatic aorta with diffuse atherosclerosis. Diffuse interstitial prominence throughout the lungs, stable since prior study. This likely reflects chronic lung disease. Left base atelectasis or scarring. No effusions. No acute bony abnormality. IMPRESSION: COPD/chronic changes. Left base atelectasis or scarring. No active disease. Electronically Signed   By: Charlett Nose M.D.   On: 08/27/2022 19:44   DG Hips Bilat W or Wo Pelvis 5 Views  Result Date: 08/27/2022 CLINICAL DATA:  Weakness, fall EXAM: DG HIP (WITH OR  WITHOUT PELVIS) 5+V BILAT COMPARISON:  12/22/2021 FINDINGS: Prior left hip replacement. 3 screws are seen within the right proximal femur related to remote injury. No acute fracture, subluxation or dislocation. IMPRESSION: Postoperative changes bilaterally as above. No acute bony abnormality. Electronically Signed   By: Charlett Nose M.D.   On: 08/27/2022 19:42   DG Knee Complete 4 Views Left  Result Date: 08/26/2022 CLINICAL DATA:  fall EXAM: LEFT KNEE - COMPLETE 4+ VIEW COMPARISON:  None Available. FINDINGS: There is diffuse osteopenia of the visualized osseous structures. No acute fracture or dislocation. No aggressive osseous lesion. There are degenerative changes of the knee joint in the form of mildly reduced tibio-femoral compartment joint space, tibial spiking and osteophytosis. No knee effusion or focal soft tissue swelling. No radiopaque foreign bodies. IMPRESSION: No acute fracture or dislocation. Mild degenerative changes of the knee. Electronically Signed   By: Jules Schick M.D.   On: 08/26/2022 16:29   DG Elbow Complete Left  Result Date: 08/26/2022 CLINICAL DATA:  fall EXAM: LEFT ELBOW - COMPLETE 3+ VIEW COMPARISON:  None Available. FINDINGS: There is diffuse osteopenia of the visualized osseous structures. No acute fracture or dislocation. No aggressive osseous lesion. Mild degenerative arthritis of elbow joint. No radiopaque foreign bodies. Soft tissues are within normal limits. IMPRESSION: *No acute fracture or dislocation. Electronically Signed   By: Jules Schick M.D.   On: 08/26/2022 16:27    Procedures Procedures    Medications Ordered in ED Medications  sodium chloride 0.9 % bolus 500 mL (has no administration in time range)  sodium chloride 0.9 % bolus 500 mL (0 mLs Intravenous Stopped 08/27/22 2045)  sodium chloride 0.9 % bolus 500 mL (0 mLs Intravenous Stopped 08/27/22 2336)  sodium chloride 0.9 % bolus 500 mL (500 mLs Intravenous New Bag/Given 08/27/22 2336)    ED  Course/ Medical Decision Making/ A&P                                 Medical Decision Making Amount and/or Complexity of Data Reviewed Labs: ordered. Radiology: ordered.  Risk Decision regarding hospitalization.   Patient with fall yesterday.  Sent in for hip x-rays.  Do not imaged yesterday due to good range of motion and  really no tenderness although will image today.  Also hypotension.  Slight hypoxia.  Will check for infection.  Not febrile.  Will give fluid bolus.   Lactic acid mildly elevated.  I think however this secondary to potential dehydration and not sepsis.  Urinalysis from yesterday did not show infection. Creatinine may be a little higher than baseline.  1.34 which is where it was yesterday but higher than 2 weeks ago and a month ago.  X-rays of hips done and reassuring.  Head CT reassuring.  Will give more fluid boluses.  Discussed with the patient's daughter who states that she found her at home with a pill in her mouth and confused and had pressures of 70.  Blood pressure now in the upper 80s.  Patient is awake and appropriate and asking which floor she is going to be admitted to.  CRITICAL CARE Performed by: Benjiman Core Total critical care time: 30 minutes Critical care time was exclusive of separately billable procedures and treating other patients. Critical care was necessary to treat or prevent imminent or life-threatening deterioration. Critical care was time spent personally by me on the following activities: development of treatment plan with patient and/or surrogate as well as nursing, discussions with consultants, evaluation of patient's response to treatment, examination of patient, obtaining history from patient or surrogate, ordering and performing treatments and interventions, ordering and review of laboratory studies, ordering and review of radiographic studies, pulse oximetry and re-evaluation of patient's condition.  Discussed with Dr. Thomes Dinning,  hospitalist.  Since patient still hypotensive patient needs improved blood pressures before we can admit.  Recommends canceling this admission and repeating with improved blood pressure.  Will give another fluid bolus.  Care turned over to Dr. Bebe Shaggy.      Final Clinical Impression(s) / ED Diagnoses Final diagnoses:  Hypotension, unspecified hypotension type    Rx / DC Orders ED Discharge Orders     None         Benjiman Core, MD 08/27/22 5643    Benjiman Core, MD 08/27/22 [phone]

## 2022-08-28 DIAGNOSIS — E872 Acidosis, unspecified: Secondary | ICD-10-CM | POA: Insufficient documentation

## 2022-08-28 DIAGNOSIS — E876 Hypokalemia: Secondary | ICD-10-CM | POA: Diagnosis not present

## 2022-08-28 DIAGNOSIS — E861 Hypovolemia: Secondary | ICD-10-CM

## 2022-08-28 DIAGNOSIS — J449 Chronic obstructive pulmonary disease, unspecified: Secondary | ICD-10-CM

## 2022-08-28 DIAGNOSIS — R531 Weakness: Secondary | ICD-10-CM

## 2022-08-28 DIAGNOSIS — M25559 Pain in unspecified hip: Secondary | ICD-10-CM | POA: Diagnosis not present

## 2022-08-28 DIAGNOSIS — E782 Mixed hyperlipidemia: Secondary | ICD-10-CM

## 2022-08-28 DIAGNOSIS — E860 Dehydration: Secondary | ICD-10-CM | POA: Insufficient documentation

## 2022-08-28 DIAGNOSIS — I959 Hypotension, unspecified: Secondary | ICD-10-CM | POA: Diagnosis not present

## 2022-08-28 DIAGNOSIS — I5032 Chronic diastolic (congestive) heart failure: Secondary | ICD-10-CM

## 2022-08-28 DIAGNOSIS — N1832 Chronic kidney disease, stage 3b: Secondary | ICD-10-CM

## 2022-08-28 DIAGNOSIS — K219 Gastro-esophageal reflux disease without esophagitis: Secondary | ICD-10-CM

## 2022-08-28 LAB — CBC
HCT: 31.9 % — ABNORMAL LOW (ref 36.0–46.0)
Hemoglobin: 9.6 g/dL — ABNORMAL LOW (ref 12.0–15.0)
MCH: 28 pg (ref 26.0–34.0)
MCHC: 30.1 g/dL (ref 30.0–36.0)
MCV: 93 fL (ref 80.0–100.0)
Platelets: 136 10*3/uL — ABNORMAL LOW (ref 150–400)
RBC: 3.43 MIL/uL — ABNORMAL LOW (ref 3.87–5.11)
RDW: 12.6 % (ref 11.5–15.5)
WBC: 5.7 10*3/uL (ref 4.0–10.5)
nRBC: 0 % (ref 0.0–0.2)

## 2022-08-28 LAB — COMPREHENSIVE METABOLIC PANEL
ALT: 10 U/L (ref 0–44)
AST: 16 U/L (ref 15–41)
Albumin: 2.8 g/dL — ABNORMAL LOW (ref 3.5–5.0)
Alkaline Phosphatase: 83 U/L (ref 38–126)
Anion gap: 8 (ref 5–15)
BUN: 23 mg/dL (ref 8–23)
CO2: 27 mmol/L (ref 22–32)
Calcium: 8.2 mg/dL — ABNORMAL LOW (ref 8.9–10.3)
Chloride: 104 mmol/L (ref 98–111)
Creatinine, Ser: 1.06 mg/dL — ABNORMAL HIGH (ref 0.44–1.00)
GFR, Estimated: 51 mL/min — ABNORMAL LOW (ref >60)
Glucose, Bld: 112 mg/dL — ABNORMAL HIGH (ref 70–99)
Potassium: 4 mmol/L (ref 3.5–5.1)
Sodium: 139 mmol/L (ref 135–145)
Total Bilirubin: 0.4 mg/dL (ref 0.3–1.2)
Total Protein: 6.1 g/dL — ABNORMAL LOW (ref 6.5–8.1)

## 2022-08-28 LAB — MAGNESIUM: Magnesium: 2.1 mg/dL (ref 1.7–2.4)

## 2022-08-28 LAB — LACTIC ACID, PLASMA
Lactic Acid, Venous: 1.3 mmol/L (ref 0.5–1.9)
Lactic Acid, Venous: 1.1 mmol/L (ref 0.5–1.9)

## 2022-08-28 LAB — PHOSPHORUS: Phosphorus: 3.1 mg/dL (ref 2.5–4.6)

## 2022-08-28 LAB — MRSA NEXT GEN BY PCR, NASAL: MRSA by PCR Next Gen: NOT DETECTED

## 2022-08-28 MED ORDER — SODIUM CHLORIDE 0.9 % IV SOLN: INTRAVENOUS | Status: DC

## 2022-08-28 MED ORDER — FLUTICASONE FUROATE-VILANTEROL 100-25 MCG/ACT IN AEPB
1.0000 | INHALATION_SPRAY | Freq: Every morning | RESPIRATORY_TRACT | Status: DC
  Administered 2022-08-28 – 2022-08-30 (×3): 1 via RESPIRATORY_TRACT
  Filled 2022-08-28 (×2): qty 28

## 2022-08-28 MED ORDER — LORATADINE 10 MG PO TABS
10.0000 mg | ORAL_TABLET | Freq: Every day | ORAL | Status: DC
  Administered 2022-08-29 – 2022-08-30 (×2): 10 mg via ORAL
  Filled 2022-08-28 (×3): qty 1

## 2022-08-28 MED ORDER — ZOLPIDEM TARTRATE 5 MG PO TABS
5.0000 mg | ORAL_TABLET | Freq: Every day | ORAL | Status: DC
  Administered 2022-08-28 – 2022-08-29 (×2): 5 mg via ORAL
  Filled 2022-08-28 (×2): qty 1

## 2022-08-28 MED ORDER — ACETAMINOPHEN 650 MG RE SUPP: 650.0000 mg | Freq: Four times a day (QID) | RECTAL | Status: DC | PRN

## 2022-08-28 MED ORDER — HEPARIN SODIUM (PORCINE) 5000 UNIT/ML IJ SOLN
5000.0000 [IU] | Freq: Three times a day (TID) | INTRAMUSCULAR | Status: DC
  Administered 2022-08-28 – 2022-08-29 (×6): 5000 [IU] via SUBCUTANEOUS
  Filled 2022-08-28 (×7): qty 1

## 2022-08-28 MED ORDER — ALPRAZOLAM 0.5 MG PO TABS
0.5000 mg | ORAL_TABLET | Freq: Two times a day (BID) | ORAL | Status: DC
  Administered 2022-08-28 – 2022-08-30 (×5): 0.5 mg via ORAL
  Filled 2022-08-28 (×5): qty 1

## 2022-08-28 MED ORDER — IPRATROPIUM-ALBUTEROL 0.5-2.5 (3) MG/3ML IN SOLN: 3.0000 mL | Freq: Four times a day (QID) | RESPIRATORY_TRACT | Status: DC | PRN

## 2022-08-28 MED ORDER — ONDANSETRON HCL 4 MG PO TABS: 4.0000 mg | ORAL_TABLET | Freq: Four times a day (QID) | ORAL | Status: DC | PRN

## 2022-08-28 MED ORDER — CHLORHEXIDINE GLUCONATE CLOTH 2 % EX PADS
6.0000 | MEDICATED_PAD | Freq: Every day | CUTANEOUS | Status: DC
  Administered 2022-08-28: 6 via TOPICAL

## 2022-08-28 MED ORDER — MEMANTINE HCL 10 MG PO TABS
10.0000 mg | ORAL_TABLET | Freq: Two times a day (BID) | ORAL | Status: DC
  Administered 2022-08-28 – 2022-08-30 (×4): 10 mg via ORAL
  Filled 2022-08-28 (×4): qty 1

## 2022-08-28 MED ORDER — DONEPEZIL HCL 5 MG PO TABS
10.0000 mg | ORAL_TABLET | Freq: Every day | ORAL | Status: DC
  Administered 2022-08-28 – 2022-08-30 (×3): 10 mg via ORAL
  Filled 2022-08-28 (×3): qty 2

## 2022-08-28 MED ORDER — SIMVASTATIN 20 MG PO TABS
10.0000 mg | ORAL_TABLET | Freq: Every day | ORAL | Status: DC
  Administered 2022-08-28 – 2022-08-30 (×3): 10 mg via ORAL
  Filled 2022-08-28 (×3): qty 1

## 2022-08-28 MED ORDER — IPRATROPIUM-ALBUTEROL 0.5-2.5 (3) MG/3ML IN SOLN
3.0000 mL | Freq: Four times a day (QID) | RESPIRATORY_TRACT | Status: DC
  Administered 2022-08-28 – 2022-08-29 (×2): 3 mL via RESPIRATORY_TRACT
  Filled 2022-08-28 (×2): qty 3

## 2022-08-28 MED ORDER — POTASSIUM CHLORIDE CRYS ER 10 MEQ PO TBCR
10.0000 meq | EXTENDED_RELEASE_TABLET | Freq: Every day | ORAL | Status: DC
  Administered 2022-08-29 – 2022-08-30 (×2): 10 meq via ORAL
  Filled 2022-08-28 (×2): qty 1

## 2022-08-28 MED ORDER — VITAMIN C 500 MG PO TABS
500.0000 mg | ORAL_TABLET | Freq: Every day | ORAL | Status: DC
  Administered 2022-08-29 – 2022-08-30 (×2): 500 mg via ORAL
  Filled 2022-08-28 (×3): qty 1

## 2022-08-28 MED ORDER — POTASSIUM CHLORIDE CRYS ER 20 MEQ PO TBCR
40.0000 meq | EXTENDED_RELEASE_TABLET | Freq: Once | ORAL | Status: AC
  Administered 2022-08-28: 40 meq via ORAL
  Filled 2022-08-28: qty 2

## 2022-08-28 MED ORDER — TRAMADOL HCL 50 MG PO TABS
50.0000 mg | ORAL_TABLET | Freq: Four times a day (QID) | ORAL | Status: DC | PRN
  Administered 2022-08-28 – 2022-08-29 (×2): 50 mg via ORAL
  Filled 2022-08-28 (×3): qty 1

## 2022-08-28 MED ORDER — PANTOPRAZOLE SODIUM 40 MG PO TBEC
40.0000 mg | DELAYED_RELEASE_TABLET | Freq: Every day | ORAL | Status: DC
  Administered 2022-08-28 – 2022-08-30 (×3): 40 mg via ORAL
  Filled 2022-08-28 (×3): qty 1

## 2022-08-28 MED ORDER — ONDANSETRON HCL 4 MG/2ML IJ SOLN: 4.0000 mg | Freq: Four times a day (QID) | INTRAMUSCULAR | Status: DC | PRN

## 2022-08-28 MED ORDER — ACETAMINOPHEN 325 MG PO TABS
650.0000 mg | ORAL_TABLET | Freq: Four times a day (QID) | ORAL | Status: DC | PRN
  Administered 2022-08-28 – 2022-08-29 (×3): 650 mg via ORAL
  Filled 2022-08-28 (×3): qty 2

## 2022-08-28 NOTE — Progress Notes (Addendum)
PROGRESS NOTE   [name]  ZOX:[id number]    DOB: 1934-09-23    DOA: 08/27/2022  PCP: Lynnea Ferrier, MD   I have briefly reviewed patients previous medical records in [facility].  Chief Complaint  Patient presents with   Fall    Brief Narrative:  88 year old female, presented to ED on 8/22, brought in by ambulance from [facility] after witnessed fall after she slipped out of her wheelchair, denied hitting her head.  PMH of HTN, COPD, HFpEF, IBS, pulmonary fibrosis.  In the ED, she had x-rays of elbow and knees, cleared for DC back to NH but family insisted on patient having x-ray of the hips.  In the ED, patient noted to be hypotensive and mildly hypoxic.  She was also complaining of hip pain.  In the ED, BP 87/55, creatinine 1.34, lactate of 2.8, hemoglobin of 10.7.  Imaging without fractures or acute findings.  Admitted with diagnosis of fall at [facility], hypotension, hypoxia, hypokalemia, elevated lactate and elevated creatinine.   Assessment & Plan:  Principal Problem:   Hypotension Active Problems:   Chronic diastolic CHF (congestive heart failure) (HCC)   GERD (gastroesophageal reflux disease)   Generalized weakness   Mixed hyperlipidemia   Dehydration   Hypokalemia   Hip pain   Lactic acidosis   Hypotension: May be due to dehydration. Post IV fluids, blood pressures have normalized and in the 130/60 range. Continue gentle IV fluids for additional 24 hours and monitor closely.  Elevated lactate: Secondary to dehydration and mild renal insufficiency. Resolved after IV fluids  Generalized weakness: Secondary to dehydration, hypokalemia, mild renal insufficiency complicating very advanced age and overall frail physical health PT and OT evaluation  Fall at [facility]: Extensive imaging including CT head, chest x-ray, bilateral hips with pelvis, left knee, left elbow without acute findings. Symptomatic treatment of pain, minimize opioids.  PT and OT  evaluation  Hypokalemia: Replaced.  Magnesium 2.1.  Stage IIIa chronic kidney disease  (not 3B as indicated earlier in H&P) Baseline creatinine probably in the 1.1 range.  Presented with creatinine of 1.34.  Does not meet criteria for AKI. Creatinine has normalized to her baseline post IV hydration. Avoid hypotension and nephrotoxic's.  COPD No clinical bronchospasm. Continue DuoNebs and [employer] Per nursing report, was not on O2 PTA.  Wean off oxygen as tolerated for saturations greater than 90%.  Chronic diastolic CHF: Clinically euvolemic or even slightly on the dry side. TTE April 2024 showed LVEF of 55 to 60%  Dementia: Not fully oriented but no agitation. Continue Aricept and Xanax Delirium precautions  GERD Continue PPI  Mixed hyperlipidemia Continue Zocor  Cavitary soft tissue mass left side of neck Noted on CT head and reports concerning for malignancy Not sure given her age, frail physical health, dementia, multiple severe significant comorbidities, that she will be a candidate for aggressive evaluation or management.  To be discussed with family.  Anemia: May be anemia of chronic disease.  Looking at prior numbers, appears to be at baseline  Thrombocytopenia, mild Follow CBC in AM.  Hypoxia: Noted in EDP note.  However do not see a true desat value.  Also even if she had hypoxia, does not meet criteria for acute respiratory failure with hypoxia.   Discussed with RN, wean off oxygen as tolerated for saturations greater than 90%.  Body mass index is 23.92 kg/m.  Pressure Ulcer:  ACP Documents: DNR present DVT prophylaxis: heparin injection 5,000 Units Start: 08/28/22  0600 SCDs Start: 08/28/22 0423     Code Status: DNR:  Family Communication: None at bedside Disposition:  Status is: Inpatient Remains inpatient appropriate because: IVF.  Transfer to medical bed.  Possible DC to SNF in the next 1 to 2 days.     Consultants:     Procedures:      Antimicrobials:      Subjective:  Per nursing, no acute issues and to her appeared oriented.  However upon my visit, patient is alert, oriented to self and partly to place, "[place]" and states that she lives with her daughter and granddaughter, ambulates with a walker or moves around with a wheelchair, does not use home oxygen (from NH).  Denies pain or any other complaints.  Objective:   Vitals:   08/28/22 0658 08/28/22 0700 08/28/22 0721 08/28/22 0800  BP: 133/84 131/60  133/66  Pulse: 80 80 72 81  Resp: 15 13 16 12   Temp:   98.5 F (36.9 C)   TempSrc:   Axillary   SpO2: 100% 91% 100% 98%  Weight:      Height:        General exam: Elderly female, moderately built, frail and chronically ill looking lying comfortably in bed without distress. Respiratory system: Clear to auscultation. Respiratory effort normal. Cardiovascular system: S1 & S2 heard, RRR. No JVD, murmurs, rubs, gallops or clicks. No pedal edema.  Telemetry personally reviewed: Sinus rhythm. Gastrointestinal system: Abdomen is nondistended, soft and nontender. No organomegaly or masses felt. Normal bowel sounds heard. Central nervous system: Alert but probably only oriented to self and partly to place. No focal neurological deficits. Extremities: Symmetric 5 x 5 power. Skin: No rashes, lesions or ulcers Psychiatry: Judgement and insight impaired. Mood & affect appropriate.     Data Reviewed:   I have personally reviewed following labs and imaging studies   CBC: Recent Labs  Lab 08/26/22 1616 08/27/22 1930 08/28/22 0435  WBC 6.9 7.9 5.7  NEUTROABS 4.9 5.7  --   HGB 12.7 10.7* 9.6*  HCT 41.2 34.6* 31.9*  MCV 92.2 91.5 93.0  PLT 228 193 136*    Basic Metabolic Panel: Recent Labs  Lab 08/26/22 1616 08/27/22 1930 08/28/22 0435  NA 135 134* 139  K 4.9 3.2* 4.0  CL 95* 95* 104  CO2 28 26 27   GLUCOSE 87 168* 112*  BUN 32* 26* 23  CREATININE 1.34* 1.34* 1.06*  CALCIUM 8.6* 8.6* 8.2*  MG   --   --  2.1  PHOS  --   --  3.1    Liver Function Tests: Recent Labs  Lab 08/26/22 1616 08/27/22 1930 08/28/22 0435  AST 26 22 16   ALT 12 11 10   ALKPHOS 111 96 83  BILITOT 0.4 0.4 0.4  PROT 8.2* 7.1 6.1*  ALBUMIN 3.7 3.3* 2.8*    CBG: No results for input(s): "GLUCAP" in the last 168 hours.  Microbiology Studies:   Recent Results (from the past 240 hour(s))  Culture, blood (routine x 2)     Status: None (Preliminary result)   Collection Time: 08/27/22  7:30 PM   Specimen: BLOOD LEFT ARM  Result Value Ref Range Status   Specimen Description BLOOD LEFT ARM BLOOD  Final   Special Requests   Final    BOTTLES DRAWN AEROBIC AND ANAEROBIC Blood Culture adequate volume   Culture   Final    NO GROWTH < 12 HOURS Performed at [facility], [address] [place], [place] [address]  Report Status PENDING  Incomplete  Culture, blood (routine x 2)     Status: None (Preliminary result)   Collection Time: 08/27/22  7:40 PM   Specimen: BLOOD LEFT HAND  Result Value Ref Range Status   Specimen Description BLOOD LEFT HAND BLOOD  Final   Special Requests   Final    BOTTLES DRAWN AEROBIC ONLY Blood Culture adequate volume   Culture   Final    NO GROWTH < 12 HOURS Performed at [facility], [address], [place], [place] [address]    Report Status PENDING  Incomplete  MRSA Next Gen by PCR, Nasal     Status: None   Collection Time: 08/28/22  4:08 AM   Specimen: Nasal Mucosa; Nasal Swab  Result Value Ref Range Status   MRSA by PCR Next Gen NOT DETECTED NOT DETECTED Final    Comment: (NOTE) The GeneXpert MRSA Assay (FDA approved for NASAL specimens only), is one component of a comprehensive MRSA colonization surveillance program. It is not intended to diagnose MRSA infection nor to guide or monitor treatment for MRSA infections. Test performance is not FDA approved in patients less than 21 years old. Performed at [facility], [address], [place], [place] [address]      Radiology Studies:  CT Head Wo Contrast  Result Date: 08/27/2022 CLINICAL DATA:  Fall and trauma to the head. EXAM: CT HEAD WITHOUT CONTRAST TECHNIQUE: Contiguous axial images were obtained from the base of the skull through the vertex without intravenous contrast. RADIATION DOSE REDUCTION: This exam was performed according to the departmental dose-optimization program which includes automated exposure control, adjustment of the mA and/or kV according to patient size and/or use of iterative reconstruction technique. COMPARISON:  Head CT dated 08/10/2022. FINDINGS: Brain: Mild age-related atrophy and moderate chronic microvascular ischemic changes. There is no acute intracranial hemorrhage. No mass effect or midline shift. No extra-axial fluid collection. Vascular: No hyperdense vessel or unexpected calcification. Skull: Normal. Negative for fracture or focal lesion. Sinuses/Orbits: Mild mucoperiosteal thickening of paranasal sinuses. No air-fluid level. The left mastoid air cells are clear. Right mastoid effusions. Other: Partially visualized 2.3 x 5.4 cm mass with soft tissue air in the left side of the neck, possibly a cavitary lesion and concerning for malignancy. Clinical correlation is recommended. IMPRESSION: 1. No acute intracranial pathology. 2. Mild age-related atrophy and moderate chronic microvascular ischemic changes. 3. Partially visualized cavitary soft tissue mass in the left side of the neck concerning for malignancy. Correlation with clinical exam recommended. Electronically Signed   By: Elgie Collard M.D.   On: 08/27/2022 21:23   DG Chest Portable 1 View  Result Date: 08/27/2022 CLINICAL DATA:  Weakness, fall EXAM: PORTABLE CHEST 1 VIEW COMPARISON:  08/10/2022 FINDINGS: There is hyperinflation of the lungs compatible with COPD. Heart is normal size. Tortuous, ectatic aorta with diffuse atherosclerosis. Diffuse interstitial prominence throughout the lungs, stable since prior study.  This likely reflects chronic lung disease. Left base atelectasis or scarring. No effusions. No acute bony abnormality. IMPRESSION: COPD/chronic changes. Left base atelectasis or scarring. No active disease. Electronically Signed   By: Charlett Nose M.D.   On: 08/27/2022 19:44   DG Hips Bilat W or Wo Pelvis 5 Views  Result Date: 08/27/2022 CLINICAL DATA:  Weakness, fall EXAM: DG HIP (WITH OR WITHOUT PELVIS) 5+V BILAT COMPARISON:  12/22/2021 FINDINGS: Prior left hip replacement. 3 screws are seen within the right proximal femur related to remote injury. No acute fracture, subluxation or dislocation. IMPRESSION: Postoperative  changes bilaterally as above. No acute bony abnormality. Electronically Signed   By: Charlett Nose M.D.   On: 08/27/2022 19:42   DG Knee Complete 4 Views Left  Result Date: 08/26/2022 CLINICAL DATA:  fall EXAM: LEFT KNEE - COMPLETE 4+ VIEW COMPARISON:  None Available. FINDINGS: There is diffuse osteopenia of the visualized osseous structures. No acute fracture or dislocation. No aggressive osseous lesion. There are degenerative changes of the knee joint in the form of mildly reduced tibio-femoral compartment joint space, tibial spiking and osteophytosis. No knee effusion or focal soft tissue swelling. No radiopaque foreign bodies. IMPRESSION: No acute fracture or dislocation. Mild degenerative changes of the knee. Electronically Signed   By: Jules Schick M.D.   On: 08/26/2022 16:29   DG Elbow Complete Left  Result Date: 08/26/2022 CLINICAL DATA:  fall EXAM: LEFT ELBOW - COMPLETE 3+ VIEW COMPARISON:  None Available. FINDINGS: There is diffuse osteopenia of the visualized osseous structures. No acute fracture or dislocation. No aggressive osseous lesion. Mild degenerative arthritis of elbow joint. No radiopaque foreign bodies. Soft tissues are within normal limits. IMPRESSION: *No acute fracture or dislocation. Electronically Signed   By: Jules Schick M.D.   On: 08/26/2022 16:27     Scheduled Meds:    ALPRAZolam  0.5 mg Oral BID   Chlorhexidine Gluconate Cloth  6 each Topical Q0600   donepezil  10 mg Oral Daily   fluticasone furoate-vilanterol  1 puff Inhalation q morning   heparin  5,000 Units Subcutaneous Q8H   pantoprazole  40 mg Oral Daily   simvastatin  10 mg Oral Daily    Continuous Infusions:    sodium chloride 50 mL/hr at 08/28/22 0813     LOS: 0 days     Marcellus Scott, MD,  FACP, [facility], [facility], [facility], [facility]   [facility]      To contact the attending provider between 7A-7P or the covering provider during after hours 7P-7A, please log into the web site [email or web address] and access using universal [place] password for that web site. If you do not have the password, please call the hospital operator.  08/28/2022, 8:31 AM

## 2022-08-28 NOTE — Plan of Care (Signed)

## 2022-08-28 NOTE — Evaluation (Signed)
Physical Therapy Evaluation Patient Details Name: [name] MRN: [id number] DOB: 03/20/1934 Today's Date: 08/28/2022  History of Present Illness  [name] is a 88 y.o. female with medical history significant of hypertension, COPD, HFpEF, IBS, pulmonary fibrosis who presents to the emergency department due to a fall.  Patient was seen in the ED 2 days ago x-ray of elbow and knee were done and she had a good range of motion of the hips, so she was discharged home.  Family called to insist patient have her hips x-rayed.  On arrival to the ED, she was noted to be hypotensive.  Patient denies fever, chills, dizziness, lightheadedness, chest pain.  Clinical Impression  Pt states she wants to go home and not back to the nursing home.  No family members present but normally lives with multiple family members.  Pt needs supervision for safety and would benefit from [facility].         If plan is discharge home, recommend the following: A little help with walking and/or transfers;A little help with bathing/dressing/bathroom;Assist for transportation;Assistance with cooking/housework   Can travel by private vehicle    yes    Equipment Recommendations None recommended by PT  Recommendations for Other Services    none   Functional Status Assessment       Precautions / Restrictions Precautions Precautions: Fall Restrictions Weight Bearing Restrictions: No      Mobility  Bed Mobility Overal bed mobility: Needs Assistance Bed Mobility: Supine to Sit, Sit to Supine     Supine to sit: Contact guard Sit to supine: Contact guard assist        Transfers   Equipment used: Rolling walker (2 wheels)                    Ambulation/Gait Ambulation/Gait assistance: Contact guard assist Gait Distance (Feet): 85 Feet Assistive device: Rolling walker (2 wheels) Gait Pattern/deviations: Decreased step length - left, Decreased stance time - left                Pertinent  Vitals/Pain Pain Assessment Pain Assessment: No/denies pain    Home Living Family/patient expects to be discharged to:: Private residence Living Arrangements: Children Available Help at Discharge: Personal care attendant;Family;Available 24 hours/day Type of Home: House Home Access: Ramped entrance       Home Layout: One level Home Equipment: Shower seat;Hand held shower head;Grab bars - tub/shower;Grab bars - toilet;Rolling Walker (2 wheels);Hospital bed;Wheelchair - manual;BSC/3in1 Additional Comments: Pt reported no change in living history.    Prior Function Prior Level of Function : Needs assist       Physical Assist : ADLs (physical);Mobility (physical)   ADLs (physical): Feeding;Grooming;Bathing;Dressing;Toileting;IADLs Mobility Comments: RW and aide present if needed ADLs Comments: had home aides 8 hours/day x 5 days/week, Assist with ADL's and IADL's.     Extremity/Trunk Assessment        Lower Extremity Assessment Lower Extremity Assessment: Generalized weakness       Communication   Communication Communication: No apparent difficulties  Cognition Arousal: Alert   Overall Cognitive Status: Within Functional Limits for tasks assessed                                             Exercises     Assessment/Plan    PT Assessment Patient needs continued PT services  PT Problem List  Decreased activity tolerance;Decreased balance;Decreased strength       PT Treatment Interventions Functional mobility training;Therapeutic exercise;Gait training    PT Goals (Current goals can be found in the Care Plan section)       Frequency Min 3X/week        AM-PAC PT "6 Clicks" Mobility  Outcome Measure Help needed turning from your back to your side while in a flat bed without using bedrails?: None Help needed moving from lying on your back to sitting on the side of a flat bed without using bedrails?: A Little Help needed moving to and from a  bed to a chair (including a wheelchair)?: A Little Help needed standing up from a chair using your arms (e.g., wheelchair or bedside chair)?: A Little Help needed to walk in hospital room?: A Little Help needed climbing 3-5 steps with a railing? : A Little 6 Click Score: 19    End of Session Equipment Utilized During Treatment: Gait belt Activity Tolerance: Patient tolerated treatment well Patient left: in chair Nurse Communication: Mobility status PT Visit Diagnosis: Muscle weakness (generalized) (M62.81)    Time: 1610-9604 PT Time Calculation (min) (ACUTE ONLY): 22 min   Charges:   PT Evaluation $PT Eval Low Complexity: 1 Low   PT General Charges $$ ACUTE PT VISIT: 1 Visit         08/28/2022, 11:51 AM

## 2022-08-28 NOTE — H&P (Signed)
History and Physical    Patient: [name]:[id number] DOB: 1934/05/17 DOA: 08/27/2022 DOS: the patient was seen and examined on 08/28/2022 PCP: Lacey Ferrier, MD  Patient coming from: Home  Chief Complaint:  Chief Complaint  Patient presents with   Fall   HPI: [name] is a 88 y.o. female with medical history significant of hypertension, COPD, HFpEF, IBS, pulmonary fibrosis who presents to the emergency department due to a fall.  Patient was seen in the ED 2 days ago x-ray of elbow and knee were done and she had a good range of motion of the hips, so she was discharged home.  Family called to insist patient have her hips x-rayed.  On arrival to the ED, she was noted to be hypotensive.  Patient denies fever, chills, dizziness, lightheadedness, chest pain.  ED Course:  In the emergency department, BP was 87/55 and other vital signs were within normal range.  Patient was placed on supplemental oxygen via [place] at 2 LPM to maintain O2 sat of 94%.  Workup in the ED showed normocytic anemia, BMP showed sodium 134, potassium 3.2, chloride 95, bicarb 26, blood glucose 168, BUN 26, creatinine 1.34 (baseline creatinine at 1.0-1.1), albumin 3.3, lactic acid 2.8 > 2.2.  Blood culture pending. Bilateral hip x-ray showed postoperative changes bilaterally and no acute bony abnormality Chest x-ray showed left base atelectasis or scarring with no active disease CT head without contrast showed no acute intracranial pathology IV hydration was provided.  Hospitalist was asked to admit patient for further evaluation and management.  Review of Systems: Review of systems as noted in the HPI. All other systems reviewed and are negative.   Past Medical History:  Diagnosis Date   Anxiety    Chronic diastolic (congestive) heart failure (HCC)    Chronic obstructive pulmonary disease, unspecified (HCC)    COPD (chronic obstructive pulmonary disease) (HCC)    GERD (gastroesophageal reflux disease)     Hyperlipidemia    IBS (irritable bowel syndrome)    Lumbago with sciatica    Osteoporosis    Overactive bladder    Pneumonia    Pulmonary fibrosis (HCC)    Past Surgical History:  Procedure Laterality Date   COLONOSCOPY  10/04/2009   sigmoid diverticula, multiple tubulovillous adneomas, needs surveillance Oct 2013   ESOPHAGOGASTRODUODENOSCOPY  04/16/2011   Dr. Jena Gauss: Noncritical Schatzkis ring( not manipulated because no dysphagia). Normal esophagus otherwise  large hiatal hernia. Gastric polyp-status post biopsy. Gastric erosions-staus post biopsy. Abnormal bulb-status post biopsy. benign small bowel, stomach biopsy with ulcerated gastric antral mucosa with foveolar hyperplasia and surface erosion, polyp with inflamed gastric antral mucosa    ESOPHAGOGASTRODUODENOSCOPY N/A 04/29/2014   Dr. Jena Gauss: Noncritical Schatzki's ring large hiatal hernia. Retained gastric contents.GES with slight delay   femur fracture Left 2023   HIP ARTHROPLASTY Left 09/24/2021   Procedure: ARTHROPLASTY BIPOLAR HIP (HEMIARTHROPLASTY);  Surgeon: Oliver Barre, MD;  Location: [facility];  Service: Orthopedics;  Laterality: Left;   HIP PINNING,CANNULATED Right 04/06/2018   Procedure: CANNULATED HIP PINNING;  Surgeon: Vickki Hearing, MD;  Location: [facility];  Service: Orthopedics;  Laterality: Right;    Social History:  reports that she has quit smoking. Her smoking use included cigarettes. She has been exposed to tobacco smoke. Her smokeless tobacco use includes snuff. She reports that she does not drink alcohol and does not use drugs.   Allergies  Allergen Reactions   Codeine Anaphylaxis, Shortness Of Breath and Rash  Neurontin [Gabapentin] Anaphylaxis   Sulfonamide Derivatives Hives and Shortness Of Breath   Aloe Other (See Comments)    Unknown reaction   Levaquin [Levofloxacin] Other (See Comments)    Pt admitted to hospital with lung problems due to this medication.   Morphine     Patient's daughter  reports hypoxia requiring O2 with morphine and similar opiates.  But does okay with tramadol.   Septra [Sulfamethoxazole-Trimethoprim] Hives   Sulfa Antibiotics Hives   Trimethoprim Hives   Vibra-Tab [Doxycycline] Hives    Family History  Problem Relation Age of Onset   Stroke Mother    Diabetes Mother    Heart disease Father    Colon cancer Neg Hx      Prior to Admission medications   Medication Sig Start Date End Date Taking? Authorizing Provider  acetaminophen (TYLENOL) 500 MG tablet Take 1 tablet (500 mg total) by mouth every 8 (eight) hours as needed. 08/26/22   Benjiman Core, MD  albuterol (PROAIR HFA) 108 (90 Base) MCG/ACT inhaler Inhale 2 puffs into the lungs every 4 (four) hours as needed for wheezing or shortness of breath. Patient taking differently: Inhale 2 puffs into the lungs every 6 (six) hours as needed for wheezing or shortness of breath. 06/12/21   Johnson, Clanford L, MD  ALPRAZolam Prudy Feeler) 0.5 MG tablet Take 1 tablet (0.5 mg total) by mouth 2 (two) times daily. 07/29/22   Catarina Hartshorn, MD  ascorbic acid (VITAMIN C) 500 MG tablet Take 500 mg by mouth daily.    [provider]  diclofenac Sodium (VOLTAREN) 1 % GEL APPLY AS DIRECTED TO THE AFFECTED KNEE FOUR TIMES DAILY. Patient taking differently: Apply 1 Application topically 4 (four) times daily as needed (pain). 08/04/20   Vickki Hearing, MD  donepezil (ARICEPT) 10 MG tablet Take 10 mg by mouth daily. 07/25/19   [provider]  fluticasone furoate-vilanterol (BREO ELLIPTA) 100-25 MCG/ACT AEPB Inhale 1 puff into the lungs every morning.    [provider]  furosemide (LASIX) 40 MG tablet Take 40 mg by mouth daily.    [provider]  ipratropium-albuterol (DUONEB) 0.5-2.5 (3) MG/3ML SOLN Take 3 mLs by nebulization in the morning, at noon, in the evening, and at bedtime.    [provider]  lidocaine (LIDODERM) 5 % Place 1 patch onto the skin daily as needed. Remove &  Discard patch within 12 hours or as directed by MD 07/29/22   Catarina Hartshorn, MD  LINZESS 145 MCG CAPS capsule Take 145 mcg by mouth daily as needed (bowel movement). 05/22/21   [provider]  loratadine (CLARITIN) 10 MG tablet Take 10 mg by mouth daily. 08/29/21   [provider]  memantine (NAMENDA) 10 MG tablet Take 10 mg by mouth 2 (two) times daily.    [provider]  Multiple Vitamins-Minerals (ONE A DAY WOMEN 50 PLUS PO) Take 1 tablet by mouth daily.    [provider]  pantoprazole (PROTONIX) 40 MG tablet TAKE (1) TABLET BY MOUTH TWICE A DAY WITH MEALS (BREAKFAST AND SUPPER) 05/24/19   Anice Paganini, NP  simvastatin (ZOCOR) 10 MG tablet Take 10 mg by mouth daily. 02/27/21   [provider]  traMADol (ULTRAM) 50 MG tablet Take 1 tablet (50 mg total) by mouth every 6 (six) hours as needed for moderate pain or severe pain. 07/29/22   Catarina Hartshorn, MD  zolpidem (AMBIEN) 5 MG tablet Take 1 tablet (5 mg total) by mouth at bedtime. 07/29/22  Catarina Hartshorn, MD    Physical Exam: BP 112/60   Pulse 73   Temp 98.8 F (37.1 C) (Oral)   Resp 18   Ht 4\' 8"  (1.422 m)   Wt 49 kg   SpO2 98%   BMI 24.22 kg/m   General: 88 y.o. year-old female well developed well nourished in no acute distress.  Alert and oriented x3. HEENT: NCAT, EOMI, noted cyst in posterior part of left neck, dry mucous membrane Neck: Supple, trachea medial Cardiovascular: Regular rate and rhythm with no rubs or gallops.  No thyromegaly or JVD noted.  No lower extremity edema. 2/4 pulses in all 4 extremities. Respiratory: Clear to auscultation with no wheezes or rales. Good inspiratory effort. Abdomen: Soft, nontender nondistended with normal bowel sounds x4 quadrants. Muskuloskeletal: Mild tenderness on palpation of left hip.  No cyanosis or clubbing noted bilaterally Neuro: CN II-XII intact, strength 5/5 x 4, sensation, reflexes intact Skin: No ulcerative lesions noted or rashes Psychiatry:  Judgement and insight appear normal. Mood is appropriate for condition and setting          Labs on Admission:  Basic Metabolic Panel: Recent Labs  Lab 08/26/22 1616 08/27/22 1930  NA 135 134*  K 4.9 3.2*  CL 95* 95*  CO2 28 26  GLUCOSE 87 168*  BUN 32* 26*  CREATININE 1.34* 1.34*  CALCIUM 8.6* 8.6*   Liver Function Tests: Recent Labs  Lab 08/26/22 1616 08/27/22 1930  AST 26 22  ALT 12 11  ALKPHOS 111 96  BILITOT 0.4 0.4  PROT 8.2* 7.1  ALBUMIN 3.7 3.3*   No results for input(s): "LIPASE", "AMYLASE" in the last 168 hours. No results for input(s): "AMMONIA" in the last 168 hours. CBC: Recent Labs  Lab 08/26/22 1616 08/27/22 1930  WBC 6.9 7.9  NEUTROABS 4.9 5.7  HGB 12.7 10.7*  HCT 41.2 34.6*  MCV 92.2 91.5  PLT 228 193   Cardiac Enzymes: No results for input(s): "CKTOTAL", "CKMB", "CKMBINDEX", "TROPONINI" in the last 168 hours.  BNP (last 3 results) Recent Labs    02/28/22 2039 04/23/22 1038  BNP 90.0 225.0*    ProBNP (last 3 results) No results for input(s): "PROBNP" in the last 8760 hours.  CBG: No results for input(s): "GLUCAP" in the last 168 hours.  Radiological Exams on Admission: CT Head Wo Contrast  Result Date: 08/27/2022 CLINICAL DATA:  Fall and trauma to the head. EXAM: CT HEAD WITHOUT CONTRAST TECHNIQUE: Contiguous axial images were obtained from the base of the skull through the vertex without intravenous contrast. RADIATION DOSE REDUCTION: This exam was performed according to the departmental dose-optimization program which includes automated exposure control, adjustment of the mA and/or kV according to patient size and/or use of iterative reconstruction technique. COMPARISON:  Head CT dated 08/10/2022. FINDINGS: Brain: Mild age-related atrophy and moderate chronic microvascular ischemic changes. There is no acute intracranial hemorrhage. No mass effect or midline shift. No extra-axial fluid collection. Vascular: No hyperdense vessel or  unexpected calcification. Skull: Normal. Negative for fracture or focal lesion. Sinuses/Orbits: Mild mucoperiosteal thickening of paranasal sinuses. No air-fluid level. The left mastoid air cells are clear. Right mastoid effusions. Other: Partially visualized 2.3 x 5.4 cm mass with soft tissue air in the left side of the neck, possibly a cavitary lesion and concerning for malignancy. Clinical correlation is recommended. IMPRESSION: 1. No acute intracranial pathology. 2. Mild age-related atrophy and moderate chronic microvascular ischemic changes. 3. Partially visualized cavitary soft tissue mass in the left side  of the neck concerning for malignancy. Correlation with clinical exam recommended. Electronically Signed   By: Elgie Collard M.D.   On: 08/27/2022 21:23   DG Chest Portable 1 View  Result Date: 08/27/2022 CLINICAL DATA:  Weakness, fall EXAM: PORTABLE CHEST 1 VIEW COMPARISON:  08/10/2022 FINDINGS: There is hyperinflation of the lungs compatible with COPD. Heart is normal size. Tortuous, ectatic aorta with diffuse atherosclerosis. Diffuse interstitial prominence throughout the lungs, stable since prior study. This likely reflects chronic lung disease. Left base atelectasis or scarring. No effusions. No acute bony abnormality. IMPRESSION: COPD/chronic changes. Left base atelectasis or scarring. No active disease. Electronically Signed   By: Charlett Nose M.D.   On: 08/27/2022 19:44   DG Hips Bilat W or Wo Pelvis 5 Views  Result Date: 08/27/2022 CLINICAL DATA:  Weakness, fall EXAM: DG HIP (WITH OR WITHOUT PELVIS) 5+V BILAT COMPARISON:  12/22/2021 FINDINGS: Prior left hip replacement. 3 screws are seen within the right proximal femur related to remote injury. No acute fracture, subluxation or dislocation. IMPRESSION: Postoperative changes bilaterally as above. No acute bony abnormality. Electronically Signed   By: Charlett Nose M.D.   On: 08/27/2022 19:42   DG Knee Complete 4 Views Left  Result Date:  08/26/2022 CLINICAL DATA:  fall EXAM: LEFT KNEE - COMPLETE 4+ VIEW COMPARISON:  None Available. FINDINGS: There is diffuse osteopenia of the visualized osseous structures. No acute fracture or dislocation. No aggressive osseous lesion. There are degenerative changes of the knee joint in the form of mildly reduced tibio-femoral compartment joint space, tibial spiking and osteophytosis. No knee effusion or focal soft tissue swelling. No radiopaque foreign bodies. IMPRESSION: No acute fracture or dislocation. Mild degenerative changes of the knee. Electronically Signed   By: Jules Schick M.D.   On: 08/26/2022 16:29   DG Elbow Complete Left  Result Date: 08/26/2022 CLINICAL DATA:  fall EXAM: LEFT ELBOW - COMPLETE 3+ VIEW COMPARISON:  None Available. FINDINGS: There is diffuse osteopenia of the visualized osseous structures. No acute fracture or dislocation. No aggressive osseous lesion. Mild degenerative arthritis of elbow joint. No radiopaque foreign bodies. Soft tissues are within normal limits. IMPRESSION: *No acute fracture or dislocation. Electronically Signed   By: Jules Schick M.D.   On: 08/26/2022 16:27    EKG: I independently viewed the EKG done and my findings are as followed: EKG was not done in the ED  Assessment/Plan Present on Admission:  Hypotension  Chronic diastolic CHF (congestive heart failure) (HCC)  GERD (gastroesophageal reflux disease)  Mixed hyperlipidemia  Principal Problem:   Hypotension Active Problems:   Chronic diastolic CHF (congestive heart failure) (HCC)   GERD (gastroesophageal reflux disease)   Generalized weakness   Mixed hyperlipidemia   Dehydration   Hypokalemia   Hip pain   Lactic acidosis  Hypotension Continue IV hydration  Generalized weakness possibly due to dehydration and hypokalemia Continue fall precaution Continue PT/OT eval and treat  Hypokalemia K+ 3.2, this will be replenished  Hip pain Continue Tylenol as needed  Lactic  acidosis Lactic acid 2.8 > 2.2, continue to trend lactic acid Continue IV hydration  CKD 3B Dehydration Continue IV hydration Renally adjust medications, avoid nephrotoxic agents/dehydration/hypotension  COPD Continue home DuoNebs and Breo Ellipta  Chronic diastolic CHF Stable; echocardiogram done in April 2024 showed LVEF of 55 to 60%.  No RWMA, G1 DD  Dementia Continue Aricept and Xanax  GERD Continue Protonix  Mixed hyperlipidemia Continue Zocor   DVT prophylaxis: Heparin subcu   Advance Care  Planning:   Code Status: DNR   Consults: None  Family Communication: None at bedside  Severity of Illness: The appropriate patient status for this patient is INPATIENT. Inpatient status is judged to be reasonable and necessary in order to provide the required intensity of service to ensure the patient's safety. The patient's presenting symptoms, physical exam findings, and initial radiographic and laboratory data in the context of their chronic comorbidities is felt to place them at high risk for further clinical deterioration. Furthermore, it is not anticipated that the patient will be medically stable for discharge from the hospital within 2 midnights of admission.   * I certify that at the point of admission it is my clinical judgment that the patient will require inpatient hospital care spanning beyond 2 midnights from the point of admission due to high intensity of service, high risk for further deterioration and high frequency of surveillance required.*  Author: Frankey Shown, DO 08/28/2022 4:32 AM  For on call review [email or web address].

## 2022-08-28 NOTE — ED Provider Notes (Signed)
Plan at signout was to have patient get IV fluids and reassess.  Blood pressures are improving. Discussed with Dr. Thomes Dinning for admission Patient will go to stepdown   Zadie Rhine, MD 08/28/22 [phone]

## 2022-08-28 NOTE — ED Notes (Signed)
ED TO INPATIENT HANDOFF REPORT  ED Nurse Name and Phone #: Radonna Ricker Name/Age/Gender [name] 88 y.o. female Room/Bed: APA05/APA05  Code Status   Code Status: Prior  Home/SNF/Other Nursing Home Patient oriented to: self, place, time, and situation Is this baseline? Yes   Triage Complete: Triage complete  Chief Complaint Hypotension [I95.9]  Triage Note Pt had fall yesterday and was seen at [facility]. Pt was cleared for discharge last night. PT family called out to insist pt have her hips xrayed. Pt is complaining of pain of a 5/10   Allergies Allergies  Allergen Reactions   Codeine Anaphylaxis, Shortness Of Breath and Rash   Neurontin [Gabapentin] Anaphylaxis   Sulfonamide Derivatives Hives and Shortness Of Breath   Aloe Other (See Comments)    Unknown reaction   Levaquin [Levofloxacin] Other (See Comments)    Pt admitted to hospital with lung problems due to this medication.   Morphine     Patient's daughter reports hypoxia requiring O2 with morphine and similar opiates.  But does okay with tramadol.   Septra [Sulfamethoxazole-Trimethoprim] Hives   Sulfa Antibiotics Hives   Trimethoprim Hives   Vibra-Tab [Doxycycline] Hives    Level of Care/Admitting Diagnosis ED Disposition     ED Disposition  Admit   Condition  --   Comment  Hospital Area: [facility] [[id number]]  Level of Care: Stepdown [14]  Covid Evaluation: Asymptomatic - no recent exposure (last 10 days) testing not required  Diagnosis: Hypotension [161096]  Admitting Physician: Frankey Shown [[id number]]  Attending Physician: Frankey Shown [[id number]]  Certification:: I certify this patient will need inpatient services for at least 2 midnights  Expected Medical Readiness: 08/29/2022          B Medical/Surgery History Past Medical History:  Diagnosis Date   Anxiety    Chronic diastolic (congestive) heart failure (HCC)    Chronic obstructive pulmonary disease, unspecified (HCC)     COPD (chronic obstructive pulmonary disease) (HCC)    GERD (gastroesophageal reflux disease)    Hyperlipidemia    IBS (irritable bowel syndrome)    Lumbago with sciatica    Osteoporosis    Overactive bladder    Pneumonia    Pulmonary fibrosis (HCC)    Past Surgical History:  Procedure Laterality Date   COLONOSCOPY  10/04/2009   sigmoid diverticula, multiple tubulovillous adneomas, needs surveillance Oct 2013   ESOPHAGOGASTRODUODENOSCOPY  04/16/2011   Dr. Jena Gauss: Noncritical Schatzkis ring( not manipulated because no dysphagia). Normal esophagus otherwise  large hiatal hernia. Gastric polyp-status post biopsy. Gastric erosions-staus post biopsy. Abnormal bulb-status post biopsy. benign small bowel, stomach biopsy with ulcerated gastric antral mucosa with foveolar hyperplasia and surface erosion, polyp with inflamed gastric antral mucosa    ESOPHAGOGASTRODUODENOSCOPY N/A 04/29/2014   Dr. Jena Gauss: Noncritical Schatzki's ring large hiatal hernia. Retained gastric contents.GES with slight delay   femur fracture Left 2023   HIP ARTHROPLASTY Left 09/24/2021   Procedure: ARTHROPLASTY BIPOLAR HIP (HEMIARTHROPLASTY);  Surgeon: Oliver Barre, MD;  Location: [facility];  Service: Orthopedics;  Laterality: Left;   HIP PINNING,CANNULATED Right 04/06/2018   Procedure: CANNULATED HIP PINNING;  Surgeon: Vickki Hearing, MD;  Location: [facility];  Service: Orthopedics;  Laterality: Right;     A IV Location/Drains/Wounds Patient Lines/Drains/Airways Status     Active Line/Drains/Airways     Name Placement date Placement time Site Days   Peripheral IV 08/26/22 20 G Right Antecubital 08/26/22  1619  Antecubital  2   Pressure Injury  07/25/22 Toe (Comment  which one) Anterior;Right Stage 2 -  Partial thickness loss of dermis presenting as a shallow open injury with a red, pink wound bed without slough. dry healing wound.  treated by wound nurse at home  fourth toe med 07/25/22  1830  -- 34   Pressure Injury  07/25/22 Toe (Comment  which one) Right Stage 2 -  Partial thickness loss of dermis presenting as a shallow open injury with a red, pink wound bed without slough. dry small healing wound , fifth toe medial surface, treated at home by woun 07/25/22  1830  -- 34   Pressure Injury 07/25/22 Toe (Comment  which one) Left Stage 2 -  Partial thickness loss of dermis presenting as a shallow open injury with a red, pink wound bed without slough. dry healing wound, 0.5 by 0.5 cm   treated at home by wound nurse 07/25/22  1830  -- 34            Intake/Output Last 24 hours  Intake/Output Summary (Last 24 hours) at 08/28/2022 0238 Last data filed at 08/28/2022 0127 Gross per 24 hour  Intake 1500.4 ml  Output --  Net 1500.4 ml    Labs/Imaging Results for orders placed or performed during the hospital encounter of 08/27/22 (from the past 48 hour(s))  Comprehensive metabolic panel     Status: Abnormal   Collection Time: 08/27/22  7:30 PM  Result Value Ref Range   Sodium 134 (L) 135 - 145 mmol/L   Potassium 3.2 (L) 3.5 - 5.1 mmol/L   Chloride 95 (L) 98 - 111 mmol/L   CO2 26 22 - 32 mmol/L   Glucose, Bld 168 (H) 70 - 99 mg/dL    Comment: Glucose reference range applies only to samples taken after fasting for at least 8 hours.   BUN 26 (H) 8 - 23 mg/dL   Creatinine, Ser 7.82 (H) 0.44 - 1.00 mg/dL   Calcium 8.6 (L) 8.9 - 10.3 mg/dL   Total Protein 7.1 6.5 - 8.1 g/dL   Albumin 3.3 (L) 3.5 - 5.0 g/dL   AST 22 15 - 41 U/L   ALT 11 0 - 44 U/L   Alkaline Phosphatase 96 38 - 126 U/L   Total Bilirubin 0.4 0.3 - 1.2 mg/dL   GFR, Estimated 39 (L) >60 mL/min    Comment: (NOTE) Calculated using the CKD-EPI Creatinine Equation (2021)    Anion gap 13 5 - 15    Comment: Performed at [facility], [address] [place], [place] [address]  CBC with Differential     Status: Abnormal   Collection Time: 08/27/22  7:30 PM  Result Value Ref Range   WBC 7.9 4.0 - 10.5 K/uL   RBC 3.78 (L) 3.87 - 5.11 MIL/uL    Hemoglobin 10.7 (L) 12.0 - 15.0 g/dL   HCT 30.8 (L) 65.7 - 84.6 %   MCV 91.5 80.0 - 100.0 fL   MCH 28.3 26.0 - 34.0 pg   MCHC 30.9 30.0 - 36.0 g/dL   RDW 96.2 95.2 - 84.1 %   Platelets 193 150 - 400 K/uL   nRBC 0.0 0.0 - 0.2 %   Neutrophils Relative % 72 %   Neutro Abs 5.7 1.7 - 7.7 K/uL   Lymphocytes Relative 13 %   Lymphs Abs 1.0 0.7 - 4.0 K/uL   Monocytes Relative 12 %   Monocytes Absolute 1.0 0.1 - 1.0 K/uL   Eosinophils Relative 3 %   Eosinophils Absolute  0.2 0.0 - 0.5 K/uL   Basophils Relative 0 %   Basophils Absolute 0.0 0.0 - 0.1 K/uL   Immature Granulocytes 0 %   Abs Immature Granulocytes 0.02 0.00 - 0.07 K/uL    Comment: Performed at [facility], [address], [place], [place] [address]  Culture, blood (routine x 2)     Status: None (Preliminary result)   Collection Time: 08/27/22  7:30 PM   Specimen: BLOOD LEFT ARM  Result Value Ref Range   Specimen Description BLOOD LEFT ARM BLOOD    Special Requests      BOTTLES DRAWN AEROBIC AND ANAEROBIC Blood Culture adequate volume Performed at [facility], [address], [place], [place] [address]    Culture PENDING    Report Status PENDING   Lactic acid, plasma     Status: Abnormal   Collection Time: 08/27/22  7:40 PM  Result Value Ref Range   Lactic Acid, Venous 2.8 (HH) 0.5 - 1.9 mmol/L    Comment: CRITICAL RESULT CALLED TO, READ BACK BY AND VERIFIED WITH OLDLAND,B ON 08/27/22 AT 2020 BY LOY,C Performed at [facility], [address], [place], [place] [address]   Culture, blood (routine x 2)     Status: None (Preliminary result)   Collection Time: 08/27/22  7:40 PM   Specimen: BLOOD LEFT HAND  Result Value Ref Range   Specimen Description BLOOD LEFT HAND BLOOD    Special Requests      BOTTLES DRAWN AEROBIC ONLY Blood Culture adequate volume Performed at [facility], [address], [place], [place] [address]    Culture PENDING    Report Status PENDING   Lactic acid, plasma     Status: Abnormal   Collection  Time: 08/27/22  9:11 PM  Result Value Ref Range   Lactic Acid, Venous 2.2 (HH) 0.5 - 1.9 mmol/L    Comment: CRITICAL RESULT CALLED TO, READ BACK BY AND VERIFIED WITH Kandis Ban 2130 08/27/22 NN Performed at [facility], [address], [place], [place] [address]    CT Head Wo Contrast  Result Date: 08/27/2022 CLINICAL DATA:  Fall and trauma to the head. EXAM: CT HEAD WITHOUT CONTRAST TECHNIQUE: Contiguous axial images were obtained from the base of the skull through the vertex without intravenous contrast. RADIATION DOSE REDUCTION: This exam was performed according to the departmental dose-optimization program which includes automated exposure control, adjustment of the mA and/or kV according to patient size and/or use of iterative reconstruction technique. COMPARISON:  Head CT dated 08/10/2022. FINDINGS: Brain: Mild age-related atrophy and moderate chronic microvascular ischemic changes. There is no acute intracranial hemorrhage. No mass effect or midline shift. No extra-axial fluid collection. Vascular: No hyperdense vessel or unexpected calcification. Skull: Normal. Negative for fracture or focal lesion. Sinuses/Orbits: Mild mucoperiosteal thickening of paranasal sinuses. No air-fluid level. The left mastoid air cells are clear. Right mastoid effusions. Other: Partially visualized 2.3 x 5.4 cm mass with soft tissue air in the left side of the neck, possibly a cavitary lesion and concerning for malignancy. Clinical correlation is recommended. IMPRESSION: 1. No acute intracranial pathology. 2. Mild age-related atrophy and moderate chronic microvascular ischemic changes. 3. Partially visualized cavitary soft tissue mass in the left side of the neck concerning for malignancy. Correlation with clinical exam recommended. Electronically Signed   By: Elgie Collard M.D.   On: 08/27/2022 21:23   DG Chest Portable 1 View  Result Date: 08/27/2022 CLINICAL DATA:  Weakness, fall EXAM: PORTABLE CHEST 1 VIEW  COMPARISON:  08/10/2022 FINDINGS: There is hyperinflation of the lungs compatible with COPD. Heart is normal size. Tortuous, ectatic aorta with diffuse atherosclerosis. Diffuse interstitial prominence throughout the lungs, stable since prior study. This likely reflects chronic lung disease. Left base atelectasis or scarring. No effusions. No acute bony abnormality. IMPRESSION: COPD/chronic changes. Left base atelectasis or scarring. No active disease. Electronically Signed   By: Charlett Nose M.D.   On: 08/27/2022 19:44   DG Hips Bilat W or Wo Pelvis 5 Views  Result Date: 08/27/2022 CLINICAL DATA:  Weakness, fall EXAM: DG HIP (WITH OR WITHOUT PELVIS) 5+V BILAT COMPARISON:  12/22/2021 FINDINGS: Prior left hip replacement. 3 screws are seen within the right proximal femur related to remote injury. No acute fracture, subluxation or dislocation. IMPRESSION: Postoperative changes bilaterally as above. No acute bony abnormality. Electronically Signed   By: Charlett Nose M.D.   On: 08/27/2022 19:42   DG Knee Complete 4 Views Left  Result Date: 08/26/2022 CLINICAL DATA:  fall EXAM: LEFT KNEE - COMPLETE 4+ VIEW COMPARISON:  None Available. FINDINGS: There is diffuse osteopenia of the visualized osseous structures. No acute fracture or dislocation. No aggressive osseous lesion. There are degenerative changes of the knee joint in the form of mildly reduced tibio-femoral compartment joint space, tibial spiking and osteophytosis. No knee effusion or focal soft tissue swelling. No radiopaque foreign bodies. IMPRESSION: No acute fracture or dislocation. Mild degenerative changes of the knee. Electronically Signed   By: Jules Schick M.D.   On: 08/26/2022 16:29   DG Elbow Complete Left  Result Date: 08/26/2022 CLINICAL DATA:  fall EXAM: LEFT ELBOW - COMPLETE 3+ VIEW COMPARISON:  None Available. FINDINGS: There is diffuse osteopenia of the visualized osseous structures. No acute fracture or dislocation. No aggressive  osseous lesion. Mild degenerative arthritis of elbow joint. No radiopaque foreign bodies. Soft tissues are within normal limits. IMPRESSION: *No acute fracture or dislocation. Electronically Signed   By: Jules Schick M.D.   On: 08/26/2022 16:27    Pending Labs Unresulted Labs (From admission, onward)    None       Vitals/Pain Today's Vitals   08/28/22 0030 08/28/22 0100 08/28/22 0130 08/28/22 0230  BP: (!) 99/58 115/81 103/70 90/70  Pulse: 72 78 76 79  Resp: 13   (!) 26  Temp:      TempSrc:      SpO2: 99% 99% 100% 99%  Weight:      Height:      PainSc:        Isolation Precautions No active isolations  Medications Medications  sodium chloride 0.9 % bolus 500 mL (0 mLs Intravenous Stopped 08/27/22 2045)  sodium chloride 0.9 % bolus 500 mL (0 mLs Intravenous Stopped 08/27/22 2336)  sodium chloride 0.9 % bolus 500 mL (0 mLs Intravenous Stopped 08/28/22 0006)  sodium chloride 0.9 % bolus 500 mL ( Intravenous Stopped 08/28/22 0109)    Mobility walks with device     Focused Assessments See provider note.   R Recommendations: See Admitting Provider Note  Report given to:   Additional Notes: Pt A&Ox4, hard of hearing.

## 2022-08-29 DIAGNOSIS — I959 Hypotension, unspecified: Secondary | ICD-10-CM | POA: Diagnosis not present

## 2022-08-29 DIAGNOSIS — E861 Hypovolemia: Secondary | ICD-10-CM | POA: Diagnosis not present

## 2022-08-29 LAB — CBC
HCT: 31.1 % — ABNORMAL LOW (ref 36.0–46.0)
Hemoglobin: 9.4 g/dL — ABNORMAL LOW (ref 12.0–15.0)
MCH: 27.9 pg (ref 26.0–34.0)
MCHC: 30.2 g/dL (ref 30.0–36.0)
MCV: 92.3 fL (ref 80.0–100.0)
Platelets: 184 10*3/uL (ref 150–400)
RBC: 3.37 MIL/uL — ABNORMAL LOW (ref 3.87–5.11)
RDW: 12.5 % (ref 11.5–15.5)
WBC: 4 10*3/uL (ref 4.0–10.5)
nRBC: 0 % (ref 0.0–0.2)

## 2022-08-29 LAB — BASIC METABOLIC PANEL
Anion gap: 8 (ref 5–15)
BUN: 14 mg/dL (ref 8–23)
CO2: 23 mmol/L (ref 22–32)
Calcium: 8.4 mg/dL — ABNORMAL LOW (ref 8.9–10.3)
Chloride: 109 mmol/L (ref 98–111)
Creatinine, Ser: 0.89 mg/dL (ref 0.44–1.00)
GFR, Estimated: >60 mL/min (ref >60)
Glucose, Bld: 91 mg/dL (ref 70–99)
Potassium: 3.9 mmol/L (ref 3.5–5.1)
Sodium: 140 mmol/L (ref 135–145)

## 2022-08-29 MED ORDER — IPRATROPIUM-ALBUTEROL 0.5-2.5 (3) MG/3ML IN SOLN
3.0000 mL | Freq: Four times a day (QID) | RESPIRATORY_TRACT | Status: DC
  Administered 2022-08-29 – 2022-08-30 (×4): 3 mL via RESPIRATORY_TRACT
  Filled 2022-08-29 (×3): qty 3

## 2022-08-29 NOTE — Plan of Care (Signed)

## 2022-08-29 NOTE — TOC Progression Note (Addendum)
Transition of Care [facility]) - Progression Note    Patient Details  Name: [name] MRN: [id number] Date of Birth: 1934/05/22  [facility] CM/SW Contact  Princella Ion, LCSW Phone Number: 08/29/2022, 1:26 PM  Clinical Narrative:    CSW was notified by MD of extensive conversation with pt's daughter, [name], who reported she, her daughter, and son takes care of pt. [name] reported that her daughter is currently on a cruise and no one is available to be with the pt 24/7 in the home;therefore, they would like SNF. Per chart review, pt came from [facility]. CSW spoke with [name] who reported pt's family was not pleased with care and SNF began process on Friday to transition pt to [facility]. [name] reports they would accept pt back and continue to work on transition if family agreed but informed their plan was to d/c pt from their facility on Tuesday.  CSW spoke with pt's daughter to inform of Medicare guidelines and requirement for 3 midnight inpatient stay and that pt has only been admitted inpatient for 1 midnight. Daughter became agitated and began yelling and cursing on call stating pt was not returning to [facility], there was no one at home to be with her since she works, and that "hospital is throwing all of this on her on a Sunday."  CSW informed daughter that since pt does not have qualifying stay, their options were to private pay for SNF [facility]) or return home with [facility]. Daughter continued yelling and screaming stating she is "all of a sudden receiving calls about mama coming home today and Home Health wouldn't be available today." CSW informed pt's discharge depends on medical readiness and as of today, MD feels pt is stable. CSW also informed HH services are not intended to be 24 hour care and that family is responsible for care when other services are not in place.  Daughter continued to yell and this CSW informed she'd speak with MD and disconnected the call.    Case discussed with MD at length who is consulting Physician Advisor to see if pt still qualifies for current status of inpatient. MD reports he will outreach to daughter if needed. TOC following.  Addend @ 1:43 PM MD informed pt is not meeting criteria for inpatient and has spoken with daughter. Daughter states they will not be picking pt up today and will plan to pick her up tomorrow.     Expected Discharge Plan: Home w Home Health Services Barriers to Discharge: No Barriers Identified  Expected Discharge Plan and Services     Post Acute Care Choice: Home Health Living arrangements for the past 2 months: Skilled Nursing Facility, Single Family Home                                       Social Determinants of Health (SDOH) Interventions SDOH Screenings   Food Insecurity: No Food Insecurity (08/28/2022)  Housing: Low Risk  (08/28/2022)  Transportation Needs: No Transportation Needs (08/28/2022)  Utilities: Not At Risk (08/28/2022)  Tobacco Use: High Risk (08/27/2022)    Readmission Risk Interventions    07/26/2022   12:28 PM 09/30/2021    9:22 AM 06/12/2021   12:11 PM  Readmission Risk Prevention Plan  Transportation Screening Complete Complete Complete  PCP or Specialist Appt within 5-7 Days Not Complete    PCP or Specialist Appt within 3-5 Days  Complete  Home Care Screening Complete    Medication Review (RN CM) Complete    HRI or Home Care Consult   Complete  Social Work Consult for Recovery Care Planning/Counseling   Complete  Palliative Care Screening   Not Applicable  Medication Review [employer])  Complete Complete  HRI or Home Care Consult  Complete   SW Recovery Care/Counseling Consult  Complete   Palliative Care Screening  Not Applicable   Skilled Nursing Facility  Complete

## 2022-08-29 NOTE — Progress Notes (Addendum)
PROGRESS NOTE   [name]  DGL:[id number]    DOB: 26-Sep-1934    DOA: 08/27/2022  PCP: Lynnea Ferrier, MD   I have briefly reviewed patients previous medical records in [facility].  Chief Complaint  Patient presents with   Fall    Brief Narrative:  88 year old female, presented to ED on 8/22, brought in by ambulance from [facility] after witnessed fall after she slipped out of her wheelchair, denied hitting her head.  PMH of HTN, COPD, HFpEF, IBS, pulmonary fibrosis.  In the ED, she had x-rays of elbow and knees, cleared for DC back to NH but family insisted on patient having x-ray of the hips.  In the ED, patient noted to be hypotensive and mildly hypoxic.  She was also complaining of hip pain.  In the ED, BP 87/55, creatinine 1.34, lactate of 2.8, hemoglobin of 10.7.  Imaging without fractures or acute findings.  Admitted with diagnosis of fall at [facility], hypotension, hypoxia, hypokalemia, elevated lactate and elevated creatinine.  8/25: Patient is optimized for DC.  As discussed with the physician advisor on-call, she does not meet medical necessity for inpatient, appropriate to change to observation and will be given a code 44.  Thereby she does not meet criteria to discharge to SNF.  Daughter was interested in pursuing SNF but can only go back to [facility] which she is absolutely refusing.  After extensive discussion with daughter, she says that she does not have all the things in place to take patient home today and plans to do so on 8/26.   Assessment & Plan:  Principal Problem:   Hypotension Active Problems:   Chronic diastolic CHF (congestive heart failure) (HCC)   GERD (gastroesophageal reflux disease)   Generalized weakness   Mixed hyperlipidemia   Dehydration   Hypokalemia   Hip pain   Lactic acidosis   Hypotension: May be due to dehydration. Resolved after IV fluids. Remains at risk for recurrent dehydration, hypotension and AKI related to  inconsistent oral intake.  Elevated lactate: Secondary to dehydration and mild renal insufficiency. Resolved after IV fluids  Generalized weakness: Secondary to dehydration, hypokalemia, mild renal insufficiency complicating very advanced age and overall frail physical health PT evaluated and recommended home health.  She ambulated 85 feet with PT on 8/24. Please see discussion above.  Hospital care has only crossed 1 midnight.  No medical necessity for inpatient.  Will be changed to observation.  Options available to patient are to go to the previous SNF i.e. [facility] or return home with home health services.  Daughter does not want patient to go back to [facility] and plans on taking patient home tomorrow after she has made necessary arrangements.  She is unable to take patient home today.  Patient's RN and TOC aware.  Fall at [facility]: Extensive imaging including CT head, chest x-ray, bilateral hips with pelvis, left knee, left elbow without acute findings. Symptomatic treatment of pain, minimize opioids.  PT and OT evaluation Has used a single dose of Tylenol and a single dose of tramadol thus far.  Hypokalemia: Replaced.  Magnesium 2.1.  Stage IIIa chronic kidney disease  (not 3B as indicated earlier in H&P) Baseline creatinine probably in the 1.1 range.  Presented with creatinine of 1.34.  Does not meet criteria for AKI. Creatinine has normalized/0.89 on 8/25 Avoid hypotension and nephrotoxic's.  COPD No clinical bronchospasm. Continue DuoNebs and [employer] Per nursing report, was not on O2 PTA.  No hypoxia.  Saturating in the 92-94% on room air.  Chronic diastolic CHF: Clinically euvolemic. TTE April 2024 showed LVEF of 55 to 60%  Dementia: Not fully oriented but no agitation. Continue Aricept and Xanax Delirium precautions  GERD Continue PPI  Mixed hyperlipidemia Continue Zocor  Cavitary soft tissue mass left side of neck Noted on CT head and reports  concerning for malignancy Not sure given her age, frail physical health, dementia, multiple severe significant comorbidities, that she will be a candidate for aggressive evaluation or management.   Discussed in detail with patient and daughter.  They both indicate that this has been present "almost all her life".  They indicate that this had been excised once in the past but has come back.  Advised them about likelihood of cancer.  Patient clearly indicates that she does not want to go through with any invasive workup or surgery.  Daughter is aware.  Anemia: May be anemia of chronic disease.  Looking at prior numbers, appears to be at baseline  Thrombocytopenia, mild Resolved  Hypoxia: Noted in EDP note.  However do not see a true desat value.  Also even if she had hypoxia, does not meet criteria for acute respiratory failure with hypoxia.   Appears to have resolved.  Adult failure to thrive: Multifactorial due to very advanced age, frail physical health, underlying dementia and multiple severe significant comorbidities.  Body mass index is 23.92 kg/m.  Pressure Ulcer:  ACP Documents: DNR present DVT prophylaxis: heparin injection 5,000 Units Start: 08/28/22 0600 SCDs Start: 08/28/22 0423     Code Status: DNR:  Family Communication: Daughter via phone Disposition:  Medically optimized for DC to next level of care.  Daughter wishes to pursue SNF.     Consultants:     Procedures:     Antimicrobials:      Subjective:  Seen this morning along with her female RN at bedside.  Patient crying and stating that if she was going to be discharged, she wants to be discharged home where she lives with her daughter and granddaughter.  She does not wish to be discharged to nursing home.  Denies dyspnea or pain.  Rest of history as noted above.  Objective:   Vitals:   08/29/22 0450 08/29/22 0834 08/29/22 0900 08/29/22 0956  BP: (!) 124/58  130/83   Pulse: 70  82   Resp: 18  (!) 28  20  Temp: 98 F (36.7 C)     TempSrc:      SpO2: 94% 93% 92%   Weight:      Height:        General exam: Elderly female, moderately built, frail and chronically ill looking lying comfortably propped up in bed.  Oral mucosa moist. Neck: Multilobulated large firm to hard mass on left side of neck with superficial area of scab.  Nonpulsatile.  Nontender. Respiratory system: Clear to auscultation. Respiratory effort normal. Cardiovascular system: S1 & S2 heard, RRR. No JVD, murmurs, rubs, gallops or clicks. No pedal edema.  Telemetry was discontinued yesterday Gastrointestinal system: Abdomen is nondistended, soft and nontender. No organomegaly or masses felt. Normal bowel sounds heard. Central nervous system: Alert and oriented x 2. No focal neurological deficits. Extremities: Symmetric 5 x 5 power. Skin: No rashes, lesions or ulcers Psychiatry: Judgement and insight impaired. Mood & affect appropriate.  Despite this, patient appears to have medical decision-making capacity.    Data Reviewed:   I have personally reviewed following labs and imaging studies  CBC: Recent Labs  Lab 08/26/22 1616 08/27/22 1930 08/28/22 0435 08/29/22 0404  WBC 6.9 7.9 5.7 4.0  NEUTROABS 4.9 5.7  --   --   HGB 12.7 10.7* 9.6* 9.4*  HCT 41.2 34.6* 31.9* 31.1*  MCV 92.2 91.5 93.0 92.3  PLT 228 193 136* 184    Basic Metabolic Panel: Recent Labs  Lab 08/26/22 1616 08/27/22 1930 08/28/22 0435 08/29/22 0404  NA 135 134* 139 140  K 4.9 3.2* 4.0 3.9  CL 95* 95* 104 109  CO2 28 26 27 23   GLUCOSE 87 168* 112* 91  BUN 32* 26* 23 14  CREATININE 1.34* 1.34* 1.06* 0.89  CALCIUM 8.6* 8.6* 8.2* 8.4*  MG  --   --  2.1  --   PHOS  --   --  3.1  --     Liver Function Tests: Recent Labs  Lab 08/26/22 1616 08/27/22 1930 08/28/22 0435  AST 26 22 16   ALT 12 11 10   ALKPHOS 111 96 83  BILITOT 0.4 0.4 0.4  PROT 8.2* 7.1 6.1*  ALBUMIN 3.7 3.3* 2.8*    CBG: No results for input(s): "GLUCAP" in  the last 168 hours.  Microbiology Studies:   Recent Results (from the past 240 hour(s))  Culture, blood (routine x 2)     Status: None (Preliminary result)   Collection Time: 08/27/22  7:30 PM   Specimen: BLOOD LEFT ARM  Result Value Ref Range Status   Specimen Description BLOOD LEFT ARM BLOOD  Final   Special Requests   Final    BOTTLES DRAWN AEROBIC AND ANAEROBIC Blood Culture adequate volume   Culture   Final    NO GROWTH 2 DAYS Performed at [facility], [address] [place], [place] [address]    Report Status PENDING  Incomplete  Culture, blood (routine x 2)     Status: None (Preliminary result)   Collection Time: 08/27/22  7:40 PM   Specimen: BLOOD LEFT HAND  Result Value Ref Range Status   Specimen Description BLOOD LEFT HAND BLOOD  Final   Special Requests   Final    BOTTLES DRAWN AEROBIC ONLY Blood Culture adequate volume   Culture   Final    NO GROWTH 2 DAYS Performed at [facility], [address] [place], [place] [address]    Report Status PENDING  Incomplete  MRSA Next Gen by PCR, Nasal     Status: None   Collection Time: 08/28/22  4:08 AM   Specimen: Nasal Mucosa; Nasal Swab  Result Value Ref Range Status   MRSA by PCR Next Gen NOT DETECTED NOT DETECTED Final    Comment: (NOTE) The GeneXpert MRSA Assay (FDA approved for NASAL specimens only), is one component of a comprehensive MRSA colonization surveillance program. It is not intended to diagnose MRSA infection nor to guide or monitor treatment for MRSA infections. Test performance is not FDA approved in patients less than 25 years old. Performed at [facility], [address] [place], [place] [address]     Radiology Studies:  CT Head Wo Contrast  Result Date: 08/27/2022 CLINICAL DATA:  Fall and trauma to the head. EXAM: CT HEAD WITHOUT CONTRAST TECHNIQUE: Contiguous axial images were obtained from the base of the skull through the vertex without intravenous contrast. RADIATION DOSE REDUCTION: This  exam was performed according to the departmental dose-optimization program which includes automated exposure control, adjustment of the mA and/or kV according to patient size and/or use of iterative reconstruction technique. COMPARISON:  Head CT dated 08/10/2022. FINDINGS: Brain: Mild age-related atrophy and moderate chronic microvascular ischemic changes. There is no acute intracranial hemorrhage. No mass effect or midline shift. No extra-axial fluid collection. Vascular: No hyperdense vessel or unexpected calcification. Skull: Normal. Negative for fracture or focal lesion. Sinuses/Orbits: Mild mucoperiosteal thickening of paranasal sinuses. No air-fluid level. The left mastoid air cells are clear. Right mastoid effusions. Other: Partially visualized 2.3 x 5.4 cm mass with soft tissue air in the left side of the neck, possibly a cavitary lesion and concerning for malignancy. Clinical correlation is recommended. IMPRESSION: 1. No acute intracranial pathology. 2. Mild age-related atrophy and moderate chronic microvascular ischemic changes. 3. Partially visualized cavitary soft tissue mass in the left side of the neck concerning for malignancy. Correlation with clinical exam recommended. Electronically Signed   By: Elgie Collard M.D.   On: 08/27/2022 21:23   DG Chest Portable 1 View  Result Date: 08/27/2022 CLINICAL DATA:  Weakness, fall EXAM: PORTABLE CHEST 1 VIEW COMPARISON:  08/10/2022 FINDINGS: There is hyperinflation of the lungs compatible with COPD. Heart is normal size. Tortuous, ectatic aorta with diffuse atherosclerosis. Diffuse interstitial prominence throughout the lungs, stable since prior study. This likely reflects chronic lung disease. Left base atelectasis or scarring. No effusions. No acute bony abnormality. IMPRESSION: COPD/chronic changes. Left base atelectasis or scarring. No active disease. Electronically Signed   By: Charlett Nose M.D.   On: 08/27/2022 19:44   DG Hips Bilat W or Wo  Pelvis 5 Views  Result Date: 08/27/2022 CLINICAL DATA:  Weakness, fall EXAM: DG HIP (WITH OR WITHOUT PELVIS) 5+V BILAT COMPARISON:  12/22/2021 FINDINGS: Prior left hip replacement. 3 screws are seen within the right proximal femur related to remote injury. No acute fracture, subluxation or dislocation. IMPRESSION: Postoperative changes bilaterally as above. No acute bony abnormality. Electronically Signed   By: Charlett Nose M.D.   On: 08/27/2022 19:42    Scheduled Meds:    ALPRAZolam  0.5 mg Oral BID   ascorbic acid  500 mg Oral Daily   Chlorhexidine Gluconate Cloth  6 each Topical Q0600   donepezil  10 mg Oral Daily   fluticasone furoate-vilanterol  1 puff Inhalation q morning   heparin  5,000 Units Subcutaneous Q8H   ipratropium-albuterol  3 mL Nebulization Q6H   loratadine  10 mg Oral Daily   memantine  10 mg Oral BID   pantoprazole  40 mg Oral Daily   potassium chloride  10 mEq Oral Daily   simvastatin  10 mg Oral Daily   zolpidem  5 mg Oral QHS    Continuous Infusions:       LOS: 1 day     Marcellus Scott, MD,  FACP, FHM, SFHM, [facility], [facility]   [facility] Advisor [place]     To contact the attending provider between 7A-7P or the covering provider during after hours 7P-7A, please log into the web site [email or web address] and access using universal [place] password for that web site. If you do not have the password, please call the hospital operator.  08/29/2022, 12:19 PM

## 2022-08-29 NOTE — TOC Initial Note (Signed)
Transition of Care [facility]) - Initial/Assessment Note    Patient Details  Name: [name] MRN: [id number] Date of Birth: 1934/07/22  [facility] CM/SW Contact:    Princella Ion, LCSW Phone Number: 08/29/2022, 10:53 AM  Clinical Narrative:                 Pt brought in from [facility] for fall. PT recommended HH PT. CSW spoke with daughter to inform of recommendation and daughter stated there will not be anyone with pt 24 hours a day in the home. CSW noted pt walked 85 ft with PT using RW. Daughter states she is in the process of getting pt a new RW at the pharmacy. CSW inquired about interest in [facility]. Daughter verbalized "y'all are getting on my damn nerves. I ain't talked to a doctor or nobody." CSW did inform MD is planning to reach out and followed up via secure chat. CSW did offer choice if discharge plan remains Home w/HH. Daughter reported they have previously used [facility]. TOC to coordinate [facility] services. D/c date unknown at this time.   Expected Discharge Plan: Home w Home Health Services Barriers to Discharge: No Barriers Identified   Patient Goals and CMS Choice   CMS Medicare.gov Compare Post Acute Care list provided to:: Legal Guardian Choice offered to / list presented to : Adult Children      Expected Discharge Plan and Services     Post Acute Care Choice: Home Health Living arrangements for the past 2 months: Skilled Nursing Facility, Single Family Home                                      Prior Living Arrangements/Services Living arrangements for the past 2 months: Skilled Nursing Facility, Single Family Home Lives with:: Adult Children                   Activities of Daily Living Home Assistive Devices/Equipment: Environmental consultant (specify type) ADL Screening (condition at time of admission) Patient's cognitive ability adequate to safely complete daily activities?: Yes Is the patient deaf or have difficulty hearing?: Yes Does the  patient have difficulty seeing, even when wearing glasses/contacts?: Yes Does the patient have difficulty concentrating, remembering, or making decisions?: No Patient able to express need for assistance with ADLs?: Yes Does the patient have difficulty dressing or bathing?: Yes Independently performs ADLs?: No Communication: Independent Dressing (OT): Needs assistance Is this a change from baseline?: Pre-admission baseline Grooming: Needs assistance Is this a change from baseline?: Pre-admission baseline Feeding: Independent Bathing: Needs assistance Is this a change from baseline?: Pre-admission baseline Toileting: Needs assistance Is this a change from baseline?: Pre-admission baseline In/Out Bed: Needs assistance Is this a change from baseline?: Pre-admission baseline Walks in Home: Independent with device (comment) Does the patient have difficulty walking or climbing stairs?: Yes Weakness of Legs: Both Weakness of Arms/Hands: Both  Permission Sought/Granted                  Emotional Assessment              Admission diagnosis:  Hypotension [I95.9] Hypotension, unspecified hypotension type [I95.9] Patient Active Problem List   Diagnosis Date Noted   Hypotension 08/28/2022   Dehydration 08/28/2022   Hypokalemia 08/28/2022   Hip pain 08/28/2022   Lactic acidosis 08/28/2022   Closed fracture of right distal radius 07/28/2022  Pressure injury of skin 07/26/2022   Closed L1 vertebral fracture (HCC) 07/26/2022   Fall 07/25/2022   Compression fracture of L1,L2 vertebra (HCC) 07/25/2022   Influenza A 04/28/2022   Hypoxia 04/23/2022   Anemia, unspecified 11/23/2021   Long term (current) use of inhaled steroids 11/23/2021   Unspecified dementia, unspecified severity, with mood disturbance (HCC) 11/23/2021   Fracture of femoral neck, left, closed (HCC) 09/24/2021   Acute respiratory failure with hypoxia (HCC) 06/10/2021   Tobacco use disorder 06/10/2021    Generalized weakness 06/10/2021   Bilateral conjunctivitis 06/10/2021   Chronic diastolic CHF (congestive heart failure) (HCC) 05/14/2021   Depression 05/14/2021   Hyperlipidemia    COPD exacerbation (HCC) 05/13/2021   Long term (current) use of opiate analgesic 01/04/2021   Chronic pain 04/23/2019   General unsteadiness 04/23/2019   Polyarthropathy 04/23/2019   Pseudobulbar affect 04/23/2019   Tremor 04/23/2019   Chronic obstructive pulmonary disease, unspecified (HCC)    Gastro-esophageal reflux disease without esophagitis    Hyperlipidemia, unspecified    Pulmonary fibrosis, unspecified (HCC)    Acute on chronic diastolic (congestive) heart failure (HCC)    Age-related osteoporosis without current pathological fracture    Anxiety disorder, unspecified    Chest pain, unspecified    Chronic diastolic (congestive) heart failure (HCC)    Collapsed vertebra, not elsewhere classified, site unspecified, subsequent encounter for fracture with routine healing    Edema, unspecified    Lumbago with sciatica    Overactive bladder    Pain in unspecified hand    Pressure ulcer of unspecified site, unspecified stage    Unspecified protein-calorie malnutrition (HCC)    Acute diastolic heart failure (HCC) 09/30/2018   Dyspnea 09/29/2018   Pneumonia 06/30/2018   Acute CHF (congestive heart failure) (HCC) 06/29/2018   Vascular dementia without behavioral disturbance (HCC) 06/26/2018   CAP (community acquired pneumonia) 06/23/2018   Bilateral pleural effusion 06/23/2018   Leukocytosis 06/20/2018   Chronic anemia 06/20/2018   Dementia (HCC) 06/19/2018   Bilateral lower extremity edema 06/18/2018   Protein-calorie malnutrition, severe (HCC) 06/18/2018   S/P internal fixation right hip fracture cannulated screw placement 04/06/18 04/27/2018   UTI (urinary tract infection) 04/11/2018   Altered mental status 04/10/2018   Anxiety 04/10/2018   Hyponatremia 04/10/2018   UTI (urinary tract  infection) due to urinary indwelling catheter (HCC) 04/10/2018   Chronic constipation 04/09/2018   Urinary retention 04/09/2018   Closed right hip fracture (HCC) 04/05/2018   GERD (gastroesophageal reflux disease) 04/05/2018   COPD with acute exacerbation (HCC) 04/05/2018   Hiatal hernia    Dyspepsia 04/04/2014   Hematochezia 04/30/2011   RUQ pain 03/25/2011   Adenomatous polyps 03/25/2011   ABDOMINAL PAIN, LEFT LOWER QUADRANT 10/16/2009   HYPERLIPIDEMIA 10/09/2009   BRONCHITIS 10/09/2009   Osteoporosis 10/09/2009   Personal history of pneumonia (recurrent) 01/05/1999   Mixed hyperlipidemia 01/05/1999   PCP:  Lynnea Ferrier, MD Pharmacy:   [facility] - [place], [place] - [address] [place] [address] Phone: [phone] Fax: [phone]  [facility] - [address] [place], [place] - [address] [place] [address] Phone: [phone] Fax: [phone]  [facility] [id number] - [place], [place] - [address] AT [facility] [address] [place] [address] Phone: [phone] Fax: [phone]     Social Determinants of Health (SDOH) Social History: SDOH Screenings   Food  Insecurity: No Food Insecurity (08/28/2022)  Housing: Low Risk  (08/28/2022)  Transportation Needs: No Transportation Needs (08/28/2022)  Utilities: Not At Risk (08/28/2022)  Tobacco Use: High Risk (08/27/2022)   SDOH Interventions:     Readmission Risk Interventions    07/26/2022   12:28 PM 09/30/2021    9:22 AM 06/12/2021   12:11 PM  Readmission Risk Prevention Plan  Transportation Screening Complete Complete Complete  PCP or Specialist Appt within 5-7 Days Not Complete    PCP or Specialist Appt within 3-5 Days   Complete  Home Care Screening Complete    Medication Review (RN CM) Complete    HRI or Home Care Consult   Complete  Social Work Consult for Recovery Care Planning/Counseling    Complete  Palliative Care Screening   Not Applicable  Medication Review Oceanographer)  Complete Complete  HRI or Home Care Consult  Complete   SW Recovery Care/Counseling Consult  Complete   Palliative Care Screening  Not Applicable   Skilled Nursing Facility  Complete

## 2022-08-29 NOTE — Care Management Obs Status (Signed)
MEDICARE OBSERVATION STATUS NOTIFICATION   Patient Details  Name: [name] MRN: [id number] Date of Birth: 1934-06-19   Medicare Observation Status Notification Given:  Yes    Princella Ion, LCSW 08/29/2022, 2:05 PM

## 2022-08-29 NOTE — Plan of Care (Signed)
  Problem: Nutrition: Goal: Adequate nutrition will be maintained Outcome: Progressing   Problem: Coping: Goal: Level of anxiety will decrease Outcome: Progressing   Problem: Activity: Goal: Risk for activity intolerance will decrease Outcome: Not Progressing   

## 2022-08-29 NOTE — TOC Progression Note (Addendum)
Transition of Care [facility]) - Progression Note    Patient Details  Name: SICLALI JUVERA MRN: [id number] Date of Birth: 07/26/1934  Transition of Care [facility]) CM/SW Contact  Princella Ion, LCSW Phone Number: 08/29/2022, 2:52 PM  Clinical Narrative:    Presented choice to daughter, [name], who selected [facility]. CSW outreached to [place] at [employer] to refer for [facility] PT/OT/SW. Awaiting response back.    Expected Discharge Plan: Home w Home Health Services Barriers to Discharge: No Barriers Identified  Expected Discharge Plan and Services     Post Acute Care Choice: Home Health Living arrangements for the past 2 months: Skilled Nursing Facility, Single Family Home                                       Social Determinants of Health (SDOH) Interventions SDOH Screenings   Food Insecurity: No Food Insecurity (08/28/2022)  Housing: Low Risk  (08/28/2022)  Transportation Needs: No Transportation Needs (08/28/2022)  Utilities: Not At Risk (08/28/2022)  Tobacco Use: High Risk (08/27/2022)    Readmission Risk Interventions    07/26/2022   12:28 PM 09/30/2021    9:22 AM 06/12/2021   12:11 PM  Readmission Risk Prevention Plan  Transportation Screening Complete Complete Complete  PCP or Specialist Appt within 5-7 Days Not Complete    PCP or Specialist Appt within 3-5 Days   Complete  Home Care Screening Complete    Medication Review (RN CM) Complete    HRI or Home Care Consult   Complete  Social Work Consult for Recovery Care Planning/Counseling   Complete  Palliative Care Screening   Not Applicable  Medication Review Oceanographer)  Complete Complete  HRI or Home Care Consult  Complete   SW Recovery Care/Counseling Consult  Complete   Palliative Care Screening  Not Applicable   Skilled Nursing Facility  Complete

## 2022-08-29 NOTE — Care Management CC44 (Signed)
Condition Code 44 Documentation Completed  Patient Details  Name: LAILYN HOFFSTETTER MRN: [id number] Date of Birth: 09/08/1934   Condition Code 44 given:  Yes Patient signature on Condition Code 44 notice:  Yes Documentation of 2 MD's agreement:  Yes Code 44 added to claim:  Yes    Princella Ion, LCSW 08/29/2022, 2:05 PM

## 2022-08-30 DIAGNOSIS — I959 Hypotension, unspecified: Secondary | ICD-10-CM | POA: Diagnosis not present

## 2022-08-30 DIAGNOSIS — E861 Hypovolemia: Secondary | ICD-10-CM | POA: Diagnosis not present

## 2022-08-30 LAB — GLUCOSE, CAPILLARY
Glucose-Capillary: 83 mg/dL (ref 70–99)
Glucose-Capillary: 89 mg/dL (ref 70–99)

## 2022-08-30 MED ORDER — FLUTICASONE FUROATE-VILANTEROL 100-25 MCG/ACT IN AEPB: 1.0000 | INHALATION_SPRAY | Freq: Two times a day (BID) | RESPIRATORY_TRACT | Status: DC

## 2022-08-30 NOTE — Progress Notes (Signed)
Pt refused heparin injection. She verbalizes reason for heparin and understands risks of not  having it. Overnight coverage Dr Thomes Dinning informed. Kellogg RN

## 2022-08-30 NOTE — Discharge Summary (Signed)
Physician Discharge Summary  [name] QVZ:[id number] DOB: Jan 19, 1934 DOA: 08/27/2022  PCP: Lynnea Ferrier, MD  Admit date: 08/27/2022  Discharge date: 08/30/2022  Admitted From:Home  Disposition:  Home  Recommendations for Outpatient Follow-up:  Follow up with PCP in 1-2 weeks Continue home medications as prior Remains high risk for readmission  Home Health: Yes with PT/OT/SW  Equipment/Devices: None  Discharge Condition:Stable  CODE STATUS: DNR  Diet recommendation: Heart Healthy  Brief/Interim Summary: 88 year old female, presented to ED on 8/22, brought in by ambulance from [facility] after witnessed fall after she slipped out of her wheelchair, denied hitting her head. PMH of HTN, COPD, HFpEF, IBS, pulmonary fibrosis. In the ED, she had x-rays of elbow and knees, cleared for DC back to NH but family insisted on patient having x-ray of the hips. In the ED, patient noted to be hypotensive and mildly hypoxic. She was also complaining of hip pain. In the ED, BP 87/55, creatinine 1.34, lactate of 2.8, hemoglobin of 10.7. Imaging without fractures or acute findings. Admitted with diagnosis of fall at [facility], hypotension, hypoxia, hypokalemia, elevated lactate and elevated creatinine.  On 8/25 patient was optimized for discharge and discussions were had with the daughter regarding plans to pursue SNF, but patient and family did not want to go back to [facility] and therefore she will be discharged to home with home health services today.  Her hypotension as well as electrolyte abnormalities have resolved.  She remains at high risk of readmission due to her generalized weakness and remains susceptible to falls.  Discharge Diagnoses:  Principal Problem:   Hypotension Active Problems:   Chronic diastolic CHF (congestive heart failure) (HCC)   GERD (gastroesophageal reflux disease)   Generalized weakness   Mixed hyperlipidemia   Dehydration   Hypokalemia   Hip pain    Lactic acidosis  Physical discharge diagnosis: Generalized weakness in the setting of hypotension likely related to dehydration with mild renal insufficiency.  Adult failure to thrive.  Discharge Instructions  Discharge Instructions     Diet - low sodium heart healthy   Complete by: As directed    If the dressing is still on your incision site when you go home, remove it on the third day after your surgery date. Remove dressing if it begins to fall off, or if it is dirty or damaged before the third day.   Complete by: As directed    Increase activity slowly   Complete by: As directed       Allergies as of 08/30/2022       Reactions   Codeine Anaphylaxis, Shortness Of Breath, Rash   Neurontin [gabapentin] Anaphylaxis   Sulfonamide Derivatives Hives, Shortness Of Breath   Aloe Other (See Comments)   Unknown reaction   Levaquin [levofloxacin] Other (See Comments)   Pt admitted to hospital with lung problems due to this medication.   Morphine    Patient's daughter reports hypoxia requiring O2 with morphine and similar opiates.  But does okay with tramadol.   Septra [sulfamethoxazole-trimethoprim] Hives   Sulfa Antibiotics Hives   Trimethoprim Hives   Vibra-tab [doxycycline] Hives        Medication List     TAKE these medications    acetaminophen 500 MG tablet Commonly known as: TYLENOL Take 1 tablet (500 mg total) by mouth every 8 (eight) hours as needed. What changed: reasons to take this   albuterol 108 (90 Base) MCG/ACT inhaler Commonly known as: ProAir HFA Inhale 2  puffs into the lungs every 4 (four) hours as needed for wheezing or shortness of breath. What changed: when to take this   ALPRAZolam 0.5 MG tablet Commonly known as: XANAX Take 1 tablet (0.5 mg total) by mouth 2 (two) times daily.   ascorbic acid 500 MG tablet Commonly known as: VITAMIN C Take 500 mg by mouth daily.   Breo Ellipta 100-25 MCG/ACT Aepb Generic drug: fluticasone  furoate-vilanterol Inhale 1 puff into the lungs every morning.   diclofenac Sodium 1 % Gel Commonly known as: VOLTAREN APPLY AS DIRECTED TO THE AFFECTED KNEE FOUR TIMES DAILY. What changed: See the new instructions.   donepezil 10 MG tablet Commonly known as: ARICEPT Take 10 mg by mouth daily.   furosemide 40 MG tablet Commonly known as: LASIX Take 40 mg by mouth daily.   ipratropium-albuterol 0.5-2.5 (3) MG/3ML Soln Commonly known as: DUONEB Take 3 mLs by nebulization in the morning, at noon, in the evening, and at bedtime.   lidocaine 5 % Commonly known as: LIDODERM Place 1 patch onto the skin daily as needed. Remove & Discard patch within 12 hours or as directed by MD What changed: reasons to take this   Linzess 145 MCG Caps capsule Generic drug: linaclotide Take 145 mcg by mouth daily as needed (bowel movement).   loratadine 10 MG tablet Commonly known as: CLARITIN Take 10 mg by mouth daily.   memantine 10 MG tablet Commonly known as: NAMENDA Take 10 mg by mouth 2 (two) times daily.   ONE A DAY WOMEN 50 PLUS PO Take 1 tablet by mouth daily.   pantoprazole 40 MG tablet Commonly known as: PROTONIX TAKE (1) TABLET BY MOUTH TWICE A DAY WITH MEALS (BREAKFAST AND SUPPER) What changed: See the new instructions.   potassium chloride 10 MEQ tablet Commonly known as: KLOR-CON Take 10 mEq by mouth daily.   simvastatin 10 MG tablet Commonly known as: ZOCOR Take 10 mg by mouth daily.   traMADol 50 MG tablet Commonly known as: ULTRAM Take 1 tablet (50 mg total) by mouth every 6 (six) hours as needed for moderate pain or severe pain. What changed: when to take this   zolpidem 5 MG tablet Commonly known as: AMBIEN Take 1 tablet (5 mg total) by mouth at bedtime.               Discharge Care Instructions  (From admission, onward)           Start     Ordered   08/30/22 0000  If the dressing is still on your incision site when you go home, remove it on  the third day after your surgery date. Remove dressing if it begins to fall off, or if it is dirty or damaged before the third day.        08/30/22 2841            Follow-up Information     Curtis Sites III, MD. Schedule an appointment as soon as possible for a visit in 1 week(s).   Specialty: Internal Medicine Contact information: [address] [facility] [place] [address] [phone]                Allergies  Allergen Reactions   Codeine Anaphylaxis, Shortness Of Breath and Rash   Neurontin [Gabapentin] Anaphylaxis   Sulfonamide Derivatives Hives and Shortness Of Breath   Aloe Other (See Comments)    Unknown reaction   Levaquin [Levofloxacin] Other (See Comments)  Pt admitted to hospital with lung problems due to this medication.   Morphine     Patient's daughter reports hypoxia requiring O2 with morphine and similar opiates.  But does okay with tramadol.   Septra [Sulfamethoxazole-Trimethoprim] Hives   Sulfa Antibiotics Hives   Trimethoprim Hives   Vibra-Tab [Doxycycline] Hives    Consultations: None   Procedures/Studies: CT Head Wo Contrast  Result Date: 08/27/2022 CLINICAL DATA:  Fall and trauma to the head. EXAM: CT HEAD WITHOUT CONTRAST TECHNIQUE: Contiguous axial images were obtained from the base of the skull through the vertex without intravenous contrast. RADIATION DOSE REDUCTION: This exam was performed according to the departmental dose-optimization program which includes automated exposure control, adjustment of the mA and/or kV according to patient size and/or use of iterative reconstruction technique. COMPARISON:  Head CT dated 08/10/2022. FINDINGS: Brain: Mild age-related atrophy and moderate chronic microvascular ischemic changes. There is no acute intracranial hemorrhage. No mass effect or midline shift. No extra-axial fluid collection. Vascular: No hyperdense vessel or unexpected calcification. Skull: Normal. Negative for  fracture or focal lesion. Sinuses/Orbits: Mild mucoperiosteal thickening of paranasal sinuses. No air-fluid level. The left mastoid air cells are clear. Right mastoid effusions. Other: Partially visualized 2.3 x 5.4 cm mass with soft tissue air in the left side of the neck, possibly a cavitary lesion and concerning for malignancy. Clinical correlation is recommended. IMPRESSION: 1. No acute intracranial pathology. 2. Mild age-related atrophy and moderate chronic microvascular ischemic changes. 3. Partially visualized cavitary soft tissue mass in the left side of the neck concerning for malignancy. Correlation with clinical exam recommended. Electronically Signed   By: Elgie Collard M.D.   On: 08/27/2022 21:23   DG Chest Portable 1 View  Result Date: 08/27/2022 CLINICAL DATA:  Weakness, fall EXAM: PORTABLE CHEST 1 VIEW COMPARISON:  08/10/2022 FINDINGS: There is hyperinflation of the lungs compatible with COPD. Heart is normal size. Tortuous, ectatic aorta with diffuse atherosclerosis. Diffuse interstitial prominence throughout the lungs, stable since prior study. This likely reflects chronic lung disease. Left base atelectasis or scarring. No effusions. No acute bony abnormality. IMPRESSION: COPD/chronic changes. Left base atelectasis or scarring. No active disease. Electronically Signed   By: Charlett Nose M.D.   On: 08/27/2022 19:44   DG Hips Bilat W or Wo Pelvis 5 Views  Result Date: 08/27/2022 CLINICAL DATA:  Weakness, fall EXAM: DG HIP (WITH OR WITHOUT PELVIS) 5+V BILAT COMPARISON:  12/22/2021 FINDINGS: Prior left hip replacement. 3 screws are seen within the right proximal femur related to remote injury. No acute fracture, subluxation or dislocation. IMPRESSION: Postoperative changes bilaterally as above. No acute bony abnormality. Electronically Signed   By: Charlett Nose M.D.   On: 08/27/2022 19:42   DG Knee Complete 4 Views Left  Result Date: 08/26/2022 CLINICAL DATA:  fall EXAM: LEFT KNEE -  COMPLETE 4+ VIEW COMPARISON:  None Available. FINDINGS: There is diffuse osteopenia of the visualized osseous structures. No acute fracture or dislocation. No aggressive osseous lesion. There are degenerative changes of the knee joint in the form of mildly reduced tibio-femoral compartment joint space, tibial spiking and osteophytosis. No knee effusion or focal soft tissue swelling. No radiopaque foreign bodies. IMPRESSION: No acute fracture or dislocation. Mild degenerative changes of the knee. Electronically Signed   By: Jules Schick M.D.   On: 08/26/2022 16:29   DG Elbow Complete Left  Result Date: 08/26/2022 CLINICAL DATA:  fall EXAM: LEFT ELBOW - COMPLETE 3+ VIEW COMPARISON:  None Available. FINDINGS: There is diffuse  osteopenia of the visualized osseous structures. No acute fracture or dislocation. No aggressive osseous lesion. Mild degenerative arthritis of elbow joint. No radiopaque foreign bodies. Soft tissues are within normal limits. IMPRESSION: *No acute fracture or dislocation. Electronically Signed   By: Jules Schick M.D.   On: 08/26/2022 16:27   CT Lumbar Spine Wo Contrast  Result Date: 08/10/2022 CLINICAL DATA:  Compression fracture, lumbar EXAM: CT LUMBAR SPINE WITHOUT CONTRAST TECHNIQUE: Multidetector CT imaging of the lumbar spine was performed without intravenous contrast administration. Multiplanar CT image reconstructions were also generated. RADIATION DOSE REDUCTION: This exam was performed according to the departmental dose-optimization program which includes automated exposure control, adjustment of the mA and/or kV according to patient size and/or use of iterative reconstruction technique. COMPARISON:  CT angio chest 04/23/2022 FINDINGS: Segmentation: 5 lumbar type vertebrae. Alignment: Normal. Vertebrae: Chronic stable L1 and L2 compression fractures. Associated 5 mm retropulsion into the central canal at the L1 level and 4 mm retropulsion into the central canal at the L2 level.  No associated severe osseous neural foraminal or central canal stenosis. No acute fracture or focal pathologic process. Paraspinal and other soft tissues: Negative. Disc levels: Maintained Other: Chronic T12 anterior wedge compression fracture. Atherosclerotic plaque. Cardiomegaly. Hiatal hernia. IMPRESSION: 1. No acute displaced fracture or traumatic listhesis of the lumbar spine in a patient with chronic stable T12, L1, L2 compression fractures. 2. Other imaging findings of potential clinical significance: Cardiomegaly. Hiatal hernia. Aortic Atherosclerosis (ICD10-I70.0). Electronically Signed   By: Tish Frederickson M.D.   On: 08/10/2022 17:24   CT Head Wo Contrast  Result Date: 08/10/2022 CLINICAL DATA:  Mental status change, unknown cause EXAM: CT HEAD WITHOUT CONTRAST TECHNIQUE: Contiguous axial images were obtained from the base of the skull through the vertex without intravenous contrast. RADIATION DOSE REDUCTION: This exam was performed according to the departmental dose-optimization program which includes automated exposure control, adjustment of the mA and/or kV according to patient size and/or use of iterative reconstruction technique. COMPARISON:  CT head 04/23/2022 FINDINGS: Brain: Cerebral ventricle sizes are concordant with the degree of cerebral volume loss. Patchy and confluent areas of decreased attenuation are noted throughout the deep and periventricular white matter of the cerebral hemispheres bilaterally, compatible with chronic microvascular ischemic disease. No evidence of large-territorial acute infarction. No parenchymal hemorrhage. No mass lesion. No extra-axial collection. No mass effect or midline shift. No hydrocephalus. Basilar cisterns are patent. Vascular: No hyperdense vessel. Atherosclerotic calcifications are present within the cavernous internal carotid and vertebral arteries. Skull: No acute fracture or focal lesion. Bilateral temporal mandibular joint degenerative changes  with likely resection of the left condylar process. Sinuses/Orbits: Right mastoid air cell effusion with fluid within the right middle ear. PAranasal sinuses and left mastoid air cells are clear. The orbits are unremarkable. Other: None. IMPRESSION: 1. No acute intracranial abnormality. 2. Right mastoid air cell effusion with fluid within the right middle ear. Electronically Signed   By: Tish Frederickson M.D.   On: 08/10/2022 17:20   DG Chest Portable 1 View  Result Date: 08/10/2022 CLINICAL DATA:  Malaise EXAM: PORTABLE CHEST 1 VIEW COMPARISON:  04/25/2022 x-ray FINDINGS: Portable x-rays rotated to the right. Stable cardiopericardial silhouette. Large hiatal hernia. Calcified aorta. Interstitial changes seen of the lungs, likely chronic. No consolidation, pneumothorax or effusion. Bilateral apical pleural thickening. Old rib fractures. Severe osteopenia. Deformities as well of the right proximal humerus. Please correlate for history of any known injury. Additional evaluation with x-rays of the shoulder as clinically appropriate.  Overlapping cardiac leads. IMPRESSION: Rotated radiograph. Extensive chronic lung changes. No consolidation or edema. Large hiatal hernia. Deformity of the right humeral neck. Appearance is similar to a shoulder x-ray of 03/14/2021. Electronically Signed   By: Karen Kays M.D.   On: 08/10/2022 16:17     Discharge Exam: Vitals:   08/30/22 0607 08/30/22 0721  BP: 133/75   Pulse: 64   Resp: 16   Temp: 97.6 F (36.4 C)   SpO2: 97% 96%   Vitals:   08/29/22 1953 08/29/22 2043 08/30/22 0607 08/30/22 0721  BP:  110/68 133/75   Pulse:  73 64   Resp:  18 16   Temp:  98.4 F (36.9 C) 97.6 F (36.4 C)   TempSrc:  Oral Oral   SpO2: 95% 95% 97% 96%  Weight:      Height:        General: Pt is alert, awake, not in acute distress Cardiovascular: RRR, S1/S2 +, no rubs, no gallops Respiratory: CTA bilaterally, no wheezing, no rhonchi Abdominal: Soft, NT, ND, bowel sounds  + Extremities: no edema, no cyanosis    The results of significant diagnostics from this hospitalization (including imaging, microbiology, ancillary and laboratory) are listed below for reference.     Microbiology: Recent Results (from the past 240 hour(s))  Culture, blood (routine x 2)     Status: None (Preliminary result)   Collection Time: 08/27/22  7:30 PM   Specimen: BLOOD LEFT ARM  Result Value Ref Range Status   Specimen Description BLOOD LEFT ARM BLOOD  Final   Special Requests   Final    BOTTLES DRAWN AEROBIC AND ANAEROBIC Blood Culture adequate volume   Culture   Final    NO GROWTH 3 DAYS Performed at [facility], [address] [place], [place] [address]    Report Status PENDING  Incomplete  Culture, blood (routine x 2)     Status: None (Preliminary result)   Collection Time: 08/27/22  7:40 PM   Specimen: BLOOD LEFT HAND  Result Value Ref Range Status   Specimen Description BLOOD LEFT HAND BLOOD  Final   Special Requests   Final    BOTTLES DRAWN AEROBIC ONLY Blood Culture adequate volume   Culture   Final    NO GROWTH 3 DAYS Performed at [facility], [address] [place], [place] [address]    Report Status PENDING  Incomplete  MRSA Next Gen by PCR, Nasal     Status: None   Collection Time: 08/28/22  4:08 AM   Specimen: Nasal Mucosa; Nasal Swab  Result Value Ref Range Status   MRSA by PCR Next Gen NOT DETECTED NOT DETECTED Final    Comment: (NOTE) The GeneXpert MRSA Assay (FDA approved for NASAL specimens only), is one component of a comprehensive MRSA colonization surveillance program. It is not intended to diagnose MRSA infection nor to guide or monitor treatment for MRSA infections. Test performance is not FDA approved in patients less than 4 years old. Performed at [facility], [address] [place], [place] [address]      Labs: BNP (last 3 results) Recent Labs    02/28/22 2039 04/23/22 1038  BNP 90.0 225.0*   Basic Metabolic  Panel: Recent Labs  Lab 08/26/22 1616 08/27/22 1930 08/28/22 0435 08/29/22 0404  NA 135 134* 139 140  K 4.9 3.2* 4.0 3.9  CL 95* 95* 104 109  CO2 28 26 27 23   GLUCOSE 87 168* 112* 91  BUN 32* 26* 23  14  CREATININE 1.34* 1.34* 1.06* 0.89  CALCIUM 8.6* 8.6* 8.2* 8.4*  MG  --   --  2.1  --   PHOS  --   --  3.1  --    Liver Function Tests: Recent Labs  Lab 08/26/22 1616 08/27/22 1930 08/28/22 0435  AST 26 22 16   ALT 12 11 10   ALKPHOS 111 96 83  BILITOT 0.4 0.4 0.4  PROT 8.2* 7.1 6.1*  ALBUMIN 3.7 3.3* 2.8*   No results for input(s): "LIPASE", "AMYLASE" in the last 168 hours. No results for input(s): "AMMONIA" in the last 168 hours. CBC: Recent Labs  Lab 08/26/22 1616 08/27/22 1930 08/28/22 0435 08/29/22 0404  WBC 6.9 7.9 5.7 4.0  NEUTROABS 4.9 5.7  --   --   HGB 12.7 10.7* 9.6* 9.4*  HCT 41.2 34.6* 31.9* 31.1*  MCV 92.2 91.5 93.0 92.3  PLT 228 193 136* 184   Cardiac Enzymes: No results for input(s): "CKTOTAL", "CKMB", "CKMBINDEX", "TROPONINI" in the last 168 hours. BNP: Invalid input(s): "POCBNP" CBG: Recent Labs  Lab 08/30/22 0743  GLUCAP 83   D-Dimer No results for input(s): "DDIMER" in the last 72 hours. Hgb A1c No results for input(s): "HGBA1C" in the last 72 hours. Lipid Profile No results for input(s): "CHOL", "HDL", "LDLCALC", "TRIG", "CHOLHDL", "LDLDIRECT" in the last 72 hours. Thyroid function studies No results for input(s): "TSH", "T4TOTAL", "T3FREE", "THYROIDAB" in the last 72 hours.  Invalid input(s): "FREET3" Anemia work up No results for input(s): "VITAMINB12", "FOLATE", "FERRITIN", "TIBC", "IRON", "RETICCTPCT" in the last 72 hours. Urinalysis    Component Value Date/Time   COLORURINE YELLOW 08/26/2022 1725   APPEARANCEUR HAZY (A) 08/26/2022 1725   LABSPEC 1.008 08/26/2022 1725   PHURINE 6.0 08/26/2022 1725   GLUCOSEU NEGATIVE 08/26/2022 1725   HGBUR NEGATIVE 08/26/2022 1725   BILIRUBINUR NEGATIVE 08/26/2022 1725   KETONESUR  NEGATIVE 08/26/2022 1725   PROTEINUR NEGATIVE 08/26/2022 1725   UROBILINOGEN 0.2 03/01/2012 2011   NITRITE NEGATIVE 08/26/2022 1725   LEUKOCYTESUR NEGATIVE 08/26/2022 1725   Sepsis Labs Recent Labs  Lab 08/26/22 1616 08/27/22 1930 08/28/22 0435 08/29/22 0404  WBC 6.9 7.9 5.7 4.0   Microbiology Recent Results (from the past 240 hour(s))  Culture, blood (routine x 2)     Status: None (Preliminary result)   Collection Time: 08/27/22  7:30 PM   Specimen: BLOOD LEFT ARM  Result Value Ref Range Status   Specimen Description BLOOD LEFT ARM BLOOD  Final   Special Requests   Final    BOTTLES DRAWN AEROBIC AND ANAEROBIC Blood Culture adequate volume   Culture   Final    NO GROWTH 3 DAYS Performed at [facility], [address] [place], [place] [address]    Report Status PENDING  Incomplete  Culture, blood (routine x 2)     Status: None (Preliminary result)   Collection Time: 08/27/22  7:40 PM   Specimen: BLOOD LEFT HAND  Result Value Ref Range Status   Specimen Description BLOOD LEFT HAND BLOOD  Final   Special Requests   Final    BOTTLES DRAWN AEROBIC ONLY Blood Culture adequate volume   Culture   Final    NO GROWTH 3 DAYS Performed at [facility], [address] [place], [place] [address]    Report Status PENDING  Incomplete  MRSA Next Gen by PCR, Nasal     Status: None   Collection Time: 08/28/22  4:08 AM   Specimen: Nasal Mucosa; Nasal Swab  Result Value Ref Range Status   MRSA by PCR Next Gen NOT DETECTED NOT DETECTED Final    Comment: (NOTE) The GeneXpert MRSA Assay (FDA approved for NASAL specimens only), is one component of a comprehensive MRSA colonization surveillance program. It is not intended to diagnose MRSA infection nor to guide or monitor treatment for MRSA infections. Test performance is not FDA approved in patients less than 42 years old. Performed at [facility], [address] [place], [place] [address]      Time coordinating discharge: 35  minutes  SIGNED:   Erick Blinks, DO [facility] 08/30/2022, 9:25 AM  If 7PM-7AM, please contact night-coverage [email or web address]

## 2022-08-30 NOTE — TOC Transition Note (Signed)
Transition of Care [facility]) - CM/SW Discharge Note   Patient Details  Name: [name] MRN: [id number] Date of Birth: 1934/11/13  Transition of Care [facility]) CM/SW Contact:  Annice Needy, LCSW Phone Number: 08/30/2022, 10:29 AM   Clinical Narrative:    Morrie Sheldon with [facility] accepts referral.    Final next level of care: Home w Home Health Services Barriers to Discharge: No Barriers Identified   Patient Goals and CMS Choice CMS Medicare.gov Compare Post Acute Care list provided to:: Legal Guardian Choice offered to / list presented to : Adult Children  Discharge Placement                         Discharge Plan and Services Additional resources added to the After Visit Summary for       Post Acute Care Choice: Home Health                    HH Arranged: PT, OT, Social Work [facility] Agency: [facility] ([facility]) Date HH Agency Contacted: 08/30/22 Time HH Agency Contacted: 1029 Representative spoke with at [facility] Agency: Morrie Sheldon  Social Determinants of Health (SDOH) Interventions SDOH Screenings   Food Insecurity: No Food Insecurity (08/28/2022)  Housing: Low Risk  (08/28/2022)  Transportation Needs: No Transportation Needs (08/28/2022)  Utilities: Not At Risk (08/28/2022)  Tobacco Use: High Risk (08/27/2022)     Readmission Risk Interventions    07/26/2022   12:28 PM 09/30/2021    9:22 AM 06/12/2021   12:11 PM  Readmission Risk Prevention Plan  Transportation Screening Complete Complete Complete  PCP or Specialist Appt within 5-7 Days Not Complete    PCP or Specialist Appt within 3-5 Days   Complete  Home Care Screening Complete    Medication Review (RN CM) Complete    HRI or Home Care Consult   Complete  Social Work Consult for Recovery Care Planning/Counseling   Complete  Palliative Care Screening   Not Applicable  Medication Review [employer])  Complete Complete  HRI or Home Care Consult  Complete   SW Recovery Care/Counseling Consult   Complete   Palliative Care Screening  Not Applicable   Skilled Nursing Facility  Complete

## 2022-09-01 LAB — CULTURE, BLOOD (ROUTINE X 2)
Culture: NO GROWTH
Culture: NO GROWTH
Special Requests: ADEQUATE
Special Requests: ADEQUATE

## 2022-09-23 ENCOUNTER — Encounter: Payer: Self-pay | Admitting: Cardiology

## 2022-09-23 ENCOUNTER — Ambulatory Visit: Payer: Medicare Other | Attending: Cardiology | Admitting: Cardiology

## 2022-09-23 ENCOUNTER — Telehealth: Payer: Self-pay | Admitting: Cardiology

## 2022-09-23 VITALS — BP 118/72 | HR 82 | Ht <= 58 in | Wt 106.0 lb

## 2022-09-23 DIAGNOSIS — I5032 Chronic diastolic (congestive) heart failure: Secondary | ICD-10-CM | POA: Insufficient documentation

## 2022-09-23 NOTE — Telephone Encounter (Signed)
Patient's daughter is calling to schedule tele appt for 6 months appt. Requesting call back to schedule.

## 2022-09-23 NOTE — Progress Notes (Signed)
Virtual Visit via Telephone Note   Because of [name] co-morbid illnesses, she is at least at moderate risk for complications without adequate follow up.  This format is felt to be most appropriate for this patient at this time.  The patient did not have access to video technology/had technical difficulties with video requiring transitioning to audio format only (telephone).  All issues noted in this document were discussed and addressed.  No physical exam could be performed with this format.  Please refer to the patient's chart for her consent to telehealth for [name].    Date:  09/23/2022   ID:  [name], DOB July 09, 1934, MRN [id number] The patient was identified using 2 identifiers.  Patient Location: Home Provider Location: Office/Clinic  Evaluation Performed:  Follow-Up Visit  History of Present Illness:    [name] is an 88 y.o. female last assessed via telehealth encounter in March.  Records indicate hospitalization in August after fall from wheelchair (she was residing at [facility] at that time).  She did not have any acute fractures, electrolyte abnormalities and hypotension were stabilized with supportive measures.  SNF offered however patient's family wanted to bring her home.  She was discharged on cardiac regimen including Lasix 40 mg daily, KCl 10 mEq daily, Zocor 10 mg daily.  I spoke with her daughter today by phone.  She states that her mother has gotten more mobile, using the walker indoors.  She has Meals on Wheels and also a sitter during the day.  Working with home PT as well.  She denies any recurrent falls.  Prior CV studies:    Echocardiogram 04/24/2022:  1. Left ventricular ejection fraction, by estimation, is 55 to 60%. The  left ventricle has normal function. The left ventricle has no regional  wall motion abnormalities. There is moderate asymmetric left ventricular  hypertrophy of the basal-septal  segment.  Left ventricular diastolic parameters are consistent with Grade I  diastolic dysfunction (impaired relaxation).   2. Right ventricular systolic function is normal. The right ventricular  size is normal. There is normal pulmonary artery systolic pressure. The  estimated right ventricular systolic pressure is 31.5 mmHg.   3. Left atrial size was severely dilated.   4. The mitral valve is degenerative. Trivial mitral valve regurgitation.  No evidence of mitral stenosis.   5. The aortic valve is abnormal. There is mild calcification of the  aortic valve. There is mild thickening of the aortic valve. Aortic valve  regurgitation is not visualized. Aortic valve sclerosis/calcification is  present, without any evidence of aortic   stenosis.   6. The inferior vena cava is normal in size with greater than 50%  respiratory variability, suggesting right atrial pressure of 3 mmHg.   Labs/Other Tests and Data Reviewed:    EKG:  An ECG dated 08/10/2022 was personally reviewed today and demonstrated:  Sinus rhythm with prolonged PR interval.  Recent Labs: 04/23/2022: B Natriuretic Peptide 225.0 08/28/2022: ALT 10; Magnesium 2.1 08/29/2022: BUN 14; Creatinine, Ser 0.89; Hemoglobin 9.4; Platelets 184; Potassium 3.9; Sodium 140    Wt Readings from Last 3 Encounters:  09/23/22 106 lb (48.1 kg)  08/28/22 106 lb 11.2 oz (48.4 kg)  08/10/22 110 lb (49.9 kg)     Objective:    Vital Signs:  BP 118/72   Pulse 82   Ht 4\' 8"  (1.422 m)   Wt 106 lb (48.1 kg)   SpO2 97%   BMI 23.76  kg/m    ASSESSMENT & PLAN:    1.  HFpEF, LVEF 55 to 60% by echocardiogram in April, normal estimated RVSP as well.  For now plan to continue Lasix 40 mg daily with potassium supplement.  Patient's daughter indicates that her mother is eating and drinking reasonably well at this point.  Would continue to keep an eye on weight.   2.  Dementia, on Aricept and Namenda.   3.  COPD.   4.  Mixed hyperlipidemia, on Zocor.   Time:    Today, I have spent 8 minutes with the patient with telehealth technology discussing the above problems.      Follow Up:  Virtual Visit   6 months.  Signed, Nona Dell, MD  09/23/2022 11:20 AM    [facility]

## 2022-09-23 NOTE — Patient Instructions (Addendum)
Medication Instructions:  Your physician recommends that you continue on your current medications as directed. Please refer to the Current Medication list given to you today.   Labwork: None today  Testing/Procedures: None today  Follow-Up: 6 months Virtual Visit (telephone) with Dr.McDowell  Any Other Special Instructions Will Be Listed Below (If Applicable).  If you need a refill on your cardiac medications before your next appointment, please call your pharmacy.

## 2022-10-04 ENCOUNTER — Other Ambulatory Visit: Payer: Self-pay | Admitting: Specialist

## 2022-10-04 DIAGNOSIS — J84112 Idiopathic pulmonary fibrosis: Secondary | ICD-10-CM

## 2022-10-04 DIAGNOSIS — R911 Solitary pulmonary nodule: Secondary | ICD-10-CM

## 2022-11-02 ENCOUNTER — Other Ambulatory Visit (INDEPENDENT_AMBULATORY_CARE_PROVIDER_SITE_OTHER): Payer: Medicare Other

## 2022-11-02 ENCOUNTER — Other Ambulatory Visit: Payer: Self-pay | Admitting: Orthopedic Surgery

## 2022-11-02 ENCOUNTER — Other Ambulatory Visit (INDEPENDENT_AMBULATORY_CARE_PROVIDER_SITE_OTHER): Payer: Self-pay

## 2022-11-02 ENCOUNTER — Encounter: Payer: Self-pay | Admitting: Orthopedic Surgery

## 2022-11-02 ENCOUNTER — Ambulatory Visit (INDEPENDENT_AMBULATORY_CARE_PROVIDER_SITE_OTHER): Payer: Medicare Other | Admitting: Orthopedic Surgery

## 2022-11-02 DIAGNOSIS — G8929 Other chronic pain: Secondary | ICD-10-CM

## 2022-11-02 DIAGNOSIS — M25561 Pain in right knee: Secondary | ICD-10-CM | POA: Diagnosis not present

## 2022-11-02 DIAGNOSIS — M25512 Pain in left shoulder: Secondary | ICD-10-CM

## 2022-11-02 NOTE — Patient Instructions (Signed)

## 2022-11-03 NOTE — Progress Notes (Signed)
Orthopaedic Clinic Return  Assessment: [name] is a 88 y.o. female with the following: Left shoulder pain Right knee pain  Plan: [name] has left shoulder pain, as well as right knee pain.  Radiographs demonstrate some degenerative changes.  No specific injury.  I recommended steroid injections, and she elected to proceed.  Procedure note injection Left shoulder    Verbal consent was obtained to inject the left shoulder, subacromial space Timeout was completed to confirm the site of injection.  The skin was prepped with alcohol and ethyl chloride was sprayed at the injection site.  A 21-gauge needle was used to inject 40 mg of Depo-Medrol and 1% lidocaine (4 cc) into the subacromial space of the left shoulder using a posterolateral approach.  There were no complications. A sterile bandage was applied.   Procedure note injection Right knee joint   Verbal consent was obtained to inject the right knee joint  Timeout was completed to confirm the site of injection.  The skin was prepped with alcohol and ethyl chloride was sprayed at the injection site.  A 21-gauge needle was used to inject 40 mg of Depo-Medrol and 1% lidocaine (4 cc) into the right knee using an anterolateral approach.  There were no complications. A sterile bandage was applied.   Follow-up: Return if symptoms worsen or fail to improve.   Subjective:  Chief Complaint  Patient presents with   Knee Pain    R knee pain    Shoulder Pain    L shoulder    History of Present Illness: [name] is a 88 y.o. female who returns to clinic for evaluation of the right knee and the left shoulder pain.  She is well-known to my clinic.  No specific injury.  According to her daughter, she has had pain in the right knee ever since her hip surgery.  She notes some swelling of the right knee.  She has difficulty with overhead motion of the left shoulder.  Review of Systems: No fevers or chills No numbness or  tingling No chest pain No shortness of breath No bowel or bladder dysfunction No GI distress No headaches   Objective: There were no vitals taken for this visit.  Physical Exam:  Healthy female.  Seated in a wheelchair.  Hard of hearing.  Left shoulder without deformity.  There is atrophy.  No redness.  No swelling.  She has difficulty with overhead motion.  Right knee with mild effusion.  Tenderness palpation medial lateral joint lines.  She is only able to achieve full extension, range of motion from 10-100 degrees.  Negative Lachman.  IMAGING: I personally ordered and reviewed the following images:  Limited views of the left shoulder were obtained in clinic today.  No acute injuries noted.  No obvious osteophytes.  There is no obvious proximal humeral migration, although the humeral head is riding high.  Impression: Negative left shoulder x-ray   Limited views of the right knee were obtained in clinic today.  No acute injuries are noted.  Cannot fully assess the joint space narrowing.  There are no significant osteophytes in the medial or lateral compartments.  No obvious osteophytes on the lateral projection of the patella.  Impression: Negative right knee x-rays   Oliver Barre, MD 11/03/2022 9:10 AM

## 2022-11-27 ENCOUNTER — Other Ambulatory Visit: Payer: Self-pay

## 2022-11-27 ENCOUNTER — Emergency Department (HOSPITAL_COMMUNITY): Payer: Medicare Other

## 2022-11-27 ENCOUNTER — Inpatient Hospital Stay (HOSPITAL_COMMUNITY)
Admission: EM | Admit: 2022-11-27 | Discharge: 2022-12-01 | DRG: 196 | Disposition: A | Discharge status: Home Health | Payer: Medicare Other | Attending: Internal Medicine | Admitting: Internal Medicine

## 2022-11-27 DIAGNOSIS — Z885 Allergy status to narcotic agent status: Secondary | ICD-10-CM

## 2022-11-27 DIAGNOSIS — Z888 Allergy status to other drugs, medicaments and biological substances status: Secondary | ICD-10-CM

## 2022-11-27 DIAGNOSIS — F419 Anxiety disorder, unspecified: Secondary | ICD-10-CM | POA: Diagnosis present

## 2022-11-27 DIAGNOSIS — I5032 Chronic diastolic (congestive) heart failure: Secondary | ICD-10-CM | POA: Diagnosis present

## 2022-11-27 DIAGNOSIS — J189 Pneumonia, unspecified organism: Secondary | ICD-10-CM | POA: Diagnosis present

## 2022-11-27 DIAGNOSIS — J8410 Pulmonary fibrosis, unspecified: Secondary | ICD-10-CM | POA: Diagnosis not present

## 2022-11-27 DIAGNOSIS — J449 Chronic obstructive pulmonary disease, unspecified: Secondary | ICD-10-CM | POA: Diagnosis present

## 2022-11-27 DIAGNOSIS — D649 Anemia, unspecified: Secondary | ICD-10-CM | POA: Diagnosis present

## 2022-11-27 DIAGNOSIS — Z882 Allergy status to sulfonamides status: Secondary | ICD-10-CM

## 2022-11-27 DIAGNOSIS — E875 Hyperkalemia: Secondary | ICD-10-CM | POA: Insufficient documentation

## 2022-11-27 DIAGNOSIS — Z79899 Other long term (current) drug therapy: Secondary | ICD-10-CM

## 2022-11-27 DIAGNOSIS — K219 Gastro-esophageal reflux disease without esophagitis: Secondary | ICD-10-CM | POA: Diagnosis present

## 2022-11-27 DIAGNOSIS — F0390 Unspecified dementia without behavioral disturbance: Secondary | ICD-10-CM | POA: Diagnosis present

## 2022-11-27 DIAGNOSIS — Z1152 Encounter for screening for COVID-19: Secondary | ICD-10-CM

## 2022-11-27 DIAGNOSIS — Z8249 Family history of ischemic heart disease and other diseases of the circulatory system: Secondary | ICD-10-CM

## 2022-11-27 DIAGNOSIS — N179 Acute kidney failure, unspecified: Principal | ICD-10-CM | POA: Insufficient documentation

## 2022-11-27 DIAGNOSIS — M810 Age-related osteoporosis without current pathological fracture: Secondary | ICD-10-CM | POA: Diagnosis present

## 2022-11-27 DIAGNOSIS — K580 Irritable bowel syndrome with diarrhea: Secondary | ICD-10-CM | POA: Diagnosis present

## 2022-11-27 DIAGNOSIS — Z8711 Personal history of peptic ulcer disease: Secondary | ICD-10-CM

## 2022-11-27 DIAGNOSIS — I110 Hypertensive heart disease with heart failure: Secondary | ICD-10-CM | POA: Diagnosis present

## 2022-11-27 DIAGNOSIS — E785 Hyperlipidemia, unspecified: Secondary | ICD-10-CM | POA: Diagnosis present

## 2022-11-27 DIAGNOSIS — Z96642 Presence of left artificial hip joint: Secondary | ICD-10-CM | POA: Diagnosis present

## 2022-11-27 DIAGNOSIS — F17290 Nicotine dependence, other tobacco product, uncomplicated: Secondary | ICD-10-CM | POA: Diagnosis present

## 2022-11-27 DIAGNOSIS — J9601 Acute respiratory failure with hypoxia: Secondary | ICD-10-CM | POA: Diagnosis present

## 2022-11-27 DIAGNOSIS — Z823 Family history of stroke: Secondary | ICD-10-CM

## 2022-11-27 DIAGNOSIS — Z66 Do not resuscitate: Secondary | ICD-10-CM | POA: Diagnosis present

## 2022-11-27 DIAGNOSIS — Z833 Family history of diabetes mellitus: Secondary | ICD-10-CM

## 2022-11-27 LAB — CBC WITH DIFFERENTIAL/PLATELET
Abs Immature Granulocytes: 0.01 10*3/uL (ref 0.00–0.07)
Basophils Absolute: 0 10*3/uL (ref 0.0–0.1)
Basophils Relative: 1 %
Eosinophils Absolute: 0.4 10*3/uL (ref 0.0–0.5)
Eosinophils Relative: 5 %
HCT: 32.3 % — ABNORMAL LOW (ref 36.0–46.0)
Hemoglobin: 10 g/dL — ABNORMAL LOW (ref 12.0–15.0)
Immature Granulocytes: 0 %
Lymphocytes Relative: 17 %
Lymphs Abs: 1.1 10*3/uL (ref 0.7–4.0)
MCH: 28.3 pg (ref 26.0–34.0)
MCHC: 31 g/dL (ref 30.0–36.0)
MCV: 91.5 fL (ref 80.0–100.0)
Monocytes Absolute: 0.9 10*3/uL (ref 0.1–1.0)
Monocytes Relative: 14 %
Neutro Abs: 4.1 10*3/uL (ref 1.7–7.7)
Neutrophils Relative %: 63 %
Platelets: 213 10*3/uL (ref 150–400)
RBC: 3.53 MIL/uL — ABNORMAL LOW (ref 3.87–5.11)
RDW: 14.7 % (ref 11.5–15.5)
WBC: 6.5 10*3/uL (ref 4.0–10.5)
nRBC: 0 % (ref 0.0–0.2)

## 2022-11-27 LAB — URINALYSIS, ROUTINE W REFLEX MICROSCOPIC
Bilirubin Urine: NEGATIVE
Glucose, UA: NEGATIVE mg/dL
Hgb urine dipstick: NEGATIVE
Ketones, ur: NEGATIVE mg/dL
Leukocytes,Ua: NEGATIVE
Nitrite: NEGATIVE
Protein, ur: NEGATIVE mg/dL
Specific Gravity, Urine: 1.006 (ref 1.005–1.030)
pH: 6 (ref 5.0–8.0)

## 2022-11-27 LAB — COMPREHENSIVE METABOLIC PANEL
ALT: 17 U/L (ref 0–44)
AST: 24 U/L (ref 15–41)
Albumin: 3.5 g/dL (ref 3.5–5.0)
Alkaline Phosphatase: 87 U/L (ref 38–126)
Anion gap: 6 (ref 5–15)
BUN: 29 mg/dL — ABNORMAL HIGH (ref 8–23)
CO2: 30 mmol/L (ref 22–32)
Calcium: 8.7 mg/dL — ABNORMAL LOW (ref 8.9–10.3)
Chloride: 101 mmol/L (ref 98–111)
Creatinine, Ser: 1.57 mg/dL — ABNORMAL HIGH (ref 0.44–1.00)
GFR, Estimated: 32 mL/min — ABNORMAL LOW (ref >60)
Glucose, Bld: 82 mg/dL (ref 70–99)
Potassium: 5.6 mmol/L — ABNORMAL HIGH (ref 3.5–5.1)
Sodium: 137 mmol/L (ref 135–145)
Total Bilirubin: 0.4 mg/dL (ref <1.2)
Total Protein: 6.8 g/dL (ref 6.5–8.1)

## 2022-11-27 LAB — BLOOD GAS, VENOUS
Acid-Base Excess: 7.1 mmol/L — ABNORMAL HIGH (ref 0.0–2.0)
Bicarbonate: 32 mmol/L — ABNORMAL HIGH (ref 20.0–28.0)
O2 Saturation: 79 %
Patient temperature: 36.6
pCO2, Ven: 44 mm[Hg] (ref 44–60)
pH, Ven: 7.47 — ABNORMAL HIGH (ref 7.25–7.43)
pO2, Ven: 43 mm[Hg] (ref 32–45)

## 2022-11-27 LAB — TROPONIN I (HIGH SENSITIVITY)
Troponin I (High Sensitivity): 2 ng/L (ref <18)
Troponin I (High Sensitivity): 2 ng/L (ref <18)

## 2022-11-27 LAB — SARS CORONAVIRUS 2 BY RT PCR: SARS Coronavirus 2 by RT PCR: NEGATIVE

## 2022-11-27 LAB — BRAIN NATRIURETIC PEPTIDE: B Natriuretic Peptide: 117 pg/mL — ABNORMAL HIGH (ref 0.0–100.0)

## 2022-11-27 MED ORDER — LACTATED RINGERS IV BOLUS
1000.0000 mL | Freq: Once | INTRAVENOUS | Status: AC
  Administered 2022-11-27: 1000 mL via INTRAVENOUS

## 2022-11-27 MED ORDER — SODIUM CHLORIDE 0.9 % IV SOLN
2.0000 g | Freq: Once | INTRAVENOUS | Status: AC
  Administered 2022-11-27: 2 g via INTRAVENOUS
  Filled 2022-11-27: qty 20

## 2022-11-27 MED ORDER — IOHEXOL 300 MG/ML  SOLN
60.0000 mL | Freq: Once | INTRAMUSCULAR | Status: AC | PRN
  Administered 2022-11-27: 60 mL via INTRAVENOUS

## 2022-11-27 MED ORDER — ALPRAZOLAM 0.5 MG PO TABS
0.5000 mg | ORAL_TABLET | Freq: Once | ORAL | Status: AC
  Administered 2022-11-28: 0.5 mg via ORAL
  Filled 2022-11-27: qty 1

## 2022-11-27 MED ORDER — SODIUM CHLORIDE 0.9 % IV SOLN
500.0000 mg | INTRAVENOUS | Status: DC
  Administered 2022-11-27: 500 mg via INTRAVENOUS
  Filled 2022-11-27: qty 5

## 2022-11-27 NOTE — ED Triage Notes (Signed)
Pt brought in by [facility] pt thinks she has a UTI but does not complain of painful urination. Daughter told EMS staff that patient has been very weak the past several days. Pt is A&O x4.

## 2022-11-27 NOTE — ED Provider Notes (Signed)
[facility] AT [facility] Provider Note  CSN: [id number] Arrival date & time: 11/27/22 1911  Chief Complaint(s) Weakness  HPI [name] is a 88 y.o. female with PMH HTN, COPD, CHF, pulmonary fibrosis who presents emergency department for evaluation of generalized weakness and exertional shortness of breath.  Daughter states that over the last 3 to 4 days patient has been significant more short of breath and has had difficulty working with PT.  Patient reportedly had been found hypotensive by her physical therapist this week and that concerned them.  They are also noticing increased urination and some worsening of her underlying dementia and are concerned that she has an underlying infection.  Denies complaints of abdominal pain, nausea, vomiting, diarrhea or other systemic symptoms.   Past Medical History Past Medical History:  Diagnosis Date   Anxiety    Chronic diastolic (congestive) heart failure (HCC)    Chronic obstructive pulmonary disease, unspecified (HCC)    COPD (chronic obstructive pulmonary disease) (HCC)    GERD (gastroesophageal reflux disease)    Hyperlipidemia    IBS (irritable bowel syndrome)    Lumbago with sciatica    Osteoporosis    Overactive bladder    Pneumonia    Pulmonary fibrosis (HCC)    Patient Active Problem List   Diagnosis Date Noted   Hypotension 08/28/2022   Dehydration 08/28/2022   Hypokalemia 08/28/2022   Hip pain 08/28/2022   Lactic acidosis 08/28/2022   Closed fracture of right distal radius 07/28/2022   Pressure injury of skin 07/26/2022   Closed L1 vertebral fracture (HCC) 07/26/2022   Fall 07/25/2022   Compression fracture of L1,L2 vertebra (HCC) 07/25/2022   Influenza A 04/28/2022   Hypoxia 04/23/2022   Anemia, unspecified 11/23/2021   Long term (current) use of inhaled steroids 11/23/2021   Unspecified dementia, unspecified severity, with mood disturbance (HCC) 11/23/2021   Fracture of femoral  neck, left, closed (HCC) 09/24/2021   Acute respiratory failure with hypoxia (HCC) 06/10/2021   Tobacco use disorder 06/10/2021   Generalized weakness 06/10/2021   Bilateral conjunctivitis 06/10/2021   Chronic diastolic CHF (congestive heart failure) (HCC) 05/14/2021   Depression 05/14/2021   Hyperlipidemia    COPD exacerbation (HCC) 05/13/2021   Long term (current) use of opiate analgesic 01/04/2021   Chronic pain 04/23/2019   General unsteadiness 04/23/2019   Polyarthropathy 04/23/2019   Pseudobulbar affect 04/23/2019   Tremor 04/23/2019   Chronic obstructive pulmonary disease, unspecified (HCC)    Gastro-esophageal reflux disease without esophagitis    Hyperlipidemia, unspecified    Pulmonary fibrosis, unspecified (HCC)    Acute on chronic diastolic (congestive) heart failure (HCC)    Age-related osteoporosis without current pathological fracture    Anxiety disorder, unspecified    Chest pain, unspecified    Chronic diastolic (congestive) heart failure (HCC)    Collapsed vertebra, not elsewhere classified, site unspecified, subsequent encounter for fracture with routine healing    Edema, unspecified    Lumbago with sciatica    Overactive bladder    Pain in unspecified hand    Pressure ulcer of unspecified site, unspecified stage    Unspecified protein-calorie malnutrition (HCC)    Acute diastolic heart failure (HCC) 09/30/2018   Dyspnea 09/29/2018   Pneumonia 06/30/2018   Acute CHF (congestive heart failure) (HCC) 06/29/2018   Vascular dementia without behavioral disturbance (HCC) 06/26/2018   CAP (community acquired pneumonia) 06/23/2018   Bilateral pleural effusion 06/23/2018   Leukocytosis 06/20/2018   Chronic anemia 06/20/2018  Dementia (HCC) 06/19/2018   Bilateral lower extremity edema 06/18/2018   Protein-calorie malnutrition, severe (HCC) 06/18/2018   S/P internal fixation right hip fracture cannulated screw placement 04/06/18 04/27/2018   UTI (urinary tract  infection) 04/11/2018   Altered mental status 04/10/2018   Anxiety 04/10/2018   Hyponatremia 04/10/2018   UTI (urinary tract infection) due to urinary indwelling catheter (HCC) 04/10/2018   Chronic constipation 04/09/2018   Urinary retention 04/09/2018   Closed right hip fracture (HCC) 04/05/2018   GERD (gastroesophageal reflux disease) 04/05/2018   COPD with acute exacerbation (HCC) 04/05/2018   Hiatal hernia    Dyspepsia 04/04/2014   Hematochezia 04/30/2011   RUQ pain 03/25/2011   Adenomatous polyps 03/25/2011   ABDOMINAL PAIN, LEFT LOWER QUADRANT 10/16/2009   HYPERLIPIDEMIA 10/09/2009   BRONCHITIS 10/09/2009   Osteoporosis 10/09/2009   Personal history of pneumonia (recurrent) 01/05/1999   Mixed hyperlipidemia 01/05/1999   Home Medication(s) Prior to Admission medications   Medication Sig Start Date End Date Taking? Authorizing Provider  acetaminophen (TYLENOL) 500 MG tablet Take 1 tablet (500 mg total) by mouth every 8 (eight) hours as needed. Patient taking differently: Take 500 mg by mouth every 8 (eight) hours as needed for mild pain or moderate pain. 08/26/22   Benjiman Core, MD  albuterol (PROAIR HFA) 108 (90 Base) MCG/ACT inhaler Inhale 2 puffs into the lungs every 4 (four) hours as needed for wheezing or shortness of breath. Patient taking differently: Inhale 2 puffs into the lungs every 6 (six) hours as needed for wheezing or shortness of breath. 06/12/21   Johnson, Clanford L, MD  ALPRAZolam Prudy Feeler) 0.5 MG tablet Take 1 tablet (0.5 mg total) by mouth 2 (two) times daily. 07/29/22   Catarina Hartshorn, MD  CRANBERRY PO Take by mouth.    [provider]  diclofenac Sodium (VOLTAREN) 1 % GEL APPLY AS DIRECTED TO THE AFFECTED KNEE FOUR TIMES DAILY. Patient taking differently: Apply 1 Application topically 4 (four) times daily as needed (pain). 08/04/20   Vickki Hearing, MD  donepezil (ARICEPT) 10 MG tablet Take 10 mg by mouth daily. 07/25/19   [provider]   fluticasone furoate-vilanterol (BREO ELLIPTA) 100-25 MCG/ACT AEPB Inhale 1 puff into the lungs every morning.    [provider]  furosemide (LASIX) 40 MG tablet Take 40 mg by mouth daily.    [provider]  ipratropium-albuterol (DUONEB) 0.5-2.5 (3) MG/3ML SOLN Take 3 mLs by nebulization in the morning, at noon, in the evening, and at bedtime.    [provider]  lidocaine (LIDODERM) 5 % Place 1 patch onto the skin daily as needed. Remove & Discard patch within 12 hours or as directed by MD Patient not taking: Reported on 09/23/2022 07/29/22   Catarina Hartshorn, MD  LINZESS 145 MCG CAPS capsule Take 145 mcg by mouth daily as needed (bowel movement). 05/22/21   [provider]  loratadine (CLARITIN) 10 MG tablet Take 10 mg by mouth daily. 08/29/21   [provider]  memantine (NAMENDA) 10 MG tablet Take 10 mg by mouth 2 (two) times daily.    [provider]  Multiple Vitamins-Minerals (ONE A DAY WOMEN 50 PLUS PO) Take 1 tablet by mouth daily.    [provider]  pantoprazole (PROTONIX) 40 MG tablet TAKE (1) TABLET BY MOUTH TWICE A DAY WITH MEALS (BREAKFAST AND SUPPER) Patient taking differently: Take 40 mg by mouth 2 (two) times daily before a meal. 05/24/19   Anice Paganini, NP  potassium  chloride (KLOR-CON) 10 MEQ tablet Take 10 mEq by mouth daily.    [provider]  simvastatin (ZOCOR) 10 MG tablet Take 10 mg by mouth daily. 02/27/21   [provider]  traMADol (ULTRAM) 50 MG tablet Take 1 tablet (50 mg total) by mouth every 6 (six) hours as needed for moderate pain or severe pain. Patient taking differently: Take 50 mg by mouth every 4 (four) hours. 07/29/22   Catarina Hartshorn, MD  zolpidem (AMBIEN) 5 MG tablet Take 1 tablet (5 mg total) by mouth at bedtime. 07/29/22   Catarina Hartshorn, MD                                                                                                                                    Past Surgical  History Past Surgical History:  Procedure Laterality Date   COLONOSCOPY  10/04/2009   sigmoid diverticula, multiple tubulovillous adneomas, needs surveillance Oct 2013   ESOPHAGOGASTRODUODENOSCOPY  04/16/2011   Dr. Jena Gauss: Noncritical Schatzkis ring( not manipulated because no dysphagia). Normal esophagus otherwise  large hiatal hernia. Gastric polyp-status post biopsy. Gastric erosions-staus post biopsy. Abnormal bulb-status post biopsy. benign small bowel, stomach biopsy with ulcerated gastric antral mucosa with foveolar hyperplasia and surface erosion, polyp with inflamed gastric antral mucosa    ESOPHAGOGASTRODUODENOSCOPY N/A 04/29/2014   Dr. Jena Gauss: Noncritical Schatzki's ring large hiatal hernia. Retained gastric contents.GES with slight delay   femur fracture Left 2023   HIP ARTHROPLASTY Left 09/24/2021   Procedure: ARTHROPLASTY BIPOLAR HIP (HEMIARTHROPLASTY);  Surgeon: Oliver Barre, MD;  Location: [facility];  Service: Orthopedics;  Laterality: Left;   HIP PINNING,CANNULATED Right 04/06/2018   Procedure: CANNULATED HIP PINNING;  Surgeon: Vickki Hearing, MD;  Location: [facility];  Service: Orthopedics;  Laterality: Right;   Family History Family History  Problem Relation Age of Onset   Stroke Mother    Diabetes Mother    Heart disease Father    Colon cancer Neg Hx     Social History Social History   Tobacco Use   Smoking status: Former    Types: Cigarettes    Passive exposure: Yes   Smokeless tobacco: Current    Types: Snuff  Vaping Use   Vaping status: Never Used  Substance Use Topics   Alcohol use: No   Drug use: No   Allergies Codeine, Neurontin [gabapentin], Sulfonamide derivatives, Aloe, Levaquin [levofloxacin], Morphine, Septra [sulfamethoxazole-trimethoprim], Sulfa antibiotics, Trimethoprim, and Vibra-tab [doxycycline]  Review of Systems Review of Systems  Constitutional:  Positive for fatigue.  Respiratory:  Positive for shortness of breath.     Physical  Exam Vital Signs  I have reviewed the triage vital signs BP 113/75 (BP Location: Right Arm)   Pulse 84   Temp 97.8 F (36.6 C) (Oral)   Wt 47.6 kg   SpO2 94%   BMI 23.54 kg/m   Physical Exam Vitals and nursing note reviewed.  Constitutional:  General: She is not in acute distress.    Appearance: She is well-developed.  HENT:     Head: Normocephalic and atraumatic.  Eyes:     Conjunctiva/sclera: Conjunctivae normal.  Cardiovascular:     Rate and Rhythm: Normal rate and regular rhythm.     Heart sounds: No murmur heard. Pulmonary:     Effort: Pulmonary effort is normal. No respiratory distress.     Breath sounds: Normal breath sounds.  Abdominal:     Palpations: Abdomen is soft.     Tenderness: There is no abdominal tenderness.  Musculoskeletal:        General: No swelling.     Cervical back: Neck supple.  Skin:    General: Skin is warm and dry.     Capillary Refill: Capillary refill takes less than 2 seconds.  Neurological:     Mental Status: She is alert.  Psychiatric:        Mood and Affect: Mood normal.     ED Results and Treatments Labs (all labs ordered are listed, but only abnormal results are displayed) Labs Reviewed  COMPREHENSIVE METABOLIC PANEL  CBC WITH DIFFERENTIAL/PLATELET  URINALYSIS, ROUTINE W REFLEX MICROSCOPIC  BLOOD GAS, VENOUS  BRAIN NATRIURETIC PEPTIDE  TROPONIN I (HIGH SENSITIVITY)                                                                                                                          Radiology No results found.  Pertinent labs & imaging results that were available during my care of the patient were reviewed by me and considered in my medical decision making (see MDM for details).  Medications Ordered in ED Medications - No data to display                                                                                                                                   Procedures Procedures  (including critical  care time)  Medical Decision Making / ED Course   This patient presents to the ED for concern of dyspnea on exertion, generalized weakness, this involves an extensive number of treatment options, and is a complaint that carries with it a high risk of complications and morbidity.  The differential diagnosis includes Pe, PTX, Pulmonary Edema, ARDS, COPD/Asthma, ACS, CHF exacerbation, Arrhythmia, Pericardial Effusion/Tamponade, Anemia, Sepsis, Acidosis/Hypercapnia, Anxiety, Viral URI  MDM: Patient seen emergency room for evaluation of shortness of  breath on exertion.  Physical exam largely unremarkable.  Laboratory evaluation with a hemoglobin of 10.0, pH normal, new AKI with BUN 29, creatinine 1.57, potassium 5.6, BNP 117, urinalysis unremarkable.  High-sensitivity troponin unremarkable.  COVID-negative.  Chest x-ray concerning for superimposed pneumonia over interstitial lung disease.  With associated AKI patient will require hospital admission and patient started on ceftriaxone azithromycin.  Patient admitted   Additional history obtained: -Additional history obtained from multiple family members -External records from outside source obtained and reviewed including: Chart review including previous notes, labs, imaging, consultation notes   Lab Tests: -I ordered, reviewed, and interpreted labs.   The pertinent results include:   Labs Reviewed  COMPREHENSIVE METABOLIC PANEL  CBC WITH DIFFERENTIAL/PLATELET  URINALYSIS, ROUTINE W REFLEX MICROSCOPIC  BLOOD GAS, VENOUS  BRAIN NATRIURETIC PEPTIDE  TROPONIN I (HIGH SENSITIVITY)     Imaging Studies ordered: I ordered imaging studies including chest x-ray I independently visualized and interpreted imaging. I agree with the radiologist interpretation   Medicines ordered and prescription drug management: No orders of the defined types were placed in this encounter.   -I have reviewed the patients home medicines and have made adjustments as  needed  Critical interventions none   Cardiac Monitoring: The patient was maintained on a cardiac monitor.  I personally viewed and interpreted the cardiac monitored which showed an underlying rhythm of: NSR  Social Determinants of Health:  Factors impacting patients care include: none   Reevaluation: After the interventions noted above, I reevaluated the patient and found that they have :improved  Co morbidities that complicate the patient evaluation  Past Medical History:  Diagnosis Date   Anxiety    Chronic diastolic (congestive) heart failure (HCC)    Chronic obstructive pulmonary disease, unspecified (HCC)    COPD (chronic obstructive pulmonary disease) (HCC)    GERD (gastroesophageal reflux disease)    Hyperlipidemia    IBS (irritable bowel syndrome)    Lumbago with sciatica    Osteoporosis    Overactive bladder    Pneumonia    Pulmonary fibrosis (HCC)       Dispostion: I considered admission for this patient, and patient require hospital admission for AKI and new pneumonia     Final Clinical Impression(s) / ED Diagnoses Final diagnoses:  None     @PCDICTATION @    Glendora Score, MD 11/28/22 [phone]

## 2022-11-28 ENCOUNTER — Encounter (HOSPITAL_COMMUNITY): Payer: Self-pay | Admitting: Family Medicine

## 2022-11-28 DIAGNOSIS — J9601 Acute respiratory failure with hypoxia: Secondary | ICD-10-CM | POA: Diagnosis present

## 2022-11-28 DIAGNOSIS — Z882 Allergy status to sulfonamides status: Secondary | ICD-10-CM | POA: Diagnosis not present

## 2022-11-28 DIAGNOSIS — J189 Pneumonia, unspecified organism: Secondary | ICD-10-CM

## 2022-11-28 DIAGNOSIS — F0390 Unspecified dementia without behavioral disturbance: Secondary | ICD-10-CM | POA: Diagnosis present

## 2022-11-28 DIAGNOSIS — I110 Hypertensive heart disease with heart failure: Secondary | ICD-10-CM | POA: Diagnosis present

## 2022-11-28 DIAGNOSIS — Z8249 Family history of ischemic heart disease and other diseases of the circulatory system: Secondary | ICD-10-CM | POA: Diagnosis not present

## 2022-11-28 DIAGNOSIS — N179 Acute kidney failure, unspecified: Principal | ICD-10-CM | POA: Insufficient documentation

## 2022-11-28 DIAGNOSIS — Z885 Allergy status to narcotic agent status: Secondary | ICD-10-CM | POA: Diagnosis not present

## 2022-11-28 DIAGNOSIS — I5032 Chronic diastolic (congestive) heart failure: Secondary | ICD-10-CM | POA: Diagnosis present

## 2022-11-28 DIAGNOSIS — Z66 Do not resuscitate: Secondary | ICD-10-CM | POA: Diagnosis present

## 2022-11-28 DIAGNOSIS — E782 Mixed hyperlipidemia: Secondary | ICD-10-CM

## 2022-11-28 DIAGNOSIS — Z79899 Other long term (current) drug therapy: Secondary | ICD-10-CM | POA: Diagnosis not present

## 2022-11-28 DIAGNOSIS — E785 Hyperlipidemia, unspecified: Secondary | ICD-10-CM

## 2022-11-28 DIAGNOSIS — Z96642 Presence of left artificial hip joint: Secondary | ICD-10-CM | POA: Diagnosis present

## 2022-11-28 DIAGNOSIS — J449 Chronic obstructive pulmonary disease, unspecified: Secondary | ICD-10-CM

## 2022-11-28 DIAGNOSIS — Z833 Family history of diabetes mellitus: Secondary | ICD-10-CM | POA: Diagnosis not present

## 2022-11-28 DIAGNOSIS — J438 Other emphysema: Secondary | ICD-10-CM | POA: Diagnosis not present

## 2022-11-28 DIAGNOSIS — J8410 Pulmonary fibrosis, unspecified: Secondary | ICD-10-CM | POA: Diagnosis present

## 2022-11-28 DIAGNOSIS — Z1152 Encounter for screening for COVID-19: Secondary | ICD-10-CM | POA: Diagnosis not present

## 2022-11-28 DIAGNOSIS — K219 Gastro-esophageal reflux disease without esophagitis: Secondary | ICD-10-CM

## 2022-11-28 DIAGNOSIS — Z823 Family history of stroke: Secondary | ICD-10-CM | POA: Diagnosis not present

## 2022-11-28 DIAGNOSIS — E875 Hyperkalemia: Secondary | ICD-10-CM | POA: Insufficient documentation

## 2022-11-28 DIAGNOSIS — M810 Age-related osteoporosis without current pathological fracture: Secondary | ICD-10-CM | POA: Diagnosis present

## 2022-11-28 DIAGNOSIS — F17290 Nicotine dependence, other tobacco product, uncomplicated: Secondary | ICD-10-CM | POA: Diagnosis present

## 2022-11-28 DIAGNOSIS — K580 Irritable bowel syndrome with diarrhea: Secondary | ICD-10-CM | POA: Diagnosis present

## 2022-11-28 DIAGNOSIS — Z888 Allergy status to other drugs, medicaments and biological substances status: Secondary | ICD-10-CM | POA: Diagnosis not present

## 2022-11-28 DIAGNOSIS — D649 Anemia, unspecified: Secondary | ICD-10-CM | POA: Diagnosis present

## 2022-11-28 LAB — CBC WITH DIFFERENTIAL/PLATELET
Abs Immature Granulocytes: 0.01 10*3/uL (ref 0.00–0.07)
Basophils Absolute: 0.1 10*3/uL (ref 0.0–0.1)
Basophils Relative: 1 %
Eosinophils Absolute: 0.3 10*3/uL (ref 0.0–0.5)
Eosinophils Relative: 4 %
HCT: 30.3 % — ABNORMAL LOW (ref 36.0–46.0)
Hemoglobin: 9.3 g/dL — ABNORMAL LOW (ref 12.0–15.0)
Immature Granulocytes: 0 %
Lymphocytes Relative: 18 %
Lymphs Abs: 1.1 10*3/uL (ref 0.7–4.0)
MCH: 27.5 pg (ref 26.0–34.0)
MCHC: 30.7 g/dL (ref 30.0–36.0)
MCV: 89.6 fL (ref 80.0–100.0)
Monocytes Absolute: 1 10*3/uL (ref 0.1–1.0)
Monocytes Relative: 16 %
Neutro Abs: 3.7 10*3/uL (ref 1.7–7.7)
Neutrophils Relative %: 61 %
Platelets: 186 10*3/uL (ref 150–400)
RBC: 3.38 MIL/uL — ABNORMAL LOW (ref 3.87–5.11)
RDW: 14.6 % (ref 11.5–15.5)
WBC: 6.1 10*3/uL (ref 4.0–10.5)
nRBC: 0 % (ref 0.0–0.2)

## 2022-11-28 LAB — MAGNESIUM: Magnesium: 2.6 mg/dL — ABNORMAL HIGH (ref 1.7–2.4)

## 2022-11-28 LAB — COMPREHENSIVE METABOLIC PANEL
ALT: 15 U/L (ref 0–44)
AST: 23 U/L (ref 15–41)
Albumin: 3 g/dL — ABNORMAL LOW (ref 3.5–5.0)
Alkaline Phosphatase: 72 U/L (ref 38–126)
Anion gap: 8 (ref 5–15)
BUN: 24 mg/dL — ABNORMAL HIGH (ref 8–23)
CO2: 28 mmol/L (ref 22–32)
Calcium: 8.6 mg/dL — ABNORMAL LOW (ref 8.9–10.3)
Chloride: 100 mmol/L (ref 98–111)
Creatinine, Ser: 1.22 mg/dL — ABNORMAL HIGH (ref 0.44–1.00)
GFR, Estimated: 43 mL/min — ABNORMAL LOW (ref >60)
Glucose, Bld: 100 mg/dL — ABNORMAL HIGH (ref 70–99)
Potassium: 5 mmol/L (ref 3.5–5.1)
Sodium: 136 mmol/L (ref 135–145)
Total Bilirubin: 0.5 mg/dL (ref <1.2)
Total Protein: 6.1 g/dL — ABNORMAL LOW (ref 6.5–8.1)

## 2022-11-28 LAB — PROCALCITONIN: Procalcitonin: <0.1 ng/mL

## 2022-11-28 MED ORDER — ONDANSETRON HCL 4 MG PO TABS: 4.0000 mg | ORAL_TABLET | Freq: Four times a day (QID) | ORAL | Status: DC | PRN

## 2022-11-28 MED ORDER — ONDANSETRON HCL 4 MG/2ML IJ SOLN: 4.0000 mg | Freq: Four times a day (QID) | INTRAMUSCULAR | Status: DC | PRN

## 2022-11-28 MED ORDER — SODIUM CHLORIDE 0.9 % IV SOLN: INTRAVENOUS | Status: DC

## 2022-11-28 MED ORDER — FLUTICASONE FUROATE-VILANTEROL 100-25 MCG/ACT IN AEPB
1.0000 | INHALATION_SPRAY | Freq: Every day | RESPIRATORY_TRACT | Status: DC
Start: 2022-11-28 — End: 2022-12-01
  Administered 2022-11-28 – 2022-12-01 (×4): 1 via RESPIRATORY_TRACT
  Filled 2022-11-28: qty 28

## 2022-11-28 MED ORDER — ACETAMINOPHEN 325 MG PO TABS
650.0000 mg | ORAL_TABLET | Freq: Four times a day (QID) | ORAL | Status: DC | PRN
  Administered 2022-11-29: 650 mg via ORAL
  Filled 2022-11-28: qty 2

## 2022-11-28 MED ORDER — HEPARIN SODIUM (PORCINE) 5000 UNIT/ML IJ SOLN
5000.0000 [IU] | Freq: Three times a day (TID) | INTRAMUSCULAR | Status: DC
  Administered 2022-11-28 – 2022-12-01 (×10): 5000 [IU] via SUBCUTANEOUS
  Filled 2022-11-28 (×10): qty 1

## 2022-11-28 MED ORDER — SIMVASTATIN 20 MG PO TABS
10.0000 mg | ORAL_TABLET | Freq: Every day | ORAL | Status: DC
  Administered 2022-11-28 – 2022-12-01 (×4): 10 mg via ORAL
  Filled 2022-11-28 (×5): qty 1

## 2022-11-28 MED ORDER — IPRATROPIUM-ALBUTEROL 0.5-2.5 (3) MG/3ML IN SOLN
3.0000 mL | Freq: Four times a day (QID) | RESPIRATORY_TRACT | Status: DC
  Administered 2022-11-28 – 2022-11-29 (×5): 3 mL via RESPIRATORY_TRACT
  Filled 2022-11-28 (×5): qty 3

## 2022-11-28 MED ORDER — OXYCODONE HCL 5 MG PO TABS: 5.0000 mg | ORAL_TABLET | ORAL | Status: DC | PRN

## 2022-11-28 MED ORDER — METHYLPREDNISOLONE SODIUM SUCC 40 MG IJ SOLR
40.0000 mg | Freq: Two times a day (BID) | INTRAMUSCULAR | Status: AC
Start: 2022-11-28 — End: 2022-11-30
  Administered 2022-11-28 – 2022-11-30 (×5): 40 mg via INTRAVENOUS
  Filled 2022-11-28 (×5): qty 1

## 2022-11-28 MED ORDER — MEMANTINE HCL 10 MG PO TABS
10.0000 mg | ORAL_TABLET | Freq: Two times a day (BID) | ORAL | Status: DC
  Administered 2022-11-28 – 2022-12-01 (×7): 10 mg via ORAL
  Filled 2022-11-28 (×8): qty 1

## 2022-11-28 MED ORDER — ALBUTEROL SULFATE (2.5 MG/3ML) 0.083% IN NEBU
2.5000 mg | INHALATION_SOLUTION | RESPIRATORY_TRACT | Status: DC | PRN
  Administered 2022-11-30: 2.5 mg via RESPIRATORY_TRACT
  Filled 2022-11-28: qty 3

## 2022-11-28 MED ORDER — ALPRAZOLAM 0.5 MG PO TABS
0.5000 mg | ORAL_TABLET | Freq: Two times a day (BID) | ORAL | Status: DC | PRN
  Administered 2022-11-28 – 2022-11-30 (×5): 0.5 mg via ORAL
  Filled 2022-11-28 (×5): qty 1

## 2022-11-28 MED ORDER — ACETAMINOPHEN 650 MG RE SUPP: 650.0000 mg | Freq: Four times a day (QID) | RECTAL | Status: DC | PRN

## 2022-11-28 MED ORDER — SODIUM CHLORIDE 0.9 % IV SOLN
2.0000 g | INTRAVENOUS | Status: AC
  Administered 2022-11-28: 2 g via INTRAVENOUS
  Filled 2022-11-28: qty 20

## 2022-11-28 MED ORDER — DONEPEZIL HCL 5 MG PO TABS
10.0000 mg | ORAL_TABLET | Freq: Every day | ORAL | Status: DC
  Administered 2022-11-28 – 2022-12-01 (×4): 10 mg via ORAL
  Filled 2022-11-28 (×5): qty 2

## 2022-11-28 MED ORDER — SODIUM CHLORIDE 0.9 % IV SOLN
500.0000 mg | INTRAVENOUS | Status: AC
  Administered 2022-11-28: 500 mg via INTRAVENOUS
  Filled 2022-11-28: qty 5

## 2022-11-28 MED ORDER — PANTOPRAZOLE SODIUM 40 MG PO TBEC
40.0000 mg | DELAYED_RELEASE_TABLET | Freq: Two times a day (BID) | ORAL | Status: DC
  Administered 2022-11-28 – 2022-12-01 (×7): 40 mg via ORAL
  Filled 2022-11-28 (×8): qty 1

## 2022-11-28 MED ORDER — DM-GUAIFENESIN ER 30-600 MG PO TB12
1.0000 | ORAL_TABLET | Freq: Two times a day (BID) | ORAL | Status: DC
  Administered 2022-11-28 – 2022-12-01 (×7): 1 via ORAL
  Filled 2022-11-28 (×8): qty 1

## 2022-11-28 MED ORDER — FENTANYL CITRATE PF 50 MCG/ML IJ SOSY: 12.5000 ug | PREFILLED_SYRINGE | INTRAMUSCULAR | Status: DC | PRN

## 2022-11-28 MED ORDER — ZOLPIDEM TARTRATE 5 MG PO TABS
5.0000 mg | ORAL_TABLET | Freq: Every day | ORAL | Status: DC
  Administered 2022-11-28 – 2022-11-30 (×3): 5 mg via ORAL
  Filled 2022-11-28 (×3): qty 1

## 2022-11-28 NOTE — Assessment & Plan Note (Signed)
Continue PPI ?

## 2022-11-28 NOTE — Assessment & Plan Note (Signed)
-   Creatinine increased, 0.89>> 1.57 - 1 L bolus given in ED - Continue gentle IV hydration - Recheck creatinine

## 2022-11-28 NOTE — Progress Notes (Signed)
Has been alert and oriented today with frequent requests. Sat up in chair for several hours today even though did not want to.  An assist of two to stand and pivot.  Very hard of hearing. Weaned from 2 liters and O2 sat in mid 90's on room air. Family at bedside off and on today.

## 2022-11-28 NOTE — Progress Notes (Addendum)
PROGRESS NOTE  [name], is a 88 y.o. female, DOB - Feb 03, 1934, NWG:[id number]  Admit date - 11/27/2022   Admitting Physician Lacey Malerba Mariea Clonts, MD  Outpatient Primary MD for the patient is Lacey Ferrier, MD  LOS - 0  Chief Complaint  Patient presents with   Weakness      Brief Narrative:  88 y.o. female with medical history significant of anxiety, CHF, COPD, GERD, hyperlipidemia, pulmonary fibrosis/ILD admitted on 11/28/2022 with acute hypoxic respiratory failure    -Assessment and Plan: 1)Pulmonary fibrosis/ILD flareup--- cough, congestion, tachypnea and dyspnea persist  IV Solu-Medrol bronchodilators with mucolytics as ordered CT chest with contrast without definite pneumonia -PCT < 0.10, WBC 6.5  -Stop Rocephin and azithro  2) acute hypoxic respiratory failure -Due to #1 above -Management as above #1 -Unable to wean off O2 at this time---requiring 2 L via nasal cannula  3)AKI----acute kidney injury with hyperkalemia - creatinine on admission=1.57  ,  baseline creatinine= 0.89 (08/29/22)   ,  -creatinine is now= 1.22 ,  -Renal function improving with hydration -Hyperkalemia resolved --renally adjust medications, avoid nephrotoxic agents / dehydration  / hypotension  4)Hyperlipidemia, unspecified - Continue simvastatin  5)Gastro-esophageal reflux disease without esophagitis - Continue Protonix especially while on high-dose steroids  6) chronic anemia--Hgb is not far from baseline -No bleeding concerns, monitor closely  -Total care time is 53 minutes  Status is: Inpatient   Disposition: The patient is from: Home              Anticipated d/c is to: Home              Anticipated d/c date is: 2 days              Patient currently is not medically stable to d/c. Barriers: Not Clinically Stable-   Code Status :  -  Code Status: Limited: Do not attempt resuscitation (DNR) -DNR-LIMITED -Do Not Intubate/DNI    Family Communication:    (patient is alert, awake  and coherent)  DVT Prophylaxis  :   - SCDs  heparin injection 5,000 Units Start: 11/28/22 0600 SCDs Start: 11/28/22 0307   Lab Results  Component Value Date   PLT 186 11/28/2022    Inpatient Medications  Scheduled Meds:  dextromethorphan-guaiFENesin  1 tablet Oral BID   donepezil  10 mg Oral Daily   fluticasone furoate-vilanterol  1 puff Inhalation Daily   heparin  5,000 Units Subcutaneous Q8H   ipratropium-albuterol  3 mL Nebulization Q6H   memantine  10 mg Oral BID   methylPREDNISolone (SOLU-MEDROL) injection  40 mg Intravenous Q12H   pantoprazole  40 mg Oral BID AC   simvastatin  10 mg Oral Daily   zolpidem  5 mg Oral QHS   Continuous Infusions:  sodium chloride     azithromycin     cefTRIAXone (ROCEPHIN)  IV     PRN Meds:.acetaminophen **OR** acetaminophen, albuterol, ALPRAZolam, fentaNYL (SUBLIMAZE) injection, ondansetron **OR** ondansetron (ZOFRAN) IV, oxyCODONE   Anti-infectives (From admission, onward)    Start     Dose/Rate Route Frequency Ordered Stop   11/28/22 2200  azithromycin (ZITHROMAX) 500 mg in sodium chloride 0.9 % 250 mL IVPB        500 mg 250 mL/hr over 60 Minutes Intravenous Every 24 hours 11/28/22 1245 11/28/22 2359   11/28/22 2100  cefTRIAXone (ROCEPHIN) 2 g in sodium chloride 0.9 % 100 mL IVPB        2 g 200 mL/hr over 30 Minutes Intravenous  Every 24 hours 11/28/22 0306 11/28/22 2359   11/27/22 2115  cefTRIAXone (ROCEPHIN) 2 g in sodium chloride 0.9 % 100 mL IVPB        2 g 200 mL/hr over 30 Minutes Intravenous  Once 11/27/22 2105 11/27/22 2146   11/27/22 2115  azithromycin (ZITHROMAX) 500 mg in sodium chloride 0.9 % 250 mL IVPB  Status:  Discontinued        500 mg 250 mL/hr over 60 Minutes Intravenous Every 24 hours 11/27/22 2105 11/28/22 1245         Subjective: [name] today has no fevers, no emesis,  No chest pain,  Daughter visited -Cough, dyspnea, tachypnea and hypoxia persist -Appetite is not great  Objective: Vitals:    11/28/22 0256 11/28/22 0722 11/28/22 1100 11/28/22 1249  BP: (!) 154/80  (!) 154/88   Pulse: 82  73   Resp: 17  19   Temp: 97.8 F (36.6 C)  97.8 F (36.6 C)   TempSrc: Oral     SpO2: 100% 98% 100% 100%  Weight: 51.1 kg     Height: 4\' 8"  (1.422 m)       Intake/Output Summary (Last 24 hours) at 11/28/2022 1250 Last data filed at 11/28/2022 0400 Gross per 24 hour  Intake 1205.99 ml  Output --  Net 1205.99 ml   Filed Weights   11/27/22 1940 11/28/22 0256  Weight: 47.6 kg 51.1 kg    Physical Exam  Gen:- Awake Alert, no conversational dyspnea HEENT:- [name].AT, No sclera icterus Nose- [name] 2L/min Neck-Supple Neck,No JVD,.  Lungs--diminished breath sounds, few scattered wheezes CV- S1, S2 normal, regular  Abd-  +ve B.Sounds, Abd Soft, No tenderness,    Extremity/Skin:- No  edema, pedal pulses present  Psych-affect is appropriate, oriented x3 Neuro-generalized weakness, no new focal deficits, no tremors  Data Reviewed: I have personally reviewed following labs and imaging studies  CBC: Recent Labs  Lab 11/27/22 2101 11/28/22 0453  WBC 6.5 6.1  NEUTROABS 4.1 3.7  HGB 10.0* 9.3*  HCT 32.3* 30.3*  MCV 91.5 89.6  PLT 213 186   Basic Metabolic Panel: Recent Labs  Lab 11/27/22 2101 11/28/22 0453  NA 137 136  K 5.6* 5.0  CL 101 100  CO2 30 28  GLUCOSE 82 100*  BUN 29* 24*  CREATININE 1.57* 1.22*  CALCIUM 8.7* 8.6*  MG  --  2.6*   GFR: Estimated Creatinine Clearance: 22.1 mL/min (A) (by C-G formula based on SCr of 1.22 mg/dL (H)). Liver Function Tests: Recent Labs  Lab 11/27/22 2101 11/28/22 0453  AST 24 23  ALT 17 15  ALKPHOS 87 72  BILITOT 0.4 0.5  PROT 6.8 6.1*  ALBUMIN 3.5 3.0*   Recent Results (from the past 240 hour(s))  SARS Coronavirus 2 by RT PCR (hospital order, performed in [facility] hospital lab) *cepheid single result test* Anterior Nasal Swab     Status: None   Collection Time: 11/27/22 10:42 PM   Specimen: Anterior Nasal Swab  Result  Value Ref Range Status   SARS Coronavirus 2 by RT PCR NEGATIVE NEGATIVE Final    Comment: (NOTE) SARS-CoV-2 target nucleic acids are NOT DETECTED.  The SARS-CoV-2 RNA is generally detectable in upper and lower respiratory specimens during the acute phase of infection. The lowest concentration of SARS-CoV-2 viral copies this assay can detect is 250 copies / mL. A negative result does not preclude SARS-CoV-2 infection and should not be used as the sole basis for treatment or other patient  management decisions.  A negative result may occur with improper specimen collection / handling, submission of specimen other than nasopharyngeal swab, presence of viral mutation(s) within the areas targeted by this assay, and inadequate number of viral copies (<250 copies / mL). A negative result must be combined with clinical observations, patient history, and epidemiological information.  Fact Sheet for Patients:   RoadLapTop.co.za  Fact Sheet for Healthcare Providers: http://kim-miller.com/  This test is not yet approved or  cleared by the [place] FDA and has been authorized for detection and/or diagnosis of SARS-CoV-2 by FDA under an Emergency Use Authorization (EUA).  This EUA will remain in effect (meaning this test can be used) for the duration of the COVID-19 declaration under Section 564(b)(1) of the Act, 21 U.S.C. section 360bbb-3(b)(1), unless the authorization is terminated or revoked sooner.  Performed at [facility], [address], [place], [place] [address]     Radiology Studies: CT Chest W Contrast  Result Date: 11/27/2022 CLINICAL DATA:  Pneumonia. EXAM: CT CHEST WITH CONTRAST TECHNIQUE: Multidetector CT imaging of the chest was performed during intravenous contrast administration. RADIATION DOSE REDUCTION: This exam was performed according to the departmental dose-optimization program which includes automated exposure control,  adjustment of the mA and/or kV according to patient size and/or use of iterative reconstruction technique. CONTRAST:  60mL OMNIPAQUE IOHEXOL 300 MG/ML  SOLN COMPARISON:  CT angiogram chest 04/23/2022 FINDINGS: Cardiovascular: Aorta is ectatic as seen on the prior study. No evidence for aneurysm. Heart is normal in size. There is no pericardial effusion. There are atherosclerotic calcifications of the aorta and coronary arteries. Mediastinum/Nodes: There is a large hiatal hernia with intrathoracic stomach. The stomach appears nondilated. Esophagus is nondilated. Visualized thyroid gland is within normal limits. No enlarged lymph nodes are seen. Lungs/Pleura: There is a fat containing Bochdalek hernia in the left lung base, unchanged. Peripheral reticular opacities are again seen bilaterally in the mid and lower lungs similar to prior. Minimal fibrotic changes in the left lung base are also similar to prior. There is no lung consolidation, pleural effusion or pneumothorax. Upper Abdomen: There is a punctate nonobstructing left renal calculus. Musculoskeletal: There are healed left-sided rib fractures. No acute fractures are identified. The bones are diffusely osteopenic. Compression deformities of T3, T6, T7, T8, T9, T11, L1 and L2 appears similar to the prior study. There is accentuated kyphosis, also unchanged. IMPRESSION: 1. No acute cardiopulmonary process. 2. Stable large hiatal hernia with intrathoracic stomach. 3. Stable peripheral reticular opacities in the mid and lower lungs bilaterally. Findings are nonspecific but can be seen in the setting of interstitial lung disease. 4. Stable fat containing Bochdalek hernia in the left lung base. 5. Stable compression deformities of the thoracic and lumbar spine. 6. Aortic atherosclerosis. Aortic Atherosclerosis (ICD10-I70.0). Electronically Signed   By: Darliss Cheney M.D.   On: 11/27/2022 22:35   DG Chest 2 View  Result Date: 11/27/2022 CLINICAL DATA:  Dyspnea  EXAM: CHEST - 2 VIEW COMPARISON:  08/27/2022 FINDINGS: Stable cardiomediastinal silhouette. Aortic atherosclerotic calcification. Chronic bronchitic change and interstitial coarsening. This is slightly increased in the right mid and lower lung compared to 08/27/2022. No definite pleural effusion or pneumothorax. Bilateral remote rib fractures. Remote right clavicle fracture. Thoracic kyphosis with multiple midthoracic vertebral body compression fractures grossly similar to 04/23/2022. IMPRESSION: Increased interstitial coarsening in the right mid and lower lung compared to 08/27/2022 suspicious for infection superimposed on a background of interstitial lung disease. Electronically Signed   By: Angelique Holm.D.  On: 11/27/2022 20:52    Scheduled Meds:  dextromethorphan-guaiFENesin  1 tablet Oral BID   donepezil  10 mg Oral Daily   fluticasone furoate-vilanterol  1 puff Inhalation Daily   heparin  5,000 Units Subcutaneous Q8H   ipratropium-albuterol  3 mL Nebulization Q6H   memantine  10 mg Oral BID   methylPREDNISolone (SOLU-MEDROL) injection  40 mg Intravenous Q12H   pantoprazole  40 mg Oral BID AC   simvastatin  10 mg Oral Daily   zolpidem  5 mg Oral QHS   Continuous Infusions:  sodium chloride     azithromycin     cefTRIAXone (ROCEPHIN)  IV       LOS: 0 days   Shon Hale M.D on 11/28/2022 at 12:50 PM  Go to [email or web address] - for contact info  [facility] - Office  [phone]  If 7PM-7AM, please contact night-coverage [email or web address] 11/28/2022, 12:50 PM

## 2022-11-28 NOTE — Assessment & Plan Note (Signed)
-   Requiring 2 L nasal cannula - No oxygen at baseline per her report - Patient does have interstitial lung disease which could be contributing, pneumonia may be contributing but workup is seeming less likely - Continue albuterol as needed - Continue  Breo - Continue to monitor

## 2022-11-28 NOTE — Progress Notes (Signed)
Patient has large cyst behind left ear. Patient states it has been there for years and that her primary provider is aware.

## 2022-11-28 NOTE — Assessment & Plan Note (Signed)
Continue statin. 

## 2022-11-28 NOTE — ED Notes (Signed)
ED TO INPATIENT HANDOFF REPORT  ED Nurse Name and Phone #: Claris Gower [phone]  S Name/Age/Gender [name] 88 y.o. female Room/Bed: APA06/APA06  Code Status   Code Status: Limited: Do not attempt resuscitation (DNR) -DNR-LIMITED -Do Not Intubate/DNI   Home/SNF/Other Home Patient oriented to: self, place, time, and situation Is this baseline? Yes   Triage Complete: Triage complete  Chief Complaint CAP (community acquired pneumonia) [J18.9]  Triage Note Pt brought in by [facility] EMS pt thinks she has a UTI but does not complain of painful urination. Daughter told EMS staff that patient has been very weak the past several days. Pt is A&O x4.   Allergies Allergies  Allergen Reactions   Codeine Anaphylaxis, Shortness Of Breath and Rash   Neurontin [Gabapentin] Anaphylaxis   Sulfonamide Derivatives Hives and Shortness Of Breath   Aloe Other (See Comments)    Unknown reaction   Levaquin [Levofloxacin] Other (See Comments)    Pt admitted to hospital with lung problems due to this medication.   Morphine     Patient's daughter reports hypoxia requiring O2 with morphine and similar opiates.  But does okay with tramadol.   Septra [Sulfamethoxazole-Trimethoprim] Hives   Sulfa Antibiotics Hives   Trimethoprim Hives   Vibra-Tab [Doxycycline] Hives    Level of Care/Admitting Diagnosis ED Disposition     ED Disposition  Admit   Condition  --   Comment  Hospital Area: [facility] [[id number]]  Level of Care: Telemetry [5]  Covid Evaluation: Symptomatic Person Under Investigation (PUI) or recent exposure (last 10 days) *Testing Required*  Diagnosis: CAP (community acquired pneumonia) [454098]  Admitting Physician: Lilyan Gilford [[id number]]  Attending Physician: Lilyan Gilford [[id number]]          B Medical/Surgery History Past Medical History:  Diagnosis Date   Anxiety    Chronic diastolic (congestive) heart failure (HCC)    Chronic obstructive  pulmonary disease, unspecified (HCC)    COPD (chronic obstructive pulmonary disease) (HCC)    GERD (gastroesophageal reflux disease)    Hyperlipidemia    IBS (irritable bowel syndrome)    Lumbago with sciatica    Osteoporosis    Overactive bladder    Pneumonia    Pulmonary fibrosis (HCC)    Past Surgical History:  Procedure Laterality Date   COLONOSCOPY  10/04/2009   sigmoid diverticula, multiple tubulovillous adneomas, needs surveillance Oct 2013   ESOPHAGOGASTRODUODENOSCOPY  04/16/2011   Dr. Jena Gauss: Noncritical Schatzkis ring( not manipulated because no dysphagia). Normal esophagus otherwise  large hiatal hernia. Gastric polyp-status post biopsy. Gastric erosions-staus post biopsy. Abnormal bulb-status post biopsy. benign small bowel, stomach biopsy with ulcerated gastric antral mucosa with foveolar hyperplasia and surface erosion, polyp with inflamed gastric antral mucosa    ESOPHAGOGASTRODUODENOSCOPY N/A 04/29/2014   Dr. Jena Gauss: Noncritical Schatzki's ring large hiatal hernia. Retained gastric contents.GES with slight delay   femur fracture Left 2023   HIP ARTHROPLASTY Left 09/24/2021   Procedure: ARTHROPLASTY BIPOLAR HIP (HEMIARTHROPLASTY);  Surgeon: Oliver Barre, MD;  Location: [facility];  Service: Orthopedics;  Laterality: Left;   HIP PINNING,CANNULATED Right 04/06/2018   Procedure: CANNULATED HIP PINNING;  Surgeon: Vickki Hearing, MD;  Location: [facility];  Service: Orthopedics;  Laterality: Right;     A IV Location/Drains/Wounds Patient Lines/Drains/Airways Status     Active Line/Drains/Airways     Name Placement date Placement time Site Days   Peripheral IV 11/27/22 20 G Posterior;Right Forearm 11/27/22  2115  Forearm  1   Pressure  Injury 07/25/22 Toe (Comment  which one) Anterior;Right Stage 2 -  Partial thickness loss of dermis presenting as a shallow open injury with a red, pink wound bed without slough. dry healing wound.  treated by wound nurse at home  fourth toe med  07/25/22  1830  -- 126   Pressure Injury 07/25/22 Toe (Comment  which one) Right Stage 2 -  Partial thickness loss of dermis presenting as a shallow open injury with a red, pink wound bed without slough. dry small healing wound , fifth toe medial surface, treated at home by woun 07/25/22  1830  -- 126   Pressure Injury 07/25/22 Toe (Comment  which one) Left Stage 2 -  Partial thickness loss of dermis presenting as a shallow open injury with a red, pink wound bed without slough. dry healing wound, 0.5 by 0.5 cm   treated at home by wound nurse 07/25/22  1830  -- 126            Intake/Output Last 24 hours  Intake/Output Summary (Last 24 hours) at 11/28/2022 0139 Last data filed at 11/28/2022 0016 Gross per 24 hour  Intake 1000 ml  Output --  Net 1000 ml    Labs/Imaging Results for orders placed or performed during the hospital encounter of 11/27/22 (from the past 48 hour(s))  Comprehensive metabolic panel     Status: Abnormal   Collection Time: 11/27/22  9:01 PM  Result Value Ref Range   Sodium 137 135 - 145 mmol/L   Potassium 5.6 (H) 3.5 - 5.1 mmol/L   Chloride 101 98 - 111 mmol/L   CO2 30 22 - 32 mmol/L   Glucose, Bld 82 70 - 99 mg/dL    Comment: Glucose reference range applies only to samples taken after fasting for at least 8 hours.   BUN 29 (H) 8 - 23 mg/dL   Creatinine, Ser 1.61 (H) 0.44 - 1.00 mg/dL   Calcium 8.7 (L) 8.9 - 10.3 mg/dL   Total Protein 6.8 6.5 - 8.1 g/dL   Albumin 3.5 3.5 - 5.0 g/dL   AST 24 15 - 41 U/L   ALT 17 0 - 44 U/L   Alkaline Phosphatase 87 38 - 126 U/L   Total Bilirubin 0.4 <1.2 mg/dL   GFR, Estimated 32 (L) >60 mL/min    Comment: (NOTE) Calculated using the CKD-EPI Creatinine Equation (2021)    Anion gap 6 5 - 15    Comment: Performed at [facility], [address] [place], [place] [address]  CBC with Differential     Status: Abnormal   Collection Time: 11/27/22  9:01 PM  Result Value Ref Range   WBC 6.5 4.0 - 10.5 K/uL   RBC 3.53 (L)  3.87 - 5.11 MIL/uL   Hemoglobin 10.0 (L) 12.0 - 15.0 g/dL   HCT 54.0 (L) 98.1 - 19.1 %   MCV 91.5 80.0 - 100.0 fL   MCH 28.3 26.0 - 34.0 pg   MCHC 31.0 30.0 - 36.0 g/dL   RDW 47.8 29.5 - 62.1 %   Platelets 213 150 - 400 K/uL   nRBC 0.0 0.0 - 0.2 %   Neutrophils Relative % 63 %   Neutro Abs 4.1 1.7 - 7.7 K/uL   Lymphocytes Relative 17 %   Lymphs Abs 1.1 0.7 - 4.0 K/uL   Monocytes Relative 14 %   Monocytes Absolute 0.9 0.1 - 1.0 K/uL   Eosinophils Relative 5 %   Eosinophils Absolute 0.4 0.0 - 0.5 K/uL  Basophils Relative 1 %   Basophils Absolute 0.0 0.0 - 0.1 K/uL   Immature Granulocytes 0 %   Abs Immature Granulocytes 0.01 0.00 - 0.07 K/uL    Comment: Performed at [facility], [address] [place], [place] [address]  Troponin I (High Sensitivity)     Status: None   Collection Time: 11/27/22  9:01 PM  Result Value Ref Range   Troponin I (High Sensitivity) 2 <18 ng/L    Comment: (NOTE) Elevated high sensitivity troponin I (hsTnI) values and significant  changes across serial measurements may suggest ACS but many other  chronic and acute conditions are known to elevate hsTnI results.  Refer to the "Links" section for chest pain algorithms and additional  guidance. Performed at [facility], [address], [place], [place] [address]   Blood gas, venous (at [facility] and [facility])     Status: Abnormal   Collection Time: 11/27/22  9:01 PM  Result Value Ref Range   pH, Ven 7.47 (H) 7.25 - 7.43   pCO2, Ven 44 44 - 60 mmHg   pO2, Ven 43 32 - 45 mmHg   Bicarbonate 32.0 (H) 20.0 - 28.0 mmol/L   Acid-Base Excess 7.1 (H) 0.0 - 2.0 mmol/L   O2 Saturation 79 %   Patient temperature 36.6     Comment: Performed at [facility], [address] [place], [place] [address]  Brain natriuretic peptide     Status: Abnormal   Collection Time: 11/27/22  9:01 PM  Result Value Ref Range   B Natriuretic Peptide 117.0 (H) 0.0 - 100.0 pg/mL    Comment: Performed at [facility], [address]  [place], [place] [address]  Urinalysis, Routine w reflex microscopic -Urine, Clean Catch     Status: None   Collection Time: 11/27/22  9:45 PM  Result Value Ref Range   Color, Urine YELLOW YELLOW   APPearance CLEAR CLEAR   Specific Gravity, Urine 1.006 1.005 - 1.030   pH 6.0 5.0 - 8.0   Glucose, UA NEGATIVE NEGATIVE mg/dL   Hgb urine dipstick NEGATIVE NEGATIVE   Bilirubin Urine NEGATIVE NEGATIVE   Ketones, ur NEGATIVE NEGATIVE mg/dL   Protein, ur NEGATIVE NEGATIVE mg/dL   Nitrite NEGATIVE NEGATIVE   Leukocytes,Ua NEGATIVE NEGATIVE    Comment: Performed at [facility], [address] [place], [place] [address]  SARS Coronavirus 2 by RT PCR (hospital order, performed in [facility] hospital lab) *cepheid single result test* Anterior Nasal Swab     Status: None   Collection Time: 11/27/22 10:42 PM   Specimen: Anterior Nasal Swab  Result Value Ref Range   SARS Coronavirus 2 by RT PCR NEGATIVE NEGATIVE    Comment: (NOTE) SARS-CoV-2 target nucleic acids are NOT DETECTED.  The SARS-CoV-2 RNA is generally detectable in upper and lower respiratory specimens during the acute phase of infection. The lowest concentration of SARS-CoV-2 viral copies this assay can detect is 250 copies / mL. A negative result does not preclude SARS-CoV-2 infection and should not be used as the sole basis for treatment or other patient management decisions.  A negative result may occur with improper specimen collection / handling, submission of specimen other than nasopharyngeal swab, presence of viral mutation(s) within the areas targeted by this assay, and inadequate number of viral copies (<250 copies / mL). A negative result must be combined with clinical observations, patient history, and epidemiological information.  Fact Sheet for Patients:   RoadLapTop.co.za  Fact Sheet for Healthcare Providers: http://kim-miller.com/  This test is not yet approved or  cleared  by the [place] and has been authorized for detection and/or diagnosis of SARS-CoV-2 by FDA under an Emergency Use Authorization (EUA).  This EUA will remain in effect (meaning this test can be used) for the duration of the COVID-19 declaration under Section 564(b)(1) of the Act, 21 U.S.C. section 360bbb-3(b)(1), unless the authorization is terminated or revoked sooner.  Performed at [facility], [name], [place], [place] [address]   Troponin I (High Sensitivity)     Status: None   Collection Time: 11/27/22 11:08 PM  Result Value Ref Range   Troponin I (High Sensitivity) 2 <18 ng/L    Comment: (NOTE) Elevated high sensitivity troponin I (hsTnI) values and significant  changes across serial measurements may suggest ACS but many other  chronic and acute conditions are known to elevate hsTnI results.  Refer to the "Links" section for chest pain algorithms and additional  guidance. Performed at [facility], [address] [place], [place] [address]    CT Chest W Contrast  Result Date: 11/27/2022 CLINICAL DATA:  Pneumonia. EXAM: CT CHEST WITH CONTRAST TECHNIQUE: Multidetector CT imaging of the chest was performed during intravenous contrast administration. RADIATION DOSE REDUCTION: This exam was performed according to the departmental dose-optimization program which includes automated exposure control, adjustment of the mA and/or kV according to patient size and/or use of iterative reconstruction technique. CONTRAST:  60mL OMNIPAQUE IOHEXOL 300 MG/ML  SOLN COMPARISON:  CT angiogram chest 04/23/2022 FINDINGS: Cardiovascular: Aorta is ectatic as seen on the prior study. No evidence for aneurysm. Heart is normal in size. There is no pericardial effusion. There are atherosclerotic calcifications of the aorta and coronary arteries. Mediastinum/Nodes: There is a large hiatal hernia with intrathoracic stomach. The stomach appears nondilated. Esophagus is nondilated. Visualized thyroid  gland is within normal limits. No enlarged lymph nodes are seen. Lungs/Pleura: There is a fat containing Bochdalek hernia in the left lung base, unchanged. Peripheral reticular opacities are again seen bilaterally in the mid and lower lungs similar to prior. Minimal fibrotic changes in the left lung base are also similar to prior. There is no lung consolidation, pleural effusion or pneumothorax. Upper Abdomen: There is a punctate nonobstructing left renal calculus. Musculoskeletal: There are healed left-sided rib fractures. No acute fractures are identified. The bones are diffusely osteopenic. Compression deformities of T3, T6, T7, T8, T9, T11, L1 and L2 appears similar to the prior study. There is accentuated kyphosis, also unchanged. IMPRESSION: 1. No acute cardiopulmonary process. 2. Stable large hiatal hernia with intrathoracic stomach. 3. Stable peripheral reticular opacities in the mid and lower lungs bilaterally. Findings are nonspecific but can be seen in the setting of interstitial lung disease. 4. Stable fat containing Bochdalek hernia in the left lung base. 5. Stable compression deformities of the thoracic and lumbar spine. 6. Aortic atherosclerosis. Aortic Atherosclerosis (ICD10-I70.0). Electronically Signed   By: Darliss Cheney M.D.   On: 11/27/2022 22:35   DG Chest 2 View  Result Date: 11/27/2022 CLINICAL DATA:  Dyspnea EXAM: CHEST - 2 VIEW COMPARISON:  08/27/2022 FINDINGS: Stable cardiomediastinal silhouette. Aortic atherosclerotic calcification. Chronic bronchitic change and interstitial coarsening. This is slightly increased in the right mid and lower lung compared to 08/27/2022. No definite pleural effusion or pneumothorax. Bilateral remote rib fractures. Remote right clavicle fracture. Thoracic kyphosis with multiple midthoracic vertebral body compression fractures grossly similar to 04/23/2022. IMPRESSION: Increased interstitial coarsening in the right mid and lower lung compared to  08/27/2022  suspicious for infection superimposed on a background of interstitial lung disease. Electronically Signed   By: Minerva Fester M.D.   On: 11/27/2022 20:52    Pending Labs [employer] (From admission, onward)     Start     Ordered   Signed and Held  Expectorated Sputum Assessment w Gram Stain, Rflx to [employer]  (COPD / Pneumonia / Cellulitis / Lower Extremity Wound)  Once,   R        Signed and Held   Signed and Held  Legionella Pneumophila Serogp 1 Ur Ag  (COPD / Pneumonia / Cellulitis / Lower Extremity Wound)  Once,   R        Signed and Held   Signed and Held  Strep pneumoniae urinary antigen  (COPD / Pneumonia / Cellulitis / Lower Extremity Wound)  Once,   R        Signed and Held   Signed and Held  Comprehensive metabolic panel  Tomorrow morning,   R        Signed and Held   Signed and Held  Magnesium  Tomorrow morning,   R        Signed and Held   Signed and Held  CBC with Differential/Platelet  Tomorrow morning,   R        Signed and Held            Vitals/Pain Today's Vitals   11/28/22 0015 11/28/22 0136 11/28/22 0137 11/28/22 0137  BP:      Pulse: 79 75    Resp: 10 15    Temp:      TempSrc:      SpO2: 96% 96%    Weight:      PainSc:   (S) Asleep (S) Asleep    Isolation Precautions No active isolations  Medications Medications  azithromycin (ZITHROMAX) 500 mg in sodium chloride 0.9 % 250 mL IVPB (0 mg Intravenous Stopped 11/27/22 2247)  cefTRIAXone (ROCEPHIN) 2 g in sodium chloride 0.9 % 100 mL IVPB (0 g Intravenous Stopped 11/27/22 2146)  iohexol (OMNIPAQUE) 300 MG/ML solution 60 mL (60 mLs Intravenous Contrast Given 11/27/22 2216)  lactated ringers bolus 1,000 mL (0 mLs Intravenous Stopped 11/28/22 0016)  ALPRAZolam (XANAX) tablet 0.5 mg (0.5 mg Oral Given 11/28/22 0016)    Mobility walks with device     Focused Assessments    R Recommendations: See Admitting Provider Note  Report given to:   Additional Notes:

## 2022-11-28 NOTE — Progress Notes (Signed)
   11/28/22 1137  TOC Brief Assessment  Insurance and Status Reviewed  Patient has primary care physician Yes  Home environment has been reviewed From home  Prior level of function: Independent, family support  Prior/Current Home Services No current home services  Social Determinants of Health Reivew SDOH reviewed no interventions necessary  Readmission risk has been reviewed Yes  Transition of care needs no transition of care needs at this time    Transition of Care Department [facility]) has reviewed patient and no TOC needs have been identified at this time. We will continue to monitor patient advancement through interdisciplinary progression rounds. If new patient transition needs arise, please place a TOC consult.

## 2022-11-28 NOTE — Progress Notes (Signed)
Patient to floor from ED. Upon assessment patient has foam dressing to bilateral great toes and cotton/silicone pads between toes. Patient states she goes to wound center in [place] and her daughter changes her dressings daily. She has stage 1 to bilateral great toes and redness noted with calluses between her toes. No open wounds noted to feet or toes. Patient requested not to remove her dressings to place them back the way they were. Protective foam dressing to heels and sacrum for prophylaxis.

## 2022-11-28 NOTE — Assessment & Plan Note (Signed)
-   Continue Breo - Continue as needed albuterol

## 2022-11-28 NOTE — Assessment & Plan Note (Signed)
-   Potassium 5.6 - 1 L fluid given in the ED - Recheck

## 2022-11-28 NOTE — Assessment & Plan Note (Addendum)
-   CT chest shows no acute cardiopulmonary process - Patient does have interstitial lung disease - She is hypoxic - She has been tachypneic - Chest x-ray showed possible infection so she was treated empirically with Zithromax Rocephin - Check procalcitonin before de-escalating antibiotics - CAP workup pending with strep and Legionella urine antigens, sputum culture - Continue to monitor

## 2022-11-28 NOTE — H&P (Signed)
History and Physical    Patient: [name] WUJ:[id number] DOB: 09-26-1934 DOA: 11/27/2022 DOS: the patient was seen and examined on 11/28/2022 PCP: Lynnea Ferrier, MD  Patient coming from: Home  Chief Complaint:  Chief Complaint  Patient presents with   Weakness   HPI: [name] is a 88 y.o. female with medical history significant of anxiety, CHF, COPD, GERD, hyperlipidemia, pulmonary fibrosis, and more presents the ED with a chief complaint of dyspnea.  Patient reports she has had dyspnea for 2 days.  It is worse with exertion.  She has baseline cough that is productive of clear sputum.  She denies any fevers.  Patient denies chest pain.  She reports she does not wear oxygen at home.  She does feel slightly better with the oxygen on now.  Patient reports she uses her nebulizer at home 4 times a day and that helps.  Patient is very motivated to make it home in time for Thanksgiving.  Patient has a cyst on the back of her left neck.  She reports she had it removed 12 to 13 years ago but it came back.  It does not cause her any pain.  She is there.  Patient does not smoke and does not drink.  She does use dip but declines nicotine patch at this time.  She is DNR. Review of Systems: As mentioned in the history of present illness. All other systems reviewed and are negative. Past Medical History:  Diagnosis Date   Anxiety    Chronic diastolic (congestive) heart failure (HCC)    Chronic obstructive pulmonary disease, unspecified (HCC)    COPD (chronic obstructive pulmonary disease) (HCC)    GERD (gastroesophageal reflux disease)    Hyperlipidemia    IBS (irritable bowel syndrome)    Lumbago with sciatica    Osteoporosis    Overactive bladder    Pneumonia    Pulmonary fibrosis (HCC)    Past Surgical History:  Procedure Laterality Date   COLONOSCOPY  10/04/2009   sigmoid diverticula, multiple tubulovillous adneomas, needs surveillance Oct 2013   ESOPHAGOGASTRODUODENOSCOPY   04/16/2011   Dr. Jena Gauss: Noncritical Schatzkis ring( not manipulated because no dysphagia). Normal esophagus otherwise  large hiatal hernia. Gastric polyp-status post biopsy. Gastric erosions-staus post biopsy. Abnormal bulb-status post biopsy. benign small bowel, stomach biopsy with ulcerated gastric antral mucosa with foveolar hyperplasia and surface erosion, polyp with inflamed gastric antral mucosa    ESOPHAGOGASTRODUODENOSCOPY N/A 04/29/2014   Dr. Jena Gauss: Noncritical Schatzki's ring large hiatal hernia. Retained gastric contents.GES with slight delay   femur fracture Left 2023   HIP ARTHROPLASTY Left 09/24/2021   Procedure: ARTHROPLASTY BIPOLAR HIP (HEMIARTHROPLASTY);  Surgeon: Oliver Barre, MD;  Location: [facility];  Service: Orthopedics;  Laterality: Left;   HIP PINNING,CANNULATED Right 04/06/2018   Procedure: CANNULATED HIP PINNING;  Surgeon: Vickki Hearing, MD;  Location: [facility];  Service: Orthopedics;  Laterality: Right;   Social History:  reports that she has quit smoking. Her smoking use included cigarettes. She has been exposed to tobacco smoke. Her smokeless tobacco use includes snuff. She reports that she does not drink alcohol and does not use drugs.  Allergies  Allergen Reactions   Codeine Anaphylaxis, Shortness Of Breath and Rash   Neurontin [Gabapentin] Anaphylaxis   Sulfonamide Derivatives Hives and Shortness Of Breath   Aloe Other (See Comments)    Unknown reaction   Levaquin [Levofloxacin] Other (See Comments)    Pt admitted to hospital with lung problems due  to this medication.   Morphine     Patient's daughter reports hypoxia requiring O2 with morphine and similar opiates.  But does okay with tramadol.   Septra [Sulfamethoxazole-Trimethoprim] Hives   Sulfa Antibiotics Hives   Trimethoprim Hives   Vibra-Tab [Doxycycline] Hives    Family History  Problem Relation Age of Onset   Stroke Mother    Diabetes Mother    Heart disease Father    Colon cancer Neg Hx      Prior to Admission medications   Medication Sig Start Date End Date Taking? Authorizing Provider  acetaminophen (TYLENOL) 500 MG tablet Take 1 tablet (500 mg total) by mouth every 8 (eight) hours as needed. Patient taking differently: Take 500 mg by mouth every 8 (eight) hours as needed for mild pain or moderate pain. 08/26/22   Benjiman Core, MD  albuterol (PROAIR HFA) 108 (90 Base) MCG/ACT inhaler Inhale 2 puffs into the lungs every 4 (four) hours as needed for wheezing or shortness of breath. Patient taking differently: Inhale 2 puffs into the lungs every 6 (six) hours as needed for wheezing or shortness of breath. 06/12/21   Johnson, Clanford L, MD  ALPRAZolam Prudy Feeler) 0.5 MG tablet Take 1 tablet (0.5 mg total) by mouth 2 (two) times daily. 07/29/22   Catarina Hartshorn, MD  CRANBERRY PO Take by mouth.    [provider]  diclofenac Sodium (VOLTAREN) 1 % GEL APPLY AS DIRECTED TO THE AFFECTED KNEE FOUR TIMES DAILY. Patient taking differently: Apply 1 Application topically 4 (four) times daily as needed (pain). 08/04/20   Vickki Hearing, MD  donepezil (ARICEPT) 10 MG tablet Take 10 mg by mouth daily. 07/25/19   [provider]  fluticasone furoate-vilanterol (BREO ELLIPTA) 100-25 MCG/ACT AEPB Inhale 1 puff into the lungs every morning.    [provider]  furosemide (LASIX) 40 MG tablet Take 40 mg by mouth daily.    [provider]  ipratropium-albuterol (DUONEB) 0.5-2.5 (3) MG/3ML SOLN Take 3 mLs by nebulization in the morning, at noon, in the evening, and at bedtime.    [provider]  lidocaine (LIDODERM) 5 % Place 1 patch onto the skin daily as needed. Remove & Discard patch within 12 hours or as directed by MD Patient not taking: Reported on 09/23/2022 07/29/22   Catarina Hartshorn, MD  LINZESS 145 MCG CAPS capsule Take 145 mcg by mouth daily as needed (bowel movement). 05/22/21   [provider]  loratadine (CLARITIN) 10 MG tablet Take 10 mg by  mouth daily. 08/29/21   [provider]  memantine (NAMENDA) 10 MG tablet Take 10 mg by mouth 2 (two) times daily.    [provider]  Multiple Vitamins-Minerals (ONE A DAY WOMEN 50 PLUS PO) Take 1 tablet by mouth daily.    [provider]  pantoprazole (PROTONIX) 40 MG tablet TAKE (1) TABLET BY MOUTH TWICE A DAY WITH MEALS (BREAKFAST AND SUPPER) Patient taking differently: Take 40 mg by mouth 2 (two) times daily before a meal. 05/24/19   Anice Paganini, NP  potassium chloride (KLOR-CON) 10 MEQ tablet Take 10 mEq by mouth daily.    [provider]  simvastatin (ZOCOR) 10 MG tablet Take 10 mg by mouth daily. 02/27/21   [provider]  traMADol (ULTRAM) 50 MG tablet Take 1 tablet (50 mg total) by mouth every 6 (six) hours as needed for moderate pain or severe pain. Patient taking differently: Take 50 mg by mouth every 4 (four) hours. 07/29/22  Catarina Hartshorn, MD  zolpidem (AMBIEN) 5 MG tablet Take 1 tablet (5 mg total) by mouth at bedtime. 07/29/22   Catarina Hartshorn, MD    Physical Exam: Vitals:   11/28/22 0136 11/28/22 0142 11/28/22 0200 11/28/22 0256  BP:  135/82 118/69 (!) 154/80  Pulse: 75 76 79 82  Resp: 15 13 17 17   Temp:  98 F (36.7 C)  97.8 F (36.6 C)  TempSrc:    Oral  SpO2: 96% 97% 97% 100%  Weight:    51.1 kg  Height:    4\' 8"  (1.422 m)   1.  General: Patient lying supine in bed,  no acute distress   2. Psychiatric: Alert and oriented x 3, mood and behavior normal for situation, pleasant and cooperative with exam   3. Neurologic: Speech and language are normal, face is symmetric, moves all 4 extremities voluntarily, at baseline without acute deficits on limited exam   4. HEENMT:  Hard of hearing palpable head is atraumatic, normocephalic, pupils reactive to light, neck with soft mass along posterior left, trachea is midline, mucous membranes are moist   5. Respiratory : Lungs are clear to auscultation bilaterally without wheezing,  rhonchi, rales, no cyanosis, no increase in work of breathing or accessory muscle use   6. Cardiovascular : Heart rate normal, rhythm is regular, no murmurs, rubs or gallops, no peripheral edema, peripheral pulses palpated   7. Gastrointestinal:  Abdomen is soft, nondistended, nontender to palpation bowel sounds active, no masses or organomegaly palpated   8. Skin:  Skin is warm, dry and intact without rashes, acute lesions, or ulcers on limited exam   9.Musculoskeletal:  No acute deformities or trauma, no asymmetry in tone, no peripheral edema, peripheral pulses palpated, no tenderness to palpation in the extremities  Data Reviewed: In the ED No leukocytosis, hemoglobin stable Potassium is slightly elevated 5.6 Troponin is 2 BNP is 117 Chest x-ray shows possible infection on top of interstitial lung disease Patient was started Rocephin and Zithromax CT chest does not show anything alarming Admission requested for hypoxia likely related to pneumonia  Assessment and Plan: * CAP (community acquired pneumonia) - CT chest shows no acute cardiopulmonary process - Patient does have interstitial lung disease - She is hypoxic - She has been tachypneic - Chest x-ray showed possible infection so she was treated empirically with Zithromax Rocephin - Check procalcitonin before de-escalating antibiotics - CAP workup pending with strep and Legionella urine antigens, sputum culture - Continue to monitor  Hyperkalemia - Potassium 5.6 - 1 L fluid given in the ED - Recheck  AKI (acute kidney injury) (HCC) - Creatinine increased, 0.89>> 1.57 - 1 L bolus given in ED - Continue gentle IV hydration - Recheck creatinine  Acute respiratory failure with hypoxia (HCC) - Requiring 2 L nasal cannula - No oxygen at baseline per her report - Patient does have interstitial lung disease which could be contributing, pneumonia may be contributing but workup is seeming less likely - Continue  albuterol as needed - Continue  Breo - Continue to monitor  Hyperlipidemia, unspecified - Continue statin  Gastro-esophageal reflux disease without esophagitis - Continue PPI  Chronic obstructive pulmonary disease, unspecified (HCC) - Continue Breo - Continue as needed albuterol      Advance Care Planning:   Code Status: Limited: Do not attempt resuscitation (DNR) -DNR-LIMITED -Do Not Intubate/DNI   Consults: None at this time no family at bedside  Family Communication:  no family at bedside  Severity of Illness:  The appropriate patient status for this patient is INPATIENT. Inpatient status is judged to be reasonable and necessary in order to provide the required intensity of service to ensure the patient's safety. The patient's presenting symptoms, physical exam findings, and initial radiographic and laboratory data in the context of their chronic comorbidities is felt to place them at high risk for further clinical deterioration. Furthermore, it is not anticipated that the patient will be medically stable for discharge from the hospital within 2 midnights of admission.   * I certify that at the point of admission it is my clinical judgment that the patient will require inpatient hospital care spanning beyond 2 midnights from the point of admission due to high intensity of service, high risk for further deterioration and high frequency of surveillance required.*  Author: Lilyan Gilford, DO 11/28/2022 6:37 AM  For on call review [email or web address].

## 2022-11-29 DIAGNOSIS — E782 Mixed hyperlipidemia: Secondary | ICD-10-CM | POA: Diagnosis not present

## 2022-11-29 DIAGNOSIS — K219 Gastro-esophageal reflux disease without esophagitis: Secondary | ICD-10-CM | POA: Diagnosis not present

## 2022-11-29 DIAGNOSIS — J849 Interstitial pulmonary disease, unspecified: Secondary | ICD-10-CM

## 2022-11-29 DIAGNOSIS — N179 Acute kidney failure, unspecified: Secondary | ICD-10-CM | POA: Diagnosis not present

## 2022-11-29 DIAGNOSIS — E875 Hyperkalemia: Secondary | ICD-10-CM | POA: Diagnosis not present

## 2022-11-29 LAB — CBC
HCT: 32.9 % — ABNORMAL LOW (ref 36.0–46.0)
Hemoglobin: 10 g/dL — ABNORMAL LOW (ref 12.0–15.0)
MCH: 27.6 pg (ref 26.0–34.0)
MCHC: 30.4 g/dL (ref 30.0–36.0)
MCV: 90.9 fL (ref 80.0–100.0)
Platelets: 217 10*3/uL (ref 150–400)
RBC: 3.62 MIL/uL — ABNORMAL LOW (ref 3.87–5.11)
RDW: 14.5 % (ref 11.5–15.5)
WBC: 5.9 10*3/uL (ref 4.0–10.5)
nRBC: 0 % (ref 0.0–0.2)

## 2022-11-29 LAB — BASIC METABOLIC PANEL
Anion gap: 10 (ref 5–15)
BUN: 19 mg/dL (ref 8–23)
CO2: 25 mmol/L (ref 22–32)
Calcium: 8.8 mg/dL — ABNORMAL LOW (ref 8.9–10.3)
Chloride: 104 mmol/L (ref 98–111)
Creatinine, Ser: 1.04 mg/dL — ABNORMAL HIGH (ref 0.44–1.00)
GFR, Estimated: 52 mL/min — ABNORMAL LOW (ref >60)
Glucose, Bld: 126 mg/dL — ABNORMAL HIGH (ref 70–99)
Potassium: 5.1 mmol/L (ref 3.5–5.1)
Sodium: 139 mmol/L (ref 135–145)

## 2022-11-29 MED ORDER — SACCHAROMYCES BOULARDII 250 MG PO CAPS
250.0000 mg | ORAL_CAPSULE | Freq: Two times a day (BID) | ORAL | Status: DC
  Administered 2022-11-29 – 2022-12-01 (×4): 250 mg via ORAL
  Filled 2022-11-29 (×5): qty 1

## 2022-11-29 MED ORDER — DIPHENOXYLATE-ATROPINE 2.5-0.025 MG PO TABS
1.0000 | ORAL_TABLET | Freq: Two times a day (BID) | ORAL | Status: DC | PRN
  Administered 2022-11-29: 1 via ORAL
  Filled 2022-11-29: qty 1

## 2022-11-29 MED ORDER — IPRATROPIUM-ALBUTEROL 0.5-2.5 (3) MG/3ML IN SOLN
3.0000 mL | Freq: Three times a day (TID) | RESPIRATORY_TRACT | Status: DC
  Administered 2022-11-30: 3 mL via RESPIRATORY_TRACT
  Filled 2022-11-29: qty 3

## 2022-11-29 NOTE — Progress Notes (Signed)
Mobility Specialist Progress Note:    11/29/22 1539  Mobility  Activity Ambulated with assistance in hallway;Ambulated with assistance to bathroom  Level of Assistance Minimal assist, patient does 75% or more  Assistive Device Front wheel walker  Distance Ambulated (ft) 30 ft  Range of Motion/Exercises Active;All extremities  Activity Response Tolerated well  Mobility Referral Yes  $Mobility charge 1 Mobility  Mobility Specialist Start Time (ACUTE ONLY) 1500  Mobility Specialist Stop Time (ACUTE ONLY) 1520  Mobility Specialist Time Calculation (min) (ACUTE ONLY) 20 min   Pt received in chair, agreeable to mobility. Required MinA to stand and CGA to ambulate with RW. Tolerated well, asx throughout. Returned pt to bed, left supine. Alarm on, all needs met.   Lawerance Bach Mobility Specialist Please contact via Special educational needs teacher or  Rehab office at [phone]

## 2022-11-29 NOTE — Progress Notes (Signed)
PROGRESS NOTE  [name], is a 88 y.o. female, DOB - 04-18-1934, [id number]  Admit date - 11/27/2022   Admitting Physician Courage Mariea Clonts, MD  Outpatient Primary MD for the patient is Lynnea Ferrier, MD  LOS - 1  Chief Complaint  Patient presents with   Weakness      Brief Narrative:  88 y.o. female with medical history significant of anxiety, CHF, COPD, GERD, hyperlipidemia, pulmonary fibrosis/ILD admitted on 11/28/2022 with acute hypoxic respiratory failure    -Assessment and Plan: 1)Pulmonary fibrosis/ILD flareup--- cough, congestion, tachypnea and dyspnea persist  IV Solu-Medrol bronchodilators with mucolytics as ordered CT chest with contrast without definite pneumonia. -PCT < 0.10, WBC 6.5 ; patient afebrile. -Continue treatment with steroids for interstitial lung disease/fibrosis flareup -No further antibiotics planned.  2) acute hypoxic respiratory failure -Due to #1 above -Management as above #1 -Significantly improved and no requiring oxygen supplementation at rest -Will check desaturation screening.  3)AKI----acute kidney injury with hyperkalemia -creatinine on admission=1.57  ,  baseline creatinine= 0.89 (08/29/22)   ,  -creatinine has continued to improve and essentially back to patient's baseline -Advised to maintain adequate hydration -Potassium within normal limits -Telemetry will be discontinued -Continue to follow electrolytes and renal function trend  4)Hyperlipidemia, unspecified - Continue simvastatin -Heart healthy diet discussed with patient.  5)Gastro-esophageal reflux disease without esophagitis - Continue PPI.  6) chronic anemia--Hgb is not far from baseline -No overt bleeding appreciated -Continue to follow hemoglobin trend.  7) diarrhea -As needed Lomotil and Florastor will be initiated -Patient advised to maintain adequate hydration.  -Total care time is 50 minutes  Status is: Inpatient   Disposition: The patient is  from: Home              Anticipated d/c is to: Home              Anticipated d/c date is: 24 hours.              Patient currently is not medically stable to d/c. Barriers: Not Clinically Stable-   Code Status :  -  Code Status: Limited: Do not attempt resuscitation (DNR) -DNR-LIMITED -Do Not Intubate/DNI    Family Communication:    (patient is alert, awake and coherent)  DVT Prophylaxis  :   - SCDs  heparin injection 5,000 Units Start: 11/28/22 0600 SCDs Start: 11/28/22 0307   Lab Results  Component Value Date   PLT 217 11/29/2022    Inpatient Medications  Scheduled Meds:  dextromethorphan-guaiFENesin  1 tablet Oral BID   donepezil  10 mg Oral Daily   fluticasone furoate-vilanterol  1 puff Inhalation Daily   heparin  5,000 Units Subcutaneous Q8H   ipratropium-albuterol  3 mL Nebulization Q6H   memantine  10 mg Oral BID   methylPREDNISolone (SOLU-MEDROL) injection  40 mg Intravenous Q12H   pantoprazole  40 mg Oral BID AC   saccharomyces boulardii  250 mg Oral BID   simvastatin  10 mg Oral Daily   zolpidem  5 mg Oral QHS   Continuous Infusions:   PRN Meds:.acetaminophen **OR** acetaminophen, albuterol, ALPRAZolam, diphenoxylate-atropine, fentaNYL (SUBLIMAZE) injection, ondansetron **OR** ondansetron (ZOFRAN) IV, oxyCODONE   Anti-infectives (From admission, onward)    Start     Dose/Rate Route Frequency Ordered Stop   11/28/22 2200  azithromycin (ZITHROMAX) 500 mg in sodium chloride 0.9 % 250 mL IVPB        500 mg 250 mL/hr over 60 Minutes Intravenous Every 24 hours 11/28/22 1245 11/28/22 2256  11/28/22 2100  cefTRIAXone (ROCEPHIN) 2 g in sodium chloride 0.9 % 100 mL IVPB        2 g 200 mL/hr over 30 Minutes Intravenous Every 24 hours 11/28/22 0306 11/28/22 2123   11/27/22 2115  cefTRIAXone (ROCEPHIN) 2 g in sodium chloride 0.9 % 100 mL IVPB        2 g 200 mL/hr over 30 Minutes Intravenous  Once 11/27/22 2105 11/27/22 2146   11/27/22 2115  azithromycin (ZITHROMAX)  500 mg in sodium chloride 0.9 % 250 mL IVPB  Status:  Discontinued        500 mg 250 mL/hr over 60 Minutes Intravenous Every 24 hours 11/27/22 2105 11/28/22 1245         Subjective: [name] no chest pain, no nausea, no vomiting.  Reporting some dry coughing spells intermittently and diarrhea.  No using accessory muscles, no requiring oxygen supplementation at rest and is afebrile.  Objective: Vitals:   11/29/22 0449 11/29/22 0743 11/29/22 1351 11/29/22 1511  BP: 136/82   130/87  Pulse: 81   89  Resp: 15   20  Temp: 97.6 F (36.4 C)   97.9 F (36.6 C)  TempSrc:    Oral  SpO2: 92% 93% 94% 97%  Weight:      Height:        Intake/Output Summary (Last 24 hours) at 11/29/2022 1528 Last data filed at 11/29/2022 0455 Gross per 24 hour  Intake 971.67 ml  Output 1050 ml  Net -78.33 ml   Filed Weights   11/27/22 1940 11/28/22 0256  Weight: 47.6 kg 51.1 kg    Physical Exam General exam: Alert, awake, following commands and overall feeling better.  Reports improvements in her shortness of breath and is not requiring oxygen supplementation.  No chest pain, no nausea, no vomiting.  Experiencing diarrhea. Respiratory system: Fair air movement bilaterally; positive rhonchi and mild expiratory wheezing.  No using accessory muscles. Cardiovascular system:RRR.  No rubs, no gallops, no JVD on exam. Gastrointestinal system: Abdomen is nondistended, soft and nontender. No organomegaly or masses felt. Normal bowel sounds heard. Central nervous system: Moving 4 limbs spontaneously.  No focal neurological deficits. Extremities: No cyanosis or clubbing. Skin: No petechiae. Psychiatry: Judgement and insight appear normal. Mood & affect appropriate.   Data Reviewed: I have personally reviewed following labs and imaging studies  CBC: Recent Labs  Lab 11/27/22 2101 11/28/22 0453 11/29/22 0430  WBC 6.5 6.1 5.9  NEUTROABS 4.1 3.7  --   HGB 10.0* 9.3* 10.0*  HCT 32.3* 30.3* 32.9*  MCV  91.5 89.6 90.9  PLT 213 186 217   Basic Metabolic Panel: Recent Labs  Lab 11/27/22 2101 11/28/22 0453 11/29/22 0430  NA 137 136 139  K 5.6* 5.0 5.1  CL 101 100 104  CO2 30 28 25   GLUCOSE 82 100* 126*  BUN 29* 24* 19  CREATININE 1.57* 1.22* 1.04*  CALCIUM 8.7* 8.6* 8.8*  MG  --  2.6*  --    GFR: Estimated Creatinine Clearance: 25.9 mL/min (A) (by C-G formula based on SCr of 1.04 mg/dL (H)).  Liver Function Tests: Recent Labs  Lab 11/27/22 2101 11/28/22 0453  AST 24 23  ALT 17 15  ALKPHOS 87 72  BILITOT 0.4 0.5  PROT 6.8 6.1*  ALBUMIN 3.5 3.0*   Recent Results (from the past 240 hour(s))  SARS Coronavirus 2 by RT PCR (hospital order, performed in [facility] hospital lab) *cepheid single result test* Anterior Nasal Swab  Status: None   Collection Time: 11/27/22 10:42 PM   Specimen: Anterior Nasal Swab  Result Value Ref Range Status   SARS Coronavirus 2 by RT PCR NEGATIVE NEGATIVE Final    Comment: (NOTE) SARS-CoV-2 target nucleic acids are NOT DETECTED.  The SARS-CoV-2 RNA is generally detectable in upper and lower respiratory specimens during the acute phase of infection. The lowest concentration of SARS-CoV-2 viral copies this assay can detect is 250 copies / mL. A negative result does not preclude SARS-CoV-2 infection and should not be used as the sole basis for treatment or other patient management decisions.  A negative result may occur with improper specimen collection / handling, submission of specimen other than nasopharyngeal swab, presence of viral mutation(s) within the areas targeted by this assay, and inadequate number of viral copies (<250 copies / mL). A negative result must be combined with clinical observations, patient history, and epidemiological information.  Fact Sheet for Patients:   RoadLapTop.co.za  Fact Sheet for Healthcare Providers: http://kim-miller.com/  This test is not yet approved  or  cleared by the [place] FDA and has been authorized for detection and/or diagnosis of SARS-CoV-2 by FDA under an Emergency Use Authorization (EUA).  This EUA will remain in effect (meaning this test can be used) for the duration of the COVID-19 declaration under Section 564(b)(1) of the Act, 21 U.S.C. section 360bbb-3(b)(1), unless the authorization is terminated or revoked sooner.  Performed at [facility], [address], [place], [place] [address]     Radiology Studies: CT Chest W Contrast  Result Date: 11/27/2022 CLINICAL DATA:  Pneumonia. EXAM: CT CHEST WITH CONTRAST TECHNIQUE: Multidetector CT imaging of the chest was performed during intravenous contrast administration. RADIATION DOSE REDUCTION: This exam was performed according to the departmental dose-optimization program which includes automated exposure control, adjustment of the mA and/or kV according to patient size and/or use of iterative reconstruction technique. CONTRAST:  60mL OMNIPAQUE IOHEXOL 300 MG/ML  SOLN COMPARISON:  CT angiogram chest 04/23/2022 FINDINGS: Cardiovascular: Aorta is ectatic as seen on the prior study. No evidence for aneurysm. Heart is normal in size. There is no pericardial effusion. There are atherosclerotic calcifications of the aorta and coronary arteries. Mediastinum/Nodes: There is a large hiatal hernia with intrathoracic stomach. The stomach appears nondilated. Esophagus is nondilated. Visualized thyroid gland is within normal limits. No enlarged lymph nodes are seen. Lungs/Pleura: There is a fat containing Bochdalek hernia in the left lung base, unchanged. Peripheral reticular opacities are again seen bilaterally in the mid and lower lungs similar to prior. Minimal fibrotic changes in the left lung base are also similar to prior. There is no lung consolidation, pleural effusion or pneumothorax. Upper Abdomen: There is a punctate nonobstructing left renal calculus. Musculoskeletal: There are healed  left-sided rib fractures. No acute fractures are identified. The bones are diffusely osteopenic. Compression deformities of T3, T6, T7, T8, T9, T11, L1 and L2 appears similar to the prior study. There is accentuated kyphosis, also unchanged. IMPRESSION: 1. No acute cardiopulmonary process. 2. Stable large hiatal hernia with intrathoracic stomach. 3. Stable peripheral reticular opacities in the mid and lower lungs bilaterally. Findings are nonspecific but can be seen in the setting of interstitial lung disease. 4. Stable fat containing Bochdalek hernia in the left lung base. 5. Stable compression deformities of the thoracic and lumbar spine. 6. Aortic atherosclerosis. Aortic Atherosclerosis (ICD10-I70.0). Electronically Signed   By: Darliss Cheney M.D.   On: 11/27/2022 22:35   DG Chest 2 View  Result Date:  11/27/2022 CLINICAL DATA:  Dyspnea EXAM: CHEST - 2 VIEW COMPARISON:  08/27/2022 FINDINGS: Stable cardiomediastinal silhouette. Aortic atherosclerotic calcification. Chronic bronchitic change and interstitial coarsening. This is slightly increased in the right mid and lower lung compared to 08/27/2022. No definite pleural effusion or pneumothorax. Bilateral remote rib fractures. Remote right clavicle fracture. Thoracic kyphosis with multiple midthoracic vertebral body compression fractures grossly similar to 04/23/2022. IMPRESSION: Increased interstitial coarsening in the right mid and lower lung compared to 08/27/2022 suspicious for infection superimposed on a background of interstitial lung disease. Electronically Signed   By: Minerva Fester M.D.   On: 11/27/2022 20:52    Scheduled Meds:  dextromethorphan-guaiFENesin  1 tablet Oral BID   donepezil  10 mg Oral Daily   fluticasone furoate-vilanterol  1 puff Inhalation Daily   heparin  5,000 Units Subcutaneous Q8H   ipratropium-albuterol  3 mL Nebulization Q6H   memantine  10 mg Oral BID   methylPREDNISolone (SOLU-MEDROL) injection  40 mg Intravenous  Q12H   pantoprazole  40 mg Oral BID AC   saccharomyces boulardii  250 mg Oral BID   simvastatin  10 mg Oral Daily   zolpidem  5 mg Oral QHS   Continuous Infusions:     LOS: 1 day   Vassie Loll M.D on 11/29/2022 at 3:28 PM  Go to [email or web address] - for contact info  [facility] - Office  [phone]  If 7PM-7AM, please contact night-coverage [email or web address] 11/29/2022, 3:28 PM

## 2022-11-29 NOTE — Progress Notes (Signed)
Pt remains on Room Air tonight- numerous requests for small tasks nearly hourly, poor sleep despite meds- anxiety evident. Pt alert and oriented , "lonely". Expects to return to Home with Daughter. Headache this AM at 0600- recd tylenol with good results. VSS

## 2022-11-29 NOTE — TOC Initial Note (Signed)
Transition of Care [facility]) - Initial/Assessment Note    Patient Details  Name: [name] MRN: [id number] Date of Birth: May 11, 1934  [facility] of [facility] CM/SW Contact:    Villa Herb, LCSWA Phone Number: 11/29/2022, 1:32 PM  Clinical Narrative:                 Pt is high risk for readmission. CSW spoke with pt and family at bedside. Pt lives with her family. Pts family assist with completing ADLs. Pt also has a CAP aide that assist in the home. Pts family provides transportation as needed. Pt has HH PT with [facility], will need new orders. Pt has a walker, wheelchair, BSC, lift chair and hospital bed in the home. TOC to follow.   Expected Discharge Plan: Home w Home Health Services Barriers to Discharge: Continued Medical Work up   Patient Goals and CMS Choice Patient states their goals for this hospitalization and ongoing recovery are:: return home CMS Medicare.gov Compare Post Acute Care list provided to:: Patient Choice offered to / list presented to : Patient [facility] ownership interest in [facility].provided to:: Patient    Expected Discharge Plan and Services In-house Referral: Clinical Social Work Discharge Planning Services: CM Consult Post Acute Care Choice: Home Health Living arrangements for the past 2 months: Single Family Home                                      Prior Living Arrangements/Services Living arrangements for the past 2 months: Single Family Home Lives with:: Adult Children Patient language and need for interpreter reviewed:: Yes Do you feel safe going back to the place where you live?: Yes      Need for Family Participation in Patient Care: Yes (Comment) Care giver support system in place?: Yes (comment) Current home services: DME Criminal Activity/Legal Involvement Pertinent to Current Situation/Hospitalization: No - Comment as needed  Activities of Daily Living   ADL Screening (condition at time of  admission) Independently performs ADLs?: No Does the patient have a NEW difficulty with bathing/dressing/toileting/self-feeding that is expected to last >3 days?: No Does the patient have a NEW difficulty with getting in/out of bed, walking, or climbing stairs that is expected to last >3 days?: No Does the patient have a NEW difficulty with communication that is expected to last >3 days?: No Is the patient deaf or have difficulty hearing?: Yes Does the patient have difficulty seeing, even when wearing glasses/contacts?: No Does the patient have difficulty concentrating, remembering, or making decisions?: No  Permission Sought/Granted                  Emotional Assessment Appearance:: Appears stated age Attitude/Demeanor/Rapport: Engaged Affect (typically observed): Accepting Orientation: : Oriented to Self, Oriented to Place, Oriented to Situation, Oriented to  Time Alcohol / Substance Use: Not Applicable Psych Involvement: No (comment)  Admission diagnosis:  CAP (community acquired pneumonia) [J18.9] AKI (acute kidney injury) (HCC) [N17.9] Pneumonia due to infectious organism, unspecified laterality, unspecified part of lung [J18.9] Acute respiratory failure with hypoxia (HCC) [J96.01] Patient Active Problem List   Diagnosis Date Noted   AKI (acute kidney injury) (HCC) 11/28/2022   Hyperkalemia 11/28/2022   Hypotension 08/28/2022   Dehydration 08/28/2022   Hypokalemia 08/28/2022   Hip pain 08/28/2022   Lactic acidosis 08/28/2022   Closed fracture of right distal radius 07/28/2022   Pressure injury of skin  07/26/2022   Closed L1 vertebral fracture (HCC) 07/26/2022   Fall 07/25/2022   Compression fracture of L1,L2 vertebra (HCC) 07/25/2022   Influenza A 04/28/2022   Hypoxia 04/23/2022   Anemia, unspecified 11/23/2021   Long term (current) use of inhaled steroids 11/23/2021   Unspecified dementia, unspecified severity, with mood disturbance (HCC) 11/23/2021   Fracture of  femoral neck, left, closed (HCC) 09/24/2021   Acute respiratory failure with hypoxia (HCC) 06/10/2021   Tobacco use disorder 06/10/2021   Generalized weakness 06/10/2021   Bilateral conjunctivitis 06/10/2021   Chronic diastolic CHF (congestive heart failure) (HCC) 05/14/2021   Depression 05/14/2021   Hyperlipidemia    COPD exacerbation (HCC) 05/13/2021   Long term (current) use of opiate analgesic 01/04/2021   Chronic pain 04/23/2019   General unsteadiness 04/23/2019   Polyarthropathy 04/23/2019   Pseudobulbar affect 04/23/2019   Tremor 04/23/2019   Chronic obstructive pulmonary disease, unspecified (HCC)    Gastro-esophageal reflux disease without esophagitis    Hyperlipidemia, unspecified    Pulmonary fibrosis, unspecified (HCC)    Acute on chronic diastolic (congestive) heart failure (HCC)    Age-related osteoporosis without current pathological fracture    Anxiety disorder, unspecified    Chest pain, unspecified    Chronic diastolic (congestive) heart failure (HCC)    Collapsed vertebra, not elsewhere classified, site unspecified, subsequent encounter for fracture with routine healing    Edema, unspecified    Lumbago with sciatica    Overactive bladder    Pain in unspecified hand    Pressure ulcer of unspecified site, unspecified stage    Unspecified protein-calorie malnutrition (HCC)    Acute diastolic heart failure (HCC) 09/30/2018   Dyspnea 09/29/2018   Pneumonia 06/30/2018   Acute CHF (congestive heart failure) (HCC) 06/29/2018   Vascular dementia without behavioral disturbance (HCC) 06/26/2018   CAP (community acquired pneumonia) 06/23/2018   Bilateral pleural effusion 06/23/2018   Leukocytosis 06/20/2018   Chronic anemia 06/20/2018   Dementia (HCC) 06/19/2018   Bilateral lower extremity edema 06/18/2018   Protein-calorie malnutrition, severe (HCC) 06/18/2018   S/P internal fixation right hip fracture cannulated screw placement 04/06/18 04/27/2018   UTI (urinary  tract infection) 04/11/2018   Altered mental status 04/10/2018   Anxiety 04/10/2018   Hyponatremia 04/10/2018   UTI (urinary tract infection) due to urinary indwelling catheter (HCC) 04/10/2018   Chronic constipation 04/09/2018   Urinary retention 04/09/2018   Closed right hip fracture (HCC) 04/05/2018   GERD (gastroesophageal reflux disease) 04/05/2018   COPD with acute exacerbation (HCC) 04/05/2018   Hiatal hernia    Dyspepsia 04/04/2014   Hematochezia 04/30/2011   RUQ pain 03/25/2011   Adenomatous polyps 03/25/2011   ABDOMINAL PAIN, LEFT LOWER QUADRANT 10/16/2009   HYPERLIPIDEMIA 10/09/2009   BRONCHITIS 10/09/2009   Osteoporosis 10/09/2009   Personal history of pneumonia (recurrent) 01/05/1999   Mixed hyperlipidemia 01/05/1999   PCP:  Lynnea Ferrier, MD Pharmacy:   [facility] - [place], [place] - [address] [place] [address] Phone: [phone] Fax: [phone]  [facility] - [address] [place], [place] - [address] [place] [address] Phone: [phone] Fax: [phone]  [facility] [phone] - [place], [place] - [address] AT [facility] [address] [place] [address] Phone: [phone] Fax: [phone]     Social Determinants of Health (SDOH) Social History: SDOH Screenings   Food Insecurity: No Food Insecurity (  11/28/2022)  Housing: Low Risk  (11/28/2022)  Transportation Needs: No Transportation Needs (11/28/2022)  Utilities: Not At Risk (11/28/2022)  Financial Resource Strain: Low Risk  (09/08/2022)   [facility]  Tobacco Use: High Risk (11/28/2022)   SDOH Interventions:     Readmission Risk Interventions    11/29/2022    1:31 PM 07/26/2022   12:28 PM 09/30/2021    9:22 AM  Readmission Risk Prevention Plan  Transportation Screening Complete Complete Complete  PCP or Specialist Appt within 5-7 Days   Not Complete   Home Care Screening  Complete   Medication Review (RN CM)  Complete   Medication Review (RN Care Manager) Complete  Complete  HRI or Home Care Consult Complete  Complete  SW Recovery Care/Counseling Consult Complete  Complete  Palliative Care Screening Not Applicable  Not Applicable  Skilled Nursing Facility Not Applicable  Complete

## 2022-11-30 DIAGNOSIS — E875 Hyperkalemia: Secondary | ICD-10-CM | POA: Diagnosis not present

## 2022-11-30 DIAGNOSIS — N179 Acute kidney failure, unspecified: Secondary | ICD-10-CM | POA: Diagnosis not present

## 2022-11-30 DIAGNOSIS — J189 Pneumonia, unspecified organism: Secondary | ICD-10-CM | POA: Diagnosis not present

## 2022-11-30 DIAGNOSIS — E782 Mixed hyperlipidemia: Secondary | ICD-10-CM | POA: Diagnosis not present

## 2022-11-30 MED ORDER — PREDNISONE 20 MG PO TABS: ORAL_TABLET | ORAL | 0 refills | Status: DC

## 2022-11-30 MED ORDER — FUROSEMIDE 40 MG PO TABS: 40.0000 mg | ORAL_TABLET | Freq: Every day | ORAL | Status: DC | PRN

## 2022-11-30 MED ORDER — POTASSIUM CHLORIDE ER 10 MEQ PO TBCR: 10.0000 meq | EXTENDED_RELEASE_TABLET | Freq: Every day | ORAL | Status: DC

## 2022-11-30 MED ORDER — DM-GUAIFENESIN ER 30-600 MG PO TB12: 1.0000 | ORAL_TABLET | Freq: Two times a day (BID) | ORAL | 0 refills | Status: AC

## 2022-11-30 MED ORDER — SACCHAROMYCES BOULARDII 250 MG PO CAPS: 250.0000 mg | ORAL_CAPSULE | Freq: Two times a day (BID) | ORAL | 0 refills | Status: AC

## 2022-11-30 MED ORDER — IPRATROPIUM-ALBUTEROL 0.5-2.5 (3) MG/3ML IN SOLN
3.0000 mL | Freq: Two times a day (BID) | RESPIRATORY_TRACT | Status: DC
  Administered 2022-11-30 – 2022-12-01 (×2): 3 mL via RESPIRATORY_TRACT
  Filled 2022-11-30 (×2): qty 3

## 2022-11-30 NOTE — Discharge Summary (Signed)
Physician Discharge Summary   Patient: [name] MRN: [id number] DOB: [birth year]  Admit date:     11/27/2022  Discharge date: 11/30/22  Discharge Physician: Vassie Loll   PCP: Lynnea Ferrier, MD   Recommendations at discharge:  Repeat BMET to follow electrolytes and renal function Repeat CBC to follow Hgb trend/stability.  Discharge Diagnoses: Principal Problem:   CAP (community acquired pneumonia) Active Problems:   Chronic obstructive pulmonary disease, unspecified (HCC)   Gastro-esophageal reflux disease without esophagitis   Hyperlipidemia, unspecified   Acute respiratory failure with hypoxia (HCC)   AKI (acute kidney injury) [facility])   Hyperkalemia    Brief Hospital admission Course: As per H&P written by Dr. Carren Rang on 11/28/22 [name] is a 88 y.o. female with medical history significant of anxiety, CHF, COPD, GERD, hyperlipidemia, pulmonary fibrosis, and more presents the ED with a chief complaint of dyspnea.  Patient reports she has had dyspnea for 2 days.  It is worse with exertion.  She has baseline cough that is productive of clear sputum.  She denies any fevers.  Patient denies chest pain.  She reports she does not wear oxygen at home.  She does feel slightly better with the oxygen on now.  Patient reports she uses her nebulizer at home 4 times a day and that helps.  Patient is very motivated to make it home in time for Thanksgiving.   Patient has a cyst on the back of her left neck.  She reports she had it removed 12 to 13 years ago but it came back.  It does not cause her any pain.  She is there.   Patient does not smoke and does not drink.  She does use dip but declines nicotine patch at this time.  She is DNR.  Assessment and Plan: 1)Pulmonary fibrosis/ILD flareup--- cough, congestion, tachypnea and dyspnea persist  IV Solu-Medrol bronchodilators with mucolytics as ordered CT chest with contrast without definite pneumonia. -PCT < 0.10, WBC 6.5 ;  patient afebrile. -Continue treatment with steroids for interstitial lung disease/fibrosis flareup -No further antibiotics planned.   2) acute hypoxic respiratory failure -Due to #1 above -Management as above #1 -Significantly improved and no requiring oxygen supplementation at rest -no oxygen supplementation needed at discharge.   3)AKI----acute kidney injury with hyperkalemia -creatinine on admission=1.57  ,  baseline creatinine= 0.89 (08/29/22)   ,  -creatinine has continued to improve and essentially back to patient's baseline -Advised to maintain adequate hydration -Potassium within normal limits -repeat BMET at follow up visit to track electrolytes and renal function trend. -lasix changed to PRN use only and her potassium supplementation to be taken only if lasix needed.   4)Hyperlipidemia, unspecified - Continue simvastatin -Heart healthy diet discussed with patient.   5)Gastro-esophageal reflux disease without esophagitis - Continue PPI.   6) chronic anemia--Hgb is not far from baseline -No overt bleeding appreciated -Continue to follow hemoglobin trend.   7) diarrhea -As needed Lomotil and Florastor provided -condition improved. -patient discharge on florastor and instructed to increase fiber intake and maintain adequate hydration. -Patient advised to maintain adequate hydration. -patient chronically on Linzess   8)physical deconditioning  -home health PT/OT arranged at discharge  Consultants: none  Procedures performed: see below for x-ray reports.   Disposition: Home Diet recommendation: heart healthy/low sodium diet.  DISCHARGE MEDICATION: Allergies as of 11/30/2022       Reactions   Codeine Anaphylaxis, Shortness Of Breath, Rash   Neurontin [gabapentin] Anaphylaxis   Sulfonamide  Derivatives Hives, Shortness Of Breath   Aloe Other (See Comments)   Unknown reaction   Levaquin [levofloxacin] Other (See Comments)   Pt admitted to hospital with lung  problems due to this medication.   Ms Contin [morphine] Other (See Comments)   Patient's daughter reports hypoxia requiring O2 with morphine and similar opiates.  But does okay with tramadol.   Septra [sulfamethoxazole-trimethoprim] Hives   Sulfa Antibiotics Hives   Trimethoprim Hives   Vibra-tab [doxycycline] Hives        Medication List     TAKE these medications    acetaminophen 500 MG tablet Commonly known as: TYLENOL Take 1 tablet (500 mg total) by mouth every 8 (eight) hours as needed. What changed: reasons to take this   albuterol 108 (90 Base) MCG/ACT inhaler Commonly known as: ProAir HFA Inhale 2 puffs into the lungs every 4 (four) hours as needed for wheezing or shortness of breath. What changed:  how much to take how to take this when to take this   ALPRAZolam 0.5 MG tablet Commonly known as: XANAX Take 1 tablet (0.5 mg total) by mouth 2 (two) times daily.   Breo Ellipta 100-25 MCG/ACT Aepb Generic drug: fluticasone furoate-vilanterol Inhale 1 puff into the lungs daily.   CRANBERRY PO Take 1 tablet by mouth daily.   dextromethorphan-guaiFENesin 30-600 MG 12hr tablet Commonly known as: MUCINEX DM Take 1 tablet by mouth 2 (two) times daily for 10 days.   diclofenac Sodium 1 % Gel Commonly known as: VOLTAREN APPLY AS DIRECTED TO THE AFFECTED KNEE FOUR TIMES DAILY.   donepezil 10 MG tablet Commonly known as: ARICEPT Take 10 mg by mouth at bedtime.   furosemide 40 MG tablet Commonly known as: LASIX Take 1 tablet (40 mg total) by mouth daily as needed for fluid or edema. What changed:  when to take this reasons to take this   ipratropium-albuterol 0.5-2.5 (3) MG/3ML Soln Commonly known as: DUONEB Take 3 mLs by nebulization in the morning, at noon, in the evening, and at bedtime.   Linzess 145 MCG Caps capsule Generic drug: linaclotide Take 145 mcg by mouth daily as needed (constipation).   loratadine 10 MG tablet Commonly known as:  CLARITIN Take 10 mg by mouth daily.   memantine 10 MG tablet Commonly known as: NAMENDA Take 10 mg by mouth 2 (two) times daily.   ONE A DAY WOMEN 50 PLUS PO Take 1 tablet by mouth daily.   pantoprazole 40 MG tablet Commonly known as: PROTONIX TAKE (1) TABLET BY MOUTH TWICE A DAY WITH MEALS (BREAKFAST AND SUPPER)   potassium chloride 10 MEQ tablet Commonly known as: KLOR-CON Take 1 tablet (10 mEq total) by mouth daily. When taking lasix. What changed: additional instructions   predniSONE 20 MG tablet Commonly known as: DELTASONE Take 2 tablets by mouth daily X2 days; then 1 tablet by mouth daily X 2 days; then 1/2 tablet by mouth daily X 3 days and stop prednisone.   saccharomyces boulardii 250 MG capsule Commonly known as: FLORASTOR Take 1 capsule (250 mg total) by mouth 2 (two) times daily with a meal for 10 days.   simvastatin 10 MG tablet Commonly known as: ZOCOR Take 10 mg by mouth at bedtime.   traMADol 50 MG tablet Commonly known as: ULTRAM Take 1 tablet (50 mg total) by mouth every 6 (six) hours as needed for moderate pain or severe pain. What changed:  how much to take when to take this   zolpidem 5  MG tablet Commonly known as: AMBIEN Take 1 tablet (5 mg total) by mouth at bedtime.        Follow-up Information     Lynnea Ferrier, MD. Schedule an appointment as soon as possible for a visit in 10 day(s).   Specialty: Internal Medicine Contact information: [address] [place] [address] [phone]                Discharge Exam: Ceasar Mons Weights   11/27/22 1940 11/28/22 0256  Weight: 47.6 kg 51.1 kg   General exam: Alert, awake, following commands and overall feeling better.  Reports improvements in her SOB and diarrhea; feels ready to go home. Not requiring oxygen supplementation. Respiratory system: improved air movement; not using accessory muscles, positive rhonchi and exp very mild wheezing  appreciated. Cardiovascular system:RRR.  No rubs, no gallops, no JVD on exam. Gastrointestinal system: Abdomen is nondistended, soft and nontender. No organomegaly or masses felt. Normal bowel sounds heard. Central nervous system: Moving 4 limbs spontaneously.  No focal neurological deficits. Extremities: No cyanosis or clubbing. Skin: No petechiae. Psychiatry: Judgement and insight appear normal. Mood & affect appropriate.   Condition at discharge: stable and improved.  The results of significant diagnostics from this hospitalization (including imaging, microbiology, ancillary and laboratory) are listed below for reference.   Imaging Studies: CT Chest W Contrast  Result Date: 11/27/2022 CLINICAL DATA:  Pneumonia. EXAM: CT CHEST WITH CONTRAST TECHNIQUE: Multidetector CT imaging of the chest was performed during intravenous contrast administration. RADIATION DOSE REDUCTION: This exam was performed according to the departmental dose-optimization program which includes automated exposure control, adjustment of the mA and/or kV according to patient size and/or use of iterative reconstruction technique. CONTRAST:  60mL OMNIPAQUE IOHEXOL 300 MG/ML  SOLN COMPARISON:  CT angiogram chest 04/23/2022 FINDINGS: Cardiovascular: Aorta is ectatic as seen on the prior study. No evidence for aneurysm. Heart is normal in size. There is no pericardial effusion. There are atherosclerotic calcifications of the aorta and coronary arteries. Mediastinum/Nodes: There is a large hiatal hernia with intrathoracic stomach. The stomach appears nondilated. Esophagus is nondilated. Visualized thyroid gland is within normal limits. No enlarged lymph nodes are seen. Lungs/Pleura: There is a fat containing Bochdalek hernia in the left lung base, unchanged. Peripheral reticular opacities are again seen bilaterally in the mid and lower lungs similar to prior. Minimal fibrotic changes in the left lung base are also similar to prior.  There is no lung consolidation, pleural effusion or pneumothorax. Upper Abdomen: There is a punctate nonobstructing left renal calculus. Musculoskeletal: There are healed left-sided rib fractures. No acute fractures are identified. The bones are diffusely osteopenic. Compression deformities of T3, T6, T7, T8, T9, T11, L1 and L2 appears similar to the prior study. There is accentuated kyphosis, also unchanged. IMPRESSION: 1. No acute cardiopulmonary process. 2. Stable large hiatal hernia with intrathoracic stomach. 3. Stable peripheral reticular opacities in the mid and lower lungs bilaterally. Findings are nonspecific but can be seen in the setting of interstitial lung disease. 4. Stable fat containing Bochdalek hernia in the left lung base. 5. Stable compression deformities of the thoracic and lumbar spine. 6. Aortic atherosclerosis. Aortic Atherosclerosis (ICD10-I70.0). Electronically Signed   By: Darliss Cheney M.D.   On: 11/27/2022 22:35   DG Chest 2 View  Result Date: 11/27/2022 CLINICAL DATA:  Dyspnea EXAM: CHEST - 2 VIEW COMPARISON:  08/27/2022 FINDINGS: Stable cardiomediastinal silhouette. Aortic atherosclerotic calcification. Chronic bronchitic change and interstitial coarsening. This is slightly  increased in the right mid and lower lung compared to 08/27/2022. No definite pleural effusion or pneumothorax. Bilateral remote rib fractures. Remote right clavicle fracture. Thoracic kyphosis with multiple midthoracic vertebral body compression fractures grossly similar to 04/23/2022. IMPRESSION: Increased interstitial coarsening in the right mid and lower lung compared to 08/27/2022 suspicious for infection superimposed on a background of interstitial lung disease. Electronically Signed   By: Minerva Fester M.D.   On: 11/27/2022 20:52   DG Knee 1-2 Views Right  Result Date: 11/05/2022 Limited views of the right knee were obtained in clinic today.  No acute injuries are noted.  Cannot fully assess the  joint space narrowing.  There are no significant osteophytes in the medial or lateral compartments.  No obvious osteophytes on the lateral projection of the patella.  Impression: Negative right knee x-rays   DG Shoulder Left  Result Date: 11/05/2022 Limited views of the left shoulder were obtained in clinic today.  No acute injuries noted.  No obvious osteophytes.  There is no obvious proximal humeral migration, although the humeral head is riding high.  Impression: Negative left shoulder x-ray    Microbiology: Results for orders placed or performed during the hospital encounter of 11/27/22  SARS Coronavirus 2 by RT PCR (hospital order, performed in [facility] hospital lab) *cepheid single result test* Anterior Nasal Swab     Status: None   Collection Time: 11/27/22 10:42 PM   Specimen: Anterior Nasal Swab  Result Value Ref Range Status   SARS Coronavirus 2 by RT PCR NEGATIVE NEGATIVE Final    Comment: (NOTE) SARS-CoV-2 target nucleic acids are NOT DETECTED.  The SARS-CoV-2 RNA is generally detectable in upper and lower respiratory specimens during the acute phase of infection. The lowest concentration of SARS-CoV-2 viral copies this assay can detect is 250 copies / mL. A negative result does not preclude SARS-CoV-2 infection and should not be used as the sole basis for treatment or other patient management decisions.  A negative result may occur with improper specimen collection / handling, submission of specimen other than nasopharyngeal swab, presence of viral mutation(s) within the areas targeted by this assay, and inadequate number of viral copies (<250 copies / mL). A negative result must be combined with clinical observations, patient history, and epidemiological information.  Fact Sheet for Patients:   RoadLapTop.co.za  Fact Sheet for Healthcare Providers: http://kim-miller.com/  This test is not yet approved or  cleared by the  [place] FDA and has been authorized for detection and/or diagnosis of SARS-CoV-2 by FDA under an Emergency Use Authorization (EUA).  This EUA will remain in effect (meaning this test can be used) for the duration of the COVID-19 declaration under Section 564(b)(1) of the Act, 21 U.S.C. section 360bbb-3(b)(1), unless the authorization is terminated or revoked sooner.  Performed at [facility], [address], [place], [place] [address]     Labs: CBC: Recent Labs  Lab 11/27/22 2101 11/28/22 0453 11/29/22 0430  WBC 6.5 6.1 5.9  NEUTROABS 4.1 3.7  --   HGB 10.0* 9.3* 10.0*  HCT 32.3* 30.3* 32.9*  MCV 91.5 89.6 90.9  PLT 213 186 217   Basic Metabolic Panel: Recent Labs  Lab 11/27/22 2101 11/28/22 0453 11/29/22 0430  NA 137 136 139  K 5.6* 5.0 5.1  CL 101 100 104  CO2 30 28 25   GLUCOSE 82 100* 126*  BUN 29* 24* 19  CREATININE 1.57* 1.22* 1.04*  CALCIUM 8.7* 8.6* 8.8*  MG  --  2.6*  --  Liver Function Tests: Recent Labs  Lab 11/27/22 2101 11/28/22 0453  AST 24 23  ALT 17 15  ALKPHOS 87 72  BILITOT 0.4 0.5  PROT 6.8 6.1*  ALBUMIN 3.5 3.0*   CBG: No results for input(s): "GLUCAP" in the last 168 hours.  Discharge time spent: greater than 30 minutes.  Signed: Vassie Loll, MD [facility] 11/30/2022

## 2022-11-30 NOTE — Care Management Important Message (Signed)
Important Message  Patient Details  Name: [name] MRN: [id number] Date of Birth: [birth year]   Important Message Given:  N/A - LOS <3 / Initial given by admissions     Corey Harold 11/30/2022, 2:46 PM

## 2022-11-30 NOTE — TOC Initial Note (Signed)
Transition of Care [facility]) - Initial/Assessment Note    Patient Details  Name: [name] MRN: [id number] Date of Birth: 1934-07-07  Transition of Care [facility]) CM/SW Contact:    Villa Herb, LCSWA Phone Number: 11/30/2022, 10:28 AM  Clinical Narrative:                 CSW updated that pt will likely D/C home today. CSW requested that MD place [facility] PT/OT resumption orders. CSW updated Morrie Sheldon with [facility] that [facility] orders will be placed. [facility] will continue to follow pt in the community. TOC signing off.   Expected Discharge Plan: Home w Home Health Services Barriers to Discharge: Barriers Resolved   Patient Goals and CMS Choice Patient states their goals for this hospitalization and ongoing recovery are:: return home CMS Medicare.gov Compare Post Acute Care list provided to:: Patient Choice offered to / list presented to : Patient [place] ownership interest in [facility].provided to:: Patient    Expected Discharge Plan and Services In-house Referral: Clinical Social Work Discharge Planning Services: CM Consult Post Acute Care Choice: Home Health Living arrangements for the past 2 months: Single Family Home                           HH Arranged: PT, OT HH Agency: [facility]) Date HH Agency Contacted: 11/30/22   Representative spoke with at [facility] Agency: Morrie Sheldon  Prior Living Arrangements/Services Living arrangements for the past 2 months: Single Family Home Lives with:: Adult Children Patient language and need for interpreter reviewed:: Yes Do you feel safe going back to the place where you live?: Yes      Need for Family Participation in Patient Care: Yes (Comment) Care giver support system in place?: Yes (comment) Current home services: DME Criminal Activity/Legal Involvement Pertinent to Current Situation/Hospitalization: No - Comment as needed  Activities of Daily Living   ADL Screening (condition at time of  admission) Independently performs ADLs?: No Does the patient have a NEW difficulty with bathing/dressing/toileting/self-feeding that is expected to last >3 days?: No Does the patient have a NEW difficulty with getting in/out of bed, walking, or climbing stairs that is expected to last >3 days?: No Does the patient have a NEW difficulty with communication that is expected to last >3 days?: No Is the patient deaf or have difficulty hearing?: Yes Does the patient have difficulty seeing, even when wearing glasses/contacts?: No Does the patient have difficulty concentrating, remembering, or making decisions?: No  Permission Sought/Granted                  Emotional Assessment Appearance:: Appears stated age Attitude/Demeanor/Rapport: Engaged Affect (typically observed): Accepting Orientation: : Oriented to Self, Oriented to Place, Oriented to Situation, Oriented to  Time Alcohol / Substance Use: Not Applicable Psych Involvement: No (comment)  Admission diagnosis:  CAP (community acquired pneumonia) [J18.9] AKI (acute kidney injury) (HCC) [N17.9] Pneumonia due to infectious organism, unspecified laterality, unspecified part of lung [J18.9] Acute respiratory failure with hypoxia (HCC) [J96.01] Patient Active Problem List   Diagnosis Date Noted   AKI (acute kidney injury) (HCC) 11/28/2022   Hyperkalemia 11/28/2022   Hypotension 08/28/2022   Dehydration 08/28/2022   Hypokalemia 08/28/2022   Hip pain 08/28/2022   Lactic acidosis 08/28/2022   Closed fracture of right distal radius 07/28/2022   Pressure injury of skin 07/26/2022   Closed L1 vertebral fracture (HCC) 07/26/2022   Fall 07/25/2022   Compression  fracture of L1,L2 vertebra (HCC) 07/25/2022   Influenza A 04/28/2022   Hypoxia 04/23/2022   Anemia, unspecified 11/23/2021   Long term (current) use of inhaled steroids 11/23/2021   Unspecified dementia, unspecified severity, with mood disturbance (HCC) 11/23/2021   Fracture of  femoral neck, left, closed (HCC) 09/24/2021   Acute respiratory failure with hypoxia (HCC) 06/10/2021   Tobacco use disorder 06/10/2021   Generalized weakness 06/10/2021   Bilateral conjunctivitis 06/10/2021   Chronic diastolic CHF (congestive heart failure) (HCC) 05/14/2021   Depression 05/14/2021   Hyperlipidemia    COPD exacerbation (HCC) 05/13/2021   Long term (current) use of opiate analgesic 01/04/2021   Chronic pain 04/23/2019   General unsteadiness 04/23/2019   Polyarthropathy 04/23/2019   Pseudobulbar affect 04/23/2019   Tremor 04/23/2019   Chronic obstructive pulmonary disease, unspecified (HCC)    Gastro-esophageal reflux disease without esophagitis    Hyperlipidemia, unspecified    Pulmonary fibrosis, unspecified (HCC)    Acute on chronic diastolic (congestive) heart failure (HCC)    Age-related osteoporosis without current pathological fracture    Anxiety disorder, unspecified    Chest pain, unspecified    Chronic diastolic (congestive) heart failure (HCC)    Collapsed vertebra, not elsewhere classified, site unspecified, subsequent encounter for fracture with routine healing    Edema, unspecified    Lumbago with sciatica    Overactive bladder    Pain in unspecified hand    Pressure ulcer of unspecified site, unspecified stage    Unspecified protein-calorie malnutrition (HCC)    Acute diastolic heart failure (HCC) 09/30/2018   Dyspnea 09/29/2018   Pneumonia 06/30/2018   Acute CHF (congestive heart failure) (HCC) 06/29/2018   Vascular dementia without behavioral disturbance (HCC) 06/26/2018   CAP (community acquired pneumonia) 06/23/2018   Bilateral pleural effusion 06/23/2018   Leukocytosis 06/20/2018   Chronic anemia 06/20/2018   Dementia (HCC) 06/19/2018   Bilateral lower extremity edema 06/18/2018   Protein-calorie malnutrition, severe (HCC) 06/18/2018   S/P internal fixation right hip fracture cannulated screw placement 04/06/18 04/27/2018   UTI (urinary  tract infection) 04/11/2018   Altered mental status 04/10/2018   Anxiety 04/10/2018   Hyponatremia 04/10/2018   UTI (urinary tract infection) due to urinary indwelling catheter (HCC) 04/10/2018   Chronic constipation 04/09/2018   Urinary retention 04/09/2018   Closed right hip fracture (HCC) 04/05/2018   GERD (gastroesophageal reflux disease) 04/05/2018   COPD with acute exacerbation (HCC) 04/05/2018   Hiatal hernia    Dyspepsia 04/04/2014   Hematochezia 04/30/2011   RUQ pain 03/25/2011   Adenomatous polyps 03/25/2011   ABDOMINAL PAIN, LEFT LOWER QUADRANT 10/16/2009   HYPERLIPIDEMIA 10/09/2009   BRONCHITIS 10/09/2009   Osteoporosis 10/09/2009   Personal history of pneumonia (recurrent) 01/05/1999   Mixed hyperlipidemia 01/05/1999   PCP:  Lynnea Ferrier, MD Pharmacy:   [facility] - [place], [place] - [address] [place] [address] Phone: [phone] Fax: [phone]  [facility] - [address] [place], [place] - [address] [place] [address] Phone: [phone] Fax: [phone]  [facility] [phone] - [place], [place] - [address] [facility] [place] [address] [place] [address] Phone: [phone] Fax: [phone]     Social Determinants of Health (SDOH) Social History: SDOH Screenings   Food Insecurity: No Food Insecurity (11/28/2022)  Housing: Low Risk  (11/28/2022)  Transportation Needs: No Transportation Needs (11/28/2022)  Utilities:  Not At Risk (11/28/2022)  Financial Resource Strain: Low Risk  (09/08/2022)   Received from [facility]  Tobacco Use: High Risk (11/28/2022)   SDOH Interventions:     Readmission Risk Interventions    11/29/2022    1:31 PM 07/26/2022   12:28 PM 09/30/2021    9:22 AM  Readmission Risk Prevention Plan  Transportation Screening Complete Complete Complete  PCP or Specialist Appt within 5-7 Days   Not Complete   Home Care Screening  Complete   Medication Review (RN CM)  Complete   Medication Review (RN Care Manager) Complete  Complete  HRI or Home Care Consult Complete  Complete  SW Recovery Care/Counseling Consult Complete  Complete  Palliative Care Screening Not Applicable  Not Applicable  Skilled Nursing Facility Not Applicable  Complete

## 2022-11-30 NOTE — Progress Notes (Signed)
Mobility Specialist Progress Note:    11/30/22 1000  Mobility  Activity Ambulated with assistance in hallway  Level of Assistance Contact guard assist, steadying assist  Assistive Device Front wheel walker  Distance Ambulated (ft) 50 ft  Range of Motion/Exercises Active;All extremities  Activity Response Tolerated well  Mobility Referral Yes  $Mobility charge 1 Mobility  Mobility Specialist Start Time (ACUTE ONLY) 0950  Mobility Specialist Stop Time (ACUTE ONLY) 1010  Mobility Specialist Time Calculation (min) (ACUTE ONLY) 20 min   Pt received in chair, agreeable to mobility. Required CGA to stand and ambulate with RW. Tolerated well, asx throughout. Returned to room, left in chair. Call bell in reach, all needs met.   Lawerance Bach Mobility Specialist Please contact via Special educational needs teacher or  Rehab office at [phone]

## 2022-11-30 NOTE — Progress Notes (Signed)
Patient daughter and granddaughter here earlier this shift , requested to see Dr. Gwenlyn Perking, I paged Dr. Gwenlyn Perking he came to bedside at their request and they were gone when he arrived. He did place discharge order and patients daughter " [name] " was called by this Clinical research associate to inform her of her mother's discharge she then stated she was on the other side of [place] and needed to get her Mom's caregiver , Chales Abrahams, RN was then notified to make aware that her daughter and Legal guardian would be unable to come back and pick her up today .

## 2022-11-30 NOTE — Plan of Care (Signed)
  Problem: Education: Goal: Knowledge of General Education information will improve Description Including pain rating scale, medication(s)/side effects and non-pharmacologic comfort measures Outcome: Progressing   Problem: Health Behavior/Discharge Planning: Goal: Ability to manage health-related needs will improve Outcome: Progressing   

## 2022-12-01 DIAGNOSIS — K219 Gastro-esophageal reflux disease without esophagitis: Secondary | ICD-10-CM | POA: Diagnosis not present

## 2022-12-01 DIAGNOSIS — N179 Acute kidney failure, unspecified: Secondary | ICD-10-CM | POA: Diagnosis not present

## 2022-12-01 DIAGNOSIS — J438 Other emphysema: Secondary | ICD-10-CM | POA: Diagnosis not present

## 2022-12-01 DIAGNOSIS — J189 Pneumonia, unspecified organism: Secondary | ICD-10-CM | POA: Diagnosis not present

## 2022-12-01 NOTE — Plan of Care (Signed)
  Problem: Education: Goal: Knowledge of General Education information will improve Description Including pain rating scale, medication(s)/side effects and non-pharmacologic comfort measures Outcome: Progressing   Problem: Health Behavior/Discharge Planning: Goal: Ability to manage health-related needs will improve Outcome: Progressing   

## 2022-12-01 NOTE — Plan of Care (Signed)

## 2022-12-01 NOTE — Progress Notes (Signed)
PROGRESS NOTE  [name], is a 88 y.o. female, DOB - [birth year], UYQ:[id number]  Admit date - 11/27/2022   Admitting Physician Lacey Savage Mariea Clonts, MD  Outpatient Primary MD for the patient is Lacey Ferrier, MD  LOS - 3  Chief Complaint  Patient presents with   Weakness      Brief Narrative:  88 y.o. female with medical history significant of anxiety, CHF, COPD, GERD, hyperlipidemia, pulmonary fibrosis/ILD admitted on 11/28/2022 with acute hypoxic respiratory failure  Pt was discharged by Dr. Gwenlyn [name] on 11/30/2022, but She had no transportation to get home until 12/01/2022 Please see full discharge summary from Dr. Gwenlyn [name] dated 11/30/2022   -Assessment and Plan: 1)Pulmonary fibrosis/ILD flareup---  Respiratory status improved significantly after treatment with IV Solu-Medrol ,bronchodilators and mucolytics  -Initially received antibiotics CT chest with contrast without definite pneumonia. -PCT < 0.10, WBC 6.5 ; patient afebrile. -Please see full discharge summary from Dr. Gwenlyn [name] dated 11/30/2022  2) acute hypoxic respiratory failure -Due to #1 above -Hypoxia resolved after treatment as noted above #1 -Currently on room air  3)AKI----acute kidney injury with hyperkalemia -creatinine on admission=1.57  ,  baseline creatinine= 0.89 (08/29/22)   ,  -Creatinine is back to 1.0, - AKI and hyperkalemia has resolved  4)Hyperlipidemia, unspecified - Continue simvastatin -Heart healthy diet discussed with patient.  5)Gastro-esophageal reflux disease without esophagitis - Continue PPI.  6) chronic anemia--Hgb is not far from baseline -No overt bleeding appreciated  7) diarrhea -Patient was treated with as needed Lomotil and Florastor  -No further diarrhea  Status is: Inpatient   Disposition: The patient is from: Home              Anticipated d/c is to: Home              Anticipated d/c date is: 24 hours.      Code Status :  -  Code Status: Limited: Do not attempt  resuscitation (DNR) -DNR-LIMITED -Do Not Intubate/DNI    Family Communication:    (patient is alert, awake and coherent)  DVT Prophylaxis  :   - SCDs  heparin injection 5,000 Units Start: 11/28/22 0600 SCDs Start: 11/28/22 0307   Lab Results  Component Value Date   PLT 217 11/29/2022    Inpatient Medications  Scheduled Meds:  dextromethorphan-guaiFENesin  1 tablet Oral BID   donepezil  10 mg Oral Daily   fluticasone furoate-vilanterol  1 puff Inhalation Daily   heparin  5,000 Units Subcutaneous Q8H   ipratropium-albuterol  3 mL Nebulization BID   memantine  10 mg Oral BID   pantoprazole  40 mg Oral BID AC   saccharomyces boulardii  250 mg Oral BID WC   simvastatin  10 mg Oral Daily   zolpidem  5 mg Oral QHS   Continuous Infusions:   PRN Meds:.acetaminophen **OR** acetaminophen, albuterol, ALPRAZolam, diphenoxylate-atropine, fentaNYL (SUBLIMAZE) injection, ondansetron **OR** ondansetron (ZOFRAN) IV, oxyCODONE   Anti-infectives (From admission, onward)    Start     Dose/Rate Route Frequency Ordered Stop   11/28/22 2200  azithromycin (ZITHROMAX) 500 mg in sodium chloride 0.9 % 250 mL IVPB        500 mg 250 mL/hr over 60 Minutes Intravenous Every 24 hours 11/28/22 1245 11/28/22 2256   11/28/22 2100  cefTRIAXone (ROCEPHIN) 2 g in sodium chloride 0.9 % 100 mL IVPB        2 g 200 mL/hr over 30 Minutes Intravenous Every 24 hours 11/28/22 0306 11/28/22 2123   11/27/22  2115  cefTRIAXone (ROCEPHIN) 2 g in sodium chloride 0.9 % 100 mL IVPB        2 g 200 mL/hr over 30 Minutes Intravenous  Once 11/27/22 2105 11/27/22 2146   11/27/22 2115  azithromycin (ZITHROMAX) 500 mg in sodium chloride 0.9 % 250 mL IVPB  Status:  Discontinued        500 mg 250 mL/hr over 60 Minutes Intravenous Every 24 hours 11/27/22 2105 11/28/22 1245         Subjective: Pt was discharged by Dr. Gwenlyn [name] on 11/30/2022, but She had no transportation to get home until 12/01/2022 Please see full discharge  summary from Dr. Gwenlyn [name] dated 11/30/2022  -Patient is seen and evaluated prior to discharge -Manage medically stable for discharge home today -No hypoxia  Objective: Vitals:   11/30/22 1953 12/01/22 0433 12/01/22 0757 12/01/22 0758  BP: 125/75 125/88    Pulse: 69 74    Resp: 20 17    Temp: 98.1 F (36.7 C) 97.9 F (36.6 C)    TempSrc: Oral     SpO2: 100% 95% 100% 100%  Weight:      Height:        Intake/Output Summary (Last 24 hours) at 12/01/2022 1004 Last data filed at 12/01/2022 0500 Gross per 24 hour  Intake --  Output 1700 ml  Net -1700 ml   Filed Weights   11/27/22 1940 11/28/22 0256  Weight: 47.6 kg 51.1 kg    Physical Exam  Gen:- Awake Alert, in no acute distress , speaking in complete sentences HEENT:- [place].AT, No sclera icterus Neck-Supple Neck,No JVD,.  Lungs-improved air movement, no wheezing CV- S1, S2 normal, RRR Abd-  +ve B.Sounds, Abd Soft, No tenderness,    Extremity/Skin:- No  edema,   good pedal pulses  Psych-affect is appropriate, oriented x3 Neuro-no new focal deficits, no tremors   CBC: Recent Labs  Lab 11/27/22 2101 11/28/22 0453 11/29/22 0430  WBC 6.5 6.1 5.9  NEUTROABS 4.1 3.7  --   HGB 10.0* 9.3* 10.0*  HCT 32.3* 30.3* 32.9*  MCV 91.5 89.6 90.9  PLT 213 186 217   Basic Metabolic Panel: Recent Labs  Lab 11/27/22 2101 11/28/22 0453 11/29/22 0430  NA 137 136 139  K 5.6* 5.0 5.1  CL 101 100 104  CO2 30 28 25   GLUCOSE 82 100* 126*  BUN 29* 24* 19  CREATININE 1.57* 1.22* 1.04*  CALCIUM 8.7* 8.6* 8.8*  MG  --  2.6*  --    GFR: Estimated Creatinine Clearance: 25.9 mL/min (A) (by C-G formula based on SCr of 1.04 mg/dL (H)).  Liver Function Tests: Recent Labs  Lab 11/27/22 2101 11/28/22 0453  AST 24 23  ALT 17 15  ALKPHOS 87 72  BILITOT 0.4 0.5  PROT 6.8 6.1*  ALBUMIN 3.5 3.0*   Recent Results (from the past 240 hour(s))  SARS Coronavirus 2 by RT PCR (hospital order, performed in [facility] hospital lab)  *cepheid single result test* Anterior Nasal Swab     Status: None   Collection Time: 11/27/22 10:42 PM   Specimen: Anterior Nasal Swab  Result Value Ref Range Status   SARS Coronavirus 2 by RT PCR NEGATIVE NEGATIVE Final    Comment: (NOTE) SARS-CoV-2 target nucleic acids are NOT DETECTED.  The SARS-CoV-2 RNA is generally detectable in upper and lower respiratory specimens during the acute phase of infection. The lowest concentration of SARS-CoV-2 viral copies this assay can detect is 250 copies / mL. A negative result  does not preclude SARS-CoV-2 infection and should not be used as the sole basis for treatment or other patient management decisions.  A negative result may occur with improper specimen collection / handling, submission of specimen other than nasopharyngeal swab, presence of viral mutation(s) within the areas targeted by this assay, and inadequate number of viral copies (<250 copies / mL). A negative result must be combined with clinical observations, patient history, and epidemiological information.  Fact Sheet for Patients:   RoadLapTop.co.za  Fact Sheet for Healthcare Providers: http://kim-miller.com/  This test is not yet approved or  cleared by the [place] FDA and has been authorized for detection and/or diagnosis of SARS-CoV-2 by FDA under an Emergency Use Authorization (EUA).  This EUA will remain in effect (meaning this test can be used) for the duration of the COVID-19 declaration under Section 564(b)(1) of the Act, 21 U.S.C. section 360bbb-3(b)(1), unless the authorization is terminated or revoked sooner.  Performed at [facility], [address] [place], [place] [address]     Radiology Studies: No results found.  Scheduled Meds:  dextromethorphan-guaiFENesin  1 tablet Oral BID   donepezil  10 mg Oral Daily   fluticasone furoate-vilanterol  1 puff Inhalation Daily   heparin  5,000 Units Subcutaneous  Q8H   ipratropium-albuterol  3 mL Nebulization BID   memantine  10 mg Oral BID   pantoprazole  40 mg Oral BID AC   saccharomyces boulardii  250 mg Oral BID WC   simvastatin  10 mg Oral Daily   zolpidem  5 mg Oral QHS   Continuous Infusions:    LOS: 3 days   Shon Hale M.D on 12/01/2022 at 10:04 AM  Go to [email or web address] - for contact info  [facility] - Office  [phone]  If 7PM-7AM, please contact night-coverage [email or web address] 12/01/2022, 10:04 AM

## 2022-12-01 NOTE — Progress Notes (Signed)
Mobility Specialist Progress Note:    12/01/22 0945  Mobility  Activity Ambulated with assistance in hallway;Stood at bedside  Level of Assistance Contact guard assist, steadying assist  Assistive Device Front wheel walker  Distance Ambulated (ft) 50 ft  Range of Motion/Exercises Active;All extremities  Activity Response Tolerated well  Mobility Referral Yes  $Mobility charge 1 Mobility  Mobility Specialist Start Time (ACUTE ONLY) 0945  Mobility Specialist Stop Time (ACUTE ONLY) 1005  Mobility Specialist Time Calculation (min) (ACUTE ONLY) 20 min   Pt received in chair, NT in room. Required CGA to stand and SBA to ambulate with RW. Tolerated well, asx throughout. Returned to room, left in chair. MD in room, alarm on. All needs met.   Lawerance Bach Mobility Specialist Please contact via Special educational needs teacher or  Rehab office at [phone]

## 2022-12-01 NOTE — Care Management Important Message (Signed)
Important Message  Patient Details  Name: [name] MRN: [id number] Date of Birth: Dec 09, 1934   Important Message Given:  Yes - Medicare IM (reviewed letter with Ms. Riecke by phone, no additonal copy needed)     Corey Harold 12/01/2022, 10:22 AM

## 2023-01-08 ENCOUNTER — Encounter: Payer: Self-pay | Admitting: Orthopedic Surgery

## 2023-01-17 ENCOUNTER — Encounter: Payer: Self-pay | Admitting: Orthopedic Surgery

## 2023-01-20 ENCOUNTER — Encounter: Payer: Self-pay | Admitting: Cardiology

## 2023-01-21 ENCOUNTER — Telehealth: Payer: Self-pay | Admitting: Cardiology

## 2023-01-21 MED ORDER — MIDODRINE HCL 5 MG PO TABS: 5.0000 mg | ORAL_TABLET | Freq: Two times a day (BID) | ORAL | 11 refills | Status: DC

## 2023-01-21 NOTE — Telephone Encounter (Signed)
[name] notified that medication has been sent in.

## 2023-01-21 NOTE — Telephone Encounter (Signed)
Pt c/o medication issue:  1. Name of Medication: Midodrine, 5 mg  2. How are you currently taking this medication (dosage and times per day)?   Not started yet  3. Are you having a reaction (difficulty breathing--STAT)?   4. What is your medication issue?    Daughter [name]) stated patient was to be prescribed this medication and wants prescription sent to [facility] [name] - [place], [place] - [address]  See previous note.  Daughter wants a call back or leave message in MyChart to confirm prescription has been sent.

## 2023-02-02 ENCOUNTER — Ambulatory Visit: Payer: Medicare Other | Admitting: Orthopedic Surgery

## 2023-02-02 DIAGNOSIS — M25561 Pain in right knee: Secondary | ICD-10-CM | POA: Diagnosis not present

## 2023-02-02 DIAGNOSIS — M25512 Pain in left shoulder: Secondary | ICD-10-CM

## 2023-02-02 DIAGNOSIS — G8929 Other chronic pain: Secondary | ICD-10-CM

## 2023-02-02 MED ORDER — METHYLPREDNISOLONE ACETATE 40 MG/ML IJ SUSP
40.0000 mg | Freq: Once | INTRAMUSCULAR | Status: AC
  Administered 2023-02-02: 40 mg via INTRA_ARTICULAR

## 2023-02-02 NOTE — Patient Instructions (Signed)

## 2023-02-02 NOTE — Progress Notes (Signed)
Orthopaedic Clinic Return  Assessment: [name] is a 89 y.o. female with the following: Left shoulder pain Right knee pain  Plan: [name] has left shoulder pain, as well as right knee pain.  Prior injections were helpful until the last couple of weeks.  Repeat injections were completed today.  These were tolerated without issues.  She will return to clinic as needed.  Procedure note injection Left shoulder    Verbal consent was obtained to inject the left shoulder, subacromial space Timeout was completed to confirm the site of injection.  The skin was prepped with alcohol and ethyl chloride was sprayed at the injection site.  A 21-gauge needle was used to inject 40 mg of Depo-Medrol and 1% lidocaine (4 cc) into the subacromial space of the left shoulder using a posterolateral approach.  There were no complications. A sterile bandage was applied.   Procedure note injection Right knee joint   Verbal consent was obtained to inject the right knee joint  Timeout was completed to confirm the site of injection.  The skin was prepped with alcohol and ethyl chloride was sprayed at the injection site.  A 21-gauge needle was used to inject 40 mg of Depo-Medrol and 1% lidocaine (4 cc) into the right knee using an anterolateral approach.  There were no complications. A sterile bandage was applied.   Follow-up: Return if symptoms worsen or fail to improve.   Subjective:  Chief Complaint  Patient presents with   Injections    Right knee left shoulder     History of Present Illness: [name] is a 89 y.o. female who returns to clinic for evaluation of the right knee and the left shoulder pain.  She is well-known to my clinic.  I last saw her in clinic about 3 months ago.  We injected both the right knee and left shoulder at that time.  These were very helpful.  Over the past couple weeks, she has had recurrence of her pain.   Review of Systems: No fevers or chills No numbness  or tingling No chest pain No shortness of breath No bowel or bladder dysfunction No GI distress No headaches   Objective: There were no vitals taken for this visit.  Physical Exam:  Seated in a wheelchair.  Hard of hearing.  Left shoulder without deformity.  There is atrophy.  No redness.  No swelling.  She has difficulty with overhead motion.  Right knee with mild effusion.  Tenderness palpation medial lateral joint lines.  She is only able to achieve full extension, range of motion from 10-100 degrees.  Negative Lachman.  IMAGING: I personally ordered and reviewed the following images:  No new imaging obtained today.   Oliver Barre, MD 02/02/2023 11:45 AM

## 2023-02-24 ENCOUNTER — Ambulatory Visit: Payer: Medicare Other | Attending: Cardiology | Admitting: Cardiology

## 2023-02-24 ENCOUNTER — Encounter: Payer: Self-pay | Admitting: Cardiology

## 2023-02-24 VITALS — BP 118/70 | HR 76 | Ht 61.0 in | Wt 107.0 lb

## 2023-02-24 DIAGNOSIS — I5032 Chronic diastolic (congestive) heart failure: Secondary | ICD-10-CM

## 2023-02-24 NOTE — Patient Instructions (Addendum)
Medication Instructions:  Your physician recommends that you continue on your current medications as directed. Please refer to the Current Medication list given to you today.  Labwork: none  Testing/Procedures: none  Follow-Up: Your physician recommends that you schedule a follow-up appointment in: 6 month  Any Other Special Instructions Will Be Listed Below (If Applicable).  If you need a refill on your cardiac medications before your next appointment, please call your pharmacy. 

## 2023-02-24 NOTE — Progress Notes (Signed)
   Virtual Visit via Telephone Note   Because of [name] co-morbid illnesses, she is at least at moderate risk for complications without adequate follow up.  This format is felt to be most appropriate for this patient at this time.  The patient did not have access to video technology/had technical difficulties with video requiring transitioning to audio format only (telephone).  All issues noted in this document were discussed and addressed.  No physical exam could be performed with this format.  Please refer to the patient's chart for her consent to telehealth for [facility].    Date:  02/24/2023   ID:  [name], DOB 08/12/1934, MRN [id number] The patient was identified using 2 identifiers.  Patient Location: Home Provider Location: Office/Clinic  Evaluation Performed:  Follow-Up Visit  Chief Complaint:  Routine cardiac follow-up  History of Present Illness:    [name] is an 89 y.o. female last assessed via telehealth encounter in September 2024.  She continues to reside with her daughter who is her primary caregiver and with whom I spoke today as the patient's advocate.  She tells me that [name] has done better in terms of blood pressure since we added ProAmatine.  She is using Lasix with potassium supplement on average three days a week.  Has PT coming in once a week and still sees her PCP.  Dementia has been progressing.  I reviewed her present medications.  Remains on Lasix 40 mg as needed for fluid retention, ProAmatine 5 mg twice daily, KCl 10 mEq as needed with Lasix, and Zocor 10 mg daily.  I reviewed interval lab work as noted below.   Prior CV studies:    No interval cardiac testing for review today.  Labs/Other Tests and Data Reviewed:    EKG:  An ECG dated 08/10/2022 was personally reviewed today and demonstrated:  Sinus rhythm with prolonged PR interval.  Recent Labs: September 2024: Cholesterol 219, triglycerides 104, HDL 66, LDL  132 11/27/2022: B Natriuretic Peptide 117.0 11/28/2022: ALT 15; Magnesium 2.6 11/29/2022: BUN 19; Creatinine, Ser 1.04; Hemoglobin 10.0; Platelets 217; Potassium 5.1; Sodium 139    Wt Readings from Last 3 Encounters:  02/24/23 107 lb (48.5 kg)  11/28/22 112 lb 10.5 oz (51.1 kg)  09/23/22 106 lb (48.1 kg)        Objective:    Vital Signs:  BP 118/70   Pulse 76   Ht 5\' 1"  (1.549 m)   Wt 107 lb (48.5 kg)   SpO2 100%   BMI 20.22 kg/m     ASSESSMENT & PLAN:    1.  HFpEF, LVEF 55 to 60% by echocardiogram in April 2024, normal estimated RVSP as well.  Discussed situation with the patient's daughter.  Plan to continue Lasix with potassium supplement on an as-needed basis rather than standing.  I reviewed interval lab work.  Blood pressure has been more stable since addition of ProAmatine.   2.  Dementia, on Aricept and Namenda.  This has been progressive.   3.  COPD.  She continues to follow with pulmonologist.   4.  Mixed hyperlipidemia, on Zocor.     Time:   Today, I have spent 8 minutes with the patient with telehealth technology discussing the above problems.     Follow Up:  Virtual Visit   6 months.  Signed, Nona Dell, MD  02/24/2023 3:57 PM    [facility]

## 2023-03-09 ENCOUNTER — Encounter: Payer: Self-pay | Admitting: Cardiology

## 2023-03-10 ENCOUNTER — Telehealth: Payer: Self-pay | Admitting: Cardiology

## 2023-03-10 NOTE — Telephone Encounter (Signed)
 Follow Up:     Patient daughter said she sent a message yesterday by My Chart. She said she need to know something asap please.

## 2023-03-10 NOTE — Telephone Encounter (Signed)
 Responded via [employer]

## 2023-03-11 ENCOUNTER — Ambulatory Visit: Attending: Nurse Practitioner | Admitting: Nurse Practitioner

## 2023-03-11 ENCOUNTER — Encounter: Payer: Self-pay | Admitting: Nurse Practitioner

## 2023-03-11 VITALS — BP 118/84 | HR 92 | Wt 109.6 lb

## 2023-03-11 DIAGNOSIS — I5033 Acute on chronic diastolic (congestive) heart failure: Secondary | ICD-10-CM

## 2023-03-11 DIAGNOSIS — F0390 Unspecified dementia without behavioral disturbance: Secondary | ICD-10-CM

## 2023-03-11 DIAGNOSIS — I5032 Chronic diastolic (congestive) heart failure: Secondary | ICD-10-CM

## 2023-03-11 DIAGNOSIS — Z79899 Other long term (current) drug therapy: Secondary | ICD-10-CM

## 2023-03-11 DIAGNOSIS — R600 Localized edema: Secondary | ICD-10-CM | POA: Diagnosis not present

## 2023-03-11 DIAGNOSIS — E861 Hypovolemia: Secondary | ICD-10-CM | POA: Diagnosis not present

## 2023-03-11 MED ORDER — FUROSEMIDE 40 MG PO TABS: 40.0000 mg | ORAL_TABLET | Freq: Every day | ORAL | Status: DC | PRN

## 2023-03-11 NOTE — Patient Instructions (Addendum)
 Medication Instructions:  Your physician has recommended you make the following change in your medication:  Take furosemide 40 mg daily for 3 days, then change to daily as needed. You may take an extra midodrine 5 mg daily as needed for systolic blood pressure (top number) less than 90 Continue all other medications as prescribed  Labwork: BMET, BNP and Magnesium levels in 1-2 weeks. Non-fasting Orders faxed to [facility] [phone])  Testing/Procedures: none  Follow-Up: Your physician recommends that you schedule a follow-up appointment in: 4-6 weeks for a phone visit.  Any Other Special Instructions Will Be Listed Below (If Applicable). Please contact our office Monday or Tuesday via mychart with an update on your swelling.  If you need a refill on your cardiac medications before your next appointment, please call your pharmacy.

## 2023-03-11 NOTE — Telephone Encounter (Signed)
 Spoke with daughter [name]) this morning - she is c/o swelling in her ankles & feet.  States this has been going on x 5-6 days.  Was originally doing Furosemide 40mg  as needed but states she decreased it to 20mg  due to it "wiping her out".  States she had been doing daily up till 03/05/2023.  Also states that she was told to only take the Midodrine when she uses the Furosemide.  Thinks she may have gained about 3 lb since 03/05/2023.  BP  103/84  HR running 88-90.  Daughter states that patient does have dementia so it is hard to tell when she truly has SOB because she says that occasionally when she wants attention.  Due to see pcp in April - sees them every 3 months.  States that her feet and ankles are really puffy and indents when pressing down on them close to her toes.  States she did not think she could wear compression stockings because she has sores in between her toes that she has had for quite some time.  Will send message to NP to see if she feels ok to add to her scheduled this evening if they could possibly come.

## 2023-03-11 NOTE — Progress Notes (Signed)
 Virtual Visit via Telephone Note   Because of [name] co-morbid illnesses, she is at least at moderate risk for complications without adequate follow up.  This format is felt to be most appropriate for this patient at this time.  The patient did not have access to video technology/had technical difficulties with video requiring transitioning to audio format only (telephone).  All issues noted in this document were discussed and addressed.  No physical exam could be performed with this format.  Please refer to the patient's chart for her consent to telehealth for [facility].    Date:  03/11/2023  ID:  [name], DOB 23-Aug-1934, MRN [id number] The patient was identified using 2 identifiers.  Patient Location: Home Provider Location: Office/Clinic   PCP:  Lynnea Ferrier, MD   [facility] Providers Cardiologist:  Nona Dell, MD     Evaluation Performed:  Follow-Up Visit  Chief Complaint:  Leg swelling   History of Present Illness:    [name] is a 89 y.o. female with a PMH of HFpEF, COPD, and dementia, who is here for leg swelling evaluation.  HPI is provided by patient's daughter.  Patient is a poor historian due to history of dementia.  Last time she was seen in the office was in 2022.  Last evaluated via telephone visit by Dr. Diona Browner was on September 23, 2022.  She had been hospitalized in August 2024 after fall from wheelchair.  At that time, was currently residing at [facility].  No fractures noted.  Hypotension and electrolyte abnormalities were stabilized.  SNF offered, family wanted to bring patient home.  Overall was doing well at the time.  Was working with PT at home, denied any recurrent falls.  Our office was recently contacted by daughter noting some concerns.  Daughter is historian and says previous fluid pill was "draining her," did not want to do anything.  Daughter says she stopped giving patient diuretic,  however leg swelling has gotten progressively worse.  Does also admit to blood pressure dropping with diuretic.  She was previously advised by Dr. Diona Browner to give 5 mg of midodrine when taking the diuretic.  Daughter states SBP dropped to 70s with Lasix.  Blood pressure stable at this time.  Daughter says she is having a challenging time trying to encourage her mother to drink fluids, she has also been eating a lot of salty foods including [employer] de[employer] biscuits. Denies any chest pain, shortness of breath, palpitations, syncope, presyncope, dizziness, orthopnea, PND, acute bleeding, or claudication.  She says she is showing more fluid retention due to her heart failure and wants to know what she should do regarding her mother's medications. ROS:   Please see the history of present illness.    All other systems reviewed and are negative.   Prior CV studies:   The following studies were reviewed today:  Echo 04/2022: 1. Left ventricular ejection fraction, by estimation, is 55 to 60%. The  left ventricle has normal function. The left ventricle has no regional  wall motion abnormalities. There is moderate asymmetric left ventricular  hypertrophy of the basal-septal  segment. Left ventricular diastolic parameters are consistent with Grade I diastolic dysfunction (impaired relaxation).   2. Right ventricular systolic function is normal. The right ventricular  size is normal. There is normal pulmonary artery systolic pressure. The  estimated right ventricular systolic pressure is 31.5 mmHg.   3. Left atrial size was severely dilated.  4. The mitral valve is degenerative. Trivial mitral valve regurgitation.  No evidence of mitral stenosis.   5. The aortic valve is abnormal. There is mild calcification of the  aortic valve. There is mild thickening of the aortic valve. Aortic valve  regurgitation is not visualized. Aortic valve sclerosis/calcification is  present, without any evidence of aortic    stenosis.   6. The inferior vena cava is normal in size with greater than 50%  respiratory variability, suggesting right atrial pressure of 3 mmHg.  Labs/Other Tests and Data Reviewed:    EKG: EKG is not ordered today.   Objective:    Vital Signs:  BP 118/84   Pulse 92   Wt 109 lb 9.6 oz (49.7 kg)   BMI 20.71 kg/m    Due to nature of today's visit, a physical exam was unable to be performed.  ASSESSMENT & PLAN:    Acute on chronic HFpEF, leg edema, medication management Stage C, unable to determine NYHA classification as patient is unable to provide history or explain her symptoms due to her dementia.  Daughter describes progressive leg edema that sounds like she is volume overloaded.  She also admits to dietary indiscretions-has had recent intake of foods with high sodium content.  Instructed her to take Lasix 40 mg daily for the next 3 to 4 days and also take midodrine as previously prescribed by Dr. Diona Browner (midodrine 5 mg twice daily) to prevent BP from dropping-see below. Low sodium diet, fluid restriction <2L, and daily weights encouraged. Educated to contact our office for weight gain of 2 lbs overnight or 5 lbs in one week.  We will contact Home Health agency ([facility] in [place]) to obtain labs in the next 1 to 2 weeks-Home health RN will come to house to draw labs including: BMET, BNP, and magnesium level. No other medication changes at this time.  Daughter will update [place] in the next 3 to 4 days via MyChart regarding her weight trends/leg swelling.  She verbalized understanding.  Hypotension due to hypovolemia She describes blood pressure dropping with Lasix.  Instructed to continue midodrine and to encourage regular fluid intake.  Instructed she may give an additional 5 mg during the day if needed for SBP less than 90.  Daughter verbalized understanding. Discussed to monitor BP at home at least 2 hours after medications and sitting for 5-10 minutes.  Care and ED  precautions discussed.  No other medication changes at this time.  Dementia Patient has progressive dementia.  She is unable to make office appointments at this time.  Continue to follow-up with PCP and home health agency.  Time:   Today, I have spent 12 minutes with the patient with telehealth technology discussing the above problems.     Medication Adjustments/Labs and Tests Ordered: Current medicines are reviewed at length with the patient today.  Concerns regarding medicines are outlined above.   Tests Ordered: No orders of the defined types were placed in this encounter.   Medication Changes: No orders of the defined types were placed in this encounter.   Follow Up:  Virtual Visit  in 4-6 weeks  Signed, Sharlene Dory, NP  03/11/2023 1:28 PM    [facility]

## 2023-03-11 NOTE — Telephone Encounter (Signed)
 Telephone visit given for today.

## 2023-03-15 ENCOUNTER — Telehealth: Payer: Medicare Other | Admitting: Cardiology

## 2023-03-18 ENCOUNTER — Telehealth: Payer: Medicare Other | Admitting: Cardiology

## 2023-03-21 ENCOUNTER — Encounter: Payer: Self-pay | Admitting: Nurse Practitioner

## 2023-04-04 ENCOUNTER — Encounter: Payer: Self-pay | Admitting: Nurse Practitioner

## 2023-04-29 ENCOUNTER — Ambulatory Visit: Attending: Nurse Practitioner | Admitting: Nurse Practitioner

## 2023-04-29 ENCOUNTER — Encounter: Payer: Self-pay | Admitting: Nurse Practitioner

## 2023-04-29 ENCOUNTER — Telehealth: Payer: Self-pay | Admitting: Nurse Practitioner

## 2023-04-29 VITALS — BP 118/72 | HR 84 | Temp 97.2°F | Resp 20 | Wt 106.7 lb

## 2023-04-29 DIAGNOSIS — F0390 Unspecified dementia without behavioral disturbance: Secondary | ICD-10-CM

## 2023-04-29 DIAGNOSIS — R600 Localized edema: Secondary | ICD-10-CM

## 2023-04-29 DIAGNOSIS — E861 Hypovolemia: Secondary | ICD-10-CM | POA: Diagnosis not present

## 2023-04-29 DIAGNOSIS — Z79899 Other long term (current) drug therapy: Secondary | ICD-10-CM | POA: Diagnosis not present

## 2023-04-29 DIAGNOSIS — I5032 Chronic diastolic (congestive) heart failure: Secondary | ICD-10-CM | POA: Diagnosis not present

## 2023-04-29 MED ORDER — FUROSEMIDE 40 MG PO TABS: 40.0000 mg | ORAL_TABLET | Freq: Every day | ORAL | 1 refills | Status: AC

## 2023-04-29 NOTE — Telephone Encounter (Signed)
  Patient Consent for Virtual Visit        [name] has provided verbal consent on 04/29/2023 for a virtual visit (video or telephone).   CONSENT FOR VIRTUAL VISIT FOR:  [name]  By participating in this virtual visit I agree to the following:  I hereby voluntarily request, consent and authorize [facility] and its employed or contracted physicians, physician assistants, nurse practitioners or other licensed health care professionals (the Practitioner), to provide me with telemedicine health care services (the "Services") as deemed necessary by the treating Practitioner. I acknowledge and consent to receive the Services by the Practitioner via telemedicine. I understand that the telemedicine visit will involve communicating with the Practitioner through live audiovisual communication technology and the disclosure of certain medical information by electronic transmission. I acknowledge that I have been given the opportunity to request an in-person assessment or other available alternative prior to the telemedicine visit and am voluntarily participating in the telemedicine visit.  I understand that I have the right to withhold or withdraw my consent to the use of telemedicine in the course of my care at any time, without affecting my right to future care or treatment, and that the Practitioner or I may terminate the telemedicine visit at any time. I understand that I have the right to inspect all information obtained and/or recorded in the course of the telemedicine visit and may receive copies of available information for a reasonable fee.  I understand that some of the potential risks of receiving the Services via telemedicine include:  Delay or interruption in medical evaluation due to technological equipment failure or disruption; Information transmitted may not be sufficient (e.g. poor resolution of images) to allow for appropriate medical decision making by the Practitioner;  and/or  In rare instances, security protocols could fail, causing a breach of personal health information.  Furthermore, I acknowledge that it is my responsibility to provide information about my medical history, conditions and care that is complete and accurate to the best of my ability. I acknowledge that Practitioner's advice, recommendations, and/or decision may be based on factors not within their control, such as incomplete or inaccurate data provided by me or distortions of diagnostic images or specimens that may result from electronic transmissions. I understand that the practice of medicine is not an exact science and that Practitioner makes no warranties or guarantees regarding treatment outcomes. I acknowledge that a copy of this consent can be made available to me via my patient portal [facility] MyChart), or I can request a printed copy by calling the office of [facility].    I understand that my insurance will be billed for this visit.   I have read or had this consent read to me. I understand the contents of this consent, which adequately explains the benefits and risks of the Services being provided via telemedicine.  I have been provided ample opportunity to ask questions regarding this consent and the Services and have had my questions answered to my satisfaction. I give my informed consent for the services to be provided through the use of telemedicine in my medical care

## 2023-04-29 NOTE — Patient Instructions (Addendum)
 Medication Instructions:  Your physician has recommended you make the following change in your medication:  Please Increase Lasix  to 40 Mg daily with an extra 40 Mg tablet daily as needed for swelling, weight gain, or Shortness of Breath   Labwork: Recommendations will be sent to your my chart   Testing/Procedures: None   Follow-Up: Your physician recommends that you schedule a follow-up appointment in: 6 month telephone   Any Other Special Instructions Will Be Listed Below (If Applicable).  If you need a refill on your cardiac medications before your next appointment, please call your pharmacy.

## 2023-04-29 NOTE — Progress Notes (Addendum)
 Virtual Visit via Telephone Note   Because of [name] co-morbid illnesses, she is at least at moderate risk for complications without adequate follow up.  This format is felt to be most appropriate for this patient at this time.  The patient did not have access to video technology/had technical difficulties with video requiring transitioning to audio format only (telephone).  All issues noted in this document were discussed and addressed.  No physical exam could be performed with this format.  Please refer to the patient's chart for her consent to telehealth for [facility].    Date:  04/29/2023  ID:  [name], DOB 05-15-1934, MRN [id number] The patient was identified using 2 identifiers. Patient Location: Home Provider Location: Office/Clinic PCP:  Melchor Spoon, MD [facility] Providers Cardiologist:  Teddie Favre, MD    Evaluation Performed:  Follow-Up Visit  Chief Complaint:  Leg swelling follow-up  History of Present Illness:    [name] is a 89 y.o. female with a PMH of HFpEF, COPD, and dementia, who is here for leg swelling follow-up.  HPI is provided by patient's daughter.  Patient is a poor historian due to history of dementia.  Last time she was seen in the office was in 2022.  Last evaluated via telephone visit by Dr. Londa Rival was on September 23, 2022.  She had been hospitalized in August 2024 after fall from wheelchair.  At that time, was currently residing at [facility].  No fractures noted.  Hypotension and electrolyte abnormalities were stabilized.  SNF offered, family wanted to bring patient home.  Overall was doing well at the time.  Was working with PT at home, denied any recurrent falls.  03/11/2023 - Our office was recently contacted by daughter noting some concerns.  Daughter is historian and says previous fluid pill was "draining her," did not want to do anything. Daughter says she stopped giving patient  diuretic, however leg swelling has gotten progressively worse.  Does also admit to blood pressure dropping with diuretic.  She was previously advised by Dr. Londa Rival to give 5 mg of midodrine  when taking the diuretic.  Daughter states SBP dropped to 70s with Lasix .  Blood pressure stable at this time.  Daughter says she is having a challenging time trying to encourage her mother to drink fluids, she has also been eating a lot of salty foods including [employer] de[employer] biscuits. Denies any chest pain, shortness of breath, palpitations, syncope, presyncope, dizziness, orthopnea, PND, acute bleeding, or claudication.  She says she is showing more fluid retention due to her heart failure and wants to know what she should do regarding her mother's medications.  04/29/2023 - History is provided by daughter, [name].  Patient is a poor historian due to history of dementia.  [name] says she is doing a little bit better since last visit.  Currently taking Lasix  40 mg daily, and has said that if there are days she does not take Lasix , leg swelling builds up more.  She is wanting to know if patient can be on Lasix  40 mg daily.  Currently taking midodrine  twice daily and tolerating this well.  She is scheduled to see her primary care provider, Dr. [name] this Monday who will be drawing lab work. Denies any chest pain, shortness of breath, palpitations, syncope, presyncope, dizziness, orthopnea, PND, acute bleeding, or claudication.    ROS:   Please see the history of present illness.    All  other systems reviewed and are negative.  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  Prior CV studies:   The following studies were reviewed today:  Echo 04/2022: 1. Left ventricular ejection fraction, by estimation, is 55 to 60%. The  left ventricle has normal function. The left ventricle has no regional  wall motion abnormalities. There is moderate asymmetric left ventricular  hypertrophy  of the basal-septal  segment. Left ventricular diastolic parameters are consistent with Grade I diastolic dysfunction (impaired relaxation).   2. Right ventricular systolic function is normal. The right ventricular  size is normal. There is normal pulmonary artery systolic pressure. The  estimated right ventricular systolic pressure is 31.5 mmHg.   3. Left atrial size was severely dilated.   4. The mitral valve is degenerative. Trivial mitral valve regurgitation.  No evidence of mitral stenosis.   5. The aortic valve is abnormal. There is mild calcification of the  aortic valve. There is mild thickening of the aortic valve. Aortic valve  regurgitation is not visualized. Aortic valve sclerosis/calcification is  present, without any evidence of aortic   stenosis.   6. The inferior vena cava is normal in size with greater than 50%  respiratory variability, suggesting right atrial pressure of 3 mmHg.  Labs/Other Tests and Data Reviewed:    EKG: EKG is not ordered today.   Objective:    Vital Signs:  BP 118/72   Pulse 84   Temp (!) 97.2 F (36.2 C)   Resp 20   Wt 106 lb 11.2 oz (48.4 kg)   SpO2 96%   BMI 20.16 kg/m    Due to nature of today's visit, a physical exam was unable to be performed.  ASSESSMENT & PLAN:    Chronic HFpEF, leg edema, medication management Stage C, unable to determine NYHA classification as patient is unable to provide history or explain her symptoms due to her dementia.  Daughter describes stable/improved leg edema.  Now taking Lasix  40 mg daily. Low sodium diet, fluid restriction <2L, and daily weights encouraged. Educated to contact our office for weight gain of 2 lbs overnight or 5 lbs in one week.  Will switch Lasix  to 40 mg daily and may take extra tablet daily as needed for leg swelling, weight gain, or increased shortness of breath. Have recommended PCP obtain necessary bloodwork at next appt and fax to our office. Daughter verbalizes understanding. No  other medication changes at this time.     Hypotension due to hypovolemia Improved BP readings. Instructed to continue midodrine  and to encourage regular fluid intake.  Instructed she may give an additional 5 mg during the day if needed for SBP less than 90.  Daughter verbalized understanding. Discussed to monitor BP at home at least 2 hours after medications and sitting for 5-10 minutes.  Care and ED precautions discussed.  No other medication changes at this time.  Dementia Patient has progressive dementia.  She is unable to make office appointments at this time.  Continue to follow-up with PCP and home health agency.  Time:   Today, I have spent 6 minutes with the patient with telehealth technology discussing the above problems.     Medication Adjustments/Labs and Tests Ordered: Current medicines are reviewed at length with the patient today.  Concerns regarding medicines are outlined above.   Tests Ordered: No orders of the defined types were placed in this encounter.   Medication Changes: Meds ordered this encounter  Medications   furosemide  (LASIX ) 40 MG tablet    Sig: Take 1 tablet (40 mg total) by mouth daily. With an extra 40 Mg tablet as needed for swelling, shortness of breath and weight gain.    Dispense:  135 tablet    Refill:  1    Dose Increase 04/29/23    Follow Up:  Virtual Visit  in 6 months   Signed, Lasalle Pointer, NP

## 2023-05-04 ENCOUNTER — Ambulatory Visit: Payer: Medicare Other | Admitting: Orthopedic Surgery

## 2023-05-04 DIAGNOSIS — M25561 Pain in right knee: Secondary | ICD-10-CM

## 2023-05-04 DIAGNOSIS — M25511 Pain in right shoulder: Secondary | ICD-10-CM | POA: Diagnosis not present

## 2023-05-04 DIAGNOSIS — G8929 Other chronic pain: Secondary | ICD-10-CM | POA: Diagnosis not present

## 2023-05-04 MED ORDER — METHYLPREDNISOLONE ACETATE 40 MG/ML IJ SUSP
40.0000 mg | Freq: Once | INTRAMUSCULAR | Status: AC
  Administered 2023-05-04: 40 mg via INTRA_ARTICULAR

## 2023-05-04 NOTE — Patient Instructions (Signed)

## 2023-05-04 NOTE — Progress Notes (Addendum)
 Orthopaedic Clinic Return  Assessment: [name] is a 89 y.o. female with the following: Right shoulder pain Right knee pain  Plan: [name] has right shoulder pain, as well as right knee pain.  Previous injections have been helpful.  She would like to proceed with injections of the right shoulder, as well as the right knee.  She is also having pain in the left shoulder, but this would represent too many injections.  If she has any further issues, she will contact the clinic.   Procedure note injection Right shoulder    Verbal consent was obtained to inject the right shoulder, subacromial space Timeout was completed to confirm the site of injection.  The skin was prepped with alcohol and ethyl chloride was sprayed at the injection site.  A 21-gauge needle was used to inject 40 mg of Depo-Medrol  and 1% lidocaine  (4 cc) into the subacromial space of the left shoulder using a posterolateral approach.  There were no complications. A sterile bandage was applied.   Procedure note injection Right knee joint   Verbal consent was obtained to inject the right knee joint  Timeout was completed to confirm the site of injection.  The skin was prepped with alcohol and ethyl chloride was sprayed at the injection site.  A 21-gauge needle was used to inject 40 mg of Depo-Medrol  and 1% lidocaine  (4 cc) into the right knee using an anterolateral approach.  There were no complications. A sterile bandage was applied.   Follow-up: Return if symptoms worsen or fail to improve.   Subjective:  Chief Complaint  Patient presents with   Injections    RIGHT shoulder RIGHT knee     History of Present Illness: [name] is a 89 y.o. female who returns to clinic for evaluation of the right knee and the right shoulder pain.  She is well-known to my clinic.  She continues to have chronic pain in both her right knee, as well as both shoulders.  She would like to have both shoulders injected, as well  as her right knee.  Right shoulder is currently hurting more than the left shoulder.  Review of Systems: No fevers or chills No numbness or tingling No chest pain No shortness of breath No bowel or bladder dysfunction No GI distress No headaches   Objective: There were no vitals taken for this visit.  Physical Exam:  Seated in a wheelchair.  Hard of hearing.  Left shoulder without deformity.  There is atrophy.  No redness.  No swelling.  She has difficulty with overhead motion.  Right shoulder without deformity.  Difficult with overhead motion.  No swelling.  No obvious deformity. Right knee with mild effusion.  Tenderness palpation medial lateral joint lines.  She is only able to achieve full extension, range of motion from 10-100 degrees.  Negative Lachman.  IMAGING: I personally ordered and reviewed the following images:  No new imaging obtained today.   Tonita Frater, MD 05/04/2023 11:45 AM

## 2023-05-28 ENCOUNTER — Other Ambulatory Visit: Payer: Self-pay

## 2023-05-28 ENCOUNTER — Emergency Department (HOSPITAL_COMMUNITY)
Admission: EM | Admit: 2023-05-28 | Discharge: 2023-05-31 | Disposition: A | Discharge status: Home / Self Care | Attending: Emergency Medicine | Admitting: Emergency Medicine

## 2023-05-28 ENCOUNTER — Emergency Department (HOSPITAL_COMMUNITY)

## 2023-05-28 ENCOUNTER — Encounter (HOSPITAL_COMMUNITY): Payer: Self-pay

## 2023-05-28 DIAGNOSIS — N390 Urinary tract infection, site not specified: Secondary | ICD-10-CM | POA: Diagnosis not present

## 2023-05-28 DIAGNOSIS — M79622 Pain in left upper arm: Secondary | ICD-10-CM | POA: Insufficient documentation

## 2023-05-28 DIAGNOSIS — R531 Weakness: Secondary | ICD-10-CM | POA: Insufficient documentation

## 2023-05-28 DIAGNOSIS — F0390 Unspecified dementia without behavioral disturbance: Secondary | ICD-10-CM | POA: Diagnosis not present

## 2023-05-28 DIAGNOSIS — M25562 Pain in left knee: Secondary | ICD-10-CM | POA: Insufficient documentation

## 2023-05-28 DIAGNOSIS — W19XXXA Unspecified fall, initial encounter: Secondary | ICD-10-CM | POA: Diagnosis not present

## 2023-05-28 DIAGNOSIS — M25561 Pain in right knee: Secondary | ICD-10-CM | POA: Insufficient documentation

## 2023-05-28 DIAGNOSIS — R062 Wheezing: Secondary | ICD-10-CM | POA: Diagnosis present

## 2023-05-28 MED ORDER — LACTATED RINGERS IV BOLUS
1000.0000 mL | Freq: Once | INTRAVENOUS | Status: AC
  Administered 2023-05-28: 1000 mL via INTRAVENOUS

## 2023-05-28 NOTE — ED Triage Notes (Addendum)
 Pt arrived from home via [facility] s/p fall yesterday now c/o bilateral knee pain and left arm pain. Pt alert and oriented x 2. Pt states " My family thinks I have an infection. Will you check my water ?"

## 2023-05-28 NOTE — ED Provider Notes (Signed)
 [place] EMERGENCY DEPARTMENT AT [facility]  Provider Note  CSN: [id number] Arrival date & time: 05/28/23 2223  History Chief Complaint  Patient presents with   [name]    [name] is a 89 y.o. female with history of dementia, lives at home with family and an aide where she typically gets around with a walker. Yesterday she got up to go to the bathroom without her walker or any assistance and had a fall. Since then she has been more confused, unable to assist with ADLs as usual, unable to get out of bed or stand even with assistance. She has not had a fever. She has been complaining of knee pain and left arm pain, some of which is chronic. They are unsure if she hit her head or not.    Home Medications Prior to Admission medications   Medication Sig Start Date End Date Taking? Authorizing Provider  acetaminophen  (TYLENOL ) 500 MG tablet Take 1 tablet (500 mg total) by mouth every 8 (eight) hours as needed. Patient taking differently: Take 500 mg by mouth every 8 (eight) hours as needed for mild pain (pain score 1-3) or moderate pain (pain score 4-6). 08/26/22   Lacey Arias, MD  albuterol  (PROAIR  HFA) [phone] [id number] Base) MCG/ACT inhaler Inhale 2 puffs into the lungs every 4 (four) hours as needed for wheezing or shortness of breath. Patient taking differently: No sig reported 06/12/21   Lacey Cairo, MD  ALPRAZolam  (XANAX ) 0.5 MG tablet Take 1 tablet (0.5 mg total) by mouth 2 (two) times daily. 07/29/22   Lacey Fillers, MD  CRANBERRY PO Take 1 tablet by mouth daily.    [provider]  diclofenac  Sodium (VOLTAREN ) 1 % GEL APPLY AS DIRECTED TO THE AFFECTED KNEE FOUR TIMES DAILY. 08/04/20   Darrin Emerald, MD  donepezil  (ARICEPT ) 10 MG tablet Take 10 mg by mouth at bedtime. 07/25/19   [provider]  fluticasone  furoate-vilanterol (BREO ELLIPTA ) 100-25 MCG/ACT AEPB Inhale 1 puff into the lungs daily.    [provider]  furosemide  (LASIX ) 40 MG  tablet Take 1 tablet (40 mg total) by mouth daily. With an extra 40 Mg tablet as needed for swelling, shortness of breath and weight gain. 04/29/23   Lacey Pointer, NP  ipratropium-albuterol  (DUONEB) 0.5-2.5 (3) MG/3ML SOLN Take 3 mLs by nebulization in the morning, at noon, in the evening, and at bedtime.    [provider]  LINZESS  145 MCG CAPS capsule Take 145 mcg by mouth daily as needed (constipation). 05/22/21   [provider]  loratadine  (CLARITIN ) 10 MG tablet Take 10 mg by mouth daily. 08/29/21   [provider]  memantine  (NAMENDA ) 10 MG tablet Take 10 mg by mouth 2 (two) times daily.    [provider]  midodrine  (PROAMATINE ) 5 MG tablet Take 1 tablet (5 mg total) by mouth 2 (two) times daily with a meal. 01/21/23   Lacey Knight, MD  Multiple Vitamins-Minerals (ONE A DAY WOMEN 50 PLUS PO) Take 1 tablet by mouth daily.    [provider]  pantoprazole  (PROTONIX ) 40 MG tablet TAKE (1) TABLET BY MOUTH TWICE A DAY WITH MEALS (BREAKFAST AND SUPPER) 05/24/19   Lacey Manila, NP  potassium chloride  (KLOR-CON ) 10 MEQ tablet Take 1 tablet (10 mEq total) by mouth daily. When taking lasix . 11/30/22   Lacey Oman, MD  simvastatin  (ZOCOR ) 10 MG tablet Take 10 mg by mouth at bedtime. 02/27/21   [provider]  traMADol  (ULTRAM ) 50 MG tablet Take 1 tablet (50 mg total) by mouth every 6 (six) hours as needed for moderate pain or severe pain. Patient taking differently: Take 50 mg by mouth every 4 (four) hours as needed. 07/29/22   Lacey Fillers, MD  traZODone (DESYREL) 50 MG tablet Take 1 tablet by mouth at bedtime. 05/02/23 05/01/24  [provider]  zolpidem  (AMBIEN ) 5 MG tablet Take 1 tablet (5 mg total) by mouth at bedtime. 07/29/22   Lacey Fillers, MD     Allergies    Codeine, Neurontin [gabapentin], Sulfonamide derivatives, Aloe, Levaquin [levofloxacin], Ms contin  [morphine ], Septra [sulfamethoxazole-trimethoprim], Sulfa antibiotics,  Trimethoprim, and Vibra -tab [doxycycline ]   Review of Systems   Review of Systems Please see HPI for pertinent positives and negatives  Physical Exam BP 134/75   Pulse 84   Temp 98.7 F (37.1 C)   Resp 18   Ht 5\' 1"  (1.549 m)   Wt 48.4 kg   SpO2 95%   BMI 20.16 kg/m   Physical Exam Vitals and nursing note reviewed.  Constitutional:      Appearance: Normal appearance.  HENT:     Head: Normocephalic and atraumatic.     Nose: Nose normal.     Mouth/Throat:     Mouth: Mucous membranes are moist.  Eyes:     Extraocular Movements: Extraocular movements intact.     Conjunctiva/sclera: Conjunctivae normal.  Cardiovascular:     Rate and Rhythm: Normal rate.  Pulmonary:     Effort: Pulmonary effort is normal.     Breath sounds: Normal breath sounds.  Abdominal:     General: Abdomen is flat.     Palpations: Abdomen is soft.     Tenderness: There is no abdominal tenderness.  Musculoskeletal:        General: No swelling or tenderness. Normal range of motion.     Cervical back: Neck supple.  Skin:    General: Skin is warm and dry.     Comments: Skin team LUE in bandage  Neurological:     General: No focal deficit present.     Mental Status: She is alert. She is disoriented.     Cranial Nerves: No cranial nerve deficit.     Sensory: No sensory deficit.     Motor: No weakness.  Psychiatric:        Mood and Affect: Mood normal.     ED Results / Procedures / Treatments   EKG None  Procedures Procedures  Medications Ordered in the ED Medications  lactated ringers  bolus 1,000 mL (has no administration in time range)    Initial Impression and Plan  Patient here for evaluation after what sounds like a mechanical fall yesterday but since then has had more difficulty with ADLs and more confused than baseline per family at bedside. Will check imaging of her areas of concern, including head and c-spine, and check labs for signs of metabolic or infectious etiology of her  confusion.   ED Course       MDM Rules/Calculators/A&P Medical Decision Making Amount and/or Complexity of Data Reviewed Labs: ordered. Radiology: ordered.     Final Clinical Impression(s) / ED Diagnoses Final diagnoses:  None    Rx / DC Orders ED Discharge Orders     None

## 2023-05-29 DIAGNOSIS — M25562 Pain in left knee: Secondary | ICD-10-CM | POA: Diagnosis not present

## 2023-05-29 LAB — URINALYSIS, W/ REFLEX TO CULTURE (INFECTION SUSPECTED)
Bacteria, UA: NONE SEEN
Bilirubin Urine: NEGATIVE
Glucose, UA: NEGATIVE mg/dL
Hgb urine dipstick: NEGATIVE
Ketones, ur: NEGATIVE mg/dL
Leukocytes,Ua: NEGATIVE
Nitrite: NEGATIVE
Protein, ur: NEGATIVE mg/dL
Specific Gravity, Urine: 1.009 (ref 1.005–1.030)
pH: 5 (ref 5.0–8.0)

## 2023-05-29 LAB — COMPREHENSIVE METABOLIC PANEL WITH GFR
ALT: 20 U/L (ref 0–44)
AST: 29 U/L (ref 15–41)
Albumin: 3.6 g/dL (ref 3.5–5.0)
Alkaline Phosphatase: 100 U/L (ref 38–126)
Anion gap: 8 (ref 5–15)
BUN: 27 mg/dL — ABNORMAL HIGH (ref 8–23)
CO2: 26 mmol/L (ref 22–32)
Calcium: 8.8 mg/dL — ABNORMAL LOW (ref 8.9–10.3)
Chloride: 97 mmol/L — ABNORMAL LOW (ref 98–111)
Creatinine, Ser: 1.19 mg/dL — ABNORMAL HIGH (ref 0.44–1.00)
GFR, Estimated: 45 mL/min — ABNORMAL LOW (ref >60)
Glucose, Bld: 103 mg/dL — ABNORMAL HIGH (ref 70–99)
Potassium: 4.7 mmol/L (ref 3.5–5.1)
Sodium: 131 mmol/L — ABNORMAL LOW (ref 135–145)
Total Bilirubin: 0.5 mg/dL (ref 0.0–1.2)
Total Protein: 7.4 g/dL (ref 6.5–8.1)

## 2023-05-29 LAB — CBC WITH DIFFERENTIAL/PLATELET
Abs Immature Granulocytes: 0.02 10*3/uL (ref 0.00–0.07)
Basophils Absolute: 0 10*3/uL (ref 0.0–0.1)
Basophils Relative: 1 %
Eosinophils Absolute: 0.1 10*3/uL (ref 0.0–0.5)
Eosinophils Relative: 2 %
HCT: 34.8 % — ABNORMAL LOW (ref 36.0–46.0)
Hemoglobin: 10.6 g/dL — ABNORMAL LOW (ref 12.0–15.0)
Immature Granulocytes: 0 %
Lymphocytes Relative: 15 %
Lymphs Abs: 1.1 10*3/uL (ref 0.7–4.0)
MCH: 28.3 pg (ref 26.0–34.0)
MCHC: 30.5 g/dL (ref 30.0–36.0)
MCV: 92.8 fL (ref 80.0–100.0)
Monocytes Absolute: 1 10*3/uL (ref 0.1–1.0)
Monocytes Relative: 13 %
Neutro Abs: 5.3 10*3/uL (ref 1.7–7.7)
Neutrophils Relative %: 69 %
Platelets: 197 10*3/uL (ref 150–400)
RBC: 3.75 MIL/uL — ABNORMAL LOW (ref 3.87–5.11)
RDW: 13.2 % (ref 11.5–15.5)
WBC: 7.6 10*3/uL (ref 4.0–10.5)
nRBC: 0 % (ref 0.0–0.2)

## 2023-05-29 MED ORDER — LINACLOTIDE 145 MCG PO CAPS: 145.0000 ug | ORAL_CAPSULE | Freq: Every day | ORAL | Status: DC | PRN

## 2023-05-29 MED ORDER — ALPRAZOLAM 0.5 MG PO TABS
0.5000 mg | ORAL_TABLET | Freq: Two times a day (BID) | ORAL | Status: DC
  Administered 2023-05-29 – 2023-05-30 (×4): 0.5 mg via ORAL
  Filled 2023-05-29 (×4): qty 1

## 2023-05-29 MED ORDER — ALBUTEROL SULFATE (2.5 MG/3ML) 0.083% IN NEBU
2.5000 mg | INHALATION_SOLUTION | RESPIRATORY_TRACT | Status: DC | PRN
  Administered 2023-05-29 (×2): 2.5 mg via RESPIRATORY_TRACT
  Filled 2023-05-29 (×3): qty 3

## 2023-05-29 MED ORDER — SIMVASTATIN 10 MG PO TABS
10.0000 mg | ORAL_TABLET | Freq: Every day | ORAL | Status: DC
Start: 2023-05-29 — End: 2023-05-31
  Administered 2023-05-29 – 2023-05-30 (×2): 10 mg via ORAL
  Filled 2023-05-29 (×2): qty 1

## 2023-05-29 MED ORDER — TRAMADOL HCL 50 MG PO TABS: 50.0000 mg | ORAL_TABLET | Freq: Four times a day (QID) | ORAL | Status: DC | PRN

## 2023-05-29 MED ORDER — TRAZODONE HCL 50 MG PO TABS
50.0000 mg | ORAL_TABLET | Freq: Every evening | ORAL | Status: DC | PRN
  Administered 2023-05-30: 50 mg via ORAL
  Filled 2023-05-29: qty 1

## 2023-05-29 MED ORDER — ACETAMINOPHEN 500 MG PO TABS
500.0000 mg | ORAL_TABLET | Freq: Three times a day (TID) | ORAL | Status: DC | PRN
  Administered 2023-05-30 (×2): 500 mg via ORAL
  Filled 2023-05-29 (×2): qty 1

## 2023-05-29 MED ORDER — ZOLPIDEM TARTRATE 5 MG PO TABS
5.0000 mg | ORAL_TABLET | Freq: Every day | ORAL | Status: DC
Start: 2023-05-29 — End: 2023-05-31
  Administered 2023-05-29 – 2023-05-30 (×2): 5 mg via ORAL
  Filled 2023-05-29 (×2): qty 1

## 2023-05-29 MED ORDER — FUROSEMIDE 40 MG PO TABS
40.0000 mg | ORAL_TABLET | Freq: Every day | ORAL | Status: DC
  Administered 2023-05-29 – 2023-05-30 (×2): 40 mg via ORAL
  Filled 2023-05-29 (×2): qty 1

## 2023-05-29 MED ORDER — PANTOPRAZOLE SODIUM 40 MG PO TBEC
40.0000 mg | DELAYED_RELEASE_TABLET | Freq: Every day | ORAL | Status: DC
Start: 2023-05-29 — End: 2023-05-31
  Administered 2023-05-29 – 2023-05-30 (×2): 40 mg via ORAL
  Filled 2023-05-29 (×2): qty 1

## 2023-05-29 MED ORDER — ALBUTEROL SULFATE HFA 108 (90 BASE) MCG/ACT IN AERS: 2.0000 | INHALATION_SPRAY | RESPIRATORY_TRACT | Status: DC | PRN

## 2023-05-29 MED ORDER — DONEPEZIL HCL 5 MG PO TABS
10.0000 mg | ORAL_TABLET | Freq: Every day | ORAL | Status: DC
  Administered 2023-05-29 – 2023-05-30 (×2): 10 mg via ORAL
  Filled 2023-05-29 (×2): qty 2

## 2023-05-29 MED ORDER — MEMANTINE HCL 10 MG PO TABS
10.0000 mg | ORAL_TABLET | Freq: Two times a day (BID) | ORAL | Status: DC
  Administered 2023-05-29 – 2023-05-30 (×4): 10 mg via ORAL
  Filled 2023-05-29 (×4): qty 1

## 2023-05-29 MED ORDER — LORATADINE 10 MG PO TABS
10.0000 mg | ORAL_TABLET | Freq: Every day | ORAL | Status: DC
  Administered 2023-05-29 – 2023-05-30 (×2): 10 mg via ORAL
  Filled 2023-05-29 (×2): qty 1

## 2023-05-29 MED ORDER — FLUTICASONE FUROATE-VILANTEROL 100-25 MCG/ACT IN AEPB
1.0000 | INHALATION_SPRAY | Freq: Every day | RESPIRATORY_TRACT | Status: DC
  Administered 2023-05-30: 1 via RESPIRATORY_TRACT
  Filled 2023-05-29: qty 28

## 2023-05-29 MED ORDER — MIDODRINE HCL 5 MG PO TABS
5.0000 mg | ORAL_TABLET | Freq: Two times a day (BID) | ORAL | Status: DC
  Administered 2023-05-30 (×2): 5 mg via ORAL
  Filled 2023-05-29 (×2): qty 1

## 2023-05-29 NOTE — ED Notes (Signed)
 Placed pt on 2lpm via [place] to keep spo2 90 or greater

## 2023-05-29 NOTE — Care Management (Signed)
 Transition of Care [facility]) - Emergency Department Mini Assessment   Patient Details  Name: [name] MRN: [id number] Date of Birth: 1934-08-08  Transition of Care [facility]) CM/SW Contact:    Ronni Colace, RN Phone Number: 05/29/2023, 5:36 PM   Clinical Narrative:  Patient had consult earlier for [facility], unfortunately the patient has straight medicare and needs a 3 day impatient stay to qualify for SNF. PT consult placed,, requires 2 assists, only walked 20 feet.  Willl be seen by CSW in the Am ED Mini Assessment: What brought you to the Emergency Department? : weakness           Interventions which prevented an admission or readmission:  (PT consult boarding until AM poss SNF)    Patient Contact and Communications        ,                 Admission diagnosis:  fall Patient Active Problem List   Diagnosis Date Noted   AKI (acute kidney injury) (HCC) 11/28/2022   Hyperkalemia 11/28/2022   Hypotension 08/28/2022   Dehydration 08/28/2022   Hypokalemia 08/28/2022   Hip pain 08/28/2022   Lactic acidosis 08/28/2022   Closed fracture of right distal radius 07/28/2022   Pressure injury of skin 07/26/2022   Closed L1 vertebral fracture (HCC) 07/26/2022   Fall 07/25/2022   Compression fracture of L1,L2 vertebra (HCC) 07/25/2022   Influenza A 04/28/2022   Hypoxia 04/23/2022   Anemia, unspecified 11/23/2021   Long term (current) use of inhaled steroids 11/23/2021   Unspecified dementia, unspecified severity, with mood disturbance (HCC) 11/23/2021   Fracture of femoral neck, left, closed (HCC) 09/24/2021   Acute respiratory failure with hypoxia (HCC) 06/10/2021   Tobacco use disorder 06/10/2021   Generalized weakness 06/10/2021   Bilateral conjunctivitis 06/10/2021   Chronic diastolic CHF (congestive heart failure) (HCC) 05/14/2021   Depression 05/14/2021   Hyperlipidemia    COPD exacerbation (HCC) 05/13/2021   Long term (current) use of opiate analgesic  01/04/2021   Chronic pain 04/23/2019   General unsteadiness 04/23/2019   Polyarthropathy 04/23/2019   Pseudobulbar affect 04/23/2019   Tremor 04/23/2019   Chronic obstructive pulmonary disease, unspecified (HCC)    Gastro-esophageal reflux disease without esophagitis    Hyperlipidemia, unspecified    Pulmonary fibrosis, unspecified (HCC)    Acute on chronic diastolic (congestive) heart failure (HCC)    Age-related osteoporosis without current pathological fracture    Anxiety disorder, unspecified    Chest pain, unspecified    Chronic diastolic (congestive) heart failure (HCC)    Collapsed vertebra, not elsewhere classified, site unspecified, subsequent encounter for fracture with routine healing    Edema, unspecified    Lumbago with sciatica    Overactive bladder    Pain in unspecified hand    Pressure ulcer of unspecified site, unspecified stage    Unspecified protein-calorie malnutrition (HCC)    Acute diastolic heart failure (HCC) 09/30/2018   Dyspnea 09/29/2018   Pneumonia 06/30/2018   Acute CHF (congestive heart failure) (HCC) 06/29/2018   Vascular dementia without behavioral disturbance (HCC) 06/26/2018   CAP (community acquired pneumonia) 06/23/2018   Bilateral pleural effusion 06/23/2018   Leukocytosis 06/20/2018   Chronic anemia 06/20/2018   Dementia (HCC) 06/19/2018   Bilateral lower extremity edema 06/18/2018   Protein-calorie malnutrition, severe (HCC) 06/18/2018   S/P internal fixation right hip fracture cannulated screw placement 04/06/18 04/27/2018   UTI (urinary tract infection) 04/11/2018   Altered mental status 04/10/2018  Anxiety 04/10/2018   Hyponatremia 04/10/2018   UTI (urinary tract infection) due to urinary indwelling catheter (HCC) 04/10/2018   Chronic constipation 04/09/2018   Urinary retention 04/09/2018   Closed right hip fracture (HCC) 04/05/2018   GERD (gastroesophageal reflux disease) 04/05/2018   COPD with acute exacerbation (HCC) 04/05/2018    Hiatal hernia    Dyspepsia 04/04/2014   Hematochezia 04/30/2011   RUQ pain 03/25/2011   Adenomatous polyps 03/25/2011   ABDOMINAL PAIN, LEFT LOWER QUADRANT 10/16/2009   HYPERLIPIDEMIA 10/09/2009   BRONCHITIS 10/09/2009   Osteoporosis 10/09/2009   Personal history of pneumonia (recurrent) 01/05/1999   Mixed hyperlipidemia 01/05/1999   PCP:  Melchor Spoon, MD Pharmacy:   [facility] - [place], [place] - [address] [place] [address] Phone: [phone] Fax: [phone]  [facility] [id number] - [place], [place] - [address] AT [facility] [address] [place] [address] Phone: [phone] Fax: [phone]

## 2023-05-29 NOTE — ED Notes (Signed)
 Attempted to feed pt lunch tray, pt refuses chicken, potatoes, vegetables and spits out pears

## 2023-05-29 NOTE — ED Notes (Signed)
 Ambulation trial complete. Pt requires a 2 assist when getting out of bed and a 1 assist with a walker when walking around. Pt was able to walk with a total of 20 ft.

## 2023-05-29 NOTE — ED Notes (Signed)
 Update given to daughter who states she will be here soon to visit with pt and is considering taking pt home

## 2023-05-30 ENCOUNTER — Emergency Department (HOSPITAL_COMMUNITY)

## 2023-05-30 DIAGNOSIS — N390 Urinary tract infection, site not specified: Secondary | ICD-10-CM | POA: Diagnosis not present

## 2023-05-30 DIAGNOSIS — W19XXXA Unspecified fall, initial encounter: Secondary | ICD-10-CM

## 2023-05-30 DIAGNOSIS — M25562 Pain in left knee: Secondary | ICD-10-CM | POA: Diagnosis not present

## 2023-05-30 DIAGNOSIS — R531 Weakness: Secondary | ICD-10-CM | POA: Diagnosis not present

## 2023-05-30 LAB — CBC WITH DIFFERENTIAL/PLATELET
Abs Immature Granulocytes: 0.02 10*3/uL (ref 0.00–0.07)
Basophils Absolute: 0.1 10*3/uL (ref 0.0–0.1)
Basophils Relative: 1 %
Eosinophils Absolute: 0.1 10*3/uL (ref 0.0–0.5)
Eosinophils Relative: 1 %
HCT: 35.4 % — ABNORMAL LOW (ref 36.0–46.0)
Hemoglobin: 11.5 g/dL — ABNORMAL LOW (ref 12.0–15.0)
Immature Granulocytes: 0 %
Lymphocytes Relative: 13 %
Lymphs Abs: 1.1 10*3/uL (ref 0.7–4.0)
MCH: 29.6 pg (ref 26.0–34.0)
MCHC: 32.5 g/dL (ref 30.0–36.0)
MCV: 91.2 fL (ref 80.0–100.0)
Monocytes Absolute: 0.8 10*3/uL (ref 0.1–1.0)
Monocytes Relative: 10 %
Neutro Abs: 6.6 10*3/uL (ref 1.7–7.7)
Neutrophils Relative %: 75 %
Platelets: 294 10*3/uL (ref 150–400)
RBC: 3.88 MIL/uL (ref 3.87–5.11)
RDW: 13.2 % (ref 11.5–15.5)
WBC: 8.6 10*3/uL (ref 4.0–10.5)
nRBC: 0 % (ref 0.0–0.2)

## 2023-05-30 LAB — COMPREHENSIVE METABOLIC PANEL WITH GFR
ALT: 22 U/L (ref 0–44)
AST: 32 U/L (ref 15–41)
Albumin: 3.5 g/dL (ref 3.5–5.0)
Alkaline Phosphatase: 90 U/L (ref 38–126)
Anion gap: 10 (ref 5–15)
BUN: 23 mg/dL (ref 8–23)
CO2: 27 mmol/L (ref 22–32)
Calcium: 9 mg/dL (ref 8.9–10.3)
Chloride: 96 mmol/L — ABNORMAL LOW (ref 98–111)
Creatinine, Ser: 1.27 mg/dL — ABNORMAL HIGH (ref 0.44–1.00)
GFR, Estimated: 41 mL/min — ABNORMAL LOW (ref >60)
Glucose, Bld: 168 mg/dL — ABNORMAL HIGH (ref 70–99)
Potassium: 4 mmol/L (ref 3.5–5.1)
Sodium: 133 mmol/L — ABNORMAL LOW (ref 135–145)
Total Bilirubin: 0.6 mg/dL (ref 0.0–1.2)
Total Protein: 7.4 g/dL (ref 6.5–8.1)

## 2023-05-30 LAB — URINALYSIS, W/ REFLEX TO CULTURE (INFECTION SUSPECTED)
Bilirubin Urine: NEGATIVE
Glucose, UA: NEGATIVE mg/dL
Hgb urine dipstick: NEGATIVE
Ketones, ur: NEGATIVE mg/dL
Nitrite: POSITIVE — AB
Protein, ur: NEGATIVE mg/dL
Specific Gravity, Urine: 1.008 (ref 1.005–1.030)
pH: 6 (ref 5.0–8.0)

## 2023-05-30 LAB — C DIFFICILE QUICK SCREEN W PCR REFLEX
C Diff antigen: NEGATIVE
C Diff interpretation: NOT DETECTED
C Diff toxin: NEGATIVE

## 2023-05-30 MED ORDER — SODIUM CHLORIDE 0.9 % IV BOLUS
500.0000 mL | Freq: Once | INTRAVENOUS | Status: AC
  Administered 2023-05-30: 500 mL via INTRAVENOUS

## 2023-05-30 MED ORDER — CEPHALEXIN 500 MG PO CAPS: 500.0000 mg | ORAL_CAPSULE | Freq: Two times a day (BID) | ORAL | Status: DC

## 2023-05-30 MED ORDER — ONDANSETRON 4 MG PO TBDP
4.0000 mg | ORAL_TABLET | Freq: Once | ORAL | Status: AC
  Administered 2023-05-30: 4 mg via ORAL
  Filled 2023-05-30: qty 1

## 2023-05-30 MED ORDER — SODIUM CHLORIDE 0.9 % IV SOLN
1.0000 g | Freq: Once | INTRAVENOUS | Status: AC
  Administered 2023-05-30: 1 g via INTRAVENOUS
  Filled 2023-05-30: qty 10

## 2023-05-30 MED ORDER — IPRATROPIUM-ALBUTEROL 0.5-2.5 (3) MG/3ML IN SOLN
3.0000 mL | RESPIRATORY_TRACT | Status: DC | PRN
  Administered 2023-05-31: 3 mL via RESPIRATORY_TRACT
  Filled 2023-05-30: qty 3

## 2023-05-30 NOTE — ED Notes (Signed)
 Patient assisted to recliner with 2 person assist.

## 2023-05-30 NOTE — Discharge Planning (Signed)
 RNCM spoke with daughter, [name] via telephone regarding discharge plan for her mom.  [name] states that "it is not safe having momma at home.  It took 3 people to get her him bed yesterday and I am home alone with her.  I can't do it by myself."  RNCM explained the regulations surrounding being eligible for SNF/memory care placement from ED.  Pt states she will discuss with EDP and team when she arrives there today as she is driving at this time.  Edison Nicholson J. Rachel Budds, BSN, RN, NCM  Transitions of Care  Nurse Case Manager  [facility] Emergency Departments  Operative Services  [phone]

## 2023-05-30 NOTE — Progress Notes (Signed)
 CSW spoke with pt's daughter to inform of barrier to SNF from ED. Daughter informed she has cancer and is unable to "carry mama around the house." Daughter stated they are unable to private pay for SNF. She reports pt had Home Health through [facility] and states they would come once a week for 15 minutes. She also states the pt has an Aide through [employer] who "sat on the couch on her phone and that's how mama fell." Daughter reports they are working on finding the pt another aide but states "it'll be another 2 weeks because nobody wants to work." Per the daughter, her son (pt's grandson) lives with them and works 2nd shift.   CSW notified care team of barriers (EDP, PT, RNCM).

## 2023-05-30 NOTE — ED Provider Notes (Signed)
 Emergency Medicine Observation Re-evaluation Note  [name] is a 89 y.o. female, seen on rounds today.  Pt initially presented to the ED for complaints of Fall Currently, the patient is asleep.  Physical Exam  BP 116/82   Pulse 79   Temp 98.7 F (37.1 C)   Resp 18   Ht 5\' 1"  (1.549 m)   Wt 48.4 kg   SpO2 92%   BMI 20.16 kg/m  Physical Exam General: asleep Cardiac: asleep Lungs: asleep Psych: asleep  ED Course / MDM  EKG:   I have reviewed the labs performed to date as well as medications administered while in observation.  Recent changes in the last 24 hours include home medications being ordered.  Plan  Current plan is for TOC placement.    Jerilynn Montenegro, MD 05/30/23 [phone]

## 2023-05-30 NOTE — Plan of Care (Signed)
  Problem: Acute Rehab PT Goals(only PT should resolve) Goal: Pt Will Go Supine/Side To Sit Outcome: Progressing Flowsheets (Taken 05/30/2023 1330) Pt will go Supine/Side to Sit:  with contact guard assist  with minimal assist Goal: Patient Will Transfer Sit To/From Stand Outcome: Progressing Flowsheets (Taken 05/30/2023 1330) Patient will transfer sit to/from stand:  with contact guard assist  with minimal assist Goal: Pt Will Transfer Bed To Chair/Chair To Bed Outcome: Progressing Flowsheets (Taken 05/30/2023 1330) Pt will Transfer Bed to Chair/Chair to Bed:  with contact guard assist  with min assist Goal: Pt Will Ambulate Outcome: Progressing Flowsheets (Taken 05/30/2023 1330) Pt will Ambulate:  25 feet  with minimal assist  with rolling walker   1:30 PM, 05/30/23 Walton Guppy, MPT Physical Therapist with [facility] [phone] office [phone] mobile phone

## 2023-05-30 NOTE — Discharge Planning (Signed)
 RNCM spoke with [facility] for back-up planning for home health services (RN and PT).  [facility] accepted referral should pt discharge home with home health.

## 2023-05-30 NOTE — Consult Note (Signed)
 Initial Consultation Note   Patient: [name]:[id number] DOB: 28-Nov-1934 PCP: Melchor Spoon, MD DOA: 05/28/2023 DOS: the patient was seen and examined on 05/30/2023  Primary service: System, Provider Not In  Referring physician: Dr. Aldean Amass  Reason for consult: Weakness and UTI  Assessment and Plan: 1-UTI - No fever, normal WBCs, no nausea, no vomiting, capable of voiding on her own. - Cultures taken and pending - Empiric antibiotics initiated. - Patient stable to take antibiotics by mouth and narrow appropriately based on sensitivity. -Not meeting inpatient criteria.  2-generalized weakness/underlying dementia - Overall condition decompensated in the setting of sundowning, change in environment, presumably presence of UTI and use of analgesics. - No fractures or abnormalities on CT head/cervical spine appreciated. - Continue supportive care - Physical therapy recommending skilled nursing facility placement for rehabilitation and conditioning - Minimize sedative agents; continue Namenda  - Continue constant reorientation - Maintain adequate hydration and continue treatment with antibiotics.  3-orthostatic hypotension - Continue treatment with midodrine  - Vital signs stable.  4-dementia with behavioral disturbances - Constant reorientation and continuation of home Namenda /Aricept , trazodone and as needed alprazolam  recommended.  5-chronic constipation - Continue the use of Linzess .  6-history of asthma/COPD - No acute exacerbation appreciated - Chest x-ray demonstrated no acute infiltrates or cardiopulmonary process. - Continue home bronchodilator management.  7-GERD - Continue PPI  8-chronic kidney disease stage IIIb - Appears to be stable and at baseline based on chart review - Maintain adequate oral hydration - Continue to follow renal function trend intermittent - Minimize/avoid the use of nephrotoxic agents.   TRH will sign off at present, please  call us  again when needed.  HPI: [name] is a 89 y.o. female with past medical history of dementia, COPD/asthma, chronic kidney disease stage IIIb, orthostatic hypotension, hyperlipidemia and GERD; who presented to the hospital secondary to mechanical fall.  Images demonstrating no acute fracture or injury.  Further workup suggesting the presence of UTI; patient is nonseptic and demonstrating capacity to take medications by mouth.  No fever, normal WBCs, no sick contacts, lack of proper social support currently appreciated.  Patient will benefit of short-term rehabilitation prior to returning home.  Patient has not met acute inpatient criteria at the moment.  Social worker involved to try to place patient from the ED.  Review of Systems: As mentioned in the history of present illness. All other systems reviewed and are negative. Past Medical History:  Diagnosis Date   Anxiety    Chronic diastolic (congestive) heart failure (HCC)    Chronic obstructive pulmonary disease, unspecified (HCC)    COPD (chronic obstructive pulmonary disease) (HCC)    GERD (gastroesophageal reflux disease)    Hyperlipidemia    IBS (irritable bowel syndrome)    Lumbago with sciatica    Osteoporosis    Overactive bladder    Pneumonia    Pulmonary fibrosis (HCC)    Past Surgical History:  Procedure Laterality Date   COLONOSCOPY  10/04/2009   sigmoid diverticula, multiple tubulovillous adneomas, needs surveillance Oct 2013   ESOPHAGOGASTRODUODENOSCOPY  04/16/2011   Dr. Riley Cheadle: Noncritical Schatzkis ring( not manipulated because no dysphagia). Normal esophagus otherwise  large hiatal hernia. Gastric polyp-status post biopsy. Gastric erosions-staus post biopsy. Abnormal bulb-status post biopsy. benign small bowel, stomach biopsy with ulcerated gastric antral mucosa with foveolar hyperplasia and surface erosion, polyp with inflamed gastric antral mucosa    ESOPHAGOGASTRODUODENOSCOPY N/A 04/29/2014   Dr. Riley Cheadle:  Noncritical Schatzki's ring large hiatal hernia. Retained gastric contents.GES  with slight delay   femur fracture Left 2023   HIP ARTHROPLASTY Left 09/24/2021   Procedure: ARTHROPLASTY BIPOLAR HIP (HEMIARTHROPLASTY);  Surgeon: Tonita Frater, MD;  Location: [facility];  Service: Orthopedics;  Laterality: Left;   HIP PINNING,CANNULATED Right 04/06/2018   Procedure: CANNULATED HIP PINNING;  Surgeon: Darrin Emerald, MD;  Location: [facility];  Service: Orthopedics;  Laterality: Right;   Social History:  reports that she has quit smoking. Her smoking use included cigarettes. She has been exposed to tobacco smoke. Her smokeless tobacco use includes snuff. She reports that she does not drink alcohol and does not use drugs.  Allergies  Allergen Reactions   Codeine Anaphylaxis, Shortness Of Breath and Rash   Neurontin [Gabapentin] Anaphylaxis   Sulfonamide Derivatives Hives and Shortness Of Breath   Aloe Other (See Comments)    Unknown reaction   Levaquin [Levofloxacin] Other (See Comments)    Pt admitted to hospital with lung problems due to this medication.   Ms Contin  [Morphine ] Other (See Comments)    Patient's daughter reports hypoxia requiring O2 with morphine  and similar opiates.  But does okay with tramadol .   Septra [Sulfamethoxazole-Trimethoprim] Hives   Sulfa Antibiotics Hives   Trimethoprim Hives   Vibra -Tab [Doxycycline ] Hives    Family History  Problem Relation Age of Onset   Stroke Mother    Diabetes Mother    Heart disease Father    Colon cancer Neg Hx     Prior to Admission medications   Medication Sig Start Date End Date Taking? Authorizing Provider  acetaminophen  (TYLENOL ) 500 MG tablet Take 1 tablet (500 mg total) by mouth every 8 (eight) hours as needed. Patient taking differently: Take 500 mg by mouth every 8 (eight) hours as needed for mild pain (pain score 1-3) or moderate pain (pain score 4-6). 08/26/22  Yes Mozell Arias, MD  albuterol  (PROAIR  HFA) 108 (90  Base) MCG/ACT inhaler Inhale 2 puffs into the lungs every 4 (four) hours as needed for wheezing or shortness of breath. Patient taking differently: No sig reported 06/12/21  Yes Johnson, Clanford L, MD  ALPRAZolam  (XANAX ) 0.5 MG tablet Take 1 tablet (0.5 mg total) by mouth 2 (two) times daily. 07/29/22  Yes Tat, Myrtie Atkinson, MD  CRANBERRY PO Take 1 tablet by mouth daily.   Yes [provider]  diclofenac  Sodium (VOLTAREN ) 1 % GEL APPLY AS DIRECTED TO THE AFFECTED KNEE FOUR TIMES DAILY. 08/04/20  Yes Darrin Emerald, MD  donepezil  (ARICEPT ) 10 MG tablet Take 10 mg by mouth at bedtime. 07/25/19  Yes [provider]  fluticasone  furoate-vilanterol (BREO ELLIPTA ) 100-25 MCG/ACT AEPB Inhale 1 puff into the lungs daily.   Yes [provider]  furosemide  (LASIX ) 40 MG tablet Take 1 tablet (40 mg total) by mouth daily. With an extra 40 Mg tablet as needed for swelling, shortness of breath and weight gain. 04/29/23  Yes Lasalle Pointer, NP  ipratropium-albuterol  (DUONEB) 0.5-2.5 (3) MG/3ML SOLN Take 3 mLs by nebulization in the morning, at noon, in the evening, and at bedtime.   Yes [provider]  LINZESS  145 MCG CAPS capsule Take 145 mcg by mouth daily as needed (constipation). 05/22/21  Yes [provider]  loratadine  (CLARITIN ) 10 MG tablet Take 10 mg by mouth daily. 08/29/21  Yes [provider]  memantine  (NAMENDA ) 10 MG tablet Take 10 mg by mouth 2 (two) times daily.   Yes [provider]  midodrine  (PROAMATINE ) 5 MG tablet Take 1 tablet (5 mg  total) by mouth 2 (two) times daily with a meal. 01/21/23  Yes Gerard Knight, MD  Multiple Vitamins-Minerals (ONE A DAY WOMEN 50 PLUS PO) Take 1 tablet by mouth daily.   Yes [provider]  pantoprazole  (PROTONIX ) 40 MG tablet TAKE (1) TABLET BY MOUTH TWICE A DAY WITH MEALS (BREAKFAST AND SUPPER) 05/24/19  Yes Iola Manila, NP  potassium chloride  (KLOR-CON ) 10 MEQ tablet Take 1 tablet (10 mEq total)  by mouth daily. When taking lasix . 11/30/22  Yes Justina Oman, MD  simvastatin  (ZOCOR ) 10 MG tablet Take 10 mg by mouth at bedtime. 02/27/21  Yes [provider]  traMADol  (ULTRAM ) 50 MG tablet Take 1 tablet (50 mg total) by mouth every 6 (six) hours as needed for moderate pain or severe pain. Patient taking differently: Take 50 mg by mouth every 4 (four) hours. 07/29/22  Yes Tat, Myrtie Atkinson, MD  traZODone (DESYREL) 50 MG tablet Take 1 tablet by mouth at bedtime as needed for sleep. 05/02/23 05/01/24 Yes [provider]  zolpidem  (AMBIEN ) 5 MG tablet Take 1 tablet (5 mg total) by mouth at bedtime. 07/29/22  Bolivar Bushman, MD    Physical Exam: Vitals:   05/30/23 0454 05/30/23 0330 05/30/23 0933 05/30/23 0934  BP:      Pulse: 81 79    Resp:      Temp:   97.9 F (36.6 C)   TempSrc:   Oral   SpO2: 94% 92%  94%  Weight:      Height:       Data Reviewed:  CBC: WBCs 8.6, hemoglobin 11.5 and platelet count 2 94K Comprehensive metabolic panel: Sodium 133, potassium 4.0, chloride 96, bicarb 27, BUN 23, creatinine 1.2, normal LFTs and GFR 41. Urinalysis: Cloudy appearance, positive nitrite and positive leukocyte esterase with many bacteria. Urine culture: Pending  Family Communication: None  Primary team communication: Dr. Aldean Amass. Thank you very much for involving us  in the care of your patient.  Author: Justina Oman, MD 05/30/2023 3:37 PM  For on call review [email or web address].

## 2023-05-30 NOTE — ED Notes (Signed)
 Daughter Thurlow Florence, legal guardian, will pick pt up tomorrow morning at 0800. [name] declined SNF placement and needs additional help caring for pt at home due to acute weakness. [name]'s daughter will be available to assist c/pt at home starting tomorrow.  Pt is approved for a personal aide for 40 hrs/wk through [employer] ([employer]). According to [facility], the aide provided by [employer] has been fired due to inattentiveness (did not change incontinence briefs and did not walk behind pt knowing she was at risk for a fall). [name] states that she complained of aide's inattentiveness to [employer] x3 weeks and nothing was done until pt fell Friday (5/23). [employer] is working to get another aide in place. [name]'s daughter has agreed to stay c/pt until another [employer] aide is available.   Pt is active c/[facility]. Pt lives in grandson Saline Pullium's home and lives c/Frank who works second shift, daughter [name], and son-in-law Ruth [name]. [name] states that she is currently weak herself due to  cancer treatments but c/assistance of her daughter, Storm Ellen, the family will be able to safely care for pt at home.

## 2023-05-30 NOTE — ED Notes (Signed)
 This nt was attempting to get pt up to ambulate, pt used the bathroom on herself. Pt stood with assistance to get cleaned up and was unable to maintain standing. Pt was having trouble staying standing for a period of time. Pt continuing to ask when she could sit back down. Nurse made aware

## 2023-05-30 NOTE — ED Notes (Addendum)
 Daughter/LG [name] called to ask about Physical Therapy orders after discharge, Nurse Rod told LG that d/c paperwork and orders haven't been placed as far as I could see. LG proceeded to ask if pt had a breathing treatment today, Pt had not had one today because she hasn't complained of SOB, Pt currently  Satting @ 95% on RA and is not in distress.

## 2023-05-30 NOTE — ED Provider Notes (Signed)
 12:47 PM I had an extensive conversation with the family.  They are concerned because the patient is not meeting any inpatient criteria and they do not feel like they can handle it at home or pay for a rehabilitation center.  We had a long discussion about this, they are concerned that something is being missed.  We will try to ambulate the patient with a walker which seems to be her baseline and we will reevaluate some blood work and a urine as they state that she often acts this way when she has UTI.  Her vital signs are currently normal.  2:30 PM Patient's UA is positive for bacteria and nitrites.  She was so weak that she cannot stand on her own according to the nurse tech that tried to help her stand up.  She is also having soft but not really diarrheal bowel movements.  Given this, combined with patient being different than her baseline and not being able to take care of herself, I will give her a dose of Rocephin  and some gentle fluids and I think she will need admission.  Will consult the hospitalist.  2:55 PM I discussed with Dr. Michaelene Admire. He does not feel she needs to be admitted. He thinks she might be able to be placed because of her medicaid instead of just medicare. Will discuss with social work. For now, will treat with a regimen for her UTI.   Jerilynn Montenegro, MD 05/30/23 [phone]

## 2023-05-30 NOTE — Evaluation (Signed)
 Physical Therapy Evaluation Patient Details Name: [name] MRN: [id number] DOB: [birth year] Today's Date: 05/30/2023  History of Present Illness  [name] is a 89 y.o. female with history of dementia, lives at home with family and an aide where she typically gets around with a walker. Yesterday she got up to go to the bathroom without her walker or any assistance and had a fall. Since then she has been more confused, unable to assist with ADLs as usual, unable to get out of bed or stand even with assistance. She has not had a fever. She has been complaining of knee pain and left arm pain, some of which is chronic. They are unsure if she hit her head or not.   Clinical Impression  Patient demonstrates slow labored movement for sitting up at bedside, very unsteady on feet and limited to a few side steps before having to sit due to c/o fatigue, weakness. Patient tolerated sitting up in chair after therapy. Patient will benefit from continued skilled physical therapy in hospital and recommended venue below to increase strength, balance, endurance for safe ADLs and gait.           If plan is discharge home, recommend the following: A lot of help with walking and/or transfers;A little help with bathing/dressing/bathroom;Assistance with cooking/housework;Help with stairs or ramp for entrance   Can travel by private vehicle   Yes    Equipment Recommendations None recommended by PT  Recommendations for Other Services       Functional Status Assessment Patient has had a recent decline in their functional status and demonstrates the ability to make significant improvements in function in a reasonable and predictable amount of time.     Precautions / Restrictions Precautions Precautions: Fall Restrictions Weight Bearing Restrictions Per Provider Order: No      Mobility  Bed Mobility Overal bed mobility: Needs Assistance Bed Mobility: Supine to Sit     Supine to sit: Min assist,  Mod assist     General bed mobility comments: increased time, labored movement    Transfers Overall transfer level: Needs assistance Equipment used: Rolling walker (2 wheels) Transfers: Sit to/from Stand, Bed to chair/wheelchair/BSC Sit to Stand: Min assist, Mod assist   Step pivot transfers: Min assist, Mod assist            Ambulation/Gait Ambulation/Gait assistance: Mod assist Gait Distance (Feet): 4 Feet Assistive device: Rolling walker (2 wheels) Gait Pattern/deviations: Decreased step length - right, Decreased step length - left, Decreased stride length, Trunk flexed Gait velocity: slow     General Gait Details: limited to a few slow labored side steps before requesting to sit due to fatigue  Stairs            Wheelchair Mobility     Tilt Bed    Modified Rankin (Stroke Patients Only)       Balance Overall balance assessment: Needs assistance Sitting-balance support: Feet supported, No upper extremity supported Sitting balance-Leahy Scale: Fair Sitting balance - Comments: seated at EOB   Standing balance support: Reliant on assistive device for balance, During functional activity, Bilateral upper extremity supported Standing balance-Leahy Scale: Poor Standing balance comment: fair/poor using RW                             Pertinent Vitals/Pain Pain Assessment Pain Assessment: No/denies pain    Home Living Family/patient expects to be discharged to:: Private residence Living Arrangements: Children  Available Help at Discharge: Personal care attendant;Family;Available 24 hours/day Type of Home: House Home Access: Ramped entrance       Home Layout: One level Home Equipment: Shower seat;Hand held shower head;Grab bars - tub/shower;Grab bars - toilet;Rolling Walker (2 wheels);Hospital bed;Wheelchair - manual;BSC/3in1 Additional Comments: Pt reported no change in living history.    Prior Function Prior Level of Function : Needs  assist       Physical Assist : Mobility (physical) Mobility (physical): Bed mobility;Transfers;Gait;Stairs   Mobility Comments: household ambulation using RW ADLs Comments: Assisted by family     Extremity/Trunk Assessment   Upper Extremity Assessment Upper Extremity Assessment: Generalized weakness    Lower Extremity Assessment Lower Extremity Assessment: Generalized weakness    Cervical / Trunk Assessment Cervical / Trunk Assessment: Kyphotic  Communication   Communication Communication: No apparent difficulties    Cognition Arousal: Alert Behavior During Therapy: WFL for tasks assessed/performed   PT - Cognitive impairments: History of cognitive impairments, No apparent impairments                         Following commands: Intact       Cueing Cueing Techniques: Verbal cues, Tactile cues     General Comments      Exercises     Assessment/Plan    PT Assessment Patient needs continued PT services  PT Problem List Decreased strength;Decreased activity tolerance;Decreased balance;Decreased mobility       PT Treatment Interventions DME instruction;Gait training;Stair training;Functional mobility training;Therapeutic activities;Therapeutic exercise;Balance training;Patient/family education    PT Goals (Current goals can be found in the Care Plan section)  Acute Rehab PT Goals Patient Stated Goal: return home after rehab PT Goal Formulation: With patient Time For Goal Achievement: 06/13/23    Frequency Min 2X/week     Co-evaluation               AM-PAC PT "6 Clicks" Mobility  Outcome Measure Help needed turning from your back to your side while in a flat bed without using bedrails?: A Little Help needed moving from lying on your back to sitting on the side of a flat bed without using bedrails?: A Lot Help needed moving to and from a bed to a chair (including a wheelchair)?: A Lot Help needed standing up from a chair using your arms  (e.g., wheelchair or bedside chair)?: A Little Help needed to walk in hospital room?: A Lot Help needed climbing 3-5 steps with a railing? : A Lot 6 Click Score: 14    End of Session   Activity Tolerance: Patient tolerated treatment well;Patient limited by fatigue Patient left: in chair;with call bell/phone within reach Nurse Communication: Mobility status PT Visit Diagnosis: Unsteadiness on feet (R26.81);Other abnormalities of gait and mobility (R26.89);Muscle weakness (generalized) (M62.81)    Time: 1610-9604 PT Time Calculation (min) (ACUTE ONLY): 27 min   Charges:   PT Evaluation $PT Eval Moderate Complexity: 1 Mod PT Treatments $Therapeutic Activity: 23-37 mins PT General Charges $$ ACUTE PT VISIT: 1 Visit         1:28 PM, 05/30/23 Walton Guppy, MPT Physical Therapist with [facility] 336 [phone] office [phone] mobile phone

## 2023-05-31 DIAGNOSIS — M25562 Pain in left knee: Secondary | ICD-10-CM | POA: Diagnosis not present

## 2023-05-31 MED ORDER — CEPHALEXIN 500 MG PO CAPS: 500.0000 mg | ORAL_CAPSULE | Freq: Two times a day (BID) | ORAL | 0 refills | Status: DC

## 2023-05-31 NOTE — TOC Progression Note (Signed)
 Transition of Care [facility]) - Progression Note    Patient Details  Name: [name] MRN: [id number] Date of Birth: 10/14/1934  [facility] CM/SW Contact  Ander Katos, [place] Phone Number: 05/31/2023, 7:53 AM  Clinical Narrative:  LCSW requested MD put in home health orders. Artavia with [facility] notified pt returning home today.           Expected Discharge Plan and Services                                               Social Determinants of Health (SDOH) Interventions SDOH Screenings   Food Insecurity: No Food Insecurity (05/02/2023)   Received from [facility]  Housing: Low Risk  (05/02/2023)   Received from [facility]  Transportation Needs: No Transportation Needs (05/02/2023)   Received from [facility]  Utilities: Not At Risk (05/02/2023)   Received from [facility]  Financial Resource Strain: Medium Risk (05/02/2023)   Received from [facility]  Tobacco Use: High Risk (05/28/2023)    Readmission Risk Interventions    11/29/2022    1:31 PM 07/26/2022   12:28 PM 09/30/2021    9:22 AM  Readmission Risk Prevention Plan  Transportation Screening Complete Complete Complete  PCP or Specialist Appt within 5-7 Days  Not Complete   Home Care Screening  Complete   Medication Review (RN CM)  Complete   Medication Review (RN Care Manager) Complete  Complete  HRI or Home Care Consult Complete  Complete  SW Recovery Care/Counseling Consult Complete  Complete  Palliative Care Screening Not Applicable  Not Applicable  Skilled Nursing Facility Not Applicable  Complete

## 2023-05-31 NOTE — ED Notes (Signed)
 Call rcvd from daughter requesting pt be given a breathing treatment and states she is otw to pick up the pt, daughter states "if I get her home and she can't walk, I'm going to call the ambulance to bring her right back up there"; this RN advised pt is being discharge as there is no medical reason to admit; recommended speaking with DSS for resources

## 2023-05-31 NOTE — ED Provider Notes (Addendum)
  Physical Exam  BP 104/81   Pulse 85   Temp 97.9 F (36.6 C) (Oral)   Resp 19   Ht 5\' 1"  (1.549 m)   Wt 48.4 kg   SpO2 93%   BMI 20.16 kg/m   Physical Exam  Procedures  Procedures  ED Course / MDM   Clinical Course as of 05/31/23 0739  Sat May 28, 2023  2353 I personally viewed the images from radiology studies and agree with radiologist interpretation: Extremity xrays are neg.  [CS]  Sun May 29, 2023  0024 CBC is unremarkable.  [CS]  0044 CMP with mild hyponatremia, otherwise unremarkable.  [CS]  [id number] I personally viewed the images from radiology studies and agree with radiologist interpretation: CT neg for traumatic injury [CS]  0055 UA is clear.  [CS]  0155 Patient resting comfortably in bed. No distress. Discussed results with family at bedside, no indication for inpatient admission. They have concerns about caring for her at home, the aide that was there when she fell has been fired and they do not have another assigned. Will hold in ED for [facility] evaluation. Family will see if they can find other family members who can help out at home until a new aide can be assigned.  [CS]    Clinical Course User Index [CS] Charmayne Cooper, MD   Medical Decision Making Amount and/or Complexity of Data Reviewed Labs: ordered. Radiology: ordered.  Risk Prescription drug management.   Patient.  Pending TOC evaluation.  Reportedly however family does not want to lose her check and want her to go home.  Has UTI.  Cultures been running but not resulted yet.  Will continue Keflex.  Will discharge home with PCP follow-up.  Home health is being arranged with social work.      Mozell Arias, MD 05/31/23 Dulcie Gibes    Mozell Arias, MD 05/31/23 [phone]

## 2023-05-31 NOTE — Discharge Instructions (Addendum)
 Follow up with your doctor

## 2023-06-01 LAB — URINE CULTURE

## 2023-07-11 ENCOUNTER — Telehealth: Payer: Self-pay | Admitting: Cardiology

## 2023-07-11 ENCOUNTER — Encounter: Payer: Self-pay | Admitting: Cardiology

## 2023-07-11 NOTE — Telephone Encounter (Signed)
 Pt c/o swelling: STAT is pt has developed SOB within 24 hours  How much weight have you gained and in what time span? No  If swelling, where is the swelling located? Mainly in her feet   Are you currently taking a fluid pill? Yes   Are you currently SOB? No  Do you have a log of your daily weights (if so, list)? No  Have you gained 3 pounds in a day or 5 pounds in a week? Unsure  Have you traveled recently? No

## 2023-07-12 NOTE — Telephone Encounter (Signed)
 Responded via mychart message  According to Mychart message patient stated she was only taking 40 Mg torsemide .  Advised patient caregiver to encourage patient to take her extra torsemide  40 mg as needed for SOB, and weight gain and encourage to drink an adequate amount of water .

## 2023-08-03 ENCOUNTER — Ambulatory Visit: Admitting: Orthopedic Surgery

## 2023-08-05 ENCOUNTER — Ambulatory Visit: Admitting: Orthopedic Surgery

## 2023-08-05 DIAGNOSIS — M25561 Pain in right knee: Secondary | ICD-10-CM | POA: Diagnosis not present

## 2023-08-05 DIAGNOSIS — G8929 Other chronic pain: Secondary | ICD-10-CM | POA: Diagnosis not present

## 2023-08-05 DIAGNOSIS — M25511 Pain in right shoulder: Secondary | ICD-10-CM | POA: Diagnosis not present

## 2023-08-05 MED ORDER — METHYLPREDNISOLONE ACETATE 40 MG/ML IJ SUSP
40.0000 mg | Freq: Once | INTRAMUSCULAR | Status: AC
  Administered 2023-08-05: 40 mg via INTRA_ARTICULAR

## 2023-08-05 NOTE — Progress Notes (Signed)
 Orthopaedic Clinic Return  Assessment: [name] is a 89 y.o. female with the following: Right shoulder pain Right knee pain  Plan: Mrs. [name] continues to have right knee and right shoulder pain.  According to her daughter, she is complaining of this more frequently.  Previous injections have been helpful.  She would like to proceed with injections of both joints.  These were completed without issues today.  Follow-up as needed.   Procedure note injection Right shoulder    Verbal consent was obtained to inject the right shoulder, subacromial space Timeout was completed to confirm the site of injection.  The skin was prepped with alcohol and ethyl chloride was sprayed at the injection site.  A 21-gauge needle was used to inject 40 mg of Depo-Medrol  and 1% lidocaine  (4 cc) into the subacromial space of the left shoulder using a posterolateral approach.  There were no complications. A sterile bandage was applied.   Procedure note injection Right knee joint   Verbal consent was obtained to inject the right knee joint  Timeout was completed to confirm the site of injection.  The skin was prepped with alcohol and ethyl chloride was sprayed at the injection site.  A 21-gauge needle was used to inject 40 mg of Depo-Medrol  and 1% lidocaine  (4 cc) into the right knee using an anterolateral approach.  There were no complications. A sterile bandage was applied.   Follow-up: Return if symptoms worsen or fail to improve.   Subjective:  Chief Complaint  Patient presents with   Injections    Right shoulder, right knee injection. Right shoulder hurts worse than the left.     History of Present Illness: [name] is a 89 y.o. female who returns to clinic for evaluation of the right knee and the right shoulder pain.  She is well-known to my clinic.  She continues to have chronic pain in both her right knee, as well as both shoulders.  According to her daughter, she is complaining of a  lot of pain in the right side.  She also has some pain in the left, but it is not bothering her as much the right.  She is interested in repeat injections today.   Review of Systems: No fevers or chills No numbness or tingling No chest pain No shortness of breath No bowel or bladder dysfunction No GI distress No headaches   Objective: There were no vitals taken for this visit.  Physical Exam:  Seated in a wheelchair.  Hard of hearing.  Right shoulder without deformity.  Difficult with overhead motion.  No swelling.  No obvious deformity.  Humeral head is palpable, just below the acromion. Right knee with mild effusion.  Tenderness palpation medial lateral joint lines.  She is only able to achieve full extension, range of motion from 10-100 degrees.  Negative Lachman.  IMAGING: I personally ordered and reviewed the following images:  No new imaging obtained today.   Oneil DELENA Horde, MD 08/05/2023 11:46 AM

## 2023-08-05 NOTE — Addendum Note (Signed)
 Addended by: MARCINE HUSBAND T on: 08/05/2023 12:08 PM   Modules accepted: Orders

## 2023-08-05 NOTE — Patient Instructions (Signed)

## 2023-10-14 ENCOUNTER — Encounter: Payer: Self-pay | Admitting: Emergency Medicine

## 2023-10-14 ENCOUNTER — Emergency Department

## 2023-10-14 ENCOUNTER — Other Ambulatory Visit: Payer: Self-pay

## 2023-10-14 ENCOUNTER — Observation Stay
Admission: EM | Admit: 2023-10-14 | Discharge: 2023-10-16 | Disposition: A | Discharge status: Home / Self Care | Attending: Internal Medicine | Admitting: Internal Medicine

## 2023-10-14 DIAGNOSIS — I5032 Chronic diastolic (congestive) heart failure: Secondary | ICD-10-CM | POA: Diagnosis not present

## 2023-10-14 DIAGNOSIS — I110 Hypertensive heart disease with heart failure: Secondary | ICD-10-CM | POA: Diagnosis not present

## 2023-10-14 DIAGNOSIS — F0390 Unspecified dementia without behavioral disturbance: Secondary | ICD-10-CM | POA: Diagnosis present

## 2023-10-14 DIAGNOSIS — L02612 Cutaneous abscess of left foot: Secondary | ICD-10-CM | POA: Insufficient documentation

## 2023-10-14 DIAGNOSIS — L03116 Cellulitis of left lower limb: Secondary | ICD-10-CM | POA: Diagnosis present

## 2023-10-14 DIAGNOSIS — J449 Chronic obstructive pulmonary disease, unspecified: Secondary | ICD-10-CM | POA: Diagnosis present

## 2023-10-14 DIAGNOSIS — Z87891 Personal history of nicotine dependence: Secondary | ICD-10-CM | POA: Insufficient documentation

## 2023-10-14 DIAGNOSIS — R4182 Altered mental status, unspecified: Secondary | ICD-10-CM | POA: Diagnosis not present

## 2023-10-14 DIAGNOSIS — G9341 Metabolic encephalopathy: Secondary | ICD-10-CM

## 2023-10-14 DIAGNOSIS — L03032 Cellulitis of left toe: Secondary | ICD-10-CM | POA: Insufficient documentation

## 2023-10-14 DIAGNOSIS — Z96642 Presence of left artificial hip joint: Secondary | ICD-10-CM | POA: Diagnosis not present

## 2023-10-14 DIAGNOSIS — R0602 Shortness of breath: Secondary | ICD-10-CM | POA: Insufficient documentation

## 2023-10-14 DIAGNOSIS — R221 Localized swelling, mass and lump, neck: Secondary | ICD-10-CM | POA: Insufficient documentation

## 2023-10-14 LAB — COMPREHENSIVE METABOLIC PANEL WITH GFR
ALT: 20 U/L (ref 0–44)
AST: 31 U/L (ref 15–41)
Albumin: 3.9 g/dL (ref 3.5–5.0)
Alkaline Phosphatase: 95 U/L (ref 38–126)
Anion gap: 16 — ABNORMAL HIGH (ref 5–15)
BUN: 31 mg/dL — ABNORMAL HIGH (ref 8–23)
CO2: 25 mmol/L (ref 22–32)
Calcium: 9.4 mg/dL (ref 8.9–10.3)
Chloride: 98 mmol/L (ref 98–111)
Creatinine, Ser: 1.35 mg/dL — ABNORMAL HIGH (ref 0.44–1.00)
GFR, Estimated: 38 mL/min — ABNORMAL LOW (ref >60)
Glucose, Bld: 111 mg/dL — ABNORMAL HIGH (ref 70–99)
Potassium: 5.2 mmol/L — ABNORMAL HIGH (ref 3.5–5.1)
Sodium: 139 mmol/L (ref 135–145)
Total Bilirubin: 0.5 mg/dL (ref 0.0–1.2)
Total Protein: 7.3 g/dL (ref 6.5–8.1)

## 2023-10-14 LAB — CBC WITH DIFFERENTIAL/PLATELET
Abs Immature Granulocytes: 0.03 K/uL (ref 0.00–0.07)
Basophils Absolute: 0.1 K/uL (ref 0.0–0.1)
Basophils Relative: 1 %
Eosinophils Absolute: 0.2 K/uL (ref 0.0–0.5)
Eosinophils Relative: 2 %
HCT: 34.2 % — ABNORMAL LOW (ref 36.0–46.0)
Hemoglobin: 10.7 g/dL — ABNORMAL LOW (ref 12.0–15.0)
Immature Granulocytes: 0 %
Lymphocytes Relative: 16 %
Lymphs Abs: 1.3 K/uL (ref 0.7–4.0)
MCH: 28.4 pg (ref 26.0–34.0)
MCHC: 31.3 g/dL (ref 30.0–36.0)
MCV: 90.7 fL (ref 80.0–100.0)
Monocytes Absolute: 1.2 K/uL — ABNORMAL HIGH (ref 0.1–1.0)
Monocytes Relative: 14 %
Neutro Abs: 5.7 K/uL (ref 1.7–7.7)
Neutrophils Relative %: 67 %
Platelets: 211 K/uL (ref 150–400)
RBC: 3.77 MIL/uL — ABNORMAL LOW (ref 3.87–5.11)
RDW: 13.7 % (ref 11.5–15.5)
WBC: 8.4 K/uL (ref 4.0–10.5)
nRBC: 0 % (ref 0.0–0.2)

## 2023-10-14 LAB — RESP PANEL BY RT-PCR (RSV, FLU A&B, COVID)  RVPGX2
Influenza A by PCR: NEGATIVE
Influenza B by PCR: NEGATIVE
Resp Syncytial Virus by PCR: NEGATIVE
SARS Coronavirus 2 by RT PCR: NEGATIVE

## 2023-10-14 MED ORDER — SODIUM CHLORIDE 0.9 % IV SOLN
1.0000 g | Freq: Once | INTRAVENOUS | Status: AC
  Administered 2023-10-14: 1 g via INTRAVENOUS
  Filled 2023-10-14: qty 10

## 2023-10-14 MED ORDER — SODIUM CHLORIDE 0.9 % IV BOLUS
500.0000 mL | Freq: Once | INTRAVENOUS | Status: AC
  Administered 2023-10-14: 500 mL via INTRAVENOUS

## 2023-10-14 NOTE — ED Notes (Signed)
Pt given cup of water per request.

## 2023-10-14 NOTE — ED Notes (Signed)
 Fall risk bundle is currently in place.

## 2023-10-14 NOTE — ED Provider Notes (Signed)
 [facility] Provider Note    Event Date/Time   First MD Initiated Contact with Patient 10/14/23 2150     (approximate)   History   Foot Swelling  First Nurse Note: Pt BIB [facility]. Pt comes from home was seen by [facility] RN and had redness to  left foot, last 3 toes left foot are cold. And red. Per family pt seems to have altered mental status. Per family [facility] RN informed them if they feel like foot gets worse to call EMS.  110/68, 33F, 80HR, CBG 128  Long list of allergies and medications DNR    pt BIB [facility]. Pt comes from home was seen by [facility] RN and had redness to left foot, last 3 toes left foot are cold. And red. Per family pt seems to have altered mental status. Per family [facility] RN informed them if they feel like foot gets worse to call EMS. Left foot is red and swollen around great toe. +pedal pulse.   HPI [name] is a 89 y.o. female PMH COPD, GERD, pulmonary fibrosis, dementia, CHF presents for evaluation of left foot redness, AMS - Patient a limited historian.  Has no complaints though does note her left foot is somewhat red -Collateral gathered from daughter, see ED course below.  Primarily sent here due to altered mental status/combativeness no redness and swelling of left foot.  Also complained of some difficulty breathing earlier.     Physical Exam   Triage Vital Signs: ED Triage Vitals  Encounter Vitals Group     BP 10/14/23 2118 (!) 147/76     Girls Systolic BP Percentile --      Girls Diastolic BP Percentile --      Boys Systolic BP Percentile --      Boys Diastolic BP Percentile --      Pulse Rate 10/14/23 2118 83     Resp 10/14/23 2118 18     Temp 10/14/23 2118 98.3 F (36.8 C)     Temp Source 10/14/23 2118 Oral     SpO2 10/14/23 2120 97 %     Weight --      Height --      Head Circumference --      Peak Flow --      Pain Score 10/14/23 2118 0     Pain Loc --      Pain Education --      Exclude from Growth  Chart --     Most recent vital signs: Vitals:   10/14/23 2255 10/14/23 2330  BP: 131/76 135/85  Pulse: 78 83  Resp:    Temp:    SpO2: 95% 97%     General: Awake, no distress.  HEENT: Normocephalic, atraumatic.  Large chronic appearing soft tissue mass to left posterior neck. CV:  Good peripheral perfusion. RRR, RP 2+ Resp:  Normal effort. CTAB Abd:  No distention. Nontender to deep palpation throughout LLE:   Mild swelling and redness on dorsum of left foot, no deformity appreciated, all digits are equally warm to touch with brisk capillary refill, pedal pulse 2+.  No necrotic tissue, no crepitus.  Does feel somewhat warmer and is certainly somewhat more red than right foot.   ED Results / Procedures / Treatments   Labs (all labs ordered are listed, but only abnormal results are displayed) Labs Reviewed  CBC WITH DIFFERENTIAL/PLATELET - Abnormal; Notable for the following components:      Result Value  RBC 3.77 (*)    Hemoglobin 10.7 (*)    HCT 34.2 (*)    Monocytes Absolute 1.2 (*)    All other components within normal limits  COMPREHENSIVE METABOLIC PANEL WITH GFR - Abnormal; Notable for the following components:   Potassium 5.2 (*)    Glucose, Bld 111 (*)    BUN 31 (*)    Creatinine, Ser 1.35 (*)    GFR, Estimated 38 (*)    Anion gap 16 (*)    All other components within normal limits  RESP PANEL BY RT-PCR (RSV, FLU A&B, COVID)  RVPGX2  URINALYSIS, ROUTINE W REFLEX MICROSCOPIC     EKG  N/a   RADIOLOGY Radiology interpreted by myself and radiology report reviewed.  No acute pathology identified.  CT head pending.    PROCEDURES:  Critical Care performed: No  Procedures   MEDICATIONS ORDERED IN ED: Medications  cefTRIAXone  (ROCEPHIN ) 1 g in sodium chloride  0.9 % 100 mL IVPB (1 g Intravenous New Bag/Given 10/14/23 2338)  sodium chloride  0.9 % bolus 500 mL (0 mLs Intravenous Stopped 10/14/23 2323)     IMPRESSION / MDM / ASSESSMENT AND PLAN / ED  COURSE  I reviewed the triage vital signs and the nursing notes.                              DDX/MDM/AP: Differential diagnosis includes, but is not limited to, infectious or metabolic encephalopathy, consider less likely CVA or intracranial hemorrhage.  Patient does appear to have a cellulitis of her left foot, this may be contributing to her altered mental status.  Given her report of combativeness at home and limited resources to care for her over the weekend I do anticipate she may require admission for further evaluation and management.  Plan: - Labs  -chest x-ray, x-ray left foot - CT head - Consider admission  Patient's presentation is most consistent with acute presentation with potential threat to life or bodily function.  The patient is on the cardiac monitor to evaluate for evidence of arrhythmia and/or significant heart rate changes.  ED course below.  Chest x-ray and x-ray left foot unremarkable.  Labs with mild AKI and very mild hyperkalemia, treating with small bolus IV fluid.  Also treated with ceftriaxone  for empiric coverage of possible UTI and apparent cellulitis.  Signed out to overnight ED provider Dr. Claudene pending urinalysis and disposition.  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  Does not desire long-term  placement in a nursing facility however.  [MM]  2248 CMP with very mild hyperkalemia and mild bump in creatinine, will give IV fluid [MM]    Clinical Course User Index [MM] Clarine Ozell LABOR, MD     FINAL CLINICAL IMPRESSION(S) / ED DIAGNOSES  Final diagnoses:  Altered mental status, unspecified altered mental status type  Cellulitis and abscess of toe of left foot     Rx / DC Orders   ED Discharge Orders     None        Note:  This document was prepared using Dragon voice recognition software and may include unintentional dictation errors.   Clarine Ozell LABOR, MD 10/14/23 [phone]

## 2023-10-14 NOTE — ED Triage Notes (Signed)
 pt BIB [facility]. Pt comes from home was seen by [facility] RN and had redness to left foot, last 3 toes left foot are cold. And red. Per family pt seems to have altered mental status. Per family [facility] RN informed them if they feel like foot gets worse to call EMS. Left foot is red and swollen around great toe. +pedal pulse.

## 2023-10-14 NOTE — ED Triage Notes (Signed)
 First Nurse Note: Pt BIB [facility]. Pt comes from home was seen by [facility] RN and had redness to  left foot, last 3 toes left foot are cold. And red. Per family pt seems to have altered mental status. Per family [facility] RN informed them if they feel like foot gets worse to call EMS.  110/68, 66F, 80HR, CBG 128  Long list of allergies and medications DNR

## 2023-10-15 ENCOUNTER — Other Ambulatory Visit: Payer: Self-pay

## 2023-10-15 DIAGNOSIS — L03116 Cellulitis of left lower limb: Secondary | ICD-10-CM | POA: Diagnosis present

## 2023-10-15 DIAGNOSIS — G9341 Metabolic encephalopathy: Secondary | ICD-10-CM

## 2023-10-15 LAB — URINALYSIS, ROUTINE W REFLEX MICROSCOPIC
Bilirubin Urine: NEGATIVE
Glucose, UA: NEGATIVE mg/dL
Hgb urine dipstick: NEGATIVE
Ketones, ur: NEGATIVE mg/dL
Nitrite: NEGATIVE
Protein, ur: NEGATIVE mg/dL
Specific Gravity, Urine: 1.008 (ref 1.005–1.030)
pH: 6 (ref 5.0–8.0)

## 2023-10-15 MED ORDER — KETOROLAC TROMETHAMINE 15 MG/ML IJ SOLN: 15.0000 mg | Freq: Four times a day (QID) | INTRAMUSCULAR | Status: DC | PRN

## 2023-10-15 MED ORDER — ACETAMINOPHEN 325 MG PO TABS
650.0000 mg | ORAL_TABLET | Freq: Four times a day (QID) | ORAL | Status: DC | PRN
  Administered 2023-10-15 (×2): 650 mg via ORAL
  Filled 2023-10-15 (×2): qty 2

## 2023-10-15 MED ORDER — HYDROCODONE-ACETAMINOPHEN 5-325 MG PO TABS: 1.0000 | ORAL_TABLET | ORAL | Status: DC | PRN

## 2023-10-15 MED ORDER — ENOXAPARIN SODIUM 30 MG/0.3ML IJ SOSY
30.0000 mg | PREFILLED_SYRINGE | INTRAMUSCULAR | Status: DC
  Administered 2023-10-15 – 2023-10-16 (×2): 30 mg via SUBCUTANEOUS
  Filled 2023-10-15 (×2): qty 0.3

## 2023-10-15 MED ORDER — ONDANSETRON HCL 4 MG PO TABS: 4.0000 mg | ORAL_TABLET | Freq: Four times a day (QID) | ORAL | Status: DC | PRN

## 2023-10-15 MED ORDER — SODIUM CHLORIDE 0.9 % IV SOLN
1.0000 g | INTRAVENOUS | Status: DC
  Administered 2023-10-16: 1 g via INTRAVENOUS
  Filled 2023-10-15: qty 10

## 2023-10-15 MED ORDER — ALPRAZOLAM 0.5 MG PO TABS
0.5000 mg | ORAL_TABLET | Freq: Two times a day (BID) | ORAL | Status: DC
  Administered 2023-10-15 – 2023-10-16 (×2): 0.5 mg via ORAL
  Filled 2023-10-15 (×2): qty 1

## 2023-10-15 MED ORDER — AMOXICILLIN-POT CLAVULANATE 875-125 MG PO TABS
1.0000 | ORAL_TABLET | Freq: Two times a day (BID) | ORAL | 0 refills | Status: AC
  Filled 2023-10-15: qty 14, 7d supply, fill #0

## 2023-10-15 MED ORDER — ONDANSETRON HCL 4 MG/2ML IJ SOLN
4.0000 mg | Freq: Four times a day (QID) | INTRAMUSCULAR | Status: DC | PRN
Start: 2023-10-15 — End: 2023-10-16
  Administered 2023-10-15: 4 mg via INTRAVENOUS
  Filled 2023-10-15: qty 2

## 2023-10-15 MED ORDER — ALPRAZOLAM 0.5 MG PO TABS
0.5000 mg | ORAL_TABLET | Freq: Once | ORAL | Status: AC
  Administered 2023-10-15: 0.5 mg via ORAL
  Filled 2023-10-15: qty 1

## 2023-10-15 MED ORDER — TRAMADOL HCL 50 MG PO TABS
50.0000 mg | ORAL_TABLET | Freq: Once | ORAL | Status: AC
  Administered 2023-10-15: 50 mg via ORAL
  Filled 2023-10-15: qty 1

## 2023-10-15 MED ORDER — ALBUTEROL SULFATE (2.5 MG/3ML) 0.083% IN NEBU
3.0000 mL | INHALATION_SOLUTION | RESPIRATORY_TRACT | Status: DC | PRN
  Administered 2023-10-15 – 2023-10-16 (×2): 3 mL via RESPIRATORY_TRACT
  Filled 2023-10-15 (×2): qty 3

## 2023-10-15 MED ORDER — IPRATROPIUM-ALBUTEROL 0.5-2.5 (3) MG/3ML IN SOLN: 3.0000 mL | RESPIRATORY_TRACT | Status: DC | PRN

## 2023-10-15 MED ORDER — ACETAMINOPHEN 650 MG RE SUPP: 650.0000 mg | Freq: Four times a day (QID) | RECTAL | Status: DC | PRN

## 2023-10-15 MED ORDER — DONEPEZIL HCL 5 MG PO TABS
10.0000 mg | ORAL_TABLET | Freq: Every day | ORAL | Status: DC
  Administered 2023-10-15: 10 mg via ORAL
  Filled 2023-10-15: qty 2

## 2023-10-15 MED ORDER — TRAZODONE HCL 50 MG PO TABS
50.0000 mg | ORAL_TABLET | Freq: Every evening | ORAL | Status: DC | PRN
Start: 2023-10-15 — End: 2023-10-16
  Administered 2023-10-15: 50 mg via ORAL
  Filled 2023-10-15: qty 1

## 2023-10-15 NOTE — ED Notes (Signed)
 Spoke with [name], Pts daughter, gave update on pts disposition.

## 2023-10-15 NOTE — Assessment & Plan Note (Signed)
 Not acutely exacerbated DuoNebs as needed

## 2023-10-15 NOTE — Assessment & Plan Note (Signed)
 Clinically euvolemic.  Holding furosemide 

## 2023-10-15 NOTE — Assessment & Plan Note (Signed)
 Rocephin

## 2023-10-15 NOTE — TOC Progression Note (Signed)
 Transition of Care [facility]) - Progression Note    Patient Details  Name: [name] MRN: [id number] Date of Birth: 1934-07-09  [facility] CM/SW Contact  Marinda Cooks, RN Phone Number: 10/15/2023, 1:10 PM  Clinical Narrative:    This CM alerted  by MD pt medically cleared to dc . This CM spoke with pt's daughter [name](HCPOA) introduced role and discussed pt's dc plan, at this time pt's daughter informed pt has open HH services with [facility] and has DME already. Ms. [name] also informed that she is the primary care giver for pt and she is not ready to take pt home today & did not have DC transportation. This CM alerted medical team of this info. This CM confirmed pt's HH services with Liaison Shaun at [facility] and pt's tentative dc tomm and need for ROC . TOC will cont to follow dc planning and update as applicable.                      Expected Discharge Plan and Services                                               Social Drivers of Health (SDOH) Interventions SDOH Screenings   Food Insecurity: No Food Insecurity (10/15/2023)  Housing: Low Risk  (10/15/2023)  Transportation Needs: No Transportation Needs (10/15/2023)  Utilities: Not At Risk (10/15/2023)  Financial Resource Strain: Low Risk  (08/11/2023)   Received from [facility]  Social Connections: Patient Declined (10/15/2023)  Tobacco Use: High Risk (10/14/2023)    Readmission Risk Interventions    11/29/2022    1:31 PM 07/26/2022   12:28 PM 09/30/2021    9:22 AM  Readmission Risk Prevention Plan  Transportation Screening Complete Complete Complete  PCP or Specialist Appt within 5-7 Days  Not Complete   Home Care Screening  Complete   Medication Review (RN CM)  Complete   Medication Review (RN Care Manager) Complete  Complete  HRI or Home Care Consult Complete  Complete  SW Recovery Care/Counseling Consult Complete  Complete  Palliative Care Screening Not  Applicable  Not Applicable  Skilled Nursing Facility Not Applicable  Complete

## 2023-10-15 NOTE — H&P (Signed)
 History and Physical    Patient: [name] FMW:[id number] DOB: [birth year] DOA: 10/14/2023 DOS: the patient was seen and examined on 10/15/2023 PCP: [name], MD  Patient coming from: Home  Chief Complaint:  Chief Complaint  Patient presents with   Foot Swelling   HPI: [name] is a 89 y.o. female with medical history significant of anxiety, CHF, COPD, GERD, hyperlipidemia, pulmonary fibrosis being admitted with concern for cellulitis left foot and altered mental status.  No family available at bedside.  According to ED provider family who is currently having going to have orthopedic surgery was unable to manage patient due to increased combativeness in the home. In the ED, vitals within normal limits Blood work unremarkable except for mild anemia with hemoglobin 10.4.  Creatinine 1.35 which is near baseline, respiratory viral panel and UA unremarkable. CT head unremarkable, chest x-ray shows chronic interstitial lung disease and a large hiatal hernia and left foot x-ray showing chronic changes without acute abnormality Patient treated with ceftriaxone  Admission requested   Past Medical History:  Diagnosis Date   Anxiety    Chronic diastolic (congestive) heart failure (HCC)    Chronic obstructive pulmonary disease, unspecified (HCC)    COPD (chronic obstructive pulmonary disease) (HCC)    GERD (gastroesophageal reflux disease)    Hyperlipidemia    IBS (irritable bowel syndrome)    Lumbago with sciatica    Osteoporosis    Overactive bladder    Pneumonia    Pulmonary fibrosis (HCC)    Past Surgical History:  Procedure Laterality Date   COLONOSCOPY  10/04/2009   sigmoid diverticula, multiple tubulovillous adneomas, needs surveillance Oct 2013   ESOPHAGOGASTRODUODENOSCOPY  04/16/2011   Dr. Shaaron: Noncritical Schatzkis ring( not manipulated because no dysphagia). Normal esophagus otherwise  large hiatal hernia. Gastric polyp-status post biopsy. Gastric  erosions-staus post biopsy. Abnormal bulb-status post biopsy. benign small bowel, stomach biopsy with ulcerated gastric antral mucosa with foveolar hyperplasia and surface erosion, polyp with inflamed gastric antral mucosa    ESOPHAGOGASTRODUODENOSCOPY N/A 04/29/2014   Dr. Shaaron: Noncritical Schatzki's ring large hiatal hernia. Retained gastric contents.GES with slight delay   femur fracture Left 2023   HIP ARTHROPLASTY Left 09/24/2021   Procedure: ARTHROPLASTY BIPOLAR HIP (HEMIARTHROPLASTY);  Surgeon: Onesimo Oneil LABOR, MD;  Location: [facility];  Service: Orthopedics;  Laterality: Left;   HIP PINNING,CANNULATED Right 04/06/2018   Procedure: CANNULATED HIP PINNING;  Surgeon: Margrette Taft BRAVO, MD;  Location: [facility];  Service: Orthopedics;  Laterality: Right;   Social History:  reports that she has quit smoking. Her smoking use included cigarettes. She has been exposed to tobacco smoke. Her smokeless tobacco use includes snuff. She reports that she does not drink alcohol and does not use drugs.  Allergies  Allergen Reactions   Codeine Anaphylaxis, Shortness Of Breath and Rash   Neurontin [Gabapentin] Anaphylaxis   Sulfonamide Derivatives Hives and Shortness Of Breath   Aloe Other (See Comments)    Unknown reaction   Levaquin [Levofloxacin] Other (See Comments)    Pt admitted to hospital with lung problems due to this medication.   Ms Contin  [Morphine ] Other (See Comments)    Patient's daughter reports hypoxia requiring O2 with morphine  and similar opiates.  But does okay with tramadol .   Septra [Sulfamethoxazole-Trimethoprim] Hives   Sulfa Antibiotics Hives   Trimethoprim Hives   Vibra -Tab [Doxycycline ] Hives    Family History  Problem Relation Age of Onset   Stroke Mother    Diabetes Mother  Heart disease Father    Colon cancer Neg Hx     Prior to Admission medications   Medication Sig Start Date End Date Taking? Authorizing Provider  acetaminophen  (TYLENOL ) 500 MG tablet Take 1  tablet (500 mg total) by mouth every 8 (eight) hours as needed. Patient taking differently: Take 500 mg by mouth every 8 (eight) hours as needed for mild pain (pain score 1-3) or moderate pain (pain score 4-6). 08/26/22   Patsey Lot, MD  albuterol  (PROAIR  HFA) 108 (90 Base) MCG/ACT inhaler Inhale 2 puffs into the lungs every 4 (four) hours as needed for wheezing or shortness of breath. Patient taking differently: No sig reported 06/12/21   Vicci Pen L, MD  ALPRAZolam  (XANAX ) 0.5 MG tablet Take 1 tablet (0.5 mg total) by mouth 2 (two) times daily. 07/29/22   Evonnie Lenis, MD  cephALEXin  (KEFLEX ) 500 MG capsule Take 1 capsule (500 mg total) by mouth 2 (two) times daily. 05/31/23   Patsey Lot, MD  CRANBERRY PO Take 1 tablet by mouth daily.    [provider]  diclofenac  Sodium (VOLTAREN ) 1 % GEL APPLY AS DIRECTED TO THE AFFECTED KNEE FOUR TIMES DAILY. 08/04/20   Margrette Taft BRAVO, MD  donepezil  (ARICEPT ) 10 MG tablet Take 10 mg by mouth at bedtime. 07/25/19   [provider]  fluticasone  furoate-vilanterol (BREO ELLIPTA ) 100-25 MCG/ACT AEPB Inhale 1 puff into the lungs daily.    [provider]  furosemide  (LASIX ) 40 MG tablet Take 1 tablet (40 mg total) by mouth daily. With an extra 40 Mg tablet as needed for swelling, shortness of breath and weight gain. 04/29/23   Miriam Norris, NP  ipratropium-albuterol  (DUONEB) 0.5-2.5 (3) MG/3ML SOLN Take 3 mLs by nebulization in the morning, at noon, in the evening, and at bedtime.    [provider]  LINZESS  145 MCG CAPS capsule Take 145 mcg by mouth daily as needed (constipation). 05/22/21   [provider]  loratadine  (CLARITIN ) 10 MG tablet Take 10 mg by mouth daily. 08/29/21   [provider]  memantine  (NAMENDA ) 10 MG tablet Take 10 mg by mouth 2 (two) times daily.    [provider]  midodrine  (PROAMATINE ) 5 MG tablet Take 1 tablet (5 mg total) by mouth 2 (two) times daily with a  meal. 01/21/23   Debera Jayson MATSU, MD  Multiple Vitamins-Minerals (ONE A DAY WOMEN 50 PLUS PO) Take 1 tablet by mouth daily.    [provider]  pantoprazole  (PROTONIX ) 40 MG tablet TAKE (1) TABLET BY MOUTH TWICE A DAY WITH MEALS (BREAKFAST AND SUPPER) 05/24/19   Marvis Camellia LABOR, NP  potassium chloride  (KLOR-CON ) 10 MEQ tablet Take 1 tablet (10 mEq total) by mouth daily. When taking lasix . 11/30/22   Ricky Fines, MD  simvastatin  (ZOCOR ) 10 MG tablet Take 10 mg by mouth at bedtime. 02/27/21   [provider]  traMADol  (ULTRAM ) 50 MG tablet Take 1 tablet (50 mg total) by mouth every 6 (six) hours as needed for moderate pain or severe pain. Patient taking differently: Take 50 mg by mouth every 4 (four) hours. 07/29/22   Evonnie Lenis, MD  traZODone  (DESYREL ) 50 MG tablet Take 1 tablet by mouth at bedtime as needed for sleep. 05/02/23 05/01/24  [provider]  zolpidem  (AMBIEN ) 5 MG tablet Take 1 tablet (5 mg total) by mouth at bedtime. 07/29/22   Evonnie Lenis, MD    Physical Exam: Vitals:   10/15/23 0000 10/15/23 0030 10/15/23 0100 10/15/23  0144  BP: 127/77 118/73 132/86 139/87  Pulse: 87 87 86 86  Resp:    20  Temp:   98.4 F (36.9 C) 98.3 F (36.8 C)  TempSrc:      SpO2: 93% 95% 94% 95%   Physical Exam Vitals and nursing note reviewed.  Constitutional:      General: She is not in acute distress.    Comments: Frail elderly female, pleasant, oriented to person and place  HENT:     Head: Normocephalic and atraumatic.     Comments: Cyst on neck(present for many years)-see pic Cardiovascular:     Rate and Rhythm: Normal rate and regular rhythm.     Heart sounds: Normal heart sounds.  Pulmonary:     Effort: Pulmonary effort is normal.     Breath sounds: Normal breath sounds.  Abdominal:     Palpations: Abdomen is soft.     Tenderness: There is no abdominal tenderness.  Musculoskeletal:     Comments: See pic below  Neurological:     Mental Status: Mental status  is at baseline.        Data Reviewed: Relevant notes from primary care and specialist visits, past discharge summaries as available in EHR, including Care Everywhere. Prior diagnostic testing as pertinent to current admission diagnoses Updated medications and problem lists for reconciliation ED course, including vitals, labs, imaging, treatment and response to treatment Triage notes, nursing and pharmacy notes and ED provider's notes Notable results as noted in HPI   Assessment and Plan: * Cellulitis of left foot Rocephin   Acute metabolic encephalopathy Baseline dementia Caregiver unable to care for patient Reported combativeness at home--possible delirium Patient appeared reasonable at the time of assessment And neurologic checks with fall precautions Delirium precautions TOC consult  Chronic diastolic CHF (congestive heart failure) (HCC) Clinically euvolemic.  Holding furosemide   COPD (chronic obstructive pulmonary disease) (HCC) Not acutely exacerbated DuoNebs as needed      Advance Care Planning:   Code Status: Limited: Do not attempt resuscitation (DNR) -DNR-LIMITED -Do Not Intubate/DNI    Consults: none  Family Communication: none  Severity of Illness: The appropriate patient status for this patient is OBSERVATION. Observation status is judged to be reasonable and necessary in order to provide the required intensity of service to ensure the patient's safety. The patient's presenting symptoms, physical exam findings, and initial radiographic and laboratory data in the context of their medical condition is felt to place them at decreased risk for further clinical deterioration. Furthermore, it is anticipated that the patient will be medically stable for discharge from the hospital within 2 midnights of admission.   Author: Delayne LULLA Solian, MD 10/15/2023 3:19 AM  For on call review [email or web address].

## 2023-10-15 NOTE — Evaluation (Signed)
 Physical Therapy Evaluation Patient Details Name: [name] MRN: [id number] DOB: 04/09/1934 Today's Date: 10/15/2023  History of Present Illness  Pt is an 89 y/o F admitted on 10/14/23 after presenting with c/o L foot redness, AMS. Pt is being treated for L foot cellulitis. PMH: anxiety, CHF, COPD, GERD, HLD, pulmonary fibrosis, IBS, dementia  Clinical Impression  Pt seen for PT evaluation with pt received in bed reporting feeling sick to her stomach but agreeable to participate. Pt reports prior to admission she was ambulatory with RW & assistance from family member (granddaughter confirms via telephone, unable to reach daughter). Pt is able to complete bed mobility with min assist for supine>sit but requires heavy assist to scoot to sitting EOB, reporting this bed is higher than hers at home. Pt requires assistance to power up to standing from EOB & recliner but is able to ambulate short distance with RW & min assist, chair follow for rest breaks PRN & gait distance limited by c/o pain. Recommend ongoing PT services to progress mobility as able.         If plan is discharge home, recommend the following: A little help with walking and/or transfers;A little help with bathing/dressing/bathroom;Assistance with cooking/housework;Other (comment);Assist for transportation;Supervision due to cognitive status;Direct supervision/assist for financial management;Help with stairs or ramp for entrance;Direct supervision/assist for medications management   Can travel by private vehicle        Equipment Recommendations None recommended by PT (per chart, pt has BSC & RW)  Recommendations for Other Services       Functional Status Assessment Patient has had a recent decline in their functional status and demonstrates the ability to make significant improvements in function in a reasonable and predictable amount of time.     Precautions / Restrictions Precautions Precautions:  Fall Restrictions Weight Bearing Restrictions Per Provider Order: No      Mobility  Bed Mobility Overal bed mobility: Needs Assistance Bed Mobility: Supine to Sit     Supine to sit: Min assist, Used rails, HOB elevated (min assist to come to sitting EOB on R side)     General bed mobility comments: mod<>max assist to scoot to sitting EOB, decreased ability to sequence/motor plan    Transfers Overall transfer level: Needs assistance Equipment used: Rolling walker (2 wheels) Transfers: Sit to/from Stand, Bed to chair/wheelchair/BSC Sit to Stand: Min assist, Mod assist   Step pivot transfers: Min assist (with RW, cuing re: sequencing, decreased ability to turn but does step to R to recliner well)       General transfer comment: min assist for sit>stand from EOB, mod assist on first attempt, min assist on 2nd attempt to transfer sit>stand from recliner with cuing re: hand placement, assistance to power up to standing    Ambulation/Gait Ambulation/Gait assistance: Min assist Gait Distance (Feet): 8 Feet Assistive device: Rolling walker (2 wheels) Gait Pattern/deviations: Decreased dorsiflexion - right, Decreased step length - left, Decreased step length - right, Decreased stride length, Decreased dorsiflexion - left Gait velocity: decreased     General Gait Details: PT provides chair follow for rest breaks PRN; gait distance limited by pt c/o foot pain.  Stairs            Wheelchair Mobility     Tilt Bed    Modified Rankin (Stroke Patients Only)       Balance Overall balance assessment: Needs assistance Sitting-balance support: Feet supported Sitting balance-Leahy Scale: Fair     Standing balance support: Bilateral upper  extremity supported, During functional activity, Reliant on assistive device for balance Standing balance-Leahy Scale: Fair                               Pertinent Vitals/Pain Pain Assessment Pain Assessment: Faces Faces  Pain Scale: Hurts little more Pain Location: feet Pain Descriptors / Indicators: Discomfort, Guarding Pain Intervention(s): Limited activity within patient's tolerance, Repositioned (nurse & MD made aware of pt's c/o)    Home Living Family/patient expects to be discharged to:: Private residence Living Arrangements: Children Available Help at Discharge: Personal care attendant;Family;Available 24 hours/day Type of Home: House Home Access: Ramped entrance       Home Layout: One level Home Equipment: Shower seat;Hand held shower head;Grab bars - tub/shower;Grab bars - toilet;Rolling Walker (2 wheels);Hospital bed;Wheelchair - manual;BSC/3in1 Additional Comments: some information obtained from previous chart entry, granddaughter confirmed house is 1 level with ramped entry & pt has PCA 9am-5pm Monday-Friday with daughter/granddaughter present to assist the other times. Reports house is not w/c accessible.    Prior Function Prior Level of Function : Needs assist             Mobility Comments: Pt reports she was ambulating with RW & 1 assist.       Extremity/Trunk Assessment   Upper Extremity Assessment Upper Extremity Assessment: Generalized weakness    Lower Extremity Assessment Lower Extremity Assessment: Generalized weakness    Cervical / Trunk Assessment Cervical / Trunk Assessment: Kyphotic (cyst on L side of neck)  Communication   Communication Communication: Impaired Factors Affecting Communication: Hearing impaired    Cognition Arousal: Alert     PT - Cognitive impairments: History of cognitive impairments, Awareness, Safety/Judgement, Problem solving                       PT - Cognition Comments: Per chart, pt with hx of dementia. States she was told she is going to rehab & repeatedly asks where is rehab? Following commands: Impaired Following commands impaired: Follows one step commands with increased time     Cueing Cueing Techniques: Verbal  cues, Gestural cues, Tactile cues, Visual cues     General Comments      Exercises     Assessment/Plan    PT Assessment Patient needs continued PT services  PT Problem List Decreased strength;Pain;Decreased range of motion;Decreased cognition;Decreased activity tolerance;Decreased knowledge of use of DME;Decreased balance;Decreased knowledge of precautions;Decreased safety awareness;Decreased mobility       PT Treatment Interventions DME instruction;Gait training;Neuromuscular re-education;Balance training;Therapeutic activities;Therapeutic exercise;Patient/family education;Functional mobility training;Wheelchair mobility training    PT Goals (Current goals can be found in the Care Plan section)  Acute Rehab PT Goals Patient Stated Goal: decreased pain PT Goal Formulation: With patient Time For Goal Achievement: 10/29/23 Potential to Achieve Goals: Good    Frequency Min 2X/week     Co-evaluation               AM-PAC PT 6 Clicks Mobility  Outcome Measure Help needed turning from your back to your side while in a flat bed without using bedrails?: A Little Help needed moving from lying on your back to sitting on the side of a flat bed without using bedrails?: A Lot Help needed moving to and from a bed to a chair (including a wheelchair)?: A Little Help needed standing up from a chair using your arms (e.g., wheelchair or bedside chair)?: A Little Help needed to  walk in hospital room?: A Little Help needed climbing 3-5 steps with a railing? : A Lot 6 Click Score: 16    End of Session   Activity Tolerance: Patient limited by pain;Patient tolerated treatment well Patient left: in chair;with chair alarm set;with call bell/phone within reach Nurse Communication: Mobility status PT Visit Diagnosis: Unsteadiness on feet (R26.81);Pain;Muscle weakness (generalized) (M62.81) Pain - Right/Left:  (bilateral) Pain - part of body: Ankle and joints of foot    Time: 8880-8870 PT  Time Calculation (min) (ACUTE ONLY): 10 min   Charges:   PT Evaluation $PT Eval Low Complexity: 1 Low   PT General Charges $$ ACUTE PT VISIT: 1 Visit         Richerd Pinal, PT, DPT 10/15/23, 11:47 AM   Richerd CHRISTELLA Pinal 10/15/2023, 11:45 AM

## 2023-10-15 NOTE — Assessment & Plan Note (Addendum)
 Baseline dementia Caregiver unable to care for patient Reported combativeness at home--possible delirium Patient appeared reasonable at the time of assessment And neurologic checks with fall precautions Delirium precautions TOC consult

## 2023-10-15 NOTE — Progress Notes (Signed)
 Anticoagulation monitoring(Lovenox ):  89 yo female ordered Lovenox  40 mg Q24h    Filed Weights   10/15/23 0300  Weight: 53.4 kg (117 lb 11.2 oz)   BMI 22.2    Lab Results  Component Value Date   CREATININE 1.35 (H) 10/14/2023   CREATININE 1.27 (H) 05/30/2023   CREATININE 1.19 (H) 05/28/2023   Estimated Creatinine Clearance: 22.2 mL/min (A) (by C-G formula based on SCr of 1.35 mg/dL (H)). Hemoglobin & Hematocrit     Component Value Date/Time   HGB 10.7 (L) 10/14/2023 2140   HCT 34.2 (L) 10/14/2023 2140     Per Protocol for Patient with estCrcl < 30 ml/min and BMI < 30, will transition to Lovenox  30 mg Q24h.

## 2023-10-15 NOTE — Progress Notes (Signed)
 Progress Note   Patient: [name] DOB: March 14, 1934 DOA: 10/14/2023     0 DOS: the patient was seen and examined on 10/15/2023   Brief hospital course: [name] is a 89 y.o. female with medical history significant of anxiety, CHF, COPD, GERD, hyperlipidemia, pulmonary fibrosis being admitted with concern for cellulitis left foot and altered mental status.  No family available at bedside.  According to ED provider family who is currently having going to have orthopedic surgery was unable to manage patient due to increased combativeness in the home. In the ED, vitals within normal limits Blood work unremarkable except for mild anemia with hemoglobin 10.4.  Creatinine 1.35 which is near baseline, respiratory viral panel and UA unremarkable. CT head unremarkable, chest x-ray shows chronic interstitial lung disease and a large hiatal hernia and left foot x-ray showing chronic changes without acute abnormality Patient treated with ceftriaxone  Admission requested   Assessment and Plan:   Cellulitis of left foot Continue IV ceftriaxone  Continue lower extremity elevation Has been seen by PT OT and likely home tomorrow   Acute metabolic encephalopathy Baseline dementia Caregiver unable to care for patient Reported combativeness at home--possible delirium Patient appeared reasonable at the time of assessment And neurologic checks with fall precautions Delirium precautions TOC consult   Chronic diastolic CHF (congestive heart failure) (HCC) Clinically euvolemic.  Holding furosemide    COPD (chronic obstructive pulmonary disease) (HCC) Not acutely exacerbated DuoNebs as needed   Chronic swelling behind the left ear Chronic so outpatient follow-up   Advance Care Planning:   Code Status: Limited: Do not attempt resuscitation (DNR) -DNR-LIMITED -Do Not Intubate/DNI     Consults: none   Family Communication: none    Subjective:  Seen and examined at bedside this  morning Mental status improved Lower extremity erythema and pain also improving Family will be ready to receive patient at home tomorrow  Physical Exam:  Vitals and nursing note reviewed.  Constitutional:      General: She is not in acute distress.    Comments: Frail elderly female, pleasant, oriented to person and place  HENT:     Head: Normocephalic and atraumatic.     Comments: Cyst on neck(present for many years)-see pic Cardiovascular:     Rate and Rhythm: Normal rate and regular rhythm.     Heart sounds: Normal heart sounds.  Pulmonary:     Effort: Pulmonary effort is normal.     Breath sounds: Normal breath sounds.  Abdominal:     Palpations: Abdomen is soft.     Tenderness: There is no abdominal tenderness.  Musculoskeletal: Erythema in the lower extremity improved Neurological:     Mental Status: Mental status is at baseline.  Vitals:   10/15/23 0100 10/15/23 0144 10/15/23 0300 10/15/23 0755  BP: 132/86 139/87  138/84  Pulse: 86 86  83  Resp:  20  18  Temp: 98.4 F (36.9 C) 98.3 F (36.8 C)  98 F (36.7 C)  TempSrc:      SpO2: 94% 95%  93%  Weight:   53.4 kg   Height:   5' 1 (1.549 m)     Data Reviewed:    Latest Ref Rng & Units 10/14/2023    9:40 PM 05/30/2023    1:08 PM 05/28/2023   11:43 PM  BMP  Glucose 70 - 99 mg/dL 888  831  896   BUN 8 - 23 mg/dL 31  23  27    Creatinine 0.44 - 1.00 mg/dL  1.35  1.27  1.19   Sodium 135 - 145 mmol/L 139  133  131   Potassium 3.5 - 5.1 mmol/L 5.2  4.0  4.7   Chloride 98 - 111 mmol/L 98  96  97   CO2 22 - 32 mmol/L 25  27  26    Calcium  8.9 - 10.3 mg/dL 9.4  9.0  8.8        Latest Ref Rng & Units 10/14/2023    9:40 PM 05/30/2023    1:08 PM 05/28/2023   11:43 PM  CBC  WBC 4.0 - 10.5 K/uL 8.4  8.6  7.6   Hemoglobin 12.0 - 15.0 g/dL 89.2  88.4  89.3   Hematocrit 36.0 - 46.0 % 34.2  35.4  34.8   Platelets 150 - 400 K/uL 211  294  197        Author: Drue ONEIDA Potter, MD 10/15/2023 11:27 AM  For on call  review [email or web address].

## 2023-10-15 NOTE — Plan of Care (Signed)
  Problem: Health Behavior/Discharge Planning: Goal: Ability to manage health-related needs will improve Outcome: Progressing   Problem: Clinical Measurements: Goal: Will remain free from infection Outcome: Progressing Goal: Respiratory complications will improve Outcome: Progressing Goal: Cardiovascular complication will be avoided Outcome: Progressing   Problem: Nutrition: Goal: Adequate nutrition will be maintained Outcome: Progressing   Problem: Coping: Goal: Level of anxiety will decrease Outcome: Progressing   Problem: Elimination: Goal: Will not experience complications related to urinary retention Outcome: Progressing   Problem: Safety: Goal: Ability to remain free from injury will improve Outcome: Progressing   Problem: Skin Integrity: Goal: Risk for impaired skin integrity will decrease Outcome: Progressing   Problem: Skin Integrity: Goal: Skin integrity will improve Outcome: Progressing

## 2023-10-16 ENCOUNTER — Other Ambulatory Visit: Payer: Self-pay

## 2023-10-16 DIAGNOSIS — L03116 Cellulitis of left lower limb: Secondary | ICD-10-CM | POA: Diagnosis not present

## 2023-10-16 LAB — CBC WITH DIFFERENTIAL/PLATELET
Abs Immature Granulocytes: 0.01 K/uL (ref 0.00–0.07)
Basophils Absolute: 0 K/uL (ref 0.0–0.1)
Basophils Relative: 1 %
Eosinophils Absolute: 0.1 K/uL (ref 0.0–0.5)
Eosinophils Relative: 2 %
HCT: 33.4 % — ABNORMAL LOW (ref 36.0–46.0)
Hemoglobin: 10.6 g/dL — ABNORMAL LOW (ref 12.0–15.0)
Immature Granulocytes: 0 %
Lymphocytes Relative: 20 %
Lymphs Abs: 1 K/uL (ref 0.7–4.0)
MCH: 28.3 pg (ref 26.0–34.0)
MCHC: 31.7 g/dL (ref 30.0–36.0)
MCV: 89.3 fL (ref 80.0–100.0)
Monocytes Absolute: 0.7 K/uL (ref 0.1–1.0)
Monocytes Relative: 15 %
Neutro Abs: 3 K/uL (ref 1.7–7.7)
Neutrophils Relative %: 62 %
Platelets: 198 K/uL (ref 150–400)
RBC: 3.74 MIL/uL — ABNORMAL LOW (ref 3.87–5.11)
RDW: 13.6 % (ref 11.5–15.5)
WBC: 4.9 K/uL (ref 4.0–10.5)
nRBC: 0 % (ref 0.0–0.2)

## 2023-10-16 LAB — BASIC METABOLIC PANEL WITH GFR
Anion gap: 9 (ref 5–15)
BUN: 16 mg/dL (ref 8–23)
CO2: 27 mmol/L (ref 22–32)
Calcium: 9.1 mg/dL (ref 8.9–10.3)
Chloride: 104 mmol/L (ref 98–111)
Creatinine, Ser: 1.12 mg/dL — ABNORMAL HIGH (ref 0.44–1.00)
GFR, Estimated: 48 mL/min — ABNORMAL LOW (ref >60)
Glucose, Bld: 96 mg/dL (ref 70–99)
Potassium: 4.1 mmol/L (ref 3.5–5.1)
Sodium: 140 mmol/L (ref 135–145)

## 2023-10-16 NOTE — Evaluation (Signed)
 Occupational Therapy Evaluation Patient Details Name: [name] MRN: [id number] DOB: 11-14-1934 Today's Date: 10/16/2023   History of Present Illness   Pt is an 89 y/o F admitted on 10/14/23 after presenting with c/o L foot redness, AMS. Pt is being treated for L foot cellulitis. PMH: anxiety, CHF, COPD, GERD, HLD, pulmonary fibrosis, IBS, dementia     Clinical Impressions Patient was seen for OT evaluation this date. Prior to hospital admission, patient was requiring A from personal care aid services for ADLs (per patient report) and also working with home health. Patient lives in a single story home with daughter who provides good  support. Patient has been hospitalized due to L foot cellulitis and AMS.  Patient presents with deficits in activity tolerance, standing tolerance, gross strength and attention to task, affecting safe and optimal ADL completion. Patient is currently requiring mod-max A for basic ADLs. During OT eval, she required mod A to transition from semi fowlers to EOB, reports she sleeps in hospital bed at home so [facility] raised. Patient perfored sit<>stand x 3 trials with min A for lifting. Patient with episode of urinary incontinence during OT eval and required max A for pericare and LB dressing. After 4th sit<>stand patient reports my legs feel week. Patient reports she is going home today which is appropriate due to level of assist that she has at home.  Paient would benefit from skilled OT services to address noted impairments and functional limitations (see below for any additional details) in order to maximize safety and independence while minimizing future risk of falls, injury, and readmission. Do  anticipate the need for follow up OT services upon acute hospital DC.      If plan is discharge home, recommend the following:   A little help with walking and/or transfers;A lot of help with bathing/dressing/bathroom;Assistance with cooking/housework;Direct  supervision/assist for medications management;Direct supervision/assist for financial management;Assist for transportation     Functional Status Assessment   Patient has had a recent decline in their functional status and demonstrates the ability to make significant improvements in function in a reasonable and predictable amount of time.     Equipment Recommendations   Other (comment)     Recommendations for Other Services         Precautions/Restrictions   Precautions Precautions: Fall Recall of Precautions/Restrictions: Impaired Restrictions Weight Bearing Restrictions Per Provider Order: No     Mobility Bed Mobility Overal bed mobility: Needs Assistance Bed Mobility: Supine to Sit     Supine to sit: Mod assist, HOB elevated, Used rails     General bed mobility comments: mod A to scoot to EOB    Transfers Overall transfer level: Needs assistance Equipment used: Rolling walker (2 wheels) Transfers: Sit to/from Stand, Bed to chair/wheelchair/BSC Sit to Stand: Mod assist     Step pivot transfers: Mod assist     General transfer comment: required min A for lifting and lowering for all transfers      Balance Overall balance assessment: Needs assistance Sitting-balance support: Feet supported Sitting balance-Leahy Scale: Fair   Postural control: Left lateral lean Standing balance support: Bilateral upper extremity supported, During functional activity, Reliant on assistive device for balance Standing balance-Leahy Scale: Fair                             ADL either performed or assessed with clinical judgement   ADL Overall ADL's : Needs assistance/impaired Eating/Feeding: Set up  Grooming: Minimal assistance   Upper Body Bathing: Moderate assistance   Lower Body Bathing: Maximal assistance;Sit to/from stand   Upper Body Dressing : Moderate assistance   Lower Body Dressing: Maximal assistance;Sit to/from stand   Toilet Transfer:  Moderate assistance;BSC/3in1   Toileting- Clothing Manipulation and Hygiene: Maximal assistance;Sit to/from stand       Functional mobility during ADLs: Moderate assistance General ADL Comments: patient requires A with all ADLs due to limited standing tolerance/balance deficits     Vision         Perception         Praxis         Pertinent Vitals/Pain Pain Assessment Pain Assessment: No/denies pain     Extremity/Trunk Assessment Upper Extremity Assessment Upper Extremity Assessment: Generalized weakness   Lower Extremity Assessment Lower Extremity Assessment: Generalized weakness   Cervical / Trunk Assessment Cervical / Trunk Assessment: Kyphotic   Communication Communication Communication: Impaired Factors Affecting Communication: Hearing impaired   Cognition Arousal: Alert Behavior During Therapy: WFL for tasks assessed/performed Cognition: History of cognitive impairments             OT - Cognition Comments: per chart review, hx of dementia and combative behavior, no combative behavior present during OT                 Following commands: Impaired Following commands impaired: Follows one step commands with increased time     Cueing  General Comments   Cueing Techniques: Verbal cues;Gestural cues;Tactile cues;Visual cues  incontinent during OT eval   Exercises     Shoulder Instructions      Home Living Family/patient expects to be discharged to:: Private residence Living Arrangements: Children Available Help at Discharge: Personal care attendant;Family;Available 24 hours/day Type of Home: House Home Access: Ramped entrance     Home Layout: One level     Bathroom Shower/Tub: Producer, television/film/video: Handicapped height Bathroom Accessibility: Yes   Home Equipment: Shower seat;Hand held shower head;Grab bars - tub/shower;Grab bars - toilet;Rolling Walker (2 wheels);Hospital bed;Wheelchair - manual;BSC/3in1   Additional  Comments: some information obtained from previous chart entry, granddaughter confirmed house is 1 level with ramped entry & pt has PCA 9am-5pm Monday-Friday with daughter/granddaughter present to assist the other times. Reports house is not w/c accessible. (per PT charting)      Prior Functioning/Environment Prior Level of Function : Needs assist       Physical Assist : Mobility (physical) Mobility (physical): Bed mobility;Transfers;Gait;Stairs   Mobility Comments: Pt reports she was ambulating with RW & 1 assist. ADLs Comments: Assisted by family    OT Problem List: Decreased strength;Decreased range of motion;Decreased activity tolerance;Impaired balance (sitting and/or standing);Decreased safety awareness;Decreased knowledge of use of DME or AE;Pain   OT Treatment/Interventions: Self-care/ADL training;Therapeutic exercise;Energy conservation;DME and/or AE instruction;Therapeutic activities;Patient/family education;Balance training      OT Goals(Current goals can be found in the care plan section)   Acute Rehab OT Goals Patient Stated Goal: I want to go home OT Goal Formulation: With patient Time For Goal Achievement: 10/30/23 Potential to Achieve Goals: Fair ADL Goals Pt Will Perform Grooming: with min assist;sitting;standing Pt Will Perform Upper Body Dressing: with min assist;sitting Pt Will Transfer to Toilet: with supervision;stand pivot transfer;ambulating;grab bars;regular height toilet   OT Frequency:  Min 2X/week    Co-evaluation              AM-PAC OT 6 Clicks Daily Activity     Outcome Measure Help from  another person eating meals?: A Little Help from another person taking care of personal grooming?: A Lot Help from another person toileting, which includes using toliet, bedpan, or urinal?: A Lot Help from another person bathing (including washing, rinsing, drying)?: A Lot Help from another person to put on and taking off regular upper body clothing?: A  Lot Help from another person to put on and taking off regular lower body clothing?: A Lot 6 Click Score: 13   End of Session Equipment Utilized During Treatment: Gait belt;Rolling walker (2 wheels) Nurse Communication: Other (comment) (requesting assist for feeding)  Activity Tolerance: Patient limited by fatigue Patient left: in chair;with call bell/phone within reach;with chair alarm set  OT Visit Diagnosis: Unsteadiness on feet (R26.81);Other abnormalities of gait and mobility (R26.89);History of falling (Z91.81)                Time: 9245-9182 OT Time Calculation (min): 23 min Charges:  OT General Charges $OT Visit: 1 Visit OT Evaluation $OT Eval Low Complexity: 1 Low OT Treatments $Self Care/Home Management : 8-22 mins  Rogers Clause, OT/L MSOT, 10/16/2023

## 2023-10-16 NOTE — Discharge Summary (Signed)
 Physician Discharge Summary   Patient: [name] MRN: [id number] DOB: August 06, 1934  Admit date:     10/14/2023  Discharge date: 10/16/23  Discharge Physician: Drue ONEIDA Potter   PCP: [name], MD   Recommendations at discharge:  Follow-up with PCP  Discharge Diagnoses: Principal Problem:   Cellulitis of left foot Active Problems:   Acute metabolic encephalopathy   Chronic diastolic CHF (congestive heart failure) (HCC)   COPD (chronic obstructive pulmonary disease) (HCC)   Dementia (HCC)  Resolved Problems:   * No resolved hospital problems. [facility] Course:  From HPI [name] is a 89 y.o. female with medical history significant of anxiety, CHF, COPD, GERD, hyperlipidemia, pulmonary fibrosis being admitted with concern for cellulitis left foot and altered mental status.  No family available at bedside.  According to ED provider family who is currently having going to have orthopedic surgery was unable to manage patient due to increased combativeness in the home. In the ED, vitals within normal limits Blood work unremarkable except for mild anemia with hemoglobin 10.4.  Creatinine 1.35 which is near baseline, respiratory viral panel and UA unremarkable. CT head unremarkable, chest x-ray shows chronic interstitial lung disease and a large hiatal hernia and left foot x-ray showing chronic changes without acute abnormality Patient treated with ceftriaxone  Admission requested    Assessment and Plan:    Cellulitis of left foot IV antibiotics switched to oral as patient is going home today   Acute metabolic encephalopathy-resolved Baseline dementia Caregiver unable to care for patient Reported combativeness at home--possible delirium Patient appeared reasonable at the time of assessment And neurologic checks with fall precautions Delirium precautions TOC consult   Chronic diastolic CHF (congestive heart failure) (HCC) Clinically euvolemic.  Holding  furosemide    COPD (chronic obstructive pulmonary disease) (HCC) Not acutely exacerbated DuoNebs as needed   Chronic swelling behind the left ear Chronic so outpatient follow-up   Consultants: PT OT Procedures performed: None Disposition: Home health Diet recommendation:  Cardiac diet DISCHARGE MEDICATION: Allergies as of 10/16/2023       Reactions   Codeine Anaphylaxis, Shortness Of Breath, Rash   Neurontin [gabapentin] Anaphylaxis   Sulfonamide Derivatives Hives, Shortness Of Breath   Aloe Other (See Comments)   Unknown reaction   Levaquin [levofloxacin] Other (See Comments)   Pt admitted to hospital with lung problems due to this medication.   Ms Contin  [morphine ] Other (See Comments)   Patient's daughter reports hypoxia requiring O2 with morphine  and similar opiates.  But does okay with tramadol .   Septra [sulfamethoxazole-trimethoprim] Hives   Sulfa Antibiotics Hives   Trimethoprim Hives   Vibra -tab [doxycycline ] Hives        Medication List     STOP taking these medications    cephALEXin  500 MG capsule Commonly known as: KEFLEX    loratadine  10 MG tablet Commonly known as: CLARITIN        TAKE these medications    acetaminophen  500 MG tablet Commonly known as: TYLENOL  Take 1 tablet (500 mg total) by mouth every 8 (eight) hours as needed. What changed: reasons to take this   albuterol  108 (90 Base) MCG/ACT inhaler Commonly known as: ProAir  HFA Inhale 2 puffs into the lungs every 4 (four) hours as needed for wheezing or shortness of breath. What changed:  how much to take how to take this when to take this   ALPRAZolam  0.5 MG tablet Commonly known as: XANAX  Take 1 tablet (0.5 mg total) by mouth 2 (  two) times daily.   amoxicillin -clavulanate 875-125 MG tablet Commonly known as: AUGMENTIN  Take 1 tablet by mouth 2 (two) times daily for 7 days.   Breo Ellipta  100-25 MCG/ACT Aepb Generic drug: fluticasone  furoate-vilanterol Inhale 1 puff into the  lungs daily.   CRANBERRY PO Take 1 tablet by mouth daily.   diclofenac  Sodium 1 % Gel Commonly known as: VOLTAREN  APPLY AS DIRECTED TO THE AFFECTED KNEE FOUR TIMES DAILY.   donepezil  10 MG tablet Commonly known as: ARICEPT  Take 10 mg by mouth at bedtime.   furosemide  40 MG tablet Commonly known as: LASIX  Take 1 tablet (40 mg total) by mouth daily. With an extra 40 Mg tablet as needed for swelling, shortness of breath and weight gain.   ipratropium-albuterol  0.5-2.5 (3) MG/3ML Soln Commonly known as: DUONEB Take 3 mLs by nebulization in the morning, at noon, in the evening, and at bedtime.   Linzess  145 MCG Caps capsule Generic drug: linaclotide  Take 145 mcg by mouth daily as needed (constipation).   memantine  10 MG tablet Commonly known as: NAMENDA  Take 10 mg by mouth 2 (two) times daily.   midodrine  5 MG tablet Commonly known as: PROAMATINE  Take 1 tablet (5 mg total) by mouth 2 (two) times daily with a meal.   ONE A DAY WOMEN 50 PLUS PO Take 1 tablet by mouth daily.   pantoprazole  40 MG tablet Commonly known as: PROTONIX  TAKE (1) TABLET BY MOUTH TWICE A DAY WITH MEALS (BREAKFAST AND SUPPER)   potassium chloride  10 MEQ tablet Commonly known as: KLOR-CON  Take 1 tablet (10 mEq total) by mouth daily. When taking lasix .   simvastatin  10 MG tablet Commonly known as: ZOCOR  Take 10 mg by mouth at bedtime.   traMADol  50 MG tablet Commonly known as: ULTRAM  Take 1 tablet (50 mg total) by mouth every 6 (six) hours as needed for moderate pain or severe pain. What changed: when to take this   traZODone  50 MG tablet Commonly known as: DESYREL  Take 1 tablet by mouth at bedtime as needed for sleep.   zolpidem  5 MG tablet Commonly known as: AMBIEN  Take 1 tablet (5 mg total) by mouth at bedtime.        Discharge Exam: Filed Weights   10/15/23 0300  Weight: 53.4 kg   HENT:     Head: Normocephalic and atraumatic.     Comments: Cyst on neck(present for many  years)-see pic Cardiovascular:     Rate and Rhythm: Normal rate and regular rhythm.     Heart sounds: Normal heart sounds.  Pulmonary:     Effort: Pulmonary effort is normal.     Breath sounds: Normal breath sounds.  Abdominal:     Palpations: Abdomen is soft.     Tenderness: There is no abdominal tenderness.  Musculoskeletal: Erythema in the lower extremity improved Neurological:     Mental Status: Mental status is at baseline.  Condition at discharge: good  The results of significant diagnostics from this hospitalization (including imaging, microbiology, ancillary and laboratory) are listed below for reference.   Imaging Studies: CT HEAD WO CONTRAST ( ) Result Date: 10/14/2023 CLINICAL DATA:  Altered mental status EXAM: CT HEAD WITHOUT CONTRAST TECHNIQUE: Contiguous axial images were obtained from the base of the skull through the vertex without intravenous contrast. RADIATION DOSE REDUCTION: This exam was performed according to the departmental dose-optimization program which includes automated exposure control, adjustment of the mA and/or kV according to patient size and/or use of iterative reconstruction technique. COMPARISON:  05/29/2023  FINDINGS: Brain: No evidence of acute infarction, hemorrhage, hydrocephalus, extra-axial collection or mass lesion/mass effect. Mild atrophic changes and chronic white matter ischemic changes are noted commenced with the patient's given age. Vascular: No hyperdense vessel or unexpected calcification. Skull: Normal. Negative for fracture or focal lesion. Sinuses/Orbits: No acute finding. Other: Stable subcutaneous mass lesion is noted in the left posterior neck adjacent to the craniocervical junction. This is similar to that seen on prior exam IMPRESSION: Chronic atrophic and ischemic changes without acute abnormality. Stable soft tissue mass in the left posterior neck near the craniocervical junction. This is been present over multiple previous exams.  Electronically Signed   By: Oneil Devonshire M.D.   On: 10/14/2023 23:20   DG Chest Portable 1 View Result Date: 10/14/2023 CLINICAL DATA:  Shortness of breath EXAM: PORTABLE CHEST 1 VIEW COMPARISON:  05/30/2023 FINDINGS: Large hiatal hernia. Heart is normal size. Aortic atherosclerosis. Chronic interstitial prominence throughout the lungs compatible with chronic lung disease/fibrosis. No acute confluent airspace opacities. No acute bony abnormality. Multiple old bilateral rib fractures. IMPRESSION: Chronic interstitial lung disease. Large hiatal hernia. No acute cardiopulmonary disease. Electronically Signed   By: Franky Crease M.D.   On: 10/14/2023 22:42   DG Foot Complete Left Result Date: 10/14/2023 CLINICAL DATA:  Left foot pain, no known injury, initial encounter EXAM: LEFT FOOT - COMPLETE 3+ VIEW COMPARISON:  03/02/2022 FINDINGS: Hallux valgus deformity is again noted and stable. No acute fracture or dislocation is noted. Mild osteopenia is seen. No acute fracture is noted. Tarsal degenerative changes are seen. IMPRESSION: Chronic changes without acute abnormality. Electronically Signed   By: Oneil Devonshire M.D.   On: 10/14/2023 22:42    Microbiology: Results for orders placed or performed during the hospital encounter of 10/14/23  Resp panel by RT-PCR (RSV, Flu A&B, Covid) Anterior Nasal Swab     Status: None   Collection Time: 10/14/23 10:20 PM   Specimen: Anterior Nasal Swab  Result Value Ref Range Status   SARS Coronavirus 2 by RT PCR NEGATIVE NEGATIVE Final    Comment: (NOTE) SARS-CoV-2 target nucleic acids are NOT DETECTED.  The SARS-CoV-2 RNA is generally detectable in upper respiratory specimens during the acute phase of infection. The lowest concentration of SARS-CoV-2 viral copies this assay can detect is 138 copies/mL. A negative result does not preclude SARS-Cov-2 infection and should not be used as the sole basis for treatment or other patient management decisions. A negative  result may occur with  improper specimen collection/handling, submission of specimen other than nasopharyngeal swab, presence of viral mutation(s) within the areas targeted by this assay, and inadequate number of viral copies(<138 copies/mL). A negative result must be combined with clinical observations, patient history, and epidemiological information. The expected result is Negative.  Fact Sheet for Patients:  BloggerCourse.com  Fact Sheet for Healthcare Providers:  SeriousBroker.it  This test is no t yet approved or cleared by the [place]  FDA and  has been authorized for detection and/or diagnosis of SARS-CoV-2 by FDA under an Emergency Use Authorization (EUA). This EUA will remain  in effect (meaning this test can be used) for the duration of the COVID-19 declaration under Section 564(b)(1) of the Act, 21 U.S.C.section 360bbb-3(b)(1), unless the authorization is terminated  or revoked sooner.       Influenza A by PCR NEGATIVE NEGATIVE Final   Influenza B by PCR NEGATIVE NEGATIVE Final    Comment: (NOTE) The Xpert Xpress SARS-CoV-2/FLU/RSV plus assay is intended as an aid in the  diagnosis of influenza from Nasopharyngeal swab specimens and should not be used as a sole basis for treatment. Nasal washings and aspirates are unacceptable for Xpert Xpress SARS-CoV-2/FLU/RSV testing.  Fact Sheet for Patients: BloggerCourse.com  Fact Sheet for Healthcare Providers: SeriousBroker.it  This test is not yet approved or cleared by the [place]  FDA and has been authorized for detection and/or diagnosis of SARS-CoV-2 by FDA under an Emergency Use Authorization (EUA). This EUA will remain in effect (meaning this test can be used) for the duration of the COVID-19 declaration under Section 564(b)(1) of the Act, 21 U.S.C. section 360bbb-3(b)(1), unless the authorization is  terminated or revoked.     Resp Syncytial Virus by PCR NEGATIVE NEGATIVE Final    Comment: (NOTE) Fact Sheet for Patients: BloggerCourse.com  Fact Sheet for Healthcare Providers: SeriousBroker.it  This test is not yet approved or cleared by the [place]  FDA and has been authorized for detection and/or diagnosis of SARS-CoV-2 by FDA under an Emergency Use Authorization (EUA). This EUA will remain in effect (meaning this test can be used) for the duration of the COVID-19 declaration under Section 564(b)(1) of the Act, 21 U.S.C. section 360bbb-3(b)(1), unless the authorization is terminated or revoked.  Performed at [facility], [address] [place], [place] [address]     Labs: CBC: Recent Labs  Lab 10/14/23 2140 10/16/23 0431  WBC 8.4 4.9  NEUTROABS 5.7 3.0  HGB 10.7* 10.6*  HCT 34.2* 33.4*  MCV 90.7 89.3  PLT 211 198   Basic Metabolic Panel: Recent Labs  Lab 10/14/23 2140 10/16/23 0431  NA 139 140  K 5.2* 4.1  CL 98 104  CO2 25 27  GLUCOSE 111* 96  BUN 31* 16  CREATININE 1.35* 1.12*  CALCIUM  9.4 9.1   Liver Function Tests: Recent Labs  Lab 10/14/23 2140  AST 31  ALT 20  ALKPHOS 95  BILITOT 0.5  PROT 7.3  ALBUMIN  3.9   CBG: No results for input(s): GLUCAP in the last 168 hours.  Discharge time spent:  37 minutes.  Signed: Drue ONEIDA Potter, MD [facility] 10/16/2023

## 2023-10-20 ENCOUNTER — Other Ambulatory Visit: Payer: Self-pay

## 2023-10-20 ENCOUNTER — Emergency Department (HOSPITAL_COMMUNITY)
Admission: EM | Admit: 2023-10-20 | Discharge: 2023-10-20 | Disposition: A | Discharge status: Home / Self Care | Attending: Emergency Medicine | Admitting: Emergency Medicine

## 2023-10-20 ENCOUNTER — Emergency Department (HOSPITAL_COMMUNITY)

## 2023-10-20 ENCOUNTER — Encounter (HOSPITAL_COMMUNITY): Payer: Self-pay

## 2023-10-20 DIAGNOSIS — J441 Chronic obstructive pulmonary disease with (acute) exacerbation: Secondary | ICD-10-CM | POA: Insufficient documentation

## 2023-10-20 DIAGNOSIS — Z7951 Long term (current) use of inhaled steroids: Secondary | ICD-10-CM | POA: Diagnosis not present

## 2023-10-20 DIAGNOSIS — R0602 Shortness of breath: Secondary | ICD-10-CM | POA: Diagnosis present

## 2023-10-20 LAB — CBC WITH DIFFERENTIAL/PLATELET
Abs Immature Granulocytes: 0.01 K/uL (ref 0.00–0.07)
Basophils Absolute: 0.1 K/uL (ref 0.0–0.1)
Basophils Relative: 1 %
Eosinophils Absolute: 0.2 K/uL (ref 0.0–0.5)
Eosinophils Relative: 4 %
HCT: 32.4 % — ABNORMAL LOW (ref 36.0–46.0)
Hemoglobin: 10.2 g/dL — ABNORMAL LOW (ref 12.0–15.0)
Immature Granulocytes: 0 %
Lymphocytes Relative: 19 %
Lymphs Abs: 1.1 K/uL (ref 0.7–4.0)
MCH: 28.7 pg (ref 26.0–34.0)
MCHC: 31.5 g/dL (ref 30.0–36.0)
MCV: 91 fL (ref 80.0–100.0)
Monocytes Absolute: 0.8 K/uL (ref 0.1–1.0)
Monocytes Relative: 12 %
Neutro Abs: 3.9 K/uL (ref 1.7–7.7)
Neutrophils Relative %: 64 %
Platelets: 176 K/uL (ref 150–400)
RBC: 3.56 MIL/uL — ABNORMAL LOW (ref 3.87–5.11)
RDW: 13.9 % (ref 11.5–15.5)
WBC: 6.1 K/uL (ref 4.0–10.5)
nRBC: 0 % (ref 0.0–0.2)

## 2023-10-20 LAB — COMPREHENSIVE METABOLIC PANEL WITH GFR
ALT: 16 U/L (ref 0–44)
AST: 26 U/L (ref 15–41)
Albumin: 3.9 g/dL (ref 3.5–5.0)
Alkaline Phosphatase: 102 U/L (ref 38–126)
Anion gap: 14 (ref 5–15)
BUN: 19 mg/dL (ref 8–23)
CO2: 23 mmol/L (ref 22–32)
Calcium: 8.9 mg/dL (ref 8.9–10.3)
Chloride: 101 mmol/L (ref 98–111)
Creatinine, Ser: 1.13 mg/dL — ABNORMAL HIGH (ref 0.44–1.00)
GFR, Estimated: 47 mL/min — ABNORMAL LOW (ref >60)
Glucose, Bld: 106 mg/dL — ABNORMAL HIGH (ref 70–99)
Potassium: 4.8 mmol/L (ref 3.5–5.1)
Sodium: 139 mmol/L (ref 135–145)
Total Bilirubin: <0.2 mg/dL (ref 0.0–1.2)
Total Protein: 6.8 g/dL (ref 6.5–8.1)

## 2023-10-20 LAB — TROPONIN T, HIGH SENSITIVITY
Troponin T High Sensitivity: 20 ng/L — ABNORMAL HIGH (ref 0–19)
Troponin T High Sensitivity: 19 ng/L (ref 0–19)

## 2023-10-20 LAB — PRO BRAIN NATRIURETIC PEPTIDE: Pro Brain Natriuretic Peptide: 457 pg/mL — ABNORMAL HIGH (ref <300.0)

## 2023-10-20 LAB — RESP PANEL BY RT-PCR (RSV, FLU A&B, COVID)  RVPGX2
Influenza A by PCR: NEGATIVE
Influenza B by PCR: NEGATIVE
Resp Syncytial Virus by PCR: NEGATIVE
SARS Coronavirus 2 by RT PCR: NEGATIVE

## 2023-10-20 NOTE — ED Notes (Signed)
 PT;s brief has been changed by myself and Tori NT, PT's brief was soaked all the way through to the sheets and PT stated that she has been asking to be changed but no one ever came in to change her.   Jacki Pereyra

## 2023-10-20 NOTE — ED Triage Notes (Signed)
 Pt came in EMS [name] with complaints of SHOB, Pt is stating at 97% RA at this time. Pt was recently discharged from [facility] hospital for cellulitis in the leg on amoxicillin .

## 2023-10-20 NOTE — ED Notes (Signed)
 Family at bedside.

## 2023-10-20 NOTE — ED Provider Notes (Signed)
 [place] EMERGENCY DEPARTMENT AT [facility] Provider Note   CSN: [id number] Arrival date & time: 10/20/23  1400     Patient presents with: Shortness of Breath   [name] is a 89 y.o. female.  She is brought in by ambulance.  She does not know why she is here.  She says she feels at baseline.  Per EMS that she was sent in for shortness of breath.  Patient states she was recently at [facility] for an infection and she finished the antibiotics.  She said she does not walk.  She denies any fevers chills cough shortness of breath vomiting diarrhea or urinary symptoms.  Looks like she was treated with Augmentin  for cellulitis and had some element of encephalopathy during her admission.  She said she does not normally use oxygen.    Shortness of Breath Severity:  Unable to specify Onset quality:  Unable to specify      Prior to Admission medications   Medication Sig Start Date End Date Taking? Authorizing Provider  acetaminophen  (TYLENOL ) 500 MG tablet Take 1 tablet (500 mg total) by mouth every 8 (eight) hours as needed. Patient taking differently: Take 500 mg by mouth every 8 (eight) hours as needed for mild pain (pain score 1-3) or moderate pain (pain score 4-6). 08/26/22   Patsey Lot, MD  albuterol  (PROAIR  HFA) 108 (90 Base) MCG/ACT inhaler Inhale 2 puffs into the lungs every 4 (four) hours as needed for wheezing or shortness of breath. Patient taking differently: No sig reported 06/12/21   Vicci Afton LITTIE, MD  ALPRAZolam  (XANAX ) 0.5 MG tablet Take 1 tablet (0.5 mg total) by mouth 2 (two) times daily. 07/29/22   Evonnie Lenis, MD  amoxicillin -clavulanate (AUGMENTIN ) 875-125 MG tablet Take 1 tablet by mouth 2 (two) times daily for 7 days. 10/15/23 10/22/23  Dorinda Drue DASEN, MD  CRANBERRY PO Take 1 tablet by mouth daily.    [provider]  diclofenac  Sodium (VOLTAREN ) 1 % GEL APPLY AS DIRECTED TO THE AFFECTED KNEE FOUR TIMES DAILY. 08/04/20   Margrette Taft BRAVO,  MD  donepezil  (ARICEPT ) 10 MG tablet Take 10 mg by mouth at bedtime. 07/25/19   [provider]  fluticasone  furoate-vilanterol (BREO ELLIPTA ) 100-25 MCG/ACT AEPB Inhale 1 puff into the lungs daily.    [provider]  furosemide  (LASIX ) 40 MG tablet Take 1 tablet (40 mg total) by mouth daily. With an extra 40 Mg tablet as needed for swelling, shortness of breath and weight gain. 04/29/23   Miriam Norris, NP  ipratropium-albuterol  (DUONEB) 0.5-2.5 (3) MG/3ML SOLN Take 3 mLs by nebulization in the morning, at noon, in the evening, and at bedtime.    [provider]  LINZESS  145 MCG CAPS capsule Take 145 mcg by mouth daily as needed (constipation). 05/22/21   [provider]  memantine  (NAMENDA ) 10 MG tablet Take 10 mg by mouth 2 (two) times daily.    [provider]  midodrine  (PROAMATINE ) 5 MG tablet Take 1 tablet (5 mg total) by mouth 2 (two) times daily with a meal. 01/21/23   Debera Jayson MATSU, MD  Multiple Vitamins-Minerals (ONE A DAY WOMEN 50 PLUS PO) Take 1 tablet by mouth daily.    [provider]  pantoprazole  (PROTONIX ) 40 MG tablet TAKE (1) TABLET BY MOUTH TWICE A DAY WITH MEALS (BREAKFAST AND SUPPER) 05/24/19   Marvis Camellia LABOR, NP  potassium chloride  (KLOR-CON ) 10 MEQ tablet Take 1 tablet (10 mEq total) by mouth daily.  When taking lasix . 11/30/22   Ricky Fines, MD  simvastatin  (ZOCOR ) 10 MG tablet Take 10 mg by mouth at bedtime. 02/27/21   [provider]  traMADol  (ULTRAM ) 50 MG tablet Take 1 tablet (50 mg total) by mouth every 6 (six) hours as needed for moderate pain or severe pain. Patient taking differently: Take 50 mg by mouth every 4 (four) hours. 07/29/22   Evonnie Lenis, MD  traZODone  (DESYREL ) 50 MG tablet Take 1 tablet by mouth at bedtime as needed for sleep. Patient not taking: Reported on 10/15/2023 05/02/23 05/01/24  [provider]  zolpidem  (AMBIEN ) 5 MG tablet Take 1 tablet (5 mg total) by mouth at bedtime.  07/29/22   Evonnie Lenis, MD    Allergies: Codeine, Neurontin [gabapentin], Sulfonamide derivatives, Aloe, Levaquin [levofloxacin], Ms contin  [morphine ], Septra [sulfamethoxazole-trimethoprim], Sulfa antibiotics, Trimethoprim, and Vibra -tab [doxycycline ]    Review of Systems  Respiratory:  Positive for shortness of breath.     Updated Vital Signs BP 117/76 (BP Location: Right Arm)   Pulse 80   Temp 97.8 F (36.6 C) (Oral)   Resp 18   Ht 5' 1 (1.549 m)   Wt 53.4 kg   SpO2 97%   BMI 22.24 kg/m   Physical Exam Vitals and nursing note reviewed.  Constitutional:      General: She is not in acute distress.    Appearance: Normal appearance. She is well-developed.  HENT:     Head: Normocephalic and atraumatic.  Eyes:     Conjunctiva/sclera: Conjunctivae normal.  Cardiovascular:     Rate and Rhythm: Normal rate and regular rhythm.     Heart sounds: No murmur heard. Pulmonary:     Effort: Pulmonary effort is normal. No respiratory distress.     Breath sounds: No stridor. Examination of the right-middle field reveals rhonchi. Examination of the right-lower field reveals rhonchi. Rhonchi present. No wheezing.  Abdominal:     Palpations: Abdomen is soft.     Tenderness: There is no abdominal tenderness. There is no guarding or rebound.  Musculoskeletal:        General: No tenderness or deformity.     Cervical back: Neck supple.     Right lower leg: No tenderness. No edema.     Left lower leg: No tenderness. No edema.  Skin:    General: Skin is warm and dry.  Neurological:     General: No focal deficit present.     Mental Status: She is alert.     GCS: GCS eye subscore is 4. GCS verbal subscore is 5. GCS motor subscore is 6.     (all labs ordered are listed, but only abnormal results are displayed) Labs Reviewed  COMPREHENSIVE METABOLIC PANEL WITH GFR - Abnormal; Notable for the following components:      Result Value   Glucose, Bld 106 (*)    Creatinine, Ser 1.13 (*)     GFR, Estimated 47 (*)    All other components within normal limits  PRO BRAIN NATRIURETIC PEPTIDE - Abnormal; Notable for the following components:   Pro Brain Natriuretic Peptide 457.0 (*)    All other components within normal limits  CBC WITH DIFFERENTIAL/PLATELET - Abnormal; Notable for the following components:   RBC 3.56 (*)    Hemoglobin 10.2 (*)    HCT 32.4 (*)    All other components within normal limits  TROPONIN T, HIGH SENSITIVITY - Abnormal; Notable for the following components:   Troponin T High Sensitivity 20 (*)  All other components within normal limits  RESP PANEL BY RT-PCR (RSV, FLU A&B, COVID)  [id number]  TROPONIN T, HIGH SENSITIVITY    EKG: EKG Interpretation Date/Time:  Thursday October 20 2023 15:51:23 EDT Ventricular Rate:  77 PR Interval:  247 QRS Duration:  116 QT Interval:  407 QTC Calculation: 461 R Axis:   82  Text Interpretation: Sinus rhythm Prolonged PR interval Nonspecific intraventricular conduction delay ST elevation, consider inferior injury No significant change since prior 5/25 Confirmed by Towana Sharper [phone]) on 10/20/2023 3:53:43 PM  Radiology: [facility] Chest Port 1 View Result Date: 10/20/2023 EXAM: 1 VIEW XRAY OF THE CHEST 10/20/2023 03:36:00 PM COMPARISON: 10/14/2023 CLINICAL HISTORY: sob. Pt came in EMS [place] with complaints of SHOB, Pt is stating at 97% RA at this time. Pt was recently discharged from [facility] hospital for cellulitis in the leg on amoxicillin . Hx of CHF, COPD, pneumonia. Former smoker FINDINGS: LUNGS AND PLEURA: Grossly stable diffuse interstitial densities most consistent with scarring. No pleural effusion. No pneumothorax. HEART AND MEDIASTINUM: Stable cardiomediastinal silhouette. Stable hiatal hernia. BONES AND SOFT TISSUES: Old left rib fracture is noted. IMPRESSION: 1. Stable diffuse interstitial densities, most consistent with scarring. Electronically signed by: Lynwood Seip MD 10/20/2023 04:01 PM EDT RP Workstation:  [id number]     Procedures   Medications Ordered in the ED - No data to display  Clinical Course as of 10/21/23 0919  Thu Oct 20, 2023  1553 Chest x-ray showing worsening fibrotic changes.  Awaiting radiology reading. [MB]  8082 Reviewed results with patient and family.  They said the visiting nurse came by and she had a lot of wheezing despite her breathing treatments.  Patient endorses no shortness of breath now.  She has chronic interstitial lung disease along with COPD and CHF. [MB]    Clinical Course User Index [MB] Towana Sharper BROCKS, MD                                 Medical Decision Making Amount and/or Complexity of Data Reviewed Labs: ordered. Radiology: ordered.   This patient complains of possible shortness of breath; this involves an extensive number of treatment Options and is a complaint that carries with it a high risk of complications and morbidity. The differential includes pneumonia, bronchitis, COVID, flu, CHF, COPD  I ordered, reviewed and interpreted labs, which included CBC with chronically low hemoglobin, chemistries with mildly elevated creatinine, troponins flat, BNP mildly elevated, COVID and flu negative I ordered imaging studies which included chest x-ray and I independently    visualized and interpreted imaging which showed chronic scarring Additional history obtained from patient's family members Previous records obtained and reviewed in epic including recent discharge summary Cardiac monitoring reviewed, sinus rhythm Social determinants considered, tobacco use Critical Interventions: None  After the interventions stated above, I reevaluated the patient and found patient to be breathing comfortably satting well on room air in no distress Admission and further testing considered, no indications for admission at this time.  Recommended close follow-up with PCP.  Return instructions discussed.      Final diagnoses:  COPD exacerbation [facility])     ED Discharge Orders     None          Towana Sharper BROCKS, MD 10/21/23 [phone]

## 2023-10-20 NOTE — Discharge Instructions (Addendum)
 You were seen in the emergency department for increased shortness of breath.  You had lab work chest x-ray EKG that did not show any significant change from your prior tests.  Please continue your inhaler and follow-up with your primary care doctor.  Return to the emergency department if any worsening or concerning symptoms.

## 2023-11-01 ENCOUNTER — Ambulatory Visit: Attending: Nurse Practitioner | Admitting: Nurse Practitioner

## 2023-11-02 ENCOUNTER — Encounter: Payer: Self-pay | Admitting: Cardiology

## 2023-11-03 NOTE — Telephone Encounter (Signed)
 Spoke w/[name] [name]. I let her know they were given the appt in [facility] because it was Dr. Madalyn first available appt. [name] did not want to move the appt to [place]. She asked to keep the appt in [place] since it's the first available.

## 2023-11-10 ENCOUNTER — Emergency Department (HOSPITAL_COMMUNITY)

## 2023-11-10 ENCOUNTER — Encounter (HOSPITAL_COMMUNITY): Payer: Self-pay

## 2023-11-10 ENCOUNTER — Other Ambulatory Visit: Payer: Self-pay

## 2023-11-10 ENCOUNTER — Observation Stay (HOSPITAL_COMMUNITY)
Admission: EM | Admit: 2023-11-10 | Discharge: 2023-11-13 | Disposition: A | Discharge status: Home / Self Care | Attending: Internal Medicine | Admitting: Internal Medicine

## 2023-11-10 DIAGNOSIS — M7989 Other specified soft tissue disorders: Secondary | ICD-10-CM | POA: Diagnosis present

## 2023-11-10 DIAGNOSIS — I5032 Chronic diastolic (congestive) heart failure: Secondary | ICD-10-CM | POA: Insufficient documentation

## 2023-11-10 DIAGNOSIS — M79671 Pain in right foot: Secondary | ICD-10-CM

## 2023-11-10 DIAGNOSIS — G4700 Insomnia, unspecified: Secondary | ICD-10-CM | POA: Diagnosis not present

## 2023-11-10 DIAGNOSIS — Z7901 Long term (current) use of anticoagulants: Secondary | ICD-10-CM | POA: Insufficient documentation

## 2023-11-10 DIAGNOSIS — D631 Anemia in chronic kidney disease: Secondary | ICD-10-CM | POA: Diagnosis not present

## 2023-11-10 DIAGNOSIS — Z87891 Personal history of nicotine dependence: Secondary | ICD-10-CM | POA: Diagnosis not present

## 2023-11-10 DIAGNOSIS — N1832 Chronic kidney disease, stage 3b: Secondary | ICD-10-CM | POA: Diagnosis not present

## 2023-11-10 DIAGNOSIS — Z7982 Long term (current) use of aspirin: Secondary | ICD-10-CM | POA: Insufficient documentation

## 2023-11-10 DIAGNOSIS — F03918 Unspecified dementia, unspecified severity, with other behavioral disturbance: Secondary | ICD-10-CM | POA: Diagnosis not present

## 2023-11-10 DIAGNOSIS — I130 Hypertensive heart and chronic kidney disease with heart failure and stage 1 through stage 4 chronic kidney disease, or unspecified chronic kidney disease: Secondary | ICD-10-CM | POA: Diagnosis not present

## 2023-11-10 DIAGNOSIS — R531 Weakness: Secondary | ICD-10-CM | POA: Insufficient documentation

## 2023-11-10 DIAGNOSIS — G8929 Other chronic pain: Secondary | ICD-10-CM | POA: Diagnosis not present

## 2023-11-10 DIAGNOSIS — J449 Chronic obstructive pulmonary disease, unspecified: Secondary | ICD-10-CM | POA: Diagnosis not present

## 2023-11-10 DIAGNOSIS — Z79899 Other long term (current) drug therapy: Secondary | ICD-10-CM | POA: Insufficient documentation

## 2023-11-10 DIAGNOSIS — F411 Generalized anxiety disorder: Secondary | ICD-10-CM | POA: Diagnosis not present

## 2023-11-10 DIAGNOSIS — E785 Hyperlipidemia, unspecified: Secondary | ICD-10-CM | POA: Diagnosis not present

## 2023-11-10 DIAGNOSIS — R600 Localized edema: Principal | ICD-10-CM

## 2023-11-10 LAB — CBC WITH DIFFERENTIAL/PLATELET
Abs Immature Granulocytes: 0.01 K/uL (ref 0.00–0.07)
Basophils Absolute: 0 K/uL (ref 0.0–0.1)
Basophils Relative: 1 %
Eosinophils Absolute: 0.2 K/uL (ref 0.0–0.5)
Eosinophils Relative: 3 %
HCT: 33.9 % — ABNORMAL LOW (ref 36.0–46.0)
Hemoglobin: 10.7 g/dL — ABNORMAL LOW (ref 12.0–15.0)
Immature Granulocytes: 0 %
Lymphocytes Relative: 17 %
Lymphs Abs: 1.1 K/uL (ref 0.7–4.0)
MCH: 28.7 pg (ref 26.0–34.0)
MCHC: 31.6 g/dL (ref 30.0–36.0)
MCV: 90.9 fL (ref 80.0–100.0)
Monocytes Absolute: 1 K/uL (ref 0.1–1.0)
Monocytes Relative: 16 %
Neutro Abs: 4.2 K/uL (ref 1.7–7.7)
Neutrophils Relative %: 63 %
Platelets: 235 K/uL (ref 150–400)
RBC: 3.73 MIL/uL — ABNORMAL LOW (ref 3.87–5.11)
RDW: 13.5 % (ref 11.5–15.5)
WBC: 6.5 K/uL (ref 4.0–10.5)
nRBC: 0 % (ref 0.0–0.2)

## 2023-11-10 LAB — URINALYSIS, ROUTINE W REFLEX MICROSCOPIC
Bilirubin Urine: NEGATIVE
Glucose, UA: NEGATIVE mg/dL
Hgb urine dipstick: NEGATIVE
Ketones, ur: NEGATIVE mg/dL
Leukocytes,Ua: NEGATIVE
Nitrite: NEGATIVE
Protein, ur: NEGATIVE mg/dL
Specific Gravity, Urine: 1.008 (ref 1.005–1.030)
pH: 5 (ref 5.0–8.0)

## 2023-11-10 LAB — PRO BRAIN NATRIURETIC PEPTIDE: Pro Brain Natriuretic Peptide: 476 pg/mL — ABNORMAL HIGH (ref <300.0)

## 2023-11-10 LAB — COMPREHENSIVE METABOLIC PANEL WITH GFR
ALT: 16 U/L (ref 0–44)
AST: 28 U/L (ref 15–41)
Albumin: 4.2 g/dL (ref 3.5–5.0)
Alkaline Phosphatase: 113 U/L (ref 38–126)
Anion gap: 11 (ref 5–15)
BUN: 29 mg/dL — ABNORMAL HIGH (ref 8–23)
CO2: 28 mmol/L (ref 22–32)
Calcium: 9.5 mg/dL (ref 8.9–10.3)
Chloride: 97 mmol/L — ABNORMAL LOW (ref 98–111)
Creatinine, Ser: 1.47 mg/dL — ABNORMAL HIGH (ref 0.44–1.00)
GFR, Estimated: 34 mL/min — ABNORMAL LOW (ref >60)
Glucose, Bld: 124 mg/dL — ABNORMAL HIGH (ref 70–99)
Potassium: 5.2 mmol/L — ABNORMAL HIGH (ref 3.5–5.1)
Sodium: 136 mmol/L (ref 135–145)
Total Bilirubin: 0.2 mg/dL (ref 0.0–1.2)
Total Protein: 7.3 g/dL (ref 6.5–8.1)

## 2023-11-10 NOTE — ED Triage Notes (Addendum)
 Pt BIB [facility] from home (lives with daughter), has slight hx of dementia. Daughter stated she noticed the pt having ankle and toes swelling and color. Hx of CHF. Slight indentation of swelling with EMS.   93% RA 98 HR 111/68 BP CBG 141

## 2023-11-11 ENCOUNTER — Encounter (HOSPITAL_COMMUNITY): Payer: Self-pay | Admitting: Internal Medicine

## 2023-11-11 ENCOUNTER — Observation Stay (HOSPITAL_COMMUNITY)

## 2023-11-11 DIAGNOSIS — M7989 Other specified soft tissue disorders: Secondary | ICD-10-CM | POA: Diagnosis not present

## 2023-11-11 LAB — GLUCOSE, CAPILLARY: Glucose-Capillary: 110 mg/dL — ABNORMAL HIGH (ref 70–99)

## 2023-11-11 LAB — BASIC METABOLIC PANEL WITH GFR
Anion gap: 11 (ref 5–15)
BUN: 25 mg/dL — ABNORMAL HIGH (ref 8–23)
CO2: 27 mmol/L (ref 22–32)
Calcium: 9.2 mg/dL (ref 8.9–10.3)
Chloride: 99 mmol/L (ref 98–111)
Creatinine, Ser: 1.28 mg/dL — ABNORMAL HIGH (ref 0.44–1.00)
GFR, Estimated: 40 mL/min — ABNORMAL LOW (ref >60)
Glucose, Bld: 100 mg/dL — ABNORMAL HIGH (ref 70–99)
Potassium: 4.5 mmol/L (ref 3.5–5.1)
Sodium: 137 mmol/L (ref 135–145)

## 2023-11-11 LAB — CBC
HCT: 30.7 % — ABNORMAL LOW (ref 36.0–46.0)
Hemoglobin: 9.9 g/dL — ABNORMAL LOW (ref 12.0–15.0)
MCH: 29 pg (ref 26.0–34.0)
MCHC: 32.2 g/dL (ref 30.0–36.0)
MCV: 90 fL (ref 80.0–100.0)
Platelets: 211 K/uL (ref 150–400)
RBC: 3.41 MIL/uL — ABNORMAL LOW (ref 3.87–5.11)
RDW: 13.4 % (ref 11.5–15.5)
WBC: 6.5 K/uL (ref 4.0–10.5)
nRBC: 0 % (ref 0.0–0.2)

## 2023-11-11 MED ORDER — FLUTICASONE FUROATE-VILANTEROL 100-25 MCG/ACT IN AEPB
1.0000 | INHALATION_SPRAY | Freq: Every day | RESPIRATORY_TRACT | Status: DC
  Administered 2023-11-12 – 2023-11-13 (×2): 1 via RESPIRATORY_TRACT
  Filled 2023-11-11: qty 28

## 2023-11-11 MED ORDER — ACETAMINOPHEN 500 MG PO TABS
500.0000 mg | ORAL_TABLET | Freq: Four times a day (QID) | ORAL | Status: DC | PRN
Start: 2023-11-11 — End: 2023-11-13
  Administered 2023-11-13: 500 mg via ORAL
  Filled 2023-11-11: qty 1

## 2023-11-11 MED ORDER — MEMANTINE HCL 10 MG PO TABS
10.0000 mg | ORAL_TABLET | Freq: Two times a day (BID) | ORAL | Status: DC
  Administered 2023-11-11 – 2023-11-13 (×4): 10 mg via ORAL
  Filled 2023-11-11 (×4): qty 1

## 2023-11-11 MED ORDER — MELATONIN 3 MG PO TABS
6.0000 mg | ORAL_TABLET | Freq: Every evening | ORAL | Status: DC | PRN
  Administered 2023-11-11: 6 mg via ORAL
  Filled 2023-11-11: qty 2

## 2023-11-11 MED ORDER — ALPRAZOLAM 0.5 MG PO TABS
0.5000 mg | ORAL_TABLET | Freq: Two times a day (BID) | ORAL | Status: DC
  Administered 2023-11-11 – 2023-11-13 (×4): 0.5 mg via ORAL
  Filled 2023-11-11 (×4): qty 1

## 2023-11-11 MED ORDER — PANTOPRAZOLE SODIUM 40 MG PO TBEC
40.0000 mg | DELAYED_RELEASE_TABLET | Freq: Every day | ORAL | Status: DC
  Administered 2023-11-11 – 2023-11-13 (×3): 40 mg via ORAL
  Filled 2023-11-11 (×3): qty 1

## 2023-11-11 MED ORDER — FUROSEMIDE 40 MG PO TABS
40.0000 mg | ORAL_TABLET | Freq: Every day | ORAL | Status: DC
  Administered 2023-11-11 – 2023-11-13 (×3): 40 mg via ORAL
  Filled 2023-11-11 (×3): qty 1

## 2023-11-11 MED ORDER — POLYETHYLENE GLYCOL 3350 17 G PO PACK: 17.0000 g | PACK | Freq: Every day | ORAL | Status: DC | PRN

## 2023-11-11 MED ORDER — DONEPEZIL HCL 5 MG PO TABS
10.0000 mg | ORAL_TABLET | Freq: Every day | ORAL | Status: DC
  Administered 2023-11-11 – 2023-11-12 (×2): 10 mg via ORAL
  Filled 2023-11-11 (×2): qty 2

## 2023-11-11 MED ORDER — ALPRAZOLAM 0.5 MG PO TABS
0.5000 mg | ORAL_TABLET | ORAL | Status: AC
  Administered 2023-11-11: 0.5 mg via ORAL
  Filled 2023-11-11: qty 1

## 2023-11-11 MED ORDER — PROCHLORPERAZINE EDISYLATE 10 MG/2ML IJ SOLN: 5.0000 mg | Freq: Four times a day (QID) | INTRAMUSCULAR | Status: DC | PRN

## 2023-11-11 MED ORDER — IPRATROPIUM-ALBUTEROL 0.5-2.5 (3) MG/3ML IN SOLN
3.0000 mL | Freq: Four times a day (QID) | RESPIRATORY_TRACT | Status: DC | PRN
Start: 2023-11-11 — End: 2023-11-13
  Administered 2023-11-11: 3 mL via RESPIRATORY_TRACT
  Filled 2023-11-11: qty 3

## 2023-11-11 MED ORDER — LINACLOTIDE 145 MCG PO CAPS: 145.0000 ug | ORAL_CAPSULE | Freq: Every day | ORAL | Status: DC | PRN

## 2023-11-11 MED ORDER — TRAMADOL HCL 50 MG PO TABS
50.0000 mg | ORAL_TABLET | Freq: Two times a day (BID) | ORAL | Status: AC | PRN
  Administered 2023-11-11 – 2023-11-12 (×2): 50 mg via ORAL
  Filled 2023-11-11 (×3): qty 1

## 2023-11-11 MED ORDER — ENOXAPARIN SODIUM 30 MG/0.3ML IJ SOSY
30.0000 mg | PREFILLED_SYRINGE | INTRAMUSCULAR | Status: DC
  Administered 2023-11-11 – 2023-11-12 (×2): 30 mg via SUBCUTANEOUS
  Filled 2023-11-11 (×3): qty 0.3

## 2023-11-11 MED ORDER — ZOLPIDEM TARTRATE 5 MG PO TABS
5.0000 mg | ORAL_TABLET | Freq: Every evening | ORAL | Status: DC | PRN
  Administered 2023-11-11 – 2023-11-12 (×2): 5 mg via ORAL
  Filled 2023-11-11 (×2): qty 1

## 2023-11-11 MED ORDER — POTASSIUM CHLORIDE ER 10 MEQ PO TBCR: 10.0000 meq | EXTENDED_RELEASE_TABLET | Freq: Every day | ORAL | Status: DC

## 2023-11-11 MED ORDER — PREGABALIN 25 MG PO CAPS
25.0000 mg | ORAL_CAPSULE | Freq: Two times a day (BID) | ORAL | Status: DC
  Administered 2023-11-11 – 2023-11-13 (×5): 25 mg via ORAL
  Filled 2023-11-11 (×5): qty 1

## 2023-11-11 MED ORDER — PREDNISONE 10 MG PO TABS: 10.0000 mg | ORAL_TABLET | Freq: Every morning | ORAL | Status: DC

## 2023-11-11 MED ORDER — ASPIRIN 81 MG PO TBEC
81.0000 mg | DELAYED_RELEASE_TABLET | Freq: Every day | ORAL | Status: DC
  Administered 2023-11-11 – 2023-11-13 (×3): 81 mg via ORAL
  Filled 2023-11-11 (×3): qty 1

## 2023-11-11 MED ORDER — SIMVASTATIN 10 MG PO TABS
10.0000 mg | ORAL_TABLET | Freq: Every day | ORAL | Status: DC
  Administered 2023-11-11 – 2023-11-12 (×2): 10 mg via ORAL
  Filled 2023-11-11 (×2): qty 1

## 2023-11-11 NOTE — H&P (Addendum)
 History and Physical  [name] FMW:[id number] DOB: 02-Oct-1934 DOA: 11/10/2023  Referring physician: Dr. Geroldine, EDP  PCP: Lacey Ophelia [name], MD  Outpatient Specialists: None Patient coming from: Home  Chief Complaint: Pain in the feet with swelling.   HPI: [name] is a 89 y.o. female with medical history significant for generalized anxiety disorder, hyperlipidemia, chronic HFpEF, GERD, insomnia, who presented to the ER via EMS from home where she lives with her daughter due to complaints of both feet and ankles swelling for weeks.  Associated with pain in her feet and discoloration of her toes when she stands on her feet.  No reported fevers or chills.  This evening she was having so much pain in her feet that she could not walk.  EMS was activated.  In the ER, alert in no acute distress.  Denies chest pain or shortness of breath.  Due to persistent symptomatology EDP requesting admission.  ED Course: Temperature 98.2.  BP 134/79, pulse 82, respiration rate 15, O2 saturation 96% on room air.  Review of Systems: Review of systems as noted in the HPI. All other systems reviewed and are negative.   Past Medical History:  Diagnosis Date   Anxiety    Chronic diastolic (congestive) heart failure (HCC)    Chronic obstructive pulmonary disease, unspecified (HCC)    COPD (chronic obstructive pulmonary disease) (HCC)    GERD (gastroesophageal reflux disease)    Hyperlipidemia    IBS (irritable bowel syndrome)    Lumbago with sciatica    Osteoporosis    Overactive bladder    Pneumonia    Pulmonary fibrosis (HCC)    Past Surgical History:  Procedure Laterality Date   COLONOSCOPY  10/04/2009   sigmoid diverticula, multiple tubulovillous adneomas, needs surveillance Oct 2013   ESOPHAGOGASTRODUODENOSCOPY  04/16/2011   Dr. Shaaron: Noncritical Schatzkis ring( not manipulated because no dysphagia). Normal esophagus otherwise  large hiatal hernia. Gastric polyp-status post biopsy.  Gastric erosions-staus post biopsy. Abnormal bulb-status post biopsy. benign small bowel, stomach biopsy with ulcerated gastric antral mucosa with foveolar hyperplasia and surface erosion, polyp with inflamed gastric antral mucosa    ESOPHAGOGASTRODUODENOSCOPY N/A 04/29/2014   Dr. Shaaron: Noncritical Schatzki's ring large hiatal hernia. Retained gastric contents.GES with slight delay   femur fracture Left 2023   HIP ARTHROPLASTY Left 09/24/2021   Procedure: ARTHROPLASTY BIPOLAR HIP (HEMIARTHROPLASTY);  Surgeon: Onesimo Oneil LABOR, MD;  Location: [facility];  Service: Orthopedics;  Laterality: Left;   HIP PINNING,CANNULATED Right 04/06/2018   Procedure: CANNULATED HIP PINNING;  Surgeon: Margrette Taft BRAVO, MD;  Location: [facility];  Service: Orthopedics;  Laterality: Right;    Social History:  reports that she has quit smoking. Her smoking use included cigarettes. She has been exposed to tobacco smoke. Her smokeless tobacco use includes snuff. She reports that she does not drink alcohol and does not use drugs.   Allergies  Allergen Reactions   Codeine Anaphylaxis, Shortness Of Breath and Rash   Neurontin [Gabapentin] Anaphylaxis   Sulfonamide Derivatives Hives and Shortness Of Breath   Aloe Other (See Comments)    Unknown reaction   Levaquin [Levofloxacin] Other (See Comments)    Pt admitted to hospital with lung problems due to this medication.   Ms Contin  [Morphine ] Other (See Comments)    Patient's daughter reports hypoxia requiring O2 with morphine  and similar opiates.  But does okay with tramadol .   Septra [Sulfamethoxazole-Trimethoprim] Hives   Sulfa Antibiotics Hives   Trimethoprim Hives   Vibra -Tab [  Doxycycline ] Hives    Family History  Problem Relation Age of Onset   Stroke Mother    Diabetes Mother    Heart disease Father    Colon cancer Neg Hx       Prior to Admission medications   Medication Sig Start Date End Date Taking? Authorizing Provider  acetaminophen  (TYLENOL ) 500 MG  tablet Take 1 tablet (500 mg total) by mouth every 8 (eight) hours as needed. Patient taking differently: Take 500 mg by mouth every 8 (eight) hours as needed for mild pain (pain score 1-3) or moderate pain (pain score 4-6). 08/26/22   Patsey Lot, MD  albuterol  (PROAIR  HFA) 108 [phone] Base) MCG/ACT inhaler Inhale 2 puffs into the lungs every 4 (four) hours as needed for wheezing or shortness of breath. Patient taking differently: No sig reported 06/12/21   Vicci Afton CROME, MD  ALPRAZolam  (XANAX ) 0.5 MG tablet Take 1 tablet (0.5 mg total) by mouth 2 (two) times daily. 07/29/22   Evonnie Lenis, MD  CRANBERRY PO Take 1 tablet by mouth daily.    [provider]  diclofenac  Sodium (VOLTAREN ) 1 % GEL APPLY AS DIRECTED TO THE AFFECTED KNEE FOUR TIMES DAILY. 08/04/20   Margrette Taft BRAVO, MD  donepezil  (ARICEPT ) 10 MG tablet Take 10 mg by mouth at bedtime. 07/25/19   [provider]  fluticasone  furoate-vilanterol (BREO ELLIPTA ) 100-25 MCG/ACT AEPB Inhale 1 puff into the lungs daily.    [provider]  furosemide  (LASIX ) 40 MG tablet Take 1 tablet (40 mg total) by mouth daily. With an extra 40 Mg tablet as needed for swelling, shortness of breath and weight gain. 04/29/23   Miriam Norris, NP  ipratropium-albuterol  (DUONEB) 0.5-2.5 (3) MG/3ML SOLN Take 3 mLs by nebulization in the morning, at noon, in the evening, and at bedtime.    [provider]  LINZESS  145 MCG CAPS capsule Take 145 mcg by mouth daily as needed (constipation). 05/22/21   [provider]  loratadine  (CLARITIN ) 10 MG tablet Take 10 mg by mouth daily. 10/19/23   [provider]  memantine  (NAMENDA ) 10 MG tablet Take 10 mg by mouth 2 (two) times daily.    [provider]  midodrine  (PROAMATINE ) 5 MG tablet Take 1 tablet (5 mg total) by mouth 2 (two) times daily with a meal. 01/21/23   Debera Jayson MATSU, MD  Multiple Vitamins-Minerals (ONE A DAY WOMEN 50 PLUS PO) Take 1 tablet by  mouth daily.    [provider]  pantoprazole  (PROTONIX ) 40 MG tablet TAKE (1) TABLET BY MOUTH TWICE A DAY WITH MEALS (BREAKFAST AND SUPPER) 05/24/19   Marvis Camellia LABOR, NP  potassium chloride  (KLOR-CON ) 10 MEQ tablet Take 1 tablet (10 mEq total) by mouth daily. When taking lasix . 11/30/22   Ricky Fines, MD  predniSONE  (DELTASONE ) 10 MG tablet Take 10 mg by mouth every morning. 09/23/23   [provider]  simvastatin  (ZOCOR ) 10 MG tablet Take 10 mg by mouth at bedtime. 02/27/21   [provider]  traMADol  (ULTRAM ) 50 MG tablet Take 1 tablet (50 mg total) by mouth every 6 (six) hours as needed for moderate pain or severe pain. Patient taking differently: Take 50 mg by mouth every 4 (four) hours. 07/29/22   Evonnie Lenis, MD  traZODone  (DESYREL ) 50 MG tablet Take 1 tablet by mouth at bedtime as needed for sleep. Patient not taking: Reported on 10/15/2023 05/02/23 05/01/24  [provider]  zolpidem  (AMBIEN ) 5 MG tablet Take 1 tablet (  5 mg total) by mouth at bedtime. 07/29/22   Evonnie Lenis, MD    Physical Exam: BP 119/75   Pulse 83   Temp 98.6 F (37 C) (Oral)   Resp 18   Ht 5' 1 (1.549 m)   Wt 53.4 kg   SpO2 94%   BMI 22.24 kg/m   General: 89 y.o. year-old female well developed well nourished in no acute distress.  Alert and oriented x3. Cardiovascular: Regular rate and rhythm with no rubs or gallops.  No thyromegaly or JVD noted.  Trace edema in lower extremities bilaterally. Respiratory: Clear to auscultation with no wheezes or rales. Good inspiratory effort. Abdomen: Soft nontender nondistended with normal bowel sounds x4 quadrants. Muskuloskeletal: No cyanosis or clubbing noted bilaterally Neuro: CN II-XII intact, strength, sensation, reflexes Skin: No ulcerative lesions noted or rashes.  No significant discoloration of the toes appreciated. Psychiatry: Judgement and insight appear normal. Mood is appropriate for condition and setting          Labs on  Admission:  Basic Metabolic Panel: Recent Labs  Lab 11/10/23 2127  NA 136  K 5.2*  CL 97*  CO2 28  GLUCOSE 124*  BUN 29*  CREATININE 1.47*  CALCIUM  9.5   Liver Function Tests: Recent Labs  Lab 11/10/23 2127  AST 28  ALT 16  ALKPHOS 113  BILITOT 0.2  PROT 7.3  ALBUMIN  4.2   No results for input(s): LIPASE, AMYLASE in the last 168 hours. No results for input(s): AMMONIA in the last 168 hours. CBC: Recent Labs  Lab 11/10/23 2127  WBC 6.5  NEUTROABS 4.2  HGB 10.7*  HCT 33.9*  MCV 90.9  PLT 235   Cardiac Enzymes: No results for input(s): CKTOTAL, CKMB, CKMBINDEX, TROPONINI in the last 168 hours.  BNP (last 3 results) Recent Labs    11/27/22 2101  BNP 117.0*    ProBNP (last 3 results) Recent Labs    10/20/23 1554 11/10/23 2127  PROBNP 457.0* 476.0*    CBG: No results for input(s): GLUCAP in the last 168 hours.  Radiological Exams on Admission: DG Chest Port 1 View Result Date: 11/10/2023 CLINICAL DATA:  Edema in the bilateral feet, pain with walking EXAM: PORTABLE CHEST 1 VIEW COMPARISON:  10/20/2023, CT chest 11/27/2022, chest x-ray 05/30/2023, 08/10/2022 FINDINGS: Large hiatal hernia. Stable cardiomediastinal silhouette. Chronic interstitial lung disease with fibrosis and bronchiectasis. No acute confluent airspace disease, pleural effusion, or pneumothorax. Aortic atherosclerosis. Multiple chronic rib fractures. IMPRESSION: No active disease. Chronic interstitial lung disease with fibrosis and bronchiectasis. Large hiatal hernia. Electronically Signed   By: Luke Bun M.D.   On: 11/10/2023 22:40    EKG: I independently viewed the EKG done and my findings are as followed: None available at the time of this visit.  Assessment/Plan Present on Admission:  Bilateral swelling of feet  Principal Problem:   Bilateral swelling of feet  Bilateral swelling of feet and pain. Pain control Resume home diuretics  Reported discoloration of  skin toes No significant discoloration of toes noted on exam. The skin is warm and dry. Continue to closely monitor.  Chronic HFpEF Resume home regimen Strict I's and O's and daily weight.  CKD 3B Creatinine 1.47 with GFR 34 Avoid nephrotoxic agents and hypotension. Monitor urine output Repeat BMP in the morning  Hyperkalemia Hold off home potassium supplement Repeat BMP in the morning  Anemia of chronic disease Hemoglobin 10.7 with MCV of 90 Appears at baseline  Insomnia Resume home regimen  Generalized anxiety  disorder Resume home regimen.  Generalized weakness PT OT evaluation Fall precautions.   Time: 75 minutes.   DVT prophylaxis: Subcu Lovenox  daily.  Code Status: DNR.  Family Communication: None at bedside.  Disposition Plan: Admitted to telemetry unit.  Consults called: None.  Admission status: Observation status.   Status is: Observation    Terry LOISE Hurst MD [facility] Pager [phone]  If 7PM-7AM, please contact night-coverage [email or web address] Password [email or web address]  11/11/2023, 1:06 AM

## 2023-11-11 NOTE — TOC CM/SW Note (Signed)
 Transition of Care [facility]) - Inpatient Brief Assessment   Patient Details  Name: [name] MRN: [id number] Date of Birth: 1934-04-11  Transition of Care [facility]) CM/SW Contact:    Lucie Lunger, LCSWA Phone Number: 11/11/2023, 8:50 AM   Clinical Narrative: Transition of Care Department [facility]) has reviewed patient and no TOC needs have been identified at this time. We will continue to monitor patient advancement through interdiciplinary progression rounds. If new patient transition needs arise, please place a TOC consult.  Transition of Care Asessment: Insurance and Status: Insurance coverage has been reviewed Patient has primary care physician: Yes Home environment has been reviewed: From home with daughter Prior level of function:: Family availiable Prior/Current Home Services: No current home services Social Drivers of Health Review: SDOH reviewed no interventions necessary Readmission risk has been reviewed: Yes Transition of care needs: no transition of care needs at this time

## 2023-11-11 NOTE — Progress Notes (Signed)
 Mobility Specialist Progress Note:    11/11/23 1343  Mobility  Activity Pivoted/transferred from bed to chair  Level of Assistance Maximum assist, patient does 25-49%  Assistive Device None  Distance Ambulated (ft) 2 ft  Range of Motion/Exercises Active;All extremities  Activity Response Tolerated well  Mobility Referral Yes  Mobility visit 1 Mobility  Mobility Specialist Start Time (ACUTE ONLY) 1443  Mobility Specialist Stop Time (ACUTE ONLY) 1501  Mobility Specialist Time Calculation (min) (ACUTE ONLY) 18 min   Pt received in bed, agreeable to mobility. Required MaxA to stand and pivot with no AD. Tolerated well, asx throughout. Alarm on, call bell in reach. All needs met.  Jamaya Sleeth Mobility Specialist Please contact via Special Educational Needs Teacher or  Rehab office at [phone]

## 2023-11-11 NOTE — ED Provider Notes (Signed)
 [facility] EMERGENC[facility] DEPARTMENT AT [facility] Provider Note   CSN: [id number] Arrival date & time: 11/10/23  2051     Patient presents with: Leg Swelling   [name] is a 89 y.o. female.   Patient is an 89 year old female with past medical history of GERD, COPD, prior UTIs, dementia, CHF.  Patient presenting today with complaints of pain and swelling in both feet.  She also describes discoloration of the toes that seems to be coming and going.  This evening she was having so much discomfort in her feet that she could not walk.  EMS was called and patient was transported here.  She denies to me that she is having any shortness of breath or chest pain.  She is on Lasix  and reports being compliant with this.       Prior to Admission medications   Medication Sig Start Date End Date Taking? Authorizing Provider  acetaminophen  (TYLENOL ) 500 MG tablet Take 1 tablet (500 mg total) by mouth every 8 (eight) hours as needed. Patient taking differently: Take 500 mg by mouth every 8 (eight) hours as needed for mild pain (pain score 1-3) or moderate pain (pain score 4-6). 08/26/22   Patsey Lot, MD  albuterol  (PROAIR  HFA) 108 (90 Base) MCG/ACT inhaler Inhale 2 puffs into the lungs every 4 (four) hours as needed for wheezing or shortness of breath. Patient taking differently: No sig reported 06/12/21   Vicci Afton LITTIE, MD  ALPRAZolam  (XANAX ) 0.5 MG tablet Take 1 tablet (0.5 mg total) by mouth 2 (two) times daily. 07/29/22   Evonnie Lenis, MD  CRANBERRY PO Take 1 tablet by mouth daily.    [provider]  diclofenac  Sodium (VOLTAREN ) 1 % GEL APPLY AS DIRECTED TO THE AFFECTED KNEE FOUR TIMES DAILY. 08/04/20   Margrette Taft BRAVO, MD  donepezil  (ARICEPT ) 10 MG tablet Take 10 mg by mouth at bedtime. 07/25/19   [provider]  fluticasone  furoate-vilanterol (BREO ELLIPTA ) 100-25 MCG/ACT AEPB Inhale 1 puff into the lungs daily.    [provider]  furosemide   (LASIX ) 40 MG tablet Take 1 tablet (40 mg total) by mouth daily. With an extra 40 Mg tablet as needed for swelling, shortness of breath and weight gain. 04/29/23   Miriam Norris, NP  ipratropium-albuterol  (DUONEB) 0.5-2.5 (3) MG/3ML SOLN Take 3 mLs by nebulization in the morning, at noon, in the evening, and at bedtime.    [provider]  LINZESS  145 MCG CAPS capsule Take 145 mcg by mouth daily as needed (constipation). 05/22/21   [provider]  loratadine  (CLARITIN ) 10 MG tablet Take 10 mg by mouth daily. 10/19/23   [provider]  memantine  (NAMENDA ) 10 MG tablet Take 10 mg by mouth 2 (two) times daily.    [provider]  midodrine  (PROAMATINE ) 5 MG tablet Take 1 tablet (5 mg total) by mouth 2 (two) times daily with a meal. 01/21/23   Debera Jayson MATSU, MD  Multiple Vitamins-Minerals (ONE A DAY WOMEN 50 PLUS PO) Take 1 tablet by mouth daily.    [provider]  pantoprazole  (PROTONIX ) 40 MG tablet TAKE (1) TABLET BY MOUTH TWICE A DAY WITH MEALS (BREAKFAST AND SUPPER) 05/24/19   Marvis Camellia LABOR, NP  potassium chloride  (KLOR-CON ) 10 MEQ tablet Take 1 tablet (10 mEq total) by mouth daily. When taking lasix . 11/30/22   Ricky Fines, MD  predniSONE  (DELTASONE ) 10 MG tablet Take 10 mg by mouth every morning. 09/23/23   [provider]  simvastatin  (ZOCOR ) 10 MG tablet Take 10 mg by mouth at bedtime. 02/27/21   [provider]  traMADol  (ULTRAM ) 50 MG tablet Take 1 tablet (50 mg total) by mouth every 6 (six) hours as needed for moderate pain or severe pain. Patient taking differently: Take 50 mg by mouth every 4 (four) hours. 07/29/22   Evonnie Lenis, MD  traZODone  (DESYREL ) 50 MG tablet Take 1 tablet by mouth at bedtime as needed for sleep. Patient not taking: Reported on 10/15/2023 05/02/23 05/01/24  [provider]  zolpidem  (AMBIEN ) 5 MG tablet Take 1 tablet (5 mg total) by mouth at bedtime. 07/29/22   Evonnie Lenis, MD    Allergies:  Codeine, Neurontin [gabapentin], Sulfonamide derivatives, Aloe, Levaquin [levofloxacin], Ms contin  [morphine ], Septra [sulfamethoxazole-trimethoprim], Sulfa antibiotics, Trimethoprim, and Vibra -tab [doxycycline ]    Review of Systems  All other systems reviewed and are negative.   Updated Vital Signs BP 119/75   Pulse 83   Temp 98.6 F (37 C) (Oral)   Resp 18   Ht 5' 1 (1.549 m)   Wt 53.4 kg   SpO2 94%   BMI 22.24 kg/m   Physical Exam Vitals and nursing note reviewed.  Constitutional:      General: She is not in acute distress.    Appearance: She is well-developed. She is not diaphoretic.  HENT:     Head: Normocephalic and atraumatic.  Cardiovascular:     Rate and Rhythm: Normal rate and regular rhythm.     Heart sounds: No murmur heard.    No friction rub. No gallop.  Pulmonary:     Effort: Pulmonary effort is normal. No respiratory distress.     Breath sounds: Normal breath sounds. No wheezing.  Abdominal:     General: Bowel sounds are normal. There is no distension.     Palpations: Abdomen is soft.     Tenderness: There is no abdominal tenderness.  Musculoskeletal:        General: Normal range of motion.     Cervical back: Normal range of motion and neck supple.     Comments: There is perhaps trace edema of both feet.  I appreciate no significant discoloration of the toes.  Skin:    General: Skin is warm and dry.  Neurological:     General: No focal deficit present.     Mental Status: She is alert and oriented to person, place, and time.        (all labs ordered are listed, but only abnormal results are displayed) Labs Reviewed  CBC WITH DIFFERENTIAL/PLATELET - Abnormal; Notable for the following components:      Result Value   RBC 3.73 (*)    Hemoglobin 10.7 (*)    HCT 33.9 (*)    All other components within normal limits  COMPREHENSIVE METABOLIC PANEL WITH GFR - Abnormal; Notable for the following components:   Potassium 5.2 (*)    Chloride 97 (*)     Glucose, Bld 124 (*)    BUN 29 (*)    Creatinine, Ser 1.47 (*)    GFR, Estimated 34 (*)    All other components within normal limits  PRO BRAIN NATRIURETIC PEPTIDE - Abnormal; Notable for the following components:   Pro Brain Natriuretic Peptide 476.0 (*)    All other components within normal limits  URINALYSIS, ROUTINE W REFLEX MICROSCOPIC    EKG: None  Radiology: [facility] Chest Port 1 View Result Date: 11/10/2023 CLINICAL DATA:  Edema in the bilateral  feet, pain with walking EXAM: PORTABLE CHEST 1 VIEW COMPARISON:  10/20/2023, CT chest 11/27/2022, chest x-ray 05/30/2023, 08/10/2022 FINDINGS: Large hiatal hernia. Stable cardiomediastinal silhouette. Chronic interstitial lung disease with fibrosis and bronchiectasis. No acute confluent airspace disease, pleural effusion, or pneumothorax. Aortic atherosclerosis. Multiple chronic rib fractures. IMPRESSION: No active disease. Chronic interstitial lung disease with fibrosis and bronchiectasis. Large hiatal hernia. Electronically Signed   By: Luke Bun M.D.   On: 11/10/2023 22:40     Procedures   Medications Ordered in the ED - No data to display                                  Medical Decision Making Risk Decision regarding hospitalization.   Patient is an 89 year old female presenting with complaints of bilateral foot pain.  She describes swelling to both feet along with intermittent discoloration of her toes.  She describes her toes turning blue.  This has made it impossible for her to walk.  She has been seen as an outpatient with similar complaints and was scheduled for an ABI of her legs.  She arrives here with minimal edema of both feet and no appreciable blue discoloration.  Laboratory studies obtained including CBC, CMP, and BNP.  BNP is mildly elevated at 476, but laboratory studies otherwise unremarkable.  Chest x-ray shows no acute process.  I discussed care with patient's daughter who has informed me that her toes are  indeed turning blue and that she is having difficulty walking at home.  At this point, does not feel safe for patient to return to her current environment.  I will speak with the hospitalist for possible admission and vascular studies of her legs.     Final diagnoses:  None    ED Discharge Orders     None          Geroldine Berg, MD 11/11/23 [phone]

## 2023-11-11 NOTE — Care Management Obs Status (Signed)
 MEDICARE OBSERVATION STATUS NOTIFICATION   Patient Details  Name: [name] MRN: [id number] Date of Birth: December 01, 1934   Medicare Observation Status Notification Given:  Yes  Copy mailed to address on file (certified)  Duwaine LITTIE Ada 11/11/2023, 1:11 PM

## 2023-11-11 NOTE — TOC Initial Note (Signed)
 Transition of Care [facility]) - Initial/Assessment Note    Patient Details  Name: [name] MRN: [id number] Date of Birth: 1934-01-21  Transition of Care [facility]) CM/SW Contact:    Lucie Lunger, LCSWA Phone Number: 11/11/2023, 1:08 PM  Clinical Narrative:                 CSW notes that PT is recommending SNF for pt. CSW spoke with pts daughter to review. CSW explained that pt has traditional Medicare benefits and is under Observation so pt will not have the needed 3 midnights needed to qualify for SNF with insurance coverage. CSW explained this and that would mean they would need to private pay for SNF stay, at this time pts states they are unable to do this. Plan will be for return home with [facility] services. CSW notes pt has used [facility] in the past, services will be started again. Pt has a walker, wheel chair, BSC and hospital bed in the home. TOC to follow.   Expected Discharge Plan: Home w Home Health Services Barriers to Discharge: Continued Medical Work up   Patient Goals and CMS Choice Patient states their goals for this hospitalization and ongoing recovery are:: get stronger CMS Medicare.gov Compare Post Acute Care list provided to:: Patient Choice offered to / list presented to : Patient      Expected Discharge Plan and Services In-house Referral: Clinical Social Work Discharge Planning Services: CM Consult Post Acute Care Choice: Home Health Living arrangements for the past 2 months: Single Family Home                                      Prior Living Arrangements/Services Living arrangements for the past 2 months: Single Family Home Lives with:: Adult Children, Relatives Patient language and need for interpreter reviewed:: Yes Do you feel safe going back to the place where you live?: Yes      Need for Family Participation in Patient Care: Yes (Comment) Care giver support system in place?: Yes (comment) Current home services: DME Criminal Activity/Legal  Involvement Pertinent to Current Situation/Hospitalization: No - Comment as needed  Activities of Daily Living   ADL Screening (condition at time of admission) Independently performs ADLs?: No Does the patient have a NEW difficulty with bathing/dressing/toileting/self-feeding that is expected to last >3 days?: No Does the patient have a NEW difficulty with getting in/out of bed, walking, or climbing stairs that is expected to last >3 days?: No Does the patient have a NEW difficulty with communication that is expected to last >3 days?: No Is the patient deaf or have difficulty hearing?: Yes Does the patient have difficulty seeing, even when wearing glasses/contacts?: No Does the patient have difficulty concentrating, remembering, or making decisions?: No  Permission Sought/Granted                  Emotional Assessment Appearance:: Appears stated age Attitude/Demeanor/Rapport: Engaged Affect (typically observed): Accepting Orientation: : Oriented to Self, Oriented to Place, Oriented to  Time, Oriented to Situation Alcohol / Substance Use: Not Applicable Psych Involvement: No (comment)  Admission diagnosis:  Bilateral swelling of feet [M79.89] Patient Active Problem List   Diagnosis Date Noted   Bilateral swelling of feet 11/11/2023   Cellulitis of left foot 10/15/2023   Acute metabolic encephalopathy 10/15/2023   AKI (acute kidney injury) 11/28/2022   Hyperkalemia 11/28/2022   Hypotension 08/28/2022   Dehydration  08/28/2022   Hypokalemia 08/28/2022   Hip pain 08/28/2022   Lactic acidosis 08/28/2022   Closed fracture of right distal radius 07/28/2022   Pressure injury of skin 07/26/2022   Closed L1 vertebral fracture (HCC) 07/26/2022   Fall 07/25/2022   Compression fracture of L1,L2 vertebra (HCC) 07/25/2022   Influenza A 04/28/2022   Hypoxia 04/23/2022   Anemia, unspecified 11/23/2021   Long term (current) use of inhaled steroids 11/23/2021   Unspecified dementia,  unspecified severity, with mood disturbance (HCC) 11/23/2021   Fracture of femoral neck, left, closed (HCC) 09/24/2021   Acute respiratory failure with hypoxia (HCC) 06/10/2021   Tobacco use disorder 06/10/2021   Generalized weakness 06/10/2021   Bilateral conjunctivitis 06/10/2021   Chronic diastolic CHF (congestive heart failure) (HCC) 05/14/2021   Depression 05/14/2021   Hyperlipidemia    COPD exacerbation (HCC) 05/13/2021   Long term (current) use of opiate analgesic 01/04/2021   Chronic pain 04/23/2019   General unsteadiness 04/23/2019   Polyarthropathy 04/23/2019   Pseudobulbar affect 04/23/2019   Tremor 04/23/2019   Chronic obstructive pulmonary disease, unspecified (HCC)    Gastro-esophageal reflux disease without esophagitis    Hyperlipidemia, unspecified    Pulmonary fibrosis, unspecified (HCC)    Acute on chronic diastolic (congestive) heart failure (HCC)    Age-related osteoporosis without current pathological fracture    Anxiety disorder, unspecified    Chest pain, unspecified    Chronic diastolic (congestive) heart failure (HCC)    Collapsed vertebra, not elsewhere classified, site unspecified, subsequent encounter for fracture with routine healing    Edema, unspecified    Lumbago with sciatica    Overactive bladder    Pain in unspecified hand    Pressure ulcer of unspecified site, unspecified stage    Unspecified protein-calorie malnutrition    Acute diastolic heart failure (HCC) 09/30/2018   Dyspnea 09/29/2018   Pneumonia 06/30/2018   Acute CHF (congestive heart failure) (HCC) 06/29/2018   Vascular dementia without behavioral disturbance (HCC) 06/26/2018   CAP (community acquired pneumonia) 06/23/2018   Bilateral pleural effusion 06/23/2018   Leukocytosis 06/20/2018   Chronic anemia 06/20/2018   Dementia (HCC) 06/19/2018   Bilateral lower extremity edema 06/18/2018   Protein-calorie malnutrition, severe 06/18/2018   S/P internal fixation right hip fracture  cannulated screw placement 04/06/18 04/27/2018   UTI (urinary tract infection) 04/11/2018   Altered mental status 04/10/2018   Anxiety 04/10/2018   Hyponatremia 04/10/2018   UTI (urinary tract infection) due to urinary indwelling catheter 04/10/2018   Chronic constipation 04/09/2018   Urinary retention 04/09/2018   Closed right hip fracture (HCC) 04/05/2018   GERD (gastroesophageal reflux disease) 04/05/2018   COPD (chronic obstructive pulmonary disease) (HCC) 04/05/2018   Hiatal hernia    Dyspepsia 04/04/2014   Hematochezia 04/30/2011   RUQ pain 03/25/2011   Adenomatous polyps 03/25/2011   ABDOMINAL PAIN, LEFT LOWER QUADRANT 10/16/2009   HYPERLIPIDEMIA 10/09/2009   BRONCHITIS 10/09/2009   Osteoporosis 10/09/2009   Personal history of pneumonia (recurrent) 01/05/1999   Mixed hyperlipidemia 01/05/1999   PCP:  [name], MD Pharmacy:   [facility] - [place], [place] - [address] [place] [address] Phone: [phone] Fax: [phone]  [facility] [id number] - [place], [place] - [address] AT [facility] [address] [place] [address] Phone: [phone] Fax: [phone]  [facility] - [facility] [address] [place] [address] Phone:  [phone] Fax: [phone]     Social Drivers of Health (SDOH) Social History: SDOH Screenings   Food Insecurity: Unknown (11/11/2023)  Housing: Low Risk  (11/11/2023)  Transportation Needs: No Transportation Needs (11/11/2023)  Utilities: Not At Risk (11/11/2023)  Financial Resource Strain: Low Risk  (08/11/2023)   Received from [facility]  Social Connections: Patient Declined (11/11/2023)  Tobacco Use: High Risk (11/11/2023)   SDOH Interventions:     Readmission Risk Interventions    11/29/2022    1:31 PM 07/26/2022   12:28 PM 09/30/2021    9:22 AM  Readmission Risk Prevention Plan   Transportation Screening Complete Complete Complete  PCP or Specialist Appt within 5-7 Days  Not Complete   Home Care Screening  Complete   Medication Review (RN CM)  Complete   Medication Review (RN Care Manager) Complete  Complete  HRI or Home Care Consult Complete  Complete  SW Recovery Care/Counseling Consult Complete  Complete  Palliative Care Screening Not Applicable  Not Applicable  Skilled Nursing Facility Not Applicable  Complete

## 2023-11-11 NOTE — Plan of Care (Signed)
  Problem: Acute Rehab PT Goals(only PT should resolve) Goal: Pt Will Go Supine/Side To Sit Outcome: Progressing Flowsheets (Taken 11/11/2023 1237) Pt will go Supine/Side to Sit: with contact guard assist Goal: Patient Will Transfer Sit To/From Stand Outcome: Progressing Flowsheets (Taken 11/11/2023 1237) Patient will transfer sit to/from stand: with contact guard assist Goal: Pt Will Transfer Bed To Chair/Chair To Bed Outcome: Progressing Flowsheets (Taken 11/11/2023 1237) Pt will Transfer Bed to Chair/Chair to Bed: with contact guard assist Goal: Pt Will Ambulate Outcome: Progressing Flowsheets (Taken 11/11/2023 1237) Pt will Ambulate:  10 feet  with rolling walker  with contact guard assist    12:37 PM, 11/11/23 Rosaria Settler, PT, DPT [place] with [facility]

## 2023-11-11 NOTE — Evaluation (Addendum)
 Physical Therapy Evaluation Patient Details Name: [name] MRN: [id number] DOB: 1934-06-11 Today's Date: 11/11/2023  History of Present Illness  [name] is a 89 y.o. female with medical history significant for generalized anxiety disorder, hyperlipidemia, chronic HFpEF, GERD, insomnia, who presented to the ER via EMS from home where she lives with her daughter due to complaints of both feet and ankles swelling for weeks.  Associated with pain in her feet and discoloration of her toes when she stands on her feet.  No reported fevers or chills.  This evening she was having so much pain in her feet that she could not walk.  EMS was activated.   Clinical Impression  Patient agreeable to PT evaluation this date. Patient reports at baseline, she is a household ambulator with RW and supervision but requires assist with ADL/iADLs. Recently, she has been unable to stand or ambulate due to increased ankle/foot swelling/pain. This date, patient requires total assist to donn socks,  min/mod assist for bed mobility and functional transfers as well as very limited ambulation at bedside with RW. Pt limited by pain, general weakness, general fatigue, and balance. Pt tolerates sitting in chair at end of session, call button within reach, and chair alarm set. Patient will benefit from continued skilled physical therapy acutely and in recommended venue in order to address current deficits and improve overall function/independence.        If plan is discharge home, recommend the following: A lot of help with walking and/or transfers;A lot of help with bathing/dressing/bathroom;Assist for transportation;Assistance with cooking/housework;Help with stairs or ramp for entrance   Can travel by private vehicle        Equipment Recommendations None recommended by PT  Recommendations for Other Services       Functional Status Assessment Patient has had a recent decline in their functional status and  demonstrates the ability to make significant improvements in function in a reasonable and predictable amount of time.     Precautions / Restrictions Precautions Precautions: Fall Recall of Precautions/Restrictions: Intact Restrictions Weight Bearing Restrictions Per Provider Order: No      Mobility  Bed Mobility Overal bed mobility: Needs Assistance Bed Mobility: Supine to Sit     Supine to sit: Min assist, Mod assist     General bed mobility comments: HOB elevated to mimic home, use of railings, pt demo slow labored movement, req assist for LE and trunk handling    Transfers Overall transfer level: Needs assistance Equipment used: Rolling walker (2 wheels) Transfers: Sit to/from Stand, Bed to chair/wheelchair/BSC Sit to Stand: Min assist, Mod assist   Step pivot transfers: Min assist, Mod assist       General transfer comment: min/mod assist with RW, pt with very slow labored movement and gen LE weakness, unsteady on feet    Ambulation/Gait Ambulation/Gait assistance: Min assist Gait Distance (Feet): 3 Feet Assistive device: Rolling walker (2 wheels) Gait Pattern/deviations: Step-to pattern, Decreased step length - right, Decreased step length - left, Trunk flexed, Knees buckling Gait velocity: Dec     General Gait Details: Limited to a few small length side steps at bedside with RW, pt very kyphotic, unsteady once on feet requiring min assist throughout, limited by fatigue and pain  Stairs            Wheelchair Mobility     Tilt Bed    Modified Rankin (Stroke Patients Only)       Balance Overall balance assessment: Needs assistance Sitting-balance support: Feet  supported, No upper extremity supported Sitting balance-Leahy Scale: Fair Sitting balance - Comments: seated EOB   Standing balance support: During functional activity, Reliant on assistive device for balance, Bilateral upper extremity supported Standing balance-Leahy Scale:  Poor Standing balance comment: fair to poor with RW           Pertinent Vitals/Pain Pain Assessment Pain Assessment: No/denies pain    Home Living Family/patient expects to be discharged to:: Private residence Living Arrangements: Children Available Help at Discharge: Personal care attendant;Family;Available 24 hours/day Type of Home: Mobile home Home Access: Ramped entrance       Home Layout: One level Home Equipment: Shower seat;Hand held shower head;Grab bars - tub/shower;Grab bars - toilet;Rolling Walker (2 wheels);Hospital bed;Wheelchair - manual;BSC/3in1 Additional Comments: Combination of pt report and previous chart informmation. Pt reporting daughter unable to assist with her current physical need    Prior Function Prior Level of Function : Needs assist       Physical Assist : ADLs (physical);Mobility (physical) Mobility (physical): Bed mobility;Transfers;Gait;Stairs ADLs (physical): Dressing;Bathing Mobility Comments: household ambulator with RW and supervision ADLs Comments: Assisted by family and PCA     Extremity/Trunk Assessment   Upper Extremity Assessment Upper Extremity Assessment: Generalized weakness (shoudler flexion ROM dec bilaterally, ~90--100 deg, PROM ~120 not painful on LUE but pain with RUE, springy end feel. shoulder flexion MMT 4-/5 bilater)    Lower Extremity Assessment Lower Extremity Assessment: Generalized weakness;LLE deficits/detail;RLE deficits/detail RLE Deficits / Details: pt reports inc pain with R foot WB, RLE ankle DF and hip flexion MMT dec compared to L, ~4-/5 with RLE RLE Coordination: decreased gross motor LLE Deficits / Details: Noted inc edema/swelling in L foot as compared to R, ankle DF and hip flexion MMT grossly 4/5 LLE Coordination: decreased gross motor    Cervical / Trunk Assessment Cervical / Trunk Assessment: Kyphotic  Communication   Communication Communication: Impaired Factors Affecting Communication:  Reduced clarity of speech;Hearing impaired    Cognition Arousal: Alert Behavior During Therapy: WFL for tasks assessed/performed     Following commands: Intact       Cueing Cueing Techniques: Verbal cues, Visual cues, Tactile cues     General Comments      Exercises     Assessment/Plan    PT Assessment Patient needs continued PT services;All further PT needs can be met in the next venue of care  PT Problem List Decreased strength;Decreased range of motion;Decreased activity tolerance;Decreased balance;Decreased mobility;Pain       PT Treatment Interventions DME instruction;Gait training;Stair training;Patient/family education;Functional mobility training;Therapeutic activities;Therapeutic exercise;Balance training    PT Goals (Current goals can be found in the Care Plan section)  Acute Rehab PT Goals Patient Stated Goal: Return home PT Goal Formulation: With patient Time For Goal Achievement: 11/25/23 Potential to Achieve Goals: Good    Frequency Min 3X/week     Co-evaluation               AM-PAC PT 6 Clicks Mobility  Outcome Measure Help needed turning from your back to your side while in a flat bed without using bedrails?: A Little Help needed moving from lying on your back to sitting on the side of a flat bed without using bedrails?: A Lot Help needed moving to and from a bed to a chair (including a wheelchair)?: A Little Help needed standing up from a chair using your arms (e.g., wheelchair or bedside chair)?: A Lot Help needed to walk in hospital room?: A Little Help needed climbing 3-5  steps with a railing? : A Lot 6 Click Score: 15    End of Session Equipment Utilized During Treatment: Gait belt Activity Tolerance: Patient tolerated treatment well;Patient limited by pain;Patient limited by fatigue Patient left: in chair;with call bell/phone within reach;with chair alarm set Nurse Communication: Mobility status PT Visit Diagnosis: Unsteadiness on  feet (R26.81);Muscle weakness (generalized) (M62.81);Pain;Other abnormalities of gait and mobility (R26.89);Difficulty in walking, not elsewhere classified (R26.2) Pain - Right/Left:  (Both) Pain - part of body: Ankle and joints of foot    Time: 9099-9075 PT Time Calculation (min) (ACUTE ONLY): 24 min   Charges:   PT Evaluation $PT Eval Low Complexity: 1 Low   PT General Charges $$ ACUTE PT VISIT: 1 Visit         12:35 PM, 11/11/23 Kemaria Dedic Powell-Butler, PT, DPT [place] with [facility]

## 2023-11-11 NOTE — Progress Notes (Signed)
 Patient seen and examined; admitted after midnight secondary to bilateral leg/feet pain and per family reports intermittent toes cyanotic changes.  At time of evaluation hemodynamically stable, no chest pain, no nausea, no vomiting chills expressing ongoing pain on her legs and feet bilaterally.  There is no significant swelling or discoloration on exam.  Patient with some chronic deformities most likely associated with arthritis.  Please refer to H&P written by Dr. Shona on 11/11/2023 for further info/details on admission.  Plan of CARE - Arterial ultrasound/ABI has been ordered to rule out any compromising perfusion. -Continue as needed analgesics and started Lyrica in order to help with any component of neuropathic pain. -Physical therapy evaluation has been requested and will follow any recommendations. -Continue supportive care and assistance.  Eric Nunnery MD [phone]

## 2023-11-11 NOTE — ED Notes (Signed)
 Report given to [facility] LPN of [facility] at this time. No questions, comments, or concerns after report was given.

## 2023-11-12 DIAGNOSIS — M79672 Pain in left foot: Secondary | ICD-10-CM | POA: Diagnosis not present

## 2023-11-12 DIAGNOSIS — M7989 Other specified soft tissue disorders: Secondary | ICD-10-CM | POA: Diagnosis not present

## 2023-11-12 DIAGNOSIS — M79671 Pain in right foot: Secondary | ICD-10-CM

## 2023-11-12 LAB — BASIC METABOLIC PANEL WITH GFR
Anion gap: 11 (ref 5–15)
BUN: 20 mg/dL (ref 8–23)
CO2: 29 mmol/L (ref 22–32)
Calcium: 9.3 mg/dL (ref 8.9–10.3)
Chloride: 98 mmol/L (ref 98–111)
Creatinine, Ser: 1.15 mg/dL — ABNORMAL HIGH (ref 0.44–1.00)
GFR, Estimated: 46 mL/min — ABNORMAL LOW (ref >60)
Glucose, Bld: 86 mg/dL (ref 70–99)
Potassium: 4.1 mmol/L (ref 3.5–5.1)
Sodium: 139 mmol/L (ref 135–145)

## 2023-11-12 MED ORDER — POTASSIUM CHLORIDE ER 10 MEQ PO TBCR: 10.0000 meq | EXTENDED_RELEASE_TABLET | Freq: Every day | ORAL | Status: AC

## 2023-11-12 MED ORDER — PREGABALIN 25 MG PO CAPS: 25.0000 mg | ORAL_CAPSULE | Freq: Two times a day (BID) | ORAL | 1 refills | Status: DC

## 2023-11-12 MED ORDER — ASPIRIN 81 MG PO TBEC: 81.0000 mg | DELAYED_RELEASE_TABLET | Freq: Every day | ORAL | 12 refills | Status: DC

## 2023-11-12 MED ORDER — ORAL CARE MOUTH RINSE: 15.0000 mL | OROMUCOSAL | Status: DC | PRN

## 2023-11-12 NOTE — Discharge Summary (Signed)
 Physician Discharge Summary   Patient: [name] MRN: [id number] DOB: Aug 07, 1934  Admit date:     11/10/2023  Discharge date: 11/12/23  Discharge Physician: Eric Nunnery   PCP: Lacey Ophelia [name], MD   Recommendations at discharge:  Repeat basic metabolic panel to follow electrolytes and renal function Reassess patient response to Lyrica for neuropathic pain Continue close follow-up of patient's volume status with further adjustment to diuretic regimen as required Repeat CBC to follow hemoglobin trend/stability Reassess blood pressure and adjust medication as needed.  Discharge Diagnoses: Principal Problem:   Bilateral swelling of feet Active Problems:   Bilateral foot pain Dementia with behavioral disturbances Chronic pain Hyperlipidemia Asthma/COPD  Brief Hospital admission narrative: [name] is a 89 y.o. female with medical history significant for generalized anxiety disorder, hyperlipidemia, chronic HFpEF, GERD, insomnia, who presented to the ER via EMS from home where she lives with her daughter due to complaints of both feet and ankles swelling for weeks.  Associated with pain in her feet and discoloration of her toes when she stands on her feet.  No reported fevers or chills.  This evening she was having so much pain in her feet that she could not walk.  EMS was activated.   In the ER, alert in no acute distress.  Denies chest pain or shortness of breath.  Due to persistent symptomatology EDP requesting admission.   ED Course: Temperature 98.2.  BP 134/79, pulse 82, respiration rate 15, O2 saturation 96% on room air.  Assessment and Plan: 1-bilateral swelling of the feet and neuropathic pain - Patient intermittent swelling associated with venous insufficiency most likely - Peripheral arterial disease has been essentially ruled out with normal ABI - Patient and family instructed to keep legs elevated as much as possible and to continue current management with  diuretics as she was previously prescribed; understanding that peripheral swelling do not need to be treated with diuretics necessarily. - For neuropathy she was started on Lyrica with excellent response to symptoms. -Continue patient follow-up with PCP. - There was no discoloration or greater skin toes appreciated throughout hospitalization, bilateral normal temperature and distal pulses appreciated on exam.  2-chronic diastolic heart failure - Continue home regimen - Continue patient follow-up with cardiology service.  3-chronic kidney disease stage IIIb - Appears to be stable and at baseline - Patient's creatinine even better than what she normally has been seen on her blood work as an outpatient. - Continue minimizing nephrotoxic agent - Maintain adequate hydration - Follow heart healthy/low-sodium diet as mentioned above - Continue to follow renal function trend.  4-anemia of chronic disease - No overt bleeding appreciated - Continue to follow hemoglobin trend.  5-generalized weakness - Physical therapy has evaluated patient and recommendation for skilled nursing facility was initially provided; unfortunately patient did not qualify for inpatient status and home health services has been arranged at discharge to facilitate transition of care, conditioning and rehab.  6-dementia with behavioral disorder/insomnia - Resume home medication therapy - Continue reorientation, assistance and supportive care.  7-hyperkalemia - Resolved and is stable at discharge - Patient instructed to only take supplementation when taking extra Lasix  as instructed. - Continue to follow ultralights trend.  8-hyperlipidemia - Continue statin.  Consultants: None Procedures performed: See below for x-ray reports. Disposition: Home with home health services. Diet recommendation: Heart healthy low-sodium diet.  DISCHARGE MEDICATION: Allergies as of 11/12/2023       Reactions   Codeine Anaphylaxis,  Shortness Of Breath, Rash  Neurontin [gabapentin] Anaphylaxis   Sulfonamide Derivatives Hives, Shortness Of Breath   Aloe Other (See Comments)   Unknown reaction   Levaquin [levofloxacin] Other (See Comments)   Pt admitted to hospital with lung problems due to this medication.   Ms Contin  [morphine ] Other (See Comments)   Patient's daughter reports hypoxia requiring O2 with morphine  and similar opiates.  But does okay with tramadol .   Septra [sulfamethoxazole-trimethoprim] Hives   Sulfa Antibiotics Hives   Trimethoprim Hives   Vibra -tab [doxycycline ] Hives        Medication List     TAKE these medications    acetaminophen  500 MG tablet Commonly known as: TYLENOL  Take 1 tablet (500 mg total) by mouth every 8 (eight) hours as needed. What changed: reasons to take this   albuterol  108 (90 Base) MCG/ACT inhaler Commonly known as: ProAir  HFA Inhale 2 puffs into the lungs every 4 (four) hours as needed for wheezing or shortness of breath. What changed:  how much to take how to take this when to take this   ALPRAZolam  0.5 MG tablet Commonly known as: XANAX  Take 1 tablet (0.5 mg total) by mouth 2 (two) times daily.   aspirin  EC 81 MG tablet Take 1 tablet (81 mg total) by mouth daily. Swallow whole. Start taking on: November 13, 2023   Breo Ellipta  100-25 MCG/ACT Aepb Generic drug: fluticasone  furoate-vilanterol Inhale 1 puff into the lungs daily.   CRANBERRY PO Take 1 tablet by mouth daily.   diclofenac  Sodium 1 % Gel Commonly known as: VOLTAREN  APPLY AS DIRECTED TO THE AFFECTED KNEE FOUR TIMES DAILY.   donepezil  10 MG tablet Commonly known as: ARICEPT  Take 10 mg by mouth at bedtime.   furosemide  40 MG tablet Commonly known as: LASIX  Take 1 tablet (40 mg total) by mouth daily. With an extra 40 Mg tablet as needed for swelling, shortness of breath and weight gain.   ipratropium-albuterol  0.5-2.5 (3) MG/3ML Soln Commonly known as: DUONEB Take 3 mLs by  nebulization in the morning, at noon, in the evening, and at bedtime.   Linzess  145 MCG Caps capsule Generic drug: linaclotide  Take 145 mcg by mouth daily as needed (constipation).   loratadine  10 MG tablet Commonly known as: CLARITIN  Take 10 mg by mouth daily.   memantine  10 MG tablet Commonly known as: NAMENDA  Take 10 mg by mouth 2 (two) times daily.   midodrine  5 MG tablet Commonly known as: PROAMATINE  Take 1 tablet (5 mg total) by mouth 2 (two) times daily with a meal.   ONE A DAY WOMEN 50 PLUS PO Take 1 tablet by mouth daily.   pantoprazole  40 MG tablet Commonly known as: PROTONIX  TAKE (1) TABLET BY MOUTH TWICE A DAY WITH MEALS (BREAKFAST AND SUPPER)   potassium chloride  10 MEQ tablet Commonly known as: KLOR-CON  Take 1 tablet (10 mEq total) by mouth daily. When taking extra lasix . What changed: additional instructions   pregabalin 25 MG capsule Commonly known as: LYRICA Take 1 capsule (25 mg total) by mouth 2 (two) times daily.   simvastatin  10 MG tablet Commonly known as: ZOCOR  Take 10 mg by mouth at bedtime.   traMADol  50 MG tablet Commonly known as: ULTRAM  Take 1 tablet (50 mg total) by mouth every 6 (six) hours as needed for moderate pain or severe pain. What changed: when to take this   zolpidem  5 MG tablet Commonly known as: AMBIEN  Take 1 tablet (5 mg total) by mouth at bedtime.  Contact information for follow-up providers     Lacey Ophelia [name], MD. Schedule an appointment as soon as possible for a visit in 10 day(s).   Specialty: Internal Medicine Contact information: [address] [facility] [place] [address] [phone]              Contact information for after-discharge care     Home Medical Care     [facility] [place] [facility]) .   Service: Home Health Services Contact information: [phone] [place]  [address] [phone]                    Discharge  Exam: Filed Weights   11/10/23 2057 11/11/23 0148 11/12/23 0558  Weight: 53.4 kg 50.8 kg 49.1 kg   General exam: Alert, awake, oriented and following commands appropriately.  In no acute distress. Respiratory system: Good saturation on room air. Cardiovascular system: Regular rate and rhythm; no rubs, no gallops, no JVD. Gastrointestinal system: Abdomen is nondistended, soft and nontender. No organomegaly or masses felt. Normal bowel sounds heard. Central nervous system: No focal neurological deficits. Extremities: No cyanosis, clubbing or edema. Skin: No petechiae.;  Left neck lateral posterior area with soft mass appreciated on exam. Psychiatry:  Mood & affect appropriate.    Condition at discharge: Stable.  The results of significant diagnostics from this hospitalization (including imaging, microbiology, ancillary and laboratory) are listed below for reference.   Imaging Studies: US  ARTERIAL ABI (SCREENING LOWER EXTREMITY) Result Date: 11/11/2023 CLINICAL DATA:  Bilateral feet pain and swelling with cyanosis. History hyperlipidemia. EXAM: NONINVASIVE PHYSIOLOGIC VASCULAR STUDY OF BILATERAL LOWER EXTREMITIES TECHNIQUE: Evaluation of both lower extremities were performed at rest, including calculation of ankle-brachial indices with single level Doppler, pressure and pulse volume recording. COMPARISON:  None Available. FINDINGS: Right ABI:  1.08 Left ABI:  0 3 Right Lower Extremity: Monophasic posterior tibial and dorsalis pedis waveforms. Left Lower Extremity: Monophasic posterior tibial and dorsalis pedis waveforms. 0.9-0.99 Borderline PAD IMPRESSION: Normal resting right and mildly depressed left ankle-brachial indices. Bilateral distal monophasic waveforms suggest some degree of tibial disease bilaterally. Electronically Signed   By: Marcey Moan M.D.   On: 11/11/2023 17:18   DG Chest Port 1 View Result Date: 11/10/2023 CLINICAL DATA:  Edema in the bilateral feet, pain with walking  EXAM: PORTABLE CHEST 1 VIEW COMPARISON:  10/20/2023, CT chest 11/27/2022, chest x-ray 05/30/2023, 08/10/2022 FINDINGS: Large hiatal hernia. Stable cardiomediastinal silhouette. Chronic interstitial lung disease with fibrosis and bronchiectasis. No acute confluent airspace disease, pleural effusion, or pneumothorax. Aortic atherosclerosis. Multiple chronic rib fractures. IMPRESSION: No active disease. Chronic interstitial lung disease with fibrosis and bronchiectasis. Large hiatal hernia. Electronically Signed   By: Luke Bun M.D.   On: 11/10/2023 22:40   DG Chest Port 1 View Result Date: 10/20/2023 EXAM: 1 VIEW XRAY OF THE CHEST 10/20/2023 03:36:00 PM COMPARISON: 10/14/2023 CLINICAL HISTORY: sob. Pt came in EMS [facility] with complaints of SHOB, Pt is stating at 97% RA at this time. Pt was recently discharged from [facility] hospital for cellulitis in the leg on amoxicillin . Hx of CHF, COPD, pneumonia. Former smoker FINDINGS: LUNGS AND PLEURA: Grossly stable diffuse interstitial densities most consistent with scarring. No pleural effusion. No pneumothorax. HEART AND MEDIASTINUM: Stable cardiomediastinal silhouette. Stable hiatal hernia. BONES AND SOFT TISSUES: Old left rib fracture is noted. IMPRESSION: 1. Stable diffuse interstitial densities, most consistent with scarring. Electronically signed by: Lynwood Seip MD 10/20/2023 04:01 PM EDT RP Workstation: [id number]  CT HEAD WO CONTRAST ( ) Result Date: 10/14/2023 CLINICAL DATA:  Altered mental status EXAM: CT HEAD WITHOUT CONTRAST TECHNIQUE: Contiguous axial images were obtained from the base of the skull through the vertex without intravenous contrast. RADIATION DOSE REDUCTION: This exam was performed according to the departmental dose-optimization program which includes automated exposure control, adjustment of the mA and/or kV according to patient size and/or use of iterative reconstruction technique. COMPARISON:  05/29/2023 FINDINGS: Brain: No  evidence of acute infarction, hemorrhage, hydrocephalus, extra-axial collection or mass lesion/mass effect. Mild atrophic changes and chronic white matter ischemic changes are noted commenced with the patient's given age. Vascular: No hyperdense vessel or unexpected calcification. Skull: Normal. Negative for fracture or focal lesion. Sinuses/Orbits: No acute finding. Other: Stable subcutaneous mass lesion is noted in the left posterior neck adjacent to the craniocervical junction. This is similar to that seen on prior exam IMPRESSION: Chronic atrophic and ischemic changes without acute abnormality. Stable soft tissue mass in the left posterior neck near the craniocervical junction. This is been present over multiple previous exams. Electronically Signed   By: Oneil Devonshire M.D.   On: 10/14/2023 23:20   DG Chest Portable 1 View Result Date: 10/14/2023 CLINICAL DATA:  Shortness of breath EXAM: PORTABLE CHEST 1 VIEW COMPARISON:  05/30/2023 FINDINGS: Large hiatal hernia. Heart is normal size. Aortic atherosclerosis. Chronic interstitial prominence throughout the lungs compatible with chronic lung disease/fibrosis. No acute confluent airspace opacities. No acute bony abnormality. Multiple old bilateral rib fractures. IMPRESSION: Chronic interstitial lung disease. Large hiatal hernia. No acute cardiopulmonary disease. Electronically Signed   By: Franky Crease M.D.   On: 10/14/2023 22:42   DG Foot Complete Left Result Date: 10/14/2023 CLINICAL DATA:  Left foot pain, no known injury, initial encounter EXAM: LEFT FOOT - COMPLETE 3+ VIEW COMPARISON:  03/02/2022 FINDINGS: Hallux valgus deformity is again noted and stable. No acute fracture or dislocation is noted. Mild osteopenia is seen. No acute fracture is noted. Tarsal degenerative changes are seen. IMPRESSION: Chronic changes without acute abnormality. Electronically Signed   By: Oneil Devonshire M.D.   On: 10/14/2023 22:42    Microbiology: Results for orders placed  or performed during the hospital encounter of 10/20/23  Resp panel by RT-PCR (RSV, Flu A&B, Covid) Anterior Nasal Swab     Status: None   Collection Time: 10/20/23  3:38 PM   Specimen: Anterior Nasal Swab  Result Value Ref Range Status   SARS Coronavirus 2 by RT PCR NEGATIVE NEGATIVE Final    Comment: (NOTE) SARS-CoV-2 target nucleic acids are NOT DETECTED.  The SARS-CoV-2 RNA is generally detectable in upper respiratory specimens during the acute phase of infection. The lowest concentration of SARS-CoV-2 viral copies this assay can detect is 138 copies/mL. A negative result does not preclude SARS-Cov-2 infection and should not be used as the sole basis for treatment or other patient management decisions. A negative result may occur with  improper specimen collection/handling, submission of specimen other than nasopharyngeal swab, presence of viral mutation(s) within the areas targeted by this assay, and inadequate number of viral copies(<138 copies/mL). A negative result must be combined with clinical observations, patient history, and epidemiological information. The expected result is Negative.  Fact Sheet for Patients:  bloggercourse.com  Fact Sheet for Healthcare Providers:  seriousbroker.it  This test is no t yet approved or cleared by the [place]  FDA and  has been authorized for detection and/or diagnosis of SARS-CoV-2 by FDA under an Emergency Use Authorization (EUA). This EUA will  remain  in effect (meaning this test can be used) for the duration of the COVID-19 declaration under Section 564(b)(1) of the Act, 21 U.S.C.section 360bbb-3(b)(1), unless the authorization is terminated  or revoked sooner.       Influenza A by PCR NEGATIVE NEGATIVE Final   Influenza B by PCR NEGATIVE NEGATIVE Final    Comment: (NOTE) The Xpert Xpress SARS-CoV-2/FLU/RSV plus assay is intended as an aid in the diagnosis of influenza from  Nasopharyngeal swab specimens and should not be used as a sole basis for treatment. Nasal washings and aspirates are unacceptable for Xpert Xpress SARS-CoV-2/FLU/RSV testing.  Fact Sheet for Patients: bloggercourse.com  Fact Sheet for Healthcare Providers: seriousbroker.it  This test is not yet approved or cleared by the [place]  FDA and has been authorized for detection and/or diagnosis of SARS-CoV-2 by FDA under an Emergency Use Authorization (EUA). This EUA will remain in effect (meaning this test can be used) for the duration of the COVID-19 declaration under Section 564(b)(1) of the Act, 21 U.S.C. section 360bbb-3(b)(1), unless the authorization is terminated or revoked.     Resp Syncytial Virus by PCR NEGATIVE NEGATIVE Final    Comment: (NOTE) Fact Sheet for Patients: bloggercourse.com  Fact Sheet for Healthcare Providers: seriousbroker.it  This test is not yet approved or cleared by the [place]  FDA and has been authorized for detection and/or diagnosis of SARS-CoV-2 by FDA under an Emergency Use Authorization (EUA). This EUA will remain in effect (meaning this test can be used) for the duration of the COVID-19 declaration under Section 564(b)(1) of the Act, 21 U.S.C. section 360bbb-3(b)(1), unless the authorization is terminated or revoked.  Performed at [facility], [address] [place], [place] [address]     Labs: CBC: Recent Labs  Lab 11/10/23 2127 11/11/23 0418  WBC 6.5 6.5  NEUTROABS 4.2  --   HGB 10.7* 9.9*  HCT 33.9* 30.7*  MCV 90.9 90.0  PLT 235 211   Basic Metabolic Panel: Recent Labs  Lab 11/10/23 2127 11/11/23 0418 11/12/23 0436  NA 136 137 139  K 5.2* 4.5 4.1  CL 97* 99 98  CO2 28 27 29   GLUCOSE 124* 100* 86  BUN 29* 25* 20  CREATININE 1.47* 1.28* 1.15*  CALCIUM  9.5 9.2 9.3   Liver Function Tests: Recent Labs  Lab  11/10/23 2127  AST 28  ALT 16  ALKPHOS 113  BILITOT 0.2  PROT 7.3  ALBUMIN  4.2   CBG: Recent Labs  Lab 11/11/23 1127  GLUCAP 110*    Discharge time spent:  35 minutes.  Signed: Eric Nunnery, MD [facility] 11/12/2023

## 2023-11-12 NOTE — TOC Transition Note (Addendum)
 Transition of Care [facility]) - Discharge Note   Patient Details  Name: [name] MRN: [id number] Date of Birth: 1934/04/28  Transition [facility] CM/SW Contact:  Noreen KATHEE [name] Phone Number: 11/12/2023, 2:46 PM   Clinical Narrative:    Patient is DC home with [facility] services through [facility]. CSW sent referral in HUB and updated [place] regarding orders and DC. CSW attempted to contact daughter and left her a VM about HH. CSW signing off.   Addendum 2:53 pm   Daughter returned CSW called stating that she did not have anything lined up until tomorrow for someone to help her with patient. CSW updated MD  Final next level of care: Home w Home Health Services Barriers to Discharge: Barriers Resolved   Patient Goals and CMS Choice Patient states their goals for this hospitalization and ongoing recovery are:: get stronger CMS Medicare.gov Compare Post Acute Care list provided to:: Patient Choice offered to / list presented to : Patient      Discharge Placement                Patient to be transferred to facility by: POV Name of family member notified: Left daughter a VM Patient and family notified of of transfer: 11/12/23  Discharge Plan and Services Additional resources added to the After Visit Summary for   In-house Referral: Clinical Social Work Discharge Planning Services: CM Consult Post Acute Care Choice: Home Health                    HH Arranged: PT, RN [facility] Agency: [facility] Date HH Agency Contacted: 11/12/23 Time HH Agency Contacted: 1446 Representative spoke with at [facility] [place] Agency: [facility]  Social Drivers of Health (SDOH) Interventions SDOH Screenings   Food Insecurity: Unknown (11/11/2023)  Housing: Low Risk  (11/11/2023)  Transportation Needs: No Transportation Needs (11/11/2023)  Utilities: Not At Risk (11/11/2023)  Financial Resource Strain: Low Risk  (08/11/2023)   Received from [facility]  Social  Connections: Patient Declined (11/11/2023)  Tobacco Use: High Risk (11/11/2023)     Readmission Risk Interventions    11/29/2022    1:31 PM 07/26/2022   12:28 PM 09/30/2021    9:22 AM  Readmission Risk Prevention Plan  Transportation Screening Complete Complete Complete  PCP or Specialist Appt within 5-7 Days  Not Complete   Home Care Screening  Complete   Medication Review (RN CM)  Complete   Medication Review Ocean[facility]) Complete  Complete  HRI or Home Care Consult Complete  Complete  SW Recovery Care/Counseling Consult Complete  Complete  Palliative Care Screening Not Applicable  Not Applicable  Skilled Nursing Facility Not Applicable  Complete

## 2023-11-12 NOTE — Progress Notes (Signed)
 This nurse spoke to patient's daughter, [name], at length discussing patient's discharge. [name] explained to this nurse she recently had back surgery and was under lifting precautions. She reports she was unaware patient would be discharged today and that she was unable to arrange [facility] aide to come help and [name]'s brother was unable to come assist due to being out of town. Dr. Ricky and Noreen, LCSW made aware. Daughter reports to this nurse that she will be able to pick the patient up on 11/9 about lunchtime.

## 2023-11-12 NOTE — Plan of Care (Signed)
  Problem: Health Behavior/Discharge Planning: Goal: Ability to manage health-related needs will improve Outcome: Progressing   Problem: Clinical Measurements: Goal: Will remain free from infection Outcome: Progressing   Problem: Activity: Goal: Risk for activity intolerance will decrease Outcome: Progressing   Problem: Coping: Goal: Level of anxiety will decrease Outcome: Progressing   Problem: Elimination: Goal: Will not experience complications related to bowel motility Outcome: Progressing   Problem: Pain Managment: Goal: General experience of comfort will improve and/or be controlled Outcome: Progressing   Problem: Safety: Goal: Ability to remain free from injury will improve Outcome: Progressing   Problem: Skin Integrity: Goal: Risk for impaired skin integrity will decrease Outcome: Progressing

## 2023-11-13 DIAGNOSIS — M7989 Other specified soft tissue disorders: Secondary | ICD-10-CM | POA: Diagnosis not present

## 2023-11-13 NOTE — Plan of Care (Signed)
  Problem: Clinical Measurements: Goal: Will remain free from infection Outcome: Progressing   Problem: Activity: Goal: Risk for activity intolerance will decrease Outcome: Progressing   Problem: Coping: Goal: Level of anxiety will decrease Outcome: Progressing   Problem: Elimination: Goal: Will not experience complications related to bowel motility Outcome: Progressing Goal: Will not experience complications related to urinary retention Outcome: Progressing   Problem: Pain Managment: Goal: General experience of comfort will improve and/or be controlled Outcome: Progressing   Problem: Safety: Goal: Ability to remain free from injury will improve Outcome: Progressing   Problem: Skin Integrity: Goal: Risk for impaired skin integrity will decrease Outcome: Progressing

## 2023-11-14 ENCOUNTER — Telehealth (HOSPITAL_COMMUNITY): Payer: Self-pay | Admitting: Pharmacy Technician

## 2023-11-14 ENCOUNTER — Other Ambulatory Visit (HOSPITAL_COMMUNITY): Payer: Self-pay

## 2023-11-14 NOTE — Telephone Encounter (Signed)
 Pharmacy Patient Advocate Encounter   Received notification from Fax that prior authorization for Pregabalin 25 mg capsules is required/requested.   Insurance verification completed.   The patient is insured through [facility].   Per test claim: PA required; PA submitted to above mentioned insurance via Latent Key/confirmation #/EOC [facility] Status is pending

## 2023-11-15 NOTE — Telephone Encounter (Signed)
 Pharmacy Patient Advocate Encounter  Received notification from [employer] that Prior Authorization for Pregabalin 25 MG capsules has been DENIED.  Full denial letter will be uploaded to the media tab. See denial reason below.   PA #/Case ID/Reference #: [id number]

## 2023-12-13 ENCOUNTER — Emergency Department

## 2023-12-13 ENCOUNTER — Other Ambulatory Visit: Payer: Self-pay

## 2023-12-13 ENCOUNTER — Ambulatory Visit: Admitting: Orthopedic Surgery

## 2023-12-13 ENCOUNTER — Inpatient Hospital Stay
Admission: EM | Admit: 2023-12-13 | Discharge: 2023-12-18 | DRG: 292 | Disposition: A | Discharge status: Hospice | Attending: Internal Medicine | Admitting: Internal Medicine

## 2023-12-13 DIAGNOSIS — D72829 Elevated white blood cell count, unspecified: Secondary | ICD-10-CM | POA: Diagnosis present

## 2023-12-13 DIAGNOSIS — R531 Weakness: Secondary | ICD-10-CM | POA: Diagnosis present

## 2023-12-13 DIAGNOSIS — I5043 Acute on chronic combined systolic (congestive) and diastolic (congestive) heart failure: Secondary | ICD-10-CM | POA: Diagnosis present

## 2023-12-13 DIAGNOSIS — G4700 Insomnia, unspecified: Secondary | ICD-10-CM | POA: Diagnosis present

## 2023-12-13 DIAGNOSIS — Z66 Do not resuscitate: Secondary | ICD-10-CM | POA: Diagnosis present

## 2023-12-13 DIAGNOSIS — I959 Hypotension, unspecified: Secondary | ICD-10-CM | POA: Diagnosis present

## 2023-12-13 DIAGNOSIS — J440 Chronic obstructive pulmonary disease with acute lower respiratory infection: Secondary | ICD-10-CM | POA: Diagnosis present

## 2023-12-13 DIAGNOSIS — J449 Chronic obstructive pulmonary disease, unspecified: Secondary | ICD-10-CM | POA: Diagnosis not present

## 2023-12-13 DIAGNOSIS — F418 Other specified anxiety disorders: Secondary | ICD-10-CM | POA: Diagnosis not present

## 2023-12-13 DIAGNOSIS — G8929 Other chronic pain: Secondary | ICD-10-CM | POA: Diagnosis present

## 2023-12-13 DIAGNOSIS — Z79899 Other long term (current) drug therapy: Secondary | ICD-10-CM | POA: Diagnosis not present

## 2023-12-13 DIAGNOSIS — N1832 Chronic kidney disease, stage 3b: Secondary | ICD-10-CM | POA: Diagnosis present

## 2023-12-13 DIAGNOSIS — F0394 Unspecified dementia, unspecified severity, with anxiety: Secondary | ICD-10-CM | POA: Diagnosis present

## 2023-12-13 DIAGNOSIS — N390 Urinary tract infection, site not specified: Secondary | ICD-10-CM | POA: Diagnosis present

## 2023-12-13 DIAGNOSIS — B9620 Unspecified Escherichia coli [E. coli] as the cause of diseases classified elsewhere: Secondary | ICD-10-CM | POA: Diagnosis present

## 2023-12-13 DIAGNOSIS — N3000 Acute cystitis without hematuria: Secondary | ICD-10-CM | POA: Diagnosis not present

## 2023-12-13 DIAGNOSIS — R221 Localized swelling, mass and lump, neck: Secondary | ICD-10-CM | POA: Diagnosis present

## 2023-12-13 DIAGNOSIS — E785 Hyperlipidemia, unspecified: Secondary | ICD-10-CM | POA: Diagnosis present

## 2023-12-13 DIAGNOSIS — J9601 Acute respiratory failure with hypoxia: Secondary | ICD-10-CM | POA: Diagnosis not present

## 2023-12-13 DIAGNOSIS — F0390 Unspecified dementia without behavioral disturbance: Secondary | ICD-10-CM | POA: Diagnosis not present

## 2023-12-13 DIAGNOSIS — Z1152 Encounter for screening for COVID-19: Secondary | ICD-10-CM | POA: Diagnosis not present

## 2023-12-13 DIAGNOSIS — I5A Non-ischemic myocardial injury (non-traumatic): Secondary | ICD-10-CM | POA: Diagnosis present

## 2023-12-13 DIAGNOSIS — I5033 Acute on chronic diastolic (congestive) heart failure: Secondary | ICD-10-CM | POA: Diagnosis present

## 2023-12-13 DIAGNOSIS — I2489 Other forms of acute ischemic heart disease: Secondary | ICD-10-CM | POA: Diagnosis present

## 2023-12-13 DIAGNOSIS — J8410 Pulmonary fibrosis, unspecified: Secondary | ICD-10-CM | POA: Diagnosis present

## 2023-12-13 DIAGNOSIS — R627 Adult failure to thrive: Secondary | ICD-10-CM | POA: Diagnosis present

## 2023-12-13 DIAGNOSIS — R0902 Hypoxemia: Secondary | ICD-10-CM | POA: Diagnosis present

## 2023-12-13 DIAGNOSIS — Z7189 Other specified counseling: Secondary | ICD-10-CM | POA: Diagnosis not present

## 2023-12-13 DIAGNOSIS — K219 Gastro-esophageal reflux disease without esophagitis: Secondary | ICD-10-CM | POA: Diagnosis present

## 2023-12-13 DIAGNOSIS — Z7982 Long term (current) use of aspirin: Secondary | ICD-10-CM | POA: Diagnosis not present

## 2023-12-13 DIAGNOSIS — F32A Depression, unspecified: Secondary | ICD-10-CM | POA: Diagnosis present

## 2023-12-13 DIAGNOSIS — F0393 Unspecified dementia, unspecified severity, with mood disturbance: Secondary | ICD-10-CM | POA: Diagnosis present

## 2023-12-13 DIAGNOSIS — J209 Acute bronchitis, unspecified: Secondary | ICD-10-CM | POA: Diagnosis present

## 2023-12-13 DIAGNOSIS — Z515 Encounter for palliative care: Secondary | ICD-10-CM | POA: Diagnosis not present

## 2023-12-13 DIAGNOSIS — I5031 Acute diastolic (congestive) heart failure: Secondary | ICD-10-CM | POA: Diagnosis not present

## 2023-12-13 LAB — CBC
HCT: 32.1 % — ABNORMAL LOW (ref 36.0–46.0)
Hemoglobin: 10.4 g/dL — ABNORMAL LOW (ref 12.0–15.0)
MCH: 28.2 pg (ref 26.0–34.0)
MCHC: 32.4 g/dL (ref 30.0–36.0)
MCV: 87 fL (ref 80.0–100.0)
Platelets: 218 K/uL (ref 150–400)
RBC: 3.69 MIL/uL — ABNORMAL LOW (ref 3.87–5.11)
RDW: 13.2 % (ref 11.5–15.5)
WBC: 19.4 K/uL — ABNORMAL HIGH (ref 4.0–10.5)
nRBC: 0 % (ref 0.0–0.2)

## 2023-12-13 LAB — BASIC METABOLIC PANEL WITH GFR
Anion gap: 17 — ABNORMAL HIGH (ref 5–15)
BUN: 34 mg/dL — ABNORMAL HIGH (ref 8–23)
CO2: 21 mmol/L — ABNORMAL LOW (ref 22–32)
Calcium: 8.6 mg/dL — ABNORMAL LOW (ref 8.9–10.3)
Chloride: 93 mmol/L — ABNORMAL LOW (ref 98–111)
Creatinine, Ser: 1.49 mg/dL — ABNORMAL HIGH (ref 0.44–1.00)
GFR, Estimated: 34 mL/min — ABNORMAL LOW (ref >60)
Glucose, Bld: 81 mg/dL (ref 70–99)
Potassium: 4.8 mmol/L (ref 3.5–5.1)
Sodium: 130 mmol/L — ABNORMAL LOW (ref 135–145)

## 2023-12-13 LAB — URINALYSIS, W/ REFLEX TO CULTURE (INFECTION SUSPECTED)
Bilirubin Urine: NEGATIVE
Glucose, UA: NEGATIVE mg/dL
Ketones, ur: 5 mg/dL — AB
Nitrite: NEGATIVE
Protein, ur: 100 mg/dL — AB
Specific Gravity, Urine: 1.013 (ref 1.005–1.030)
WBC, UA: >50 WBC/hpf (ref 0–5)
pH: 5 (ref 5.0–8.0)

## 2023-12-13 LAB — MAGNESIUM: Magnesium: 2.1 mg/dL (ref 1.7–2.4)

## 2023-12-13 LAB — TROPONIN T, HIGH SENSITIVITY
Troponin T High Sensitivity: 34 ng/L — ABNORMAL HIGH (ref 0–19)
Troponin T High Sensitivity: 29 ng/L — ABNORMAL HIGH (ref 0–19)

## 2023-12-13 LAB — PRO BRAIN NATRIURETIC PEPTIDE: Pro Brain Natriuretic Peptide: 6019 pg/mL — ABNORMAL HIGH (ref <300.0)

## 2023-12-13 LAB — LACTIC ACID, PLASMA: Lactic Acid, Venous: 0.9 mmol/L (ref 0.5–1.9)

## 2023-12-13 MED ORDER — SIMVASTATIN 20 MG PO TABS
10.0000 mg | ORAL_TABLET | Freq: Every day | ORAL | Status: DC
  Administered 2023-12-14 – 2023-12-17 (×4): 10 mg via ORAL
  Filled 2023-12-13 (×4): qty 1

## 2023-12-13 MED ORDER — LACTATED RINGERS IV SOLN: INTRAVENOUS | Status: DC

## 2023-12-13 MED ORDER — ALBUTEROL SULFATE (2.5 MG/3ML) 0.083% IN NEBU
2.5000 mg | INHALATION_SOLUTION | Freq: Four times a day (QID) | RESPIRATORY_TRACT | Status: DC
  Administered 2023-12-13: 2.5 mg via RESPIRATORY_TRACT
  Filled 2023-12-13: qty 3

## 2023-12-13 MED ORDER — ONDANSETRON HCL 4 MG/2ML IJ SOLN: 4.0000 mg | Freq: Three times a day (TID) | INTRAMUSCULAR | Status: AC | PRN

## 2023-12-13 MED ORDER — ACETAMINOPHEN 325 MG PO TABS
650.0000 mg | ORAL_TABLET | Freq: Four times a day (QID) | ORAL | Status: AC | PRN
  Administered 2023-12-14 – 2023-12-17 (×2): 650 mg via ORAL
  Filled 2023-12-13 (×3): qty 2

## 2023-12-13 MED ORDER — VANCOMYCIN HCL IN DEXTROSE 1-5 GM/200ML-% IV SOLN
1000.0000 mg | Freq: Once | INTRAVENOUS | Status: AC
  Administered 2023-12-13: 1000 mg via INTRAVENOUS
  Filled 2023-12-13: qty 200

## 2023-12-13 MED ORDER — LORATADINE 10 MG PO TABS
10.0000 mg | ORAL_TABLET | Freq: Every day | ORAL | Status: DC
  Administered 2023-12-14 – 2023-12-18 (×5): 10 mg via ORAL
  Filled 2023-12-13 (×5): qty 1

## 2023-12-13 MED ORDER — LACTATED RINGERS IV BOLUS (SEPSIS)
1000.0000 mL | Freq: Once | INTRAVENOUS | Status: AC
  Administered 2023-12-13: 1000 mL via INTRAVENOUS

## 2023-12-13 MED ORDER — IPRATROPIUM-ALBUTEROL 0.5-2.5 (3) MG/3ML IN SOLN
3.0000 mL | RESPIRATORY_TRACT | Status: DC
  Administered 2023-12-13: 3 mL via RESPIRATORY_TRACT
  Filled 2023-12-13: qty 3

## 2023-12-13 MED ORDER — LINACLOTIDE 145 MCG PO CAPS: 145.0000 ug | ORAL_CAPSULE | Freq: Every day | ORAL | Status: DC | PRN

## 2023-12-13 MED ORDER — ADULT MULTIVITAMIN W/MINERALS CH
ORAL_TABLET | Freq: Every day | ORAL | Status: DC
  Administered 2023-12-14 – 2023-12-18 (×5): 1 via ORAL
  Filled 2023-12-13 (×5): qty 1

## 2023-12-13 MED ORDER — IPRATROPIUM-ALBUTEROL 0.5-2.5 (3) MG/3ML IN SOLN: 3.0000 mL | RESPIRATORY_TRACT | Status: DC | PRN

## 2023-12-13 MED ORDER — PANTOPRAZOLE SODIUM 40 MG PO TBEC
40.0000 mg | DELAYED_RELEASE_TABLET | Freq: Two times a day (BID) | ORAL | Status: DC
  Administered 2023-12-13 – 2023-12-18 (×10): 40 mg via ORAL
  Filled 2023-12-13 (×10): qty 1

## 2023-12-13 MED ORDER — ZOLPIDEM TARTRATE 5 MG PO TABS
5.0000 mg | ORAL_TABLET | Freq: Every evening | ORAL | Status: DC | PRN
  Administered 2023-12-13 – 2023-12-18 (×4): 5 mg via ORAL
  Filled 2023-12-13 (×4): qty 1

## 2023-12-13 MED ORDER — MIDODRINE HCL 5 MG PO TABS
10.0000 mg | ORAL_TABLET | Freq: Three times a day (TID) | ORAL | Status: DC
  Administered 2023-12-13 – 2023-12-15 (×7): 10 mg via ORAL
  Filled 2023-12-13 (×7): qty 2

## 2023-12-13 MED ORDER — ALBUTEROL SULFATE HFA 108 (90 BASE) MCG/ACT IN AERS: 2.0000 | INHALATION_SPRAY | RESPIRATORY_TRACT | Status: DC | PRN

## 2023-12-13 MED ORDER — OXYCODONE HCL 5 MG PO TABS
5.0000 mg | ORAL_TABLET | Freq: Four times a day (QID) | ORAL | Status: DC | PRN
  Administered 2023-12-14 – 2023-12-16 (×3): 5 mg via ORAL
  Filled 2023-12-13 (×4): qty 1

## 2023-12-13 MED ORDER — DM-GUAIFENESIN ER 30-600 MG PO TB12: 1.0000 | ORAL_TABLET | Freq: Two times a day (BID) | ORAL | Status: DC | PRN

## 2023-12-13 MED ORDER — DONEPEZIL HCL 5 MG PO TABS
10.0000 mg | ORAL_TABLET | Freq: Every day | ORAL | Status: DC
  Administered 2023-12-13 – 2023-12-17 (×5): 10 mg via ORAL
  Filled 2023-12-13 (×6): qty 2

## 2023-12-13 MED ORDER — MEMANTINE HCL 5 MG PO TABS
10.0000 mg | ORAL_TABLET | Freq: Two times a day (BID) | ORAL | Status: DC
  Administered 2023-12-13 – 2023-12-18 (×10): 10 mg via ORAL
  Filled 2023-12-13 (×2): qty 1
  Filled 2023-12-13: qty 2
  Filled 2023-12-13 (×3): qty 1
  Filled 2023-12-13: qty 2
  Filled 2023-12-13 (×2): qty 1
  Filled 2023-12-13: qty 2

## 2023-12-13 MED ORDER — ENOXAPARIN SODIUM 30 MG/0.3ML IJ SOSY
30.0000 mg | PREFILLED_SYRINGE | Freq: Every day | INTRAMUSCULAR | Status: DC
  Administered 2023-12-13 – 2023-12-17 (×5): 30 mg via SUBCUTANEOUS
  Filled 2023-12-13 (×5): qty 0.3

## 2023-12-13 MED ORDER — SODIUM CHLORIDE 0.9 % IV SOLN
2.0000 g | Freq: Once | INTRAVENOUS | Status: AC
  Administered 2023-12-13: 2 g via INTRAVENOUS
  Filled 2023-12-13: qty 12.5

## 2023-12-13 NOTE — ED Triage Notes (Signed)
 Pt reports overall not feeling well for a few days with generalized pain. NAD noted.

## 2023-12-13 NOTE — ED Notes (Signed)
 AAOx3. Skin warm and dry. NAD. Patient states I don't feel well.

## 2023-12-13 NOTE — Consult Note (Signed)
 PHARMACIST - PHYSICIAN COMMUNICATION  CONCERNING:  Enoxaparin  (Lovenox ) for DVT Prophylaxis    RECOMMENDATION: Patient was prescribed enoxaprin 40mg  q24 hours for VTE prophylaxis.   Filed Weights   12/13/23 1431  Weight: 49.9 kg (110 lb)    Body mass index is 20.78 kg/m.  Estimated Creatinine Clearance: 20.1 mL/min (A) (by C-G formula based on SCr of 1.49 mg/dL (H)).   Patient is candidate for enoxaparin  30mg  every 24 hours based on CrCl <28ml/min or Weight <45kg  DESCRIPTION: Pharmacy has adjusted enoxaparin  dose per [facility] policy.  Patient is now receiving enoxaparin  30 mg every 24 hours    Annabella LOISE Banks, PharmD Clinical Pharmacist  12/13/2023 7:31 PM

## 2023-12-13 NOTE — ED Notes (Signed)
 Pt in CT.

## 2023-12-13 NOTE — ED Notes (Signed)
 Legal guardian, [name], notified of admission of pt to room 240

## 2023-12-13 NOTE — Progress Notes (Signed)
 [facility] Liaison Note  This patient is currently followed by [facility].  [facility] will follow through discharge disposition.  Please call with any hospice related questions.  [facility] Liasion  [phone]

## 2023-12-13 NOTE — ED Triage Notes (Signed)
 BIB by [facility].  Hospice nurse called EMS.  Called for overall health decline and weakness x 2 days.  92/54 84 97% 3l/ [place] home oxygen 97.6  AAOx3. HOH.

## 2023-12-13 NOTE — ED Provider Notes (Signed)
 [facility] Provider Note    Event Date/Time   First MD Initiated Contact with Patient 12/13/23 1503     (approximate)   History   Failure To Thrive   HPI  [name] is a 89 y.o. female with a history of hyperlipidemia, HFpEF, GERD, insomnia, and anxiety who presents with acute onset over the last 2 days of weakness, as well as low blood pressure noted today.  The daughter reports that the patient is on hospice, and has had some gradual decline over the last few months, but in the last 2 days acutely became much weaker.  She is no longer able to feed herself.  She needs to be picked up and carried to be moved, and has not been feeling well.  In addition, when the hospice nurse came today, she noted that the patient had a low blood pressure.  The patient has also been leaning towards the right and having difficulty moving her right leg.  The family's concern for an infection, sepsis, or stroke.  The patient denies any acute pain now.  I reviewed the past medical records.  The patient was most recently admitted to the hospital service last month with lower extremity swelling and neuropathic pain.   Physical Exam   Triage Vital Signs: ED Triage Vitals  Encounter Vitals Group     BP 12/13/23 1423 (!) 78/56     Girls Systolic BP Percentile --      Girls Diastolic BP Percentile --      Boys Systolic BP Percentile --      Boys Diastolic BP Percentile --      Pulse Rate 12/13/23 1423 66     Resp 12/13/23 1423 16     Temp 12/13/23 1423 99.4 F (37.4 C)     Temp Source 12/13/23 1423 Oral     SpO2 12/13/23 1423 98 %     Weight 12/13/23 1431 110 lb (49.9 kg)     Height 12/13/23 1431 5' 1 (1.549 m)     Head Circumference --      Peak Flow --      Pain Score 12/13/23 1431 5     Pain Loc --      Pain Education --      Exclude from Growth Chart --     Most recent vital signs: Vitals:   12/13/23 1815 12/13/23 1825  BP:    Pulse: 88 87  Resp: 17 17   Temp:    SpO2: 98% 100%    General: Alert and oriented, weak appearing, no distress.  CV:  Good peripheral perfusion.  Resp:  Normal effort.  Lungs CTAB. Abd:  No distention.  Other:  Left neck mass with no erythema, induration, warmth, or tenderness.  EOMI.  PERRLA.  No facial droop.  Normal speech.  Motor intact in all extremities.  No significant peripheral edema.  Dry mucous membranes.   ED Results / Procedures / Treatments   Labs (all labs ordered are listed, but only abnormal results are displayed) Labs Reviewed  CBC - Abnormal; Notable for the following components:      Result Value   WBC 19.4 (*)    RBC 3.69 (*)    Hemoglobin 10.4 (*)    HCT 32.1 (*)    All other components within normal limits  BASIC METABOLIC PANEL WITH GFR - Abnormal; Notable for the following components:   Sodium 130 (*)    Chloride 93 (*)  CO2 21 (*)    BUN 34 (*)    Creatinine, Ser 1.49 (*)    Calcium  8.6 (*)    GFR, Estimated 34 (*)    Anion gap 17 (*)    All other components within normal limits  PRO BRAIN NATRIURETIC PEPTIDE - Abnormal; Notable for the following components:   Pro Brain Natriuretic Peptide 6,019.0 (*)    All other components within normal limits  URINALYSIS, W/ REFLEX TO CULTURE (INFECTION SUSPECTED) - Abnormal; Notable for the following components:   Color, Urine YELLOW (*)    APPearance CLOUDY (*)    Hgb urine dipstick SMALL (*)    Ketones, ur 5 (*)    Protein, ur 100 (*)    Leukocytes,Ua MODERATE (*)    Bacteria, UA MANY (*)    All other components within normal limits  TROPONIN T, HIGH SENSITIVITY - Abnormal; Notable for the following components:   Troponin T High Sensitivity 34 (*)    All other components within normal limits  TROPONIN T, HIGH SENSITIVITY - Abnormal; Notable for the following components:   Troponin T High Sensitivity 29 (*)    All other components within normal limits  RESP PANEL BY RT-PCR (RSV, FLU A&B, COVID)  RVPGX2  CULTURE, BLOOD  (ROUTINE X 2)  CULTURE, BLOOD (ROUTINE X 2)  URINE CULTURE  LACTIC ACID, PLASMA  LACTIC ACID, PLASMA  MAGNESIUM      EKG  ED ECG REPORT I, Waylon Cassis, the attending physician, personally viewed and interpreted this ECG.  Date: 12/13/2023 EKG Time: 1600 Rate: 106 Rhythm: Atrial fibrillation QRS Axis: Right axis Intervals: normal ST/T Wave abnormalities: normal Narrative Interpretation: no evidence of acute ischemia    RADIOLOGY  Chest x-ray: I independently viewed and interpreted the images; there are bilateral interstitial opacities, possible CHF versus atypical pneumonia  CT head: No acute intracranial abnormalities   PROCEDURES:  Critical Care performed: Yes, see critical care procedure note(s)  .Critical Care  Performed by: Cassis Waylon, MD Authorized by: Cassis Waylon, MD   Critical care provider statement:    Critical care time (minutes):  30   Critical care time was exclusive of:  Separately billable procedures and treating other patients   Critical care was necessary to treat or prevent imminent or life-threatening deterioration of the following conditions:  Sepsis and respiratory failure   Critical care was time spent personally by me on the following activities:  Development of treatment plan with patient or surrogate, discussions with consultants, evaluation of patient's response to treatment, examination of patient, ordering and review of laboratory studies, ordering and review of radiographic studies, ordering and performing treatments and interventions, pulse oximetry, re-evaluation of patient's condition, review of old charts and obtaining history from patient or surrogate   Care discussed with: admitting provider      MEDICATIONS ORDERED IN ED: Medications  vancomycin  (VANCOCIN ) IVPB 1000 mg/200 mL premix (1,000 mg Intravenous New Bag/Given 12/13/23 1833)  albuterol  (PROVENTIL ) (2.5 MG/3ML) 0.083% nebulizer solution 2.5 mg (has no  administration in time range)  midodrine  (PROAMATINE ) tablet 10 mg (has no administration in time range)  albuterol  (VENTOLIN  HFA) 108 (90 Base) MCG/ACT inhaler 2 puff (has no administration in time range)  dextromethorphan -guaiFENesin  (MUCINEX  DM) 30-600 MG per 12 hr tablet 1 tablet (has no administration in time range)  ondansetron  (ZOFRAN ) injection 4 mg (has no administration in time range)  acetaminophen  (TYLENOL ) tablet 650 mg (has no administration in time range)  lactated ringers  bolus 1,000 mL (0 mLs Intravenous  Stopped 12/13/23 1722)  ceFEPIme  (MAXIPIME ) 2 g in sodium chloride  0.9 % 100 mL IVPB (2 g Intravenous New Bag/Given 12/13/23 1722)     IMPRESSION / MDM / ASSESSMENT AND PLAN / ED COURSE  I reviewed the triage vital signs and the nursing notes.  89 year old female with PMH as noted above, currently on hospice, presents with an acute decline over the last 2 days with generalized weakness, possible right-sided weakness, inability to feed herself, and low blood pressure noted this morning.  On exam the patient was initially hypotensive although the blood pressure has improved slightly.  Temperature is borderline elevated.  Other vital signs are normal.  Neurologic exam is nonfocal.  Mucous membranes are dry.  Differential diagnosis includes, but is not limited to, dehydration, electrolyte abnormality, AKI, other metabolic cause, acute infection, sepsis, less likely CVA or other acute CNS cause.  The family would like workup for these possible etiologies.  We will obtain labs, CT head, chest x-ray, give a fluid bolus, and reassess.  Patient's presentation is most consistent with acute presentation with potential threat to life or bodily function.  The patient is on the cardiac monitor to evaluate for evidence of arrhythmia and/or significant heart rate changes.  ----------------------------------------- 6:32 PM on 12/13/2023 -----------------------------------------  CT head is  negative for acute findings.  Chest x-ray shows bilateral interstitial opacities, concerning for possible CHF versus pneumonia.  Lab workup is significant for a WBC count of 19.  Lactic acid is not elevated.  Urinalysis shows findings consistent with a UTI.  BNP is significantly elevated.  Blood pressure is normalized after the first liter of fluid.  I have ordered broad-spectrum antibiotics to cover both for UTI and pneumonia.  The patient and family would like the patient to be treated for reversible etiologies of her symptoms and agree with admission to the hospital.  I consulted Dr. Hilma from the hospitalist service; based on our discussion he agrees to evaluate the patient for admission.    FINAL CLINICAL IMPRESSION(S) / ED DIAGNOSES   Final diagnoses:  Weakness  Urinary tract infection without hematuria, site unspecified  Acute respiratory failure with hypoxia (HCC)     Rx / DC Orders   ED Discharge Orders     None        Note:  This document was prepared using Dragon voice recognition software and may include unintentional dictation errors.    Jacolyn Pae, MD 12/13/23 [phone]

## 2023-12-13 NOTE — H&P (Incomplete)
 History and Physical    [name] FMW:[id number] DOB: Apr 26, 1934 DOA: 12/13/2023  Referring MD/NP/PA:   PCP: [name], MD   Patient coming from:  The patient is coming from home.     Chief Complaint:   HPI: [name] is a 89 y.o. female with medical history significant of      Data reviewed independently and ED Course: pt was found to have     ***       EKG: I have personally reviewed.  Not done in ED, will get one.   ***   Review of Systems:   General: no fevers, chills, no body weight gain, has poor appetite, has fatigue HEENT: no blurry vision, hearing changes or sore throat Respiratory: no dyspnea, coughing, wheezing CV: no chest pain, no palpitations GI: no nausea, vomiting, abdominal pain, diarrhea, constipation GU: no dysuria, burning on urination, increased urinary frequency, hematuria  Ext: no leg edema Neuro: no unilateral weakness, numbness, or tingling, no vision change or hearing loss Skin: no rash, no skin tear. MSK: No muscle spasm, no deformity, no limitation of range of movement in spin Heme: No easy bruising.  Travel history: No recent long distant travel.   Allergy:  Allergies  Allergen Reactions   Codeine Anaphylaxis, Shortness Of Breath and Rash   Neurontin [Gabapentin] Anaphylaxis   Sulfonamide Derivatives Hives and Shortness Of Breath   Aloe Other (See Comments)    Unknown reaction   Levaquin [Levofloxacin] Other (See Comments)    Pt admitted to hospital with lung problems due to this medication.   Ms Contin  [Morphine ] Other (See Comments)    Patient's daughter reports hypoxia requiring O2 with morphine  and similar opiates.  But does okay with tramadol .   Septra [Sulfamethoxazole-Trimethoprim] Hives   Sulfa Antibiotics Hives   Trimethoprim Hives   Vibra -Tab [Doxycycline ] Hives    Past Medical History:  Diagnosis Date   Anxiety    Chronic diastolic (congestive) heart failure (HCC)    Chronic obstructive pulmonary  disease, unspecified (HCC)    COPD (chronic obstructive pulmonary disease) (HCC)    GERD (gastroesophageal reflux disease)    Hyperlipidemia    IBS (irritable bowel syndrome)    Lumbago with sciatica    Osteoporosis    Overactive bladder    Pneumonia    Pulmonary fibrosis (HCC)     Past Surgical History:  Procedure Laterality Date   COLONOSCOPY  10/04/2009   sigmoid diverticula, multiple tubulovillous adneomas, needs surveillance Oct 2013   ESOPHAGOGASTRODUODENOSCOPY  04/16/2011   Dr. Shaaron: Noncritical Schatzkis ring( not manipulated because no dysphagia). Normal esophagus otherwise  large hiatal hernia. Gastric polyp-status post biopsy. Gastric erosions-staus post biopsy. Abnormal bulb-status post biopsy. benign small bowel, stomach biopsy with ulcerated gastric antral mucosa with foveolar hyperplasia and surface erosion, polyp with inflamed gastric antral mucosa    ESOPHAGOGASTRODUODENOSCOPY N/A 04/29/2014   Dr. Shaaron: Noncritical Schatzki's ring large hiatal hernia. Retained gastric contents.GES with slight delay   femur fracture Left 2023   HIP ARTHROPLASTY Left 09/24/2021   Procedure: ARTHROPLASTY BIPOLAR HIP (HEMIARTHROPLASTY);  Surgeon: Onesimo Oneil LABOR, MD;  Location: [facility];  Service: Orthopedics;  Laterality: Left;   HIP PINNING,CANNULATED Right 04/06/2018   Procedure: CANNULATED HIP PINNING;  Surgeon: Margrette Taft BRAVO, MD;  Location: [facility];  Service: Orthopedics;  Laterality: Right;    Social History:  reports that she has quit smoking. Her smoking use included cigarettes. She has been exposed to tobacco smoke. Her smokeless tobacco  use includes snuff. She reports that she does not drink alcohol and does not use drugs.  Family History:  Family History  Problem Relation Age of Onset   Stroke Mother    Diabetes Mother    Heart disease Father    Colon cancer Neg Hx      Prior to Admission medications   Medication Sig Start Date End Date Taking? Authorizing Provider   acetaminophen  (TYLENOL ) 500 MG tablet Take 1 tablet (500 mg total) by mouth every 8 (eight) hours as needed. Patient taking differently: Take 500 mg by mouth every 8 (eight) hours as needed for mild pain (pain score 1-3) or moderate pain (pain score 4-6). 08/26/22   Patsey Lot, MD  albuterol  (PROAIR  HFA) 108 (90 Base) MCG/ACT inhaler Inhale 2 puffs into the lungs every 4 (four) hours as needed for wheezing or shortness of breath. Patient taking differently: No sig reported 06/12/21   Vicci Afton CROME, MD  ALPRAZolam  (XANAX ) 0.5 MG tablet Take 1 tablet (0.5 mg total) by mouth 2 (two) times daily. 07/29/22   Evonnie Lenis, MD  aspirin  EC 81 MG tablet Take 1 tablet (81 mg total) by mouth daily. Swallow whole. 11/13/23   Ricky Fines, MD  CRANBERRY PO Take 1 tablet by mouth daily.    [provider]  diclofenac  Sodium (VOLTAREN ) 1 % GEL APPLY AS DIRECTED TO THE AFFECTED KNEE FOUR TIMES DAILY. 08/04/20   Margrette Taft BRAVO, MD  donepezil  (ARICEPT ) 10 MG tablet Take 10 mg by mouth at bedtime. 07/25/19   [provider]  fluticasone  furoate-vilanterol (BREO ELLIPTA ) 100-25 MCG/ACT AEPB Inhale 1 puff into the lungs daily.    [provider]  furosemide  (LASIX ) 40 MG tablet Take 1 tablet (40 mg total) by mouth daily. With an extra 40 Mg tablet as needed for swelling, shortness of breath and weight gain. 04/29/23   Miriam Norris, NP  ipratropium-albuterol  (DUONEB) 0.5-2.5 (3) MG/3ML SOLN Take 3 mLs by nebulization in the morning, at noon, in the evening, and at bedtime.    [provider]  LINZESS  145 MCG CAPS capsule Take 145 mcg by mouth daily as needed (constipation). 05/22/21   [provider]  loratadine  (CLARITIN ) 10 MG tablet Take 10 mg by mouth daily. 10/19/23   [provider]  memantine  (NAMENDA ) 10 MG tablet Take 10 mg by mouth 2 (two) times daily.    [provider]  midodrine  (PROAMATINE ) 5 MG tablet Take 1 tablet (5 mg total) by  mouth 2 (two) times daily with a meal. 01/21/23   Debera Jayson MATSU, MD  Multiple Vitamins-Minerals (ONE A DAY WOMEN 50 PLUS PO) Take 1 tablet by mouth daily.    [provider]  pantoprazole  (PROTONIX ) 40 MG tablet TAKE (1) TABLET BY MOUTH TWICE A DAY WITH MEALS (BREAKFAST AND SUPPER) 05/24/19   Marvis Camellia LABOR, NP  potassium chloride  (KLOR-CON ) 10 MEQ tablet Take 1 tablet (10 mEq total) by mouth daily. When taking extra lasix . 11/12/23   Ricky Fines, MD  pregabalin  (LYRICA ) 25 MG capsule Take 1 capsule (25 mg total) by mouth 2 (two) times daily. 11/12/23   Ricky Fines, MD  simvastatin  (ZOCOR ) 10 MG tablet Take 10 mg by mouth at bedtime. 02/27/21   [provider]  traMADol  (ULTRAM ) 50 MG tablet Take 1 tablet (50 mg total) by mouth every 6 (six) hours as needed for moderate pain or severe pain. Patient taking differently: Take 50 mg by mouth every 4 (four) hours. 07/29/22  Evonnie Lenis, MD  zolpidem  (AMBIEN ) 5 MG tablet Take 1 tablet (5 mg total) by mouth at bedtime. 07/29/22   Evonnie Lenis, MD    Physical Exam: Vitals:   12/13/23 1825 12/13/23 1830 12/13/23 1845 12/13/23 1912  BP:    138/69  Pulse: 87 88 88 88  Resp: 17 20 18 18   Temp:      TempSrc:      SpO2: 100% 100% 99% 98%  Weight:      Height:       General: Not in acute distress HEENT:       Eyes: PERRL, EOMI, no jaundice       ENT: No discharge from the ears and nose, no pharynx injection, no tonsillar enlargement.        Neck: No JVD, no bruit, no mass felt. Heme: No neck lymph node enlargement. Cardiac: S1/S2, RRR, No murmurs, No gallops or rubs. Respiratory: No rales, wheezing, rhonchi or rubs. GI: Soft, nondistended, nontender, no rebound pain, no organomegaly, BS present. GU: No hematuria Ext: No pitting leg edema bilaterally. 1+DP/PT pulse bilaterally. Musculoskeletal: No joint deformities, No joint redness or warmth, no limitation of ROM in spin. Skin: No rashes.  Neuro: Alert, oriented X3, cranial  nerves II-XII grossly intact, moves all extremities normally. Muscle strength 5/5 in all extremities, sensation to light touch intact. Brachial reflex 2+ bilaterally. Knee reflex 1+ bilaterally. Negative Babinski's sign. Normal finger to nose test. Psych: Patient is not psychotic, no suicidal or hemocidal ideation.  Labs on Admission: I have personally reviewed following labs and imaging studies  CBC: Recent Labs  Lab 12/13/23 1433  WBC 19.4*  HGB 10.4*  HCT 32.1*  MCV 87.0  PLT 218   Basic Metabolic Panel: Recent Labs  Lab 12/13/23 1433 12/13/23 1615  NA 130*  --   K 4.8  --   CL 93*  --   CO2 21*  --   GLUCOSE 81  --   BUN 34*  --   CREATININE 1.49*  --   CALCIUM  8.6*  --   MG  --  2.1   GFR: Estimated Creatinine Clearance: 20.1 mL/min (A) (by C-G formula based on SCr of 1.49 mg/dL (H)). Liver Function Tests: No results for input(s): AST, ALT, ALKPHOS, BILITOT, PROT, ALBUMIN  in the last 168 hours. No results for input(s): LIPASE, AMYLASE in the last 168 hours. No results for input(s): AMMONIA in the last 168 hours. Coagulation Profile: No results for input(s): INR, PROTIME in the last 168 hours. Cardiac Enzymes: No results for input(s): CKTOTAL, CKMB, CKMBINDEX, TROPONINI in the last 168 hours. BNP (last 3 results) Recent Labs    10/20/23 1554 11/10/23 2127 12/13/23 1616  PROBNP 457.0* 476.0* 6,019.0*   HbA1C: No results for input(s): HGBA1C in the last 72 hours. CBG: No results for input(s): GLUCAP in the last 168 hours. Lipid Profile: No results for input(s): CHOL, HDL, LDLCALC, TRIG, CHOLHDL, LDLDIRECT in the last 72 hours. Thyroid  Function Tests: No results for input(s): TSH, T4TOTAL, FREET4, T3FREE, THYROIDAB in the last 72 hours. Anemia Panel: No results for input(s): VITAMINB12, FOLATE, FERRITIN, TIBC, IRON, RETICCTPCT in the last 72 hours. Urine analysis:    Component Value  Date/Time   COLORURINE YELLOW (A) 12/13/2023 1710   APPEARANCEUR CLOUDY (A) 12/13/2023 1710   LABSPEC 1.013 12/13/2023 1710   PHURINE 5.0 12/13/2023 1710   GLUCOSEU NEGATIVE 12/13/2023 1710   HGBUR SMALL (A) 12/13/2023 1710   BILIRUBINUR NEGATIVE 12/13/2023 1710   KETONESUR 5 (  A) 12/13/2023 1710   PROTEINUR 100 (A) 12/13/2023 1710   UROBILINOGEN 0.2 03/01/2012 2011   NITRITE NEGATIVE 12/13/2023 1710   LEUKOCYTESUR MODERATE (A) 12/13/2023 1710   Sepsis Labs: @LABRCNTIP (procalcitonin:4,lacticidven:4) )No results found for this or any previous visit (from the past 240 hours).   Radiological Exams on Admission:   Assessment/Plan Principal Problem:   Acute on chronic diastolic CHF (congestive heart failure) (HCC) Active Problems:   Myocardial injury   Leukocytosis   COPD (chronic obstructive pulmonary disease) (HCC)   Pulmonary fibrosis (HCC)   UTI (urinary tract infection)   Hypotension   Chronic kidney disease, stage 3b (HCC)   Hyperlipidemia   Chronic pain   Dementia without behavioral disturbance (HCC)   Depression with anxiety   Acute on chronic combined systolic and diastolic congestive heart failure (HCC)   Assessment and Plan: No notes have been filed under this hospital service. Service: Hospitalist      Principal Problem:   Acute on chronic diastolic CHF (congestive heart failure) (HCC) Active Problems:   Myocardial injury   Leukocytosis   COPD (chronic obstructive pulmonary disease) (HCC)   Pulmonary fibrosis (HCC)   UTI (urinary tract infection)   Hypotension   Chronic kidney disease, stage 3b (HCC)   Hyperlipidemia   Chronic pain   Dementia without behavioral disturbance (HCC)   Depression with anxiety   Acute on chronic combined systolic and diastolic congestive heart failure (HCC)    DVT ppx: SQ Heparin          SQ Lovenox   Code Status: Full code   ***  Family Communication:     not done, no family member is at bed side.              Yes,  patient's    at bed side.       by phone   ***  Disposition Plan:  Anticipate discharge back to previous environment  Consults called:    Admission status and Level of care: Progressive:    for obs as inpt        Dispo: The patient is from: {From:23814}              Anticipated d/c is to: {To:23815}              Anticipated d/c date is: {Days:23816}              Patient currently {Medically stable:23817}    Severity of Illness:  {Observation/Inpatient:21159}       Date of Service 12/13/2023    Caleb Exon [facility]   If 7PM-7AM, please contact night-coverage [email or web address] 12/13/2023, 7:30 PM

## 2023-12-14 ENCOUNTER — Ambulatory Visit: Admitting: Orthopedic Surgery

## 2023-12-14 ENCOUNTER — Inpatient Hospital Stay (HOSPITAL_COMMUNITY)
Admit: 2023-12-14 | Discharge: 2023-12-14 | Disposition: A | Discharge status: Home / Self Care | Attending: Internal Medicine | Admitting: Internal Medicine

## 2023-12-14 DIAGNOSIS — I5031 Acute diastolic (congestive) heart failure: Secondary | ICD-10-CM

## 2023-12-14 DIAGNOSIS — I5033 Acute on chronic diastolic (congestive) heart failure: Secondary | ICD-10-CM | POA: Diagnosis not present

## 2023-12-14 LAB — RESP PANEL BY RT-PCR (RSV, FLU A&B, COVID)  RVPGX2
Influenza A by PCR: NEGATIVE
Influenza B by PCR: NEGATIVE
Resp Syncytial Virus by PCR: NEGATIVE
SARS Coronavirus 2 by RT PCR: NEGATIVE

## 2023-12-14 LAB — ECHOCARDIOGRAM COMPLETE
AR max vel: 2.79 cm2
AV Area VTI: 3.88 cm2
AV Area mean vel: 3.14 cm2
AV Mean grad: 2 mmHg
AV Peak grad: 5 mmHg
Ao pk vel: 1.12 m/s
Area-P 1/2: 2.31 cm2
Height: 61 in
MV VTI: 2.21 cm2
S' Lateral: 2.2 cm
Weight: 1841.28 [oz_av]

## 2023-12-14 LAB — CBC
HCT: 29.3 % — ABNORMAL LOW (ref 36.0–46.0)
Hemoglobin: 9.7 g/dL — ABNORMAL LOW (ref 12.0–15.0)
MCH: 28.6 pg (ref 26.0–34.0)
MCHC: 33.1 g/dL (ref 30.0–36.0)
MCV: 86.4 fL (ref 80.0–100.0)
Platelets: 197 K/uL (ref 150–400)
RBC: 3.39 MIL/uL — ABNORMAL LOW (ref 3.87–5.11)
RDW: 13 % (ref 11.5–15.5)
WBC: 12 K/uL — ABNORMAL HIGH (ref 4.0–10.5)
nRBC: 0 % (ref 0.0–0.2)

## 2023-12-14 LAB — BASIC METABOLIC PANEL WITH GFR
Anion gap: 14 (ref 5–15)
BUN: 33 mg/dL — ABNORMAL HIGH (ref 8–23)
CO2: 23 mmol/L (ref 22–32)
Calcium: 8.4 mg/dL — ABNORMAL LOW (ref 8.9–10.3)
Chloride: 95 mmol/L — ABNORMAL LOW (ref 98–111)
Creatinine, Ser: 1.39 mg/dL — ABNORMAL HIGH (ref 0.44–1.00)
GFR, Estimated: 37 mL/min — ABNORMAL LOW (ref >60)
Glucose, Bld: 93 mg/dL (ref 70–99)
Potassium: 4.5 mmol/L (ref 3.5–5.1)
Sodium: 131 mmol/L — ABNORMAL LOW (ref 135–145)

## 2023-12-14 LAB — STREP PNEUMONIAE URINARY ANTIGEN: Strep Pneumo Urinary Antigen: NEGATIVE

## 2023-12-14 LAB — MRSA NEXT GEN BY PCR, NASAL: MRSA by PCR Next Gen: DETECTED — AB

## 2023-12-14 MED ORDER — FUROSEMIDE 10 MG/ML IJ SOLN
40.0000 mg | Freq: Every day | INTRAMUSCULAR | Status: DC
  Administered 2023-12-14 – 2023-12-18 (×5): 40 mg via INTRAVENOUS
  Filled 2023-12-14 (×5): qty 4

## 2023-12-14 MED ORDER — METOPROLOL TARTRATE 5 MG/5ML IV SOLN
5.0000 mg | Freq: Four times a day (QID) | INTRAVENOUS | Status: DC | PRN
  Administered 2023-12-14: 5 mg via INTRAVENOUS
  Filled 2023-12-14: qty 5

## 2023-12-14 MED ORDER — KETOROLAC TROMETHAMINE 15 MG/ML IJ SOLN
15.0000 mg | Freq: Once | INTRAMUSCULAR | Status: AC
  Administered 2023-12-14: 15 mg via INTRAVENOUS
  Filled 2023-12-14: qty 1

## 2023-12-14 MED ORDER — AZITHROMYCIN 250 MG PO TABS
500.0000 mg | ORAL_TABLET | Freq: Every day | ORAL | Status: DC
  Administered 2023-12-15: 500 mg via ORAL
  Filled 2023-12-14: qty 2

## 2023-12-14 MED ORDER — ALPRAZOLAM 0.5 MG PO TABS
0.5000 mg | ORAL_TABLET | Freq: Two times a day (BID) | ORAL | Status: DC
  Administered 2023-12-14 – 2023-12-18 (×8): 0.5 mg via ORAL
  Filled 2023-12-14 (×8): qty 1

## 2023-12-14 MED ORDER — ASPIRIN 81 MG PO TBEC
81.0000 mg | DELAYED_RELEASE_TABLET | Freq: Every day | ORAL | Status: DC
  Administered 2023-12-14 – 2023-12-15 (×2): 81 mg via ORAL
  Filled 2023-12-14 (×2): qty 1

## 2023-12-14 MED ORDER — SODIUM CHLORIDE 0.9 % IV SOLN
2.0000 g | INTRAVENOUS | Status: DC
  Administered 2023-12-14 – 2023-12-17 (×4): 2 g via INTRAVENOUS
  Filled 2023-12-14 (×6): qty 12.5

## 2023-12-14 MED ORDER — VANCOMYCIN HCL 750 MG/150ML IV SOLN
750.0000 mg | INTRAVENOUS | Status: DC
  Administered 2023-12-15: 750 mg via INTRAVENOUS
  Filled 2023-12-14: qty 150

## 2023-12-14 MED ORDER — SODIUM CHLORIDE 0.9 % IV SOLN
500.0000 mg | Freq: Once | INTRAVENOUS | Status: AC
  Administered 2023-12-14: 500 mg via INTRAVENOUS
  Filled 2023-12-14: qty 5

## 2023-12-14 NOTE — Progress Notes (Signed)
 [facility] rm 240 [facility] patient visit  [name] is a current [facility] patient with a terminal diagnosis of cerebrovascular disease. Prior to 2 days ago patient was able to ambulate independently with 2 wheeled walker however within the last 2 days she had declined to the point of not even being able to hold cup or feed self. Family made decision for further evaluation in ED due to this rapid decline. Patient was admitted with diagnosis of acute on chronic CHF. Per Dr. Eric Showman this is a hospice related admission. Patient is a DNR.   Visited patient at bedside, she was resting peacefully without symptoms of distress, no family present.  She has received IV fluids as well as IV antibiotics which will continue and is GIP appropriate for this reason.  Vital Signs- 98.5/ 65/14    87/59    spO2 99% on room air I&O- 272/not recorded Abnormal Labs- Na+ 131, BUN 33, Creatinine 1.39, Ca+ 8.4 Diagnostics-   PORTABLE CHEST 1 VIEW  IMPRESSION: Lower inspiratory volumes with significantly increased diffuse interstitial and airspace opacities bilaterally. Differential considerations include CHF versus atypical or viral pneumonia. Electronically Signed   By: Wilkie Lent M.D.   On: 12/13/2023 16:25  IV/PRN Meds- Zithromax  500mg  once, Cefepime  2g IV once, LR 1L IV once, LR 150ml/H continuous IV, Vancomycin  1g IV once, Lopressor  5mg  IV once Current plan as per H&P, Dr. Xilin Niu 12.9.25  Acute on chronic diastolic CHF (congestive heart failure) [facility]): Patient has trace leg edema, but has significantly elevated proBNP 6019, positive JVD, consistent with CHF exacerbation.  Chest x-ray showed interstitial opacity which is likely due to CHF exacerbation.  Patient had hypotension initially, which improved to SBP 110s after giving 1 L of LR in ED.  Currently patient is on 2 L oxygen with 99% of saturation. Will hold off IV lasix  now and monitor Bp closely. Will  start midodrine  10 mg 3 times daily.  After blood pressure is fully stabilized, may start low-dose IV Lasix  then   -Will admit to PCU as inpatient -2d echo -Daily weights -strict I/O's -Low salt diet -Fluid restriction -As needed bronchodilators for shortness of breath   Myocardial injury: Trop  34 --> 29.  Likely demand ischemia. -Aspirin , Zocor    Leukocytosis and possible HCAP: WBC 19.4.  Temperature 99.4 chest x-ray showed bilateral interstitial opacity, cannot completely rule out possibility of pneumonia.   - IV Vancomycin  and cefepime  - Mucinex  for cough  - Bronchodilators - Urine legionella and S. pneumococcal antigen - Follow up blood culture x2, sputum culture   COPD (chronic obstructive pulmonary disease) (HCC) and Pulmonary fibrosis (HCC): no wheezing. -Bronchodilators and as needed Mucinex    UTI (urinary tract infection) -On broad antibiotics as above - Follow-up urine culture   Hypotension: This is chronic issue.  Improved, with SBP 110s after giving 1 L LR in ED. -Midodrine  10 mg 3 times daily   Chronic kidney disease, stage 3b (HCC): Renal function is worse than baseline.  Recent baseline creatinine 1.15 on 11/12/2023.  Her creatinine is 1.49, BUN 34, GFR 34. -Follow up with BMP   Hyperlipidemia -Zocor    Chronic pain -Lyric, as needed oxycodone    Dementia without behavioral disturbance (HCC) -Donepezil  and Namenda    Depression with anxiety - Continue home medications   Mass in left neck: Patient has a mass in left neck, approximately 6-7 cm in size.  This is a chronic issue.  -f/u with PCP as outpt   Discharge  Planning- Ongoing, likely home once medically stable Family Contact- None, left voicemail for daughter IDT: updated Goals of Care: DNR Transfer summary and Med list sent to be placed under Media  Thank you for the opportunity to participate in this patient's care, please don't hesitate to call for any hospice related questions or concerns.   Hunter Seip BSN, [facility] Liaison [phone]

## 2023-12-14 NOTE — Progress Notes (Signed)
 Pharmacy Antibiotic Note  [name] is a 89 y.o. female admitted on 12/13/2023 with HCAP.  Pharmacy has been consulted for Cefepime  and Vancomycin  dosing.  Plan: Cefepime  2 gm q24hr per indication & renal fxn.  Pt given Vancomycin  1000 mg once. Vancomycin  750 mg IV Q 48 hrs. Goal AUC 400-550. Expected AUC: 495.6 SCr used: 1.49  Follow up culture results to assess for antibiotic optimization. Monitor renal function to assess for any necessary antibiotic dosing changes. Pharmacy will continue to follow and will adjust abx dosing whenever warranted.  Temp (24hrs), Avg:98.6 F (37 C), Min:98.2 F (36.8 C), Max:99.4 F (37.4 C)   Recent Labs  Lab 12/13/23 1433 12/13/23 1615  WBC 19.4*  --   CREATININE 1.49*  --   LATICACIDVEN  --  0.9    Estimated Creatinine Clearance: 20.1 mL/min (A) (by C-G formula based on SCr of 1.49 mg/dL (H)).    Allergies  Allergen Reactions   Codeine Anaphylaxis, Shortness Of Breath and Rash   Neurontin [Gabapentin] Anaphylaxis   Sulfonamide Derivatives Hives and Shortness Of Breath   Aloe Other (See Comments)    Unknown reaction   Levaquin [Levofloxacin] Other (See Comments)    Pt admitted to hospital with lung problems due to this medication.   Ms Contin  [Morphine ] Other (See Comments)    Patient's daughter reports hypoxia requiring O2 with morphine  and similar opiates.  But does okay with tramadol .   Septra [Sulfamethoxazole-Trimethoprim] Hives   Sulfa Antibiotics Hives   Trimethoprim Hives   Vibra -Tab [Doxycycline ] Hives    Antimicrobials this admission: 12/09 Cefepime  >>  12/09 Vancomycin  >>   Microbiology results: 12/09 BCx: Pending 12/09 UCx: Pending  12/09 Sputum: Pending   Thank you for allowing pharmacy to be a part of this patients care.  Rankin CANDIE Dills, PharmD, [facility] 12/14/2023 2:35 AM

## 2023-12-14 NOTE — Progress Notes (Signed)
 PROGRESS NOTE   HPI was taken from Dr. Hilma:  [name] is a 89 y.o. female with medical history significant of dCHF,  HLD, COPD, pulmonary fibrosis, dementia, CKD-3B, anxiety, anemia, chronic pain, IBS, chronic hypotension, under hospice, who presents with weakness.     Patient has hx of dementia, and is unable to provide accurate medical history, therefore, most of the history is obtained by discussing the case with ED physician, per EMS report, and with the nursing staff.   Per report, pt has overall health is declining recently, not feeling well with generalized weakness. She is noted to be much weaker in the past 2 days.  She is no longer able to feed herself. She needs to be picked up and carried to be moved. Per EDP, when hospice nurse came today, pt is noted to have low blood pressure.  The family's concern for an infection, sepsis, or stroke.  Patient does not have any acute pain.  When I saw pt in ED, pt seems to know her own name, not orientated to place and time.  She moves all extremities.  No facial droop noted.  She seems to have difficulty breathing, no active cough.  No active nausea vomiting, diarrhea noted.  Not sure if patient has symptoms of UTI. Pt has a chronic mass in left neck.   Initially patient had low blood pressure 78/56 which improved to SBP 110s after giving 1 L LR in ED.   Data reviewed independently and ED Course: pt was found to have BNP 6019, positive UA (cloudy appearance, moderate amount leukocyte, many bacteria, WBC> 50, squamous cell 11-20), troponin 34 --> 29, lactic acid 0.9, WBC 19.4, worsening renal function, negative PCR for COVID, flu and RSV.  Temperature 99.4, heart rate 80-90s, RR 21, oxygen saturation 99% on 2 L oxygen (not sure how much oxygen patient is using at home). Patient is admitted to PCU as inpatient.   Chest x-ray: Lower inspiratory volumes with significantly increased diffuse interstitial and airspace opacities bilaterally.  Differential considerations include CHF versus atypical or viral pneumonia.   CT of head:  No acute intracranial abnormality. 2. Extensive chronic small vessel ischemic disease. 3. Chronic right mastoid and middle ear effusions.     [name]  FMW:[id number] DOB: 04/14/1934 DOA: 12/13/2023 PCP: [name], MD   Assessment & Plan:   Principal Problem:   Acute on chronic diastolic CHF (congestive heart failure) (HCC) Active Problems:   Myocardial injury   Leukocytosis   COPD (chronic obstructive pulmonary disease) (HCC)   Pulmonary fibrosis (HCC)   UTI (urinary tract infection)   Hypotension   Chronic kidney disease, stage 3b (HCC)   Hyperlipidemia   Chronic pain   Dementia without behavioral disturbance (HCC)   Depression with anxiety  Assessment and Plan:  Acute on chronic diastolic CHF: has trace leg edema, but has significantly elevated proBNP 6019, positive JVD. Continue on IV lasix . Monitor I/Os. Fluid restriction    Myocardial injury: Trop  34 --> 29.  Likely demand ischemia.  Possible HCAP: continue on IV cefepime , vanco & azithromycin , bronchodilators & encourage incentive spirometry. Legionella is pending. Blood cxs NGTD   Hx of pulmonary fibrosis: continue w/ supportive care  Hx of COPD: bronchodilators prn. Encourage incentive spirometry    Possible UTI: urine cx is pending. Continue on IV abxs   Hypotension: chronic. Improved. Continue on midodrine     CKDIIIb: Cr is trending down from day prior. Avoid nephrotoxic meds  HLD: continue on statin    Chronic pain: oxy prn    Dementia: continue on home dose of namenda , donepezil     Anxiety : severity unknown. Continue on home dose of xanax   Mass in left neck:  approximately 6-7 cm in size. Chronic issue. F/u with PCP as outpt      DVT prophylaxis: lovenox  Code Status: DNR Family Communication:  Disposition Plan: depends on PT/OT recs  Level of care: Progressive  Status is:  Inpatient Remains inpatient appropriate because: severity of illness    Consultants:    Procedures:   Antimicrobials: vanco, cefepime , azithromycin    Subjective: Pt c/o malaise  Objective: Vitals:   12/14/23 0032 12/14/23 0416 12/14/23 0500 12/14/23 0731  BP: 109/61 115/63  96/64  Pulse: 89 88  88  Resp: 20 19    Temp: 98.6 F (37 C) 98.2 F (36.8 C)  100.3 F (37.9 C)  TempSrc:  Oral    SpO2: 96% 97%  96%  Weight:   52.2 kg   Height:        Intake/Output Summary (Last 24 hours) at 12/14/2023 0930 Last data filed at 12/14/2023 9071 Gross per 24 hour  Intake 271.96 ml  Output --  Net 271.96 ml   Filed Weights   12/13/23 1431 12/14/23 0500  Weight: 49.9 kg 52.2 kg    Examination:  General exam: Appears uncomfortable. Poor dentition. Neck mass Respiratory system: decreased breath sounds b/l Cardiovascular system: S1 & S2+. No  rubs, gallops or clicks.  Gastrointestinal system: Abdomen is nondistended, soft and nontender. Normal bowel sounds heard. Central nervous system: Alert and awake Psychiatry: Judgement and insight appears poor. Flat mood and affect    Data Reviewed: I have personally reviewed following labs and imaging studies  CBC: Recent Labs  Lab 12/13/23 1433 12/14/23 0508  WBC 19.4* 12.0*  HGB 10.4* 9.7*  HCT 32.1* 29.3*  MCV 87.0 86.4  PLT 218 197   Basic Metabolic Panel: Recent Labs  Lab 12/13/23 1433 12/13/23 1615 12/14/23 0508  NA 130*  --  131*  K 4.8  --  4.5  CL 93*  --  95*  CO2 21*  --  23  GLUCOSE 81  --  93  BUN 34*  --  33*  CREATININE 1.49*  --  1.39*  CALCIUM  8.6*  --  8.4*  MG  --  2.1  --    GFR: Estimated Creatinine Clearance: 21.5 mL/min (A) (by C-G formula based on SCr of 1.39 mg/dL (H)). Liver Function Tests: No results for input(s): AST, ALT, ALKPHOS, BILITOT, PROT, ALBUMIN  in the last 168 hours. No results for input(s): LIPASE, AMYLASE in the last 168 hours. No results for input(s):  AMMONIA in the last 168 hours. Coagulation Profile: No results for input(s): INR, PROTIME in the last 168 hours. Cardiac Enzymes: No results for input(s): CKTOTAL, CKMB, CKMBINDEX, TROPONINI in the last 168 hours. BNP (last 3 results) Recent Labs    10/20/23 1554 11/10/23 2127 12/13/23 1616  PROBNP 457.0* 476.0* 6,019.0*   HbA1C: No results for input(s): HGBA1C in the last 72 hours. CBG: No results for input(s): GLUCAP in the last 168 hours. Lipid Profile: No results for input(s): CHOL, HDL, LDLCALC, TRIG, CHOLHDL, LDLDIRECT in the last 72 hours. Thyroid  Function Tests: No results for input(s): TSH, T4TOTAL, FREET4, T3FREE, THYROIDAB in the last 72 hours. Anemia Panel: No results for input(s): VITAMINB12, FOLATE, FERRITIN, TIBC, IRON, RETICCTPCT in the last 72 hours. Sepsis Labs: Recent Labs  Lab  12/13/23 1615  LATICACIDVEN 0.9    Recent Results (from the past 240 hours)  Resp panel by RT-PCR (RSV, Flu A&B, Covid) Anterior Nasal Swab     Status: None   Collection Time: 12/13/23 12:32 AM   Specimen: Anterior Nasal Swab  Result Value Ref Range Status   SARS Coronavirus 2 by RT PCR NEGATIVE NEGATIVE Final    Comment: (NOTE) SARS-CoV-2 target nucleic acids are NOT DETECTED.  The SARS-CoV-2 RNA is generally detectable in upper respiratory specimens during the acute phase of infection. The lowest concentration of SARS-CoV-2 viral copies this assay can detect is 138 copies/mL. A negative result does not preclude SARS-Cov-2 infection and should not be used as the sole basis for treatment or other patient management decisions. A negative result may occur with  improper specimen collection/handling, submission of specimen other than nasopharyngeal swab, presence of viral mutation(s) within the areas targeted by this assay, and inadequate number of viral copies(<138 copies/mL). A negative result must be combined with clinical  observations, patient history, and epidemiological information. The expected result is Negative.  Fact Sheet for Patients:  bloggercourse.com  Fact Sheet for Healthcare Providers:  seriousbroker.it  This test is no t yet approved or cleared by the [place]  FDA and  has been authorized for detection and/or diagnosis of SARS-CoV-2 by FDA under an Emergency Use Authorization (EUA). This EUA will remain  in effect (meaning this test can be used) for the duration of the COVID-19 declaration under Section 564(b)(1) of the Act, 21 U.S.C.section 360bbb-3(b)(1), unless the authorization is terminated  or revoked sooner.       Influenza A by PCR NEGATIVE NEGATIVE Final   Influenza B by PCR NEGATIVE NEGATIVE Final    Comment: (NOTE) The Xpert Xpress SARS-CoV-2/FLU/RSV plus assay is intended as an aid in the diagnosis of influenza from Nasopharyngeal swab specimens and should not be used as a sole basis for treatment. Nasal washings and aspirates are unacceptable for Xpert Xpress SARS-CoV-2/FLU/RSV testing.  Fact Sheet for Patients: bloggercourse.com  Fact Sheet for Healthcare Providers: seriousbroker.it  This test is not yet approved or cleared by the [place]  FDA and has been authorized for detection and/or diagnosis of SARS-CoV-2 by FDA under an Emergency Use Authorization (EUA). This EUA will remain in effect (meaning this test can be used) for the duration of the COVID-19 declaration under Section 564(b)(1) of the Act, 21 U.S.C. section 360bbb-3(b)(1), unless the authorization is terminated or revoked.     Resp Syncytial Virus by PCR NEGATIVE NEGATIVE Final    Comment: (NOTE) Fact Sheet for Patients: bloggercourse.com  Fact Sheet for Healthcare Providers: seriousbroker.it  This test is not yet approved or cleared by  the [place]  FDA and has been authorized for detection and/or diagnosis of SARS-CoV-2 by FDA under an Emergency Use Authorization (EUA). This EUA will remain in effect (meaning this test can be used) for the duration of the COVID-19 declaration under Section 564(b)(1) of the Act, 21 U.S.C. section 360bbb-3(b)(1), unless the authorization is terminated or revoked.  Performed at [facility], [address] [place], [place] [address]   Blood Culture (routine x 2)     Status: None (Preliminary result)   Collection Time: 12/13/23  4:16 PM   Specimen: BLOOD  Result Value Ref Range Status   Specimen Description BLOOD LEFT ANTECUBITAL  Final   Special Requests   Final    BOTTLES DRAWN AEROBIC AND ANAEROBIC Blood Culture adequate volume   Culture   Final  NO GROWTH < 24 HOURS Performed at [facility], [address] [place], [place] [address]    Report Status PENDING  Incomplete  Blood Culture (routine x 2)     Status: None (Preliminary result)   Collection Time: 12/13/23  4:16 PM   Specimen: BLOOD  Result Value Ref Range Status   Specimen Description BLOOD RIGHT ANTECUBITAL  Final   Special Requests   Final    BOTTLES DRAWN AEROBIC AND ANAEROBIC Blood Culture adequate volume   Culture   Final    NO GROWTH < 24 HOURS Performed at [facility], [address] [place], [place] [address]    Report Status PENDING  Incomplete         Radiology Studies: DG Chest Port 1 View Result Date: 12/13/2023 CLINICAL DATA:  Possible sepsis EXAM: PORTABLE CHEST 1 VIEW COMPARISON:  Prior chest CT 11/27/2022; prior chest x-ray 11/10/2023 FINDINGS: Lower inspiratory volumes with significantly increased diffuse interstitial and airspace opacities bilaterally. Stable cardiomegaly and large hiatal hernia. Very limited chest x-ray. No acute osseous abnormality. IMPRESSION: Lower inspiratory volumes with significantly increased diffuse interstitial and airspace  opacities bilaterally. Differential considerations include CHF versus atypical or viral pneumonia. Electronically Signed   By: Wilkie Lent M.D.   On: 12/13/2023 16:25   CT Head Wo Contrast Result Date: 12/13/2023 EXAM: CT HEAD WITHOUT 12/13/2023 03:43:45 PM TECHNIQUE: CT of the head was performed without the administration of intravenous contrast. Automated exposure control, iterative reconstruction, and/or weight based adjustment of the mA/kV was utilized to reduce the radiation dose to as low as reasonably achievable. COMPARISON: Head CT 10/14/2023. CLINICAL HISTORY: Neuro deficit, acute, stroke suspected. FINDINGS: BRAIN AND VENTRICLES: There is no evidence of an acute infarct, acute intracranial hemorrhage, midline shift, hydrocephalus, or extra-axial fluid collection. There is mild cerebral atrophy. Confluent hypodensities in the cerebral white matter bilaterally are similar to the prior CT and nonspecific but compatible with extensive chronic small vessel ischemic disease. A subcentimeter hyperdense focus/possible calcification in the posterior right frontal white matter is partially obscured by streak artifact but is unchanged from multiple prior examinations. Calcified atherosclerosis at the skull base. ORBITS: Right cataract extraction. SINUSES AND MASTOIDS: Persistent complete opacification of the right mastoid air cells and right middle ear cavity. Well aerated paranasal sinuses. SOFT TISSUES AND SKULL: No acute skull fracture. No acute soft tissue abnormality. IMPRESSION: 1. No acute intracranial abnormality. 2. Extensive chronic small vessel ischemic disease. 3. Chronic right mastoid and middle ear effusions. Electronically signed by: Dasie Hamburg MD 12/13/2023 04:15 PM EST RP Workstation: [id number]        Scheduled Meds:  aspirin  EC  81 mg Oral Daily   donepezil   10 mg Oral QHS   enoxaparin  (LOVENOX ) injection  30 mg Subcutaneous QHS   loratadine   10 mg Oral Daily   memantine   10  mg Oral BID   midodrine   10 mg Oral TID WC   multivitamin with minerals   Oral Daily   pantoprazole   40 mg Oral BID   simvastatin   10 mg Oral QHS   Continuous Infusions:  ceFEPime  (MAXIPIME ) IV     [START ON 12/15/2023] vancomycin        LOS: 1 day      Anthony CHRISTELLA Pouch, MD [facility] Pager [phone]  If 7PM-7AM, please contact night-coverage [email or web address] 12/14/2023, 9:30 AM

## 2023-12-14 NOTE — Progress Notes (Signed)
*  PRELIMINARY RESULTS* Echocardiogram 2D Echocardiogram has been performed.  Floydene Harder 12/14/2023, 11:44 AM

## 2023-12-14 NOTE — Plan of Care (Signed)
  Problem: Education: Goal: Ability to demonstrate management of disease process will improve Outcome: Progressing Goal: Ability to verbalize understanding of medication therapies will improve Outcome: Progressing Goal: Individualized Educational Video(s) Outcome: Progressing   Problem: Activity: Goal: Capacity to carry out activities will improve Outcome: Progressing   Problem: Cardiac: Goal: Ability to achieve and maintain adequate cardiopulmonary perfusion will improve Outcome: Progressing   Problem: Education: Goal: Knowledge of disease or condition will improve Outcome: Progressing

## 2023-12-14 NOTE — Progress Notes (Signed)
°   12/14/23 0900  Assess: MEWS Score  ECG Heart Rate (!) 105  Resp 17  Assess: MEWS Score  MEWS Temp 0  MEWS Systolic 1  MEWS Pulse 1  MEWS RR 0  MEWS LOC 0  MEWS Score 2  MEWS Score Color Yellow  Assess: if the MEWS score is Yellow or Red  Were vital signs accurate and taken at a resting state? Yes  Does the patient meet 2 or more of the SIRS criteria? No  MEWS guidelines implemented  Yes, yellow  Treat  MEWS Interventions Considered administering scheduled or prn medications/treatments as ordered  Take Vital Signs  Increase Vital Sign Frequency  Yellow: Q2hr x1, continue Q4hrs until patient remains green for 12hrs  Escalate  MEWS: Escalate Yellow: Discuss with charge nurse and consider notifying provider and/or RRT  Notify: Charge Nurse/RN  Name of Charge Nurse/RN Notified Rosina, RN  Provider Notification  Provider Name/Title Williams MD  Date Provider Notified 12/14/23  Time Provider Notified 0900  Method of Notification Page  Notification Reason Other (Comment) (Yellow MEWS)  Provider response Evaluate remotely  Date of Provider Response 12/14/23  Time of Provider Response 0905  Assess: SIRS CRITERIA  SIRS Temperature  0  SIRS Respirations  0  SIRS Pulse 1

## 2023-12-14 NOTE — Progress Notes (Signed)
 Inaccurate urine output documented this shift d/t multiple unmeasureable occurrences r/t difficulty with purewick.

## 2023-12-15 ENCOUNTER — Inpatient Hospital Stay

## 2023-12-15 DIAGNOSIS — I5033 Acute on chronic diastolic (congestive) heart failure: Principal | ICD-10-CM

## 2023-12-15 LAB — BASIC METABOLIC PANEL WITH GFR
Anion gap: 13 (ref 5–15)
BUN: 35 mg/dL — ABNORMAL HIGH (ref 8–23)
CO2: 24 mmol/L (ref 22–32)
Calcium: 8.4 mg/dL — ABNORMAL LOW (ref 8.9–10.3)
Chloride: 93 mmol/L — ABNORMAL LOW (ref 98–111)
Creatinine, Ser: 1.36 mg/dL — ABNORMAL HIGH (ref 0.44–1.00)
GFR, Estimated: 38 mL/min — ABNORMAL LOW (ref >60)
Glucose, Bld: 103 mg/dL — ABNORMAL HIGH (ref 70–99)
Potassium: 4.1 mmol/L (ref 3.5–5.1)
Sodium: 130 mmol/L — ABNORMAL LOW (ref 135–145)

## 2023-12-15 LAB — CBC
HCT: 30.2 % — ABNORMAL LOW (ref 36.0–46.0)
Hemoglobin: 9.9 g/dL — ABNORMAL LOW (ref 12.0–15.0)
MCH: 28.6 pg (ref 26.0–34.0)
MCHC: 32.8 g/dL (ref 30.0–36.0)
MCV: 87.3 fL (ref 80.0–100.0)
Platelets: 217 K/uL (ref 150–400)
RBC: 3.46 MIL/uL — ABNORMAL LOW (ref 3.87–5.11)
RDW: 13.1 % (ref 11.5–15.5)
WBC: 13.2 K/uL — ABNORMAL HIGH (ref 4.0–10.5)
nRBC: 0 % (ref 0.0–0.2)

## 2023-12-15 LAB — RESPIRATORY PANEL BY PCR

## 2023-12-15 LAB — LEGIONELLA PNEUMOPHILA SEROGP 1 UR AG: L. pneumophila Serogp 1 Ur Ag: NEGATIVE

## 2023-12-15 MED ORDER — IPRATROPIUM-ALBUTEROL 0.5-2.5 (3) MG/3ML IN SOLN
3.0000 mL | Freq: Four times a day (QID) | RESPIRATORY_TRACT | Status: DC | PRN
  Administered 2023-12-15 – 2023-12-16 (×2): 3 mL via RESPIRATORY_TRACT
  Filled 2023-12-15 (×4): qty 3

## 2023-12-15 MED ORDER — MUPIROCIN 2 % EX OINT
1.0000 | TOPICAL_OINTMENT | Freq: Two times a day (BID) | CUTANEOUS | Status: DC
  Administered 2023-12-15 – 2023-12-18 (×7): 1 via NASAL
  Filled 2023-12-15: qty 22

## 2023-12-15 MED ORDER — PREDNISONE 20 MG PO TABS
40.0000 mg | ORAL_TABLET | Freq: Every day | ORAL | Status: DC
  Administered 2023-12-15 – 2023-12-17 (×3): 40 mg via ORAL
  Filled 2023-12-15 (×3): qty 2

## 2023-12-15 MED ORDER — FLUTICASONE FUROATE-VILANTEROL 200-25 MCG/ACT IN AEPB
1.0000 | INHALATION_SPRAY | Freq: Every day | RESPIRATORY_TRACT | Status: DC
  Administered 2023-12-15 – 2023-12-18 (×3): 1 via RESPIRATORY_TRACT
  Filled 2023-12-15: qty 28

## 2023-12-15 NOTE — Progress Notes (Signed)
 PROGRESS NOTE   HPI was taken from Dr. Hilma:  [name] is a 89 y.o. female with medical history significant of dCHF,  HLD, COPD, pulmonary fibrosis, dementia, CKD-3B, anxiety, anemia, chronic pain, IBS, chronic hypotension, under hospice, who presents with weakness.     Patient has hx of dementia, and is unable to provide accurate medical history, therefore, most of the history is obtained by discussing the case with ED physician, per EMS report, and with the nursing staff.   Per report, pt has overall health is declining recently, not feeling well with generalized weakness. She is noted to be much weaker in the past 2 days.  She is no longer able to feed herself. She needs to be picked up and carried to be moved. Per EDP, when hospice nurse came today, pt is noted to have low blood pressure.  The family's concern for an infection, sepsis, or stroke.  Patient does not have any acute pain.  When I saw pt in ED, pt seems to know her own name, not orientated to place and time.  She moves all extremities.  No facial droop noted.  She seems to have difficulty breathing, no active cough.  No active nausea vomiting, diarrhea noted.  Not sure if patient has symptoms of UTI. Pt has a chronic mass in left neck.   Initially patient had low blood pressure 78/56 which improved to SBP 110s after giving 1 L LR in ED.   Data reviewed independently and ED Course: pt was found to have BNP 6019, positive UA (cloudy appearance, moderate amount leukocyte, many bacteria, WBC> 50, squamous cell 11-20), troponin 34 --> 29, lactic acid 0.9, WBC 19.4, worsening renal function, negative PCR for COVID, flu and RSV.  Temperature 99.4, heart rate 80-90s, RR 21, oxygen saturation 99% on 2 L oxygen (not sure how much oxygen patient is using at home). Patient is admitted to PCU as inpatient.   Chest x-ray: Lower inspiratory volumes with significantly increased diffuse interstitial and airspace opacities bilaterally.  Differential considerations include CHF versus atypical or viral pneumonia.   CT of head:  No acute intracranial abnormality. 2. Extensive chronic small vessel ischemic disease. 3. Chronic right mastoid and middle ear effusions.     [name]  FMW:[id number] DOB: January 07, 1934 DOA: 12/13/2023 PCP: [name], MD   Assessment & Plan:   Principal Problem:   Acute on chronic diastolic CHF (congestive heart failure) (HCC) Active Problems:   Myocardial injury   Leukocytosis   COPD (chronic obstructive pulmonary disease) (HCC)   Pulmonary fibrosis (HCC)   UTI (urinary tract infection)   Hypotension   Chronic kidney disease, stage 3b (HCC)   Hyperlipidemia   Chronic pain   Dementia without behavioral disturbance (HCC)   Depression with anxiety  Assessment and Plan:  Acute on chronic diastolic CHF: has trace leg edema, but has significantly elevated proBNP 6019, positive JVD. Continue on IV lasix . Monitor I/Os. Fluid restriction    Myocardial injury: Trop  34 --> 29.  Likely demand ischemia.  Stroke concern: today family tells me there was an abrupt change in patient's functional status, trouble swallowing, facial droop, that accompanied her initial presentation symptoms.   Possible CAP: no significant respiratory symptms. Possible ILD at recent pulm visit. Will de-escalate abx today to cefepime . Legionella is pending. Blood cxs NGTD. Trial off oxygen today. F/u rvp.    Hx of pulmonary fibrosis: continue w/ supportive care. Add oral prednisone   Hx of COPD: bronchodilators  prn. Encourage incentive spirometry    Possible UTI: urine cx is pending. Continue on IV abxs   Hypotension: chronic. Improved. Continue on midodrine     CKDIIIb: stable   HLD: continue on statin    Chronic pain: oxy prn    Dementia: continue on home dose of namenda , donepezil     Anxiety : severity unknown. Continue on home dose of xanax   Mass in left neck:  approximately 6-7 cm in size. Chronic  issue. F/u with PCP as outpt  Hospice patient: family interested in snf, will ask toc to discuss, will also order palliative consult for goals of care discussion      DVT prophylaxis: lovenox  Code Status: DNR Family Communication: family at bedside Disposition Plan: snf vs home with home health  Level of care: Progressive  Status is: Inpatient Remains inpatient appropriate because: severity of illness    Subjective:  No complaints  Objective: Vitals:   12/14/23 2323 12/15/23 0410 12/15/23 0802 12/15/23 1144  BP: 90/68 (!) 107/59 (!) 82/61 (!) 97/57  Pulse: 87 73 (!) 128 67  Resp: 18 16 18 16   Temp: 97.8 F (36.6 C) 97.9 F (36.6 C) 98.5 F (36.9 C) 98.3 F (36.8 C)  TempSrc:   Oral   SpO2: 97% 99% 97% 100%  Weight:  52.2 kg    Height:        Intake/Output Summary (Last 24 hours) at 12/15/2023 1429 Last data filed at 12/15/2023 1043 Gross per 24 hour  Intake 962.01 ml  Output 1000 ml  Net -37.99 ml   Filed Weights   12/13/23 1431 12/14/23 0500 12/15/23 0410  Weight: 49.9 kg 52.2 kg 52.2 kg    Examination:  General exam: appears chronically ill Respiratory system: decreased breath sounds b/l, scattered rales Cardiovascular system: S1 & S2+.  Gastrointestinal system: Abdomen is nondistended, soft and nontender.  Central nervous system: Alert and awake,. Moves all 4, decreased strength throughout Psychiatry: calm, pleasantly confused    Data Reviewed: I have personally reviewed following labs and imaging studies  CBC: Recent Labs  Lab 12/13/23 1433 12/14/23 0508 12/15/23 0433  WBC 19.4* 12.0* 13.2*  HGB 10.4* 9.7* 9.9*  HCT 32.1* 29.3* 30.2*  MCV 87.0 86.4 87.3  PLT 218 197 217   Basic Metabolic Panel: Recent Labs  Lab 12/13/23 1433 12/13/23 1615 12/14/23 0508 12/15/23 0433  NA 130*  --  131* 130*  K 4.8  --  4.5 4.1  CL 93*  --  95* 93*  CO2 21*  --  23 24  GLUCOSE 81  --  93 103*  BUN 34*  --  33* 35*  CREATININE 1.49*  --   1.39* 1.36*  CALCIUM  8.6*  --  8.4* 8.4*  MG  --  2.1  --   --    GFR: Estimated Creatinine Clearance: 22 mL/min (A) (by C-G formula based on SCr of 1.36 mg/dL (H)). Liver Function Tests: No results for input(s): AST, ALT, ALKPHOS, BILITOT, PROT, ALBUMIN  in the last 168 hours. No results for input(s): LIPASE, AMYLASE in the last 168 hours. No results for input(s): AMMONIA in the last 168 hours. Coagulation Profile: No results for input(s): INR, PROTIME in the last 168 hours. Cardiac Enzymes: No results for input(s): CKTOTAL, CKMB, CKMBINDEX, TROPONINI in the last 168 hours. BNP (last 3 results) Recent Labs    10/20/23 1554 11/10/23 2127 12/13/23 1616  PROBNP 457.0* 476.0* 6,019.0*   HbA1C: No results for input(s): HGBA1C in the last 72 hours. CBG:  No results for input(s): GLUCAP in the last 168 hours. Lipid Profile: No results for input(s): CHOL, HDL, LDLCALC, TRIG, CHOLHDL, LDLDIRECT in the last 72 hours. Thyroid  Function Tests: No results for input(s): TSH, T4TOTAL, FREET4, T3FREE, THYROIDAB in the last 72 hours. Anemia Panel: No results for input(s): VITAMINB12, FOLATE, FERRITIN, TIBC, IRON, RETICCTPCT in the last 72 hours. Sepsis Labs: Recent Labs  Lab 12/13/23 1615  LATICACIDVEN 0.9    Recent Results (from the past 240 hours)  Resp panel by RT-PCR (RSV, Flu A&B, Covid) Anterior Nasal Swab     Status: None   Collection Time: 12/13/23 12:32 AM   Specimen: Anterior Nasal Swab  Result Value Ref Range Status   SARS Coronavirus 2 by RT PCR NEGATIVE NEGATIVE Final    Comment: (NOTE) SARS-CoV-2 target nucleic acids are NOT DETECTED.  The SARS-CoV-2 RNA is generally detectable in upper respiratory specimens during the acute phase of infection. The lowest concentration of SARS-CoV-2 viral copies this assay can detect is 138 copies/mL. A negative result does not preclude SARS-Cov-2 infection and should  not be used as the sole basis for treatment or other patient management decisions. A negative result may occur with  improper specimen collection/handling, submission of specimen other than nasopharyngeal swab, presence of viral mutation(s) within the areas targeted by this assay, and inadequate number of viral copies(<138 copies/mL). A negative result must be combined with clinical observations, patient history, and epidemiological information. The expected result is Negative.  Fact Sheet for Patients:  bloggercourse.com  Fact Sheet for Healthcare Providers:  seriousbroker.it  This test is no t yet approved or cleared by the [place]  FDA and  has been authorized for detection and/or diagnosis of SARS-CoV-2 by FDA under an Emergency Use Authorization (EUA). This EUA will remain  in effect (meaning this test can be used) for the duration of the COVID-19 declaration under Section 564(b)(1) of the Act, 21 U.S.C.section 360bbb-3(b)(1), unless the authorization is terminated  or revoked sooner.       Influenza A by PCR NEGATIVE NEGATIVE Final   Influenza B by PCR NEGATIVE NEGATIVE Final    Comment: (NOTE) The Xpert Xpress SARS-CoV-2/FLU/RSV plus assay is intended as an aid in the diagnosis of influenza from Nasopharyngeal swab specimens and should not be used as a sole basis for treatment. Nasal washings and aspirates are unacceptable for Xpert Xpress SARS-CoV-2/FLU/RSV testing.  Fact Sheet for Patients: bloggercourse.com  Fact Sheet for Healthcare Providers: seriousbroker.it  This test is not yet approved or cleared by the [place]  FDA and has been authorized for detection and/or diagnosis of SARS-CoV-2 by FDA under an Emergency Use Authorization (EUA). This EUA will remain in effect (meaning this test can be used) for the duration of the COVID-19 declaration under  Section 564(b)(1) of the Act, 21 U.S.C. section 360bbb-3(b)(1), unless the authorization is terminated or revoked.     Resp Syncytial Virus by PCR NEGATIVE NEGATIVE Final    Comment: (NOTE) Fact Sheet for Patients: bloggercourse.com  Fact Sheet for Healthcare Providers: seriousbroker.it  This test is not yet approved or cleared by the [place]  FDA and has been authorized for detection and/or diagnosis of SARS-CoV-2 by FDA under an Emergency Use Authorization (EUA). This EUA will remain in effect (meaning this test can be used) for the duration of the COVID-19 declaration under Section 564(b)(1) of the Act, 21 U.S.C. section 360bbb-3(b)(1), unless the authorization is terminated or revoked.  Performed at [facility], [address], [place], [place] [address]  Blood Culture (routine x 2)     Status: None (Preliminary result)   Collection Time: 12/13/23  4:16 PM   Specimen: BLOOD  Result Value Ref Range Status   Specimen Description BLOOD LEFT ANTECUBITAL  Final   Special Requests   Final    BOTTLES DRAWN AEROBIC AND ANAEROBIC Blood Culture adequate volume   Culture   Final    NO GROWTH 2 DAYS Performed at [facility], [address] [place], [place] [address]    Report Status PENDING  Incomplete  Blood Culture (routine x 2)     Status: None (Preliminary result)   Collection Time: 12/13/23  4:16 PM   Specimen: BLOOD  Result Value Ref Range Status   Specimen Description BLOOD RIGHT ANTECUBITAL  Final   Special Requests   Final    BOTTLES DRAWN AEROBIC AND ANAEROBIC Blood Culture adequate volume   Culture   Final    NO GROWTH 2 DAYS Performed at [facility], [address] [place], [place] [address]    Report Status PENDING  Incomplete  MRSA Next Gen by PCR, Nasal     Status: Abnormal   Collection Time: 12/14/23  7:20 AM   Specimen: Nasal Mucosa; Nasal Swab  Result Value Ref Range  Status   MRSA by PCR Next Gen DETECTED (A) NOT DETECTED Final    Comment: RESULT CALLED TO, READ BACK BY AND VERIFIED WITH: ALEX JONES 12/14/23 1005 MW (NOTE) The GeneXpert MRSA Assay (FDA approved for NASAL specimens only), is one component of a comprehensive MRSA colonization surveillance program. It is not intended to diagnose MRSA infection nor to guide or monitor treatment for MRSA infections. Test performance is not FDA approved in patients less than 34 years old. Performed at [facility], [address] [place], [place] [address]          Radiology Studies: ECHOCARDIOGRAM COMPLETE Result Date: 12/14/2023    ECHOCARDIOGRAM REPORT   Patient Name:   [name] Date of Exam: 12/14/2023 Medical Rec #:  [id number]     Height:       61.0 in Accession #:    [id number]    Weight:       115.1 lb Date of Birth:  [birth year]     BSA:          1.493 m Patient Age:    89 years      BP:           100/60 mmHg Patient Gender: F             HR:           94 bpm. Exam Location:  [facility] Procedure: 2D Echo, Cardiac Doppler and Color Doppler (Both Spectral and Color            Flow Doppler were utilized during procedure). Indications:     CHF---acute diastolic I50.31  History:         Patient has prior history of Echocardiogram examinations, most                  recent 04/24/2022. COPD; Risk Factors:Dyslipidemia. Pulmonary                  fibrosis.  Sonographer:     Christopher Furnace Referring Phys:  [id number] XILIN NIU Diagnosing Phys: Evalene Lunger MD  Sonographer Comments: Image acquisition challenging due to patient body habitus and Image acquisition challenging due to COPD. IMPRESSIONS  1. Left ventricular ejection  fraction, by estimation, is 60 to 65%. The left ventricle has normal function. The left ventricle has no regional wall motion abnormalities. Left ventricular diastolic parameters are consistent with Grade I diastolic dysfunction (impaired relaxation).  2. Right ventricular systolic function  is normal. The right ventricular size is normal. There is normal pulmonary artery systolic pressure. The estimated right ventricular systolic pressure is 33.9 mmHg.  3. Left atrial size was mildly dilated.  4. The mitral valve is normal in structure. Mild mitral valve regurgitation. No evidence of mitral stenosis.  5. Tricuspid valve regurgitation is mild to moderate.  6. The aortic valve is normal in structure. There is mild calcification of the aortic valve. Aortic valve regurgitation is not visualized. Aortic valve sclerosis/calcification is present, without any evidence of aortic stenosis. Aortic valve mean gradient measures 2.0 mmHg.  7. The inferior vena cava is normal in size with greater than 50% respiratory variability, suggesting right atrial pressure of 3 mmHg. FINDINGS  Left Ventricle: Left ventricular ejection fraction, by estimation, is 60 to 65%. The left ventricle has normal function. The left ventricle has no regional wall motion abnormalities. Strain was performed and the global longitudinal strain is indeterminate. The left ventricular internal cavity size was normal in size. There is no left ventricular hypertrophy. Left ventricular diastolic parameters are consistent with Grade I diastolic dysfunction (impaired relaxation). Right Ventricle: The right ventricular size is normal. No increase in right ventricular wall thickness. Right ventricular systolic function is normal. There is normal pulmonary artery systolic pressure. The tricuspid regurgitant velocity is 2.69 m/s, and  with an assumed right atrial pressure of 5 mmHg, the estimated right ventricular systolic pressure is 33.9 mmHg. Left Atrium: Left atrial size was mildly dilated. Right Atrium: Right atrial size was normal in size. Pericardium: There is no evidence of pericardial effusion. Mitral Valve: The mitral valve is normal in structure. There is mild calcification of the mitral valve leaflet(s). Mild mitral valve regurgitation. No  evidence of mitral valve stenosis. MV peak gradient, 4.4 mmHg. The mean mitral valve gradient is 2.0 mmHg. Tricuspid Valve: The tricuspid valve is normal in structure. Tricuspid valve regurgitation is mild to moderate. No evidence of tricuspid stenosis. Aortic Valve: The aortic valve is normal in structure. There is mild calcification of the aortic valve. Aortic valve regurgitation is not visualized. Aortic valve sclerosis/calcification is present, without any evidence of aortic stenosis. Aortic valve mean gradient measures 2.0 mmHg. Aortic valve peak gradient measures 5.0 mmHg. Aortic valve area, by VTI measures 3.88 cm. Pulmonic Valve: The pulmonic valve was normal in structure. Pulmonic valve regurgitation is not visualized. No evidence of pulmonic stenosis. Aorta: The aortic root is normal in size and structure. Venous: The inferior vena cava is normal in size with greater than 50% respiratory variability, suggesting right atrial pressure of 3 mmHg. IAS/Shunts: No atrial level shunt detected by color flow Doppler. Additional Comments: 3D was performed not requiring image post processing on an independent workstation and was indeterminate.  LEFT VENTRICLE PLAX 2D LVIDd:         3.30 cm   Diastology LVIDs:         2.20 cm   LV e' medial:    5.11 cm/s LV PW:         1.00 cm   LV E/e' medial:  15.5 LV IVS:        0.90 cm   LV e' lateral:   4.46 cm/s LVOT diam:     2.10 cm  LV E/e' lateral: 17.8 LV SV:         58 LV SV Index:   39 LVOT Area:     3.46 cm LV IVRT:       77 msec  RIGHT VENTRICLE RV Basal diam:  3.10 cm RV Mid diam:    2.10 cm RV S prime:     6.85 cm/s TAPSE (M-mode): 1.5 cm LEFT ATRIUM           Index        RIGHT ATRIUM          Index LA diam:      2.20 cm 1.47 cm/m   RA Area:     8.54 cm LA Vol (A2C): 49.2 ml 32.95 ml/m  RA Volume:   17.40 ml 11.65 ml/m LA Vol (A4C): 41.1 ml 27.52 ml/m  AORTIC VALVE AV Area (Vmax):    2.79 cm AV Area (Vmean):   3.14 cm AV Area (VTI):     3.88 cm AV Vmax:            112.00 cm/s AV Vmean:          67.800 cm/s AV VTI:            0.150 m AV Peak Grad:      5.0 mmHg AV Mean Grad:      2.0 mmHg LVOT Vmax:         90.10 cm/s LVOT Vmean:        61.400 cm/s LVOT VTI:          0.168 m LVOT/AV VTI ratio: 1.12  AORTA Ao Root diam: 3.30 cm MITRAL VALVE                TRICUSPID VALVE MV Area (PHT): 2.31 cm     TR Peak grad:   28.9 mmHg MV Area VTI:   2.21 cm     TR Vmax:        269.00 cm/s MV Peak grad:  4.4 mmHg MV Mean grad:  2.0 mmHg     SHUNTS MV Vmax:       1.05 m/s     Systemic VTI:  0.17 m MV Vmean:      67.8 cm/s    Systemic Diam: 2.10 cm MV Decel Time: 329 msec MV E velocity: 79.30 cm/s MV A velocity: 107.00 cm/s MV E/A ratio:  0.74 Evalene Lunger MD Electronically signed by Evalene Lunger MD Signature Date/Time: 12/14/2023/3:35:18 PM    Final    DG Chest Port 1 View Result Date: 12/13/2023 CLINICAL DATA:  Possible sepsis EXAM: PORTABLE CHEST 1 VIEW COMPARISON:  Prior chest CT 11/27/2022; prior chest x-ray 11/10/2023 FINDINGS: Lower inspiratory volumes with significantly increased diffuse interstitial and airspace opacities bilaterally. Stable cardiomegaly and large hiatal hernia. Very limited chest x-ray. No acute osseous abnormality. IMPRESSION: Lower inspiratory volumes with significantly increased diffuse interstitial and airspace opacities bilaterally. Differential considerations include CHF versus atypical or viral pneumonia. Electronically Signed   By: Wilkie Lent M.D.   On: 12/13/2023 16:25   CT Head Wo Contrast Result Date: 12/13/2023 EXAM: CT HEAD WITHOUT 12/13/2023 03:43:45 PM TECHNIQUE: CT of the head was performed without the administration of intravenous contrast. Automated exposure control, iterative reconstruction, and/or weight based adjustment of the mA/kV was utilized to reduce the radiation dose to as low as reasonably achievable. COMPARISON: Head CT 10/14/2023. CLINICAL HISTORY: Neuro deficit, acute, stroke suspected. FINDINGS: BRAIN AND  VENTRICLES: There is no evidence of an acute infarct,  acute intracranial hemorrhage, midline shift, hydrocephalus, or extra-axial fluid collection. There is mild cerebral atrophy. Confluent hypodensities in the cerebral white matter bilaterally are similar to the prior CT and nonspecific but compatible with extensive chronic small vessel ischemic disease. A subcentimeter hyperdense focus/possible calcification in the posterior right frontal white matter is partially obscured by streak artifact but is unchanged from multiple prior examinations. Calcified atherosclerosis at the skull base. ORBITS: Right cataract extraction. SINUSES AND MASTOIDS: Persistent complete opacification of the right mastoid air cells and right middle ear cavity. Well aerated paranasal sinuses. SOFT TISSUES AND SKULL: No acute skull fracture. No acute soft tissue abnormality. IMPRESSION: 1. No acute intracranial abnormality. 2. Extensive chronic small vessel ischemic disease. 3. Chronic right mastoid and middle ear effusions. Electronically signed by: Dasie Hamburg MD 12/13/2023 04:15 PM EST RP Workstation: [id number]        Scheduled Meds:  ALPRAZolam   0.5 mg Oral BID   aspirin  EC  81 mg Oral Daily   azithromycin   500 mg Oral Daily   donepezil   10 mg Oral QHS   enoxaparin  (LOVENOX ) injection  30 mg Subcutaneous QHS   furosemide   40 mg Intravenous Daily   loratadine   10 mg Oral Daily   memantine   10 mg Oral BID   midodrine   10 mg Oral TID WC   multivitamin with minerals   Oral Daily   mupirocin  ointment  1 Application Nasal BID   pantoprazole   40 mg Oral BID   simvastatin   10 mg Oral QHS   Continuous Infusions:  ceFEPime  (MAXIPIME ) IV Stopped (12/14/23 1705)   vancomycin  750 mg (12/15/23 0508)     LOS: 2 days      Devaughn KATHEE Ban, MD [facility]  If 7PM-7AM, please contact night-coverage [email or web address] 12/15/2023, 2:29 PM

## 2023-12-15 NOTE — Evaluation (Signed)
 Physical Therapy Evaluation Patient Details Name: [name] MRN: [id number] DOB: September 07, 1934 Today's Date: 12/15/2023  History of Present Illness  Pt is a 89 y.o. female with medical history significant of dCHF,  HLD, COPD, Myocardial injury, pulmonary fibrosis, dementia, CKD-3B, anxiety, anemia, chronic pain, IBS, chronic hypotension, under hospice, and gen weakness. MD assessment includes: acute on chronic CHF, COPD, mass on left neck posteriorly.  Clinical Impression  Pt was lying in bed on arrival, was pleasant and motivated to participate during the session and put forth good effort throughout. Pt was able to mobilize to EOB with mod A (see details below), sit EOB unsupported and stand briefly at bedside using RW with min-mod A +2 for safety. Pt reported feeling weak and unsteady and requested to return to bed. Pt need mod A for LE negotiation when getting back to supine. Pts SpO2 and HR WNL throughout on 2L [place], no adverse symptoms other than fatigue noted. Pt will benefit from continued PT services upon discharge to safely address deficits listed in patient problem list for decreased caregiver assistance and eventual return to PLOF.       If plan is discharge home, recommend the following: A lot of help with walking and/or transfers;A lot of help with bathing/dressing/bathroom;Assist for transportation;Assistance with cooking/housework;Help with stairs or ramp for entrance   Can travel by private vehicle   No    Equipment Recommendations    Recommendations for Other Services       Functional Status Assessment Patient has had a recent decline in their functional status and demonstrates the ability to make significant improvements in function in a reasonable and predictable amount of time.     Precautions / Restrictions Precautions Precautions: Fall Recall of Precautions/Restrictions: Impaired Restrictions Weight Bearing Restrictions Per Provider Order: No      Mobility  Bed  Mobility Overal bed mobility: Needs Assistance Bed Mobility: Supine to Sit, Sit to Supine     Supine to sit: Mod assist Sit to supine: Mod assist   General bed mobility comments: pt needed mod assist for LE negotion to EOB and trunk support. Pt needed max cueing for sequecing -- but was able to use bed rails to help pull herself to sitting, and support herself in static sitting at EOB with heavy cueing     Transfers Overall transfer level: Needs assistance Equipment used: Rolling walker (2 wheels) Transfers: Sit to/from Stand Sit to Stand: Mod assist, +2 physical assistance           General transfer comment: pt needed help with hand placement and sequencing for STS with bed slightly evelated. Pt has sig posterior lean and is kyphotic    Ambulation/Gait: attempted lateral steps pt unable                   Stairs            Wheelchair Mobility     Tilt Bed    Modified Rankin (Stroke Patients Only)       Balance Overall balance assessment: Needs assistance Sitting-balance support: Feet supported, Single extremity supported, No upper extremity supported (initally she needed UE support but was able to sit upright eventually with feet supported on floor) Sitting balance-Leahy Scale: Fair Sitting balance - Comments: posterior lean, kyphotic Postural control: Posterior lean Standing balance support: Bilateral upper extremity supported, During functional activity, Reliant on assistive device for balance Standing balance-Leahy Scale: Poor Standing balance comment: pt was able to stand bedside with min-mod +2  Pertinent Vitals/Pain Pain Assessment Pain Assessment: No/denies pain    Home Living Family/patient expects to be discharged to:: Private residence Living Arrangements: Children Available Help at Discharge: Personal care attendant;Family;Available 24 hours/day Type of Home: Mobile home Home Access: Ramped  entrance       Home Layout: One level Home Equipment: Shower seat;Hand held shower head;Grab bars - tub/shower;Grab bars - toilet;Rolling Walker (2 wheels);Hospital bed;Wheelchair - manual;BSC/3in1 Additional Comments: Pt reported daughter helps 24/7 ---all other hx mainly from previous documentation due to pts poor cog   Prior Function Prior Level of Function : Needs assist       Physical Assist : ADLs (physical);Mobility (physical) Mobility (physical): Bed mobility;Transfers;Gait;Stairs ADLs (physical): Dressing;Bathing Mobility Comments: household ambulator with RW and supervision ADLs Comments: assisted by daughter and PCA     Extremity/Trunk Assessment   Upper Extremity Assessment Upper Extremity Assessment: Defer to OT evaluation    Lower Extremity Assessment Lower Extremity Assessment: Generalized weakness    Cervical / Trunk Assessment Cervical / Trunk Assessment: Kyphotic  Communication   Communication Communication: Impaired Factors Affecting Communication: Reduced clarity of speech;Hearing impaired    Cognition Arousal: Lethargic Behavior During Therapy: Flat affect   PT - Cognitive impairments: History of cognitive impairments, No family/caregiver present to determine baseline, Orientation, Sequencing, Problem solving, Safety/Judgement, Awareness, Memory, Attention, Initiation   Orientation impairments: Time, Place                     Following commands: Impaired Following commands impaired: Follows one step commands with increased time     Cueing Cueing Techniques: Verbal cues, Gestural cues, Tactile cues, Visual cues     General Comments General comments (skin integrity, edema, etc.): pt was able to take a couple of steps toward [facility] using RW and min-mod +2 for safety    Exercises     Assessment/Plan    PT Assessment Patient needs continued PT services  PT Problem List Decreased strength;Decreased range of motion;Decreased activity  tolerance;Decreased balance;Decreased mobility;Decreased coordination;Decreased cognition;Decreased knowledge of use of DME;Decreased safety awareness       PT Treatment Interventions DME instruction;Gait training;Stair training;Functional mobility training;Therapeutic activities;Therapeutic exercise;Balance training;Cognitive remediation;Patient/family education    PT Goals (Current goals can be found in the Care Plan section)       Frequency Min 2X/week     Co-evaluation               AM-PAC PT 6 Clicks Mobility  Outcome Measure Help needed turning from your back to your side while in a flat bed without using bedrails?: A Lot Help needed moving from lying on your back to sitting on the side of a flat bed without using bedrails?: A Lot Help needed moving to and from a bed to a chair (including a wheelchair)?: A Lot Help needed standing up from a chair using your arms (e.g., wheelchair or bedside chair)?: A Lot Help needed to walk in hospital room?: Total Help needed climbing 3-5 steps with a railing? : Total 6 Click Score: 10    End of Session Equipment Utilized During Treatment: Gait belt Activity Tolerance: Patient limited by lethargy Patient left: in bed;with call bell/phone within reach;with bed alarm set Nurse Communication: Mobility status PT Visit Diagnosis: Unsteadiness on feet (R26.81);Difficulty in walking, not elsewhere classified (R26.2);Muscle weakness (generalized) (M62.81)    Time: 9054-8995 PT Time Calculation (min) (ACUTE ONLY): 19 min   Charges:  Corean Newport, SPT 12/15/2023, 2:01 PM

## 2023-12-15 NOTE — TOC Initial Note (Signed)
 Transition of Care [facility]) - Initial/Assessment Note    Patient Details  Name: [name] MRN: [id number] Date of Birth: [birth year]  [facility] CM/SW Contact:    Alfonso Rummer, LCSW Phone Number: 12/15/2023, 3:08 PM  Clinical Narrative:                 Pt from home. Currently followed by [facility] hospice. Receive notice for [facility] consult to discuss transitioning to snf .  Update 12/15/23 @ 3:31pm: LCSW A Seham Gardenhire advise pt daughter Ms Richwine can not participate in hospice services and short term rehab at a skilled nursing facility. Pt daughter express understanding and reports she wants pt to engage in pt and ot while inpatient at [facility]. A family is not considering str at this time.        Patient Goals and CMS Choice            Expected Discharge Plan and Services                                              Prior Living Arrangements/Services                       Activities of Daily Living   ADL Screening (condition at time of admission) Independently performs ADLs?: No Does the patient have a NEW difficulty with bathing/dressing/toileting/self-feeding that is expected to last >3 days?: Yes (Initiates electronic notice to provider for possible OT consult) Does the patient have a NEW difficulty with getting in/out of bed, walking, or climbing stairs that is expected to last >3 days?: Yes (Initiates electronic notice to provider for possible PT consult) Does the patient have a NEW difficulty with communication that is expected to last >3 days?: No Is the patient deaf or have difficulty hearing?: Yes Does the patient have difficulty seeing, even when wearing glasses/contacts?: No Does the patient have difficulty concentrating, remembering, or making decisions?: No  Permission Sought/Granted                  Emotional Assessment              Admission diagnosis:  Weakness [R53.1] Acute on chronic combined systolic and  diastolic congestive heart failure (HCC) [I50.43] Acute respiratory failure with hypoxia (HCC) [J96.01] Acute on chronic diastolic CHF (congestive heart failure) (HCC) [I50.33] Urinary tract infection without hematuria, site unspecified [N39.0] Patient Active Problem List   Diagnosis Date Noted   Acute on chronic diastolic CHF (congestive heart failure) (HCC) 12/13/2023   Pulmonary fibrosis (HCC) 12/13/2023   Depression with anxiety 12/13/2023   Dementia without behavioral disturbance (HCC) 12/13/2023   Myocardial injury 12/13/2023   Chronic kidney disease, stage 3b (HCC) 12/13/2023   Bilateral foot pain 11/12/2023   Bilateral swelling of feet 11/11/2023   Cellulitis of left foot 10/15/2023   Acute metabolic encephalopathy 10/15/2023   AKI (acute kidney injury) 11/28/2022   Hyperkalemia 11/28/2022   Hypotension 08/28/2022   Dehydration 08/28/2022   Hypokalemia 08/28/2022   Hip pain 08/28/2022   Lactic acidosis 08/28/2022   Closed fracture of right distal radius 07/28/2022   Pressure injury of skin 07/26/2022   Closed L1 vertebral fracture (HCC) 07/26/2022   Fall 07/25/2022   Compression fracture of L1,L2 vertebra (HCC) 07/25/2022   Influenza A 04/28/2022   Hypoxia 04/23/2022   Anemia, unspecified 11/23/2021  Long term (current) use of inhaled steroids 11/23/2021   Unspecified dementia, unspecified severity, with mood disturbance (HCC) 11/23/2021   Fracture of femoral neck, left, closed (HCC) 09/24/2021   Acute respiratory failure with hypoxia (HCC) 06/10/2021   Tobacco use disorder 06/10/2021   Generalized weakness 06/10/2021   Bilateral conjunctivitis 06/10/2021   Chronic diastolic CHF (congestive heart failure) (HCC) 05/14/2021   Depression 05/14/2021   Hyperlipidemia    COPD exacerbation (HCC) 05/13/2021   Long term (current) use of opiate analgesic 01/04/2021   Chronic pain 04/23/2019   General unsteadiness 04/23/2019   Polyarthropathy 04/23/2019   Pseudobulbar  affect 04/23/2019   Tremor 04/23/2019   Chronic obstructive pulmonary disease, unspecified (HCC)    Gastro-esophageal reflux disease without esophagitis    Hyperlipidemia, unspecified    Pulmonary fibrosis, unspecified (HCC)    Acute on chronic diastolic (congestive) heart failure (HCC)    Age-related osteoporosis without current pathological fracture    Anxiety disorder, unspecified    Chest pain, unspecified    Chronic diastolic (congestive) heart failure (HCC)    Collapsed vertebra, not elsewhere classified, site unspecified, subsequent encounter for fracture with routine healing    Edema, unspecified    Lumbago with sciatica    Overactive bladder    Pain in unspecified hand    Pressure ulcer of unspecified site, unspecified stage    Unspecified protein-calorie malnutrition    Acute diastolic heart failure (HCC) 09/30/2018   Dyspnea 09/29/2018   Pneumonia 06/30/2018   Acute CHF (congestive heart failure) (HCC) 06/29/2018   Vascular dementia without behavioral disturbance (HCC) 06/26/2018   CAP (community acquired pneumonia) 06/23/2018   Bilateral pleural effusion 06/23/2018   Leukocytosis 06/20/2018   Chronic anemia 06/20/2018   Dementia (HCC) 06/19/2018   Bilateral lower extremity edema 06/18/2018   Protein-calorie malnutrition, severe 06/18/2018   S/P internal fixation right hip fracture cannulated screw placement 04/06/18 04/27/2018   UTI (urinary tract infection) 04/11/2018   Altered mental status 04/10/2018   Anxiety 04/10/2018   Hyponatremia 04/10/2018   UTI (urinary tract infection) due to urinary indwelling catheter 04/10/2018   Chronic constipation 04/09/2018   Urinary retention 04/09/2018   Closed right hip fracture (HCC) 04/05/2018   GERD (gastroesophageal reflux disease) 04/05/2018   COPD (chronic obstructive pulmonary disease) (HCC) 04/05/2018   Hiatal hernia    Dyspepsia 04/04/2014   Hematochezia 04/30/2011   RUQ pain 03/25/2011   Adenomatous polyps  03/25/2011   ABDOMINAL PAIN, LEFT LOWER QUADRANT 10/16/2009   HYPERLIPIDEMIA 10/09/2009   BRONCHITIS 10/09/2009   Osteoporosis 10/09/2009   Personal history of pneumonia (recurrent) 01/05/1999   Mixed hyperlipidemia 01/05/1999   PCP:  [name], MD Pharmacy:   [facility] - [place], [place] - [address] [place] [address] Phone: [phone] Fax: [phone]  [facility] [phone] - [place], [place] - [address] AT [facility] [address] [place] [address] Phone: [phone] Fax: [phone]  [facility] [address] [place] [address] Phone: [phone] Fax: [phone]     Social Drivers of Health (SDOH) Social History: SDOH Screenings   Food Insecurity: Patient Unable To Answer (12/14/2023)  Housing: Low Risk (11/11/2023)  Transportation Needs: No Transportation Needs (11/11/2023)  Utilities: Not At Risk (11/11/2023)  Financial Resource Strain: Low Risk  (08/11/2023)   Received from [facility]  Social Connections: Patient Declined (11/11/2023)  Tobacco Use:  High Risk (12/13/2023)   SDOH Interventions:     Readmission Risk Interventions    11/29/2022    1:31 PM 07/26/2022   12:28 PM 09/30/2021    9:22 AM  Readmission Risk Prevention Plan  Transportation Screening Complete Complete Complete  PCP or Specialist Appt within 5-7 Days  Not Complete   Home Care Screening  Complete   Medication Review (RN CM)  Complete   Medication Review Oceanographer) Complete  Complete  HRI or Home Care Consult Complete  Complete  SW Recovery Care/Counseling Consult Complete  Complete  Palliative Care Screening Not Applicable  Not Applicable  Skilled Nursing Facility Not Applicable  Complete

## 2023-12-15 NOTE — Care Management Important Message (Signed)
 Important Message  Patient Details  Name: [name] MRN: [id number] Date of Birth: 08-09-1934   Important Message Given:  Yes - Medicare IM     Rojelio [name] 12/15/2023, 1:05 PM

## 2023-12-15 NOTE — Progress Notes (Addendum)
 [facility] rm 240 [facility] patient visit   [name] is a current [facility] patient with a terminal diagnosis of cerebrovascular disease. Prior to 2 days ago patient was able to ambulate independently with 2 wheeled walker however within the last 2 days she had declined to the point of not even being able to hold cup or feed self. Family made decision for further evaluation in ED due to this rapid decline. Patient was admitted with diagnosis of acute on chronic CHF. Per Dr. Eric Showman this is a hospice related admission. Patient is a DNR.    Visited patient at bedside. She was transferring to the side of the bed with Physical therapy assisting.  Patient's daughter, [name], stated today that she is hopeful the IV antibiotics help.     She has received IV fluids as well as IV antibiotics which will continue and is GIP appropriate for this reason.   Vital Signs- 98.5, 128, 16,  82/61   spO2 97% on 2L/min of 02  I&O- 480/150  Abnormal Labs- sodium 130, chloride 93, Glucose 103, BUN 35, Creatinine 1.36, Calcium  8.4, GFR 38, WBC 13.2, RBC 3.46, Hemoglobin 9.9, HCT 30.2   Diagnostics- none new   IV/PRN Meds- Vancomycin  750mg  q 48hrs IV, Maxipime  2g q 24hr IV,    Current plan as per Dr. Devaughn Ban 12.11.25 Principal Problem:   Acute on chronic diastolic CHF (congestive heart failure) (HCC) Active Problems:   Myocardial injury   Leukocytosis   COPD (chronic obstructive pulmonary disease) (HCC)   Pulmonary fibrosis (HCC)   UTI (urinary tract infection)   Hypotension   Chronic kidney disease, stage 3b (HCC)   Hyperlipidemia   Chronic pain   Dementia without behavioral disturbance (HCC)   Depression with anxiety   Assessment and Plan:   Acute on chronic diastolic CHF: has trace leg edema, but has significantly elevated proBNP 6019, positive JVD. Continue on IV lasix . Monitor I/Os. Fluid restriction    Myocardial injury: Trop  34 --> 29.  Likely  demand ischemia.   Stroke concern: today family tells me there was an abrupt change in patient's functional status, trouble swallowing, facial droop, that accompanied her initial presentation symptoms.    Possible CAP: no significant respiratory symptms. Possible ILD at recent pulm visit. Will de-escalate abx today to cefepime . Legionella is pending. Blood cxs NGTD. Trial off oxygen today. F/u rvp.    Hx of pulmonary fibrosis: continue w/ supportive care. Add oral prednisone    Hx of COPD: bronchodilators prn. Encourage incentive spirometry    Possible UTI: urine cx is pending. Continue on IV abxs   Hypotension: chronic. Improved. Continue on midodrine     CKDIIIb: stable   HLD: continue on statin    Chronic pain: oxy prn    Dementia: continue on home dose of namenda , donepezil     Anxiety : severity unknown. Continue on home dose of xanax    Mass in left neck:  approximately 6-7 cm in size. Chronic issue. F/u with PCP as outpt   Hospice patient: family interested in snf, will ask toc to discuss, will also order palliative consult for goals of care discussion  Discharge Planning- Ongoing, may go home once medically stable, but possible need for SNF rehab first.   Family Contact-Spoke with daughter today.  Hopeful antibiotics will help.    IDT: updated Goals of Care: DNR Transfer summary and Med list sent to be placed under Media  Thank you for the opportunity to participate in  this patient's care, please don't hesitate to call for any hospice related questions or concerns.   [facility] Liaison  [phone]

## 2023-12-15 NOTE — Evaluation (Signed)
 Occupational Therapy Evaluation Patient Details Name: [name] MRN: [id number] DOB: [birth year] Today's Date: 12/15/2023   History of Present Illness   Pt is a 88 y.o. female with medical history significant of dCHF,  HLD, COPD, MI, pulmonary fibrosis, dementia, CKD-3B, anxiety, anemia, chronic pain, IBS, chronic hypotension, under hospice, and gen weakness. MD assessment includes: acute on chronic CHF, MI, COPD, mass on left neck posteriorly.     Clinical Impressions Patient presenting with decreased Ind in self care,balance, functional mobility, transfers, endurance, and safety awareness. Pt is very fatigued and lethargic during session. Nursing reports pt recently getting cleaned up from BM at bed level. Pt endorses living at home with family and having PCA to assist with self care tasks. She is normally able to ambulate with RW. Pt is very weak at this time. Max A for bed mobility. Min A to wash hands at bed level with warm cloth. Pt having difficulty remaining awake and very different from baseline. Unable to attempt transfer or EOB secondary to lethargy and safety concerns. Patient will benefit from acute OT to increase overall independence in the areas of ADLs, functional mobility, and safety awareness in order to safely discharge.     If plan is discharge home, recommend the following:   A lot of help with walking and/or transfers;A lot of help with bathing/dressing/bathroom;Assist for transportation;Supervision due to cognitive status;Assistance with feeding;Assistance with cooking/housework;Help with stairs or ramp for entrance     Functional Status Assessment   Patient has had a recent decline in their functional status and demonstrates the ability to make significant improvements in function in a reasonable and predictable amount of time.     Equipment Recommendations   Other (comment) (defer)     Recommendations for Other Services          Precautions/Restrictions   Precautions Precautions: Fall Recall of Precautions/Restrictions: Impaired     Mobility Bed Mobility Overal bed mobility: Needs Assistance Bed Mobility: Rolling Rolling: Max assist              Transfers                   General transfer comment: unable to attempt secondary to fatigue      Balance                                           ADL either performed or assessed with clinical judgement   ADL Overall ADL's : Needs assistance/impaired     Grooming: Wash/dry hands;Minimal assistance;Bed level                                       Vision Patient Visual Report: No change from baseline       Perception         Praxis         Pertinent Vitals/Pain Pain Assessment Pain Assessment: No/denies pain     Extremity/Trunk Assessment Upper Extremity Assessment Upper Extremity Assessment: Generalized weakness   Lower Extremity Assessment Lower Extremity Assessment: Generalized weakness   Cervical / Trunk Assessment Cervical / Trunk Assessment: Kyphotic   Communication Communication Communication: Impaired Factors Affecting Communication: Reduced clarity of speech;Hearing impaired   Cognition Arousal: Lethargic Behavior During Therapy: Flat affect  Following commands: Impaired Following commands impaired: Follows one step commands with increased time     Cueing  General Comments   Cueing Techniques: Verbal cues;Gestural cues;Tactile cues;Visual cues  pt was able to take a couple of steps toward [facility] using RW and min-mod +2 for safety   Exercises     Shoulder Instructions      Home Living Family/patient expects to be discharged to:: Private residence Living Arrangements: Children Available Help at Discharge: Personal care attendant;Family;Available 24 hours/day Type of Home: Mobile home Home Access: Ramped entrance      Home Layout: One level     Bathroom Shower/Tub: Producer, Television/film/video: Handicapped height Bathroom Accessibility: Yes   Home Equipment: Shower seat;Hand held shower head;Grab bars - tub/shower;Grab bars - toilet;Rolling Walker (2 wheels);Hospital bed;Wheelchair - manual;BSC/3in1   Additional Comments: Pt reported daughter helps 24/7      Prior Functioning/Environment Prior Level of Function : Needs assist       Physical Assist : ADLs (physical);Mobility (physical) Mobility (physical): Bed mobility;Transfers;Gait;Stairs ADLs (physical): Dressing;Bathing Mobility Comments: household ambulator with RW and supervision ADLs Comments: assisted by daughter and PCA    OT Problem List: Decreased strength;Impaired balance (sitting and/or standing);Decreased safety awareness;Decreased activity tolerance   OT Treatment/Interventions: Self-care/ADL training;Therapeutic exercise;Therapeutic activities;Energy conservation      OT Goals(Current goals can be found in the care plan section)   Acute Rehab OT Goals Patient Stated Goal: to go home OT Goal Formulation: With patient Time For Goal Achievement: 12/29/23 Potential to Achieve Goals: Fair ADL Goals Pt Will Perform Grooming: sitting;with supervision Pt Will Perform Lower Body Dressing: with mod assist;sit to/from stand Pt Will Transfer to Toilet: with mod assist;bedside commode Pt Will Perform Toileting - Clothing Manipulation and hygiene: with mod assist;sit to/from stand   OT Frequency:  Min 2X/week    Co-evaluation              AM-PAC OT 6 Clicks Daily Activity     Outcome Measure Help from another person eating meals?: A Little Help from another person taking care of personal grooming?: A Little Help from another person toileting, which includes using toliet, bedpan, or urinal?: Total Help from another person bathing (including washing, rinsing, drying)?: Total Help from another person to put on and  taking off regular upper body clothing?: A Lot Help from another person to put on and taking off regular lower body clothing?: Total 6 Click Score: 11   End of Session Nurse Communication: Mobility status  Activity Tolerance: Patient limited by fatigue;Patient limited by lethargy Patient left: in bed  OT Visit Diagnosis: Muscle weakness (generalized) (M62.81)                Time: 8877-8861 OT Time Calculation (min): 16 min Charges:  OT General Charges $OT Visit: 1 Visit OT Evaluation $OT Eval Moderate Complexity: [address], MS, OTR/L , CBIS ascom [phone]  12/15/2023, 2:41 PM

## 2023-12-15 NOTE — Progress Notes (Signed)
 Heart Failure Navigator Progress Note  Assessed for Heart & Vascular TOC clinic readiness.  Unfortunately, this patient does not meet criteria due to current hospice status, dementia diagnosis. Her generalized weakness: inability to feed herself and needs to be picked up and carried to be moved are factors also.  Navigator will sign off at this time.  Charmaine Pines, RN, BSN [facility] Heart Failure Navigator Secure Chat Only

## 2023-12-15 NOTE — Plan of Care (Signed)
°  Problem: Education: Goal: Ability to demonstrate management of disease process will improve Outcome: Progressing Goal: Ability to verbalize understanding of medication therapies will improve Outcome: Progressing Goal: Individualized Educational Video(s) Outcome: Progressing   Problem: Activity: Goal: Capacity to carry out activities will improve Outcome: Progressing   Problem: Cardiac: Goal: Ability to achieve and maintain adequate cardiopulmonary perfusion will improve Outcome: Progressing   Problem: Education: Goal: Knowledge of disease or condition will improve Outcome: Progressing Goal: Knowledge of the prescribed therapeutic regimen will improve Outcome: Progressing Goal: Individualized Educational Video(s) Outcome: Progressing   Problem: Activity: Goal: Ability to tolerate increased activity will improve Outcome: Progressing Goal: Will verbalize the importance of balancing activity with adequate rest periods Outcome: Progressing   Problem: Respiratory: Goal: Ability to maintain a clear airway will improve Outcome: Progressing Goal: Levels of oxygenation will improve Outcome: Progressing Goal: Ability to maintain adequate ventilation will improve Outcome: Progressing   Problem: Activity: Goal: Ability to tolerate increased activity will improve Outcome: Progressing   Problem: Clinical Measurements: Goal: Ability to maintain a body temperature in the normal range will improve Outcome: Progressing   Problem: Respiratory: Goal: Ability to maintain adequate ventilation will improve Outcome: Progressing Goal: Ability to maintain a clear airway will improve Outcome: Progressing   Problem: Education: Goal: Knowledge of General Education information will improve Description: Including pain rating scale, medication(s)/side effects and non-pharmacologic comfort measures Outcome: Progressing   Problem: Health Behavior/Discharge Planning: Goal: Ability to manage  health-related needs will improve Outcome: Progressing   Problem: Clinical Measurements: Goal: Ability to maintain clinical measurements within normal limits will improve Outcome: Progressing Goal: Will remain free from infection Outcome: Progressing Goal: Diagnostic test results will improve Outcome: Progressing Goal: Respiratory complications will improve Outcome: Progressing Goal: Cardiovascular complication will be avoided Outcome: Progressing   Problem: Activity: Goal: Risk for activity intolerance will decrease Outcome: Progressing   Problem: Nutrition: Goal: Adequate nutrition will be maintained Outcome: Progressing   Problem: Coping: Goal: Level of anxiety will decrease Outcome: Progressing   Problem: Elimination: Goal: Will not experience complications related to bowel motility Outcome: Progressing Goal: Will not experience complications related to urinary retention Outcome: Progressing   Problem: Pain Managment: Goal: General experience of comfort will improve and/or be controlled Outcome: Progressing   Problem: Safety: Goal: Ability to remain free from injury will improve Outcome: Progressing   Problem: Skin Integrity: Goal: Risk for impaired skin integrity will decrease Outcome: Progressing

## 2023-12-16 DIAGNOSIS — R531 Weakness: Secondary | ICD-10-CM

## 2023-12-16 DIAGNOSIS — N390 Urinary tract infection, site not specified: Secondary | ICD-10-CM

## 2023-12-16 DIAGNOSIS — Z515 Encounter for palliative care: Secondary | ICD-10-CM

## 2023-12-16 DIAGNOSIS — Z7189 Other specified counseling: Secondary | ICD-10-CM

## 2023-12-16 DIAGNOSIS — F0390 Unspecified dementia without behavioral disturbance: Secondary | ICD-10-CM

## 2023-12-16 LAB — CBC
HCT: 33.4 % — ABNORMAL LOW (ref 36.0–46.0)
Hemoglobin: 10.8 g/dL — ABNORMAL LOW (ref 12.0–15.0)
MCH: 28.1 pg (ref 26.0–34.0)
MCHC: 32.3 g/dL (ref 30.0–36.0)
MCV: 86.8 fL (ref 80.0–100.0)
Platelets: 251 K/uL (ref 150–400)
RBC: 3.85 MIL/uL — ABNORMAL LOW (ref 3.87–5.11)
RDW: 12.9 % (ref 11.5–15.5)
WBC: 9.2 K/uL (ref 4.0–10.5)
nRBC: 0 % (ref 0.0–0.2)

## 2023-12-16 LAB — BASIC METABOLIC PANEL WITH GFR
Anion gap: 12 (ref 5–15)
BUN: 37 mg/dL — ABNORMAL HIGH (ref 8–23)
CO2: 25 mmol/L (ref 22–32)
Calcium: 8.8 mg/dL — ABNORMAL LOW (ref 8.9–10.3)
Chloride: 97 mmol/L — ABNORMAL LOW (ref 98–111)
Creatinine, Ser: 1.29 mg/dL — ABNORMAL HIGH (ref 0.44–1.00)
GFR, Estimated: 40 mL/min — ABNORMAL LOW (ref >60)
Glucose, Bld: 120 mg/dL — ABNORMAL HIGH (ref 70–99)
Potassium: 4.6 mmol/L (ref 3.5–5.1)
Sodium: 135 mmol/L (ref 135–145)

## 2023-12-16 MED ORDER — MIDODRINE HCL 5 MG PO TABS
5.0000 mg | ORAL_TABLET | Freq: Three times a day (TID) | ORAL | Status: DC
  Administered 2023-12-16 (×2): 5 mg via ORAL
  Filled 2023-12-16 (×2): qty 1

## 2023-12-16 MED ORDER — ENSURE PLUS HIGH PROTEIN PO LIQD
237.0000 mL | Freq: Two times a day (BID) | ORAL | Status: DC
  Administered 2023-12-16 – 2023-12-18 (×5): 237 mL via ORAL

## 2023-12-16 MED ORDER — POLYETHYLENE GLYCOL 3350 17 G PO PACK
17.0000 g | PACK | Freq: Every day | ORAL | Status: DC
  Administered 2023-12-16 – 2023-12-17 (×2): 17 g via ORAL
  Filled 2023-12-16 (×3): qty 1

## 2023-12-16 MED ORDER — OXYCODONE HCL 5 MG PO TABS
5.0000 mg | ORAL_TABLET | Freq: Every evening | ORAL | Status: DC | PRN
  Administered 2023-12-16: 5 mg via ORAL
  Filled 2023-12-16: qty 1

## 2023-12-16 MED ORDER — NICOTINE 14 MG/24HR TD PT24
14.0000 mg | MEDICATED_PATCH | Freq: Every day | TRANSDERMAL | Status: DC
  Administered 2023-12-16 – 2023-12-18 (×3): 14 mg via TRANSDERMAL
  Filled 2023-12-16 (×3): qty 1

## 2023-12-16 MED ORDER — TRAMADOL HCL 50 MG PO TABS
50.0000 mg | ORAL_TABLET | Freq: Four times a day (QID) | ORAL | Status: DC | PRN
  Administered 2023-12-16: 50 mg via ORAL
  Filled 2023-12-16: qty 1

## 2023-12-16 NOTE — Progress Notes (Signed)
 PROGRESS NOTE   HPI was taken from Dr. Hilma:  MARAE COTTRELL is a 89 y.o. female with medical history significant of dCHF,  HLD, COPD, pulmonary fibrosis, dementia, CKD-3B, anxiety, anemia, chronic pain, IBS, chronic hypotension, under hospice, who presents with weakness.     Patient has hx of dementia, and is unable to provide accurate medical history, therefore, most of the history is obtained by discussing the case with ED physician, per EMS report, and with the nursing staff.   Per report, pt has overall health is declining recently, not feeling well with generalized weakness. She is noted to be much weaker in the past 2 days.  She is no longer able to feed herself. She needs to be picked up and carried to be moved. Per EDP, when hospice nurse came today, pt is noted to have low blood pressure.  The family's concern for an infection, sepsis, or stroke.  Patient does not have any acute pain.  When I saw pt in ED, pt seems to know her own name, not orientated to place and time.  She moves all extremities.  No facial droop noted.  She seems to have difficulty breathing, no active cough.  No active nausea vomiting, diarrhea noted.  Not sure if patient has symptoms of UTI. Pt has a chronic mass in left neck.   Initially patient had low blood pressure 78/56 which improved to SBP 110s after giving 1 L LR in ED.   Data reviewed independently and ED Course: pt was found to have BNP 6019, positive UA (cloudy appearance, moderate amount leukocyte, many bacteria, WBC> 50, squamous cell 11-20), troponin 34 --> 29, lactic acid 0.9, WBC 19.4, worsening renal function, negative PCR for COVID, flu and RSV.  Temperature 99.4, heart rate 80-90s, RR 21, oxygen saturation 99% on 2 L oxygen (not sure how much oxygen patient is using at home). Patient is admitted to PCU as inpatient.   Chest x-ray: Lower inspiratory volumes with significantly increased diffuse interstitial and airspace opacities bilaterally.  Differential considerations include CHF versus atypical or viral pneumonia.   CT of head:  No acute intracranial abnormality. 2. Extensive chronic small vessel ischemic disease. 3. Chronic right mastoid and middle ear effusions.     NATALYN SZYMANOWSKI  FMW:[id number] DOB: 1934/01/24 DOA: 12/13/2023 PCP: [name], MD   Assessment & Plan:   Principal Problem:   Acute on chronic diastolic CHF (congestive heart failure) (HCC) Active Problems:   Myocardial injury   Leukocytosis   COPD (chronic obstructive pulmonary disease) (HCC)   Pulmonary fibrosis (HCC)   UTI (urinary tract infection)   Hypotension   Chronic kidney disease, stage 3b (HCC)   Hyperlipidemia   Chronic pain   Dementia without behavioral disturbance (HCC)   Depression with anxiety  Assessment and Plan:  Acute on chronic diastolic CHF: has trace leg edema, but has significantly elevated proBNP 6019, positive JVD. Continue on IV lasix , kidney function tolerates this. Monitor I/Os. Fluid restriction    Myocardial injury: Trop  34 --> 29.  Likely demand ischemia.  Stroke concern: family tells me there was an abrupt change in patient's functional status, trouble swallowing, facial droop, that accompanied her initial presentation symptoms. Non-focal exam here and mri brain nothing acute.  Possible CAP: no significant respiratory symptms. Possible ILD at recent pulm visit. Wbcs improving on cefepime . Legionella is pending. Blood cxs NGTD. Rvp neg. Stable off oxygen. Can probably de-escalate abx tomorrow when urine culture returns. Recent admission so  covering with broad-spectrum abx for now  Possible UTI: urine cx growint 100k e coli. Wbc has normalized.  Continue on IV abxs pending sensitivities   Hx of pulmonary fibrosis: continue w/ supportive care. Added oral prednisone   Hx of COPD: bronchodilators prn. Encourage incentive spirometry   Hypotension: chronic. Improved. D/c midodrine  today   CKDIIIb: stable    HLD: continue on statin    Chronic pain: home tramadol /oxy prn    Dementia: continue on home dose of namenda , donepezil     Anxiety : severity unknown. Continue on home dose of xanax   Mass in left neck:  approximately 6-7 cm in size. Chronic issue. F/u with PCP as outpt  Hospice patient: family initially interested in snf but now has opted to return home.       DVT prophylaxis: lovenox  Code Status: DNR Family Communication: daughter  at bedside 12/12 Disposition Plan: home to continue w/ hospice  Level of care: Progressive  Status is: Inpatient Remains inpatient appropriate because: on iv abx    Subjective:  No complaints, family says still some confusion. Has appetite. BM today.  Objective: Vitals:   12/16/23 0427 12/16/23 0500 12/16/23 0745 12/16/23 1038  BP: (!) 148/98  (!) 144/94 113/65  Pulse: 64  61 74  Resp: 20  19 17   Temp: (!) 96.7 F (35.9 C)  (!) 97.5 F (36.4 C) (!) 97.5 F (36.4 C)  TempSrc:      SpO2: 95%  99% 98%  Weight:  52 kg    Height:        Intake/Output Summary (Last 24 hours) at 12/16/2023 1320 Last data filed at 12/16/2023 9047 Gross per 24 hour  Intake 60 ml  Output 1100 ml  Net -1040 ml   Filed Weights   12/14/23 0500 12/15/23 0410 12/16/23 0500  Weight: 52.2 kg 52.2 kg 52 kg    Examination:  General exam: appears chronically ill Respiratory system: decreased breath sounds b/l, scattered rales. Normal wob Cardiovascular system: S1 & S2+.  Gastrointestinal system: Abdomen is nondistended, soft and nontender.  Central nervous system: Alert and awake,. Moves all 4, decreased strength throughout Psychiatry: calm, pleasantly confused    Data Reviewed: I have personally reviewed following labs and imaging studies  CBC: Recent Labs  Lab 12/13/23 1433 12/14/23 0508 12/15/23 0433 12/16/23 0412  WBC 19.4* 12.0* 13.2* 9.2  HGB 10.4* 9.7* 9.9* 10.8*  HCT 32.1* 29.3* 30.2* 33.4*  MCV 87.0 86.4 87.3 86.8  PLT 218 197 217  251   Basic Metabolic Panel: Recent Labs  Lab 12/13/23 1433 12/13/23 1615 12/14/23 0508 12/15/23 0433 12/16/23 0412  NA 130*  --  131* 130* 135  K 4.8  --  4.5 4.1 4.6  CL 93*  --  95* 93* 97*  CO2 21*  --  23 24 25   GLUCOSE 81  --  93 103* 120*  BUN 34*  --  33* 35* 37*  CREATININE 1.49*  --  1.39* 1.36* 1.29*  CALCIUM  8.6*  --  8.4* 8.4* 8.8*  MG  --  2.1  --   --   --    GFR: Estimated Creatinine Clearance: 23.2 mL/min (A) (by C-G formula based on SCr of 1.29 mg/dL (H)). Liver Function Tests: No results for input(s): AST, ALT, ALKPHOS, BILITOT, PROT, ALBUMIN  in the last 168 hours. No results for input(s): LIPASE, AMYLASE in the last 168 hours. No results for input(s): AMMONIA in the last 168 hours. Coagulation Profile: No results for input(s): INR, PROTIME  in the last 168 hours. Cardiac Enzymes: No results for input(s): CKTOTAL, CKMB, CKMBINDEX, TROPONINI in the last 168 hours. BNP (last 3 results) Recent Labs    10/20/23 1554 11/10/23 2127 12/13/23 1616  PROBNP 457.0* 476.0* 6,019.0*   HbA1C: No results for input(s): HGBA1C in the last 72 hours. CBG: No results for input(s): GLUCAP in the last 168 hours. Lipid Profile: No results for input(s): CHOL, HDL, LDLCALC, TRIG, CHOLHDL, LDLDIRECT in the last 72 hours. Thyroid  Function Tests: No results for input(s): TSH, T4TOTAL, FREET4, T3FREE, THYROIDAB in the last 72 hours. Anemia Panel: No results for input(s): VITAMINB12, FOLATE, FERRITIN, TIBC, IRON, RETICCTPCT in the last 72 hours. Sepsis Labs: Recent Labs  Lab 12/13/23 1615  LATICACIDVEN 0.9    Recent Results (from the past 240 hours)  Resp panel by RT-PCR (RSV, Flu A&B, Covid) Anterior Nasal Swab     Status: None   Collection Time: 12/13/23 12:32 AM   Specimen: Anterior Nasal Swab  Result Value Ref Range Status   SARS Coronavirus 2 by RT PCR NEGATIVE NEGATIVE Final    Comment:  (NOTE) SARS-CoV-2 target nucleic acids are NOT DETECTED.  The SARS-CoV-2 RNA is generally detectable in upper respiratory specimens during the acute phase of infection. The lowest concentration of SARS-CoV-2 viral copies this assay can detect is 138 copies/mL. A negative result does not preclude SARS-Cov-2 infection and should not be used as the sole basis for treatment or other patient management decisions. A negative result may occur with  improper specimen collection/handling, submission of specimen other than nasopharyngeal swab, presence of viral mutation(s) within the areas targeted by this assay, and inadequate number of viral copies(<138 copies/mL). A negative result must be combined with clinical observations, patient history, and epidemiological information. The expected result is Negative.  Fact Sheet for Patients:  bloggercourse.com  Fact Sheet for Healthcare Providers:  seriousbroker.it  This test is no t yet approved or cleared by the [place]  FDA and  has been authorized for detection and/or diagnosis of SARS-CoV-2 by FDA under an Emergency Use Authorization (EUA). This EUA will remain  in effect (meaning this test can be used) for the duration of the COVID-19 declaration under Section 564(b)(1) of the Act, 21 U.S.C.section 360bbb-3(b)(1), unless the authorization is terminated  or revoked sooner.       Influenza A by PCR NEGATIVE NEGATIVE Final   Influenza B by PCR NEGATIVE NEGATIVE Final    Comment: (NOTE) The Xpert Xpress SARS-CoV-2/FLU/RSV plus assay is intended as an aid in the diagnosis of influenza from Nasopharyngeal swab specimens and should not be used as a sole basis for treatment. Nasal washings and aspirates are unacceptable for Xpert Xpress SARS-CoV-2/FLU/RSV testing.  Fact Sheet for Patients: bloggercourse.com  Fact Sheet for Healthcare  Providers: seriousbroker.it  This test is not yet approved or cleared by the [place]  FDA and has been authorized for detection and/or diagnosis of SARS-CoV-2 by FDA under an Emergency Use Authorization (EUA). This EUA will remain in effect (meaning this test can be used) for the duration of the COVID-19 declaration under Section 564(b)(1) of the Act, 21 U.S.C. section 360bbb-3(b)(1), unless the authorization is terminated or revoked.     Resp Syncytial Virus by PCR NEGATIVE NEGATIVE Final    Comment: (NOTE) Fact Sheet for Patients: bloggercourse.com  Fact Sheet for Healthcare Providers: seriousbroker.it  This test is not yet approved or cleared by the [place]  FDA and has been authorized for detection and/or diagnosis of SARS-CoV-2 by FDA under  an Emergency Use Authorization (EUA). This EUA will remain in effect (meaning this test can be used) for the duration of the COVID-19 declaration under Section 564(b)(1) of the Act, 21 U.S.C. section 360bbb-3(b)(1), unless the authorization is terminated or revoked.  Performed at [facility], [address] [place], [place] [address]   Blood Culture (routine x 2)     Status: None (Preliminary result)   Collection Time: 12/13/23  4:16 PM   Specimen: BLOOD  Result Value Ref Range Status   Specimen Description BLOOD LEFT ANTECUBITAL  Final   Special Requests   Final    BOTTLES DRAWN AEROBIC AND ANAEROBIC Blood Culture adequate volume   Culture   Final    NO GROWTH 3 DAYS Performed at [facility], [address], [place], [place] [address]    Report Status PENDING  Incomplete  Blood Culture (routine x 2)     Status: None (Preliminary result)   Collection Time: 12/13/23  4:16 PM   Specimen: BLOOD  Result Value Ref Range Status   Specimen Description BLOOD RIGHT ANTECUBITAL  Final   Special Requests   Final    BOTTLES DRAWN  AEROBIC AND ANAEROBIC Blood Culture adequate volume   Culture   Final    NO GROWTH 3 DAYS Performed at [facility], [address], [place], [place] [address]    Report Status PENDING  Incomplete  Urine Culture (for pregnant, neutropenic or urologic patients or patients with an indwelling urinary catheter)     Status: Abnormal (Preliminary result)   Collection Time: 12/13/23  5:10 PM   Specimen: Urine, Clean Catch  Result Value Ref Range Status   Specimen Description   Final    URINE, CLEAN CATCH Performed at [facility], [address], [place], [place] [address]    Special Requests   Final    NONE Performed at [facility], [address] [place], [place] [address]    Culture (A)  Final    >=100,000 COLONIES/mL ESCHERICHIA COLI SUSCEPTIBILITIES TO FOLLOW Performed at [facility], [address] [place], [place] [address]    Report Status PENDING  Incomplete  MRSA Next Gen by PCR, Nasal     Status: Abnormal   Collection Time: 12/14/23  7:20 AM   Specimen: Nasal Mucosa; Nasal Swab  Result Value Ref Range Status   MRSA by PCR Next Gen DETECTED (A) NOT DETECTED Final    Comment: RESULT CALLED TO, READ BACK BY AND VERIFIED WITH: ALEX JONES 12/14/23 1005 MW (NOTE) The GeneXpert MRSA Assay (FDA approved for NASAL specimens only), is one component of a comprehensive MRSA colonization surveillance program. It is not intended to diagnose MRSA infection nor to guide or monitor treatment for MRSA infections. Test performance is not FDA approved in patients less than 65 years old. Performed at [facility], [address] [place], [place] [address]   Respiratory (~20 pathogens) panel by PCR     Status: None   Collection Time: 12/15/23  2:33 PM   Specimen: Nasopharyngeal Swab; Respiratory  Result Value Ref Range Status   Adenovirus NOT DETECTED NOT DETECTED Final   Coronavirus 229E NOT DETECTED NOT DETECTED Final    Comment:  (NOTE) The Coronavirus on the Respiratory Panel, DOES NOT test for the novel  Coronavirus (2019 nCoV)    Coronavirus HKU1 NOT DETECTED NOT DETECTED Final   Coronavirus NL63 NOT DETECTED NOT DETECTED Final   Coronavirus OC43 NOT DETECTED NOT DETECTED  Final   Metapneumovirus NOT DETECTED NOT DETECTED Final   Rhinovirus / Enterovirus NOT DETECTED NOT DETECTED Final   Influenza A NOT DETECTED NOT DETECTED Final   Influenza B NOT DETECTED NOT DETECTED Final   Parainfluenza Virus 1 NOT DETECTED NOT DETECTED Final   Parainfluenza Virus 2 NOT DETECTED NOT DETECTED Final   Parainfluenza Virus 3 NOT DETECTED NOT DETECTED Final   Parainfluenza Virus 4 NOT DETECTED NOT DETECTED Final   Respiratory Syncytial Virus NOT DETECTED NOT DETECTED Final   Bordetella pertussis NOT DETECTED NOT DETECTED Final   Bordetella Parapertussis NOT DETECTED NOT DETECTED Final   Chlamydophila pneumoniae NOT DETECTED NOT DETECTED Final   Mycoplasma pneumoniae NOT DETECTED NOT DETECTED Final    Comment: Performed at [facility], [address] [place], [place] [address]         Radiology Studies: MR BRAIN WO CONTRAST Result Date: 12/16/2023 EXAM: MRI BRAIN WITHOUT CONTRAST 12/15/2023 10:00:00 PM TECHNIQUE: Multiplanar multisequence MRI of the head/brain was performed without the administration of intravenous contrast. COMPARISON: CT head 12/13/2023, earlier CT head studies, and cervical spine CT 05/29/2023. CLINICAL HISTORY: 89 year old female with abrupt change in functional status. FINDINGS: BRAIN AND VENTRICLES: No acute infarct. No intracranial hemorrhage. No mass. No midline shift. No hydrocephalus. The sella is unremarkable. Normal flow voids. Portuosity of the major vascular flow voids. Distal left vertebral artery appears to be dominant, normal variant. Stable brain volume. Patchy and confluent bilateral cerebral white matter T2 and FLAIR hyperintensity with extension into the deep white matter capsules.  No cortical encephalomalacia or chronic cerebral blood products identified. Comparatively mild T2 heterogeneity in the deep gray nuclei and brainstem. Negative cerebellum. ORBITS: No acute abnormality. SINUSES AND MASTOIDS: Small volume retained secretions in the nasopharynx on the right. Chronically opacified, partially sclerotic right middle ear and mastoids. Paranasal sinuses, left mastoids are well aerated. BONES AND SOFT TISSUES: Susceptibility artifact related to a large left posterolateral neck suboccipital object CT of the left upper neck of unclear etiology and significance (series 11 image 1). Normal marrow signal. Partially visible cervical spine degeneration including chronic spondylolisthesis. Chronic degenerative cervical spinal ankylosis noted on the comparison. IMPRESSION: 1. No acute intracranial abnormality. 2. Moderate for age cerebral white matter disease compatible with chronic small vessel disease. 3. Large chronic object of unknown etiology and significance in the left suboccipital neck soft tissues. 4. 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 Electronically signed by: Helayne Hurst MD 12/16/2023 03:57 AM EST RP Workstation: [id number]        Scheduled Meds:  ALPRAZolam   0.5 mg Oral BID   donepezil   10 mg Oral QHS   enoxaparin  (LOVENOX ) injection  30 mg Subcutaneous QHS   feeding supplement  237 mL Oral BID BM   fluticasone  furoate-vilanterol  1 puff Inhalation Daily   furosemide   40 mg Intravenous Daily   loratadine   10 mg Oral Daily   memantine   10 mg Oral BID   multivitamin with minerals   Oral Daily   mupirocin  ointment  1 Application Nasal BID   nicotine   14 mg Transdermal Daily   pantoprazole   40 mg Oral BID   predniSONE   40 mg Oral Q breakfast   simvastatin   10 mg Oral QHS   Continuous Infusions:  ceFEPime  (MAXIPIME ) IV 2 g (12/15/23 1459)     LOS: 3 days      Devaughn KATHEE Ban, MD [facility]  If 7PM-7AM, please contact  night-coverage [email or web address] 12/16/2023, 1:20 PM

## 2023-12-16 NOTE — Consult Note (Signed)
 Consultation Note Date: 12/16/2023   Patient Name: [name]  DOB: [birth year]  MRN: [id number]  Age / Sex: 89 y.o., female  PCP: [name], MD Referring Physician: Kandis Devaughn Sayres, MD  Reason for Consultation: Establishing goals of care   HPI/Brief Hospital Course: 89 y.o. female  with past medical history of CHF, hyperlipidemia, COPD, pulmonary fibrosis, dementia, CKD stage IIIb, chronic pain, IBS and chronic hypotension on midodrine  admitted from home on 12/13/2023 with generalized weakness.  Reportedly from daughter and a 12-hour timeframe Ms. Trimarco went from being able to ambulate with a walker and function with minimal assistance to needing complete assistance. Noted to have a chronic mass on left neck.  Palliative medicine was consulted for assisting with goals of care conversations.   Subjective:  Extensive chart review has been completed prior to meeting patient including labs, vital signs, imaging, progress notes, orders, and available advanced directive documents from current and previous encounters.  Visited with Ms. Tebbetts at her bedside.  She is awake and alert in bed and able to engage in conversation.  She is oriented to herself only at this time.  Daughter and granddaughter at bedside during time of visit.  Introduced myself as a publishing rights manager as a member of the palliative care team. Explained palliative medicine is specialized medical care for people living with serious illness. It focuses on providing relief from the symptoms and stress of a serious illness. The goal is to improve quality of life for both the patient and the family.   Daughter inquires about results from MRI as well as nasal swab.  Reviewed results of MRI as well as respiratory panel with daughter.  MRI negative for acute intracranial process.  Nasal swab negative for respiratory panel.  Daughter emphasizes not understanding the cause of Ms. Amspacher's  significant functional decline in a short amount of time.  We reviewed results of urine culture-positive for E. coli.  We discussed and advanced age with underlying dementia acute illness such as UTI can have profound effect on cognitive and functional status.  Daughter shares Ms. Leppanen lives in the home with her and her children.  At baseline Ms. Oldfield is able to ambulate short distances with a walker.  Daughter assist Ms. Eccleston with bathing, dressing and toileting--minimal assistance required.  At baseline they share Ms. Elzey is able to feed herself.  On day of admission, daughter reports Ms. Landstrom had profound weakness, unable to stand up from bed and unable to ambulate.  Daughter noticed weakness greater on 1 side and concern for CVA.  Daughter also expresses concern related to Ms. Salado is decline in cognitive function.  Discussed with Ms. Smotherman delirium that can be caused from acute illness including UTI.  Discussed hopeful return of cognitive function but chance of continued decline in cognitive function related to acute illness with underlying dementia.  Daughter verbalizes understanding.  Daughter confirms Ms. Duquette has been under the care of [facility] hospice for about 2 to 3 weeks.  Daughter shares Ms. Desroches's PCP Dr. Fernande made initial referral to hospice.  At time of referral daughter felt it was appropriate for Ms. Vogelgesang to be treated and placed within her home under the care of Dr. Fernande and the hospice physician.  She speaks to the challenge it has been lately and getting Ms. Ingman to her outside doctors appointments.  We briefly discussed the overall philosophy of hospice.  Daughter verbalizes understanding and remains in agreement with hospice care.  Daughter explains the sudden change in cognitive and functional status prompted her to seek emergency care.  Daughter shares at discharge plan is for Ms. Herbig to return home living with her and wishes for [facility] hospice to continue  following.  During hospital course she wishes to continue to treat the treatable.  I discussed importance of continued conversations with family/support persons and all members of their medical team regarding overall plan of care and treatment options ensuring decisions are in alignment with patients goals of care.  All questions/concerns addressed. Emotional support provided to patient/family/support persons. PMT will continue to follow and support patient as needed.  Objective: Primary Diagnoses: Present on Admission:  Acute on chronic diastolic CHF (congestive heart failure) (HCC)  UTI (urinary tract infection)  Hyperlipidemia  COPD (chronic obstructive pulmonary disease) (HCC)  Leukocytosis  Chronic pain  Hypotension  Pulmonary fibrosis (HCC)  Depression with anxiety  Dementia without behavioral disturbance (HCC)  Myocardial injury  Chronic kidney disease, stage 3b (HCC)   Physical Exam Constitutional:      General: She is not in acute distress.    Appearance: She is ill-appearing.  HENT:     Head: Normocephalic.     Mouth/Throat:     Mouth: Mucous membranes are dry.  Pulmonary:     Effort: Pulmonary effort is normal. No respiratory distress.  Abdominal:     Palpations: Abdomen is soft.  Skin:    General: Skin is warm and dry.  Neurological:     Mental Status: She is alert.     Motor: Weakness present.     Comments: Oriented to self  Psychiatric:        Behavior: Behavior normal.     Vital Signs: BP 113/65   Pulse 74   Temp (!) 97.5 F (36.4 C)   Resp 17   Ht 5' 1 (1.549 m)   Wt 52 kg   SpO2 98%   BMI 21.66 kg/m  Pain Scale: 0-10 POSS *See Group Information*: 1-Acceptable,Awake and alert Pain Score: 9   IO: Intake/output summary:  Intake/Output Summary (Last 24 hours) at 12/16/2023 1500 Last data filed at 12/16/2023 9047 Gross per 24 hour  Intake 60 ml  Output 1100 ml  Net -1040 ml    LBM: Last BM Date : 12/14/23 Baseline Weight: Weight: 49.9  kg Most recent weight: Weight: 52 kg       Palliative Assessment/Data: 50%   Assessment and Plan  SUMMARY OF RECOMMENDATIONS   Continue current plan of care Plan to discharge home back with daughter with hospice services continuing to follow  Palliative Prophylaxis:   Bowel Regimen, Delirium Protocol and Frequent Pain Assessment   Discussed With: Primary team and [facility] hospice liaison   Thank you for this consult and allowing Palliative Medicine to participate in the care of Nickcole L. Gesell. Palliative medicine will continue to follow and assist as needed.   Time spent includes: Detailed review of medical records (labs, imaging, vital signs), medically appropriate exam (mental status, respiratory, cardiac, skin), discussed with treatment team, counseling and educating patient, family and staff, documenting clinical information, medication management and coordination of care.   Signed by: Waddell Lesches, DNP, AGNP-C Palliative Medicine    Please contact Palliative Medicine Team phone at [phone] for questions and concerns.  For individual provider: See Tracey

## 2023-12-16 NOTE — Progress Notes (Signed)
 [facility] rm 240 [facility] patient visit   [name] is a current [facility] patient with a terminal diagnosis of cerebrovascular disease. Prior to 2 days ago patient was able to ambulate independently with 2 wheeled walker however within the last 2 days she had declined to the point of not even being able to hold cup or feed self. Family made decision for further evaluation in ED due to this rapid decline. Patient was admitted with diagnosis of acute on chronic CHF. Per Dr. Eric Showman this is a hospice related admission. Patient is a DNR.    Visited patient at bedside. Daughter and granddaughter present.  Patient is awake and alert with confusion noted.  Patient repeatedly states  I want to go home.  Patients daughter expresses concern with patient general decline since being admitted.  Support offered.  Education provided on hospice services continuing.  She has received IV fluids as well as IV antibiotics which will continue and is GIP appropriate for this reason.   Vital Signs- 113/65 HR 74 RR 17 Spo2 98% RA T 97.5   I&O-  60/not recorded   Abnormal Labs- RBC 3.85 Hemoglobin 10.8 HCT 33.4 Chloride 97 BUN 37 Creatinine 1.29 calcium  8.8 GFR 40   Diagnostics-   MRI Brain WO Contrast    IMPRESSION: 1. No acute intracranial abnormality. 2. Moderate for age cerebral white matter disease compatible with chronic small vessel disease. 3. Large chronic object of unknown etiology and significance in the left suboccipital neck soft tissues. 4. 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     IV/PRN Meds- Lasix  40 mg IV x 3, Maxipime  2mg  IV q24hrs   Current plan as per Dr. Devaughn Rozella Ban, MD 12.12.25 -  Principal Problem:   Acute on chronic diastolic CHF (congestive heart failure) (HCC) Active Problems:   Myocardial injury   Leukocytosis   COPD (chronic obstructive pulmonary disease) (HCC)   Pulmonary fibrosis (HCC)   UTI  (urinary tract infection)   Hypotension   Chronic kidney disease, stage 3b (HCC)   Hyperlipidemia   Chronic pain   Dementia without behavioral disturbance (HCC)   Depression with anxiety   Assessment and Plan:   Acute on chronic diastolic CHF: has trace leg edema, but has significantly elevated proBNP 6019, positive JVD. Continue on IV lasix , kidney function tolerates this. Monitor I/Os. Fluid restriction    Myocardial injury: Trop  34 --> 29.  Likely demand ischemia.   Stroke concern: family tells me there was an abrupt change in patient's functional status, trouble swallowing, facial droop, that accompanied her initial presentation symptoms. Non-focal exam here and mri brain nothing acute.   Possible CAP: no significant respiratory symptms. Possible ILD at recent pulm visit. Wbcs improving on cefepime . Legionella is pending. Blood cxs NGTD. Rvp neg. Stable off oxygen. Can probably de-escalate abx tomorrow when urine culture returns. Recent admission so covering with broad-spectrum abx for now   Possible UTI: urine cx growint 100k e coli. Wbc has normalized.  Continue on IV abxs pending sensitivities   Hx of pulmonary fibrosis: continue w/ supportive care. Added oral prednisone    Hx of COPD: bronchodilators prn. Encourage incentive spirometry    Hypotension: chronic. Improved. D/c midodrine  today   CKDIIIb: stable   HLD: continue on statin    Chronic pain: home tramadol /oxy prn    Dementia: continue on home dose of namenda , donepezil     Anxiety : severity unknown. Continue on home dose of xanax    Mass  in left neck:  approximately 6-7 cm in size. Chronic issue. F/u with PCP as outpt   Hospice patient: family initially interested in snf but now has opted to return home.     DVT prophylaxis: lovenox  Code Status: DNR Family Communication: daughter  at bedside 12/12 Disposition Plan: home to continue w/ hospice   Level of care: Progressive   Status is: Inpatient Remains  inpatient appropriate because: on iv abx    Discharge Planning- Ongoing   Family Contact-Daughter [name] IDT: updated Goals of Care: DNR Transfer summary and Med list sent to be placed under Media  Thank you for the opportunity to participate in this patient's care, please don't hesitate to call for any hospice related questions or concerns.    Daphne Shed, [facility] Liaison  [phone]

## 2023-12-16 NOTE — Plan of Care (Signed)
°  Problem: Education: Goal: Ability to demonstrate management of disease process will improve Outcome: Progressing Goal: Ability to verbalize understanding of medication therapies will improve Outcome: Progressing Goal: Individualized Educational Video(s) Outcome: Progressing   Problem: Activity: Goal: Capacity to carry out activities will improve Outcome: Progressing   Problem: Cardiac: Goal: Ability to achieve and maintain adequate cardiopulmonary perfusion will improve Outcome: Progressing   Problem: Education: Goal: Knowledge of disease or condition will improve Outcome: Progressing Goal: Knowledge of the prescribed therapeutic regimen will improve Outcome: Progressing Goal: Individualized Educational Video(s) Outcome: Progressing   Problem: Activity: Goal: Ability to tolerate increased activity will improve Outcome: Progressing Goal: Will verbalize the importance of balancing activity with adequate rest periods Outcome: Progressing   Problem: Respiratory: Goal: Ability to maintain a clear airway will improve Outcome: Progressing Goal: Levels of oxygenation will improve Outcome: Progressing Goal: Ability to maintain adequate ventilation will improve Outcome: Progressing   Problem: Activity: Goal: Ability to tolerate increased activity will improve Outcome: Progressing   Problem: Clinical Measurements: Goal: Ability to maintain a body temperature in the normal range will improve Outcome: Progressing   Problem: Respiratory: Goal: Ability to maintain adequate ventilation will improve Outcome: Progressing Goal: Ability to maintain a clear airway will improve Outcome: Progressing   Problem: Education: Goal: Knowledge of General Education information will improve Description: Including pain rating scale, medication(s)/side effects and non-pharmacologic comfort measures Outcome: Progressing   Problem: Health Behavior/Discharge Planning: Goal: Ability to manage  health-related needs will improve Outcome: Progressing   Problem: Clinical Measurements: Goal: Ability to maintain clinical measurements within normal limits will improve Outcome: Progressing Goal: Will remain free from infection Outcome: Progressing Goal: Diagnostic test results will improve Outcome: Progressing Goal: Respiratory complications will improve Outcome: Progressing Goal: Cardiovascular complication will be avoided Outcome: Progressing   Problem: Activity: Goal: Risk for activity intolerance will decrease Outcome: Progressing   Problem: Nutrition: Goal: Adequate nutrition will be maintained Outcome: Progressing   Problem: Coping: Goal: Level of anxiety will decrease Outcome: Progressing   Problem: Elimination: Goal: Will not experience complications related to bowel motility Outcome: Progressing Goal: Will not experience complications related to urinary retention Outcome: Progressing   Problem: Pain Managment: Goal: General experience of comfort will improve and/or be controlled Outcome: Progressing   Problem: Safety: Goal: Ability to remain free from injury will improve Outcome: Progressing   Problem: Skin Integrity: Goal: Risk for impaired skin integrity will decrease Outcome: Progressing

## 2023-12-17 DIAGNOSIS — D72829 Elevated white blood cell count, unspecified: Secondary | ICD-10-CM | POA: Diagnosis not present

## 2023-12-17 DIAGNOSIS — J449 Chronic obstructive pulmonary disease, unspecified: Secondary | ICD-10-CM | POA: Diagnosis not present

## 2023-12-17 DIAGNOSIS — I5A Non-ischemic myocardial injury (non-traumatic): Secondary | ICD-10-CM | POA: Diagnosis not present

## 2023-12-17 DIAGNOSIS — I5033 Acute on chronic diastolic (congestive) heart failure: Secondary | ICD-10-CM | POA: Diagnosis not present

## 2023-12-17 MED ORDER — PREDNISONE 20 MG PO TABS: 30.0000 mg | ORAL_TABLET | Freq: Every day | ORAL | Status: DC

## 2023-12-17 MED ORDER — IPRATROPIUM-ALBUTEROL 0.5-2.5 (3) MG/3ML IN SOLN
3.0000 mL | Freq: Four times a day (QID) | RESPIRATORY_TRACT | Status: DC
  Administered 2023-12-17 (×2): 3 mL via RESPIRATORY_TRACT
  Filled 2023-12-17 (×4): qty 3

## 2023-12-17 NOTE — Progress Notes (Signed)
 [facility] rm 240 [facility] Visit Note   [name] is a current [facility] patient with a terminal diagnosis of cerebrovascular disease. Prior to 2 days ago patient was able to ambulate independently with 2 wheeled walker however within the last 2 days she had declined to the point of not even being able to hold cup or feed self. Family made decision for further evaluation in ED due to this rapid decline. Patient was admitted with diagnosis of acute on chronic CHF. Per Dr. Eric Showman this is a hospice related admission. Patient is a DNR.   Visit to see Lacey. Mccloud today.  Patient sitting in the bed eating lunch.  Patient is able to feed herself with some assistance needed with her drink.  She is pleasantly confused.  Spoke with the patient's family to discuss hospice philosophy and answer any questions or concerns they may have as the medical team shared that it did not seem like they were understanding hospice.  After discussion, they do want to stay on hospice and want to bring Lacey. Lindeman back home.  [name] states the only thing she was wanting was to have PT come see the patient/evaluate her to see if she is able to stand and walk with the walker.  Patient was ambulatory by herself at home prior to this hospitalization.  The wanted to know her mobility status before discharging home. Information relayed to Dr. Maree and the rest of the medical team.  She has received IV fluids as well as IV antibiotics which will continue and is GIP appropriate for this reason   Vital Signs: T 97.9, BP 106/66, P 74, R 17  Oxi 95%  I&O: Intake Output Net -  Abnormal Labs:  None today  Diagnostics:  no new  IV/PRN Meds:   Lasix  40mg  IV Maxipime  2g daily  Hospital Plan:  per Vipul Maree Progress Note 12.13.25  Acute on chronic diastolic CHF: has trace leg edema, but has significantly elevated proBNP 6019, positive JVD. Continue on IV lasix , kidney function  tolerates this. Monitor I/Os. Fluid restriction    Myocardial injury: Trop  34 --> 29.  Likely demand ischemia.   Stroke concern: family tells me there was an abrupt change in patient's functional status, trouble swallowing, facial droop, that accompanied her initial presentation symptoms. Non-focal exam here and mri brain nothing acute.   Possible acute bronchitis: no significant respiratory symptms. Possible ILD at recent pulm visit. Wbcs improving on cefepime . Blood cxs NGTD. Rvp neg. Stable off oxygen. Can probably de-escalate abx at discharge. Recent admission so covering with broad-spectrum abx for now   E. coli UTI: urine cx growint 100k e coli. Wbc has normalized.  Continue IV Maxipime    Hx of pulmonary fibrosis: continue w/ supportive care.  Taper oral prednisone    Hx of COPD: bronchodilators prn. Encourage incentive spirometry    Hypotension: chronic. Improved. D/c midodrine     CKDIIIb: stable   HLD: continue on statin    Chronic pain: home tramadol /oxy prn    Dementia: continue on home dose of namenda , donepezil     Anxiety : severity unknown. Continue on home dose of xanax    Mass in left neck:  approximately 6-7 cm in size. Chronic issue. F/u with PCP as outpt   Hospice patient: family initially interested in snf but now has opted to return home with hospice.  ______________________  Discharge Plan:  home with hospice- likely tomorrow Goals of care- clear, DNR and wants  to remain on hospice IDT- updated Family contact:  Met at bedside with patient, [name], [name],  pt's son and patient's Granddaughter  Please do not hesitate to call with any hospice related questions or concerns.  Saddie HILARIO Na, RN Nurse Liaison [phone]

## 2023-12-17 NOTE — Progress Notes (Signed)
 Patient family in the room and has been very hateful to the staff. Complaining of everything that the staff is doing. Patients daughter is [name] and is not talking to the staff nicely.

## 2023-12-17 NOTE — Progress Notes (Signed)
 PROGRESS NOTE   HPI was taken from Dr. Hilma:  [name] is a 89 y.o. female with medical history significant of dCHF,  HLD, COPD, pulmonary fibrosis, dementia, CKD-3B, anxiety, anemia, chronic pain, IBS, chronic hypotension, under hospice, who presents with weakness.     Patient has hx of dementia, and is unable to provide accurate medical history, therefore, most of the history is obtained by discussing the case with ED physician, per EMS report, and with the nursing staff.   Per report, pt has overall health is declining recently, not feeling well with generalized weakness. She is noted to be much weaker in the past 2 days.  She is no longer able to feed herself. She needs to be picked up and carried to be moved. Per EDP, when hospice nurse came today, pt is noted to have low blood pressure.  The family's concern for an infection, sepsis, or stroke.  Patient does not have any acute pain.  When I saw pt in ED, pt seems to know her own name, not orientated to place and time.  She moves all extremities.  No facial droop noted.  She seems to have difficulty breathing, no active cough.  No active nausea vomiting, diarrhea noted.  Not sure if patient has symptoms of UTI. Pt has a chronic mass in left neck.   Initially patient had low blood pressure 78/56 which improved to SBP 110s after giving 1 L LR in ED.   Data reviewed independently and ED Course: pt was found to have BNP 6019, positive UA (cloudy appearance, moderate amount leukocyte, many bacteria, WBC> 50, squamous cell 11-20), troponin 34 --> 29, lactic acid 0.9, WBC 19.4, worsening renal function, negative PCR for COVID, flu and RSV.  Temperature 99.4, heart rate 80-90s, RR 21, oxygen saturation 99% on 2 L oxygen (not sure how much oxygen patient is using at home). Patient is admitted to PCU as inpatient.   Chest x-ray: Lower inspiratory volumes with significantly increased diffuse interstitial and airspace opacities bilaterally.  Differential considerations include CHF versus atypical or viral pneumonia.   CT of head:  No acute intracranial abnormality. 2. Extensive chronic small vessel ischemic disease. 3. Chronic right mastoid and middle ear effusions.   12/13: Daughter still not quite understanding hospice and requesting PT and OT evaluation while in the hospital   [name]  FMW:[id number] DOB: [birth year] DOA: 12/13/2023 PCP: [name], MD   Assessment & Plan:   Principal Problem:   Acute on chronic diastolic CHF (congestive heart failure) (HCC) Active Problems:   Myocardial injury   Leukocytosis   COPD (chronic obstructive pulmonary disease) (HCC)   Pulmonary fibrosis (HCC)   UTI (urinary tract infection)   Hypotension   Chronic kidney disease, stage 3b (HCC)   Hyperlipidemia   Chronic pain   Dementia without behavioral disturbance (HCC)   Depression with anxiety  Assessment and Plan:  Acute on chronic diastolic CHF: has trace leg edema, but has significantly elevated proBNP 6019, positive JVD. Continue on IV lasix , kidney function tolerates this. Monitor I/Os. Fluid restriction    Myocardial injury: Trop  34 --> 29.  Likely demand ischemia.  Stroke concern: family tells me there was an abrupt change in patient's functional status, trouble swallowing, facial droop, that accompanied her initial presentation symptoms. Non-focal exam here and mri brain nothing acute.  Possible acute bronchitis: no significant respiratory symptms. Possible ILD at recent pulm visit. Wbcs improving on cefepime . Blood cxs NGTD. Rvp neg.  Stable off oxygen. Can probably de-escalate abx at discharge. Recent admission so covering with broad-spectrum abx for now  E. coli UTI: urine cx growint 100k e coli. Wbc has normalized.  Continue IV Maxipime    Hx of pulmonary fibrosis: continue w/ supportive care.  Taper oral prednisone   Hx of COPD: bronchodilators prn. Encourage incentive spirometry   Hypotension:  chronic. Improved. D/c midodrine     CKDIIIb: stable   HLD: continue on statin    Chronic pain: home tramadol /oxy prn    Dementia: continue on home dose of namenda , donepezil     Anxiety : severity unknown. Continue on home dose of xanax   Mass in left neck:  approximately 6-7 cm in size. Chronic issue. F/u with PCP as outpt  Hospice patient: family initially interested in snf but now has opted to return home with hospice.       DVT prophylaxis: lovenox  Code Status: DNR Family Communication: daughter updated via phone Disposition Plan: home  w/ hospice hopefully tomorrow  Level of care: Med-Surg  Status is: Inpatient Remains inpatient appropriate because: on iv abx    Subjective:  No new issues.  Intermittent confusion, asking to go home  Objective: Vitals:   12/16/23 2145 12/17/23 0356 12/17/23 0500 12/17/23 0759  BP: 118/73 106/66  136/80  Pulse: 79 74  74  Resp: 20 17  16   Temp: 97.9 F (36.6 C) 97.9 F (36.6 C)  (!) 97 F (36.1 C)  TempSrc:      SpO2: 94% 95%  97%  Weight:   50.2 kg   Height:        Intake/Output Summary (Last 24 hours) at 12/17/2023 1323 Last data filed at 12/17/2023 1100 Gross per 24 hour  Intake 450 ml  Output 900 ml  Net -450 ml   Filed Weights   12/15/23 0410 12/16/23 0500 12/17/23 0500  Weight: 52.2 kg 52 kg 50.2 kg    Examination:  General exam: appears chronically ill Respiratory system: decreased breath sounds b/l, scattered rales. Normal wob Cardiovascular system: S1 & S2+.  Gastrointestinal system: Abdomen is nondistended, soft and nontender.  Central nervous system: Alert and awake,. Moves all 4, decreased strength throughout Psychiatry: calm, pleasantly confused    Data Reviewed: I have personally reviewed following labs and imaging studies  CBC: Recent Labs  Lab 12/13/23 1433 12/14/23 0508 12/15/23 0433 12/16/23 0412  WBC 19.4* 12.0* 13.2* 9.2  HGB 10.4* 9.7* 9.9* 10.8*  HCT 32.1* 29.3* 30.2* 33.4*   MCV 87.0 86.4 87.3 86.8  PLT 218 197 217 251   Basic Metabolic Panel: Recent Labs  Lab 12/13/23 1433 12/13/23 1615 12/14/23 0508 12/15/23 0433 12/16/23 0412  NA 130*  --  131* 130* 135  K 4.8  --  4.5 4.1 4.6  CL 93*  --  95* 93* 97*  CO2 21*  --  23 24 25   GLUCOSE 81  --  93 103* 120*  BUN 34*  --  33* 35* 37*  CREATININE 1.49*  --  1.39* 1.36* 1.29*  CALCIUM  8.6*  --  8.4* 8.4* 8.8*  MG  --  2.1  --   --   --     BNP (last 3 results) Recent Labs    10/20/23 1554 11/10/23 2127 12/13/23 1616  PROBNP 457.0* 476.0* 6,019.0*    Sepsis Labs: Recent Labs  Lab 12/13/23 1615  LATICACIDVEN 0.9    Recent Results (from the past 240 hours)  Resp panel by RT-PCR (RSV, Flu A&B, Covid) Anterior Nasal  Swab     Status: None   Collection Time: 12/13/23 12:32 AM   Specimen: Anterior Nasal Swab  Result Value Ref Range Status   SARS Coronavirus 2 by RT PCR NEGATIVE NEGATIVE Final    Comment: (NOTE) SARS-CoV-2 target nucleic acids are NOT DETECTED.  The SARS-CoV-2 RNA is generally detectable in upper respiratory specimens during the acute phase of infection. The lowest concentration of SARS-CoV-2 viral copies this assay can detect is 138 copies/mL. A negative result does not preclude SARS-Cov-2 infection and should not be used as the sole basis for treatment or other patient management decisions. A negative result may occur with  improper specimen collection/handling, submission of specimen other than nasopharyngeal swab, presence of viral mutation(s) within the areas targeted by this assay, and inadequate number of viral copies(<138 copies/mL). A negative result must be combined with clinical observations, patient history, and epidemiological information. The expected result is Negative.  Fact Sheet for Patients:  bloggercourse.com  Fact Sheet for Healthcare Providers:  seriousbroker.it  This test is no t yet approved or  cleared by the [place]  FDA and  has been authorized for detection and/or diagnosis of SARS-CoV-2 by FDA under an Emergency Use Authorization (EUA). This EUA will remain  in effect (meaning this test can be used) for the duration of the COVID-19 declaration under Section 564(b)(1) of the Act, 21 U.S.C.section 360bbb-3(b)(1), unless the authorization is terminated  or revoked sooner.       Influenza A by PCR NEGATIVE NEGATIVE Final   Influenza B by PCR NEGATIVE NEGATIVE Final    Comment: (NOTE) The Xpert Xpress SARS-CoV-2/FLU/RSV plus assay is intended as an aid in the diagnosis of influenza from Nasopharyngeal swab specimens and should not be used as a sole basis for treatment. Nasal washings and aspirates are unacceptable for Xpert Xpress SARS-CoV-2/FLU/RSV testing.  Fact Sheet for Patients: bloggercourse.com  Fact Sheet for Healthcare Providers: seriousbroker.it  This test is not yet approved or cleared by the [place]  FDA and has been authorized for detection and/or diagnosis of SARS-CoV-2 by FDA under an Emergency Use Authorization (EUA). This EUA will remain in effect (meaning this test can be used) for the duration of the COVID-19 declaration under Section 564(b)(1) of the Act, 21 U.S.C. section 360bbb-3(b)(1), unless the authorization is terminated or revoked.     Resp Syncytial Virus by PCR NEGATIVE NEGATIVE Final    Comment: (NOTE) Fact Sheet for Patients: bloggercourse.com  Fact Sheet for Healthcare Providers: seriousbroker.it  This test is not yet approved or cleared by the [place]  FDA and has been authorized for detection and/or diagnosis of SARS-CoV-2 by FDA under an Emergency Use Authorization (EUA). This EUA will remain in effect (meaning this test can be used) for the duration of the COVID-19 declaration under Section 564(b)(1) of the Act, 21  U.S.C. section 360bbb-3(b)(1), unless the authorization is terminated or revoked.  Performed at [facility], [address] [place], [place] [address]   Blood Culture (routine x 2)     Status: None (Preliminary result)   Collection Time: 12/13/23  4:16 PM   Specimen: BLOOD  Result Value Ref Range Status   Specimen Description BLOOD LEFT ANTECUBITAL  Final   Special Requests   Final    BOTTLES DRAWN AEROBIC AND ANAEROBIC Blood Culture adequate volume   Culture   Final    NO GROWTH 4 DAYS Performed at [facility], [address] [place], [place] [address]    Report Status PENDING  Incomplete  Blood Culture (routine x 2)     Status: None (Preliminary result)   Collection Time: 12/13/23  4:16 PM   Specimen: BLOOD  Result Value Ref Range Status   Specimen Description BLOOD RIGHT ANTECUBITAL  Final   Special Requests   Final    BOTTLES DRAWN AEROBIC AND ANAEROBIC Blood Culture adequate volume   Culture   Final    NO GROWTH 4 DAYS Performed at [facility], [address], [place], [place] [address]    Report Status PENDING  Incomplete  Urine Culture (for pregnant, neutropenic or urologic patients or patients with an indwelling urinary catheter)     Status: Abnormal (Preliminary result)   Collection Time: 12/13/23  5:10 PM   Specimen: Urine, Clean Catch  Result Value Ref Range Status   Specimen Description   Final    URINE, CLEAN CATCH Performed at [facility], [address], [place], [place] [address]    Special Requests   Final    NONE Performed at [facility], [address] [place], [place] [address]    Culture (A)  Final    >=100,000 COLONIES/mL ESCHERICHIA COLI CONFIRMATION OF SUSCEPTIBILITIES IN PROGRESS Performed at [facility], [address] [place], [place] [address]    Report Status PENDING  Incomplete  MRSA Next Gen by PCR, Nasal     Status: Abnormal   Collection Time: 12/14/23  7:20 AM    Specimen: Nasal Mucosa; Nasal Swab  Result Value Ref Range Status   MRSA by PCR Next Gen DETECTED (A) NOT DETECTED Final    Comment: RESULT CALLED TO, READ BACK BY AND VERIFIED WITH: ALEX JONES 12/14/23 1005 MW (NOTE) The GeneXpert MRSA Assay (FDA approved for NASAL specimens only), is one component of a comprehensive MRSA colonization surveillance program. It is not intended to diagnose MRSA infection nor to guide or monitor treatment for MRSA infections. Test performance is not FDA approved in patients less than 100 years old. Performed at [facility], [address] [place], [place] [address]   Respiratory (~20 pathogens) panel by PCR     Status: None   Collection Time: 12/15/23  2:33 PM   Specimen: Nasopharyngeal Swab; Respiratory  Result Value Ref Range Status   Adenovirus NOT DETECTED NOT DETECTED Final   Coronavirus 229E NOT DETECTED NOT DETECTED Final    Comment: (NOTE) The Coronavirus on the Respiratory Panel, DOES NOT test for the novel  Coronavirus (2019 nCoV)    Coronavirus HKU1 NOT DETECTED NOT DETECTED Final   Coronavirus NL63 NOT DETECTED NOT DETECTED Final   Coronavirus OC43 NOT DETECTED NOT DETECTED Final   Metapneumovirus NOT DETECTED NOT DETECTED Final   Rhinovirus / Enterovirus NOT DETECTED NOT DETECTED Final   Influenza A NOT DETECTED NOT DETECTED Final   Influenza B NOT DETECTED NOT DETECTED Final   Parainfluenza Virus 1 NOT DETECTED NOT DETECTED Final   Parainfluenza Virus 2 NOT DETECTED NOT DETECTED Final   Parainfluenza Virus 3 NOT DETECTED NOT DETECTED Final   Parainfluenza Virus 4 NOT DETECTED NOT DETECTED Final   Respiratory Syncytial Virus NOT DETECTED NOT DETECTED Final   Bordetella pertussis NOT DETECTED NOT DETECTED Final   Bordetella Parapertussis NOT DETECTED NOT DETECTED Final   Chlamydophila pneumoniae NOT DETECTED NOT DETECTED Final   Mycoplasma pneumoniae NOT DETECTED NOT DETECTED Final    Comment: Performed at [facility], [address] [place], [place] [address]  Radiology Studies: MR BRAIN WO CONTRAST Result Date: 12/16/2023 EXAM: MRI BRAIN WITHOUT CONTRAST 12/15/2023 10:00:00 PM TECHNIQUE: Multiplanar multisequence MRI of the head/brain was performed without the administration of intravenous contrast. COMPARISON: CT head 12/13/2023, earlier CT head studies, and cervical spine CT 05/29/2023. CLINICAL HISTORY: 89 year old female with abrupt change in functional status. FINDINGS: BRAIN AND VENTRICLES: No acute infarct. No intracranial hemorrhage. No mass. No midline shift. No hydrocephalus. The sella is unremarkable. Normal flow voids. Portuosity of the major vascular flow voids. Distal left vertebral artery appears to be dominant, normal variant. Stable brain volume. Patchy and confluent bilateral cerebral white matter T2 and FLAIR hyperintensity with extension into the deep white matter capsules. No cortical encephalomalacia or chronic cerebral blood products identified. Comparatively mild T2 heterogeneity in the deep gray nuclei and brainstem. Negative cerebellum. ORBITS: No acute abnormality. SINUSES AND MASTOIDS: Small volume retained secretions in the nasopharynx on the right. Chronically opacified, partially sclerotic right middle ear and mastoids. Paranasal sinuses, left mastoids are well aerated. BONES AND SOFT TISSUES: Susceptibility artifact related to a large left posterolateral neck suboccipital object CT of the left upper neck of unclear etiology and significance (series 11 image 1). Normal marrow signal. Partially visible cervical spine degeneration including chronic spondylolisthesis. Chronic degenerative cervical spinal ankylosis noted on the comparison. IMPRESSION: 1. No acute intracranial abnormality. 2. Moderate for age cerebral white matter disease compatible with chronic small vessel disease. 3. Large chronic object of unknown etiology and significance in the left suboccipital neck  soft tissues. 4. 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 Electronically signed by: Helayne Hurst MD 12/16/2023 03:57 AM EST RP Workstation: [id number]        Scheduled Meds:  ALPRAZolam   0.5 mg Oral BID   donepezil   10 mg Oral QHS   enoxaparin  (LOVENOX ) injection  30 mg Subcutaneous QHS   feeding supplement  237 mL Oral BID BM   fluticasone  furoate-vilanterol  1 puff Inhalation Daily   furosemide   40 mg Intravenous Daily   loratadine   10 mg Oral Daily   memantine   10 mg Oral BID   multivitamin with minerals   Oral Daily   mupirocin  ointment  1 Application Nasal BID   nicotine   14 mg Transdermal Daily   pantoprazole   40 mg Oral BID   polyethylene glycol  17 g Oral Daily   predniSONE   40 mg Oral Q breakfast   simvastatin   10 mg Oral QHS   Continuous Infusions:  ceFEPime  (MAXIPIME ) IV 2 g (12/16/23 1601)     LOS: 4 days   Time spent 35 minutes   Cresencio Fairly, MD [facility]  If 7PM-7AM, please contact night-coverage [email or web address] 12/17/2023, 1:23 PM

## 2023-12-17 NOTE — Plan of Care (Signed)
°  Problem: Education: Goal: Ability to demonstrate management of disease process will improve Outcome: Progressing Goal: Ability to verbalize understanding of medication therapies will improve Outcome: Progressing Goal: Individualized Educational Video(s) Outcome: Progressing   Problem: Activity: Goal: Capacity to carry out activities will improve Outcome: Progressing   Problem: Cardiac: Goal: Ability to achieve and maintain adequate cardiopulmonary perfusion will improve Outcome: Progressing   Problem: Education: Goal: Knowledge of disease or condition will improve Outcome: Progressing Goal: Knowledge of the prescribed therapeutic regimen will improve Outcome: Progressing Goal: Individualized Educational Video(s) Outcome: Progressing   Problem: Activity: Goal: Ability to tolerate increased activity will improve Outcome: Progressing Goal: Will verbalize the importance of balancing activity with adequate rest periods Outcome: Progressing   Problem: Respiratory: Goal: Ability to maintain a clear airway will improve Outcome: Progressing Goal: Levels of oxygenation will improve Outcome: Progressing Goal: Ability to maintain adequate ventilation will improve Outcome: Progressing   Problem: Activity: Goal: Ability to tolerate increased activity will improve Outcome: Progressing   Problem: Clinical Measurements: Goal: Ability to maintain a body temperature in the normal range will improve Outcome: Progressing   Problem: Respiratory: Goal: Ability to maintain adequate ventilation will improve Outcome: Progressing Goal: Ability to maintain a clear airway will improve Outcome: Progressing   Problem: Education: Goal: Knowledge of General Education information will improve Description: Including pain rating scale, medication(s)/side effects and non-pharmacologic comfort measures Outcome: Progressing   Problem: Health Behavior/Discharge Planning: Goal: Ability to manage  health-related needs will improve Outcome: Progressing   Problem: Clinical Measurements: Goal: Ability to maintain clinical measurements within normal limits will improve Outcome: Progressing Goal: Will remain free from infection Outcome: Progressing Goal: Diagnostic test results will improve Outcome: Progressing Goal: Respiratory complications will improve Outcome: Progressing Goal: Cardiovascular complication will be avoided Outcome: Progressing   Problem: Activity: Goal: Risk for activity intolerance will decrease Outcome: Progressing   Problem: Nutrition: Goal: Adequate nutrition will be maintained Outcome: Progressing   Problem: Coping: Goal: Level of anxiety will decrease Outcome: Progressing   Problem: Elimination: Goal: Will not experience complications related to bowel motility Outcome: Progressing Goal: Will not experience complications related to urinary retention Outcome: Progressing   Problem: Pain Managment: Goal: General experience of comfort will improve and/or be controlled Outcome: Progressing   Problem: Safety: Goal: Ability to remain free from injury will improve Outcome: Progressing   Problem: Skin Integrity: Goal: Risk for impaired skin integrity will decrease Outcome: Progressing

## 2023-12-17 NOTE — Plan of Care (Signed)
 Collaborated with Saddie Na, RN-ACC hospice liaison as Ms. Eustache currently under [facility] hospice services in her home. Updates provided to Saddie Na, RN from visit had with Ms. Willingham and daughter yesterday. Saddie Na, RN plans to visit with Ms. Lippert and speak with daughter today-no acute palliative needs at this time. Encouraged Saddie to reach out if needs or concerns arise, will be available to provide assistance or support at anytime.  No Charge.  Waddell Lesches, DNP, AGNP-C Palliative Medicine  Please call Palliative Medicine team phone with any questions [phone]. For individual providers please see AMION.

## 2023-12-17 NOTE — Progress Notes (Signed)
 Physical Therapy Treatment Patient Details Name: CHAMYA HUNTON MRN: [id number] DOB: January 27, 1934 Today's Date: 12/17/2023   History of Present Illness Pt is a 89 y.o. female with medical history significant of dCHF,  HLD, COPD, MI, pulmonary fibrosis, dementia, CKD-3B, anxiety, anemia, chronic pain, IBS, chronic hypotension, under hospice, and gen weakness. MD assessment includes: acute on chronic CHF, MI, COPD, mass on left neck posteriorly.    PT Comments  Pt received in bed family at bedside. Discussed pt's current LOF and previous baseline with family. Educated pt and family regarding role of PT and realistic treatment frequency. Family responsive and appear to want the best for pt. Overall, pt did well with OOB activity to bedside chair. Increased assist needed for bed mobility and transfers compared to baseline. Unable to functionally progress gait training due to pt's fatigue level once positioned in upright sitting. 96% on RA upon exertion. Will continue to progress per POC and encourage OOB activity daily.     If plan is discharge home, recommend the following: A lot of help with walking and/or transfers;A lot of help with bathing/dressing/bathroom;Assist for transportation;Assistance with cooking/housework;Help with stairs or ramp for entrance   Can travel by private vehicle     No  Equipment Recommendations  None recommended by PT (TBD at next level of care.)    Recommendations for Other Services       Precautions / Restrictions Precautions Precautions: Fall Recall of Precautions/Restrictions: Impaired Restrictions Weight Bearing Restrictions Per Provider Order: No     Mobility  Bed Mobility Overal bed mobility: Needs Assistance Bed Mobility: Supine to Sit     Supine to sit: Mod assist, Used rails     General bed mobility comments: pt needed mod assist for LE negotion to EOB and trunk support. Pt needed max cueing for sequecing    Transfers Overall transfer level:  Needs assistance Equipment used: Rolling walker (2 wheels) Transfers: Sit to/from Stand, Bed to chair/wheelchair/BSC Sit to Stand: Mod assist, Min assist, +2 physical assistance   Step pivot transfers: Mod assist       General transfer comment:  (Mod/MinA of 2 to stand from bed and recliner)    Ambulation/Gait               General Gait Details:  (No true gait this date due to fatigue once transferring to bedside chair)   Stairs             Wheelchair Mobility     Tilt Bed    Modified Rankin (Stroke Patients Only)       Balance Overall balance assessment: Needs assistance Sitting-balance support: Feet supported, Single extremity supported Sitting balance-Leahy Scale: Fair     Standing balance support: Bilateral upper extremity supported, During functional activity, Reliant on assistive device for balance Standing balance-Leahy Scale: Poor Standing balance comment:  (ModA to maintain static standing at [employer])                            Communication Communication Communication: Impaired Factors Affecting Communication: Reduced clarity of speech;Hearing impaired  Cognition Arousal: Alert Behavior During Therapy: WFL for tasks assessed/performed   PT - Cognitive impairments: History of cognitive impairments, Awareness, Memory, Attention, Initiation, Sequencing, Problem solving, Safety/Judgement                       PT - Cognition Comments: Repeated cues to complete dimple tasks and answer perseverating questions Following  commands: Impaired Following commands impaired: Follows one step commands with increased time    Cueing Cueing Techniques: Verbal cues, Gestural cues, Tactile cues, Visual cues  Exercises      General Comments General comments (skin integrity, edema, etc.):  (Sore/red buttocks, 96% on RA, very kyphotic and frail)      Pertinent Vitals/Pain Pain Assessment Pain Assessment: Faces Faces Pain Scale: Hurts little  more Pain Location:  (Buttocks) Pain Descriptors / Indicators: Aching, Burning, Discomfort Pain Intervention(s): Repositioned    Home Living                          Prior Function            PT Goals (current goals can now be found in the care plan section) Progress towards PT goals: Progressing toward goals    Frequency    Min 2X/week      PT Plan      Co-evaluation              AM-PAC PT 6 Clicks Mobility   Outcome Measure  Help needed turning from your back to your side while in a flat bed without using bedrails?: A Lot Help needed moving from lying on your back to sitting on the side of a flat bed without using bedrails?: A Lot Help needed moving to and from a bed to a chair (including a wheelchair)?: A Lot Help needed standing up from a chair using your arms (e.g., wheelchair or bedside chair)?: A Lot Help needed to walk in hospital room?: Total Help needed climbing 3-5 steps with a railing? : Total 6 Click Score: 10    End of Session   Activity Tolerance: Patient limited by fatigue Patient left: in chair;with call bell/phone within reach;with chair alarm set;with family/visitor present Nurse Communication: Mobility status PT Visit Diagnosis: Unsteadiness on feet (R26.81);Difficulty in walking, not elsewhere classified (R26.2);Muscle weakness (generalized) (M62.81)     Time: 8469-8395 PT Time Calculation (min) (ACUTE ONLY): 34 min  Charges:    $Therapeutic Activity: 23-37 mins PT General Charges $$ ACUTE PT VISIT: 1 Visit                    Darice Bohr, PTA  Darice JAYSON Bohr 12/17/2023, 4:41 PM

## 2023-12-18 ENCOUNTER — Other Ambulatory Visit: Payer: Self-pay

## 2023-12-18 DIAGNOSIS — R531 Weakness: Secondary | ICD-10-CM | POA: Diagnosis not present

## 2023-12-18 DIAGNOSIS — F0390 Unspecified dementia without behavioral disturbance: Secondary | ICD-10-CM | POA: Diagnosis not present

## 2023-12-18 DIAGNOSIS — I5033 Acute on chronic diastolic (congestive) heart failure: Secondary | ICD-10-CM | POA: Diagnosis not present

## 2023-12-18 DIAGNOSIS — J9601 Acute respiratory failure with hypoxia: Secondary | ICD-10-CM

## 2023-12-18 LAB — CULTURE, BLOOD (ROUTINE X 2)
Culture: NO GROWTH
Culture: NO GROWTH
Special Requests: ADEQUATE
Special Requests: ADEQUATE

## 2023-12-18 LAB — URINE CULTURE: Culture: >=100000 — AB

## 2023-12-18 MED ORDER — METHENAMINE HIPPURATE 1 G PO TABS
1.0000 g | ORAL_TABLET | Freq: Two times a day (BID) | ORAL | 0 refills | Status: DC
  Filled 2023-12-18: qty 10, 5d supply, fill #0

## 2023-12-18 MED ORDER — PREDNISONE 20 MG PO TABS
20.0000 mg | ORAL_TABLET | Freq: Every day | ORAL | Status: DC
  Administered 2023-12-18: 20 mg via ORAL
  Filled 2023-12-18: qty 1

## 2023-12-18 MED ORDER — NITROFURANTOIN MONOHYD MACRO 100 MG PO CAPS
100.0000 mg | ORAL_CAPSULE | Freq: Two times a day (BID) | ORAL | 0 refills | Status: DC
  Filled 2023-12-18: qty 14, 7d supply, fill #0

## 2023-12-18 MED ORDER — FOSFOMYCIN TROMETHAMINE 3 G PO PACK
3.0000 g | PACK | Freq: Once | ORAL | Status: AC
  Administered 2023-12-18: 3 g via ORAL
  Filled 2023-12-18: qty 3

## 2023-12-18 MED ORDER — METHENAMINE HIPPURATE 1 G PO TABS
1.0000 g | ORAL_TABLET | Freq: Two times a day (BID) | ORAL | 0 refills | Status: AC
  Filled 2023-12-18 – 2023-12-19 (×2): qty 60, 30d supply, fill #0

## 2023-12-18 NOTE — TOC Transition Note (Signed)
 Transition of Care [facility]) - Discharge Note   Patient Details  Name: [name] MRN: [id number] Date of Birth: 07/17/1934  Transition [facility]) CM/SW Contact:  Victory Jackquline RAMAN, RN Phone Number: 12/18/2023, 11:01 AM   Clinical Narrative:  Patient discharging home with hospice care ([facility]). Daughter Noemi) notified of patient discharging home today. Transportation set up with [facility] and they will be here at 12 noon, bedside nurse made aware. No further concerns. RNCM signing off.    Final next level of care: Home w Hospice Care Barriers to Discharge: Barriers Resolved   Patient Goals and CMS Choice            Discharge Placement                Patient to be transferred to facility by: [facility] Name of family member notified: Daughter/ [name] Patient and family notified of of transfer: 12/18/23  Discharge Plan and Services Additional resources added to the After Visit Summary for                                       Social Drivers of Health (SDOH) Interventions SDOH Screenings   Food Insecurity: Patient Unable To Answer (12/14/2023)  Housing: Low Risk (11/11/2023)  Transportation Needs: No Transportation Needs (11/11/2023)  Utilities: Not At Risk (11/11/2023)  Financial Resource Strain: Low Risk  (08/11/2023)   Received from [facility]  Social Connections: Patient Declined (11/11/2023)  Tobacco Use: High Risk (12/13/2023)     Readmission Risk Interventions    11/29/2022    1:31 PM 07/26/2022   12:28 PM 09/30/2021    9:22 AM  Readmission Risk Prevention Plan  Transportation Screening Complete Complete Complete  PCP or Specialist Appt within 5-7 Days  Not Complete   Home Care Screening  Complete   Medication Review (RN CM)  Complete   Medication Review (RN Care Manager) Complete  Complete  HRI or Home Care Consult Complete  Complete  SW Recovery Care/Counseling Consult Complete  Complete  Palliative Care Screening Not  Applicable  Not Applicable  Skilled Nursing Facility Not Applicable  Complete

## 2023-12-18 NOTE — Plan of Care (Signed)
?  Problem: Education: ?Goal: Ability to demonstrate management of disease process will improve ?Outcome: Progressing ?  ?

## 2023-12-18 NOTE — Discharge Summary (Signed)
 Physician Discharge Summary   Patient: [name] MRN: [id number] DOB: [birth year]  Admit date:     12/13/2023  Discharge date: 12/18/2023  Discharge Physician: Cresencio Fairly   PCP: Lacey Ophelia [name], MD   Recommendations at discharge:   Outpatient follow-up by hospice at home  Discharge Diagnoses: Principal Problem:   Acute on chronic diastolic CHF (congestive heart failure) (HCC) Active Problems:   Myocardial injury   Leukocytosis   COPD (chronic obstructive pulmonary disease) (HCC)   Pulmonary fibrosis (HCC)   UTI (urinary tract infection)   Hypotension   Chronic kidney disease, stage 3b (HCC)   Hyperlipidemia   Chronic pain   Dementia without behavioral disturbance (HCC)   Depression with anxiety   Weakness  Hospital Course: Assessment and Plan:  89 y.o. female with medical history significant of dCHF,  HLD, COPD, pulmonary fibrosis, dementia, CKD-3B, anxiety, anemia, chronic pain, IBS, chronic hypotension, under hospice, who presents with weakness.     Patient has hx of dementia, and is unable to provide accurate medical history, therefore, most of the history is obtained by discussing the case with ED physician, per EMS report, and with the nursing staff.   Per report, pt has overall health is declining recently, not feeling well with generalized weakness. She is noted to be much weaker in the past 2 days.  She is no longer able to feed herself. She needs to be picked up and carried to be moved. Per EDP, when hospice nurse came today, pt is noted to have low blood pressure.  The family's concern for an infection, sepsis, or stroke.  Patient does not have any acute pain.  When I saw pt in ED, pt seems to know her own name, not orientated to place and time.  She moves all extremities.  No facial droop noted.  She seems to have difficulty breathing, no active cough.  No active nausea vomiting, diarrhea noted.  Not sure if patient has symptoms of UTI. Pt has a chronic mass in  left neck.   Initially patient had low blood pressure 78/56 which improved to SBP 110s after giving 1 L LR in ED.   Data reviewed independently and ED Course: pt was found to have BNP 6019, positive UA (cloudy appearance, moderate amount leukocyte, many bacteria, WBC> 50, squamous cell 11-20), troponin 34 --> 29, lactic acid 0.9, WBC 19.4, worsening renal function, negative PCR for COVID, flu and RSV.  Temperature 99.4, heart rate 80-90s, RR 21, oxygen saturation 99% on 2 L oxygen (not sure how much oxygen patient is using at home). Patient is admitted to PCU as inpatient.   Chest x-ray: Lower inspiratory volumes with significantly increased diffuse interstitial and airspace opacities bilaterally. Differential considerations include CHF versus atypical or viral pneumonia.   CT of head:  No acute intracranial abnormality. 2. Extensive chronic small vessel ischemic disease. 3. Chronic right mastoid and middle ear effusions.   12/13: Daughter still not quite understanding hospice and requesting PT and OT evaluation while in the hospital     [name]   FMW:[id number] DOB: [birth year] DOA: 12/13/2023 PCP: Lacey Ophelia [name], MD          Assessment & Plan:   Principal Problem:   Acute on chronic diastolic CHF (congestive heart failure) (HCC) Active Problems:   Myocardial injury   Leukocytosis   COPD (chronic obstructive pulmonary disease) (HCC)   Pulmonary fibrosis (HCC)   UTI (urinary tract infection)   Hypotension  Chronic kidney disease, stage 3b (HCC)   Hyperlipidemia   Chronic pain   Dementia without behavioral disturbance (HCC)   Depression with anxiety   Assessment and Plan:   Acute on chronic diastolic CHF: has trace leg edema, but has significantly elevated proBNP 6019, Continue Lasix  at discharge.  She is -1.5 L at discharge   Myocardial injury: Trop  34 --> 29.  Likely demand ischemia.   Stroke concern: family tells me there was an abrupt change in patient's  functional status, trouble swallowing, facial droop, that accompanied her initial presentation symptoms. Non-focal exam here and mri brain nothing acute.   acute bronchitis: no significant respiratory symptms. Possible ILD at recent pulm visit. Blood cxs NGTD. Rvp neg. Stable off oxygen.  Treated   ESBL E. coli UTI: urine cx growint 100k e coli. Wbc has normalized.  She is afebrile, treated with IV Maxipime  followed by 1 dose of oral fosfomycin  on the day of discharge.  She will have methenamine  1 g twice daily as prophylaxis at home.  Our community pharmacy will have this mailed by Tuesday/Wednesday next week.  Daughter is aware.   Hx of pulmonary fibrosis: continue w/ supportive care.  Treated with steroids taper  Hx of COPD: bronchodilators prn. Encourage incentive spirometry    Hypotension: chronic. Improved. D/c midodrine     CKDIIIb: stable   HLD: continue on statin    Chronic pain: Continue morphine    Dementia: Followed by hospice   Anxiety : severity unknown. Continue on home dose of xanax    Mass in left neck:  approximately 6-7 cm in size. Chronic issue. F/u with PCP as outpt   Hospice patient: Followed by a [facility] hospice       Disposition: Hospice care Diet recommendation:  Discharge Diet Orders (From admission, onward)     Start     Ordered   12/18/23 0000  Diet regular Room service appropriate? Yes; Fluid consistency: Thin        12/18/23 1038           Carb modified diet DISCHARGE MEDICATION: Allergies as of 12/18/2023       Reactions   Codeine Anaphylaxis, Shortness Of Breath, Rash   Neurontin [gabapentin] Anaphylaxis   Sulfonamide Derivatives Hives, Shortness Of Breath   Aloe Other (See Comments)   Unknown reaction   Levaquin [levofloxacin] Other (See Comments)   Pt admitted to hospital with lung problems due to this medication.   Ms Contin  [morphine ] Other (See Comments)   Patient's daughter reports hypoxia requiring O2 with morphine  and  similar opiates.  But does okay with tramadol .   Septra [sulfamethoxazole-trimethoprim] Hives   Sulfa Antibiotics Hives   Trimethoprim Hives   Vibra -tab [doxycycline ] Hives        Medication List     STOP taking these medications    acetaminophen  500 MG tablet Commonly known as: TYLENOL    aspirin  EC 81 MG tablet   cephALEXin  500 MG capsule Commonly known as: KEFLEX    CRANBERRY PO   memantine  10 MG tablet Commonly known as: NAMENDA    midodrine  5 MG tablet Commonly known as: PROAMATINE    oxyCODONE  5 MG immediate release tablet Commonly known as: Oxy IR/ROXICODONE    pregabalin  25 MG capsule Commonly known as: LYRICA    traMADol  50 MG tablet Commonly known as: ULTRAM    zolpidem  5 MG tablet Commonly known as: AMBIEN        TAKE these medications    albuterol  108 (90 Base) MCG/ACT inhaler Commonly known as: ProAir  HFA  Inhale 2 puffs into the lungs every 4 (four) hours as needed for wheezing or shortness of breath.   ALPRAZolam  0.5 MG tablet Commonly known as: XANAX  Take 1 tablet (0.5 mg total) by mouth 2 (two) times daily.   Breo Ellipta  100-25 MCG/ACT Aepb Generic drug: fluticasone  furoate-vilanterol Inhale 1 puff into the lungs daily.   diclofenac  Sodium 1 % Gel Commonly known as: VOLTAREN  APPLY AS DIRECTED TO THE AFFECTED KNEE FOUR TIMES DAILY.   donepezil  10 MG tablet Commonly known as: ARICEPT  Take 10 mg by mouth at bedtime.   furosemide  40 MG tablet Commonly known as: LASIX  Take 1 tablet (40 mg total) by mouth daily. With an extra 40 Mg tablet as needed for swelling, shortness of breath and weight gain.   ipratropium-albuterol  0.5-2.5 (3) MG/3ML Soln Commonly known as: DUONEB Take 3 mLs by nebulization in the morning, at noon, in the evening, and at bedtime.   Linzess  145 MCG Caps capsule Generic drug: linaclotide  Take 145 mcg by mouth daily as needed (constipation).   loratadine  10 MG tablet Commonly known as: CLARITIN  Take 10 mg by  mouth daily.   methenamine  1 g tablet Commonly known as: HIPREX  Take 1 tablet (1 g total) by mouth 2 (two) times daily with a meal.   morphine  CONCENTRATE 10 mg / 0.5 ml concentrated solution :0.25 Milliliter(s) By Mouth Every 2 Hours PRN   ondansetron  4 MG disintegrating tablet Commonly known as: ZOFRAN -ODT Take 4 mg by mouth every 4 (four) hours as needed.   ONE A DAY WOMEN 50 PLUS PO Take 1 tablet by mouth daily.   pantoprazole  40 MG tablet Commonly known as: PROTONIX  TAKE (1) TABLET BY MOUTH TWICE A DAY WITH MEALS (BREAKFAST AND SUPPER)   potassium chloride  10 MEQ tablet Commonly known as: KLOR-CON  Take 1 tablet (10 mEq total) by mouth daily. When taking extra lasix .   simvastatin  10 MG tablet Commonly known as: ZOCOR  Take 10 mg by mouth at bedtime.        Follow-up Information     Lacey Ophelia PARAS III, MD. Schedule an appointment as soon as possible for a visit in 1 week(s).   Specialty: Internal Medicine Why: [facility] Discharge F/UP Contact information: [address] [place] [address] [phone]                Discharge Exam: Fredricka Weights   12/16/23 0500 12/17/23 0500 12/18/23 0500  Weight: 52 kg 50.2 kg 49 kg   General exam: appears cachectic and chronically ill Respiratory system: decreased breath sounds b/l, scattered rales. Normal wob Cardiovascular system: S1 & S2+.  Gastrointestinal system: Abdomen is soft, benign Central nervous system: Alert and awake,. Moves all 4, decreased strength throughout Psychiatry: calm, pleasantly confused  Condition at discharge: poor  The results of significant diagnostics from this hospitalization (including imaging, microbiology, ancillary and laboratory) are listed below for reference.   Imaging Studies: MR BRAIN WO CONTRAST Result Date: 12/16/2023 EXAM: MRI BRAIN WITHOUT CONTRAST 12/15/2023 10:00:00 PM TECHNIQUE: Multiplanar multisequence MRI of the head/brain was  performed without the administration of intravenous contrast. COMPARISON: CT head 12/13/2023, earlier CT head studies, and cervical spine CT 05/29/2023. CLINICAL HISTORY: 89 year old female with abrupt change in functional status. FINDINGS: BRAIN AND VENTRICLES: No acute infarct. No intracranial hemorrhage. No mass. No midline shift. No hydrocephalus. The sella is unremarkable. Normal flow voids. Portuosity of the major vascular flow voids. Distal left vertebral artery appears to be dominant, normal variant. Stable  brain volume. Patchy and confluent bilateral cerebral white matter T2 and FLAIR hyperintensity with extension into the deep white matter capsules. No cortical encephalomalacia or chronic cerebral blood products identified. Comparatively mild T2 heterogeneity in the deep gray nuclei and brainstem. Negative cerebellum. ORBITS: No acute abnormality. SINUSES AND MASTOIDS: Small volume retained secretions in the nasopharynx on the right. Chronically opacified, partially sclerotic right middle ear and mastoids. Paranasal sinuses, left mastoids are well aerated. BONES AND SOFT TISSUES: Susceptibility artifact related to a large left posterolateral neck suboccipital object CT of the left upper neck of unclear etiology and significance (series 11 image 1). Normal marrow signal. Partially visible cervical spine degeneration including chronic spondylolisthesis. Chronic degenerative cervical spinal ankylosis noted on the comparison. IMPRESSION: 1. No acute intracranial abnormality. 2. Moderate for age cerebral white matter disease compatible with chronic small vessel disease. 3. Large chronic object of unknown etiology and significance in the left suboccipital neck soft tissues. 4. 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 Electronically signed by: Helayne Hurst MD 12/16/2023 03:57 AM EST RP Workstation: [id number]   ECHOCARDIOGRAM COMPLETE Result Date: 12/14/2023    ECHOCARDIOGRAM REPORT    Patient Name:   [name] Date of Exam: 12/14/2023 Medical Rec #:  [id number]     Height:       61.0 in Accession #:    [id number]    Weight:       115.1 lb Date of Birth:  08-27-1934     BSA:          1.493 m Patient Age:    89 years      BP:           100/60 mmHg Patient Gender: F             HR:           94 bpm. Exam Location:  [facility] Procedure: 2D Echo, Cardiac Doppler and Color Doppler (Both Spectral and Color            Flow Doppler were utilized during procedure). Indications:     CHF---acute diastolic I50.31  History:         Patient has prior history of Echocardiogram examinations, most                  recent 04/24/2022. COPD; Risk Factors:Dyslipidemia. Pulmonary                  fibrosis.  Sonographer:     Christopher Furnace Referring Phys:  [id number] XILIN NIU Diagnosing Phys: Evalene Lunger MD  Sonographer Comments: Image acquisition challenging due to patient body habitus and Image acquisition challenging due to COPD. IMPRESSIONS  1. Left ventricular ejection fraction, by estimation, is 60 to 65%. The left ventricle has normal function. The left ventricle has no regional wall motion abnormalities. Left ventricular diastolic parameters are consistent with Grade I diastolic dysfunction (impaired relaxation).  2. Right ventricular systolic function is normal. The right ventricular size is normal. There is normal pulmonary artery systolic pressure. The estimated right ventricular systolic pressure is 33.9 mmHg.  3. Left atrial size was mildly dilated.  4. The mitral valve is normal in structure. Mild mitral valve regurgitation. No evidence of mitral stenosis.  5. Tricuspid valve regurgitation is mild to moderate.  6. The aortic valve is normal in structure. There is mild calcification of the aortic valve. Aortic valve regurgitation is not visualized. Aortic valve sclerosis/calcification is present, without any evidence of aortic stenosis. Aortic valve mean gradient  measures 2.0 mmHg.  7. The inferior vena cava is  normal in size with greater than 50% respiratory variability, suggesting right atrial pressure of 3 mmHg. FINDINGS  Left Ventricle: Left ventricular ejection fraction, by estimation, is 60 to 65%. The left ventricle has normal function. The left ventricle has no regional wall motion abnormalities. Strain was performed and the global longitudinal strain is indeterminate. The left ventricular internal cavity size was normal in size. There is no left ventricular hypertrophy. Left ventricular diastolic parameters are consistent with Grade I diastolic dysfunction (impaired relaxation). Right Ventricle: The right ventricular size is normal. No increase in right ventricular wall thickness. Right ventricular systolic function is normal. There is normal pulmonary artery systolic pressure. The tricuspid regurgitant velocity is 2.69 m/s, and  with an assumed right atrial pressure of 5 mmHg, the estimated right ventricular systolic pressure is 33.9 mmHg. Left Atrium: Left atrial size was mildly dilated. Right Atrium: Right atrial size was normal in size. Pericardium: There is no evidence of pericardial effusion. Mitral Valve: The mitral valve is normal in structure. There is mild calcification of the mitral valve leaflet(s). Mild mitral valve regurgitation. No evidence of mitral valve stenosis. MV peak gradient, 4.4 mmHg. The mean mitral valve gradient is 2.0 mmHg. Tricuspid Valve: The tricuspid valve is normal in structure. Tricuspid valve regurgitation is mild to moderate. No evidence of tricuspid stenosis. Aortic Valve: The aortic valve is normal in structure. There is mild calcification of the aortic valve. Aortic valve regurgitation is not visualized. Aortic valve sclerosis/calcification is present, without any evidence of aortic stenosis. Aortic valve mean gradient measures 2.0 mmHg. Aortic valve peak gradient measures 5.0 mmHg. Aortic valve area, by VTI measures 3.88 cm. Pulmonic Valve: The pulmonic valve was normal in  structure. Pulmonic valve regurgitation is not visualized. No evidence of pulmonic stenosis. Aorta: The aortic root is normal in size and structure. Venous: The inferior vena cava is normal in size with greater than 50% respiratory variability, suggesting right atrial pressure of 3 mmHg. IAS/Shunts: No atrial level shunt detected by color flow Doppler. Additional Comments: 3D was performed not requiring image post processing on an independent workstation and was indeterminate.  LEFT VENTRICLE PLAX 2D LVIDd:         3.30 cm   Diastology LVIDs:         2.20 cm   LV e' medial:    5.11 cm/s LV PW:         1.00 cm   LV E/e' medial:  15.5 LV IVS:        0.90 cm   LV e' lateral:   4.46 cm/s LVOT diam:     2.10 cm   LV E/e' lateral: 17.8 LV SV:         58 LV SV Index:   39 LVOT Area:     3.46 cm LV IVRT:       77 msec  RIGHT VENTRICLE RV Basal diam:  3.10 cm RV Mid diam:    2.10 cm RV S prime:     6.85 cm/s TAPSE (M-mode): 1.5 cm LEFT ATRIUM           Index        RIGHT ATRIUM          Index LA diam:      2.20 cm 1.47 cm/m   RA Area:     8.54 cm LA Vol (A2C): 49.2 ml 32.95 ml/m  RA Volume:   17.40 ml 11.65 ml/m  LA Vol (A4C): 41.1 ml 27.52 ml/m  AORTIC VALVE AV Area (Vmax):    2.79 cm AV Area (Vmean):   3.14 cm AV Area (VTI):     3.88 cm AV Vmax:           112.00 cm/s AV Vmean:          67.800 cm/s AV VTI:            0.150 m AV Peak Grad:      5.0 mmHg AV Mean Grad:      2.0 mmHg LVOT Vmax:         90.10 cm/s LVOT Vmean:        61.400 cm/s LVOT VTI:          0.168 m LVOT/AV VTI ratio: 1.12  AORTA Ao Root diam: 3.30 cm MITRAL VALVE                TRICUSPID VALVE MV Area (PHT): 2.31 cm     TR Peak grad:   28.9 mmHg MV Area VTI:   2.21 cm     TR Vmax:        269.00 cm/s MV Peak grad:  4.4 mmHg MV Mean grad:  2.0 mmHg     SHUNTS MV Vmax:       1.05 m/s     Systemic VTI:  0.17 m MV Vmean:      67.8 cm/s    Systemic Diam: 2.10 cm MV Decel Time: 329 msec MV E velocity: 79.30 cm/s MV A velocity: 107.00 cm/s MV E/A ratio:   0.74 Evalene Lunger MD Electronically signed by Evalene Lunger MD Signature Date/Time: 12/14/2023/3:35:18 PM    Final    DG Chest Port 1 View Result Date: 12/13/2023 CLINICAL DATA:  Possible sepsis EXAM: PORTABLE CHEST 1 VIEW COMPARISON:  Prior chest CT 11/27/2022; prior chest x-ray 11/10/2023 FINDINGS: Lower inspiratory volumes with significantly increased diffuse interstitial and airspace opacities bilaterally. Stable cardiomegaly and large hiatal hernia. Very limited chest x-ray. No acute osseous abnormality. IMPRESSION: Lower inspiratory volumes with significantly increased diffuse interstitial and airspace opacities bilaterally. Differential considerations include CHF versus atypical or viral pneumonia. Electronically Signed   By: Wilkie Lent M.D.   On: 12/13/2023 16:25   CT Head Wo Contrast Result Date: 12/13/2023 EXAM: CT HEAD WITHOUT 12/13/2023 03:43:45 PM TECHNIQUE: CT of the head was performed without the administration of intravenous contrast. Automated exposure control, iterative reconstruction, and/or weight based adjustment of the mA/kV was utilized to reduce the radiation dose to as low as reasonably achievable. COMPARISON: Head CT 10/14/2023. CLINICAL HISTORY: Neuro deficit, acute, stroke suspected. FINDINGS: BRAIN AND VENTRICLES: There is no evidence of an acute infarct, acute intracranial hemorrhage, midline shift, hydrocephalus, or extra-axial fluid collection. There is mild cerebral atrophy. Confluent hypodensities in the cerebral white matter bilaterally are similar to the prior CT and nonspecific but compatible with extensive chronic small vessel ischemic disease. A subcentimeter hyperdense focus/possible calcification in the posterior right frontal white matter is partially obscured by streak artifact but is unchanged from multiple prior examinations. Calcified atherosclerosis at the skull base. ORBITS: Right cataract extraction. SINUSES AND MASTOIDS: Persistent complete  opacification of the right mastoid air cells and right middle ear cavity. Well aerated paranasal sinuses. SOFT TISSUES AND SKULL: No acute skull fracture. No acute soft tissue abnormality. IMPRESSION: 1. No acute intracranial abnormality. 2. Extensive chronic small vessel ischemic disease. 3. Chronic right mastoid and middle ear effusions. Electronically signed by: Dasie Hamburg MD 12/13/2023 04:15  PM EST RP Workstation: [id number]    Microbiology: Results for orders placed or performed during the hospital encounter of 12/13/23  Resp panel by RT-PCR (RSV, Flu A&B, Covid) Anterior Nasal Swab     Status: None   Collection Time: 12/13/23 12:32 AM   Specimen: Anterior Nasal Swab  Result Value Ref Range Status   SARS Coronavirus 2 by RT PCR NEGATIVE NEGATIVE Final    Comment: (NOTE) SARS-CoV-2 target nucleic acids are NOT DETECTED.  The SARS-CoV-2 RNA is generally detectable in upper respiratory specimens during the acute phase of infection. The lowest concentration of SARS-CoV-2 viral copies this assay can detect is 138 copies/mL. A negative result does not preclude SARS-Cov-2 infection and should not be used as the sole basis for treatment or other patient management decisions. A negative result may occur with  improper specimen collection/handling, submission of specimen other than nasopharyngeal swab, presence of viral mutation(s) within the areas targeted by this assay, and inadequate number of viral copies(<138 copies/mL). A negative result must be combined with clinical observations, patient history, and epidemiological information. The expected result is Negative.  Fact Sheet for Patients:  bloggercourse.com  Fact Sheet for Healthcare Providers:  seriousbroker.it  This test is no t yet approved or cleared by the [place]  FDA and  has been authorized for detection and/or diagnosis of SARS-CoV-2 by FDA under an Emergency Use  Authorization (EUA). This EUA will remain  in effect (meaning this test can be used) for the duration of the COVID-19 declaration under Section 564(b)(1) of the Act, 21 U.S.C.section 360bbb-3(b)(1), unless the authorization is terminated  or revoked sooner.       Influenza A by PCR NEGATIVE NEGATIVE Final   Influenza B by PCR NEGATIVE NEGATIVE Final    Comment: (NOTE) The Xpert Xpress SARS-CoV-2/FLU/RSV plus assay is intended as an aid in the diagnosis of influenza from Nasopharyngeal swab specimens and should not be used as a sole basis for treatment. Nasal washings and aspirates are unacceptable for Xpert Xpress SARS-CoV-2/FLU/RSV testing.  Fact Sheet for Patients: bloggercourse.com  Fact Sheet for Healthcare Providers: seriousbroker.it  This test is not yet approved or cleared by the [place]  FDA and has been authorized for detection and/or diagnosis of SARS-CoV-2 by FDA under an Emergency Use Authorization (EUA). This EUA will remain in effect (meaning this test can be used) for the duration of the COVID-19 declaration under Section 564(b)(1) of the Act, 21 U.S.C. section 360bbb-3(b)(1), unless the authorization is terminated or revoked.     Resp Syncytial Virus by PCR NEGATIVE NEGATIVE Final    Comment: (NOTE) Fact Sheet for Patients: bloggercourse.com  Fact Sheet for Healthcare Providers: seriousbroker.it  This test is not yet approved or cleared by the [place]  FDA and has been authorized for detection and/or diagnosis of SARS-CoV-2 by FDA under an Emergency Use Authorization (EUA). This EUA will remain in effect (meaning this test can be used) for the duration of the COVID-19 declaration under Section 564(b)(1) of the Act, 21 U.S.C. section 360bbb-3(b)(1), unless the authorization is terminated or revoked.  Performed at [facility], [address], [place], [place] [address]   Blood Culture (routine x 2)     Status: None   Collection Time: 12/13/23  4:16 PM   Specimen: BLOOD  Result Value Ref Range Status   Specimen Description BLOOD LEFT ANTECUBITAL  Final   Special Requests   Final    BOTTLES DRAWN AEROBIC AND ANAEROBIC Blood Culture adequate volume  Culture   Final    NO GROWTH 5 DAYS Performed at [facility], [address] [place] [place], [place] [address]    Report Status 12/18/2023 FINAL  Final  Blood Culture (routine x 2)     Status: None   Collection Time: 12/13/23  4:16 PM   Specimen: BLOOD  Result Value Ref Range Status   Specimen Description BLOOD RIGHT ANTECUBITAL  Final   Special Requests   Final    BOTTLES DRAWN AEROBIC AND ANAEROBIC Blood Culture adequate volume   Culture   Final    NO GROWTH 5 DAYS Performed at [facility], [address] [place], [place] [address]    Report Status 12/18/2023 FINAL  Final  Urine Culture (for pregnant, neutropenic or urologic patients or patients with an indwelling urinary catheter)     Status: Abnormal   Collection Time: 12/13/23  5:10 PM   Specimen: Urine, Clean Catch  Result Value Ref Range Status   Specimen Description   Final    URINE, CLEAN CATCH Performed at [facility], [address], [place], [place] [address]    Special Requests   Final    NONE Performed at [facility], [address] [place], [place] [address]    Culture (A)  Final    >=100,000 COLONIES/mL ESCHERICHIA COLI Confirmed Extended Spectrum Beta-Lactamase Producer (ESBL).  In bloodstream infections from ESBL organisms, carbapenems are preferred over piperacillin/tazobactam. They are shown to have a lower risk of mortality.    Report Status 12/18/2023 FINAL  Final   Organism ID, Bacteria ESCHERICHIA COLI (A)  Final      Susceptibility   Escherichia coli - MIC*    AMPICILLIN >=32 RESISTANT Resistant     CEFAZOLIN  (URINE) Value in next row Resistant       >=32 RESISTANTThis is a modified FDA-approved test that has been validated and its performance characteristics determined by the reporting laboratory.  This laboratory is certified under the Clinical Laboratory Improvement Amendments CLIA as qualified to perform high complexity clinical laboratory testing.    CEFEPIME  Value in next row Resistant      >=32 RESISTANTThis is a modified FDA-approved test that has been validated and its performance characteristics determined by the reporting laboratory.  This laboratory is certified under the Clinical Laboratory Improvement Amendments CLIA as qualified to perform high complexity clinical laboratory testing.    ERTAPENEM Value in next row Sensitive      >=32 RESISTANTThis is a modified FDA-approved test that has been validated and its performance characteristics determined by the reporting laboratory.  This laboratory is certified under the Clinical Laboratory Improvement Amendments CLIA as qualified to perform high complexity clinical laboratory testing.    CEFTRIAXONE  Value in next row Resistant      >=32 RESISTANTThis is a modified FDA-approved test that has been validated and its performance characteristics determined by the reporting laboratory.  This laboratory is certified under the Clinical Laboratory Improvement Amendments CLIA as qualified to perform high complexity clinical laboratory testing.    CIPROFLOXACIN Value in next row Resistant      >=32 RESISTANTThis is a modified FDA-approved test that has been validated and its performance characteristics determined by the reporting laboratory.  This laboratory is certified under the Clinical Laboratory Improvement Amendments CLIA as qualified to perform high complexity clinical laboratory testing.    GENTAMICIN Value in next row Sensitive      >=32 RESISTANTThis is a modified FDA-approved test that has been validated and its  performance characteristics determined by the reporting laboratory.  This  laboratory is certified under the Clinical Laboratory Improvement Amendments CLIA as qualified to perform high complexity clinical laboratory testing.    NITROFURANTOIN  Value in next row Sensitive      >=32 RESISTANTThis is a modified FDA-approved test that has been validated and its performance characteristics determined by the reporting laboratory.  This laboratory is certified under the Clinical Laboratory Improvement Amendments CLIA as qualified to perform high complexity clinical laboratory testing.    TRIMETH/SULFA Value in next row Sensitive      >=32 RESISTANTThis is a modified FDA-approved test that has been validated and its performance characteristics determined by the reporting laboratory.  This laboratory is certified under the Clinical Laboratory Improvement Amendments CLIA as qualified to perform high complexity clinical laboratory testing.    AMPICILLIN/SULBACTAM Value in next row Resistant      >=32 RESISTANTThis is a modified FDA-approved test that has been validated and its performance characteristics determined by the reporting laboratory.  This laboratory is certified under the Clinical Laboratory Improvement Amendments CLIA as qualified to perform high complexity clinical laboratory testing.    PIP/TAZO Value in next row Intermediate      64 INTERMEDIATEThis is a modified FDA-approved test that has been validated and its performance characteristics determined by the reporting laboratory.  This laboratory is certified under the Clinical Laboratory Improvement Amendments CLIA as qualified to perform high complexity clinical laboratory testing.    MEROPENEM Value in next row Sensitive      64 INTERMEDIATEThis is a modified FDA-approved test that has been validated and its performance characteristics determined by the reporting laboratory.  This laboratory is certified under the Clinical Laboratory Improvement Amendments CLIA as qualified to perform high complexity clinical laboratory  testing.    * >=100,000 COLONIES/mL ESCHERICHIA COLI  MRSA Next Gen by PCR, Nasal     Status: Abnormal   Collection Time: 12/14/23  7:20 AM   Specimen: Nasal Mucosa; Nasal Swab  Result Value Ref Range Status   MRSA by PCR Next Gen DETECTED (A) NOT DETECTED Final    Comment: RESULT CALLED TO, READ BACK BY AND VERIFIED WITH: ALEX JONES 12/14/23 1005 MW (NOTE) The GeneXpert MRSA Assay (FDA approved for NASAL specimens only), is one component of a comprehensive MRSA colonization surveillance program. It is not intended to diagnose MRSA infection nor to guide or monitor treatment for MRSA infections. Test performance is not FDA approved in patients less than 60 years old. Performed at [facility], [address] [place], [place] [address]   Respiratory (~20 pathogens) panel by PCR     Status: None   Collection Time: 12/15/23  2:33 PM   Specimen: Nasopharyngeal Swab; Respiratory  Result Value Ref Range Status   Adenovirus NOT DETECTED NOT DETECTED Final   Coronavirus 229E NOT DETECTED NOT DETECTED Final    Comment: (NOTE) The Coronavirus on the Respiratory Panel, DOES NOT test for the novel  Coronavirus (2019 nCoV)    Coronavirus HKU1 NOT DETECTED NOT DETECTED Final   Coronavirus NL63 NOT DETECTED NOT DETECTED Final   Coronavirus OC43 NOT DETECTED NOT DETECTED Final   Metapneumovirus NOT DETECTED NOT DETECTED Final   Rhinovirus / Enterovirus NOT DETECTED NOT DETECTED Final   Influenza A NOT DETECTED NOT DETECTED Final   Influenza B NOT DETECTED NOT DETECTED Final   Parainfluenza Virus 1 NOT DETECTED NOT DETECTED Final   Parainfluenza Virus 2 NOT DETECTED NOT DETECTED Final   Parainfluenza Virus  3 NOT DETECTED NOT DETECTED Final   Parainfluenza Virus 4 NOT DETECTED NOT DETECTED Final   Respiratory Syncytial Virus NOT DETECTED NOT DETECTED Final   Bordetella pertussis NOT DETECTED NOT DETECTED Final   Bordetella Parapertussis NOT DETECTED NOT DETECTED Final    Chlamydophila pneumoniae NOT DETECTED NOT DETECTED Final   Mycoplasma pneumoniae NOT DETECTED NOT DETECTED Final    Comment: Performed at [facility], [address] [place], [place] [address]    Labs: CBC: Recent Labs  Lab 12/13/23 1433 12/14/23 0508 12/15/23 0433 12/16/23 0412  WBC 19.4* 12.0* 13.2* 9.2  HGB 10.4* 9.7* 9.9* 10.8*  HCT 32.1* 29.3* 30.2* 33.4*  MCV 87.0 86.4 87.3 86.8  PLT 218 197 217 251   Basic Metabolic Panel: Recent Labs  Lab 12/13/23 1433 12/13/23 1615 12/14/23 0508 12/15/23 0433 12/16/23 0412  NA 130*  --  131* 130* 135  K 4.8  --  4.5 4.1 4.6  CL 93*  --  95* 93* 97*  CO2 21*  --  23 24 25   GLUCOSE 81  --  93 103* 120*  BUN 34*  --  33* 35* 37*  CREATININE 1.49*  --  1.39* 1.36* 1.29*  CALCIUM  8.6*  --  8.4* 8.4* 8.8*  MG  --  2.1  --   --   --    Liver Function Tests: No results for input(s): AST, ALT, ALKPHOS, BILITOT, PROT, ALBUMIN  in the last 168 hours. CBG: No results for input(s): GLUCAP in the last 168 hours.  Discharge time spent: greater than 30 minutes.  Signed: Cresencio Fairly, MD [facility] 12/18/2023

## 2023-12-19 ENCOUNTER — Other Ambulatory Visit: Payer: Self-pay

## 2023-12-20 ENCOUNTER — Ambulatory Visit: Admitting: Orthopedic Surgery

## 2024-01-03 ENCOUNTER — Ambulatory Visit: Admitting: Cardiology

## 2024-01-06 ENCOUNTER — Ambulatory Visit: Admitting: Orthopedic Surgery

## 2024-01-08 ENCOUNTER — Other Ambulatory Visit: Payer: Self-pay | Admitting: Cardiology

## 2024-01-19 ENCOUNTER — Other Ambulatory Visit: Payer: Self-pay | Admitting: Cardiology
